# Patient Record
Sex: Female | Born: 1975
Health system: Southern US, Community
[De-identification: ages and names within clinical notes are randomized; demographics above are authoritative.]

## PROBLEM LIST (undated history)

## (undated) ENCOUNTER — Inpatient Hospital Stay (HOSPITAL_COMMUNITY): Payer: Self-pay

## (undated) DIAGNOSIS — I615 Nontraumatic intracerebral hemorrhage, intraventricular: Secondary | ICD-10-CM

## (undated) DIAGNOSIS — G40901 Epilepsy, unspecified, not intractable, with status epilepticus: Secondary | ICD-10-CM

## (undated) DIAGNOSIS — I639 Cerebral infarction, unspecified: Secondary | ICD-10-CM

## (undated) DIAGNOSIS — G08 Intracranial and intraspinal phlebitis and thrombophlebitis: Secondary | ICD-10-CM

## (undated) DIAGNOSIS — I619 Nontraumatic intracerebral hemorrhage, unspecified: Secondary | ICD-10-CM

## (undated) DIAGNOSIS — R569 Unspecified convulsions: Secondary | ICD-10-CM

## (undated) DIAGNOSIS — F419 Anxiety disorder, unspecified: Secondary | ICD-10-CM

## (undated) DIAGNOSIS — F329 Major depressive disorder, single episode, unspecified: Secondary | ICD-10-CM

## (undated) DIAGNOSIS — G936 Cerebral edema: Secondary | ICD-10-CM

## (undated) HISTORY — DX: Nontraumatic intracerebral hemorrhage, intraventricular: I61.5

## (undated) HISTORY — DX: Anxiety disorder, unspecified: F41.9

## (undated) HISTORY — DX: Cerebral infarction, unspecified: I63.9

## (undated) HISTORY — DX: Unspecified convulsions: R56.9

## (undated) HISTORY — DX: Major depressive disorder, single episode, unspecified: F32.9

## (undated) HISTORY — DX: Cerebral edema: G93.6

## (undated) HISTORY — DX: Epilepsy, unspecified, not intractable, with status epilepticus: G40.901

## (undated) HISTORY — DX: Intracranial and intraspinal phlebitis and thrombophlebitis: G08

## (undated) HISTORY — PX: DILATION AND CURETTAGE OF UTERUS: SHX78

## (undated) HISTORY — PX: COLONOSCOPY: SHX174

---

## 2013-01-06 ENCOUNTER — Emergency Department (HOSPITAL_COMMUNITY)
Admission: EM | Admit: 2013-01-06 | Discharge: 2013-01-07 | Disposition: A | Discharge status: Home / Self Care | Payer: Managed Care, Other (non HMO) | Attending: Emergency Medicine | Admitting: Emergency Medicine

## 2013-01-06 ENCOUNTER — Encounter (HOSPITAL_COMMUNITY): Payer: Self-pay | Admitting: Emergency Medicine

## 2013-01-06 DIAGNOSIS — Y92410 Unspecified street and highway as the place of occurrence of the external cause: Secondary | ICD-10-CM | POA: Insufficient documentation

## 2013-01-06 DIAGNOSIS — IMO0002 Reserved for concepts with insufficient information to code with codable children: Secondary | ICD-10-CM | POA: Insufficient documentation

## 2013-01-06 DIAGNOSIS — T192XXA Foreign body in vulva and vagina, initial encounter: Secondary | ICD-10-CM | POA: Insufficient documentation

## 2013-01-06 DIAGNOSIS — Y9389 Activity, other specified: Secondary | ICD-10-CM | POA: Insufficient documentation

## 2013-01-06 DIAGNOSIS — Z79899 Other long term (current) drug therapy: Secondary | ICD-10-CM | POA: Insufficient documentation

## 2013-01-06 NOTE — ED Notes (Signed)
Pt states she thinks she has a tampon in and is unable to find it to take it out

## 2013-01-07 NOTE — ED Notes (Signed)
MD at bedside. 

## 2013-01-07 NOTE — ED Provider Notes (Signed)
History     CSN: [id number]  Arrival date & time 01/06/13  2221   First MD Initiated Contact with Patient 01/07/13 0004      Chief Complaint  Patient presents with  . Pelvic Pain    (Consider location/radiation/quality/duration/timing/severity/associated sxs/prior treatment) HPI Comments: Patient presents with possible vaginal foreign body. She states that she was having anal sex and a tampon was in place in the vagina. She states that during anal sex her partner's penis slipped into the vagina and shaking she pushed the tampon and she has not been located tampon. This happened earlier today. She denies any abdominal pain or pelvic pain. She denies a vaginal discharge. Her period has started today and was normal for her. She denies he nausea vomiting. She denies he fevers or chills.  Patient is a 37 y.o. female presenting with pelvic pain.  Pelvic Pain Pertinent negatives include no abdominal pain.    History reviewed. No pertinent past medical history.  Past Surgical History  Procedure Laterality Date  . Dilation and curettage of uterus      Family History  Problem Relation Age of Onset  . Cancer Other     History  Substance Use Topics  . Smoking status: Never Smoker   . Smokeless tobacco: Not on file  . Alcohol Use: Yes    OB History   Grav Para Term Preterm Abortions TAB SAB Ect Mult Living                  Review of Systems  Constitutional: Negative for fever.  Gastrointestinal: Negative for nausea, vomiting and abdominal pain.  Genitourinary: Positive for vaginal bleeding. Negative for vaginal discharge, difficulty urinating, vaginal pain and pelvic pain.    Allergies  Review of patient's allergies indicates no known allergies.  Home Medications   Current Outpatient Rx  Name  Route  Sig  Dispense  Refill  . Multiple Vitamin (MULTIVITAMIN) tablet   Oral   Take 1 tablet by mouth daily.         Marland Kitchen sulfamethoxazole-trimethoprim (BACTRIM,SEPTRA) 400-80  MG per tablet   Oral   Take 1 tablet by mouth 2 (two) times daily.           BP 97/59  Pulse 75  Temp(Src) 97.9 F (36.6 C) (Oral)  Resp 20  SpO2 100%  LMP 01/06/2013  Physical Exam  Constitutional: She is oriented to person, place, and time. She appears well-developed and well-nourished.  Cardiovascular: Normal rate.   Pulmonary/Chest: Effort normal and breath sounds normal.  Abdominal: Soft. She exhibits no distension. There is no tenderness. There is no guarding.  Genitourinary:  On pelvic exam a tampon was discovered and was removed with ring forceps. There is no noticeable vaginal discharge or abnormal bleeding. There is no tenderness on bimanual exam.  Neurological: She is alert and oriented to person, place, and time.  Skin: Skin is warm and dry.    ED Course  Procedures (including critical care time)  Labs Reviewed - No data to display No results found.   1. Vaginal foreign body, initial encounter       MDM  Tampon was removed and sent. Patient is advised to return if she has any increased discharge or abdominal pain.        Rolan Bucco, MD 01/07/13 [phone]

## 2014-10-13 NOTE — L&D Delivery Note (Signed)
Delivery Note At 4:22 PM a viable and healthy female was delivered via  (Presentation: Right Occiput; Anterior ).  APGAR: 7, 9; weight pending  .   Placenta status: Intact, Spontaneous.  Cord:  with the following complications: loose nuchal cord x 1  Anesthesia:  Epidural Episiotomy:  None Lacerations:  None Suture Repair: na  Est. Blood Loss (mL):  minimal  Mom to postpartum.  Baby to Couplet care / Skin to Skin.  Nera Haworth H. 05/07/2015, 4:44 PM

## 2014-11-21 ENCOUNTER — Inpatient Hospital Stay (HOSPITAL_COMMUNITY)
Admission: AD | Admit: 2014-11-21 | Discharge: 2014-11-21 | Disposition: A | Discharge status: Home / Self Care | Payer: BLUE CROSS/BLUE SHIELD | Source: Ambulatory Visit | Attending: Obstetrics and Gynecology | Admitting: Obstetrics and Gynecology

## 2014-11-21 ENCOUNTER — Encounter (HOSPITAL_COMMUNITY): Payer: Self-pay | Admitting: *Deleted

## 2014-11-21 DIAGNOSIS — E876 Hypokalemia: Secondary | ICD-10-CM | POA: Diagnosis not present

## 2014-11-21 DIAGNOSIS — O219 Vomiting of pregnancy, unspecified: Secondary | ICD-10-CM | POA: Diagnosis not present

## 2014-11-21 DIAGNOSIS — R42 Dizziness and giddiness: Secondary | ICD-10-CM | POA: Diagnosis not present

## 2014-11-21 DIAGNOSIS — O210 Mild hyperemesis gravidarum: Secondary | ICD-10-CM | POA: Diagnosis not present

## 2014-11-21 DIAGNOSIS — O09522 Supervision of elderly multigravida, second trimester: Secondary | ICD-10-CM | POA: Diagnosis not present

## 2014-11-21 DIAGNOSIS — Z3A15 15 weeks gestation of pregnancy: Secondary | ICD-10-CM | POA: Diagnosis not present

## 2014-11-21 DIAGNOSIS — O99282 Endocrine, nutritional and metabolic diseases complicating pregnancy, second trimester: Secondary | ICD-10-CM | POA: Diagnosis not present

## 2014-11-21 LAB — COMPREHENSIVE METABOLIC PANEL
ALK PHOS: 41 U/L (ref 39–117)
ALT: 15 U/L (ref 0–35)
ANION GAP: 5 (ref 5–15)
AST: 23 U/L (ref 0–37)
Albumin: 3.2 g/dL — ABNORMAL LOW (ref 3.5–5.2)
BILIRUBIN TOTAL: 0.6 mg/dL (ref 0.3–1.2)
BUN: 13 mg/dL (ref 6–23)
CHLORIDE: 103 mmol/L (ref 96–112)
CO2: 24 mmol/L (ref 19–32)
Calcium: 8.4 mg/dL (ref 8.4–10.5)
Creatinine, Ser: 0.36 mg/dL — ABNORMAL LOW (ref 0.50–1.10)
GFR calc non Af Amer: >90 mL/min (ref >90)
GLUCOSE: 89 mg/dL (ref 70–99)
POTASSIUM: 3 mmol/L — AB (ref 3.5–5.1)
SODIUM: 132 mmol/L — AB (ref 135–145)
TOTAL PROTEIN: 5.9 g/dL — AB (ref 6.0–8.3)

## 2014-11-21 LAB — URINALYSIS, ROUTINE W REFLEX MICROSCOPIC
Glucose, UA: NEGATIVE mg/dL
HGB URINE DIPSTICK: NEGATIVE
Ketones, ur: >80 mg/dL — AB
Leukocytes, UA: NEGATIVE
Nitrite: NEGATIVE
PROTEIN: NEGATIVE mg/dL
Specific Gravity, Urine: 1.025 (ref 1.005–1.030)
UROBILINOGEN UA: 0.2 mg/dL (ref 0.0–1.0)
pH: 6.5 (ref 5.0–8.0)

## 2014-11-21 LAB — OB RESULTS CONSOLE HEPATITIS B SURFACE ANTIGEN: Hepatitis B Surface Ag: NEGATIVE

## 2014-11-21 LAB — CBC
HEMATOCRIT: 33.5 % — AB (ref 36.0–46.0)
Hemoglobin: 11.8 g/dL — ABNORMAL LOW (ref 12.0–15.0)
MCH: 31.6 pg (ref 26.0–34.0)
MCHC: 35.2 g/dL (ref 30.0–36.0)
MCV: 89.8 fL (ref 78.0–100.0)
PLATELETS: 217 10*3/uL (ref 150–400)
RBC: 3.73 MIL/uL — AB (ref 3.87–5.11)
RDW: 12.6 % (ref 11.5–15.5)
WBC: 6.6 10*3/uL (ref 4.0–10.5)

## 2014-11-21 LAB — OB RESULTS CONSOLE GC/CHLAMYDIA
CHLAMYDIA, DNA PROBE: NEGATIVE
GC PROBE AMP, GENITAL: NEGATIVE

## 2014-11-21 LAB — OB RESULTS CONSOLE ANTIBODY SCREEN: Antibody Screen: NEGATIVE

## 2014-11-21 LAB — OB RESULTS CONSOLE RUBELLA ANTIBODY, IGM: RUBELLA: NON-IMMUNE/NOT IMMUNE

## 2014-11-21 LAB — OB RESULTS CONSOLE RPR: RPR: NONREACTIVE

## 2014-11-21 LAB — OB RESULTS CONSOLE ABO/RH: RH TYPE: NEGATIVE

## 2014-11-21 LAB — OB RESULTS CONSOLE HIV ANTIBODY (ROUTINE TESTING): HIV: NONREACTIVE

## 2014-11-21 MED ORDER — DEXTROSE 5 % IN LACTATED RINGERS IV BOLUS
1000.0000 mL | Freq: Once | INTRAVENOUS | Status: AC
  Administered 2014-11-21: 1000 mL via INTRAVENOUS

## 2014-11-21 MED ORDER — POTASSIUM CHLORIDE CRYS ER 20 MEQ PO TBCR
30.0000 meq | EXTENDED_RELEASE_TABLET | Freq: Once | ORAL | Status: AC
  Administered 2014-11-21: 30 meq via ORAL
  Filled 2014-11-21: qty 1

## 2014-11-21 MED ORDER — METOCLOPRAMIDE HCL 5 MG/ML IJ SOLN
10.0000 mg | Freq: Once | INTRAMUSCULAR | Status: AC
  Administered 2014-11-21: 10 mg via INTRAVENOUS
  Filled 2014-11-21: qty 2

## 2014-11-21 MED ORDER — SODIUM CHLORIDE 0.9 % IV SOLN
Freq: Once | INTRAVENOUS | Status: AC
  Administered 2014-11-21: 19:00:00 via INTRAVENOUS
  Filled 2014-11-21: qty 1000

## 2014-11-21 NOTE — MAU Note (Signed)
PT SAYS  SHE HAS NOT BEEN VOMITING  ALL OF PREG-  STARTED AT 8 PM LAST NIGHT .  SHE CHANGED DR'S  FROM [facility] TO [place]-  APPOINTMENT  TODAY  WAS WITH DR ROSS-  GAVE HER PHENERGAN  - ORAL AND SUPP.      TOOK  PO AT 1115-   HELPED SOME.  - NO VOMITING - BUT NAUSEA.  .  NO Diarrhea.      LOWER RIGHT CRAMPS  - TODAY.    TODAY IN OFFICE - HEARD [facility].

## 2014-11-21 NOTE — MAU Note (Addendum)
PO SPRITE  AND GRAHAM CRACKERS  TO B-ROOM-- NO DIZZINESS

## 2014-11-21 NOTE — MAU Provider Note (Signed)
Chief Complaint: Emesis   First Provider Initiated Contact with Patient 11/21/14 1726     SUBJECTIVE HPI: Ashley [name] is a 39 y.o. [id number] at [redacted]w[redacted]d by LMP who presents to maternity admissions reporting onset of nausea/vomiting last night making it difficult to keep down any food or fluids. She saw Dr Harrington Challenger for initial OB visit today and was prescribed Phenergan. She took a dose of Phenergan at 11:30 am and has not vomited since that time but continues to feel nauseous and is unable to drink fluids or eat.  She also started to have right lower abdominal pain that is mild today after her prenatal visit.  She denies vaginal bleeding, vaginal itching/burning, urinary symptoms, h/a, dizziness, n/v, or fever/chills.    History reviewed. No pertinent past medical history. Past Surgical History  Procedure Laterality Date  . Dilation and curettage of uterus     History   Social History  . Marital Status: Married    Spouse Name: N/A    Number of Children: N/A  . Years of Education: N/A   Occupational History  . Not on file.   Social History Main Topics  . Smoking status: Never Smoker   . Smokeless tobacco: Not on file  . Alcohol Use: Yes     Comment: NONE  WHILE  PREG  . Drug Use: No  . Sexual Activity: Not on file   Other Topics Concern  . Not on file   Social History Narrative   No current facility-administered medications on file prior to encounter.   No current outpatient prescriptions on file prior to encounter.   No Known Allergies  ROS: Pertinent items in HPI  OBJECTIVE Blood pressure 75/44, pulse 80, temperature 98.1 F (36.7 C), temperature source Oral, resp. rate 18, height 3' 0.5" (0.927 m), weight 63.617 kg (140 lb 4 oz), SpO2 100 %.   Patient Vitals for the past 24 hrs:  BP Temp Temp src Pulse Resp SpO2 Height Weight  11/21/14 2004 (!) 84/37 mmHg - - 83 - - - -  11/21/14 2002 (!) 82/34 mmHg - - 76 - - - -  11/21/14 2001 (!) 92/46 mmHg - - 70 - - - -  11/21/14  1652 (!) 75/44 mmHg 98.1 F (36.7 C) Oral 80 18 100 % 3' 0.5" (0.927 m) 63.617 kg (140 lb 4 oz)     GENERAL: Well-developed, well-nourished female in no acute distress.  HEENT: Normocephalic HEART: normal rate RESP: normal effort ABDOMEN: Soft, mild RLQ tenderness, no rebound tenderness or guarding EXTREMITIES: Nontender, no edema NEURO: Alert and oriented   LAB RESULTS Results for orders placed or performed during the hospital encounter of 11/21/14 (from the past 24 hour(s))  Urinalysis, Routine w reflex microscopic     Status: Abnormal   Collection Time: 11/21/14  5:00 PM  Result Value Ref Range   Color, Urine AMBER (A) YELLOW   APPearance HAZY (A) CLEAR   Specific Gravity, Urine 1.025 1.005 - 1.030   pH 6.5 5.0 - 8.0   Glucose, UA NEGATIVE NEGATIVE mg/dL   Hgb urine dipstick NEGATIVE NEGATIVE   Bilirubin Urine SMALL (A) NEGATIVE   Ketones, ur >80 (A) NEGATIVE mg/dL   Protein, ur NEGATIVE NEGATIVE mg/dL   Urobilinogen, UA 0.2 0.0 - 1.0 mg/dL   Nitrite NEGATIVE NEGATIVE   Leukocytes, UA NEGATIVE NEGATIVE  CBC     Status: Abnormal   Collection Time: 11/21/14  5:40 PM  Result Value Ref Range   WBC 6.6 4.0 -  10.5 K/uL   RBC 3.73 (L) 3.87 - 5.11 MIL/uL   Hemoglobin 11.8 (L) 12.0 - 15.0 g/dL   HCT 33.5 (L) 36.0 - 46.0 %   MCV 89.8 78.0 - 100.0 fL   MCH 31.6 26.0 - 34.0 pg   MCHC 35.2 30.0 - 36.0 g/dL   RDW 12.6 11.5 - 15.5 %   Platelets 217 150 - 400 K/uL   MAU Management: D5LR x 1000 ml, Reglan 10 mg IV, NS with 10 meq of K, 30 meq of K tablet in MAU Pt reports complete relief of nausea and is able to tolerate PO fluids and crackers Pt walked to bathroom x 2 and denies dizziness.  She reports her BP usually runs about 80/40.  Assessment:  1. Nausea and vomiting during pregnancy prior to [redacted] weeks gestation   2. Dizziness   3. Hypokalemia due to inadequate potassium intake     Plan:  Consult Dr Rogue Bussing D/C home F/U in office as scheduled Return to MAU as needed  for emergencies     Medication List    ASK your doctor about these medications        acetaminophen 500 MG tablet  Commonly known as:  TYLENOL  Take 1,000 mg by mouth every 6 (six) hours as needed for headache.     ampicillin 500 MG capsule  Commonly known as:  PRINCIPEN  Take 500 mg by mouth daily.     FLUoxetine 20 MG capsule  Commonly known as:  PROZAC  Take 20 mg by mouth daily.     prenatal multivitamin Tabs tablet  Take 1 tablet by mouth daily at 12 noon.     promethazine 25 MG tablet  Commonly known as:  PHENERGAN  Take 25 mg by mouth every 6 (six) hours as needed for nausea or vomiting.         Fatima Blank Certified Nurse-Midwife 11/21/2014  5:54 PM

## 2014-11-21 NOTE — MAU Note (Signed)
SAYS FEELS  BETTER-   OFFERED SPRITE   OR GINGERALE-  DECLINED-  [place].

## 2014-11-21 NOTE — Discharge Instructions (Signed)
Morning Sickness Morning sickness is when you feel sick to your stomach (nauseous) during pregnancy. This nauseous feeling may or may not come with vomiting. It often occurs in the morning but can be a problem any time of day. Morning sickness is most common during the first trimester, but it may continue throughout pregnancy. While morning sickness is unpleasant, it is usually harmless unless you develop severe and continual vomiting (hyperemesis gravidarum). This condition requires more intense treatment.  CAUSES  The cause of morning sickness is not completely known but seems to be related to normal hormonal changes that occur in pregnancy. RISK FACTORS You are at greater risk if you:  Experienced nausea or vomiting before your pregnancy.  Had morning sickness during a previous pregnancy.  Are pregnant with more than one baby, such as twins. TREATMENT  Do not use any medicines (prescription, over-the-counter, or herbal) for morning sickness without first talking to your health care provider. Your health care provider may prescribe or recommend:  Vitamin B6 supplements.  Anti-nausea medicines.  The herbal medicine ginger. HOME CARE INSTRUCTIONS   Only take over-the-counter or prescription medicines as directed by your health care provider.  Taking multivitamins before getting pregnant can prevent or decrease the severity of morning sickness in most women.  Eat a piece of dry toast or unsalted crackers before getting out of bed in the morning.  Eat five or six small meals a day.  Eat dry and bland foods (rice, baked potato). Foods high in carbohydrates are often helpful.  Do not drink liquids with your meals. Drink liquids between meals.  Avoid greasy, fatty, and spicy foods.  Get someone to cook for you if the smell of any food causes nausea and vomiting.  If you feel nauseous after taking prenatal vitamins, take the vitamins at night or with a snack.  Snack on protein  foods (nuts, yogurt, cheese) between meals if you are hungry.  Eat unsweetened gelatins for desserts.  Wearing an acupressure wristband (worn for sea sickness) may be helpful.  Acupuncture may be helpful.  Do not smoke.  Get a humidifier to keep the air in your house free of odors.  Get plenty of fresh air. SEEK MEDICAL CARE IF:   Your home remedies are not working, and you need medicine.  You feel dizzy or lightheaded.  You are losing weight. SEEK IMMEDIATE MEDICAL CARE IF:   You have persistent and uncontrolled nausea and vomiting.  You pass out (faint). MAKE SURE YOU:  Understand these instructions.  Will watch your condition.  Will get help right away if you are not doing well or get worse. Document Released: 11/20/2006 Document Revised: 10/04/2013 Document Reviewed: 03/16/2013 [facility] Patient Information 2015 [place], [place]. This information is not intended to replace advice given to you by your health care provider. Make sure you discuss any questions you have with your health care provider. Viral Gastroenteritis Viral gastroenteritis is also known as stomach flu. This condition affects the stomach and intestinal tract. It can cause sudden diarrhea and vomiting. The illness typically lasts 3 to 8 days. Most people develop an immune response that eventually gets rid of the virus. While this natural response develops, the virus can make you quite ill. CAUSES  Many different viruses can cause gastroenteritis, such as rotavirus or noroviruses. You can catch one of these viruses by consuming contaminated food or water. You may also catch a virus by sharing utensils or other personal items with an infected person or by touching a  contaminated surface. SYMPTOMS  The most common symptoms are diarrhea and vomiting. These problems can cause a severe loss of body fluids (dehydration) and a body salt (electrolyte) imbalance. Other symptoms may  include:  Fever.  Headache.  Fatigue.  Abdominal pain. DIAGNOSIS  Your caregiver can usually diagnose viral gastroenteritis based on your symptoms and a physical exam. A stool sample may also be taken to test for the presence of viruses or other infections. TREATMENT  This illness typically goes away on its own. Treatments are aimed at rehydration. The most serious cases of viral gastroenteritis involve vomiting so severely that you are not able to keep fluids down. In these cases, fluids must be given through an intravenous line (IV). HOME CARE INSTRUCTIONS   Drink enough fluids to keep your urine clear or pale yellow. Drink small amounts of fluids frequently and increase the amounts as tolerated.  Ask your caregiver for specific rehydration instructions.  Avoid:  Foods high in sugar.  Alcohol.  Carbonated drinks.  Tobacco.  Juice.  Caffeine drinks.  Extremely hot or cold fluids.  Fatty, greasy foods.  Too much intake of anything at one time.  Dairy products until 24 to 48 hours after diarrhea stops.  You may consume probiotics. Probiotics are active cultures of beneficial bacteria. They may lessen the amount and number of diarrheal stools in adults. Probiotics can be found in yogurt with active cultures and in supplements.  Wash your hands well to avoid spreading the virus.  Only take over-the-counter or prescription medicines for pain, discomfort, or fever as directed by your caregiver. Do not give aspirin to children. Antidiarrheal medicines are not recommended.  Ask your caregiver if you should continue to take your regular prescribed and over-the-counter medicines.  Keep all follow-up appointments as directed by your caregiver. SEEK IMMEDIATE MEDICAL CARE IF:   You are unable to keep fluids down.  You do not urinate at least once every 6 to 8 hours.  You develop shortness of breath.  You notice blood in your stool or vomit. This may look like coffee  grounds.  You have abdominal pain that increases or is concentrated in one small area (localized).  You have persistent vomiting or diarrhea.  You have a fever.  The patient is a child younger than 3 months, and he or she has a fever.  The patient is a child older than 3 months, and he or she has a fever and persistent symptoms.  The patient is a child older than 3 months, and he or she has a fever and symptoms suddenly get worse.  The patient is a baby, and he or she has no tears when crying. MAKE SURE YOU:   Understand these instructions.  Will watch your condition.  Will get help right away if you are not doing well or get worse. Document Released: 09/29/2005 Document Revised: 12/22/2011 Document Reviewed: 07/16/2011 [facility] Patient Information 2015 [place], [place]. This information is not intended to replace advice given to you by your health care provider. Make sure you discuss any questions you have with your health care provider.

## 2014-11-21 NOTE — MAU Note (Addendum)
Got sick last night. Through up all night. Can't eat, can't drink.  Feeling light headed.(unsteady when walking and taking off coat) Not keeping anything down.  Saw MD this morning, was given option of coming over for fluids, chose to go home, has not thrown up since 0600, is very nauseated. No fever, no diarrhea.

## 2015-04-20 LAB — OB RESULTS CONSOLE GBS: GBS: NEGATIVE

## 2015-05-01 ENCOUNTER — Encounter (HOSPITAL_COMMUNITY): Payer: Self-pay | Admitting: *Deleted

## 2015-05-01 ENCOUNTER — Telehealth (HOSPITAL_COMMUNITY): Payer: Self-pay | Admitting: *Deleted

## 2015-05-01 NOTE — Telephone Encounter (Signed)
Preadmission screen  

## 2015-05-07 ENCOUNTER — Inpatient Hospital Stay (HOSPITAL_COMMUNITY): Payer: BLUE CROSS/BLUE SHIELD | Admitting: Anesthesiology

## 2015-05-07 ENCOUNTER — Encounter (HOSPITAL_COMMUNITY): Payer: Self-pay

## 2015-05-07 ENCOUNTER — Inpatient Hospital Stay (HOSPITAL_COMMUNITY)
Admission: RE | Admit: 2015-05-07 | Discharge: 2015-05-09 | DRG: 775 | Disposition: A | Discharge status: Home / Self Care | Payer: BLUE CROSS/BLUE SHIELD | Source: Ambulatory Visit | Attending: Obstetrics and Gynecology | Admitting: Obstetrics and Gynecology

## 2015-05-07 DIAGNOSIS — O09523 Supervision of elderly multigravida, third trimester: Secondary | ICD-10-CM | POA: Diagnosis present

## 2015-05-07 DIAGNOSIS — Z3490 Encounter for supervision of normal pregnancy, unspecified, unspecified trimester: Secondary | ICD-10-CM

## 2015-05-07 DIAGNOSIS — Z3A39 39 weeks gestation of pregnancy: Secondary | ICD-10-CM | POA: Diagnosis present

## 2015-05-07 DIAGNOSIS — O9989 Other specified diseases and conditions complicating pregnancy, childbirth and the puerperium: Secondary | ICD-10-CM | POA: Diagnosis present

## 2015-05-07 LAB — CBC
HCT: 32.5 % — ABNORMAL LOW (ref 36.0–46.0)
Hemoglobin: 10.7 g/dL — ABNORMAL LOW (ref 12.0–15.0)
MCH: 29.6 pg (ref 26.0–34.0)
MCHC: 32.9 g/dL (ref 30.0–36.0)
MCV: 90 fL (ref 78.0–100.0)
Platelets: 225 10*3/uL (ref 150–400)
RBC: 3.61 MIL/uL — ABNORMAL LOW (ref 3.87–5.11)
RDW: 14.8 % (ref 11.5–15.5)
WBC: 7.7 10*3/uL (ref 4.0–10.5)

## 2015-05-07 LAB — RPR: RPR Ser Ql: NONREACTIVE

## 2015-05-07 MED ORDER — OXYTOCIN BOLUS FROM INFUSION
500.0000 mL | INTRAVENOUS | Status: DC
  Administered 2015-05-07: 500 mL via INTRAVENOUS

## 2015-05-07 MED ORDER — EPHEDRINE 5 MG/ML INJ
10.0000 mg | INTRAVENOUS | Status: DC | PRN
  Filled 2015-05-07: qty 2

## 2015-05-07 MED ORDER — BENZOCAINE-MENTHOL 20-0.5 % EX AERO: 1.0000 "application " | INHALATION_SPRAY | CUTANEOUS | Status: DC | PRN

## 2015-05-07 MED ORDER — FENTANYL 2.5 MCG/ML BUPIVACAINE 1/10 % EPIDURAL INFUSION (WH - ANES): 14.0000 mL/h | INTRAMUSCULAR | Status: DC | PRN

## 2015-05-07 MED ORDER — LANOLIN HYDROUS EX OINT: TOPICAL_OINTMENT | CUTANEOUS | Status: DC | PRN

## 2015-05-07 MED ORDER — ZOLPIDEM TARTRATE 5 MG PO TABS
5.0000 mg | ORAL_TABLET | Freq: Every evening | ORAL | Status: DC | PRN
Start: 2015-05-07 — End: 2015-05-09

## 2015-05-07 MED ORDER — LACTATED RINGERS IV SOLN
INTRAVENOUS | Status: DC
  Administered 2015-05-07 (×2): via INTRAVENOUS

## 2015-05-07 MED ORDER — OXYTOCIN 40 UNITS IN LACTATED RINGERS INFUSION - SIMPLE MED
1.0000 m[IU]/min | INTRAVENOUS | Status: DC
  Administered 2015-05-07: 2 m[IU]/min via INTRAVENOUS
  Filled 2015-05-07: qty 1000

## 2015-05-07 MED ORDER — SIMETHICONE 80 MG PO CHEW: 80.0000 mg | CHEWABLE_TABLET | ORAL | Status: DC | PRN

## 2015-05-07 MED ORDER — LIDOCAINE HCL (PF) 1 % IJ SOLN
INTRAMUSCULAR | Status: DC | PRN
  Administered 2015-05-07: 4 mL
  Administered 2015-05-07: 6 mL via EPIDURAL

## 2015-05-07 MED ORDER — DIBUCAINE 1 % RE OINT: 1.0000 "application " | TOPICAL_OINTMENT | RECTAL | Status: DC | PRN

## 2015-05-07 MED ORDER — DIPHENHYDRAMINE HCL 50 MG/ML IJ SOLN: 12.5000 mg | INTRAMUSCULAR | Status: DC | PRN

## 2015-05-07 MED ORDER — SENNOSIDES-DOCUSATE SODIUM 8.6-50 MG PO TABS
2.0000 | ORAL_TABLET | ORAL | Status: DC
  Administered 2015-05-07 – 2015-05-08 (×2): 2 via ORAL
  Filled 2015-05-07 (×2): qty 2

## 2015-05-07 MED ORDER — PRENATAL MULTIVITAMIN CH
1.0000 | ORAL_TABLET | Freq: Every day | ORAL | Status: DC
  Administered 2015-05-08 – 2015-05-09 (×2): 1 via ORAL
  Filled 2015-05-07 (×2): qty 1

## 2015-05-07 MED ORDER — WITCH HAZEL-GLYCERIN EX PADS: 1.0000 "application " | MEDICATED_PAD | CUTANEOUS | Status: DC | PRN

## 2015-05-07 MED ORDER — TETANUS-DIPHTH-ACELL PERTUSSIS 5-2.5-18.5 LF-MCG/0.5 IM SUSP: 0.5000 mL | Freq: Once | INTRAMUSCULAR | Status: DC

## 2015-05-07 MED ORDER — IBUPROFEN 600 MG PO TABS
600.0000 mg | ORAL_TABLET | Freq: Four times a day (QID) | ORAL | Status: DC
Start: 2015-05-07 — End: 2015-05-09
  Administered 2015-05-07 – 2015-05-09 (×8): 600 mg via ORAL
  Filled 2015-05-07 (×8): qty 1

## 2015-05-07 MED ORDER — OXYCODONE-ACETAMINOPHEN 5-325 MG PO TABS: 1.0000 | ORAL_TABLET | ORAL | Status: DC | PRN

## 2015-05-07 MED ORDER — PHENYLEPHRINE 40 MCG/ML (10ML) SYRINGE FOR IV PUSH (FOR BLOOD PRESSURE SUPPORT)
80.0000 ug | PREFILLED_SYRINGE | INTRAVENOUS | Status: DC | PRN
  Filled 2015-05-07: qty 20
  Filled 2015-05-07: qty 2

## 2015-05-07 MED ORDER — LACTATED RINGERS IV SOLN
500.0000 mL | INTRAVENOUS | Status: DC | PRN
  Administered 2015-05-07: 500 mL via INTRAVENOUS

## 2015-05-07 MED ORDER — OXYTOCIN 40 UNITS IN LACTATED RINGERS INFUSION - SIMPLE MED
62.5000 mL/h | INTRAVENOUS | Status: DC
  Administered 2015-05-07: 62.5 mL/h via INTRAVENOUS

## 2015-05-07 MED ORDER — ACETAMINOPHEN 325 MG PO TABS
650.0000 mg | ORAL_TABLET | ORAL | Status: DC | PRN
Start: 2015-05-07 — End: 2015-05-09
  Administered 2015-05-08 (×3): 650 mg via ORAL
  Filled 2015-05-07 (×3): qty 2

## 2015-05-07 MED ORDER — ONDANSETRON HCL 4 MG/2ML IJ SOLN: 4.0000 mg | INTRAMUSCULAR | Status: DC | PRN

## 2015-05-07 MED ORDER — OXYCODONE-ACETAMINOPHEN 5-325 MG PO TABS: 2.0000 | ORAL_TABLET | ORAL | Status: DC | PRN

## 2015-05-07 MED ORDER — ONDANSETRON HCL 4 MG/2ML IJ SOLN: 4.0000 mg | Freq: Four times a day (QID) | INTRAMUSCULAR | Status: DC | PRN

## 2015-05-07 MED ORDER — ZOLPIDEM TARTRATE 5 MG PO TABS: 5.0000 mg | ORAL_TABLET | Freq: Every evening | ORAL | Status: DC | PRN

## 2015-05-07 MED ORDER — FENTANYL 2.5 MCG/ML BUPIVACAINE 1/10 % EPIDURAL INFUSION (WH - ANES)
14.0000 mL/h | INTRAMUSCULAR | Status: DC | PRN
  Administered 2015-05-07: 14 mL/h via EPIDURAL
  Filled 2015-05-07: qty 125

## 2015-05-07 MED ORDER — BUTORPHANOL TARTRATE 1 MG/ML IJ SOLN
1.0000 mg | INTRAMUSCULAR | Status: DC | PRN
Start: 2015-05-07 — End: 2015-05-07

## 2015-05-07 MED ORDER — CITRIC ACID-SODIUM CITRATE 334-500 MG/5ML PO SOLN: 30.0000 mL | ORAL | Status: DC | PRN

## 2015-05-07 MED ORDER — LIDOCAINE HCL (PF) 1 % IJ SOLN
30.0000 mL | INTRAMUSCULAR | Status: DC | PRN
  Filled 2015-05-07: qty 30

## 2015-05-07 MED ORDER — TERBUTALINE SULFATE 1 MG/ML IJ SOLN
0.2500 mg | Freq: Once | INTRAMUSCULAR | Status: DC | PRN
  Filled 2015-05-07: qty 1

## 2015-05-07 MED ORDER — ACETAMINOPHEN 325 MG PO TABS: 650.0000 mg | ORAL_TABLET | ORAL | Status: DC | PRN

## 2015-05-07 MED ORDER — ONDANSETRON HCL 4 MG PO TABS
4.0000 mg | ORAL_TABLET | ORAL | Status: DC | PRN
Start: 2015-05-07 — End: 2015-05-09

## 2015-05-07 MED ORDER — DIPHENHYDRAMINE HCL 25 MG PO CAPS: 25.0000 mg | ORAL_CAPSULE | Freq: Four times a day (QID) | ORAL | Status: DC | PRN

## 2015-05-07 NOTE — H&P (Signed)
Ashley [name] is a 39 y.o. female presenting for IOL  39 yo [id number] @ 39+2 presents to IOL for term pregnancy. Her pregnancy has been complicated by AMA, NIPT low risk History OB History    Gravida Para Term Preterm AB TAB SAB Ectopic Multiple Living   9 2 2  6 2 3   0 2     Past Medical History  Diagnosis Date  . Anxiety    Past Surgical History  Procedure Laterality Date  . Dilation and curettage of uterus     Family History: family history includes Cancer in her father and mother. Social History:  reports that she has never smoked. She has never used smokeless tobacco. She reports that she drinks alcohol. She reports that she does not use illicit drugs.   Prenatal Transfer Tool  Maternal Diabetes: No Genetic Screening: Normal Maternal Ultrasounds/Referrals: Normal Fetal Ultrasounds or other Referrals:  None Maternal Substance Abuse:  No Significant Maternal Medications:  None Significant Maternal Lab Results:  None Other Comments:  None  ROS    Blood pressure 99/55, pulse 82, temperature 98.4 F (36.9 C), temperature source Oral, resp. rate 20, height 5\' 3"  (1.6 m), weight 73.483 kg (162 lb). Exam Physical Exam  Prenatal labs: ABO, Rh: O/Negative/-- (02/09 0000) Antibody: Negative (02/09 0000) Rubella: Nonimmune (02/09 0000) RPR: Nonreactive (02/09 0000)  HBsAg: Negative (02/09 0000)  HIV: Non-reactive (02/09 0000)  GBS: Negative (07/08 0000)   Assessment/Plan: 1) Admit 2) Epidural on request 3) Pit for augmentation    Ashley [name] H. 05/07/2015, 8:55 AM

## 2015-05-07 NOTE — Anesthesia Procedure Notes (Signed)
Epidural Patient location during procedure: OB  Preanesthetic Checklist Completed: patient identified, site marked, surgical consent, pre-op evaluation, timeout performed, IV checked, risks and benefits discussed and monitors and equipment checked  Epidural Patient position: sitting Prep: site prepped and draped and DuraPrep Patient monitoring: continuous pulse ox and blood pressure Approach: midline Location: L3-L4 Injection technique: LOR air  Needle:  Needle type: Tuohy  Needle gauge: 17 G Needle length: 9 cm and 9 Needle insertion depth: 5 cm cm Catheter type: closed end flexible Catheter size: 19 Gauge Catheter at skin depth: 10 cm Test dose: negative  Assessment Events: blood not aspirated, injection not painful, no injection resistance, negative IV test and no paresthesia  Additional Notes Dosing of Epidural:  1st dose, through catheter ............................................Marland Kitchen  Xylocaine 40 mg  2nd dose, through catheter, after waiting 3 minutes........Marland KitchenXylocaine 60 mg    ( 1% Xylo charted as a single dose in Epic Meds for ease of charting; actual dosing was fractionated as above, for saftey's sake)  As each dose occurred, patient was free of IV sx; and patient exhibited no evidence of SA injection.  Patient is more comfortable after epidural dosed. Please see RN's note for documentation of vital signs,and FHR which are stable.  Patient reminded not to try to ambulate with numb legs, and that an RN must be present when she attempts to get up.

## 2015-05-07 NOTE — Anesthesia Preprocedure Evaluation (Signed)
Anesthesia Evaluation  Patient identified by MRN, date of birth, ID band Patient awake    Reviewed: Allergy & Precautions, H&P , Patient's Chart, lab work & pertinent test results  Airway Mallampati: II  TM Distance: >3 FB Neck ROM: full    Dental  (+) Teeth Intact   Pulmonary    breath sounds clear to auscultation       Cardiovascular  Rhythm:regular Rate:Normal     Neuro/Psych    GI/Hepatic   Endo/Other    Renal/GU      Musculoskeletal   Abdominal   Peds  Hematology   Anesthesia Other Findings       Reproductive/Obstetrics (+) Pregnancy                             Anesthesia Physical Anesthesia Plan  ASA: II  Anesthesia Plan: Epidural   Post-op Pain Management:    Induction:   Airway Management Planned:   Additional Equipment:   Intra-op Plan:   Post-operative Plan:   Informed Consent: I have reviewed the patients History and Physical, chart, labs and discussed the procedure including the risks, benefits and alternatives for the proposed anesthesia with the patient or authorized representative who has indicated his/her understanding and acceptance.   Dental Advisory Given  Plan Discussed with:   Anesthesia Plan Comments: (Labs checked- platelets confirmed with RN in room. Fetal heart tracing, per RN, reported to be stable enough for sitting procedure. Discussed epidural, and patient consents to the procedure:  included risk of possible headache,backache, failed block, allergic reaction, and nerve injury. This patient was asked if she had any questions or concerns before the procedure started.)        Anesthesia Quick Evaluation  

## 2015-05-08 LAB — CBC
HEMATOCRIT: 32.6 % — AB (ref 36.0–46.0)
Hemoglobin: 10.6 g/dL — ABNORMAL LOW (ref 12.0–15.0)
MCH: 29.2 pg (ref 26.0–34.0)
MCHC: 32.5 g/dL (ref 30.0–36.0)
MCV: 89.8 fL (ref 78.0–100.0)
PLATELETS: 208 10*3/uL (ref 150–400)
RBC: 3.63 MIL/uL — ABNORMAL LOW (ref 3.87–5.11)
RDW: 14.8 % (ref 11.5–15.5)
WBC: 10.4 10*3/uL (ref 4.0–10.5)

## 2015-05-08 MED ORDER — MEASLES, MUMPS & RUBELLA VAC ~~LOC~~ INJ
0.5000 mL | INJECTION | Freq: Once | SUBCUTANEOUS | Status: AC
  Administered 2015-05-08: 0.5 mL via SUBCUTANEOUS
  Filled 2015-05-08: qty 0.5

## 2015-05-08 NOTE — Lactation Note (Signed)
This note was copied from the chart of Ashley [name]. Lactation Consultation Note  Patient Name: Ashley Judy Pollman KGURK'Y Date: 05/08/2015 Reason for consult: Initial assessment with this mom of a term infant, now 22 hours old, weighing 6 lbs 9 oz at birth.Mom repots baby has been breast feeding today every 2-3 hours, for about 10 minutes, with on and off suckling. She said he did not breast feed much at all during the night.  which we spoon fed to baby.  assisted mom with latching him in cross cradle hold, but he would not latch, very sleepy. I suggested we try hand expression and spoon feeing, to get som colostrum into him, and maybe get his appetite increased. I was able to hand express about 3 mls of colostrum, but he let about 1 ml spill out of his mouth. The baby has been spitty , of mucous and burping. I encouraged mom to keep the baby skin to skin, and to attempt feeding at least every 3 hours. Mom will hand express and spoon feed baby, if he will not latch. Mom may aske for a DEP to be set up later, if breast feeding does not improve.   I di note a posterior frenulum that seem tight with elevation, and an upper lip frenulum that extends to the gum line. With finger sucking, hsi suck seemed coordinated, and did pull my finger into his mouth, and his tongue was able to go beyond his gum line. I told mom to make her pediatrician aware of my findings, but at this time I do not feel thewse will affect breast feeding, although it may be what is making it difficult for him to consistently suck. A  nipple shield may help, to keep baby latched and sucking, if needed.    Maternal Data Formula Feeding for Exclusion: No Has patient been taught Hand Expression?: Yes Does the patient have breastfeeding experience prior to this delivery?: Yes  Feeding Feeding Type: Breast Milk  LATCH Score/Interventions Latch: Too sleepy or reluctant, no latch achieved, no sucking elicited. Intervention(s): Skin to  skin;Teach feeding cues;Waking techniques Intervention(s): Assist with latch  Audible Swallowing: None Intervention(s): Skin to skin;Hand expression  Type of Nipple: Everted at rest and after stimulation  Comfort (Breast/Nipple): Soft / non-tender     Hold (Positioning): Assistance needed to correctly position infant at breast and maintain latch. Intervention(s): Breastfeeding basics reviewed;Support Pillows;Position options;Skin to skin  LATCH Score: 5  Lactation Tools Discussed/Used     Consult Status Consult Status: Follow-up Date: 05/09/15 Follow-up type: In-patient    Tonna Corner 05/08/2015, 3:46 PM

## 2015-05-08 NOTE — Progress Notes (Signed)
Patient is eating, ambulating, voiding.  Pain control is good.  Filed Vitals:   05/07/15 1845 05/07/15 1945 05/07/15 2300 05/08/15 0606  BP: 95/54 107/49 86/42 100/64  Pulse: 70 72 71 60  Temp: 98.5 F (36.9 C) 98.4 F (36.9 C) 97.6 F (36.4 C) 98 F (36.7 C)  TempSrc: Oral Oral Oral Oral  Resp: $Remo'20 20 18 18  'MsIQS$ Height:      Weight:      SpO2:    100%    Fundus firm Perineum without swelling.  Lab Results  Component Value Date   WBC 10.4 05/08/2015   HGB 10.6* 05/08/2015   HCT 32.6* 05/08/2015   MCV 89.8 05/08/2015   PLT 208 05/08/2015    --/--/O NEG (07/25 1240)/RNI  A/P Post partum day 1.  Routine care.  Expect d/c routine.   Baby O neg- NO Rhogam.  Pt needs MMR.  Dempsey Knotek A

## 2015-05-08 NOTE — Progress Notes (Signed)
Ashley [name] was referred for history of depression/anxiety.  Referral is screened out by Clinical Social Worker because none of the following criteria appear to apply: -History of anxiety/depression during this pregnancy, or of post-partum depression. - Diagnosis of anxiety and/or depression within last 3 years or -Ashley [name]'s symptoms are currently being treated with medication and/or therapy. Ashley [name] is currently prescribed Prozac.  Please contact the Clinical Social Worker if needs arise or upon Ashley [name] request.

## 2015-05-08 NOTE — Progress Notes (Signed)
Parents desire circ but baby has not had optimal feeding.  They wish to delay circ until tomorrow.

## 2015-05-08 NOTE — Anesthesia Postprocedure Evaluation (Signed)
  Anesthesia Post-op Note  Patient: Ashley [name]  Procedure(s) Performed: * No procedures listed *  Patient Location: Mother/Baby  Anesthesia Type:Epidural  Level of Consciousness: awake, alert , oriented and patient cooperative  Airway and Oxygen Therapy: Patient Spontanous Breathing  Post-op Pain: none  Post-op Assessment: Post-op Vital signs reviewed, Patient's Cardiovascular Status Stable, Respiratory Function Stable, Patent Airway, No headache, No backache and Patient able to bend at knees              Post-op Vital Signs: Reviewed and stable  Last Vitals:  Filed Vitals:   05/08/15 0606  BP: 100/64  Pulse: 60  Temp: 36.7 C  Resp: 18    Complications: No apparent anesthesia complications

## 2015-05-09 MED ORDER — IBUPROFEN 600 MG PO TABS: 600.0000 mg | ORAL_TABLET | Freq: Four times a day (QID) | ORAL | Status: DC | PRN

## 2015-05-09 NOTE — Discharge Summary (Signed)
  Obstetric Discharge Summary Reason for Admission: induction of labor Prenatal Procedures: none Intrapartum Procedures: spontaneous vaginal delivery Postpartum Procedures: none Complications-Operative and Postpartum: none HEMOGLOBIN  Date Value Ref Range Status  05/08/2015 10.6* 12.0 - 15.0 g/dL Final   HCT  Date Value Ref Range Status  05/08/2015 32.6* 36.0 - 46.0 % Final    Physical Exam:  General: alert, cooperative and appears stated age 39: appropriate Uterine Fundus: firm DVT Evaluation: No evidence of DVT seen on physical exam.  Discharge Diagnoses: Term Pregnancy-delivered  Discharge Information: Date: 05/09/2015 Activity: pelvic rest Diet: routine Medications: PNV and Ibuprofen Condition: stable Instructions: refer to practice specific booklet Discharge to: home   Newborn Data: Live born female  Birth Weight: 6 lb 11.6 oz (3050 g) APGAR: 7, 9  Home with mother.  [place] 05/09/2015, 8:26 AM

## 2015-05-09 NOTE — Progress Notes (Signed)
Patient is eating, ambulating, voiding.  Pain control is good.  Filed Vitals:   05/07/15 2300 05/08/15 0606 05/08/15 1841 05/09/15 0644  BP: 86/42 100/64 96/51 83/47  Pulse: 71 60 56 55  Temp: 97.6 F (36.4 C) 98 F (36.7 C) 98.2 F (36.8 C) 98.5 F (36.9 C)  TempSrc: Oral Oral Oral Oral  Resp: _0 Height:      Weight:      SpO2:  100%      Fundus firm Perineum without swelling.  Lab Results  Component Value Date   WBC 10.4 05/08/2015   HGB 10.6* 05/08/2015   HCT 32.6* 05/08/2015   MCV 89.8 05/08/2015   PLT 208 05/08/2015    --/--/O NEG (07/25 1240)/RNI  A/P Post partum day 1.  Routine care.  Expect d/c routine.   Baby O neg- NO Rhogam.  Pt needs MMR.  Desires circumcisison. Discussed r/b/a of the procedure. Reviewed that circumcision is an elective surgical procedure and not considered medically necessary. Reviewed the risks of the procedure including the risk of infection, bleeding, damage to surrounding structures, including scrotum, shaft, urethra and head of penis, and an undesired cosmetic effect requiring additional procedures for revision. Consent signed.    [place]

## 2015-05-09 NOTE — Discharge Instructions (Signed)

## 2015-05-09 NOTE — Lactation Note (Signed)
This note was copied from the chart of Ashley [name]. Lactation Consultation Note  Mom states baby has been feeding well since she started using nipple shield last night.  She used a nipple shield with her previous babies.  She states feeding is comfortable and colostrum in shield when baby came off.  Baby just returned from circumcision.  Placed at breast to attempt feeding but baby sleeping and showing no interest.  Mom asked several good questions.  Questions answered and teaching done.  Outpatient lactation services and support reviewed and encouraged.  Patient Name: Ashley Ashleyanne Hemmingway [id number] Date: 05/09/2015 Reason for consult: Follow-up assessment   Maternal Data    Feeding Feeding Type: Breast Fed Length of feed: 35 min  LATCH Score/Interventions Latch: Too sleepy or reluctant, no latch achieved, no sucking elicited. Intervention(s): Skin to skin  Audible Swallowing: None Intervention(s): Hand expression  Type of Nipple: Everted at rest and after stimulation  Comfort (Breast/Nipple): Soft / non-tender     Hold (Positioning): No assistance needed to correctly position infant at breast. Intervention(s): Breastfeeding basics reviewed;Support Pillows;Position options;Skin to skin  LATCH Score: 6  Lactation Tools Discussed/Used     Consult Status Consult Status: Complete    Ave Filter 05/09/2015, 11:17 AM

## 2015-05-11 LAB — TYPE AND SCREEN
ABO/RH(D): O NEG
Antibody Screen: POSITIVE
DAT, IgG: NEGATIVE
Unit division: 0
Unit division: 0

## 2015-05-20 ENCOUNTER — Emergency Department (HOSPITAL_COMMUNITY): Payer: BLUE CROSS/BLUE SHIELD

## 2015-05-20 ENCOUNTER — Inpatient Hospital Stay (HOSPITAL_COMMUNITY)
Admission: AD | Admit: 2015-05-20 | Discharge: 2015-05-26 | DRG: 776 | Disposition: A | Discharge status: Rehabilitation Facility | Payer: BLUE CROSS/BLUE SHIELD | Source: Ambulatory Visit | Attending: Neurology | Admitting: Neurology

## 2015-05-20 ENCOUNTER — Encounter (HOSPITAL_COMMUNITY): Payer: Self-pay | Admitting: Family

## 2015-05-20 DIAGNOSIS — I615 Nontraumatic intracerebral hemorrhage, intraventricular: Secondary | ICD-10-CM | POA: Diagnosis present

## 2015-05-20 DIAGNOSIS — G40901 Epilepsy, unspecified, not intractable, with status epilepticus: Secondary | ICD-10-CM | POA: Diagnosis present

## 2015-05-20 DIAGNOSIS — I619 Nontraumatic intracerebral hemorrhage, unspecified: Secondary | ICD-10-CM | POA: Diagnosis not present

## 2015-05-20 DIAGNOSIS — G08 Intracranial and intraspinal phlebitis and thrombophlebitis: Secondary | ICD-10-CM

## 2015-05-20 DIAGNOSIS — G936 Cerebral edema: Secondary | ICD-10-CM | POA: Diagnosis present

## 2015-05-20 DIAGNOSIS — O9943 Diseases of the circulatory system complicating the puerperium: Secondary | ICD-10-CM | POA: Diagnosis not present

## 2015-05-20 DIAGNOSIS — G8101 Flaccid hemiplegia affecting right dominant side: Secondary | ICD-10-CM | POA: Diagnosis present

## 2015-05-20 DIAGNOSIS — Z791 Long term (current) use of non-steroidal anti-inflammatories (NSAID): Secondary | ICD-10-CM

## 2015-05-20 DIAGNOSIS — Z79899 Other long term (current) drug therapy: Secondary | ICD-10-CM

## 2015-05-20 DIAGNOSIS — I611 Nontraumatic intracerebral hemorrhage in hemisphere, cortical: Secondary | ICD-10-CM | POA: Diagnosis present

## 2015-05-20 DIAGNOSIS — G911 Obstructive hydrocephalus: Secondary | ICD-10-CM | POA: Diagnosis present

## 2015-05-20 DIAGNOSIS — F419 Anxiety disorder, unspecified: Secondary | ICD-10-CM | POA: Diagnosis present

## 2015-05-20 DIAGNOSIS — R4701 Aphasia: Secondary | ICD-10-CM | POA: Diagnosis present

## 2015-05-20 DIAGNOSIS — O873 Cerebral venous thrombosis in the puerperium: Secondary | ICD-10-CM | POA: Diagnosis present

## 2015-05-20 LAB — COMPREHENSIVE METABOLIC PANEL
ALK PHOS: 113 U/L (ref 38–126)
ALT: 32 U/L (ref 14–54)
ANION GAP: 10 (ref 5–15)
AST: 28 U/L (ref 15–41)
Albumin: 3.7 g/dL (ref 3.5–5.0)
BUN: 15 mg/dL (ref 6–20)
CO2: 31 mmol/L (ref 22–32)
Calcium: 9.3 mg/dL (ref 8.9–10.3)
Chloride: 99 mmol/L — ABNORMAL LOW (ref 101–111)
Creatinine, Ser: 0.57 mg/dL (ref 0.44–1.00)
GFR calc non Af Amer: >60 mL/min (ref >60)
Glucose, Bld: 102 mg/dL — ABNORMAL HIGH (ref 65–99)
Potassium: 4.5 mmol/L (ref 3.5–5.1)
SODIUM: 140 mmol/L (ref 135–145)
TOTAL PROTEIN: 6.4 g/dL — AB (ref 6.5–8.1)
Total Bilirubin: 0.4 mg/dL (ref 0.3–1.2)

## 2015-05-20 LAB — CBC
HEMATOCRIT: 39.9 % (ref 36.0–46.0)
HEMOGLOBIN: 13.3 g/dL (ref 12.0–15.0)
MCH: 29.5 pg (ref 26.0–34.0)
MCHC: 33.3 g/dL (ref 30.0–36.0)
MCV: 88.5 fL (ref 78.0–100.0)
Platelets: 238 10*3/uL (ref 150–400)
RBC: 4.51 MIL/uL (ref 3.87–5.11)
RDW: 13.8 % (ref 11.5–15.5)
WBC: 6 10*3/uL (ref 4.0–10.5)

## 2015-05-20 LAB — URINALYSIS, ROUTINE W REFLEX MICROSCOPIC
BILIRUBIN URINE: NEGATIVE
GLUCOSE, UA: NEGATIVE mg/dL
Ketones, ur: NEGATIVE mg/dL
Leukocytes, UA: NEGATIVE
Nitrite: NEGATIVE
Protein, ur: NEGATIVE mg/dL
Specific Gravity, Urine: <1.005 — ABNORMAL LOW (ref 1.005–1.030)
Urobilinogen, UA: 0.2 mg/dL (ref 0.0–1.0)
pH: 7 (ref 5.0–8.0)

## 2015-05-20 LAB — URINE MICROSCOPIC-ADD ON: WBC, UA: NONE SEEN WBC/hpf (ref <3)

## 2015-05-20 LAB — I-STAT CHEM 8, ED
BUN: 13 mg/dL (ref 6–20)
CREATININE: 0.6 mg/dL (ref 0.44–1.00)
Calcium, Ion: 1.1 mmol/L — ABNORMAL LOW (ref 1.12–1.23)
Chloride: 104 mmol/L (ref 101–111)
Glucose, Bld: 111 mg/dL — ABNORMAL HIGH (ref 65–99)
HEMATOCRIT: 44 % (ref 36.0–46.0)
Hemoglobin: 15 g/dL (ref 12.0–15.0)
Potassium: 3.5 mmol/L (ref 3.5–5.1)
Sodium: 140 mmol/L (ref 135–145)
TCO2: 27 mmol/L (ref 0–100)

## 2015-05-20 LAB — DIFFERENTIAL
BASOS ABS: 0 10*3/uL (ref 0.0–0.1)
BASOS PCT: 0 % (ref 0–1)
Eosinophils Absolute: 0.1 10*3/uL (ref 0.0–0.7)
Eosinophils Relative: 1 % (ref 0–5)
LYMPHS ABS: 2.9 10*3/uL (ref 0.7–4.0)
LYMPHS PCT: 29 % (ref 12–46)
Monocytes Absolute: 0.5 10*3/uL (ref 0.1–1.0)
Monocytes Relative: 5 % (ref 3–12)
Neutro Abs: 6.4 10*3/uL (ref 1.7–7.7)
Neutrophils Relative %: 65 % (ref 43–77)

## 2015-05-20 MED ORDER — KETOROLAC TROMETHAMINE 30 MG/ML IJ SOLN: 30.0000 mg | Freq: Once | INTRAMUSCULAR | Status: DC

## 2015-05-20 MED ORDER — LORAZEPAM 2 MG/ML IJ SOLN
INTRAMUSCULAR | Status: AC
  Filled 2015-05-20: qty 1

## 2015-05-20 MED ORDER — DIPHENHYDRAMINE HCL 50 MG/ML IJ SOLN
25.0000 mg | Freq: Once | INTRAMUSCULAR | Status: AC
  Administered 2015-05-20: 25 mg via INTRAVENOUS
  Filled 2015-05-20: qty 1

## 2015-05-20 MED ORDER — PROCHLORPERAZINE EDISYLATE 5 MG/ML IJ SOLN
10.0000 mg | Freq: Once | INTRAMUSCULAR | Status: AC
  Administered 2015-05-20: 10 mg via INTRAVENOUS
  Filled 2015-05-20: qty 2

## 2015-05-20 MED ORDER — MAGNESIUM SULFATE 2 GM/50ML IV SOLN
2.0000 g | Freq: Once | INTRAVENOUS | Status: AC
  Administered 2015-05-21: 2 g via INTRAVENOUS
  Filled 2015-05-20: qty 50

## 2015-05-20 MED ORDER — LORAZEPAM 2 MG/ML IJ SOLN
2.0000 mg | Freq: Once | INTRAMUSCULAR | Status: AC
  Administered 2015-05-20: 2 mg via INTRAVENOUS

## 2015-05-20 MED ORDER — SODIUM CHLORIDE 0.9 % IV SOLN
INTRAVENOUS | Status: DC
  Administered 2015-05-20 – 2015-05-24 (×2): via INTRAVENOUS

## 2015-05-20 MED ORDER — HYDRALAZINE HCL 20 MG/ML IJ SOLN: 5.0000 mg | Freq: Once | INTRAMUSCULAR | Status: DC

## 2015-05-20 NOTE — MAU Note (Addendum)
Rapid Response at bedside. 5L O2 by nasal canula applied. 88% on room air when applied

## 2015-05-20 NOTE — ED Provider Notes (Signed)
MSE was initiated and I personally evaluated the patient and placed orders (if any) at  11:49 PM on May 20, 2015.  The patient appears stable so that the remainder of the MSE may be completed by another provider.  39 year old female was transferred here from [facility] as a code stroke. She is 2 weeks postpartum and presented there with some right-sided weakness and left facial droop. She had a seizure as she arrived in the ED. She is currently not responding to verbal stimuli. There is definite right-sided weakness with clonus on the right. She is noted to be mildly hypertensive. Overall picture is consistent with eclampsia of pregnancy. Dr. Doy Mince of neurology is here. She is given lorazepam to help break the seizure and is given initial dose of magnesium sulfate.  Delora Fuel, MD 71/06/26 [id number]

## 2015-05-20 NOTE — ED Notes (Signed)
Ativan given

## 2015-05-20 NOTE — MAU Note (Signed)
Pt reports she has what she thinks is a migraine. States she has had headaches since her delivery 2 weeks ago.

## 2015-05-20 NOTE — MAU Note (Signed)
Dr. Carlis Abbott called RN to bedside for pt symptoms. Requested RN call rapid response team and initiate code stroke. EMS called for emergency transport to [facility] for stroke evaluation. EMS will call when truck on the way. Report given to [facility] operator to initiate code stroke. MD at bedside. Rapid response at bedside starting second IV in left AC.

## 2015-05-20 NOTE — ED Notes (Signed)
Arrived and taken to trauam b due to seizing, dr Roxanne Mins and dr Doy Mince at bedside

## 2015-05-20 NOTE — ED Notes (Signed)
Onset of stroke symptoms at [facility] was 2154.

## 2015-05-20 NOTE — MAU Note (Signed)
EMS at bedside to transfer pt. Report given to EMS

## 2015-05-20 NOTE — MAU Note (Signed)
EMS reported they are in route from [facility]

## 2015-05-20 NOTE — Progress Notes (Addendum)
39 yo [name] who is 2 weeks s/p uncomplicated SVD presents w severe HA.   Reports HA on and off since delivery, worse since 1600 today.  No improvement w tylenol or excedrin migraine.  On arrival to MAU, BPs were severe 150-160s/90s.  Baseline BP 90/60.  She reported overall not feeling well.  She reported to nursing that she felt weak shortly after arrival (~2155).  Neuro exam by CNM was normal at that time.  She ambulated to the BR without assistance at 2200. She received IV compazine and benadryl for her HA while a preeclampsia work up was started.  Upon my arrival at 2245, she was agitated in the stretcher.  She reported right extremity numbness and weakness.  At the time of my exam, she exhibited decreased cold sensation on right as well as decreased strength.  Pt unable to squeeze right hand, flex right foot or fully lift right leg off of bed.   At this time, code stroke was activated.  While waiting for EMS to transport to [facility] for stat head [facility], the patient developed right facial droop and mild aphasia.  BPs fell to the 120s/70s, she had decreased respiratory drive and required 5L [place] to maintain O2 sats.   Of note, IV hydralazine was ordered for initial severe range BPs but medication was NOT administered.   She was transported to [facility] for head CT and stroke evaluation.     Past Medical History  Diagnosis Date  . Anxiety     Past Surgical History  Procedure Laterality Date  . Dilation and curettage of uterus      OB History  Gravida Para Term Preterm AB SAB TAB Ectopic Multiple Living  9 3 3  6 3 2   0 3    # Outcome Date GA Lbr Len/2nd Weight Sex Delivery Anes PTL Lv  9 Term 05/07/15 [redacted]w[redacted]d 04:08 / 01:00 3.05 kg (6 lb 11.6 oz) M Vag-Spont EPI  Y  8 AB 2015          7 SAB 2014          6 TAB 2014          5 Term 2010 [redacted]w[redacted]d  2.835 kg (6 lb 4 oz) F Vag-Spont EPI  Y  4 SAB 2009          3 Term 2008 [redacted]w[redacted]d  3.204 kg (7 lb 1 oz) M Vag-Spont EPI  Y  2 SAB 2007          1 TAB [place]

## 2015-05-20 NOTE — MAU Provider Note (Signed)
History     CSN: [id number]  Arrival date and time: 05/20/15 2114   None     Chief Complaint  Patient presents with  . Headache   HPI  [name] is a 39 y.o. [id number] here s/p NSVD on 05/07/15 here with report of a severe headache.  Headache is rated a 10/10.  Pt also reports blurred vision and hearing "muffled" in left ear.  Upon review of the records no issues with hypertension either chronic or during the pregnancy.  Pt discharged home with no complications.  Pt also reports right leg numbness that began shortly after arrival to MAU.    Past Medical History  Diagnosis Date  . Anxiety     Past Surgical History  Procedure Laterality Date  . Dilation and curettage of uterus      Family History  Problem Relation Age of Onset  . Cancer Mother   . Cancer Father     History  Substance Use Topics  . Smoking status: Never Smoker   . Smokeless tobacco: Never Used  . Alcohol Use: Yes     Comment: NONE  WHILE  PREG    Allergies: No Known Allergies  Prescriptions prior to admission  Medication Sig Dispense Refill Last Dose  . acetaminophen (TYLENOL) 500 MG tablet Take 1,000 mg by mouth every 6 (six) hours as needed for headache.   prn  . acyclovir (ZOVIRAX) 400 MG tablet Take 400 mg by mouth 5 (five) times daily as needed (for breakout).    prn  . docusate sodium (COLACE) 100 MG capsule Take 100 mg by mouth daily.   05/06/2015 at Unknown time  . FLUoxetine (PROZAC) 40 MG capsule Take 40 mg by mouth daily.   05/07/2015 at Unknown time  . ibuprofen (ADVIL,MOTRIN) 600 MG tablet Take 1 tablet (600 mg total) by mouth every 6 (six) hours as needed for moderate pain or cramping. 40 tablet 0   . Prenatal Vit-Fe Fumarate-FA (PRENATAL MULTIVITAMIN) TABS tablet Take 1 tablet by mouth daily at 12 noon.   05/07/2015 at Unknown time    Review of Systems  Constitutional: Negative for fever and chills.  HENT: Positive for hearing loss. Negative for ear pain and tinnitus.   Eyes:  Positive for blurred vision. Negative for photophobia and pain.  Respiratory: Negative for cough.   Cardiovascular: Negative for chest pain.  Gastrointestinal: Negative for nausea, vomiting and abdominal pain.  Genitourinary: Negative for dysuria and urgency.  Neurological: Positive for dizziness, sensory change (right leg) and headaches. Negative for tingling, speech change, seizures and loss of consciousness.   Physical Exam   Blood pressure 160/80, pulse 56, temperature 98.3 F (36.8 C), temperature source Oral, resp. rate 15, height 5\' 3"  (1.6 m), weight 67.586 kg (149 lb), unknown if currently breastfeeding. Filed Vitals:   05/20/15 2202 05/20/15 2224 05/20/15 2231 05/20/15 2301  BP: 159/76 167/85 125/105 140/78  Pulse: 56 77 80 63  Temp:      TempSrc:      Resp:    15  Height:      Weight:       Physical Exam  Constitutional: She is oriented to person, place, and time. She appears well-developed and well-nourished. No distress.  Tearful, appears uncomfortable  HENT:  Head: Normocephalic.  Eyes: EOM are normal. Pupils are equal, round, and reactive to light.  Neck: Normal range of motion. Neck supple.  Cardiovascular: Normal rate, regular rhythm and normal heart sounds.   Respiratory: Effort normal  and breath sounds normal. No respiratory distress.  GI: Soft. There is no tenderness.  Musculoskeletal: Normal range of motion. She exhibits no edema.  Neurological: She is alert and oriented to person, place, and time. She has normal strength. She displays abnormal reflex. No sensory deficit.  Negative facial droop; equal smile; grips equal bilat  Skin: Skin is warm and dry.    MAU Course  Procedures  MDM 2140 Report given by RN > CBC and CMP ordered 2145 exam completed 2150 Dr.Clark called > Reviewed HPI/Exam > complete neuro assessment due to RN new info on neuro symptoms (hearing decrease)  2154 Reassessment (neuro) > normal 2158 Dr. Carlis Abbott called with update on neuro  exam> compazine and benadryl ordered > Dr. Carlis Abbott plans to come to unit and admit/assess 2245 Dr. Carlis Abbott calls to order hydralazine 5 mg IV 2250 Dr. Carlis Abbott on unit to assess patient > care turned over to Dr. Pincus Badder, [facility] 05/20/2015, 1050 PM  Assessment and Plan

## 2015-05-20 NOTE — MAU Note (Signed)
Dr. Carlis Abbott at bedside to evaluate pt.

## 2015-05-21 ENCOUNTER — Inpatient Hospital Stay (HOSPITAL_COMMUNITY): Payer: BLUE CROSS/BLUE SHIELD

## 2015-05-21 ENCOUNTER — Emergency Department (HOSPITAL_COMMUNITY): Payer: BLUE CROSS/BLUE SHIELD

## 2015-05-21 ENCOUNTER — Encounter (HOSPITAL_COMMUNITY): Payer: Self-pay

## 2015-05-21 DIAGNOSIS — I611 Nontraumatic intracerebral hemorrhage in hemisphere, cortical: Secondary | ICD-10-CM | POA: Diagnosis present

## 2015-05-21 DIAGNOSIS — S06350S Traumatic hemorrhage of left cerebrum without loss of consciousness, sequela: Secondary | ICD-10-CM | POA: Diagnosis not present

## 2015-05-21 DIAGNOSIS — I959 Hypotension, unspecified: Secondary | ICD-10-CM | POA: Diagnosis not present

## 2015-05-21 DIAGNOSIS — O873 Cerebral venous thrombosis in the puerperium: Secondary | ICD-10-CM | POA: Diagnosis present

## 2015-05-21 DIAGNOSIS — M62471 Contracture of muscle, right ankle and foot: Secondary | ICD-10-CM | POA: Diagnosis not present

## 2015-05-21 DIAGNOSIS — I69151 Hemiplegia and hemiparesis following nontraumatic intracerebral hemorrhage affecting right dominant side: Secondary | ICD-10-CM | POA: Diagnosis not present

## 2015-05-21 DIAGNOSIS — I69398 Other sequelae of cerebral infarction: Secondary | ICD-10-CM | POA: Diagnosis not present

## 2015-05-21 DIAGNOSIS — G40901 Epilepsy, unspecified, not intractable, with status epilepticus: Secondary | ICD-10-CM | POA: Diagnosis present

## 2015-05-21 DIAGNOSIS — G8110 Spastic hemiplegia affecting unspecified side: Secondary | ICD-10-CM | POA: Diagnosis not present

## 2015-05-21 DIAGNOSIS — G08 Intracranial and intraspinal phlebitis and thrombophlebitis: Secondary | ICD-10-CM | POA: Insufficient documentation

## 2015-05-21 DIAGNOSIS — G4700 Insomnia, unspecified: Secondary | ICD-10-CM | POA: Diagnosis not present

## 2015-05-21 DIAGNOSIS — G936 Cerebral edema: Secondary | ICD-10-CM | POA: Insufficient documentation

## 2015-05-21 DIAGNOSIS — I615 Nontraumatic intracerebral hemorrhage, intraventricular: Secondary | ICD-10-CM | POA: Diagnosis present

## 2015-05-21 DIAGNOSIS — I610 Nontraumatic intracerebral hemorrhage in hemisphere, subcortical: Secondary | ICD-10-CM | POA: Diagnosis not present

## 2015-05-21 DIAGNOSIS — Z791 Long term (current) use of non-steroidal anti-inflammatories (NSAID): Secondary | ICD-10-CM | POA: Diagnosis not present

## 2015-05-21 DIAGNOSIS — G911 Obstructive hydrocephalus: Secondary | ICD-10-CM | POA: Diagnosis present

## 2015-05-21 DIAGNOSIS — G40909 Epilepsy, unspecified, not intractable, without status epilepticus: Secondary | ICD-10-CM | POA: Diagnosis not present

## 2015-05-21 DIAGNOSIS — I619 Nontraumatic intracerebral hemorrhage, unspecified: Secondary | ICD-10-CM | POA: Diagnosis present

## 2015-05-21 DIAGNOSIS — F419 Anxiety disorder, unspecified: Secondary | ICD-10-CM | POA: Diagnosis present

## 2015-05-21 DIAGNOSIS — G8190 Hemiplegia, unspecified affecting unspecified side: Secondary | ICD-10-CM | POA: Diagnosis not present

## 2015-05-21 DIAGNOSIS — O9943 Diseases of the circulatory system complicating the puerperium: Secondary | ICD-10-CM | POA: Diagnosis present

## 2015-05-21 DIAGNOSIS — R569 Unspecified convulsions: Secondary | ICD-10-CM | POA: Diagnosis not present

## 2015-05-21 DIAGNOSIS — G8100 Flaccid hemiplegia affecting unspecified side: Secondary | ICD-10-CM | POA: Diagnosis not present

## 2015-05-21 DIAGNOSIS — Z79899 Other long term (current) drug therapy: Secondary | ICD-10-CM | POA: Diagnosis not present

## 2015-05-21 DIAGNOSIS — I612 Nontraumatic intracerebral hemorrhage in hemisphere, unspecified: Secondary | ICD-10-CM

## 2015-05-21 DIAGNOSIS — I69120 Aphasia following nontraumatic intracerebral hemorrhage: Secondary | ICD-10-CM | POA: Diagnosis not present

## 2015-05-21 DIAGNOSIS — R4701 Aphasia: Secondary | ICD-10-CM | POA: Diagnosis not present

## 2015-05-21 DIAGNOSIS — T8351XA Infection and inflammatory reaction due to indwelling urinary catheter, initial encounter: Secondary | ICD-10-CM | POA: Diagnosis not present

## 2015-05-21 DIAGNOSIS — F4321 Adjustment disorder with depressed mood: Secondary | ICD-10-CM | POA: Diagnosis not present

## 2015-05-21 DIAGNOSIS — G8101 Flaccid hemiplegia affecting right dominant side: Secondary | ICD-10-CM | POA: Diagnosis present

## 2015-05-21 DIAGNOSIS — G40309 Generalized idiopathic epilepsy and epileptic syndromes, not intractable, without status epilepticus: Secondary | ICD-10-CM | POA: Diagnosis not present

## 2015-05-21 LAB — PROTIME-INR
INR: 1.07 (ref 0.00–1.49)
Prothrombin Time: 14.1 seconds (ref 11.6–15.2)

## 2015-05-21 LAB — APTT: APTT: 31 s (ref 24–37)

## 2015-05-21 LAB — MRSA PCR SCREENING: MRSA BY PCR: NEGATIVE

## 2015-05-21 LAB — I-STAT TROPONIN, ED: TROPONIN I, POC: 0.01 ng/mL (ref 0.00–0.08)

## 2015-05-21 LAB — SODIUM
SODIUM: 138 mmol/L (ref 135–145)
SODIUM: 146 mmol/L — AB (ref 135–145)
SODIUM: 149 mmol/L — AB (ref 135–145)

## 2015-05-21 MED ORDER — SODIUM CHLORIDE 0.9 % IV SOLN
750.0000 mg | Freq: Two times a day (BID) | INTRAVENOUS | Status: DC
  Administered 2015-05-21 – 2015-05-26 (×11): 750 mg via INTRAVENOUS
  Filled 2015-05-21 (×14): qty 7.5

## 2015-05-21 MED ORDER — SODIUM CHLORIDE 0.9 % IV SOLN: 1000.0000 mg | Freq: Once | INTRAVENOUS | Status: DC

## 2015-05-21 MED ORDER — SODIUM CHLORIDE 0.9 % IV SOLN
300.0000 mg | Freq: Two times a day (BID) | INTRAVENOUS | Status: AC
  Administered 2015-05-21: 300 mg via INTRAVENOUS
  Filled 2015-05-21: qty 30

## 2015-05-21 MED ORDER — PANTOPRAZOLE SODIUM 40 MG IV SOLR
40.0000 mg | Freq: Every day | INTRAVENOUS | Status: DC
  Administered 2015-05-21 – 2015-05-25 (×5): 40 mg via INTRAVENOUS
  Filled 2015-05-21 (×6): qty 40

## 2015-05-21 MED ORDER — SENNOSIDES-DOCUSATE SODIUM 8.6-50 MG PO TABS
1.0000 | ORAL_TABLET | Freq: Two times a day (BID) | ORAL | Status: DC
  Administered 2015-05-22 – 2015-05-26 (×7): 1 via ORAL
  Filled 2015-05-21 (×12): qty 1

## 2015-05-21 MED ORDER — ACETAMINOPHEN 650 MG RE SUPP
650.0000 mg | RECTAL | Status: DC | PRN
  Administered 2015-05-21 – 2015-05-23 (×4): 650 mg via RECTAL
  Filled 2015-05-21 (×4): qty 1

## 2015-05-21 MED ORDER — SODIUM CHLORIDE 0.9 % IV SOLN
300.0000 mg | Freq: Two times a day (BID) | INTRAVENOUS | Status: DC
  Filled 2015-05-21 (×2): qty 30

## 2015-05-21 MED ORDER — SODIUM CHLORIDE 0.9 % IV SOLN: 100.0000 mg | Freq: Two times a day (BID) | INTRAVENOUS | Status: DC

## 2015-05-21 MED ORDER — ACETAMINOPHEN 325 MG PO TABS
650.0000 mg | ORAL_TABLET | ORAL | Status: DC | PRN
  Administered 2015-05-22 – 2015-05-25 (×4): 650 mg via ORAL
  Filled 2015-05-21 (×4): qty 2

## 2015-05-21 MED ORDER — SODIUM CHLORIDE 0.9 % IV SOLN
500.0000 mg | Freq: Two times a day (BID) | INTRAVENOUS | Status: DC
  Filled 2015-05-21: qty 5

## 2015-05-21 MED ORDER — SODIUM CHLORIDE 3 % IV SOLN
INTRAVENOUS | Status: DC
  Administered 2015-05-21: 75 mL/h via INTRAVENOUS
  Administered 2015-05-21: 50 mL/h via INTRAVENOUS
  Administered 2015-05-21: 75 mL/h via INTRAVENOUS
  Administered 2015-05-22 – 2015-05-23 (×4): 50 mL/h via INTRAVENOUS
  Filled 2015-05-21 (×11): qty 500

## 2015-05-21 MED ORDER — LABETALOL HCL 5 MG/ML IV SOLN: 10.0000 mg | INTRAVENOUS | Status: DC | PRN

## 2015-05-21 MED ORDER — LORAZEPAM 2 MG/ML IJ SOLN
1.0000 mg | INTRAMUSCULAR | Status: DC | PRN
  Administered 2015-05-21: 1 mg via INTRAVENOUS
  Administered 2015-05-21: 2 mg via INTRAVENOUS

## 2015-05-21 MED ORDER — LORAZEPAM 2 MG/ML IJ SOLN
INTRAMUSCULAR | Status: AC
  Filled 2015-05-21: qty 1

## 2015-05-21 MED ORDER — LORAZEPAM 2 MG/ML IJ SOLN
INTRAMUSCULAR | Status: AC
  Administered 2015-05-21: 2 mg via INTRAVENOUS
  Filled 2015-05-21: qty 1

## 2015-05-21 MED ORDER — SODIUM CHLORIDE 0.9 % IV SOLN
1000.0000 mg | Freq: Once | INTRAVENOUS | Status: AC
  Administered 2015-05-21: 1000 mg via INTRAVENOUS
  Filled 2015-05-21: qty 10

## 2015-05-21 MED ORDER — STROKE: EARLY STAGES OF RECOVERY BOOK
Freq: Once | Status: AC
  Administered 2015-05-21: 05:00:00
  Filled 2015-05-21: qty 1

## 2015-05-21 NOTE — Progress Notes (Signed)
Subjective: Patient reports Remains obtunded  Objective: Vital signs in last 24 hours: Temp:  [98.3 F (36.8 C)-98.4 F (36.9 C)] 98.4 F (36.9 C) (08/08 0300) Pulse Rate:  [56-111] 64 (08/08 0700) Resp:  [12-24] 20 (08/08 0700) BP: (100-167)/(44-105) 117/65 mmHg (08/08 0700) SpO2:  [81 %-98 %] 95 % (08/08 0700) Weight:  [66.6 kg (146 lb 13.2 oz)-67.586 kg (149 lb)] 66.6 kg (146 lb 13.2 oz) (08/08 0300)  Intake/Output from previous day: 08/07 0701 - 08/08 0700 In: 350 [I.V.:350] Out: 835 [Urine:835] Intake/Output this shift:    Pupils equal and withdraws briskly on the left right hemiparesis but with some withdrawal  Lab Results:  Recent Labs  05/20/15 2145 05/20/15 2354  WBC 6.0  --   HGB 13.3 15.0  HCT 39.9 44.0  PLT 238  --    BMET  Recent Labs  05/20/15 2145 05/20/15 2354  NA 140 140  K 4.5 3.5  CL 99* 104  CO2 31  --   GLUCOSE 102* 111*  BUN 15 13  CREATININE 0.57 0.60  CALCIUM 9.3  --     Studies/Results: Ct Head Wo Contrast  05/21/2015   CLINICAL DATA:  Severe headaches 2 weeks postpartum  EXAM: CT HEAD WITHOUT CONTRAST  TECHNIQUE: Contiguous axial images were obtained from the base of the skull through the vertex without intravenous contrast.  COMPARISON:  None.  FINDINGS: The ventricles are normal in size and configuration. There is a hemorrhage which appears to arise from the left parietal lobe measuring 6.2 x 3.1 cm. This hemorrhage tracks anteriorly and inferiorly throughout the splenium of the corpus callosum involving most of the splenium of the corpus callosum. Hemorrhage also tracks just superior to the left lateral ventricle. There is subarachnoid hemorrhage throughout much of the brain parenchyma in the supratentorial region on the left with a small amount of subarachnoid hemorrhage crossing to the right slightly anterior to the rostrum of the corpus callosum.  There is no subdural or epidural fluid collection. There is no midline shift. Elsewhere  gray-white compartments appear normal. The bony calvarium appears intact. The mastoid air cells are clear.  IMPRESSION: Extensive intraparenchymal hemorrhage which arises from the left parietal lobe and extends into the splenium of the corpus callosum and slightly superior to the left lateral ventricle. There is extensive subarachnoid hemorrhage on the left with a small amount of subarachnoid hemorrhage crossing to the right anteriorly. The ventricles are not efface, and there is no midline shift. There is no extra-axial fluid. Elsewhere gray-white compartments appear normal.  Critical Value/emergent results were called by telephone at the time of interpretation on 05/21/2015 at 12:12 am to Dr. Doy Mince, neurology, who verbally acknowledged these results.   Electronically Signed   By: Lowella Grip III M.D.   On: 05/21/2015 00:12   Mr Jodene Nam Head Wo Contrast  05/21/2015   CLINICAL DATA:  Headache today, worse than typical migraine. Acute onset RIGHT-sided weakness. Acute RIGHT focal seizure in emergency department. Two weeks postpartum. Evaluate intracranial hemorrhage.  EXAM: MRI HEAD WITHOUT CONTRAST  MRA HEAD WITHOUT CONTRAST  MRV HEAD WITHOUT CONTRAST  TECHNIQUE: Multiplanar, multiecho pulse sequences of the brain and surrounding structures were obtained without and with intravenous contrast. Angiographic images of the head were obtained using MRA and MRV technique without contrast. MIP images provided.  COMPARISON:  CT head May 20, 2015 at 2354 hours  FINDINGS: MRI HEAD FINDINGS  Acute mesial LEFT parietal intraparenchymal hematoma better seen on prior CT, dissecting along the  body of the corpus callosum to the RIGHT. Intraventricular extension with layering blood products in the LEFT lateral ventricle. No hydrocephalus. Minimal surrounding T2 bright vasogenic edema, local sulcal effacement without midline shift. Mass effect on LEFT lateral ventricle. No satellite areas of abnormal parenchymal signal.  Susceptibility artifact along the interhemispheric fissure, LEFT sylvian fissure in LEFT cerebellar tentorium consistent with acute blood products seen on today's CT. Bilateral hippocampi demonstrate normal size, morphology and signal characteristics.  Ocular globes and orbital contents are unremarkable. Paranasal sinuses and mastoid air cells are well aerated. No abnormal sellar expansion. No cerebellar tonsillar ectopia. No suspicious calvarial bone marrow signal.  MRA HEAD FINDINGS  Anterior circulation: Normal flow related enhancement of the included cervical, petrous, cavernous and supra clinoid internal carotid arteries. Patent anterior communicating artery. Normal flow related enhancement of the anterior and middle cerebral arteries, including more distal segments. RIGHT AC 1 segment is dominant.  No large vessel occlusion, high-grade stenosis, abnormal luminal irregularity, aneurysm.  Posterior circulation: Codominant vertebral arteries. Basilar artery is patent, with normal flow related enhancement of the main branch vessels. Patent posterior cerebral arteries. Slightly decreased and flow related enhancement of the distal LEFT posterior cerebral artery though, with normal in caliber, likely representing slow flow secondary to local mass effect.  No large vessel occlusion, high-grade stenosis, abnormal luminal irregularity, aneurysm.  MRV HEAD FINDINGS  Normal flow related enhancement within the superior sagittal sinus, torcula of the Herophili, bilateral transverse, sigmoid sinuses and included internal jugular veins. Normal flow related enhancement of the internal cerebral veins.  IMPRESSION: MRI HEAD: Acute large LEFT parietal intraparenchymal hematoma dissecting along the corpus callosum, intraventricular extension without obstructive hydrocephalus. Scattered subarachnoid hemorrhage better seen on today's CT head.  Findings suggest postpartum angiopathy ; postpartum angiopathy typically affects the  medium and small vessels which are not well characterized on MRA of the head; follow-up CTA of the head or digital subtraction angiography could be performed as clinically indicated . Location is also typical for venous infarction though, there is no major dural venous sinus thrombosis. Additional consideration are vascular malformation or hemorrhagic mass. Recommend followup MRI of the head with contrast in 2-4 weeks.  MRA HEAD: No acute vascular process ; normal MRA head.  MRV HEAD: No dural venous sinus thrombosis ; normal noncontrast MRV head.   Electronically Signed   By: Elon Alas M.D.   On: 05/21/2015 03:35   Mr Brain Wo Contrast  05/21/2015   CLINICAL DATA:  Headache today, worse than typical migraine. Acute onset RIGHT-sided weakness. Acute RIGHT focal seizure in emergency department. Two weeks postpartum. Evaluate intracranial hemorrhage.  EXAM: MRI HEAD WITHOUT CONTRAST  MRA HEAD WITHOUT CONTRAST  MRV HEAD WITHOUT CONTRAST  TECHNIQUE: Multiplanar, multiecho pulse sequences of the brain and surrounding structures were obtained without and with intravenous contrast. Angiographic images of the head were obtained using MRA and MRV technique without contrast. MIP images provided.  COMPARISON:  CT head May 20, 2015 at 2354 hours  FINDINGS: MRI HEAD FINDINGS  Acute mesial LEFT parietal intraparenchymal hematoma better seen on prior CT, dissecting along the body of the corpus callosum to the RIGHT. Intraventricular extension with layering blood products in the LEFT lateral ventricle. No hydrocephalus. Minimal surrounding T2 bright vasogenic edema, local sulcal effacement without midline shift. Mass effect on LEFT lateral ventricle. No satellite areas of abnormal parenchymal signal. Susceptibility artifact along the interhemispheric fissure, LEFT sylvian fissure in LEFT cerebellar tentorium consistent with acute blood products seen on today's CT. Bilateral  hippocampi demonstrate normal size, morphology  and signal characteristics.  Ocular globes and orbital contents are unremarkable. Paranasal sinuses and mastoid air cells are well aerated. No abnormal sellar expansion. No cerebellar tonsillar ectopia. No suspicious calvarial bone marrow signal.  MRA HEAD FINDINGS  Anterior circulation: Normal flow related enhancement of the included cervical, petrous, cavernous and supra clinoid internal carotid arteries. Patent anterior communicating artery. Normal flow related enhancement of the anterior and middle cerebral arteries, including more distal segments. RIGHT AC 1 segment is dominant.  No large vessel occlusion, high-grade stenosis, abnormal luminal irregularity, aneurysm.  Posterior circulation: Codominant vertebral arteries. Basilar artery is patent, with normal flow related enhancement of the main branch vessels. Patent posterior cerebral arteries. Slightly decreased and flow related enhancement of the distal LEFT posterior cerebral artery though, with normal in caliber, likely representing slow flow secondary to local mass effect.  No large vessel occlusion, high-grade stenosis, abnormal luminal irregularity, aneurysm.  MRV HEAD FINDINGS  Normal flow related enhancement within the superior sagittal sinus, torcula of the Herophili, bilateral transverse, sigmoid sinuses and included internal jugular veins. Normal flow related enhancement of the internal cerebral veins.  IMPRESSION: MRI HEAD: Acute large LEFT parietal intraparenchymal hematoma dissecting along the corpus callosum, intraventricular extension without obstructive hydrocephalus. Scattered subarachnoid hemorrhage better seen on today's CT head.  Findings suggest postpartum angiopathy ; postpartum angiopathy typically affects the medium and small vessels which are not well characterized on MRA of the head; follow-up CTA of the head or digital subtraction angiography could be performed as clinically indicated . Location is also typical for venous  infarction though, there is no major dural venous sinus thrombosis. Additional consideration are vascular malformation or hemorrhagic mass. Recommend followup MRI of the head with contrast in 2-4 weeks.  MRA HEAD: No acute vascular process ; normal MRA head.  MRV HEAD: No dural venous sinus thrombosis ; normal noncontrast MRV head.   Electronically Signed   By: Elon Alas M.D.   On: 05/21/2015 03:35   Dg Chest Portable 1 View  05/21/2015   CLINICAL DATA:  Central line repositioning.  Initial encounter.  EXAM: PORTABLE CHEST - 1 VIEW  COMPARISON:  None.  FINDINGS: The patient's left IJ line has been retracted, and is now seen ending overlying the proximal SVC.  The lungs are well-aerated and clear. There is no evidence of focal opacification, pleural effusion or pneumothorax.  The cardiomediastinal silhouette is borderline normal in size. No acute osseous abnormalities are seen.  IMPRESSION: 1. Left IJ line has been retracted, and is now seen ending overlying the proximal SVC. 2. No acute cardiopulmonary process seen.   Electronically Signed   By: Garald Balding M.D.   On: 05/21/2015 02:21   Dg Chest Portable 1 View  05/21/2015   CLINICAL DATA:  Left central line placement.  Initial encounter.  EXAM: PORTABLE CHEST - 1 VIEW  COMPARISON:  None.  FINDINGS: The patient's left IJ line is noted extending overlying the right brachiocephalic vein. This should be retracted at least 5 cm and repositioned.  The lungs are hypoexpanded. No pleural effusion or pneumothorax is seen.  The cardiomediastinal silhouette is borderline normal in size. No acute osseous abnormalities are identified.  IMPRESSION: 1. Left IJ line noted ending overlying the right brachiocephalic vein. This should be retracted at least 5 cm and repositioned, as deemed clinically appropriate. 2. Lungs hypoexpanded but grossly clear.  These results were called by telephone at the time of interpretation on 05/21/2015 at 2:15 am  to [employer] on [facility],  who verbally acknowledged these results.   Electronically Signed   By: Garald Balding M.D.   On: 05/21/2015 02:15   Mr Mrv Head Wo Cm  05/21/2015   CLINICAL DATA:  Headache today, worse than typical migraine. Acute onset RIGHT-sided weakness. Acute RIGHT focal seizure in emergency department. Two weeks postpartum. Evaluate intracranial hemorrhage.  EXAM: MRI HEAD WITHOUT CONTRAST  MRA HEAD WITHOUT CONTRAST  MRV HEAD WITHOUT CONTRAST  TECHNIQUE: Multiplanar, multiecho pulse sequences of the brain and surrounding structures were obtained without and with intravenous contrast. Angiographic images of the head were obtained using MRA and MRV technique without contrast. MIP images provided.  COMPARISON:  CT head May 20, 2015 at 2354 hours  FINDINGS: MRI HEAD FINDINGS  Acute mesial LEFT parietal intraparenchymal hematoma better seen on prior CT, dissecting along the body of the corpus callosum to the RIGHT. Intraventricular extension with layering blood products in the LEFT lateral ventricle. No hydrocephalus. Minimal surrounding T2 bright vasogenic edema, local sulcal effacement without midline shift. Mass effect on LEFT lateral ventricle. No satellite areas of abnormal parenchymal signal. Susceptibility artifact along the interhemispheric fissure, LEFT sylvian fissure in LEFT cerebellar tentorium consistent with acute blood products seen on today's CT. Bilateral hippocampi demonstrate normal size, morphology and signal characteristics.  Ocular globes and orbital contents are unremarkable. Paranasal sinuses and mastoid air cells are well aerated. No abnormal sellar expansion. No cerebellar tonsillar ectopia. No suspicious calvarial bone marrow signal.  MRA HEAD FINDINGS  Anterior circulation: Normal flow related enhancement of the included cervical, petrous, cavernous and supra clinoid internal carotid arteries. Patent anterior communicating artery. Normal flow related enhancement of the anterior and middle cerebral  arteries, including more distal segments. RIGHT AC 1 segment is dominant.  No large vessel occlusion, high-grade stenosis, abnormal luminal irregularity, aneurysm.  Posterior circulation: Codominant vertebral arteries. Basilar artery is patent, with normal flow related enhancement of the main branch vessels. Patent posterior cerebral arteries. Slightly decreased and flow related enhancement of the distal LEFT posterior cerebral artery though, with normal in caliber, likely representing slow flow secondary to local mass effect.  No large vessel occlusion, high-grade stenosis, abnormal luminal irregularity, aneurysm.  MRV HEAD FINDINGS  Normal flow related enhancement within the superior sagittal sinus, torcula of the Herophili, bilateral transverse, sigmoid sinuses and included internal jugular veins. Normal flow related enhancement of the internal cerebral veins.  IMPRESSION: MRI HEAD: Acute large LEFT parietal intraparenchymal hematoma dissecting along the corpus callosum, intraventricular extension without obstructive hydrocephalus. Scattered subarachnoid hemorrhage better seen on today's CT head.  Findings suggest postpartum angiopathy ; postpartum angiopathy typically affects the medium and small vessels which are not well characterized on MRA of the head; follow-up CTA of the head or digital subtraction angiography could be performed as clinically indicated . Location is also typical for venous infarction though, there is no major dural venous sinus thrombosis. Additional consideration are vascular malformation or hemorrhagic mass. Recommend followup MRI of the head with contrast in 2-4 weeks.  MRA HEAD: No acute vascular process ; normal MRA head.  MRV HEAD: No dural venous sinus thrombosis ; normal noncontrast MRV head.   Electronically Signed   By: Elon Alas M.D.   On: 05/21/2015 03:35    Assessment/Plan: Continue nonoperative management patient's currently hooked up to an EEG MRI scan negative  for sinus thrombosis however I do think this is consistent with venous infarction.  LOS: 0 days     Kammi Hechler P 05/21/2015,  7:41 AM

## 2015-05-21 NOTE — Lactation Note (Addendum)
Lactation Consultation Note  Patient Name: Ashley [name] ALPFX'T Date: 05/21/2015   [facility] returned RN's call at [phone] from [facility] Neuro ICU about patient and to inquire about pumping needs since patient was breastfeeding 14 week old infant prior to admission to ICU.  Instructions regarding pumping frequency and technique given to RN.   Instructed RN to use DEBP on patient every 3 hours at least 8 times per day.  Instructed to turn dial on pump to a comfortable effective setting for patient but not too strong which can cause pain.  Instructed to use hands-on pumping method (massaging and compressions during pumping) to help maximize milk output.   RN stated they have been pumping and dumping the milk d/t medications mom is currently being given in hospital.  Medications looked up by [facility] in [facility] book Medications and Mothers Milk for lactation risk/safety ratings of each medication and its compatibility with breastfeeding (ratings range from L1 Compatible, L2 Probably Compatible, L3 Probably Compatible, L4 Hazardous, and L5 Hazardous).  The following drugs were looked up:  Keppra (L2), Vimpat (L2), Ativan (L3), Toradol (L2), Hydralazine (L2), Protonix (L1), and Magnesium (L1).   The only drug not available in [facility] Medications and Mother's Milk book was the IV fluid Sodium Chloride 3% solution.  Instructed RN to call pharmacy for reference regarding IV fluid.   Encouraged RN to call lactation department if they have further lactation needs or questions during patients' hospitalization.    Merlene Laughter 05/21/2015, 8:37 PM

## 2015-05-21 NOTE — Procedures (Signed)
EEG report.  Brief clinical history: 39 y.o. female 2 weeks post partum presenting with headache, found to have a left parietal lobe ICH. Clinical seizure activity noted   Technique: this is a prolonged 17 channel routine scalp EEG performed at the bedside with bipolar montages arranged in accordance to the international 10/20 system of electrode placement. One channel was dedicated to EKG recording. The study began at 7:31 am and ended at 10:36 am No activating procedures performed.  Description: as the study begins and throughout the entire recording, there is diffuse background attenuation that seems to be more significant over the left hemisphere. At the same, there is evidence of periodic lateralizing epileptiform discharges (PLEDS) over the left hemisphere, mainly involving the temporo-parietal region. EKG showed sinus rhythm.  Impression: this is an abnormal prolonged EEG because of the presence of left PLED's. The findings are indicative of structural brain dysfunction over the left hemisphere, but in the appropriate clinical scenario PLED's can also have an ictal significance .Clinical correlation is advised.   Dorian Pod, MD [facility]

## 2015-05-21 NOTE — ED Notes (Signed)
Patient began seizing while being transported to [place] given

## 2015-05-21 NOTE — ED Notes (Addendum)
Patient arrived via EMS from [facility].  Upon arrival to the ED patient started seizing and placed in Trauma B.  Dr Doy Mince at the bedside.

## 2015-05-21 NOTE — Progress Notes (Signed)
2217 notified Dr. Leonel Ramsay of Na+ level 149. Orders given to decrease 3% saline to 70ml/hr.

## 2015-05-21 NOTE — Care Management Note (Signed)
Case Management Note  Patient Details  Name: Ashley [name] MRN: [id number] Date of Birth: [birth year]  Subjective/Objective:    Pt admitted on 05/20/15 with intracerebral hemorrhage post partum.  PTA, pt independent, lives with spouse.                  Action/Plan: Will follow for discharge planning as pt progresses.    Expected Discharge Date:                  Expected Discharge Plan:  [name]  In-House Referral:     Discharge planning Services  CM Consult  Post Acute Care Choice:    Choice offered to:     DME Arranged:    DME Agency:     HH Arranged:    [place] Agency:     Status of Service:  In process, will continue to follow  Medicare Important Message Given:    Date Medicare IM Given:    Medicare IM give by:    Date Additional Medicare IM Given:    Additional Medicare Important Message give by:     If discussed at [place] of Stay Meetings, dates discussed:    Additional Comments:  Reinaldo Raddle, RN, BSN  Trauma/Neuro ICU Case Manager [phone]

## 2015-05-21 NOTE — H&P (Signed)
Admission H&P    Chief Complaint: Headache and right sided weakness HPI: Ashley [name] is an 39 y.o. female who is two weeks post-partum who developed a headache today that she felt was one of her migraines.  The pain became so severe that she called the OB on call.  OTC medications were tired with no relief and the patient presented to [facility] for evaluation.  While being evaluated the patient developed right sided weakness.  She eventually progressed to the point that she was also unable to communicate effectively.  Code stroke was called and the patient was transferred to [facility].  On arrival patient with right focal seizure.   Patient has been having headaches for the past couple of days.    Date last known well: Date: 05/20/2015 Time last known well: Time: 16:00 tPA Given: No: ICH  Past Medical History  Diagnosis Date  . Anxiety     Past Surgical History  Procedure Laterality Date  . Dilation and curettage of uterus      Family History  Problem Relation Age of Onset  . Cancer Mother   . Cancer Father    Social History:  reports that she has never smoked. She has never used smokeless tobacco. She reports that she drinks alcohol. She reports that she does not use illicit drugs.  Allergies: No Known Allergies  Medications: Prior to Admission medications   Medication Sig Start Date End Date Taking? Authorizing Provider  acetaminophen (TYLENOL) 500 MG tablet Take 1,000 mg by mouth every 6 (six) hours as needed for headache.    Historical Provider, MD  acyclovir (ZOVIRAX) 400 MG tablet Take 400 mg by mouth 5 (five) times daily as needed (for breakout).     Historical Provider, MD  docusate sodium (COLACE) 100 MG capsule Take 100 mg by mouth daily.    Historical Provider, MD  FLUoxetine (PROZAC) 40 MG capsule Take 40 mg by mouth daily.    Historical Provider, MD  ibuprofen (ADVIL,MOTRIN) 600 MG tablet Take 1 tablet (600 mg total) by mouth every 6 (six) hours as needed for moderate pain or  cramping. 05/09/15   Jerelyn Charles, MD  Prenatal Vit-Fe Fumarate-FA (PRENATAL MULTIVITAMIN) TABS tablet Take 1 tablet by mouth daily at 12 noon.    Historical Provider, MD    ROS: History obtained from the patient  General ROS: negative for - chills, fatigue, fever, night sweats, weight gain or weight loss Psychological ROS: negative for - behavioral disorder, hallucinations, memory difficulties, mood swings or suicidal ideation Ophthalmic ROS: negative for - blurry vision, double vision, eye pain or loss of vision ENT ROS: negative for - epistaxis, nasal discharge, oral lesions, sore throat, tinnitus or vertigo Allergy and Immunology ROS: negative for - hives or itchy/watery eyes Hematological and Lymphatic ROS: negative for - bleeding problems, bruising or swollen lymph nodes Endocrine ROS: negative for - galactorrhea, hair pattern changes, polydipsia/polyuria or temperature intolerance Respiratory ROS: negative for - cough, hemoptysis, shortness of breath or wheezing Cardiovascular ROS: negative for - chest pain, dyspnea on exertion, edema or irregular heartbeat Gastrointestinal ROS: negative for - abdominal pain, diarrhea, hematemesis, nausea/vomiting or stool incontinence Genito-Urinary ROS: negative for - dysuria, hematuria, incontinence or urinary frequency/urgency Musculoskeletal ROS: negative for - joint swelling or muscular weakness Neurological ROS: as noted in HPI Dermatological ROS: negative for rash and skin lesion changes  Physical Examination: Blood pressure 105/61, pulse 100, temperature 98.3 F (36.8 C), temperature source Oral, resp. rate 15, height 5\' 3"  (1.6 m), weight  67.586 kg (149 lb), SpO2 97 %, unknown if currently breastfeeding.  General Examination:  HEENT-  Normocephalic, no lesions, without obvious abnormality.  Normal external eye and conjunctiva.  Normal TM's bilaterally.  Normal auditory canals and external ears. Normal external nose, mucus membranes and  septum.  Normal pharynx. Cardiovascular- S1, S2 normal, pulses palpable throughout   Lungs- chest clear, no wheezing, rales, normal symmetric air entry Abdomen- soft, non-tender; bowel sounds normal; no masses,  no organomegaly Extremities- no edema Lymph-no adenopathy palpable Musculoskeletal-no joint tenderness, deformity or swelling Skin-warm and dry, no hyperpigmentation, vitiligo, or suspicious lesions  Neurological Examination Mental Status: Opens eyes when name called.  Squeezes left hand to command.  No verbalizations noted.  Cranial Nerves: II: patient does not respond confrontation bilaterally, pupils right 3 mm, left 3 mm,and reactive bilaterally III,IV,VI: doll's response present bilaterally.  V,VII: corneal reflex absent on the right  VIII: grossly intact IX,X: gag reflex reduced, XI: trapezius strength unable to test bilaterally XII: tongue strength unable to test Motor: Flaccid on the right upper and lower extremity.  Spontaneous right upper extremity movement.  No purposeful movements noted. Sensory: Does not respond to noxious stimuli on the right. Deep Tendon Reflexes:  Brisk throughout with sustained clonus bilaterally. Plantars: upgoing on the left.  Mute on the right Cerebellar: Unable to perform Gait: Unable to perform    Laboratory Studies:   Basic Metabolic Panel:  Recent Labs Lab 05/20/15 2145 05/20/15 2354  NA 140 140  K 4.5 3.5  CL 99* 104  CO2 31  --   GLUCOSE 102* 111*  BUN 15 13  CREATININE 0.57 0.60  CALCIUM 9.3  --     Liver Function Tests:  Recent Labs Lab 05/20/15 2145  AST 28  ALT 32  ALKPHOS 113  BILITOT 0.4  PROT 6.4*  ALBUMIN 3.7   No results for input(s): LIPASE, AMYLASE in the last 168 hours. No results for input(s): AMMONIA in the last 168 hours.  CBC:  Recent Labs Lab 05/20/15 2145 05/20/15 2337 05/20/15 2354  WBC 6.0  --   --   NEUTROABS  --  6.4  --   HGB 13.3  --  15.0  HCT 39.9  --  44.0  MCV 88.5   --   --   PLT 238  --   --     Cardiac Enzymes: No results for input(s): CKTOTAL, CKMB, CKMBINDEX, TROPONINI in the last 168 hours.  BNP: Invalid input(s): POCBNP  CBG: No results for input(s): GLUCAP in the last 168 hours.  Microbiology: Results for orders placed or performed in visit on 05/01/15  OB RESULT CONSOLE Group B Strep     Status: None   Collection Time: 04/20/15 12:00 AM  Result Value Ref Range Status   GBS Negative  Final    Coagulation Studies:  Recent Labs  05/20/15 2337  LABPROT 14.1  INR 1.07    Urinalysis:   Recent Labs Lab 05/20/15 2148  COLORURINE YELLOW  LABSPEC <1.005*  PHURINE 7.0  GLUCOSEU NEGATIVE  HGBUR TRACE*  BILIRUBINUR NEGATIVE  KETONESUR NEGATIVE  PROTEINUR NEGATIVE  UROBILINOGEN 0.2  NITRITE NEGATIVE  LEUKOCYTESUR NEGATIVE    Lipid Panel:  No results found for: CHOL, TRIG, HDL, CHOLHDL, VLDL, LDLCALC  HgbA1C: No results found for: HGBA1C  Urine Drug Screen:  No results found for: LABOPIA, COCAINSCRNUR, LABBENZ, AMPHETMU, THCU, LABBARB  Alcohol Level: No results for input(s): ETH in the last 168 hours.  Other results: EKG: sinus rhythm  at 97 bpm.  Imaging: Ct Head Wo Contrast  05/21/2015   CLINICAL DATA:  Severe headaches 2 weeks postpartum  EXAM: CT HEAD WITHOUT CONTRAST  TECHNIQUE: Contiguous axial images were obtained from the base of the skull through the vertex without intravenous contrast.  COMPARISON:  None.  FINDINGS: The ventricles are normal in size and configuration. There is a hemorrhage which appears to arise from the left parietal lobe measuring 6.2 x 3.1 cm. This hemorrhage tracks anteriorly and inferiorly throughout the splenium of the corpus callosum involving most of the splenium of the corpus callosum. Hemorrhage also tracks just superior to the left lateral ventricle. There is subarachnoid hemorrhage throughout much of the brain parenchyma in the supratentorial region on the left with a small amount of  subarachnoid hemorrhage crossing to the right slightly anterior to the rostrum of the corpus callosum.  There is no subdural or epidural fluid collection. There is no midline shift. Elsewhere gray-white compartments appear normal. The bony calvarium appears intact. The mastoid air cells are clear.  IMPRESSION: Extensive intraparenchymal hemorrhage which arises from the left parietal lobe and extends into the splenium of the corpus callosum and slightly superior to the left lateral ventricle. There is extensive subarachnoid hemorrhage on the left with a small amount of subarachnoid hemorrhage crossing to the right anteriorly. The ventricles are not efface, and there is no midline shift. There is no extra-axial fluid. Elsewhere gray-white compartments appear normal.  Critical Value/emergent results were called by telephone at the time of interpretation on 05/21/2015 at 12:12 am to Dr. Doy Mince, neurology, who verbally acknowledged these results.   Electronically Signed   By: Lowella Grip III M.D.   On: 05/21/2015 00:12    Assessment: 39 y.o. female presenting with headache and right hemiplegia.  Initially hypertensive.  Has developed focal seizure activity as well.  Head CT independently reviewed and shows a left high parietal ICH with subarachnoid extension and midline shift.  Concern is for a venous thrombosis.     Stroke Risk Factors - none  Plan: 1. MRV 2. PT consult, OT consult, Speech consult 3. Echocardiogram 4. Seizure precautions 5. Ativan prn 6. NPO until RN stroke swallow screen 7. Telemetry monitoring 8. Frequent neuro checks 9. Neurosurgery consult 10. Admission to NICU 11. Central line placement  This patient is critically ill and at significant risk of neurological worsening, death and care requires constant monitoring of vital signs, hemodynamics,respiratory and cardiac monitoring, neurological assessment, discussion with family, other specialists and medical decision making of  high complexity. I spent 120 minutes of neurocritical care time  in the care of  this patient.  Alexis Goodell, MD [facility] [phone] 05/21/2015  1:07 AM

## 2015-05-21 NOTE — Progress Notes (Signed)
Serum Na 146 at 14:40 reported to Dr. Leonie Man.  Plan to continue 3% NaCl at current rate = 75 ml/hr at this time, and to report the 20:40 Na result to the evening neurologist for further review and decision.

## 2015-05-21 NOTE — Consult Note (Signed)
Reason for Consult: Intracerebral hemorrhage Referring Physician: Dr. Alexis Goodell  Ashley [name] is an 39 y.o. female.  HPI: Patient is 39 year old female who is 2 weeks postpartum was found unresponsive and brought to the emergency department CT scan showed a high left parietal intracerebral hemorrhage with some subarachnoid hemorrhage neurology was called as well as neurosurgery.  Past Medical History  Diagnosis Date  . Anxiety     Past Surgical History  Procedure Laterality Date  . Dilation and curettage of uterus      Family History  Problem Relation Age of Onset  . Cancer Mother   . Cancer Father     Social History:  reports that she has never smoked. She has never used smokeless tobacco. She reports that she drinks alcohol. She reports that she does not use illicit drugs.  Allergies: No Known Allergies  Medications: I have reviewed the patient's current medications.  Results for orders placed or performed during the hospital encounter of 05/20/15 (from the past 48 hour(s))  Comprehensive metabolic panel     Status: Abnormal   Collection Time: 05/20/15  9:45 PM  Result Value Ref Range   Sodium 140 135 - 145 mmol/L   Potassium 4.5 3.5 - 5.1 mmol/L   Chloride 99 (L) 101 - 111 mmol/L   CO2 31 22 - 32 mmol/L   Glucose, Bld 102 (H) 65 - 99 mg/dL   BUN 15 6 - 20 mg/dL   Creatinine, Ser 0.57 0.44 - 1.00 mg/dL   Calcium 9.3 8.9 - 10.3 mg/dL   Total Protein 6.4 (L) 6.5 - 8.1 g/dL   Albumin 3.7 3.5 - 5.0 g/dL   AST 28 15 - 41 U/L   ALT 32 14 - 54 U/L   Alkaline Phosphatase 113 38 - 126 U/L   Total Bilirubin 0.4 0.3 - 1.2 mg/dL   GFR calc non Af Amer >60 >60 mL/min   GFR calc Af Amer >60 >60 mL/min    Comment: (NOTE) The eGFR has been calculated using the CKD EPI equation. This calculation has not been validated in all clinical situations. eGFR's persistently <60 mL/min signify possible Chronic Kidney Disease.    Anion gap 10 5 - 15  CBC     Status: None    Collection Time: 05/20/15  9:45 PM  Result Value Ref Range   WBC 6.0 4.0 - 10.5 K/uL   RBC 4.51 3.87 - 5.11 MIL/uL   Hemoglobin 13.3 12.0 - 15.0 g/dL   HCT 39.9 36.0 - 46.0 %   MCV 88.5 78.0 - 100.0 fL   MCH 29.5 26.0 - 34.0 pg   MCHC 33.3 30.0 - 36.0 g/dL   RDW 13.8 11.5 - 15.5 %   Platelets 238 150 - 400 K/uL  Urinalysis, Routine w reflex microscopic (not at [facility])     Status: Abnormal   Collection Time: 05/20/15  9:48 PM  Result Value Ref Range   Color, Urine YELLOW YELLOW   APPearance CLEAR CLEAR   Specific Gravity, Urine <1.005 (L) 1.005 - 1.030   pH 7.0 5.0 - 8.0   Glucose, UA NEGATIVE NEGATIVE mg/dL   Hgb urine dipstick TRACE (A) NEGATIVE   Bilirubin Urine NEGATIVE NEGATIVE   Ketones, ur NEGATIVE NEGATIVE mg/dL   Protein, ur NEGATIVE NEGATIVE mg/dL   Urobilinogen, UA 0.2 0.0 - 1.0 mg/dL   Nitrite NEGATIVE NEGATIVE   Leukocytes, UA NEGATIVE NEGATIVE  Urine microscopic-add on     Status: None   Collection Time: 05/20/15  9:48 PM  Result Value Ref Range   Squamous Epithelial / LPF RARE RARE   WBC, UA  <3 WBC/hpf    NO FORMED ELEMENTS SEEN ON URINE MICROSCOPIC EXAMINATION   RBC / HPF 0-2 <3 RBC/hpf   Bacteria, UA RARE RARE  Protime-INR     Status: None   Collection Time: 05/20/15 11:37 PM  Result Value Ref Range   Prothrombin Time 14.1 11.6 - 15.2 seconds   INR 1.07 0.00 - 1.49  APTT     Status: None   Collection Time: 05/20/15 11:37 PM  Result Value Ref Range   aPTT 31 24 - 37 seconds  Differential     Status: None   Collection Time: 05/20/15 11:37 PM  Result Value Ref Range   Neutrophils Relative % 65 43 - 77 %   Neutro Abs 6.4 1.7 - 7.7 K/uL   Lymphocytes Relative 29 12 - 46 %   Lymphs Abs 2.9 0.7 - 4.0 K/uL   Monocytes Relative 5 3 - 12 %   Monocytes Absolute 0.5 0.1 - 1.0 K/uL   Eosinophils Relative 1 0 - 5 %   Eosinophils Absolute 0.1 0.0 - 0.7 K/uL   Basophils Relative 0 0 - 1 %   Basophils Absolute 0.0 0.0 - 0.1 K/uL  I-stat troponin, ED (not at [facility],  [facility])     Status: None   Collection Time: 05/20/15 11:52 PM  Result Value Ref Range   Troponin i, poc 0.01 0.00 - 0.08 ng/mL   Comment 3            Comment: Due to the release kinetics of cTnI, a negative result within the first hours of the onset of symptoms does not rule out myocardial infarction with certainty. If myocardial infarction is still suspected, repeat the test at appropriate intervals.   I-Stat Chem 8, ED  (not at [facility])     Status: Abnormal   Collection Time: 05/20/15 11:54 PM  Result Value Ref Range   Sodium 140 135 - 145 mmol/L   Potassium 3.5 3.5 - 5.1 mmol/L   Chloride 104 101 - 111 mmol/L   BUN 13 6 - 20 mg/dL   Creatinine, Ser 0.60 0.44 - 1.00 mg/dL   Glucose, Bld 111 (H) 65 - 99 mg/dL   Calcium, Ion 1.10 (L) 1.12 - 1.23 mmol/L   TCO2 27 0 - 100 mmol/L   Hemoglobin 15.0 12.0 - 15.0 g/dL   HCT 44.0 36.0 - 46.0 %    Ct Head Wo Contrast  05/21/2015   CLINICAL DATA:  Severe headaches 2 weeks postpartum  EXAM: CT HEAD WITHOUT CONTRAST  TECHNIQUE: Contiguous axial images were obtained from the base of the skull through the vertex without intravenous contrast.  COMPARISON:  None.  FINDINGS: The ventricles are normal in size and configuration. There is a hemorrhage which appears to arise from the left parietal lobe measuring 6.2 x 3.1 cm. This hemorrhage tracks anteriorly and inferiorly throughout the splenium of the corpus callosum involving most of the splenium of the corpus callosum. Hemorrhage also tracks just superior to the left lateral ventricle. There is subarachnoid hemorrhage throughout much of the brain parenchyma in the supratentorial region on the left with a small amount of subarachnoid hemorrhage crossing to the right slightly anterior to the rostrum of the corpus callosum.  There is no subdural or epidural fluid collection. There is no midline shift. Elsewhere gray-white compartments appear normal. The bony calvarium appears intact. The mastoid air  cells  are clear.  IMPRESSION: Extensive intraparenchymal hemorrhage which arises from the left parietal lobe and extends into the splenium of the corpus callosum and slightly superior to the left lateral ventricle. There is extensive subarachnoid hemorrhage on the left with a small amount of subarachnoid hemorrhage crossing to the right anteriorly. The ventricles are not efface, and there is no midline shift. There is no extra-axial fluid. Elsewhere gray-white compartments appear normal.  Critical Value/emergent results were called by telephone at the time of interpretation on 05/21/2015 at 12:12 am to Dr. Doy Mince, neurology, who verbally acknowledged these results.   Electronically Signed   By: Lowella Grip III M.D.   On: 05/21/2015 00:12    Review of Systems  Unable to perform ROS  Blood pressure 105/60, pulse 103, temperature 98.3 F (36.8 C), temperature source Oral, resp. rate 14, height $RemoveBe'5\' 3"'[id number]$  (1.6 m), weight 67.586 kg (149 lb), SpO2 97 %, unknown if currently breastfeeding. Physical Exam  Neurological: She is unresponsive.  Patient is obtunded and unresponsive status post 2 mg of Ativan for seizure control pupils are equal some withdrawal to stimulation on the right reportedly more purposeful movement on the left prior to Ativan    Assessment/Plan: 39 year old female with intracerebral hemorrhage 2 weeks postpartum. This is a high parietal with some local mass effect but I do not think that this is surgical at this time. Recommend continued seizure management and observation follow-up CT in 8 hours MRI scan to include MRA and MRV feel this could likely be related to superior sagittal sinus thrombosis. However document dictation of this is more academic as we would not be able to anticoagulate.  Brenon Antosh P 05/21/2015, 12:45 AM

## 2015-05-21 NOTE — ED Notes (Signed)
Back from CT, Dr Doy Mince at the bedside

## 2015-05-21 NOTE — Progress Notes (Signed)
STROKE TEAM PROGRESS NOTE   HISTORY Ashley [name] is an 39 y.o. female who is two weeks post-partum who developed a headache today (05/20/2015 at 1600) that she felt was one of her migraines. The pain became so severe that she called the OB on call. OTC medications were tired with no relief and the patient presented to [facility] for evaluation. While being evaluated the patient developed right sided weakness. She eventually progressed to the point that she was also unable to communicate effectively. Code stroke was called and the patient was transferred to [facility]. On arrival patient with right focal seizure. Patient has been having headaches for the past couple of days. CT of the brain shows L parietal ICH w/ SAH and midline shift. She was admitted to the neuro ICU for further evaluation and treatment.   SUBJECTIVE (INTERVAL HISTORY) Her RN i and- husband at bedside. He informs me that patient was having back pain ever since her delivery 2 weeks ago and was taking multiple doses of ibuprofen daily. She had intermittent headaches for the last 1 week with a severe headache yesterday for which she took several tablets of Excedrin and then developed speech difficulties and right-sided weakness.   OBJECTIVE Temp:  [98.3 F (36.8 C)-98.4 F (36.9 C)] 98.4 F (36.9 C) (08/08 0300) Pulse Rate:  [56-111] 62 (08/08 0800) Cardiac Rhythm:  [-] Normal sinus rhythm (08/08 0800) Resp:  [12-24] 21 (08/08 0800) BP: (100-167)/(44-105) 117/65 mmHg (08/08 0800) SpO2:  [81 %-98 %] 95 % (08/08 0800) Weight:  [66.6 kg (146 lb 13.2 oz)-67.586 kg (149 lb)] 66.6 kg (146 lb 13.2 oz) (08/08 0300)  No results for input(s): GLUCAP in the last 168 hours.  Recent Labs Lab 05/20/15 2145 05/20/15 2354  NA 140 140  K 4.5 3.5  CL 99* 104  CO2 31  --   GLUCOSE 102* 111*  BUN 15 13  CREATININE 0.57 0.60  CALCIUM 9.3  --     Recent Labs Lab 05/20/15 2145  AST 28  ALT 32  ALKPHOS 113  BILITOT 0.4  PROT 6.4*   ALBUMIN 3.7    Recent Labs Lab 05/20/15 2145 05/20/15 2337 05/20/15 2354  WBC 6.0  --   --   NEUTROABS  --  6.4  --   HGB 13.3  --  15.0  HCT 39.9  --  44.0  MCV 88.5  --   --   PLT 238  --   --    No results for input(s): CKTOTAL, CKMB, CKMBINDEX, TROPONINI in the last 168 hours.  Recent Labs  05/20/15 2337  LABPROT 14.1  INR 1.07    Recent Labs  05/20/15 2148  COLORURINE YELLOW  LABSPEC <1.005*  PHURINE 7.0  GLUCOSEU NEGATIVE  HGBUR TRACE*  BILIRUBINUR NEGATIVE  KETONESUR NEGATIVE  PROTEINUR NEGATIVE  UROBILINOGEN 0.2  NITRITE NEGATIVE  LEUKOCYTESUR NEGATIVE    No results found for: CHOL, TRIG, HDL, CHOLHDL, VLDL, LDLCALC No results found for: HGBA1C No results found for: LABOPIA, COCAINSCRNUR, LABBENZ, AMPHETMU, THCU, LABBARB  No results for input(s): ETH in the last 168 hours.   Dg Chest Portable 1 View 05/21/2015   1. Left IJ line has been retracted, and is now seen ending overlying the proximal SVC. 2. No acute cardiopulmonary process seen.    05/21/2015   1. Left IJ line noted ending overlying the right brachiocephalic vein. This should be retracted at least 5 cm and repositioned, as deemed clinically appropriate. 2. Lungs hypoexpanded but grossly clear.  Ct Head Wo Contrast 05/21/2015    Extensive intraparenchymal hemorrhage which arises from the left parietal lobe and extends into the splenium of the corpus callosum and slightly superior to the left lateral ventricle. There is extensive subarachnoid hemorrhage on the left with a small amount of subarachnoid hemorrhage crossing to the right anteriorly. The ventricles are not efface, and there is no midline shift. There is no extra-axial fluid. Elsewhere gray-white compartments appear normal.    MRI HEAD 05/21/2015    Acute large LEFT parietal intraparenchymal hematoma dissecting along the corpus callosum, intraventricular extension without obstructive hydrocephalus. Scattered subarachnoid hemorrhage better  seen on today's CT head.  Findings suggest postpartum angiopathy ; postpartum angiopathy typically affects the medium and small vessels which are not well characterized on MRA of the head; follow-up CTA of the head or digital subtraction angiography could be performed as clinically indicated . Location is also typical for venous infarction though, there is no major dural venous sinus thrombosis. Additional consideration are vascular malformation or hemorrhagic mass.   MRA HEAD 05/21/2015    No acute vascular process ; normal MRA head.    MRV HEAD 05/21/2015    No dural venous sinus thrombosis ; normal noncontrast MRV head.     EEG Abnormal EEG due to the following: 1)Suppressed background rhythm which is likely related to medication 2)Left sided sharp wave activity consistent with Periodic Lateralized Epileptiform Discharges (PLEDs)   PHYSICAL EXAM Frail young Caucasian lady currently not in distress. She has EEG leads on her head. . Afebrile. Head is nontraumatic. Neck is supple without bruit.    Cardiac exam no murmur or gallop. Lungs are clear to auscultation. Distal pulses are well felt. Neurological Exam :  Patient is drowsy and stuporose and can barely open eyes when stimulated. Left gaze preference but able to look spontaneously to the right past midline. Does not blink to threat on either side. Fundi were not visualized. Pupils 3 mm equal reactive. Vision acuity cannot be reliably tested. Right lower facial weakness. Tongue midline. Dense right hemiplegia with withdrawal in the right lower extremity to pain and none in the right upper extremity with hypotonia. Purposeful antigravity movements on the left side. Plantars are both downgoing. Gait cannot be tested. ASSESSMENT/PLAN [name] is a 39 y.o. female 2 weeks post partum presenting with severe headaches for several days who developed right hemiparesis and aphasia with resultant right focal seizure in the ED.    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   Resultant  Headache, lethargy, Rt hemiplegia, partial seizures and abnormal EEG  MRI  Dominant left parietal ICH with cytotoxic cerebral edema, IVH and obstructive hydrocephalus, SAH, cannot rule out underlying mass or vascular malformation  MRA  Unremarkable  MRV Unremarkable   LDL pending   HgbA1c pending  SCDs for VTE prophylaxis Diet NPO time specified  no antithrombotic prior to admission  Start 3% saline for cytotoxic cerebral edema   Will treat edema and seizure, will not anticoagulate at this time  Therapy recommendations:  pending   Disposition:  pending   Seizure  EEG L PLEDS  On Keppra, loaded with 1000 mg, now on 750 q 12h  Other Stroke Risk Factors  ETOH use  Migraines  2 weeks post partum, lactating  Other Active Problems  Anxiety on prozac  Hospital day # 0 I have personally examined this patient, reviewed notes, independently viewed  imaging studies, participated in medical decision making and plan of care. I have made any additions or clarifications directly to the above note.  She has presented with 1 week of intermittent headache followed by speech difficulties and right-sided weakness due to his left frontal parasagittal parenchymal hemorrhage with subarachnoid extension and significant cytotoxic edema and early midline shift. Etiology of the hemorrhage isn't indeterminate but cortical vein thrombosis is a strong possibility even though MRV was unremarkable. The patient had been taking multiple doses of ibuprofen for the past few weeks which perhaps may have also contributing to the hemorrhage The patient's management presents a challenge as anticoagulation would be risky given her large intracerebral hematoma hence we will treat conservatively with and EDD now measures with hypertonic saline and induced hypernatremia.  Continue Keppra for seizures and repeat EEG in the morning. Repeat CT scan of the head in the morning or sooner if there is neurological worsening. I had a long discussion at the bedside with the patient's husband and mother and explained her prognosis, plan of care and answered questions. Appreciate neurosurgery consult. This patient is critically ill and at significant risk of neurological worsening, death and care requires constant monitoring of vital signs, hemodynamics,respiratory and cardiac monitoring, extensive review of multiple databases, frequent neurological assessment, discussion with family, other specialists and medical decision making of high complexity.I have made any additions or clarifications directly to the above note.This critical care time does not reflect procedure time, or teaching time or supervisory time of PA/NP/Med Resident etc but could involve care discussion time.  I spent 50 minutes of neurocritical care time  in the care of  this patient.    Antony Contras, MD Medical Director [facility] Pager: [phone] 05/21/2015 3:00 PM     To contact Stroke Continuity provider, please refer to [email or web address] After hours, contact General Neurology

## 2015-05-21 NOTE — Procedures (Addendum)
ELECTROENCEPHALOGRAM REPORT   Patient: Ashley [name]      Room #: 28M-09 Age: 39 y.o.        Sex: female Referring Physician: Dr Doy Mince Report Date:  05/21/2015        Interpreting Physician: Hulen Luster  History: Ashley [name] is an 39 y.o. female 2 weeks post partum presenting with headache, found to have a left parietal lobe ICH. Seizure activity noted.   Medications:  Scheduled: . hydrALAZINE  5 mg Intravenous Once  . ketorolac  30 mg Intravenous Once  . levETIRAcetam  500 mg Intravenous Q12H  . LORazepam      . pantoprazole (PROTONIX) IV  40 mg Intravenous QHS  . senna-docusate  1 tablet Oral BID    Conditions of Recording:  This is a 19 channel EEG carried out with the patient in the asleep state.  Description:  There is a suppressed low voltage background rhythm. Throughout the recording there is frequent muscle artifact located predominantly in the left frontal and temporal region.  There are frequent left sided sharp waves with an apparent F7-T3 focus. These occur at a 0.5 to 1 Hz frequency throughout the recording. No improvement with 2mg  of ativan.    Hyperventilation and intermittent photic stimulation were not performed.  IMPRESSION: Abnormal EEG due to the following: 1)Suppressed background rhythm which is likely related to medication 2)Left sided sharp wave activity consistent with Periodic Lateralized Epileptiform Discharges (PLEDs)   Jim Like, DO [facility] [phone]  If 7pm- 7am, please page neurology on call as listed in AMION. 05/21/2015, 6:07 AM

## 2015-05-21 NOTE — Progress Notes (Signed)
Reviewed lactation consultant's recommendations with pt's husband and mother.  They wish to continue formula feeding at this time and do not want to use the mother's pumped milk.  They understand that any decision for medication to stop lactation can only be made by the mother, and that we will continue to use the breast pump every 3 hours until that patient is able to make and communicate her own decision.

## 2015-05-21 NOTE — Progress Notes (Signed)
SLP Cancellation Note  Patient Details Name: Ashley [name] MRN: [id number] DOB: Jan 22, 1976   Cancelled treatment:       Reason Eval/Treat Not Completed: Medical issues which prohibited therapy (Per RN, patient not alert enough for evaluation. Will f/u. )  Gabriel Rainwater [place], CCC-SLP [phone]  [name] [name] 05/21/2015, 9:13 AM

## 2015-05-21 NOTE — Progress Notes (Signed)
Prolonged EEG complete. Results pending °

## 2015-05-21 NOTE — ED Notes (Signed)
Husband reports since vaginal delivery she has had 3 migraines.  Started today around 4 and husband called the OB.  Instructed to take Excedrin Migraine and benadryl.  Stated she took the meds but they did not seem to help.  Took her to [facility] to be seen.  The decision was made to transfer her here to be seen by Neuro.

## 2015-05-21 NOTE — Progress Notes (Signed)
Spoke with Science writer at [employer].  Advised to continue pumping every 3 hours until patient able to decide on own if choosing to continue to pump.  Medications patient is on discussed with consultant. Currently all medications are L1 and L2 compatible on the lactation risk categories except for Ativan which is classified as a L3.  Also, she was unable to find information regarding hypertonic saline category and therefore recommended we speak with one of our pharmacists.  Pharmacist recommended that the newborn's pediatrician notified and follow their recommendations.  Information was passed along to patient's nurse who will convey to patient's husband.

## 2015-05-21 NOTE — Code Documentation (Signed)
Mrs. Ashley [name] presented to [facility] after developing a HA around 1600.  Per her husband she has had 2 prior HAs since having her son 2wks ago.  While at [facility] she began to develop Rt side weakness & expressive aphasia.  She was tx to [facility] for further evaluation.  [facility] transported.  On the way to the hospital she developed a Rt gaze and began to have seizure activity.  On arrival to [facility] she was still seizing and she was treated with Ativan.. Plan MRI & tx to 64M

## 2015-05-22 ENCOUNTER — Encounter (HOSPITAL_COMMUNITY): Payer: Self-pay

## 2015-05-22 ENCOUNTER — Inpatient Hospital Stay (HOSPITAL_COMMUNITY): Payer: BLUE CROSS/BLUE SHIELD

## 2015-05-22 DIAGNOSIS — G936 Cerebral edema: Secondary | ICD-10-CM

## 2015-05-22 DIAGNOSIS — G40309 Generalized idiopathic epilepsy and epileptic syndromes, not intractable, without status epilepticus: Secondary | ICD-10-CM

## 2015-05-22 DIAGNOSIS — I615 Nontraumatic intracerebral hemorrhage, intraventricular: Secondary | ICD-10-CM

## 2015-05-22 DIAGNOSIS — G08 Intracranial and intraspinal phlebitis and thrombophlebitis: Secondary | ICD-10-CM

## 2015-05-22 DIAGNOSIS — I611 Nontraumatic intracerebral hemorrhage in hemisphere, cortical: Secondary | ICD-10-CM

## 2015-05-22 LAB — SODIUM
Sodium: 150 mmol/L — ABNORMAL HIGH (ref 135–145)
Sodium: 148 mmol/L — ABNORMAL HIGH (ref 135–145)
Sodium: 147 mmol/L — ABNORMAL HIGH (ref 135–145)
Sodium: 147 mmol/L — ABNORMAL HIGH (ref 135–145)

## 2015-05-22 LAB — ANTITHROMBIN III: AntiThromb III Func: 119 % (ref 75–120)

## 2015-05-22 MED ORDER — ONDANSETRON HCL 4 MG/2ML IJ SOLN
4.0000 mg | Freq: Four times a day (QID) | INTRAMUSCULAR | Status: DC | PRN
  Administered 2015-05-24: 4 mg via INTRAVENOUS
  Filled 2015-05-22: qty 2

## 2015-05-22 NOTE — Evaluation (Signed)
Occupational Therapy Evaluation Patient Details Name: Ashley [name] MRN: [id number] DOB: December 10, 1975 Today's Date: 05/22/2015    History of Present Illness Ashley [name] is an 39 y.o. female who is two weeks post-partum who developed a headache; CT of the brain shows L parietal ICH w/ SAH and midline shift.   Clinical Impression   Pt admitted with above. She demonstrates the below listed deficits and will benefit from continued OT to maximize safety and independence with BADLs,  Pt presents with Rt hemipareses, ? Rt inattention vs field deficit, ideomotor apraxia, increased extensor spasticity Rt side as well as communication deficits.  Currently, she requires max A for ADLs.  Feel she will benefit from CIR to allow her to maximize independence with ADLs      Follow Up Recommendations  CIR;Supervision/Assistance - 24 hour    Equipment Recommendations  3 in 1 bedside comode;Tub/shower bench    Recommendations for Other Services Rehab consult     Precautions / Restrictions Precautions Precautions: Fall Precaution Comments: right extensor tone      Mobility Bed Mobility Overal bed mobility: Needs Assistance Bed Mobility: Supine to Sit     Supine to sit: Max assist     General bed mobility comments: assist for moving right side to EOB and lifting trunk upright  Transfers Overall transfer level: Needs assistance Equipment used: 2 person hand held assist Transfers: Squat Pivot Transfers     Squat pivot transfers: Max assist;+2 physical assistance     General transfer comment: increased time, pt able to bear weight for transfer, but needs assist for pivoting hips    Balance Overall balance assessment: Needs assistance   Sitting balance-Leahy Scale: Poor Sitting balance - Comments: min to mod A  Postural control: Posterior lean   Standing balance-Leahy Scale: Zero                              ADL Overall ADL's : Needs  assistance/impaired Eating/Feeding: Maximal assistance;Sitting   Grooming: Wash/dry hands;Wash/dry face;Brushing hair;Maximal assistance;Moderate assistance Grooming Details (indicate cue type and reason): Pt able to identify "comb".  but unable to demonstrate use initially.  With verbal cues and gestures, she was able to demonstrate use, but demonstrated signfiicant difficulty positioning comb appropriately in relation to hair/head requiring hand over hand assist to complete task  Upper Body Bathing: Maximal assistance;Sitting   Lower Body Bathing: Total assistance;Sit to/from stand   Upper Body Dressing : Total assistance;Sitting   Lower Body Dressing: Total assistance;Sit to/from stand;Sitting/lateral leans   Toilet Transfer: Maximal assistance;+2 for safety/equipment;Squat-pivot;BSC   Toileting- Clothing Manipulation and Hygiene: Total assistance;Sitting/lateral lean       Functional mobility during ADLs: Maximal assistance;+2 for physical assistance       Vision Additional Comments: Pt unable to participate in formal assessment due to language deficits.  She does look to Rt and Lt at people in room.  She was able to locate therapist's hand 2/5 trials on the right    Perception Perception Comments: questionable Rt inattention vs ? Rt visual field deficit    Praxis Praxis Praxis tested?: Deficits Deficits: Ideomotor;Perseveration    Pertinent Vitals/Pain Pain Assessment: Faces Faces Pain Scale: Hurts a little bit Pain Location: headache Pain Descriptors / Indicators: Grimacing Pain Intervention(s): Monitored during session     Hand Dominance     Extremity/Trunk Assessment Upper Extremity Assessment Upper Extremity Assessment: RUE deficits/detail RUE Deficits / Details: Pt demonstrates full PROM, but extensor spasticity  present.  Pt does seem to be able to inhibit spasticity when asked to perform a movement with her arm, was able to passively guide her with no  increased spasticity  RUE Coordination: decreased fine motor;decreased gross motor   Lower Extremity Assessment Lower Extremity Assessment: Defer to PT evaluation RLE Deficits / Details: excessive PF tone with continuous clonus with stretch to achilles, unable to move past about 30 degrees plantarflexion even with knee flexed, increased tone throughout rest of leg, but less than at ankle, no voluntary movement noted       Communication Communication Communication: Receptive difficulties;Expressive difficulties   Cognition Arousal/Alertness: Awake/alert Behavior During Therapy: Flat affect Overall Cognitive Status: Difficult to assess Area of Impairment: Following commands       Following Commands: Follows one step commands inconsistently       General Comments: Pt follows one step commands inconsitently.   She appeared to recognize infant son, and interacted appropriately with him within confines of physical and communication impairments    General Comments       Exercises       Shoulder Instructions      Home Living Family/patient expects to be discharged to:: Inpatient rehab Living Arrangements: Spouse/significant other;Children Available Help at Discharge: Family;Available 24 hours/day Type of Home: House Home Access: Stairs to enter [employer] of Steps: 3-4 in front and one via garage   Home Layout: Two level;Bed/bath upstairs Alternate Level Stairs-Number of Steps: full flight        Bathroom Toilet: Standard     Home Equipment: None   Additional Comments: Pt lives with spouse, and 55 week old son.  She shares custody of children ages 43 and 52.  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  She runs frequently     OT Diagnosis:  Generalized weakness;Cognitive deficits;Disturbance of vision;Hemiplegia dominant side;Apraxia   OT Problem List: Decreased strength;Decreased range of motion;Decreased activity tolerance;Impaired balance (sitting and/or standing);Impaired vision/perception;Decreased coordination;Decreased cognition;Decreased safety awareness;Decreased knowledge of use of DME or AE;Decreased knowledge of precautions;Impaired tone;Impaired UE functional use;Pain   OT Treatment/Interventions: Self-care/ADL training;Neuromuscular education;DME and/or AE instruction;Manual therapy;Splinting;Therapeutic activities;Cognitive remediation/compensation;Visual/perceptual remediation/compensation;Patient/family education;Balance training    OT Goals(Current goals can be found in the care plan section) Acute Rehab OT Goals Patient Stated Goal: unable to state OT Goal Formulation: Patient unable to participate in goal setting Time For Goal Achievement: 06/05/15 Potential to Achieve Goals: Good ADL Goals Pt Will Perform Eating: with set-up;with supervision;sitting Pt Will Perform Grooming: sitting;with supervision Pt Will Perform Upper Body Bathing: with min assist;sitting Pt Will Transfer to Toilet: with mod assist;stand pivot transfer;bedside commode Pt Will Perform Toileting - Clothing Manipulation and hygiene: with mod assist;sit to/from stand Pt/caregiver will Perform Home Exercise Program: Increased ROM;Right Upper extremity;With Supervision;With written HEP provided (Family will be independent with ROM ) Additional ADL Goal #1: Pt will use Rt UE as stabilizer/ support during ADL activities.  Additional ADL Goal #2: Pt will maintain dynamic sitting balance with min A in prep for increased independence with ADLs.  Additional ADL Goal #4: Pt will locate ADL items on Rt side with min verbal cues.   OT Frequency: Min 3X/week   Barriers to D/C:  Co-evaluation              End of Session Nurse  Communication: Mobility status  Activity Tolerance: Patient tolerated treatment well Patient left: in chair;with call bell/phone within reach;with chair alarm set   Time: 1248-1311 OT Time Calculation (min): 23 min Charges:  OT General Charges $OT Visit: 1 Procedure OT Evaluation $Initial OT Evaluation Tier I: 1 Procedure OT Treatments $Self Care/Home Management : 8-22 mins G-Codes:    Ashley [name] M 2015/06/03, 2:43 PM

## 2015-05-22 NOTE — Progress Notes (Signed)
Subjective: Patient reports Much better this morning awake alert confused and dysphasic  Objective: Vital signs in last 24 hours: Temp:  [98 F (36.7 C)-99.8 F (37.7 C)] 98 F (36.7 C) (08/09 0359) Pulse Rate:  [56-72] 61 (08/09 0600) Resp:  [5-27] 19 (08/09 0600) BP: (117-145)/(59-76) 145/74 mmHg (08/09 0600) SpO2:  [93 %-99 %] 95 % (08/09 0600)  Intake/Output from previous day: 08/08 0701 - 08/09 0700 In: 1707.5 [I.V.:1445; IV Piggyback:262.5] Out: 1570 [Urine:1540] Intake/Output this shift:    Awake alert expressive dysphasia left hemiparesis but significantly more awake and improved  Lab Results:  Recent Labs  05/20/15 2145 05/20/15 2354  WBC 6.0  --   HGB 13.3 15.0  HCT 39.9 44.0  PLT 238  --    BMET  Recent Labs  05/20/15 2145 05/20/15 2354  05/21/15 2050 05/22/15 0316  NA 140 140  < > 149* 150*  K 4.5 3.5  --   --   --   CL 99* 104  --   --   --   CO2 31  --   --   --   --   GLUCOSE 102* 111*  --   --   --   BUN 15 13  --   --   --   CREATININE 0.57 0.60  --   --   --   CALCIUM 9.3  --   --   --   --   < > = values in this interval not displayed.  Studies/Results: Ct Head Wo Contrast  05/22/2015   CLINICAL DATA:  39 year old female with intracranial hemorrhage. Follow-up. Subsequent encounter.  EXAM: CT HEAD WITHOUT CONTRAST  TECHNIQUE: Contiguous axial images were obtained from the base of the skull through the vertex without intravenous contrast.  COMPARISON:  05/21/2015 MR and 05/20/2015 CT.  FINDINGS: Large left posterior frontal -parietal lobe hematoma with extension into the corpus callosum has overall size similar to the prior exam. Interval progression of surrounding vasogenic edema. Mass effect upon the lateral ventricles greater on the left with downward compression better appreciated on recent MR which included coronal imaging. Minimal bowing of the septum towards the right.  Extensive subarachnoid hemorrhage greatest left hemisphere most  prominent left sylvian fissure and left convexity minimal changed from prior exam.  Tiny amount of deep dependent interventricular blood similar to the MR.  Cause of intracranial hemorrhage is indeterminate.  No CT evidence of large acute thrombotic infarct.  No calvarial abnormality.  Mastoid air cells, middle ear cavities and visualized paranasal sinuses are clear.  IMPRESSION: Large left posterior frontal -parietal lobe hematoma with extension into the corpus callosum has overall size similar to the prior exam. Interval progression of surrounding vasogenic edema. Mass effect upon the lateral ventricles greater on the left with downward compression better appreciated on recent MR which included coronal imaging. Minimal bowing of the septum towards the right.  Extensive subarachnoid hemorrhage greatest left hemisphere most prominent left sylvian fissure and left convexity minimal changed from prior exam.  Tiny amount of deep dependent interventricular blood similar to the MR.  Cause of intracranial hemorrhage is indeterminate.   Electronically Signed   By: Genia Del M.D.   On: 05/22/2015 07:26   Ct Head Wo Contrast  05/21/2015   CLINICAL DATA:  Severe headaches 2 weeks postpartum  EXAM: CT HEAD WITHOUT CONTRAST  TECHNIQUE: Contiguous axial images were obtained from the base of the skull through the vertex without intravenous contrast.  COMPARISON:  None.  FINDINGS: The ventricles are normal in size and configuration. There is a hemorrhage which appears to arise from the left parietal lobe measuring 6.2 x 3.1 cm. This hemorrhage tracks anteriorly and inferiorly throughout the splenium of the corpus callosum involving most of the splenium of the corpus callosum. Hemorrhage also tracks just superior to the left lateral ventricle. There is subarachnoid hemorrhage throughout much of the brain parenchyma in the supratentorial region on the left with a small amount of subarachnoid hemorrhage crossing to the right  slightly anterior to the rostrum of the corpus callosum.  There is no subdural or epidural fluid collection. There is no midline shift. Elsewhere gray-white compartments appear normal. The bony calvarium appears intact. The mastoid air cells are clear.  IMPRESSION: Extensive intraparenchymal hemorrhage which arises from the left parietal lobe and extends into the splenium of the corpus callosum and slightly superior to the left lateral ventricle. There is extensive subarachnoid hemorrhage on the left with a small amount of subarachnoid hemorrhage crossing to the right anteriorly. The ventricles are not efface, and there is no midline shift. There is no extra-axial fluid. Elsewhere gray-white compartments appear normal.  Critical Value/emergent results were called by telephone at the time of interpretation on 05/21/2015 at 12:12 am to Dr. Doy Mince, neurology, who verbally acknowledged these results.   Electronically Signed   By: Lowella Grip III M.D.   On: 05/21/2015 00:12   Mr Jodene Nam Head Wo Contrast  05/21/2015   CLINICAL DATA:  Headache today, worse than typical migraine. Acute onset RIGHT-sided weakness. Acute RIGHT focal seizure in emergency department. Two weeks postpartum. Evaluate intracranial hemorrhage.  EXAM: MRI HEAD WITHOUT CONTRAST  MRA HEAD WITHOUT CONTRAST  MRV HEAD WITHOUT CONTRAST  TECHNIQUE: Multiplanar, multiecho pulse sequences of the brain and surrounding structures were obtained without and with intravenous contrast. Angiographic images of the head were obtained using MRA and MRV technique without contrast. MIP images provided.  COMPARISON:  CT head May 20, 2015 at 2354 hours  FINDINGS: MRI HEAD FINDINGS  Acute mesial LEFT parietal intraparenchymal hematoma better seen on prior CT, dissecting along the body of the corpus callosum to the RIGHT. Intraventricular extension with layering blood products in the LEFT lateral ventricle. No hydrocephalus. Minimal surrounding T2 bright vasogenic  edema, local sulcal effacement without midline shift. Mass effect on LEFT lateral ventricle. No satellite areas of abnormal parenchymal signal. Susceptibility artifact along the interhemispheric fissure, LEFT sylvian fissure in LEFT cerebellar tentorium consistent with acute blood products seen on today's CT. Bilateral hippocampi demonstrate normal size, morphology and signal characteristics.  Ocular globes and orbital contents are unremarkable. Paranasal sinuses and mastoid air cells are well aerated. No abnormal sellar expansion. No cerebellar tonsillar ectopia. No suspicious calvarial bone marrow signal.  MRA HEAD FINDINGS  Anterior circulation: Normal flow related enhancement of the included cervical, petrous, cavernous and supra clinoid internal carotid arteries. Patent anterior communicating artery. Normal flow related enhancement of the anterior and middle cerebral arteries, including more distal segments. RIGHT AC 1 segment is dominant.  No large vessel occlusion, high-grade stenosis, abnormal luminal irregularity, aneurysm.  Posterior circulation: Codominant vertebral arteries. Basilar artery is patent, with normal flow related enhancement of the main branch vessels. Patent posterior cerebral arteries. Slightly decreased and flow related enhancement of the distal LEFT posterior cerebral artery though, with normal in caliber, likely representing slow flow secondary to local mass effect.  No large vessel occlusion, high-grade stenosis, abnormal luminal irregularity, aneurysm.  MRV HEAD FINDINGS  Normal flow related enhancement  within the superior sagittal sinus, torcula of the Herophili, bilateral transverse, sigmoid sinuses and included internal jugular veins. Normal flow related enhancement of the internal cerebral veins.  IMPRESSION: MRI HEAD: Acute large LEFT parietal intraparenchymal hematoma dissecting along the corpus callosum, intraventricular extension without obstructive hydrocephalus. Scattered  subarachnoid hemorrhage better seen on today's CT head.  Findings suggest postpartum angiopathy ; postpartum angiopathy typically affects the medium and small vessels which are not well characterized on MRA of the head; follow-up CTA of the head or digital subtraction angiography could be performed as clinically indicated . Location is also typical for venous infarction though, there is no major dural venous sinus thrombosis. Additional consideration are vascular malformation or hemorrhagic mass. Recommend followup MRI of the head with contrast in 2-4 weeks.  MRA HEAD: No acute vascular process ; normal MRA head.  MRV HEAD: No dural venous sinus thrombosis ; normal noncontrast MRV head.   Electronically Signed   By: Elon Alas M.D.   On: 05/21/2015 03:35   Mr Brain Wo Contrast  05/21/2015   CLINICAL DATA:  Headache today, worse than typical migraine. Acute onset RIGHT-sided weakness. Acute RIGHT focal seizure in emergency department. Two weeks postpartum. Evaluate intracranial hemorrhage.  EXAM: MRI HEAD WITHOUT CONTRAST  MRA HEAD WITHOUT CONTRAST  MRV HEAD WITHOUT CONTRAST  TECHNIQUE: Multiplanar, multiecho pulse sequences of the brain and surrounding structures were obtained without and with intravenous contrast. Angiographic images of the head were obtained using MRA and MRV technique without contrast. MIP images provided.  COMPARISON:  CT head May 20, 2015 at 2354 hours  FINDINGS: MRI HEAD FINDINGS  Acute mesial LEFT parietal intraparenchymal hematoma better seen on prior CT, dissecting along the body of the corpus callosum to the RIGHT. Intraventricular extension with layering blood products in the LEFT lateral ventricle. No hydrocephalus. Minimal surrounding T2 bright vasogenic edema, local sulcal effacement without midline shift. Mass effect on LEFT lateral ventricle. No satellite areas of abnormal parenchymal signal. Susceptibility artifact along the interhemispheric fissure, LEFT sylvian fissure  in LEFT cerebellar tentorium consistent with acute blood products seen on today's CT. Bilateral hippocampi demonstrate normal size, morphology and signal characteristics.  Ocular globes and orbital contents are unremarkable. Paranasal sinuses and mastoid air cells are well aerated. No abnormal sellar expansion. No cerebellar tonsillar ectopia. No suspicious calvarial bone marrow signal.  MRA HEAD FINDINGS  Anterior circulation: Normal flow related enhancement of the included cervical, petrous, cavernous and supra clinoid internal carotid arteries. Patent anterior communicating artery. Normal flow related enhancement of the anterior and middle cerebral arteries, including more distal segments. RIGHT AC 1 segment is dominant.  No large vessel occlusion, high-grade stenosis, abnormal luminal irregularity, aneurysm.  Posterior circulation: Codominant vertebral arteries. Basilar artery is patent, with normal flow related enhancement of the main branch vessels. Patent posterior cerebral arteries. Slightly decreased and flow related enhancement of the distal LEFT posterior cerebral artery though, with normal in caliber, likely representing slow flow secondary to local mass effect.  No large vessel occlusion, high-grade stenosis, abnormal luminal irregularity, aneurysm.  MRV HEAD FINDINGS  Normal flow related enhancement within the superior sagittal sinus, torcula of the Herophili, bilateral transverse, sigmoid sinuses and included internal jugular veins. Normal flow related enhancement of the internal cerebral veins.  IMPRESSION: MRI HEAD: Acute large LEFT parietal intraparenchymal hematoma dissecting along the corpus callosum, intraventricular extension without obstructive hydrocephalus. Scattered subarachnoid hemorrhage better seen on today's CT head.  Findings suggest postpartum angiopathy ; postpartum angiopathy typically affects the medium and  small vessels which are not well characterized on MRA of the head;  follow-up CTA of the head or digital subtraction angiography could be performed as clinically indicated . Location is also typical for venous infarction though, there is no major dural venous sinus thrombosis. Additional consideration are vascular malformation or hemorrhagic mass. Recommend followup MRI of the head with contrast in 2-4 weeks.  MRA HEAD: No acute vascular process ; normal MRA head.  MRV HEAD: No dural venous sinus thrombosis ; normal noncontrast MRV head.   Electronically Signed   By: Elon Alas M.D.   On: 05/21/2015 03:35   Dg Chest Portable 1 View  05/21/2015   CLINICAL DATA:  Central line repositioning.  Initial encounter.  EXAM: PORTABLE CHEST - 1 VIEW  COMPARISON:  None.  FINDINGS: The patient's left IJ line has been retracted, and is now seen ending overlying the proximal SVC.  The lungs are well-aerated and clear. There is no evidence of focal opacification, pleural effusion or pneumothorax.  The cardiomediastinal silhouette is borderline normal in size. No acute osseous abnormalities are seen.  IMPRESSION: 1. Left IJ line has been retracted, and is now seen ending overlying the proximal SVC. 2. No acute cardiopulmonary process seen.   Electronically Signed   By: Garald Balding M.D.   On: 05/21/2015 02:21   Dg Chest Portable 1 View  05/21/2015   CLINICAL DATA:  Left central line placement.  Initial encounter.  EXAM: PORTABLE CHEST - 1 VIEW  COMPARISON:  None.  FINDINGS: The patient's left IJ line is noted extending overlying the right brachiocephalic vein. This should be retracted at least 5 cm and repositioned.  The lungs are hypoexpanded. No pleural effusion or pneumothorax is seen.  The cardiomediastinal silhouette is borderline normal in size. No acute osseous abnormalities are identified.  IMPRESSION: 1. Left IJ line noted ending overlying the right brachiocephalic vein. This should be retracted at least 5 cm and repositioned, as deemed clinically appropriate. 2. Lungs  hypoexpanded but grossly clear.  These results were called by telephone at the time of interpretation on 05/21/2015 at 2:15 am to [facility], who verbally acknowledged these results.   Electronically Signed   By: Garald Balding M.D.   On: 05/21/2015 02:15   Mr Mrv Head Wo Cm  05/21/2015   CLINICAL DATA:  Headache today, worse than typical migraine. Acute onset RIGHT-sided weakness. Acute RIGHT focal seizure in emergency department. Two weeks postpartum. Evaluate intracranial hemorrhage.  EXAM: MRI HEAD WITHOUT CONTRAST  MRA HEAD WITHOUT CONTRAST  MRV HEAD WITHOUT CONTRAST  TECHNIQUE: Multiplanar, multiecho pulse sequences of the brain and surrounding structures were obtained without and with intravenous contrast. Angiographic images of the head were obtained using MRA and MRV technique without contrast. MIP images provided.  COMPARISON:  CT head May 20, 2015 at 2354 hours  FINDINGS: MRI HEAD FINDINGS  Acute mesial LEFT parietal intraparenchymal hematoma better seen on prior CT, dissecting along the body of the corpus callosum to the RIGHT. Intraventricular extension with layering blood products in the LEFT lateral ventricle. No hydrocephalus. Minimal surrounding T2 bright vasogenic edema, local sulcal effacement without midline shift. Mass effect on LEFT lateral ventricle. No satellite areas of abnormal parenchymal signal. Susceptibility artifact along the interhemispheric fissure, LEFT sylvian fissure in LEFT cerebellar tentorium consistent with acute blood products seen on today's CT. Bilateral hippocampi demonstrate normal size, morphology and signal characteristics.  Ocular globes and orbital contents are unremarkable. Paranasal sinuses and mastoid air cells are  well aerated. No abnormal sellar expansion. No cerebellar tonsillar ectopia. No suspicious calvarial bone marrow signal.  MRA HEAD FINDINGS  Anterior circulation: Normal flow related enhancement of the included cervical, petrous, cavernous  and supra clinoid internal carotid arteries. Patent anterior communicating artery. Normal flow related enhancement of the anterior and middle cerebral arteries, including more distal segments. RIGHT AC 1 segment is dominant.  No large vessel occlusion, high-grade stenosis, abnormal luminal irregularity, aneurysm.  Posterior circulation: Codominant vertebral arteries. Basilar artery is patent, with normal flow related enhancement of the main branch vessels. Patent posterior cerebral arteries. Slightly decreased and flow related enhancement of the distal LEFT posterior cerebral artery though, with normal in caliber, likely representing slow flow secondary to local mass effect.  No large vessel occlusion, high-grade stenosis, abnormal luminal irregularity, aneurysm.  MRV HEAD FINDINGS  Normal flow related enhancement within the superior sagittal sinus, torcula of the Herophili, bilateral transverse, sigmoid sinuses and included internal jugular veins. Normal flow related enhancement of the internal cerebral veins.  IMPRESSION: MRI HEAD: Acute large LEFT parietal intraparenchymal hematoma dissecting along the corpus callosum, intraventricular extension without obstructive hydrocephalus. Scattered subarachnoid hemorrhage better seen on today's CT head.  Findings suggest postpartum angiopathy ; postpartum angiopathy typically affects the medium and small vessels which are not well characterized on MRA of the head; follow-up CTA of the head or digital subtraction angiography could be performed as clinically indicated . Location is also typical for venous infarction though, there is no major dural venous sinus thrombosis. Additional consideration are vascular malformation or hemorrhagic mass. Recommend followup MRI of the head with contrast in 2-4 weeks.  MRA HEAD: No acute vascular process ; normal MRA head.  MRV HEAD: No dural venous sinus thrombosis ; normal noncontrast MRV head.   Electronically Signed   By: Elon Alas M.D.   On: 05/21/2015 03:35    Assessment/Plan: Continue antiseizure management per neurology no neurosurgical recommendations at this time.  LOS: 1 day     Teirra Carapia P 05/22/2015, 7:32 AM

## 2015-05-22 NOTE — Evaluation (Signed)
Clinical/Bedside Swallow Evaluation Patient Details  Name: Ashley [name] MRN: [id number] Date of Birth: August 09, 1976  Today's Date: 05/22/2015 Time: SLP Start Time (ACUTE ONLY): 0901 SLP Stop Time (ACUTE ONLY): 0918 SLP Time Calculation (min) (ACUTE ONLY): 17 min  Past Medical History:  Past Medical History  Diagnosis Date  . Anxiety    Past Surgical History:  Past Surgical History  Procedure Laterality Date  . Dilation and curettage of uterus     HPI:  39 y.o. female who is two weeks post-partum with headache.  ED 05/20/2015 - right weakness, aphasia.  CT of the brain shows L parietal ICH w/ SAH and midline shift.    Assessment / Plan / Recommendation Clinical Impression  Pt participated in portion of swallow assessment  when she began to vomit and evaluation ended.  Limited assessment revealed adequate CN function for oral prep/control, intact sensation, brisk swallow response with water and purees and no s/s of aspiration.  Recommend initiating clear liquids as pt is able to tolerate given N/V.     Will return for aphasia assessment- pt follows one-step commands intermittently; perseverating on "hub" this morning. Some islands of preserved, automatic speech noted.     Aspiration Risk  Mild    Diet Recommendation  (clear liquids)   Medication Administration: Whole meds with liquid Compensations: Slow rate;Small sips/bites    Other  Recommendations Oral Care Recommendations: Oral care BID   Follow Up Recommendations       Frequency and Duration min 2x/week  1 week       SLP Swallow Goals     Swallow Study Prior Functional Status       General Date of Onset: 05/20/15 Other Pertinent Information: 39 y.o. female who is two weeks post-partum with headache.  ED 05/20/2015 - right weakness, aphasia.  CT of the brain shows L parietal ICH w/ SAH and midline shift.  Type of Study: Bedside swallow evaluation Previous Swallow Assessment: no Diet Prior to this Study:  NPO Temperature Spikes Noted: No Respiratory Status: Room air History of Recent Intubation: No Behavior/Cognition: Alert;Confused Oral Cavity - Dentition: Adequate natural dentition/normal for age Self-Feeding Abilities: Needs assist Patient Positioning: Upright in bed Baseline Vocal Quality: Normal Volitional Cough: Cognitively unable to elicit Volitional Swallow: Unable to elicit    Oral/Motor/Sensory Function Overall Oral Motor/Sensory Function: Impaired (mild right lower facial weakness; tongue midline; sensation )   Ice Chips Ice chips: Within functional limits Presentation: Spoon   Thin Liquid Thin Liquid: Within functional limits Presentation: Cup    Nectar Thick Nectar Thick Liquid: Not tested   Honey Thick Honey Thick Liquid: Not tested   Puree Puree: Within functional limits   Solid   Ashley [name] L. [place], [place] CCC/SLP Pager [phone]     Solid: Not tested       Ashley [name] 05/22/2015,9:36 AM

## 2015-05-22 NOTE — Procedures (Signed)
EEG report.  Brief clinical history:  39 y.o. female 2 weeks post partum presenting with headache, found to have a left parietal lobe ICH. Clinical seizure activity noted and prior prolonged EEG revealed left PLED's.  Technique: this is a 17 channel routine scalp EEG performed at the bedside with bipolar and monopolar montages arranged in accordance to the international 10/20 system of electrode placement. One channel was dedicated to EKG recording.  The study was performed predominantly during wakefulness. No activating procedures performed.  Description: the overall record seems to be disorganized with an admixture of lower high amplitude theta activity over the left hemisphere and higher delta and frequent alpha in the right hemisphere. The previously identified left PLED's are not longer visualized but instead rare sharp and high amplitude left anterior temporal spikes were observed. No electrographic seizures noted. EKG showed sinus rhythm.  Impression: this is an abnormal EEG with findings suggestive of global cerebral dysfunction more localized over the left hemisphere, as well rare epileptiform discharges over the left anterior temporal region which can be reflection of a potential epileptogenic focus. Importantly, no electrographic seizures seen and the previously identified left PLED's are not longer present. Clinical correlation is advised.   Dorian Pod, MD

## 2015-05-22 NOTE — Progress Notes (Addendum)
STROKE TEAM PROGRESS NOTE   HISTORY Ashley [name] is an 39 y.o. female who is two weeks post-partum who developed a headache today (05/20/2015 at 1600) that she felt was one of her migraines. The pain became so severe that she called the OB on call. OTC medications were tired with no relief and the patient presented to [facility] for evaluation. While being evaluated the patient developed right sided weakness. She eventually progressed to the point that she was also unable to communicate effectively. Code stroke was called and the patient was transferred to [facility]. On arrival patient with right focal seizure. Patient has been having headaches for the past couple of days. CT of the brain shows L parietal ICH w/ SAH and midline shift. She was admitted to the neuro ICU for further evaluation and treatment.   SUBJECTIVE (INTERVAL HISTORY) Her  Mother and- husband at bedside.  She is more alert today. She has not had any further seizures. She remains aphasic with dense right hemiplegia. Repeat CT scan of the head shows stable appearance of the hemorrhage with increased cytotoxic edema but no intraventricular extension  OBJECTIVE Temp:  [98 F (36.7 C)-99.8 F (37.7 C)] 98.2 F (36.8 C) (08/09 1135) Pulse Rate:  [56-72] 67 (08/09 1100) Cardiac Rhythm:  [-] Normal sinus rhythm (08/09 1100) Resp:  [5-27] 18 (08/09 1100) BP: (118-148)/(59-77) 133/77 mmHg (08/09 1100) SpO2:  [93 %-99 %] 93 % (08/09 1100)  No results for input(s): GLUCAP in the last 168 hours.  Recent Labs Lab 05/20/15 2145 05/20/15 2354 05/21/15 0855 05/21/15 1440 05/21/15 2050 05/22/15 0316 05/22/15 0840  NA 140 140 138 146* 149* 150* 148*  K 4.5 3.5  --   --   --   --   --   CL 99* 104  --   --   --   --   --   CO2 31  --   --   --   --   --   --   GLUCOSE 102* 111*  --   --   --   --   --   BUN 15 13  --   --   --   --   --   CREATININE 0.57 0.60  --   --   --   --   --   CALCIUM 9.3  --   --   --   --   --   --      Recent Labs Lab 05/20/15 2145  AST 28  ALT 32  ALKPHOS 113  BILITOT 0.4  PROT 6.4*  ALBUMIN 3.7    Recent Labs Lab 05/20/15 2145 05/20/15 2337 05/20/15 2354  WBC 6.0  --   --   NEUTROABS  --  6.4  --   HGB 13.3  --  15.0  HCT 39.9  --  44.0  MCV 88.5  --   --   PLT 238  --   --    No results for input(s): CKTOTAL, CKMB, CKMBINDEX, TROPONINI in the last 168 hours.  Recent Labs  05/20/15 2337  LABPROT 14.1  INR 1.07    Recent Labs  05/20/15 2148  COLORURINE YELLOW  LABSPEC <1.005*  PHURINE 7.0  GLUCOSEU NEGATIVE  HGBUR TRACE*  BILIRUBINUR NEGATIVE  KETONESUR NEGATIVE  PROTEINUR NEGATIVE  UROBILINOGEN 0.2  NITRITE NEGATIVE  LEUKOCYTESUR NEGATIVE    No results found for: CHOL, TRIG, HDL, CHOLHDL, VLDL, LDLCALC No results found for: HGBA1C No results found for: LABOPIA,  COCAINSCRNUR, LABBENZ, AMPHETMU, THCU, LABBARB  No results for input(s): ETH in the last 168 hours.   Dg Chest Portable 1 View 05/21/2015   1. Left IJ line has been retracted, and is now seen ending overlying the proximal SVC. 2. No acute cardiopulmonary process seen.    05/21/2015   1. Left IJ line noted ending overlying the right brachiocephalic vein. This should be retracted at least 5 cm and repositioned, as deemed clinically appropriate. 2. Lungs hypoexpanded but grossly clear.    Ct Head Wo Contrast 05/21/2015    Extensive intraparenchymal hemorrhage which arises from the left parietal lobe and extends into the splenium of the corpus callosum and slightly superior to the left lateral ventricle. There is extensive subarachnoid hemorrhage on the left with a small amount of subarachnoid hemorrhage crossing to the right anteriorly. The ventricles are not efface, and there is no midline shift. There is no extra-axial fluid. Elsewhere gray-white compartments appear normal.    MRI HEAD 05/21/2015    Acute large LEFT parietal intraparenchymal hematoma dissecting along the corpus callosum,  intraventricular extension without obstructive hydrocephalus. Scattered subarachnoid hemorrhage better seen on today's CT head.  Findings suggest postpartum angiopathy ; postpartum angiopathy typically affects the medium and small vessels which are not well characterized on MRA of the head; follow-up CTA of the head or digital subtraction angiography could be performed as clinically indicated . Location is also typical for venous infarction though, there is no major dural venous sinus thrombosis. Additional consideration are vascular malformation or hemorrhagic mass.   MRA HEAD 05/21/2015    No acute vascular process ; normal MRA head.    MRV HEAD 05/21/2015    No dural venous sinus thrombosis ; normal noncontrast MRV head.     EEG Abnormal EEG due to the following: 1)Suppressed background rhythm which is likely related to medication 2)Left sided sharp wave activity consistent with Periodic Lateralized Epileptiform Discharges (PLEDs)   PHYSICAL EXAM Frail young Caucasian lady currently not in distress. She has EEG leads on her head. . Afebrile. Head is nontraumatic. Neck is supple without bruit.    Cardiac exam no murmur or gallop. Lungs are clear to auscultation. Distal pulses are well felt. Neurological Exam :  Patient is awake alert aphasic nonfluent. Unable to name or repeat. Follows only simple one-step commands. Left gaze preference but able to look spontaneously to the right past midline. Does not blink to threat on either side. Fundi were not visualized. Pupils 3 mm equal reactive. Vision acuity cannot be reliably tested. Right lower facial weakness. Tongue midline. Dense right hemiplegia with withdrawal in the right lower extremity to pain and none in the right upper extremity with hypotonia. Purposeful antigravity movements on the left side. Plantars are both downgoing. Gait cannot be tested. ASSESSMENT/PLAN [name] is a 39 y.o. female 2 weeks post partum presenting with severe  headaches for several days who developed right hemiparesis and aphasia with resultant right focal seizure in the ED.    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   Resultant  Headache, lethargy, Rt hemiplegia, partial seizures and abnormal EEG  MRI  Dominant left parietal ICH with cytotoxic cerebral edema, IVH and obstructive hydrocephalus, SAH, cannot rule out underlying mass or vascular malformation  MRA  Unremarkable  MRV Unremarkable   LDL pending   HgbA1c pending  SCDs for VTE prophylaxis Diet NPO time specified  no antithrombotic  prior to admission  Start 3% saline for cytotoxic cerebral edema   Will treat edema and seizure, will not anticoagulate at this time  Therapy recommendations:  pending   Disposition:  pending   Seizure  EEG L PLEDS  On Keppra, loaded with 1000 mg, now on 750 q 12h  Other Stroke Risk Factors  ETOH use  Migraines  2 weeks post partum, lactating  Other Active Problems  Anxiety on prozac  Hospital day # 1 I have personally examined this patient, reviewed notes, independently viewed imaging studies, participated in medical decision making and plan of care. I have made any additions or clarifications directly to the above note.  She has presented with 1 week of intermittent headache followed by speech difficulties and right-sided weakness due to his left frontal parasagittal parenchymal hemorrhage with subarachnoid extension and significant cytotoxic edema and early midline shift. Etiology of the hemorrhage isn't indeterminate but cortical vein thrombosis is a strong possibility even though MRV was unremarkable. The patient had been taking multiple doses of ibuprofen for the past few weeks which perhaps may have also contributing to the hemorrhage The patient's management presents a challenge as anticoagulation would be risky given her large  intracerebral hematoma hence we will treat conservatively with ICP lowering measures with hypertonic saline and induced hypernatremia. Continue Keppra for seizures.Speech  therapy for swallow eval and start diet .check CT venogram tomorrow and if cerebral venous sinus thrombosis is noted in a consider anticoagulation despite risk due to recent hemorrhage otherwise been started on aspirin and Lovenox for DVT prevention . ANA panel and hypercoagulable panel labs. I had a long discussion at the bedside with the patient's husband and mother and explained her prognosis, plan of care and answered questions.  This patient is critically ill and at significant risk of neurological worsening, death and care requires constant monitoring of vital signs, hemodynamics,respiratory and cardiac monitoring, extensive review of multiple databases, frequent neurological assessment, discussion with family, other specialists and medical decision making of high complexity.I have made any additions or clarifications directly to the above note.This critical care time does not reflect procedure time, or teaching time or supervisory time of PA/NP/Med Resident etc but could involve care discussion time.  I spent 35 minutes of neurocritical care time  in the care of  this patient.   Antony Contras, MD Medical Director [facility] Pager: [phone] 05/22/2015 12:15 PM     To contact Stroke Continuity provider, please refer to [email or web address] After hours, contact General Neurology

## 2015-05-22 NOTE — Progress Notes (Signed)
Rehab Admissions Coordinator Note:  Patient was screened by Malaisha Silliman L for appropriateness for an Inpatient Acute Rehab Consult.  At this time, we are recommending Inpatient Rehab consult.  Mira Balon L 05/22/2015, 1:59 PM  I can be reached at [phone].

## 2015-05-22 NOTE — Progress Notes (Signed)
EEG completed, results pending. 

## 2015-05-22 NOTE — Progress Notes (Signed)
OT Cancellation Note  Patient Details Name: Ashley [name] MRN: [id number] DOB: 16-Apr-1976   Cancelled Treatment:    Reason Eval/Treat Not Completed: Medical issues which prohibited therapy - pt vomiting.  Will reattempt.   Darlina Rumpf [place], OTR/L [phone]  05/22/2015, 10:52 AM

## 2015-05-22 NOTE — Consult Note (Signed)
Physical Medicine and Rehabilitation Consult  Reason for Consult:  Right facial droop with aphasia, right sided weakness,  Referring Physician: Dr. Leonie Man.    HPI: Ashley [name] is a 39 y.o. female two weeks post-partum with complaints of HA on and off since delivery. She was admitted via [facility] with severe HA and developed right facial droop with aphasia and right sided weakness after admission. She was transferred to [facility] and developed right focal seizure  en route treated with ativan. MRI/MRA brain done revealing acute large LEFT parietal intraparenchymal hematoma dissecting along the corpus callosum with intraventricular extension, scattered SAH with findings suggestive of postpartum angiopathy, no obstructive hydrocephalus and no venous thrombosis.  EEG showed evidence of left sided  (periodic lateral epileptiform discharges)PLEDs and patient was loaded with Keppra and started on Keppra 750 mg bid. ICP treated with hypertonic saline and induced hypernatremia. Follow up CCT today shows large left fronto-parietal hematoma with progression of vasogenic edema and extensive SAH left hemisphere. Exact etiology of CVA has not been defined. There may be some underlying mass or vascular malformation which may be masked by the large hemorrhage. PT/OT evaluations done today and CIR recommended by MD and rehab team.    Review of Systems  Unable to perform ROS: language      Past Medical History  Diagnosis Date  . Anxiety     Past Surgical History  Procedure Laterality Date  . Dilation and curettage of uterus      Family History  Problem Relation Age of Onset  . Cancer Mother   . Cancer Father     Social History:  Married. Has 36 week old and 2 step children at home. Per reports working and active PTA. reports that she has never smoked. She has never used smokeless tobacco. She reports that she drinks alcohol. She reports that she does not use illicit drugs.    Allergies: No Known  Allergies    Medications Prior to Admission  Medication Sig Dispense Refill  . acetaminophen (TYLENOL) 500 MG tablet Take 1,000 mg by mouth every 6 (six) hours as needed for headache.    Marland Kitchen acyclovir (ZOVIRAX) 400 MG tablet Take 400 mg by mouth 5 (five) times daily as needed (for breakout).     Marland Kitchen docusate sodium (COLACE) 100 MG capsule Take 100 mg by mouth daily.    Marland Kitchen FLUoxetine (PROZAC) 40 MG capsule Take 40 mg by mouth daily.    Marland Kitchen ibuprofen (ADVIL,MOTRIN) 600 MG tablet Take 1 tablet (600 mg total) by mouth every 6 (six) hours as needed for moderate pain or cramping. 40 tablet 0  . Prenatal Vit-Fe Fumarate-FA (PRENATAL MULTIVITAMIN) TABS tablet Take 1 tablet by mouth daily at 12 noon.      Home: Home Living Family/patient expects to be discharged to:: Inpatient rehab Living Arrangements: Spouse/significant other, Children Available Help at Discharge: Family, Available 24 hours/day Type of Home: House Home Access: Stairs to enter [employer] of Steps: 3-4 in front and one via garage Home Layout: Two level, Bed/bath upstairs Alternate Level Stairs-Number of Steps: full flight  Bathroom Toilet: Standard Home Equipment: None Additional Comments: Pt lives with spouse, and 63 week old son.  She shares custody of children ages 35 and 97.  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  She runs frequently  Functional Status:  Mobility: Bed Mobility Overal bed mobility: Needs Assistance Bed Mobility: Supine to Sit Supine to sit: Max assist General bed mobility comments: assist for moving right side to EOB and lifting trunk upright Transfers Overall transfer level: Needs assistance Equipment used: 2 person hand held assist Transfers: Squat Pivot Transfers Squat pivot  transfers: Max assist, +2 physical assistance General transfer comment: increased time, pt able to bear weight for transfer, but needs assist for pivoting hips Ambulation/Gait General Gait Details: not tested due to HA in sitting and pt fearful due to right side weakness    ADL: ADL Overall ADL's : Needs assistance/impaired Eating/Feeding: Maximal assistance, Sitting Grooming: Wash/dry hands, Wash/dry face, Brushing hair, Maximal assistance, Moderate assistance Grooming Details (indicate cue type and reason): Pt able to identify "comb".  but unable to demonstrate use initially.  With verbal cues and gestures, she was able to demonstrate use, but demonstrated signfiicant difficulty positioning comb appropriately in relation to hair/head requiring hand over hand assist to complete task  Upper Body Bathing: Maximal assistance, Sitting Lower Body Bathing: Total assistance, Sit to/from stand Upper Body Dressing : Total assistance, Sitting Lower Body Dressing: Total assistance, Sit to/from stand, Sitting/lateral leans Toilet Transfer: Maximal assistance, +2 for safety/equipment, Squat-pivot, BSC Toileting- Clothing Manipulation and Hygiene: Total assistance, Sitting/lateral lean Functional mobility during ADLs: Maximal assistance, +2 for physical assistance  Cognition: Cognition Overall Cognitive Status: Difficult to assess Orientation Level: Oriented to person, Disoriented to time, Disoriented to place, Disoriented to situation Cognition Arousal/Alertness: Awake/alert Behavior During Therapy: Flat affect Overall Cognitive Status: Difficult to assess Area of Impairment: Following commands Following Commands: Follows one step commands inconsistently General Comments: Pt follows one step commands inconsitently.   She appeared to recognize infant son, and interacted appropriately with him within confines of physical and communication impairments  Difficult to assess due to: Impaired  communication  Blood pressure 133/77, pulse 67, temperature 98.2 F (36.8 C), temperature source Oral, resp. rate 18, height 5\' 3"  (1.6 m), weight 66.6 kg (146 lb 13.2 oz), SpO2 93 %, unknown if currently breastfeeding. Physical Exam  Nursing note and vitals reviewed. Constitutional: She appears well-developed and well-nourished.  HENT:  Head: Normocephalic and atraumatic.  Eyes: Conjunctivae are normal. Pupils are equal, round, and reactive to light.  Neck: Normal range of motion. Neck supple.  Cardiovascular: Normal rate and regular rhythm.   Respiratory: Effort normal and breath sounds normal.  GI: Soft. Bowel sounds are normal. She exhibits no distension. There is no tenderness.  Musculoskeletal: She exhibits no edema.  Neurological: She is alert.  Alert, pleasant and smiling.    Verbal output was limited to yes. Global aphasia with apraxic component. She was unable to pick her name or place with choice of two.  Unable to point to items in room. Right spastic hemiparesis. RLE noted to have extensor tone as well as myoclonus. Right gaze preference.   Skin: Skin is warm and dry.  Motor strength is 0/5 in the right deltoid, biceps, triceps, grip, hip flexor, knee extensor, ankle dorsi flexor and plantar flexor Left side has at least 4/5 strength, has some difficulty with following commands although she does fairly well with simple one step commands Difficult to assess sensation secondary to aphasia. She does withdraw to pinch in the right upper and right lower extremity Results for orders placed or performed during the hospital encounter of 05/20/15 (from the past 24 hour(s))  Sodium  Status: Abnormal   Collection Time: 05/21/15  8:50 PM  Result Value Ref Range   Sodium 149 (H) 135 - 145 mmol/L  Sodium     Status: Abnormal   Collection Time: 05/22/15  3:16 AM  Result Value Ref Range   Sodium 150 (H) 135 - 145 mmol/L  Sodium     Status: Abnormal   Collection Time: 05/22/15  8:40 AM    Result Value Ref Range   Sodium 148 (H) 135 - 145 mmol/L   Ct Head Wo Contrast  05/22/2015   CLINICAL DATA:  39 year old female with intracranial hemorrhage. Follow-up. Subsequent encounter.  EXAM: CT HEAD WITHOUT CONTRAST  TECHNIQUE: Contiguous axial images were obtained from the base of the skull through the vertex without intravenous contrast.  COMPARISON:  05/21/2015 MR and 05/20/2015 CT.  FINDINGS: Large left posterior frontal -parietal lobe hematoma with extension into the corpus callosum has overall size similar to the prior exam. Interval progression of surrounding vasogenic edema. Mass effect upon the lateral ventricles greater on the left with downward compression better appreciated on recent MR which included coronal imaging. Minimal bowing of the septum towards the right.  Extensive subarachnoid hemorrhage greatest left hemisphere most prominent left sylvian fissure and left convexity minimal changed from prior exam.  Tiny amount of deep dependent interventricular blood similar to the MR.  Cause of intracranial hemorrhage is indeterminate.  No CT evidence of large acute thrombotic infarct.  No calvarial abnormality.  Mastoid air cells, middle ear cavities and visualized paranasal sinuses are clear.  IMPRESSION: Large left posterior frontal -parietal lobe hematoma with extension into the corpus callosum has overall size similar to the prior exam. Interval progression of surrounding vasogenic edema. Mass effect upon the lateral ventricles greater on the left with downward compression better appreciated on recent MR which included coronal imaging. Minimal bowing of the septum towards the right.  Extensive subarachnoid hemorrhage greatest left hemisphere most prominent left sylvian fissure and left convexity minimal changed from prior exam.  Tiny amount of deep dependent interventricular blood similar to the MR.  Cause of intracranial hemorrhage is indeterminate.   Electronically Signed   By: Genia Del M.D.   On: 05/22/2015 07:26   Ct Head Wo Contrast  05/21/2015   CLINICAL DATA:  Severe headaches 2 weeks postpartum  EXAM: CT HEAD WITHOUT CONTRAST  TECHNIQUE: Contiguous axial images were obtained from the base of the skull through the vertex without intravenous contrast.  COMPARISON:  None.  FINDINGS: The ventricles are normal in size and configuration. There is a hemorrhage which appears to arise from the left parietal lobe measuring 6.2 x 3.1 cm. This hemorrhage tracks anteriorly and inferiorly throughout the splenium of the corpus callosum involving most of the splenium of the corpus callosum. Hemorrhage also tracks just superior to the left lateral ventricle. There is subarachnoid hemorrhage throughout much of the brain parenchyma in the supratentorial region on the left with a small amount of subarachnoid hemorrhage crossing to the right slightly anterior to the rostrum of the corpus callosum.  There is no subdural or epidural fluid collection. There is no midline shift. Elsewhere gray-white compartments appear normal. The bony calvarium appears intact. The mastoid air cells are clear.  IMPRESSION: Extensive intraparenchymal hemorrhage which arises from the left parietal lobe and extends into the splenium of the corpus callosum and slightly superior to the left lateral ventricle. There is extensive subarachnoid hemorrhage on the left with a small amount of subarachnoid hemorrhage crossing to the  right anteriorly. The ventricles are not efface, and there is no midline shift. There is no extra-axial fluid. Elsewhere gray-white compartments appear normal.  Critical Value/emergent results were called by telephone at the time of interpretation on 05/21/2015 at 12:12 am to Dr. Doy Mince, neurology, who verbally acknowledged these results.   Electronically Signed   By: Lowella Grip III M.D.   On: 05/21/2015 00:12   Mr Jodene Nam Head Wo Contrast  05/21/2015   CLINICAL DATA:  Headache today, worse than typical  migraine. Acute onset RIGHT-sided weakness. Acute RIGHT focal seizure in emergency department. Two weeks postpartum. Evaluate intracranial hemorrhage.  EXAM: MRI HEAD WITHOUT CONTRAST  MRA HEAD WITHOUT CONTRAST  MRV HEAD WITHOUT CONTRAST  TECHNIQUE: Multiplanar, multiecho pulse sequences of the brain and surrounding structures were obtained without and with intravenous contrast. Angiographic images of the head were obtained using MRA and MRV technique without contrast. MIP images provided.  COMPARISON:  CT head May 20, 2015 at 2354 hours  FINDINGS: MRI HEAD FINDINGS  Acute mesial LEFT parietal intraparenchymal hematoma better seen on prior CT, dissecting along the body of the corpus callosum to the RIGHT. Intraventricular extension with layering blood products in the LEFT lateral ventricle. No hydrocephalus. Minimal surrounding T2 bright vasogenic edema, local sulcal effacement without midline shift. Mass effect on LEFT lateral ventricle. No satellite areas of abnormal parenchymal signal. Susceptibility artifact along the interhemispheric fissure, LEFT sylvian fissure in LEFT cerebellar tentorium consistent with acute blood products seen on today's CT. Bilateral hippocampi demonstrate normal size, morphology and signal characteristics.  Ocular globes and orbital contents are unremarkable. Paranasal sinuses and mastoid air cells are well aerated. No abnormal sellar expansion. No cerebellar tonsillar ectopia. No suspicious calvarial bone marrow signal.  MRA HEAD FINDINGS  Anterior circulation: Normal flow related enhancement of the included cervical, petrous, cavernous and supra clinoid internal carotid arteries. Patent anterior communicating artery. Normal flow related enhancement of the anterior and middle cerebral arteries, including more distal segments. RIGHT AC 1 segment is dominant.  No large vessel occlusion, high-grade stenosis, abnormal luminal irregularity, aneurysm.  Posterior circulation: Codominant  vertebral arteries. Basilar artery is patent, with normal flow related enhancement of the main branch vessels. Patent posterior cerebral arteries. Slightly decreased and flow related enhancement of the distal LEFT posterior cerebral artery though, with normal in caliber, likely representing slow flow secondary to local mass effect.  No large vessel occlusion, high-grade stenosis, abnormal luminal irregularity, aneurysm.  MRV HEAD FINDINGS  Normal flow related enhancement within the superior sagittal sinus, torcula of the Herophili, bilateral transverse, sigmoid sinuses and included internal jugular veins. Normal flow related enhancement of the internal cerebral veins.  IMPRESSION: MRI HEAD: Acute large LEFT parietal intraparenchymal hematoma dissecting along the corpus callosum, intraventricular extension without obstructive hydrocephalus. Scattered subarachnoid hemorrhage better seen on today's CT head.  Findings suggest postpartum angiopathy ; postpartum angiopathy typically affects the medium and small vessels which are not well characterized on MRA of the head; follow-up CTA of the head or digital subtraction angiography could be performed as clinically indicated . Location is also typical for venous infarction though, there is no major dural venous sinus thrombosis. Additional consideration are vascular malformation or hemorrhagic mass. Recommend followup MRI of the head with contrast in 2-4 weeks.  MRA HEAD: No acute vascular process ; normal MRA head.  MRV HEAD: No dural venous sinus thrombosis ; normal noncontrast MRV head.   Electronically Signed   By: Elon Alas M.D.   On: 05/21/2015 03:35  Mr Brain Wo Contrast  05/21/2015   CLINICAL DATA:  Headache today, worse than typical migraine. Acute onset RIGHT-sided weakness. Acute RIGHT focal seizure in emergency department. Two weeks postpartum. Evaluate intracranial hemorrhage.  EXAM: MRI HEAD WITHOUT CONTRAST  MRA HEAD WITHOUT CONTRAST  MRV HEAD  WITHOUT CONTRAST  TECHNIQUE: Multiplanar, multiecho pulse sequences of the brain and surrounding structures were obtained without and with intravenous contrast. Angiographic images of the head were obtained using MRA and MRV technique without contrast. MIP images provided.  COMPARISON:  CT head May 20, 2015 at 2354 hours  FINDINGS: MRI HEAD FINDINGS  Acute mesial LEFT parietal intraparenchymal hematoma better seen on prior CT, dissecting along the body of the corpus callosum to the RIGHT. Intraventricular extension with layering blood products in the LEFT lateral ventricle. No hydrocephalus. Minimal surrounding T2 bright vasogenic edema, local sulcal effacement without midline shift. Mass effect on LEFT lateral ventricle. No satellite areas of abnormal parenchymal signal. Susceptibility artifact along the interhemispheric fissure, LEFT sylvian fissure in LEFT cerebellar tentorium consistent with acute blood products seen on today's CT. Bilateral hippocampi demonstrate normal size, morphology and signal characteristics.  Ocular globes and orbital contents are unremarkable. Paranasal sinuses and mastoid air cells are well aerated. No abnormal sellar expansion. No cerebellar tonsillar ectopia. No suspicious calvarial bone marrow signal.  MRA HEAD FINDINGS  Anterior circulation: Normal flow related enhancement of the included cervical, petrous, cavernous and supra clinoid internal carotid arteries. Patent anterior communicating artery. Normal flow related enhancement of the anterior and middle cerebral arteries, including more distal segments. RIGHT AC 1 segment is dominant.  No large vessel occlusion, high-grade stenosis, abnormal luminal irregularity, aneurysm.  Posterior circulation: Codominant vertebral arteries. Basilar artery is patent, with normal flow related enhancement of the main branch vessels. Patent posterior cerebral arteries. Slightly decreased and flow related enhancement of the distal LEFT posterior  cerebral artery though, with normal in caliber, likely representing slow flow secondary to local mass effect.  No large vessel occlusion, high-grade stenosis, abnormal luminal irregularity, aneurysm.  MRV HEAD FINDINGS  Normal flow related enhancement within the superior sagittal sinus, torcula of the Herophili, bilateral transverse, sigmoid sinuses and included internal jugular veins. Normal flow related enhancement of the internal cerebral veins.  IMPRESSION: MRI HEAD: Acute large LEFT parietal intraparenchymal hematoma dissecting along the corpus callosum, intraventricular extension without obstructive hydrocephalus. Scattered subarachnoid hemorrhage better seen on today's CT head.  Findings suggest postpartum angiopathy ; postpartum angiopathy typically affects the medium and small vessels which are not well characterized on MRA of the head; follow-up CTA of the head or digital subtraction angiography could be performed as clinically indicated . Location is also typical for venous infarction though, there is no major dural venous sinus thrombosis. Additional consideration are vascular malformation or hemorrhagic mass. Recommend followup MRI of the head with contrast in 2-4 weeks.  MRA HEAD: No acute vascular process ; normal MRA head.  MRV HEAD: No dural venous sinus thrombosis ; normal noncontrast MRV head.   Electronically Signed   By: Elon Alas M.D.   On: 05/21/2015 03:35   Dg Chest Portable 1 View  05/21/2015   CLINICAL DATA:  Central line repositioning.  Initial encounter.  EXAM: PORTABLE CHEST - 1 VIEW  COMPARISON:  None.  FINDINGS: The patient's left IJ line has been retracted, and is now seen ending overlying the proximal SVC.  The lungs are well-aerated and clear. There is no evidence of focal opacification, pleural effusion or pneumothorax.  The cardiomediastinal  silhouette is borderline normal in size. No acute osseous abnormalities are seen.  IMPRESSION: 1. Left IJ line has been retracted,  and is now seen ending overlying the proximal SVC. 2. No acute cardiopulmonary process seen.   Electronically Signed   By: Garald Balding M.D.   On: 05/21/2015 02:21   Dg Chest Portable 1 View  05/21/2015   CLINICAL DATA:  Left central line placement.  Initial encounter.  EXAM: PORTABLE CHEST - 1 VIEW  COMPARISON:  None.  FINDINGS: The patient's left IJ line is noted extending overlying the right brachiocephalic vein. This should be retracted at least 5 cm and repositioned.  The lungs are hypoexpanded. No pleural effusion or pneumothorax is seen.  The cardiomediastinal silhouette is borderline normal in size. No acute osseous abnormalities are identified.  IMPRESSION: 1. Left IJ line noted ending overlying the right brachiocephalic vein. This should be retracted at least 5 cm and repositioned, as deemed clinically appropriate. 2. Lungs hypoexpanded but grossly clear.  These results were called by telephone at the time of interpretation on 05/21/2015 at 2:15 am to [facility] on [facility], who verbally acknowledged these results.   Electronically Signed   By: Garald Balding M.D.   On: 05/21/2015 02:15   Mr Mrv Head Wo Cm  05/21/2015   CLINICAL DATA:  Headache today, worse than typical migraine. Acute onset RIGHT-sided weakness. Acute RIGHT focal seizure in emergency department. Two weeks postpartum. Evaluate intracranial hemorrhage.  EXAM: MRI HEAD WITHOUT CONTRAST  MRA HEAD WITHOUT CONTRAST  MRV HEAD WITHOUT CONTRAST  TECHNIQUE: Multiplanar, multiecho pulse sequences of the brain and surrounding structures were obtained without and with intravenous contrast. Angiographic images of the head were obtained using MRA and MRV technique without contrast. MIP images provided.  COMPARISON:  CT head May 20, 2015 at 2354 hours  FINDINGS: MRI HEAD FINDINGS  Acute mesial LEFT parietal intraparenchymal hematoma better seen on prior CT, dissecting along the body of the corpus callosum to the RIGHT. Intraventricular extension with  layering blood products in the LEFT lateral ventricle. No hydrocephalus. Minimal surrounding T2 bright vasogenic edema, local sulcal effacement without midline shift. Mass effect on LEFT lateral ventricle. No satellite areas of abnormal parenchymal signal. Susceptibility artifact along the interhemispheric fissure, LEFT sylvian fissure in LEFT cerebellar tentorium consistent with acute blood products seen on today's CT. Bilateral hippocampi demonstrate normal size, morphology and signal characteristics.  Ocular globes and orbital contents are unremarkable. Paranasal sinuses and mastoid air cells are well aerated. No abnormal sellar expansion. No cerebellar tonsillar ectopia. No suspicious calvarial bone marrow signal.  MRA HEAD FINDINGS  Anterior circulation: Normal flow related enhancement of the included cervical, petrous, cavernous and supra clinoid internal carotid arteries. Patent anterior communicating artery. Normal flow related enhancement of the anterior and middle cerebral arteries, including more distal segments. RIGHT AC 1 segment is dominant.  No large vessel occlusion, high-grade stenosis, abnormal luminal irregularity, aneurysm.  Posterior circulation: Codominant vertebral arteries. Basilar artery is patent, with normal flow related enhancement of the main branch vessels. Patent posterior cerebral arteries. Slightly decreased and flow related enhancement of the distal LEFT posterior cerebral artery though, with normal in caliber, likely representing slow flow secondary to local mass effect.  No large vessel occlusion, high-grade stenosis, abnormal luminal irregularity, aneurysm.  MRV HEAD FINDINGS  Normal flow related enhancement within the superior sagittal sinus, torcula of the Herophili, bilateral transverse, sigmoid sinuses and included internal jugular veins. Normal flow related enhancement of the internal cerebral veins.  IMPRESSION: MRI HEAD: Acute large LEFT parietal intraparenchymal hematoma  dissecting along the corpus callosum, intraventricular extension without obstructive hydrocephalus. Scattered subarachnoid hemorrhage better seen on today's CT head.  Findings suggest postpartum angiopathy ; postpartum angiopathy typically affects the medium and small vessels which are not well characterized on MRA of the head; follow-up CTA of the head or digital subtraction angiography could be performed as clinically indicated . Location is also typical for venous infarction though, there is no major dural venous sinus thrombosis. Additional consideration are vascular malformation or hemorrhagic mass. Recommend followup MRI of the head with contrast in 2-4 weeks.  MRA HEAD: No acute vascular process ; normal MRA head.  MRV HEAD: No dural venous sinus thrombosis ; normal noncontrast MRV head.   Electronically Signed   By: Elon Alas M.D.   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 Does the need for close, 24 hr/day medical supervision in concert with the patient's rehab needs make it unreasonable for this patient to be served in a less intensive setting? Yes 2. Co-Morbidities requiring supervision/potential complications:  aphasia, apraxia,Right inattention 3. Due to bladder management, bowel management, safety, skin/wound care, disease management, medication administration, pain management and patient education, does the patient require 24 hr/day rehab nursing? Yes 4. Does the patient require coordinated care of a physician, rehab nurse, PT (11-2 hrs/day, 5 days/week), OT (1-2 hrs/day, 5 days/week) and SLP (.5-1 hrs/day, 5 days/week) to address physical and functional deficits in the context of the above medical diagnosis(es)? Yes Addressing deficits in the following areas: balance, endurance, locomotion, strength, transferring, bowel/bladder control, bathing, dressing, feeding, grooming, toileting and  cognition 5. Can the patient actively participate in an intensive therapy program of at least 3 hrs of therapy per day at least 5 days per week? Yes 6. The potential for patient to make measurable gains while on inpatient rehab is excellent 7. Anticipated functional outcomes upon discharge from inpatient rehab are min assist  with PT, min assist with OT, min assist with SLP. 8. Estimated rehab length of stay to reach the above functional goals is: 20-25 days 9. Does the patient have adequate social supports and living environment to accommodate these discharge functional goals? Yes 10. Anticipated D/C setting: Home 11. Anticipated post D/C treatments: Outpatient therapy 12. Overall Rehab/Functional Prognosis: excellent  RECOMMENDATIONS: This patient's condition is appropriate for continued rehabilitative care in the following setting: CIR Patient has agreed to participate in recommended program. N/A Note that insurance prior authorization may be required for reimbursement for recommended care.  Comment:     05/22/2015

## 2015-05-22 NOTE — Evaluation (Signed)
Physical Therapy Evaluation Patient Details Name: Ashley [name] MRN: [id number] DOB: 06-Oct-1976 Today's Date: 05/22/2015   History of Present Illness  Ashley [name] is an 39 y.o. female who is two weeks post-partum who developed a headache; CT of the brain shows L parietal ICH w/ SAH and midline shift.  Clinical Impression  Patient presents with decreased mobility due to deficits listed in PT problem list.  She will benefit from skilled PT in the acute setting to allow return home with family assist following CIR level rehab stay.  Family not available at time of eval, but likely to need 24 hour assist upon d/c.    Follow Up Recommendations CIR    Equipment Recommendations  Other (comment) (TBA)    Recommendations for Other Services Rehab consult     Precautions / Restrictions Precautions Precautions: Fall Precaution Comments: right extensor tone      Mobility  Bed Mobility Overal bed mobility: Needs Assistance Bed Mobility: Supine to Sit     Supine to sit: Max assist     General bed mobility comments: assist for moving right side to EOB and lifting trunk upright  Transfers Overall transfer level: Needs assistance Equipment used: 2 person hand held assist Transfers: Squat Pivot Transfers     Squat pivot transfers: Max assist;+2 physical assistance     General transfer comment: increased time, pt able to bear weight for transfer, but needs assist for pivoting hips  Ambulation/Gait             General Gait Details: not tested due to HA in sitting and pt fearful due to right side weakness  Stairs            Wheelchair Mobility    Modified Rankin (Stroke Patients Only) Modified Rankin (Stroke Patients Only) Pre-Morbid Rankin Score: No symptoms Modified Rankin: Severe disability     Balance Overall balance assessment: Needs assistance   Sitting balance-Leahy Scale: Poor Sitting balance - Comments: min to mod assist and cues for anterior weight  shift Postural control: Posterior lean   Standing balance-Leahy Scale: Zero                               Pertinent Vitals/Pain Pain Assessment: Faces Faces Pain Scale: Hurts little more Pain Location: headache Pain Descriptors / Indicators: Headache Pain Intervention(s): Monitored during session    Home Living Family/patient expects to be discharged to:: Inpatient rehab Living Arrangements: Spouse/significant other;Children Available Help at Discharge: Family Type of Home: House         Home Equipment: None Additional Comments: unable to get accurate home information due to patient with expressive aphasia no family present    Prior Function Level of Independence: Independent               Hand Dominance        Extremity/Trunk Assessment   Upper Extremity Assessment: RUE deficits/detail RUE Deficits / Details: stiffness noted throuhgout PROM in elbow and shoulder with shoulder anteriorly positioned at rest, no purposeful movement noted         Lower Extremity Assessment: RLE deficits/detail RLE Deficits / Details: excessive PF tone with continuous clonus with stretch to achilles, unable to move past about 30 degrees plantarflexion even with knee flexed, increased tone throughout rest of leg, but less than at ankle, no voluntary movement noted       Communication   Communication: Expressive difficulties  Cognition Arousal/Alertness: Awake/alert Behavior During Therapy:  Flat affect Overall Cognitive Status: Difficult to assess                      General Comments      Exercises        Assessment/Plan    PT Assessment Patient needs continued PT services  PT Diagnosis Abnormality of gait;Hemiplegia dominant side   PT Problem List Decreased strength;Decreased activity tolerance;Decreased balance;Decreased mobility;Decreased safety awareness;Impaired tone;Decreased knowledge of use of DME  PT Treatment Interventions Balance  training;Gait training;DME instruction;Neuromuscular re-education;Functional mobility training;Therapeutic activities;Therapeutic exercise;Patient/family education   PT Goals (Current goals can be found in the Care Plan section) Acute Rehab PT Goals Patient Stated Goal: unable to state PT Goal Formulation: Patient unable to participate in goal setting Time For Goal Achievement: 06/05/15 Potential to Achieve Goals: Good    Frequency Min 4X/week   Barriers to discharge        Co-evaluation               End of Session Equipment Utilized During Treatment: Gait belt Activity Tolerance: Patient limited by pain Patient left: in chair;with call bell/phone within reach;with chair alarm set           Time: 1150-1210 PT Time Calculation (min) (ACUTE ONLY): 20 min   Charges:   PT Evaluation $Initial PT Evaluation Tier I: 1 Procedure     PT G Codes:        WYNN,CYNDI 05/26/15, 1:14 PM Magda Kiel, [place] 2015-05-26

## 2015-05-23 ENCOUNTER — Inpatient Hospital Stay (HOSPITAL_COMMUNITY): Payer: BLUE CROSS/BLUE SHIELD

## 2015-05-23 DIAGNOSIS — G8101 Flaccid hemiplegia affecting right dominant side: Secondary | ICD-10-CM | POA: Diagnosis present

## 2015-05-23 LAB — SODIUM
SODIUM: 150 mmol/L — AB (ref 135–145)
SODIUM: 156 mmol/L — AB (ref 135–145)
Sodium: 152 mmol/L — ABNORMAL HIGH (ref 135–145)
Sodium: 154 mmol/L — ABNORMAL HIGH (ref 135–145)

## 2015-05-23 LAB — HOMOCYSTEINE: HOMOCYSTEINE-NORM: 5.2 umol/L (ref 0.0–15.0)

## 2015-05-23 LAB — ANTINUCLEAR ANTIBODIES, IFA: ANTINUCLEAR ANTIBODIES, IFA: NEGATIVE

## 2015-05-23 MED ORDER — ENOXAPARIN SODIUM 40 MG/0.4ML ~~LOC~~ SOLN
40.0000 mg | Freq: Every day | SUBCUTANEOUS | Status: DC
  Administered 2015-05-23 – 2015-05-26 (×4): 40 mg via SUBCUTANEOUS
  Filled 2015-05-23 (×5): qty 0.4

## 2015-05-23 MED ORDER — IOHEXOL 350 MG/ML SOLN
75.0000 mL | Freq: Once | INTRAVENOUS | Status: AC | PRN
  Administered 2015-05-23: 75 mL via INTRAVENOUS

## 2015-05-23 NOTE — Progress Notes (Signed)
Speech Language Pathology Treatment: Dysphagia  Patient Details Name: Ashley [name] MRN: [id number] DOB: 07-16-1976 Today's Date: 05/23/2015 Time: 4401-0272 SLP Time Calculation (min) (ACUTE ONLY): 16 min  Assessment / Plan / Recommendation Clinical Impression  F/u after yesterday's swallow assessment: pt demonstrating increased sensory loss right oral cavity today, with notable oral residue and anterior spilling right side. Limited awareness.  Coughs intermittently after consumption of thin liquids.  Toleration of nectar-thick liquids and mechanical soft solids is sufficient.  Mod cues overall needed for safety. Recommend initiating dysphagia 3 diet, nectars for now; meds whole in puree.  Full supervision for safety and attention to right side. Likely will advance quickly to thin liquids.  D/W RN.    HPI Other Pertinent Information: 39 y.o. female who is two weeks post-partum with headache.  ED 05/20/2015 - right weakness, aphasia.  CT of the brain shows L parietal ICH w/ SAH and midline shift.    Pertinent Vitals Pain Assessment: No/denies pain  SLP Plan  Continue with current plan of care    Recommendations Diet recommendations: Dysphagia 3 (mechanical soft);Nectar-thick liquid Liquids provided via: Cup Medication Administration: Whole meds with puree Supervision: Staff to assist with self feeding Compensations: Slow rate;Small sips/bites;Check for pocketing Postural Changes and/or Swallow Maneuvers: Seated upright 90 degrees              Oral Care Recommendations: Oral care BID Follow up Recommendations: Inpatient Rehab Plan: Continue with current plan of care    Ashley [name], [place] CCC/SLP Pager [phone]      Ashley [name] 05/23/2015, 3:28 PM

## 2015-05-23 NOTE — Progress Notes (Signed)
Inpatient Rehabilitation  I briefly visited pt. and left a rehab booklet in her room. Pt. aphasic with unreliable responses, and unable to fully engage in DC planning conversation.   I phoned Katiya Fike, spouse,  to discuss pt's likely need for post acute rehab and potential venues . Remo Lipps appears very supportive and says he and the family will do anything necessary to help his wife.   I began the conversation of pt's need for post acute rehab and reviewed Dr. Letta Pate' recommendations of IP Rehab with him.  He is strongly in support of pt receiving her rehab on CIR.  I explained that we would initiate insurance authorization when appropriate, as pt. still on 3% saline currently.  He has our contact information and I encouraged him to phone if he has questions.  We will follow up tomorrow and monitor for medical progress and readiness for CIR.  Please call if questions.  Karene Fry will be covering for me tomorrow in my absence and can be reached a [phone].   [facility] Admissions Coordinator Cell [phone] Office [phone]

## 2015-05-23 NOTE — Progress Notes (Signed)
STROKE TEAM PROGRESS NOTE   HISTORY Ashley [name] is an 39 y.o. female who is two weeks post-partum who developed a headache today (05/20/2015 at 1600) that she felt was one of her migraines. The pain became so severe that she called the OB on call. OTC medications were tired with no relief and the patient presented to [facility] for evaluation. While being evaluated the patient developed right sided weakness. She eventually progressed to the point that she was also unable to communicate effectively. Code stroke was called and the patient was transferred to [facility]. On arrival patient with right focal seizure. Patient has been having headaches for the past couple of days. CT of the brain shows L parietal ICH w/ SAH and midline shift. She was admitted to the neuro ICU for further evaluation and treatment.   SUBJECTIVE (INTERVAL HISTORY) Her  Mother and- husband at bedside.  She is more alert today. She has not had any further seizures. She remains aphasic with dense right hemiplegia. Repeat CT Venogram scan of the head shows stable appearance of the hemorrhage  andcytotoxic edema but no definite evidence of cerebral venous sinus thrombosis or thrombosed cortical vein. Repeat EEG yesterday shows mild generalized and focal left-sided slowing but no PLEDs or definite seizure activity  OBJECTIVE Temp:  [97.9 F (36.6 C)-100 F (37.8 C)] 100 F (37.8 C) (08/10 0800) Pulse Rate:  [54-67] 60 (08/10 1000) Cardiac Rhythm:  [-] Normal sinus rhythm (08/10 0800) Resp:  [16-27] 20 (08/10 1000) BP: (125-145)/(65-77) 131/75 mmHg (08/10 1000) SpO2:  [93 %-96 %] 96 % (08/10 1000)  No results for input(s): GLUCAP in the last 168 hours.  Recent Labs Lab 05/20/15 2145 05/20/15 2354  05/22/15 0840 05/22/15 1440 05/22/15 2032 05/23/15 0258 05/23/15 0855  NA 140 140  < > 148* 147* 147* 150* 152*  K 4.5 3.5  --   --   --   --   --   --   CL 99* 104  --   --   --   --   --   --   CO2 31  --   --   --   --   --    --   --   GLUCOSE 102* 111*  --   --   --   --   --   --   BUN 15 13  --   --   --   --   --   --   CREATININE 0.57 0.60  --   --   --   --   --   --   CALCIUM 9.3  --   --   --   --   --   --   --   < > = values in this interval not displayed.  Recent Labs Lab 05/20/15 2145  AST 28  ALT 32  ALKPHOS 113  BILITOT 0.4  PROT 6.4*  ALBUMIN 3.7    Recent Labs Lab 05/20/15 2145 05/20/15 2337 05/20/15 2354  WBC 6.0  --   --   NEUTROABS  --  6.4  --   HGB 13.3  --  15.0  HCT 39.9  --  44.0  MCV 88.5  --   --   PLT 238  --   --    No results for input(s): CKTOTAL, CKMB, CKMBINDEX, TROPONINI in the last 168 hours.  Recent Labs  05/20/15 2337  LABPROT 14.1  INR 1.07    Recent Labs  05/20/15 2148  COLORURINE YELLOW  LABSPEC <1.005*  PHURINE 7.0  GLUCOSEU NEGATIVE  HGBUR TRACE*  BILIRUBINUR NEGATIVE  KETONESUR NEGATIVE  PROTEINUR NEGATIVE  UROBILINOGEN 0.2  NITRITE NEGATIVE  LEUKOCYTESUR NEGATIVE    No results found for: CHOL, TRIG, HDL, CHOLHDL, VLDL, LDLCALC No results found for: HGBA1C No results found for: LABOPIA, COCAINSCRNUR, LABBENZ, AMPHETMU, THCU, LABBARB  No results for input(s): ETH in the last 168 hours.   Dg Chest Portable 1 View 05/21/2015   1. Left IJ line has been retracted, and is now seen ending overlying the proximal SVC. 2. No acute cardiopulmonary process seen.    05/21/2015   1. Left IJ line noted ending overlying the right brachiocephalic vein. This should be retracted at least 5 cm and repositioned, as deemed clinically appropriate. 2. Lungs hypoexpanded but grossly clear.    Ct Head Wo Contrast 05/21/2015    Extensive intraparenchymal hemorrhage which arises from the left parietal lobe and extends into the splenium of the corpus callosum and slightly superior to the left lateral ventricle. There is extensive subarachnoid hemorrhage on the left with a small amount of subarachnoid hemorrhage crossing to the right anteriorly. The ventricles are  not efface, and there is no midline shift. There is no extra-axial fluid. Elsewhere gray-white compartments appear normal.    MRI HEAD 05/21/2015    Acute large LEFT parietal intraparenchymal hematoma dissecting along the corpus callosum, intraventricular extension without obstructive hydrocephalus. Scattered subarachnoid hemorrhage better seen on today's CT head.  Findings suggest postpartum angiopathy ; postpartum angiopathy typically affects the medium and small vessels which are not well characterized on MRA of the head; follow-up CTA of the head or digital subtraction angiography could be performed as clinically indicated . Location is also typical for venous infarction though, there is no major dural venous sinus thrombosis. Additional consideration are vascular malformation or hemorrhagic mass.   MRA HEAD 05/21/2015    No acute vascular process ; normal MRA head.    MRV HEAD 05/21/2015    No dural venous sinus thrombosis ; normal noncontrast MRV head.     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 No evidence of vascular malformation identified. No cortical vein thrombosis identified. Recommend repeat imaging (MRI Head without and with contrast may be the most beneficial) once the parenchymal hematoma resolves.  PHYSICAL EXAM Frail young Caucasian lady currently not in distress. She has EEG leads on her head. . Afebrile. Head is nontraumatic. Neck is supple without bruit.    Cardiac exam no murmur or gallop. Lungs are clear to auscultation. Distal pulses are well felt. Neurological Exam :  Patient is awake alert aphasic nonfluent. Unable to name or repeat. Follows only simple one-step commands. Left gaze preference but able to look spontaneously to the right past  midline. Does not blink to threat on either side. Fundi were not visualized. Pupils 3 mm equal reactive. Vision acuity cannot be reliably tested. Right lower facial weakness. Tongue midline. Dense right hemiplegia with withdrawal in the right lower extremity to pain and none in the right upper extremity with hypotonia. Purposeful antigravity movements on the left side. Plantars are both downgoing. Gait cannot be tested. ASSESSMENT/PLAN Ms. Daleisa Halperin is a 39 y.o. female 2 weeks post partum presenting with severe headaches for several days who developed right hemiparesis and aphasia  with resultant right focal seizure in the ED.    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   Resultant  Headache, lethargy, Rt hemiplegia, partial seizures and abnormal EEG  MRI  Dominant left parietal ICH with cytotoxic cerebral edema, IVH and obstructive hydrocephalus, SAH, cannot rule out underlying mass or vascular malformation  MRA  Unremarkable  MRV Unremarkable   LDL pending   HgbA1c pending  Lovenox for VTE prophylaxis Diet NPO time specified  no antithrombotic prior to admission  Start 3% saline for cytotoxic cerebral edema   Will treat edema and seizure, will not anticoagulate at this time  Therapy recommendations:  pending   Disposition:  pending   Seizure  EEG L PLEDS  On Keppra, loaded with 1000 mg, now on 750 q 12h  Other Stroke Risk Factors  ETOH use  Migraines  2 weeks post partum, lactating  Other Active Problems  Anxiety on prozac  Hospital day # 2 I have personally examined this patient, reviewed notes, independently viewed imaging studies, participated in medical decision making and plan of care. I have made any additions or clarifications directly to the above note.  She has presented with 1 week of intermittent headache followed by speech difficulties and right-sided  weakness due to his left frontal parasagittal parenchymal hemorrhage with subarachnoid extension and significant cytotoxic edema and early midline shift. Etiology of the hemorrhage isn't indeterminate but cortical vein thrombosis is a strong possibility even though MRV was unremarkable. The patient had been taking multiple doses of ibuprofen for the past few weeks which perhaps may have also contributing to the hemorrhage The patient's management presents a challenge as anticoagulation would be risky given her large intracerebral hematoma hence we will treat conservatively with ICP lowering measures with hypertonic saline and induced hypernatremia. Continue Keppra for seizures.Speech  therapy for swallow eval and start diet .check  in the absence of definite cerebral venous sinus thrombosis or cortical vein thrombosis on MRV and CT venogram I do not feel strongly that she needs anticoagulation. We will start Lovenox for DVT prophylaxis today and start aspirin 81 mg 1 week after her brain hemorrhage  I had a long discussion regarding her radiology measures with Dr. Lars Pinks neuroradiologist I had a long discussion at the bedside with the patient's husband and mother and explained her prognosis, plan of care and answered questions.  This patient is critically ill and at significant risk of neurological worsening, death and care requires constant monitoring of vital signs, hemodynamics,respiratory and cardiac monitoring, extensive review of multiple databases, frequent neurological assessment, discussion with family, other specialists and medical decision making of high complexity.I have made any additions or clarifications directly to the above note.This critical care time does not reflect procedure time, or teaching time or supervisory time of PA/NP/Med Resident etc but could involve care discussion time.  I spent 35 minutes of neurocritical care time  in the care of  this patient.   Antony Contras, MD Medical  Director [facility] Pager: [phone] 05/23/2015 10:56 AM     To contact Stroke Continuity provider, please refer to [email or web address] After hours, contact General Neurology

## 2015-05-23 NOTE — Evaluation (Signed)
Speech Language Pathology Evaluation Patient Details Name: Ashley [name] MRN: [id number] DOB: 1976-02-15 Today's Date: 05/23/2015 Time: 2706-2376 SLP Time Calculation (min) (ACUTE ONLY): 45 min  Problem List:  Patient Active Problem List   Diagnosis Date Noted  . Right flaccid hemiparesis 05/23/2015  . ICH (intracerebral hemorrhage)   . Seizure disorder, nonconvulsive, with status epilepticus   . Cerebral venous thrombosis of cortical vein   . Cytotoxic cerebral edema   . IVH (intraventricular hemorrhage)   . Term pregnancy 05/07/2015  . Spontaneous vaginal delivery 05/07/2015   Past Medical History:  Past Medical History  Diagnosis Date  . Anxiety    Past Surgical History:  Past Surgical History  Procedure Laterality Date  . Dilation and curettage of uterus     HPI:  39 y.o. female who is two weeks post-partum with headache.  ED 05/20/2015 - right weakness, aphasia.  CT of the brain shows L parietal ICH w/ SAH and midline shift.    Assessment / Plan / Recommendation Clinical Impression  Ashley [name] presents with a nonfluent, expressive>receptive aphasia marked by significant recurrent perseverations, impaired yes/no reliability, improving ability to follow one-step commands.  Ashley [name] demonstrates emerging ability to repeat single words as well as some ability to inhibit perseveratory responses.  Ashley [name]'s ability to read single words aloud reached 90% accuracy during assessment - this modality will be useful in enhancing communication with family/staff.  Recommend initiating aphasia therapy while in acute care; agree with recs for CIR for rehab.      SLP Assessment  Patient needs continued Speech Lanaguage Pathology Services    Follow Up Recommendations  Inpatient Rehab    Frequency and Duration min 3x week  1 week   Pertinent Vitals/Pain Pain Assessment: No/denies pain   SLP Goals  Potential to Achieve Goals (ACUTE ONLY): Good  SLP Evaluation Prior Functioning  Cognitive/Linguistic  Baseline: Within functional limits Type of Home: House  Lives With: Family Available Help at Discharge: Family;Available 24 hours/day   Cognition  Overall Cognitive Status: Impaired/Different from baseline Arousal/Alertness: Awake/alert Orientation Level: Oriented to person;Oriented to place;Disoriented to time;Disoriented to situation    Comprehension  Auditory Comprehension Overall Auditory Comprehension: Impaired Yes/No Questions: Impaired Basic Biographical Questions: 26-50% accurate Basic Immediate Environment Questions: 25-49% accurate Commands: Impaired One Step Basic Commands: 75-100% accurate Conversation: Simple EffectiveTechniques: Extra processing time Visual Recognition/Discrimination Discrimination: Exceptions to [facility] Black/White Line Drawings:  (subportion BDAE - when cued to correct category, able to id pictures within subgroup) Reading Comprehension Reading Status:  (read 9/10 single words aloud)    Expression Expression Primary Mode of Expression: Verbal Verbal Expression Overall Verbal Expression: Impaired Initiation: No impairment Automatic Speech: Name Level of Generative/Spontaneous Verbalization: Phrase Repetition: Impaired Level of Impairment: Word level Naming: Impairment Responsive: 0-25% accurate Confrontation: Impaired Black/White Line Drawings:  Convergent: Not tested Divergent: Not tested Verbal Errors: Perseveration Interfering Components: Attention Written Expression Dominant Hand: Right Written Expression: Not tested   Oral / Motor Oral Motor/Sensory Function Overall Oral Motor/Sensory Function:  (mild right CN VII) Motor Speech Overall Motor Speech: Appears within functional limits for tasks assessed   GO     [name] 05/23/2015, 2:33 PM  Sueanne Maniaci L. Tivis Ringer, [place] CCC/SLP Pager [phone]

## 2015-05-23 NOTE — Progress Notes (Signed)
Pt noticeably more dysarthric within the past few hours.  Made Dr. Leonie Man aware of this. Pt still otherwise neurologically the same. Will continue to monitor pt.

## 2015-05-23 NOTE — Progress Notes (Signed)
[phone] paged neurology with Na+ lab result of 156. Orders given to stop 3% saline and continue to check Na+ levels q 6.

## 2015-05-23 NOTE — Progress Notes (Signed)
Physical Therapy Treatment Patient Details Name: Ashley [name] MRN: [id number] DOB: [birth year] Today's Date: 05/23/2015    History of Present Illness Ashley [name] is an 39 y.o. female who is two weeks post-partum who developed a headache; CT of the brain shows L parietal ICH w/ SAH and midline shift.    PT Comments    Patient progressing with standing activities and activation of right LE in sitting after weight bearing.  Patient also vocalizing more appropriately.  Continue to recommend CIR level therapies at d/c.  Follow Up Recommendations  CIR     Equipment Recommendations  Other (comment) (TBA)    Recommendations for Other Services       Precautions / Restrictions Precautions Precautions: Fall Precaution Comments: right extensor tone    Mobility  Bed Mobility Overal bed mobility: Needs Assistance Bed Mobility: Supine to Sit     Supine to sit: Mod assist     General bed mobility comments: rolling to left with assist for right LE and assist to llift trunk upright  Transfers Overall transfer level: Needs assistance Equipment used: 2 person hand held assist Transfers: Sit to/from Stand;Stand Pivot Transfers Sit to Stand: Mod assist;+2 physical assistance;Max assist Stand pivot transfers: Mod assist;+2 physical assistance       General transfer comment: standing with assist for right LE blocking knee and hip and assisting with upper trunk extension at axilla; assist for moving right leg with pivotal steps to chair.  Ambulation/Gait                 Stairs            Wheelchair Mobility    Modified Rankin (Stroke Patients Only)       Balance       Sitting balance - Comments: min A to supervision cues for anterior weight shift sitting about 4 minutes while getting hair combed   Standing balance support: Bilateral upper extremity supported Standing balance-Leahy Scale: Poor Standing balance comment: stood about 45 sec with mod assist  bilateral UE support and blocking right knee and hip cues for static standing due to moving left LE forward.                    Cognition Arousal/Alertness: Awake/alert Behavior During Therapy: Flat affect Overall Cognitive Status: Difficult to assess Area of Impairment: Attention;Following commands   Current Attention Level: Sustained   Following Commands: Follows one step commands with increased time            Exercises General Exercises - Lower Extremity Long Arc Quad: AAROM;Right;5 reps;Seated    General Comments        Pertinent Vitals/Pain Pain Assessment: No/denies pain    Home Living                      Prior Function            PT Goals (current goals can now be found in the care plan section) Progress towards PT goals: Progressing toward goals    Frequency  Min 4X/week    PT Plan Current plan remains appropriate    Co-evaluation             End of Session Equipment Utilized During Treatment: Gait belt Activity Tolerance: Patient tolerated treatment well Patient left: in chair;with chair alarm set     Time: 7829-5621 PT Time Calculation (min) (ACUTE ONLY): 25 min  Charges:  $Therapeutic Activity: 23-37 mins  G Codes:      WYNN,CYNDI 05/23/2015, 12:38 PM  Magda Kiel, [place] 05/23/2015

## 2015-05-23 NOTE — Progress Notes (Signed)
Patient ID: Ashley [name], female   DOB: October 07, 1976, 39 y.o.   MRN: [id number] Patient doing well without complaints minimal headache.  Awake alert right hemiparesis versus hemiplegia  Significant clinical improvement. No neurosurgical recommendations, reconsult as needed.

## 2015-05-24 DIAGNOSIS — G8100 Flaccid hemiplegia affecting unspecified side: Secondary | ICD-10-CM

## 2015-05-24 LAB — PROTEIN C, TOTAL: PROTEIN C, TOTAL: 77 % (ref 70–140)

## 2015-05-24 LAB — CARDIOLIPIN ANTIBODIES, IGG, IGM, IGA
Anticardiolipin IgA: <9 APL U/mL (ref 0–11)
Anticardiolipin IgG: <9 GPL U/mL (ref 0–14)
Anticardiolipin IgM: <9 MPL U/mL (ref 0–12)

## 2015-05-24 LAB — PROTEIN S, TOTAL: Protein S Ag, Total: 122 % (ref 58–150)

## 2015-05-24 LAB — SODIUM: Sodium: 155 mmol/L — ABNORMAL HIGH (ref 135–145)

## 2015-05-24 LAB — PROTEIN S ACTIVITY: PROTEIN S ACTIVITY: 99 % (ref 60–145)

## 2015-05-24 LAB — PROTEIN C ACTIVITY: PROTEIN C ACTIVITY: 120 % (ref 74–151)

## 2015-05-24 MED ORDER — ACETAMINOPHEN 500 MG PO TABS: 1000.0000 mg | ORAL_TABLET | Freq: Four times a day (QID) | ORAL | Status: DC | PRN

## 2015-05-24 MED ORDER — PRENATAL MULTIVITAMIN CH
1.0000 | ORAL_TABLET | Freq: Every day | ORAL | Status: DC
  Administered 2015-05-25 – 2015-05-26 (×2): 1 via ORAL
  Filled 2015-05-24 (×2): qty 1

## 2015-05-24 NOTE — Progress Notes (Signed)
Patient is transferred to room 4N26 at this time from unit 50M. Alert and in stable condition. Husband at bedside.

## 2015-05-24 NOTE — Progress Notes (Signed)
Rehab admissions - I spoke with RN and patient is off 3% NS now.  I will open the case with [employer] anticipating potential admission to acute inpatient rehab.  I will update all once I hear back from insurance carrier.  Call me for questions.  #[phone]

## 2015-05-24 NOTE — Progress Notes (Signed)
Speech Language Pathology Treatment: Dysphagia;Cognitive-Linquistic  Patient Details Name: Ashley [name] MRN: [id number] DOB: February 02, 1976 Today's Date: 05/24/2015 Time: 5284-1324 SLP Time Calculation (min) (ACUTE ONLY): 26 min  Assessment / Plan / Recommendation Clinical Impression  Patient seen for dysphagia f/u after initial bedside 8/9. Patient without overt indication of aspiration with dysphagia 3 solids and nectar thick liquids with SLP providing moderate verbal, visual, and tactile cueing for small single sips, awareness of right anterior labial spillage (which appeared decreased from 810), and awareness and clearance of mild right sided buccal pocketing. Trials of thin liquids provided with immediate cough response, suspect sensory impairment resulting in delayed swallow initiation, in 20% of trials. Overall swallowing function improving. Suspect continued rapid improvement with potential upgrade of liquids at bedside over next 24-48 hours. Family educated regarding need for continued thickened liquids.   Aphasia treatment focused on utilization of compensatory strategies. Written cueing provided for facilitation of verbal output at the single word level with minimal improvement in ability to produce appropriate, intelligible verbalizations (25% accuracy). Patient remains severely perseverative, requiring max assist to inhibit today, with redirection to different task. Recommend continued f/u for aphasia as well as differential diagnosis of cognitive deficits.    HPI Other Pertinent Information: 39 y.o. female who is two weeks post-partum with headache.  ED 05/20/2015 - right weakness, aphasia.  CT of the brain shows L parietal ICH w/ SAH and midline shift.    Pertinent Vitals Pain Assessment: Faces Faces Pain Scale: No hurt  SLP Plan  Continue with current plan of care    Recommendations Diet recommendations: Dysphagia 3 (mechanical soft);Nectar-thick liquid Liquids provided via: Cup;No  straw Medication Administration: Whole meds with puree Supervision: Staff to assist with self feeding Compensations: Slow rate;Small sips/bites;Check for pocketing Postural Changes and/or Swallow Maneuvers: Seated upright 90 degrees             Oral Care Recommendations: Oral care BID Follow up Recommendations: Inpatient Rehab Plan: Continue with current plan of care    [place] Car[place], [place] [phone]   [name] 05/24/2015, 9:47 AM

## 2015-05-24 NOTE — Progress Notes (Signed)
STROKE TEAM PROGRESS NOTE   HISTORY Ashley [name] is an 39 y.o. female who is two weeks post-partum who developed a headache today (05/20/2015 at 1600) that she felt was one of her migraines. The pain became so severe that she called the OB on call. OTC medications were tired with no relief and the patient presented to [facility] for evaluation. While being evaluated the patient developed right sided weakness. She eventually progressed to the point that she was also unable to communicate effectively. Code stroke was called and the patient was transferred to [facility]. On arrival patient with right focal seizure. Patient has been having headaches for the past couple of days. CT of the brain shows L parietal ICH w/ SAH and midline shift. She was admitted to the neuro ICU for further evaluation and treatment.   SUBJECTIVE (INTERVAL HISTORY) Her  Mother and- husband at bedside.  She remains alert but aphasic. She has not had any further seizures. She remains aphasic with dense right hemiplegia. Hypertonic saline drip on hold as sodium above goal OBJECTIVE Temp:  [98.7 F (37.1 C)-100.4 F (38 C)] 99 F (37.2 C) (08/11 0745) Pulse Rate:  [51-69] 61 (08/11 0700) Cardiac Rhythm:  [-] Sinus bradycardia (08/11 0600) Resp:  [9-24] 9 (08/11 0700) BP: (113-150)/(56-104) 121/65 mmHg (08/11 0700) SpO2:  [94 %-99 %] 96 % (08/11 0700)  No results for input(s): GLUCAP in the last 168 hours.  Recent Labs Lab 05/20/15 2145 05/20/15 2354  05/23/15 0258 05/23/15 0855 05/23/15 1448 05/23/15 2115 05/24/15 0317  NA 140 140  < > 150* 152* 154* 156* 155*  K 4.5 3.5  --   --   --   --   --   --   CL 99* 104  --   --   --   --   --   --   CO2 31  --   --   --   --   --   --   --   GLUCOSE 102* 111*  --   --   --   --   --   --   BUN 15 13  --   --   --   --   --   --   CREATININE 0.57 0.60  --   --   --   --   --   --   CALCIUM 9.3  --   --   --   --   --   --   --   < > = values in this interval not  displayed.  Recent Labs Lab 05/20/15 2145  AST 28  ALT 32  ALKPHOS 113  BILITOT 0.4  PROT 6.4*  ALBUMIN 3.7    Recent Labs Lab 05/20/15 2145 05/20/15 2337 05/20/15 2354  WBC 6.0  --   --   NEUTROABS  --  6.4  --   HGB 13.3  --  15.0  HCT 39.9  --  44.0  MCV 88.5  --   --   PLT 238  --   --    No results for input(s): CKTOTAL, CKMB, CKMBINDEX, TROPONINI in the last 168 hours. No results for input(s): LABPROT, INR in the last 72 hours. No results for input(s): COLORURINE, LABSPEC, [place], GLUCOSEU, HGBUR, BILIRUBINUR, KETONESUR, PROTEINUR, UROBILINOGEN, NITRITE, LEUKOCYTESUR in the last 72 hours.  Invalid input(s): APPERANCEUR  No results found for: CHOL, TRIG, HDL, CHOLHDL, VLDL, LDLCALC No results found for: HGBA1C No results found for: LABOPIA,  COCAINSCRNUR, LABBENZ, AMPHETMU, THCU, LABBARB  No results for input(s): ETH in the last 168 hours.   Dg Chest Portable 1 View 05/21/2015   1. Left IJ line has been retracted, and is now seen ending overlying the proximal SVC. 2. No acute cardiopulmonary process seen.    05/21/2015   1. Left IJ line noted ending overlying the right brachiocephalic vein. This should be retracted at least 5 cm and repositioned, as deemed clinically appropriate. 2. Lungs hypoexpanded but grossly clear.    Ct Head Wo Contrast 05/21/2015    Extensive intraparenchymal hemorrhage which arises from the left parietal lobe and extends into the splenium of the corpus callosum and slightly superior to the left lateral ventricle. There is extensive subarachnoid hemorrhage on the left with a small amount of subarachnoid hemorrhage crossing to the right anteriorly. The ventricles are not efface, and there is no midline shift. There is no extra-axial fluid. Elsewhere gray-white compartments appear normal.    MRI HEAD 05/21/2015    Acute large LEFT parietal intraparenchymal hematoma dissecting along the corpus callosum, intraventricular extension without obstructive  hydrocephalus. Scattered subarachnoid hemorrhage better seen on today's CT head.  Findings suggest postpartum angiopathy ; postpartum angiopathy typically affects the medium and small vessels which are not well characterized on MRA of the head; follow-up CTA of the head or digital subtraction angiography could be performed as clinically indicated . Location is also typical for venous infarction though, there is no major dural venous sinus thrombosis. Additional consideration are vascular malformation or hemorrhagic mass.   MRA HEAD 05/21/2015    No acute vascular process ; normal MRA head.    MRV HEAD 05/21/2015    No dural venous sinus thrombosis ; normal noncontrast MRV head.     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 No evidence of vascular malformation identified. No cortical vein thrombosis identified. Recommend repeat imaging (MRI Head without and with contrast may be the most beneficial) once the parenchymal hematoma resolves.  PHYSICAL EXAM Frail young Caucasian lady currently not in distress. She has EEG leads on her head. . Afebrile. Head is nontraumatic. Neck is supple without bruit.    Cardiac exam no murmur or gallop. Lungs are clear to auscultation. Distal pulses are well felt. Neurological Exam :  Patient is awake alert aphasic nonfluent. Speaks intermittent sentences. Verbal perseveration.Unable to name or repeat. Follows only simple one-step commands. Left gaze preference but able to look spontaneously to the right past midline. Does not blink to threat on either side. Fundi were not visualized. Pupils 3 mm equal reactive. Vision acuity cannot be reliably tested. Right lower facial weakness. Tongue midline. Dense right hemiplegia with withdrawal in the right  lower extremity to pain and none in the right upper extremity with hypotonia. Purposeful antigravity movements on the left side. Plantars are both downgoing. Gait cannot be tested. ASSESSMENT/PLAN Ashley [name] is a 39 y.o. female 2 weeks post partum presenting with severe headaches for several days who developed right hemiparesis and aphasia with resultant right focal seizure in the ED.    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   Resultant  Headache, lethargy, Rt hemiplegia, partial seizures and abnormal EEG  MRI  Dominant left parietal ICH with cytotoxic  cerebral edema, IVH and obstructive hydrocephalus, SAH, cannot rule out underlying mass or vascular malformation  MRA  Unremarkable  MRV Unremarkable   LDL pending   HgbA1c pending  Lovenox for VTE prophylaxis DIET DYS 3 Room service appropriate?: Yes; Fluid consistency:: Nectar Thick  no antithrombotic prior to admission  DC 3% saline for cytotoxic cerebral edema 05/24/15  Will treat edema and seizure, will not anticoagulate at this time  Therapy recommendations:  CLR   Disposition:  pending   Seizure  EEG L PLEDS  On Keppra, loaded with 1000 mg, now on 750 q 12h Induced Hypernatremia ; on 3% saline. Sodium at goal. 3% saline on hold but will Dc today Other Stroke Risk Factors  ETOH use  Migraines  2 weeks post partum, lactating  Other Active Problems  Anxiety on prozac  Hospital day # 3 I have personally examined this patient, reviewed notes, independently viewed imaging studies, participated in medical decision making and plan of care. I have made any additions or clarifications directly to the above note.  She has presented with 1 week of intermittent headache followed by speech difficulties and right-sided weakness due to his left frontal parasagittal parenchymal hemorrhage with subarachnoid extension and  significant cytotoxic edema and early midline shift. Etiology of the hemorrhage is  indeterminate but cortical vein thrombosis is a strong possibility even though MRV and CT venogram was unremarkable.She will need catheter angiogram in 2-3 months. The patient had been taking multiple doses of ibuprofen for the past few weeks which perhaps may have also contributing to the hemorrhage The patient's management presents a challenge as anticoagulation would be risky given her large intracerebral hematoma hence we will treat conservatively with ICP lowering measures with hypertonic saline and induced hypernatremia. Continue Keppra for seizures.Speech  therapy for swallow eval and start diet .check  in the absence of definite cerebral venous sinus thrombosis or cortical vein thrombosis on MRV and CT venogram I do not feel strongly that she needs anticoagulation.   Plan to mobilize out of bed. Rehab consult. DC hypertonic saline protocol. Transfer to floor bed. Increase iv hydration.Hopefully rehab transfer next few days.  I had a long discussion at the bedside with the patient's husband and mother and explained her prognosis, plan of care and answered questions.  This patient is critically ill and at significant risk of neurological worsening, death and care requires constant monitoring of vital signs, hemodynamics,respiratory and cardiac monitoring, extensive review of multiple databases, frequent neurological assessment, discussion with family, other specialists and medical decision making of high complexity.I have made any additions or clarifications directly to the above note.This critical care time does not reflect procedure time, or teaching time or supervisory time of PA/NP/Med Resident etc but could involve care discussion time.  I spent 32 minutes of neurocritical care time  in the care of  this patient.   Antony Contras, MD Medical Director [facility] Pager: [phone] 05/24/2015 8:42  AM     To contact Stroke Continuity provider, please refer to [email or web address] After hours, contact General Neurology

## 2015-05-25 ENCOUNTER — Inpatient Hospital Stay (HOSPITAL_COMMUNITY): Payer: BLUE CROSS/BLUE SHIELD

## 2015-05-25 ENCOUNTER — Encounter (HOSPITAL_COMMUNITY): Payer: Self-pay

## 2015-05-25 LAB — BASIC METABOLIC PANEL
ANION GAP: 10 (ref 5–15)
BUN: 12 mg/dL (ref 6–20)
CO2: 26 mmol/L (ref 22–32)
CREATININE: 0.54 mg/dL (ref 0.44–1.00)
Calcium: 8.4 mg/dL — ABNORMAL LOW (ref 8.9–10.3)
Chloride: 105 mmol/L (ref 101–111)
GLUCOSE: 128 mg/dL — AB (ref 65–99)
POTASSIUM: 2.2 mmol/L — AB (ref 3.5–5.1)
SODIUM: 141 mmol/L (ref 135–145)

## 2015-05-25 LAB — LUPUS ANTICOAGULANT PANEL
DRVVT: 42.9 s (ref 0.0–55.1)
PTT LA: 37.5 s (ref 0.0–50.0)

## 2015-05-25 MED ORDER — POTASSIUM CHLORIDE CRYS ER 20 MEQ PO TBCR
20.0000 meq | EXTENDED_RELEASE_TABLET | Freq: Once | ORAL | Status: AC
  Administered 2015-05-25: 20 meq via ORAL
  Filled 2015-05-25: qty 1

## 2015-05-25 MED ORDER — POTASSIUM CHLORIDE CRYS ER 20 MEQ PO TBCR: 20.0000 meq | EXTENDED_RELEASE_TABLET | Freq: Once | ORAL | Status: DC

## 2015-05-25 MED ORDER — POTASSIUM CHLORIDE CRYS ER 20 MEQ PO TBCR
20.0000 meq | EXTENDED_RELEASE_TABLET | Freq: Two times a day (BID) | ORAL | Status: DC
  Administered 2015-05-26: 20 meq via ORAL
  Filled 2015-05-25: qty 1

## 2015-05-25 MED ORDER — POTASSIUM CHLORIDE CRYS ER 20 MEQ PO TBCR
30.0000 meq | EXTENDED_RELEASE_TABLET | Freq: Once | ORAL | Status: AC
  Administered 2015-05-25: 30 meq via ORAL
  Filled 2015-05-25 (×2): qty 1

## 2015-05-25 NOTE — PMR Pre-admission (Signed)
PMR Admission Coordinator Pre-Admission Assessment  Patient: Ashley [name] is an 39 y.o., female MRN: [id number] DOB: 11-27-1975 Height: 5\' 3"  (160 cm) Weight: 66.6 kg (146 lb 13.2 oz)              Insurance Information HMO:     PPO:      PCP:      IPA:      80/20:      OTHER: [employer] Silver PRIMARY: BC BS      Policy#:  [id number]      Subscriber:  self CM Name:  Ashley [name]      Phone#:  [phone]     Fax#:  [phone] Pre-Cert#:  [id number], authorized for IP rehab through 06/07/15, update due to chuck on 06/06/15      Employer:  [employer] Benefits:  Phone #:  online     Name:  Ashley [name]:  10/13/14     Deduct:  $3500      Out of Pocket Max:  [phone]      Life Max:  none CIR:  70%/30%      SNF: 70%/30% Outpatient:  70%/30%     Co-Pay: $25 copay/ 30 visits max Home Health:  70%      Co-Pay: 30% DME:  70%     Co-Pay: 30% Providers:  In network SECONDARY:       Policy#:       Subscriber: CM Name:       Phone#:      Fax#:  Pre-Cert#:       Employer:  Benefits:  Phone #:      Name:  Ashley [name]:      Deduct:       Out of Pocket Max:       Life Max:  CIR:       SNF:  Outpatient:      Co-Pay:  Home Health:       Co-Pay:  DME:      Co-Pay:   Medicaid Application [name]:       Case Manager:  Disability Application [name]:       Case Worker:   Emergency Contact Information Contact Information    Name Relation Home Work Mobile   Witkop,Steven Spouse [phone]  [phone]     Current Medical History  Patient Admitting Diagnosis: 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 female two weeks post-partum with complaints of HA on and off since delivery. She was admitted via [facility] with severe HA and developed right facial droop with aphasia and right sided weakness after admission. She was transferred to [facility] and developed right focal seizure en route treated with ativan. MRI/MRA brain done  revealing acute large LEFT parietal intraparenchymal hematoma dissecting along the corpus callosum with intraventricular extension, scattered SAH with findings suggestive of postpartum angiopathy, no obstructive hydrocephalus and no venous thrombosis. MRV without dural venous sinus thrombosis. EEG showed evidence of left sided (periodic lateral epileptiform discharges)PLEDs and patient was loaded with Keppra and started on Keppra 750 mg bid. ICP treated with hypertonic saline and induced hypernatremia. Follow up CCT showed large left fronto-parietal hematoma with progression of vasogenic edema and extensive SAH left hemisphere. She was started on hypertonic saline to help with edema control and CCT 08/12 shows stable large left posterior frontoparietal lobe hematoma with mild surrounding vasogenic edema, 4 mm of left-to-right midline shift and no hydrocephalus. Dr. Leonie Man does not feel anticoagulation needed  in absence of definite cerebral venous sinus/cortical vein thrombosis.   Therapy ongoing and patient showing improvement in activity tolerance. Swallow evaluation done and patient started on dysphagia 3, nectar liquids. Hypokalemia with K+ 2.2 on 08/12 being supplemented. Patient with dense left hemiplegia, aphasia as well as dysphagia. CIR recommended by MD and rehab team and patient cleared to admit 05/26/15 Total: 19 NIH    Past Medical History  Past Medical History  Diagnosis [name]  . Anxiety     Family History  family history includes Cancer in her father and mother.  Prior Rehab/Hospitalizations:  Has the patient had major surgery during 100 days prior to admission? No; pt. Delivered baby ~2 1/2 weeks ago  Current Medications   Current facility-administered medications:  .  acetaminophen (TYLENOL) tablet 1,000 mg, 1,000 mg, Oral, Q6H PRN, Garvin Fila, MD .  acetaminophen (TYLENOL) tablet 650 mg, 650 mg, Oral, Q4H PRN, 650 mg at 05/24/15 0036 **OR** [DISCONTINUED] acetaminophen  (TYLENOL) suppository 650 mg, 650 mg, Rectal, Q4H PRN, Alexis Goodell, MD, 650 mg at 05/23/15 1154 .  enoxaparin (LOVENOX) injection 40 mg, 40 mg, Subcutaneous, Daily, Garvin Fila, MD, 40 mg at 05/25/15 0830 .  hydrALAZINE (APRESOLINE) injection 5 mg, 5 mg, Intravenous, Once, Walidah N Karim, CNM, 5 mg at 05/21/15 0217 .  labetalol (NORMODYNE,TRANDATE) injection 10-40 mg, 10-40 mg, Intravenous, Q10 min PRN, Alexis Goodell, MD .  levETIRAcetam (KEPPRA) 750 mg in sodium chloride 0.9 % 100 mL IVPB, 750 mg, Intravenous, Q12H, Drema Dallas, DO, 750 mg at 05/25/15 0830 .  LORazepam (ATIVAN) injection 1 mg, 1 mg, Intravenous, Q1H PRN, Alexis Goodell, MD, 1 mg at 05/21/15 0554 .  ondansetron (ZOFRAN) injection 4 mg, 4 mg, Intravenous, Q6H PRN, Garvin Fila, MD, 4 mg at 05/24/15 0048 .  pantoprazole (PROTONIX) injection 40 mg, 40 mg, Intravenous, QHS, Alexis Goodell, MD, 40 mg at 05/24/15 2157 .  potassium chloride (K-DUR,KLOR-CON) CR tablet 30 mEq, 30 mEq, Oral, Once, Garvin Fila, MD .  potassium chloride SA (K-DUR,KLOR-CON) CR tablet 20 mEq, 20 mEq, Oral, Once, Garvin Fila, MD .  potassium chloride SA (K-DUR,KLOR-CON) CR tablet 20 mEq, 20 mEq, Oral, Once, Garvin Fila, MD .  Derrill Memo ON 05/26/2015] potassium chloride SA (K-DUR,KLOR-CON) CR tablet 20 mEq, 20 mEq, Oral, BID, Garvin Fila, MD .  prenatal multivitamin tablet 1 tablet, 1 tablet, Oral, Q1200, Garvin Fila, MD, 1 tablet at 05/25/15 1342 .  senna-docusate (Senokot-S) tablet 1 tablet, 1 tablet, Oral, BID, Alexis Goodell, MD, 1 tablet at 05/25/15 1342  Patients Current Diet: DIET DYS 3 Room service appropriate?: Yes; Fluid consistency:: Nectar Thick  Precautions / Restrictions Precautions Precautions: Fall Precaution Comments: right extensor tone Restrictions Weight Bearing Restrictions: No   Has the patient had 2 or more falls or a fall with injury in the past year?No  Prior Activity Level Community (5-7x/wk): In  addition to a two week old son, pt. is the mother to a 80 and 26 year old from her first marriage.  Her husband states pt. is very acitve and involved in her children.  She has taken the summer off from her job and was enjoying getting her children to the pool.  She also was slow running 4 miles on a treadmill as recently as 3 days prior the delivery of the baby  Development worker, international aid / [place] Devices/Equipment: None Home Equipment: None  Prior Device Use: Indicate devices/aids used by the patient prior to  current illness, exacerbation or injury? None of the above  Pt. 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  She runs frequently   Self Care: Did the patient need help bathing, dressing, using the toilet or eating?  Independent  Indoor Mobility: Did the patient need assistance with walking from room to room (with or without device)? Independent  Stairs: Did the patient need assistance with internal or external stairs (with or without device)? Independent  Functional Cognition: Did the patient need help planning regular tasks such as shopping or remembering to take medications? Independent  Current Functional Level Cognition  Arousal/Alertness: Awake/alert Overall Cognitive Status: Impaired/Different from baseline Difficult to assess due to: Impaired communication Current Attention Level: Sustained Orientation Level: Oriented to person Following Commands: Follows one step commands with increased time General Comments: Pt follows one step commands inconsitently.   She appeared to recognize infant son, and interacted appropriately with him within confines of physical and communication impairments     Extremity Assessment (includes Sensation/Coordination)  Upper Extremity Assessment: RUE deficits/detail RUE Deficits /  Details: Pt demonstrates full PROM, but extensor spasticity present.  Pt does seem to be able to inhibit spasticity when asked to perform a movement with her arm, was able to passively guide her with no increased spasticity  RUE Coordination: decreased fine motor, decreased gross motor  Lower Extremity Assessment: Defer to PT evaluation RLE Deficits / Details: excessive PF tone with continuous clonus with stretch to achilles, unable to move past about 30 degrees plantarflexion even with knee flexed, increased tone throughout rest of leg, but less than at ankle, no voluntary movement noted    ADLs  Overall ADL's : Needs assistance/impaired Eating/Feeding: Maximal assistance, Sitting Grooming: Wash/dry hands, Wash/dry face, Brushing hair, Maximal assistance, Moderate assistance Grooming Details (indicate cue type and reason): Pt able to identify "comb".  but unable to demonstrate use initially.  With verbal cues and gestures, she was able to demonstrate use, but demonstrated signfiicant difficulty positioning comb appropriately in relation to hair/head requiring hand over hand assist to complete task  Upper Body Bathing: Maximal assistance, Sitting Lower Body Bathing: Total assistance, Sit to/from stand Upper Body Dressing : Total assistance, Sitting Lower Body Dressing: Total assistance, Sit to/from stand, Sitting/lateral leans Toilet Transfer: Maximal assistance, +2 for safety/equipment, Squat-pivot, BSC Toileting- Clothing Manipulation and Hygiene: Total assistance, Sitting/lateral lean Functional mobility during ADLs: Maximal assistance, +2 for physical assistance    Mobility  Overal bed mobility: Needs Assistance Bed Mobility: Supine to Sit Supine to sit: Mod assist General bed mobility comments: rolling to left with assist for right LE and assist to llift trunk upright    Transfers  Overall transfer level: Needs assistance Equipment used: 2 person hand held assist Transfers: Sit  to/from Stand, Stand Pivot Transfers Sit to Stand: Mod assist, +2 physical assistance, Max assist Stand pivot transfers: Mod assist, +2 physical assistance Squat pivot transfers: Max assist, +2 physical assistance General transfer comment: standing with assist for right LE blocking knee and hip and assisting with upper trunk extension at axilla; assist for moving right leg with pivotal steps to chair.    Ambulation / Gait / Stairs / Wheelchair Mobility  Ambulation/Gait General Gait Details: not tested due to HA in sitting and pt fearful due to right side weakness    Posture / Balance Dynamic Sitting Balance Sitting balance -  Comments: min A to supervision cues for anterior weight shift sitting about 4 minutes while getting hair combed Balance Overall balance assessment: Needs assistance Sitting balance-Leahy Scale: Poor Sitting balance - Comments: min A to supervision cues for anterior weight shift sitting about 4 minutes while getting hair combed Postural control: Posterior lean Standing balance support: Bilateral upper extremity supported Standing balance-Leahy Scale: Poor Standing balance comment: stood about 45 sec with mod assist bilateral UE support and blocking right knee and hip cues for static standing due to moving left LE forward.    Special needs/care consideration BiPAP/CPAP  no CPM  no Continuous Drip IV  no Dialysis no         Life Vest  no Oxygen  no Special Bed  no Skin ecchymosis                             Location L arm Bowel mgmt: 05/21/15 per chart Bladder mgmt: urinary catheter Diabetic mgmt no     Previous Home Environment Living Arrangements: Spouse/significant other, Children  Lives With: Family Available Help at Discharge: Family, Available 24 hours/day Type of Home: House Home Layout: Two level, Bed/bath upstairs Alternate Level Stairs-Number of Steps: full flight  Home Access: Stairs to enter [employer] of Steps: 3-4 in front and one  via garage Bathroom Toilet: [place]: No Additional Comments: Pt lives with spouse, and 106 week old son.  She shares custody of children ages 24 and 55.  Pt's parents and in-laws are retired and able to provide assist as are her sisters in laws   Discharge Living Setting Plans for Discharge Living Setting: Patient's home Type of Home at Discharge: House Discharge Home Layout: Two level, Bed/bath upstairs Discharge Home Access: Stairs to enter Entrance Stairs-Rails: None Entrance Stairs-Number of Steps: 1-2 at garage entrance Discharge Bathroom Shower/Tub: Tub/shower unit Discharge Bathroom Toilet: Standard Discharge Bathroom Accessibility: Yes How Accessible: Accessible via walker Does the patient have any problems obtaining your medications?: No  Social/Family/Support Systems Patient Roles: Spouse, Parent, Other (Comment) (has 14 week old baby and 75 and 40 year old) Anticipated Caregiver: husband, [name] Selma and pt's mom Jhonnie Garner Anticipated Caregiver's Contact Information: [name] Buboltz, spouse [phone] and pt's mom Jhonnie Garner [phone] Ability/Limitations of Caregiver: husband is self employed with 15 employees.  He states he could take a month off if he needed to.  Also, pt's mom moved in to their home several months ago and plans to stay indefinitely.  She is assisting in caring for the baby .  Pt' has joint custody of her other 2 children, ages 55 and 52 Caregiver Availability: 24/7 Discharge Plan Discussed with Primary Caregiver: Yes Is Caregiver In Agreement with Plan?: Yes Does Caregiver/Family have Issues with Lodging/Transportation while Pt is in Rehab?: No   Goals/Additional Needs Patient/Family Goal for Rehab: min assist PT/OT/SLP Expected length of stay: 20-25 days Cultural Considerations: none Dietary Needs: D3 nectar thick liquids Equipment Needs: TBA Special Service Needs: pt.is lactating Pt/Family Agrees to Admission and willing to  participate: Yes Program Orientation Provided & Reviewed with Pt/Caregiver Including Roles  & Responsibilities: Yes   Decrease burden of Care through IP rehab admission:   no   Possible need for SNF placement upon discharge:  Not anticipated   Patient Condition: This patient's medical and functional status has changed since the consult dated: 05/23/15  in which the Rehabilitation Physician determined and documented that the patient's condition  is appropriate for intensive rehabilitative care in an inpatient rehabilitation facility. See "History of Present Illness" (above) for medical update. Functional changes are: pt. Remains severly perseverative with ongoing aphasia, is on dysphagia 3 diet with nectar thick liquids. Patient's medical and functional status update has been discussed with the Rehabilitation physician and patient remains appropriate for inpatient rehabilitation. Will admit to inpatient rehab tomorrow, Saturday. 05/26/15  Preadmission Screen Completed By:  Gerlean Ren, 05/25/2015 4:01 PM ______________________________________________________________________   Discussed status with Dr.  Letta Pate on 05/25/15 at 21   and received telephone approval for admission tomorrow, Saturday 05/26/15  Admission Coordinator:  Gerlean Ren, time 1600 /[name] 05/25/15

## 2015-05-25 NOTE — Progress Notes (Signed)
OT Cancellation Note  Patient Details Name: Ashley [name] MRN: [id number] DOB: [birth year]   Cancelled Treatment:    Reason Eval/Treat Not Completed: Patient at procedure or test/ unavailable (with Rehab PA. Will return if able.)  [facility], OTR/L  [phone] 05/25/2015 05/25/2015, 4:37 PM

## 2015-05-25 NOTE — Progress Notes (Signed)
Inpatient Rehabilitation  I have received insurance authorization for admitting pt. to IP Rehab today and   I have medical clearance from Dr. Leonie Man.  Husband and pt. agreeable to admission and verbalize they are ready for pt. to begin her IP Rehab.  Per Dr. Leonie Man, pt's follow up CT scan today is stable, though pt. has been sleepy.  He believes pt. is not sleeping well at night.    I have updated Courtney Robarge, CM and Dysheka Bibbs, SW as well as pt's RN Havy  of planned admit. Please call if questions.  [place] Admissions Coordinator Cell [phone] Office [phone]

## 2015-05-25 NOTE — Progress Notes (Addendum)
PT Cancellation Note  Patient Details Name: Ashley [name] MRN: [id number] DOB: April 11, 1976   Cancelled Treatment:    Reason Eval/Treat Not Completed: Other (comment) Holding PT tx as pt hypokalemic with potassium of 2.2 Pt accepted to CIR and will plan to d/c there tomorrow once potassium is replaced.   Marguarite Arbour A Sylvia Kondracki 05/25/2015, 1:01 PM Wray Kearns, [place], DPT [phone]

## 2015-05-25 NOTE — Progress Notes (Signed)
Speech Language Pathology Treatment: Dysphagia  Patient Details Name: Ashley [name] MRN: [id number] DOB: September 23, 1976 Today's Date: 05/25/2015 Time: 5498-2641 SLP Time Calculation (min) (ACUTE ONLY): 12 min  Assessment / Plan / Recommendation Clinical Impression  Patient lethargic up in chair as a result session focused on addressing thin liquids.  SLP provided water via cup and straw which patient consumed with single, large sips with multiple swallows.  At times, patient demonstrated oral holding of bolus in what appeared to be preparation of swallow, this along with no anterior loss of bolus indicates improved oral control.  As a result, recommend advancement to thin liquids.  Family educated on need for full supervision/assist and for PO intake when awake and alert.  Plan for SLP on CIR to follow up with patient tomorrow.         HPI Other Pertinent Information: 39 y.o. female who is two weeks post-partum with headache.  ED 05/20/2015 - right weakness, aphasia.  CT of the brain shows L parietal ICH w/ SAH and midline shift.    Pertinent Vitals Pain Assessment: Faces Faces Pain Scale: Hurts a little bit  SLP Plan  Continue with current plan of care    Recommendations Diet recommendations: Dysphagia 3 (mechanical soft);Thin liquid Liquids provided via: Cup;Straw Medication Administration: Whole meds with puree Supervision: Staff to assist with self feeding;Full supervision/cueing for compensatory strategies Compensations: Slow rate;Small sips/bites;Check for pocketing Postural Changes and/or Swallow Maneuvers: Seated upright 90 degrees (when awake and alert)              Oral Care Recommendations: Oral care BID Follow up Recommendations: Inpatient Rehab Plan: Continue with current plan of care    GO    Carmelia Roller., CCC-SLP [phone]  [place] 05/25/2015, 4:15 PM

## 2015-05-25 NOTE — Progress Notes (Signed)
Occupational Therapy Treatment Patient Details Name: Ashley [name] MRN: [id number] DOB: May 06, 1976 Today's Date: 05/25/2015    History of present illness English Ashley [name] is an 39 y.o. female who is two weeks post-partum who developed a headache; CT of the brain shows L parietal ICH w/ SAH and midline shift.   OT comments  Pt seen with family to address established goals. Pt making steady progress. Anticipate D/C to CIR. Continue to follow acutely.   Follow Up Recommendations  CIR;Supervision/Assistance - 24 hour    Equipment Recommendations  3 in 1 bedside comode;Tub/shower bench    Recommendations for Other Services Rehab consult    Precautions / Restrictions Precautions Precautions: Fall Precaution Comments: RUE flexortone       Mobility Bed Mobility                  Transfers                      Balance     Sitting balance-Leahy Scale: Poor                             ADL Overall ADL's : Needs assistance/impaired Eating/Feeding: Minimal assistance;Supervision/ safety;Set up Eating/Feeding Details (indicate cue type and reason): Pt feeding self with finger food with mod encouragement. Spillage from R corner of mouth noted                                   General ADL Comments: Seen during evening meal with family. Family feeding pt. educated family o nimportance of pt feeding self to increase attention to task and safetywith eating. Pt able to feed self finger foods and drink juice from a straw. Encouraged increased po intake. Educated family on R inattention and use of hand over hand to have pt increase her awareness of her R side through self ROM, washing affected arm and rubbing on her scented lotion. also encouraged family to sit on affected side when visiting.       Vision                 Additional Comments: will further assess   Perception     Praxis      Cognition   Behavior During Therapy: Flat  affect Overall Cognitive Status: Impaired/Different from baseline Area of Impairment: Attention;Safety/judgement;Awareness;Problem solving   Current Attention Level: Sustained    Following Commands: Follows one step commands with increased time Safety/Judgement: Decreased awareness of safety;Decreased awareness of deficits Awareness: Intellectual Problem Solving: Slow processing;Decreased initiation;Difficulty sequencing;Requires verbal cues;Requires tactile cues General Comments: Improvement from evaluation    Extremity/Trunk Assessment               Exercises Other Exercises Other Exercises: anterior weightshifts with bringing trunk forward in sitting and maintaining midline postural control with min A. Weight bearing through RUE during work on trunk. Pt able to maintain upright sitting edge of chair with S for @ 15 seconds before fatiguing Other Exercises: RUE PROM - increased flexor tone. no active movement noted.   Shoulder Instructions       General Comments      Pertinent Vitals/ Pain       Pain Assessment: Faces Pain Score: 0-No pain Faces Pain Scale: Hurts a little bit  Home Living  Prior Functioning/Environment              Frequency Min 3X/week     Progress Toward Goals  OT Goals(current goals can now be found in the care plan section)  Progress towards OT goals: Progressing toward goals  Acute Rehab OT Goals Patient Stated Goal: to get better OT Goal Formulation: With family Time For Goal Achievement: 06/05/15 Potential to Achieve Goals: Good ADL Goals Pt Will Perform Eating: with set-up;with supervision;sitting Pt Will Perform Grooming: sitting;with supervision Pt Will Perform Upper Body Bathing: with min assist;sitting Pt Will Transfer to Toilet: with mod assist;stand pivot transfer;bedside commode Pt Will Perform Toileting - Clothing Manipulation and hygiene: with mod assist;sit  to/from stand Pt/caregiver will Perform Home Exercise Program: Increased ROM;Right Upper extremity;With Supervision;With written HEP provided Additional ADL Goal #1: Pt will use Rt UE as stabilizer/ support during ADL activities.  Additional ADL Goal #2: Pt will maintain dynamic sitting balance with min A in prep for increased independence with ADLs.  Additional ADL Goal #4: Pt will locate ADL items on Rt side with min verbal cues.   Plan Discharge plan remains appropriate    Co-evaluation                 End of Session     Activity Tolerance Patient tolerated treatment well   Patient Left in chair;with call bell/phone within reach;with chair alarm set;with family/visitor present   Nurse Communication Mobility status        Time: 2641-5830 OT Time Calculation (min): 18 min  Charges: OT General Charges $OT Visit: 1 Procedure OT Treatments $Self Care/Home Management : 8-22 mins  Ashley [name],Ashley [name] 05/25/2015, 5:55 PM   [facility], OTR/L  [phone] 05/25/2015

## 2015-05-25 NOTE — Progress Notes (Addendum)
Inpatient Rehabilitation  Note that pt's K+ is low .  Have discussed with Leim Fabry, neuro PA.  We will plan to admit pt. tomorrow as long as pt. is medically stable.  Please call if questions.  I have notified pt's RN Havy as well as Lorne Skeens, CM and Dysheka Bibbs, SW. I have discussed with pt's husband by phone.  He understand that the plan will be to admit tomorrow pending medical stability.  [place] Admissions Coordinator Cell [phone] Office [phone]

## 2015-05-25 NOTE — Progress Notes (Signed)
STROKE TEAM PROGRESS NOTE   HISTORY Ashley [name] is an 39 y.o. female who is two weeks post-partum who developed a headache today (05/20/2015 at 1600) that she felt was one of her migraines. The pain became so severe that she called the OB on call. OTC medications were tired with no relief and the patient presented to [facility] for evaluation. While being evaluated the patient developed right sided weakness. She eventually progressed to the point that she was also unable to communicate effectively. Code stroke was called and the patient was transferred to [facility]. On arrival patient with right focal seizure. Patient has been having headaches for the past couple of days. CT of the brain shows L parietal ICH w/ SAH and midline shift. She was admitted to the neuro ICU for further evaluation and treatment.   SUBJECTIVE (INTERVAL HISTORY) Her  Mother and- husband at bedside.  She is sleepy this morning but husband states that she was up most of the night. Her sodium is now down to normal range but progression this morning is low at 2.2. Repeat CT scan of the head shows expected evolutionary changes in the hematoma with decreasing but persistent edema and 3-4 mm midline shift.  OBJECTIVE Temp:  [97.9 F (36.6 C)-100.1 F (37.8 C)] 100.1 F (37.8 C) (08/12 1102) Pulse Rate:  [55-63] 62 (08/12 1102) Cardiac Rhythm:  [-] Sinus bradycardia (08/11 1630) Resp:  [16-21] 18 (08/12 1102) BP: (112-140)/(60-80) 139/70 mmHg (08/12 1102) SpO2:  [96 %-98 %] 96 % (08/12 1102)  No results for input(s): GLUCAP in the last 168 hours.  Recent Labs Lab 05/20/15 2145 05/20/15 2354  05/23/15 0855 05/23/15 1448 05/23/15 2115 05/24/15 0317 05/25/15 0644  NA 140 140  < > 152* 154* 156* 155* 141  K 4.5 3.5  --   --   --   --   --  2.2*  CL 99* 104  --   --   --   --   --  105  CO2 31  --   --   --   --   --   --  26  GLUCOSE 102* 111*  --   --   --   --   --  128*  BUN 15 13  --   --   --   --   --  12   CREATININE 0.57 0.60  --   --   --   --   --  0.54  CALCIUM 9.3  --   --   --   --   --   --  8.4*  < > = values in this interval not displayed.  Recent Labs Lab 05/20/15 2145  AST 28  ALT 32  ALKPHOS 113  BILITOT 0.4  PROT 6.4*  ALBUMIN 3.7    Recent Labs Lab 05/20/15 2145 05/20/15 2337 05/20/15 2354  WBC 6.0  --   --   NEUTROABS  --  6.4  --   HGB 13.3  --  15.0  HCT 39.9  --  44.0  MCV 88.5  --   --   PLT 238  --   --    No results for input(s): CKTOTAL, CKMB, CKMBINDEX, TROPONINI in the last 168 hours. No results for input(s): LABPROT, INR in the last 72 hours. No results for input(s): COLORURINE, LABSPEC, [place], GLUCOSEU, HGBUR, BILIRUBINUR, KETONESUR, PROTEINUR, UROBILINOGEN, NITRITE, LEUKOCYTESUR in the last 72 hours.  Invalid input(s): APPERANCEUR  No results found for: CHOL, TRIG,  HDL, CHOLHDL, VLDL, LDLCALC No results found for: HGBA1C No results found for: LABOPIA, COCAINSCRNUR, LABBENZ, AMPHETMU, THCU, LABBARB  No results for input(s): ETH in the last 168 hours.   Dg Chest Portable 1 View 05/21/2015   1. Left IJ line has been retracted, and is now seen ending overlying the proximal SVC. 2. No acute cardiopulmonary process seen.    05/21/2015   1. Left IJ line noted ending overlying the right brachiocephalic vein. This should be retracted at least 5 cm and repositioned, as deemed clinically appropriate. 2. Lungs hypoexpanded but grossly clear.    Ct Head Wo Contrast 05/21/2015    Extensive intraparenchymal hemorrhage which arises from the left parietal lobe and extends into the splenium of the corpus callosum and slightly superior to the left lateral ventricle. There is extensive subarachnoid hemorrhage on the left with a small amount of subarachnoid hemorrhage crossing to the right anteriorly. The ventricles are not efface, and there is no midline shift. There is no extra-axial fluid. Elsewhere gray-white compartments appear normal.    MRI HEAD 05/21/2015     Acute large LEFT parietal intraparenchymal hematoma dissecting along the corpus callosum, intraventricular extension without obstructive hydrocephalus. Scattered subarachnoid hemorrhage better seen on today's CT head.  Findings suggest postpartum angiopathy ; postpartum angiopathy typically affects the medium and small vessels which are not well characterized on MRA of the head; follow-up CTA of the head or digital subtraction angiography could be performed as clinically indicated . Location is also typical for venous infarction though, there is no major dural venous sinus thrombosis. Additional consideration are vascular malformation or hemorrhagic mass.   MRA HEAD 05/21/2015    No acute vascular process ; normal MRA head.    MRV HEAD 05/21/2015    No dural venous sinus thrombosis ; normal noncontrast MRV head.     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 No evidence of vascular malformation identified. No cortical vein thrombosis identified. Recommend repeat imaging (MRI Head without and with contrast may be the most beneficial) once the parenchymal hematoma resolves.  PHYSICAL EXAM Frail young Caucasian lady currently not in distress. She has EEG leads on her head. . Afebrile. Head is nontraumatic. Neck is supple without bruit.    Cardiac exam no murmur or gallop. Lungs are clear to auscultation. Distal pulses are well felt. Neurological Exam :  Patient is drowsy but can be easily aroused. Remains aphasic nonfluent speech.. Speaks intermittent sentences. Verbal perseveration.Unable to name or repeat. Follows only simple one-step commands. Left gaze preference but able to look spontaneously to the right past midline. Does not blink to threat on either side. Fundi were not  visualized. Pupils 3 mm equal reactive. Vision acuity cannot be reliably tested. Right lower facial weakness. Tongue midline. Dense right hemiplegia with withdrawal in the right lower extremity to pain and none in the right upper extremity with hypotonia. Purposeful antigravity movements on the left side. Plantars are both downgoing. Gait cannot be tested. ASSESSMENT/PLAN [name] is a 39 y.o. female 2 weeks post partum presenting with severe headaches for several days who developed right hemiparesis and aphasia with resultant right focal seizure in the ED.    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  Resultant  Headache, lethargy, Rt hemiplegia, partial seizures and abnormal EEG  MRI  Dominant left parietal ICH with cytotoxic cerebral edema, IVH and obstructive hydrocephalus, SAH, cannot rule out underlying mass or vascular malformation  MRA  Unremarkable  MRV Unremarkable   LDL pending   HgbA1c pending  Lovenox for VTE prophylaxis DIET DYS 3 Room service appropriate?: Yes; Fluid consistency:: Nectar Thick  no antithrombotic prior to admission  DC 3% saline for cytotoxic cerebral edema 05/24/15  Will treat edema and seizure, will not anticoagulate at this time  Therapy recommendations:  CLR   Disposition:  pending   Seizure  EEG L PLEDS  On Keppra, loaded with 1000 mg, now on 750 q 12h Induced Hypernatremia ; off 3% saline. Sodium normal now Other Stroke Risk Factors  ETOH use  Migraines  2 weeks post partum, lactating  Other Active Problems  Anxiety on prozac  Hypokalemia   Hospital day # 4 I have personally examined this patient, reviewed notes, independently viewed imaging studies, participated in medical decision making and plan of care. I have made any additions or clarifications directly to the above note.  She has presented with 1 week of  intermittent headache followed by speech difficulties and right-sided weakness due to his left frontal parasagittal parenchymal hemorrhage with subarachnoid extension and significant cytotoxic edema and early midline shift. Etiology of the hemorrhage is  indeterminate but cortical vein thrombosis is a strong possibility even though MRV and CT venogram was unremarkable.She will need catheter angiogram in 2-3 months. The patient had been taking multiple doses of ibuprofen for the past few weeks which perhaps may have also contributing to the hemorrhage The patient's management presents a challenge as anticoagulation would be risky given her large intracerebral hematoma hence we will treat conservatively with ICP lowering measures with hypertonic saline and induced hypernatremia. Continue Keppra for seizures  .In the absence of definite cerebral venous sinus thrombosis or cortical vein thrombosis on MRV and CT venogram I do not feel strongly that she needs anticoagulation.   Plan to mobilize out of bed. Rehab consult. DC hypertonic saline protocol. Transfer to floor bed. Increase iv hydration.Hopefully rehab transfer next few days.  I had a long discussion at the bedside with the patient's husband and mother and explained her prognosis, plan of care and answered questions. Plan to replace potassium today and transfer to inpatient rehabilitation in the morning. Start aspirin 81 mg daily for suspected cortical vein thrombosis . Patient will need repeat MRI scan of the brain and follow-up in 2 months as well as catheter angiogram to rule out any underlying small vascular malformation .Discussed with inpatient rehabilitation coordinator  Antony Contras, MD Medical Director [place] Pager: [phone] 05/25/2015 1:20 PM     To contact Stroke Continuity provider, please refer to [email or web address] After hours, contact General Neurology

## 2015-05-25 NOTE — Discharge Summary (Signed)
Stroke Discharge Summary  Patient ID: Ashley [name]   MRN: [id number]      DOB: 18-Sep-1976  Date of Admission: 05/20/2015 Date of Discharge: 05/26/2015  Attending Physician:  No att. providers found, Stroke MD  Consulting Physician(s):    rehabilitation medicine and neuro surgery Dr Letta Pate and Dr Saintclair Halsted  Patient's PCP:  No PCP Per Patient  Discharge Diagnoses: Left Parietal Intraparenchymal Hemorrhage. Active Problems:   ICH (intracerebral hemorrhage)   Seizure disorder, nonconvulsive, with status epilepticus   Cerebral venous thrombosis of cortical vein   Cytotoxic cerebral edema   IVH (intraventricular hemorrhage)   Right flaccid hemiparesis BMI  Body mass index is 26.02 kg/(m^2).  Past Medical History  Diagnosis Date  . Anxiety    Past Surgical History  Procedure Laterality Date  . Dilation and curettage of uterus      Medications to be continued on Rehab   LABORATORY STUDIES CBC    Component Value Date/Time   WBC 9.8 05/26/2015 0500   RBC 4.38 05/26/2015 0500   HGB 12.6 05/26/2015 0500   HCT 38.6 05/26/2015 0500   PLT 160 05/26/2015 0500   MCV 88.1 05/26/2015 0500   MCH 28.8 05/26/2015 0500   MCHC 32.6 05/26/2015 0500   RDW 13.5 05/26/2015 0500   LYMPHSABS 2.3 05/26/2015 0500   MONOABS 0.8 05/26/2015 0500   EOSABS 0.0 05/26/2015 0500   BASOSABS 0.0 05/26/2015 0500   CMP    Component Value Date/Time   NA 140 05/26/2015 1015   K 3.0* 05/26/2015 1015   CL 102 05/26/2015 1015   CO2 29 05/26/2015 1015   GLUCOSE 107* 05/26/2015 1015   BUN 14 05/26/2015 1015   CREATININE 0.54 05/26/2015 1015   CALCIUM 7.7* 05/26/2015 1015   PROT 6.4* 05/20/2015 2145   ALBUMIN 3.7 05/20/2015 2145   AST 28 05/20/2015 2145   ALT 32 05/20/2015 2145   ALKPHOS 113 05/20/2015 2145   BILITOT 0.4 05/20/2015 2145   GFRNONAA >60 05/26/2015 1015   GFRAA >60 05/26/2015 1015   COAGS Lab Results  Component Value Date   INR 1.07 05/20/2015   Lipid PanelNo results found for:  CHOL, TRIG, HDL, CHOLHDL, VLDL, LDLCALC HgbA1C No results found for: HGBA1C Cardiac Panel (last 3 results) No results for input(s): CKTOTAL, CKMB, TROPONINI, RELINDX in the last 72 hours. Urinalysis    Component Value Date/Time   COLORURINE YELLOW 05/20/2015 2148   APPEARANCEUR CLEAR 05/20/2015 2148   LABSPEC <1.005* 05/20/2015 2148   PHURINE 7.0 05/20/2015 2148   GLUCOSEU NEGATIVE 05/20/2015 2148   HGBUR TRACE* 05/20/2015 2148   BILIRUBINUR NEGATIVE 05/20/2015 2148   KETONESUR NEGATIVE 05/20/2015 2148   PROTEINUR NEGATIVE 05/20/2015 2148   UROBILINOGEN 0.2 05/20/2015 2148   NITRITE NEGATIVE 05/20/2015 2148   LEUKOCYTESUR NEGATIVE 05/20/2015 2148   Urine Drug Screen No results found for: LABOPIA, COCAINSCRNUR, LABBENZ, AMPHETMU, THCU, LABBARB  Alcohol Level No results found for: [facility]   SIGNIFICANT DIAGNOSTIC STUDIES  Imaging  Dg Chest Portable 1 View 05/21/2015 1. Left IJ line has been retracted, and is now seen ending overlying the proximal SVC. 2. No acute cardiopulmonary process seen.  05/21/2015 1. Left IJ line noted ending overlying the right brachiocephalic vein. This should be retracted at least 5 cm and repositioned, as deemed clinically appropriate. 2. Lungs hypoexpanded but grossly clear.   Ct Head Wo Contrast 05/21/2015 Extensive intraparenchymal hemorrhage which arises from the left parietal lobe and extends into the splenium of the corpus callosum  and slightly superior to the left lateral ventricle. There is extensive subarachnoid hemorrhage on the left with a small amount of subarachnoid hemorrhage crossing to the right anteriorly. The ventricles are not efface, and there is no midline shift. There is no extra-axial fluid. Elsewhere gray-white compartments appear normal.   MRI HEAD 05/21/2015  Acute large LEFT parietal intraparenchymal hematoma dissecting along the corpus callosum, intraventricular extension without obstructive hydrocephalus. Scattered  subarachnoid hemorrhage better seen on today's CT head. Findings suggest postpartum angiopathy ; postpartum angiopathy typically affects the medium and small vessels which are not well characterized on MRA of the head; follow-up CTA of the head or digital subtraction angiography could be performed as clinically indicated . Location is also typical for venous infarction though, there is no major dural venous sinus thrombosis. Additional consideration are vascular malformation or hemorrhagic mass.   MRA HEAD 05/21/2015  No acute vascular process ; normal MRA head.   MRV HEAD 05/21/2015  No dural venous sinus thrombosis ; normal noncontrast MRV head.   EEG  Abnormal EEG due to the following: 1)Suppressed background rhythm which is likely related to medication 2)Left sided sharp wave activity consistent with Periodic Lateralized Epileptiform Discharges (PLEDs)   CT venogram 05/23/15 : Normal dural venous sinus patency. No evidence of vascular malformation identified. No cortical vein thrombosis identified. Recommend repeat imaging (MRI Head without and with contrast may be the most beneficial) once the parenchymal hematoma resolves.    HISTORY OF PRESENT ILLNES Ashley [name] is an 39 y.o. female who is two weeks post-partum who developed a headache 05/21/2015  that she felt was one of her migraines. The pain became so severe that she called the OB on call. OTC medications were tired with no relief and the patient presented to [facility] for evaluation. While being evaluated the patient developed right sided weakness. She eventually progressed to the point that she was also unable to communicate effectively. Code stroke was called and the patient was transferred to [facility]. On arrival patient with right focal seizure.Patient has been having headaches for the past couple of days.  Head CT - Extensive intraparenchymal hemorrhage which arises from the left parietal lobe.    HOSPITAL COURSE Ms.  Ashley [name] is a 39 y.o. female 2 weeks post partum presenting with severe headaches for several days who developed right hemiparesis and aphasia with resultant right focal seizure in the ED.   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   Resultant Headache, lethargy, Rt hemiplegia, partial seizures and abnormal EEG  MRI Dominant left parietal ICH with cytotoxic cerebral edema, IVH and obstructive hydrocephalus, SAH, cannot rule out underlying mass or vascular malformation  MRA Unremarkable  MRV Unremarkable   Lovenox for VTE prophylaxis  DIET DYS 3 Room service appropriate?: Yes; Fluid consistency:: Nectar Thick  no antithrombotic prior to admission  DC 3% saline for cytotoxic cerebral edema 05/24/15  Will treat edema and seizure, will not anticoagulate at this time  Therapy recommendations: CLR   Disposition: pending  Seizure  EEG L PLEDS  On Keppra, loaded with 1000 mg, now on 750 q 12h  Induced Hypernatremia ; off 3% saline. D/C'd 05/24/2015   Other Stroke Risk Factors  ETOH use  Migraines  2 weeks post partum, lactating  Other Active Problems  Anxiety on prozac  Hypokalemia 2.2 on 05/25/2015 - supplement - Recheck 05/26/2015 - 3.0  DISCHARGE EXAM Blood pressure 128/82, pulse 82, temperature 97.8 F (36.6 C), temperature source Oral, resp. rate 16, height 5\' 3"  (1.6 m), weight 66.6 kg (146 lb 13.2 oz), SpO2 98 %, unknown if currently breastfeeding.   Frail young Caucasian lady currently not in distress.  Head is nontraumatic. Neck is supple without bruit. Cardiac exam no murmur or gallop. Lungs are clear to auscultation. Distal pulses are well felt. Neurological Exam :  Patient is drowsy but can be easily aroused. Remains aphasic nonfluent speech.. Speaks intermittent sentences. Verbal  perseveration.Unable to name or repeat. Follows only simple one-step commands. Left gaze preference but able to look spontaneously to the right past midline. Does not blink to threat on either side. Fundi were not visualized. Pupils 3 mm equal reactive. Vision acuity cannot be reliably tested. Right lower facial weakness. Tongue midline. Dense right hemiplegia with withdrawal in the right lower extremity to pain and none in the right upper extremity with hypotonia. Purposeful antigravity movements on the left side. Plantars are both downgoing. Gait cannot be tested.   ASSESSMENT/PLAN Ms. Roselie Cirigliano is a 39 y.o. female 2 weeks post partum presenting with severe headaches for several days who developed right hemiparesis and aphasia with resultant right focal seizure in the ED.  Discharge Diet    liquids  DISCHARGE PLAN  Disposition:  Transfer to [place] for ongoing PT, OT and ST  no antithrombotic for secondary stroke prevention due to hemorrhage.  Recommend ongoing risk factor control by Primary Care Physician at time of discharge from inpatient rehabilitation.  Follow-up No PCP Per Patient in 2 weeks following discharge from rehab.  Follow-up with Dr.Beverlie Kurihara Leonie Man, Stroke Clinic in 2 months.   35 minutes were spent preparing discharge.  Mikey Bussing PA-C [facility] Pager [phone] 05/26/2015, 4:23 PM    I have personally examined this patient, reviewed notes, independently viewed imaging studies, participated in medical decision making and plan of care. I have made any additions or clarifications directly to the above note. Agree with note above.    Antony Contras, MD Medical Director [facility] Pager: [phone] 05/28/2015 8:34 AM

## 2015-05-26 ENCOUNTER — Inpatient Hospital Stay (HOSPITAL_COMMUNITY)
Admission: RE | Admit: 2015-05-26 | Discharge: 2015-06-02 | DRG: 776 | Disposition: A | Discharge status: Other Acute Inpatient Hospital | Payer: BLUE CROSS/BLUE SHIELD | Source: Intra-hospital | Attending: Physical Medicine & Rehabilitation | Admitting: Physical Medicine & Rehabilitation

## 2015-05-26 ENCOUNTER — Encounter (HOSPITAL_COMMUNITY): Payer: Self-pay | Admitting: Rehabilitation

## 2015-05-26 DIAGNOSIS — Z79899 Other long term (current) drug therapy: Secondary | ICD-10-CM

## 2015-05-26 DIAGNOSIS — B009 Herpesviral infection, unspecified: Secondary | ICD-10-CM | POA: Diagnosis not present

## 2015-05-26 DIAGNOSIS — O9089 Other complications of the puerperium, not elsewhere classified: Secondary | ICD-10-CM | POA: Diagnosis present

## 2015-05-26 DIAGNOSIS — G4700 Insomnia, unspecified: Secondary | ICD-10-CM | POA: Diagnosis not present

## 2015-05-26 DIAGNOSIS — I610 Nontraumatic intracerebral hemorrhage in hemisphere, subcortical: Secondary | ICD-10-CM

## 2015-05-26 DIAGNOSIS — I69292 Facial weakness following other nontraumatic intracranial hemorrhage: Secondary | ICD-10-CM

## 2015-05-26 DIAGNOSIS — G8111 Spastic hemiplegia affecting right dominant side: Secondary | ICD-10-CM | POA: Diagnosis present

## 2015-05-26 DIAGNOSIS — I69254 Hemiplegia and hemiparesis following other nontraumatic intracranial hemorrhage affecting left non-dominant side: Secondary | ICD-10-CM

## 2015-05-26 DIAGNOSIS — G8110 Spastic hemiplegia affecting unspecified side: Secondary | ICD-10-CM | POA: Diagnosis not present

## 2015-05-26 DIAGNOSIS — T8351XA Infection and inflammatory reaction due to indwelling urinary catheter, initial encounter: Secondary | ICD-10-CM | POA: Diagnosis not present

## 2015-05-26 DIAGNOSIS — I10 Essential (primary) hypertension: Secondary | ICD-10-CM

## 2015-05-26 DIAGNOSIS — R1310 Dysphagia, unspecified: Secondary | ICD-10-CM

## 2015-05-26 DIAGNOSIS — R4701 Aphasia: Secondary | ICD-10-CM | POA: Diagnosis not present

## 2015-05-26 DIAGNOSIS — G8190 Hemiplegia, unspecified affecting unspecified side: Secondary | ICD-10-CM | POA: Diagnosis not present

## 2015-05-26 DIAGNOSIS — I69220 Aphasia following other nontraumatic intracranial hemorrhage: Secondary | ICD-10-CM | POA: Diagnosis not present

## 2015-05-26 DIAGNOSIS — R569 Unspecified convulsions: Secondary | ICD-10-CM

## 2015-05-26 DIAGNOSIS — N390 Urinary tract infection, site not specified: Secondary | ICD-10-CM | POA: Diagnosis not present

## 2015-05-26 DIAGNOSIS — I959 Hypotension, unspecified: Secondary | ICD-10-CM | POA: Diagnosis not present

## 2015-05-26 DIAGNOSIS — B961 Klebsiella pneumoniae [K. pneumoniae] as the cause of diseases classified elsewhere: Secondary | ICD-10-CM | POA: Diagnosis not present

## 2015-05-26 DIAGNOSIS — I69291 Dysphagia following other nontraumatic intracranial hemorrhage: Secondary | ICD-10-CM | POA: Diagnosis not present

## 2015-05-26 DIAGNOSIS — I69120 Aphasia following nontraumatic intracerebral hemorrhage: Secondary | ICD-10-CM | POA: Diagnosis not present

## 2015-05-26 DIAGNOSIS — I619 Nontraumatic intracerebral hemorrhage, unspecified: Secondary | ICD-10-CM | POA: Diagnosis present

## 2015-05-26 DIAGNOSIS — I611 Nontraumatic intracerebral hemorrhage in hemisphere, cortical: Secondary | ICD-10-CM | POA: Diagnosis not present

## 2015-05-26 LAB — BASIC METABOLIC PANEL
ANION GAP: 9 (ref 5–15)
BUN: 14 mg/dL (ref 6–20)
CHLORIDE: 102 mmol/L (ref 101–111)
CO2: 29 mmol/L (ref 22–32)
CREATININE: 0.54 mg/dL (ref 0.44–1.00)
Calcium: 7.7 mg/dL — ABNORMAL LOW (ref 8.9–10.3)
GFR calc Af Amer: >60 mL/min (ref >60)
GFR calc non Af Amer: >60 mL/min (ref >60)
Glucose, Bld: 107 mg/dL — ABNORMAL HIGH (ref 65–99)
Potassium: 3 mmol/L — ABNORMAL LOW (ref 3.5–5.1)
SODIUM: 140 mmol/L (ref 135–145)

## 2015-05-26 LAB — CBC WITH DIFFERENTIAL/PLATELET
Basophils Absolute: 0 10*3/uL (ref 0.0–0.1)
Basophils Relative: 0 % (ref 0–1)
EOS ABS: 0 10*3/uL (ref 0.0–0.7)
Eosinophils Relative: 0 % (ref 0–5)
HEMATOCRIT: 38.6 % (ref 36.0–46.0)
Hemoglobin: 12.6 g/dL (ref 12.0–15.0)
Lymphocytes Relative: 23 % (ref 12–46)
Lymphs Abs: 2.3 10*3/uL (ref 0.7–4.0)
MCH: 28.8 pg (ref 26.0–34.0)
MCHC: 32.6 g/dL (ref 30.0–36.0)
MCV: 88.1 fL (ref 78.0–100.0)
MONOS PCT: 8 % (ref 3–12)
Monocytes Absolute: 0.8 10*3/uL (ref 0.1–1.0)
NEUTROS ABS: 6.7 10*3/uL (ref 1.7–7.7)
Neutrophils Relative %: 69 % (ref 43–77)
Platelets: 160 10*3/uL (ref 150–400)
RBC: 4.38 MIL/uL (ref 3.87–5.11)
RDW: 13.5 % (ref 11.5–15.5)
WBC: 9.8 10*3/uL (ref 4.0–10.5)

## 2015-05-26 MED ORDER — SODIUM CHLORIDE 0.9 % IJ SOLN
10.0000 mL | INTRAMUSCULAR | Status: DC | PRN
  Administered 2015-05-26: 30 mL
  Administered 2015-05-27 – 2015-05-29 (×4): 10 mL
  Filled 2015-05-26 (×4): qty 40

## 2015-05-26 MED ORDER — SORBITOL 70 % SOLN
30.0000 mL | Freq: Every day | Status: DC | PRN
  Administered 2015-05-26: 30 mL via ORAL
  Filled 2015-05-26: qty 30

## 2015-05-26 MED ORDER — ALUM & MAG HYDROXIDE-SIMETH 200-200-20 MG/5ML PO SUSP: 30.0000 mL | ORAL | Status: DC | PRN

## 2015-05-26 MED ORDER — PROCHLORPERAZINE MALEATE 5 MG PO TABS: 5.0000 mg | ORAL_TABLET | Freq: Four times a day (QID) | ORAL | Status: DC | PRN

## 2015-05-26 MED ORDER — POTASSIUM CHLORIDE CRYS ER 20 MEQ PO TBCR
20.0000 meq | EXTENDED_RELEASE_TABLET | Freq: Two times a day (BID) | ORAL | Status: DC
  Administered 2015-05-26: 20 meq via ORAL
  Filled 2015-05-26: qty 1

## 2015-05-26 MED ORDER — PANTOPRAZOLE SODIUM 40 MG PO TBEC: 40.0000 mg | DELAYED_RELEASE_TABLET | Freq: Every day | ORAL | Status: DC

## 2015-05-26 MED ORDER — GUAIFENESIN-DM 100-10 MG/5ML PO SYRP: 5.0000 mL | ORAL_SOLUTION | Freq: Four times a day (QID) | ORAL | Status: DC | PRN

## 2015-05-26 MED ORDER — LORAZEPAM 2 MG/ML IJ SOLN
1.0000 mg | INTRAMUSCULAR | Status: DC | PRN
  Administered 2015-05-27: 1 mg via INTRAVENOUS
  Filled 2015-05-26: qty 1

## 2015-05-26 MED ORDER — TRAZODONE HCL 50 MG PO TABS
25.0000 mg | ORAL_TABLET | Freq: Every evening | ORAL | Status: DC | PRN
  Administered 2015-05-26: 25 mg via ORAL
  Administered 2015-05-27 – 2015-05-28 (×2): 50 mg via ORAL
  Filled 2015-05-26 (×4): qty 1

## 2015-05-26 MED ORDER — PROCHLORPERAZINE EDISYLATE 5 MG/ML IJ SOLN: 5.0000 mg | Freq: Four times a day (QID) | INTRAMUSCULAR | Status: DC | PRN

## 2015-05-26 MED ORDER — ACETAMINOPHEN 325 MG PO TABS
650.0000 mg | ORAL_TABLET | Freq: Four times a day (QID) | ORAL | Status: DC | PRN
  Administered 2015-05-28 – 2015-06-01 (×12): 650 mg via ORAL
  Filled 2015-05-26 (×14): qty 2

## 2015-05-26 MED ORDER — LEVETIRACETAM 500 MG/5ML IV SOLN
750.0000 mg | Freq: Two times a day (BID) | INTRAVENOUS | Status: DC
  Administered 2015-05-26: 750 mg via INTRAVENOUS
  Filled 2015-05-26 (×3): qty 7.5

## 2015-05-26 MED ORDER — PRENATAL MULTIVITAMIN CH
1.0000 | ORAL_TABLET | Freq: Every day | ORAL | Status: DC
  Administered 2015-05-27 – 2015-06-01 (×6): 1 via ORAL
  Filled 2015-05-26 (×8): qty 1

## 2015-05-26 MED ORDER — ENOXAPARIN SODIUM 40 MG/0.4ML ~~LOC~~ SOLN
40.0000 mg | SUBCUTANEOUS | Status: DC
  Administered 2015-05-26 – 2015-06-01 (×7): 40 mg via SUBCUTANEOUS
  Filled 2015-05-26 (×7): qty 0.4

## 2015-05-26 MED ORDER — BISACODYL 10 MG RE SUPP: 10.0000 mg | Freq: Every day | RECTAL | Status: DC | PRN

## 2015-05-26 MED ORDER — SODIUM CHLORIDE 0.9 % IJ SOLN: 10.0000 mL | INTRAMUSCULAR | Status: DC | PRN

## 2015-05-26 MED ORDER — ONDANSETRON HCL 4 MG/2ML IJ SOLN: 4.0000 mg | Freq: Four times a day (QID) | INTRAMUSCULAR | Status: DC | PRN

## 2015-05-26 MED ORDER — PROCHLORPERAZINE 25 MG RE SUPP: 12.5000 mg | Freq: Four times a day (QID) | RECTAL | Status: DC | PRN

## 2015-05-26 MED ORDER — FLEET ENEMA 7-19 GM/118ML RE ENEM: 1.0000 | ENEMA | Freq: Once | RECTAL | Status: DC | PRN

## 2015-05-26 MED ORDER — SENNOSIDES-DOCUSATE SODIUM 8.6-50 MG PO TABS
1.0000 | ORAL_TABLET | Freq: Two times a day (BID) | ORAL | Status: DC
  Administered 2015-05-26 – 2015-06-01 (×13): 1 via ORAL
  Filled 2015-05-26 (×13): qty 1

## 2015-05-26 MED ORDER — DIPHENHYDRAMINE HCL 12.5 MG/5ML PO ELIX
12.5000 mg | ORAL_SOLUTION | Freq: Four times a day (QID) | ORAL | Status: DC | PRN
  Administered 2015-05-31: 25 mg via ORAL
  Filled 2015-05-26: qty 10

## 2015-05-26 NOTE — Progress Notes (Addendum)
Ashley [name] is a 39 y.o. female December 22, 1975 [id number]  Subjective: Pt was seen on 4N earlier this am. No new problems. Slept well. Feeling OK.  Objective: Vital signs in last 24 hours: Temp:  [97.7 F (36.5 C)-100 F (37.8 C)] 99.6 F (37.6 C) (08/13 1540) Pulse Rate:  [59-82] 66 (08/13 1540) Resp:  [16-20] 16 (08/13 1540) BP: (128-140)/(69-82) 133/78 mmHg (08/13 1540) SpO2:  [97 %-99 %] 99 % (08/13 1540) Weight:  [146 lb 13.2 oz (66.6 kg)] 146 lb 13.2 oz (66.6 kg) (08/13 1540) Weight change:     Intake/Output from previous day:   Last cbgs: CBG (last 3)  No results for input(s): GLUCAP in the last 72 hours.   Physical Exam General: No apparent distress   HEENT: not dry Lungs: Normal effort. Lungs clear to auscultation, no crackles or wheezes. Cardiovascular: Regular rate and rhythm, no edema Abdomen: S/NT/ND; BS(+) Musculoskeletal:  unchanged Neurological: No new neurological deficits. Flat affect. Dysarthric. Aphasic. R hemiplegia. Wounds: n/a    Skin: clear   Mental state: Alert, oriented, cooperative IV line and triple lumen are present    Lab Results: BMET    Component Value Date/Time   NA 140 05/26/2015 1015   K 3.0* 05/26/2015 1015   CL 102 05/26/2015 1015   CO2 29 05/26/2015 1015   GLUCOSE 107* 05/26/2015 1015   BUN 14 05/26/2015 1015   CREATININE 0.54 05/26/2015 1015   CALCIUM 7.7* 05/26/2015 1015   GFRNONAA >60 05/26/2015 1015   GFRAA >60 05/26/2015 1015   CBC    Component Value Date/Time   WBC 9.8 05/26/2015 0500   RBC 4.38 05/26/2015 0500   HGB 12.6 05/26/2015 0500   HCT 38.6 05/26/2015 0500   PLT 160 05/26/2015 0500   MCV 88.1 05/26/2015 0500   MCH 28.8 05/26/2015 0500   MCHC 32.6 05/26/2015 0500   RDW 13.5 05/26/2015 0500   LYMPHSABS 2.3 05/26/2015 0500   MONOABS 0.8 05/26/2015 0500   EOSABS 0.0 05/26/2015 0500   BASOSABS 0.0 05/26/2015 0500    Studies/Results: Ct Head Wo Contrast  05/25/2015   CLINICAL DATA:  Intracranial  hemorrhage follow-up  EXAM: CT HEAD WITHOUT CONTRAST  TECHNIQUE: Contiguous axial images were obtained from the base of the skull through the vertex without intravenous contrast.  COMPARISON:  05/23/2015  FINDINGS: Stable, large left posterior frontoparietal lobe hematoma with extension into the corpus callosum similar in size compared with the prior examination with mild surrounding on the lateral ventricle vasogenic edema. There is mild downward mass effect on the lateral ventricle left greater than right. There is 4 mm of left-to-right midline shift.  There is small volume left hemispheric subarachnoid hemorrhage which has improved compared with 05/23/2015.  There is stable small volume intraventricular hemorrhage dependently within the occipital horns.  There is no other foci of hemorrhage.  The paranasal sinuses are clear. The mastoid sinuses are clear the calvarium is intact. The visualized orbits are unremarkable.  IMPRESSION: 1. Stable, large left posterior frontoparietal lobe hematoma extending into the corpus callosum with mild surrounding vasogenic edema. 4 mm of left-to-right midline shift. No hydrocephalus. 2. Small volume left hemispheric subarachnoid hemorrhage which has improved compared with 05/23/2015. 3. Stable small volume intraventricular hemorrhage dependently within the occipital horns.   Electronically Signed   By: Kathreen Devoid   On: 05/25/2015 08:11    Medications: I have reviewed the patient's current medications.  Assessment/Plan:  1. Functional deficits secondary to Right flaccid hemiplegia secondary to large left  parietal intracranial hemorrhage 2. DVT Prophylaxis/Anticoagulation: Pharmaceutical: Lovenox 3. Pain Management: Tylenol prn 4. Mood: LCSW to follow for evaluation and support as cognition/aphasia improves.  5. Neuropsych: This patient is not capable of making decisions on her own behalf. 6. Skin/Wound Care: Routine pressure relief measures.  7.  Fluids/Electrolytes/Nutrition: Monitor I/O. Check lytes in am D/c triple lumen, change IV 8. Insomnia?: Will order sleep/wake chart. 9. New onset seizures: On keppra bid. 10. HTN: Monitor BP every 8 hours. Being treated prn.     Length of stay, days: 0  Walker Kehr , MD 05/26/2015, 4:51 PM

## 2015-05-26 NOTE — Progress Notes (Signed)
Patient arrived in the unit at 3:30 pm from [place] with RN, NT, spouse, and family members. Patient's stable. No signs and symptoms of distress noted, Denies any pain. Orientation to the unit given to the patient and family members. Patient and family members verbalizes understanding.

## 2015-05-26 NOTE — Progress Notes (Signed)
Pt. Transferred to In patient Rehab via bed at 1535. Arrived to room [phone] and report given to primary RN, Margreta Journey.

## 2015-05-27 ENCOUNTER — Inpatient Hospital Stay (HOSPITAL_COMMUNITY): Payer: BLUE CROSS/BLUE SHIELD | Admitting: Occupational Therapy

## 2015-05-27 ENCOUNTER — Inpatient Hospital Stay (HOSPITAL_COMMUNITY): Payer: BLUE CROSS/BLUE SHIELD | Admitting: Rehabilitation

## 2015-05-27 DIAGNOSIS — R569 Unspecified convulsions: Secondary | ICD-10-CM

## 2015-05-27 LAB — CBC WITH DIFFERENTIAL/PLATELET
BASOS PCT: 0 % (ref 0–1)
Basophils Absolute: 0 10*3/uL (ref 0.0–0.1)
EOS PCT: 2 % (ref 0–5)
Eosinophils Absolute: 0.2 10*3/uL (ref 0.0–0.7)
HCT: 37.4 % (ref 36.0–46.0)
HEMOGLOBIN: 12.6 g/dL (ref 12.0–15.0)
LYMPHS PCT: 15 % (ref 12–46)
Lymphs Abs: 1.6 10*3/uL (ref 0.7–4.0)
MCH: 29.5 pg (ref 26.0–34.0)
MCHC: 33.7 g/dL (ref 30.0–36.0)
MCV: 87.6 fL (ref 78.0–100.0)
MONO ABS: 0.7 10*3/uL (ref 0.1–1.0)
MONOS PCT: 7 % (ref 3–12)
NEUTROS ABS: 8.3 10*3/uL — AB (ref 1.7–7.7)
Neutrophils Relative %: 76 % (ref 43–77)
PLATELETS: 157 10*3/uL (ref 150–400)
RBC: 4.27 MIL/uL (ref 3.87–5.11)
RDW: 13.4 % (ref 11.5–15.5)
WBC: 10.9 10*3/uL — ABNORMAL HIGH (ref 4.0–10.5)

## 2015-05-27 LAB — COMPREHENSIVE METABOLIC PANEL
ALT: 30 U/L (ref 14–54)
AST: 36 U/L (ref 15–41)
Albumin: 2.7 g/dL — ABNORMAL LOW (ref 3.5–5.0)
Alkaline Phosphatase: 64 U/L (ref 38–126)
Anion gap: 7 (ref 5–15)
BILIRUBIN TOTAL: 0.9 mg/dL (ref 0.3–1.2)
BUN: 8 mg/dL (ref 6–20)
CALCIUM: 8 mg/dL — AB (ref 8.9–10.3)
CO2: 27 mmol/L (ref 22–32)
Chloride: 101 mmol/L (ref 101–111)
Creatinine, Ser: 0.37 mg/dL — ABNORMAL LOW (ref 0.44–1.00)
GFR calc non Af Amer: >60 mL/min (ref >60)
GLUCOSE: 98 mg/dL (ref 65–99)
Potassium: 3.1 mmol/L — ABNORMAL LOW (ref 3.5–5.1)
Sodium: 135 mmol/L (ref 135–145)
TOTAL PROTEIN: 5 g/dL — AB (ref 6.5–8.1)

## 2015-05-27 MED ORDER — LEVETIRACETAM 100 MG/ML PO SOLN
1000.0000 mg | Freq: Two times a day (BID) | ORAL | Status: DC
  Administered 2015-05-27 – 2015-05-31 (×8): 1000 mg via ORAL
  Filled 2015-05-27 (×9): qty 10

## 2015-05-27 MED ORDER — LEVETIRACETAM 100 MG/ML PO SOLN
500.0000 mg | Freq: Once | ORAL | Status: AC
  Administered 2015-05-27: 500 mg via ORAL
  Filled 2015-05-27: qty 5

## 2015-05-27 MED ORDER — POTASSIUM CHLORIDE CRYS ER 20 MEQ PO TBCR
20.0000 meq | EXTENDED_RELEASE_TABLET | Freq: Three times a day (TID) | ORAL | Status: DC
  Administered 2015-05-27 (×3): 20 meq via ORAL
  Filled 2015-05-27 (×2): qty 2
  Filled 2015-05-27: qty 1

## 2015-05-27 MED ORDER — LEVETIRACETAM 100 MG/ML PO SOLN
750.0000 mg | Freq: Two times a day (BID) | ORAL | Status: DC
  Administered 2015-05-27: 750 mg via ORAL
  Filled 2015-05-27: qty 10

## 2015-05-27 NOTE — Progress Notes (Signed)
Key Points: Use following P&T approved IV to PO antibiotic change policy.  Description contains the criteria that are approved Note: Policy Excludes:  Esophagectomy patientsPHARMACIST - PHYSICIAN COMMUNICATION DR:   Rehab CONCERNING: IV to Oral Route Change Policy  RECOMMENDATION: This patient is receiving Keppra by the intravenous route.  Based on criteria approved by the Pharmacy and Therapeutics Committee, the intravenous medication(s) is/are being converted to the equivalent oral dose form(s).   DESCRIPTION: These criteria include:  The patient is eating (either orally or via tube) and/or has been taking other orally administered medications for a least 24 hours  The patient has no evidence of active gastrointestinal bleeding or impaired GI absorption (gastrectomy, short bowel, patient on TNA or NPO).  If you have questions about this conversion, please contact the Pharmacy Department  []   [phone] )  [facility] []   [phone] )  [facility] [x]   [phone] )  [facility] []   [phone] )  [facility] []   [phone] )  [place], [place], [facility] 05/27/2015 8:48 AM

## 2015-05-27 NOTE — Evaluation (Signed)
Occupational Therapy Assessment and Plan  Patient Details  Name: Ashley [name] MRN: [id number] Date of Birth: 1976/01/21  OT Diagnosis: abnormal posture, cognitive deficits, disturbance of vision, flaccid hemiplegia and hemiparesis, hemiplegia affecting dominant side and muscle weakness (generalized) Rehab Potential: Rehab Potential (ACUTE ONLY): Good ELOS: 4 weeks   Today's Date: 05/27/2015 OT Individual Time: 1300-1415 OT Individual Time Calculation (min): 75 min     Problem List:  Patient Active Problem List   Diagnosis Date Noted  . Right flaccid hemiparesis 05/23/2015  . ICH (intracerebral hemorrhage)   . Seizure disorder, nonconvulsive, with status epilepticus   . Cerebral venous thrombosis of cortical vein   . Cytotoxic cerebral edema   . IVH (intraventricular hemorrhage)   . Term pregnancy 05/07/2015  . Spontaneous vaginal delivery 05/07/2015    Past Medical History:  Past Medical History  Diagnosis Date  . Anxiety    Past Surgical History:  Past Surgical History  Procedure Laterality Date  . Dilation and curettage of uterus      Assessment & Plan Clinical Impression: Patient is a 39 y.o. year old female two weeks post-partum with complaints of HA on and off since delivery. She was admitted via [facility] with severe HA and developed right facial droop with aphasia and right sided weakness after admission. She was transferred to [facility] and developed right focal seizure en route treated with ativan. MRI/MRA brain done revealing acute large LEFT parietal intraparenchymal hematoma dissecting along the corpus callosum with intraventricular extension, scattered SAH with findings suggestive of postpartum angiopathy, no obstructive hydrocephalus and no venous thrombosis. MRV without dural venous sinus thrombosis. EEG showed evidence of left sided (periodic lateral epileptiform discharges)PLEDs and patient was loaded with Keppra and started on Keppra 750 mg bid. ICP treated with  hypertonic saline and induced hypernatremia. Follow up CCT showed large left fronto-parietal hematoma with progression of vasogenic edema and extensive SAH left hemisphere. She was started on hypertonic saline to help with edema control and CCT 08/12 shows stable large left posterior frontoparietal lobe hematoma with mild surrounding vasogenic edema, 4 mm of left-to-right midline shift and no hydrocephalus. Dr. Leonie Man does not feel anticoagulation needed in absence of definite cerebral venous sinus/cortical vein thrombosis. . Patient transferred to [facility] on 05/26/2015 .   Patient currently requires mod with basic self-care skills secondary to muscle weakness and muscle paralysis, impaired timing and sequencing, abnormal tone, unbalanced muscle activation, motor apraxia, ataxia, decreased coordination and decreased motor planning, decreased visual perceptual skills and decreased visual motor skills, decreased midline orientation, decreased attention to right and right side neglect and decreased initiation, decreased attention, decreased awareness, decreased problem solving, decreased safety awareness and decreased memory.  Prior to hospitalization, patient could complete BADL with independent .  Patient will benefit from skilled intervention to increase independence with basic self-care skills prior to discharge home with care partner.  Anticipate patient will require 24 hour supervision and follow up home health.  OT - End of Session Activity Tolerance: Tolerates 10 - 20 min activity with multiple rests Endurance Deficit Description: fatigued during session OT Assessment Rehab Potential (ACUTE ONLY): Good Barriers to Discharge:  (home modifications needed) OT Plan OT Intensity: Minimum of 1-2 x/day, 45 to 90 minutes OT Frequency: 5 out of 7 days OT Duration/Estimated Length of Stay: 4 weeks OT Treatment/Interventions: Balance/vestibular training;Cognitive remediation/compensation;Discharge  planning;Disease mangement/prevention;DME/adaptive equipment instruction;Functional mobility training;Functional electrical stimulation;Neuromuscular re-education;Pain management;Patient/family education;Psychosocial support;Self Care/advanced ADL retraining;Splinting/orthotics;Therapeutic Activities;Therapeutic Exercise;Visual/perceptual remediation/compensation;Wheelchair propulsion/positioning;UE/LE Coordination activities;UE/LE Strength taining/ROM OT Recommendation Recommendations for Other  Services: Neuropsych consult Patient destination: Home Follow Up Recommendations: Home health OT;Outpatient OT;24 hour supervision/assistance Equipment Recommended: 3 in 1 bedside comode;Standard walker;Tub/shower bench   Skilled Therapeutic Intervention Pt lying in bed.  Husband and Mother present.  Discussed OT and MD orders to hold therapy for rest of day.  Family talked about pt's prior function.  Pt expressed accurate information about 50 % of time.  OT addressed UE NMRE and vision.  Pt. Expressed motivation to get through this experience so she could go home.  She has a 61 wk old boy and 2 children from a previous marriage.  Family supportive.    OT Evaluation Precautions/Restrictions  Precautions Precautions: Fall Precaution Comments: RUE flexortone Restrictions Weight Bearing Restrictions: No       Pain head pain 6/10   Home Living/Prior Functioning Home Living Family/patient expects to be discharged to:: Private residence Living Arrangements: Spouse/significant other, Children, Parent (Mother) Available Help at Discharge: Family, Available 24 hours/day Type of Home: House Home Access: Stairs to enter [employer] of Steps: 3-4 in front and 2 via garage Entrance Stairs-Rails: None Home Layout: Two level, Bed/bath upstairs Alternate Level Stairs-Number of Steps: full flight  Alternate Level Stairs-Rails: Right, Left Bathroom Shower/Tub: Tub/shower unit, Curtain, Other  (comment) Chiropractor bathroom with btub/shower (regular);glass doors; ) Bathroom Toilet: Standard Bathroom Accessibility: Yes Additional Comments: lives with spouse; 2wk yo son; shares custody of 2 children 74yo, 52yo; Parents and inlaws retired & can assist; Also sister n Sports coach.    Lives With: Spouse IADL History Homemaking Responsibilities: Yes Meal Prep Responsibility: Secondary Laundry Responsibility:  (, theresa ) Cleaning Responsibility:  (housekeeper 1 q 2 wks) Bill Paying/Finance Responsibility: Primary Shopping Responsibility: Secondary Child Care Responsibility: Primary Current License: No Mode of Transportation: Car Occupation: Part time employment Media planner for [employer]) Prior Function Level of Independence: Independent with basic ADLs, Independent with homemaking with ambulation, Independent with homemaking with wheelchair, Independent with gait, Independent with transfers  Able to Take Stairs?: Yes Driving: Yes Vocation: Part time employment Leisure: Hobbies-yes (Comment) Comments: Medical sales; running Media planner; likes to run )    Vision/Perception  Vision- History Baseline Vision/History:  (keep) Vision- Assessment Vision Assessment?: Vision impaired- to be further tested in functional context Perception Perception: Impaired Inattention/Neglect: Does not attend to right visual field;Does not attend to right side of body;Impaired-to be further tested in functional context Praxis Praxis: Impaired Praxis Impairment Details: Perseveration (speech perseveration)  Cognition Overall Cognitive Status: Impaired/Different from baseline Orientation Level:  (expressive aphasia) Day of Week: Incorrect Memory: Impaired Memory Impairment: Retrieval deficit;Decreased recall of new information;Storage deficit Memory Recall:  (exprssive aphasia) Attention: Sustained;Focused Focused Attention: Appears intact Sustained Attention: Impaired Sustained Attention  Impairment: Verbal basic;Functional basic Awareness: Impaired Awareness Impairment: Intellectual impairment Problem Solving: Impaired Problem Solving Impairment: Verbal basic;Functional basic Executive Function: Sequencing;Reasoning;Organizing;Decision Making;Initiating;Self Monitoring;Self Correcting Reasoning: Impaired Reasoning Impairment: Verbal basic;Functional basic Sequencing: Impaired Sequencing Impairment: Verbal basic;Functional basic Organizing: Impaired Organizing Impairment: Verbal basic;Functional basic Decision Making: Impaired Decision Making Impairment: Verbal basic;Functional basic Initiating: Impaired Initiating Impairment: Verbal basic;Functional basic Self Monitoring: Impaired Self Monitoring Impairment: Verbal basic;Functional basic Self Correcting: Impaired Self Correcting Impairment: Verbal basic;Functional basic Behaviors: Restless;Verbal agitation;Perseveration;Poor frustration tolerance Safety/Judgment: Impaired Sensation Sensation Light Touch: Impaired Detail Light Touch Impaired Details: Impaired RLE;Impaired RUE Stereognosis: Not tested Hot/Cold: Not tested Proprioception: Impaired by gross assessment Additional Comments: pt indicated she could feel but not accurately Coordination Gross Motor Movements are Fluid and Coordinated: Yes Fine Motor Movements are Fluid and Coordinated:  Yes Coordination and Movement Description:  (RUE= PROM low tone) Motor  Motor Motor: Hemiplegia;Abnormal postural alignment and control;Motor perseverations Motor - Skilled Clinical Observations:  (Brunnstrom stage 1)      Cervical Assessment Cervical Assessment: Exceptions to [facility] Cervical AROM Overall Cervical AROM: Deficits;Due to pain;Due to impaired cognition Cervical Strength Overall Cervical Strength Comments: poor Thoracic Assessment Thoracic Assessment: Exceptions to [facility] Thoracic Strength Overall Thoracic Strength Comments: SEE PT   Balance Balance Balance Assessed:  (see PT) Extremity/Trunk Assessment RUE Assessment RUE Assessment: Exceptions to [facility] RUE Tone RUE Tone: Mild LUE Assessment LUE Assessment: Within Functional Limits  FIM:  FIM - Eating Eating Activity: 3: Helper scoops food on utensil every scoop FIM - Grooming Grooming: 0: Activity did not occur FIM - Bathing Bathing: 0: Activity did not occur FIM - Upper Body Dressing/Undressing Upper body dressing/undressing: 0: Activity did not occur FIM - Lower Body Dressing/Undressing Lower body dressing/undressing: 0: Activity did not occur FIM - Toileting Toileting: 0: Activity did not occur   Refer to Care Plan for Long Term Goals  Recommendations for other services: None  Discharge Criteria: Patient will be discharged from OT if patient refuses treatment 3 consecutive times without medical reason, if treatment goals not met, if there is a change in medical status, if patient makes no progress towards goals or if patient is discharged from hospital.  The above assessment, treatment plan, treatment alternatives and goals were discussed and mutually agreed upon: by patient and by family  Lisa Roca 05/27/2015, 7:23 PM

## 2015-05-27 NOTE — Progress Notes (Signed)
Spoke to Dr. Wallie Char on call Neurologist and new orders noted.

## 2015-05-27 NOTE — Progress Notes (Signed)
PT called this writer that the patient had an episode of syncope spell and suddenly become lethargic for few seconds. As per report patient was twitching her head while her eyes looking n the right direction  for 3 times. Charge notified. Immediately assisted the patient back to bed. Vital signs taken and recorded.  Patient spouse at bedside. Dr. Alain Marion made aware. Called the Neurologist. awaiting for response.

## 2015-05-27 NOTE — Progress Notes (Addendum)
Ashley [name] is a 39 y.o. female May 06, 1976 [id number]  Subjective: Pt came from [facility] on Sat. No new problems. Slept well. Feeling OK.  Objective: Vital signs in last 24 hours: Temp:  [97.4 F (36.3 C)-99.6 F (37.6 C)] 97.4 F (36.3 C) (08/14 0621) Pulse Rate:  [61-82] 61 (08/14 0621) Resp:  [16-20] 17 (08/14 0621) BP: (128-135)/(78-82) 135/82 mmHg (08/14 0621) SpO2:  [97 %-99 %] 97 % (08/14 0621) Weight:  [146 lb 13.2 oz (66.6 kg)] 146 lb 13.2 oz (66.6 kg) (08/13 1540) Weight change:  Last BM Date: 05/21/15 (Received Sorbitol)  Intake/Output from previous day: 08/13 0701 - 08/14 0700 In: 240 [P.O.:240] Out: 1300 [Urine:1300] Last cbgs: CBG (last 3)  No results for input(s): GLUCAP in the last 72 hours.   Physical Exam General: No apparent distress   HEENT: not dry Lungs: Normal effort. Lungs clear to auscultation, no crackles or wheezes. Cardiovascular: Regular rate and rhythm, no edema Abdomen: S/NT/ND; BS(+) Musculoskeletal:  unchanged Neurological: No new neurological deficits. Less flat affect. Dysarthric. Aphasic. R hemiplegia. Wounds: n/a    Skin: clear   Mental state: Alert, oriented, cooperative Foley is in     Lab Results: BMET    Component Value Date/Time   NA 135 05/27/2015 0650   K 3.1* 05/27/2015 0650   CL 101 05/27/2015 0650   CO2 27 05/27/2015 0650   GLUCOSE 98 05/27/2015 0650   BUN 8 05/27/2015 0650   CREATININE 0.37* 05/27/2015 0650   CALCIUM 8.0* 05/27/2015 0650   GFRNONAA >60 05/27/2015 0650   GFRAA >60 05/27/2015 0650   CBC    Component Value Date/Time   WBC 10.9* 05/27/2015 0650   RBC 4.27 05/27/2015 0650   HGB 12.6 05/27/2015 0650   HCT 37.4 05/27/2015 0650   PLT 157 05/27/2015 0650   MCV 87.6 05/27/2015 0650   MCH 29.5 05/27/2015 0650   MCHC 33.7 05/27/2015 0650   RDW 13.4 05/27/2015 0650   LYMPHSABS 1.6 05/27/2015 0650   MONOABS 0.7 05/27/2015 0650   EOSABS 0.2 05/27/2015 0650   BASOSABS 0.0 05/27/2015 0650     Studies/Results: No results found.  Medications: I have reviewed the patient's current medications.  Assessment/Plan:  1. Functional deficits secondary to Right flaccid hemiplegia secondary to large left parietal intracranial hemorrhage 2. DVT Prophylaxis/Anticoagulation: Pharmaceutical: Lovenox 3. Pain Management: Tylenol prn 4. Mood: LCSW to follow for evaluation and support as cognition/aphasia improves.  5. Neuropsych: This patient is not capable of making decisions on her own behalf. 6. Skin/Wound Care: Routine pressure relief measures.  7. Fluids/Electrolytes/Nutrition: Monitor I/O. Check lytes in am. 8. Insomnia?: Will order sleep/wake chart. 9. New onset seizures: On keppra bid. 10. HTN: Monitor BP every 8 hours. Being treated prn. 11. Foley is in  65. Low K - replacing    Length of stay, days: 1  Walker Kehr , MD 05/27/2015, 8:16 AM

## 2015-05-27 NOTE — Evaluation (Signed)
Physical Therapy Assessment and Plan  Patient Details  Name: Ashley [name] MRN: [id number] Date of Birth: [birth year]  PT Diagnosis: Abnormal posture, Abnormality of gait, Cognitive deficits, Difficulty walking, Hemiplegia dominant, Hypertonia, Impaired cognition and Impaired sensation Rehab Potential: Good ELOS: 28 days    Today's Date: 05/27/2015 PT Individual Time: 6659-9357 PT Individual Time Calculation (min): 66 min    Problem List:  Patient Active Problem List   Diagnosis Date Noted  . Right flaccid hemiparesis 05/23/2015  . ICH (intracerebral hemorrhage)   . Seizure disorder, nonconvulsive, with status epilepticus   . Cerebral venous thrombosis of cortical vein   . Cytotoxic cerebral edema   . IVH (intraventricular hemorrhage)   . Term pregnancy 05/07/2015  . Spontaneous vaginal delivery 05/07/2015    Past Medical History:  Past Medical History  Diagnosis Date  . Anxiety    Past Surgical History:  Past Surgical History  Procedure Laterality Date  . Dilation and curettage of uterus      Assessment & Plan Clinical Impression: Patient is a 39 y.o. year old female two weeks post-partum with complaints of HA on and off since delivery. She was admitted via [facility] with severe HA and developed right facial droop with aphasia and right sided weakness after admission. She was transferred to [facility] and developed right focal seizure en route treated with ativan. MRI/MRA brain done revealing acute large LEFT parietal intraparenchymal hematoma dissecting along the corpus callosum with intraventricular extension, scattered SAH with findings suggestive of postpartum angiopathy, no obstructive hydrocephalus and no venous thrombosis. MRV without dural venous sinus thrombosis. EEG showed evidence of left sided (periodic lateral epileptiform discharges)PLEDs and patient was loaded with Keppra and started on Keppra 750 mg bid. ICP treated with hypertonic saline and induced hypernatremia. Follow up  CCT showed large left fronto-parietal hematoma with progression of vasogenic edema and extensive SAH left hemisphere. She was started on hypertonic saline to help with edema control and CCT 08/12 shows stable large left posterior frontoparietal lobe hematoma with mild surrounding vasogenic edema, 4 mm of left-to-right midline shift and no hydrocephalus. Dr. Leonie Man does not feel anticoagulation needed in absence of definite cerebral venous sinus/cortical vein thrombosis. .  Patient transferred to [facility] on 05/26/2015 .   Patient currently requires total to +2A with mobility secondary to muscle weakness, decreased cardiorespiratoy endurance, impaired timing and sequencing, abnormal tone, decreased coordination and decreased motor planning, decreased visual perceptual skills, decreased attention to right and decreased initiation, decreased attention, decreased awareness, decreased problem solving, decreased safety awareness, decreased memory and delayed processing.  Prior to hospitalization, patient was independent  with mobility and lived with Spouse in a House home.  Home access is 3-4 in front and 2 via garageStairs to enter.  Patient will benefit from skilled PT intervention to maximize safe functional mobility, minimize fall risk and decrease caregiver burden for planned discharge home with 24 hour assist.  Anticipate patient will benefit from follow up [facility] at discharge.  PT - End of Session Activity Tolerance: Tolerates 30+ min activity with multiple rests Endurance Deficit: Yes Endurance Deficit Description: Pt doing well throughout session until end when transferring back to w/c from toilet, seemed to have seizure like activity vs vasovagal episode PT Assessment Rehab Potential (ACUTE/IP ONLY): Good PT Patient demonstrates impairments in the following area(s): Balance;Endurance;Motor;Perception;Safety PT Transfers Functional Problem(s): Bed Mobility;Bed to Chair;Car;Furniture PT Locomotion Functional  Problem(s): Ambulation;Wheelchair Mobility;Stairs PT Plan PT Intensity: Minimum of 1-2 x/day ,45 to 90 minutes PT Frequency: 5 out of 7  days PT Duration Estimated Length of Stay: 28 days  PT Treatment/Interventions: Ambulation/gait training;Balance/vestibular training;Cognitive remediation/compensation;Community reintegration;Discharge planning;DME/adaptive equipment instruction;Disease management/prevention;Functional electrical stimulation;Functional mobility training;Neuromuscular re-education;Patient/family education;Psychosocial support;Splinting/orthotics;Stair training;Therapeutic Activities;Therapeutic Exercise;UE/LE Strength taining/ROM;UE/LE Coordination activities;Visual/perceptual remediation/compensation;Wheelchair propulsion/positioning PT Transfers Anticipated Outcome(s): min A  PT Locomotion Anticipated Outcome(s): min A from an ambulatory level  PT Recommendation Recommendations for Other Services: Speech consult;Neuropsych consult Follow Up Recommendations: Home health PT;24 hour supervision/assistance Patient destination: Home Equipment Recommended: To be determined  Skilled Therapeutic Intervention PT assessment and evaluation completed.  Initiated education and training on bed mobility, functional transfers and gait, see full details below.  Note pt unable to take more than a few steps due to increased pushing and increased tone noted in RLE.  Feel that her level of fatigue also playing large factor in these deficits during session.  Note that during gait, pt with some flatulence and signaling that she may need to use restroom.   Assisted back to restroom and transferred via squat pivot at total A level (due to pushing).  Pt able to void large amount of bowel.  Nurse tech in room to assist with peri care and clothing management while PT stood with pt at total A level.  Once back in w/c note that pt with decreased responsiveness and twitching while having R gaze preference.  RN  immediately called to room.  Assisted back to bed with +3A for safety.  RN and charge RN in room to assess vitals.  All WFL.  Note pt with even more fatigue.  Per RN, MD to hold therapies for remainder of day.  Had discussed with husband prior to this regarding ELOS, expected outcomes, pusher syndrome, etc.  Pts husband verbalized understanding.   PT Evaluation Precautions/Restrictions Precautions Precautions: Fall Precaution Comments: RLE flex tone, pushes to the R, R inattention, aphasia Restrictions Weight Bearing Restrictions: No General Chart Reviewed: Yes Family/Caregiver Present: Yes (husband) Vital SignsTherapy Vitals Temp: 98.5 F (36.9 C) Temp Source: Oral Pulse Rate: 65 Resp: 20 BP: 121/69 mmHg Patient Position (if appropriate): Lying Oxygen Therapy SpO2: 98 % O2 Device: Not Delivered Pain: Pt with no reports of pain during session.    Home Living/Prior Functioning Home Living Available Help at Discharge: Family;Available 24 hours/day Type of Home: House Home Access: Stairs to enter [employer] of Steps: 3-4 in front and 2 via garage Entrance Stairs-Rails: None Home Layout: Two level;Bed/bath upstairs Alternate Level Stairs-Number of Steps: full flight  Alternate Level Stairs-Rails: Right;Left Bathroom Shower/Tub: Chiropodist: Standard Additional Comments: lives with spouse; 2wk yo son; shares custody of 2 children 34yo, 2yo; Parents and inlaws retired & can assist; Also sister n Sports coach.    Lives With: Spouse Prior Function Level of Independence: Independent with basic ADLs;Independent with homemaking with ambulation;Independent with homemaking with wheelchair;Independent with gait;Independent with transfers  Able to Take Stairs?: Yes Driving: Yes Vocation: Part time employment Leisure: Hobbies-yes (Comment) Comments: Medical sales; running Media planner; likes to run ) Vision/Perception   See Navigator.  Cognition Overall  Cognitive Status: Impaired/Different from baseline (Difficult to formally assess due to aphasia) Arousal/Alertness: Lethargic Orientation Level: Oriented to person;Disoriented to place;Disoriented to time;Disoriented to situation Attention: Sustained;Focused Focused Attention: Appears intact Sustained Attention: Impaired Sustained Attention Impairment: Functional basic;Verbal basic Memory: Impaired Memory Impairment: Decreased recall of new information Awareness: Impaired Awareness Impairment: Intellectual impairment Behaviors: Impulsive Safety/Judgment: Impaired Comments: Pt somewhat impulsive during session with little awareness of balance deficits, esp in restroom.  QRB donned for safety when in w/c.  Sensation Sensation Light Touch: Impaired Detail Light Touch Impaired Details: Impaired RLE;Impaired RUE Stereognosis: Not tested Hot/Cold: Not tested Proprioception: Impaired by gross assessment Additional Comments: difficult to formally test due to aphasia, but light touch and proprioception seem to be impaired in functional context.  Coordination Gross Motor Movements are Fluid and Coordinated: No Fine Motor Movements are Fluid and Coordinated: No Coordination and Movement Description: Pt with little to no active movement in RUE/LE Heel Shin Test: unable to perform Motor  Motor Motor: Hemiplegia;Abnormal postural alignment and control;Abnormal tone Motor - Skilled Clinical Observations: R hemiplegia, decreased balance, pushes R  Mobility Bed Mobility Bed Mobility: Rolling Right;Right Sidelying to Sit;Sit to Supine Rolling Right: 4: Min assist Rolling Right Details: Tactile cues for initiation;Verbal cues for sequencing;Verbal cues for technique;Verbal cues for precautions/safety;Manual facilitation for weight shifting;Manual facilitation for placement Right Sidelying to Sit: 2: Max assist Right Sidelying to Sit Details: Verbal cues for sequencing;Verbal cues for  technique;Verbal cues for precautions/safety;Manual facilitation for weight shifting;Manual facilitation for weight bearing;Manual facilitation for placement;Tactile cues for initiation;Tactile cues for sequencing Sit to Supine: 1: +2 Total assist Sit to Supine - Details: Verbal cues for sequencing;Verbal cues for technique;Verbal cues for precautions/safety;Manual facilitation for weight shifting;Manual facilitation for weight bearing;Manual facilitation for placement (due to medical change) Locomotion  Ambulation Ambulation: Yes Ambulation/Gait Assistance: 1: +2 Total assist Ambulation Distance (Feet): 3 Feet Assistive device: Other (Comment) ("three muskateer style") Ambulation/Gait Assistance Details: Tactile cues for initiation;Tactile cues for sequencing;Verbal cues for sequencing;Verbal cues for technique;Verbal cues for precautions/safety;Manual facilitation for weight shifting;Manual facilitation for weight bearing;Verbal cues for gait pattern;Manual facilitation for placement Ambulation/Gait Assistance Details: Pt with increased pushing when trying to utilize L handrail, therefore switched to "three muskateer style" however due to pushing and poor midline orientation requires physcial assist to advance LLE as well as RLE.  Also note increased tone in RLE in flexor pattern during gait.  Gait Gait: Yes Gait Pattern: Impaired Gait Pattern: Lateral trunk lean to right;Trunk flexed;Poor foot clearance - left;Poor foot clearance - right;Decreased dorsiflexion - right;Step-to pattern Stairs / Additional Locomotion Stairs: No (unsafe) Architect: Yes Wheelchair Assistance: 3: Building surveyor Details: Tactile cues for initiation;Tactile cues for sequencing;Verbal cues for sequencing;Verbal cues for technique;Verbal cues for precautions/safety Wheelchair Propulsion: Left lower extremity Wheelchair Parts Management: Needs assistance Distance: 60   Trunk/Postural Assessment  Cervical Assessment Cervical Assessment: Exceptions to [facility] Cervical Strength Overall Cervical Strength Comments: Tends to keep head turned to the L Thoracic Assessment Thoracic Assessment: Exceptions to [facility] Thoracic Strength Overall Thoracic Strength Comments: trunk shortening on the R Lumbar Assessment Lumbar Assessment: Exceptions to [facility] Lumbar Strength Overall Lumbar Strength Comments: posterior pelvic tilt in sitting Postural Control Postural Control: Deficits on evaluation Righting Reactions: zero righting reaction in sitting, but could correct with cues for forward weight shift.  Protective Responses: Pt with zero protective response in sitting/standing.   Balance Balance Balance Assessed: Yes Static Sitting Balance Static Sitting - Balance Support: Feet supported;Left upper extremity supported Static Sitting - Level of Assistance: 4: Min assist;2: Max assist Dynamic Sitting Balance Dynamic Sitting - Balance Support: Feet supported;No upper extremity supported Dynamic Sitting - Level of Assistance: 4: Min assist;2: Max Insurance risk surveyor Standing - Balance Support: During functional activity Static Standing - Level of Assistance: 1: +2 Total assist Dynamic Standing Balance Dynamic Standing - Balance Support: During functional activity Dynamic Standing - Level of Assistance: 1: +2 Total assist Extremity Assessment  RLE Assessment RLE Assessment: Exceptions to [facility] RLE Strength RLE Overall Strength: Deficits RLE Overall Strength Comments: No active movement noted in RLE RLE Tone RLE Tone: Moderate Modified Ashworth Scale for Grading Hypertonia RLE:  (to be assessed further) LLE Assessment LLE Assessment: Within Functional Limits  FIM:  FIM - Control and instrumentation engineer Devices: Arm rests Bed/Chair Transfer: 1: Two helpers;2: Supine > Sit: Max A (lifting assist/Pt. 25-49%);2: Bed > Chair or W/C:  Max A (lift and lower assist) (+2 w/c>bed due to medical change) FIM - Locomotion: Wheelchair Distance: 60 Locomotion: Wheelchair: 2: Travels 50 - 149 ft with moderate assistance (Pt: 50 - 74%) FIM - Locomotion: Ambulation Locomotion: Ambulation Assistive Devices: Other (comment) (three muskateer style, only 3') Ambulation/Gait Assistance: 1: +2 Total assist Locomotion: Ambulation: 1: Two helpers FIM - Locomotion: Stairs Locomotion: Stairs: 0: Activity did not occur (not safe at this time)   Refer to Care Plan for Long Term Goals  Recommendations for other services: Neuropsych  Discharge Criteria: Patient will be discharged from PT if patient refuses treatment 3 consecutive times without medical reason, if treatment goals not met, if there is a change in medical status, if patient makes no progress towards goals or if patient is discharged from hospital.  The above assessment, treatment plan, treatment alternatives and goals were discussed and mutually agreed upon: by patient  Denice Bors 05/27/2015, 12:53 PM

## 2015-05-27 NOTE — Progress Notes (Signed)
PT /OT on hold for the rest of the afternoon.Marland Kitchen

## 2015-05-28 ENCOUNTER — Inpatient Hospital Stay (HOSPITAL_COMMUNITY): Payer: BLUE CROSS/BLUE SHIELD | Admitting: Speech Pathology

## 2015-05-28 ENCOUNTER — Inpatient Hospital Stay (HOSPITAL_COMMUNITY): Payer: BLUE CROSS/BLUE SHIELD

## 2015-05-28 ENCOUNTER — Inpatient Hospital Stay (HOSPITAL_COMMUNITY): Payer: BLUE CROSS/BLUE SHIELD | Admitting: Physical Therapy

## 2015-05-28 ENCOUNTER — Inpatient Hospital Stay (HOSPITAL_COMMUNITY): Payer: BLUE CROSS/BLUE SHIELD | Admitting: Occupational Therapy

## 2015-05-28 DIAGNOSIS — I69120 Aphasia following nontraumatic intracerebral hemorrhage: Secondary | ICD-10-CM

## 2015-05-28 DIAGNOSIS — G8111 Spastic hemiplegia affecting right dominant side: Secondary | ICD-10-CM

## 2015-05-28 DIAGNOSIS — F411 Generalized anxiety disorder: Secondary | ICD-10-CM | POA: Insufficient documentation

## 2015-05-28 DIAGNOSIS — G8190 Hemiplegia, unspecified affecting unspecified side: Secondary | ICD-10-CM

## 2015-05-28 DIAGNOSIS — Z8659 Personal history of other mental and behavioral disorders: Secondary | ICD-10-CM | POA: Insufficient documentation

## 2015-05-28 HISTORY — DX: Aphasia following nontraumatic intracerebral hemorrhage: I69.120

## 2015-05-28 HISTORY — DX: Generalized anxiety disorder: F41.1

## 2015-05-28 HISTORY — DX: Spastic hemiplegia affecting right dominant side: G81.11

## 2015-05-28 LAB — PROTHROMBIN GENE MUTATION

## 2015-05-28 LAB — FACTOR 5 LEIDEN

## 2015-05-28 MED ORDER — POTASSIUM CHLORIDE CRYS ER 20 MEQ PO TBCR
30.0000 meq | EXTENDED_RELEASE_TABLET | Freq: Three times a day (TID) | ORAL | Status: DC
  Administered 2015-05-28 – 2015-05-31 (×11): 30 meq via ORAL
  Filled 2015-05-28 (×20): qty 1

## 2015-05-28 MED ORDER — FLUOXETINE HCL 10 MG PO CAPS
10.0000 mg | ORAL_CAPSULE | Freq: Every day | ORAL | Status: DC
  Administered 2015-05-28 – 2015-06-01 (×5): 10 mg via ORAL
  Filled 2015-05-28 (×7): qty 1

## 2015-05-28 NOTE — Progress Notes (Signed)
Physical Therapy Session Note  Patient Details  Name: Ashley [name] MRN: [id number] Date of Birth: [birth year]  Today's Date: 05/28/2015 PT Individual Time: 1440-1510 PT Individual Time Calculation (min): 30 min   Short Term Goals: Week 1:  PT Short Term Goal 1 (Week 1): Pt will perform bed mobility at mod A level with 50% cues to attend to RUE/LE PT Short Term Goal 2 (Week 1): Pt will perform functional transfers to the R and L at mod A level with 50% cues to attend to RUE/LE PT Short Term Goal 3 (Week 1): Pt will perform sit<>stand at max A of single therapist  PT Short Term Goal 4 (Week 1): Pt will propel w/c using L hemi technique at S level x 150'   Skilled Therapeutic Interventions/Progress Updates:    Pt received supine in bed, no c/o pain and agreeable to treatment. Husband states pt c/o headache earlier in the day but did not c/o headache currently. Due to pt lethargy and decreased willingness to participate, session performed in pt room and focused on sitting balance and R attention. Supine>sit on edge of bed with modA. Sitting on edge of bed requires variable min>maxA throughout session. Upon initial sitting, pt demonstrates strong R lateral lean, however upon discussing with pt she states she can tell that posture is incorrect, and upright feels centered. Suggests that pt is oriented to midline however lacks postural control to maintain midline alignment; noted decreased pushing in L extremities over report in previous sessions. LUE reaching tasks performed with husband in front of pt using hand as target. After each reaching attempt pt returns to R lateral trunk lean and requires mod/max cues and assistance to return to midline. Pt requires several cues to keep eyes open due to fatigue and inattention. Pt returned to supine position with minA and assist needed for RLE management. Pt repositioned in bed with husband assisting and totalA +2. Replaced w/c due to broken leg rests, educated pt  and husband on use of new chair and arm tray table for RUE while seated in w/c. Pt left supine in bed with alarm intact and all needs within reach at completion of session.   Therapy Documentation Precautions:  Precautions Precautions: Fall Precaution Comments: RUE flexortone Restrictions Weight Bearing Restrictions: No Pain: Pain Assessment Pain Assessment: No/denies pain Pain Score: 0-No pain Locomotion : Wheelchair Mobility Distance: 25   See FIM for current functional status  Therapy/Group: Individual Therapy  Luberta Mutter 05/28/2015, 3:30 PM

## 2015-05-28 NOTE — Progress Notes (Signed)
Physical Therapy Session Note  Patient Details  Name: Ashley [name] MRN: [id number] Date of Birth: [birth year]  Today's Date: 05/28/2015 PT Individual Time: 0800-0900 PT Individual Time Calculation (min): 60 min   Short Term Goals: Week 1:  PT Short Term Goal 1 (Week 1): Pt will perform bed mobility at mod A level with 50% cues to attend to RUE/LE PT Short Term Goal 2 (Week 1): Pt will perform functional transfers to the R and L at mod A level with 50% cues to attend to RUE/LE PT Short Term Goal 3 (Week 1): Pt will perform sit<>stand at max A of single therapist  PT Short Term Goal 4 (Week 1): Pt will propel w/c using L hemi technique at S level x 150'   Skilled Therapeutic Interventions/Progress Updates:   Pt extremely difficult to arouse.  Eventually she participated in RLE AROM  In supine: hip, knee ankle, 10 x 1 each straight leg raises, short arc quad knee ext, ankle pumps, hip abd/add ; PROM R hip, knee ankle.  Hypertonus noted RLE hamstrings and quad, heel cords.  MD arrived and pt woke up and was verbal with him.  Rolling>R and sitting with mod assist.  Mod assist for static sitting EOb x 1 minute, with frequent repositining of RUE due to pushing.  Squat pivot to R, pulling on armrest of w/c with L hand, max assist.  W/c propulsion using hemi method x 20' with intermittent min assist for steering and R inattention.  Pt left with husband, sitting up in w/c.     Therapy Documentation Precautions:  Precautions Precautions: Fall Precaution Comments: RUE flexortone Restrictions Weight Bearing Restrictions: No   Pain: Pain Assessment Pain Assessment: No/denies pain   Locomotion : Wheelchair Mobility Distance: 25     See FIM for current functional status  Therapy/Group: Individual Therapy  Jaslin Novitski 05/28/2015, 12:34 PM

## 2015-05-28 NOTE — H&P (Signed)
Physical Medicine and Rehabilitation Admission H&P   Chief Complaint  Patient presents with  . Right facial droop with aphasia, right sided weakness    HPI: Ashley [name] is a 39 y.o. female two weeks post-partum with complaints of HA on and off since delivery. She was admitted via [facility] with severe HA and developed right facial droop with aphasia and right sided weakness after admission. She was transferred to [facility] and developed right focal seizure en route treated with ativan. MRI/MRA brain done revealing acute large LEFT parietal intraparenchymal hematoma dissecting along the corpus callosum with intraventricular extension, scattered SAH with findings suggestive of postpartum angiopathy, no obstructive hydrocephalus and no venous thrombosis. MRV without dural venous sinus thrombosis. EEG showed evidence of left sided (periodic lateral epileptiform discharges)PLEDs and patient was loaded with Keppra and started on Keppra 750 mg bid. ICP treated with hypertonic saline and induced hypernatremia. Follow up CCT showed large left fronto-parietal hematoma with progression of vasogenic edema and extensive SAH left hemisphere. She was started on hypertonic saline to help with edema control and CCT 08/12 shows stable large left posterior frontoparietal lobe hematoma with mild surrounding vasogenic edema, 4 mm of left-to-right midline shift and no hydrocephalus.   Dr. Leonie Man did not feel anticoagulation needed in absence of definite cerebral venous sinus/cortical vein thrombosis. Therapy ongoing and patient showing improvement in activity tolerance. ST following for swallow evaluation and patient advanced to Dysphagia 3, thin liquids. Hypokalemia with K+ 2.2 on 08/12 being supplemented. Patient with dense left hemiplegia, aphasia as well as dysphagia. CIR recommended by MD and rehab team     Review of Systems  Unable to perform ROS: language      Past Medical History  Diagnosis Date  .  Anxiety     Past Surgical History  Procedure Laterality Date  . Dilation and curettage of uterus      Family History  Problem Relation Age of Onset  . Cancer Mother   . Cancer Father     Social History: Married. Has 58 week old and 2 step children at home. Per reports working and active PTA. reports that she has never smoked. She has never used smokeless tobacco. She reports that she drinks alcohol. She reports that she does not use illicit drugs.    Allergies: No Known Allergies    Medications Prior to Admission  Medication Sig Dispense Refill  . acetaminophen (TYLENOL) 500 MG tablet Take 1,000 mg by mouth every 6 (six) hours as needed for headache.    Marland Kitchen acyclovir (ZOVIRAX) 400 MG tablet Take 400 mg by mouth 5 (five) times daily as needed (for breakout).     Marland Kitchen ampicillin (PRINCIPEN) 500 MG capsule Take 500 mg by mouth 2 (two) times daily as needed.  5  . aspirin-acetaminophen-caffeine (EXCEDRIN MIGRAINE) 916-384-66 MG per tablet Take 1-2 tablets by mouth every 6 (six) hours as needed for headache or migraine.    . docusate sodium (COLACE) 100 MG capsule Take 100 mg by mouth daily.    Marland Kitchen FLUoxetine (PROZAC) 40 MG capsule Take 40 mg by mouth daily.    Marland Kitchen ibuprofen (ADVIL,MOTRIN) 600 MG tablet Take 1 tablet (600 mg total) by mouth every 6 (six) hours as needed for moderate pain or cramping. 40 tablet 0  . Prenatal Vit-Fe Fumarate-FA (PRENATAL MULTIVITAMIN) TABS tablet Take 1 tablet by mouth daily at 12 noon.      Home: Home Living Family/patient expects to be discharged to:: Inpatient rehab Living Arrangements: Spouse/significant other, Children Available  Help at Discharge: Family, Available 24 hours/day Type of Home: House Home Access: Stairs to enter [employer] of Steps: 3-4 in front and one via garage Home Layout: Two level, Bed/bath upstairs Alternate Level Stairs-Number of Steps: full flight   Bathroom Toilet: Standard Home Equipment: None Additional Comments: Pt lives with spouse, and 25 week old son. She shares custody of children ages 18 and 62. 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 She runs frequently   Functional Status:  Mobility: Bed Mobility Overal bed mobility: Needs Assistance Bed Mobility: Supine to Sit Supine to sit: Mod assist General bed mobility comments: rolling to left with assist for right LE and assist to llift trunk upright Transfers Overall transfer level: Needs assistance Equipment used: 2 person hand held assist Transfers: Sit to/from Stand, Stand Pivot Transfers Sit to Stand: Mod assist, +2 physical assistance, Max assist Stand pivot transfers: Mod assist, +2 physical assistance Squat pivot transfers: Max assist, +2 physical assistance General transfer comment: standing with assist for right LE blocking knee and hip and assisting with upper trunk extension at axilla; assist for moving right leg with pivotal steps to chair. Ambulation/Gait General Gait Details: not tested due to HA in sitting and pt fearful due to right side weakness    ADL: ADL Overall ADL's : Needs assistance/impaired Eating/Feeding: Maximal assistance, Sitting Grooming: Wash/dry hands, Wash/dry face, Brushing hair, Maximal assistance, Moderate assistance Grooming Details (indicate cue type and reason): Pt able to identify "comb". but unable to demonstrate use initially. With verbal cues and gestures, she was able to demonstrate use, but demonstrated signfiicant difficulty positioning comb appropriately in relation to hair/head requiring hand over hand assist to complete task  Upper Body Bathing: Maximal assistance, Sitting Lower Body  Bathing: Total assistance, Sit to/from stand Upper Body Dressing : Total assistance, Sitting Lower Body Dressing: Total assistance, Sit to/from stand, Sitting/lateral leans Toilet Transfer: Maximal assistance, +2 for safety/equipment, Squat-pivot, BSC Toileting- Clothing Manipulation and Hygiene: Total assistance, Sitting/lateral lean Functional mobility during ADLs: Maximal assistance, +2 for physical assistance   Cognition: Cognition Overall Cognitive Status: Impaired/Different from baseline Arousal/Alertness: Awake/alert Orientation Level: Oriented to person Cognition Arousal/Alertness: Awake/alert Behavior During Therapy: Flat affect Overall Cognitive Status: Impaired/Different from baseline Area of Impairment: Attention, Following commands Current Attention Level: Sustained Following Commands: Follows one step commands with increased time General Comments: Pt follows one step commands inconsitently. She appeared to recognize infant son, and interacted appropriately with him within confines of physical and communication impairments  Difficult to assess due to: Impaired communication    Blood pressure 155/70, pulse 64, temperature 99.3 F (37.4 C), temperature source Oral, resp. rate 18, height $RemoveBe'5\' 3"'[id number]$  (1.6 m), weight 66.6 kg (146 lb 13.2 oz), SpO2 96 %, unknown if currently breastfeeding. Physical Exam  Nursing note and vitals reviewed. Constitutional: She appears well-developed and well-nourished. She appears lethargic.  Arouses to stimulation.  HENT:  Head: Normocephalic and atraumatic.  Eyes: Conjunctivae are normal. Pupils are equal, round, and reactive to light.  Neck: Normal range of motion. Neck supple.  Cardiovascular: Normal rate and regular rhythm.  Respiratory: Effort normal and breath sounds normal. No respiratory distress. She has no wheezes.  GI: Soft. Bowel sounds are normal. She exhibits no distension. There is no tenderness.  Musculoskeletal: She exhibits  no edema or tenderness.  Neurological: She appears lethargic.   Aphasia- receptive and expressive Right inattention appears to be improvning  Withdraws to pinch RLE but not RUE. Extensor tone RLE.  Motor strength is 0/5 in the right deltoid, biceps, triceps, grip, hip flexor, knee extensor, ankle dorsi flexor and plantar flexor. Left side has at least 4/5 strength.  Skin: Skin is warm and dry.     Lab Results Last 48 Hours    Results for orders placed or performed during the hospital encounter of 05/20/15 (from the past 48 hour(s))  Sodium Status: Abnormal   Collection Time: 05/23/15 2:48 PM  Result Value Ref Range   Sodium 154 (H) 135 - 145 mmol/L  Sodium Status: Abnormal   Collection Time: 05/23/15 9:15 PM  Result Value Ref Range   Sodium 156 (H) 135 - 145 mmol/L  Sodium Status: Abnormal   Collection Time: 05/24/15 3:17 AM  Result Value Ref Range   Sodium 155 (H) 135 - 145 mmol/L  Basic metabolic panel Status: Abnormal   Collection Time: 05/25/15 6:44 AM  Result Value Ref Range   Sodium 141 135 - 145 mmol/L    Comment: DELTA CHECK NOTED   Potassium 2.2 (LL) 3.5 - 5.1 mmol/L    Comment: CRITICAL RESULT CALLED TO, READ BACK BY AND VERIFIED WITH: THOMPSON A RN 05/25/15 0721 COSTELLO B    Chloride 105 101 - 111 mmol/L   CO2 26 22 - 32 mmol/L   Glucose, Bld 128 (H) 65 - 99 mg/dL   BUN 12 6 - 20 mg/dL   Creatinine, Ser 0.54 0.44 - 1.00 mg/dL   Calcium 8.4 (L) 8.9 - 10.3 mg/dL   GFR calc non Af Amer >60 >60 mL/min   GFR calc Af Amer >60 >60 mL/min    Comment: (NOTE) The eGFR has been calculated using the CKD EPI equation. This calculation has not been validated in all clinical situations. eGFR's persistently <60 mL/min signify possible Chronic Kidney Disease.    Anion gap 10 5 - 15       Imaging Results (Last 48 hours)    Ct Head Wo  Contrast  05/25/2015 CLINICAL DATA: Intracranial hemorrhage follow-up EXAM: CT HEAD WITHOUT CONTRAST TECHNIQUE: Contiguous axial images were obtained from the base of the skull through the vertex without intravenous contrast. COMPARISON: 05/23/2015 FINDINGS: Stable, large left posterior frontoparietal lobe hematoma with extension into the corpus callosum similar in size compared with the prior examination with mild surrounding on the lateral ventricle vasogenic edema. There is mild downward mass effect on the lateral ventricle left greater than right. There is 4 mm of left-to-right midline shift. There is small volume left hemispheric subarachnoid hemorrhage which has improved compared with 05/23/2015. There is stable small volume intraventricular hemorrhage dependently within the occipital horns. There is no other foci of hemorrhage. The paranasal sinuses are clear. The mastoid sinuses are clear the calvarium is intact. The visualized orbits are unremarkable. IMPRESSION: 1. Stable, large left posterior frontoparietal lobe hematoma extending into the corpus callosum with mild surrounding vasogenic edema. 4 mm of left-to-right midline shift. No hydrocephalus. 2. Small volume left hemispheric subarachnoid hemorrhage which has improved compared with 05/23/2015. 3. Stable small volume intraventricular hemorrhage dependently within the occipital horns. Electronically Signed By: Kathreen Devoid On: 05/25/2015 08:11       Medical Problem List and Plan: 1. Functional deficits secondary to Right flaccid hemiplegia,dysphagia, and  aphasia secondary to large left parietal intracranial hemorrhage 2. DVT Prophylaxis/Anticoagulation: Pharmaceutical: Lovenox 3.  Pain Management: Tylenol prn 4. Mood: LCSW to follow for evaluation and support as cognition/aphasia improves.  5. Neuropsych: This patient is not capable of making decisions on her own behalf. 6. Skin/Wound Care: Routine pressure relief  measures.  7. Fluids/Electrolytes/Nutrition: Monitor I/O. Check lytes in am 8. Insomnia?: Will order sleep/wake chart. 9. New onset seizures: On keppra bid. 10. HTN: Monitor BP every 8 hours. Being treated prn.      Post Admission Physician Evaluation: 1. Functional deficits secondary to Right flaccid hemiplegia secondary to large left parietal intracranial hemorrhage 2. Patient is admitted to receive collaborative, interdisciplinary care between the physiatrist, rehab nursing staff, and therapy team. 3. Patient's level of medical complexity and substantial therapy needs in context of that medical necessity cannot be provided at a lesser intensity of care such as a SNF. 4. Patient has experienced substantial functional loss from his/her baseline which was documented above under the "Functional History" and "Functional Status" headings. Judging by the patient's diagnosis, physical exam, and functional history, the patient has potential for functional progress which will result in measurable gains while on inpatient rehab. These gains will be of substantial and practical use upon discharge in facilitating mobility and self-care at the household level. 5. Physiatrist will provide 24 hour management of medical needs as well as oversight of the therapy plan/treatment and provide guidance as appropriate regarding the interaction of the two. 6. 24 hour rehab nursing will assist with bladder management, bowel management, safety, skin/wound care, disease management, medication administration, pain management and patient education and help integrate therapy concepts, techniques,education, etc. 7. PT will assess and treat for/with: pre gait, gait training, endurance , safety, equipment, neuromuscular re education. Goals are: min A. 8. OT will assess and treat for/with: ADLs, Cognitive perceptual skills, Neuromuscular re education, safety, endurance, equipment. Goals are: min A. Therapy may proceed  with showering this patient. 9. SLP will assess and treat for/with: aphasia,dysphagia. Goals are: express basic needs, safe and adequate po fluid and caloric intake. 10. Case Management and Social Worker will assess and treat for psychological issues and discharge planning. 11. Team conference will be held weekly to assess progress toward goals and to determine barriers to discharge. 12. Patient will receive at least 3 hours of therapy per day at least 5 days per week. 13. ELOS: 22-25d  14. Prognosis: good     [name] M.D. [facility] FAAPM&R (Sports Med, Neuromuscular Med) Diplomate Am Board of Electrodiagnostic Med  05/28/2015

## 2015-05-28 NOTE — Progress Notes (Signed)
Social Work Patient ID: Temple Pacini, female   DOB: 1976/02/20, 39 y.o.   MRN: [id number]   CSW met with pt and her sister-in-law, Benjie Karvonen, to introduce self and CSW role, as well as to complete assessment.  Pt had just taken medication and was having some head pain, so CSW told her we could visit again later.  Pt wanted CSW to stay so that she could take her mind off of the pain.  CSW tried to chat with pt informally, but pt had difficulty getting her points across to [place].  She often times knew when she was not conveying her thoughts correctly, but would not get frustrated, she would "chuckle" about it.  She welcomed CSW to visit anytime and was appreciative of visit.  Benjie Karvonen said that pt would have lots of support and people to take care of her and the children.  [name], who has a home in [place], has moved in with the family and is taking care of the 2 week old baby at home.  Pt's ex-husband is helping with older two children as are other family members and friends.  CSW to complete assessment further when pt's husband is present.

## 2015-05-28 NOTE — Evaluation (Signed)
Speech Language Pathology Assessment and Plan  Patient Details  Name: Ashley [name] MRN: [id number] Date of Birth: 09-17-1976  SLP Diagnosis: Aphasia;Cognitive Impairments;Dysphagia  Rehab Potential: Good ELOS: 14-21 days     Today's Date: 05/28/2015 SLP Individual Time: 0905-1005 SLP Individual Time Calculation (min): 60 min   Problem List:  Patient Active Problem List   Diagnosis Date Noted  . Right spastic hemiparesis 05/28/2015  . 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 History of anxiety disorder 05/28/2015  . ICH (intracerebral hemorrhage)   . Seizure disorder, nonconvulsive, with status epilepticus   . Cerebral venous thrombosis of cortical vein   . Cytotoxic cerebral edema   . IVH (intraventricular hemorrhage)   . Term pregnancy 05/07/2015  . Spontaneous vaginal delivery 05/07/2015   Past Medical History:  Past Medical History  Diagnosis Date  . Anxiety    Past Surgical History:  Past Surgical History  Procedure Laterality Date  . Dilation and curettage of uterus      Assessment / Plan / Recommendation Clinical Impression  Ashley [name] is a 39 y.o. female two weeks post-partum with complaints of HA on and off since delivery. She was admitted via [facility] with severe HA and developed right facial droop with aphasia and right sided weakness after admission. She was transferred to [facility] and developed right focal seizure en route treated with ativan. MRI/MRA brain done revealing acute large LEFT parietal intraparenchymal hematoma dissecting along the corpus callosum with intraventricular extension, scattered SAH with findings suggestive of postpartum angiopathy, no obstructive hydrocephalus and no venous thrombosis. Patient advanced to Dysphagia 3, thin liquids. Pt admitted to CIR on 05/26/2015.  SLP evaluation completed on 05/28/2015 with the following results: Pt presents with s/s of as mild oral dysphagia with motor, sensory, and cognitive  components.  Pt demonstrated left sided labial and lingual weakness as well as decreased attention to boluses and impulsivity with rate and portion control.  Pt was able to clear mixed consistencies of dys 3 solids and thin liquids from the oral cavity with cues for slow rate and small bites/sips.  Mild right anterior loss of materials was noted due to decreased labial seal, for which pt required cues to monitor and correct.  No overt s/s of aspiration were evident with solids or liquids.  Recommend that pt remain on dys 3 textures and thin liquids with full supervision for use of swallowing precautions.   Pt also presents with a moderately severe expressive>receptive aphasia.  Her verbal output is characterized by frequent perseverations, neologistic jargon, and poor awareness of verbal errors.  She also demonstrates inconsistent yes/no accuracy with basic to semi-complex information and is intermittently able to follow 2-step commands with visual and gestural cues.  Informally, pt presents with right inattention, decreased sustained attention to tasks, decreased safety awareness with impulsivity, decreased intellectual awareness of deficits, and decreased functional problem solving.  Pt was independent prior to admission and the abovementioned deficits impact her ability to complete familiar self care and/or home management tasks independently.  As a result, pt would benefit from skilled ST while inpatient in order to maximize functional independence and reduce burden of care prior to discharge.  Anticipate that pt will need extensive 24/7 supervision and assistance with medication and financial management in addition to ST follow up at next level of care.    Skilled Therapeutic Interventions          Cognitive-linguistic and bedside swallow evaluation completed with results and recommendations reviewed with patient and family.  [name] was present during today's evaluation.  SLP explained rationale behind  pt's currently recommended diet and demonstrated techniques to maximize pt's functional independence and swallowing safety during meals.  SLP also reiterated that pt is not to be left with foods or liquids within her reach when she is alone due to full supervision recommendations.  [name] verbalized understanding of precautions and is now signed off to supervise pt during meals.     SLP Assessment  Patient will need skilled Speech Lanaguage Pathology Services during CIR admission    Recommendations  SLP Diet Recommendations: Dysphagia 3 (Mech soft);Thin Liquid Administration via: Cup;Straw Medication Administration: Whole meds with puree Supervision: Staff to assist with self feeding;Full supervision/cueing for compensatory strategies Compensations: Slow rate;Small sips/bites;Check for pocketing Postural Changes and/or Swallow Maneuvers: Seated upright 90 degrees;Out of bed for meals Oral Care Recommendations: Oral care BID Patient destination: Home Follow up Recommendations: 24 hour supervision/assistance;Home Health SLP;Outpatient SLP Equipment Recommended: None recommended by SLP    SLP Frequency 3 to 5 out of 7 days   SLP Treatment/Interventions Cognitive remediation/compensation;Cueing hierarchy;Dysphagia/aspiration precaution training;Internal/external aids;Environmental controls;Functional tasks;Speech/Language facilitation;Patient/family education;Multimodal communication approach    Pain Pain Assessment Pain Assessment: No/denies pain Prior Functioning Cognitive/Linguistic Baseline: Within functional limits Type of Home: House  Lives With: Spouse Available Help at Discharge: Family;Available 24 hours/day Vocation: Part time employment  Short Term Goals: Week 1: SLP Short Term Goal 1 (Week 1): Pt will consume her currently prescribed diet with min verbal cues for use of swallowing precautions.   SLP Short Term Goal 2 (Week 1): Pt will consume trials of advanced  consistencies with min verbal cues for use of swallowing precautions over 2 targeted sessions prior to diet advancement.   SLP Short Term Goal 3 (Week 1): Pt will monitor and correct verbosity and/or verbal errors with max assist multimodal cues during functional communication with SLP.   SLP Short Term Goal 4 (Week 1): Pt will name basic, familiar objects during structured tasks for >50% accuracy with max assist multimodal cues.   SLP Short Term Goal 5 (Week 1): Pt will follow 2-step commands during a functional task wtih mod assist multimodal cues.  SLP Short Term Goal 6 (Week 1): Pt will sustain attention to a functional task for ~5 minute intervals with mod verbal and visual cues for redirection.    See FIM for current functional status Refer to Care Plan for Long Term Goals  Recommendations for other services: None  Discharge Criteria: Patient will be discharged from SLP if patient refuses treatment 3 consecutive times without medical reason, if treatment goals not met, if there is a change in medical status, if patient makes no progress towards goals or if patient is discharged from hospital.  The above assessment, treatment plan, treatment alternatives and goals were discussed and mutually agreed upon: by patient and by family  Emilio Math 05/28/2015, 12:59 PM

## 2015-05-28 NOTE — Progress Notes (Signed)
Patient information reviewed and entered into eRehab system by Albert Hersch, RN, CRRN, PPS Coordinator.  Information including medical coding and functional independence measure will be reviewed and updated through discharge.    

## 2015-05-28 NOTE — Progress Notes (Signed)
Gerlean Ren Rehab Admission Coordinator Signed Physical Medicine and Rehabilitation PMR Pre-admission 05/25/2015 3:34 PM  Related encounter: ED to Hosp-Admission (Discharged) from 05/20/2015 in [place] Collapse All   PMR Admission Coordinator Pre-Admission Assessment  Patient: Ashley [name] is an 39 y.o., female MRN: [id number] DOB: 09-14-1976 Height: 5\' 3"  (160 cm) Weight: 66.6 kg (146 lb 13.2 oz)  Insurance Information HMO: PPO: PCP: IPA: 80/20: OTHER: Blue Advantage Silver PRIMARY: BC BS Policy#: [id number] Subscriber: self CM Name: Harrie Jeans Phone#: [phone] Fax#: [phone] Pre-Cert#: [id number], authorized for IP rehab through 06/07/15, update due to chuck on 06/06/15 Employer: [employer] Benefits: Phone #: online Name:  Eff. Date: 10/13/14 Deduct: $3500 Out of Pocket Max: [phone] Life Max: none CIR: 70%/30% SNF: 70%/30% Outpatient: 70%/30% Co-Pay: $25 copay/ 30 visits max Home Health: 70% Co-Pay: 30% DME: 70% Co-Pay: 30% Providers: In network SECONDARY: Policy#: Subscriber: CM Name: Phone#: Fax#:  Pre-Cert#: Employer:  Benefits: Phone #: Name:  Eff. 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 female two weeks post-partum with complaints of HA on and off since delivery. She was admitted via [facility] with severe HA and developed right facial droop with aphasia and right sided weakness after admission. She was transferred to [facility] and developed right focal seizure en route treated with ativan. MRI/MRA brain done revealing acute large LEFT parietal intraparenchymal hematoma dissecting along the corpus callosum with intraventricular extension, scattered SAH with findings suggestive of postpartum angiopathy, no obstructive hydrocephalus and no venous thrombosis. MRV without dural venous sinus thrombosis. EEG showed evidence of left sided (periodic lateral epileptiform discharges)PLEDs and patient was loaded with Keppra and started on Keppra 750 mg bid. ICP treated with hypertonic saline and induced hypernatremia. Follow up CCT showed large left fronto-parietal hematoma with progression of vasogenic edema and extensive SAH left hemisphere. She was started on hypertonic saline to help with edema control and CCT 08/12 shows stable large left posterior frontoparietal lobe hematoma with mild surrounding vasogenic edema, 4 mm of left-to-right midline shift and no hydrocephalus. Dr. Leonie Man does not feel anticoagulation needed in absence of definite cerebral venous sinus/cortical vein thrombosis.   Therapy ongoing and patient showing improvement in activity tolerance. Swallow evaluation done and patient started on dysphagia 3, nectar liquids. Hypokalemia with K+ 2.2 on 08/12 being supplemented. Patient with dense left hemiplegia, aphasia as well as dysphagia. CIR recommended by MD and rehab team and patient cleared to admit 05/26/15 Total: 19 [facility]    Past Medical History  Past Medical History  Diagnosis Date  . Anxiety     Family History  family history includes Cancer in her father and  mother.  Prior Rehab/Hospitalizations:  Has the patient had major surgery during 100 days prior to admission? No; pt. Delivered baby ~2 1/2 weeks ago  Current Medications   Current facility-administered medications:  . acetaminophen (TYLENOL) tablet 1,000 mg, 1,000 mg, Oral, Q6H PRN, Garvin Fila, MD . acetaminophen (TYLENOL) tablet 650 mg, 650 mg, Oral, Q4H PRN, 650 mg  at 05/24/15 0036 **OR** [DISCONTINUED] acetaminophen (TYLENOL) suppository 650 mg, 650 mg, Rectal, Q4H PRN, Alexis Goodell, MD, 650 mg at 05/23/15 1154 . enoxaparin (LOVENOX) injection 40 mg, 40 mg, Subcutaneous, Daily, Garvin Fila, MD, 40 mg at 05/25/15 0830 . hydrALAZINE (APRESOLINE) injection 5 mg, 5 mg, Intravenous, Once, Walidah N Karim, CNM, 5 mg at 05/21/15 0217 . labetalol (NORMODYNE,TRANDATE) injection 10-40 mg, 10-40 mg, Intravenous, Q10 min PRN, Alexis Goodell, MD . levETIRAcetam (KEPPRA) 750 mg in sodium chloride 0.9 % 100 mL IVPB, 750 mg, Intravenous, Q12H, Drema Dallas, DO, 750 mg at 05/25/15 0830 . LORazepam (ATIVAN) injection 1 mg, 1 mg, Intravenous, Q1H PRN, Alexis Goodell, MD, 1 mg at 05/21/15 0554 . ondansetron (ZOFRAN) injection 4 mg, 4 mg, Intravenous, Q6H PRN, Garvin Fila, MD, 4 mg at 05/24/15 0048 . pantoprazole (PROTONIX) injection 40 mg, 40 mg, Intravenous, QHS, Alexis Goodell, MD, 40 mg at 05/24/15 2157 . potassium chloride (K-DUR,KLOR-CON) CR tablet 30 mEq, 30 mEq, Oral, Once, Garvin Fila, MD . potassium chloride SA (K-DUR,KLOR-CON) CR tablet 20 mEq, 20 mEq, Oral, Once, Garvin Fila, MD . potassium chloride SA (K-DUR,KLOR-CON) CR tablet 20 mEq, 20 mEq, Oral, Once, Garvin Fila, MD . Derrill Memo ON 05/26/2015] potassium chloride SA (K-DUR,KLOR-CON) CR tablet 20 mEq, 20 mEq, Oral, BID, Garvin Fila, MD . prenatal multivitamin tablet 1 tablet, 1 tablet, Oral, Q1200, Garvin Fila, MD, 1 tablet at 05/25/15 1342 . senna-docusate (Senokot-S) tablet 1 tablet, 1 tablet, Oral,  BID, Alexis Goodell, MD, 1 tablet at 05/25/15 1342  Patients Current Diet: DIET DYS 3 Room service appropriate?: Yes; Fluid consistency:: Nectar Thick  Precautions / Restrictions Precautions Precautions: Fall Precaution Comments: right extensor tone Restrictions Weight Bearing Restrictions: No   Has the patient had 2 or more falls or a fall with injury in the past year?No  Prior Activity Level Community (5-7x/wk): In addition to a two week old son, pt. is the mother to a 89 and 74 year old from her first marriage. Her husband states pt. is very acitve and involved in her children. She has taken the summer off from her job and was enjoying getting her children to the pool. She also was slow running 4 miles on a treadmill as recently as 3 days prior the delivery of the baby  Development worker, international aid / [place] Devices/Equipment: None Home Equipment: None  Prior Device Use: Indicate devices/aids used by the patient prior to current illness, exacerbation or injury? None of the above Pt. 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 She runs frequently   Self Care: Did the patient need help bathing, dressing, using the toilet or eating? Independent  Indoor Mobility: Did the patient need assistance with walking from room to room (with or without device)? Independent  Stairs: Did the patient need assistance with internal or external stairs (with or without device)? Independent  Functional Cognition: Did the patient need help planning regular tasks such as shopping or remembering to take medications? Independent  Current Functional Level Cognition  Arousal/Alertness: Awake/alert Overall Cognitive Status: Impaired/Different from baseline Difficult to assess due to: Impaired communication Current Attention Level:  Sustained Orientation Level: Oriented to person Following Commands: Follows one step commands with increased time General Comments: Pt follows one step commands inconsitently. She appeared to recognize infant son, and interacted appropriately with him  within confines of physical and communication impairments    Extremity Assessment (includes Sensation/Coordination)  Upper Extremity Assessment: RUE deficits/detail RUE Deficits / Details: Pt demonstrates full PROM, but extensor spasticity present. Pt does seem to be able to inhibit spasticity when asked to perform a movement with her arm, was able to passively guide her with no increased spasticity  RUE Coordination: decreased fine motor, decreased gross motor  Lower Extremity Assessment: Defer to PT evaluation RLE Deficits / Details: excessive PF tone with continuous clonus with stretch to achilles, unable to move past about 30 degrees plantarflexion even with knee flexed, increased tone throughout rest of leg, but less than at ankle, no voluntary movement noted    ADLs  Overall ADL's : Needs assistance/impaired Eating/Feeding: Maximal assistance, Sitting Grooming: Wash/dry hands, Wash/dry face, Brushing hair, Maximal assistance, Moderate assistance Grooming Details (indicate cue type and reason): Pt able to identify "comb". but unable to demonstrate use initially. With verbal cues and gestures, she was able to demonstrate use, but demonstrated signfiicant difficulty positioning comb appropriately in relation to hair/head requiring hand over hand assist to complete task  Upper Body Bathing: Maximal assistance, Sitting Lower Body Bathing: Total assistance, Sit to/from stand Upper Body Dressing : Total assistance, Sitting Lower Body Dressing: Total assistance, Sit to/from stand, Sitting/lateral leans Toilet Transfer: Maximal assistance, +2 for safety/equipment, Squat-pivot, BSC Toileting- Clothing Manipulation and Hygiene: Total  assistance, Sitting/lateral lean Functional mobility during ADLs: Maximal assistance, +2 for physical assistance    Mobility  Overal bed mobility: Needs Assistance Bed Mobility: Supine to Sit Supine to sit: Mod assist General bed mobility comments: rolling to left with assist for right LE and assist to llift trunk upright    Transfers  Overall transfer level: Needs assistance Equipment used: 2 person hand held assist Transfers: Sit to/from Stand, Stand Pivot Transfers Sit to Stand: Mod assist, +2 physical assistance, Max assist Stand pivot transfers: Mod assist, +2 physical assistance Squat pivot transfers: Max assist, +2 physical assistance General transfer comment: standing with assist for right LE blocking knee and hip and assisting with upper trunk extension at axilla; assist for moving right leg with pivotal steps to chair.    Ambulation / Gait / Stairs / Wheelchair Mobility  Ambulation/Gait General Gait Details: not tested due to HA in sitting and pt fearful due to right side weakness    Posture / Balance Dynamic Sitting Balance Sitting balance - Comments: min A to supervision cues for anterior weight shift sitting about 4 minutes while getting hair combed Balance Overall balance assessment: Needs assistance Sitting balance-Leahy Scale: Poor Sitting balance - Comments: min A to supervision cues for anterior weight shift sitting about 4 minutes while getting hair combed Postural control: Posterior lean Standing balance support: Bilateral upper extremity supported Standing balance-Leahy Scale: Poor Standing balance comment: stood about 45 sec with mod assist bilateral UE support and blocking right knee and hip cues for static standing due to moving left LE forward.    Special needs/care consideration BiPAP/CPAP no CPM no Continuous Drip IV no Dialysis no  Life Vest no Oxygen no Special Bed no Skin ecchymosis Location  L arm Bowel mgmt: 05/21/15 per chart Bladder mgmt: urinary catheter Diabetic mgmt no     Previous Home Environment Living Arrangements: Spouse/significant other, Children Lives With: Family Available Help at Discharge: Family, Available 24 hours/day Type of Home: House Home Layout: Two level, Bed/bath upstairs Alternate Level Stairs-Number of Steps: full flight  Home Access: Stairs to enter [employer] of Steps:  3-4 in front and one via garage Bathroom Toilet: [place]: No Additional Comments: Pt lives with spouse, and 81 week old son. She shares custody of children ages 4 and 71. Pt's parents and in-laws are retired and able to provide assist as are her sisters in laws   Discharge Living Setting Plans for Discharge Living Setting: Patient's home Type of Home at Discharge: House Discharge Home Layout: Two level, Bed/bath upstairs Discharge Home Access: Stairs to enter Entrance Stairs-Rails: None Entrance Stairs-Number of Steps: 1-2 at garage entrance Discharge Bathroom Shower/Tub: Tub/shower unit Discharge Bathroom Toilet: Standard Discharge Bathroom Accessibility: Yes How Accessible: Accessible via walker Does the patient have any problems obtaining your medications?: No  Social/Family/Support Systems Patient Roles: Spouse, Parent, Other (Comment) (has 35 week old baby and 23 and 49 year old) Anticipated Caregiver: husband, [name] Willits and pt's mom Jhonnie Garner Anticipated Caregiver's Contact Information: [name] Melichar, spouse [phone] and pt's mom Jhonnie Garner [phone] Ability/Limitations of Caregiver: husband is self employed with 15 employees. He states he could take a month off if he needed to. Also, pt's mom moved in to their home several months ago and plans to stay indefinitely. She is assisting in caring for the baby . Pt' has joint custody of her other 2 children, ages 76 and 20 Caregiver Availability: 24/7 Discharge Plan  Discussed with Primary Caregiver: Yes Is Caregiver In Agreement with Plan?: Yes Does Caregiver/Family have Issues with Lodging/Transportation while Pt is in Rehab?: No   Goals/Additional Needs Patient/Family Goal for Rehab: min assist PT/OT/SLP Expected length of stay: 20-25 days Cultural Considerations: none Dietary Needs: D3 nectar thick liquids Equipment Needs: TBA Special Service Needs: pt.is lactating Pt/Family Agrees to Admission and willing to participate: Yes Program Orientation Provided & Reviewed with Pt/Caregiver Including Roles & Responsibilities: Yes   Decrease burden of Care through IP rehab admission: no   Possible need for SNF placement upon discharge: Not anticipated   Patient Condition: This patient's medical and functional status has changed since the consult dated: 05/23/15 in which the Rehabilitation Physician determined and documented that the patient's condition is appropriate for intensive rehabilitative care in an inpatient rehabilitation facility. See "History of Present Illness" (above) for medical update. Functional changes are: pt. Remains severly perseverative with ongoing aphasia, is on dysphagia 3 diet with nectar thick liquids. Patient's medical and functional status update has been discussed with the Rehabilitation physician and patient remains appropriate for inpatient rehabilitation. Will admit to inpatient rehab tomorrow, Saturday. 05/26/15  Preadmission Screen Completed By: Gerlean Ren, 05/25/2015 4:01 PM ______________________________________________________________________  Discussed status with Dr. Letta Pate on 05/25/15 at 40 and received telephone approval for admission tomorrow, Saturday 05/26/15  Admission Coordinator: Gerlean Ren, time 1600 /Date 05/25/15          Cosigned by: [name], MD at 05/25/2015 4:13 PM  Revision History

## 2015-05-28 NOTE — Progress Notes (Signed)
Ashley Blake, MD Physician Signed Physical Medicine and Rehabilitation Consult Note 05/22/2015 2:47 PM  Related encounter: ED to Hosp-Admission (Discharged) from 05/20/2015 in [place] Collapse All        Physical Medicine and Rehabilitation Consult  Reason for Consult: Right facial droop with aphasia, right sided weakness,  Referring Physician: Dr. Leonie Man.    HPI: Ashley [name] is a 39 y.o. female two weeks post-partum with complaints of HA on and off since delivery. She was admitted via [facility] with severe HA and developed right facial droop with aphasia and right sided weakness after admission. She was transferred to [facility] and developed right focal seizure en route treated with ativan. MRI/MRA brain done revealing acute large LEFT parietal intraparenchymal hematoma dissecting along the corpus callosum with intraventricular extension, scattered SAH with findings suggestive of postpartum angiopathy, no obstructive hydrocephalus and no venous thrombosis. EEG showed evidence of left sided (periodic lateral epileptiform discharges)PLEDs and patient was loaded with Keppra and started on Keppra 750 mg bid. ICP treated with hypertonic saline and induced hypernatremia. Follow up CCT today shows large left fronto-parietal hematoma with progression of vasogenic edema and extensive SAH left hemisphere. Exact etiology of CVA has not been defined. There may be some underlying mass or vascular malformation which may be masked by the large hemorrhage. PT/OT evaluations done today and CIR recommended by MD and rehab team.    Review of Systems  Unable to perform ROS: language      Past Medical History  Diagnosis Date  . Anxiety     Past Surgical History  Procedure Laterality Date  . Dilation and curettage of uterus      Family History  Problem Relation Age of Onset  . Cancer Mother   . Cancer Father      Social History: Married. Has 37 week old and 2 step children at home. Per reports working and active PTA. reports that she has never smoked. She has never used smokeless tobacco. She reports that she drinks alcohol. She reports that she does not use illicit drugs.    Allergies: No Known Allergies    Medications Prior to Admission  Medication Sig Dispense Refill  . acetaminophen (TYLENOL) 500 MG tablet Take 1,000 mg by mouth every 6 (six) hours as needed for headache.    Marland Kitchen acyclovir (ZOVIRAX) 400 MG tablet Take 400 mg by mouth 5 (five) times daily as needed (for breakout).     Marland Kitchen docusate sodium (COLACE) 100 MG capsule Take 100 mg by mouth daily.    Marland Kitchen FLUoxetine (PROZAC) 40 MG capsule Take 40 mg by mouth daily.    Marland Kitchen ibuprofen (ADVIL,MOTRIN) 600 MG tablet Take 1 tablet (600 mg total) by mouth every 6 (six) hours as needed for moderate pain or cramping. 40 tablet 0  . Prenatal Vit-Fe Fumarate-FA (PRENATAL MULTIVITAMIN) TABS tablet Take 1 tablet by mouth daily at 12 noon.      Home: Home Living Family/patient expects to be discharged to:: Inpatient rehab Living Arrangements: Spouse/significant other, Children Available Help at Discharge: Family, Available 24 hours/day Type of Home: House Home Access: Stairs to enter [employer] of Steps: 3-4 in front and one via garage Home Layout: Two level, Bed/bath upstairs Alternate Level Stairs-Number of Steps: full flight  Bathroom Toilet: Standard Home Equipment: None Additional Comments: Pt lives with spouse, and 46 week old son. She shares custody of children ages 78 and 61. 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 She runs frequently  Functional Status:  Mobility: Bed  Mobility Overal bed mobility: Needs Assistance Bed Mobility: Supine to Sit Supine to sit: Max assist General bed mobility comments: assist for moving right side to EOB and lifting trunk upright Transfers Overall transfer level: Needs assistance Equipment used: 2 person hand held assist Transfers: Squat Pivot Transfers Squat pivot transfers: Max assist, +2 physical assistance General transfer comment: increased time, pt able to bear weight for transfer, but needs assist for pivoting hips Ambulation/Gait General Gait Details: not tested due to HA in sitting and pt fearful due to right side weakness    ADL: ADL Overall ADL's : Needs assistance/impaired Eating/Feeding: Maximal assistance, Sitting Grooming: Wash/dry hands, Wash/dry face, Brushing hair, Maximal assistance, Moderate assistance Grooming Details (indicate cue type and reason): Pt able to identify "comb". but unable to demonstrate use initially. With verbal cues and gestures, she was able to demonstrate use, but demonstrated signfiicant difficulty positioning comb appropriately in relation to hair/head requiring hand over hand assist to complete task  Upper Body Bathing: Maximal assistance, Sitting Lower Body Bathing: Total assistance, Sit to/from stand Upper Body Dressing : Total assistance, Sitting Lower Body Dressing: Total assistance, Sit to/from stand, Sitting/lateral leans Toilet Transfer: Maximal assistance, +2 for safety/equipment, Squat-pivot, BSC Toileting- Clothing Manipulation and Hygiene: Total assistance, Sitting/lateral lean Functional mobility during ADLs: Maximal assistance, +2 for physical assistance  Cognition: Cognition Overall Cognitive Status: Difficult to assess Orientation Level: Oriented to person, Disoriented to time, Disoriented to place, Disoriented to situation Cognition Arousal/Alertness: Awake/alert Behavior During Therapy: Flat affect Overall Cognitive Status: Difficult to assess Area of  Impairment: Following commands Following Commands: Follows one step commands inconsistently General Comments: Pt follows one step commands inconsitently. She appeared to recognize infant son, and interacted appropriately with him within confines of physical and communication impairments  Difficult to assess due to: Impaired communication  Blood pressure 133/77, pulse 67, temperature 98.2 F (36.8 C), temperature source Oral, resp. rate 18, height 5\' 3"  (1.6 m), weight 66.6 kg (146 lb 13.2 oz), SpO2 93 %, unknown if currently breastfeeding. Physical Exam  Nursing note and vitals reviewed. Constitutional: She appears well-developed and well-nourished.  HENT:  Head: Normocephalic and atraumatic.  Eyes: Conjunctivae are normal. Pupils are equal, round, and reactive to light.  Neck: Normal range of motion. Neck supple.  Cardiovascular: Normal rate and regular rhythm.  Respiratory: Effort normal and breath sounds normal.  GI: Soft. Bowel sounds are normal. She exhibits no distension. There is no tenderness.  Musculoskeletal: She exhibits no edema.  Neurological: She is alert.  Alert, pleasant and smiling. Verbal output was limited to yes. Global aphasia with apraxic component. She was unable to pick her name or place with choice of two. Unable to point to items in room. Right spastic hemiparesis. RLE noted to have extensor tone as well as myoclonus. Right gaze preference.  Skin: Skin is warm and dry.  Motor strength is 0/5 in the right deltoid, biceps, triceps, grip, hip flexor, knee extensor, ankle dorsi flexor and plantar flexor Left side has at least 4/5 strength, has some difficulty with following commands although she does fairly well with simple one step commands Difficult to assess sensation secondary to aphasia. She does withdraw to pinch in the right upper and right lower  extremity  Lab Results Last 24 Hours    Results for orders placed or performed during the hospital encounter  of 05/20/15 (from the past 24 hour(s))  Sodium Status: Abnormal   Collection Time: 05/21/15 8:50 PM  Result Value Ref Range   Sodium 149 (H) 135 - 145 mmol/L  Sodium Status: Abnormal   Collection Time: 05/22/15 3:16 AM  Result Value Ref Range   Sodium 150 (H) 135 - 145 mmol/L  Sodium Status: Abnormal   Collection Time: 05/22/15 8:40 AM  Result Value Ref Range   Sodium 148 (H) 135 - 145 mmol/L      Imaging Results (Last 48 hours)    Ct Head Wo Contrast  05/22/2015 CLINICAL DATA: 39 year old female with intracranial hemorrhage. Follow-up. Subsequent encounter. EXAM: CT HEAD WITHOUT CONTRAST TECHNIQUE: Contiguous axial images were obtained from the base of the skull through the vertex without intravenous contrast. COMPARISON: 05/21/2015 MR and 05/20/2015 CT. FINDINGS: Large left posterior frontal -parietal lobe hematoma with extension into the corpus callosum has overall size similar to the prior exam. Interval progression of surrounding vasogenic edema. Mass effect upon the lateral ventricles greater on the left with downward compression better appreciated on recent MR which included coronal imaging. Minimal bowing of the septum towards the right. Extensive subarachnoid hemorrhage greatest left hemisphere most prominent left sylvian fissure and left convexity minimal changed from prior exam. Tiny amount of deep dependent interventricular blood similar to the MR. Cause of intracranial hemorrhage is indeterminate. No CT evidence of large acute thrombotic infarct. No calvarial abnormality. Mastoid air cells, middle ear cavities and visualized paranasal sinuses are clear. IMPRESSION: Large left posterior frontal -parietal lobe hematoma with extension into the corpus callosum has overall size similar to the prior exam. Interval progression of surrounding vasogenic edema. Mass effect upon the lateral ventricles greater on the left with downward  compression better appreciated on recent MR which included coronal imaging. Minimal bowing of the septum towards the right. Extensive subarachnoid hemorrhage greatest left hemisphere most prominent left sylvian fissure and left convexity minimal changed from prior exam. Tiny amount of deep dependent interventricular blood similar to the MR. Cause of intracranial hemorrhage is indeterminate. Electronically Signed By: Genia Del M.D. On: 05/22/2015 07:26   Ct Head Wo Contrast  05/21/2015 CLINICAL DATA: Severe headaches 2 weeks postpartum EXAM: CT HEAD WITHOUT CONTRAST TECHNIQUE: Contiguous axial images were obtained from the base of the skull through the vertex without intravenous contrast. COMPARISON: None. FINDINGS: The ventricles are normal in size and configuration. There is a hemorrhage which appears to arise from the left parietal lobe measuring 6.2 x 3.1 cm. This hemorrhage tracks anteriorly and inferiorly throughout the splenium of the corpus callosum involving most of the splenium of the corpus callosum. Hemorrhage also tracks just superior to the left lateral ventricle. There is subarachnoid hemorrhage throughout much of the brain parenchyma in the supratentorial region on the left with a small amount of subarachnoid hemorrhage crossing to the right slightly anterior to the rostrum of the corpus callosum. There is no subdural or epidural fluid collection. There is no midline shift. Elsewhere gray-white compartments appear normal. The bony calvarium appears intact. The mastoid air cells are clear. IMPRESSION: Extensive intraparenchymal hemorrhage which arises from the left parietal lobe and extends into the splenium of the corpus callosum and slightly superior to the left lateral ventricle. There is extensive subarachnoid hemorrhage on the left with a small amount of subarachnoid hemorrhage crossing to the right anteriorly. The ventricles are not efface,  and there is no midline shift.  There is no extra-axial fluid. Elsewhere gray-white compartments appear normal. Critical Value/emergent results were called by telephone at the time of interpretation on 05/21/2015 at 12:12 am to Dr. Doy Mince, neurology, who verbally acknowledged these results. Electronically Signed By: Lowella Grip III M.D. On: 05/21/2015 00:12   Mr Jodene Nam Head Wo Contrast  05/21/2015 CLINICAL DATA: Headache today, worse than typical migraine. Acute onset RIGHT-sided weakness. Acute RIGHT focal seizure in emergency department. Two weeks postpartum. Evaluate intracranial hemorrhage. EXAM: MRI HEAD WITHOUT CONTRAST MRA HEAD WITHOUT CONTRAST MRV HEAD WITHOUT CONTRAST TECHNIQUE: Multiplanar, multiecho pulse sequences of the brain and surrounding structures were obtained without and with intravenous contrast. Angiographic images of the head were obtained using MRA and MRV technique without contrast. MIP images provided. COMPARISON: CT head May 20, 2015 at 2354 hours FINDINGS: MRI HEAD FINDINGS Acute mesial LEFT parietal intraparenchymal hematoma better seen on prior CT, dissecting along the body of the corpus callosum to the RIGHT. Intraventricular extension with layering blood products in the LEFT lateral ventricle. No hydrocephalus. Minimal surrounding T2 bright vasogenic edema, local sulcal effacement without midline shift. Mass effect on LEFT lateral ventricle. No satellite areas of abnormal parenchymal signal. Susceptibility artifact along the interhemispheric fissure, LEFT sylvian fissure in LEFT cerebellar tentorium consistent with acute blood products seen on today's CT. Bilateral hippocampi demonstrate normal size, morphology and signal characteristics. Ocular globes and orbital contents are unremarkable. Paranasal sinuses and mastoid air cells are well aerated. No abnormal sellar expansion. No cerebellar tonsillar ectopia. No suspicious calvarial bone marrow signal. MRA HEAD FINDINGS Anterior  circulation: Normal flow related enhancement of the included cervical, petrous, cavernous and supra clinoid internal carotid arteries. Patent anterior communicating artery. Normal flow related enhancement of the anterior and middle cerebral arteries, including more distal segments. RIGHT AC 1 segment is dominant. No large vessel occlusion, high-grade stenosis, abnormal luminal irregularity, aneurysm. Posterior circulation: Codominant vertebral arteries. Basilar artery is patent, with normal flow related enhancement of the main branch vessels. Patent posterior cerebral arteries. Slightly decreased and flow related enhancement of the distal LEFT posterior cerebral artery though, with normal in caliber, likely representing slow flow secondary to local mass effect. No large vessel occlusion, high-grade stenosis, abnormal luminal irregularity, aneurysm. MRV HEAD FINDINGS Normal flow related enhancement within the superior sagittal sinus, torcula of the Herophili, bilateral transverse, sigmoid sinuses and included internal jugular veins. Normal flow related enhancement of the internal cerebral veins. IMPRESSION: MRI HEAD: Acute large LEFT parietal intraparenchymal hematoma dissecting along the corpus callosum, intraventricular extension without obstructive hydrocephalus. Scattered subarachnoid hemorrhage better seen on today's CT head. Findings suggest postpartum angiopathy ; postpartum angiopathy typically affects the medium and small vessels which are not well characterized on MRA of the head; follow-up CTA of the head or digital subtraction angiography could be performed as clinically indicated . Location is also typical for venous infarction though, there is no major dural venous sinus thrombosis. Additional consideration are vascular malformation or hemorrhagic mass. Recommend followup MRI of the head with contrast in 2-4 weeks. MRA HEAD: No acute vascular process ; normal MRA head. MRV HEAD: No dural venous  sinus thrombosis ; normal noncontrast MRV head. Electronically Signed By: Elon Alas M.D. On: 05/21/2015 03:35   Mr Brain Wo Contrast  05/21/2015 CLINICAL DATA: Headache today, worse than typical migraine. Acute onset RIGHT-sided weakness. Acute RIGHT focal seizure in emergency department. Two weeks postpartum. Evaluate intracranial hemorrhage. EXAM: MRI HEAD WITHOUT CONTRAST MRA HEAD WITHOUT CONTRAST MRV HEAD WITHOUT  CONTRAST TECHNIQUE: Multiplanar, multiecho pulse sequences of the brain and surrounding structures were obtained without and with intravenous contrast. Angiographic images of the head were obtained using MRA and MRV technique without contrast. MIP images provided. COMPARISON: CT head May 20, 2015 at 2354 hours FINDINGS: MRI HEAD FINDINGS Acute mesial LEFT parietal intraparenchymal hematoma better seen on prior CT, dissecting along the body of the corpus callosum to the RIGHT. Intraventricular extension with layering blood products in the LEFT lateral ventricle. No hydrocephalus. Minimal surrounding T2 bright vasogenic edema, local sulcal effacement without midline shift. Mass effect on LEFT lateral ventricle. No satellite areas of abnormal parenchymal signal. Susceptibility artifact along the interhemispheric fissure, LEFT sylvian fissure in LEFT cerebellar tentorium consistent with acute blood products seen on today's CT. Bilateral hippocampi demonstrate normal size, morphology and signal characteristics. Ocular globes and orbital contents are unremarkable. Paranasal sinuses and mastoid air cells are well aerated. No abnormal sellar expansion. No cerebellar tonsillar ectopia. No suspicious calvarial bone marrow signal. MRA HEAD FINDINGS Anterior circulation: Normal flow related enhancement of the included cervical, petrous, cavernous and supra clinoid internal carotid arteries. Patent anterior communicating artery. Normal flow related enhancement of the anterior and  middle cerebral arteries, including more distal segments. RIGHT AC 1 segment is dominant. No large vessel occlusion, high-grade stenosis, abnormal luminal irregularity, aneurysm. Posterior circulation: Codominant vertebral arteries. Basilar artery is patent, with normal flow related enhancement of the main branch vessels. Patent posterior cerebral arteries. Slightly decreased and flow related enhancement of the distal LEFT posterior cerebral artery though, with normal in caliber, likely representing slow flow secondary to local mass effect. No large vessel occlusion, high-grade stenosis, abnormal luminal irregularity, aneurysm. MRV HEAD FINDINGS Normal flow related enhancement within the superior sagittal sinus, torcula of the Herophili, bilateral transverse, sigmoid sinuses and included internal jugular veins. Normal flow related enhancement of the internal cerebral veins. IMPRESSION: MRI HEAD: Acute large LEFT parietal intraparenchymal hematoma dissecting along the corpus callosum, intraventricular extension without obstructive hydrocephalus. Scattered subarachnoid hemorrhage better seen on today's CT head. Findings suggest postpartum angiopathy ; postpartum angiopathy typically affects the medium and small vessels which are not well characterized on MRA of the head; follow-up CTA of the head or digital subtraction angiography could be performed as clinically indicated . Location is also typical for venous infarction though, there is no major dural venous sinus thrombosis. Additional consideration are vascular malformation or hemorrhagic mass. Recommend followup MRI of the head with contrast in 2-4 weeks. MRA HEAD: No acute vascular process ; normal MRA head. MRV HEAD: No dural venous sinus thrombosis ; normal noncontrast MRV head. Electronically Signed By: Elon Alas M.D. On: 05/21/2015 03:35   Dg Chest Portable 1 View  05/21/2015 CLINICAL DATA: Central line repositioning. Initial  encounter. EXAM: PORTABLE CHEST - 1 VIEW COMPARISON: None. FINDINGS: The patient's left IJ line has been retracted, and is now seen ending overlying the proximal SVC. The lungs are well-aerated and clear. There is no evidence of focal opacification, pleural effusion or pneumothorax. The cardiomediastinal silhouette is borderline normal in size. No acute osseous abnormalities are seen. IMPRESSION: 1. Left IJ line has been retracted, and is now seen ending overlying the proximal SVC. 2. No acute cardiopulmonary process seen. Electronically Signed By: Garald Balding M.D. On: 05/21/2015 02:21   Dg Chest Portable 1 View  05/21/2015 CLINICAL DATA: Left central line placement. Initial encounter. EXAM: PORTABLE CHEST - 1 VIEW COMPARISON: None. FINDINGS: The patient's left IJ line is noted extending overlying the right brachiocephalic vein.  This should be retracted at least 5 cm and repositioned. The lungs are hypoexpanded. No pleural effusion or pneumothorax is seen. The cardiomediastinal silhouette is borderline normal in size. No acute osseous abnormalities are identified. IMPRESSION: 1. Left IJ line noted ending overlying the right brachiocephalic vein. This should be retracted at least 5 cm and repositioned, as deemed clinically appropriate. 2. Lungs hypoexpanded but grossly clear. These results were called by telephone at the time of interpretation on 05/21/2015 at 2:15 am to [facility], who verbally acknowledged these results. Electronically Signed By: Garald Balding M.D. On: 05/21/2015 02:15   Mr Mrv Head Wo Cm  05/21/2015 CLINICAL DATA: Headache today, worse than typical migraine. Acute onset RIGHT-sided weakness. Acute RIGHT focal seizure in emergency department. Two weeks postpartum. Evaluate intracranial hemorrhage. EXAM: MRI HEAD WITHOUT CONTRAST MRA HEAD WITHOUT CONTRAST MRV HEAD WITHOUT CONTRAST TECHNIQUE: Multiplanar, multiecho pulse sequences of the brain and  surrounding structures were obtained without and with intravenous contrast. Angiographic images of the head were obtained using MRA and MRV technique without contrast. MIP images provided. COMPARISON: CT head May 20, 2015 at 2354 hours FINDINGS: MRI HEAD FINDINGS Acute mesial LEFT parietal intraparenchymal hematoma better seen on prior CT, dissecting along the body of the corpus callosum to the RIGHT. Intraventricular extension with layering blood products in the LEFT lateral ventricle. No hydrocephalus. Minimal surrounding T2 bright vasogenic edema, local sulcal effacement without midline shift. Mass effect on LEFT lateral ventricle. No satellite areas of abnormal parenchymal signal. Susceptibility artifact along the interhemispheric fissure, LEFT sylvian fissure in LEFT cerebellar tentorium consistent with acute blood products seen on today's CT. Bilateral hippocampi demonstrate normal size, morphology and signal characteristics. Ocular globes and orbital contents are unremarkable. Paranasal sinuses and mastoid air cells are well aerated. No abnormal sellar expansion. No cerebellar tonsillar ectopia. No suspicious calvarial bone marrow signal. MRA HEAD FINDINGS Anterior circulation: Normal flow related enhancement of the included cervical, petrous, cavernous and supra clinoid internal carotid arteries. Patent anterior communicating artery. Normal flow related enhancement of the anterior and middle cerebral arteries, including more distal segments. RIGHT AC 1 segment is dominant. No large vessel occlusion, high-grade stenosis, abnormal luminal irregularity, aneurysm. Posterior circulation: Codominant vertebral arteries. Basilar artery is patent, with normal flow related enhancement of the main branch vessels. Patent posterior cerebral arteries. Slightly decreased and flow related enhancement of the distal LEFT posterior cerebral artery though, with normal in caliber, likely representing slow flow  secondary to local mass effect. No large vessel occlusion, high-grade stenosis, abnormal luminal irregularity, aneurysm. MRV HEAD FINDINGS Normal flow related enhancement within the superior sagittal sinus, torcula of the Herophili, bilateral transverse, sigmoid sinuses and included internal jugular veins. Normal flow related enhancement of the internal cerebral veins. IMPRESSION: MRI HEAD: Acute large LEFT parietal intraparenchymal hematoma dissecting along the corpus callosum, intraventricular extension without obstructive hydrocephalus. Scattered subarachnoid hemorrhage better seen on today's CT head. Findings suggest postpartum angiopathy ; postpartum angiopathy typically affects the medium and small vessels which are not well characterized on MRA of the head; follow-up CTA of the head or digital subtraction angiography could be performed as clinically indicated . Location is also typical for venous infarction though, there is no major dural venous sinus thrombosis. Additional consideration are vascular malformation or hemorrhagic mass. Recommend followup MRI of the head with contrast in 2-4 weeks. MRA HEAD: No acute vascular process ; normal MRA head. MRV HEAD: No dural venous sinus thrombosis ; normal noncontrast MRV head. Electronically Signed By: Elon Alas  M.D. 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 Does the need for close, 24 hr/day medical supervision in concert with the patient's rehab needs make it unreasonable for this patient to be served in a less intensive setting? Yes 2. Co-Morbidities requiring supervision/potential complications: aphasia, apraxia,Right inattention 3. Due to bladder management, bowel management, safety, skin/wound care, disease management, medication administration, pain management and patient education, does the patient require 24 hr/day rehab nursing?  Yes 4. Does the patient require coordinated care of a physician, rehab nurse, PT (11-2 hrs/day, 5 days/week), OT (1-2 hrs/day, 5 days/week) and SLP (.5-1 hrs/day, 5 days/week) to address physical and functional deficits in the context of the above medical diagnosis(es)? Yes Addressing deficits in the following areas: balance, endurance, locomotion, strength, transferring, bowel/bladder control, bathing, dressing, feeding, grooming, toileting and cognition 5. Can the patient actively participate in an intensive therapy program of at least 3 hrs of therapy per day at least 5 days per week? Yes 6. The potential for patient to make measurable gains while on inpatient rehab is excellent 7. Anticipated functional outcomes upon discharge from inpatient rehab are min assist with PT, min assist with OT, min assist with SLP. 8. Estimated rehab length of stay to reach the above functional goals is: 20-25 days 9. Does the patient have adequate social supports and living environment to accommodate these discharge functional goals? Yes 10. Anticipated D/C setting: Home 11. Anticipated post D/C treatments: Outpatient therapy 12. Overall Rehab/Functional Prognosis: excellent  RECOMMENDATIONS: This patient's condition is appropriate for continued rehabilitative care in the following setting: CIR Patient has agreed to participate in recommended program. N/A Note that insurance prior authorization may be required for reimbursement for recommended care.  Comment:     05/22/2015

## 2015-05-28 NOTE — Progress Notes (Signed)
Recreational Therapy Session Note  Patient Details  Name: Ashley [name] MRN: [id number] Date of Birth: 09-27-1976 Today's Date: 05/28/2015   Order received and chart reviewed.  Pt placed on HOLD as she is not yet appropriate for TR services.  Will continue to monitor through team for future participation.  [place] 05/28/2015, 3:30 PM

## 2015-05-28 NOTE — Progress Notes (Signed)
Occupational Therapy Session Note  Patient Details  Name: Ashley [name] MRN: [id number] Date of Birth: 1976-07-05  Today's Date: 05/28/2015 OT Individual Time: 1100-1200 OT Individual Time Calculation (min): 60 min    Short Term Goals: Week 1:  OT Short Term Goal 1 (Week 1): Pt. will feed self with LUE with supervision OT Short Term Goal 2 (Week 1): Pt. sill maintain sustained atteniton for 2 minutes during grooming activity OT Short Term Goal 3 (Week 1): Pt. will bath self with max assist OT Short Term Goal 4 (Week 1): Pt. will dress UB with max assist OT Short Term Goal 5 (Week 1): Pt. will transfer to toilet with max assist  Skilled Therapeutic Interventions/Progress Updates:    Pt seen for OT ADL bathing and dressing session. Pt in w/c upon arrival, agreeable to tx session. She completed bathing/ dressing seated in w/c at sink. Max VCs required for pt to attain and maintain midline sitting, using mirror for visual feedback in regards to positioning. She demonstrated decreased attention to R side of body during dressing tasks, requiring max VCs and assist to dress R side. Pt stood at sink with max A and blocking of R knee to stand for assist to pull pants up.  Throughout session, pt demonstrated varying amounts of aphasia during functional communication. Able to say some basic needs, however, unable to respond to open ended questions or consistently to yes/no questions.  Pt voiced desire to return to bed at end of session. Max A transfer to R with second person available for safety. Max A +2 required for return to supine as pt strong pusher to the R. Pt left in supine at end of session with NT present delivering care.  Pt and pt's husand educated throughout session regarding hemi-dressing techniques, and proper positioning. Left R lap tray in pt's room for use during next w/c sitting trial, educated pt's husband regarding purpose.   Therapy Documentation Precautions:   Precautions Precautions: Fall Precaution Comments: RUE flexortone Restrictions Weight Bearing Restrictions: No Pain: Pain Assessment Pain Assessment: No/denies pain  See FIM for current functional status  Therapy/Group: Individual Therapy  Lewis, Fleetwood Pierron C 05/28/2015, 12:08 PM

## 2015-05-29 ENCOUNTER — Inpatient Hospital Stay (HOSPITAL_COMMUNITY): Payer: BLUE CROSS/BLUE SHIELD | Admitting: Speech Pathology

## 2015-05-29 ENCOUNTER — Inpatient Hospital Stay (HOSPITAL_COMMUNITY): Payer: BLUE CROSS/BLUE SHIELD | Admitting: Rehabilitation

## 2015-05-29 ENCOUNTER — Inpatient Hospital Stay (HOSPITAL_COMMUNITY): Payer: BLUE CROSS/BLUE SHIELD

## 2015-05-29 DIAGNOSIS — G8110 Spastic hemiplegia affecting unspecified side: Secondary | ICD-10-CM

## 2015-05-29 LAB — BASIC METABOLIC PANEL
ANION GAP: 7 (ref 5–15)
BUN: <5 mg/dL — ABNORMAL LOW (ref 6–20)
CALCIUM: 8.6 mg/dL — AB (ref 8.9–10.3)
CO2: 27 mmol/L (ref 22–32)
Chloride: 103 mmol/L (ref 101–111)
Creatinine, Ser: 0.39 mg/dL — ABNORMAL LOW (ref 0.44–1.00)
GLUCOSE: 95 mg/dL (ref 65–99)
Potassium: 3.9 mmol/L (ref 3.5–5.1)
SODIUM: 137 mmol/L (ref 135–145)

## 2015-05-29 MED ORDER — QUETIAPINE FUMARATE 25 MG PO TABS
25.0000 mg | ORAL_TABLET | Freq: Every day | ORAL | Status: DC
  Administered 2015-05-29 – 2015-06-01 (×4): 25 mg via ORAL
  Filled 2015-05-29 (×4): qty 1

## 2015-05-29 MED ORDER — QUETIAPINE FUMARATE 25 MG PO TABS: 25.0000 mg | ORAL_TABLET | Freq: Every day | ORAL | Status: DC

## 2015-05-29 MED ORDER — ALPRAZOLAM 0.25 MG PO TABS
0.2500 mg | ORAL_TABLET | Freq: Once | ORAL | Status: AC
  Administered 2015-05-29: 0.25 mg via ORAL
  Filled 2015-05-29: qty 1

## 2015-05-29 NOTE — IPOC Note (Addendum)
Overall Plan of Care [facility]) Patient Details Name: Ashley [name] MRN: [id number] DOB: May 10, 1976  Admitting Diagnosis: Left  ICH  Hospital Problems: Active Problems:   ICH (intracerebral hemorrhage)   Right spastic hemiparesis   Aphasia following nontraumatic intracerebral hemorrhage     Functional Problem List: Nursing Behavior, Bladder, Bowel, Endurance, Medication Management, Motor, Nutrition, Pain, Perception, Safety, Sensory  PT Balance, Endurance, Motor, Perception, Safety  OT Balance, Behavior, Cognition, Endurance, Motor, Pain, Perception, Safety, Sensory, Skin Integrity, Vision  SLP Cognition, Nutrition, Linguistic  TR         Basic ADL's: OT Eating, Grooming, Bathing, Dressing, Toileting     Advanced  ADL's: OT       Transfers: PT Bed Mobility, Bed to Chair, Car, Manufacturing systems engineer, Metallurgist: PT Ambulation, Emergency planning/management officer, Stairs     Additional Impairments: OT Fuctional Use of Upper Extremity  SLP Swallowing, Communication, Social Cognition comprehension, expression Problem Solving, Memory, Attention, Awareness  TR      Anticipated Outcomes Item Anticipated Outcome  Self Feeding supervision  Swallowing  Supervision    Basic self-care  min assist  Toileting  min assist   Bathroom Transfers min assist  Bowel/Bladder  To be continent of bowel and bladder with min assist.  Transfers  min A   Locomotion  min A from an ambulatory level   Communication  Min assist   Cognition  Min assist   Pain  Pain level 3 or less on a scale of 0-10.  Safety/Judgment  Safety/judgement with min assist.   Therapy Plan: PT Intensity: Minimum of 1-2 x/day ,45 to 90 minutes PT Frequency: 5 out of 7 days PT Duration Estimated Length of Stay: 28 days  OT Intensity: Minimum of 1-2 x/day, 45 to 90 minutes OT Frequency: 5 out of 7 days OT Duration/Estimated Length of Stay: 4 weeks SLP Intensity: Minumum of 1-2 x/day, 30 to 90 minutes SLP  Frequency: 3 to 5 out of 7 days SLP Duration/Estimated Length of Stay: 14-21 days        Team Interventions: Nursing Interventions Patient/Family Education, Bladder Management, Bowel Management, Disease Management/Prevention, Pain Management, Medication Management, Cognitive Remediation/Compensation, Dysphagia/Aspiration Precaution Training, Discharge Planning, Psychosocial Support  PT interventions Ambulation/gait training, Training and development officer, Cognitive remediation/compensation, Community reintegration, Discharge planning, DME/adaptive equipment instruction, Disease management/prevention, Functional electrical stimulation, Functional mobility training, Neuromuscular re-education, Patient/family education, Psychosocial support, Splinting/orthotics, Stair training, Therapeutic Activities, Therapeutic Exercise, UE/LE Strength taining/ROM, UE/LE Coordination activities, Visual/perceptual remediation/compensation, Wheelchair propulsion/positioning  OT Interventions Training and development officer, Cognitive remediation/compensation, Discharge planning, Disease mangement/prevention, DME/adaptive equipment instruction, Functional mobility training, Functional electrical stimulation, Neuromuscular re-education, Pain management, Patient/family education, Psychosocial support, Self Care/advanced ADL retraining, Splinting/orthotics, Therapeutic Activities, Therapeutic Exercise, Visual/perceptual remediation/compensation, Wheelchair propulsion/positioning, UE/LE Coordination activities, UE/LE Strength taining/ROM  SLP Interventions Cognitive remediation/compensation, Cueing hierarchy, Dysphagia/aspiration precaution training, Internal/external aids, Environmental controls, Functional tasks, Speech/Language facilitation, Patient/family education, Multimodal communication approach  TR Interventions    SW/CM Interventions Discharge Planning, Psychosocial Support, Patient/Family Education    Team Discharge  Planning: Destination: PT-Home ,OT- Home , SLP-Home Projected Follow-up: PT-Home health PT, 24 hour supervision/assistance, OT-  Home health OT, Outpatient OT, 24 hour supervision/assistance, SLP-24 hour supervision/assistance, Home Health SLP, Outpatient SLP Projected Equipment Needs: PT-To be determined, OT- 3 in 1 bedside comode, Standard walker, Tub/shower bench, SLP-None recommended by SLP Equipment Details: PT- , OT-  Patient/family involved in discharge planning: PT- Patient, Family member/caregiver,  OT-Patient, SLP-Patient, Family member/caregiver  MD ELOS: 21-25 Medical Rehab Prognosis:  Good  Assessment: 39 y.o. female two weeks post-partum with complaints of HA on and off since delivery. She was admitted via [facility] with severe HA and developed right facial droop with aphasia and right sided weakness after admission. She was transferred to [facility] and developed right focal seizure en route treated with ativan. MRI/MRA brain done revealing acute large LEFT parietal intraparenchymal hematoma dissecting along the corpus callosum with intraventricular extension, scattered SAH with findings suggestive of postpartum angiopathy, no obstructive hydrocephalus and no venous thrombosis. MRV without dural venous sinus thrombosis. EEG showed evidence of left sided (periodic lateral epileptiform discharges)PLEDs and patient was loaded with Keppra and started on Keppra 750 mg bid. ICP treated with hypertonic saline and induced hypernatremia. Follow up CCT showed large left fronto-parietal hematoma with progression of vasogenic edema and extensive SAH left hemisphere.   Now requiring 24/7 Rehab RN,MD, as well as CIR level PT, OT and SLP.  Treatment team will focus on ADLs and mobility with goals set at [facility]  See Team Conference Notes for weekly updates to the plan of care

## 2015-05-29 NOTE — Progress Notes (Signed)
Physical Therapy Session Note  Patient Details  Name: Ashley [name] MRN: [id number] Date of Birth: 1976-04-09  Today's Date: 05/29/2015 PT Individual Time: 1330-1405 PT Individual Time Calculation (min): 35 min   Short Term Goals: Week 1:  PT Short Term Goal 1 (Week 1): Pt will perform bed mobility at mod A level with 50% cues to attend to RUE/LE PT Short Term Goal 2 (Week 1): Pt will perform functional transfers to the R and L at mod A level with 50% cues to attend to RUE/LE PT Short Term Goal 3 (Week 1): Pt will perform sit<>stand at max A of single therapist  PT Short Term Goal 4 (Week 1): Pt will propel w/c using L hemi technique at S level x 150'   Skilled Therapeutic Interventions/Progress Updates:   Pt received sitting in w/c, agreeable to therapy session.  Mother, Clarene Critchley present to observe session.  Skilled session focused on functional transfers, see details below, dynamic sitting balance, midline orientation, trunk lengthening and pelvic mobility.  Performed transfers again with +2A with heavy cues for forward weight shift and decreased use of LUE due to increased pushing.  While on mat, performed several reps of reaching upwards and to the R for trunk lengthening, forward weight shift, and increased WB on LLE.  Tolerated, however with increased fatigue and max encouragement to participate.  Attempted sit<>stand x 2 reps, however unable to get fully upright despite max cues.   Assisted back to w/c as above. Assisted back to room and left in w/c (per PA request) with QRB and mother present.  Education to mother to notify nursing if she leaves as pt will need to be at nursing station.   Therapy Documentation Precautions:  Precautions Precautions: Fall Precaution Comments: RUE flexortone Restrictions Weight Bearing Restrictions: No General: PT Amount of Missed Time (min): 10 Minutes PT Missed Treatment Reason: Patient fatigue   Pain: Pt with no c/o pain during session, only  fatigue.   Function:  Toileting Toileting       Bed Mobility Roll left and right activity   Assist level: Mod assist (Pt 50 - 74%)  Sit to lying activity   Assist level: Max assist (Pt 25 - 49%)  Lying to sitting activity   Assist level: Maximal assist (Pt 25 - 49%)  Mobility details Bed mobility details: Verbal cues for techniques;Verbal cues for safe use of DME/AE;Verbal cues for sequencing;Verbal cues for precautions/safety;Manual facilitation for weight shifting;Manual facilitation for placement;Manual facilitation for weight bearing   Transfers Sit to stand transfer   Sit to stand assist level: Maximal assist (Pt 25 - 49%/lift and lower)    Chair/bed transfer     Chair/bed transfer assist level: 2 helpers Chair/bed transfer assistive device: Armrests   Chair/bed transfer details: Visual cues/gestures for precautions/safety;Verbal cues for technique;Verbal cues for precautions/safety;Verbal cues for safe use of DME/AE;Manual facilitation for weight shifting;Manual facilitation for placement;Manual facilitation for weight bearing   Toilet transfer        Car transfer          Locomotion Ambulation          Walk 10 feet activity      Walk 50 feet with 2 turns activity      Walk 150 feet activity      Walk 10 feet on uneven surfaces activity      Stairs          Walk up/down 1 step activity        Walk up/down  4 steps activity      Walk up/down 12 steps activity      Pick up small objects from floor      Wheelchair          Wheel 50 feet with 2 turns activity      Wheel 150 feet activity       Cognition Comprehension Comprehension assist level: Understands basic 25 - 49% of the time/ requires cueing 50 - 75% of the time  Expression Expression assist level: Expresses basis less than 25% of the time/requires cueing >75% of the time.  Social Interaction Social Interaction assist level: Interacts appropriately 25 - 49% of time - Needs frequent  redirection.  Problem Solving Problem solving assist level: Solves basic less than 25% of the time - needs direction nearly all the time or does not effectively solve problems and may need a restraint for safety  Memory      Therapy/Group: Individual Therapy  Denice Bors 05/29/2015, 2:09 PM

## 2015-05-29 NOTE — Progress Notes (Signed)
Occupational Therapy Session Note  Patient Details  Name: Ashley [name] MRN: [id number] Date of Birth: 01-Oct-1976  Today's Date: 05/29/2015 OT Individual Time: 2500-3704 OT Individual Time Calculation (min): 60 min   Short Term Goals: Week 1:  OT Short Term Goal 1 (Week 1): Pt. will feed self with LUE with supervision OT Short Term Goal 2 (Week 1): Pt. sill maintain sustained atteniton for 2 minutes during grooming activity OT Short Term Goal 3 (Week 1): Pt. will bath self with max assist OT Short Term Goal 4 (Week 1): Pt. will dress UB with max assist OT Short Term Goal 5 (Week 1): Pt. will transfer to toilet with max assist  Skilled Therapeutic Interventions/Progress Updates: ADL-retraining with focus on improved alertness, right attention, transfers, and static standing balance.   Pt received supine in bed with husband attending.  Pt was lethargic, speaking with language of confusion, but attentive to therapist's and her husband's prompts.   Pt able to roll to her right with min assist to place right arm and leg.   Pt maintained sitting balance with steadying assist and completed stand-pivot transfer to w/c with max assist (pt = 70% to rise and 80% to lower to w/c) due to right lean.   Pt then washed at sink seated in w/c with max vc and manual assist to manage RUE.   Pt deferred removing sports bar and grooming due to lethargy and was repositioned to self-feed at bedside prior to return to bed level for bathing/dressing of lower body.   Pt self-fed approx 50% of her frosted flakes with max vc and encouragement while continuously requesting to return to bed d/t extreme fatigue.  Pt completed transfer to bed with overall max assist and required max assist to wash lower body (peri-area and buttocks) after attempting to wash her peri-area herself.   OT and husband as second helper provided total assist replace briefs and pants.  Pt left in bed at end of session as husband left room to return to home  to shower himself.  Therapy Documentation Precautions:  Precautions Precautions: Fall Precaution Comments: RUE flexortone Restrictions Weight Bearing Restrictions: No   Pain: No/denies pain    See FIM for current functional status  Therapy/Group: Individual Therapy  [place] 05/29/2015, 2:53 PM

## 2015-05-29 NOTE — Progress Notes (Signed)
Physical Therapy Session Note  Patient Details  Name: Ashley [name] MRN: [id number] Date of Birth: 1976-08-09  Today's Date: 05/29/2015 PT Individual Time: 1130-1158 PT Individual Time Calculation (min): 28 min   Short Term Goals: Week 1:  PT Short Term Goal 1 (Week 1): Pt will perform bed mobility at mod A level with 50% cues to attend to RUE/LE PT Short Term Goal 2 (Week 1): Pt will perform functional transfers to the R and L at mod A level with 50% cues to attend to RUE/LE PT Short Term Goal 3 (Week 1): Pt will perform sit<>stand at max A of single therapist  PT Short Term Goal 4 (Week 1): Pt will propel w/c using L hemi technique at S level x 150'   Skilled Therapeutic Interventions/Progress Updates:   Pt received lying in bed, very lethargic and difficult to arouse, but pt agreeable to attempting therapy.  Assisted with rolling and bed mobility as stated below.  Needs max to total A cues for initiation, attention to the R.  Once at EOB, assisted with scooting hips for BLE to be placed on floor.  PT sat in front of pt in order to work on midline orientation, L lateral weight shifts to/from elbow and forward L lateral reaching to carryover to standing and transfers.  Pt able to sit at close S to min A level during session with constant cues for re-aligning with therapist.  Pt with increasing lethargy and husband Ashley [name] in room stating that she slept maybe 30 mins last night and is to receive Seroquil tonight to improve sleep cycle.  Assisted back to bed with max A and +2A to scoot in bed.  Left with all needs in reach and bed alarm set.  Husband in room.   Therapy Documentation Precautions:  Precautions Precautions: Fall Precaution Comments: RUE flexortone Restrictions Weight Bearing Restrictions: No   Pain: Pt with grimacing with forward weight shift, states back pain, allowed rest breaks to decrease pain.   Function:  Toileting Toileting       Bed Mobility Roll left and right  activity   Assist level: Mod assist (Pt 50 - 74%)  Sit to lying activity   Assist level: Max assist (Pt 25 - 49%)  Lying to sitting activity   Assist level: Maximal assist (Pt 25 - 49%)  Mobility details Bed mobility details: Verbal cues for techniques;Verbal cues for safe use of DME/AE;Verbal cues for sequencing;Verbal cues for precautions/safety;Manual facilitation for weight shifting;Manual facilitation for placement;Manual facilitation for weight bearing   Transfers Sit to stand transfer        Chair/bed transfer               Toilet transfer        Car transfer          Locomotion Ambulation          Walk 10 feet activity      Walk 50 feet with 2 turns activity      Walk 150 feet activity      Walk 10 feet on uneven surfaces activity      Stairs          Walk up/down 1 step activity        Walk up/down 4 steps activity      Walk up/down 12 steps activity      Pick up small objects from floor      Wheelchair          Wheel 50  feet with 2 turns activity      Wheel 150 feet activity       Cognition Comprehension Comprehension assist level: Understands basic 25 - 49% of the time/ requires cueing 50 - 75% of the time  Expression Expression assist level: Expresses basis less than 25% of the time/requires cueing >75% of the time.  Social Interaction Social Interaction assist level: Interacts appropriately 25 - 49% of time - Needs frequent redirection.  Problem Solving Problem solving assist level: Solves basic less than 25% of the time - needs direction nearly all the time or does not effectively solve problems and may need a restraint for safety  Memory      Therapy/Group: Individual Therapy  Denice Bors 05/29/2015, 12:37 PM

## 2015-05-29 NOTE — Progress Notes (Signed)
Subjective/Complaints: Pt up all night, not pulling at foley Fell asleep 4am now somnolent , not participating in rehab this am  Objective: Vital Signs: Blood pressure 112/70, pulse 67, temperature 97.6 F (36.4 C), temperature source Oral, resp. rate 18, height 5' 3" (1.6 m), weight 66.6 kg (146 lb 13.2 oz), SpO2 97 %, unknown if currently breastfeeding. No results found. Results for orders placed or performed during the hospital encounter of 05/26/15 (from the past 72 hour(s))  CBC WITH DIFFERENTIAL     Status: Abnormal   Collection Time: 05/27/15  6:50 AM  Result Value Ref Range   WBC 10.9 (H) 4.0 - 10.5 K/uL   RBC 4.27 3.87 - 5.11 MIL/uL   Hemoglobin 12.6 12.0 - 15.0 g/dL   HCT 37.4 36.0 - 46.0 %   MCV 87.6 78.0 - 100.0 fL   MCH 29.5 26.0 - 34.0 pg   MCHC 33.7 30.0 - 36.0 g/dL   RDW 13.4 11.5 - 15.5 %   Platelets 157 150 - 400 K/uL   Neutrophils Relative % 76 43 - 77 %   Neutro Abs 8.3 (H) 1.7 - 7.7 K/uL   Lymphocytes Relative 15 12 - 46 %   Lymphs Abs 1.6 0.7 - 4.0 K/uL   Monocytes Relative 7 3 - 12 %   Monocytes Absolute 0.7 0.1 - 1.0 K/uL   Eosinophils Relative 2 0 - 5 %   Eosinophils Absolute 0.2 0.0 - 0.7 K/uL   Basophils Relative 0 0 - 1 %   Basophils Absolute 0.0 0.0 - 0.1 K/uL  Comprehensive metabolic panel     Status: Abnormal   Collection Time: 05/27/15  6:50 AM  Result Value Ref Range   Sodium 135 135 - 145 mmol/L   Potassium 3.1 (L) 3.5 - 5.1 mmol/L   Chloride 101 101 - 111 mmol/L   CO2 27 22 - 32 mmol/L   Glucose, Bld 98 65 - 99 mg/dL   BUN 8 6 - 20 mg/dL   Creatinine, Ser 0.37 (L) 0.44 - 1.00 mg/dL   Calcium 8.0 (L) 8.9 - 10.3 mg/dL   Total Protein 5.0 (L) 6.5 - 8.1 g/dL   Albumin 2.7 (L) 3.5 - 5.0 g/dL   AST 36 15 - 41 U/L   ALT 30 14 - 54 U/L   Alkaline Phosphatase 64 38 - 126 U/L   Total Bilirubin 0.9 0.3 - 1.2 mg/dL   GFR calc non Af Amer >60 >60 mL/min   GFR calc Af Amer >60 >60 mL/min    Comment: (NOTE) The eGFR has been calculated using  the CKD EPI equation. This calculation has not been validated in all clinical situations. eGFR's persistently <60 mL/min signify possible Chronic Kidney Disease.    Anion gap 7 5 - 15  Basic metabolic panel     Status: Abnormal   Collection Time: 05/29/15  5:40 AM  Result Value Ref Range   Sodium 137 135 - 145 mmol/L   Potassium 3.9 3.5 - 5.1 mmol/L    Comment: DELTA CHECK NOTED   Chloride 103 101 - 111 mmol/L   CO2 27 22 - 32 mmol/L   Glucose, Bld 95 65 - 99 mg/dL   BUN <5 (L) 6 - 20 mg/dL   Creatinine, Ser 0.39 (L) 0.44 - 1.00 mg/dL   Calcium 8.6 (L) 8.9 - 10.3 mg/dL   GFR calc non Af Amer >60 >60 mL/min   GFR calc Af Amer >60 >60 mL/min    Comment: (NOTE)  The eGFR has been calculated using the CKD EPI equation. This calculation has not been validated in all clinical situations. eGFR's persistently <60 mL/min signify possible Chronic Kidney Disease.    Anion gap 7 5 - 15     HEENT: normal Cardio: RRR and no murmur Resp: CTA B/L and unlabored GI: BS positive and NT, ND Extremity:  BS positive and NT, ND Skin:   Rash vulvar erythema Neuro: Alert/Oriented, Abnormal Sensory reduced sensation to pinch RUE, Abnormal Motor 0/5 RUE, Trace knee ext RLE and Aphasic Musc/Skel:  Other no pain with UE or LE ROM Gen NAD   Assessment/Plan: 1. Functional deficits secondary to  Right flaccid hemiplegia,dysphagia, and aphasia secondary to large left parietal intracranial hemorrhage which require 3+ hours per day of interdisciplinary therapy in a comprehensive inpatient rehab setting. Physiatrist is providing close team supervision and 24 hour management of active medical problems listed below. Physiatrist and rehab team continue to assess barriers to discharge/monitor patient progress toward functional and medical goals. FIM:    Function- Upper Body Dressing/Undressing What is the patient wearing?: Put head through opening of pull over shirt/dress Function - Lower Body  Dressing/Undressing Lower body dressing/undressing activity did not occur: 1: Total-Patient completed less than 25% of tasks What is the patient wearing?: Thread/unthread left pants leg  Function - Toileting Toileting steps completed by patient: Adjust clothing prior to toileting, Performs perineal hygiene, Adjust clothing after toileting Toileting Assistive Devices: Grab bar or rail for support  Function - Air cabin crew transfer assistive device: Elevated toilet seat  Function - Chair/bed transfer Chair/bed transfer assistive device: Arm rests     Function - Comprehension Comprehension: Auditory  Function - Expression Expression: Verbal           Medical Problem List and Plan: 1. Functional deficits secondary to Right flaccid hemiplegia,dysphagia, and aphasia secondary to large left parietal intracranial hemorrhage, also severe cog def 2. DVT Prophylaxis/Anticoagulation: Pharmaceutical: Lovenox 3. Pain Management: Tylenol prn 4. Mood: LCSW to follow for evaluation and support as cognition/aphasia improves.  5. Neuropsych: This patient is not capable of making decisions on her own behalf. 6. Skin/Wound Care: Routine pressure relief measures.  7. Fluids/Electrolytes/Nutrition: Monitor I/O. 40-50% meals on 8/15 8. Insomnia?:and agitation d/c trazodone, start seroquel 9. New onset seizures: On keppra bid. 10. HTN: Monitor BP every 8 hours. Being treated prn.   LOS (Days) 3 A FACE TO FACE EVALUATION WAS PERFORMED  KIRSTEINS,ANDREW E 05/29/2015, 8:28 AM

## 2015-05-29 NOTE — Progress Notes (Signed)
SLP Cancellation Note  Patient Details Name: Marleigh Kaylor MRN: [id number] DOB: 1975/11/08   Cancelled treatment:        Attempted to see pt x2 this AM.  Pt laying in bed,  very lethargic, only briefly awakened with cold compress, sternal rub and loud voice.  No family present at bedside.  Attempted to stimulate pt further by turning on lights and elevating head of bed but pt remained somnolent both at initial attempt and second attempt 30 minutes later.    Also attempted to see pt for speech therapy this afternoon as pt is reportedly more alert in comparison to this morning; however, pt has been signed out by family member and is currently off the unit.  As a result, pt missed 60 minutes of skilled ST.  Will re-attempt to see pt at next available appointment.  Continue per current plan of care.                                                                                                  Chirsty Armistead, Selinda Orion 05/29/2015, 3:08 PM

## 2015-05-30 ENCOUNTER — Inpatient Hospital Stay (HOSPITAL_COMMUNITY): Payer: BLUE CROSS/BLUE SHIELD | Admitting: Physical Therapy

## 2015-05-30 ENCOUNTER — Inpatient Hospital Stay (HOSPITAL_COMMUNITY): Payer: BLUE CROSS/BLUE SHIELD | Admitting: Speech Pathology

## 2015-05-30 ENCOUNTER — Inpatient Hospital Stay (HOSPITAL_COMMUNITY): Payer: BLUE CROSS/BLUE SHIELD | Admitting: Rehabilitation

## 2015-05-30 LAB — URINALYSIS, ROUTINE W REFLEX MICROSCOPIC
BILIRUBIN URINE: NEGATIVE
Glucose, UA: NEGATIVE mg/dL
KETONES UR: NEGATIVE mg/dL
NITRITE: POSITIVE — AB
PH: 8 (ref 5.0–8.0)
Protein, ur: NEGATIVE mg/dL
Specific Gravity, Urine: 1.007 (ref 1.005–1.030)
Urobilinogen, UA: 0.2 mg/dL (ref 0.0–1.0)

## 2015-05-30 LAB — URINE MICROSCOPIC-ADD ON

## 2015-05-30 MED ORDER — TIZANIDINE HCL 2 MG PO TABS
2.0000 mg | ORAL_TABLET | Freq: Every day | ORAL | Status: DC
  Administered 2015-05-30: 2 mg via ORAL
  Filled 2015-05-30: qty 1

## 2015-05-30 MED ORDER — TRAZODONE HCL 50 MG PO TABS
50.0000 mg | ORAL_TABLET | Freq: Every evening | ORAL | Status: DC | PRN
  Administered 2015-06-01 – 2015-06-02 (×2): 50 mg via ORAL
  Filled 2015-05-30 (×2): qty 1

## 2015-05-30 NOTE — Progress Notes (Addendum)
Subjective/Complaints: Woke up multiple times last noc with foley removed, had inc of bladder Cont of bowel last noc ROS cannot obtain due to MS Objective: Vital Signs: Blood pressure 131/77, pulse 84, temperature 97.7 F (36.5 C), temperature source Oral, resp. rate 18, height 5\' 3"  (1.6 m), weight 66.6 kg (146 lb 13.2 oz), SpO2 98 %, unknown if currently breastfeeding. No results found. Results for orders placed or performed during the hospital encounter of 05/26/15 (from the past 72 hour(s))  Basic metabolic panel     Status: Abnormal   Collection Time: 05/29/15  5:40 AM  Result Value Ref Range   Sodium 137 135 - 145 mmol/L   Potassium 3.9 3.5 - 5.1 mmol/L    Comment: DELTA CHECK NOTED   Chloride 103 101 - 111 mmol/L   CO2 27 22 - 32 mmol/L   Glucose, Bld 95 65 - 99 mg/dL   BUN <5 (L) 6 - 20 mg/dL   Creatinine, Ser 05/31/15 (L) 0.44 - 1.00 mg/dL   Calcium 8.6 (L) 8.9 - 10.3 mg/dL   GFR calc non Af Amer >60 >60 mL/min   GFR calc Af Amer >60 >60 mL/min    Comment: (NOTE) The eGFR has been calculated using the CKD EPI equation. This calculation has not been validated in all clinical situations. eGFR's persistently <60 mL/min signify possible Chronic Kidney Disease.    Anion gap 7 5 - 15     HEENT: normal Cardio: RRR and no murmur Resp: CTA B/L and unlabored GI: BS positive and NT, ND Extremity:  BS positive and NT, ND Skin:   Central line site CDI Neuro: Alert/Oriented, Abnormal Sensory reduced sensation to pinch RUE, Abnormal Motor 0/5 RUE, Trace knee ext RLE and Aphasic Musc/Skel:  Other no pain with UE or LE ROM Gen NAD   Assessment/Plan: 1. Functional deficits secondary to  Right flaccid hemiplegia,dysphagia, and aphasia secondary to large left parietal intracranial hemorrhage which require 3+ hours per day of interdisciplinary therapy in a comprehensive inpatient rehab setting. Physiatrist is providing close team supervision and 24 hour management of active medical  problems listed below. Physiatrist and rehab team continue to assess barriers to discharge/monitor patient progress toward functional and medical goals.  Team conference today please see physician documentation under team conference tab, met with team face-to-face to discuss problems,progress, and goals. Formulized individual treatment plan based on medical history, underlying problem and comorbidities. FIM:    Function- Upper Body Dressing/Undressing What is the patient wearing?: Put head through opening of pull over shirt/dress Function - Lower Body Dressing/Undressing Lower body dressing/undressing activity did not occur: 1: Total-Patient completed less than 25% of tasks What is the patient wearing?: Thread/unthread left pants leg  Function - Toileting Toileting activity did not occur: No continent bowel/bladder event Toileting steps completed by patient: Adjust clothing prior to toileting, Performs perineal hygiene, Adjust clothing after toileting Toileting Assistive Devices: Grab bar or rail for support  Function - 3.88 transfer assistive device: Elevated toilet seat  Function - Chair/bed transfer Chair/bed transfer assist level: 2 helpers Chair/bed transfer assistive device: Armrests Chair/bed transfer details: Visual cues/gestures for precautions/safety, Verbal cues for technique, Verbal cues for precautions/safety, Verbal cues for safe use of DME/AE, Manual facilitation for weight shifting, Manual facilitation for placement, Manual facilitation for weight bearing     Function - Comprehension Comprehension: Auditory Comprehension assist level: Understands basic 25 - 49% of the time/ requires cueing 50 - 75% of the time  Function - Expression  Expression: Verbal Expression assist level: Expresses basic 50 - 74% of the time/requires cueing 25 - 49% of the time. Needs to repeat parts of sentences.  Function - Social Interaction Social Interaction assist  level: Interacts appropriately 25 - 49% of time - Needs frequent redirection.  Function - Problem Solving Problem solving assist level: Solves basic less than 25% of the time - needs direction nearly all the time or does not effectively solve problems and may need a restraint for safety  Function - Memory Memory assist level: Recognizes or recalls 25 - 49% of the time/requires cueing 50 - 75% of the time Patient normally able to recall (first 3 days only): That he or she is in a hospital  Medical Problem List and Plan: 1. Functional deficits secondary to Right flaccid hemiplegia,dysphagia, and aphasia secondary to large left parietal intracranial hemorrhage, also severe cog def 2. DVT Prophylaxis/Anticoagulation: Pharmaceutical: Lovenox 3. Pain Management: Tylenol prn 4. Mood: LCSW to follow for evaluation and support as cognition/aphasia improves.  5. Neuropsych: This patient is not capable of making decisions on her own behalf. 6. Skin/Wound Care: Routine pressure relief measures.  7. Fluids/Electrolytes/Nutrition: Monitor I/O. 75% breakfast on 8/17 8. Insomnia?:and agitation , didn't receive trazodone on Monday, make  Trazodone prn, cont qhs  seroquel 9. New onset seizures: On keppra bid.check valproate level, seizure free, may d/c central line 10. HTN: Monitor BP every 8 hours. Being treated prn.   11. Urinary frequency Foley removed just today, she does not appear to be retaining however may have UTI , check urine culture LOS (Days) 4 A FACE TO FACE EVALUATION WAS PERFORMED  Mallory Schaad E 05/30/2015, 10:10 AM

## 2015-05-30 NOTE — Progress Notes (Signed)
Occupational Therapy Session Note  Patient Details  Name: Ashley [name] MRN: [id number] Date of Birth: Feb 13, 1976  Today's Date: 05/30/2015 OT Individual Time: 0800-0900 OT Individual Time Calculation (min): 60 min    Short Term Goals: Week 1:  OT Short Term Goal 1 (Week 1): Pt. will feed self with LUE with supervision OT Short Term Goal 2 (Week 1): Pt. sill maintain sustained atteniton for 2 minutes during grooming activity OT Short Term Goal 3 (Week 1): Pt. will bath self with max assist OT Short Term Goal 4 (Week 1): Pt. will dress UB with max assist OT Short Term Goal 5 (Week 1): Pt. will transfer to toilet with max assist  Skilled Therapeutic Interventions/Progress Updates:    1:1 self care retraining at shower level. See below for functional progress in ADL tasks in session. Focus on static and dynamic sitting balance on different surfaces including EOB, w/c, toilet and tub bench in the shower with varying levels of assist from close supervision to mod A. Continued focus on visual attention to the left with min multimodal cuing in context of functional tasks. Transfer training with transfers to the left and right with extra time and mod A +2 to bed, w/c, toilet and tub bench. Pt able to follow basic one step commands related to familiar and basic self care tasks. Hemi dressing with max cuing for sequencing and task organization. PT able perform sit to stand with max A +2 with support at right hip and knee for control and for right foot to maintain contact with the foot. Noted clonus in the right LE as long as tone in UE and LE. [name] present for session and supportive. Pt with increased receptive language compared to expressive. Pt demonstrate intellectual awareness of boyd deficits with extra time in the midst of an activity and with its difficulty.   Therapy Documentation Precautions:  Precautions Precautions: Fall Precaution Comments: RUE flexortone Restrictions Weight  Bearing Restrictions: No Pain: Pain Assessment Pain Assessment: No/denies pain Faces Pain Scale: Hurts even more Pain Type: Acute pain Pain Location: Head Pain Descriptors / Indicators: Aching Pain Intervention(s): Medication (See eMAR) ADL:   Exercises:   Other Treatments:    Function:   Eating Eating               Grooming Oral Care,Brush Teeth, Clean Dentures Activity:      Assist Level: More than reasonable amount of time;Supervision or verbal cues;Touching or steadying assistance(Pt > 75%);Set up   Set up : To obtain items;To open containers;To adjust water temperature  Wash, Rinse, Dry Face Activity   Assist Level: Set up;Supervision or verbal cues   Set up : To obtain items;To open containers;To adjust water temperature  Wash, Rinse, Dry Hands Activity   Assist Level: Helper performed activity      Brush, Comb Hair Activity   Assist Level: Helper performed activity    Shave Activity Shave activity did not occur: N/A        Apply Makeup Activity Apply makeup activity did not occur: N/A                                                           Bathing Bathing position   Position: Shower  Bathing parts Body parts bathed by patient: Chest;Abdomen;Front perineal area;Right upper  leg Body parts bathed by helper: Right arm;Left arm;Buttocks;Left upper leg;Right lower leg;Left lower leg;Back  Bathing assist Assist Level: Set up;Supervision or verbal cues;Touching or steadying assistance(Pt > 75%);2 helpers   Set up : To open containers;To obtain items;To adjust water temperature   Upper Body Dressing/Undressing Upper body dressing   What is the patient wearing?: Pull over shirt/dress     Pull over shirt/dress - Perfomed by patient: Put head through opening;Thread/unthread left sleeve Pull over shirt/dress - Perfomed by helper: Thread/unthread right sleeve;Pull shirt over trunk        Upper body assist Assist Level: Touching or steadying  assistance(Pt > 75%)       Lower Body Dressing/Undressing Lower body dressing   What is the patient wearing?: Underwear;Pants;Socks;Shoes   Underwear - Performed by helper: Thread/unthread right underwear leg;Thread/unthread left underwear leg;Pull underwear up/down Pants- Performed by patient: Thread/unthread left pants leg Pants- Performed by helper: Thread/unthread right pants leg;Fasten/unfasten pants     Socks - Performed by patient: Don/doff left sock Socks - Performed by helper: Don/doff right sock Shoes - Performed by patient: Don/doff left shoe Shoes - Performed by helper: Don/doff right shoe;Fasten right;Fasten left          Lower body assist Assist Level: 2 Helpers;Touching or steadying assistance (Pt > 75%);Supervision or verbal cues;Set up   Set up : To obtain clothing/put away   Toileting Toileting     Toileting steps completed by helper: Adjust clothing prior to toileting;Performs perineal hygiene;Adjust clothing after toileting Toileting Assistive Devices: Grab bar or rail  Toileting assist Assist level: Two helpers    Bed Mobility Roll left and right activity      Sit to lying activity   Assist level: Max assist (Pt 25 - 49%)  Lying to sitting activity   Assist level: Maximal assist (Pt 25 - 49%)  Mobility details Bed mobility details: Verbal cues for techniques;Verbal cues for safe use of DME/AE;Verbal cues for sequencing;Verbal cues for precautions/safety;Manual facilitation for weight shifting;Manual facilitation for placement;Manual facilitation for weight bearing   Transfers Sit to stand transfer        Chair/bed transfer   Chair/bed transfer method: Squat pivot Chair/bed transfer assist level: 2 helpers Chair/bed transfer assistive device: Armrests   Chair/bed transfer details: Visual cues/gestures for precautions/safety;Verbal cues for technique;Verbal cues for precautions/safety;Verbal cues for safe use of DME/AE;Manual facilitation for  weight shifting;Manual facilitation for placement;Manual facilitation for weight bearing  Toilet transfer   Toilet transfer assistive device: Grab bar       Assist level to toilet: 2 helpers Assist level from toilet: 2 helpers  [place] transfer   [place] device: Tub transfer bench;Grab bars;Walk in shower   Assist level into tub: 2 helpers Assist level out of tub: 2 helpers   Cognition Comprehension Comprehension assist level: Understands basic 25 - 49% of the time/ requires cueing 50 - 75% of the time  Expression Expression assist level: Expresses basic 25 - 49% of the time/requires cueing 50 - 75% of the time. Uses single words/gestures.  Social Interaction Social Interaction assist level: Interacts appropriately 25 - 49% of time - Needs frequent redirection.  Problem Solving Problem solving assist level: Solves basic 25 - 49% of the time - needs direction more than half the time to initiate, plan or complete simple activities  Memory Memory assist level: Recognizes or recalls 25 - 49% of the time/requires cueing 50 - 75% of the time    Therapy/Group: Individual Therapy  Willeen Cass  [place] 05/30/2015, 1:21 PM

## 2015-05-30 NOTE — Progress Notes (Signed)
Speech Language Pathology Daily Session Note  Patient Details  Name: Ashley [name] MRN: [id number] Date of Birth: [birth year]  Today's Date: 05/30/2015 SLP Individual Time: 0900-1000 SLP Individual Time Calculation (min): 60 min  Short Term Goals: Week 1: SLP Short Term Goal 1 (Week 1): Pt will consume her currently prescribed diet with min verbal cues for use of swallowing precautions.   SLP Short Term Goal 2 (Week 1): Pt will consume trials of advanced consistencies with min verbal cues for use of swallowing precautions over 2 targeted sessions prior to diet advancement.   SLP Short Term Goal 3 (Week 1): Pt will monitor and correct verbosity and/or verbal errors with max assist multimodal cues during functional communication with SLP.   SLP Short Term Goal 4 (Week 1): Pt will name basic, familiar objects during structured tasks for >50% accuracy with max assist multimodal cues.   SLP Short Term Goal 5 (Week 1): Pt will follow 2-step commands during a functional task wtih mod assist multimodal cues.  SLP Short Term Goal 6 (Week 1): Pt will sustain attention to a functional task for ~5 minute intervals with mod verbal and visual cues for redirection.    Skilled Therapeutic Interventions:  Pt was seen for skilled ST targeting cognitive-linguistic goals.  Upon arrival, pt was seated upright in wheelchair, awake, lethargic, and required min encouragement to participate in Ashley [name].  Pt's newborn son and husband were in the room with pt.  Pt was unable to verbalize her son's name but she did appear to recognize his name and nodded yes when SLP said it.  Pt was transported to [place] treatment room to address goals in a minimally distracting environment.  Pt was able to match object to word from a field of 2 in 3 out of 5 opportunities with overall max assist multimodal cues to maintain alertness throughout task.  Pt was also stimulable for production of real words in isolation during various phrase completion tasks  within familiar songs.  Pt's accuracy of production varied with repetition of trials which SLP suspects to be related to motor planning deficits and fatigue.  SLP also facilitated the session with a basic calendar task targeting functional problem solving and sustained attention.  Pt placed number cards into a wall calendar in sequential order with max assist faded to min assist verbal and visual cues for visual scanning to the right of midline.  Pt sustained attention to task for ~2 minute intervals with min-mod assist multimodal cues for redirection and to maintain alertness during task.  Pt was returned to room and transferred to bed with assistance from RN and nurse tech.  Husband present at bedside.  Continue per current plan of care.      Function:  Eating Eating                 Cognition Comprehension Comprehension assist level: Understands basic 25 - 49% of the time/ requires cueing 50 - 75% of the time  Expression   Expression assist level: Expresses basic 50 - 74% of the time/requires cueing 25 - 49% of the time. Needs to repeat parts of sentences.  Social Interaction Social Interaction assist level: Interacts appropriately 25 - 49% of time - Needs frequent redirection.  Problem Solving Problem solving assist level: Solves basic 50 - 74% of the time/requires cueing 25 - 49% of the time  Memory Memory assist level: Recognizes or recalls 25 - 49% of the time/requires cueing 50 - 75% of the time  Pain Pain Assessment Pain Assessment: No/denies pain  Therapy/Group: Individual Therapy  Ashley [name], Ashley [name] 05/30/2015, 12:27 PM

## 2015-05-30 NOTE — Progress Notes (Signed)
Physical Therapy Session Note  Patient Details  Name: Ashley [name] MRN: [id number] Date of Birth: July 13, 1976  Today's Date: 05/30/2015 PT Individual Time: 1601-0932 PT Individual Time Calculation (min): 90 min   Short Term Goals: Week 1:  PT Short Term Goal 1 (Week 1): Pt will perform bed mobility at mod A level with 50% cues to attend to RUE/LE PT Short Term Goal 2 (Week 1): Pt will perform functional transfers to the R and L at mod A level with 50% cues to attend to RUE/LE PT Short Term Goal 3 (Week 1): Pt will perform sit<>stand at max A of single therapist  PT Short Term Goal 4 (Week 1): Pt will propel w/c using L hemi technique at S level x 150'   Skilled Therapeutic Interventions/Progress Updates:    Pt received in recliner with quick release belt in place - mother present, and husband arrived shortly thereafter for the duration of the session. Therapeutic Activity: see details below. PT instructs pt in blocked practice rolling L (mod A with pt facilitating at trunk; PT facilitating at R scapular and R knee in hook lie) and rolling R (SBA) x 5 reps each direction. Pt is a strong pusher with L leg > L arm to the R, therefore transfers towards pt's R side are easier than transfers to her L side, but both req +2 assist. After toileting, RN requests PT and Tech transfer pt back to recliner - pillows placed for support and pt left with RN and family present. W/C Propulsion - initially req visual and hand over leg assist at pt's L foot using L hemi technique x 75', progressing to intermittent min A-SBA with mod verbal cues x 200'. Gait Training - see below for details. PT facilitates R LE in swing (flexor tone inhibits foot contact on ground and req manual assist to overcome this) and blocks R knee in stance from buckling - as pt continues ambulating, she demonstrates increasingly stronger pushing tendency with L UE/LE - PT attempts to facilitate L elbow flexion on wall rail to reduce pushing, but no  carryover noted. Neuromuscular Reeducation - PT instructs pt in dynamic standing balance activity eob - weight shifts towards L side - visual cues to hit pt's L shoulder to Tech's R shoulder, then pt's L hip to Tech's R hip, progressing to L shoulder & L hip to Tech's R shoulder & R hip - significant difficulty for pt completing this activity. PT repeats this activity in front of wall mirror for a visual cue and pt participates much more in weight shifting over L LE.   Pt is very involved with significant communication difficulties due to expressive > receptive aphasia. 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 Pt demonstrated good carryover of w/c propulsion teaching, but also demonstrating R side inattention. PT educates pt's husband to talk to pt on her R side in order to call attention to this side. Husband verbalizes understanding. Continue per PT POC.   Therapy Documentation Precautions:  Precautions Precautions: Fall Precaution Comments: RUE flexortone Restrictions Weight Bearing Restrictions: No Vital Signs: Therapy Vitals Pulse Rate: 68 BP: 123/70 mmHg Patient Position (if appropriate): Sitting Oxygen Therapy SpO2: 100 % O2 Device: Not Delivered Pain: Pain Assessment Pain Assessment: Faces Pain Score: 8  Faces Pain Scale: Hurts even more Pain Type: Acute pain Pain Location: Head Pain Descriptors / Indicators: Aching Pain Onset: Gradual Pain Intervention(s): Repositioned Multiple Pain Sites: No   Function:  Child psychotherapist  Assistive Devices: Grab bar or rail   Bed Mobility Roll left and right activity   Assist level: Mod assist (Pt 50 - 74%, lift 2 legs) (to the L)  Sit to lying activity   Assist level: Mod assist (Pt 50 - 74%, lift 2 legs)  Lying to sitting activity   Assist level: Moderate assist (Pt 50 - 74%, lift 2 legs)  Mobility details Bed mobility details: Manual facilitation for placement;Verbal cues for techniques;Visual cues/gestures  for sequencing   Transfers Sit to stand transfer   Sit to stand assist level: Maximal assist (Pt 25 - 49%/lift and lower) Sit to stand assistive device: Other (no AD)  Chair/bed transfer   Chair/bed transfer method: Stand pivot Chair/bed transfer assist level: 2 helpers Chair/bed transfer assistive device: Armrests   Chair/bed transfer details: Manual facilitation for placement;Manual facilitation for weight shifting;Visual cues/gestures for sequencing;Verbal cues for sequencing;Verbal cues for technique   Toilet transfer   Toilet transfer assistive device: Grab bar;Elevated toilet seat/BSC over toilet    Car transfer          Locomotion Ambulation     Max distance: 30 Assist level: 2 helpers  Walk 10 feet activity   Assist level: 2 helpers  Walk 50 feet with 2 turns activity Walk 50 feet with 2 turns activity did not occur: Safety/medical concerns    Walk 150 feet activity      Walk 10 feet on uneven surfaces activity      Stairs          Walk up/down 1 step activity        Walk up/down 4 steps activity      Walk up/down 12 steps activity      Pick up small objects from floor      Wheelchair   Type: Manual Max wheelchair distance: 200 Assist Level: Touching or steadying assistance (Pt > 75%)  Wheel 50 feet with 2 turns activity   Assist Level: Touching or steadying assistance (Pt > 75%)  Wheel 150 feet activity   Assist Level: Touching or steadying assistance (Pt > 75%)   Cognition Comprehension Comprehension assist level: Understands basic 25 - 49% of the time/ requires cueing 50 - 75% of the time  Expression Expression assist level: Expresses basis less than 25% of the time/requires cueing >75% of the time.  Social Interaction Social Interaction assist level: Interacts appropriately 25 - 49% of time - Needs frequent redirection.  Problem Solving Problem solving assist level: Solves basic less than 25% of the time - needs direction nearly all the time or  does not effectively solve problems and may need a restraint for safety  Memory Memory assist level: Recognizes or recalls less than 25% of the time/requires cueing greater than 75% of the time    Therapy/Group: Individual Therapy with Dannielle Huh, PT Tech as +2  Theodoro Clock M 05/30/2015, 4:54 PM

## 2015-05-31 ENCOUNTER — Inpatient Hospital Stay (HOSPITAL_COMMUNITY): Payer: BLUE CROSS/BLUE SHIELD | Admitting: Occupational Therapy

## 2015-05-31 ENCOUNTER — Inpatient Hospital Stay (HOSPITAL_COMMUNITY): Payer: BLUE CROSS/BLUE SHIELD | Admitting: Physical Therapy

## 2015-05-31 ENCOUNTER — Inpatient Hospital Stay (HOSPITAL_COMMUNITY): Payer: BLUE CROSS/BLUE SHIELD | Admitting: Speech Pathology

## 2015-05-31 DIAGNOSIS — T8351XA Infection and inflammatory reaction due to indwelling urinary catheter, initial encounter: Secondary | ICD-10-CM

## 2015-05-31 DIAGNOSIS — N390 Urinary tract infection, site not specified: Secondary | ICD-10-CM

## 2015-05-31 MED ORDER — LEVETIRACETAM 500 MG PO TABS
1000.0000 mg | ORAL_TABLET | Freq: Two times a day (BID) | ORAL | Status: DC
  Administered 2015-05-31 – 2015-06-01 (×3): 1000 mg via ORAL
  Filled 2015-05-31 (×3): qty 2

## 2015-05-31 MED ORDER — TIZANIDINE HCL 4 MG PO TABS
4.0000 mg | ORAL_TABLET | Freq: Every day | ORAL | Status: DC
  Administered 2015-05-31: 4 mg via ORAL
  Filled 2015-05-31: qty 1

## 2015-05-31 MED ORDER — CEPHALEXIN 250 MG PO CAPS
250.0000 mg | ORAL_CAPSULE | Freq: Three times a day (TID) | ORAL | Status: DC
  Administered 2015-05-31 – 2015-06-01 (×5): 250 mg via ORAL
  Filled 2015-05-31 (×5): qty 1

## 2015-05-31 NOTE — Progress Notes (Addendum)
Physical Therapy Session Note  Patient Details  Name: Ashley [name] MRN: [id number] Date of Birth: 1976-04-13  Today's Date: 05/31/2015 PT Individual Time: 1100-1205 PT Individual Time Calculation (min): 65 min   Short Term Goals: Week 1:  PT Short Term Goal 1 (Week 1): Pt will perform bed mobility at mod A level with 50% cues to attend to RUE/LE PT Short Term Goal 2 (Week 1): Pt will perform functional transfers to the R and L at mod A level with 50% cues to attend to RUE/LE PT Short Term Goal 3 (Week 1): Pt will perform sit<>stand at max A of single therapist  PT Short Term Goal 4 (Week 1): Pt will propel w/c using L hemi technique at S level x 150'   Skilled Therapeutic Interventions/Progress Updates:    Pt received up in w/c with quick release belt in place and RN present. Pt agreeable to PT session. W/C Management - see below for details. Pt initially forgot how to coordinate L leg and L arm in hemi-technique self propelling manual w/c, but after practice x 75' with PT assisting L LE, pt's coordination improves to where it was the previous day and demonstrates x 150' with verbal cues to "look up" as pt gets close to R side of wall or obstacles on R (due to R inattention). Gait Training - see below for details. PT facilitates R swing phase and R foot placement due to strong flexor tone, include visual cues to lean L in L stance - pt demonstrates much less pushing to R with wall rail. PT progresses this to include ambulation with L HW x 8' + 20' req max A-tot A from PT due to strong pushing tendency with L HW, poor sequencing (despite visual demonstration of 3 point gait pattern prior to ambulation, and verbal & tactile cues for sequencing during gait), and assist needed to progress R LE in swing and for R foot placement on ground in R stance x 2 reps. W/C follow for safety. Neuromuscular Reeducation - PT instructs pt in dynamic standing balance with mirror feedback - placing horseshoes on and  removing them from tall basketball hoop on L - focus on leaning L and reorienting standing posture/balance to midline. Therapeutic Activity - See below for details re: toilet transfer and transfers. Pt ended in bed with RN in room planning on bladder scanning pt.   Pt very fatigued throughout PT session, but given rest breaks and willing to work. Pt initially very frustrated with initiation of ambulation with HW, but on second bout tolerates a longer distance. Midline sitting position/balance is improving in w/c, as well as standing balance, but with reduction in AD, pusher tendency is re-emerging strongly. Continue per PT POC.   Therapy Documentation Precautions:  Precautions Precautions: Fall Precaution Comments: RUE flexortone Restrictions Weight Bearing Restrictions: No Pain: Pain Assessment Pain Assessment: Faces Pain Score: 6  Faces Pain Scale: No hurt Pain Type: Acute pain Pain Location: Leg Pain Orientation: Right Pain Onset: With Activity (with ambulation) Pain Intervention(s): Rest Multiple Pain Sites: No  Function:  Herbalist Devices: Grab bar or rail   Bed Mobility Roll left and right activity      Sit to lying activity      Lying to sitting activity      Mobility details     Transfers Sit to stand transfer   Sit to stand assist level: Maximal assist (Pt 25 - 49%/lift and lower) Sit to stand assistive device:  Armrests  Chair/bed transfer   Chair/bed transfer method: Ambulatory Chair/bed transfer assist level: 2 helpers Chair/bed transfer assistive device: Other (wall rail)   Chair/bed transfer details: Manual facilitation for weight shifting;Manual facilitation for placement;Visual cues/gestures for sequencing;Visual cues/gestures for precautions/safety;Verbal cues for sequencing;Verbal cues for technique   Toilet transfer   Toilet transfer assistive device: Elevated toilet seat/BSC over toilet;Grab bar    Car transfer           Locomotion Ambulation     Max distance: 30 Assist level: 2 helpers  Walk 10 feet activity   Assist level: 2 helpers  Walk 50 feet with 2 turns activity      Walk 150 feet activity      Walk 10 feet on uneven surfaces activity      Stairs          Walk up/[name] 1 step activity        Walk up/[name] 4 steps activity      Walk up/[name] 12 steps activity      Pick up small objects from floor      Wheelchair   Type: Manual Max wheelchair distance: 150 Assist Level: Supervision or verbal cues  Wheel 50 feet with 2 turns activity   Assist Level: Supervision or verbal cues  Wheel 150 feet activity   Assist Level: Supervision or verbal cues   Cognition Comprehension Comprehension assist level: Understands basic 25 - 49% of the time/ requires cueing 50 - 75% of the time  Expression Expression assist level: Expresses basic 50 - 74% of the time/requires cueing 25 - 49% of the time. Needs to repeat parts of sentences.  Social Interaction Social Interaction assist level: Interacts appropriately 50 - 74% of the time - May be physically or verbally inappropriate.  Problem Solving Problem solving assist level: Solves basic 25 - 49% of the time - needs direction more than half the time to initiate, plan or complete simple activities  Memory Memory assist level: Recognizes or recalls 25 - 49% of the time/requires cueing 50 - 75% of the time    Therapy/Group: Individual Therapy with [facility], PT Tech as +2  Jen Eppinger M 05/31/2015, 12:13 PM

## 2015-05-31 NOTE — Progress Notes (Signed)
Social Work Assessment and Plan Social Work Assessment and Plan  Patient Details  Name: Ashley [name] MRN: [id number] Date of Birth: May 05, 1976  Today's Date: 05/31/2015  Problem List:  Patient Active Problem List   Diagnosis Date Noted  . Right spastic hemiparesis 05/28/2015  . 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 History of anxiety disorder 05/28/2015  . ICH (intracerebral hemorrhage)   . Seizure disorder, nonconvulsive, with status epilepticus   . Cerebral venous thrombosis of cortical vein   . Cytotoxic cerebral edema   . IVH (intraventricular hemorrhage)   . Term pregnancy 05/07/2015  . Spontaneous vaginal delivery 05/07/2015   Past Medical History:  Past Medical History  Diagnosis Date  . Anxiety    Past Surgical History:  Past Surgical History  Procedure Laterality Date  . Dilation and curettage of uterus     Social History:  reports that she has never smoked. She has never used smokeless tobacco. She reports that she drinks alcohol. She reports that she does not use illicit drugs.  Family / Support Systems Marital Status: Married How Long?: 3 years - 5 years together Patient Roles: Spouse, Parent, Other (Comment) (sister, dtr, employee) Spouse/Significant Other: Remo Lipps "[name]" Sheehy - husband - [phone] Children: Earnestine Mealing - 20 y/o son; Denton Ar - 107 y/o dtr; and Eulas Post - 55 week old son Other Supports: Jhonnie Garner - mother Anticipated Caregiver: [name] and Helene Kelp Ability/Limitations of Caregiver: husband is self employed with 15 employees.  He states he could take a month off if he needed to.  Also, pt's mom moved in to their home several months ago and plans to stay indefinitely.  She is assisting in caring for the baby .  Pt has joint custody of her other 2 children, ages 28 and 17 Caregiver Availability: 24/7 Family Dynamics: Pt has a lot of support.  There is some tension between pt's husband and her mother based on different goals  and perspectives in the situation.  Social History Preferred language: English Religion:  Read: Yes Write: Yes Employment Status: Employed Name of Employer: Location manager - [employer] Return to Work Plans: Pt was already on leave for the last three months of her pregnancy so that she could spend time with her children.  Family has no immediate plans for pt to return to work and husband is not concerned with this. Legal History/Current Legal Issues: none reported Guardian/Conservator: none at this time   Abuse/Neglect Physical Abuse: Denies Verbal Abuse: Denies Sexual Abuse: Denies Exploitation of patient/patient's resources: Denies Self-Neglect: Denies  Emotional Status Pt's affect, behavior and adjustment status: CSW is not able to assess this with pt at this time.  [name] advocated for her to be on her Prozac as she was on this prior to pregnancy, as she has a history of anxiety. Recent Psychosocial Issues: Pt delivered healthy female infant on 05-07-15.  Has tow other children from her previous marriage. Psychiatric History: hx of anxiety Substance Abuse History: none reported  Patient / Family Perceptions, Expectations & Goals Pt/Family understanding of illness & functional limitations: Family reports a good understanding of pt's condition and have had the opportunity to talk with medical and therapy team on a few occasions. Premorbid pt/family roles/activities: Pt was working per diem as a Architectural technologist.  She enjoys daily running and spending time with her children/family/friends. Anticipated changes in roles/activities/participation: Pt will have a long road to recovery and family recognizes that pt will need help with most tasks she was doing  at home and for herself.   Pt/family expectations/goals: Family would like for pt to get to where she can function at home with her family.  Community Resources Community Agencies: None Premorbid Home Care/DME  Agencies: None Transportation available at discharge: family Resource referrals recommended: Neuropsychology, Support group (specify)  Discharge Planning Living Arrangements: Spouse/significant other, Children, Parent (Mother) Support Systems: Spouse/significant other, Children, Parent, Other relatives, Friends/neighbors Type of Residence: Private residence Insurance Resources: Private Insurance (specify) (Blue Cross Blue Shield) Financial Resources: Employment, Family Support Financial Screen Referred: No Money Management: Patient, Spouse Does the patient have any problems obtaining your medications?: No Home Management: [name] had already assumed many of these tasks. Patient/Family Preliminary Plans: Pt's family plans to take turns being with pt at home after d/c. Barriers to Discharge: Steps Social Work Anticipated Follow Up Needs: HH/OP, Support Group Expected length of stay: 4 weeks  Clinical Impression CSW met with pt while her sister-in-law, Blair, was with her.  She helped to answer some of CSW's questions.  CSW later met with pt's husband to introduce self and explain role of CSW, as well as to complete assessment.  Husband is very supportive and ready and willing to do whatever pt needs.  He is also trying to advocate for pt's children and keeping in close communication with pt's ex-husband, father of pt's older children.  Pt has a 3 week old son with her current husband and pt's mother and husband's sisters are helping with the infant.  CSW offered support to husband and explained how CIR team can help him and pt's family through this process.  He is willing to do whatever pt needs.  One of his main concerns right now is the older children and when it is appropriate for them to come in to see their mother.  [name] and her ex-husband are in agreement on how to do this, but pt's mother thinks they are "keeping them from their mother."  Husband is trying to do what is best for  everyone.  CSW will continue to support family with this.  ,  Capps 05/31/2015, 10:07 PM    

## 2015-05-31 NOTE — Progress Notes (Signed)
Subjective/Complaints: Patient reportedly was less agitated last night. Seen during speech therapy session. Patient remains severely aphasic. Difficult to get yeses and no questions answered. Patient did say yes that she had spasms last night. No documentation in the nursing chart. ROS cannot obtain due to MS Objective: Vital Signs: Blood pressure 124/76, pulse 71, temperature 97.7 F (36.5 C), temperature source Oral, resp. rate 16, height $RemoveBe'5\' 3"'OnKAHkFhN$  (1.6 m), weight 66.6 kg (146 lb 13.2 oz), SpO2 99 %, unknown if currently breastfeeding. No results found. Results for orders placed or performed during the hospital encounter of 05/26/15 (from the past 72 hour(s))  Basic metabolic panel     Status: Abnormal   Collection Time: 05/29/15  5:40 AM  Result Value Ref Range   Sodium 137 135 - 145 mmol/L   Potassium 3.9 3.5 - 5.1 mmol/L    Comment: DELTA CHECK NOTED   Chloride 103 101 - 111 mmol/L   CO2 27 22 - 32 mmol/L   Glucose, Bld 95 65 - 99 mg/dL   BUN <5 (L) 6 - 20 mg/dL   Creatinine, Ser 0.39 (L) 0.44 - 1.00 mg/dL   Calcium 8.6 (L) 8.9 - 10.3 mg/dL   GFR calc non Af Amer >60 >60 mL/min   GFR calc Af Amer >60 >60 mL/min    Comment: (NOTE) The eGFR has been calculated using the CKD EPI equation. This calculation has not been validated in all clinical situations. eGFR's persistently <60 mL/min signify possible Chronic Kidney Disease.    Anion gap 7 5 - 15  Urinalysis, Routine w reflex microscopic (not at [facility])     Status: Abnormal   Collection Time: 05/30/15  4:28 PM  Result Value Ref Range   Color, Urine YELLOW YELLOW   APPearance TURBID (A) CLEAR   Specific Gravity, Urine 1.007 1.005 - 1.030   pH 8.0 5.0 - 8.0   Glucose, UA NEGATIVE NEGATIVE mg/dL   Hgb urine dipstick LARGE (A) NEGATIVE   Bilirubin Urine NEGATIVE NEGATIVE   Ketones, ur NEGATIVE NEGATIVE mg/dL   Protein, ur NEGATIVE NEGATIVE mg/dL   Urobilinogen, UA 0.2 0.0 - 1.0 mg/dL   Nitrite POSITIVE (A) NEGATIVE   Leukocytes, UA MODERATE (A) NEGATIVE  Urine microscopic-add on     Status: Abnormal   Collection Time: 05/30/15  4:28 PM  Result Value Ref Range   Squamous Epithelial / LPF RARE RARE   WBC, UA 11-20 <3 WBC/hpf   RBC / HPF 21-50 <3 RBC/hpf   Bacteria, UA MANY (A) RARE   Casts GRANULAR CAST (A) NEGATIVE   Urine-Other AMORPHOUS URATES/PHOSPHATES      HEENT: normal Cardio: RRR and no murmur Resp: CTA B/L and unlabored GI: BS positive and NT, ND Extremity:  BS positive and NT, ND Skin:   Central line site CDI Neuro: Alert/Oriented, Abnormal Sensory reduced sensation to pinch RUE, Abnormal Motor 0/5 RUE, Trace knee ext RLE and Aphasic, modified Ashworth score of 3 at the right biceps and right hamstrings Musc/Skel:  Other no pain with UE or LE ROM Gen NAD   Assessment/Plan: 1. Functional deficits secondary to  Right flaccid hemiplegia,dysphagia, and aphasia secondary to large left parietal intracranial hemorrhage which require 3+ hours per day of interdisciplinary therapy in a comprehensive inpatient rehab setting. Physiatrist is providing close team supervision and 24 hour management of active medical problems listed below. Physiatrist and rehab team continue to assess barriers to discharge/monitor patient progress toward functional and medical goals.  FIM: Function - Bathing Position:  Shower Body parts bathed by patient: Chest, Abdomen, Front perineal area, Right upper leg Body parts bathed by helper: Right arm, Left arm, Buttocks, Left upper leg, Right lower leg, Left lower leg, Back Assist Level: Set up, Supervision or verbal cues, Touching or steadying assistance(Pt > 75%), 2 helpers Set up : To open containers, To obtain items, To adjust water temperature  Function- Upper Body Dressing/Undressing What is the patient wearing?: Pull over shirt/dress Pull over shirt/dress - Perfomed by patient: Put head through opening, Thread/unthread left sleeve Pull over shirt/dress - Perfomed  by helper: Thread/unthread right sleeve, Pull shirt over trunk Assist Level: Touching or steadying assistance(Pt > 75%) Function - Lower Body Dressing/Undressing Lower body dressing/undressing activity did not occur: 1: Total-Patient completed less than 25% of tasks What is the patient wearing?: Underwear, Pants, Socks, Shoes Position: Wheelchair/chair at [employer] - Performed by helper: Thread/unthread right underwear leg, Thread/unthread left underwear leg, Pull underwear up/down Pants- Performed by patient: Thread/unthread left pants leg Pants- Performed by helper: Thread/unthread right pants leg, Fasten/unfasten pants Socks - Performed by patient: Don/doff left sock Socks - Performed by helper: Don/doff right sock Shoes - Performed by patient: Don/doff left shoe Shoes - Performed by helper: Don/doff right shoe, Fasten right, Fasten left Assist Level: 2 Helpers, Touching or steadying assistance (Pt > 75%), Supervision or verbal cues, Set up Set up : To obtain clothing/put away  Function - Toileting Toileting activity did not occur: No continent bowel/bladder event Toileting steps completed by patient: Adjust clothing prior to toileting, Performs perineal hygiene, Adjust clothing after toileting Toileting steps completed by helper: Adjust clothing prior to toileting, Performs perineal hygiene, Adjust clothing after toileting Toileting Assistive Devices: Grab bar or rail Assist level: Two helpers  Function - Air cabin crew transfer assistive device: Grab bar, Elevated toilet seat/BSC over toilet Assist level to toilet: 2 helpers Assist level from toilet: 2 helpers Assist level to bedside commode (at bedside): 2 helpers Assist level from bedside commode (at bedside): 2 helpers  Function - Chair/bed transfer Chair/bed transfer Maynard: Stand pivot Chair/bed transfer assist level: 2 helpers Chair/bed transfer assistive device: Armrests Chair/bed transfer details: Manual  facilitation for placement, Manual facilitation for weight shifting, Visual cues/gestures for sequencing, Verbal cues for sequencing, Verbal cues for technique  Function - Locomotion: Wheelchair Will patient use wheelchair at discharge?: Yes Type: Manual Max wheelchair distance: 200 Assist Level: Touching or steadying assistance (Pt > 75%) Assist Level: Touching or steadying assistance (Pt > 75%) Assist Level: Touching or steadying assistance (Pt > 75%) Function - Locomotion: Ambulation Assistive device: Rail in hallway, Other (comment) (L wall rail) Max distance: 30 Assist level: 2 helpers Assist level: 2 helpers Walk 50 feet with 2 turns activity did not occur: Safety/medical concerns  Function - Comprehension Comprehension: Auditory Comprehension assist level: Understands basic 25 - 49% of the time/ requires cueing 50 - 75% of the time  Function - Expression Expression: Verbal Expression assist level: Expresses basis less than 25% of the time/requires cueing >75% of the time.  Function - Social Interaction Social Interaction assist level: Interacts appropriately 25 - 49% of time - Needs frequent redirection.  Function - Problem Solving Problem solving assist level: Solves basic less than 25% of the time - needs direction nearly all the time or does not effectively solve problems and may need a restraint for safety  Function - Memory Memory assist level: Recognizes or recalls less than 25% of the time/requires cueing greater than 75% of the time Patient  normally able to recall (first 3 days only): That he or she is in a hospital  Medical Problem List and Plan: 1. Functional deficits secondary to Right flaccid hemiplegia,dysphagia, and aphasia secondary to large left parietal intracranial hemorrhage, also severe cog def, spasticity increasing now on Zanaflex 2. DVT Prophylaxis/Anticoagulation: Pharmaceutical: Lovenox 3. Pain Management: Tylenol prn 4. Mood: LCSW to follow for  evaluation and support as cognition/aphasia improves.  5. Neuropsych: This patient is not capable of making decisions on her own behalf. 6. Skin/Wound Care: Routine pressure relief measures.  7. Fluids/Electrolytes/Nutrition: Monitor I/O. 10-20% meals yesterday, about 240 cc fluid recorded. Check b met, may need nighttime fluids 8. Insomnia?:and agitation , didn't receive trazodone on Monday, make  Trazodone prn, cont qhs  seroquel 9. New onset seizures: On keppra bid.check valproate level, seizure free, may d/c central line 10. HTN: Monitor BP every 8 hours. Being treated prn.   11. Urinary frequency Foley, UA positive for UTI , check urine culture LOS (Days) 5 A FACE TO FACE EVALUATION WAS PERFORMED  Sophiagrace Benbrook E 05/31/2015, 9:26 AM

## 2015-05-31 NOTE — Progress Notes (Signed)
Speech Language Pathology Daily Session Note  Patient Details  Name: Ashley [name] MRN: [id number] Date of Birth: 05/11/1976  Today's Date: 05/31/2015 SLP Individual Time: 0800-0900 SLP Individual Time Calculation (min): 60 min  Short Term Goals: Week 1: SLP Short Term Goal 1 (Week 1): Pt will consume her currently prescribed diet with min verbal cues for use of swallowing precautions.   SLP Short Term Goal 2 (Week 1): Pt will consume trials of advanced consistencies with min verbal cues for use of swallowing precautions over 2 targeted sessions prior to diet advancement.   SLP Short Term Goal 3 (Week 1): Pt will monitor and correct verbosity and/or verbal errors with max assist multimodal cues during functional communication with SLP.   SLP Short Term Goal 4 (Week 1): Pt will name basic, familiar objects during structured tasks for >50% accuracy with max assist multimodal cues.   SLP Short Term Goal 5 (Week 1): Pt will follow 2-step commands during a functional task wtih mod assist multimodal cues.  SLP Short Term Goal 6 (Week 1): Pt will sustain attention to a functional task for ~5 minute intervals with mod verbal and visual cues for redirection.    Skilled Therapeutic Interventions:  Pt was seen for skilled ST targeting cognitive-linguistic goals.  Upon arrival, pt was transferring to wheelchair with assistance from nurse and nurse tech.  When seated upright in the chair, pt was awake, alert, and agreeable to participate in ST, which is a significant improvement in comparison to yesterday's therapy session.  [name] reported that pt slept better last night.  Pt was able to match word to object from a field of three with 100% accuracy and supervision cues.  Pt was able to name 4 out of 6 basic objects with overall max assist verbal and visual cues for awareness of verbal errors, which were characterized by perseverations of the syllable "choo."  Pt was noted with improved attention to the  right today in comparison to previous therapy sessions and pt was noted to spontaneously make eye contact with SLP who was seated on pt's right during structured and unstructured tasks.  Pt also initiated a verbal request to use the bathroom at the phrase level and was transferred to the toilet with assistance from SLP and nurse tech.  Pt required max to total safety cues during transfer due to pusher tendencies to the right.   At the end of today's session pt was transferred back to bed with SLP and nurse tech assistance.  Pt was left with bed alarm activated and call bell left within reach.  Continue per current plan of care.    Function:  Eating Eating                 Cognition Comprehension Comprehension assist level: Understands basic 25 - 49% of the time/ requires cueing 50 - 75% of the time  Expression   Expression assist level: Expresses basic 50 - 74% of the time/requires cueing 25 - 49% of the time. Needs to repeat parts of sentences.  Social Interaction Social Interaction assist level: Interacts appropriately 50 - 74% of the time - May be physically or verbally inappropriate.  Problem Solving Problem solving assist level: Solves basic 25 - 49% of the time - needs direction more than half the time to initiate, plan or complete simple activities  Memory Memory assist level: Recognizes or recalls 25 - 49% of the time/requires cueing 50 - 75% of the time    Pain Pain  Assessment Pain Assessment: Faces Faces Pain Scale: No hurt   Therapy/Group: Individual Therapy  Equan Cogbill, Selinda Orion 05/31/2015, 11:46 AM

## 2015-05-31 NOTE — Patient Care Conference (Signed)
Inpatient RehabilitationTeam Conference and Plan of Care Update Date: 05/30/2015   Time: 11:20 AM    Patient Name: Ashley [name]      Medical Record Number: [id number]  Date of Birth: 1975/11/22 Sex: Female         Room/Bed: 4W16C/4W16C-01 Payor Info: Payor: [place] / Plan: [employer]BS OTHER / Product Type: *No Product type* /    Admitting Diagnosis: Left  ICH  Admit Date/Time:  05/26/2015  3:38 PM Admission Comments: No comment available   Primary Diagnosis:  <principal problem not specified> Principal Problem: <principal problem not specified>  Patient Active Problem List   Diagnosis Date Noted  . Right spastic hemiparesis 05/28/2015  . 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 History of anxiety disorder 05/28/2015  . ICH (intracerebral hemorrhage)   . Seizure disorder, nonconvulsive, with status epilepticus   . Cerebral venous thrombosis of cortical vein   . Cytotoxic cerebral edema   . IVH (intraventricular hemorrhage)   . Term pregnancy 05/07/2015  . Spontaneous vaginal delivery 05/07/2015    Expected Discharge Date: Expected Discharge Date: 06/22/15  Team Members Present: Physician leading conference: Dr. Alysia Penna Social Worker Present: Alfonse Alpers, LCSW Nurse Present: Heather Roberts, RN PT Present: Guilford Shi, PT OT Present: Willeen Cass, OT SLP Present: Windell Moulding, SLP PPS Coordinator present : Daiva Nakayama, RN, CRRN     Current Status/Progress Goal Weekly Team Focus  Medical   poor sleep at night, aphasia, agitation, urinary retention improved  Home with family assist  Improve sleep, toileting program   Bowel/Bladder   Continent  and incontinent of bowel.  LBM 05/28/15. Foley removed. void trial  Managed bowel and bladder  Offer bladder assistance q 2-3 hrs   Swallow/Nutrition/ Hydration   dys 3 textures, thin liquids; full supervision for use of swallowing precautions due to cognition; husband, [name], signed off to  supervise during meals  supervision   trials of advanced consistencies    ADL's   Mod - Max A for BADL, Max A for transfers  Overall Min A for BADL, supervision for grooming/memory/sitting balance  Attention to right, NMR of RUE, sitting balance, memory/awareness/sequencing, transfers, family ed   Mobility   +2 assist for all functional mobility  min A  midline orientation, sit balance, sit to stands, transfers, gait as able (3 musketeering)   Communication   significant expressive>receptive aphasia, neologisms, perseveration, limited awareness of verbal errors.   min assist   identifying objects from a field of two, verbal expression during automatic sequences, awareness of verbal errors    Safety/Cognition/ Behavioral Observations  severe deficits, decreased alertness, right inattention, poor sustained attention, poor safety awareness   min assist   address basic cognition and safety awareness.     Pain   Tylenol 650mg  q 4hrs for headache  <3  Offer pain medication 1hr prior to therapy session, Assess for effectiveness   Skin   Rash on back and buttock improving, periarea red, irritated. Utilizing microguard powder  CDI  Assess q shift    Rehab Goals Patient on target to meet rehab goals: Yes Rehab Goals Revised: pt's first conference *[place] and progress notes for long and short-term goals.  Barriers to Discharge: severe weakness, cognitive def    Possible Resolutions to Barriers:  cont rehab, NM ed, speech for cognition    Discharge Planning/Teaching Needs:  Pt will return to her home with family members to assist with her care at home.  Husband, pt's mother,  and pt's sisters-in-law will come for family education when appropriate.   Team Discussion:  Pt is having agitation at night, especially.  MD to keep her on seroquel and has restarted her prozac.  He also has trazodone ordered and xanaflex for muscle spasms.  Pt's level of alertness is improving and getting her days  and nights straight will help.  She will need significant time on CIR to rehabilitate.  Pt showered and was awake during it.  She followed basic commands and talked a lot.  Pt is extremely fatigued/exhausted.  Her receptive language is better then her expressive language.  ST will work on increasing attention to the right and has great potential.  RN to take her to the bathroom every 2 hours.  Revisions to Treatment Plan:  none   Continued Need for Acute Rehabilitation Level of Care: The patient requires daily medical management by a physician with specialized training in physical medicine and rehabilitation for the following conditions: Daily direction of a multidisciplinary physical rehabilitation program to ensure safe treatment while eliciting the highest outcome that is of practical value to the patient.: Yes Daily medical management of patient stability for increased activity during participation in an intensive rehabilitation regime.: Yes Daily analysis of laboratory values and/or radiology reports with any subsequent need for medication adjustment of medical intervention for : Neurological problems;Other  Taija Mathias, [name] 05/31/2015, 10:22 PM

## 2015-05-31 NOTE — Progress Notes (Signed)
Occupational Therapy Session Note  Patient Details  Name: Ashley [name] MRN: [id number] Date of Birth: [birth year]  Today's Date: 05/31/2015 OT Individual Time:0930-1000 (Co-tx 9:30-1030) 30 min (Total time 60 min)  Short Term Goals: Week 1:  OT Short Term Goal 1 (Week 1): Pt. will feed self with LUE with supervision OT Short Term Goal 2 (Week 1): Pt. sill maintain sustained atteniton for 2 minutes during grooming activity OT Short Term Goal 3 (Week 1): Pt. will bath self with max assist OT Short Term Goal 4 (Week 1): Pt. will dress UB with max assist OT Short Term Goal 5 (Week 1): Pt. will transfer to toilet with max assist  Skilled Therapeutic Interventions/Progress Updates:  ADL retraining Co-Tx with focus on improved attention, communication, sitting balance, transfers, and NMR of RUE.   Pt received supine in bed and requiring max multimodal cues to arouse from sleep to participate in session.   Pt able to complete bed mobility and maintain sitting balance at EOB with overall mod assist and improved comprehension of instruction, selecting from several providers present (RN and 2 OTs).   Pt self-fed pudding with setup to facilitate use of RUE as gross assist to hold pudding cup and sustained conversation with therapist for 4 minuties, attempting to recall familiar names, situations and places to increase duration of conversation.   Pt transfers to w/c and from w/c to tub bench with +2 required for safety to inhibit right lateral lean during sit>stand and stand-pivot transfer. See functional assessment below for details on peformance of bathing/dressing with alternate provider.     Therapy Documentation Precautions:  Precautions Precautions: Fall Precaution Comments: RUE flexortone Restrictions Weight Bearing Restrictions: No   Pain: Pain Assessment Pain Assessment: Faces Pain Score: 6  Faces Pain Scale: No hurt Pain Type: Acute pain Pain Location: Leg Pain Orientation: Right Pain  Onset: With Activity (with ambulation) Pain Intervention(s): Rest Multiple Pain Sites: No  Function:   Eating Eating               Grooming Oral Care,Brush Teeth, Clean Dentures Activity:             Wash, Rinse, Dry Face Activity          Wash, Rinse, Dry Hands Activity          Brush, Comb Hair Activity   Assist Level: Helper performed activity    Shave Activity          Apply Makeup Activity                                                             Bathing Bathing position   Position: Shower  Bathing parts Body parts bathed by patient: Chest;Abdomen;Front perineal area;Right upper leg;Right arm;Left upper leg;Left lower leg Body parts bathed by helper: Buttocks;Back;Left arm  Bathing assist Assist Level: Set up;Supervision or verbal cues;Touching or steadying assistance(Pt > 75%);2 helpers   Set up : To open containers;To obtain items;To adjust water temperature   Upper Body Dressing/Undressing Upper body dressing   What is the patient wearing?: Pull over shirt/dress     Pull over shirt/dress - Perfomed by patient: Put head through opening;Thread/unthread left sleeve Pull over shirt/dress - Perfomed by helper: Thread/unthread right sleeve;Pull shirt over trunk  Upper body assist Assist Level: Touching or steadying assistance(Pt > 75%)       Lower Body Dressing/Undressing Lower body dressing   What is the patient wearing?: Pants;Non-skid slipper socks     Pants- Performed by patient: Thread/unthread left pants leg Pants- Performed by helper: Pull pants up/down;Thread/unthread right pants leg   Non-skid slipper socks- Performed by helper: Don/doff right sock;Don/doff left sock                  Lower body assist Assist Level: 2 Helpers;Touching or steadying assistance (Pt > 75%);Supervision or verbal cues       Toileting Toileting     Toileting steps completed by helper: Adjust clothing prior to toileting;Performs perineal  hygiene;Adjust clothing after toileting Toileting Assistive Devices: Grab bar or rail  Toileting assist Assist level: Two helpers    Bed Mobility Roll left and right activity   Assist level: Mod assist (Pt 50 - 74%, lift 2 legs)  Sit to lying activity   Assist level: Mod assist (Pt 50 - 74%, lift 2 legs)  Lying to sitting activity      Mobility details Bed mobility details: Manual facilitation for placement;Verbal cues for techniques;Visual cues/gestures for sequencing   Transfers Sit to stand transfer   Sit to stand assist level: Maximal assist (Pt 25 - 49%/lift and lower) Sit to stand assistive device: Armrests  Chair/bed transfer   Chair/bed transfer method: Ambulatory Chair/bed transfer assist level: 2 helpers Chair/bed transfer assistive device: Other (wall rail)   Chair/bed transfer details: Manual facilitation for weight shifting;Manual facilitation for placement;Visual cues/gestures for sequencing;Visual cues/gestures for precautions/safety;Verbal cues for sequencing;Verbal cues for technique  Toilet transfer   Toilet transfer assistive device: Elevated toilet seat/BSC over toilet;Grab bar Assist level to bedside commode (at bedside): 2 helpers Assist level from bedside commode (at bedside): 2 helpers        Tub/shower transfer   Holt device: Tub transfer bench;Grab bars;Walk in shower   Assist level into tub: 2 helpers;Maximal assist (Pt 25 - 49%/lift and lower) Assist level out of tub: 2 helpers;Maximal assist (Pt 25 - 49%/lift and lower)   Cognition Comprehension Comprehension assist level: Understands basic 25 - 49% of the time/ requires cueing 50 - 75% of the time  Expression Expression assist level: Expresses basic 50 - 74% of the time/requires cueing 25 - 49% of the time. Needs to repeat parts of sentences.  Social Interaction Social Interaction assist level: Interacts appropriately 50 - 74% of the time - May be physically or verbally  inappropriate.  Problem Solving Problem solving assist level: Solves basic 25 - 49% of the time - needs direction more than half the time to initiate, plan or complete simple activities  Memory Memory assist level: Recognizes or recalls 25 - 49% of the time/requires cueing 50 - 75% of the time    Therapy/Group: Individual Therapy    [place] 05/31/2015, 12:56 PM

## 2015-05-31 NOTE — Progress Notes (Signed)
[place] Individual Statement of Services  Patient Name:  Ashley [name]  Date:  05/31/2015  Welcome to the [facility].  Our goal is to provide you with an individualized program based on your diagnosis and situation, designed to meet your specific needs.  With this comprehensive rehabilitation program, you will be expected to participate in at least 3 hours of rehabilitation therapies Monday-Friday, with modified therapy programming on the weekends.  Your rehabilitation program will include the following services:  Physical Therapy (PT), Occupational Therapy (OT), Speech Therapy (ST), 24 hour per day rehabilitation nursing, Neuropsychology, Case Management (Social Worker), Rehabilitation Medicine, Nutrition Services and Pharmacy Services  Weekly team conferences will be held on Wednesdays to discuss your progress.  Your Social Worker will talk with you frequently to get your input and to update you on team discussions.  Team conferences with you and your family in attendance may also be held.  Expected length of stay:  4 weeks        Overall anticipated outcome:  Minimal assistance  Depending on your progress and recovery, your program may change. Your Social Worker will coordinate services and will keep you informed of any changes. Your Social Worker's name and contact numbers are listed  below.  The following services may also be recommended but are not provided by the [facility] will be made to provide these services after discharge if needed.  Arrangements include referral to agencies that provide these services.  Your insurance has been verified to be:  [employer] Your primary doctor is:  Dr. Marton Redwood  Pertinent information will be shared with your doctor and your  insurance company.  Social Worker:  Alfonse Alpers, LCSW  [phone] or [phone]  Information discussed with and copy given to patient by: Trey Sailors, 05/31/2015, 2:43 PM

## 2015-05-31 NOTE — Progress Notes (Signed)
Social Work Patient ID: Ashley [name], female   DOB: 1976-04-17, 39 y.o.   MRN: [id number]   CSW met with husband 05-30-15 to update him on team conference discussion, including targeted d/c date.  CSW also offered to husband to gather several members of team together to help pt's husband, pt's mother, and pt's ex-husband (father of pt's older children) as they try to navigate when/how to have pt's children visit her.  Husband appreciated this offer and we scheduled meeting for today at 3pm.  All of the above family members attended, as well as Junius Creamer, RN; Jerene Dilling, RN-Nursing Director; Windell Moulding, [place]; Clovia Cuff, OT; Lennart Pall, [place]; and this CSW.  The main focus of the meeting was how to best introduce visits between pt's older children and pt, but team members also provided an overview of brain injury and recovery.  It was decided that we would watch for pt to have several good days in a row, today being one of those.  When we see that, we will have children come in and talk with team to help prepare them for pt's condition.  Clovia Cuff suggested sharing a meal together or visiting in the family room so that it will be less intimidating than being in the hospital room.  [name] and ex-husband were very appreciative of the meeting and time spent with them.  [name] was quiet and did not ask any questions/make any comments during the meeting.  CSW shared information on [facility] and ways to talk with children about pt's condition, in addition to children's books that may help to have these difficult discussions.  It seems both husband and ex-husband have already done a great job talking with and supporting pt's 8y/o son and 6y/o dtr.  Other team members provided brain injury literature. Plan is to reassess how pt is doing at the beginning of next week with the hope of talking with children on Tuesday or Wednesday and having a visit.  Lennart Pall, LCSW will follow pt in this CSW's absence next  week, and will remain available as needed.

## 2015-05-31 NOTE — Progress Notes (Signed)
Physical Therapy Session Note  Patient Details  Name: Ashley [name] MRN: [id number] Date of Birth: 09/04/1976  Today's Date: 05/31/2015 PT Individual Time: 1335-1400 PT Individual Time Calculation (min): 25 min   Short Term Goals: Week 1:  PT Short Term Goal 1 (Week 1): Pt will perform bed mobility at mod A level with 50% cues to attend to RUE/LE PT Short Term Goal 2 (Week 1): Pt will perform functional transfers to the R and L at mod A level with 50% cues to attend to RUE/LE PT Short Term Goal 3 (Week 1): Pt will perform sit<>stand at max A of single therapist  PT Short Term Goal 4 (Week 1): Pt will propel w/c using L hemi technique at S level x 150'   Skilled Therapeutic Interventions/Progress Updates:   Session focused on functional transfers, R NMR and weightbearing, attention to R, midline orientation, and sitting/standing balance. Patient reporting need to urinate upon arrival. See below for details regarding functional mobility. Performed bed mobility and transferred wheelchair <> raised commode over toilet seat. Sit <> stand x 3 with use of grab bar and max A while patient completed hygiene and assisted with donning/doffing brief and pants. Hand hygiene from wheelchair level with mod cues to attend to and incorporate RUE. Patient's family arrived and patient requested to sit up in recliner. Performed squat pivot transfer with max A via [place] technique to facilitate anterior weight shift and therapist blocking RLE posteriorly due to RLE flexing behind patient. Patient left in recliner with BLE elevated and family present, notified family to call nursing before leaving for safety.   Therapy Documentation Precautions:  Precautions Precautions: Fall Precaution Comments: RUE flexortone Restrictions Weight Bearing Restrictions: No Pain: Denies pain  Function:  Herbalist Devices: Grab bar or rail   Bed Mobility Roll left and right activity      Sit  to lying activity      Lying to sitting activity   Assist level: Moderate assist (Pt 50 - 74%, lift 2 legs)  Mobility details Bed mobility details: Verbal cues for techniques;Visual cues/gestures for sequencing;Manual facilitation for weight shifting   Transfers Sit to stand transfer   Sit to stand assist level: Maximal assist (Pt 25 - 49%/lift and lower) Sit to stand assistive device: Armrests  Chair/bed transfer   Chair/bed transfer method: Squat pivot Chair/bed transfer assist level: Maximal assist (Pt 25 - 49%/lift and lower)     Chair/bed transfer details: Visual cues/gestures for sequencing;Visual cues/gestures for precautions/safety;Verbal cues for precautions/safety;Verbal cues for sequencing;Manual facilitation for weight shifting;Manual facilitation for placement   Toilet transfer   Toilet transfer assistive device: Elevated toilet seat/BSC over toilet;Grab bar    Car transfer            Cognition Comprehension Comprehension assist level: Understands basic 25 - 49% of the time/ requires cueing 50 - 75% of the time  Expression Expression assist level: Expresses basic 50 - 74% of the time/requires cueing 25 - 49% of the time. Needs to repeat parts of sentences.  Social Interaction Social Interaction assist level: Interacts appropriately 50 - 74% of the time - May be physically or verbally inappropriate.  Problem Solving Problem solving assist level: Solves basic 25 - 49% of the time - needs direction more than half the time to initiate, plan or complete simple activities  Memory Memory assist level: Recognizes or recalls 25 - 49% of the time/requires cueing 50 - 75% of the time    Therapy/Group:  Individual Therapy  Laretta Alstrom 05/31/2015, 5:47 PM

## 2015-05-31 NOTE — Progress Notes (Signed)
Occupational Therapy Session Note  Patient Details  Name: Ashley [name] MRN: [id number] Date of Birth: May 22, 1976  Today's Date: 05/31/2015 OT Co-Treatment Time: 1000-1030 (session time [phone]) OT Co-Treatment Time Calculation (min): 30 min   Short Term Goals: Week 1:  OT Short Term Goal 1 (Week 1): Pt. will feed self with LUE with supervision OT Short Term Goal 2 (Week 1): Pt. sill maintain sustained atteniton for 2 minutes during grooming activity OT Short Term Goal 3 (Week 1): Pt. will bath self with max assist OT Short Term Goal 4 (Week 1): Pt. will dress UB with max assist OT Short Term Goal 5 (Week 1): Pt. will transfer to toilet with max assist      Skilled Therapeutic Interventions/Progress Updates:    Pt seen during a co tx session with another OT for self care training at shower level with a focus on dynamicsitting balance, attention to R side, L weight shifting, and sit to stand skills using grab bar.  Pt did well following 1 step directions with bed mobility and transfers. She had good use of core muscles to assist her with sitting up and with reciprocal scooting. On tub bench, was able to sit with close S and mod a to wt shift to R to wash under L buttock. Cues to attend to R arm to wash.  Pt much more verbal and some of her sentences were intelligible. Good activity tolerance during the session.  Pt in w/c with quick release belt at end of session with nurse in room with pt.  Therapy Documentation Precautions:  Precautions Precautions: Fall Precaution Comments: RUE flexortone Restrictions Weight Bearing Restrictions: No    Vital Signs:   Pain: no c/o pain during OT session Pain Assessment Pain Assessment: Faces Pain Score: 6  Faces Pain Scale: No hurt Pain Type: Acute pain Pain Location: Leg Pain Orientation: Right Pain Onset: With Activity (with ambulation) Pain Intervention(s): Rest Multiple Pain Sites: No  Function:   Eating Eating                Grooming Oral Care,Brush Teeth, Clean Dentures Activity:             Wash, Rinse, Dry Face Activity          Wash, Rinse, Dry Hands Activity          Brush, Comb Hair Activity   Assist Level: Helper performed activity    Shave Activity          Apply Makeup Activity                                                             Bathing Bathing position   Position: Shower  Bathing parts Body parts bathed by patient: Chest;Abdomen;Front perineal area;Right upper leg;Right arm;Left upper leg;Left lower leg Body parts bathed by helper: Buttocks;Back;Left arm  Bathing assist Assist Level: Set up;Supervision or verbal cues;Touching or steadying assistance(Pt > 75%);2 helpers   Set up : To open containers;To obtain items;To adjust water temperature   Upper Body Dressing/Undressing Upper body dressing   What is the patient wearing?: Pull over shirt/dress     Pull over shirt/dress - Perfomed by patient: Put head through opening;Thread/unthread left sleeve Pull over shirt/dress - Perfomed by helper: Thread/unthread right sleeve;Pull shirt over trunk  Upper body assist Assist Level: Touching or steadying assistance(Pt > 75%)       Lower Body Dressing/Undressing Lower body dressing   What is the patient wearing?: Pants;Non-skid slipper socks     Pants- Performed by patient: Thread/unthread left pants leg Pants- Performed by helper: Pull pants up/down;Thread/unthread right pants leg   Non-skid slipper socks- Performed by helper: Don/doff right sock;Don/doff left sock                  Lower body assist Assist Level: 2 Helpers;Touching or steadying assistance (Pt > 75%);Supervision or verbal cues       Toileting Toileting     Toileting steps completed by helper: Adjust clothing prior to toileting;Performs perineal hygiene;Adjust clothing after toileting Toileting Assistive Devices: Grab bar or rail  Toileting assist Assist level: Two helpers    Bed  Mobility Roll left and right activity   Assist level: Mod assist (Pt 50 - 74%, lift 2 legs)  Sit to lying activity   Assist level: Mod assist (Pt 50 - 74%, lift 2 legs)  Lying to sitting activity      Mobility details Bed mobility details: Manual facilitation for placement;Verbal cues for techniques;Visual cues/gestures for sequencing   Transfers Sit to stand transfer   Sit to stand assist level: Maximal assist (Pt 25 - 49%/lift and lower) Sit to stand assistive device: Armrests  Chair/bed transfer   Chair/bed transfer method: Ambulatory Chair/bed transfer assist level: 2 helpers Chair/bed transfer assistive device: Other (wall rail)   Chair/bed transfer details: Manual facilitation for weight shifting;Manual facilitation for placement;Visual cues/gestures for sequencing;Visual cues/gestures for precautions/safety;Verbal cues for sequencing;Verbal cues for technique  Toilet transfer   Toilet transfer assistive device: Elevated toilet seat/BSC over toilet;Grab bar Assist level to bedside commode (at bedside): 2 helpers Assist level from bedside commode (at bedside): 2 helpers        Tub/shower transfer   [place] device: Tub transfer bench;Grab bars;Walk in shower   Assist level into tub: 2 helpers;Maximal assist (Pt 25 - 49%/lift and lower) Assist level out of tub: 2 helpers;Maximal assist (Pt 25 - 49%/lift and lower)   Cognition Comprehension Comprehension assist level: Understands basic 25 - 49% of the time/ requires cueing 50 - 75% of the time  Expression Expression assist level: Expresses basic 50 - 74% of the time/requires cueing 25 - 49% of the time. Needs to repeat parts of sentences.  Social Interaction Social Interaction assist level: Interacts appropriately 50 - 74% of the time - May be physically or verbally inappropriate.  Problem Solving Problem solving assist level: Solves basic 25 - 49% of the time - needs direction more than half the time to initiate,  plan or complete simple activities  Memory Memory assist level: Recognizes or recalls 25 - 49% of the time/requires cueing 50 - 75% of the time    Therapy/Group: Co-Treatment  [place] 05/31/2015, 12:26 PM

## 2015-06-01 ENCOUNTER — Inpatient Hospital Stay (HOSPITAL_COMMUNITY): Payer: BLUE CROSS/BLUE SHIELD

## 2015-06-01 ENCOUNTER — Inpatient Hospital Stay (HOSPITAL_COMMUNITY): Payer: BLUE CROSS/BLUE SHIELD | Admitting: Speech Pathology

## 2015-06-01 ENCOUNTER — Inpatient Hospital Stay (HOSPITAL_COMMUNITY): Payer: BLUE CROSS/BLUE SHIELD | Admitting: Physical Therapy

## 2015-06-01 DIAGNOSIS — I611 Nontraumatic intracerebral hemorrhage in hemisphere, cortical: Secondary | ICD-10-CM

## 2015-06-01 LAB — BASIC METABOLIC PANEL
Anion gap: 8 (ref 5–15)
BUN: 6 mg/dL (ref 6–20)
CALCIUM: 9.8 mg/dL (ref 8.9–10.3)
CO2: 29 mmol/L (ref 22–32)
CREATININE: 0.5 mg/dL (ref 0.44–1.00)
Chloride: 98 mmol/L — ABNORMAL LOW (ref 101–111)
GFR calc Af Amer: >60 mL/min (ref >60)
GFR calc non Af Amer: >60 mL/min (ref >60)
GLUCOSE: 99 mg/dL (ref 65–99)
Potassium: 4.1 mmol/L (ref 3.5–5.1)
Sodium: 135 mmol/L (ref 135–145)

## 2015-06-01 LAB — VALPROIC ACID LEVEL: Valproic Acid Lvl: <10 ug/mL — ABNORMAL LOW (ref 50.0–100.0)

## 2015-06-01 LAB — URINE CULTURE: Culture: >=100000

## 2015-06-01 MED ORDER — TIZANIDINE HCL 4 MG PO TABS
4.0000 mg | ORAL_TABLET | Freq: Every evening | ORAL | Status: DC | PRN
  Administered 2015-06-01 – 2015-06-02 (×2): 4 mg via ORAL
  Filled 2015-06-01 (×2): qty 1

## 2015-06-01 MED ORDER — POTASSIUM CHLORIDE CRYS ER 10 MEQ PO TBCR: 30.0000 meq | EXTENDED_RELEASE_TABLET | Freq: Every day | ORAL | Status: DC

## 2015-06-01 MED ORDER — VALACYCLOVIR HCL 500 MG PO TABS: 500.0000 mg | ORAL_TABLET | Freq: Three times a day (TID) | ORAL | Status: DC

## 2015-06-01 NOTE — Progress Notes (Signed)
Speech Language Pathology Daily Session Note  Patient Details  Name: Ashley [name] MRN: [id number] Date of Birth: [birth year]  Today's Date: 06/01/2015 SLP Individual Time: 0900-0930; 1401-1500 SLP Individual Time Calculation (min): 30 min; 59 min   Short Term Goals: Week 1: SLP Short Term Goal 1 (Week 1): Pt will consume her currently prescribed diet with min verbal cues for use of swallowing precautions.   SLP Short Term Goal 2 (Week 1): Pt will consume trials of advanced consistencies with min verbal cues for use of swallowing precautions over 2 targeted sessions prior to diet advancement.   SLP Short Term Goal 3 (Week 1): Pt will monitor and correct verbosity and/or verbal errors with max assist multimodal cues during functional communication with SLP.   SLP Short Term Goal 4 (Week 1): Pt will name basic, familiar objects during structured tasks for >50% accuracy with max assist multimodal cues.   SLP Short Term Goal 5 (Week 1): Pt will follow 2-step commands during a functional task wtih mod assist multimodal cues.  SLP Short Term Goal 6 (Week 1): Pt will sustain attention to a functional task for ~5 minute intervals with mod verbal and visual cues for redirection.    Skilled Therapeutic Interventions:  Session 1: Pt was seen for skilled ST targeting communication goals.  Upon arrival, pt was seated upright in reclined, awake, alert, and agreeable to participate in [place].  Pt utilized gestures and pointing in combination with verbalizations and mod assist verbal cues to indicate location of a pair of shorts that she wanted to wear.  Pt was able to name basic, familiar objects in first 3 out of 6 attempts.  Her last 3 attempts to name objects were characterized by perseveration of neologistic jargon.  Pt was also able to produce approximations of ~60% of words during short phrases of basic, familiar songs.  She remained perseverative when producing musical phrases, but less so than in  confrontational naming tasks.  Pt was returned to room and transferred to bed with assistance from RN, bed alarm activated, and call bell left within reach.  Continue per current plan of care.    Session 2: Pt was seen for skilled ST targeting cognitive goals.  Upon arrival, pt was reclined in bed, awake, flat affect, but agreeable to participate in [place].  SLP facilitated the session with a basic money management task targeting sustained attention, scanning to the right, and functional problem solving.  Pt sorted coins into 4 groups by value with min assist verbal and visual cues for thought organization and working memory.  She sustained attention to task for its duration (~5 minutes) in a minimally distracting environment with supervision.  Pt was able to count coins up to the number 6 before becoming perseverative, which is an improvement from the morning's session.  SLP increased task complexity with a picture sorting task.  Pt required max assist multimodal cues for sequencing and organization during the abovementioned task but demonstrated insight into task difficulty.  Pt was returned to room and left in recliner with quick release belt donned and mother present. Continue per current plan of care.    Function:  Eating Eating                 Cognition Comprehension Comprehension assist level: Understands basic 50 - 74% of the time/ requires cueing 25 - 49% of the time  Expression   Expression assist level: Expresses basic 25 - 49% of the time/requires cueing 50 - 75% of  the time. Uses single words/gestures.  Social Interaction Social Interaction assist level: Interacts appropriately 50 - 74% of the time - May be physically or verbally inappropriate.  Problem Solving Problem solving assist level: Solves basic 25 - 49% of the time - needs direction more than half the time to initiate, plan or complete simple activities  Memory Memory assist level: Recognizes or recalls less than 25% of the  time/requires cueing greater than 75% of the time    Pain Pain Assessment (1) Pain Assessment: No/denies pain Faces Pain Scale: No hurt Pain Assessment (2) Pain Assessment: No/denies pain Faces Pain Scale: No hurt  Therapy/Group: Individual Therapy  Korin Setzler, Selinda Orion 06/01/2015, 12:23 PM

## 2015-06-01 NOTE — Progress Notes (Signed)
Occupational Therapy Session Note  Patient Details  Name: Ashley [name] MRN: [id number] Date of Birth: 1976-03-08  Today's Date: 06/01/2015 OT Individual Time: 1000-1100 OT Individual Time Calculation (min): 60 min   Short Term Goals: Week 1:  OT Short Term Goal 1 (Week 1): Pt. will feed self with LUE with supervision OT Short Term Goal 2 (Week 1): Pt. sill maintain sustained atteniton for 2 minutes during grooming activity OT Short Term Goal 3 (Week 1): Pt. will bath self with max assist OT Short Term Goal 4 (Week 1): Pt. will dress UB with max assist OT Short Term Goal 5 (Week 1): Pt. will transfer to toilet with max assist  Skilled Therapeutic Interventions/Progress Updates: ADL-retraining at bed level with focus on improved bed mobility, adapted bathing/dressing of lower body (supine, HOB elevated), and w/c transfer.   Pt completes lower body bathing with setup and instructional cues while supine in bed.   OT notes small ulceration at right lower pubis and alerts RN for further assessment.   Bathing continues with good participation from patient to clean both legs after PROM to RLE and change of HOB position to improve reach.   Pt performs pelvic lift with manual facilitation to flex and block RLE as pt raises to pull up her pants on left and right.   Pt performs roll to right with overall min assist and raises to EOB with mod assist (pt=60%) and manual placement of RLE from side lying.   Pt completes sit>stand and stand>pivot with max assist (Pt=70% to raise and 80% to lower).    Pt left in w/c at end of session as husband arrives and is educated on pt's performance during session.      Therapy Documentation Precautions:  Precautions Precautions: Fall Precaution Comments: RUE flexortone Restrictions Weight Bearing Restrictions: No  Vital Signs: Therapy Vitals Temp: 97.5 F (36.4 C) Temp Source: Oral Pulse Rate: 76 Resp: 16 BP: 95/61 mmHg Patient Position (if appropriate):  Sitting Oxygen Therapy SpO2: 100 % O2 Device: Not Delivered   Pain: Pain Assessment Pain Assessment: No/denies pain   Function:   Eating Eating               Grooming Oral Care,Brush Teeth, Clean Dentures Activity:             Wash, Rinse, Dry Face Activity          Wash, Rinse, Dry Hands Activity          Brush, Comb Hair Activity        Shave Activity          Apply Makeup Activity                                                             Bathing Bathing position   Position: Bed  Bathing parts Body parts bathed by patient: Left upper leg;Left lower leg;Right upper leg;Right lower leg;Front perineal area Body parts bathed by helper: Buttocks  Bathing assist Assist Level: Touching or steadying assistance(Pt > 75%)       Upper Body Dressing/Undressing Upper body dressing                    Upper body assist         Lower Body Dressing/Undressing Lower body  dressing   What is the patient wearing?: Underwear;Pants;Socks   Underwear - Performed by helper: Thread/unthread right underwear leg;Thread/unthread left underwear leg;Pull underwear up/down Pants- Performed by patient: Thread/unthread left pants leg;Pull pants up/down Pants- Performed by helper: Thread/unthread right pants leg   Non-skid slipper socks- Performed by helper: Don/doff left sock                  Lower body assist         Toileting Toileting          Toileting assist      Bed Mobility Roll left and right activity      Sit to lying activity      Lying to sitting activity      Mobility details     Transfers Sit to stand transfer        Chair/bed transfer              Toilet transfer                Tub/shower transfer             Cognition Comprehension Comprehension assist level: Understands basic 50 - 74% of the time/ requires cueing 25 - 49% of the time  Expression Expression assist level: Expresses basic 25 - 49% of the  time/requires cueing 50 - 75% of the time. Uses single words/gestures.  Social Interaction Social Interaction assist level: Interacts appropriately 50 - 74% of the time - May be physically or verbally inappropriate.  Problem Solving Problem solving assist level: Solves basic 25 - 49% of the time - needs direction more than half the time to initiate, plan or complete simple activities  Memory Memory assist level: Recognizes or recalls less than 25% of the time/requires cueing greater than 75% of the time    Therapy/Group: Individual Therapy  [place] 06/01/2015, 3:19 PM

## 2015-06-01 NOTE — Progress Notes (Signed)
Physical Therapy Session Note  Patient Details  Name: Ashley [name] MRN: [id number] Date of Birth: [birth year]  Today's Date: 06/01/2015 PT Individual Time: 1105-1200 PT Individual Time Calculation (min): 55 min   Short Term Goals: Week 1:  PT Short Term Goal 1 (Week 1): Pt will perform bed mobility at mod A level with 50% cues to attend to RUE/LE PT Short Term Goal 2 (Week 1): Pt will perform functional transfers to the R and L at mod A level with 50% cues to attend to RUE/LE PT Short Term Goal 3 (Week 1): Pt will perform sit<>stand at max A of single therapist  PT Short Term Goal 4 (Week 1): Pt will propel w/c using L hemi technique at S level x 150'   Skilled Therapeutic Interventions/Progress Updates:    Patient seen with spouse presents.  Up in chair in hallway.  Pushed in chair to therapy gym.  Stood in parallel bars with +2 assist pt 50% to ambulate in parallel bars with +2 assist (pt=30-40%), cues for weight shift, cues for L/R LE movement and balance. Needed assist for anterior weight shift and cues in the bars for left weight shift.  Second walk in bars used ACE wrap to right ankle for DF assist and still assist for propelling right LE and for stance stability.  Patient transferred to bed via squat pivot max assist and to sidelying on left for right NMR pelvic diagonals with assist and facilitation, then in sitting for carryover with scooting.  Spouse requested to be checked off to be second helper for transfers to bathroom with stedy.  He assisted pt to sitting from supine and performed sit to stand to stedy as well as to toilet appropriately as second helper.  Signed off on safety sheet in room.  Patient left in supine with family present for lunch.   Therapy Documentation Precautions:  Precautions Precautions: Fall Precaution Comments: RUE flexortone Restrictions Weight Bearing Restrictions: No Pain: Pain Assessment Pain Assessment: No/denies pain Pain Score: Asleep Faces Pain  Scale: Hurts even more Pain Type: Acute pain Pain Location: Head Pain Descriptors / Indicators: Aching Pain Intervention(s): Medication (See eMAR)   Function:  Herbalist Devices: Grab bar or rail;Other (comment) (stedy)   Bed Mobility Roll left and right activity   Assist level: Touching or steadying assistance (Pt > 75%, lift 1 leg)  Sit to lying activity   Assist level: Mod assist (Pt 50 - 74%, lift 2 legs)  Lying to sitting activity   Assist level: Moderate assist (Pt 50 - 74%, lift 2 legs)  Mobility details Bed mobility details: Verbal cues for techniques;Visual cues/gestures for sequencing;Manual facilitation for weight shifting   Transfers Sit to stand transfer   Sit to stand assist level: 2 helpers Sit to stand assistive device: Armrests;Other (parallel bars, stedy)  Chair/bed transfer   Chair/bed transfer method: Squat pivot Chair/bed transfer assist level: Moderate assist (Pt 50 - 74%/lift or lower) Chair/bed transfer assistive device: Bedrails;Armrests   Chair/bed transfer details: Verbal cues for technique;Visual cues/gestures for sequencing;Manual facilitation for weight shifting;Manual facilitation for placement   Toilet transfer        Car transfer          Locomotion Ambulation     Max distance: 15 Assist level: 2 helpers  Walk 10 feet activity   Assist level: 2 helpers  Walk 50 feet with 2 turns activity      Walk 150 feet activity  Walk 10 feet on uneven surfaces activity      Stairs          Walk up/down 1 step activity        Walk up/down 4 steps activity      Walk up/down 12 steps activity      Pick up small objects from floor      Wheelchair          Wheel 50 feet with 2 turns activity      Wheel 150 feet activity   Assist Level: Dependent (Pt equals 0%)   Cognition Comprehension Comprehension assist level: Understands basic 50 - 74% of the time/ requires cueing 25 - 49% of the time   Expression Expression assist level: Expresses basic 25 - 49% of the time/requires cueing 50 - 75% of the time. Uses single words/gestures.  Social Interaction Social Interaction assist level: Interacts appropriately 50 - 74% of the time - May be physically or verbally inappropriate.  Problem Solving Problem solving assist level: Solves basic 25 - 49% of the time - needs direction more than half the time to initiate, plan or complete simple activities  Memory Memory assist level: Recognizes or recalls less than 25% of the time/requires cueing greater than 75% of the time    Therapy/Group: Individual Therapy  [place], [place] 06/01/2015  06/01/2015, 4:25 PM

## 2015-06-01 NOTE — Progress Notes (Signed)
Patient has painful small clustered fluid filled lesions on right mons--appears to be herpetic in nature. Will start patient on Valtrex. Will get WOC for confirmation as will need precautions.

## 2015-06-01 NOTE — Progress Notes (Signed)
Subjective/Complaints: Slept better, woke up at 4am with spasms ROS cannot obtain due to MS, aphasia Objective: Vital Signs: Blood pressure 103/70, pulse 78, temperature 97.7 F (36.5 C), temperature source Oral, resp. rate 16, height $RemoveBe'5\' 3"'NotjEJPFY$  (1.6 m), weight 66.6 kg (146 lb 13.2 oz), SpO2 99 %, unknown if currently breastfeeding. No results found. Results for orders placed or performed during the hospital encounter of 05/26/15 (from the past 72 hour(s))  Urinalysis, Routine w reflex microscopic (not at [facility])     Status: Abnormal   Collection Time: 05/30/15  4:28 PM  Result Value Ref Range   Color, Urine YELLOW YELLOW   APPearance TURBID (A) CLEAR   Specific Gravity, Urine 1.007 1.005 - 1.030   pH 8.0 5.0 - 8.0   Glucose, UA NEGATIVE NEGATIVE mg/dL   Hgb urine dipstick LARGE (A) NEGATIVE   Bilirubin Urine NEGATIVE NEGATIVE   Ketones, ur NEGATIVE NEGATIVE mg/dL   Protein, ur NEGATIVE NEGATIVE mg/dL   Urobilinogen, UA 0.2 0.0 - 1.0 mg/dL   Nitrite POSITIVE (A) NEGATIVE   Leukocytes, UA MODERATE (A) NEGATIVE  Urine culture     Status: None   Collection Time: 05/30/15  4:28 PM  Result Value Ref Range   Specimen Description URINE, RANDOM    Special Requests NONE    Culture >=100,000 COLONIES/mL KLEBSIELLA PNEUMONIAE    Report Status 06/01/2015 FINAL    Organism ID, Bacteria KLEBSIELLA PNEUMONIAE       Susceptibility   Klebsiella pneumoniae - MIC*    AMPICILLIN >=32 RESISTANT Resistant     CEFAZOLIN <=4 SENSITIVE Sensitive     CEFTRIAXONE <=1 SENSITIVE Sensitive     CIPROFLOXACIN <=0.25 SENSITIVE Sensitive     GENTAMICIN <=1 SENSITIVE Sensitive     IMIPENEM <=0.25 SENSITIVE Sensitive     NITROFURANTOIN 64 INTERMEDIATE Intermediate     TRIMETH/SULFA <=20 SENSITIVE Sensitive     AMPICILLIN/SULBACTAM 4 SENSITIVE Sensitive     PIP/TAZO <=4 SENSITIVE Sensitive     * >=100,000 COLONIES/mL KLEBSIELLA PNEUMONIAE  Urine microscopic-add on     Status: Abnormal   Collection Time:  05/30/15  4:28 PM  Result Value Ref Range   Squamous Epithelial / LPF RARE RARE   WBC, UA 11-20 <3 WBC/hpf   RBC / HPF 21-50 <3 RBC/hpf   Bacteria, UA MANY (A) RARE   Casts GRANULAR CAST (A) NEGATIVE   Urine-Other AMORPHOUS URATES/PHOSPHATES   Basic metabolic panel     Status: Abnormal   Collection Time: 06/01/15  6:05 AM  Result Value Ref Range   Sodium 135 135 - 145 mmol/L   Potassium 4.1 3.5 - 5.1 mmol/L   Chloride 98 (L) 101 - 111 mmol/L   CO2 29 22 - 32 mmol/L   Glucose, Bld 99 65 - 99 mg/dL   BUN 6 6 - 20 mg/dL   Creatinine, Ser 0.50 0.44 - 1.00 mg/dL   Calcium 9.8 8.9 - 10.3 mg/dL   GFR calc non Af Amer >60 >60 mL/min   GFR calc Af Amer >60 >60 mL/min    Comment: (NOTE) The eGFR has been calculated using the CKD EPI equation. This calculation has not been validated in all clinical situations. eGFR's persistently <60 mL/min signify possible Chronic Kidney Disease.    Anion gap 8 5 - 15     HEENT: normal Cardio: RRR and no murmur Resp: CTA B/L and unlabored GI: BS positive and NT, ND Extremity:  BS positive and NT, ND Skin:   Central line site  CDI Neuro: Alert/Oriented, Abnormal Sensory reduced sensation to pinch RUE, Abnormal Motor 0/5 RUE, Trace knee ext RLE and Aphasic, modified Ashworth score of 3 at the right biceps and right hamstrings Musc/Skel:  Other no pain with UE or LE ROM Gen NAD   Assessment/Plan: 1. Functional deficits secondary to  Right flaccid hemiplegia,dysphagia, and aphasia secondary to large left parietal intracranial hemorrhage which require 3+ hours per day of interdisciplinary therapy in a comprehensive inpatient rehab setting. Physiatrist is providing close team supervision and 24 hour management of active medical problems listed below. Physiatrist and rehab team continue to assess barriers to discharge/monitor patient progress toward functional and medical goals.  FIM: Function - Bathing Position: Shower Body parts bathed by patient:  Chest, Abdomen, Front perineal area, Right upper leg, Right arm, Left upper leg, Left lower leg Body parts bathed by helper: Buttocks, Back, Left arm Assist Level: Set up, Supervision or verbal cues, Touching or steadying assistance(Pt > 75%), 2 helpers Set up : To open containers, To obtain items, To adjust water temperature  Function- Upper Body Dressing/Undressing What is the patient wearing?: Pull over shirt/dress Pull over shirt/dress - Perfomed by patient: Put head through opening, Thread/unthread left sleeve Pull over shirt/dress - Perfomed by helper: Thread/unthread right sleeve, Pull shirt over trunk Assist Level: Touching or steadying assistance(Pt > 75%) Function - Lower Body Dressing/Undressing Lower body dressing/undressing activity did not occur: 1: Total-Patient completed less than 25% of tasks What is the patient wearing?: Pants, Non-skid slipper socks Position: Wheelchair/chair at sink Underwear - Performed by helper: Thread/unthread right underwear leg, Thread/unthread left underwear leg, Pull underwear up/down Pants- Performed by patient: Thread/unthread left pants leg Pants- Performed by helper: Pull pants up/down, Thread/unthread right pants leg Non-skid slipper socks- Performed by helper: Don/doff right sock, Don/doff left sock Socks - Performed by patient: Don/doff left sock Socks - Performed by helper: Don/doff right sock Shoes - Performed by patient: Don/doff left shoe Shoes - Performed by helper: Don/doff right shoe, Fasten right, Fasten left Assist Level: 2 Helpers, Touching or steadying assistance (Pt > 75%), Supervision or verbal cues Set up : To obtain clothing/put away  Function - Toileting Toileting activity did not occur: No continent bowel/bladder event Toileting steps completed by patient: Adjust clothing prior to toileting, Performs perineal hygiene, Adjust clothing after toileting Toileting steps completed by helper: Adjust clothing prior to toileting,  Performs perineal hygiene, Adjust clothing after toileting Toileting Assistive Devices: Grab bar or rail, Toilet aid Assist level: Two helpers  Function - Air cabin crew transfer activity did not occur: Risk analyst transfer assistive device: Elevated toilet seat/BSC over toilet, Mechanical lift Mechanical lift: Stedy Assist level to toilet: 2 helpers Assist level from toilet: 2 helpers Assist level to bedside commode (at bedside): 2 helpers Assist level from bedside commode (at bedside): 2 helpers  Function - Chair/bed transfer Chair/bed transfer method: Squat pivot Chair/bed transfer assist level: Maximal assist (Pt 25 - 49%/lift and lower) Chair/bed transfer assistive device: Other (wall rail) Chair/bed transfer details: Visual cues/gestures for sequencing, Visual cues/gestures for precautions/safety, Verbal cues for precautions/safety, Verbal cues for sequencing, Manual facilitation for weight shifting, Manual facilitation for placement  Function - Locomotion: Wheelchair Will patient use wheelchair at discharge?: Yes Type: Manual Max wheelchair distance: 150 Assist Level: Supervision or verbal cues Assist Level: Supervision or verbal cues Assist Level: Supervision or verbal cues Function - Locomotion: Ambulation Assistive device: Rail in hallway Max distance: 30 Assist level: 2 helpers Assist level: 2 helpers Walk 50 feet  with 2 turns activity did not occur: Safety/medical concerns  Function - Comprehension Comprehension: Auditory Comprehension assist level: Understands basic 25 - 49% of the time/ requires cueing 50 - 75% of the time  Function - Expression Expression: Verbal Expression assist level: Expresses basic 25 - 49% of the time/requires cueing 50 - 75% of the time. Uses single words/gestures.  Function - Social Interaction Social Interaction assist level: Interacts appropriately 50 - 74% of the time - May be physically or verbally  inappropriate.  Function - Problem Solving Problem solving assist level: Solves basic less than 25% of the time - needs direction nearly all the time or does not effectively solve problems and may need a restraint for safety  Function - Memory Memory assist level: Recognizes or recalls 25 - 49% of the time/requires cueing 50 - 75% of the time Patient normally able to recall (first 3 days only): That he or she is in a hospital  Medical Problem List and Plan: 1. Functional deficits secondary to Right flaccid hemiplegia,dysphagia, and aphasia secondary to large left parietal intracranial hemorrhage, also severe cog def, spasticity increasing now on Zanaflex will allow Repeat x1 2. DVT Prophylaxis/Anticoagulation: Pharmaceutical: Lovenox 3. Pain Management: Tylenol prn 4. Mood: LCSW to follow for evaluation and support as cognition/aphasia improves.  5. Neuropsych: This patient is not capable of making decisions on her own behalf. 6. Skin/Wound Care: Routine pressure relief measures.  7. Fluids/Electrolytes/Nutrition: Monitor I/O. 50% meals yesterday, about 500 cc fluid recorded. Check b met, may need nighttime fluids 8. Insomnia?:and agitation , didn't receive trazodone on Monday, make  Trazodone prn, cont qhs  seroquel 9. New onset seizures: On keppra bid.check valproate level, seizure free, appears sedated may reduce valproate if serum level is high 10. HTN: Monitor BP every 8 hours. Being treated prn.   11. Urinary frequency Foley, UA positive for UTI , Kleb P seen on urine culture, sens to Keflex LOS (Days) 6 A FACE TO FACE EVALUATION WAS PERFORMED  Laporsche Hoeger E 06/01/2015, 8:39 AM

## 2015-06-01 NOTE — Consult Note (Signed)
WOC wound consult note Reason for Consult: lesion Wound type: partial thickness lesion on the right mons, not indurated, does not appear tender. No drainage at the time of my assessment, not able to express drainage.  PA has started her on Valtrax for possible hepatic lesion. May be folliculitis, it appears mons had been shaved.  Not able to determine from patient and or family if folliculitis has been an issue before.  Will monitor, no topical care at this time.  Bedside RN to contact [facility] nurse on Monday if this area has not improved.   Nelton Amsden [place] RN,CWOCN [phone]

## 2015-06-02 ENCOUNTER — Inpatient Hospital Stay (HOSPITAL_COMMUNITY)
Admission: AD | Admit: 2015-06-02 | Discharge: 2015-06-04 | DRG: 776 | Disposition: A | Discharge status: Rehabilitation Facility | Payer: BLUE CROSS/BLUE SHIELD | Source: Other Acute Inpatient Hospital | Attending: Internal Medicine | Admitting: Internal Medicine

## 2015-06-02 ENCOUNTER — Inpatient Hospital Stay (HOSPITAL_COMMUNITY): Payer: BLUE CROSS/BLUE SHIELD

## 2015-06-02 ENCOUNTER — Other Ambulatory Visit (HOSPITAL_COMMUNITY): Payer: BLUE CROSS/BLUE SHIELD

## 2015-06-02 ENCOUNTER — Inpatient Hospital Stay (HOSPITAL_COMMUNITY): Payer: BLUE CROSS/BLUE SHIELD | Admitting: Speech Pathology

## 2015-06-02 ENCOUNTER — Inpatient Hospital Stay (HOSPITAL_COMMUNITY): Payer: BLUE CROSS/BLUE SHIELD | Admitting: Physical Therapy

## 2015-06-02 ENCOUNTER — Encounter (HOSPITAL_COMMUNITY): Payer: Self-pay | Admitting: Internal Medicine

## 2015-06-02 DIAGNOSIS — I619 Nontraumatic intracerebral hemorrhage, unspecified: Secondary | ICD-10-CM | POA: Diagnosis present

## 2015-06-02 DIAGNOSIS — I615 Nontraumatic intracerebral hemorrhage, intraventricular: Secondary | ICD-10-CM | POA: Diagnosis present

## 2015-06-02 DIAGNOSIS — N390 Urinary tract infection, site not specified: Secondary | ICD-10-CM | POA: Diagnosis present

## 2015-06-02 DIAGNOSIS — O8620 Urinary tract infection following delivery, unspecified: Secondary | ICD-10-CM | POA: Diagnosis present

## 2015-06-02 DIAGNOSIS — O99355 Diseases of the nervous system complicating the puerperium: Secondary | ICD-10-CM | POA: Diagnosis present

## 2015-06-02 DIAGNOSIS — F419 Anxiety disorder, unspecified: Secondary | ICD-10-CM | POA: Diagnosis present

## 2015-06-02 DIAGNOSIS — G8113 Spastic hemiplegia affecting right nondominant side: Secondary | ICD-10-CM | POA: Diagnosis present

## 2015-06-02 DIAGNOSIS — I611 Nontraumatic intracerebral hemorrhage in hemisphere, cortical: Secondary | ICD-10-CM

## 2015-06-02 DIAGNOSIS — F4321 Adjustment disorder with depressed mood: Secondary | ICD-10-CM | POA: Diagnosis not present

## 2015-06-02 DIAGNOSIS — O99345 Other mental disorders complicating the puerperium: Secondary | ICD-10-CM | POA: Diagnosis present

## 2015-06-02 DIAGNOSIS — G8110 Spastic hemiplegia affecting unspecified side: Secondary | ICD-10-CM | POA: Diagnosis not present

## 2015-06-02 DIAGNOSIS — I959 Hypotension, unspecified: Secondary | ICD-10-CM | POA: Diagnosis present

## 2015-06-02 DIAGNOSIS — R569 Unspecified convulsions: Secondary | ICD-10-CM | POA: Diagnosis not present

## 2015-06-02 DIAGNOSIS — I69398 Other sequelae of cerebral infarction: Secondary | ICD-10-CM | POA: Diagnosis not present

## 2015-06-02 DIAGNOSIS — I9589 Other hypotension: Secondary | ICD-10-CM | POA: Diagnosis not present

## 2015-06-02 DIAGNOSIS — Z79899 Other long term (current) drug therapy: Secondary | ICD-10-CM

## 2015-06-02 DIAGNOSIS — A419 Sepsis, unspecified organism: Secondary | ICD-10-CM | POA: Diagnosis present

## 2015-06-02 DIAGNOSIS — O9943 Diseases of the circulatory system complicating the puerperium: Secondary | ICD-10-CM | POA: Diagnosis present

## 2015-06-02 DIAGNOSIS — B961 Klebsiella pneumoniae [K. pneumoniae] as the cause of diseases classified elsewhere: Secondary | ICD-10-CM | POA: Diagnosis present

## 2015-06-02 DIAGNOSIS — N3000 Acute cystitis without hematuria: Secondary | ICD-10-CM | POA: Diagnosis not present

## 2015-06-02 DIAGNOSIS — I69120 Aphasia following nontraumatic intracerebral hemorrhage: Secondary | ICD-10-CM | POA: Diagnosis not present

## 2015-06-02 DIAGNOSIS — S06350S Traumatic hemorrhage of left cerebrum without loss of consciousness, sequela: Secondary | ICD-10-CM | POA: Diagnosis not present

## 2015-06-02 DIAGNOSIS — R4701 Aphasia: Secondary | ICD-10-CM | POA: Diagnosis present

## 2015-06-02 DIAGNOSIS — G40909 Epilepsy, unspecified, not intractable, without status epilepticus: Secondary | ICD-10-CM

## 2015-06-02 DIAGNOSIS — I69151 Hemiplegia and hemiparesis following nontraumatic intracerebral hemorrhage affecting right dominant side: Secondary | ICD-10-CM | POA: Diagnosis not present

## 2015-06-02 HISTORY — DX: Hypotension, unspecified: I95.9

## 2015-06-02 HISTORY — DX: Nontraumatic intracerebral hemorrhage, unspecified: I61.9

## 2015-06-02 LAB — CBC WITH DIFFERENTIAL/PLATELET
BASOS ABS: 0 10*3/uL (ref 0.0–0.1)
BASOS ABS: 0 10*3/uL (ref 0.0–0.1)
BASOS PCT: 0 % (ref 0–1)
Basophils Relative: 1 % (ref 0–1)
EOS ABS: 0.1 10*3/uL (ref 0.0–0.7)
EOS PCT: 2 % (ref 0–5)
Eosinophils Absolute: 0.1 10*3/uL (ref 0.0–0.7)
Eosinophils Relative: 1 % (ref 0–5)
HCT: 39.9 % (ref 36.0–46.0)
HEMATOCRIT: 39.8 % (ref 36.0–46.0)
HEMOGLOBIN: 12.8 g/dL (ref 12.0–15.0)
Hemoglobin: 13 g/dL (ref 12.0–15.0)
LYMPHS PCT: 26 % (ref 12–46)
LYMPHS PCT: 19 % (ref 12–46)
Lymphs Abs: 1.9 10*3/uL (ref 0.7–4.0)
Lymphs Abs: 1.4 10*3/uL (ref 0.7–4.0)
MCH: 29.1 pg (ref 26.0–34.0)
MCH: 29 pg (ref 26.0–34.0)
MCHC: 32.6 g/dL (ref 30.0–36.0)
MCHC: 32.2 g/dL (ref 30.0–36.0)
MCV: 89.3 fL (ref 78.0–100.0)
MCV: 90 fL (ref 78.0–100.0)
MONO ABS: 0.7 10*3/uL (ref 0.1–1.0)
MONOS PCT: 9 % (ref 3–12)
Monocytes Absolute: 0.6 10*3/uL (ref 0.1–1.0)
Monocytes Relative: 10 % (ref 3–12)
NEUTROS ABS: 5.3 10*3/uL (ref 1.7–7.7)
NEUTROS PCT: 70 % (ref 43–77)
Neutro Abs: 4.6 10*3/uL (ref 1.7–7.7)
Neutrophils Relative %: 63 % (ref 43–77)
PLATELETS: 416 10*3/uL — AB (ref 150–400)
Platelets: 422 10*3/uL — ABNORMAL HIGH (ref 150–400)
RBC: 4.47 MIL/uL (ref 3.87–5.11)
RBC: 4.42 MIL/uL (ref 3.87–5.11)
RDW: 13.8 % (ref 11.5–15.5)
RDW: 13.7 % (ref 11.5–15.5)
WBC: 7.3 10*3/uL (ref 4.0–10.5)
WBC: 7.4 10*3/uL (ref 4.0–10.5)

## 2015-06-02 LAB — COMPREHENSIVE METABOLIC PANEL
ALT: 46 U/L (ref 14–54)
AST: 30 U/L (ref 15–41)
Albumin: 3.2 g/dL — ABNORMAL LOW (ref 3.5–5.0)
Alkaline Phosphatase: 75 U/L (ref 38–126)
Anion gap: 9 (ref 5–15)
BUN: 9 mg/dL (ref 6–20)
CHLORIDE: 100 mmol/L — AB (ref 101–111)
CO2: 26 mmol/L (ref 22–32)
Calcium: 9 mg/dL (ref 8.9–10.3)
Creatinine, Ser: 0.56 mg/dL (ref 0.44–1.00)
Glucose, Bld: 93 mg/dL (ref 65–99)
POTASSIUM: 4.4 mmol/L (ref 3.5–5.1)
SODIUM: 135 mmol/L (ref 135–145)
Total Bilirubin: 0.5 mg/dL (ref 0.3–1.2)
Total Protein: 5.7 g/dL — ABNORMAL LOW (ref 6.5–8.1)

## 2015-06-02 LAB — PROTIME-INR
INR: 1.05 (ref 0.00–1.49)
Prothrombin Time: 13.9 seconds (ref 11.6–15.2)

## 2015-06-02 LAB — PROCALCITONIN

## 2015-06-02 LAB — CREATININE, SERUM
Creatinine, Ser: 0.47 mg/dL (ref 0.44–1.00)
GFR calc Af Amer: >60 mL/min (ref >60)
GFR calc non Af Amer: >60 mL/min (ref >60)

## 2015-06-02 LAB — APTT: APTT: 31 s (ref 24–37)

## 2015-06-02 LAB — LACTIC ACID, PLASMA
LACTIC ACID, VENOUS: 1.4 mmol/L (ref 0.5–2.0)
Lactic Acid, Venous: 1.6 mmol/L (ref 0.5–2.0)

## 2015-06-02 LAB — MAGNESIUM: MAGNESIUM: 1.8 mg/dL (ref 1.7–2.4)

## 2015-06-02 LAB — PHOSPHORUS: PHOSPHORUS: 4.3 mg/dL (ref 2.5–4.6)

## 2015-06-02 LAB — GLUCOSE, CAPILLARY: Glucose-Capillary: 92 mg/dL (ref 65–99)

## 2015-06-02 MED ORDER — QUETIAPINE FUMARATE 25 MG PO TABS
25.0000 mg | ORAL_TABLET | Freq: Every day | ORAL | Status: DC
  Administered 2015-06-02 – 2015-06-03 (×2): 25 mg via ORAL
  Filled 2015-06-02 (×2): qty 1

## 2015-06-02 MED ORDER — LEVETIRACETAM 500 MG PO TABS
500.0000 mg | ORAL_TABLET | Freq: Two times a day (BID) | ORAL | Status: DC
  Filled 2015-06-02: qty 1

## 2015-06-02 MED ORDER — SODIUM CHLORIDE 0.9 % IV BOLUS (SEPSIS)
1000.0000 mL | INTRAVENOUS | Status: AC
  Administered 2015-06-02: 1000 mL via INTRAVENOUS

## 2015-06-02 MED ORDER — SODIUM CHLORIDE 0.9 % IV SOLN: INTRAVENOUS | Status: DC

## 2015-06-02 MED ORDER — LEVETIRACETAM 750 MG PO TABS
1500.0000 mg | ORAL_TABLET | Freq: Two times a day (BID) | ORAL | Status: DC
  Administered 2015-06-02 – 2015-06-04 (×4): 1500 mg via ORAL
  Filled 2015-06-02 (×4): qty 2

## 2015-06-02 MED ORDER — SODIUM CHLORIDE 0.9 % IV SOLN
500.0000 mg | Freq: Once | INTRAVENOUS | Status: AC
  Administered 2015-06-02: 500 mg via INTRAVENOUS
  Filled 2015-06-02: qty 5

## 2015-06-02 MED ORDER — VANCOMYCIN HCL IN DEXTROSE 1-5 GM/200ML-% IV SOLN
1000.0000 mg | Freq: Two times a day (BID) | INTRAVENOUS | Status: DC
  Administered 2015-06-02 – 2015-06-03 (×3): 1000 mg via INTRAVENOUS
  Filled 2015-06-02 (×4): qty 200

## 2015-06-02 MED ORDER — DOCUSATE SODIUM 100 MG PO CAPS
100.0000 mg | ORAL_CAPSULE | Freq: Every day | ORAL | Status: DC
  Administered 2015-06-04: 100 mg via ORAL
  Filled 2015-06-02 (×2): qty 1

## 2015-06-02 MED ORDER — OXYCODONE HCL 5 MG PO TABS
5.0000 mg | ORAL_TABLET | ORAL | Status: DC | PRN
  Administered 2015-06-03: 5 mg via ORAL
  Filled 2015-06-02: qty 1

## 2015-06-02 MED ORDER — LEVETIRACETAM 500 MG/5ML IV SOLN
500.0000 mg | Freq: Once | INTRAVENOUS | Status: AC
  Administered 2015-06-02: 500 mg via INTRAVENOUS
  Filled 2015-06-02: qty 5

## 2015-06-02 MED ORDER — SODIUM CHLORIDE 0.9 % IV BOLUS (SEPSIS)
1000.0000 mL | Freq: Once | INTRAVENOUS | Status: DC
  Administered 2015-06-02: 1000 mL via INTRAVENOUS

## 2015-06-02 MED ORDER — DIAZEPAM 2.5 MG RE GEL: 2.5000 mg | Freq: Once | RECTAL | Status: DC

## 2015-06-02 MED ORDER — ACETAMINOPHEN 325 MG PO TABS: 650.0000 mg | ORAL_TABLET | Freq: Four times a day (QID) | ORAL | Status: DC | PRN

## 2015-06-02 MED ORDER — PIPERACILLIN-TAZOBACTAM 3.375 G IVPB
3.3750 g | Freq: Three times a day (TID) | INTRAVENOUS | Status: DC
  Administered 2015-06-02 – 2015-06-03 (×4): 3.375 g via INTRAVENOUS
  Filled 2015-06-02 (×6): qty 50

## 2015-06-02 MED ORDER — ACETAMINOPHEN 325 MG PO TABS
650.0000 mg | ORAL_TABLET | Freq: Four times a day (QID) | ORAL | Status: DC | PRN
  Administered 2015-06-02: 650 mg via ORAL

## 2015-06-02 MED ORDER — SODIUM CHLORIDE 0.9 % IV SOLN
1500.0000 mg | Freq: Two times a day (BID) | INTRAVENOUS | Status: DC
  Filled 2015-06-02: qty 15

## 2015-06-02 MED ORDER — SODIUM CHLORIDE 0.9 % IV SOLN
INTRAVENOUS | Status: DC
  Administered 2015-06-02 – 2015-06-03 (×2): via INTRAVENOUS

## 2015-06-02 MED ORDER — ONDANSETRON HCL 4 MG PO TABS: 4.0000 mg | ORAL_TABLET | Freq: Four times a day (QID) | ORAL | Status: DC | PRN

## 2015-06-02 MED ORDER — ONDANSETRON HCL 4 MG/2ML IJ SOLN: 4.0000 mg | Freq: Four times a day (QID) | INTRAMUSCULAR | Status: DC | PRN

## 2015-06-02 MED ORDER — ACETAMINOPHEN 650 MG RE SUPP: 650.0000 mg | Freq: Four times a day (QID) | RECTAL | Status: DC | PRN

## 2015-06-02 MED ORDER — ACETAMINOPHEN 325 MG PO TABS
650.0000 mg | ORAL_TABLET | Freq: Four times a day (QID) | ORAL | Status: DC | PRN
  Administered 2015-06-02 – 2015-06-04 (×4): 650 mg via ORAL
  Filled 2015-06-02 (×4): qty 2

## 2015-06-02 MED ORDER — TRAZODONE HCL 50 MG PO TABS
50.0000 mg | ORAL_TABLET | Freq: Every evening | ORAL | Status: DC | PRN
  Administered 2015-06-03: 50 mg via ORAL
  Filled 2015-06-02 (×2): qty 1

## 2015-06-02 MED ORDER — HYDROMORPHONE HCL 1 MG/ML IJ SOLN: 0.5000 mg | INTRAMUSCULAR | Status: DC | PRN

## 2015-06-02 MED ORDER — SODIUM CHLORIDE 0.9 % IV SOLN
Freq: Once | INTRAVENOUS | Status: AC
  Administered 2015-06-02: 1000 mL via INTRAVENOUS

## 2015-06-02 NOTE — Consult Note (Addendum)
Reason for Consult:Seizure Referring Physician: Eliseo Squires  CC: Seizure  HPI: Ashley [name] is an 39 y.o. female recently admitted with a left parietal ICH and subarachnoid extension, who was on rehab to receive therapy.  Noted to have a witnessed episode when she was noted to have "her eyes rolled into the back of her head and shaking of her upper extremities".  Patient became lethargic afterward.  Was hypotensive.  Patient transferred to stepdown.  No further seizure activity noted.  500mg  of Keppra given.    Past Medical History  Diagnosis Date  . Anxiety   . ICH (intracerebral hemorrhage)     Past Surgical History  Procedure Laterality Date  . Dilation and curettage of uterus      Family History  Problem Relation Age of Onset  . Cancer Mother   . Cancer Father     Social History:  reports that she has never smoked. She has never used smokeless tobacco. She reports that she drinks alcohol. She reports that she does not use illicit drugs.  No Known Allergies  Medications:  I have reviewed the patient's current medications. Scheduled: . levETIRAcetam  500 mg Intravenous Once  . levETIRAcetam  500 mg Intravenous Once  . levETIRAcetam  500 mg Oral BID  . piperacillin-tazobactam (ZOSYN)  IV  3.375 g Intravenous 3 times per day  . vancomycin  1,000 mg Intravenous Q12H    ROS: History obtained from husband  General ROS: difficulty sleeping Psychological ROS: negative for - behavioral disorder, hallucinations, memory difficulties, mood swings or suicidal ideation Ophthalmic ROS: negative for - blurry vision, double vision, eye pain or loss of vision ENT ROS: negative for - epistaxis, nasal discharge, oral lesions, sore throat, tinnitus or vertigo Allergy and Immunology ROS: negative for - hives or itchy/watery eyes Hematological and Lymphatic ROS: negative for - bleeding problems, bruising or swollen lymph nodes Endocrine ROS: negative for - galactorrhea, hair pattern changes,  polydipsia/polyuria or temperature intolerance Respiratory ROS: negative for - cough, hemoptysis, shortness of breath or wheezing Cardiovascular ROS: negative for - chest pain, dyspnea on exertion, edema or irregular heartbeat Gastrointestinal ROS: negative for - abdominal pain, diarrhea, hematemesis, nausea/vomiting or stool incontinence Genito-Urinary ROS: negative for - dysuria, hematuria, incontinence or urinary frequency/urgency Musculoskeletal ROS: negative for - joint swelling or muscular weakness Neurological ROS: as noted in HPI Dermatological ROS: negative for rash and skin lesion changes  Physical Examination: Blood pressure 89/51, pulse 70, temperature 98.5 F (36.9 C), temperature source Oral, resp. rate 19, height 5\' 3"  (1.6 m), weight 64.5 kg (142 lb 3.2 oz), SpO2 98 %, unknown if currently breastfeeding.  HEENT-  Normocephalic, no lesions, without obvious abnormality.  Normal external eye and conjunctiva.  Normal TM's bilaterally.  Normal auditory canals and external ears. Normal external nose, mucus membranes and septum.  Normal pharynx. Cardiovascular- S1, S2 normal, pulses palpable throughout   Lungs- chest clear, no wheezing, rales, normal symmetric air entry Abdomen- soft, non-tender; bowel sounds normal; no masses,  no organomegaly Extremities- no edema Lymph-no adenopathy palpable Musculoskeletal-no joint tenderness, deformity or swelling Skin-warm and dry, no hyperpigmentation, vitiligo, or suspicious lesions  Neurological Examination Mental Status: Sleeping but easily awakened.  Able to follow some simple commands but requires nonverbal cues often.  Can give 1-2 word answers to questions.  Nods head mostly.  Cranial Nerves: II: Discs flat bilaterally; Pupils equal, round, reactive to light and accommodation III,IV, VI: ptosis not present, extra-ocular motions intact bilaterally V,VII: right facial droop VIII: hearing normal  bilaterally IX,X: gag reflex  present XI: decreased right shoulder shrug XII: unable to perform Motor: Dense right hemiplegia.  Moves left upper and lower extremity against gravity.   Deep Tendon Reflexes: 2+ and symmetric throughout Plantars: Right: downgoing   Left: downgoing Cerebellar: Unable to test secondary to ability to follow commands Gait: not tested due top safety concerns   Laboratory Studies:   Basic Metabolic Panel:  Recent Labs Lab 05/26/15 1015 05/27/15 0650 05/29/15 0540 06/01/15 0605 06/02/15 0014 06/02/15 0600  NA 140 135 137 135  --  135  K 3.0* 3.1* 3.9 4.1  --  4.4  CL 102 101 103 98*  --  100*  CO2 29 27 27 29   --  26  GLUCOSE 107* 98 95 99  --  93  BUN 14 8 <5* 6  --  9  CREATININE 0.54 0.37* 0.39* 0.50 0.47 0.56  CALCIUM 7.7* 8.0* 8.6* 9.8  --  9.0    Liver Function Tests:  Recent Labs Lab 05/27/15 0650 06/02/15 0600  AST 36 30  ALT 30 46  ALKPHOS 64 75  BILITOT 0.9 0.5  PROT 5.0* 5.7*  ALBUMIN 2.7* 3.2*   No results for input(s): LIPASE, AMYLASE in the last 168 hours. No results for input(s): AMMONIA in the last 168 hours.  CBC:  Recent Labs Lab 05/27/15 0650 06/02/15 0600  WBC 10.9* 7.3  NEUTROABS 8.3* 4.6  HGB 12.6 13.0  HCT 37.4 39.9  MCV 87.6 89.3  PLT 157 416*    Cardiac Enzymes: No results for input(s): CKTOTAL, CKMB, CKMBINDEX, TROPONINI in the last 168 hours.  BNP: Invalid input(s): POCBNP  CBG:  Recent Labs Lab 06/02/15 0522  GLUCAP 42    Microbiology: Results for orders placed or performed during the hospital encounter of 05/26/15  Urine culture     Status: None   Collection Time: 05/30/15  4:28 PM  Result Value Ref Range Status   Specimen Description URINE, RANDOM  Final   Special Requests NONE  Final   Culture >=100,000 COLONIES/mL KLEBSIELLA PNEUMONIAE  Final   Report Status 06/01/2015 FINAL  Final   Organism ID, Bacteria KLEBSIELLA PNEUMONIAE  Final      Susceptibility   Klebsiella pneumoniae - MIC*    AMPICILLIN >=32  RESISTANT Resistant     CEFAZOLIN <=4 SENSITIVE Sensitive     CEFTRIAXONE <=1 SENSITIVE Sensitive     CIPROFLOXACIN <=0.25 SENSITIVE Sensitive     GENTAMICIN <=1 SENSITIVE Sensitive     IMIPENEM <=0.25 SENSITIVE Sensitive     NITROFURANTOIN 64 INTERMEDIATE Intermediate     TRIMETH/SULFA <=20 SENSITIVE Sensitive     AMPICILLIN/SULBACTAM 4 SENSITIVE Sensitive     PIP/TAZO <=4 SENSITIVE Sensitive     * >=100,000 COLONIES/mL KLEBSIELLA PNEUMONIAE    Coagulation Studies: No results for input(s): LABPROT, INR in the last 72 hours.  Urinalysis:  Recent Labs Lab 05/30/15 1628  COLORURINE YELLOW  LABSPEC 1.007  PHURINE 8.0  GLUCOSEU NEGATIVE  HGBUR LARGE*  BILIRUBINUR NEGATIVE  KETONESUR NEGATIVE  PROTEINUR NEGATIVE  UROBILINOGEN 0.2  NITRITE POSITIVE*  LEUKOCYTESUR MODERATE*    Lipid Panel:  No results found for: CHOL, TRIG, HDL, CHOLHDL, VLDL, LDLCALC  HgbA1C: No results found for: HGBA1C  Urine Drug Screen:  No results found for: LABOPIA, COCAINSCRNUR, LABBENZ, AMPHETMU, THCU, LABBARB  Alcohol Level: No results for input(s): ETH in the last 168 hours.   Imaging: Dg Chest Port 1 View  06/02/2015   CLINICAL DATA:  Sepsis  EXAM:  PORTABLE CHEST - 1 VIEW  COMPARISON:  05/21/2015  FINDINGS: The heart size and mediastinal contours are within normal limits. Both lungs are clear. The visualized skeletal structures are unremarkable.  IMPRESSION: No active disease.   Electronically Signed   By: Inez Catalina M.D.   On: 06/02/2015 07:02     Assessment/Plan: 39 year old female with recent history of left parietal ICH.  Now with witnessed seizure.  Has returned to baseline neurological examination.  Cooperative with examination.  Do not feel that patient is having subclinical seizures.  Will require anticonvulsant therapy-likely lifelong.      Recommendations: 1.  Head CT to rule out recurrent hemorrhage 2.  Load Keppra with 1000mg  IV 3.  Continue maintenance at Keppra at 1500mg  po  BID 4.  Seizure precautions 5.  Ativan prn for seizure activity 6.  Agree with treatment of infection.     Alexis Goodell, MD [facility] [phone] 06/02/2015, 9:05 AM

## 2015-06-02 NOTE — Progress Notes (Signed)
0430 RN was notified that pt was seizing. Myself, and Candace Cruise, RN went to pts room and found her in the bathroom with the NT, and Husband, on the steady. Rapid Response  And charge nurse called at [phone]. Pt placed in the bed and on her left side. NT and husband witnessed the seizure, both stating it lasted for about 45 seconds. They said her upper extremities were shaking, and her eyes rolled to the back of her head. When the RN's arrived to the room, pt was no longer seizing, but was very lethargic. Vital Signs 71/26, HR 87, O2 98% and resp between 12-15, 99.2 rectal temp. Brooke, Rapid Response RN arrived. O2 2L [place] B[facility] applied. Dr. Letta Pate notified and obtained an order for NS fluids. Dr. Letta Pate said he will place more orders, and get a hospitalist to come assess her. IV started, fluids given, and vitals were 70/32, HR 76, O2 97% and resp 12-15 CBG 92. 0515 Dr. Arnoldo Morale assessed pt and decided to transfer her to the acute side stepdown. BP now 88/66 manually at 0523. Dr. Letta Pate personally assessed patient. Labs drawn.  Pt became more alert, was able to tell RN she was in the hospital but could not remember what happened in the bathroom. Speech and all extremities strength at baseline.  7619 report called to 3S. Patient transported to 3S room 8 by RN and Jerene Pitch, rapid response. Husband and baby at bedside. All belongings sent to 3S with patient and husband. Husband was very cooperative and supportive in the situation doing the best he could for mom and baby; emotional support given at time of event and explanation as to wife's current situation. Kennieth Francois, RN

## 2015-06-02 NOTE — Significant Event (Addendum)
Rapid Response Event Note Called per floor RN for witnessed seizure while in bathroom. Pt voided and was placed on lift device when she started seizing. PEr NT Pt seized for about 2 minutes. Transferred back to bed and RRT called.  Overview: Time Called: [id number] Arrival Time: 0440 Event Type: Cardiac, Neurologic  Initial Focused Assessment: Upon my arrival Pt found resting in bed on left side. Appears post ictal, lethargic, eyes open to voice, makes minmal eye contact. Unable to follow commands. With draws to pain in all four extremeties, weakness noted on right side. Lungs clear, Po2 100% on 2 LNC,  RR 12-15. BP low, initial reading 80/41 In left arm, checked in right arm yielding  62/47 . CBG 92.  Interventions: New PIV started in left hand. Dr. Letta Pate paged and updated per floor RN regarding this morning events and current VS. Fluid bolus ordered and started. /MD to consult [facility] to admit. BP obtained after initial 250 NS bolus completed at 0510 yielding 80/43, manual BP 89/62. Dr. Arnoldo Morale at bedside 0515, Pt for SDU transfer/admit.    0545-Pt more awake, follows commands, expressive aphasia, per floor RN this is her baseline. BP improved 90/54. Report called to [place]. Pt prepared for transfer. Keppra loading bolus given prior to transfer. Pt transfered to [facility] room 8 at 0610  Event Summary: Name of Physician Notified: Dr. Letta Pate at [phone]    at          [place], [place]

## 2015-06-02 NOTE — Plan of Care (Signed)
Problem: RH BOWEL ELIMINATION Goal: RH STG MANAGE BOWEL WITH ASSISTANCE STG Manage Bowel with Assistance. Min assist Outcome: Not Progressing incont episode of bowel   Problem: RH BLADDER ELIMINATION Goal: RH STG MANAGE BLADDER WITH ASSISTANCE STG Manage Bladder With minimal Assistance  Outcome: Not Progressing incont episode x1 last night

## 2015-06-02 NOTE — Progress Notes (Signed)
Subjective/Complaints: Called regarding seizure and low BP, taking to bathroom byCNA, voided and when stood up first Left arm started to shake then right arm, "eyes rolled back" per NA, assisted to bed .  RN checked BP at 70s over 30s. Rapid response called   Fluid bolus .9 NS at 225m/hr given Stat BME and CBC ordered  Per husband pt was eating well (best day) yesterday but fluid intake still poor  Has been treated for UTI, Kleb sens to keflex Central line out several days ago, site looked ok  Zanaflex given x 2 last noc for spasticity  ROS cannot obtain due to MS, aphasia Objective: Vital Signs: Blood pressure 88/66, pulse 77, temperature 99.2 F (37.3 C), temperature source Rectal, resp. rate 16, height _0  (1.6 m), weight 66.6 kg (146 lb 13.2 oz), SpO2 98 %, unknown if currently breastfeeding. No results found. Results for orders placed or performed during the hospital encounter of 05/26/15 (from the past 72 hour(s))  Urinalysis, [facility] w reflex [facility]     Status: Abnormal   Collection Time: 05/30/15  4:28 PM  Result Value Ref Range   Color, Urine YELLOW YELLOW   APPearance TURBID (A) CLEAR   Specific Gravity, Urine 1.007 1.005 - 1.030   pH 8.0 5.0 - 8.0   Glucose, UA NEGATIVE NEGATIVE mg/dL   Hgb urine dipstick LARGE (A) NEGATIVE   Bilirubin Urine NEGATIVE NEGATIVE   Ketones, ur NEGATIVE NEGATIVE mg/dL   Protein, ur NEGATIVE NEGATIVE mg/dL   Urobilinogen, UA 0.2 0.0 - 1.0 mg/dL   Nitrite POSITIVE (A) NEGATIVE   Leukocytes, UA MODERATE (A) NEGATIVE  Urine culture     Status: None   Collection Time: 05/30/15  4:28 PM  Result Value Ref Range   Specimen Description URINE, RANDOM    Special Requests NONE    Culture >=100,000 COLONIES/mL KLEBSIELLA PNEUMONIAE    Report Status 06/01/2015 FINAL    Organism ID, Bacteria KLEBSIELLA PNEUMONIAE       Susceptibility   Klebsiella pneumoniae - MIC*    AMPICILLIN >=32 RESISTANT Resistant     CEFAZOLIN <=4  SENSITIVE Sensitive     CEFTRIAXONE <=1 SENSITIVE Sensitive     CIPROFLOXACIN <=0.25 SENSITIVE Sensitive     GENTAMICIN <=1 SENSITIVE Sensitive     IMIPENEM <=0.25 SENSITIVE Sensitive     NITROFURANTOIN 64 INTERMEDIATE Intermediate     TRIMETH/SULFA <=20 SENSITIVE Sensitive     AMPICILLIN/SULBACTAM 4 SENSITIVE Sensitive     PIP/TAZO <=4 SENSITIVE Sensitive     * >=100,000 COLONIES/mL KLEBSIELLA PNEUMONIAE  Urine microscopic-add on     Status: Abnormal   Collection Time: 05/30/15  4:28 PM  Result Value Ref Range   Squamous Epithelial / LPF RARE RARE   WBC, UA 11-20 <3 WBC/hpf   RBC / HPF 21-50 <3 RBC/hpf   Bacteria, UA MANY (A) RARE   Casts GRANULAR CAST (A) NEGATIVE   Urine-Other AMORPHOUS URATES/PHOSPHATES   Basic metabolic panel     Status: Abnormal   Collection Time: 06/01/15  6:05 AM  Result Value Ref Range   Sodium 135 135 - 145 mmol/L   Potassium 4.1 3.5 - 5.1 mmol/L   Chloride 98 (L) 101 - 111 mmol/L   CO2 29 22 - 32 mmol/L   Glucose, Bld 99 65 - 99 mg/dL   BUN 6 6 - 20 mg/dL   Creatinine, Ser 0.50 0.44 - 1.00 mg/dL   Calcium 9.8 8.9 - 10.3 mg/dL   GFR  calc non Af Amer >60 >60 mL/min   GFR calc Af Amer >60 >60 mL/min    Comment: (NOTE) The eGFR has been calculated using the CKD EPI equation. This calculation has not been validated in all clinical situations. eGFR's persistently <60 mL/min signify possible Chronic Kidney Disease.    Anion gap 8 5 - 15  Valproic acid level     Status: Abnormal   Collection Time: 06/01/15  9:50 AM  Result Value Ref Range   Valproic Acid Lvl <10 (L) 50.0 - 100.0 ug/mL    Comment: RESULTS CONFIRMED BY MANUAL DILUTION  Creatinine, serum     Status: None   Collection Time: 06/02/15 12:14 AM  Result Value Ref Range   Creatinine, Ser 0.47 0.44 - 1.00 mg/dL   GFR calc non Af Amer >60 >60 mL/min   GFR calc Af Amer >60 >60 mL/min    Comment: (NOTE) The eGFR has been calculated using the CKD EPI equation. This calculation has not been  validated in all clinical situations. eGFR's persistently <60 mL/min signify possible Chronic Kidney Disease.      HEENT: normal Cardio: RRR and no murmur Resp: CTA B/L and unlabored GI: BS positive and NT, ND Extremity:  BS positive and NT, ND Skin:   Central line site healed over Neuro: Alert/Oriented, Abnormal Sensory reduced sensation to pinch , Left side 4-5/5, Abnormal Motor 0/5 RUE, Trace knee ext RLE and Aphasic,  Musc/Skel:  Other no pain with UE or LE ROM Gen NAD   Assessment/Plan: 1. Functional deficits secondary to  Right flaccid hemiplegia,dysphagia, and aphasia secondary to large left parietal intracranial hemorrhage  seizxure with hypotension  Difficult to say if pt became hypotensive and seized or vice versa   Discussed with Int med hospitalist, will transfer to step down  Discussed with neuro hospitalist- rec additional Keppra, total 1561m BID  Will re eval pt for rehab readmission once stabilized  Discussed with husband  FIM: Function - Bathing Position: Bed Body parts bathed by patient: Left upper leg, Left lower leg, Right upper leg, Right lower leg, Front perineal area Body parts bathed by helper: Buttocks Bathing not applicable: Abdomen, Chest, Left arm, Right arm Assist Level: Touching or steadying assistance(Pt > 75%) Set up : To open containers, To obtain items, To adjust water temperature  Function- Upper Body Dressing/Undressing What is the patient wearing?: Pull over shirt/dress Pull over shirt/dress - Perfomed by patient: Put head through opening, Thread/unthread left sleeve Pull over shirt/dress - Perfomed by helper: Thread/unthread right sleeve, Pull shirt over trunk Assist Level: Touching or steadying assistance(Pt > 75%) Function - Lower Body Dressing/Undressing Lower body dressing/undressing activity did not occur: 1: Total-Patient completed less than 25% of tasks What is the patient wearing?: Underwear, Pants, Socks Position:  Bed Underwear - Performed by helper: Thread/unthread right underwear leg, Thread/unthread left underwear leg, Pull underwear up/down Pants- Performed by patient: Thread/unthread left pants leg, Pull pants up/down Pants- Performed by helper: Thread/unthread right pants leg Non-skid slipper socks- Performed by helper: Don/doff left sock Socks - Performed by patient: Don/doff left sock Socks - Performed by helper: Don/doff right sock Shoes - Performed by patient: Don/doff left shoe Shoes - Performed by helper: Don/doff right shoe, Fasten right, Fasten left Assist Level: 2 Helpers, Touching or steadying assistance (Pt > 75%), Supervision or verbal cues Set up : To obtain clothing/put away  Function - Toileting Toileting activity did not occur: No continent bowel/bladder event Toileting steps completed by patient: Adjust clothing prior  to toileting, Performs perineal hygiene, Adjust clothing after toileting Toileting steps completed by helper: Adjust clothing prior to toileting, Performs perineal hygiene, Adjust clothing after toileting Toileting Assistive Devices: Grab bar or rail, Other (comment) (stedy) Assist level: Two helpers  Function - Air cabin crew transfer activity did not occur: Risk analyst transfer assistive device: Facilities manager lift: [place] level to toilet: 2 helpers Assist level from toilet: 2 helpers Assist level to bedside commode (at bedside): 2 helpers Assist level from bedside commode (at bedside): 2 helpers  Function - Chair/bed transfer Chair/bed transfer Grant: Squat pivot Chair/bed transfer assist level: Moderate assist (Pt 50 - 74%/lift or lower) Chair/bed transfer assistive device: Bedrails, Armrests Chair/bed transfer details: Verbal cues for technique, Visual cues/gestures for sequencing, Manual facilitation for weight shifting, Manual facilitation for placement  Function - Locomotion: Wheelchair Will patient use wheelchair at  discharge?: Yes Type: Manual Max wheelchair distance: 150 Assist Level: Supervision or verbal cues Assist Level: Supervision or verbal cues Assist Level: Dependent (Pt equals 0%) Function - Locomotion: Ambulation Assistive device: Parallel bars Max distance: 15 Assist level: 2 helpers Assist level: 2 helpers Walk 50 feet with 2 turns activity did not occur: Safety/medical concerns  Function - Comprehension Comprehension: Auditory Comprehension assist level: Understands basic 50 - 74% of the time/ requires cueing 25 - 49% of the time  Function - Expression Expression: Verbal Expression assist level: Expresses basic 25 - 49% of the time/requires cueing 50 - 75% of the time. Uses single words/gestures.  Function - Social Interaction Social Interaction assist level: Interacts appropriately 50 - 74% of the time - May be physically or verbally inappropriate.  Function - Problem Solving Problem solving assist level: Solves basic 25 - 49% of the time - needs direction more than half the time to initiate, plan or complete simple activities  Function - Memory Memory assist level: Recognizes or recalls less than 25% of the time/requires cueing greater than 75% of the time Patient normally able to recall (first 3 days only): That he or she is in a hospital  Medical Problem List and Plan: 1. Functional deficits secondary to Right flaccid hemiplegia,dysphagia, and aphasia secondary to large left parietal intracranial hemorrhage, also severe cog def, spasticity increasing now on Zanaflex will allow Repeat x1 2. DVT Prophylaxis/Anticoagulation: Pharmaceutical: Lovenox 3. Pain Management: Tylenol prn 4. Mood: LCSW to follow for evaluation and support as cognition/aphasia improves.  5. Neuropsych: This patient is not capable of making decisions on her own behalf. 6. Skin/Wound Care: Routine pressure relief measures.  7. Fluids/Electrolytes/Nutrition: Monitor I/O. 50% meals yesterday, about  500 cc fluid recorded. STAT  b met, may need nighttime fluids ongpoing Currently needs fluid resuscitation and SDU monitoring 8. Insomnia?:and agitation , didn't receive trazodone on Monday, make  Trazodone prn, cont qhs  seroquel 9. New onset seizures: with breakthrough On keppra bid.but very low valproate level, ,10. HTN: Monitor BP every 8 hours. Being treated prn.   11. Urinary frequency Foley, UA positive for UTI , Kleb P seen on urine culture, sens to Keflex, discussed with IM, they will do sepsis w/u LOS (Days) 7 A FACE TO FACE EVALUATION WAS PERFORMED  KIRSTEINS,ANDREW E 06/02/2015, 5:55 AM

## 2015-06-02 NOTE — Progress Notes (Addendum)
Patient admitted after midnight, please see H&P.  Not sure of cause of hypotension--- recent UTI-- continue abx-- await blood cultures Seizures- IV keppra- appears patient was on 1000mg  BID before incident -head CT  Not hypoxic (99% on RA) so doubt PE and would be difficult to anticoagulate as recent bleed  Patient, husband and newborn updated Ashley [name]  3:22 PM Late update- patient much more awake, will start diet and PO meds, asked pharmacy to verify meds from Rehab -husband relayed seroquel 25 mg QHS around 9 or 930 and another at 130 AM

## 2015-06-02 NOTE — H&P (Addendum)
[facility] Admission History and Physical       Ashley [name] KPV:[id number] DOB: 1975/10/16 DOA: (Not on file)  Referring physician: Dr. Letta Pate PCP: No PCP Per Patient  Specialists:   Chief Complaint: Low Blood Pressure and Seizure  HPI: Ashley [name] is a 39 y.o. female admitted on 08/07 with a Postpartum SAH/ICH of Left Fronto-Parietal area with Residual Symptoms of Right Hemiparesis, Aphasia, Dysphagia and Seizures who had been discharged to the Rehab Unit on 08/15 for Rehab Therapy and this AM around 0445 she was being assisted to the bathroom to void, and was witnessed as having her eyes roll back and tonic clonic activity that lasted 2 minutes.   Rapid Response was called and she was found to have hypotension with a blood pressure of 70/43.   She was administered a 250cc IV Bolus and had improvement in her pressure per orders of Dr. Letta Pate, and the [facility] were called to assess.  On assessment the patient was found to be Obtunded, and her O2 saturations were found to be 98%, and a spot check Glucose was 92 and an improving blood pressure ith a systolic of  89.  A 500 mg loading dose of IV Keppra was ordered and administered.    Arrangements were made to admit to the [facility] Unit for further evaluation and treatment and Neurology was consulted.   A Ct scan of the head has been ordered to be performed once her blood pressure stabilizes.      Review of Systems: Unable to Obtain from the Patient   Past Medical History  Diagnosis Date  . Anxiety        Post partum x 3 weeks   Past Surgical History  Procedure Laterality Date  . Dilation and curettage of uterus        Prior to Admission medications   Medication Sig Start Date End Date Taking? Authorizing Provider  acetaminophen (TYLENOL) 500 MG tablet Take 1,000 mg by mouth every 6 (six) hours as needed for headache.    Historical Provider, MD  acyclovir (ZOVIRAX) 400 MG tablet Take 400 mg by mouth 5  (five) times daily as needed (for breakout).     Historical Provider, MD  ampicillin (PRINCIPEN) 500 MG capsule Take 500 mg by mouth 2 (two) times daily as needed. 04/29/15   Historical Provider, MD  aspirin-acetaminophen-caffeine (EXCEDRIN MIGRAINE) [id number] MG per tablet Take 1-2 tablets by mouth every 6 (six) hours as needed for headache or migraine.    Historical Provider, MD  docusate sodium (COLACE) 100 MG capsule Take 100 mg by mouth daily.    Historical Provider, MD  FLUoxetine (PROZAC) 40 MG capsule Take 40 mg by mouth daily.    Historical Provider, MD  ibuprofen (ADVIL,MOTRIN) 600 MG tablet Take 1 tablet (600 mg total) by mouth every 6 (six) hours as needed for moderate pain or cramping. 05/09/15   Jerelyn Charles, MD  Prenatal Vit-Fe Fumarate-FA (PRENATAL MULTIVITAMIN) TABS tablet Take 1 tablet by mouth daily at 12 noon.    Historical Provider, MD     No Known Allergies  Social History:  reports that she has never smoked. She has never used smokeless tobacco. She reports that she drinks alcohol. She reports that she does not use illicit drugs.    Family History  Problem Relation Age of Onset  . Cancer Mother   . Cancer Father        Physical Exam:  GEN:  Obtunded Well Nourished and Well Developed  39 y.o. Caucasian female examined and in no acute distress;  (She has been breast feeding)  PSYCH: She is Obtunded;  HEENT: Normocephalic and Atraumatic, Mucous membranes pink; PERRLA; EOM intact; Fundi:  Benign;  No scleral icterus, Nares: Patent, Oropharynx: Clear, Fair Dentition,    Neck:  FROM, No Cervical Lymphadenopathy nor Thyromegaly or Carotid Bruit; No JVD; Breasts:: Not examined CHEST WALL: No tenderness CHEST: Normal respiration, clear to auscultation bilaterally HEART: Regular rate and rhythm; no murmurs rubs or gallops BACK: No kyphosis or scoliosis; No CVA tenderness ABDOMEN: Positive Bowel Sounds, Soft Non-Tender, No Rebound or Guarding; No Masses, No  Organomegaly Rectal Exam: Not done EXTREMITIES: No Cyanosis, Clubbing, or Edema; No Ulcerations. Genitalia: not examined PULSES: 2+ and symmetric SKIN: Normal hydration no rash or ulceration CNS:  Alert and Oriented x 0   Obtunded, Rt Hemiparesis Vascular: pulses palpable throughout    Labs on Admission:  Basic Metabolic Panel:  Recent Labs Lab 05/26/15 1015 05/27/15 0650 05/29/15 0540 06/01/15 0605 06/02/15 0014  NA 140 135 137 135  --   K 3.0* 3.1* 3.9 4.1  --   CL 102 101 103 98*  --   CO2 29 27 27 29   --   GLUCOSE 107* 98 95 99  --   BUN 14 8 <5* 6  --   CREATININE 0.54 0.37* 0.39* 0.50 0.47  CALCIUM 7.7* 8.0* 8.6* 9.8  --    Liver Function Tests:  Recent Labs Lab 05/27/15 0650  AST 36  ALT 30  ALKPHOS 64  BILITOT 0.9  PROT 5.0*  ALBUMIN 2.7*   No results for input(s): LIPASE, AMYLASE in the last 168 hours. No results for input(s): AMMONIA in the last 168 hours. CBC:  Recent Labs Lab 05/27/15 0650  WBC 10.9*  NEUTROABS 8.3*  HGB 12.6  HCT 37.4  MCV 87.6  PLT 157   Cardiac Enzymes: No results for input(s): CKTOTAL, CKMB, CKMBINDEX, TROPONINI in the last 168 hours.  BNP (last 3 results) No results for input(s): BNP in the last 8760 hours.  ProBNP (last 3 results) No results for input(s): PROBNP in the last 8760 hours.  CBG: No results for input(s): GLUCAP in the last 168 hours.  Radiological Exams on Admission: No results found.   EKG: Independently reviewed.    Assessment/Plan:   39 y.o. female with  Active Problems:   1.   Hypotension- Probable Sepsis, and is responding to fluids   Sepsis workup initiated   IVFs for fluid Resuscitation   Monitor BPs     2.   Seizure- due to #1 or Sepsis or CNS event   Seizure Precautions   NPO   IV Loaded with additional 500 mg IV Keppra    And then increased Keppra to 1500mg  IV q 12 hrs   Neuro checks   Neurology Consulted   Ct scan of Head ordered    3.   Sepsis-  Has a UTI,and had a  recent [employer] removed.      Sepsis Workup initiated   IV Vanc and Zosyn   Stepdown Monitoring     4.   UTI- Recent UTI : Urine Culture 08/17 Results+Klebsiella susceptible to Cephazolin and Zosyn and placed on Keflex    Urine C+S sent   Now on IV Vancomycin and Zosyn     5.   Recent SAH/ICH   Undergoing Rehab Rx            6.    DVT prophylaxis  SCDs   7.  Other LAbs ordered and Pending.     Code Status:     FULL CODE    Family Communication:   Husband and Infant at Bedside      Disposition Plan:    Inpatient  Status        Time spent:  [place] [facility] Pager [phone]   If [place] Please Contact the Day Rounding Team MD for [facility]  If 7PM-7AM, Please Contact Night-Floor Coverage  [email or web address] Password [email or web address] 06/02/2015, 6:05 AM     ADDENDUM:   Patient was seen and examined on 06/02/2015

## 2015-06-02 NOTE — Progress Notes (Signed)
ANTIBIOTIC CONSULT NOTE - INITIAL  Pharmacy Consult for Vancomycin/Zosyn  Indication: rule out sepsis  No Known Allergies  Vital Signs: Temp: 99.2 F (37.3 C) (08/20 0445) Temp Source: Rectal (08/20 0445) BP: 88/66 mmHg (08/20 0523) Pulse Rate: 77 (08/20 0511)  Labs:  Recent Labs  06/01/15 0605 06/02/15 0014 06/02/15 0600  WBC  --   --  7.3  HGB  --   --  13.0  PLT  --   --  416*  CREATININE 0.50 0.47  --    Estimated Creatinine Clearance: 86.6 mL/min (by C-G formula based on Cr of 0.47).   Microbiology: Recent Results (from the past 720 hour(s))  MRSA PCR Screening     Status: None   Collection Time: 05/21/15  3:00 AM  Result Value Ref Range Status   MRSA by PCR NEGATIVE NEGATIVE Final    Comment:        The GeneXpert MRSA Assay (FDA approved for NASAL specimens only), is one component of a comprehensive MRSA colonization surveillance program. It is not intended to diagnose MRSA infection nor to guide or monitor treatment for MRSA infections.   Urine culture     Status: None   Collection Time: 05/30/15  4:28 PM  Result Value Ref Range Status   Specimen Description URINE, RANDOM  Final   Special Requests NONE  Final   Culture >=100,000 COLONIES/mL KLEBSIELLA PNEUMONIAE  Final   Report Status 06/01/2015 FINAL  Final   Organism ID, Bacteria KLEBSIELLA PNEUMONIAE  Final      Susceptibility   Klebsiella pneumoniae - MIC*    AMPICILLIN >=32 RESISTANT Resistant     CEFAZOLIN <=4 SENSITIVE Sensitive     CEFTRIAXONE <=1 SENSITIVE Sensitive     CIPROFLOXACIN <=0.25 SENSITIVE Sensitive     GENTAMICIN <=1 SENSITIVE Sensitive     IMIPENEM <=0.25 SENSITIVE Sensitive     NITROFURANTOIN 64 INTERMEDIATE Intermediate     TRIMETH/SULFA <=20 SENSITIVE Sensitive     AMPICILLIN/SULBACTAM 4 SENSITIVE Sensitive     PIP/TAZO <=4 SENSITIVE Sensitive     * >=100,000 COLONIES/mL KLEBSIELLA PNEUMONIAE    Medical History: Past Medical History  Diagnosis Date  . Anxiety   .  ICH (intracerebral hemorrhage)    Assessment: 39 y/o tx from inpatient rehab after witnessed seizure. Pt hypotensive. Tmax 99.2. Starting anti-biotics for r/o sepsis. WBC WNL. Renal function good. Other labs reviewed.   Goal of Therapy:  Vancomycin trough level 15-20 mcg/ml  Plan:  -Vancomycin 1000 mg IV q12h -Zosyn 3.375G IV q8h to be infused over 4 hours -Trend WBC, temp, renal function -Drug levels as indicated -F/U infectious work-up, hopefully DC if no signs of infection  Narda Bonds 06/02/2015,6:47 AM

## 2015-06-03 ENCOUNTER — Encounter (HOSPITAL_COMMUNITY): Payer: Self-pay | Admitting: *Deleted

## 2015-06-03 ENCOUNTER — Inpatient Hospital Stay (HOSPITAL_COMMUNITY): Payer: BLUE CROSS/BLUE SHIELD | Admitting: Occupational Therapy

## 2015-06-03 DIAGNOSIS — I9589 Other hypotension: Secondary | ICD-10-CM

## 2015-06-03 DIAGNOSIS — N390 Urinary tract infection, site not specified: Secondary | ICD-10-CM

## 2015-06-03 DIAGNOSIS — G40909 Epilepsy, unspecified, not intractable, without status epilepticus: Secondary | ICD-10-CM

## 2015-06-03 DIAGNOSIS — N3000 Acute cystitis without hematuria: Secondary | ICD-10-CM

## 2015-06-03 DIAGNOSIS — A419 Sepsis, unspecified organism: Secondary | ICD-10-CM | POA: Diagnosis present

## 2015-06-03 HISTORY — DX: Urinary tract infection, site not specified: N39.0

## 2015-06-03 HISTORY — DX: Sepsis, unspecified organism: A41.9

## 2015-06-03 LAB — BASIC METABOLIC PANEL
ANION GAP: 9 (ref 5–15)
BUN: 7 mg/dL (ref 6–20)
CALCIUM: 8.6 mg/dL — AB (ref 8.9–10.3)
CO2: 28 mmol/L (ref 22–32)
CREATININE: 0.65 mg/dL (ref 0.44–1.00)
Chloride: 98 mmol/L — ABNORMAL LOW (ref 101–111)
Glucose, Bld: 94 mg/dL (ref 65–99)
Potassium: 3.9 mmol/L (ref 3.5–5.1)
Sodium: 135 mmol/L (ref 135–145)

## 2015-06-03 LAB — CBC
HCT: 38.8 % (ref 36.0–46.0)
Hemoglobin: 12.6 g/dL (ref 12.0–15.0)
MCH: 29.6 pg (ref 26.0–34.0)
MCHC: 32.5 g/dL (ref 30.0–36.0)
MCV: 91.1 fL (ref 78.0–100.0)
PLATELETS: 403 10*3/uL — AB (ref 150–400)
RBC: 4.26 MIL/uL (ref 3.87–5.11)
RDW: 13.7 % (ref 11.5–15.5)
WBC: 6.8 10*3/uL (ref 4.0–10.5)

## 2015-06-03 MED ORDER — FLUCONAZOLE 100 MG PO TABS
200.0000 mg | ORAL_TABLET | Freq: Every day | ORAL | Status: AC
  Administered 2015-06-03 – 2015-06-04 (×2): 200 mg via ORAL
  Filled 2015-06-03 (×2): qty 2

## 2015-06-03 MED ORDER — SULFAMETHOXAZOLE-TRIMETHOPRIM 800-160 MG PO TABS
1.0000 | ORAL_TABLET | Freq: Two times a day (BID) | ORAL | Status: DC
  Administered 2015-06-03 – 2015-06-04 (×2): 1 via ORAL
  Filled 2015-06-03 (×2): qty 1

## 2015-06-03 NOTE — Progress Notes (Signed)
[facility] PROGRESS NOTE  Ashley [name] UKG:[id number] DOB: Jun 02, 1976 DOA: 06/02/2015 PCP: No PCP Per Patient  Assessment/Plan:  Principal Problem:   Seizure disorder: none further. Now on keppra 1500 bid. Transfer out of SDU Active Problems:   ICH (intracerebral hemorrhage): back to rehab 1-2 days if stable. Resume PT, OT   Hypotension resolved. Saline lock and monitor vitals   Sepsis: BC neg to date   UTI (urinary tract infection), Klebsiella. D/c vanc, zosyn and change to PO bactrim. monitor  HPI/Subjective: Sleepy. Per mother, ate breakfast, slept well overnight  Objective: Filed Vitals:   06/03/15 0700  BP: 104/64  Pulse:   Temp: 97.3 F (36.3 C)  Resp:     Intake/Output Summary (Last 24 hours) at 06/03/15 1032 Last data filed at 06/03/15 0707  Gross per 24 hour  Intake   3600 ml  Output    350 ml  Net   3250 ml   Filed Weights   06/02/15 0700  Weight: 64.5 kg (142 lb 3.2 oz)    Exam:   General:  Asleep. Arousable. Falls back asleep. Follows commands  Cardiovascular: RRR without MGR  Respiratory: CTA without WRR  Abdomen: S, NT, ND  Ext: no CCE  Neuro: right sided weakness  Basic Metabolic Panel:  Recent Labs Lab 05/29/15 0540 06/01/15 0605 06/02/15 0014 06/02/15 0600 06/02/15 1245 06/03/15 0300  NA 137 135  --  135  --  135  K 3.9 4.1  --  4.4  --  3.9  CL 103 98*  --  100*  --  98*  CO2 27 29  --  26  --  28  GLUCOSE 95 99  --  93  --  94  BUN <5* 6  --  9  --  7  CREATININE 0.39* 0.50 0.47 0.56  --  0.65  CALCIUM 8.6* 9.8  --  9.0  --  8.6*  MG  --   --   --   --  1.8  --   PHOS  --   --   --   --  4.3  --    Liver Function Tests:  Recent Labs Lab 06/02/15 0600  AST 30  ALT 46  ALKPHOS 75  BILITOT 0.5  PROT 5.7*  ALBUMIN 3.2*   No results for input(s): LIPASE, AMYLASE in the last 168 hours. No results for input(s): AMMONIA in the last 168 hours. CBC:  Recent Labs Lab 06/02/15 0600 06/02/15 0900  06/03/15 0300  WBC 7.3 7.4 6.8  NEUTROABS 4.6 5.3  --   HGB 13.0 12.8 12.6  HCT 39.9 39.8 38.8  MCV 89.3 90.0 91.1  PLT 416* 422* 403*   Cardiac Enzymes: No results for input(s): CKTOTAL, CKMB, CKMBINDEX, TROPONINI in the last 168 hours. BNP (last 3 results) No results for input(s): BNP in the last 8760 hours.  ProBNP (last 3 results) No results for input(s): PROBNP in the last 8760 hours.  CBG:  Recent Labs Lab 06/02/15 0522  GLUCAP 92    Recent Results (from the past 240 hour(s))  Urine culture     Status: None   Collection Time: 05/30/15  4:28 PM  Result Value Ref Range Status   Specimen Description URINE, RANDOM  Final   Special Requests NONE  Final   Culture >=100,000 COLONIES/mL KLEBSIELLA PNEUMONIAE  Final   Report Status 06/01/2015 FINAL  Final   Organism ID, Bacteria KLEBSIELLA PNEUMONIAE  Final      Susceptibility  Klebsiella pneumoniae - MIC*    AMPICILLIN >=32 RESISTANT Resistant     CEFAZOLIN <=4 SENSITIVE Sensitive     CEFTRIAXONE <=1 SENSITIVE Sensitive     CIPROFLOXACIN <=0.25 SENSITIVE Sensitive     GENTAMICIN <=1 SENSITIVE Sensitive     IMIPENEM <=0.25 SENSITIVE Sensitive     NITROFURANTOIN 64 INTERMEDIATE Intermediate     TRIMETH/SULFA <=20 SENSITIVE Sensitive     AMPICILLIN/SULBACTAM 4 SENSITIVE Sensitive     PIP/TAZO <=4 SENSITIVE Sensitive     * >=100,000 COLONIES/mL KLEBSIELLA PNEUMONIAE     Studies: Ct Head Wo Contrast  06/02/2015   CLINICAL DATA:  Seizure activity  EXAM: CT HEAD WITHOUT CONTRAST  TECHNIQUE: Contiguous axial images were obtained from the base of the skull through the vertex without intravenous contrast.  COMPARISON:  05/25/2015  FINDINGS: Bony calvarium is intact. Persistent hemorrhage is noted within the parenchyma of the frontal parietal lobe on the left. Some extension into the corpus callosum is noted. Mild midline shift of approximately 4 mm from left to right is noted. This is relatively stable from the previous exam.  Surrounding edema is noted. No new focal areas of acute hemorrhage, acute infarction or space-occupying mass lesion are noted. The degree of residual blood within the occipital horns of the lateral ventricles has decreased.  IMPRESSION: Persistent but somewhat resolving hemorrhage in the left frontoparietal region. No new focal abnormality is seen.   Electronically Signed   By: Inez Catalina M.D.   On: 06/02/2015 10:22   Dg Chest Port 1 View  06/02/2015   CLINICAL DATA:  Sepsis  EXAM: PORTABLE CHEST - 1 VIEW  COMPARISON:  05/21/2015  FINDINGS: The heart size and mediastinal contours are within normal limits. Both lungs are clear. The visualized skeletal structures are unremarkable.  IMPRESSION: No active disease.   Electronically Signed   By: Inez Catalina M.D.   On: 06/02/2015 07:02    Scheduled Meds: . docusate sodium  100 mg Oral Daily  . levETIRAcetam  1,500 mg Oral BID  . piperacillin-tazobactam (ZOSYN)  IV  3.375 g Intravenous 3 times per day  . QUEtiapine  25 mg Oral QHS  . vancomycin  1,000 mg Intravenous Q12H   Continuous Infusions: . sodium chloride 125 mL/hr at 06/03/15 0258    Time spent: 35 minutes  [facility] [email or web address], password [facility] 06/03/2015, 10:32 AM  LOS: 1 day

## 2015-06-03 NOTE — Progress Notes (Signed)
Subjective: Patient awake and alert.  No further seizure activity noted.  Tolerating increase in Keppra.    Objective: Current vital signs: BP 104/64 mmHg  Pulse 81  Temp(Src) 97.3 F (36.3 C) (Axillary)  Resp 20  Ht 5\' 3"  (1.6 m)  Wt 64.5 kg (142 lb 3.2 oz)  BMI 25.20 kg/m2  SpO2 98% Vital signs in last 24 hours: Temp:  [97.3 F (36.3 C)-98.2 F (36.8 C)] 97.3 F (36.3 C) (08/21 0700) Pulse Rate:  [79-91] 81 (08/21 1100) Resp:  [13-20] 20 (08/21 1100) BP: (102-119)/(56-64) 104/64 mmHg (08/21 0700) SpO2:  [97 %-100 %] 98 % (08/21 1100)  Intake/Output from previous day: 08/20 0701 - 08/21 0700 In: 3650 [I.V.:2850; IV Piggyback:800] Out: -  Intake/Output this shift: Total I/O In: 1013.3 [P.O.:480; I.V.:533.3] Out: 350 [Urine:350] Nutritional status: Diet regular Room service appropriate?: Yes; Fluid consistency:: Thin  Neurologic Exam: Mental Status: Alert and awake.  Much more fluent.   Able to follow commands Cranial Nerves: II: Discs flat bilaterally; Pupils equal, round, reactive to light and accommodation III,IV, VI: ptosis not present, extra-ocular motions intact bilaterally V,VII: right facial droop VIII: hearing normal bilaterally IX,X: gag reflex present XI: decreased right shoulder shrug XII: unable to perform Motor: Right hemiplegia but able to make a fist on the right and release. Moves left upper and lower extremity against gravity.  Deep Tendon Reflexes: 2+ and symmetric throughout  Lab Results: Basic Metabolic Panel:  Recent Labs Lab 05/29/15 0540 06/01/15 0605 06/02/15 0014 06/02/15 0600 06/02/15 1245 06/03/15 0300  NA 137 135  --  135  --  135  K 3.9 4.1  --  4.4  --  3.9  CL 103 98*  --  100*  --  98*  CO2 27 29  --  26  --  28  GLUCOSE 95 99  --  93  --  94  BUN <5* 6  --  9  --  7  CREATININE 0.39* 0.50 0.47 0.56  --  0.65  CALCIUM 8.6* 9.8  --  9.0  --  8.6*  MG  --   --   --   --  1.8  --   PHOS  --   --   --   --  4.3  --      Liver Function Tests:  Recent Labs Lab 06/02/15 0600  AST 30  ALT 46  ALKPHOS 75  BILITOT 0.5  PROT 5.7*  ALBUMIN 3.2*   No results for input(s): LIPASE, AMYLASE in the last 168 hours. No results for input(s): AMMONIA in the last 168 hours.  CBC:  Recent Labs Lab 06/02/15 0600 06/02/15 0900 06/03/15 0300  WBC 7.3 7.4 6.8  NEUTROABS 4.6 5.3  --   HGB 13.0 12.8 12.6  HCT 39.9 39.8 38.8  MCV 89.3 90.0 91.1  PLT 416* 422* 403*    Cardiac Enzymes: No results for input(s): CKTOTAL, CKMB, CKMBINDEX, TROPONINI in the last 168 hours.  Lipid Panel: No results for input(s): CHOL, TRIG, HDL, CHOLHDL, VLDL, LDLCALC in the last 168 hours.  CBG:  Recent Labs Lab 06/02/15 0522  GLUCAP 57    Microbiology: Results for orders placed or performed during the hospital encounter of 05/26/15  Urine culture     Status: None   Collection Time: 05/30/15  4:28 PM  Result Value Ref Range Status   Specimen Description URINE, RANDOM  Final   Special Requests NONE  Final   Culture >=100,000 COLONIES/mL KLEBSIELLA PNEUMONIAE  Final   Report Status 06/01/2015 FINAL  Final   Organism ID, Bacteria KLEBSIELLA PNEUMONIAE  Final      Susceptibility   Klebsiella pneumoniae - MIC*    AMPICILLIN >=32 RESISTANT Resistant     CEFAZOLIN <=4 SENSITIVE Sensitive     CEFTRIAXONE <=1 SENSITIVE Sensitive     CIPROFLOXACIN <=0.25 SENSITIVE Sensitive     GENTAMICIN <=1 SENSITIVE Sensitive     IMIPENEM <=0.25 SENSITIVE Sensitive     NITROFURANTOIN 64 INTERMEDIATE Intermediate     TRIMETH/SULFA <=20 SENSITIVE Sensitive     AMPICILLIN/SULBACTAM 4 SENSITIVE Sensitive     PIP/TAZO <=4 SENSITIVE Sensitive     * >=100,000 COLONIES/mL KLEBSIELLA PNEUMONIAE    Coagulation Studies:  Recent Labs  06/02/15 0900  LABPROT 13.9  INR 1.05    Imaging: Ct Head Wo Contrast  06/02/2015   CLINICAL DATA:  Seizure activity  EXAM: CT HEAD WITHOUT CONTRAST  TECHNIQUE: Contiguous axial images were obtained  from the base of the skull through the vertex without intravenous contrast.  COMPARISON:  05/25/2015  FINDINGS: Bony calvarium is intact. Persistent hemorrhage is noted within the parenchyma of the frontal parietal lobe on the left. Some extension into the corpus callosum is noted. Mild midline shift of approximately 4 mm from left to right is noted. This is relatively stable from the previous exam. Surrounding edema is noted. No new focal areas of acute hemorrhage, acute infarction or space-occupying mass lesion are noted. The degree of residual blood within the occipital horns of the lateral ventricles has decreased.  IMPRESSION: Persistent but somewhat resolving hemorrhage in the left frontoparietal region. No new focal abnormality is seen.   Electronically Signed   By: Inez Catalina M.D.   On: 06/02/2015 10:22   Dg Chest Port 1 View  06/02/2015   CLINICAL DATA:  Sepsis  EXAM: PORTABLE CHEST - 1 VIEW  COMPARISON:  05/21/2015  FINDINGS: The heart size and mediastinal contours are within normal limits. Both lungs are clear. The visualized skeletal structures are unremarkable.  IMPRESSION: No active disease.   Electronically Signed   By: Inez Catalina M.D.   On: 06/02/2015 07:02    Medications:  I have reviewed the patient's current medications. Scheduled: . docusate sodium  100 mg Oral Daily  . levETIRAcetam  1,500 mg Oral BID  . QUEtiapine  25 mg Oral QHS  . sulfamethoxazole-trimethoprim  1 tablet Oral Q12H    Assessment/Plan: Patient without further seizure activity. Appears to be back to rehab baseline.  Head CT independently reviewed and shows resolving hemorrhage.    Recommendations: 1.  Continue Keppra at current dose. 2.  Have been using Ativan and Seroquel for sleep.  May want to consider just using Ativan.   3.  Patient may return to therapy    LOS: 1 day   Alexis Goodell, MD [facility] [phone] 06/03/2015  11:24 AM

## 2015-06-03 NOTE — Progress Notes (Signed)
Utilization Review Completed.Tracker Mance T8/21/2016  

## 2015-06-03 NOTE — Progress Notes (Addendum)
Pt to [place] to 5C-13, VSS, family present & aware of [place]. Called report.

## 2015-06-04 ENCOUNTER — Inpatient Hospital Stay (HOSPITAL_COMMUNITY): Payer: BLUE CROSS/BLUE SHIELD

## 2015-06-04 ENCOUNTER — Inpatient Hospital Stay (HOSPITAL_COMMUNITY)
Admission: RE | Admit: 2015-06-04 | Discharge: 2015-06-27 | DRG: 776 | Disposition: A | Discharge status: Home / Self Care | Payer: BLUE CROSS/BLUE SHIELD | Source: Intra-hospital | Attending: Physical Medicine & Rehabilitation | Admitting: Physical Medicine & Rehabilitation

## 2015-06-04 ENCOUNTER — Inpatient Hospital Stay (HOSPITAL_COMMUNITY): Payer: BLUE CROSS/BLUE SHIELD | Admitting: Physical Therapy

## 2015-06-04 ENCOUNTER — Inpatient Hospital Stay (HOSPITAL_COMMUNITY): Payer: BLUE CROSS/BLUE SHIELD | Admitting: Speech Pathology

## 2015-06-04 DIAGNOSIS — F419 Anxiety disorder, unspecified: Secondary | ICD-10-CM

## 2015-06-04 DIAGNOSIS — I959 Hypotension, unspecified: Secondary | ICD-10-CM

## 2015-06-04 DIAGNOSIS — I612 Nontraumatic intracerebral hemorrhage in hemisphere, unspecified: Secondary | ICD-10-CM | POA: Insufficient documentation

## 2015-06-04 DIAGNOSIS — O9089 Other complications of the puerperium, not elsewhere classified: Secondary | ICD-10-CM | POA: Diagnosis present

## 2015-06-04 DIAGNOSIS — I69120 Aphasia following nontraumatic intracerebral hemorrhage: Secondary | ICD-10-CM | POA: Diagnosis not present

## 2015-06-04 DIAGNOSIS — I10 Essential (primary) hypertension: Secondary | ICD-10-CM

## 2015-06-04 DIAGNOSIS — M24573 Contracture, unspecified ankle: Secondary | ICD-10-CM | POA: Diagnosis not present

## 2015-06-04 DIAGNOSIS — I69398 Other sequelae of cerebral infarction: Secondary | ICD-10-CM

## 2015-06-04 DIAGNOSIS — B961 Klebsiella pneumoniae [K. pneumoniae] as the cause of diseases classified elsewhere: Secondary | ICD-10-CM | POA: Diagnosis not present

## 2015-06-04 DIAGNOSIS — R252 Cramp and spasm: Secondary | ICD-10-CM | POA: Diagnosis not present

## 2015-06-04 DIAGNOSIS — Z79899 Other long term (current) drug therapy: Secondary | ICD-10-CM

## 2015-06-04 DIAGNOSIS — N390 Urinary tract infection, site not specified: Secondary | ICD-10-CM | POA: Diagnosis not present

## 2015-06-04 DIAGNOSIS — R569 Unspecified convulsions: Secondary | ICD-10-CM | POA: Diagnosis not present

## 2015-06-04 DIAGNOSIS — G40909 Epilepsy, unspecified, not intractable, without status epilepticus: Secondary | ICD-10-CM | POA: Diagnosis present

## 2015-06-04 DIAGNOSIS — G8190 Hemiplegia, unspecified affecting unspecified side: Secondary | ICD-10-CM | POA: Diagnosis not present

## 2015-06-04 DIAGNOSIS — F4321 Adjustment disorder with depressed mood: Secondary | ICD-10-CM | POA: Diagnosis not present

## 2015-06-04 DIAGNOSIS — G4700 Insomnia, unspecified: Secondary | ICD-10-CM

## 2015-06-04 DIAGNOSIS — I611 Nontraumatic intracerebral hemorrhage in hemisphere, cortical: Secondary | ICD-10-CM

## 2015-06-04 DIAGNOSIS — R1310 Dysphagia, unspecified: Secondary | ICD-10-CM

## 2015-06-04 DIAGNOSIS — S06350S Traumatic hemorrhage of left cerebrum without loss of consciousness, sequela: Secondary | ICD-10-CM | POA: Diagnosis not present

## 2015-06-04 DIAGNOSIS — Z09 Encounter for follow-up examination after completed treatment for conditions other than malignant neoplasm: Secondary | ICD-10-CM

## 2015-06-04 DIAGNOSIS — I69220 Aphasia following other nontraumatic intracranial hemorrhage: Secondary | ICD-10-CM

## 2015-06-04 DIAGNOSIS — M62471 Contracture of muscle, right ankle and foot: Secondary | ICD-10-CM | POA: Diagnosis not present

## 2015-06-04 DIAGNOSIS — I69254 Hemiplegia and hemiparesis following other nontraumatic intracranial hemorrhage affecting left non-dominant side: Secondary | ICD-10-CM

## 2015-06-04 DIAGNOSIS — I69151 Hemiplegia and hemiparesis following nontraumatic intracerebral hemorrhage affecting right dominant side: Secondary | ICD-10-CM | POA: Diagnosis not present

## 2015-06-04 DIAGNOSIS — M62479 Contracture of muscle, unspecified ankle and foot: Secondary | ICD-10-CM | POA: Diagnosis not present

## 2015-06-04 DIAGNOSIS — I69291 Dysphagia following other nontraumatic intracranial hemorrhage: Secondary | ICD-10-CM | POA: Diagnosis not present

## 2015-06-04 HISTORY — DX: Nontraumatic intracerebral hemorrhage in hemisphere, unspecified: I61.2

## 2015-06-04 LAB — URINE CULTURE: Culture: NO GROWTH

## 2015-06-04 MED ORDER — KETOROLAC TROMETHAMINE 30 MG/ML IJ SOLN
30.0000 mg | Freq: Once | INTRAMUSCULAR | Status: AC
  Administered 2015-06-04: 30 mg via INTRAVENOUS
  Filled 2015-06-04: qty 1

## 2015-06-04 MED ORDER — DOCUSATE SODIUM 100 MG PO CAPS
100.0000 mg | ORAL_CAPSULE | Freq: Every day | ORAL | Status: DC
  Administered 2015-06-05 – 2015-06-14 (×10): 100 mg via ORAL
  Filled 2015-06-04 (×12): qty 1

## 2015-06-04 MED ORDER — SULFAMETHOXAZOLE-TRIMETHOPRIM 800-160 MG PO TABS
1.0000 | ORAL_TABLET | Freq: Two times a day (BID) | ORAL | Status: DC
  Administered 2015-06-04 – 2015-06-11 (×14): 1 via ORAL
  Filled 2015-06-04 (×17): qty 1

## 2015-06-04 MED ORDER — LEVETIRACETAM 750 MG PO TABS
1500.0000 mg | ORAL_TABLET | Freq: Two times a day (BID) | ORAL | Status: DC
  Administered 2015-06-04 – 2015-06-27 (×46): 1500 mg via ORAL
  Filled 2015-06-04 (×48): qty 2

## 2015-06-04 MED ORDER — BISACODYL 10 MG RE SUPP
10.0000 mg | Freq: Every day | RECTAL | Status: DC | PRN
  Filled 2015-06-04: qty 1

## 2015-06-04 MED ORDER — ONDANSETRON HCL 4 MG/2ML IJ SOLN: 4.0000 mg | Freq: Four times a day (QID) | INTRAMUSCULAR | Status: DC | PRN

## 2015-06-04 MED ORDER — SENNOSIDES-DOCUSATE SODIUM 8.6-50 MG PO TABS
1.0000 | ORAL_TABLET | Freq: Every evening | ORAL | Status: DC | PRN
  Administered 2015-06-05 – 2015-06-14 (×2): 1 via ORAL
  Filled 2015-06-04 (×4): qty 1

## 2015-06-04 MED ORDER — ENOXAPARIN SODIUM 40 MG/0.4ML ~~LOC~~ SOLN
40.0000 mg | SUBCUTANEOUS | Status: DC
  Administered 2015-06-04 – 2015-06-26 (×23): 40 mg via SUBCUTANEOUS
  Filled 2015-06-04 (×24): qty 0.4

## 2015-06-04 MED ORDER — ONDANSETRON HCL 4 MG PO TABS: 4.0000 mg | ORAL_TABLET | Freq: Four times a day (QID) | ORAL | Status: DC | PRN

## 2015-06-04 MED ORDER — ACETAMINOPHEN 325 MG PO TABS
325.0000 mg | ORAL_TABLET | ORAL | Status: DC | PRN
  Administered 2015-06-04 – 2015-06-08 (×15): 650 mg via ORAL
  Administered 2015-06-08: 325 mg via ORAL
  Administered 2015-06-08 – 2015-06-26 (×27): 650 mg via ORAL
  Filled 2015-06-04 (×46): qty 2

## 2015-06-04 MED ORDER — BOOST / RESOURCE BREEZE PO LIQD
1.0000 | Freq: Three times a day (TID) | ORAL | Status: DC
  Administered 2015-06-04 – 2015-06-26 (×25): 1 via ORAL

## 2015-06-04 MED ORDER — TRAZODONE HCL 50 MG PO TABS
50.0000 mg | ORAL_TABLET | Freq: Every evening | ORAL | Status: DC | PRN
  Administered 2015-06-04: 50 mg via ORAL
  Filled 2015-06-04: qty 1

## 2015-06-04 MED ORDER — LEVETIRACETAM 750 MG PO TABS: 1500.0000 mg | ORAL_TABLET | Freq: Two times a day (BID) | ORAL | Status: DC

## 2015-06-04 MED ORDER — DIPHENHYDRAMINE HCL 12.5 MG/5ML PO ELIX: 12.5000 mg | ORAL_SOLUTION | Freq: Four times a day (QID) | ORAL | Status: DC | PRN

## 2015-06-04 MED ORDER — METOCLOPRAMIDE HCL 5 MG/ML IJ SOLN
10.0000 mg | Freq: Once | INTRAMUSCULAR | Status: AC
  Administered 2015-06-04: 10 mg via INTRAVENOUS
  Filled 2015-06-04: qty 2

## 2015-06-04 MED ORDER — DIPHENHYDRAMINE HCL 50 MG/ML IJ SOLN
25.0000 mg | Freq: Once | INTRAMUSCULAR | Status: AC
Start: 2015-06-04 — End: 2015-06-04
  Administered 2015-06-04: 25 mg via INTRAVENOUS
  Filled 2015-06-04: qty 1

## 2015-06-04 MED ORDER — TRAZODONE HCL 50 MG PO TABS: 50.0000 mg | ORAL_TABLET | Freq: Every evening | ORAL | Status: DC | PRN

## 2015-06-04 MED ORDER — FLEET ENEMA 7-19 GM/118ML RE ENEM: 1.0000 | ENEMA | Freq: Once | RECTAL | Status: DC | PRN

## 2015-06-04 MED ORDER — ACETAMINOPHEN 325 MG PO TABS: 650.0000 mg | ORAL_TABLET | Freq: Four times a day (QID) | ORAL | Status: DC | PRN

## 2015-06-04 MED ORDER — ALUM & MAG HYDROXIDE-SIMETH 200-200-20 MG/5ML PO SUSP: 30.0000 mL | ORAL | Status: DC | PRN

## 2015-06-04 MED ORDER — GUAIFENESIN-DM 100-10 MG/5ML PO SYRP: 5.0000 mL | ORAL_SOLUTION | Freq: Four times a day (QID) | ORAL | Status: DC | PRN

## 2015-06-04 MED ORDER — SULFAMETHOXAZOLE-TRIMETHOPRIM 800-160 MG PO TABS: 1.0000 | ORAL_TABLET | Freq: Two times a day (BID) | ORAL | Status: DC

## 2015-06-04 MED ORDER — OXYCODONE HCL 5 MG PO TABS
5.0000 mg | ORAL_TABLET | Freq: Four times a day (QID) | ORAL | Status: DC | PRN
  Administered 2015-06-05 (×2): 5 mg via ORAL
  Filled 2015-06-04 (×2): qty 1

## 2015-06-04 NOTE — Progress Notes (Signed)
Rehab admissions - I have approval for acute inpatient rehab admission from [facility].  Bed available and will admit today.  Call me for questions.  #[phone]

## 2015-06-04 NOTE — H&P (Signed)
Physical Medicine and Rehabilitation Admission H&P   CC: Right sided weakness, difficulty speaking and seizures.    HPI: Ashley [name] is a 39 y.o. Postpartum female admitted on 05/20/15 with complaints of HA since delivery She was admitted on 05/21/15 with right facial droop with aphasia, right sided weakness and seizures. She was found to have acute large left fronto-parietal IPH/SAH and was started on Keppra for treatment. She developed vasogenic edema and was treated with hypertonic saline and fallow up CCT 08/12 showed stability. Work up without definite cerebral venous sinus/cortical vein thrombosis and no anticoagulation needed. Patient with resultant dense left hemiplegia, aphasia as well as dysphagia and was admitted to [facility] on 05/28/15 for rehab. She has had issues with sleep wake disruption as well as anxiety which has improved with addition of Seroquel. Foley was discontinued on 08/16 and she was started on Keflex for Klebsiella UTI. With increase in activity level, she has had worsening of RLE spasms and Zanaflex was added and titrated to bid to help with symptoms. Blood pressures were controlled and po intake had improved. On 06/02/15 am, patient developed seizures with obtundation. She was noted to be severely hypotensive and was treated with fluid bolus as well as IV Keppra and IV antibiotics due to concerns of sepsis. She was transferred to stepdown unit for closer monitoring.   Follow up CT head showed resolving left frontoparietal hemorrhage. Keppra was increased to 1500 mg bid and patient was back to baseline later than am. Dr. Doy Mince recommended treating infection and recommended use of ativan alone for treatment of anxiety/insomnia. Patient has been seizure free and has had improvement in use of RUE as well as verbal output. Therapy was re-initiated this am and patient re-admitted back to complete her rehab course.    Review of Systems  HENT: Negative for hearing  loss and tinnitus.  Eyes: Negative for blurred vision and double vision.  Respiratory: Negative for cough, shortness of breath and wheezing.  Cardiovascular: Negative for chest pain and palpitations.  Gastrointestinal: Negative for heartburn, nausea, abdominal pain and constipation.  Genitourinary: Negative for dysuria, urgency and frequency.  Musculoskeletal: Negative for myalgias, back pain and neck pain.  Neurological: Positive for dizziness, sensory change, speech change, focal weakness and headaches. Negative for tingling.  Psychiatric/Behavioral: The patient is nervous/anxious and has insomnia.      Past Medical History  Diagnosis Date  . Anxiety   . ICH (intracerebral hemorrhage)     Past Surgical History  Procedure Laterality Date  . Dilation and curettage of uterus     Family History  Problem Relation Age of Onset  . Cancer Mother   . Cancer Father     Social History: Married. Has 5 week old and 2 step children at home. Per reports working and active PTA. reports that she has never smoked. She has never used smokeless tobacco. She reports that she drinks alcohol. She reports that she does not use illicit drugs.    Allergies: No Known Allergies    Medications Prior to Admission  Medication Sig Dispense Refill  . acetaminophen (TYLENOL) 500 MG tablet Take 1,000 mg by mouth every 6 (six) hours as needed for headache.    Marland Kitchen acyclovir (ZOVIRAX) 400 MG tablet Take 400 mg by mouth 5 (five) times daily as needed (for breakout).     Marland Kitchen ampicillin (PRINCIPEN) 500 MG capsule Take 500 mg by mouth 2 (two) times daily as needed.  5  . aspirin-acetaminophen-caffeine ([place]) 250-250-65 MG  per tablet Take 1-2 tablets by mouth every 6 (six) hours as needed for headache or migraine.    . docusate sodium (COLACE) 100 MG capsule Take 100 mg by mouth daily.    Marland Kitchen FLUoxetine (PROZAC) 40 MG capsule Take 40 mg by  mouth daily.    Marland Kitchen ibuprofen (ADVIL,MOTRIN) 600 MG tablet Take 1 tablet (600 mg total) by mouth every 6 (six) hours as needed for moderate pain or cramping. 40 tablet 0  . Prenatal Vit-Fe Fumarate-FA (PRENATAL MULTIVITAMIN) TABS tablet Take 1 tablet by mouth daily at 12 noon.      Home: Home Living Family/patient expects to be discharged to:: Private residence Living Arrangements: Spouse/significant other, Children, Parent Available Help at Discharge: Family, Available 24 hours/day Type of Home: House Home Access: Stairs to enter [employer] of Steps: 3-4 in front and 2 via garage Entrance Stairs-Rails: None Home Layout: Two level, Bed/bath upstairs Alternate Level Stairs-Number of Steps: full flight  Alternate Level Stairs-Rails: Right, Left Bathroom Shower/Tub: Tub/shower unit, Curtain, Other (comment) Bathroom Toilet: Standard Bathroom Accessibility: Yes Home Equipment: None Additional Comments: lives with spouse; 2wk yo son; shares custody of 2 children 28yo, 63yo; Parents and inlaws retired & can assist; Also sister n Sports coach.  Lives With: Spouse  Functional History: Prior Function Level of Independence: Independent Comments: Medical sales; running  Functional Status:  Mobility: Bed Mobility Overal bed mobility: Needs Assistance Bed Mobility: Rolling, Sidelying to Sit Rolling: Supervision (to the right) Sidelying to sit: Mod assist Transfers Overall transfer level: Needs assistance Equipment used: Standard walker Transfers: Sit to/from Stand, Stand Pivot Transfers Sit to Stand: Min assist Stand pivot transfers: Mod assist      ADL: ADL Overall ADL's : Needs assistance/impaired Eating/Feeding: Set up, Supervision/ safety, Sitting Grooming: Brushing hair, Wash/dry face, Moderate assistance, Sitting Grooming Details (indicate cue type and reason): Pt knew what to do with comb, but having difficulty due to having to use left hand and pt with  multiple tangles Upper Body Bathing: Moderate assistance Upper Body Bathing Details (indicate cue type and reason): Due to cues needed to sequence for throughness Lower Body Bathing: Moderate assistance Lower Body Bathing Details (indicate cue type and reason): with min A sit<>stand, Mod A to maintain standing while washing peri area. A to hold RLE crossed over LLE Upper Body Dressing : Maximal assistance, Sitting Lower Body Dressing: Maximal assistance (with min A sit<>stand and Mod A to maintain balance with dynamic task) Toilet Transfer: Moderate assistance, Stand-pivot, BSC Toileting- Clothing Manipulation and Hygiene: Moderate assistance (with min A sit<>stand and Mod A to maintain standing for peri-hygiene)  Cognition: Cognition Overall Cognitive Status: Impaired/Different from baseline Orientation Level: Oriented to person, Other (comment) (expressive aphasia ) Cognition Arousal/Alertness: Lethargic Behavior During Therapy: Flat affect Overall Cognitive Status: Impaired/Different from baseline Area of Impairment: Attention, Safety/judgement, Awareness, Problem solving Current Attention Level: Sustained Following Commands: Follows one step commands consistently Safety/Judgement: Decreased awareness of safety Awareness: Intellectual Problem Solving: Requires verbal cues   Blood pressure 94/59, pulse 84, temperature 98.9 F (37.2 C), temperature source Oral, resp. rate 20, height _0  (1.6 m), weight 64.5 kg (142 lb 3.2 oz), SpO2 98 %, unknown if currently breastfeeding. Physical Exam  Nursing note and vitals reviewed. Constitutional: She appears well-developed and well-nourished.  HENT:  Head: Normocephalic and atraumatic.  Mouth/Throat: Oropharynx is clear and moist.  Eyes: Conjunctivae are normal. Pupils are equal, round, and reactive to light.  Neck: Normal range of motion. Neck supple.  Healed abrasions left  neck.  Cardiovascular: Normal rate and regular rhythm.    Respiratory: Effort normal and breath sounds normal. No respiratory distress. She has no wheezes.  GI: Soft. Bowel sounds are normal. She exhibits no distension. There is no tenderness.  Musculoskeletal: She exhibits no edema or tenderness.  Neurological: She is alert.  Expressive > receptive aphasia. Was able to state place as hospital but needed cues for "[facility]". She was able to follow simple one step questions. Rights hemisensory deficits with hemiparesis. RLE with extensor tone. RUE 1-2/5. RLE 1-2/5 with apraxia. Sensory 1/2--senses general pain and gross touch.  Skin: Skin is warm and dry.  Psychiatric: She has a normal mood and affect. Her behavior is normal.     Lab Results Last 48 Hours    Results for orders placed or performed during the hospital encounter of 06/02/15 (from the past 48 hour(s))  Lactic acid, plasma Status: None   Collection Time: 06/02/15 12:45 PM  Result Value Ref Range   Lactic Acid, Venous 1.4 0.5 - 2.0 mmol/L  Magnesium Status: None   Collection Time: 06/02/15 12:45 PM  Result Value Ref Range   Magnesium 1.8 1.7 - 2.4 mg/dL  Phosphorus Status: None   Collection Time: 06/02/15 12:45 PM  Result Value Ref Range   Phosphorus 4.3 2.5 - 4.6 mg/dL  Basic metabolic panel Status: Abnormal   Collection Time: 06/03/15 3:00 AM  Result Value Ref Range   Sodium 135 135 - 145 mmol/L   Potassium 3.9 3.5 - 5.1 mmol/L   Chloride 98 (L) 101 - 111 mmol/L   CO2 28 22 - 32 mmol/L   Glucose, Bld 94 65 - 99 mg/dL   BUN 7 6 - 20 mg/dL   Creatinine, Ser 0.65 0.44 - 1.00 mg/dL   Calcium 8.6 (L) 8.9 - 10.3 mg/dL   GFR calc non Af Amer >60 >60 mL/min   GFR calc Af Amer >60 >60 mL/min    Comment: (NOTE) The eGFR has been calculated using the CKD EPI equation. This calculation has not been validated in all clinical situations. eGFR's persistently <60 mL/min signify possible  Chronic Kidney Disease.    Anion gap 9 5 - 15  CBC Status: Abnormal   Collection Time: 06/03/15 3:00 AM  Result Value Ref Range   WBC 6.8 4.0 - 10.5 K/uL   RBC 4.26 3.87 - 5.11 MIL/uL   Hemoglobin 12.6 12.0 - 15.0 g/dL   HCT 38.8 36.0 - 46.0 %   MCV 91.1 78.0 - 100.0 fL   MCH 29.6 26.0 - 34.0 pg   MCHC 32.5 30.0 - 36.0 g/dL   RDW 13.7 11.5 - 15.5 %   Platelets 403 (H) 150 - 400 K/uL      Imaging Results (Last 48 hours)    No results found.       Medical Problem List and Plan: 1. Functional deficits secondary to Left fronto-parietal ICH with right spastic hemiparesis, expressive > receptive aphasia,  2. DVT Prophylaxis/Anticoagulation: Pharmaceutical: Lovenox 3. Pain Management: Oxycodone prn severe headaches.  4. Mood: Team to provide ego support. Continue ativan prn for anxiety. Off prozac at this time and will monitor mood for now. LCSW to follow for evaluation and support.  5. Neuropsych: This patient is not capable of making decisions on her own behalf. 6. Skin/Wound Care: Routine pressure relief measures 7. Fluids/Electrolytes/Nutrition: Monitor I/O. Offer supplements between meals and family encouraged to bring meals from homes.  8. Klebsiella UTI: Continue bactrim for treatment.  9. Seizures:  Continue Keppra 1500 mg bid (increased over weekend) 10. HTN: Monitor BP bid and treat as indicated.     Post Admission Physician Evaluation:   Interrupted stay. Please see initial [place], MD, [facility] 06/04/2015

## 2015-06-04 NOTE — Progress Notes (Signed)
0020hrs pt C/O headache and light sensitivity that feels different than prior incidents related to current condition. States pain started earlier and she went to sleep then woke up with progressively worsening pain in anterior head and face area rated 7/10. She could not provide further description or explanation of how this was different than before other than she just felt it was different and was having some anxiety, vitals were within normal ranges, no changes to pt neurologically. Notified on call, orders received and implemented. At time of note Pt resting with eyes closed, no obvious distress, will monitor and chart changes.

## 2015-06-04 NOTE — Evaluation (Signed)
Occupational Therapy Evaluation Patient Details Name: Ashley [name] MRN: [id number] DOB: 1976/02/17 Today's Date: 06/04/2015    History of Present Illness Ashley [name] is an 39 y.o. female who is two weeks post-partum who developed a headache; CT of the brain shows L parietal ICH w/ SAH and midline shift. Admitted to CIR on 05/28/15, had seizure with low BP on 8/20 and transferred back to acute care.   Clinical Impression   This 39 yo female admitted with above presents to acute care with inattention to right side, decreased balance, decreased mobility, flaccid RUE all affecting her ability to care for herself and her 3 kids at an independent level as she was pta. She will benefit from acute OT with follow up OT on CIR to get back to a S level or better to return home with family.    Follow Up Recommendations  CIR;Supervision/Assistance - 24 hour    Equipment Recommendations  3 in 1 bedside comode;Tub/shower bench    Recommendations for Other Services Rehab consult     Precautions / Restrictions Precautions Precautions: Fall Precaution Comments: Flaccid with noted mild intermittent flexor tone at elbow Restrictions Weight Bearing Restrictions: No      Mobility Bed Mobility Overal bed mobility: Needs Assistance Bed Mobility: Rolling;Sidelying to Sit Rolling: Supervision (to the right) Sidelying to sit: Mod assist          Transfers Overall transfer level: Needs assistance Equipment used: Standard walker Transfers: Sit to/from Stand;Stand Pivot Transfers Sit to Stand: Min assist Stand pivot transfers: Mod assist            Balance Overall balance assessment: Needs assistance Sitting-balance support: Feet supported;Single extremity supported Sitting balance-Leahy Scale: Fair     Standing balance support: Single extremity supported;During functional activity Standing balance-Leahy Scale: Poor                              ADL Overall ADL's :  Needs assistance/impaired Eating/Feeding: Set up;Supervision/ safety;Sitting   Grooming: Brushing hair;Wash/dry face;Moderate assistance;Sitting Grooming Details (indicate cue type and reason): Pt knew what to do with comb, but having difficulty due to having to use left hand and pt with multiple tangles Upper Body Bathing: Moderate assistance Upper Body Bathing Details (indicate cue type and reason): Due to cues needed to sequence for throughness Lower Body Bathing: Moderate assistance Lower Body Bathing Details (indicate cue type and reason): with min A sit<>stand, Mod A to maintain standing while washing peri area. A to hold RLE crossed over LLE Upper Body Dressing : Maximal assistance;Sitting   Lower Body Dressing: Maximal assistance (with min A sit<>stand and Mod A to maintain balance with dynamic task)   Toilet Transfer: Moderate assistance;Stand-pivot;BSC   Toileting- Clothing Manipulation and Hygiene: Moderate assistance (with min A sit<>stand and Mod A to maintain standing for peri-hygiene)                  Perception Perception Perception Tested?: Yes Perception Deficits: Inattention/neglect (right side) Inattention/Neglect: Does not attend to right side of body       Pertinent Vitals/Pain Pain Assessment: No/denies pain     Hand Dominance Right   Extremity/Trunk Assessment Upper Extremity Assessment Upper Extremity Assessment: RUE deficits/detail RUE Deficits / Details: Pt demonstrates full PROM, flaccid other than intermittent mild flexion tone at elbow RUE Coordination: decreased gross motor;decreased fine motor   Lower Extremity Assessment Lower Extremity Assessment: Defer to PT evaluation  Communication Communication Communication: Receptive difficulties;Expressive difficulties   Cognition Arousal/Alertness: Lethargic Behavior During Therapy: Flat affect Overall Cognitive Status: Impaired/Different from baseline Area of Impairment:  Attention;Safety/judgement;Awareness;Problem solving   Current Attention Level: Sustained   Following Commands: Follows one step commands consistently Safety/Judgement: Decreased awareness of safety Awareness: Intellectual Problem Solving: Requires verbal cues                Home Living Family/patient expects to be discharged to:: Private residence Living Arrangements: Spouse/significant other;Children;Parent Available Help at Discharge: Family;Available 24 hours/day Type of Home: House Home Access: Stairs to enter [employer] of Steps: 3-4 in front and 2 via garage Entrance Stairs-Rails: None Home Layout: Two level;Bed/bath upstairs Alternate Level Stairs-Number of Steps: full flight  Alternate Level Stairs-Rails: Right;Left Bathroom Shower/Tub: Tub/shower unit;Curtain;Other (comment)   Bathroom Toilet: Standard Bathroom Accessibility: Yes How Accessible: Accessible via walker Home Equipment: None   Additional Comments: lives with spouse; 2wk yo son; shares custody of 2 children 71yo, 61yo; Parents and inlaws retired & can assist; Also sister n Sports coach.    Lives With: Spouse    Prior Functioning/Environment Level of Independence: Independent        Comments: Medical sales; running    OT Diagnosis: Generalized weakness;Cognitive deficits;Disturbance of vision;Hemiplegia dominant side   OT Problem List: Decreased strength;Impaired balance (sitting and/or standing);Decreased safety awareness;Decreased cognition;Impaired UE functional use;Decreased coordination;Impaired vision/perception;Decreased knowledge of use of DME or AE;Impaired tone   OT Treatment/Interventions: Self-care/ADL training;Neuromuscular education;DME and/or AE instruction;Therapeutic activities;Cognitive remediation/compensation;Visual/perceptual remediation/compensation;Patient/family education;Balance training    OT Goals(Current goals can be found in the care plan section) Acute Rehab OT  Goals Patient Stated Goal: to get back to rehab then home OT Goal Formulation: With patient/family Time For Goal Achievement: 06/11/15 Potential to Achieve Goals: Good  OT Frequency: Min 3X/week              End of Session Nurse Communication: Mobility status (Mod A going to pt's left)  Activity Tolerance: Patient tolerated treatment well Patient left: in chair;with call bell/phone within reach;with chair alarm set;with family/visitor present   Time: 4580-9983 OT Time Calculation (min): 40 min Charges:  OT General Charges $OT Visit: 1 Procedure OT Evaluation $Initial OT Evaluation Tier I: 1 Procedure OT Treatments $Self Care/Home Management : 23-37 mins  Almon Register [phone] 06/04/2015, 10:03 AM

## 2015-06-04 NOTE — Progress Notes (Signed)
Rehab admissions - Patient known to me from recent inpatient rehab stay.  I would like to re admit to acute inpatient rehab today if possible.  I will need updated PT and OT notes to submit to [employer] to seek approval for acute inpatient rehab admission.  Patient left rehab on Saturday with a seizure.  Husband would like patient to return to rehab but we will need re authorization.  Please call me for questions.  #[phone]

## 2015-06-04 NOTE — PMR Pre-admission (Signed)
PMR Admission Coordinator Pre-Admission Assessment  Patient: Ashley [name] is an 39 y.o., female MRN: [id number] DOB: 09-15-1976 Height: 5\' 3"  (160 cm) Weight: 64.5 kg (142 lb 3.2 oz)              Insurance Information HMO:      PPO:       PCP:       IPA:       80/20:       OTHER:  [employer] PRIMARY: [employer]BS      Policy#: [id number]      Subscriber: self CM Name: Harrie Jeans      Phone#:[phone]     Fax#: [phone] Pre-Cert#: [id number]      Employer: [employer] Benefits:  Phone #: [phone]     Name: On line Eff. Date: 10/13/14     Deduct: $3500      Out of Pocket Max: [phone]      Life Max: None CIR: 70% with auth      SNF: 70% w/auth Outpatient: 30 visits max     Co-Pay:$25 copay Home Health: 70%      Co-Pay: 30% DME: 70%     Co-Pay: 30% Providers: in network  Medicaid Application Date:        Case Manager:   Disability Application Date:        Case Worker:    Emergency Facilities manager Information    Name Relation Home Work [place] Spouse [phone]  [phone]   Collins,Teresq Mother   [phone]     Current Medical History  Patient Admitting Diagnosis:  L parietal ICH, seizure  History of Present Illness: A 39 y.o. Postpartum female admitted on 05/20/15 with complaints of HA since delivery She was admitted on 05/21/15 with right facial droop with aphasia, right sided weakness and seizures. She was found to have acute large left fronto-parietal IPH/SAH and was started on Keppra for treatment. She developed vasogenic edema and was treated with hypertonic saline and follow up CCT 08/12 showed stability. Work up without definite cerebral venous sinus/cortical vein thrombosis and no anticoagulation needed. Patient with resultant dense left hemiplegia, aphasia as well as dysphagia and was admitted to CIR on 05/28/15 for rehab. She has had issues with sleep wake disruption as well as anxiety which has improved with addition of  Seroquel. Foley was discontinued on 08/16 and she was started on Keflex for Klebsiella UTI. With increase in activity level, she has had worsening of RLE spasms and Zanaflex was added and titrated to bid to help with symptoms. Blood pressures were controlled and po intake had improved. On 06/02/15 am, patient developed seizures with obtundation. She was noted to be severely hypotensive and was treated with fluid bolus as well as IV Keppra and IV antibiotics due to concerns of sepsis. She was transferred to stepdown unit for closer monitoring.   Total: 9=NIH  Past Medical History  Past Medical History  Diagnosis Date  . Anxiety   . ICH (intracerebral hemorrhage)     Family History  family history includes Cancer in her father and mother.  Prior Rehab/Hospitalizations: Recent admission to CIR 05/26/15 to 06/02/15.  Transferred back to acute hospital 06/02/15 after seizure.  Has the patient had major surgery during 100 days prior to admission? No.  Delivery of baby4 weeks ago on 05/07/15.  Current Medications   Current facility-administered medications:  .  acetaminophen (TYLENOL) tablet 650 mg, 650 mg, Oral, Q6H PRN, Jessica U Vann, DO, 650 mg at  06/04/15 1302 .  docusate sodium (COLACE) capsule 100 mg, 100 mg, Oral, Daily, Jessica U Vann, DO, 100 mg at 06/04/15 1004 .  levETIRAcetam (KEPPRA) tablet 1,500 mg, 1,500 mg, Oral, BID, Alexis Goodell, MD, 1,500 mg at 06/04/15 1003 .  ondansetron (ZOFRAN) tablet 4 mg, 4 mg, Oral, Q6H PRN **OR** ondansetron (ZOFRAN) injection 4 mg, 4 mg, Intravenous, Q6H PRN, Theressa Millard, MD .  oxyCODONE (Oxy IR/ROXICODONE) immediate release tablet 5 mg, 5 mg, Oral, Q4H PRN, Theressa Millard, MD, 5 mg at 06/03/15 1627 .  sulfamethoxazole-trimethoprim (BACTRIM DS,SEPTRA DS) 800-160 MG per tablet 1 tablet, 1 tablet, Oral, Q12H, Delfina Redwood, MD, 1 tablet at 06/04/15 1003 .  traZODone (DESYREL) tablet 50 mg, 50 mg, Oral, QHS PRN, Geradine Girt, DO,  50 mg at 06/03/15 0129  Patients Current Diet: Diet regular Room service appropriate?: Yes; Fluid consistency:: Thin  Precautions / Restrictions Precautions Precautions: Fall Precaution Comments: Flaccid with noted mild intermittent flexor tone at elbow Restrictions Weight Bearing Restrictions: No   Has the patient had 2 or more falls or a fall with injury in the past year?No  Prior Activity Level Community (5-7x/wk): Very active PTA.  Ran on the treadmill 4 miles a day.  Went to the pool with two older children.  Has a newborn son 41 weeks old.  Home Assistive Devices / Equipment Home Assistive Devices/Equipment: None Home Equipment: None  Prior Device Use: Indicate devices/aids used by the patient prior to current illness, exacerbation or injury? None  Prior Functional Level Prior Function Level of Independence: Independent Comments: Medical sales; running  Self Care: Did the patient need help bathing, dressing, using the toilet or eating?  Independent  Indoor Mobility: Did the patient need assistance with walking from room to room (with or without device)? Independent  Stairs: Did the patient need assistance with internal or external stairs (with or without device)? Independent  Functional Cognition: Did the patient need help planning regular tasks such as shopping or remembering to take medications? Independent  Current Functional Level Cognition  Overall Cognitive Status: Impaired/Different from baseline Current Attention Level: Sustained Orientation Level: Oriented to person, Other (comment) (expressive aphasia ) Following Commands: Follows one step commands consistently, Follows one step commands with increased time Safety/Judgement: Decreased awareness of safety    Extremity Assessment (includes Sensation/Coordination)  Upper Extremity Assessment: Defer to OT evaluation RUE Deficits / Details: Pt demonstrates full PROM, flaccid other than intermittent mild  flexion tone at elbow RUE Coordination: decreased gross motor, decreased fine motor  Lower Extremity Assessment: RLE deficits/detail RLE Deficits / Details: MIld tone noted throughout RLE. No voluntary movement throughout LLE ~0/5.     ADLs  Overall ADL's : Needs assistance/impaired Eating/Feeding: Set up, Supervision/ safety, Sitting Grooming: Brushing hair, Wash/dry face, Moderate assistance, Sitting Grooming Details (indicate cue type and reason): Pt knew what to do with comb, but having difficulty due to having to use left hand and pt with multiple tangles Upper Body Bathing: Moderate assistance Upper Body Bathing Details (indicate cue type and reason): Due to cues needed to sequence for throughness Lower Body Bathing: Moderate assistance Lower Body Bathing Details (indicate cue type and reason): with min A sit<>stand, Mod A to maintain standing while washing peri area. A to hold RLE crossed over LLE Upper Body Dressing : Maximal assistance, Sitting Lower Body Dressing: Maximal assistance (with min A sit<>stand and Mod A to maintain balance with dynamic task) Toilet Transfer: Moderate assistance, Stand-pivot, BSC Toileting- Clothing Manipulation and  Hygiene: Moderate assistance (with min A sit<>stand and Mod A to maintain standing for peri-hygiene)    Mobility  Overal bed mobility: Needs Assistance Bed Mobility: Supine to Sit Rolling: Supervision (to the right) Sidelying to sit: Mod assist Supine to sit: Mod assist, HOB elevated General bed mobility comments: Assist with RLE and cues to reciprocally scoot bottom to EOB.     Transfers  Overall transfer level: Needs assistance Equipment used: Rolling walker (2 wheeled) Transfers: Sit to/from Stand Sit to Stand: Mod assist Stand pivot transfers: Mod assist, +2 physical assistance, Min assist General transfer comment: Mod A progressing to Min A to stand from EOB x1, from chair x1, from [facility] x1. Cues for weight bearing through RUE and  anterior weight shift/translation to power up. Manual cues for upright posture. SPT bed to [facility]; BSC to chair with pivotal steps and cues.     Ambulation / Gait / Stairs / Wheelchair Mobility  Ambulation/Gait Ambulation/Gait assistance: Max assist, +2 physical assistance Ambulation Distance (Feet): 4 Feet Assistive device: Rolling walker (2 wheeled) Gait Pattern/deviations: Step-to pattern, Decreased stride length, Narrow base of support, Decreased dorsiflexion - right, Decreased weight shift to right General Gait Details: Difficulty advancing LLE (pivoting foot some) even with assist for weight shifting. Therapist facilitating advancement of RLE and blocking right knee during stance phase. Pt with inversion right ankle during stance. Cues for upright posture.    Posture / Balance Dynamic Sitting Balance Sitting balance - Comments: Able to perform reciprocal scooting for getting to EOB and better positioning in chair with Min A for right pelvis. Balance Overall balance assessment: Needs assistance Sitting-balance support: Feet supported, Single extremity supported Sitting balance-Leahy Scale: Fair Sitting balance - Comments: Able to perform reciprocal scooting for getting to EOB and better positioning in chair with Min A for right pelvis. Standing balance support: During functional activity Standing balance-Leahy Scale: Poor Standing balance comment: ABle to stand with Mod A for balance, upright posture and right knee stability.     Ashley needs/care consideration BiPAP/CPAP No CPM No Continuous Drip IV No Dialysis No     Life Vest No Oxygen No Ashley Bed No Trach Size No Wound Vac (area) No    Skin No                            Bowel mgmt: Last BM 06/02/15 with incontinence at times Bladder mgmt: Incontinent at times per documentation Diabetic mgmt No    Previous Home Environment Living Arrangements: Spouse/significant other, Children, Parent  Lives With: Spouse Available  Help at Discharge: Family, Available 24 hours/day Type of Home: House Home Layout: Two level, Bed/bath upstairs Alternate Level Stairs-Rails: Right, Left Alternate Level Stairs-Number of Steps: full flight  Home Access: Stairs to enter Entrance Stairs-Rails: None Entrance Stairs-Number of Steps: 3-4 in front and 2 via garage Bathroom Shower/Tub: Tub/shower unit, Curtain, Other (comment) Bathroom Toilet: Standard Bathroom Accessibility: Yes How Accessible: Accessible via walker Home Care Services: No Additional Comments: lives with spouse; 34 wk old son; shares custody of 2 children 1yo, 86yo; Parents and inlaws retired & can assist; Also sister n Sports coach.    Discharge Living Setting Plans for Discharge Living Setting: Patient's home, House, Lives with (comment) (Lives with husband, mom, and 3 children.) Type of Home at Discharge: House Discharge Home Layout: Two level, Bed/bath upstairs Discharge Home Access: Stairs to enter Entrance Stairs-Rails: None Entrance Stairs-Number of Steps: 1-2 steps garage entry Discharge Bathroom Shower/Tub:  Tub/shower unit Discharge Bathroom Toilet: Standard Discharge Bathroom Accessibility: Yes How Accessible: Accessible via walker Does the patient have any problems obtaining your medications?: No  Social/Family/Support Systems Patient Roles: Spouse, Parent, Other (Comment) (Has a husband, now has 3 children, mom, sister.) Contact Information: [name] Lefevre - husband Anticipated Caregiver: Husband and mother Anticipated Caregiver's Contact Information: [name] - spouse [phone] and Helene Kelp mom [phone] Ability/Limitations of Caregiver: Husband is self employed with 15 employees.  He can take a month of if needed.  Patient's mom has moved into their home and plans to stay indefinitely.  Mom assisting with care of baby.  Patient has joint custody of her other 2 children, ages 84 and 71. Caregiver Availability: 24/7 Discharge Plan Discussed with Primary  Caregiver: Yes Is Caregiver In Agreement with Plan?: Yes Does Caregiver/Family have Issues with Lodging/Transportation while Pt is in Rehab?: No  Goals/Additional Needs Patient/Family Goal for Rehab: PT/OT/ST min assist Expected length of stay: 20-25 days Cultural Considerations: None Dietary Needs: Regular diet, thin liquids Equipment Needs: TBD Ashley Service Needs: Patient is lactating Pt/Family Agrees to Admission and willing to participate: Yes Program Orientation Provided & Reviewed with Pt/Caregiver Including Roles  & Responsibilities: Yes  Decrease burden of Care through IP rehab admission: No  Possible need for SNF placement upon discharge: Not aticipated  Patient Condition: This patient's medical and functional status has changed since the consult dated: 05/23/15 in which the Rehabilitation Physician determined and documented that the patient's condition is appropriate for intensive rehabilitative care in an inpatient rehabilitation facility. See "History of Present Illness" (above) for medical update. Functional changes are: Currently requiring mod/max assist with ADLs and mod assist for transfers. Patient's medical and functional status update has been discussed with the Rehabilitation physician and patient remains appropriate for inpatient rehabilitation. Will admit to inpatient rehab today.  Preadmission Screen Completed By:  Retta Diones, 06/04/2015 1:48 PM ______________________________________________________________________   Discussed status with Dr. Naaman Plummer on 06/04/15 at 1348 and received telephone approval for admission today.  Admission Coordinator:  Retta Diones, time1348/Date08/22/16

## 2015-06-04 NOTE — Progress Notes (Signed)
Rehab admissions - I have reopened the case requesting acute inpatient rehab admission.  I have faxed updated PT and OT notes to insurance case manager.  Awaiting decision from [place].  I will update all once I hear back from [facility].  Call me for questions.  #[phone]

## 2015-06-04 NOTE — Progress Notes (Addendum)
[facility] PROGRESS NOTE  Ashley [name] YIR:[id number] DOB: 1976/09/06 DOA: 06/02/2015 PCP: No PCP Per Patient  Assessment/Plan:  Principal Problem:   Seizure disorder: none further. Now on keppra 1500 bid. Stable for transfer back to CIR Active Problems:   ICH (intracerebral hemorrhage): transfer back to rehab   Hypotension resolved. None further   Sepsis: BC neg to date   UTI (urinary tract infection), Klebsiella. Continue bactrim  D/c seroquel due to somnolence  HPI/Subjective: Sleepy. Still with right sided weakness  Objective: Filed Vitals:   06/04/15 0542  BP: 100/54  Pulse: 75  Temp: 98 F (36.7 C)  Resp: 18    Intake/Output Summary (Last 24 hours) at 06/04/15 0927 Last data filed at 06/03/15 1116  Gross per 24 hour  Intake 773.33 ml  Output      0 ml  Net 773.33 ml   Filed Weights   06/02/15 0700  Weight: 64.5 kg (142 lb 3.2 oz)    Exam:   General:  In chair. Alert. Appropriate. Answers q and follows commands  Cardiovascular: RRR without MGR  Respiratory: CTA without WRR  Abdomen: S, NT, ND  Ext: no CCE  Neuro: right sided weakness  Basic Metabolic Panel:  Recent Labs Lab 05/29/15 0540 06/01/15 0605 06/02/15 0014 06/02/15 0600 06/02/15 1245 06/03/15 0300  NA 137 135  --  135  --  135  K 3.9 4.1  --  4.4  --  3.9  CL 103 98*  --  100*  --  98*  CO2 27 29  --  26  --  28  GLUCOSE 95 99  --  93  --  94  BUN <5* 6  --  9  --  7  CREATININE 0.39* 0.50 0.47 0.56  --  0.65  CALCIUM 8.6* 9.8  --  9.0  --  8.6*  MG  --   --   --   --  1.8  --   PHOS  --   --   --   --  4.3  --    Liver Function Tests:  Recent Labs Lab 06/02/15 0600  AST 30  ALT 46  ALKPHOS 75  BILITOT 0.5  PROT 5.7*  ALBUMIN 3.2*   No results for input(s): LIPASE, AMYLASE in the last 168 hours. No results for input(s): AMMONIA in the last 168 hours. CBC:  Recent Labs Lab 06/02/15 0600 06/02/15 0900 06/03/15 0300  WBC 7.3 7.4 6.8  NEUTROABS 4.6  5.3  --   HGB 13.0 12.8 12.6  HCT 39.9 39.8 38.8  MCV 89.3 90.0 91.1  PLT 416* 422* 403*   Cardiac Enzymes: No results for input(s): CKTOTAL, CKMB, CKMBINDEX, TROPONINI in the last 168 hours. BNP (last 3 results) No results for input(s): BNP in the last 8760 hours.  ProBNP (last 3 results) No results for input(s): PROBNP in the last 8760 hours.  CBG:  Recent Labs Lab 06/02/15 0522  GLUCAP 92    Recent Results (from the past 240 hour(s))  Urine culture     Status: None   Collection Time: 05/30/15  4:28 PM  Result Value Ref Range Status   Specimen Description URINE, RANDOM  Final   Special Requests NONE  Final   Culture >=100,000 COLONIES/mL KLEBSIELLA PNEUMONIAE  Final   Report Status 06/01/2015 FINAL  Final   Organism ID, Bacteria KLEBSIELLA PNEUMONIAE  Final      Susceptibility   Klebsiella pneumoniae - MIC*    AMPICILLIN >=32 RESISTANT  Resistant     CEFAZOLIN <=4 SENSITIVE Sensitive     CEFTRIAXONE <=1 SENSITIVE Sensitive     CIPROFLOXACIN <=0.25 SENSITIVE Sensitive     GENTAMICIN <=1 SENSITIVE Sensitive     IMIPENEM <=0.25 SENSITIVE Sensitive     NITROFURANTOIN 64 INTERMEDIATE Intermediate     TRIMETH/SULFA <=20 SENSITIVE Sensitive     AMPICILLIN/SULBACTAM 4 SENSITIVE Sensitive     PIP/TAZO <=4 SENSITIVE Sensitive     * >=100,000 COLONIES/mL KLEBSIELLA PNEUMONIAE  Culture, blood (x 2)     Status: None (Preliminary result)   Collection Time: 06/02/15  9:00 AM  Result Value Ref Range Status   Specimen Description BLOOD RIGHT ANTECUBITAL  Final   Special Requests BOTTLES DRAWN AEROBIC AND ANAEROBIC 10CC  Final   Culture NO GROWTH 1 DAY  Final   Report Status PENDING  Incomplete  Culture, blood (x 2)     Status: None (Preliminary result)   Collection Time: 06/02/15  9:15 AM  Result Value Ref Range Status   Specimen Description BLOOD LEFT ANTECUBITAL  Final   Special Requests BOTTLES DRAWN AEROBIC AND ANAEROBIC 10CC  Final   Culture NO GROWTH 1 DAY  Final    Report Status PENDING  Incomplete     Studies: Ct Head Wo Contrast  06/02/2015   CLINICAL DATA:  Seizure activity  EXAM: CT HEAD WITHOUT CONTRAST  TECHNIQUE: Contiguous axial images were obtained from the base of the skull through the vertex without intravenous contrast.  COMPARISON:  05/25/2015  FINDINGS: Bony calvarium is intact. Persistent hemorrhage is noted within the parenchyma of the frontal parietal lobe on the left. Some extension into the corpus callosum is noted. Mild midline shift of approximately 4 mm from left to right is noted. This is relatively stable from the previous exam. Surrounding edema is noted. No new focal areas of acute hemorrhage, acute infarction or space-occupying mass lesion are noted. The degree of residual blood within the occipital horns of the lateral ventricles has decreased.  IMPRESSION: Persistent but somewhat resolving hemorrhage in the left frontoparietal region. No new focal abnormality is seen.   Electronically Signed   By: Inez Catalina M.D.   On: 06/02/2015 10:22    Scheduled Meds: . docusate sodium  100 mg Oral Daily  . fluconazole  200 mg Oral Daily  . levETIRAcetam  1,500 mg Oral BID  . QUEtiapine  25 mg Oral QHS  . sulfamethoxazole-trimethoprim  1 tablet Oral Q12H   Continuous Infusions:    Time spent: 15 minutes  [facility] [email or web address], password [facility] 06/04/2015, 9:27 AM  LOS: 2 days

## 2015-06-04 NOTE — H&P (Signed)
Physical Medicine and Rehabilitation Admission H&P    CC: Right sided weakness, difficulty speaking and seizures.    HPI:  Ashley [name] is a 39 y.o. Postpartum female admitted on 05/20/15 with complaints of HA since delivery  She was admitted on 05/21/15 with right facial droop with aphasia, right sided weakness and seizures. She was found to have acute large left fronto-parietal IPH/SAH and was started on Keppra for treatment. She developed vasogenic edema and was treated with hypertonic saline and fallow up CCT 08/12 showed stability.  Work up without definite cerebral venous sinus/cortical vein thrombosis and no anticoagulation needed. Patient with resultant dense left hemiplegia, aphasia as well as dysphagia and was admitted to CIR on 05/28/15 for rehab. She has had issues with sleep wake disruption as well as anxiety which has improved with addition of Seroquel. Foley was discontinued on 08/16 and she was started on Keflex for Klebsiella UTI.  With increase in activity level, she has had worsening of RLE spasms and Zanaflex was added and titrated to bid to help with symptoms. Blood pressures were controlled and po intake had improved. On 06/02/15 am, patient developed seizures with obtundation. She was noted to be severely hypotensive and was treated with fluid bolus as well as IV Keppra and IV antibiotics due to concerns of sepsis.   She was transferred to stepdown unit for closer monitoring.    Follow up CT head showed resolving left frontoparietal hemorrhage. Keppra was increased to 1500 mg bid and patient was back to baseline later than am.  Dr. Doy Mince recommended treating infection and recommended use of ativan alone for treatment of anxiety/insomnia.  Patient has been seizure free and has had improvement in use of RUE as well as verbal output.  Therapy was re-initiated this am and patient re-admitted back to complete her rehab course.    Review of Systems  HENT: Negative for hearing  loss and tinnitus.   Eyes: Negative for blurred vision and double vision.  Respiratory: Negative for cough, shortness of breath and wheezing.   Cardiovascular: Negative for chest pain and palpitations.  Gastrointestinal: Negative for heartburn, nausea, abdominal pain and constipation.  Genitourinary: Negative for dysuria, urgency and frequency.  Musculoskeletal: Negative for myalgias, back pain and neck pain.  Neurological: Positive for dizziness, sensory change, speech change, focal weakness and headaches. Negative for tingling.  Psychiatric/Behavioral: The patient is nervous/anxious and has insomnia.       Past Medical History  Diagnosis Date  . Anxiety   . ICH (intracerebral hemorrhage)     Past Surgical History  Procedure Laterality Date  . Dilation and curettage of uterus     Family History  Problem Relation Age of Onset  . Cancer Mother   . Cancer Father     Social History:  Married. Has 29 week old and 2 step children at home. Per reports working and active PTA. reports that she has never smoked. She has never used smokeless tobacco. She reports that she drinks alcohol. She reports that she does not use illicit drugs.    Allergies: No Known Allergies    Medications Prior to Admission  Medication Sig Dispense Refill  . acetaminophen (TYLENOL) 500 MG tablet Take 1,000 mg by mouth every 6 (six) hours as needed for headache.    Marland Kitchen acyclovir (ZOVIRAX) 400 MG tablet Take 400 mg by mouth 5 (five) times daily as needed (for breakout).     Marland Kitchen ampicillin (PRINCIPEN) 500 MG capsule Take 500 mg by mouth  2 (two) times daily as needed.  5  . aspirin-acetaminophen-caffeine (EXCEDRIN MIGRAINE) 537-482-70 MG per tablet Take 1-2 tablets by mouth every 6 (six) hours as needed for headache or migraine.    . docusate sodium (COLACE) 100 MG capsule Take 100 mg by mouth daily.    Marland Kitchen FLUoxetine (PROZAC) 40 MG capsule Take 40 mg by mouth daily.    Marland Kitchen ibuprofen (ADVIL,MOTRIN) 600 MG tablet Take 1  tablet (600 mg total) by mouth every 6 (six) hours as needed for moderate pain or cramping. 40 tablet 0  . Prenatal Vit-Fe Fumarate-FA (PRENATAL MULTIVITAMIN) TABS tablet Take 1 tablet by mouth daily at 12 noon.      Home: Home Living Family/patient expects to be discharged to:: Private residence Living Arrangements: Spouse/significant other, Children, Parent Available Help at Discharge: Family, Available 24 hours/day Type of Home: House Home Access: Stairs to enter [employer] of Steps: 3-4 in front and 2 via garage Entrance Stairs-Rails: None Home Layout: Two level, Bed/bath upstairs Alternate Level Stairs-Number of Steps: full flight  Alternate Level Stairs-Rails: Right, Left Bathroom Shower/Tub: Tub/shower unit, Curtain, Other (comment) Bathroom Toilet: Standard Bathroom Accessibility: Yes Home Equipment: None Additional Comments: lives with spouse; 2wk yo son; shares custody of 2 children 40yo, 29yo; Parents and inlaws retired & can assist; Also sister n Sports coach.    Lives With: Spouse   Functional History: Prior Function Level of Independence: Independent Comments: Medical sales; running  Functional Status:  Mobility: Bed Mobility Overal bed mobility: Needs Assistance Bed Mobility: Rolling, Sidelying to Sit Rolling: Supervision (to the right) Sidelying to sit: Mod assist Transfers Overall transfer level: Needs assistance Equipment used: Standard walker Transfers: Sit to/from Stand, Stand Pivot Transfers Sit to Stand: Min assist Stand pivot transfers: Mod assist      ADL: ADL Overall ADL's : Needs assistance/impaired Eating/Feeding: Set up, Supervision/ safety, Sitting Grooming: Brushing hair, Wash/dry face, Moderate assistance, Sitting Grooming Details (indicate cue type and reason): Pt knew what to do with comb, but having difficulty due to having to use left hand and pt with multiple tangles Upper Body Bathing: Moderate assistance Upper Body Bathing  Details (indicate cue type and reason): Due to cues needed to sequence for throughness Lower Body Bathing: Moderate assistance Lower Body Bathing Details (indicate cue type and reason): with min A sit<>stand, Mod A to maintain standing while washing peri area. A to hold RLE crossed over LLE Upper Body Dressing : Maximal assistance, Sitting Lower Body Dressing: Maximal assistance (with min A sit<>stand and Mod A to maintain balance with dynamic task) Toilet Transfer: Moderate assistance, Stand-pivot, BSC Toileting- Clothing Manipulation and Hygiene: Moderate assistance (with min A sit<>stand and Mod A to maintain standing for peri-hygiene)  Cognition: Cognition Overall Cognitive Status: Impaired/Different from baseline Orientation Level: Oriented to person, Other (comment) (expressive aphasia ) Cognition Arousal/Alertness: Lethargic Behavior During Therapy: Flat affect Overall Cognitive Status: Impaired/Different from baseline Area of Impairment: Attention, Safety/judgement, Awareness, Problem solving Current Attention Level: Sustained Following Commands: Follows one step commands consistently Safety/Judgement: Decreased awareness of safety Awareness: Intellectual Problem Solving: Requires verbal cues   Blood pressure 94/59, pulse 84, temperature 98.9 F (37.2 C), temperature source Oral, resp. rate 20, height _0  (1.6 m), weight 64.5 kg (142 lb 3.2 oz), SpO2 98 %, unknown if currently breastfeeding. Physical Exam  Nursing note and vitals reviewed. Constitutional: She appears well-developed and well-nourished.  HENT:  Head: Normocephalic and atraumatic.  Mouth/Throat: Oropharynx is clear and moist.  Eyes: Conjunctivae are normal. Pupils are  equal, round, and reactive to light.  Neck: Normal range of motion. Neck supple.  Healed abrasions left neck.   Cardiovascular: Normal rate and regular rhythm.   Respiratory: Effort normal and breath sounds normal. No respiratory distress. She  has no wheezes.  GI: Soft. Bowel sounds are normal. She exhibits no distension. There is no tenderness.  Musculoskeletal: She exhibits no edema or tenderness.  Neurological: She is alert.  Expressive > receptive aphasia.  Was able to state place as hospital but needed cues for "[facility]". She was able to follow simple one step questions. Rights hemisensory deficits with hemiparesis. RLE with extensor tone. RUE 1-2/5. RLE 1-2/5 with apraxia. Sensory 1/2--senses general pain and gross touch.   Skin: Skin is warm and dry.  Psychiatric: She has a normal mood and affect. Her behavior is normal.    Results for orders placed or performed during the hospital encounter of 06/02/15 (from the past 48 hour(s))  Lactic acid, plasma     Status: None   Collection Time: 06/02/15 12:45 PM  Result Value Ref Range   Lactic Acid, Venous 1.4 0.5 - 2.0 mmol/L  Magnesium     Status: None   Collection Time: 06/02/15 12:45 PM  Result Value Ref Range   Magnesium 1.8 1.7 - 2.4 mg/dL  Phosphorus     Status: None   Collection Time: 06/02/15 12:45 PM  Result Value Ref Range   Phosphorus 4.3 2.5 - 4.6 mg/dL  Basic metabolic panel     Status: Abnormal   Collection Time: 06/03/15  3:00 AM  Result Value Ref Range   Sodium 135 135 - 145 mmol/L   Potassium 3.9 3.5 - 5.1 mmol/L   Chloride 98 (L) 101 - 111 mmol/L   CO2 28 22 - 32 mmol/L   Glucose, Bld 94 65 - 99 mg/dL   BUN 7 6 - 20 mg/dL   Creatinine, Ser 0.65 0.44 - 1.00 mg/dL   Calcium 8.6 (L) 8.9 - 10.3 mg/dL   GFR calc non Af Amer >60 >60 mL/min   GFR calc Af Amer >60 >60 mL/min    Comment: (NOTE) The eGFR has been calculated using the CKD EPI equation. This calculation has not been validated in all clinical situations. eGFR's persistently <60 mL/min signify possible Chronic Kidney Disease.    Anion gap 9 5 - 15  CBC     Status: Abnormal   Collection Time: 06/03/15  3:00 AM  Result Value Ref Range   WBC 6.8 4.0 - 10.5 K/uL   RBC 4.26 3.87 - 5.11 MIL/uL    Hemoglobin 12.6 12.0 - 15.0 g/dL   HCT 38.8 36.0 - 46.0 %   MCV 91.1 78.0 - 100.0 fL   MCH 29.6 26.0 - 34.0 pg   MCHC 32.5 30.0 - 36.0 g/dL   RDW 13.7 11.5 - 15.5 %   Platelets 403 (H) 150 - 400 K/uL   No results found.     Medical Problem List and Plan: 1. Functional deficits secondary to Left fronto-parietal ICH with right spastic hemiparesis, expressive > receptive aphasia,  2.  DVT Prophylaxis/Anticoagulation: Pharmaceutical: Lovenox 3. Pain Management:  Oxycodone prn severe headaches.  4. Mood: Team to provide ego support. Continue ativan prn for anxiety. Off prozac at this time and will monitor mood for now. LCSW to follow for evaluation and support.  5. Neuropsych: This patient is not capable of making decisions on her own behalf. 6. Skin/Wound Care: Routine pressure relief measures 7. Fluids/Electrolytes/Nutrition: Monitor I/O.  Offer supplements between meals and family encouraged to bring meals from homes.  8. Klebsiella UTI: Continue bactrim for treatment.  9.  Seizures: Continue Keppra 1500 mg bid (increased over weekend) 10. HTN: Monitor BP bid and treat as indicated.     Post Admission Physician Evaluation:   Interrupted stay. Please see initial Amaya, MD, [facility] 06/04/2015   06/04/2015

## 2015-06-04 NOTE — Discharge Summary (Signed)
Physician Discharge Summary  Ashley [name] VOZ:[id number] DOB: December 29, 1975 DOA: 06/02/2015  PCP: No PCP Per Patient  Admit date: 06/02/2015 Discharge date: 06/04/2015  Time spent: greater than 30 minutes  Recommendations for Outpatient Follow-up:   to inpatient rehab  Discharge Diagnoses:  Principal Problem:   Seizure disorder Active Problems:   ICH (intracerebral hemorrhage)   Hypotension   Sepsis   UTI (urinary tract infection)   Discharge Condition: stable  Diet recommendation: general  Filed Weights   06/02/15 0700  Weight: 64.5 kg (142 lb 3.2 oz)    History of present illness:  39 y.o. female admitted on 08/07 with a Postpartum SAH/ICH of Left Fronto-Parietal area with Residual Symptoms of Right Hemiparesis, Aphasia, Dysphagia and Seizures who had been discharged to the Rehab Unit on 08/15 for Rehab Therapy and this AM around 0445 she was being assisted to the bathroom to void, and was witnessed as having her eyes roll back and tonic clonic activity that lasted 2 minutes. Rapid Response was called and she was found to have hypotension with a blood pressure of 70/43. She was administered a 250cc IV Bolus and had improvement in her pressure per orders of Dr. Letta Pate, and the [facility] were called to assess. On assessment the patient was found to be Obtunded, and her O2 saturations were found to be 98%, and a spot check Glucose was 92 and an improving blood pressure ith a systolic of 89. A 500 mg loading dose of IV Keppra was ordered and administered. Arrangements were made to admit to the [facility] for further evaluation and treatment and Neurology was consulted.   Hospital Course:  Admitted to step down. Keppra loaded and patient change to 1500 mg by mouth twice a day. Has had no further seizures.  With respect to hypotension, initially, started on broad-spectrum antibiotics, vancomycin and Zosyn due to concern of sepsis. She had had a urinary tract  infection diagnosed in rehabilitation and was on adequate therapy, Keflex. Blood cultures have remained negative. The culture grew out Klebsiella which was sensitive to most. Switched to Bactrim for a few more days. Would complete a seven-day course, to stop on 8/27.  Has worked with physical therapy and will go back to inpatient rehabilitation.  Procedures:  none  Consultations:  neurology  Discharge Exam: Filed Vitals:   06/04/15 1356  BP: 106/65  Pulse: 79  Temp: 98.5 F (36.9 C)  Resp: 20   See progress note.  Discharge Instructions   Discharge Instructions    Diet general    Complete by:  As directed      Walk with assistance    Complete by:  As directed           Current Discharge Medication List    START taking these medications   Details  levETIRAcetam (KEPPRA) 750 MG tablet Take 2 tablets (1,500 mg total) by mouth 2 (two) times daily.    sulfamethoxazole-trimethoprim (BACTRIM DS,SEPTRA DS) 800-160 MG per tablet Take 1 tablet by mouth every 12 (twelve) hours. Through 06/09/15, then stop    traZODone (DESYREL) 50 MG tablet Take 1 tablet (50 mg total) by mouth at bedtime as needed for sleep.      CONTINUE these medications which have NOT CHANGED   Details  acetaminophen (TYLENOL) 500 MG tablet Take 1,000 mg by mouth every 6 (six) hours as needed for headache.    acyclovir (ZOVIRAX) 400 MG tablet Take 400 mg by mouth 5 (five) times daily as needed (for  breakout).     docusate sodium (COLACE) 100 MG capsule Take 100 mg by mouth daily.    FLUoxetine (PROZAC) 40 MG capsule Take 40 mg by mouth daily.    ibuprofen (ADVIL,MOTRIN) 600 MG tablet Take 1 tablet (600 mg total) by mouth every 6 (six) hours as needed for moderate pain or cramping. Qty: 40 tablet, Refills: 0      STOP taking these medications     ampicillin (PRINCIPEN) 500 MG capsule      aspirin-acetaminophen-caffeine (EXCEDRIN MIGRAINE) 502-774-12 MG per tablet      Prenatal Vit-Fe Fumarate-FA  (PRENATAL MULTIVITAMIN) TABS tablet        No Known Allergies    The results of significant diagnostics from this hospitalization (including imaging, microbiology, ancillary and laboratory) are listed below for reference.    Significant Diagnostic Studies: Ct Angio Head W/cm &/or Wo Cm  05/23/2015   CLINICAL DATA:  39 year old female with intracranial hemorrhage 2 weeks postpartum. Possible venous infarct, sinus thrombosis, postpartum angiopathy. Initial encounter.  EXAM: CT ANGIOGRAPHY HEAD  TECHNIQUE: Multidetector CT imaging of the head was performed using the standard protocol during bolus administration of intravenous contrast. Multiplanar CT image reconstructions and MIPs were obtained to evaluate the vascular anatomy.  CONTRAST:  42mL OMNIPAQUE IOHEXOL 350 MG/ML SOLN  COMPARISON:  Head CT without contrast 05/22/2015. Brain MRI, intracranial MRA and intracranial MRV 05/21/2015 and earlier  FINDINGS: Intra-axial hemorrhage involving the posterior body of the corpus callosum and the left parietal and posterior temporal lobe with intraventricular extension re - identified. The volume of intraventricular blood in the occipital horns appears mildly increased (series 2, image 36 on the right). Subarachnoid extension of hemorrhage on the left again noted and widespread along the left convexity, although not significantly changed since 05/20/2015. Stable ventricle size and configuration. Basilar cisterns remain patent. No significant midline shift. Edema in the posterior superior left hemisphere about the intraparenchymal blood has mildly increased as expected since 05/20/2015.  Posterior circulation:  Codominant distal vertebral arteries. Patent PICA origins. Patent vertebrobasilar junction. No basilar artery stenosis. Normal right PCA origin. The left PCA origin and P1 segment appear more diminutive and irregular than on the recent MRA. Likewise, the left P2 and distal vessel appears small, irregular,  and is less enhancing than that on the right.  Anterior circulation:  Distal cervical ICAs appear patent. There is cavernous sinus venous contamination limiting detail of the ICA siphons. Both carotid termini are patent. The MCA and ACA origins appear stable. The left A1 segment is dominant as before. The anterior communicating artery is patent. The bilateral MCA M1 segments remain patent. However, the left MCA bifurcation appears more diminutive and irregular than the right, and also when compared to the recent MRA. Left MCA M2 branches then are partially obscured by subarachnoid hemorrhage in the sylvian fissure. Likewise, the right MCA branches have a more normal appearance. The bilateral ACA branches have a more normal appearance.  Venous sinuses:  Major dural venous sinuses appear patent, including the straight sinus, vein of Galen, and cavernous sinuses. No abnormal draining vessels identified about the parenchymal hemorrhage site.  Anatomic variants: Dominant left ACA A1 segment.  IMPRESSION: 1. Suboptimal CTA technique, but I suspect interval vasospasm in the left PCA and MCA branches when compared to the recent MRA. 2. Normal dural venous sinus patency. No evidence of vascular malformation identified. No cortical vein thrombosis identified. Recommend repeat imaging (MRI Head without and with contrast may be the most beneficial) once the  parenchymal hematoma resolves. 3. Mild progression of intraventricular blood volume since yesterday, but stable ventricle size. 4. No significant change in the volume of intra-axial and left hemisphere subarachnoid hemorrhage since 05/20/2015. Stable regional mass effect. Salient findings reviewed in person with Dr. Antony Contras on 05/23/2015 at 0854 hours.   Electronically Signed   By: Genevie Ann M.D.   On: 05/23/2015 09:20   Ct Head Wo Contrast  06/02/2015   CLINICAL DATA:  Seizure activity  EXAM: CT HEAD WITHOUT CONTRAST  TECHNIQUE: Contiguous axial images were obtained  from the base of the skull through the vertex without intravenous contrast.  COMPARISON:  05/25/2015  FINDINGS: Bony calvarium is intact. Persistent hemorrhage is noted within the parenchyma of the frontal parietal lobe on the left. Some extension into the corpus callosum is noted. Mild midline shift of approximately 4 mm from left to right is noted. This is relatively stable from the previous exam. Surrounding edema is noted. No new focal areas of acute hemorrhage, acute infarction or space-occupying mass lesion are noted. The degree of residual blood within the occipital horns of the lateral ventricles has decreased.  IMPRESSION: Persistent but somewhat resolving hemorrhage in the left frontoparietal region. No new focal abnormality is seen.   Electronically Signed   By: Inez Catalina M.D.   On: 06/02/2015 10:22   Ct Head Wo Contrast  05/25/2015   CLINICAL DATA:  Intracranial hemorrhage follow-up  EXAM: CT HEAD WITHOUT CONTRAST  TECHNIQUE: Contiguous axial images were obtained from the base of the skull through the vertex without intravenous contrast.  COMPARISON:  05/23/2015  FINDINGS: Stable, large left posterior frontoparietal lobe hematoma with extension into the corpus callosum similar in size compared with the prior examination with mild surrounding on the lateral ventricle vasogenic edema. There is mild downward mass effect on the lateral ventricle left greater than right. There is 4 mm of left-to-right midline shift.  There is small volume left hemispheric subarachnoid hemorrhage which has improved compared with 05/23/2015.  There is stable small volume intraventricular hemorrhage dependently within the occipital horns.  There is no other foci of hemorrhage.  The paranasal sinuses are clear. The mastoid sinuses are clear the calvarium is intact. The visualized orbits are unremarkable.  IMPRESSION: 1. Stable, large left posterior frontoparietal lobe hematoma extending into the corpus callosum with mild  surrounding vasogenic edema. 4 mm of left-to-right midline shift. No hydrocephalus. 2. Small volume left hemispheric subarachnoid hemorrhage which has improved compared with 05/23/2015. 3. Stable small volume intraventricular hemorrhage dependently within the occipital horns.   Electronically Signed   By: Kathreen Devoid   On: 05/25/2015 08:11   Ct Head Wo Contrast  05/22/2015   CLINICAL DATA:  39 year old female with intracranial hemorrhage. Follow-up. Subsequent encounter.  EXAM: CT HEAD WITHOUT CONTRAST  TECHNIQUE: Contiguous axial images were obtained from the base of the skull through the vertex without intravenous contrast.  COMPARISON:  05/21/2015 MR and 05/20/2015 CT.  FINDINGS: Large left posterior frontal -parietal lobe hematoma with extension into the corpus callosum has overall size similar to the prior exam. Interval progression of surrounding vasogenic edema. Mass effect upon the lateral ventricles greater on the left with downward compression better appreciated on recent MR which included coronal imaging. Minimal bowing of the septum towards the right.  Extensive subarachnoid hemorrhage greatest left hemisphere most prominent left sylvian fissure and left convexity minimal changed from prior exam.  Tiny amount of deep dependent interventricular blood similar to the MR.  Cause of  intracranial hemorrhage is indeterminate.  No CT evidence of large acute thrombotic infarct.  No calvarial abnormality.  Mastoid air cells, middle ear cavities and visualized paranasal sinuses are clear.  IMPRESSION: Large left posterior frontal -parietal lobe hematoma with extension into the corpus callosum has overall size similar to the prior exam. Interval progression of surrounding vasogenic edema. Mass effect upon the lateral ventricles greater on the left with downward compression better appreciated on recent MR which included coronal imaging. Minimal bowing of the septum towards the right.  Extensive subarachnoid  hemorrhage greatest left hemisphere most prominent left sylvian fissure and left convexity minimal changed from prior exam.  Tiny amount of deep dependent interventricular blood similar to the MR.  Cause of intracranial hemorrhage is indeterminate.   Electronically Signed   By: Genia Del M.D.   On: 05/22/2015 07:26   Ct Head Wo Contrast  05/21/2015   CLINICAL DATA:  Severe headaches 2 weeks postpartum  EXAM: CT HEAD WITHOUT CONTRAST  TECHNIQUE: Contiguous axial images were obtained from the base of the skull through the vertex without intravenous contrast.  COMPARISON:  None.  FINDINGS: The ventricles are normal in size and configuration. There is a hemorrhage which appears to arise from the left parietal lobe measuring 6.2 x 3.1 cm. This hemorrhage tracks anteriorly and inferiorly throughout the splenium of the corpus callosum involving most of the splenium of the corpus callosum. Hemorrhage also tracks just superior to the left lateral ventricle. There is subarachnoid hemorrhage throughout much of the brain parenchyma in the supratentorial region on the left with a small amount of subarachnoid hemorrhage crossing to the right slightly anterior to the rostrum of the corpus callosum.  There is no subdural or epidural fluid collection. There is no midline shift. Elsewhere gray-white compartments appear normal. The bony calvarium appears intact. The mastoid air cells are clear.  IMPRESSION: Extensive intraparenchymal hemorrhage which arises from the left parietal lobe and extends into the splenium of the corpus callosum and slightly superior to the left lateral ventricle. There is extensive subarachnoid hemorrhage on the left with a small amount of subarachnoid hemorrhage crossing to the right anteriorly. The ventricles are not efface, and there is no midline shift. There is no extra-axial fluid. Elsewhere gray-white compartments appear normal.  Critical Value/emergent results were called by telephone at the  time of interpretation on 05/21/2015 at 12:12 am to Dr. Doy Mince, neurology, who verbally acknowledged these results.   Electronically Signed   By: Lowella Grip III M.D.   On: 05/21/2015 00:12   Mr Jodene Nam Head Wo Contrast  05/21/2015   CLINICAL DATA:  Headache today, worse than typical migraine. Acute onset RIGHT-sided weakness. Acute RIGHT focal seizure in emergency department. Two weeks postpartum. Evaluate intracranial hemorrhage.  EXAM: MRI HEAD WITHOUT CONTRAST  MRA HEAD WITHOUT CONTRAST  MRV HEAD WITHOUT CONTRAST  TECHNIQUE: Multiplanar, multiecho pulse sequences of the brain and surrounding structures were obtained without and with intravenous contrast. Angiographic images of the head were obtained using MRA and MRV technique without contrast. MIP images provided.  COMPARISON:  CT head May 20, 2015 at 2354 hours  FINDINGS: MRI HEAD FINDINGS  Acute mesial LEFT parietal intraparenchymal hematoma better seen on prior CT, dissecting along the body of the corpus callosum to the RIGHT. Intraventricular extension with layering blood products in the LEFT lateral ventricle. No hydrocephalus. Minimal surrounding T2 bright vasogenic edema, local sulcal effacement without midline shift. Mass effect on LEFT lateral ventricle. No satellite areas of abnormal parenchymal signal.  Susceptibility artifact along the interhemispheric fissure, LEFT sylvian fissure in LEFT cerebellar tentorium consistent with acute blood products seen on today's CT. Bilateral hippocampi demonstrate normal size, morphology and signal characteristics.  Ocular globes and orbital contents are unremarkable. Paranasal sinuses and mastoid air cells are well aerated. No abnormal sellar expansion. No cerebellar tonsillar ectopia. No suspicious calvarial bone marrow signal.  MRA HEAD FINDINGS  Anterior circulation: Normal flow related enhancement of the included cervical, petrous, cavernous and supra clinoid internal carotid arteries. Patent anterior  communicating artery. Normal flow related enhancement of the anterior and middle cerebral arteries, including more distal segments. RIGHT AC 1 segment is dominant.  No large vessel occlusion, high-grade stenosis, abnormal luminal irregularity, aneurysm.  Posterior circulation: Codominant vertebral arteries. Basilar artery is patent, with normal flow related enhancement of the main branch vessels. Patent posterior cerebral arteries. Slightly decreased and flow related enhancement of the distal LEFT posterior cerebral artery though, with normal in caliber, likely representing slow flow secondary to local mass effect.  No large vessel occlusion, high-grade stenosis, abnormal luminal irregularity, aneurysm.  MRV HEAD FINDINGS  Normal flow related enhancement within the superior sagittal sinus, torcula of the Herophili, bilateral transverse, sigmoid sinuses and included internal jugular veins. Normal flow related enhancement of the internal cerebral veins.  IMPRESSION: MRI HEAD: Acute large LEFT parietal intraparenchymal hematoma dissecting along the corpus callosum, intraventricular extension without obstructive hydrocephalus. Scattered subarachnoid hemorrhage better seen on today's CT head.  Findings suggest postpartum angiopathy ; postpartum angiopathy typically affects the medium and small vessels which are not well characterized on MRA of the head; follow-up CTA of the head or digital subtraction angiography could be performed as clinically indicated . Location is also typical for venous infarction though, there is no major dural venous sinus thrombosis. Additional consideration are vascular malformation or hemorrhagic mass. Recommend followup MRI of the head with contrast in 2-4 weeks.  MRA HEAD: No acute vascular process ; normal MRA head.  MRV HEAD: No dural venous sinus thrombosis ; normal noncontrast MRV head.   Electronically Signed   By: Elon Alas M.D.   On: 05/21/2015 03:35   Mr Brain Wo  Contrast  05/21/2015   CLINICAL DATA:  Headache today, worse than typical migraine. Acute onset RIGHT-sided weakness. Acute RIGHT focal seizure in emergency department. Two weeks postpartum. Evaluate intracranial hemorrhage.  EXAM: MRI HEAD WITHOUT CONTRAST  MRA HEAD WITHOUT CONTRAST  MRV HEAD WITHOUT CONTRAST  TECHNIQUE: Multiplanar, multiecho pulse sequences of the brain and surrounding structures were obtained without and with intravenous contrast. Angiographic images of the head were obtained using MRA and MRV technique without contrast. MIP images provided.  COMPARISON:  CT head May 20, 2015 at 2354 hours  FINDINGS: MRI HEAD FINDINGS  Acute mesial LEFT parietal intraparenchymal hematoma better seen on prior CT, dissecting along the body of the corpus callosum to the RIGHT. Intraventricular extension with layering blood products in the LEFT lateral ventricle. No hydrocephalus. Minimal surrounding T2 bright vasogenic edema, local sulcal effacement without midline shift. Mass effect on LEFT lateral ventricle. No satellite areas of abnormal parenchymal signal. Susceptibility artifact along the interhemispheric fissure, LEFT sylvian fissure in LEFT cerebellar tentorium consistent with acute blood products seen on today's CT. Bilateral hippocampi demonstrate normal size, morphology and signal characteristics.  Ocular globes and orbital contents are unremarkable. Paranasal sinuses and mastoid air cells are well aerated. No abnormal sellar expansion. No cerebellar tonsillar ectopia. No suspicious calvarial bone marrow signal.  MRA HEAD FINDINGS  Anterior circulation:  Normal flow related enhancement of the included cervical, petrous, cavernous and supra clinoid internal carotid arteries. Patent anterior communicating artery. Normal flow related enhancement of the anterior and middle cerebral arteries, including more distal segments. RIGHT AC 1 segment is dominant.  No large vessel occlusion, high-grade stenosis,  abnormal luminal irregularity, aneurysm.  Posterior circulation: Codominant vertebral arteries. Basilar artery is patent, with normal flow related enhancement of the main branch vessels. Patent posterior cerebral arteries. Slightly decreased and flow related enhancement of the distal LEFT posterior cerebral artery though, with normal in caliber, likely representing slow flow secondary to local mass effect.  No large vessel occlusion, high-grade stenosis, abnormal luminal irregularity, aneurysm.  MRV HEAD FINDINGS  Normal flow related enhancement within the superior sagittal sinus, torcula of the Herophili, bilateral transverse, sigmoid sinuses and included internal jugular veins. Normal flow related enhancement of the internal cerebral veins.  IMPRESSION: MRI HEAD: Acute large LEFT parietal intraparenchymal hematoma dissecting along the corpus callosum, intraventricular extension without obstructive hydrocephalus. Scattered subarachnoid hemorrhage better seen on today's CT head.  Findings suggest postpartum angiopathy ; postpartum angiopathy typically affects the medium and small vessels which are not well characterized on MRA of the head; follow-up CTA of the head or digital subtraction angiography could be performed as clinically indicated . Location is also typical for venous infarction though, there is no major dural venous sinus thrombosis. Additional consideration are vascular malformation or hemorrhagic mass. Recommend followup MRI of the head with contrast in 2-4 weeks.  MRA HEAD: No acute vascular process ; normal MRA head.  MRV HEAD: No dural venous sinus thrombosis ; normal noncontrast MRV head.   Electronically Signed   By: Elon Alas M.D.   On: 05/21/2015 03:35   Dg Chest Port 1 View  06/02/2015   CLINICAL DATA:  Sepsis  EXAM: PORTABLE CHEST - 1 VIEW  COMPARISON:  05/21/2015  FINDINGS: The heart size and mediastinal contours are within normal limits. Both lungs are clear. The visualized  skeletal structures are unremarkable.  IMPRESSION: No active disease.   Electronically Signed   By: Inez Catalina M.D.   On: 06/02/2015 07:02   Dg Chest Portable 1 View  05/21/2015   CLINICAL DATA:  Central line repositioning.  Initial encounter.  EXAM: PORTABLE CHEST - 1 VIEW  COMPARISON:  None.  FINDINGS: The patient's left IJ line has been retracted, and is now seen ending overlying the proximal SVC.  The lungs are well-aerated and clear. There is no evidence of focal opacification, pleural effusion or pneumothorax.  The cardiomediastinal silhouette is borderline normal in size. No acute osseous abnormalities are seen.  IMPRESSION: 1. Left IJ line has been retracted, and is now seen ending overlying the proximal SVC. 2. No acute cardiopulmonary process seen.   Electronically Signed   By: Garald Balding M.D.   On: 05/21/2015 02:21   Dg Chest Portable 1 View  05/21/2015   CLINICAL DATA:  Left central line placement.  Initial encounter.  EXAM: PORTABLE CHEST - 1 VIEW  COMPARISON:  None.  FINDINGS: The patient's left IJ line is noted extending overlying the right brachiocephalic vein. This should be retracted at least 5 cm and repositioned.  The lungs are hypoexpanded. No pleural effusion or pneumothorax is seen.  The cardiomediastinal silhouette is borderline normal in size. No acute osseous abnormalities are identified.  IMPRESSION: 1. Left IJ line noted ending overlying the right brachiocephalic vein. This should be retracted at least 5 cm and repositioned, as deemed clinically appropriate.  2. Lungs hypoexpanded but grossly clear.  These results were called by telephone at the time of interpretation on 05/21/2015 at 2:15 am to [facility], who verbally acknowledged these results.   Electronically Signed   By: Garald Balding M.D.   On: 05/21/2015 02:15   Mr Mrv Head Wo Cm  05/21/2015   CLINICAL DATA:  Headache today, worse than typical migraine. Acute onset RIGHT-sided weakness. Acute RIGHT focal  seizure in emergency department. Two weeks postpartum. Evaluate intracranial hemorrhage.  EXAM: MRI HEAD WITHOUT CONTRAST  MRA HEAD WITHOUT CONTRAST  MRV HEAD WITHOUT CONTRAST  TECHNIQUE: Multiplanar, multiecho pulse sequences of the brain and surrounding structures were obtained without and with intravenous contrast. Angiographic images of the head were obtained using MRA and MRV technique without contrast. MIP images provided.  COMPARISON:  CT head May 20, 2015 at 2354 hours  FINDINGS: MRI HEAD FINDINGS  Acute mesial LEFT parietal intraparenchymal hematoma better seen on prior CT, dissecting along the body of the corpus callosum to the RIGHT. Intraventricular extension with layering blood products in the LEFT lateral ventricle. No hydrocephalus. Minimal surrounding T2 bright vasogenic edema, local sulcal effacement without midline shift. Mass effect on LEFT lateral ventricle. No satellite areas of abnormal parenchymal signal. Susceptibility artifact along the interhemispheric fissure, LEFT sylvian fissure in LEFT cerebellar tentorium consistent with acute blood products seen on today's CT. Bilateral hippocampi demonstrate normal size, morphology and signal characteristics.  Ocular globes and orbital contents are unremarkable. Paranasal sinuses and mastoid air cells are well aerated. No abnormal sellar expansion. No cerebellar tonsillar ectopia. No suspicious calvarial bone marrow signal.  MRA HEAD FINDINGS  Anterior circulation: Normal flow related enhancement of the included cervical, petrous, cavernous and supra clinoid internal carotid arteries. Patent anterior communicating artery. Normal flow related enhancement of the anterior and middle cerebral arteries, including more distal segments. RIGHT AC 1 segment is dominant.  No large vessel occlusion, high-grade stenosis, abnormal luminal irregularity, aneurysm.  Posterior circulation: Codominant vertebral arteries. Basilar artery is patent, with normal flow  related enhancement of the main branch vessels. Patent posterior cerebral arteries. Slightly decreased and flow related enhancement of the distal LEFT posterior cerebral artery though, with normal in caliber, likely representing slow flow secondary to local mass effect.  No large vessel occlusion, high-grade stenosis, abnormal luminal irregularity, aneurysm.  MRV HEAD FINDINGS  Normal flow related enhancement within the superior sagittal sinus, torcula of the Herophili, bilateral transverse, sigmoid sinuses and included internal jugular veins. Normal flow related enhancement of the internal cerebral veins.  IMPRESSION: MRI HEAD: Acute large LEFT parietal intraparenchymal hematoma dissecting along the corpus callosum, intraventricular extension without obstructive hydrocephalus. Scattered subarachnoid hemorrhage better seen on today's CT head.  Findings suggest postpartum angiopathy ; postpartum angiopathy typically affects the medium and small vessels which are not well characterized on MRA of the head; follow-up CTA of the head or digital subtraction angiography could be performed as clinically indicated . Location is also typical for venous infarction though, there is no major dural venous sinus thrombosis. Additional consideration are vascular malformation or hemorrhagic mass. Recommend followup MRI of the head with contrast in 2-4 weeks.  MRA HEAD: No acute vascular process ; normal MRA head.  MRV HEAD: No dural venous sinus thrombosis ; normal noncontrast MRV head.   Electronically Signed   By: Elon Alas M.D.   On: 05/21/2015 03:35    Microbiology: Recent Results (from the past 240 hour(s))  Urine culture     Status:  None   Collection Time: 05/30/15  4:28 PM  Result Value Ref Range Status   Specimen Description URINE, RANDOM  Final   Special Requests NONE  Final   Culture >=100,000 COLONIES/mL KLEBSIELLA PNEUMONIAE  Final   Report Status 06/01/2015 FINAL  Final   Organism ID, Bacteria  KLEBSIELLA PNEUMONIAE  Final      Susceptibility   Klebsiella pneumoniae - MIC*    AMPICILLIN >=32 RESISTANT Resistant     CEFAZOLIN <=4 SENSITIVE Sensitive     CEFTRIAXONE <=1 SENSITIVE Sensitive     CIPROFLOXACIN <=0.25 SENSITIVE Sensitive     GENTAMICIN <=1 SENSITIVE Sensitive     IMIPENEM <=0.25 SENSITIVE Sensitive     NITROFURANTOIN 64 INTERMEDIATE Intermediate     TRIMETH/SULFA <=20 SENSITIVE Sensitive     AMPICILLIN/SULBACTAM 4 SENSITIVE Sensitive     PIP/TAZO <=4 SENSITIVE Sensitive     * >=100,000 COLONIES/mL KLEBSIELLA PNEUMONIAE  Culture, blood (x 2)     Status: None (Preliminary result)   Collection Time: 06/02/15  9:00 AM  Result Value Ref Range Status   Specimen Description BLOOD RIGHT ANTECUBITAL  Final   Special Requests BOTTLES DRAWN AEROBIC AND ANAEROBIC 10CC  Final   Culture NO GROWTH 2 DAYS  Final   Report Status PENDING  Incomplete  Culture, blood (x 2)     Status: None (Preliminary result)   Collection Time: 06/02/15  9:15 AM  Result Value Ref Range Status   Specimen Description BLOOD LEFT ANTECUBITAL  Final   Special Requests BOTTLES DRAWN AEROBIC AND ANAEROBIC 10CC  Final   Culture NO GROWTH 2 DAYS  Final   Report Status PENDING  Incomplete  Culture, Urine     Status: None   Collection Time: 06/03/15 12:09 AM  Result Value Ref Range Status   Specimen Description URINE, RANDOM  Final   Special Requests NONE  Final   Culture NO GROWTH 1 DAY  Final   Report Status 06/04/2015 FINAL  Final     Labs: Basic Metabolic Panel:  Recent Labs Lab 05/29/15 0540 06/01/15 0605 06/02/15 0014 06/02/15 0600 06/02/15 1245 06/03/15 0300  NA 137 135  --  135  --  135  K 3.9 4.1  --  4.4  --  3.9  CL 103 98*  --  100*  --  98*  CO2 27 29  --  26  --  28  GLUCOSE 95 99  --  93  --  94  BUN <5* 6  --  9  --  7  CREATININE 0.39* 0.50 0.47 0.56  --  0.65  CALCIUM 8.6* 9.8  --  9.0  --  8.6*  MG  --   --   --   --  1.8  --   PHOS  --   --   --   --  4.3  --     Liver Function Tests:  Recent Labs Lab 06/02/15 0600  AST 30  ALT 46  ALKPHOS 75  BILITOT 0.5  PROT 5.7*  ALBUMIN 3.2*   No results for input(s): LIPASE, AMYLASE in the last 168 hours. No results for input(s): AMMONIA in the last 168 hours. CBC:  Recent Labs Lab 06/02/15 0600 06/02/15 0900 06/03/15 0300  WBC 7.3 7.4 6.8  NEUTROABS 4.6 5.3  --   HGB 13.0 12.8 12.6  HCT 39.9 39.8 38.8  MCV 89.3 90.0 91.1  PLT 416* 422* 403*   Cardiac Enzymes: No results for input(s): CKTOTAL, CKMB, CKMBINDEX,  TROPONINI in the last 168 hours. BNP: BNP (last 3 results) No results for input(s): BNP in the last 8760 hours.  ProBNP (last 3 results) No results for input(s): PROBNP in the last 8760 hours.  CBG:  Recent Labs Lab 06/02/15 0522  GLUCAP 92    Signed:  Quantez Schnyder L  [facility] 06/04/2015, 2:51 PM

## 2015-06-04 NOTE — Progress Notes (Signed)
Transported pt per bed to 4w in no acute distress. Family at bedside. Stand pivot to chair, tolerated well.

## 2015-06-04 NOTE — Evaluation (Signed)
Physical Therapy Evaluation Patient Details Name: Ashley [name] MRN: [id number] DOB: Sep 25, 1976 Today's Date: 06/04/2015   History of Present Illness  Ashley [name] is an 39 y.o. female who is two weeks post-partum who developed a headache; CT of the brain shows L parietal ICH w/ SAH and midline shift. Admitted to CIR on 05/28/15, had seizure with low BP on 8/20 and transferred back to acute care.  Clinical Impression  Patient presents with right hemiplegia, cognitive deficits, and impaired sensation/balance impacting mobility and function. Pt is independent PTA and has 3 children. Very supportive husband. Pt highly motivated to return to PLOF. Great candidate for CIR. Will follow acutely to maximize independence and mobility.     Follow Up Recommendations CIR    Equipment Recommendations  Other (comment) (TBD.)    Recommendations for Other Services Rehab consult     Precautions / Restrictions Precautions Precautions: Fall Precaution Comments: Flaccid with noted mild intermittent flexor tone at elbow Restrictions Weight Bearing Restrictions: No      Mobility  Bed Mobility Overal bed mobility: Needs Assistance Bed Mobility: Supine to Sit Rolling: Supervision (to the right) Sidelying to sit: Mod assist Supine to sit: Mod assist;HOB elevated     General bed mobility comments: Assist with RLE and cues to reciprocally scoot bottom to EOB.   Transfers Overall transfer level: Needs assistance Equipment used: Rolling walker (2 wheeled) Transfers: Sit to/from Stand Sit to Stand: Mod assist Stand pivot transfers: Mod assist;+2 physical assistance;Min assist       General transfer comment: Mod A progressing to Min A to stand from EOB x1, from chair x1, from [facility] x1. Cues for weight bearing through RUE and anterior weight shift/translation to power up. Manual cues for upright posture. SPT bed to [facility]; BSC to chair with pivotal steps and cues.   Ambulation/Gait Ambulation/Gait  assistance: Max assist;+2 physical assistance Ambulation Distance (Feet): 4 Feet Assistive device: Rolling walker (2 wheeled) Gait Pattern/deviations: Step-to pattern;Decreased stride length;Narrow base of support;Decreased dorsiflexion - right;Decreased weight shift to right     General Gait Details: Difficulty advancing LLE (pivoting foot some) even with assist for weight shifting. Therapist facilitating advancement of RLE and blocking right knee during stance phase. Pt with inversion right ankle during stance. Cues for upright posture.  Stairs            Wheelchair Mobility    Modified Rankin (Stroke Patients Only) Modified Rankin (Stroke Patients Only) Pre-Morbid Rankin Score: No symptoms Modified Rankin: Moderately severe disability     Balance Overall balance assessment: Needs assistance Sitting-balance support: Feet supported;Single extremity supported Sitting balance-Leahy Scale: Fair Sitting balance - Comments: Able to perform reciprocal scooting for getting to EOB and better positioning in chair with Min A for right pelvis.   Standing balance support: During functional activity Standing balance-Leahy Scale: Poor Standing balance comment: ABle to stand with Mod A for balance, upright posture and right knee stability.                              Pertinent Vitals/Pain Pain Assessment: No/denies pain    Home Living Family/patient expects to be discharged to:: Private residence Living Arrangements: Spouse/significant other;Children;Parent Available Help at Discharge: Family;Available 24 hours/day Type of Home: House Home Access: Stairs to enter Entrance Stairs-Rails: None Entrance Stairs-Number of Steps: 3-4 in front and 2 via garage Home Layout: Two level;Bed/bath upstairs Home Equipment: None Additional Comments: lives with spouse; 2wk yo son; shares custody  of 2 children 66yo, 8yo; Parents and inlaws retired & can assist; Also sister n Sports coach.       Prior Function Level of Independence: Independent         Comments: Medical sales; running     Hand Dominance   Dominant Hand: Right    Extremity/Trunk Assessment   Upper Extremity Assessment: Defer to OT evaluation RUE Deficits / Details: Pt demonstrates full PROM, flaccid other than intermittent mild flexion tone at elbow         Lower Extremity Assessment: RLE deficits/detail RLE Deficits / Details: MIld tone noted throughout RLE. No voluntary movement throughout LLE ~0/5.        Communication   Communication: Receptive difficulties;Expressive difficulties  Cognition Arousal/Alertness: Awake/alert Behavior During Therapy: Flat affect Overall Cognitive Status: Impaired/Different from baseline Area of Impairment: Attention;Safety/judgement;Awareness   Current Attention Level: Sustained   Following Commands: Follows one step commands consistently;Follows one step commands with increased time Safety/Judgement: Decreased awareness of safety Awareness: Intellectual Problem Solving: Requires verbal cues;Requires tactile cues;Difficulty sequencing      General Comments      Exercises        Assessment/Plan    PT Assessment Patient needs continued PT services  PT Diagnosis Abnormality of gait;Hemiplegia dominant side   PT Problem List Decreased strength;Decreased range of motion;Impaired sensation;Decreased activity tolerance;Decreased balance;Decreased mobility;Decreased safety awareness;Impaired tone;Decreased knowledge of use of DME  PT Treatment Interventions DME instruction;Balance training;Gait training;Neuromuscular re-education;Stair training;Functional mobility training;Therapeutic activities;Therapeutic exercise;Patient/family education   PT Goals (Current goals can be found in the Care Plan section) Acute Rehab PT Goals Patient Stated Goal: to get back to rehab then home PT Goal Formulation: With patient/family Time For Goal Achievement:  06/18/15 Potential to Achieve Goals: Good    Frequency Min 4X/week   Barriers to discharge        Co-evaluation               End of Session Equipment Utilized During Treatment: Gait belt Activity Tolerance: Patient tolerated treatment well Patient left: in chair;with call bell/phone within reach;with chair alarm set;with family/visitor present Nurse Communication: Mobility status         Time: 1140-1203 PT Time Calculation (min) (ACUTE ONLY): 23 min   Charges:   PT Evaluation $Initial PT Evaluation Tier I: 1 Procedure PT Treatments $Therapeutic Activity: 8-22 mins   PT G Codes:        Axton Cihlar A Amandeep Nesmith 06/04/2015, 12:19 PM Wray Kearns, [place], DPT [phone]

## 2015-06-04 NOTE — Progress Notes (Signed)
Subjective: No further seizures and tolerating Keppra well.   Exam: Filed Vitals:   06/04/15 0542  BP: 100/54  Pulse: 75  Temp: 98 F (36.7 C)  Resp: 18    HEENT-  Normocephalic, no lesions, without obvious abnormality.  Normal external eye and conjunctiva.  Normal TM's bilaterally.  Normal auditory canals and external ears. Normal external nose, mucus membranes and septum.  Normal pharynx. Cardiovascular- S1, S2 normal, pulses palpable throughout   Lungs- no tachypnea, retractions or cyanosis, Heart exam - S1, S2 normal, no murmur, no gallop, rate regular Abdomen- normal findings: bowel sounds normal Extremities- no edema     Gen: In bed, NAD MS: alert and oriented, speech clear,  CN: right facial droop, PERRLA, EOMI, TML Motor: Right hemiplegia. Moving left UE and LE antigravity Sensory: decreased sensation on the lright DTR: 2+ throughout  Pertinent Labs: No recent labs  Etta Quill PA-C [facility] [phone]  Impression: Patient without further seizure activity.    Recommendations: Continue Keppra at current dose. Neurology S/O    06/04/2015, 9:18 AM

## 2015-06-05 ENCOUNTER — Inpatient Hospital Stay (HOSPITAL_COMMUNITY): Payer: BLUE CROSS/BLUE SHIELD | Admitting: Occupational Therapy

## 2015-06-05 ENCOUNTER — Inpatient Hospital Stay (HOSPITAL_COMMUNITY): Payer: BLUE CROSS/BLUE SHIELD | Admitting: Physical Therapy

## 2015-06-05 DIAGNOSIS — G8190 Hemiplegia, unspecified affecting unspecified side: Secondary | ICD-10-CM

## 2015-06-05 MED ORDER — HYDROCODONE-ACETAMINOPHEN 5-325 MG PO TABS
1.0000 | ORAL_TABLET | ORAL | Status: DC | PRN
  Administered 2015-06-07: 1 via ORAL
  Filled 2015-06-05 (×2): qty 1

## 2015-06-05 NOTE — Progress Notes (Signed)
Patient information reviewed and updated in eRehab system by Daiva Nakayama, RN, [place], [place] Coordinator.  Information including medical coding and functional independence measure will be reviewed and updated through discharge.  [employer] considering this a new admission but in eRehab patient is considered an interrrupted stay (off IRF less than 3 days).  Please reference csn [id number] as a part of this record.

## 2015-06-05 NOTE — Evaluation (Signed)
Speech Language Pathology Assessment and Plan  Patient Details  Name: Ashley [name] MRN: [id number] Date of Birth: [birth year]  SLP Diagnosis: Aphasia;Cognitive Impairments;Dysphagia  Rehab Potential: Good ELOS: 14-21 days     Today's Date: 06/05/2015 SLP Individual Time: 0800-0900 SLP Individual Time Calculation (min): 60 min   Problem List:  Patient Active Problem List   Diagnosis Date Noted  . Left-sided intracerebral hemorrhage 06/04/2015  . Seizure disorder as sequela of cerebrovascular accident   . Seizure disorder 06/03/2015  . Sepsis 06/03/2015  . UTI (urinary tract infection) 06/03/2015  . Hypotension 06/02/2015  . Right spastic hemiparesis 05/28/2015  . 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 History of anxiety disorder 05/28/2015  . ICH (intracerebral hemorrhage)   . Seizure disorder, nonconvulsive, with status epilepticus   . Cerebral venous thrombosis of cortical vein   . Cytotoxic cerebral edema   . IVH (intraventricular hemorrhage)   . Term pregnancy 05/07/2015  . Spontaneous vaginal delivery 05/07/2015   Past Medical History:  Past Medical History  Diagnosis Date  . Anxiety   . ICH (intracerebral hemorrhage)    Past Surgical History:  Past Surgical History  Procedure Laterality Date  . Dilation and curettage of uterus      Assessment / Plan / Recommendation Clinical Impression  Ashley [name] is a 39 y.o. Postpartum female admitted on 05/20/15 with complaints of HA since delivery She was admitted on 05/21/15 with right facial droop with aphasia, right sided weakness and seizures. She was found to have acute large left fronto-parietal IPH/SAH and was started on Keppra for treatment.  Patient with resultant dense left hemiplegia, aphasia as well as dysphagia and was admitted to CIR on 05/28/15 for rehab.  On 06/02/15 am, patient developed seizures with obtundation. She was noted to be severely hypotensive and was treated with  fluid bolus as well as IV Keppra and IV antibiotics due to concerns of sepsis. She was transferred to stepdown unit for closer monitoring. Follow up CT head showed resolving left frontoparietal hemorrhage. Patient has been seizure free and has had improvement in use of RUE as well as verbal output. Therapy was re-initiated and patient re-admitted back to CIR on 06/04/2015 complete her rehab course. SLP re-evaluation completed on 06/05/2015 with the following results:  Pt presents with significant improvements in cognitive-linguistic function in comparison to last known functional status prior to transfer to stepdown unit.  Pt presents with a mild-moderate expressive>receptive aphasia which is further compounded by verbal and oral apraxia.  Pt had difficulty following abstract, multi-step directives but was able to follow 2 step commands and was 100% accurate for answering basic and semi-complex yes/no questions.  Pt was also noted with improved spontaneity and fluency of verbal expression at the phrase level with verbal errors most consistently characterized by phonemic paraphasic errors.  Pt was often aware of errors and was able to correct them in ~75% of instances.  Additionally, pt presents with grossly intact swallowing function for toleration of regular solids and thin liquids.  No overt s/s of aspiration were evident with solids or liquids and pt exhibited complete clearance of residual solids from the oral cavity post swallow with self initiated use of liquid wash.  Recommend that pt continue on regular solids and thin liquids and pt would benefit from full supervision for tray set up and use of swallowing precautions.  Pt may consume thin liquids with intermittent supervision/set up.    Skilled Therapeutic Interventions  Cognitive-linguistic and bedside swallowing evaluation completed with results and recommendations reviewed with patient.     SLP Assessment  Patient will need skilled  Speech Lanaguage Pathology Services during CIR admission    Recommendations  SLP Diet Recommendations: Age appropriate regular solids;Thin Liquid Administration via: Cup;Straw Medication Administration: Whole meds with liquid Supervision: Patient able to self feed;Full supervision/cueing for compensatory strategies Compensations: Slow rate;Small sips/bites;Check for pocketing Postural Changes and/or Swallow Maneuvers: Seated upright 90 degrees;Out of bed for meals Oral Care Recommendations: Oral care BID Patient destination: Home Follow up Recommendations: 24 hour supervision/assistance;Home Health SLP;Outpatient SLP Equipment Recommended: None recommended by SLP    SLP Frequency 3 to 5 out of 7 days   SLP Treatment/Interventions Cognitive remediation/compensation;Cueing hierarchy;Dysphagia/aspiration precaution training;Internal/external aids;Environmental controls;Functional tasks;Speech/Language facilitation;Patient/family education;Multimodal communication approach   Pain Pain Assessment Pain Assessment: No/denies pain Pain Score: 5  Pain Type: Acute pain Pain Location: Head Pain Descriptors / Indicators: Headache Pain Frequency: Constant Pain Onset: On-going Patients Stated Pain Goal: 3 Pain Intervention(s): RN made aware Multiple Pain Sites: No Prior Functioning Cognitive/Linguistic Baseline: Within functional limits Type of Home: House  Lives With: Spouse Available Help at Discharge: Family;Available 24 hours/day Vocation: Part time employment  Function:  Eating Eating   Modified Consistency Diet: No Eating Assist Level: Set up assist for;Supervision or verbal cues   Eating Set Up Assist For: Opening containers;Cutting food       Cognition Comprehension Comprehension assist level: Follows basic conversation/direction with extra time/assistive device  Expression   Expression assist level: Expresses basic 90% of the time/requires cueing < 10% of the time.   Social Interaction Social Interaction assist level: Interacts appropriately 90% of the time - Needs monitoring or encouragement for participation or interaction.  Problem Solving Problem solving assist level: Solves basic 50 - 74% of the time/requires cueing 25 - 49% of the time  Memory Memory assist level: Recognizes or recalls 50 - 74% of the time/requires cueing 25 - 49% of the time   Short Term Goals: Week 1: SLP Short Term Goal 1 (Week 1): Pt will consume her currently prescribed diet with mod I use of swallowing precautions.   SLP Short Term Goal 2 (Week 1): Pt will monitor and correct verbal errors during structured and unstructured tasks with min assist verbal cues.   SLP Short Term Goal 3 (Week 1): Pt will follow abstract, multi-step commands during functional tasks with min assist verbal cues in 75% of observable opportunities.  SLP Short Term Goal 4 (Week 1): Pt will return demonstration of at least 2 safety precautions during functional tasks with min assist verbal cues.  Refer to Care Plan for Long Term Goals  Recommendations for other services: None  Discharge Criteria: Patient will be discharged from SLP if patient refuses treatment 3 consecutive times without medical reason, if treatment goals not met, if there is a change in medical status, if patient makes no progress towards goals or if patient is discharged from hospital.  The above assessment, treatment plan, treatment alternatives and goals were discussed and mutually agreed upon: by patient  Emilio Math 06/05/2015, 12:33 PM

## 2015-06-05 NOTE — Evaluation (Signed)
Physical Therapy Re-Assessment and Plan  Patient Details  Name: Ashley [name] MRN: [id number] Date of Birth: 09/11/1976  Rehab Potential: Good ELOS: 3 weeks   Today's Date: 06/05/2015 PT Individual Time: 1300-1430 PT Individual Time Calculation (min): 90 min    Assessment & Plan Clinical Impression:  Patient is a 39 y.o. postpartum female originally admitted on 05/20/15 with complaints of HA since delivery On 06/02/15 am, patient developed seizures with obtundation. She was noted to be severely hypotensive and was treated with fluid bolus as well as IV Keppra and IV antibiotics due to concerns of sepsis. She was transferred to stepdown unit for closer monitoring.   Follow up CT head showed resolving left frontoparietal hemorrhage. Keppra was increased to 1500 mg bid and patient was back to baseline later than am. Dr. Reynolds recommended treating infection and recommended use of ativan alone for treatment of anxiety/insomnia. Patient has been seizure free and has had improvement in use of RUE as well as verbal output. Therapy was re-initiated this am and patient re-admitted back to complete her rehab course.   Patient returns to CIR on 06/04/2015 . Patient transferred to CIR on 06/04/2015 .   Patient currently requires total with mobility secondary to muscle weakness and muscle joint tightness, decreased cardiorespiratoy endurance, impaired timing and sequencing, abnormal tone, unbalanced muscle activation, motor apraxia, decreased coordination and decreased motor planning, decreased midline orientation, decreased attention to right and decreased motor planning, decreased awareness, decreased problem solving, decreased safety awareness and decreased memory and decreased sitting balance, decreased standing balance, decreased postural control, hemiplegia and decreased balance strategies.  Prior to hospitalization, patient was independent  with mobility and lived with Spouse in a House home.   Home access is 3-4 in front and 2 via garageStairs to enter.  Patient will benefit from skilled PT intervention to maximize safe functional mobility, minimize fall risk and decrease caregiver burden for planned discharge home with 24 hour assist.  Anticipate patient will benefit from follow up HH at discharge.  PT - End of Session Activity Tolerance: Tolerates 10 - 20 min activity with multiple rests Endurance Deficit: Yes Endurance Deficit Description: cardiorespiratory PT Assessment Rehab Potential (ACUTE/IP ONLY): Good Barriers to Discharge: Inaccessible home environment PT Patient demonstrates impairments in the following area(s): Balance;Behavior;Edema;Endurance;Motor;Pain;Perception;Safety;Sensory PT Transfers Functional Problem(s): Bed Mobility;Bed to Chair;Car;Furniture;Floor PT Locomotion Functional Problem(s): Ambulation;Wheelchair Mobility;Stairs PT Plan PT Intensity: Minimum of 1-2 x/day ,45 to 90 minutes PT Frequency: 5 out of 7 days PT Duration Estimated Length of Stay: 3 weeks PT Treatment/Interventions: Ambulation/gait training;Discharge planning;Functional mobility training;Psychosocial support;Therapeutic Activities;Balance/vestibular training;Disease management/prevention;Neuromuscular re-education;Therapeutic Exercise;Wheelchair propulsion/positioning;Cognitive remediation/compensation;DME/adaptive equipment instruction;Pain management;Splinting/orthotics;UE/LE Strength taining/ROM;Community reintegration;Functional electrical stimulation;Patient/family education;Stair training;UE/LE Coordination activities PT Transfers Anticipated Outcome(s): supervision PT Locomotion Anticipated Outcome(s): supervision limited household distances; mod I w/c propulsion PT Recommendation Follow Up Recommendations: 24 hour supervision/assistance;Home health PT Patient destination: Home Equipment Recommended: To be determined  Skilled Therapeutic Intervention PT Reevaluation - See below  for details. Pt known to this therapist from recent admission on IPR, but demonstrates markedly improved communication, decreased pushing syndrome, more motor return in R UE/LE, and improved cognitive deficits, although still limited in each of these domains. Therapeutic Activity - see below for details. Mod A to roll L, min A to roll R, otherwise min A side lie to sit either side and in scooting at EOB. Pt req mod A to transfer w/c to/from car. W/C Management - see below for details - intermittent min A with close SBA - poor coordination of L   UE/LE. Pt req assist for w/c parts management (hand over hand for R arm to lock/unlock brakes). Neuromuscular Reeducation - PT instructs pt in self administered motor recovery exercises to be done in w/c (with or without legrests) - LAQ/seated knee flexion with washcloth under foot to reduce friction from floor, seated march, isometric hip adduction 5 seconds, isometric hip abduction 5 seconds: x 5 reps each. Gait Training - see below for details. Pt first ambulates with L wall rail, then progresses to using EVA walker - req max A from PT - poor sequencing - +2 w/c follow.   Pt is likely to benefit from IPR PT. Continue per PT POC.    PT Evaluation Precautions/Restrictions Precautions Precautions: Fall Precaution Comments: R flexor synergy in LE during gait; motor apraxia; R hemiplegia; R inattention Restrictions Weight Bearing Restrictions: No General Chart Reviewed: Yes Family/Caregiver Present: Yes (mom)  Pain Pain Assessment Pain Assessment: 0-10 Pain Score: 6  Pain Type: Acute pain Pain Location: Head Pain Descriptors / Indicators: Headache Pain Frequency: Intermittent Pain Onset: On-going Patients Stated Pain Goal: 2 Pain Intervention(s): Medication (See eMAR) Multiple Pain Sites: No Home Living/Prior Functioning Home Living Available Help at Discharge: Family;Available 24 hours/day Type of Home: House Home Access: Stairs to enter [employer] of Steps: 3-4 in front and 2 via garage Entrance Stairs-Rails: None Home Layout: Two level;Bed/bath upstairs Alternate Level Stairs-Number of Steps: full flight  Alternate Level Stairs-Rails: Right;Left Bathroom Shower/Tub: Tub/shower unit;Curtain;Other (comment) Bathroom Toilet: Standard Bathroom Accessibility: Yes Additional Comments: lives with spouse, infant (newborn) and 2 children ([employer] school-aged); Parents and inlaws retired & can assist; Also sister-in-law.    Lives With: Spouse Prior Function Level of Independence: Independent with gait;Independent with transfers  Able to Take Stairs?: Yes Driving: Yes Vocation: Part time employment Leisure: Hobbies-yes (Comment) Comments: Medical sales; running Cognition Overall Cognitive Status: Impaired/Different from baseline Arousal/Alertness: Lethargic Orientation Level: Oriented to person;Oriented to place;Oriented to time;Oriented to situation Attention: Focused;Sustained Focused Attention: Appears intact Sustained Attention: Appears intact Memory: Impaired Memory Impairment: Decreased recall of new information Awareness: Impaired Awareness Impairment: Emergent impairment Problem Solving: Impaired Problem Solving Impairment: Functional basic Executive Function: Sequencing;Organizing Reasoning: Appears intact Sequencing: Impaired Sequencing Impairment: Functional basic Organizing: Impaired Organizing Impairment: Functional basic Decision Making Impairment: Functional basic Initiating: Impaired Initiating Impairment: Functional basic Self Monitoring Impairment: Verbal basic;Functional basic Self Correcting: Impaired Self Correcting Impairment: Verbal basic;Functional basic Behaviors: Impulsive Safety/Judgment: Impaired Comments: recommend quick release belt for safety  Sensation Sensation Light Touch: Impaired Detail Light Touch Impaired Details: Impaired RUE;Impaired RLE Stereognosis: Not  tested Stereognosis Impaired Details: Impaired RUE;Impaired RLE Hot/Cold: Not tested Proprioception: Impaired Detail Proprioception Impaired Details: Impaired RUE;Impaired RLE Coordination Gross Motor Movements are Fluid and Coordinated: No Fine Motor Movements are Fluid and Coordinated: Not tested Coordination and Movement Description: R hemiplegia; flexor synergy emerges with attempts to use R arm/leg Motor  Motor Motor: Hemiplegia;Motor apraxia;Abnormal tone;Abnormal postural alignment and control  Mobility Transfers Sit to Stand: 3: Mod assist Sit to Stand Details: Verbal cues for technique;Manual facilitation for weight bearing;Manual facilitation for placement;Verbal cues for precautions/safety Stand to Sit: 4: Min assist Stand to Sit Details (indicate cue type and reason): Verbal cues for precautions/safety;Verbal cues for technique;Manual facilitation for placement;Manual facilitation for weight shifting Trunk/Postural Assessment  Cervical Assessment Cervical Assessment: Within Functional Limits Cervical AROM Overall Cervical AROM: Within functional limits for tasks performed Thoracic Assessment Thoracic Assessment: Within Functional Limits Lumbar Assessment Lumbar Assessment: Within Functional Limits Postural Control Postural Control: Deficits on evaluation  Trunk Control: Pt pushes slightly to R in stand Righting Reactions: delayed Postural Limitations: slouched positioning with low tone/hemiplegic R side of trunk  Balance   Extremity Assessment  RUE Assessment RUE Assessment: Exceptions to [facility] RUE AROM (degrees) Overall AROM Right Upper Extremity: Deficits RUE Overall AROM Comments: no shoulder flexion; minimal elbow flexion gravity minimized; trace elbow extension; wrist flexion/extension limited 50%; fingers wfl RUE PROM (degrees) Overall PROM Right Upper Extremity: Deficits;Due to precautions RUE Overall PROM Comments: shoulder flexion limited 25%;  elbow/wrist/fingers wfl RUE Strength RUE Overall Strength: Deficits RUE Overall Strength Comments: arm flexion 0/5, elbow flexion 2-/5, elbow extension 1/5; wrist flexion/extension 2+/5; grip 3+/5 RUE Tone RUE Tone: Modified Ashworth Modified Ashworth Scale for Grading Hypertonia RUE: More marked increase in muscle tone through most of the ROM, but affected part(s) easily moved RUE Tone Comments: elbow flexors 2/4 modified ashworth LUE Assessment LUE Assessment: Within Functional Limits RLE Assessment RLE Assessment: Exceptions to [facility] RLE AROM (degrees) Overall AROM Right Lower Extremity: Deficits;Due to decreased strength RLE Overall AROM Comments: hip flexion minimal; knee extension/flexion inconsistent due to motor apraxia; ankle DF only able to be facilitated with max resistance hip flexion RLE PROM (degrees) Overall PROM Right Lower Extremity: Within functional limits for tasks assessed RLE Strength RLE Overall Strength: Deficits RLE Overall Strength Comments: hip flexion trace to 2+/5; knee extension inconsistently 2+/5; knee flexion inconsistently 2-/5; ankle DF only elicited with max resistance hip flexion; hip adduction 2-/5; hip ER trace RLE Tone RLE Tone: Modified Ashworth Modified Ashworth Scale for Grading Hypertonia RLE: Considerable increase in muschle tone, passive movement difficult (3/4 in hamstrings during gait) LLE Assessment LLE Assessment: Within Functional Limits  Function: Toileting Toileting Toileting activity did not occur: Safety/medical concerns Toileting steps completed by patient: Performs perineal hygiene;Adjust clothing prior to toileting Toileting steps completed by helper: Adjust clothing prior to toileting;Performs perineal hygiene Toileting Assistive Devices: Grab bar or rail (stedy) Assist level: Touching or steadying assistance (Pt.75%)   Bed Mobility Roll left and right activity   Assist level: Mod assist (Pt 50 - 74%, lift 2 legs) (to the  L)    Sit to lying activity   Assist level: Touching or steadying assistance (Pt > 75%, lift 1 leg)    Lying to sitting activity   Assist level: Touching or steadying assistance (Pt > 75%, lift 1 leg)    Mobility details Bed mobility details: Manual facilitation for placement;Verbal cues for techniques;Verbal cues for sequencing;Tactile cues for sequencing   Transfers Sit to stand transfer   Sit to stand assist level: Moderate assist (Pt 50 - 74%/lift 2 legs/lift or lower) Sit to stand assistive device: Armrests;Walker (eva walker)  Chair/bed transfer   Chair/bed transfer method: Stand pivot Chair/bed transfer assist level: Moderate assist (Pt 50 - 74%/lift or lower) Chair/bed transfer assistive device: Armrests   Chair/bed transfer details: Manual facilitation for weight shifting;Manual facilitation for placement;Verbal cues for technique;Visual cues/gestures for sequencing   Engineer, site transfer assist level: Moderate assist (Pt 50 - 74%/lift or lower, lift 2 legs)    Locomotion Ambulation   Assistive device: Rail in hallway;Walker-Eva Max distance: 76' with each AD Assist level: 2 helpers (max x 1, +2 for w/c follow)  Walk 10 feet activity   Assist level: 2 helpers  Walk 50 feet with 2 turns activity Walk 50 feet with 2 turns activity did  not occur: Safety/medical concerns    Walk 150 feet activity Walk 150 feet activity did not occur: Safety/medical concerns    Walk 10 feet on uneven surfaces activity Walk 10 feet on uneven surfaces activity did not occur: Safety/medical concerns    Stairs Stairs activity did not occur: Safety/medical concerns        Walk up/down 1 step activity Walk up/down 1 step or curb (drop down) activity did not occur: Safety/medical concerns Stairs activity did not occur: Safety/medical concerns    Walk up/down 4 steps activity Walk up/down 4 steps activity did not occur: Safety/medical concerns     Walk up/down 12 steps activity Walk up/down 12 steps activity did not occur: Safety/medical concerns    Pick up small objects from floor Pick up small object from the floor (from standing position) activity did not occur: Safety/medical concerns    Wheelchair   Type: Manual Max wheelchair distance: 150 Assist Level: Touching or steadying assistance (Pt > 75%)  Wheel 50 feet with 2 turns activity   Assist Level: Touching or steadying assistance (Pt > 75%)  Wheel 150 feet activity   Assist Level: Touching or steadying assistance (Pt > 75%)   Cognition Comprehension Comprehension assist level: Follows basic conversation/direction with extra time/assistive device  Expression Expression assist level: Expresses basic 90% of the time/requires cueing < 10% of the time.  Social Interaction Social Interaction assist level: Interacts appropriately 90% of the time - Needs monitoring or encouragement for participation or interaction.  Problem Solving Problem solving assist level: Solves basic 50 - 74% of the time/requires cueing 25 - 49% of the time  Memory Memory assist level: Recognizes or recalls 50 - 74% of the time/requires cueing 25 - 49% of the time    Refer to Care Plan for Long Term Goals  Recommendations for other services: None  Discharge Criteria: Patient will be discharged from PT if patient refuses treatment 3 consecutive times without medical reason, if treatment goals not met, if there is a change in medical status, if patient makes no progress towards goals or if patient is discharged from hospital.  The above assessment, treatment plan, treatment alternatives and goals were discussed and mutually agreed upon: by patient  , M 06/05/2015, 4:21 PM  

## 2015-06-05 NOTE — Evaluation (Signed)
Occupational Therapy Assessment and Plan  Patient Details  Name: Ashley [name] MRN: [id number] Date of Birth: [birth year]  OT Diagnosis: apraxia, cognitive deficits, hemiplegia affecting dominant side and muscle weakness (generalized) Rehab Potential: Rehab Potential (ACUTE ONLY): Good ELOS: 3 weeks   Today's Date: 06/05/2015 OT Individual Time: 0900-1000 OT Individual Time Calculation (min): 60 min      Re-Assessment & Plan Clinical Impression: Patient is a 39 y.o. postpartum female originally admitted on 05/20/15 with complaints of HA since delivery On 06/02/15 am, patient developed seizures with obtundation. She was noted to be severely hypotensive and was treated with fluid bolus as well as IV Keppra and IV antibiotics due to concerns of sepsis. She was transferred to stepdown unit for closer monitoring.   Follow up CT head showed resolving left frontoparietal hemorrhage. Keppra was increased to 1500 mg bid and patient was back to baseline later than am. Dr. Doy Mince recommended treating infection and recommended use of ativan alone for treatment of anxiety/insomnia. Patient has been seizure free and has had improvement in use of RUE as well as verbal output. Therapy was re-initiated this am and patient re-admitted back to complete her rehab course.    Patient returns to CIR on 06/04/2015 .    Patient currently requires moderate assist with basic self-care skills secondary to abnormal tone, unbalanced muscle activation, motor apraxia and decreased motor planning and decreased problem solving, decreased memory and delayed processing, right inattention, decreased dynamic sitting balance, decreased static/dynamic standing balance .   Patient will benefit from skilled intervention to increase independence with basic self-care skills prior to discharge home with care partner.  Anticipate patient will require minimal physical assistance and follow up home health.  OT - End of  Session Activity Tolerance: Tolerates 30+ min activity with multiple rests Endurance Deficit: Yes Endurance Deficit Description: fatigued during session OT Assessment Rehab Potential (ACUTE ONLY): Good OT Patient demonstrates impairments in the following area(s): Balance;Cognition;Endurance;Motor;Safety;Perception;Sensory;Pain OT Basic ADL's Functional Problem(s): Eating;Grooming;Bathing;Dressing;Toileting OT Transfers Functional Problem(s): Toilet;Tub/Shower OT Additional Impairment(s): Fuctional Use of Upper Extremity OT Plan OT Intensity: Minimum of 1-2 x/day, 45 to 90 minutes OT Frequency: 5 out of 7 days OT Duration/Estimated Length of Stay: 3 weeks OT Treatment/Interventions: Balance/vestibular training;Cognitive remediation/compensation;Discharge planning;Disease mangement/prevention;DME/adaptive equipment instruction;Functional mobility training;Functional electrical stimulation;Neuromuscular re-education;Pain management;Patient/family education;Psychosocial support;Self Care/advanced ADL retraining;Splinting/orthotics;Therapeutic Activities;Therapeutic Exercise;Visual/perceptual remediation/compensation;Wheelchair propulsion/positioning;UE/LE Coordination activities;UE/LE Strength taining/ROM OT Self Feeding Anticipated Outcome(s): Supervision OT Basic Self-Care Anticipated Outcome(s): Min Assist OT Toileting Anticipated Outcome(s): Min Assist OT Bathroom Transfers Anticipated Outcome(s): Min Assist OT Recommendation Recommendations for Other Services: Neuropsych consult Patient destination: Home Follow Up Recommendations: Home health OT;24 hour supervision/assistance Equipment Recommended: To be determined   Skilled Therapeutic Intervention OT 1:1 re-evaluation with +2 helper for safety to focus on re-ed on safe and effective transfers, adapted bathing/dressing skills, safety awareness, and right attention.   Pt able to complete bathing at shower level with +2 helper to stabilize  during dynamic standing task.   Pt requires mod vc to attend to right and to incorporate use of R-UE during functional tasks.  OT Evaluation Precautions/Restrictions  Precautions Precautions: Fall Precaution Comments: Flaccid R-U/LE with noted mild intermittent hypertonicity at right shoulder/elbow Restrictions Weight Bearing Restrictions: No  General Chart Reviewed: Yes Family/Caregiver Present: No  Vital Signs Therapy Vitals Temp: 98.4 F (36.9 C) Temp Source: Oral Pulse Rate: 82 Resp: 20 BP: 105/67 mmHg Patient Position (if appropriate): Lying Oxygen Therapy SpO2: 99 % O2 Device: Not Delivered  Pain Pain Assessment Pain Assessment: 0-10  Pain Score: 4  Pain Type: Acute pain Pain Location: Generalized Pain Descriptors / Indicators: Headache Pain Frequency: Constant Pain Onset: Progressive Patients Stated Pain Goal: 3 Pain Intervention(s): Medication (See eMAR) Multiple Pain Sites: No  Home Living/Prior Functioning Home Living Available Help at Discharge: Family, Available 24 hours/day Type of Home: House Home Access: Stairs to enter [employer] of Steps: 3-4 in front and 2 via garage Entrance Stairs-Rails: None Home Layout: Two level, Bed/bath upstairs Alternate Level Stairs-Number of Steps: full flight  Alternate Level Stairs-Rails: Right, Left Bathroom Shower/Tub: Tub/shower unit, Curtain, Other (comment) Bathroom Toilet: Standard Bathroom Accessibility: Yes Additional Comments: lives with spouse, infant (newborn) and 2 children ([employer] school-aged); Parents and inlaws retired & can assist; Also sister-in-law.    Lives With: Spouse IADL History Homemaking Responsibilities: Yes Occupation: Part time employment Prior Function Level of Independence: Independent with basic ADLs, Independent with gait, Independent with transfers, Independent with homemaking with ambulation  Able to Take Stairs?: Yes Driving: Yes Vocation: Part time  employment Leisure: Hobbies-yes (Comment) Comments: Medical sales; running  ADL ADL ADL Comments: see Functional Assessment  Vision/Perception  Vision- History Baseline Vision/History: No visual deficits Patient Visual Report: No change from baseline Vision- Assessment Vision Assessment?: No apparent visual deficits   Cognition Overall Cognitive Status: Impaired/Different from baseline Arousal/Alertness: Lethargic Memory: Impaired (suspect impaired ) Memory Impairment: Storage deficit;Retrieval deficit;Decreased recall of new information Attention: Sustained Sustained Attention: Appears intact Awareness: Impaired Awareness Impairment: Emergent impairment Problem Solving: Impaired Problem Solving Impairment: Verbal basic;Functional basic Executive Function: Self Monitoring;Self Correcting Reasoning: Appears intact Sequencing: Impaired Sequencing Impairment: Functional basic Organizing: Impaired Organizing Impairment: Functional basic Decision Making Impairment: Functional basic Initiating: Impaired Initiating Impairment: Functional basic Self Monitoring: Impaired Self Monitoring Impairment: Verbal basic;Functional basic Self Correcting: Impaired Self Correcting Impairment: Verbal basic;Functional basic Safety/Judgment: Impaired Comments: recommend quick release belt for safety   Sensation Sensation Light Touch: Impaired Detail Light Touch Impaired Details: Impaired RLE;Absent RUE Stereognosis: Impaired Detail Stereognosis Impaired Details: Impaired RUE;Impaired RLE Hot/Cold: Appears Intact Proprioception: Impaired Detail Proprioception Impaired Details: Impaired RUE;Impaired RLE Coordination Gross Motor Movements are Fluid and Coordinated: No (Right hemiparesis) Fine Motor Movements are Fluid and Coordinated: No (Right hemiparesis)  Motor  Motor Motor: Hemiplegia;Motor apraxia;Abnormal tone;Abnormal postural alignment and control  Mobility   Transfers Transfers: Sit to Stand;Stand to Sit Sit to Stand: 3: Mod assist Sit to Stand Details: Verbal cues for technique;Manual facilitation for weight bearing;Manual facilitation for placement;Verbal cues for precautions/safety Stand to Sit: 4: Min assist Stand to Sit Details (indicate cue type and reason): Verbal cues for precautions/safety;Verbal cues for technique;Manual facilitation for placement;Manual facilitation for weight shifting   Trunk/Postural Assessment  Cervical Assessment Cervical Assessment: Within Functional Limits Cervical AROM Overall Cervical AROM: Within functional limits for tasks performed Thoracic Assessment Thoracic Assessment: Within Functional Limits Lumbar Assessment Lumbar Assessment: Within Functional Limits Postural Control Postural Control: Deficits on evaluation Trunk Control: Pt pushes slightly to R in stand Righting Reactions: delayed Postural Limitations: slouched positioning with low tone/hemiplegic R side of trunk   Balance     Extremity/Trunk Assessment RUE Assessment RUE Assessment: Exceptions to [facility] RUE AROM (degrees) Overall AROM Right Upper Extremity: Deficits RUE Overall AROM Comments: no shoulder flexion; minimal elbow flexion gravity minimized; trace elbow extension; wrist flexion/extension limited 50%; fingers wfl RUE PROM (degrees) Overall PROM Right Upper Extremity: Deficits;Due to precautions RUE Overall PROM Comments: shoulder flexion limited 25%; elbow/wrist/fingers wfl RUE Strength RUE Overall Strength: Deficits RUE Overall Strength Comments: arm flexion 0/5, elbow flexion  2-/5, elbow extension 1/5; wrist flexion/extension 2+/5; grip 3+/5 RUE Tone RUE Tone: Modified Ashworth Modified Ashworth Scale for Grading Hypertonia RUE: More marked increase in muscle tone through most of the ROM, but affected part(s) easily moved RUE Tone Comments: elbow flexors 2/4 modified ashworth LUE Assessment LUE Assessment: Within  Functional Limits  Function:   Eating Eating   Eating Assist Level: Set up assist for;Supervision or verbal cues   Eating Set Up Assist For: Opening containers;Cutting food       Grooming Oral Care,Brush Teeth, Clean Dentures Activity:    Oral care, brush teeth, clean dentures activity did not occur: N/A        Wash, Rinse, Dry Face Activity   Assist Level: Set up;Supervision or verbal cues   Set up : To obtain items  Wash, Rinse, Dry Hands Activity   Assist Level: Set up;Supervision or verbal cues   Set up : To obtain items  Brush, Comb Hair Activity   Assist Level: Touching or steadying assistance(Pt > 75%)    Shave Activity          Apply Makeup Activity                                                             Bathing Bathing position   Position: Shower  Bathing parts Body parts bathed by patient: Right arm;Chest;Abdomen;Front perineal area;Left upper leg;Left lower leg;Right upper leg Body parts bathed by helper: Buttocks;Left arm;Back  Bathing assist Assist Level: Supervision or verbal cues   Set up : To obtain items;To open containers   Upper Body Dressing/Undressing Upper body dressing   What is the patient wearing?: Pull over shirt/dress     Pull over shirt/dress - Perfomed by patient: Thread/unthread left sleeve;Put head through opening Pull over shirt/dress - Perfomed by helper: Pull shirt over trunk;Thread/unthread right sleeve        Upper body assist Assist Level: Touching or steadying assistance(Pt > 75%)       Lower Body Dressing/Undressing Lower body dressing   What is the patient wearing?: Pants;Non-skid slipper socks     Pants- Performed by patient: Thread/unthread left pants leg;Thread/unthread right pants leg Pants- Performed by helper: Pull pants up/down Non-skid slipper socks- Performed by patient: Don/doff right sock;Don/doff left sock                    Lower body assist Assist Level: Touching or steadying  assistance (Pt > 75%)       Toileting Toileting Toileting steps completed by patient: Performs perineal hygiene Toileting steps completed by helper: Adjust clothing prior to toileting;Adjust clothing after toileting   Toileting assist Assist level: Touching or steadying assistance (Pt.75%)   Bed Mobility Roll left and right activity   Assist level: Mod assist (Pt 50 - 74%, lift 2 legs) (to the L)  Sit to lying activity   Assist level: Touching or steadying assistance (Pt > 75%, lift 1 leg)  Lying to sitting activity   Assist level: Touching or steadying assistance (Pt > 75%, lift 1 leg)  Mobility details Bed mobility details: Manual facilitation for placement;Verbal cues for techniques;Verbal cues for sequencing;Tactile cues for sequencing   Transfers Sit to stand transfer   Sit to stand assist level: Moderate assist (Pt 50 - 74%/lift 2  legs/lift or lower) Sit to stand assistive device: Armrests;Walker (eva walker)  Chair/bed transfer   Chair/bed transfer method: Stand pivot Chair/bed transfer assist level: Moderate assist (Pt 50 - 74%/lift or lower) Chair/bed transfer assistive device: Armrests   Chair/bed transfer details: Manual facilitation for weight shifting;Manual facilitation for placement;Verbal cues for technique;Visual cues/gestures for sequencing  Toilet transfer   Toilet transfer assistive device: Grab bar       Assist level to toilet: Moderate assist (Pt 50 - 74%/lift or lower) Assist level from toilet: Moderate assist (Pt 50 - 74%/lift or lower)  Tub/shower transfer   Tub/shower assistive device: Tub transfer bench;Walk in shower;Grab bars   Assist level into tub: Moderate assist (Pt 50 - 74%/lift or lower/lift 2 legs) Assist level out of tub: Moderate assist (Pt 50 - 74%/lift or lower/lift 2 legs)   Cognition Comprehension Comprehension assist level: Follows basic conversation/direction with extra time/assistive device  Expression Expression assist level:  Expresses basic 90% of the time/requires cueing < 10% of the time.  Social Interaction Social Interaction assist level: Interacts appropriately 90% of the time - Needs monitoring or encouragement for participation or interaction.  Problem Solving Problem solving assist level: Solves basic 50 - 74% of the time/requires cueing 25 - 49% of the time  Memory Memory assist level: Recognizes or recalls 50 - 74% of the time/requires cueing 25 - 49% of the time    Refer to Care Plan for Long Term Goals  Recommendations for other services: None  Discharge Criteria: Patient will be discharged from OT if patient refuses treatment 3 consecutive times without medical reason, if treatment goals not met, if there is a change in medical status, if patient makes no progress towards goals or if patient is discharged from hospital.  The above assessment, treatment plan, treatment alternatives and goals were discussed and mutually agreed upon: by patient  [facility] 06/05/2015, 4:10 PM

## 2015-06-05 NOTE — Progress Notes (Signed)
Ashley Blake, MD Physician Signed Physical Medicine and Rehabilitation Consult Note 05/22/2015 2:47 PM  Related encounter: ED to Hosp-Admission (Discharged) from 05/20/2015 in [place] Collapse All        Physical Medicine and Rehabilitation Consult  Reason for Consult: Right facial droop with aphasia, right sided weakness,  Referring Physician: Dr. Leonie Man.    HPI: Ashley [name] is a 39 y.o. female two weeks post-partum with complaints of HA on and off since delivery. She was admitted via [facility] with severe HA and developed right facial droop with aphasia and right sided weakness after admission. She was transferred to [facility] and developed right focal seizure en route treated with ativan. MRI/MRA brain done revealing acute large LEFT parietal intraparenchymal hematoma dissecting along the corpus callosum with intraventricular extension, scattered SAH with findings suggestive of postpartum angiopathy, no obstructive hydrocephalus and no venous thrombosis. EEG showed evidence of left sided (periodic lateral epileptiform discharges)PLEDs and patient was loaded with Keppra and started on Keppra 750 mg bid. ICP treated with hypertonic saline and induced hypernatremia. Follow up CCT today shows large left fronto-parietal hematoma with progression of vasogenic edema and extensive SAH left hemisphere. Exact etiology of CVA has not been defined. There may be some underlying mass or vascular malformation which may be masked by the large hemorrhage. PT/OT evaluations done today and CIR recommended by MD and rehab team.    Review of Systems  Unable to perform ROS: language      Past Medical History  Diagnosis Date  . Anxiety     Past Surgical History  Procedure Laterality Date  . Dilation and curettage of uterus      Family History  Problem Relation Age of Onset  . Cancer Mother   . Cancer Father      Social History: Married. Has 54 week old and 2 step children at home. Per reports working and active PTA. reports that she has never smoked. She has never used smokeless tobacco. She reports that she drinks alcohol. She reports that she does not use illicit drugs.    Allergies: No Known Allergies    Medications Prior to Admission  Medication Sig Dispense Refill  . acetaminophen (TYLENOL) 500 MG tablet Take 1,000 mg by mouth every 6 (six) hours as needed for headache.    Marland Kitchen acyclovir (ZOVIRAX) 400 MG tablet Take 400 mg by mouth 5 (five) times daily as needed (for breakout).     Marland Kitchen docusate sodium (COLACE) 100 MG capsule Take 100 mg by mouth daily.    Marland Kitchen FLUoxetine (PROZAC) 40 MG capsule Take 40 mg by mouth daily.    Marland Kitchen ibuprofen (ADVIL,MOTRIN) 600 MG tablet Take 1 tablet (600 mg total) by mouth every 6 (six) hours as needed for moderate pain or cramping. 40 tablet 0  . Prenatal Vit-Fe Fumarate-FA (PRENATAL MULTIVITAMIN) TABS tablet Take 1 tablet by mouth daily at 12 noon.      Home: Home Living Family/patient expects to be discharged to:: Inpatient rehab Living Arrangements: Spouse/significant other, Children Available Help at Discharge: Family, Available 24 hours/day Type of Home: House Home Access: Stairs to enter [employer] of Steps: 3-4 in front and one via garage Home Layout: Two level, Bed/bath upstairs Alternate Level Stairs-Number of Steps: full flight  Bathroom Toilet: Standard Home Equipment: None Additional Comments: Pt lives with spouse, and 28 week old son. She shares custody of children ages 19 and 75. 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 She runs frequently  Functional Status:  Mobility: Bed  Mobility Overal bed mobility: Needs Assistance Bed Mobility: Supine to Sit Supine to sit: Max assist General bed mobility comments: assist for moving right side to EOB and lifting trunk upright Transfers Overall transfer level: Needs assistance Equipment used: 2 person hand held assist Transfers: Squat Pivot Transfers Squat pivot transfers: Max assist, +2 physical assistance General transfer comment: increased time, pt able to bear weight for transfer, but needs assist for pivoting hips Ambulation/Gait General Gait Details: not tested due to HA in sitting and pt fearful due to right side weakness    ADL: ADL Overall ADL's : Needs assistance/impaired Eating/Feeding: Maximal assistance, Sitting Grooming: Wash/dry hands, Wash/dry face, Brushing hair, Maximal assistance, Moderate assistance Grooming Details (indicate cue type and reason): Pt able to identify "comb". but unable to demonstrate use initially. With verbal cues and gestures, she was able to demonstrate use, but demonstrated signfiicant difficulty positioning comb appropriately in relation to hair/head requiring hand over hand assist to complete task  Upper Body Bathing: Maximal assistance, Sitting Lower Body Bathing: Total assistance, Sit to/from stand Upper Body Dressing : Total assistance, Sitting Lower Body Dressing: Total assistance, Sit to/from stand, Sitting/lateral leans Toilet Transfer: Maximal assistance, +2 for safety/equipment, Squat-pivot, BSC Toileting- Clothing Manipulation and Hygiene: Total assistance, Sitting/lateral lean Functional mobility during ADLs: Maximal assistance, +2 for physical assistance  Cognition: Cognition Overall Cognitive Status: Difficult to assess Orientation Level: Oriented to person, Disoriented to time, Disoriented to place, Disoriented to situation Cognition Arousal/Alertness: Awake/alert Behavior During Therapy: Flat affect Overall Cognitive Status: Difficult to assess Area of  Impairment: Following commands Following Commands: Follows one step commands inconsistently General Comments: Pt follows one step commands inconsitently. She appeared to recognize infant son, and interacted appropriately with him within confines of physical and communication impairments  Difficult to assess due to: Impaired communication  Blood pressure 133/77, pulse 67, temperature 98.2 F (36.8 C), temperature source Oral, resp. rate 18, height 5\' 3"  (1.6 m), weight 66.6 kg (146 lb 13.2 oz), SpO2 93 %, unknown if currently breastfeeding. Physical Exam  Nursing note and vitals reviewed. Constitutional: She appears well-developed and well-nourished.  HENT:  Head: Normocephalic and atraumatic.  Eyes: Conjunctivae are normal. Pupils are equal, round, and reactive to light.  Neck: Normal range of motion. Neck supple.  Cardiovascular: Normal rate and regular rhythm.  Respiratory: Effort normal and breath sounds normal.  GI: Soft. Bowel sounds are normal. She exhibits no distension. There is no tenderness.  Musculoskeletal: She exhibits no edema.  Neurological: She is alert.  Alert, pleasant and smiling. Verbal output was limited to yes. Global aphasia with apraxic component. She was unable to pick her name or place with choice of two. Unable to point to items in room. Right spastic hemiparesis. RLE noted to have extensor tone as well as myoclonus. Right gaze preference.  Skin: Skin is warm and dry.  Motor strength is 0/5 in the right deltoid, biceps, triceps, grip, hip flexor, knee extensor, ankle dorsi flexor and plantar flexor Left side has at least 4/5 strength, has some difficulty with following commands although she does fairly well with simple one step commands Difficult to assess sensation secondary to aphasia. She does withdraw to pinch in the right upper and right lower  extremity  Lab Results Last 24 Hours    Results for orders placed or performed during the hospital encounter  of 05/20/15 (from the past 24 hour(s))  Sodium Status: Abnormal   Collection Time: 05/21/15 8:50 PM  Result Value Ref Range   Sodium 149 (H) 135 - 145 mmol/L  Sodium Status: Abnormal   Collection Time: 05/22/15 3:16 AM  Result Value Ref Range   Sodium 150 (H) 135 - 145 mmol/L  Sodium Status: Abnormal   Collection Time: 05/22/15 8:40 AM  Result Value Ref Range   Sodium 148 (H) 135 - 145 mmol/L      Imaging Results (Last 48 hours)    Ct Head Wo Contrast  05/22/2015 CLINICAL DATA: 39 year old female with intracranial hemorrhage. Follow-up. Subsequent encounter. EXAM: CT HEAD WITHOUT CONTRAST TECHNIQUE: Contiguous axial images were obtained from the base of the skull through the vertex without intravenous contrast. COMPARISON: 05/21/2015 MR and 05/20/2015 CT. FINDINGS: Large left posterior frontal -parietal lobe hematoma with extension into the corpus callosum has overall size similar to the prior exam. Interval progression of surrounding vasogenic edema. Mass effect upon the lateral ventricles greater on the left with downward compression better appreciated on recent MR which included coronal imaging. Minimal bowing of the septum towards the right. Extensive subarachnoid hemorrhage greatest left hemisphere most prominent left sylvian fissure and left convexity minimal changed from prior exam. Tiny amount of deep dependent interventricular blood similar to the MR. Cause of intracranial hemorrhage is indeterminate. No CT evidence of large acute thrombotic infarct. No calvarial abnormality. Mastoid air cells, middle ear cavities and visualized paranasal sinuses are clear. IMPRESSION: Large left posterior frontal -parietal lobe hematoma with extension into the corpus callosum has overall size similar to the prior exam. Interval progression of surrounding vasogenic edema. Mass effect upon the lateral ventricles greater on the left with downward  compression better appreciated on recent MR which included coronal imaging. Minimal bowing of the septum towards the right. Extensive subarachnoid hemorrhage greatest left hemisphere most prominent left sylvian fissure and left convexity minimal changed from prior exam. Tiny amount of deep dependent interventricular blood similar to the MR. Cause of intracranial hemorrhage is indeterminate. Electronically Signed By: Genia Del M.D. On: 05/22/2015 07:26   Ct Head Wo Contrast  05/21/2015 CLINICAL DATA: Severe headaches 2 weeks postpartum EXAM: CT HEAD WITHOUT CONTRAST TECHNIQUE: Contiguous axial images were obtained from the base of the skull through the vertex without intravenous contrast. COMPARISON: None. FINDINGS: The ventricles are normal in size and configuration. There is a hemorrhage which appears to arise from the left parietal lobe measuring 6.2 x 3.1 cm. This hemorrhage tracks anteriorly and inferiorly throughout the splenium of the corpus callosum involving most of the splenium of the corpus callosum. Hemorrhage also tracks just superior to the left lateral ventricle. There is subarachnoid hemorrhage throughout much of the brain parenchyma in the supratentorial region on the left with a small amount of subarachnoid hemorrhage crossing to the right slightly anterior to the rostrum of the corpus callosum. There is no subdural or epidural fluid collection. There is no midline shift. Elsewhere gray-white compartments appear normal. The bony calvarium appears intact. The mastoid air cells are clear. IMPRESSION: Extensive intraparenchymal hemorrhage which arises from the left parietal lobe and extends into the splenium of the corpus callosum and slightly superior to the left lateral ventricle. There is extensive subarachnoid hemorrhage on the left with a small amount of subarachnoid hemorrhage crossing to the right anteriorly. The ventricles are not efface,  and there is no midline shift.  There is no extra-axial fluid. Elsewhere gray-white compartments appear normal. Critical Value/emergent results were called by telephone at the time of interpretation on 05/21/2015 at 12:12 am to Dr. Doy Mince, neurology, who verbally acknowledged these results. Electronically Signed By: Lowella Grip III M.D. On: 05/21/2015 00:12   Mr Jodene Nam Head Wo Contrast  05/21/2015 CLINICAL DATA: Headache today, worse than typical migraine. Acute onset RIGHT-sided weakness. Acute RIGHT focal seizure in emergency department. Two weeks postpartum. Evaluate intracranial hemorrhage. EXAM: MRI HEAD WITHOUT CONTRAST MRA HEAD WITHOUT CONTRAST MRV HEAD WITHOUT CONTRAST TECHNIQUE: Multiplanar, multiecho pulse sequences of the brain and surrounding structures were obtained without and with intravenous contrast. Angiographic images of the head were obtained using MRA and MRV technique without contrast. MIP images provided. COMPARISON: CT head May 20, 2015 at 2354 hours FINDINGS: MRI HEAD FINDINGS Acute mesial LEFT parietal intraparenchymal hematoma better seen on prior CT, dissecting along the body of the corpus callosum to the RIGHT. Intraventricular extension with layering blood products in the LEFT lateral ventricle. No hydrocephalus. Minimal surrounding T2 bright vasogenic edema, local sulcal effacement without midline shift. Mass effect on LEFT lateral ventricle. No satellite areas of abnormal parenchymal signal. Susceptibility artifact along the interhemispheric fissure, LEFT sylvian fissure in LEFT cerebellar tentorium consistent with acute blood products seen on today's CT. Bilateral hippocampi demonstrate normal size, morphology and signal characteristics. Ocular globes and orbital contents are unremarkable. Paranasal sinuses and mastoid air cells are well aerated. No abnormal sellar expansion. No cerebellar tonsillar ectopia. No suspicious calvarial bone marrow signal. MRA HEAD FINDINGS Anterior  circulation: Normal flow related enhancement of the included cervical, petrous, cavernous and supra clinoid internal carotid arteries. Patent anterior communicating artery. Normal flow related enhancement of the anterior and middle cerebral arteries, including more distal segments. RIGHT AC 1 segment is dominant. No large vessel occlusion, high-grade stenosis, abnormal luminal irregularity, aneurysm. Posterior circulation: Codominant vertebral arteries. Basilar artery is patent, with normal flow related enhancement of the main branch vessels. Patent posterior cerebral arteries. Slightly decreased and flow related enhancement of the distal LEFT posterior cerebral artery though, with normal in caliber, likely representing slow flow secondary to local mass effect. No large vessel occlusion, high-grade stenosis, abnormal luminal irregularity, aneurysm. MRV HEAD FINDINGS Normal flow related enhancement within the superior sagittal sinus, torcula of the Herophili, bilateral transverse, sigmoid sinuses and included internal jugular veins. Normal flow related enhancement of the internal cerebral veins. IMPRESSION: MRI HEAD: Acute large LEFT parietal intraparenchymal hematoma dissecting along the corpus callosum, intraventricular extension without obstructive hydrocephalus. Scattered subarachnoid hemorrhage better seen on today's CT head. Findings suggest postpartum angiopathy ; postpartum angiopathy typically affects the medium and small vessels which are not well characterized on MRA of the head; follow-up CTA of the head or digital subtraction angiography could be performed as clinically indicated . Location is also typical for venous infarction though, there is no major dural venous sinus thrombosis. Additional consideration are vascular malformation or hemorrhagic mass. Recommend followup MRI of the head with contrast in 2-4 weeks. MRA HEAD: No acute vascular process ; normal MRA head. MRV HEAD: No dural venous  sinus thrombosis ; normal noncontrast MRV head. Electronically Signed By: Elon Alas M.D. On: 05/21/2015 03:35   Mr Brain Wo Contrast  05/21/2015 CLINICAL DATA: Headache today, worse than typical migraine. Acute onset RIGHT-sided weakness. Acute RIGHT focal seizure in emergency department. Two weeks postpartum. Evaluate intracranial hemorrhage. EXAM: MRI HEAD WITHOUT CONTRAST MRA HEAD WITHOUT CONTRAST MRV HEAD WITHOUT  CONTRAST TECHNIQUE: Multiplanar, multiecho pulse sequences of the brain and surrounding structures were obtained without and with intravenous contrast. Angiographic images of the head were obtained using MRA and MRV technique without contrast. MIP images provided. COMPARISON: CT head May 20, 2015 at 2354 hours FINDINGS: MRI HEAD FINDINGS Acute mesial LEFT parietal intraparenchymal hematoma better seen on prior CT, dissecting along the body of the corpus callosum to the RIGHT. Intraventricular extension with layering blood products in the LEFT lateral ventricle. No hydrocephalus. Minimal surrounding T2 bright vasogenic edema, local sulcal effacement without midline shift. Mass effect on LEFT lateral ventricle. No satellite areas of abnormal parenchymal signal. Susceptibility artifact along the interhemispheric fissure, LEFT sylvian fissure in LEFT cerebellar tentorium consistent with acute blood products seen on today's CT. Bilateral hippocampi demonstrate normal size, morphology and signal characteristics. Ocular globes and orbital contents are unremarkable. Paranasal sinuses and mastoid air cells are well aerated. No abnormal sellar expansion. No cerebellar tonsillar ectopia. No suspicious calvarial bone marrow signal. MRA HEAD FINDINGS Anterior circulation: Normal flow related enhancement of the included cervical, petrous, cavernous and supra clinoid internal carotid arteries. Patent anterior communicating artery. Normal flow related enhancement of the anterior and  middle cerebral arteries, including more distal segments. RIGHT AC 1 segment is dominant. No large vessel occlusion, high-grade stenosis, abnormal luminal irregularity, aneurysm. Posterior circulation: Codominant vertebral arteries. Basilar artery is patent, with normal flow related enhancement of the main branch vessels. Patent posterior cerebral arteries. Slightly decreased and flow related enhancement of the distal LEFT posterior cerebral artery though, with normal in caliber, likely representing slow flow secondary to local mass effect. No large vessel occlusion, high-grade stenosis, abnormal luminal irregularity, aneurysm. MRV HEAD FINDINGS Normal flow related enhancement within the superior sagittal sinus, torcula of the Herophili, bilateral transverse, sigmoid sinuses and included internal jugular veins. Normal flow related enhancement of the internal cerebral veins. IMPRESSION: MRI HEAD: Acute large LEFT parietal intraparenchymal hematoma dissecting along the corpus callosum, intraventricular extension without obstructive hydrocephalus. Scattered subarachnoid hemorrhage better seen on today's CT head. Findings suggest postpartum angiopathy ; postpartum angiopathy typically affects the medium and small vessels which are not well characterized on MRA of the head; follow-up CTA of the head or digital subtraction angiography could be performed as clinically indicated . Location is also typical for venous infarction though, there is no major dural venous sinus thrombosis. Additional consideration are vascular malformation or hemorrhagic mass. Recommend followup MRI of the head with contrast in 2-4 weeks. MRA HEAD: No acute vascular process ; normal MRA head. MRV HEAD: No dural venous sinus thrombosis ; normal noncontrast MRV head. Electronically Signed By: Elon Alas M.D. On: 05/21/2015 03:35   Dg Chest Portable 1 View  05/21/2015 CLINICAL DATA: Central line repositioning. Initial  encounter. EXAM: PORTABLE CHEST - 1 VIEW COMPARISON: None. FINDINGS: The patient's left IJ line has been retracted, and is now seen ending overlying the proximal SVC. The lungs are well-aerated and clear. There is no evidence of focal opacification, pleural effusion or pneumothorax. The cardiomediastinal silhouette is borderline normal in size. No acute osseous abnormalities are seen. IMPRESSION: 1. Left IJ line has been retracted, and is now seen ending overlying the proximal SVC. 2. No acute cardiopulmonary process seen. Electronically Signed By: Garald Balding M.D. On: 05/21/2015 02:21   Dg Chest Portable 1 View  05/21/2015 CLINICAL DATA: Left central line placement. Initial encounter. EXAM: PORTABLE CHEST - 1 VIEW COMPARISON: None. FINDINGS: The patient's left IJ line is noted extending overlying the right brachiocephalic vein.  This should be retracted at least 5 cm and repositioned. The lungs are hypoexpanded. No pleural effusion or pneumothorax is seen. The cardiomediastinal silhouette is borderline normal in size. No acute osseous abnormalities are identified. IMPRESSION: 1. Left IJ line noted ending overlying the right brachiocephalic vein. This should be retracted at least 5 cm and repositioned, as deemed clinically appropriate. 2. Lungs hypoexpanded but grossly clear. These results were called by telephone at the time of interpretation on 05/21/2015 at 2:15 am to [facility], who verbally acknowledged these results. Electronically Signed By: Garald Balding M.D. On: 05/21/2015 02:15   Mr Mrv Head Wo Cm  05/21/2015 CLINICAL DATA: Headache today, worse than typical migraine. Acute onset RIGHT-sided weakness. Acute RIGHT focal seizure in emergency department. Two weeks postpartum. Evaluate intracranial hemorrhage. EXAM: MRI HEAD WITHOUT CONTRAST MRA HEAD WITHOUT CONTRAST MRV HEAD WITHOUT CONTRAST TECHNIQUE: Multiplanar, multiecho pulse sequences of the brain and  surrounding structures were obtained without and with intravenous contrast. Angiographic images of the head were obtained using MRA and MRV technique without contrast. MIP images provided. COMPARISON: CT head May 20, 2015 at 2354 hours FINDINGS: MRI HEAD FINDINGS Acute mesial LEFT parietal intraparenchymal hematoma better seen on prior CT, dissecting along the body of the corpus callosum to the RIGHT. Intraventricular extension with layering blood products in the LEFT lateral ventricle. No hydrocephalus. Minimal surrounding T2 bright vasogenic edema, local sulcal effacement without midline shift. Mass effect on LEFT lateral ventricle. No satellite areas of abnormal parenchymal signal. Susceptibility artifact along the interhemispheric fissure, LEFT sylvian fissure in LEFT cerebellar tentorium consistent with acute blood products seen on today's CT. Bilateral hippocampi demonstrate normal size, morphology and signal characteristics. Ocular globes and orbital contents are unremarkable. Paranasal sinuses and mastoid air cells are well aerated. No abnormal sellar expansion. No cerebellar tonsillar ectopia. No suspicious calvarial bone marrow signal. MRA HEAD FINDINGS Anterior circulation: Normal flow related enhancement of the included cervical, petrous, cavernous and supra clinoid internal carotid arteries. Patent anterior communicating artery. Normal flow related enhancement of the anterior and middle cerebral arteries, including more distal segments. RIGHT AC 1 segment is dominant. No large vessel occlusion, high-grade stenosis, abnormal luminal irregularity, aneurysm. Posterior circulation: Codominant vertebral arteries. Basilar artery is patent, with normal flow related enhancement of the main branch vessels. Patent posterior cerebral arteries. Slightly decreased and flow related enhancement of the distal LEFT posterior cerebral artery though, with normal in caliber, likely representing slow flow  secondary to local mass effect. No large vessel occlusion, high-grade stenosis, abnormal luminal irregularity, aneurysm. MRV HEAD FINDINGS Normal flow related enhancement within the superior sagittal sinus, torcula of the Herophili, bilateral transverse, sigmoid sinuses and included internal jugular veins. Normal flow related enhancement of the internal cerebral veins. IMPRESSION: MRI HEAD: Acute large LEFT parietal intraparenchymal hematoma dissecting along the corpus callosum, intraventricular extension without obstructive hydrocephalus. Scattered subarachnoid hemorrhage better seen on today's CT head. Findings suggest postpartum angiopathy ; postpartum angiopathy typically affects the medium and small vessels which are not well characterized on MRA of the head; follow-up CTA of the head or digital subtraction angiography could be performed as clinically indicated . Location is also typical for venous infarction though, there is no major dural venous sinus thrombosis. Additional consideration are vascular malformation or hemorrhagic mass. Recommend followup MRI of the head with contrast in 2-4 weeks. MRA HEAD: No acute vascular process ; normal MRA head. MRV HEAD: No dural venous sinus thrombosis ; normal noncontrast MRV head. Electronically Signed By: Elon Alas  M.D. 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 Does the need for close, 24 hr/day medical supervision in concert with the patient's rehab needs make it unreasonable for this patient to be served in a less intensive setting? Yes 2. Co-Morbidities requiring supervision/potential complications: aphasia, apraxia,Right inattention 3. Due to bladder management, bowel management, safety, skin/wound care, disease management, medication administration, pain management and patient education, does the patient require 24 hr/day rehab nursing?  Yes 4. Does the patient require coordinated care of a physician, rehab nurse, PT (11-2 hrs/day, 5 days/week), OT (1-2 hrs/day, 5 days/week) and SLP (.5-1 hrs/day, 5 days/week) to address physical and functional deficits in the context of the above medical diagnosis(es)? Yes Addressing deficits in the following areas: balance, endurance, locomotion, strength, transferring, bowel/bladder control, bathing, dressing, feeding, grooming, toileting and cognition 5. Can the patient actively participate in an intensive therapy program of at least 3 hrs of therapy per day at least 5 days per week? Yes 6. The potential for patient to make measurable gains while on inpatient rehab is excellent 7. Anticipated functional outcomes upon discharge from inpatient rehab are min assist with PT, min assist with OT, min assist with SLP. 8. Estimated rehab length of stay to reach the above functional goals is: 20-25 days 9. Does the patient have adequate social supports and living environment to accommodate these discharge functional goals? Yes 10. Anticipated D/C setting: Home 11. Anticipated post D/C treatments: Outpatient therapy 12. Overall Rehab/Functional Prognosis: excellent  RECOMMENDATIONS: This patient's condition is appropriate for continued rehabilitative care in the following setting: CIR Patient has agreed to participate in recommended program. N/A Note that insurance prior authorization may be required for reimbursement for recommended care.  Comment:     05/22/2015       Revision History     Date/Time User Provider Type Action   05/23/2015 1:45 PM Ashley Blake, MD Physician Sign   05/23/2015 1:32 PM Ashley Blake, MD Physician Share   05/22/2015 3:29 PM Bary Leriche, PA-C Physician Assistant Share   View Details Report       Routing History     Date/Time From To Method   05/23/2015 1:45 PM Ashley Blake, MD Ashley Blake, MD In Basket   05/23/2015 1:45 PM  Ashley Blake, MD No Pcp Per Patient In Basket

## 2015-06-05 NOTE — Progress Notes (Signed)
Retta Diones, RN Rehab Admission Coordinator Signed Physical Medicine and Rehabilitation PMR Pre-admission 06/04/2015 10:53 AM  Related encounter: Admission (Discharged) from 06/02/2015 in [facility]   PMR Admission Coordinator Pre-Admission Assessment  Patient: Ashley [name] is an 39 y.o., female MRN: [id number] DOB: 08-07-1976 Height: 5\' 3"  (160 cm) Weight: 64.5 kg (142 lb 3.2 oz)  Insurance Information HMO: PPO: PCP: IPA: 80/20: OTHER: Blue Advantage Silver PRIMARY: BCBS Policy#: [id number] Subscriber: self CM Name: Harrie Jeans Phone#:[phone] Fax#: [phone] Pre-Cert#: [id number] Employer: [employer] Benefits: Phone #: [phone] Name: On line Eff. Date: 10/13/14 Deduct: $3500 Out of Pocket Max: [phone] Life Max: None CIR: 70% with auth SNF: 70% w/auth Outpatient: 30 visits max Co-Pay:$25 copay Home Health: 70% Co-Pay: 30% DME: 70% Co-Pay: 30% Providers: in network  Medicaid Application Date: Case Manager:  Disability Application Date: Case Worker:   Emergency Facilities manager Information    Name Relation Home Work [place] Spouse [phone]  [phone]   Collins,Teresq Mother   [phone]     Current Medical History  Patient Admitting Diagnosis: L parietal ICH, seizure  History of Present Illness: A 39 y.o. Postpartum female admitted on 05/20/15 with complaints of HA since delivery She was admitted on 05/21/15 with right facial droop with aphasia, right sided weakness and seizures. She was found to have acute large left fronto-parietal IPH/SAH and was started on Keppra for treatment. She developed  vasogenic edema and was treated with hypertonic saline and follow up CCT 08/12 showed stability. Work up without definite cerebral venous sinus/cortical vein thrombosis and no anticoagulation needed. Patient with resultant dense left hemiplegia, aphasia as well as dysphagia and was admitted to CIR on 05/28/15 for rehab. She has had issues with sleep wake disruption as well as anxiety which has improved with addition of Seroquel. Foley was discontinued on 08/16 and she was started on Keflex for Klebsiella UTI. With increase in activity level, she has had worsening of RLE spasms and Zanaflex was added and titrated to bid to help with symptoms. Blood pressures were controlled and po intake had improved. On 06/02/15 am, patient developed seizures with obtundation. She was noted to be severely hypotensive and was treated with fluid bolus as well as IV Keppra and IV antibiotics due to concerns of sepsis. She was transferred to stepdown unit for closer monitoring.   Total: 9=NIH  Past Medical History  Past Medical History  Diagnosis Date  . Anxiety   . ICH (intracerebral hemorrhage)     Family History  family history includes Cancer in her father and mother.  Prior Rehab/Hospitalizations: Recent admission to CIR 05/26/15 to 06/02/15. Transferred back to acute hospital 06/02/15 after seizure.  Has the patient had major surgery during 100 days prior to admission? No. Delivery of baby4 weeks ago on 05/07/15.  Current Medications   Current facility-administered medications:  . acetaminophen (TYLENOL) tablet 650 mg, 650 mg, Oral, Q6H PRN, Geradine Girt, DO, 650 mg at 06/04/15 1302 . docusate sodium (COLACE) capsule 100 mg, 100 mg, Oral, Daily, Jessica U Vann, DO, 100 mg at 06/04/15 1004 . levETIRAcetam (KEPPRA) tablet 1,500 mg, 1,500 mg, Oral, BID, Alexis Goodell, MD, 1,500 mg at 06/04/15 1003 . ondansetron (ZOFRAN) tablet 4 mg, 4 mg, Oral, Q6H PRN **OR** ondansetron (ZOFRAN)  injection 4 mg, 4 mg, Intravenous, Q6H PRN, Theressa Millard, MD . oxyCODONE (Oxy IR/ROXICODONE) immediate release tablet 5 mg, 5 mg, Oral, Q4H PRN,  Theressa Millard, MD, 5 mg at 06/03/15 1627 . sulfamethoxazole-trimethoprim (BACTRIM DS,SEPTRA DS) 800-160 MG per tablet 1 tablet, 1 tablet, Oral, Q12H, Delfina Redwood, MD, 1 tablet at 06/04/15 1003 . traZODone (DESYREL) tablet 50 mg, 50 mg, Oral, QHS PRN, Geradine Girt, DO, 50 mg at 06/03/15 0129  Patients Current Diet: Diet regular Room service appropriate?: Yes; Fluid consistency:: Thin  Precautions / Restrictions Precautions Precautions: Fall Precaution Comments: Flaccid with noted mild intermittent flexor tone at elbow Restrictions Weight Bearing Restrictions: No   Has the patient had 2 or more falls or a fall with injury in the past year?No  Prior Activity Level Community (5-7x/wk): Very active PTA. Ran on the treadmill 4 miles a day. Went to the pool with two older children. Has a newborn son 72 weeks old.  Home Assistive Devices / Equipment Home Assistive Devices/Equipment: None Home Equipment: None  Prior Device Use: Indicate devices/aids used by the patient prior to current illness, exacerbation or injury? None  Prior Functional Level Prior Function Level of Independence: Independent Comments: Medical sales; running  Self Care: Did the patient need help bathing, dressing, using the toilet or eating? Independent  Indoor Mobility: Did the patient need assistance with walking from room to room (with or without device)? Independent  Stairs: Did the patient need assistance with internal or external stairs (with or without device)? Independent  Functional Cognition: Did the patient need help planning regular tasks such as shopping or remembering to take medications? Independent  Current Functional Level Cognition  Overall Cognitive Status: Impaired/Different from baseline Current Attention Level:  Sustained Orientation Level: Oriented to person, Other (comment) (expressive aphasia ) Following Commands: Follows one step commands consistently, Follows one step commands with increased time Safety/Judgement: Decreased awareness of safety   Extremity Assessment (includes Sensation/Coordination)  Upper Extremity Assessment: Defer to OT evaluation RUE Deficits / Details: Pt demonstrates full PROM, flaccid other than intermittent mild flexion tone at elbow RUE Coordination: decreased gross motor, decreased fine motor  Lower Extremity Assessment: RLE deficits/detail RLE Deficits / Details: MIld tone noted throughout RLE. No voluntary movement throughout LLE ~0/5.     ADLs  Overall ADL's : Needs assistance/impaired Eating/Feeding: Set up, Supervision/ safety, Sitting Grooming: Brushing hair, Wash/dry face, Moderate assistance, Sitting Grooming Details (indicate cue type and reason): Pt knew what to do with comb, but having difficulty due to having to use left hand and pt with multiple tangles Upper Body Bathing: Moderate assistance Upper Body Bathing Details (indicate cue type and reason): Due to cues needed to sequence for throughness Lower Body Bathing: Moderate assistance Lower Body Bathing Details (indicate cue type and reason): with min A sit<>stand, Mod A to maintain standing while washing peri area. A to hold RLE crossed over LLE Upper Body Dressing : Maximal assistance, Sitting Lower Body Dressing: Maximal assistance (with min A sit<>stand and Mod A to maintain balance with dynamic task) Toilet Transfer: Moderate assistance, Stand-pivot, BSC Toileting- Clothing Manipulation and Hygiene: Moderate assistance (with min A sit<>stand and Mod A to maintain standing for peri-hygiene)    Mobility  Overal bed mobility: Needs Assistance Bed Mobility: Supine to Sit Rolling: Supervision (to the right) Sidelying to sit: Mod assist Supine to sit: Mod assist, HOB elevated General bed  mobility comments: Assist with RLE and cues to reciprocally scoot bottom to EOB.     Transfers  Overall transfer level: Needs assistance Equipment used: Rolling walker (2 wheeled) Transfers: Sit to/from Stand Sit to Stand: Mod assist Stand pivot transfers:  Mod assist, +2 physical assistance, Min assist General transfer comment: Mod A progressing to Min A to stand from EOB x1, from chair x1, from [facility] x1. Cues for weight bearing through RUE and anterior weight shift/translation to power up. Manual cues for upright posture. SPT bed to [facility]; BSC to chair with pivotal steps and cues.     Ambulation / Gait / Stairs / Wheelchair Mobility  Ambulation/Gait Ambulation/Gait assistance: Max assist, +2 physical assistance Ambulation Distance (Feet): 4 Feet Assistive device: Rolling walker (2 wheeled) Gait Pattern/deviations: Step-to pattern, Decreased stride length, Narrow base of support, Decreased dorsiflexion - right, Decreased weight shift to right General Gait Details: Difficulty advancing LLE (pivoting foot some) even with assist for weight shifting. Therapist facilitating advancement of RLE and blocking right knee during stance phase. Pt with inversion right ankle during stance. Cues for upright posture.    Posture / Balance Dynamic Sitting Balance Sitting balance - Comments: Able to perform reciprocal scooting for getting to EOB and better positioning in chair with Min A for right pelvis. Balance Overall balance assessment: Needs assistance Sitting-balance support: Feet supported, Single extremity supported Sitting balance-Leahy Scale: Fair Sitting balance - Comments: Able to perform reciprocal scooting for getting to EOB and better positioning in chair with Min A for right pelvis. Standing balance support: During functional activity Standing balance-Leahy Scale: Poor Standing balance comment: ABle to stand with Mod A for balance, upright posture and right knee stability.      Special needs/care consideration BiPAP/CPAP No CPM No Continuous Drip IV No Dialysis No  Life Vest No Oxygen No Special Bed No Trach Size No Wound Vac (area) No  Skin No  Bowel mgmt: Last BM 06/02/15 with incontinence at times Bladder mgmt: Incontinent at times per documentation Diabetic mgmt No    Previous Home Environment Living Arrangements: Spouse/significant other, Children, Parent Lives With: Spouse Available Help at Discharge: Family, Available 24 hours/day Type of Home: House Home Layout: Two level, Bed/bath upstairs Alternate Level Stairs-Rails: Right, Left Alternate Level Stairs-Number of Steps: full flight  Home Access: Stairs to enter Entrance Stairs-Rails: None Entrance Stairs-Number of Steps: 3-4 in front and 2 via garage Bathroom Shower/Tub: Tub/shower unit, Curtain, Other (comment) Bathroom Toilet: Standard Bathroom Accessibility: Yes How Accessible: Accessible via walker Home Care Services: No Additional Comments: lives with spouse; 9 wk old son; shares custody of 2 children 58yo, 28yo; Parents and inlaws retired & can assist; Also sister n Sports coach.   Discharge Living Setting Plans for Discharge Living Setting: Patient's home, House, Lives with (comment) (Lives with husband, mom, and 3 children.) Type of Home at Discharge: House Discharge Home Layout: Two level, Bed/bath upstairs Discharge Home Access: Stairs to enter Entrance Stairs-Rails: None Entrance Stairs-Number of Steps: 1-2 steps garage entry Discharge Bathroom Shower/Tub: Tub/shower unit Discharge Bathroom Toilet: Standard Discharge Bathroom Accessibility: Yes How Accessible: Accessible via walker Does the patient have any problems obtaining your medications?: No  Social/Family/Support Systems Patient Roles: Spouse, Parent, Other (Comment) (Has a husband, now has 3 children, mom, sister.) Contact Information: [name] Ciano - husband Anticipated Caregiver:  Husband and mother Anticipated Caregiver's Contact Information: [name] - spouse [phone] and Helene Kelp mom [phone] Ability/Limitations of Caregiver: Husband is self employed with 15 employees. He can take a month of if needed. Patient's mom has moved into their home and plans to stay indefinitely. Mom assisting with care of baby. Patient has joint custody of her other 2 children, ages 33 and 65. Caregiver Availability: 24/7 Discharge Plan Discussed with Primary  Caregiver: Yes Is Caregiver In Agreement with Plan?: Yes Does Caregiver/Family have Issues with Lodging/Transportation while Pt is in Rehab?: No  Goals/Additional Needs Patient/Family Goal for Rehab: PT/OT/ST min assist Expected length of stay: 20-25 days Cultural Considerations: None Dietary Needs: Regular diet, thin liquids Equipment Needs: TBD Special Service Needs: Patient is lactating Pt/Family Agrees to Admission and willing to participate: Yes Program Orientation Provided & Reviewed with Pt/Caregiver Including Roles & Responsibilities: Yes  Decrease burden of Care through IP rehab admission: No  Possible need for SNF placement upon discharge: Not aticipated  Patient Condition: This patient's medical and functional status has changed since the consult dated: 05/23/15 in which the Rehabilitation Physician determined and documented that the patient's condition is appropriate for intensive rehabilitative care in an inpatient rehabilitation facility. See "History of Present Illness" (above) for medical update. Functional changes are: Currently requiring mod/max assist with ADLs and mod assist for transfers. Patient's medical and functional status update has been discussed with the Rehabilitation physician and patient remains appropriate for inpatient rehabilitation. Will admit to inpatient rehab today.  Preadmission Screen Completed By: Retta Diones, 06/04/2015 1:48  PM ______________________________________________________________________  Discussed status with Dr. Naaman Plummer on 06/04/15 at 1348 and received telephone approval for admission today.  Admission Coordinator: Retta Diones, time1348/Date08/22/16          Cosigned by: Meredith Staggers, MD at 06/04/2015 2:26 PM  Revision History     Date/Time User Provider Type Action   06/04/2015 2:26 PM Meredith Staggers, MD Physician Cosign   06/04/2015 1:49 PM Retta Diones, RN Rehab Admission Coordinator Sign

## 2015-06-05 NOTE — Plan of Care (Signed)
Problem: RH BOWEL ELIMINATION Goal: RH STG MANAGE BOWEL WITH ASSISTANCE STG Manage Bowel with Assistance.  Outcome: Progressing With Mod. A  Problem: RH SKIN INTEGRITY Goal: RH STG SKIN FREE OF INFECTION/BREAKDOWN With. Mod. A  Problem: RH SAFETY Goal: RH STG ADHERE TO SAFETY PRECAUTIONS W/ASSISTANCE/DEVICE STG Adhere to Safety Precautions With Assistance/Device.  With Mod. A  Problem: RH PAIN MANAGEMENT Goal: RH STG PAIN MANAGED AT OR BELOW PT'S PAIN GOAL Less than 3,on scale 1 to 10

## 2015-06-05 NOTE — Progress Notes (Signed)
Subjective/Complaints: No Further seizures since 06/02/2015.patient appears bright this morning. Feels better. Ready to resume rehabilitation program  ROS cannot obtain due to aphasia Objective: Vital Signs: Blood pressure 102/68, pulse 71, temperature 98.7 F (37.1 C), temperature source Oral, resp. rate 19, SpO2 97 %, unknown if currently breastfeeding. No results found. Results for orders placed or performed during the hospital encounter of 06/02/15 (from the past 72 hour(s))  Lactic acid, plasma     Status: None   Collection Time: 06/02/15 12:45 PM  Result Value Ref Range   Lactic Acid, Venous 1.4 0.5 - 2.0 mmol/L  Magnesium     Status: None   Collection Time: 06/02/15 12:45 PM  Result Value Ref Range   Magnesium 1.8 1.7 - 2.4 mg/dL  Phosphorus     Status: None   Collection Time: 06/02/15 12:45 PM  Result Value Ref Range   Phosphorus 4.3 2.5 - 4.6 mg/dL  Culture, Urine     Status: None   Collection Time: 06/03/15 12:09 AM  Result Value Ref Range   Specimen Description URINE, RANDOM    Special Requests NONE    Culture NO GROWTH 1 DAY    Report Status 06/04/2015 FINAL   Basic metabolic panel     Status: Abnormal   Collection Time: 06/03/15  3:00 AM  Result Value Ref Range   Sodium 135 135 - 145 mmol/L   Potassium 3.9 3.5 - 5.1 mmol/L   Chloride 98 (L) 101 - 111 mmol/L   CO2 28 22 - 32 mmol/L   Glucose, Bld 94 65 - 99 mg/dL   BUN 7 6 - 20 mg/dL   Creatinine, Ser 0.65 0.44 - 1.00 mg/dL   Calcium 8.6 (L) 8.9 - 10.3 mg/dL   GFR calc non Af Amer >60 >60 mL/min   GFR calc Af Amer >60 >60 mL/min    Comment: (NOTE) The eGFR has been calculated using the CKD EPI equation. This calculation has not been validated in all clinical situations. eGFR's persistently <60 mL/min signify possible Chronic Kidney Disease.    Anion gap 9 5 - 15  CBC     Status: Abnormal   Collection Time: 06/03/15  3:00 AM  Result Value Ref Range   WBC 6.8 4.0 - 10.5 K/uL   RBC 4.26 3.87 - 5.11  MIL/uL   Hemoglobin 12.6 12.0 - 15.0 g/dL   HCT 38.8 36.0 - 46.0 %   MCV 91.1 78.0 - 100.0 fL   MCH 29.6 26.0 - 34.0 pg   MCHC 32.5 30.0 - 36.0 g/dL   RDW 13.7 11.5 - 15.5 %   Platelets 403 (H) 150 - 400 K/uL     HEENT: normal Cardio: RRR and no murmur Resp: CTA B/L and unlabored GI: BS positive and NT, ND Extremity:  BS positive and NT, ND Skin:   Central line site healed over Neuro: Alert/Oriented, Abnormal Sensory reduced sensation to pinch , Left side 4-5/5, Abnormal Motor 0/5 RUE, Trace knee ext RLE and Aphasic,  Musc/Skel:  Other no pain with UE or LE ROM Gen NAD   Assessment/Plan: 1. Functional deficits secondary to  Right flaccid hemiplegia,dysphagia, and aphasia secondary to large left parietal intracranial hemorrhage  seizure with hypotension Patient requires conference of intensive inpatient dictation with PT, OT, speech therapy. 24 hour rehabilitation nursing as well as M.D. Coverage. Program is being monitored by rehabilitation M.D. On a daily basis. Team conference in the morning.  FIM:       Function -  Toileting Toileting steps completed by helper: Adjust clothing prior to toileting, Performs perineal hygiene, Adjust clothing after toileting Assist level: Two helpers  Function - Air cabin crew transfer assistive device: Facilities manager lift: [place] level to toilet: 2 helpers Assist level from toilet: 2 helpers        Function - Comprehension Comprehension: Auditory Comprehension assist level: Follows basic conversation/direction with extra time/assistive device  Function - Expression Expression: Verbal Expression assist level: Expresses basic 90% of the time/requires cueing < 10% of the time.  Function - Social Interaction Social Interaction assist level: Interacts appropriately 90% of the time - Needs monitoring or encouragement for participation or interaction.  Function - Problem Solving Problem solving assist  level: Solves basic 50 - 74% of the time/requires cueing 25 - 49% of the time  Function - Memory Memory assist level: Recognizes or recalls 50 - 74% of the time/requires cueing 25 - 49% of the time Patient normally able to recall (first 3 days only): Location of own room, Staff names and faces, That he or she is in a hospital, Current season  Medical Problem List and Plan: 1. Functional deficits secondary to Right flaccid hemiplegia,dysphagia, and aphasia secondary to large left parietal intracranial hemorrhage, also severe cog def, spasticity , would avoid Zanaflex due to hypotension 2. DVT Prophylaxis/Anticoagulation: Pharmaceutical: Lovenox 3. Pain Management: Tylenol prn 4. Mood: LCSW to follow for evaluation and support as cognition/aphasia improves.  5. Neuropsych: This patient is not capable of making decisions on her own behalf. 6. Skin/Wound Care: Routine pressure relief measures.  7. Fluids/Electrolytes/Nutrition: Monitor I/O. 75% meals yesterday, monitor fluids 8. Insomnia?:and agitation , didn't receive trazodone on Monday, make  Trazodone prn, cont qhs  seroquel 9. New onset seizures: with breakthrough On keppra bid., monitor valproate level , 10.UTI , Kleb P seen on urine culture, on Bactrim, follow-up culture negative,, finish out 5 day course LOS (Days) 1 A FACE TO FACE EVALUATION WAS PERFORMED  Cynithia Hakimi E 06/05/2015, 9:38 AM

## 2015-06-06 ENCOUNTER — Inpatient Hospital Stay (HOSPITAL_COMMUNITY): Payer: BLUE CROSS/BLUE SHIELD | Admitting: Occupational Therapy

## 2015-06-06 ENCOUNTER — Inpatient Hospital Stay (HOSPITAL_COMMUNITY): Payer: BLUE CROSS/BLUE SHIELD | Admitting: Speech Pathology

## 2015-06-06 ENCOUNTER — Inpatient Hospital Stay (HOSPITAL_COMMUNITY): Payer: BLUE CROSS/BLUE SHIELD | Admitting: Physical Therapy

## 2015-06-06 DIAGNOSIS — I69120 Aphasia following nontraumatic intracerebral hemorrhage: Secondary | ICD-10-CM

## 2015-06-06 DIAGNOSIS — I69151 Hemiplegia and hemiparesis following nontraumatic intracerebral hemorrhage affecting right dominant side: Secondary | ICD-10-CM

## 2015-06-06 MED ORDER — BACLOFEN 10 MG PO TABS
5.0000 mg | ORAL_TABLET | Freq: Two times a day (BID) | ORAL | Status: DC
  Administered 2015-06-06 – 2015-06-08 (×6): 5 mg via ORAL
  Filled 2015-06-06 (×6): qty 1

## 2015-06-06 NOTE — Plan of Care (Signed)
Problem: RH BOWEL ELIMINATION Goal: RH STG MANAGE BOWEL WITH ASSISTANCE STG Manage Bowel with Min. Assistance.  Outcome: Progressing PRN senna given

## 2015-06-06 NOTE — Progress Notes (Signed)
Speech Language Pathology Daily Session Note  Patient Details  Name: Ashley [name] MRN: [id number] Date of Birth: Feb 01, 1976  Today's Date: 06/06/2015 SLP Individual Time: 0900-1000 SLP Individual Time Calculation (min): 60 min  Short Term Goals: Week 1: SLP Short Term Goal 1 (Week 1): Pt will consume her currently prescribed diet with mod I use of swallowing precautions.   SLP Short Term Goal 2 (Week 1): Pt will monitor and correct verbal errors during structured and unstructured tasks with min assist verbal cues.   SLP Short Term Goal 3 (Week 1): Pt will follow abstract, multi-step commands during functional tasks with min assist verbal cues in 75% of observable opportunities.  SLP Short Term Goal 4 (Week 1): Pt will return demonstration of at least 2 safety precautions during functional tasks with min assist verbal cues.  Skilled Therapeutic Interventions:  Pt was seen for skilled ST targeting cognitive-linguistic goals.  Upon arrival, pt was seated upright in recliner, awake, alert, and agreeable to participate in [place].  RN and nurse tech were in the room with pt and reported that pt was attempting to get out of bed without assistance prior to SLP's arrival; however, pt was not otherwise impulsive during today's therapy session.  SLP facilitated the session with a generative naming task targeting use of word finding strategies in a structured context.  Pt required mod faded to min assist verbal/semantic cues to name specific members of a given category during the abovementioned task.  Pt was noted with good insight into task difficulty and exhibited carryover of targeted skills within task as evidenced by SLP's ability to fade cues as task progressed.  SLP also facilitated the session with a basic descriptive naming task to continue to address use of word finding strategies.  Pt was able to describe both basic and semi-complex people and objects with supervision cues.  Verbal errors were most  consistently characterized by phonemic paraphasias, of which pt was aware and was able to self correct in >80% of opportunities.  Pt was left in recliner with call bell in reach and mother present at bedside.  Continue per current plan of care.    Function:  Eating Eating       Cognition Comprehension Comprehension assist level: Follows basic conversation/direction with extra time/assistive device  Expression   Expression assist level: Expresses basic 75 - 89% of the time/requires cueing 10 - 24% of the time. Needs helper to occlude trach/needs to repeat words.  Social Interaction Social Interaction assist level: Interacts appropriately 90% of the time - Needs monitoring or encouragement for participation or interaction.  Problem Solving Problem solving assist level: Solves basic 50 - 74% of the time/requires cueing 25 - 49% of the time  Memory Memory assist level: Recognizes or recalls 50 - 74% of the time/requires cueing 25 - 49% of the time    Pain Pain Assessment Pain Assessment: No/denies pain   Therapy/Group: Individual Therapy  [name], Selinda Orion 06/06/2015, 12:46 PM

## 2015-06-06 NOTE — Progress Notes (Signed)
Subjective/Complaints: No Further seizures since 06/02/2015.patient appears alert in bed. Reports sleeping well No spasms last noc  ROS limited due to aphasia Objective: Vital Signs: Blood pressure 102/66, pulse 78, temperature 98.9 F (37.2 C), temperature source Oral, resp. rate 19, SpO2 97 %, unknown if currently breastfeeding. No results found. No results found for this or any previous visit (from the past 72 hour(s)).   HEENT: normal Cardio: RRR and no murmur Resp: CTA B/L and unlabored GI: BS positive and NT, ND Extremity:  BS positive and NT, ND Skin:   Central line site healed over Neuro: Alert/Oriented, Abnormal Sensory reduced sensation to LT and proprio RUE and RLE , Left side 4-5/5, Abnormal Motor 3-/5 RUE grip, 2- bi and tri , 0 at delt, 2- hip/knee ext RLE and Aphasic,  Musc/Skel:  Other no pain with UE or LE ROM Gen NAD   Assessment/Plan: 1. Functional deficits secondary to  Right flaccid hemiplegia,dysphagia, and aphasia secondary to large left parietal intracranial hemorrhage  seizure with hypotension Patient requires conference of intensive inpatient dictation with PT, OT, speech therapy. 24 hour rehabilitation nursing as well as M.D. Coverage. Program is being monitored by rehabilitation M.D. On a daily basis. Team conference today please see physician documentation under team conference tab, met with team face-to-face to discuss problems,progress, and goals. Formulized individual treatment plan based on medical history, underlying problem and comorbidities.  FIM: Function - Bathing Position: Shower Body parts bathed by patient: Right arm, Chest, Abdomen, Front perineal area, Left upper leg, Left lower leg, Right upper leg Body parts bathed by helper: Buttocks, Left arm, Back Assist Level: Supervision or verbal cues Set up : To obtain items, To open containers  Function- Upper Body Dressing/Undressing What is the patient wearing?: Pull over  shirt/dress Pull over shirt/dress - Perfomed by patient: Thread/unthread left sleeve, Put head through opening Pull over shirt/dress - Perfomed by helper: Pull shirt over trunk, Thread/unthread right sleeve Assist Level: Touching or steadying assistance(Pt > 75%) Function - Lower Body Dressing/Undressing What is the patient wearing?: Pants, Non-skid slipper socks Position: Standing at sink Pants- Performed by patient: Thread/unthread left pants leg, Thread/unthread right pants leg Pants- Performed by helper: Pull pants up/down Non-skid slipper socks- Performed by patient: Don/doff right sock, Don/doff left sock Assist Level: Touching or steadying assistance (Pt > 75%)  Function - Toileting Toileting activity did not occur: Safety/medical concerns Toileting steps completed by patient: Performs perineal hygiene, Adjust clothing prior to toileting Toileting steps completed by helper: Adjust clothing after toileting Toileting Assistive Devices: Grab bar or rail Assist level: More than reasonable time, Touching or steadying assistance (Pt.75%)  Function - Air cabin crew transfer assistive device: Grab bar Mechanical lift: Stedy Assist level to toilet: Touching or steadying assistance (Pt > 75%) Assist level from toilet: Touching or steadying assistance (Pt > 75%)  Function - Chair/bed transfer Chair/bed transfer method: Stand pivot Chair/bed transfer assist level: Moderate assist (Pt 50 - 74%/lift or lower) Chair/bed transfer assistive device: Armrests Chair/bed transfer details: Manual facilitation for weight shifting, Manual facilitation for placement, Verbal cues for technique, Visual cues/gestures for sequencing  Function - Locomotion: Wheelchair Will patient use wheelchair at discharge?: Yes Type: Manual Max wheelchair distance: 150 Assist Level: Touching or steadying assistance (Pt > 75%) Assist Level: Touching or steadying assistance (Pt > 75%) Assist Level: Touching  or steadying assistance (Pt > 75%) Function - Locomotion: Ambulation Assistive device: Rail in hallway, [place] Max distance: 22' with each AD Assist level: 2 helpers (  max x 1, +2 for w/c follow) Assist level: 2 helpers Walk 50 feet with 2 turns activity did not occur: Safety/medical concerns Walk 150 feet activity did not occur: Safety/medical concerns Walk 10 feet on uneven surfaces activity did not occur: Safety/medical concerns  Function - Comprehension Comprehension: Auditory Comprehension assist level: Follows basic conversation/direction with extra time/assistive device  Function - Expression Expression: Verbal Expression assist level: Expresses basic 90% of the time/requires cueing < 10% of the time.  Function - Social Interaction Social Interaction assist level: Interacts appropriately 90% of the time - Needs monitoring or encouragement for participation or interaction.  Function - Problem Solving Problem solving assist level: Solves basic 50 - 74% of the time/requires cueing 25 - 49% of the time  Function - Memory Memory assist level: Recognizes or recalls 50 - 74% of the time/requires cueing 25 - 49% of the time Patient normally able to recall (first 3 days only): Location of own room, Staff names and faces, That he or she is in a hospital, Current season  Medical Problem List and Plan: 1. Functional deficits secondary to Right flaccid hemiplegia,dysphagia, and aphasia secondary to large left parietal intracranial hemorrhage, also severe cog def, spasticity ,            no Zanaflex due to hypotension 2. DVT Prophylaxis/Anticoagulation: Pharmaceutical: Lovenox 3. Pain Management: Tylenol prn 4. Mood: LCSW to follow for evaluation and support as cognition/aphasia improves.  5. Neuropsych: This patient is not capable of making decisions on her own behalf. 6. Skin/Wound Care: Routine pressure relief measures.  7. Fluids/Electrolytes/Nutrition: Monitor I/O. 75% meals  yesterday, 481m po fluids 8. Insomnia improved on trazodone 9. New onset seizures: with breakthrough now controlled on keppra 15055mbid., monitor valproate level , 10.UTI , Kleb P seen on urine culture, on Bactrim, follow-up culture negative,, finish out 5 day course LOS (Days) 2 A FACE TO FACE EVALUATION WAS PERFORMED  KIRSTEINS,ANDREW E 06/06/2015, 8:13 AM

## 2015-06-06 NOTE — Progress Notes (Signed)
Occupational Therapy Session Note  Patient Details  Name: Ashley [name] MRN: [id number] Date of Birth: [birth year]  Today's Date: 06/06/2015 OT Individual Time: 1330-1430 OT Individual Time Calculation (min): 60 min    Short Term Goals: Week 1:  OT Short Term Goal 1 (Week 1): Pt will sequence bathing 10 out of 10 parts with min questioning cues OT Short Term Goal 2 (Week 1): Pt will complete toilet transfer with min assist to place right L/UE OT Short Term Goal 3 (Week 1): Pt will complete sit>stand in prep for lower body B & D with max  OT Short Term Goal 4 (Week 1): Pt will use right UE as gross assist during functional task with min instructional cues.  Skilled Therapeutic Interventions/Progress Updates:    1:1 self care retraining. When entered the room pt needing to use the restroom urgently. NT transfering pt on STEDY- therapy began and finished assisting pt. Focus on task sequence and organization with mod cuing and extra time to process information. Focus on sit to stands from toilet, in shower and at sink from w/c with attention to right LE placement, ability for foot to maintain contact with floor and glut and thigh mm contraction for hip and knee active extension. Pt did require max A to maintain standing without UE support while wash periarea in shower and mod A for clothing management standing at sink without UE support. Pt difficulty with attending to multiple motor commands at one time. Pt with increased right UE function today with elbow extension and flexion with extra time and visual attention to UE. Mod cuing for utilization of hemi dressing techniques. Pt does continued to demonstrate apraxia with dressing but with increased ability to self correct errors with multimodal cuing.  [name] present to observe session.   Therapy Documentation Precautions:  Precautions Precautions: Fall Precaution Comments: R flexor synergy in LE during gait; motor apraxia; R hemiplegia; R  inattention Restrictions Weight Bearing Restrictions: No General:   Vital Signs: Therapy Vitals Temp: 98.7 F (37.1 C) Temp Source: Oral Pulse Rate: 94 Resp: 20 BP: 91/72 mmHg Patient Position (if appropriate): Sitting Oxygen Therapy SpO2: 98 % O2 Device: Not Delivered Pain: No reports in session  Function:     Grooming Oral Care,Brush Teeth, Clean Dentures Activity:             Wash, Rinse, Dry Face Activity   Assist Level: Set up;Supervision or verbal cues   Set up : To obtain items  Wash, Rinse, Dry Hands Activity   Assist Level: Set up;Supervision or verbal cues   Set up : To obtain items  Brush, Comb Hair Activity        Shave Activity          Apply Makeup Activity                                                             Bathing Bathing position   Position: Shower  Bathing parts Body parts bathed by patient: Right arm;Chest;Abdomen;Front perineal area;Right upper leg;Left upper leg (mod A) Body parts bathed by helper: Right lower leg;Left lower leg;Back;Buttocks  Bathing assist Assist Level: Supervision or verbal cues;Touching or steadying assistance(Pt > 75%)   Set up : To obtain items;To open containers   Upper Body Dressing/Undressing Upper body  dressing   What is the patient wearing?: Bra;Pull over shirt/dress Bra - Perfomed by patient: Thread/unthread left bra strap Bra - Perfomed by helper: Thread/unthread right bra strap;Hook/unhook bra (pull down sports bra) Pull over shirt/dress - Perfomed by patient: Thread/unthread right sleeve;Thread/unthread left sleeve;Put head through opening;Pull shirt over trunk          Upper body assist Assist Level: More than reasonable time;Set up   Set up : To obtain clothing/put away   Lower Body Dressing/Undressing Lower body dressing   What is the patient wearing?: Underwear;Pants;Socks;Shoes Underwear - Performed by patient: Thread/unthread right underwear leg;Thread/unthread left underwear  leg Underwear - Performed by helper: Pull underwear up/down Pants- Performed by patient: Thread/unthread right pants leg;Thread/unthread left pants leg;Pull pants up/down       Socks - Performed by patient: Don/doff right sock;Don/doff left sock   Shoes - Performed by patient: Don/doff left shoe Shoes - Performed by helper: Don/doff right shoe;Fasten right;Fasten left          Lower body assist Assist Level: Touching or steadying assistance (Pt > 75%);Supervision or verbal cues   Set up : To obtain clothing/put away   Toileting Toileting Toileting activity did not occur: Safety/medical concerns Toileting steps completed by patient:  (only 2 steps- doffed clothing) Toileting steps completed by helper: Adjust clothing prior to toileting;Performs perineal hygiene (only 2 steps doffed clothing) Toileting Assistive Devices: Grab bar or rail  Toileting assist Assist level: Touching or steadying assistance (Pt.75%);Supervision or verbal cues;Set up/obtain supplies     Transfers       Toilet transfer   Toilet transfer assistive device: Bedside commode;Grab bar     Mechanical lift: Stedy Assist level to toilet: Moderate assist (Pt 50 - 74%/lift or lower) Assist level from toilet: Moderate assist (Pt 50 - 74%/lift or lower)  Tub/shower transfer   Tub/shower assistive device: Tub transfer bench;Walk in shower;Grab bars   Assist level into tub: Moderate assist (Pt 50 - 74%/lift or lower/lift 2 legs) Assist level out of tub: Moderate assist (Pt 50 - 74%/lift or lower/lift 2 legs)   Cognition Comprehension Comprehension assist level: Follows basic conversation/direction with extra time/assistive device  Expression Expression assist level: Expresses basic 75 - 89% of the time/requires cueing 10 - 24% of the time. Needs helper to occlude trach/needs to repeat words.  Social Interaction Social Interaction assist level: Interacts appropriately 90% of the time - Needs monitoring or  encouragement for participation or interaction.  Problem Solving Problem solving assist level: Solves basic 50 - 74% of the time/requires cueing 25 - 49% of the time  Memory Memory assist level: Recognizes or recalls 50 - 74% of the time/requires cueing 25 - 49% of the time    Therapy/Group: Individual Therapy  Ashley [name] [facility] 06/06/2015, 2:58 PM

## 2015-06-06 NOTE — Progress Notes (Signed)
Physical Therapy Session Note  Patient Details  Name: Ashley [name] MRN: [id number] Date of Birth: 28-Mar-1976  Today's Date: 06/06/2015 PT Individual Time: 1000-1130 Treatment Session 2: 1530-1600 PT Individual Time Calculation (min): 90 min Treatment Session 2: 30 min  Short Term Goals: Week 1:  PT Short Term Goal 1 (Week 1): Pt will transfer w/c to/from bed req min A via stand-step transfer to pt's L. PT Short Term Goal 2 (Week 1): Pt will demonstrate rolling L in flat bed req min A without rails PT Short Term Goal 3 (Week 1): Pt will ambulate >= 50' with Eva walker req 1-2 person assist PT Short Term Goal 4 (Week 1): Pt will demonstrate appropriate sequencing with min cues for sit to stand req min A PT Short Term Goal 5 (Week 1): Pt will initiate stair training during PT session.   Skilled Therapeutic Interventions/Progress Updates:    Treatment Session 1: Pt received up in recliner chair with mom present for PT session - agreeable to BWSTT. Gait Training - Pt completes a total of 397' for a total of 7 minutes with speeds ranging from 0.5 to 0.9 - PT assists pt's R leg (increasing flexor synergy with increasing speed) while COTA assists with lateral weight shift and pelvic rotation - 1 seated and 1 standing rest break required during this time. Therapeutic Activity - see below for details. PT instructs pt in repeated sit to stand without assistive device, using mirror for visual feedback, focus on sequencing and normal movement patterns with PT facilitating at R hip and distal R thigh x 10 reps - pt able to stand with min-mod A and able to demonstrate static stand balance req min A-close SBA prn. W/C Management - see below for details, use of L hemi technique.   Treatment Session 2: Pt received up in recliner with husband and mom present. Therapeutic Activity - Mod A sit to stand and stand step transfer recliner to bed. Min A for R LE sit to/from supine via long sit. Neuromuscular Reeducation  - While in supine, PT instructs pt in PNF diagonals D1 and D2 to R UE and D2 to R LE x 10 reps each - AAROM to min A as needed. Pt continues to req repeated cues for sequencing. Pt ended eob and passed off to nurse tech to transfer pt to toilet with Stedy.   Pt is progressing with mobility and R side motor return. BWSTT was very challenging for pt, but she enjoyed it. Pt's R LE flexor tone is interfering with ambulation ability. Continue per PT POC.   Therapy Documentation Precautions:  Precautions Precautions: Fall Precaution Comments: R flexor synergy in LE during gait; motor apraxia; R hemiplegia; R inattention Restrictions Weight Bearing Restrictions: No Pain: Pain Assessment Pain Assessment: Faces Pain Score: 6  Pain Type: Acute pain Pain Location: Knee Pain Orientation: Right Pain Descriptors / Indicators: Aching;Burning Pain Frequency: Constant Pain Onset: With Activity Patients Stated Pain Goal: 2 Pain Intervention(s): Rest;Repositioned Multiple Pain Sites: No Treatment Session 2: Pt denies pain  Function:  Toileting Toileting Toileting activity did not occur: Safety/medical concerns Toileting steps completed by patient: Performs perineal hygiene;Adjust clothing after toileting Toileting steps completed by helper: Adjust clothing prior to toileting Toileting Assistive Devices: Grab bar or rail Assist level: More than reasonable time;Touching or steadying assistance (Pt.75%)   Bed Mobility Roll left and right activity        Sit to lying activity        Lying to sitting  activity        Mobility details     Transfers Sit to stand transfer   Sit to stand assist level: Moderate assist (Pt 50 - 74%/lift 2 legs/lift or lower) Sit to stand assistive device: Armrests  Chair/bed transfer   Chair/bed transfer method: Stand pivot Chair/bed transfer assist level: Moderate assist (Pt 50 - 74%/lift or lower) Chair/bed transfer assistive device: Armrests   Chair/bed  transfer details: Manual facilitation for weight shifting;Manual facilitation for placement;Verbal cues for sequencing;Visual cues/gestures for sequencing   Toilet transfer   Toilet transfer assistive device: Grab bar Mechanical lift: Stedy Assist level to toilet: Touching or steadying assistance (Pt > 75%) Assist level from toilet: Touching or steadying assistance (Pt > 75%)      Car transfer          Locomotion Ambulation   Assistive device: Lite gait Max distance: 397    Walk 10 feet activity   Assist level: 2 helpers  Walk 50 feet with 2 turns activity      Walk 150 feet activity   Assist level: 2 helpers  Walk 10 feet on uneven surfaces activity      Stairs          Walk up/down 1 step activity        Walk up/down 4 steps activity      Walk up/down 12 steps activity      Pick up small objects from floor      Wheelchair   Type: Manual Max wheelchair distance: 300 Assist Level: Supervision or verbal cues  Wheel 50 feet with 2 turns activity   Assist Level: Supervision or verbal cues  Wheel 150 feet activity   Assist Level: Supervision or verbal cues   Cognition Comprehension Comprehension assist level: Follows basic conversation/direction with extra time/assistive device  Expression Expression assist level: Expresses basic 90% of the time/requires cueing < 10% of the time.  Social Interaction Social Interaction assist level: Interacts appropriately 90% of the time - Needs monitoring or encouragement for participation or interaction.  Problem Solving Problem solving assist level: Solves basic 50 - 74% of the time/requires cueing 25 - 49% of the time  Memory Memory assist level: Recognizes or recalls 50 - 74% of the time/requires cueing 25 - 49% of the time    Therapy/Group: Individual Therapy with Herminio Heads as +2   [facility] M 06/06/2015, 11:38 AM

## 2015-06-06 NOTE — IPOC Note (Signed)
Overall Plan of Care [facility]) Patient Details Name: Ashley [name] MRN: [id number] DOB: November 20, 1975  Admitting Diagnosis: ICH Seizure   Hospital Problems: Principal Problem:   Hemiplga fol ntrm intcrbl hemor aff right dominant side Active Problems:   Aphasia following nontraumatic intracerebral hemorrhage   Left-sided intracerebral hemorrhage   Seizure disorder as sequela of cerebrovascular accident     Functional Problem List: Nursing    PT Balance, Behavior, Edema, Endurance, Motor, Pain, Perception, Safety, Sensory  OT Balance, Cognition, Endurance, Motor, Safety, Perception, Sensory, Pain  SLP Cognition, Nutrition, Linguistic  TR         Basic ADL's: OT Eating, Grooming, Bathing, Dressing, Toileting     Advanced  ADL's: OT       Transfers: PT Bed Mobility, Bed to Chair, Car, [employer], Futures trader, Metallurgist: PT Ambulation, Emergency planning/management officer, Stairs     Additional Impairments: OT Fuctional Use of Upper Extremity  SLP Swallowing, Communication, Social Cognition comprehension, expression Problem Solving, Memory, Awareness, Attention  TR      Anticipated Outcomes Item Anticipated Outcome  Self Feeding Supervision  Swallowing  Mod I    Basic self-care  Management consultant Transfers Min Assist  Bowel/Bladder     Transfers  supervision  Locomotion  supervision limited household distances; mod I w/c propulsion  Communication  Supervision   Cognition  Supervision   Pain     Safety/Judgment      Therapy Plan: PT Intensity: Minimum of 1-2 x/day ,45 to 90 minutes PT Frequency: 5 out of 7 days PT Duration Estimated Length of Stay: 3 weeks OT Intensity: Minimum of 1-2 x/day, 45 to 90 minutes OT Frequency: 5 out of 7 days OT Duration/Estimated Length of Stay: 3 weeks SLP Intensity: Minumum of 1-2 x/day, 30 to 90 minutes SLP Frequency: 3 to 5 out of 7 days SLP Duration/Estimated Length of Stay: 14-21 days         Team Interventions: Nursing Interventions    PT interventions Ambulation/gait training, Discharge planning, Functional mobility training, Psychosocial support, Therapeutic Activities, Balance/vestibular training, Disease management/prevention, Neuromuscular re-education, Therapeutic Exercise, Wheelchair propulsion/positioning, Cognitive remediation/compensation, DME/adaptive equipment instruction, Pain management, Splinting/orthotics, UE/LE Strength taining/ROM, Community reintegration, Technical sales engineer stimulation, Barrister's clerk education, IT trainer, UE/LE Coordination activities  OT Interventions Training and development officer, Cognitive remediation/compensation, Discharge planning, Disease mangement/prevention, DME/adaptive equipment instruction, Functional mobility training, Functional electrical stimulation, Neuromuscular re-education, Pain management, Patient/family education, Psychosocial support, Self Care/advanced ADL retraining, Splinting/orthotics, Therapeutic Activities, Therapeutic Exercise, Visual/perceptual remediation/compensation, Wheelchair propulsion/positioning, UE/LE Coordination activities, UE/LE Strength taining/ROM  SLP Interventions Cognitive remediation/compensation, Cueing hierarchy, Dysphagia/aspiration precaution training, Internal/external aids, Environmental controls, Functional tasks, Speech/Language facilitation, Patient/family education, Multimodal communication approach  TR Interventions    SW/CM Interventions      Team Discharge Planning: Destination: PT-Home ,OT- Home , SLP-Home Projected Follow-up: PT-24 hour supervision/assistance, Home health PT, OT-  Home health OT, 24 hour supervision/assistance, SLP-24 hour supervision/assistance, Home Health SLP, Outpatient SLP Projected Equipment Needs: PT-To be determined, OT- To be determined, SLP-None recommended by SLP Equipment Details: PT- , OT-  Patient/family involved in discharge planning: PT-  Patient, Family member/caregiver,  OT-Patient, SLP-Patient  MD ELOS: 14-20d Medical Rehab Prognosis:  Excellent Assessment: 39 y.o. Postpartum female admitted on 05/20/15 with complaints of HA since delivery She was admitted on 05/21/15 with right facial droop with aphasia, right sided weakness and seizures. She was found to have acute large left fronto-parietal IPH/SAH and was started on Keppra for  treatment. She developed vasogenic edema and was treated with hypertonic saline and fallow up CCT 08/12 showed stability. Work up without definite cerebral venous sinus/cortical vein thrombosis and no anticoagulation needed. Patient with resultant dense left hemiplegia, aphasia as well as dysphagia and was admitted to CIR on 05/28/15 for rehab. She has had issues with sleep wake disruption as well as anxiety which has improved with addition of Seroquel. Foley was discontinued on 08/16 and she was started on Keflex for Klebsiella UTI. With increase in activity level, she has had worsening of RLE spasms and Zanaflex was added and titrated to bid to help with symptoms. Blood pressures were controlled and po intake had improved. On 06/02/15 am, patient developed seizures with obtundation. She was noted to be severely hypotensive and was treated with fluid bolus as well as IV Keppra and IV antibiotics due to concerns of sepsis   Now requiring 24/7 Rehab RN,MD, as well as CIR level PT, OT and SLP.  Treatment team will focus on ADLs and mobility with goals set at supervision  See Team Conference Notes for weekly updates to the plan of care

## 2015-06-07 ENCOUNTER — Inpatient Hospital Stay (HOSPITAL_COMMUNITY): Payer: BLUE CROSS/BLUE SHIELD | Admitting: Speech Pathology

## 2015-06-07 ENCOUNTER — Inpatient Hospital Stay (HOSPITAL_COMMUNITY): Payer: BLUE CROSS/BLUE SHIELD

## 2015-06-07 ENCOUNTER — Encounter (HOSPITAL_COMMUNITY): Payer: BLUE CROSS/BLUE SHIELD

## 2015-06-07 ENCOUNTER — Inpatient Hospital Stay (HOSPITAL_COMMUNITY): Payer: BLUE CROSS/BLUE SHIELD | Admitting: Physical Therapy

## 2015-06-07 ENCOUNTER — Inpatient Hospital Stay (HOSPITAL_COMMUNITY): Payer: BLUE CROSS/BLUE SHIELD | Admitting: Occupational Therapy

## 2015-06-07 DIAGNOSIS — F4321 Adjustment disorder with depressed mood: Secondary | ICD-10-CM

## 2015-06-07 LAB — CULTURE, BLOOD (ROUTINE X 2)
CULTURE: NO GROWTH
Culture: NO GROWTH

## 2015-06-07 MED ORDER — FLUOXETINE HCL 10 MG PO CAPS
10.0000 mg | ORAL_CAPSULE | Freq: Every day | ORAL | Status: DC
  Administered 2015-06-07 – 2015-06-26 (×20): 10 mg via ORAL
  Filled 2015-06-07 (×24): qty 1

## 2015-06-07 NOTE — Progress Notes (Signed)
Physical Therapy Session Note  Patient Details  Name: Ashley [name] MRN: [id number] Date of Birth: 02-06-1976  Today's Date: 06/07/2015 PT Individual Time: 1000-1100 PT Individual Time Calculation (min): 60 min   Short Term Goals: Week 1:  PT Short Term Goal 1 (Week 1): Pt will transfer w/c to/from bed req min A via stand-step transfer to pt's L. PT Short Term Goal 2 (Week 1): Pt will demonstrate rolling L in flat bed req min A without rails PT Short Term Goal 3 (Week 1): Pt will ambulate >= 50' with Eva walker req 1-2 person assist PT Short Term Goal 4 (Week 1): Pt will demonstrate appropriate sequencing with min cues for sit to stand req min A PT Short Term Goal 5 (Week 1): Pt will initiate stair training during PT session.   Skilled Therapeutic Interventions/Progress Updates:    Pt received up in w/c with mother present throughout PT session. W/C Management - see below for details. Pt completes 2 bouts of 150', mostly SBA with less lateral deviations and faster speed, but one episode req min A due to R side inattention with hallway slightly narrower due to door being propped open. Gait Training - see below for details. PT notes markedly less R LE flexor synergy tone, pt demonstrates intermittent ability to initiate R leg swing, but PT notes scissoring pattern during swing, however, pt is able to correct and abduct leg for improved foot placement. PT guards R knee in stance - pt occasionally demonstrates crouched R knee positioning, but intermittent uncontrolled R knee hyperextension in stance phase. Pt req assist to manage Harmon Pier walker, and simple one step cues for sequencing ("walker, L leg, walker, R leg"). Pt initiates stair training during PT - see below for details: 1 + 2 + 3 + 4 steps with B rails: forward ascent, backwards descent. PT facilitates progression/regression of R arm on rail, assists in placement of R foot on/off step, blocks scissoring of R leg during backwards descent, and blocks  R knee in stance on descent to prevent buckling. Pt is progressing with all functional mobility and is close to attempting transitioning to a basic RW. Pt ended up in w/c with mom present and passed over to neuropsych professional. Continue per PT POC.   Therapy Documentation Precautions:  Precautions Precautions: Fall Precaution Comments: R flexor synergy in LE during gait; motor apraxia; R hemiplegia; R inattention Restrictions Weight Bearing Restrictions: No  Pain Assessment Pain Assessment: No/denies pain Pain Score: Asleep   Function:  Toileting Toileting   Toileting steps completed by patient: Performs perineal hygiene Toileting steps completed by helper: Adjust clothing prior to toileting;Adjust clothing after toileting Toileting Assistive Devices: Grab bar or rail Assist level: Touching or steadying assistance (Pt.75%)   Bed Mobility Roll left and right activity   Assist level: Touching or steadying assistance (Pt > 75%, lift 1 leg)    Sit to lying activity        Lying to sitting activity   Assist level: Touching or steadying assistance (Pt > 75%, lift 1 leg)    Mobility details     Transfers Sit to stand transfer   Sit to stand assist level: Moderate assist (Pt 50 - 74%/lift 2 legs/lift or lower) Sit to stand assistive device: Walker Harmon Pier walker)  Chair/bed transfer   Chair/bed transfer method: Ambulatory Chair/bed transfer assist level: 2 helpers Chair/bed transfer assistive device: Walker   Chair/bed transfer details: Manual facilitation for weight shifting;Manual facilitation for placement;Verbal cues for sequencing;Verbal cues for  technique;Manual facilitation for weight bearing;Verbal cues for safe use of DME/AE   Toilet transfer                Car transfer          Locomotion Ambulation   Assistive device: Ethelene Hal Max distance: 55 Assist level: 2 helpers (mod-max A from PT; +2 for w/c follow)  Walk 10 feet activity   Assist level: 2  helpers  Walk 50 feet with 2 turns activity      Walk 150 feet activity      Walk 10 feet on uneven surfaces activity      Stairs   Stairs assistive device: 2 hand rails Max number of stairs: 4 Stairs assist level: Moderate assist (Pt 50 - 74%)  Walk up/down 1 step activity     Walk up/down 1 step (curb) assist level: Moderate assist (Pt 50 - 74%)  Walk up/down 4 steps activity   Walk up/down 4 steps assist level: Moderate assist (Pt 50 - 74%)  Walk up/down 12 steps activity      Pick up small objects from floor      Wheelchair   Type: Manual Max wheelchair distance: 150 Assist Level: Touching or steadying assistance (Pt > 75%)  Wheel 50 feet with 2 turns activity   Assist Level: Touching or steadying assistance (Pt > 75%)  Wheel 150 feet activity   Assist Level: Touching or steadying assistance (Pt > 75%)   Cognition Comprehension Comprehension assist level: Follows basic conversation/direction with extra time/assistive device  Expression Expression assist level: Expresses basic 75 - 89% of the time/requires cueing 10 - 24% of the time. Needs helper to occlude trach/needs to repeat words.  Social Interaction Social Interaction assist level: Interacts appropriately 90% of the time - Needs monitoring or encouragement for participation or interaction.  Problem Solving Problem solving assist level: Solves basic 50 - 74% of the time/requires cueing 25 - 49% of the time  Memory Memory assist level: Recognizes or recalls 50 - 74% of the time/requires cueing 25 - 49% of the time    Therapy/Group: Individual Therapy   Shaena Parkerson M 06/07/2015, 10:56 AM

## 2015-06-07 NOTE — Progress Notes (Signed)
Social Work Patient ID: Ashley [name], female   DOB: 04/20/1976, 39 y.o.   MRN: [id number]   Have reviewed team conference with pt's husband who is aware and agreeable with targeted d/c date of 9/14 and supervision/ min assist goals.  He is very pleased with overall improvements she has shown since the weekend.  Reports good visit between older two children and pt on Sunday evening.  SW to continue following for support and d/c planning needs.  Tamu Golz, LCSW

## 2015-06-07 NOTE — Progress Notes (Signed)
Occupational Therapy Session Note  Patient Details  Name: Ashley [name] MRN: [id number] Date of Birth: [birth year]  Today's Date: 06/07/2015 OT Individual Time: 0730-0900 OT Individual Time Calculation (min): 90 min   Short Term Goals: Week 1:  OT Short Term Goal 1 (Week 1): Pt will sequence bathing 10 out of 10 parts with min questioning cues OT Short Term Goal 2 (Week 1): Pt will complete toilet transfer with min assist to place right L/UE OT Short Term Goal 3 (Week 1): Pt will complete sit>stand in prep for lower body B & D with max  OT Short Term Goal 4 (Week 1): Pt will use right UE as gross assist during functional task with min instructional cues.  Skilled Therapeutic Interventions/Progress Updates: ADL-retraining at shower level with focus on improved transfer skills, sequencing, dynamic standing balance and adapted bathing/dressing skills.   Pt able to complete bed mobility unassisted with extra time, bed flat and rails down using abdominals to flex trunk.   Pt progressed to stand-pivot transfers with mod assist to lift and maintain balance @ bed<>w/c and w/c<>tub bench using grab bars at walk-in shower.   Pt requires mod vc to sequence but sustains attention through task with intermittent cues to attend to right side.   Pt dresses seated at sink in w/c and required assist to dress right side although able to lace right arm partially during this session.  Pt stands at sink to pull up underwear and pants with steadying assist and min assist to pull pants over right hip and manual facilitation w/cues to place, weight-shift and maintain right foot flat on floor while standing.   Pt donned socks and shoes but deferred tying shoes due to right UE hemiparesis; OT educated pt on potential use of elastic laces and/or learning one-handed tying method.     Therapy Documentation Precautions:  Precautions Precautions: Fall Precaution Comments: R flexor synergy in LE during gait; motor apraxia; R  hemiplegia; R inattention Restrictions Weight Bearing Restrictions: No   Vital Signs: Therapy Vitals Temp: 98.5 F (36.9 C) Temp Source: Oral Pulse Rate: 80 Resp: 18 BP: 105/68 mmHg Patient Position (if appropriate): Lying Oxygen Therapy SpO2: 98 % O2 Device: Not Delivered   Pain: Pain Assessment Pain Assessment: 0-10  ADL: ADL ADL Comments: see Functional Assessment  Function:   Eating Eating   Eating Assist Level: Set up assist for           Grooming Oral Care,Brush Teeth, Clean Dentures Activity:          Set up : To apply toothpaste  Wash, Rinse, Dry Face Activity   Assist Level: Set up      Wash, Rinse, Dry Hands Activity   Assist Level: Touching or steadying assistance(Pt > 75%)      Brush, Comb Hair Activity   Assist Level: Set up to obtain items    Shave Activity          Apply Makeup Activity                                                             Bathing Bathing position   Position: Shower  Bathing parts Body parts bathed by patient: Right arm;Chest;Abdomen;Front perineal area;Buttocks;Right upper leg;Left upper leg;Right lower leg;Left lower leg Body parts bathed by helper:  Back;Left arm  Bathing assist Assist Level: Touching or steadying assistance(Pt > 75%)       Upper Body Dressing/Undressing Upper body dressing  What is the patient wearing?: Bra;Pull over shirt/dress Bra - Perfomed by patient: Thread/unthread left bra strap;Hook/unhook bra (pull down sports bra) Bra - Perfomed by helper: Thread/unthread right bra strap Pull over shirt/dress - Perfomed by patient: Thread/unthread left sleeve;Put head through opening;Pull shirt over trunk Pull over shirt/dress - Perfomed by helper: Thread/unthread right sleeve   Upper body assist Assist Level: Touching or steadying assistance(Pt > 75%)    Lower Body Dressing/Undressing Lower body dressing   What is the patient wearing?: Pants;Underwear;Socks;Shoes Underwear -  Performed by patient: Thread/unthread left underwear leg;Thread/unthread right underwear leg;Pull underwear up/down   Pants- Performed by patient: Thread/unthread right pants leg;Thread/unthread left pants leg;Pull pants up/down       Socks - Performed by patient: Don/doff right sock;Don/doff left sock   Shoes - Performed by patient: Don/doff right shoe;Don/doff left shoe Shoes - Performed by helper: Fasten right;Fasten left          Lower body assist Assist Level: Touching or steadying assistance (Pt > 75%)       Toileting Toileting          Toileting assist      Bed Mobility Roll left and right activity   Assist level: Touching or steadying assistance (Pt > 75%, lift 1 leg)  Sit to lying activity      Lying to sitting activity   Assist level: Touching or steadying assistance (Pt > 75%, lift 1 leg)  Mobility details     Transfers Sit to stand transfer   Sit to stand assist level: Moderate assist (Pt 50 - 74%/lift 2 legs/lift or lower)    Chair/bed transfer              Toilet transfer                Tub/shower transfer   Tub/shower assistive device: Tub transfer bench;Grab bars;Walk in shower   Assist level into tub: Moderate assist (Pt 50 - 74%/lift or lower/lift 2 legs) Assist level out of tub: Moderate assist (Pt 50 - 74%/lift or lower/lift 2 legs)   Cognition Comprehension    Expression    Social Interaction    Problem Solving    Memory      Therapy/Group: Individual Therapy  Zhana Jeangilles 06/07/2015, 8:57 AM

## 2015-06-07 NOTE — Progress Notes (Signed)
Occupational Therapy Session Note  Patient Details  Name: Ashley [name] MRN: [id number] Date of Birth: 09/27/1976  Today's Date: 06/07/2015 OT Individual Time: 1330-1400 OT Individual Time Calculation (min): 30 min    Short Term Goals: Week 1:  OT Short Term Goal 1 (Week 1): Pt will sequence bathing 10 out of 10 parts with min questioning cues OT Short Term Goal 2 (Week 1): Pt will complete toilet transfer with min assist to place right L/UE OT Short Term Goal 3 (Week 1): Pt will complete sit>stand in prep for lower body B & D with max  OT Short Term Goal 4 (Week 1): Pt will use right UE as gross assist during functional task with min instructional cues.  Skilled Therapeutic Interventions/Progress Updates:    1:1 NMR with focus on transfer training to right and left with attention feet placement, maintaining forward weight shift, proper hand placement during transfer and ability to pivot hips during transfer with mod multimodal cuing. Also continued focus on NMR with right UE in sidelying and in supine. Pt with increased muscle activity in UE in all muscle groups in all planes with tactile cues for initiation and cues to visually attend to UE during movement. Pt able to demonstrate motor learning quickly during session with multimodal cuing (verbal, tactile, and gestural). Performed PNF in supine with assist against gravity but pt able to initiate all movements with extra time and follow through ROM with extra time. Pt able to relax tone in more distal ranges!!  Therapy Documentation Precautions:  Precautions Precautions: Fall Precaution Comments: R flexor synergy in LE during gait; motor apraxia; R hemiplegia; R inattention Restrictions Weight Bearing Restrictions: No Pain: Reports head ache but able to continue ADL: ADL ADL Comments: see Functional Assessment Exercises:   Other Treatments:    Function:    Cognition Comprehension Comprehension assist level: Follows basic  conversation/direction with extra time/assistive device  Expression Expression assist level: Expresses basic 75 - 89% of the time/requires cueing 10 - 24% of the time. Needs helper to occlude trach/needs to repeat words.  Social Interaction Social Interaction assist level: Interacts appropriately 90% of the time - Needs monitoring or encouragement for participation or interaction.  Problem Solving Problem solving assist level: Solves basic 50 - 74% of the time/requires cueing 25 - 49% of the time  Memory Memory assist level: Recognizes or recalls 50 - 74% of the time/requires cueing 25 - 49% of the time    Therapy/Group: Individual Therapy  Willeen Cass [facility] 06/07/2015, 3:45 PM

## 2015-06-07 NOTE — Progress Notes (Addendum)
Speech Language Pathology Daily Session Note  Patient Details  Name: Ashley [name] MRN: [id number] Date of Birth: [birth year]  Today's Date: 06/07/2015 SLP Individual Time: 0930-1000 SLP Individual Time Calculation (min): 30 min  Short Term Goals: Week 1: SLP Short Term Goal 1 (Week 1): Pt will consume her currently prescribed diet with mod I use of swallowing precautions.   SLP Short Term Goal 2 (Week 1): Pt will monitor and correct verbal errors during structured and unstructured tasks with min assist verbal cues.   SLP Short Term Goal 3 (Week 1): Pt will follow abstract, multi-step commands during functional tasks with min assist verbal cues in 75% of observable opportunities.  SLP Short Term Goal 4 (Week 1): Pt will return demonstration of at least 2 safety precautions during functional tasks with min assist verbal cues.  Skilled Therapeutic Interventions:  Session 1: Pt was seen for skilled ST targeting cognitive goals.  Upon arrival, pt was seated up in recliner, awake, tearful about missing her son's first day of school, but agreeable to participate in therapy, stating "I just want to forget about it."  SLP facilitated the session with a medication management task targeting functional reading comprehension of medication labels and problem solving.  Pt read labels for 100% accuracy with supervision faded to mod I.  Pt was also able to use a medication chart to organize pills of varying dosages and frequencies for >80% accuracy with min assist faded to supervision cues.  SLP also facilitated the session with a new learning task/card game targeting functional problem solving and comprehension of abstract commands.  Following initial instruction of task, pt planned and executed a problem solving strategy with max faded to min-mod assist verbal cues.  Will address activity as appropriate in afternoon therapy session to target recall of task. Continue per current plan of care.   Session 2: Attempted  to see pt for second ST therapy session.  Pt laying in bed, somnolent but briefly arousable to voice and light touch.  Per pt's mom, pt was pre-medicated prior to SLP's arrival which was likely contributing to pt's decreased arousal.  Pt initially verbalized willingness to try to participate in therapies but was unable to stay awake long enough to participate in any therapeutic activities.  As a result, pt missed 30 minutes of skilled ST.  Will resume treatment at next available appointment.    Function:  Eating Eating               Cognition Comprehension Comprehension assist level: Follows basic conversation/direction with extra time/assistive device  Expression   Expression assist level: Expresses basic 75 - 89% of the time/requires cueing 10 - 24% of the time. Needs helper to occlude trach/needs to repeat words.  Social Interaction Social Interaction assist level: Interacts appropriately 90% of the time - Needs monitoring or encouragement for participation or interaction.  Problem Solving Problem solving assist level: Solves basic 50 - 74% of the time/requires cueing 25 - 49% of the time  Memory Memory assist level: Recognizes or recalls 50 - 74% of the time/requires cueing 25 - 49% of the time    Pain Pain Assessment Pain Assessment: No/denies pain  Therapy/Group: Individual Therapy  Jacqui Headen, Selinda Orion 06/07/2015, 12:10 PM

## 2015-06-07 NOTE — Progress Notes (Signed)
Subjective/Complaints:  Pt without new issues overnite, just finished shower, no leg spasms noted this am. Had good BM after laxative yesterday per pt   ROS limited due to aphasia Objective: Vital Signs: Blood pressure 105/68, pulse 80, temperature 98.5 F (36.9 C), temperature source Oral, resp. rate 18, SpO2 98 %, unknown if currently breastfeeding. No results found. No results found for this or any previous visit (from the past 72 hour(s)).   HEENT: normal Cardio: RRR and no murmur Resp: CTA B/L and unlabored GI: BS positive and NT, ND Extremity:  BS positive and NT, ND Skin:   Central line site healed over Neuro: Alert/Oriented, Abnormal Sensory reduced sensation to LT and proprio RUE and RLE , Left side 4-5/5, Abnormal Motor 3-/5 RUE grip, 2- bi and tri , 0 at delt, 2- hip/knee ext RLE and Expressive Aphasic, Ashworth 2 at biceps femoris Musc/Skel:  Other no pain with UE or LE ROM Gen NAD   Assessment/Plan: 1.1. Functional deficits secondary to Right spastic hemiplegia,dysphagia, and aphasia secondary to large left parietal intracranial hemorrhage   which require 3+ hours per day of interdisciplinary therapy in a comprehensive inpatient rehab setting. Physiatrist is providing close team supervision and 24 hour management of active medical problems listed below. Physiatrist and rehab team continue to assess barriers to discharge/monitor patient progress toward functional and medical goals.   FIM: Function - Bathing Position: Shower Body parts bathed by patient: Right arm, Chest, Abdomen, Front perineal area, Right upper leg, Left upper leg (mod A) Body parts bathed by helper: Right lower leg, Left lower leg, Back, Buttocks Assist Level: Supervision or verbal cues, Touching or steadying assistance(Pt > 75%) Set up : To obtain items, To open containers  Function- Upper Body Dressing/Undressing What is the patient wearing?: Bra, Pull over shirt/dress Bra - Perfomed by  patient: Thread/unthread left bra strap Bra - Perfomed by helper: Thread/unthread right bra strap, Hook/unhook bra (pull down sports bra) Pull over shirt/dress - Perfomed by patient: Thread/unthread right sleeve, Thread/unthread left sleeve, Put head through opening, Pull shirt over trunk Pull over shirt/dress - Perfomed by helper: Pull shirt over trunk, Thread/unthread right sleeve Assist Level: More than reasonable time, Set up Set up : To obtain clothing/put away Function - Lower Body Dressing/Undressing What is the patient wearing?: Underwear, Pants, Socks, Shoes Position: Wheelchair/chair at sink (sit to stand) Underwear - Performed by patient: Thread/unthread right underwear leg, Thread/unthread left underwear leg Underwear - Performed by helper: Pull underwear up/down Pants- Performed by patient: Thread/unthread right pants leg, Thread/unthread left pants leg, Pull pants up/down Pants- Performed by helper: Pull pants up/down Non-skid slipper socks- Performed by patient: Don/doff right sock, Don/doff left sock Socks - Performed by patient: Don/doff right sock, Don/doff left sock Shoes - Performed by patient: Don/doff left shoe Shoes - Performed by helper: Don/doff right shoe, Fasten right, Fasten left Assist Level: Touching or steadying assistance (Pt > 75%), Supervision or verbal cues Set up : To obtain clothing/put away  Function - Toileting Toileting activity did not occur: Safety/medical concerns Toileting steps completed by patient: Adjust clothing prior to toileting, Performs perineal hygiene Toileting steps completed by helper: Adjust clothing after toileting Toileting Assistive Devices: Grab bar or rail Assist level: Touching or steadying assistance (Pt.75%)  Function - Toilet Transfers Toilet transfer assistive device: Bedside commode, Grab bar Mechanical lift: Stedy Assist level to toilet: Touching or steadying assistance (Pt > 75%) Assist level from toilet: Touching or  steadying assistance (Pt > 75%)  Function -  Chair/bed transfer Chair/bed transfer method: Stand pivot Chair/bed transfer assist level: Moderate assist (Pt 50 - 74%/lift or lower) Chair/bed transfer assistive device: Armrests Chair/bed transfer details: Manual facilitation for weight shifting, Manual facilitation for placement, Verbal cues for sequencing, Visual cues/gestures for sequencing  Function - Locomotion: Wheelchair Will patient use wheelchair at discharge?: Yes Type: Manual Max wheelchair distance: 300 Assist Level: Supervision or verbal cues Assist Level: Supervision or verbal cues Assist Level: Supervision or verbal cues Function - Locomotion: Ambulation Assistive device: Lite gait Max distance: 397 Assist level: 2 helpers (max x 1, +2 for w/c follow) Assist level: 2 helpers Walk 50 feet with 2 turns activity did not occur: Safety/medical concerns Walk 150 feet activity did not occur: Safety/medical concerns Assist level: 2 helpers Walk 10 feet on uneven surfaces activity did not occur: Safety/medical concerns  Function - Comprehension Comprehension: Auditory Comprehension assist level: Follows basic conversation/direction with extra time/assistive device  Function - Expression Expression: Verbal Expression assist level: Expresses basic 75 - 89% of the time/requires cueing 10 - 24% of the time. Needs helper to occlude trach/needs to repeat words.  Function - Social Interaction Social Interaction assist level: Interacts appropriately 90% of the time - Needs monitoring or encouragement for participation or interaction.  Function - Problem Solving Problem solving assist level: Solves basic 50 - 74% of the time/requires cueing 25 - 49% of the time  Function - Memory Memory assist level: Recognizes or recalls 50 - 74% of the time/requires cueing 25 - 49% of the time Patient normally able to recall (first 3 days only): Location of own room, Staff names and faces, That he  or she is in a hospital, Current season  Medical Problem List and Plan: 1. Functional deficits secondary to Right flaccid hemiplegia,dysphagia, and aphasia secondary to large left parietal intracranial hemorrhage, also severe cog def, spasticity , no Zanaflex due to hypotension, trial baclofen  2. DVT Prophylaxis/Anticoagulation: Pharmaceutical: Lovenox 3. Pain Management: Tylenol prn 4. Mood: LCSW to follow for evaluation and support as cognition/aphasia improves.  5. Neuropsych: This patient is not capable of making decisions on her own behalf. 6. Skin/Wound Care: Routine pressure relief measures.  7. Fluids/Electrolytes/Nutrition: Monitor I/O. 50-80% meals yesterday, 365ml po fluids 8. Insomnia improved on trazodone 9. New onset seizures: with breakthrough now controlled on keppra 1500mg  bid., monitor valproate level , 10.UTI , Kleb P seen on urine culture, on Bactrim, follow-up culture negative,, finish out 5 day course total abx, was on Keflex which was also sensitive LOS (Days) 3 A FACE TO FACE EVALUATION WAS PERFORMED  Ashley [name] E 06/07/2015, 8:15 AM   .akp

## 2015-06-07 NOTE — Patient Care Conference (Signed)
Inpatient RehabilitationTeam Conference and Plan of Care Update Date: 06/06/2015   Time: 11:45 AM    Patient Name: Ashley [name]      Medical Record Number: [id number]  Date of Birth: 12-07-1975 Sex: Female         Room/Bed: 4W16C/4W16C-01 Payor Info: Payor: [place] / Plan: [employer] OTHER / Product Type: *No Product type* /    Admitting Diagnosis: ICH Seizure   Admit Date/Time:  06/04/2015  5:04 PM Admission Comments: No comment available   Primary Diagnosis:  Hemiplga fol ntrm intcrbl hemor aff right dominant side Principal Problem: Hemiplga fol ntrm intcrbl hemor aff right dominant side  Patient Active Problem List   Diagnosis Date Noted  . Hemiplga fol ntrm intcrbl hemor aff right dominant side 06/06/2015  . Left-sided intracerebral hemorrhage 06/04/2015  . Seizure disorder as sequela of cerebrovascular accident   . Seizure disorder 06/03/2015  . Sepsis 06/03/2015  . UTI (urinary tract infection) 06/03/2015  . Hypotension 06/02/2015  . Right spastic hemiparesis 05/28/2015  . 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 History of anxiety disorder 05/28/2015  . ICH (intracerebral hemorrhage)   . Seizure disorder, nonconvulsive, with status epilepticus   . Cerebral venous thrombosis of cortical vein   . Cytotoxic cerebral edema   . IVH (intraventricular hemorrhage)   . Term pregnancy 05/07/2015  . Spontaneous vaginal delivery 05/07/2015    Expected Discharge Date: Expected Discharge Date: 06/27/15  Team Members Present: Physician leading conference: Dr. Alysia Penna Social Worker Present: Lennart Pall, LCSW Nurse Present: Heather Roberts, RN PT Present: Guilford Shi, PT OT Present: Willeen Cass, OT SLP Present: Windell Moulding, SLP PPS Coordinator present : Daiva Nakayama, RN, CRRN     Current Status/Progress Goal Weekly Team Focus  Medical   Had a seizure transferred to acute, now back to resume program  Home with family  toileting  program   Bowel/Bladder   Continent to bowel and bladder.LBM 8/24.  To continue continent to B & B with min. Assisst.  To continue toilleting Q 2-3 hrs.   Swallow/Nutrition/ Hydration   Regular textures, thin liquids; full supervision for use of swallowing precautions due to cognition   mod I   upgrade to intermittent supervision per improved cognition    ADL's   mod to max A overall for transfers and ADLs  Overall supervision- Min A for BADL,   attention to right, NMR of right UE, sitting balance, memory/ awareness/ sequencing, transfers, family education    Mobility   mod A stand-pivot transfer; +2 assist ambulation with L wall rail/EVA walker/Lite Gait  min A - SBA  sit to stands, transfers, progressive gait, R side NMR   Communication   mild expressive >receptive aphasia, perseveration, phonemic paraphasic errors, improved awareness of errors, word finding deficits    min assist   address awareness of verbal errors and use of compensatory word finding strategies.    Safety/Cognition/ Behavioral Observations  moderate deficits, right inattention, poor safety awareness with slight impulsivity   min assist   safety awareness and semi-complex functional problem solving    Pain   Complain about headache,using Tylenol 650 mg. Q 4 hrs.  To keep pain levels less than 3,on scale 1 to 10.  To assess for pain Q 2-3 hrs,and PRN.   Skin   Skin dry and intact.Some bruises around abdomen.  To keep skin free of breakdown.  To assess skin Q shift.    Rehab Goals Patient on target to meet  rehab goals: Yes *See Care Plan and progress notes for long and short-term goals.  Barriers to Discharge: Severe weakness and aphasia    Possible Resolutions to Barriers:  NM ed, speech for cognition    Discharge Planning/Teaching Needs:  Home with husband and her mom to assist, here daily      Team Discussion:  Returned to CIR following brief acute stay due to seizure and hypotensive episode.  Good  physical recoveery past few days.  Improvements in physical, cognitive and language overal.  More aware of verbal errors.  Working extremely hard and showing good carryover.  Goals set for supervision overall.  Family remains very involve  Revisions to Treatment Plan:  Some initial goals upgraded   Continued Need for Acute Rehabilitation Level of Care: The patient requires daily medical management by a physician with specialized training in physical medicine and rehabilitation for the following conditions: Daily direction of a multidisciplinary physical rehabilitation program to ensure safe treatment while eliciting the highest outcome that is of practical value to the patient.: Yes Daily medical management of patient stability for increased activity during participation in an intensive rehabilitation regime.: Yes Daily analysis of laboratory values and/or radiology reports with any subsequent need for medication adjustment of medical intervention for : Neurological problems;Other  Ashley [name] 06/07/2015, 10:52 AM

## 2015-06-08 ENCOUNTER — Inpatient Hospital Stay (HOSPITAL_COMMUNITY): Payer: BLUE CROSS/BLUE SHIELD | Admitting: Speech Pathology

## 2015-06-08 ENCOUNTER — Inpatient Hospital Stay (HOSPITAL_COMMUNITY): Payer: BLUE CROSS/BLUE SHIELD | Admitting: Physical Therapy

## 2015-06-08 ENCOUNTER — Inpatient Hospital Stay (HOSPITAL_COMMUNITY): Payer: BLUE CROSS/BLUE SHIELD | Admitting: Occupational Therapy

## 2015-06-08 NOTE — Progress Notes (Signed)
Speech Language Pathology Daily Session Note  Patient Details  Name: Ashley [name] MRN: [id number] Date of Birth: 08-Dec-1975  Today's Date: 06/08/2015 SLP Individual Time: 0900-1000 SLP Individual Time Calculation (min): 60 min  Short Term Goals: Week 1: SLP Short Term Goal 1 (Week 1): Pt will consume her currently prescribed diet with mod I use of swallowing precautions.   SLP Short Term Goal 2 (Week 1): Pt will monitor and correct verbal errors during structured and unstructured tasks with min assist verbal cues.   SLP Short Term Goal 3 (Week 1): Pt will follow abstract, multi-step commands during functional tasks with min assist verbal cues in 75% of observable opportunities.  SLP Short Term Goal 4 (Week 1): Pt will return demonstration of at least 2 safety precautions during functional tasks with min assist verbal cues.  Skilled Therapeutic Interventions:  Pt was seen for skilled ST targeting cognitive goals.  Upon arrival, pt was seated upright in recliner, awake, alert, and agreeable to participate in [place].  Pt recalled missing yesterday's therapy session and apologized for being unable to participate.  Pt was able to recall 2 out of 3 rules from yesterday's previously taught card game independently, improving to 3 out of 3 recall with self initiated request for use of written aid provided during previous treatment.  Pt planned and executed a problem solving strategy during the abovementioned task with min assist verbal cues and extra time for working memory and thought organization.  Minimal word finding deficits noted in conversations with SLP and pt self corrected phonemic paraphasic errors with mod I in >80% of occurrences.   SLP also initiated the session with a semi-complex medication management task targeting recall of new information. Pt recalled function of her currently scheduled medications when named in 3 out of 4 opportunities with supervision.  SLP created a written list of pt's  medications to facilitate daily recall of information.  Recommend addressing higher level problem solving for medication management with the use of a pill box at next available appointment. At the end of today's session, pt was left in recliner with call bell within reach and quick release belt donned for safety . Continue per current plan of care.    Function:  Eating Eating                 Cognition Comprehension Comprehension assist level: Follows basic conversation/direction with extra time/assistive device  Expression   Expression assist level: Expresses basic 75 - 89% of the time/requires cueing 10 - 24% of the time. Needs helper to occlude trach/needs to repeat words.  Social Interaction Social Interaction assist level: Interacts appropriately 90% of the time - Needs monitoring or encouragement for participation or interaction.  Problem Solving Problem solving assist level: Solves basic 50 - 74% of the time/requires cueing 25 - 49% of the time  Memory Memory assist level: Recognizes or recalls 50 - 74% of the time/requires cueing 25 - 49% of the time    Pain Pain Assessment Pain Assessment: No/denies pain   Therapy/Group: Individual Therapy  Benjamin Casanas, Selinda Orion 06/08/2015, 11:55 AM

## 2015-06-08 NOTE — Progress Notes (Signed)
Subjective/Complaints:  amb to BR with pt, has equinovarus spasticity at R ankle   ROS denies CP, SOB, Abd pain, but no detail related to aphasia Objective: Vital Signs: Blood pressure 94/63, pulse 89, temperature 98.4 F (36.9 C), temperature source Oral, resp. rate 18, SpO2 97 %, unknown if currently breastfeeding. No results found. No results found for this or any previous visit (from the past 72 hour(s)).   HEENT: normal Cardio: RRR and no murmur Resp: CTA B/L and unlabored GI: BS positive and NT, ND Extremity:  BS positive and NT, ND Skin:   Central line site healed over Neuro: Alert/Oriented, Abnormal Sensory reduced sensation to LT and proprio RUE and RLE , Left side 4-5/5, Abnormal Motor 3-/5 RUE grip, 2- bi and tri , trace at delt, 2- hip/knee ext RLE and Expressive Aphasic, Ashworth 2 at biceps femoris, 3 at ankle PF, visual fields intact to confrontation Musc/Skel:  Other no pain with UE or LE ROM, mildly inverted at ankle, no ankle swelling Gen NAD   Assessment/Plan: 1.1. Functional deficits secondary to Right spastic hemiplegia,dysphagia, and aphasia secondary to large left parietal intracranial hemorrhage   which require 3+ hours per day of interdisciplinary therapy in a comprehensive inpatient rehab setting. Physiatrist is providing close team supervision and 24 hour management of active medical problems listed below. Physiatrist and rehab team continue to assess barriers to discharge/monitor patient progress toward functional and medical goals.   FIM: Function - Bathing Position: Shower Body parts bathed by patient: Right arm, Chest, Abdomen, Front perineal area, Buttocks, Right upper leg, Left upper leg, Right lower leg, Left lower leg Body parts bathed by helper: Back, Left arm Assist Level: Touching or steadying assistance(Pt > 75%) Set up : To obtain items, To open containers  Function- Upper Body Dressing/Undressing What is the patient wearing?: Bra,  Pull over shirt/dress Bra - Perfomed by patient: Thread/unthread left bra strap, Hook/unhook bra (pull down sports bra) Bra - Perfomed by helper: Thread/unthread right bra strap Pull over shirt/dress - Perfomed by patient: Thread/unthread left sleeve, Put head through opening, Pull shirt over trunk Pull over shirt/dress - Perfomed by helper: Thread/unthread right sleeve Assist Level: Touching or steadying assistance(Pt > 75%) Set up : To obtain clothing/put away Function - Lower Body Dressing/Undressing What is the patient wearing?: Pants, Underwear, Socks, Shoes Position: Standing at sink Underwear - Performed by patient: Thread/unthread left underwear leg, Thread/unthread right underwear leg, Pull underwear up/down Underwear - Performed by helper: Pull underwear up/down Pants- Performed by patient: Thread/unthread right pants leg, Thread/unthread left pants leg, Pull pants up/down Pants- Performed by helper: Pull pants up/down Non-skid slipper socks- Performed by patient: Don/doff right sock, Don/doff left sock Socks - Performed by patient: Don/doff right sock, Don/doff left sock Shoes - Performed by patient: Don/doff right shoe, Don/doff left shoe Shoes - Performed by helper: Fasten right, Fasten left Assist Level: Touching or steadying assistance (Pt > 75%) Set up : To obtain clothing/put away  Function - Toileting Toileting activity did not occur: Safety/medical concerns Toileting steps completed by patient: Performs perineal hygiene Toileting steps completed by helper: Adjust clothing prior to toileting, Adjust clothing after toileting Toileting Assistive Devices: Grab bar or rail Assist level: Touching or steadying assistance (Pt.75%)  Function - Toilet Transfers Toilet transfer assistive device: Grab bar Mechanical lift: Stedy Assist level to toilet: Touching or steadying assistance (Pt > 75%) Assist level from toilet: Touching or steadying assistance (Pt > 75%)  Function -  Chair/bed transfer Chair/bed transfer  method: Ambulatory Chair/bed transfer assist level: 2 helpers Chair/bed transfer assistive device: Walker Chair/bed transfer details: Manual facilitation for weight shifting, Manual facilitation for placement, Verbal cues for sequencing, Verbal cues for technique, Manual facilitation for weight bearing, Verbal cues for safe use of DME/AE  Function - Locomotion: Wheelchair Will patient use wheelchair at discharge?: Yes Type: Manual Max wheelchair distance: 150 Assist Level: Touching or steadying assistance (Pt > 75%) Assist Level: Touching or steadying assistance (Pt > 75%) Assist Level: Touching or steadying assistance (Pt > 75%) Function - Locomotion: Ambulation Assistive device: Walker-Eva Max distance: 55 Assist level: 2 helpers (mod-max A from PT; +2 for w/c follow) Assist level: 2 helpers Walk 50 feet with 2 turns activity did not occur: Safety/medical concerns Walk 150 feet activity did not occur: Safety/medical concerns Assist level: 2 helpers Walk 10 feet on uneven surfaces activity did not occur: Safety/medical concerns  Function - Comprehension Comprehension: Auditory Comprehension assist level: Follows basic conversation/direction with extra time/assistive device  Function - Expression Expression: Verbal Expression assist level: Expresses basic 75 - 89% of the time/requires cueing 10 - 24% of the time. Needs helper to occlude trach/needs to repeat words.  Function - Social Interaction Social Interaction assist level: Interacts appropriately 90% of the time - Needs monitoring or encouragement for participation or interaction.  Function - Problem Solving Problem solving assist level: Solves basic 50 - 74% of the time/requires cueing 25 - 49% of the time  Function - Memory Memory assist level: Recognizes or recalls 50 - 74% of the time/requires cueing 25 - 49% of the time Patient normally able to recall (first 3 days only): Location  of own room, Staff names and faces, That he or she is in a hospital, Current season  Medical Problem List and Plan: 1. Functional deficits secondary to Right flaccid hemiplegia,dysphagia, and aphasia secondary to large left parietal intracranial hemorrhage, also severe cog def, spasticity , no Zanaflex due to hypotension, trial baclofen  2. DVT Prophylaxis/Anticoagulation: Pharmaceutical: Lovenox 3. Pain Management: Tylenol prn 4. Mood: LCSW to follow for evaluation and support as cognition/aphasia improves.  5. Neuropsych: This patient is not capable of making decisions on her own behalf. 6. Skin/Wound Care: Routine pressure relief measures.  7. Fluids/Electrolytes/Nutrition: Monitor I/O. 50-80% meals yesterday, 369ml po fluids 8. Insomnia improved on trazodone 9. New onset seizures: with breakthrough now controlled on keppra 1500mg  bid., monitor valproate level , 10.UTI , Kleb P seen on urine culture, on Bactrim, follow-up culture negative,,D/C today,had Keflex prior to Bactrim 11. Hx anxiety , back on prozac LOS (Days) 4 A FACE TO FACE EVALUATION WAS PERFORMED  KIRSTEINS,ANDREW E 06/08/2015, 7:38 AM   .akp

## 2015-06-08 NOTE — Progress Notes (Signed)
Physical Therapy Session Note  Patient Details  Name: Ashley [name] MRN: [id number] Date of Birth: January 05, 1976  Today's Date: 06/08/2015 PT Individual Time: 1500-1600 PT Individual Time Calculation (min): 60 min   Short Term Goals: Week 1:  PT Short Term Goal 1 (Week 1): Pt will transfer w/c to/from bed req min A via stand-step transfer to pt's L. PT Short Term Goal 2 (Week 1): Pt will demonstrate rolling L in flat bed req min A without rails PT Short Term Goal 3 (Week 1): Pt will ambulate >= 50' with Eva walker req 1-2 person assist PT Short Term Goal 4 (Week 1): Pt will demonstrate appropriate sequencing with min cues for sit to stand req min A PT Short Term Goal 5 (Week 1): Pt will initiate stair training during PT session.   Skilled Therapeutic Interventions/Progress Updates:   Pt received in bed after napping; pt feels new medication for tone causes her to be very sleepy.  Pt also reporting HA, RN alerted but unable to receive medication for pain currently.  Pt agreeable to therapy.  Performed bed mobility, bed > w/c transfer and w/c mobility as described below.  Performed NMR in gym; see below.  Returned to room and pt left up in w/c with quick release belt in place and mother present; all items within reach.   Therapy Documentation Precautions:  Precautions Precautions: Fall Precaution Comments: R flexor synergy in LE during gait; motor apraxia; R hemiplegia; R inattention Restrictions Weight Bearing Restrictions: No Vital Signs: Therapy Vitals Temp: 98.1 F (36.7 C) Temp Source: Oral Pulse Rate: 85 Resp: 18 BP: 102/65 mmHg Patient Position (if appropriate): Sitting Oxygen Therapy SpO2: 98 % O2 Device: Not Delivered Pain: Pain Assessment Pain Assessment: 0-10 Pain Score: 4  Pain Location: Head Pain Orientation: Anterior;Mid Pain Descriptors / Indicators: Aching Pain Onset: On-going Patients Stated Pain Goal: 2 Pain Intervention(s): Medication (See eMAR) Other  Treatments: Treatments Neuromuscular Facilitation: Right;Upper Extremity;Lower Extremity;Activity to increase coordination;Activity to increase motor control;Activity to increase timing and sequencing;Activity to increase grading;Activity to increase sustained activation;Activity to increase lateral weight shifting;Activity to increase anterior-posterior weight shifting during gait x 25' forwards and 25' backwards x 2 reps with LUE support on wall rail and x 25' forwards with UE support on RW; therapist provided manual facilitation and cues for gait sequence, trunk and pelvic control/positioning, RLE placement and approximation for WB and activation during stance.     Function:  Toileting Toileting   Toileting steps completed by patient: Performs perineal hygiene;Adjust clothing after toileting;Adjust clothing prior to toileting Toileting steps completed by helper: Adjust clothing after toileting Toileting Assistive Devices: Grab bar or rail Assist level: Touching or steadying assistance (Pt.75%)   Bed Mobility Roll left and right activity   Assist level: Touching or steadying assistance (Pt > 75%, lift 1 leg) Roll left and right assistive device: Bedrails  Sit to lying activity        Lying to sitting activity   Assist level: Touching or steadying assistance (Pt > 75%, lift 1 leg) Lying to sitting assistive device: Bedrails  Mobility details Bed mobility details: Tactile cues for initiation;Tactile cues for sequencing;Visual cues/gestures for sequencing;Verbal cues for techniques;Verbal cues for sequencing;Manual facilitation for weight shifting   Transfers Sit to stand transfer   Sit to stand assist level: Touching or steadying assistance (Pt > 75%/lift 1 leg) Sit to stand assistive device: Armrests;Walker  Chair/bed transfer   Chair/bed transfer method: Squat pivot Chair/bed transfer assist level: Moderate assist (Pt 50 -  74%/lift or lower) Chair/bed transfer assistive device:  Armrests   Chair/bed transfer details: Verbal cues for sequencing;Verbal cues for technique;Verbal cues for precautions/safety;Manual facilitation for weight shifting;Tactile cues for posture;Tactile cues for initiation   Toilet transfer   Toilet transfer assistive device: Grab bar Mechanical lift: Stedy Assist level to toilet: Touching or steadying assistance (Pt > 75%) Assist level from toilet: Touching or steadying assistance (Pt > 75%)           Locomotion Ambulation   Assistive device: Rail in hallway;Walker-rolling Max distance: 50 x 2 with rail; 25' with RW Assist level: Maximal assist (Pt 25 - 49%)  Walk 10 feet activity   Assist level: Maximal assist (Pt 25 - 49%)           Stairs   Stairs assistive device: 1 hand rail Max number of stairs: 4 Stairs assist level: Maximal assist (Pt 25 - 49%)  Walk up/down 1 step activity     Walk up/down 1 step (curb) assist level: Maximal assist (Pt 25 - 49%)  Walk up/down 4 steps activity   Walk up/down 4 steps assist level: Maximal assist (Pt 25 - 49%)        Wheelchair   Type: Manual Max wheelchair distance: 150 Assist Level: Supervision or verbal cues  Wheel 50 feet with 2 turns activity   Assist Level: Supervision or verbal cues  Wheel 150 feet activity   Assist Level: Supervision or verbal cues   Cognition Comprehension Comprehension assist level: Follows basic conversation/direction with extra time/assistive device  Expression Expression assist level: Expresses basic needs/ideas: With extra time/assistive device  Social Interaction Social Interaction assist level: Interacts appropriately 90% of the time - Needs monitoring or encouragement for participation or interaction.  Problem Solving Problem solving assist level: Solves basic 50 - 74% of the time/requires cueing 25 - 49% of the time  Memory Memory assist level: Recognizes or recalls 75 - 89% of the time/requires cueing 10 - 24% of the time    Therapy/Group:  Individual Therapy  Raylene Everts [facility] 06/08/2015, 4:44 PM

## 2015-06-08 NOTE — Progress Notes (Signed)
Occupational Therapy Session Note  Patient Details  Name: Ashley [name] MRN: [id number] Date of Birth: April 18, 1976  Today's Date: 06/08/2015 OT Individual Time: 1030-1200 OT Individual Time Calculation (min): 90 min    Skilled Therapeutic Interventions/Progress Updates:    1:1 self care retraining at shower level. Pt in recliner when entered the room. Heavy focus on use of right UE to assist with doffing clothing, sitting in the recliner, sit to stand with attention to left foot placement and hip alignment in standing for clothing manipulation. Pt transitioned to toilet and shower using STEDY with focus on maintaining grasp of right hand on grab bar (of STEDY) and standing balance. In shower continued focus on functional use of right hand with min A with extra time for initiation and relaxation of tone. Pt with improvements in muscle memory and recall of sequence in shower. Transferred out of shower with stand pivot to w/c. Pt with improved recall of hemi dressing techniques with decr amt of cuing. Sit to stand with improved forward weight shift but required tactile facilitation to decr right hip internal rotation and for knee extension.   NMR: Once dressed, transitioned to sitting EOB and focus on AROM against gravity with min A with right UE taking pt through PNF. Pt able to complete more smoothly today, demonstrating decr tone in UE and increased initiation in left UE in all muscle groups. Transitioned to supine in bed and focus on left hip adduction, glut/hip/thigh/ core/trunk bilateral hip symmetry with bridges and squeezing pillow between thighs for strengthening and stability to assist with bed mobility and coordination of LE muscles for transfers.   Therapy Documentation Precautions:  Precautions Precautions: Fall Precaution Comments: R flexor synergy in LE during gait; motor apraxia; R hemiplegia; R inattention Restrictions Weight Bearing Restrictions: No Pain: No c/o during session   ADL: ADL ADL Comments: see Functional Assessment  Function:     Grooming Oral Care,Brush Teeth, Clean Dentures Activity:      Assist Level: More than reasonable amount of time;Supervision or verbal cues;Touching or steadying assistance(Pt > 75%);Set up      Wash, Rinse, Dry Face Activity   Assist Level: Set up   Set up : To obtain items  Wash, Rinse, Dry Hands Activity   Assist Level: Set up   Set up : To obtain items  Brush, Comb Hair Activity   Assist Level: Helper performed activity (helped complete the task)    Shave Activity          Apply Makeup Activity                                                             Bathing Bathing position   Position: Shower  Bathing parts Body parts bathed by patient: Right arm;Chest;Abdomen;Front perineal area;Buttocks;Right upper leg;Left upper leg;Right lower leg;Left lower leg Body parts bathed by helper: Left arm;Back  Bathing assist Assist Level: Touching or steadying assistance(Pt > 75%)   Set up : To obtain items;To open containers;To adjust water temperature   Upper Body Dressing/Undressing Upper body dressing   What is the patient wearing?: Bra;Pull over shirt/dress Bra - Perfomed by patient: Thread/unthread right bra strap;Thread/unthread left bra strap Bra - Perfomed by helper: Hook/unhook bra (pull down sports bra) Pull over shirt/dress - Perfomed by patient: Thread/unthread right  sleeve;Thread/unthread left sleeve;Put head through opening;Pull shirt over trunk          Upper body assist Assist Level: Set up;Touching or steadying assistance(Pt > 75%)   Set up : To obtain clothing/put away   Lower Body Dressing/Undressing Lower body dressing   What is the patient wearing?: Underwear;Pants;Socks;Shoes Underwear - Performed by patient: Thread/unthread right underwear leg;Thread/unthread left underwear leg;Pull underwear up/down   Pants- Performed by patient: Thread/unthread right pants leg;Thread/unthread  left pants leg;Pull pants up/down       Socks - Performed by patient: Don/doff right sock;Don/doff left sock   Shoes - Performed by patient: Don/doff left shoe Shoes - Performed by helper: Don/doff right shoe;Fasten right;Fasten left          Lower body assist Assist Level: Touching or steadying assistance (Pt > 75%)   Set up : To obtain clothing/put away   Toileting Toileting   Toileting steps completed by patient: Performs perineal hygiene;Adjust clothing prior to toileting (2/2 steps) Toileting steps completed by helper:  (was naked after shower)    Toileting assist      Bed Mobility Roll left and right activity      Sit to lying activity      Lying to sitting activity      Mobility details     Transfers Sit to stand transfer   Sit to stand assist level: Touching or steadying assistance (Pt > 75%/lift 1 leg)    Chair/bed transfer              Toilet transfer   Toilet transfer assistive device: Grab bar     Mechanical lift: Stedy      Tub/shower transfer   Tub/shower assistive device: Tub transfer bench;Grab bars;Walk in shower;Roll in shower chair   Assist level into tub:  (used the STEDY) Assist level out of tub:  (used the STEDY)   Cognition Comprehension Comprehension assist level: Follows basic conversation/direction with extra time/assistive device  Expression Expression assist level: Expresses basic 75 - 89% of the time/requires cueing 10 - 24% of the time. Needs helper to occlude trach/needs to repeat words.  Social Interaction Social Interaction assist level: Interacts appropriately 90% of the time - Needs monitoring or encouragement for participation or interaction.  Problem Solving Problem solving assist level: Solves basic 50 - 74% of the time/requires cueing 25 - 49% of the time  Memory Memory assist level: Recognizes or recalls 50 - 74% of the time/requires cueing 25 - 49% of the time    Therapy/Group: Individual Therapy  Willeen Cass [facility] 06/08/2015, 3:17 PM

## 2015-06-09 ENCOUNTER — Inpatient Hospital Stay (HOSPITAL_COMMUNITY): Payer: BLUE CROSS/BLUE SHIELD | Admitting: Physical Therapy

## 2015-06-09 ENCOUNTER — Inpatient Hospital Stay (HOSPITAL_COMMUNITY): Payer: BLUE CROSS/BLUE SHIELD | Admitting: Speech Pathology

## 2015-06-09 MED ORDER — BACLOFEN 10 MG PO TABS
5.0000 mg | ORAL_TABLET | Freq: Three times a day (TID) | ORAL | Status: DC
  Administered 2015-06-09 – 2015-06-13 (×12): 5 mg via ORAL
  Filled 2015-06-09 (×12): qty 1

## 2015-06-09 NOTE — Progress Notes (Signed)
Physical Therapy Session Note  Patient Details  Name: Ashley [name] MRN: [id number] Date of Birth: 1976-01-03  Today's Date: 06/09/2015 PT Individual Time: 1430-1530 PT Individual Time Calculation (min): 60 min   Short Term Goals: Week 1:  PT Short Term Goal 1 (Week 1): Pt will transfer w/c to/from bed req min A via stand-step transfer to pt's L. PT Short Term Goal 2 (Week 1): Pt will demonstrate rolling L in flat bed req min A without rails PT Short Term Goal 3 (Week 1): Pt will ambulate >= 50' with Eva walker req 1-2 person assist PT Short Term Goal 4 (Week 1): Pt will demonstrate appropriate sequencing with min cues for sit to stand req min A PT Short Term Goal 5 (Week 1): Pt will initiate stair training during PT session.   Skilled Therapeutic Interventions/Progress Updates:    Pt received in w/c - mother in room with infant baby - pt agreeable to PT session. Therapeutic Activity - Blocked practice of stand-step transfer w/c to/from bed - see below for details. Blocked practice of rolling L in bed x 5 reps - see below for details. Focus on transfers is educating pt to weight shift onto strong leg in order to pick up weak leg and move it, as opposed to pivoting only on strong leg. W/C Management - see below for details. Pt req repeated cues for B LE w/c propulsion only - min A from PT for propulsion/steering. Pt demonstrates good ability to flex R hip and has adequate foot clearance, but tone limits ability to extend knee in sitting and assist in pulling w/c forward with R heel. Gait Training - see below for details - pt initially req max A to progress R leg, but progresses to intermittent SBA-mod A to progress R leg when ambulating with RW. Pt demonstrates improved L step length with stabilization of R knee in stance. Neuromuscular Reeducation - PT instructs pt in quadruped activity - req 2 person assist to achieve from prone due to tone in leg and hemiplegia in arm. PT instructs pt in reaching L  arm forward - multiple compensatory techniques noted to reduce WB through R arm. PT downgrades activity to WB with B arms on low bench while in squat at EOM, then picking up cup with L arm and reaching forward to place on chair - req faciliation to WB through R arm and min A for guarding.   Pt demonstrates ability to lift R arm overhead when lying in supine. PT instructs pt to stop using L arm to move R arm and R leg for fast movements, but instead to force self to use R arm and R leg. Pt verbalizes understanding. Continue per PT POC.   Therapy Documentation Precautions:  Precautions Precautions: Fall Precaution Comments: R flexor synergy in LE during gait; motor apraxia; R hemiplegia; R inattention Restrictions Weight Bearing Restrictions: No Pain: Pain Assessment Pain Assessment: Faces Pain Score: 0-No pain Faces Pain Scale: Hurts little more Pain Type: Acute pain Pain Location: Leg Pain Orientation: Right Pain Descriptors / Indicators: Aching Pain Onset: With Activity (weightbearing in quadruped) Pain Intervention(s): Repositioned;Rest Multiple Pain Sites: No   Function:  Toileting Toileting             Bed Mobility Roll left and right activity   Assist level: Supervision or verbal cues    Sit to lying activity   Assist level: Supervision or verbal cues    Lying to sitting activity   Assist level: Supervision or  verbal cues    Mobility details Bed mobility details: Verbal cues for techniques;Verbal cues for sequencing   Transfers Sit to stand transfer   Sit to stand assist level: Touching or steadying assistance (Pt > 75%/lift 1 leg) Sit to stand assistive device: Walker;Armrests  Chair/bed transfer   Chair/bed transfer method: Squat pivot;Stand pivot Chair/bed transfer assist level: Moderate assist (Pt 50 - 74%/lift or lower) Chair/bed transfer assistive device: Armrests   Chair/bed transfer details: Manual facilitation for weight shifting;Manual facilitation  for placement;Verbal cues for technique;Verbal cues for sequencing   Toilet transfer                Car transfer          Locomotion Ambulation   Assistive device: Walker-rolling Max distance: 68 Assist level: Moderate assist (Pt 50 - 74%)  Walk 10 feet activity   Assist level: Moderate assist (Pt 50 - 74%)  Walk 50 feet with 2 turns activity      Walk 150 feet activity      Walk 10 feet on uneven surfaces activity      Stairs          Walk up/down 1 step activity        Walk up/down 4 steps activity      Walk up/down 12 steps activity      Pick up small objects from floor      Wheelchair   Type: Manual Max wheelchair distance: 150 Assist Level: Touching or steadying assistance (Pt > 75%) (B LEs only)  Wheel 50 feet with 2 turns activity   Assist Level: Touching or steadying assistance (Pt > 75%)  Wheel 150 feet activity   Assist Level: Touching or steadying assistance (Pt > 75%)   Cognition Comprehension Comprehension assist level: Understands complex 90% of the time/cues 10% of the time;Follows basic conversation/direction with no assist  Expression Expression assist level: Expresses basic needs/ideas: With no assist;Expresses complex 90% of the time/cues < 10% of the time  Social Interaction Social Interaction assist level: Interacts appropriately with others with medication or extra time (anti-anxiety, antidepressant).  Problem Solving Problem solving assist level: Solves basic 50 - 74% of the time/requires cueing 25 - 49% of the time  Memory Memory assist level: Recognizes or recalls 50 - 74% of the time/requires cueing 25 - 49% of the time    Therapy/Group: Individual Therapy  Jennilee Demarco M 06/09/2015, 3:41 PM

## 2015-06-09 NOTE — Progress Notes (Signed)
Speech Language Pathology Daily Session Note  Patient Details  Name: Courtland Coppa MRN: [id number] Date of Birth: January 03, 1976  Today's Date: 06/09/2015 SLP Individual Time: 2229-7989 SLP Individual Time Calculation (min): 45 min  Short Term Goals: Week 1: SLP Short Term Goal 1 (Week 1): Pt will consume her currently prescribed diet with mod I use of swallowing precautions.   SLP Short Term Goal 2 (Week 1): Pt will monitor and correct verbal errors during structured and unstructured tasks with min assist verbal cues.   SLP Short Term Goal 3 (Week 1): Pt will follow abstract, multi-step commands during functional tasks with min assist verbal cues in 75% of observable opportunities.  SLP Short Term Goal 4 (Week 1): Pt will return demonstration of at least 2 safety precautions during functional tasks with min assist verbal cues.  Skilled Therapeutic Interventions: Skilled treatment session focused on cognitive goals.  SLP facilitated session by providing Supervision during mock medical management trial using pill box and beads to represent medication to assess patient's appropriateness managing medication.  Pt requested assistance by asking 2-3x 'Did I do it right?'  Pt noticed errors made and self-corrected 1-2x and double checked herself.  She required assistance opening bottles and managing maneuvering pill box.  She required min A to demonstrate insight of requiring assistance with medical management d/t physical deficits.  She alternated attention with Supervision-Mod Independence between medication, pill box, and list of instructions.  She required max A attending to obstacles in the hallway and bumped into objects at least 2-3x.  She required min-mod A recalling medication names and reasons for taking medication.  She independently recalled medication taken prior to hospitalization.  Continue with current plan of care.    Function:   Cognition Comprehension Comprehension assist level:  Understands complex 90% of the time/cues 10% of the time;Follows basic conversation/direction with no assist  Expression   Expression assist level: Expresses basic needs/ideas: With no assist;Expresses complex 90% of the time/cues < 10% of the time  Social Interaction Social Interaction assist level: Interacts appropriately with others with medication or extra time (anti-anxiety, antidepressant).  Problem Solving Problem solving assist level: Solves basic 50 - 74% of the time/requires cueing 25 - 49% of the time  Memory Memory assist level: Recognizes or recalls 50 - 74% of the time/requires cueing 25 - 49% of the time    Pain Pain Assessment Pain Assessment: 0-10 Pain Score: 0-No pain  Therapy/Group: Individual Therapy   Annabell Howells, M.A. CCC-SLP Annabell Howells A 06/09/2015, 12:46 PM

## 2015-06-09 NOTE — Progress Notes (Signed)
Subjective/Complaints: No issues overnight. Slept fairly well. Denies spasms.    ROS denies CP, SOB, Abd pain, but no detail related to aphasia Objective: Vital Signs: Blood pressure 98/62, pulse 76, temperature 97.6 F (36.4 C), temperature source Oral, resp. rate 18, SpO2 96 %, unknown if currently breastfeeding. No results found. No results found for this or any previous visit (from the past 72 hour(s)).   HEENT: normal Cardio: RRR and no murmur Resp: CTA B/L and unlabored GI: BS positive and NT, ND Extremity:  BS positive and NT, ND Skin:   Central line site healed over Neuro: Alert/Oriented, Abnormal Sensory reduced sensation to LT and proprio RUE and RLE , Left side 4-5/5, Abnormal Motor 3-/5 RUE grip, 2- bi and tri , trace at delt, 2- hip/knee ext RLE and Expressive Aphasic, Ashworth 1-2 at biceps femoris, 3 at ankle PF, visual fields intact to confrontation. Has difficulty initiating movements. Some apraxia noted Musc/Skel:  Other no pain with UE or LE ROM, mildly inverted at ankle, no ankle swelling Gen NAD   Assessment/Plan: 1.1. Functional deficits secondary to Right spastic hemiplegia,dysphagia, and aphasia secondary to large left parietal intracranial hemorrhage   which require 3+ hours per day of interdisciplinary therapy in a comprehensive inpatient rehab setting. Physiatrist is providing close team supervision and 24 hour management of active medical problems listed below. Physiatrist and rehab team continue to assess barriers to discharge/monitor patient progress toward functional and medical goals.   FIM: Function - Bathing Position: Shower Body parts bathed by patient: Right arm, Chest, Abdomen, Front perineal area, Buttocks, Right upper leg, Left upper leg, Right lower leg, Left lower leg Body parts bathed by helper: Left arm, Back Assist Level: Touching or steadying assistance(Pt > 75%) Set up : To obtain items, To open containers, To adjust water  temperature  Function- Upper Body Dressing/Undressing What is the patient wearing?: Bra, Pull over shirt/dress Bra - Perfomed by patient: Thread/unthread right bra strap, Thread/unthread left bra strap Bra - Perfomed by helper: Hook/unhook bra (pull down sports bra) Pull over shirt/dress - Perfomed by patient: Thread/unthread right sleeve, Thread/unthread left sleeve, Put head through opening, Pull shirt over trunk Pull over shirt/dress - Perfomed by helper: Thread/unthread right sleeve Assist Level: Set up, Touching or steadying assistance(Pt > 75%) Set up : To obtain clothing/put away Function - Lower Body Dressing/Undressing What is the patient wearing?: Underwear, Pants, Socks, Shoes Position: Wheelchair/chair at sink Underwear - Performed by patient: Thread/unthread right underwear leg, Thread/unthread left underwear leg, Pull underwear up/down Underwear - Performed by helper: Pull underwear up/down Pants- Performed by patient: Thread/unthread right pants leg, Thread/unthread left pants leg, Pull pants up/down Pants- Performed by helper: Pull pants up/down Non-skid slipper socks- Performed by patient: Don/doff right sock, Don/doff left sock Socks - Performed by patient: Don/doff right sock, Don/doff left sock Shoes - Performed by patient: Don/doff left shoe Shoes - Performed by helper: Don/doff right shoe, Fasten right, Fasten left Assist Level: Touching or steadying assistance (Pt > 75%) Set up : To obtain clothing/put away  Function - Toileting Toileting activity did not occur: Safety/medical concerns Toileting steps completed by patient: Performs perineal hygiene, Adjust clothing after toileting, Adjust clothing prior to toileting Toileting steps completed by helper: Adjust clothing after toileting Toileting Assistive Devices: Grab bar or rail Assist level: Touching or steadying assistance (Pt.75%)  Function - Toilet Transfers Toilet transfer assistive device: Grab  bar Mechanical lift: Stedy Assist level to toilet: Touching or steadying assistance (Pt > 75%) Assist  level from toilet: Touching or steadying assistance (Pt > 75%)  Function - Chair/bed transfer Chair/bed transfer method: Squat pivot Chair/bed transfer assist level: Moderate assist (Pt 50 - 74%/lift or lower) Chair/bed transfer assistive device: Armrests Chair/bed transfer details: Verbal cues for sequencing, Verbal cues for technique, Verbal cues for precautions/safety, Manual facilitation for weight shifting, Tactile cues for posture, Tactile cues for initiation  Function - Locomotion: Wheelchair Will patient use wheelchair at discharge?: Yes Type: Manual Max wheelchair distance: 150 Assist Level: Supervision or verbal cues Assist Level: Supervision or verbal cues Assist Level: Supervision or verbal cues Function - Locomotion: Ambulation Assistive device: Rail in hallway, Walker-rolling Max distance: 50 x 2 with rail; 25' with RW Assist level: Maximal assist (Pt 25 - 49%) Assist level: Maximal assist (Pt 25 - 49%) Walk 50 feet with 2 turns activity did not occur: Safety/medical concerns Walk 150 feet activity did not occur: Safety/medical concerns Assist level: 2 helpers Walk 10 feet on uneven surfaces activity did not occur: Safety/medical concerns  Function - Comprehension Comprehension: Auditory Comprehension assist level: Follows basic conversation/direction with extra time/assistive device  Function - Expression Expression: Verbal Expression assist level: Expresses basic needs/ideas: With extra time/assistive device  Function - Social Interaction Social Interaction assist level: Interacts appropriately 90% of the time - Needs monitoring or encouragement for participation or interaction.  Function - Problem Solving Problem solving assist level: Solves basic 50 - 74% of the time/requires cueing 25 - 49% of the time  Function - Memory Memory assist level: Recognizes or  recalls 75 - 89% of the time/requires cueing 10 - 24% of the time Patient normally able to recall (first 3 days only): Location of own room, Staff names and faces, That he or she is in a hospital, Current season  Medical Problem List and Plan: 1. Functional deficits secondary to Right flaccid hemiplegia,dysphagia, and aphasia secondary to large left parietal intracranial hemorrhage, also severe cog def, spasticity , no Zanaflex due to hypotension, trial baclofen which appears to be somewhat effective.  -might benefit from [facility] R[facility]---asked pt/husband to work on heel cord stretching. Will hold off for now. 2. DVT Prophylaxis/Anticoagulation: Pharmaceutical: Lovenox 3. Pain Management: Tylenol prn 4. Mood: LCSW to follow for evaluation and support as cognition/aphasia improves.  5. Neuropsych: This patient is not capable of making decisions on her own behalf. 6. Skin/Wound Care: Routine pressure relief measures.  7. Fluids/Electrolytes/Nutrition: Monitor I/O. 100% meals yesterday, 775ml po fluids 8. Insomnia: improved on trazodone 9. New onset seizures: with breakthrough now controlled on keppra 1500mg  bid., monitor valproate level , 10.UTI , Kleb P seen on urine culture, off abx 11. Hx anxiety , back on prozac LOS (Days) 5 A FACE TO FACE EVALUATION WAS PERFORMED  Jacyln Carmer T 06/09/2015, 8:47 AM   .akp

## 2015-06-10 ENCOUNTER — Inpatient Hospital Stay (HOSPITAL_COMMUNITY): Payer: BLUE CROSS/BLUE SHIELD | Admitting: Occupational Therapy

## 2015-06-10 NOTE — Progress Notes (Signed)
Occupational Therapy Session Note  Patient Details  Name: Ashley [name] MRN: [id number] Date of Birth: 12/08/1975  Today's Date: 06/10/2015 OT Individual Time: 7829-5621 OT Individual Time Calculation (min): 60 min    Short Term Goals: Week 1:  OT Short Term Goal 1 (Week 1): Pt will sequence bathing 10 out of 10 parts with min questioning cues OT Short Term Goal 2 (Week 1): Pt will complete toilet transfer with min assist to place right L/UE OT Short Term Goal 3 (Week 1): Pt will complete sit>stand in prep for lower body B & D with max  OT Short Term Goal 4 (Week 1): Pt will use right UE as gross assist during functional task with min instructional cues.  Skilled Therapeutic Interventions/Progress Updates:    Addressed transfers, NMRE, sit to stand, [place] stratetgies.  Pt transferred from bed to wc with mod assst and to mat table in gym.  Engaged in [place], soft tissue mobs, and AAROM.  Pt exhibits increased tone in R elbow, shoulder, forearm.  Did placing of RUE in WB with pt going from sit to stand. Repeated x5,  3 sets.  Used stool for WB.  Did ask pt to practice abuction, external rot like serving, placing hand on bed with elbow extension, and positioning on the rolling table forr activities in room .  Husband observed session and pt and husband verbalized understanding of exercises.   Therapy Documentation Precautions:  Precautions Precautions: Fall Precaution Comments: R flexor synergy in LE during gait; motor apraxia; R hemiplegia; R inattention Restrictions Weight Bearing Restrictions: No    Vital Signs:  Pain: Pain Assessment Pain Score: 2   Pt complained of pain in left thumb extensors.   ADL: ADL ADL Comments: see Functional Assessment        Function:   Eating Eating   Eating Assist Level: Set up assist for   Eating Set Up Assist For: Opening containers Helper Scoops Food on Utensil: Occasionally Helper Brings Food to Mouth: Occasionally   Grooming Oral  Care,Brush Teeth, Clean Dentures Activity:             Wash, Rinse, Dry Face Activity          Wash, Rinse, Dry Hands Activity          Brush, Comb Hair Activity        Shave Activity          Apply Makeup Activity                                                             Bathing Bathing position      Bathing parts      Bathing assist         Upper Body Dressing/Undressing Upper body dressing                    Upper body assist         Lower Body Dressing/Undressing Lower body dressing                                  Lower body assist         Toileting Toileting   Toileting steps completed by patient: Performs perineal  hygiene Toileting steps completed by helper: Adjust clothing prior to toileting;Adjust clothing after toileting;Performs perineal hygiene Toileting Assistive Devices: Grab bar or rail  Toileting assist Assist level: Touching or steadying assistance (Pt.75%)    Bed Mobility Roll left and right activity   Assist level: Supervision or verbal cues  Sit to lying activity   Assist level: Supervision or verbal cues  Lying to sitting activity   Assist level: Supervision or verbal cues  Mobility details Bed mobility details: Verbal cues for techniques;Verbal cues for sequencing   Transfers Sit to stand transfer   Sit to stand assist level: Touching or steadying assistance (Pt > 75%/lift 1 leg) Sit to stand assistive device: Walker;Bedrails  Chair/bed transfer   Chair/bed transfer method: Squat pivot Chair/bed transfer assist level: Moderate assist (Pt 50 - 74%/lift or lower) Chair/bed transfer assistive device: Armrests   Chair/bed transfer details: Verbal cues for technique;Verbal cues for sequencing;Manual facilitation for placement  Toilet transfer   Toilet transfer assistive device: Grab bar   Assist level from bedside commode (at bedside): Moderate assist (Pt 50 - 74%/lift or lower)   Assist level to  toilet: Touching or steadying assistance (Pt > 75%) Assist level from toilet: Touching or steadying assistance (Pt > 75%)  Tub/shower transfer             Cognition Comprehension Comprehension assist level: Understands complex 90% of the time/cues 10% of the time  Expression Expression assist level: Expresses basic needs/ideas: With extra time/assistive device  Social Interaction Social Interaction assist level: Interacts appropriately with others with medication or extra time (anti-anxiety, antidepressant).  Problem Solving Problem solving assist level: Solves basic 50 - 74% of the time/requires cueing 25 - 49% of the time  Memory Memory assist level: Recognizes or recalls 50 - 74% of the time/requires cueing 25 - 49% of the time    Therapy/Group: Individual Therapy  Lisa Roca 06/10/2015, 6:37 PM

## 2015-06-10 NOTE — Progress Notes (Signed)
Subjective/Complaints: Had a good day/night. Doesn't feel drowsy from baclofen. .    ROS denies CP, SOB, Abd pain, but no detail related to aphasia Objective: Vital Signs: Blood pressure 93/73, pulse 73, temperature 97.6 F (36.4 C), temperature source Oral, resp. rate 18, SpO2 98 %, unknown if currently breastfeeding. No results found. No results found for this or any previous visit (from the past 72 hour(s)).   HEENT: normal Cardio: RRR and no murmur Resp: CTA B/L and unlabored GI: BS positive and NT, ND Extremity:  BS positive and NT, ND Skin:   Central line site healed over Neuro: Alert/Oriented, Abnormal Sensory reduced sensation to LT and proprio RUE and RLE , Left side 4-5/5, Abnormal Motor 3-/5 RUE grip, 2- bi and tri , trace at delt, 2- hip/knee ext RLE and Expressive Aphasic, Ashworth 1 + at biceps femoris, 3 at ankle PF with tight heel cord, visual fields intact to confrontation. Has difficulty initiating movements. Some apraxia noted Musc/Skel:  Other no pain with UE or LE ROM, mildly inverted at ankle, no ankle swelling Gen NAD   Assessment/Plan: 1.1. Functional deficits secondary to Right spastic hemiplegia,dysphagia, and aphasia secondary to large left parietal intracranial hemorrhage   which require 3+ hours per day of interdisciplinary therapy in a comprehensive inpatient rehab setting. Physiatrist is providing close team supervision and 24 hour management of active medical problems listed below. Physiatrist and rehab team continue to assess barriers to discharge/monitor patient progress toward functional and medical goals.   FIM: Function - Bathing Position: Shower Body parts bathed by patient: Right arm, Chest, Abdomen, Front perineal area, Buttocks, Right upper leg, Left upper leg, Right lower leg, Left lower leg Body parts bathed by helper: Left arm, Back Assist Level: Touching or steadying assistance(Pt > 75%) Set up : To obtain items, To open  containers, To adjust water temperature  Function- Upper Body Dressing/Undressing What is the patient wearing?: Bra, Pull over shirt/dress Bra - Perfomed by patient: Thread/unthread right bra strap, Thread/unthread left bra strap Bra - Perfomed by helper: Hook/unhook bra (pull down sports bra) Pull over shirt/dress - Perfomed by patient: Thread/unthread right sleeve, Thread/unthread left sleeve, Put head through opening, Pull shirt over trunk Pull over shirt/dress - Perfomed by helper: Thread/unthread right sleeve Assist Level: Set up, Touching or steadying assistance(Pt > 75%) Set up : To obtain clothing/put away Function - Lower Body Dressing/Undressing What is the patient wearing?: Underwear, Pants, Socks, Shoes Position: Wheelchair/chair at sink Underwear - Performed by patient: Thread/unthread right underwear leg, Thread/unthread left underwear leg, Pull underwear up/down Underwear - Performed by helper: Pull underwear up/down Pants- Performed by patient: Thread/unthread right pants leg, Thread/unthread left pants leg, Pull pants up/down Pants- Performed by helper: Pull pants up/down Non-skid slipper socks- Performed by patient: Don/doff right sock, Don/doff left sock Socks - Performed by patient: Don/doff right sock, Don/doff left sock Shoes - Performed by patient: Don/doff left shoe Shoes - Performed by helper: Don/doff right shoe, Fasten right, Fasten left Assist Level: Touching or steadying assistance (Pt > 75%) Set up : To obtain clothing/put away  Function - Toileting Toileting activity did not occur: Safety/medical concerns Toileting steps completed by patient: Performs perineal hygiene, Adjust clothing prior to toileting, Adjust clothing after toileting Toileting steps completed by helper: Adjust clothing prior to toileting, Performs perineal hygiene, Adjust clothing after toileting Toileting Assistive Devices: Grab bar or rail Assist level: Supervision or verbal  cues  Function - Toilet Transfers Toilet transfer activity did not occur: Refused  Toilet transfer assistive device: Grab bar Mechanical lift: Stedy Assist level to toilet: Touching or steadying assistance (Pt > 75%) Assist level from toilet: Touching or steadying assistance (Pt > 75%) Assist level to bedside commode (at bedside): 2 helpers Assist level from bedside commode (at bedside): 2 helpers  Function - Chair/bed transfer Chair/bed transfer method: Squat pivot, Stand pivot Chair/bed transfer assist level: Moderate assist (Pt 50 - 74%/lift or lower) Chair/bed transfer assistive device: Armrests Chair/bed transfer details: Manual facilitation for weight shifting, Manual facilitation for placement, Verbal cues for technique, Verbal cues for sequencing  Function - Locomotion: Wheelchair Will patient use wheelchair at discharge?: Yes Type: Manual Max wheelchair distance: 150 Assist Level: Touching or steadying assistance (Pt > 75%) (B LEs only) Assist Level: Touching or steadying assistance (Pt > 75%) Assist Level: Touching or steadying assistance (Pt > 75%) Function - Locomotion: Ambulation Assistive device: Walker-rolling Max distance: 68 Assist level: Moderate assist (Pt 50 - 74%) Assist level: Moderate assist (Pt 50 - 74%) Walk 50 feet with 2 turns activity did not occur: Safety/medical concerns Walk 150 feet activity did not occur: Safety/medical concerns Assist level: 2 helpers Walk 10 feet on uneven surfaces activity did not occur: Safety/medical concerns  Function - Comprehension Comprehension: Auditory Comprehension assist level: Understands complex 90% of the time/cues 10% of the time  Function - Expression Expression: Verbal Expression assist level: Expresses basic needs/ideas: With no assist, Expresses complex 90% of the time/cues < 10% of the time  Function - Social Interaction Social Interaction assist level: Interacts appropriately with others with medication  or extra time (anti-anxiety, antidepressant).  Function - Problem Solving Problem solving assist level: Solves basic 50 - 74% of the time/requires cueing 25 - 49% of the time  Function - Memory Memory assist level: Recognizes or recalls 50 - 74% of the time/requires cueing 25 - 49% of the time Patient normally able to recall (first 3 days only): That he or she is in a hospital, Location of own room, Current season  Medical Problem List and Plan: 1. Functional deficits secondary to Right flaccid hemiplegia,dysphagia, and aphasia secondary to large left parietal intracranial hemorrhage, also severe cog def, spasticity   -increased baclofen to 5mg  tid with some improvement  -would benefit from [facility] RLE---will order today. 2. DVT Prophylaxis/Anticoagulation: Pharmaceutical: Lovenox 3. Pain Management: Tylenol prn 4. Mood: LCSW to follow for evaluation and support as cognition/aphasia improves.  5. Neuropsych: This patient is not capable of making decisions on her own behalf. 6. Skin/Wound Care: Routine pressure relief measures.  7. Fluids/Electrolytes/Nutrition: Monitor I/O. Eating much better. 8. Insomnia: improved on trazodone 9. New onset seizures: with breakthrough now controlled on keppra 1500mg  bid., monitor valproate level , 10.UTI , Kleb P seen on urine culture, off abx 11. Hx anxiety , back on prozac--appears under control LOS (Days) 6 A FACE TO FACE EVALUATION WAS PERFORMED  Keigen Caddell T 06/10/2015, 8:56 AM   .akp

## 2015-06-11 ENCOUNTER — Inpatient Hospital Stay (HOSPITAL_COMMUNITY): Payer: BLUE CROSS/BLUE SHIELD

## 2015-06-11 ENCOUNTER — Inpatient Hospital Stay (HOSPITAL_COMMUNITY): Payer: BLUE CROSS/BLUE SHIELD | Admitting: Physical Therapy

## 2015-06-11 ENCOUNTER — Inpatient Hospital Stay (HOSPITAL_COMMUNITY): Payer: BLUE CROSS/BLUE SHIELD | Admitting: Speech Pathology

## 2015-06-11 DIAGNOSIS — M62479 Contracture of muscle, unspecified ankle and foot: Secondary | ICD-10-CM | POA: Diagnosis not present

## 2015-06-11 HISTORY — DX: Contracture of muscle, unspecified ankle and foot: M62.479

## 2015-06-11 NOTE — Progress Notes (Signed)
Recreational Therapy Session Note  Patient Details  Name: Ashley [name] MRN: [id number] Date of Birth: [birth year] Today's Date: 06/11/2015   Pt participated in animal assisted activity/therapy bed level with supervision.  [place] 06/11/2015, 3:05 PM

## 2015-06-11 NOTE — Progress Notes (Signed)
Physical Therapy Session Note  Patient Details  Name: Ashley [name] MRN: [id number] Date of Birth: 07-02-1976  Today's Date: 06/11/2015 PT Individual Time: 1106-1200 PT Individual Time Calculation (min): 54 min   Short Term Goals: Week 1:  PT Short Term Goal 1 (Week 1): Pt will transfer w/c to/from bed req min A via stand-step transfer to pt's L. PT Short Term Goal 2 (Week 1): Pt will demonstrate rolling L in flat bed req min A without rails PT Short Term Goal 3 (Week 1): Pt will ambulate >= 50' with Eva walker req 1-2 person assist PT Short Term Goal 3 - Progress (Week 1): Met PT Short Term Goal 4 (Week 1): Pt will demonstrate appropriate sequencing with min cues for sit to stand req min A PT Short Term Goal 5 (Week 1): Pt will initiate stair training during PT session.  PT Short Term Goal 5 - Progress (Week 1): Met  Skilled Therapeutic Interventions/Progress Updates:   Pt received in w/c in room.  Pt stating that MD has ordered a brace for her R foot but no one has delivered it yet; she also states that the nursing staff did not don her PRAFO boot until 7am today.  Discussed with pt process for trialing and ordering AFO. Also educated pt on and discussed advocating for herself at night and requesting the [facility] to be donned once in bed if husband not present; will educate husband on proper donning of PRAFO at night.  Pt performed w/c mobility to gym as below.  Performed gait with trial of posterior leaf spring AFO R foot as below; also performed NMR with gait and stair negotiation as below with AFO.  At end of session Pt left in wheelchair with quick release belt in place and all items within reach.    Therapy Documentation Precautions:  Precautions Precautions: Fall Precaution Comments: R flexor synergy in LE during gait; motor apraxia; R hemiplegia; R inattention Restrictions Weight Bearing Restrictions: No Pain: Pain Assessment Pain Assessment: No/denies pain Other Treatments:  Treatments Neuromuscular Facilitation: Right;Upper Extremity;Lower Extremity;Forced use;Activity to increase coordination;Activity to increase motor control;Activity to increase timing and sequencing;Activity to increase grading;Activity to increase sustained activation;Activity to increase lateral weight shifting;Activity to increase anterior-posterior weight shifting during gait and stair negotiation with focus on trunk/postural control, muscle grading, weight shifting and initiation of RLE advancement and extension in stance phase; required mod A overall.   Function:   Transfers Sit to stand transfer   Sit to stand assist level: Touching or steadying assistance (Pt > 75%/lift 1 leg) Sit to stand assistive device: Armrests  Chair/bed transfer   Chair/bed transfer method: Stand pivot Chair/bed transfer assist level: Moderate assist (Pt 50 - 74%/lift or lower) Chair/bed transfer assistive device: Walker   Chair/bed transfer details: Tactile cues for initiation;Tactile cues for sequencing;Tactile cues for weight shifting;Tactile cues for posture;Tactile cues for placement;Verbal cues for sequencing;Verbal cues for safe use of DME/AE;Manual facilitation for weight shifting;Manual facilitation for placement;Manual facilitation for weight bearing   Toilet transfer                Car transfer          Locomotion Ambulation   Assistive device: Walker-rolling Max distance: 75 Assist level: Moderate assist (Pt 50 - 74%)  Walk 10 feet activity   Assist level: Moderate assist (Pt 50 - 74%)  Walk 50 feet with 2 turns activity   Assist level: Moderate assist (Pt 50 - 74%)  Walk 150 feet activity  Walk 10 feet on uneven surfaces activity      Stairs   Stairs assistive device: 2 hand rails;1 hand rail   Stairs assist level: Maximal assist (Pt 25 - 49%)  Walk up/down 1 step activity     Walk up/down 1 step (curb) assist level: Maximal assist (Pt 25 - 49%)  Walk up/down 4 steps  activity   Walk up/down 4 steps assist level: Maximal assist (Pt 25 - 49%)  Walk up/down 12 steps activity      Pick up small objects from floor      Wheelchair   Type: Manual Max wheelchair distance: 150 Assist Level: Supervision or verbal cues  Wheel 50 feet with 2 turns activity   Assist Level: Supervision or verbal cues  Wheel 150 feet activity   Assist Level: Supervision or verbal cues    Therapy/Group: Individual Therapy  Raylene Everts [facility] 06/11/2015, 12:32 PM

## 2015-06-11 NOTE — Plan of Care (Signed)
Problem: RH PAIN MANAGEMENT Goal: RH STG PAIN MANAGED AT OR BELOW PT'S PAIN GOAL Less than 3,on scale 1 to 10  Outcome: Progressing No c/o pain

## 2015-06-11 NOTE — Progress Notes (Signed)
Subjective/Complaints: Has PRAFO but is uncomfortable , sleeps with RLE externally rotated    ROS denies CP, SOB, Abd pain, no ankle or foot pain   but no detail related to aphasia Objective: Vital Signs: Blood pressure 108/60, pulse 75, temperature 97.8 F (36.6 C), temperature source Oral, resp. rate 18, SpO2 100 %, unknown if currently breastfeeding. No results found. No results found for this or any previous visit (from the past 72 hour(s)).   HEENT: normal Cardio: RRR and no murmur Resp: CTA B/L and unlabored GI: BS positive and NT, ND Extremity:  BS positive and NT, ND Skin:   Central line site healed over Neuro: Alert/Oriented, Abnormal Sensory reduced sensation to LT and proprio RUE and RLE , Left side 4-5/5, Abnormal Motor 3-/5 RUE grip, 3- bi and tri , 2- at delt, 2- hip/knee ext RLE and Expressive Aphasic, [place] 1 + at biceps femoris, 3 at ankle PF with tight heel cord, visual fields intact to confrontation. Has difficulty initiating movements. Some apraxia noted Musc/Skel:  Other no pain with UE or LE ROM, mildly inverted at ankle, no ankle swelling Gen NAD   Assessment/Plan: 1. Functional deficits secondary to Right spastic hemiplegia,dysphagia, and aphasia secondary to large left parietal intracranial hemorrhage   which require 3+ hours per day of interdisciplinary therapy in a comprehensive inpatient rehab setting. Physiatrist is providing close team supervision and 24 hour management of active medical problems listed below. Physiatrist and rehab team continue to assess barriers to discharge/monitor patient progress toward functional and medical goals.   FIM: Function - Bathing Position: Shower Body parts bathed by patient: Right arm, Chest, Abdomen, Front perineal area, Buttocks, Right upper leg, Left upper leg, Right lower leg, Left lower leg Body parts bathed by helper: Left arm, Back Assist Level: Touching or steadying assistance(Pt > 75%) Set up : To  obtain items, To open containers, To adjust water temperature  Function- Upper Body Dressing/Undressing What is the patient wearing?: Bra, Pull over shirt/dress Bra - Perfomed by patient: Thread/unthread right bra strap, Thread/unthread left bra strap Bra - Perfomed by helper: Hook/unhook bra (pull down sports bra) Pull over shirt/dress - Perfomed by patient: Thread/unthread right sleeve, Thread/unthread left sleeve, Put head through opening, Pull shirt over trunk Pull over shirt/dress - Perfomed by helper: Thread/unthread right sleeve Assist Level: Set up, Touching or steadying assistance(Pt > 75%) Set up : To obtain clothing/put away Function - Lower Body Dressing/Undressing What is the patient wearing?: Underwear, Pants, Socks, Shoes Position: Wheelchair/chair at sink Underwear - Performed by patient: Thread/unthread right underwear leg, Thread/unthread left underwear leg, Pull underwear up/down Underwear - Performed by helper: Pull underwear up/down Pants- Performed by patient: Thread/unthread right pants leg, Thread/unthread left pants leg, Pull pants up/down Pants- Performed by helper: Pull pants up/down Non-skid slipper socks- Performed by patient: Don/doff right sock, Don/doff left sock Socks - Performed by patient: Don/doff right sock, Don/doff left sock Shoes - Performed by patient: Don/doff left shoe Shoes - Performed by helper: Don/doff right shoe, Fasten right, Fasten left Assist Level: Touching or steadying assistance (Pt > 75%) Set up : To obtain clothing/put away  Function - Toileting Toileting activity did not occur: Safety/medical concerns Toileting steps completed by patient: Performs perineal hygiene Toileting steps completed by helper: Adjust clothing prior to toileting, Performs perineal hygiene, Adjust clothing after toileting Toileting Assistive Devices: Grab bar or rail Assist level: Touching or steadying assistance (Pt.75%)  Function - Toilet Transfers Toilet  transfer activity did not occur: Refused  Toilet transfer assistive device: Grab bar Mechanical lift: Stedy Assist level to toilet: Touching or steadying assistance (Pt > 75%) Assist level from toilet: Touching or steadying assistance (Pt > 75%) Assist level to bedside commode (at bedside): 2 helpers Assist level from bedside commode (at bedside): Moderate assist (Pt 50 - 74%/lift or lower)  Function - Chair/bed transfer Chair/bed transfer method: Squat pivot Chair/bed transfer assist level: Moderate assist (Pt 50 - 74%/lift or lower) Chair/bed transfer assistive device: Armrests Chair/bed transfer details: Verbal cues for technique, Verbal cues for sequencing, Manual facilitation for placement  Function - Locomotion: Wheelchair Will patient use wheelchair at discharge?: Yes Type: Manual Max wheelchair distance: 150 Assist Level: Touching or steadying assistance (Pt > 75%) (B LEs only) Assist Level: Touching or steadying assistance (Pt > 75%) Assist Level: Touching or steadying assistance (Pt > 75%) Function - Locomotion: Ambulation Assistive device: Walker-rolling Max distance: 68 Assist level: Moderate assist (Pt 50 - 74%) Assist level: Moderate assist (Pt 50 - 74%) Walk 50 feet with 2 turns activity did not occur: Safety/medical concerns Walk 150 feet activity did not occur: Safety/medical concerns Assist level: 2 helpers Walk 10 feet on uneven surfaces activity did not occur: Safety/medical concerns  Function - Comprehension Comprehension: Auditory Comprehension assist level: Understands complex 90% of the time/cues 10% of the time  Function - Expression Expression: Verbal Expression assist level: Expresses basic needs/ideas: With extra time/assistive device  Function - Social Interaction Social Interaction assist level: Interacts appropriately with others with medication or extra time (anti-anxiety, antidepressant).  Function - Problem Solving Problem solving assist  level: Solves basic 50 - 74% of the time/requires cueing 25 - 49% of the time  Function - Memory Memory assist level: Recognizes or recalls 50 - 74% of the time/requires cueing 25 - 49% of the time Patient normally able to recall (first 3 days only): That he or she is in a hospital, Location of own room, Current season  Medical Problem List and Plan: 1. Functional deficits secondary to Right flaccid hemiplegia,dysphagia, and aphasia secondary to large left parietal intracranial hemorrhage, also severe cog def, spasticity   -increased baclofen to 5mg  tid with some improvement  -would benefit from AFO RLE---will order today. 2. DVT Prophylaxis/Anticoagulation: Pharmaceutical: Lovenox 3. Pain Management: Tylenol prn 4. Mood: LCSW to follow for evaluation and support as cognition/aphasia improves.  5. Neuropsych: This patient is not capable of making decisions on her own behalf. 6. Skin/Wound Care: Routine pressure relief measures.  7. Fluids/Electrolytes/Nutrition: Monitor I/O. Eating much better. 8. Insomnia: improved on trazodone 9. New onset seizures: with breakthrough now controlled on keppra 1500mg  bid., monitor valproate level ,no further seizures since readmit 10. Hx anxiety , back on prozac 10mg --appears under control 11.  Ankle contracture- heel cord, Cont night time PRAFO LOS (Days) 7 A FACE TO FACE EVALUATION WAS PERFORMED  Feliz Lincoln E 06/11/2015, 8:02 AM   .akp

## 2015-06-11 NOTE — Progress Notes (Signed)
Orthopedic Tech Progress Note Patient Details:  Ashley [name] 1976-08-20 [id number]  Ortho Devices Type of Ortho Device: Postop shoe/boot, Other (comment) Ortho Device/Splint Location: RLE Prafo Ortho Device/Splint Interventions: Application   Asia R Thompson 06/11/2015, 6:22 AM

## 2015-06-11 NOTE — Progress Notes (Signed)
Speech Language Pathology Daily Session Note  Patient Details  Name: Ashley [name] MRN: [id number] Date of Birth: Mar 04, 1976  Today's Date: 06/11/2015 SLP Individual Time: 1000-1100 SLP Individual Time Calculation (min): 60 min  Short Term Goals: Week 1: SLP Short Term Goal 1 (Week 1): Pt will consume her currently prescribed diet with mod I use of swallowing precautions.   SLP Short Term Goal 2 (Week 1): Pt will monitor and correct verbal errors during structured and unstructured tasks with min assist verbal cues.   SLP Short Term Goal 3 (Week 1): Pt will follow abstract, multi-step commands during functional tasks with min assist verbal cues in 75% of observable opportunities.  SLP Short Term Goal 4 (Week 1): Pt will return demonstration of at least 2 safety precautions during functional tasks with min assist verbal cues.  Skilled Therapeutic Interventions:   Pt was seen for skilled ST targeting cognitive goals.  Upon arrival, pt was seated upright in wheelchair, awake, lethargic, but agreeable to participate in [place].  Per pt report, pt premedicated with Baclofen prior to SLP's arrival and was lethargic as a result.  Pt able to participate in a new learning activity targeting use of compensatory strategies for memory despite lethargy and was able to create category-picture associations with min assist verbal cues.  Pt required mod faded to min assist verbal cues for delayed recall of categories via use of associations.  Pt was also able to name specific category members with min assist verbal cues for recall of previously named items.  SLP also facilitated the session with a semi-complex verbal reasoning task targeting use of word finding strategies and abstract reasoning.  Pt utilized compensatory strategies during the abovementioned task with supervision cues.  Upon return to room pt initiated request to use the bathroom and benefited from min assist verbal cues to apply brakes to wheelchair prior  to transferring to toilet.  Pt was left in wheelchair with call bell within reach and quick release bell donned.  Continue per current plan of care.    Function:  Eating Eating                 Cognition Comprehension Comprehension assist level: Understands complex 90% of the time/cues 10% of the time  Expression   Expression assist level: Expresses basic needs/ideas: With extra time/assistive device  Social Interaction Social Interaction assist level: Interacts appropriately 90% of the time - Needs monitoring or encouragement for participation or interaction.  Problem Solving Problem solving assist level: Solves basic 50 - 74% of the time/requires cueing 25 - 49% of the time  Memory Memory assist level: Recognizes or recalls 50 - 74% of the time/requires cueing 25 - 49% of the time    Pain Pain Assessment Pain Assessment: No/denies pain  Therapy/Group: Individual Therapy  Ericia Moxley, Selinda Orion 06/11/2015, 3:18 PM

## 2015-06-11 NOTE — Progress Notes (Signed)
Occupational Therapy Session Note  Patient Details  Name: Ashley [name] MRN: [id number] Date of Birth: 1976-07-21  Today's Date: 06/11/2015 OT Individual Time: 0730-0900 OT Individual Time Calculation (min): 90 min    Short Term Goals: Week 1:  OT Short Term Goal 1 (Week 1): Pt will sequence bathing 10 out of 10 parts with min questioning cues OT Short Term Goal 2 (Week 1): Pt will complete toilet transfer with min assist to place right L/UE OT Short Term Goal 3 (Week 1): Pt will complete sit>stand in prep for lower body B & D with max  OT Short Term Goal 4 (Week 1): Pt will use right UE as gross assist during functional task with min instructional cues.  Skilled Therapeutic Interventions/Progress Updates: ADL-retraining with focus on NMR of RUE, orthotics use re-ed (PRAFO and AFO) to inhibit plantar flexion or right ankle while sleeping and during mobility, bathtub transfer in ADL apartment, discharge planning.   Pt educated on bathtub transfers using tub bench as new activity in prep for discharge.   Pt reports that their home is rented but her husband will process home measurements worksheet as requested to improve simulation of home environment for therapies.   Pt able to ambulate with hand held guidance from doorway to tub bench with extra time, manual facilitation to weight-shift from right to left LE, mod vc for technique of advancing right leg with therapist blocking as needed to improve stability.   Pt initially reported pain from transfer but pain resolved while she bathed seated on tub bench.   Pt progressed through shower and dressing task, seated, standing with steadying assist only to wash her buttocks and to pull up her underwear and pants.    Pt reports that she typically never unties her shoes and she demonstrated ability to don left shoe independently after lace was tied this date.     Therapy Documentation Precautions:  Precautions Precautions: Fall Precaution Comments: R  flexor synergy in LE during gait; motor apraxia; R hemiplegia; R inattention Restrictions Weight Bearing Restrictions: No   Pain: Pain Assessment Pain Assessment: No/denies pain  ADL: ADL ADL Comments: see Functional Assessment Exercises:   Other Treatments:    Function:   Eating Eating Eating Assist Level: Supervision or verbal cues    Grooming Oral Care,Brush Teeth, Clean Dentures Activity:      Assist Level: Supervision or verbal cues      Wash, Rinse, Dry Face Activity   Assist Level: Supervision or verbal cues      Wash, Rinse, Dry Hands Activity   Assist Level: Supervision or verbal cues      Brush, Comb Hair Activity   Assist Level: Set up to obtain items    Shave Activity          Apply Makeup Activity                                                             Bathing Bathing position   Position: Other (comment) (Tub bench in ADL apartment)  Bathing parts Body parts bathed by patient: Right arm;Chest;Abdomen;Front perineal area;Buttocks;Right upper leg;Left upper leg;Right lower leg;Left lower leg Body parts bathed by helper: Left arm;Back  Bathing assist Assist Level: Touching or steadying assistance(Pt > 75%)       Upper Body  Dressing/Undressing Upper body dressing   What is the patient wearing?: Bra;Pull over shirt/dress Bra - Perfomed by patient: Thread/unthread left bra strap;Hook/unhook bra (pull down sports bra) Bra - Perfomed by helper: Thread/unthread right bra strap Pull over shirt/dress - Perfomed by patient: Thread/unthread right sleeve;Thread/unthread left sleeve;Put head through opening;Pull shirt over trunk          Upper body assist Assist Level: Touching or steadying assistance(Pt > 75%)       Lower Body Dressing/Undressing Lower body dressing   What is the patient wearing?: Underwear;Pants;Socks;Shoes Underwear - Performed by patient: Thread/unthread right underwear leg;Thread/unthread left underwear leg Underwear -  Performed by helper: Pull underwear up/down Pants- Performed by patient: Thread/unthread right pants leg;Thread/unthread left pants leg;Pull pants up/down       Socks - Performed by patient: Don/doff right sock;Don/doff left sock   Shoes - Performed by patient: Don/doff left shoe Shoes - Performed by helper: Don/doff right shoe          Lower body assist Assist Level: Touching or steadying assistance (Pt > 75%)       Toileting Toileting          Toileting assist      Bed Mobility Roll left and right activity   Assist level: Supervision or verbal cues  Sit to lying activity      Lying to sitting activity   Assist level: Supervision or verbal cues  Mobility details     Transfers Sit to stand transfer   Sit to stand assist level: Moderate assist (Pt 50 - 74%/lift 2 legs/lift or lower) Sit to stand assistive device: Armrests  Chair/bed transfer   Chair/bed transfer method: Stand pivot Chair/bed transfer assist level: Moderate assist (Pt 50 - 74%/lift or lower)        Toilet transfer                Tub/shower transfer   Tub/shower assistive device: Tub transfer bench;Grab bars   Assist level into tub: Moderate assist (Pt 50 - 74%/lift or lower/lift 2 legs)     Cognition Comprehension Comprehension assist level: Understands complex 90% of the time/cues 10% of the time  Expression Expression assist level: Expresses complex 90% of the time/cues < 10% of the time  Social Interaction Social Interaction assist level: Interacts appropriately with others - No medications needed.  Problem Solving    Memory Memory assist level: Recognizes or recalls 90% of the time/requires cueing < 10% of the time    Therapy/Group: Individual Therapy  [place] 06/11/2015, 10:53 AM

## 2015-06-11 NOTE — Progress Notes (Signed)
Orthopedic Tech Progress Note Patient Details:  Ashley [name] [birth year] [id number]  Patient ID: Ashley [name], female   DOB: 1976/06/11, 39 y.o.   MRN: [id number] Called in advanced brace order; spoke with Ashley [name], Ashley [name] 06/11/2015, 9:35 AM

## 2015-06-12 ENCOUNTER — Inpatient Hospital Stay (HOSPITAL_COMMUNITY): Payer: BLUE CROSS/BLUE SHIELD | Admitting: Speech Pathology

## 2015-06-12 ENCOUNTER — Inpatient Hospital Stay (HOSPITAL_COMMUNITY): Payer: BLUE CROSS/BLUE SHIELD | Admitting: Physical Therapy

## 2015-06-12 ENCOUNTER — Inpatient Hospital Stay (HOSPITAL_COMMUNITY): Payer: BLUE CROSS/BLUE SHIELD

## 2015-06-12 DIAGNOSIS — M62471 Contracture of muscle, right ankle and foot: Secondary | ICD-10-CM

## 2015-06-12 NOTE — Progress Notes (Signed)
Speech Language Pathology Weekly Progress and Session Note  Patient Details  Name: Ashley [name] MRN: [id number] Date of Birth: 1976-10-09  Beginning of progress report period: June 05, 2015 End of progress report period: June 12, 2015  Today's Date: 06/12/2015 SLP Individual Time: 1100-1200 SLP Individual Time Calculation (min): 60 min  Short Term Goals: Week 1: SLP Short Term Goal 1 (Week 1): Pt will consume her currently prescribed diet with mod I use of swallowing precautions.   SLP Short Term Goal 1 - Progress (Week 1): Met SLP Short Term Goal 2 (Week 1): Pt will monitor and correct verbal errors during structured and unstructured tasks with min assist verbal cues.   SLP Short Term Goal 2 - Progress (Week 1): Met SLP Short Term Goal 3 (Week 1): Pt will follow abstract, multi-step commands during functional tasks with min assist verbal cues in 75% of observable opportunities.  SLP Short Term Goal 3 - Progress (Week 1): Met SLP Short Term Goal 4 (Week 1): Pt will return demonstration of at least 2 safety precautions during functional tasks with min assist verbal cues. SLP Short Term Goal 4 - Progress (Week 1): Met    New Short Term Goals: Week 2: SLP Short Term Goal 1 (Week 2): Pt will monitor and correct verbal errors during structured and unstructured tasks with mod I.    SLP Short Term Goal 2 (Week 2): Pt will follow abstract, multi-step commands during functional tasks with supervision cues in 75% of observable opportunities.  SLP Short Term Goal 3 (Week 2): Pt will return demonstration of at least 2 safety precautions during functional tasks with supervision cues. SLP Short Term Goal 4 (Week 2): Pt will recognize and correct errors in the moment during semi-complex self care and/or home management tasks with min assist verbal cues.  SLP Short Term Goal 5 (Week 2): Pt will utilize compensatory memory aids to facilitate recall of new information in >75% of observable  opportunities with min assist verbal cues.   Weekly Progress Updates:  Pt made functional gains this reporting period and has met 4 out of 4 short term goals.  Pt is currently able to convey her needs and wants with overall supervision cues for awareness of verbal errors.  Pt is also min assist for basic to semi-complex cognitive tasks due to decreased recall of new information, decreased emergent/anticipatory awareness of deficits, and decreased functional problem solving.  Pt has been advanced to intermittent supervision during meals due to improved mentation. Pt would continue to benefit from skilled ST while inpatient in order to maximize her functional independence and reduce burden of care prior to discharge.  Continue to anticipate that pt will need 24/7 supervision at discharge and assistance for medication and financial management in addition to ST follow up at next level of care.  Pt and family education is ongoing.     Intensity: Minumum of 1-2 x/day, 30 to 90 minutes Frequency: 3 to 5 out of 7 days Duration/Length of Stay: 14-21 days  Treatment/Interventions: Cognitive remediation/compensation;Cueing hierarchy;Dysphagia/aspiration precaution training;Internal/external aids;Environmental controls;Functional tasks;Speech/Language facilitation;Patient/family education;Multimodal communication approach   Daily Session  Skilled Therapeutic Interventions: Pt was seen for skilled ST targeting communication goals.  Upon arrival, pt was seated upright in wheelchair, awake, alert, and agreeable to participate in [place].  Pt was re-scheduling her newborn son's pediatrician appointment and coordinating changes to schedule with husband via phone.  Pt was able to clearly convey her needs and wants over the phone with supervision/mod I use  of compensatory strategies.  SLP also facilitated the session with a structured story generation activity targeting use of a compensatory strategies for word finding in a  less familiar context.  Pt utilized strategies during the abovementioned task with supervision cues and increased time.  Pt generated simple but grammatically correct sentences with subtle perseveration of sentence format.  Pt appeared to be aware of perseveration as evidenced by her self initiated attempts to correct with more diverse sentence structures.  At the end of today's session, pt was returned to room and left in recliner.  During transfer from wheelchair to recliner, pt returned demonstration of safety precautions with min assist cues.  Pt is making excellent progress.  Goals updated on this date to reflect current progress and plan of care.       Function:   Eating Eating                 Cognition Comprehension Comprehension assist level: Understands complex 90% of the time/cues 10% of the time  Expression   Expression assist level: Expresses basic needs/ideas: With extra time/assistive device  Social Interaction Social Interaction assist level: Interacts appropriately 90% of the time - Needs monitoring or encouragement for participation or interaction.  Problem Solving Problem solving assist level: Solves basic 75 - 89% of the time/requires cueing 10 - 24% of the time  Memory Memory assist level: Recognizes or recalls 75 - 89% of the time/requires cueing 10 - 24% of the time   General    Pain Pain Assessment Pain Assessment: No/denies pain  Therapy/Group: Individual Therapy  , Selinda Orion 06/12/2015, 3:17 PM

## 2015-06-12 NOTE — Progress Notes (Signed)
Occupational Therapy Weekly Progress Note  Patient Details  Name: Ashley [name] MRN: [id number] Date of Birth: January 12, 1976  Beginning of progress report period: June 06, 2015 End of progress report period: June 12, 2015  Today's Date: 06/12/2015 OT Individual Time: 2534-0627 OT Individual Time Calculation (min): 90 min   Patient has met 4 of 5 short term goals.  Pt is making good progress toward all long-term goals however requires mod vc to incorporate use of R-UE during BADL d/t low frustration tolerance.   Pt was educated on need to attempt functional tasks that are not time-sensitive using right hand and was provided wash mit and large grip silverware to facilitate improved [facility] of dominant (right) extremity.  Patient continues to demonstrate the following deficits: Impaired dynamic standing balance, impaired transfers skills, impaired gross/FMC of right UE and impaired attention to right and therefore will continue to benefit from skilled OT intervention to enhance overall performance with BADL.  Patient progressing toward long term goals..  Continue plan of care.  OT Short Term Goals Week 1:  OT Short Term Goal 1 (Week 1): Pt will sequence bathing 10 out of 10 parts with min questioning cues OT Short Term Goal 1 - Progress (Week 1): Met OT Short Term Goal 2 (Week 1): Pt will complete toilet transfer with min assist to place right L/UE OT Short Term Goal 2 - Progress (Week 1): Met OT Short Term Goal 3 (Week 1): Pt will complete sit>stand in prep for lower body B & D with max  OT Short Term Goal 3 - Progress (Week 1): Met OT Short Term Goal 4 (Week 1): Pt will use right UE as gross assist during functional task with min instructional cues. OT Short Term Goal 4 - Progress (Week 1): Progressing toward goal OT Short Term Goal 5 - Progress (Week 1): Met Week 2:  OT Short Term Goal 1 (Week 2): Pt will completed 2 of 3 toileting tasks with min assist OT Short Term Goal 2 (Week 2): Pt will  demo ability to maintain supported standing balance with min assist while performing self-care task OT Short Term Goal 3 (Week 2): Pt will demo ability to complete HEP for continued NMR of RUE with supervision OT Short Term Goal 4 (Week 2): Pt will demo ability to don bra with mod vc using adapted dressing technique OT Short Term Goal 5 (Week 2): Pt will demo ability to self-feed using right hand 25% of selected meal  Skilled Therapeutic Interventions/Progress Updates:   ADL-retraining (75 min), tub/shower level, with focus on improved efficiency with stand-pivot and side step transfers, adapted bathing/dressing skills, and NMR of RUE.   Pt completes bathing/dressing in bathroom with overall min-mod assist, manual facilitation to weight-shift and place RLE, and extra time, sitting and standing to wash buttocks/periare and pull up underwear/pants.    NMR of RUE (15 min) provided to focus on inhibition of shoulder flexor hypertonicity, principles of HEP using tabletop, towels, and bilateral method to enhance sensory/motor recovery.   Pt requests continued focus on RUE NMR due to her frustration with trying to use extremity during functional tasks.  Therapy Documentation Precautions:  Precautions Precautions: Fall Precaution Comments: R flexor synergy in LE during gait; motor apraxia; R hemiplegia; R inattention Restrictions Weight Bearing Restrictions: No Therapy Vitals Temp: 97.5 F (36.4 C) Temp Source: Oral Pulse Rate: 82 Resp: 18 BP: 100/62 mmHg Patient Position (if appropriate): Lying Oxygen Therapy SpO2: 100 % O2 Device: Not Delivered   Pain:  Pain Assessment Pain Assessment: No/denies pain   ADL: ADL ADL Comments: see Functional Assessment  Function:   Eating Eating               Grooming Oral Care,Brush Teeth, Clean Dentures Activity:      Assist Level: Set up   Set up : To apply toothpaste  Wash, Rinse, Dry Face Activity   Assist Level: More than reasonable  time      Wash, Rinse, Dry Hands Activity   Assist Level: More than reasonable time      Brush, Comb Hair Activity   Assist Level: More than reasonable time    Shave Activity          Apply Makeup Activity                                                             Bathing Bathing position   Position: Shower  Bathing parts Body parts bathed by patient: Right arm;Left arm;Chest;Abdomen;Front perineal area;Buttocks;Right upper leg;Left upper leg;Right lower leg;Left lower leg Body parts bathed by helper: Back  Bathing assist Assist Level: Touching or steadying assistance(Pt > 75%)       Upper Body Dressing/Undressing Upper body dressing   What is the patient wearing?: Bra;Pull over shirt/dress Bra - Perfomed by patient: Thread/unthread left bra strap;Hook/unhook bra (pull down sports bra) Bra - Perfomed by helper: Thread/unthread right bra strap Pull over shirt/dress - Perfomed by patient: Thread/unthread right sleeve;Thread/unthread left sleeve;Put head through opening;Pull shirt over trunk          Upper body assist Assist Level: Touching or steadying assistance(Pt > 75%)       Lower Body Dressing/Undressing Lower body dressing   What is the patient wearing?: Underwear;Pants;Socks;Shoes Underwear - Performed by patient: Thread/unthread right underwear leg;Thread/unthread left underwear leg Underwear - Performed by helper: Pull underwear up/down Pants- Performed by patient: Thread/unthread right pants leg;Thread/unthread left pants leg;Pull pants up/down;Fasten/unfasten pants       Socks - Performed by patient: Don/doff right sock;Don/doff left sock   Shoes - Performed by patient: Don/doff left shoe Shoes - Performed by helper: Don/doff right shoe          Lower body assist Assist Level: Touching or steadying assistance (Pt > 75%)       Toileting Toileting          Toileting assist      Bed Mobility Roll left and right activity      Sit to  lying activity      Lying to sitting activity      Mobility details     Transfers Sit to stand transfer        Chair/bed transfer              Toilet transfer                Tub/shower transfer   Tub/shower assistive device: Tub transfer bench;Grab bars   Assist level into tub: Touching or steadying assistance (Pt > 75%/lift 1 leg) Assist level out of tub: Touching or steadying assistance (Pt > 75%)   Cognition Comprehension Comprehension assist level: Understands complex 90% of the time/cues 10% of the time  Expression Expression assist level: Expresses basic needs/ideas: With extra time/assistive device  Social Interaction Social Interaction  assist level: Interacts appropriately 90% of the time - Needs monitoring or encouragement for participation or interaction.  Problem Solving Problem solving assist level: Solves basic 50 - 74% of the time/requires cueing 25 - 49% of the time  Memory Memory assist level: Recognizes or recalls 50 - 74% of the time/requires cueing 25 - 49% of the time    Therapy/Group: Individual Therapy  [place] 06/12/2015, 2:42 PM

## 2015-06-12 NOTE — Progress Notes (Signed)
Recreational Therapy Assessment and Plan  Patient Details  Name: Ashley [name] MRN: [id number] Date of Birth: [birth year] Today's Date: 06/12/2015  Rehab Potential: Excellent ELOS: 2 weeks   Assessment Problem List:  Patient Active Problem List   Diagnosis Date Noted  . Right flaccid hemiparesis 05/23/2015  . ICH (intracerebral hemorrhage)   . Seizure disorder, nonconvulsive, with status epilepticus   . Cerebral venous thrombosis of cortical vein   . Cytotoxic cerebral edema   . IVH (intraventricular hemorrhage)   . Term pregnancy 05/07/2015  . Spontaneous vaginal delivery 05/07/2015    Past Medical History:  Past Medical History  Diagnosis Date  . Anxiety    Past Surgical History:  Past Surgical History  Procedure Laterality Date  . Dilation and curettage of uterus      Assessment & Plan Clinical Impression: Patient is a 39 y.o. postpartum female originally admitted on 05/20/15 with complaints of HA since delivery On 06/02/15 am, patient developed seizures with obtundation. She was noted to be severely hypotensive and was treated with fluid bolus as well as IV Keppra and IV antibiotics due to concerns of sepsis. She was transferred to stepdown unit for closer monitoring. Follow up CT head showed resolving left frontoparietal hemorrhage. Keppra was increased to 1500 mg bid and patient was back to baseline later than am. Dr. Doy Mince recommended treating infection and recommended use of ativan alone for treatment of anxiety/insomnia. Patient has been seizure free and has had improvement in use of RUE as well as verbal output. Therapy was re-initiated this am and patient re-admitted back to complete her rehab course. Patient returns to CIR on 06/04/2015 .      Pt presents with decreased activity tolerance, decreased functional mobility, decreased balance, decreased coordination, right inattention, decreased awareness, decreased  problem solving, decreased memory, decreased safety, aphasia Limiting pt's independence with leisure/community pursuits.   Leisure History/Participation Premorbid leisure interest/current participation: Sports - Exercise (Comment);Community - Doctor, hospital - Grocery store;Community - Travel (Comment) (running, anything with my kids) Expression Interests: Music (Comment);Dance (dancing) Other Leisure Interests: Television;Computer Leisure Participation Style: With Family/Friends Awareness of Community Resources: Excellent Psychosocial / Spiritual Patient agreeable to Pet Therapy: Yes Does patient have pets?: Yes Social interaction - Mood/Behavior: Cooperative Academic librarian Appropriate for Education?: Yes Patient Agreeable to Outing?: Yes Recreational Therapy Orientation Orientation -Reviewed with patient: Available activity resources Strengths/Weaknesses Patient Strengths/Abilities: Willingness to participate;Active premorbidly Patient weaknesses: Physical limitations TR Patient demonstrates impairments in the following area(s): Endurance;Motor;Pain  Plan Rec Therapy Plan Is patient appropriate for Therapeutic Recreation?: Yes Rehab Potential: Excellent Treatment times per week: Min 1 time per week >20 minutes Estimated Length of Stay: 2 weeks TR Treatment/Interventions: Adaptive equipment instruction;1:1 session;Balance/vestibular training;Functional mobility training;Community reintegration;Cognitive remediation/compensation;Patient/family education;Therapeutic activities;Recreation/leisure participation;Therapeutic exercise;UE/LE Coordination activities Recommendations for other services: Neuropsych  Recommendations for other services: Neuropsych  Discharge Criteria: Patient will be discharged from TR if patient refuses treatment 3 consecutive times without medical reason.  If treatment goals not met, if there is a change in medical status, if patient makes no  progress towards goals or if patient is discharged from hospital.  The above assessment, treatment plan, treatment alternatives and goals were discussed and mutually agreed upon: by patient  [place] 06/12/2015, 2:38 PM

## 2015-06-12 NOTE — Progress Notes (Signed)
Physical Therapy Weekly Progress Note  Patient Details  Name: Ashley [name] MRN: [id number] Date of Birth: 10-Jun-1976  Beginning of progress report period: June 05, 2015 End of progress report period: June 12, 2015  Today's Date: 06/12/2015 PT Individual Time: 2025-4270 PT Individual Time Calculation (min): 54 min   Patient has made excellent progress and has met 5 of 5 short term goals.  Pt is currently supervision-min A for bed mobility, min A bed <> chair transfers, supervision for w/c mobility with intermittent cues to attend to R environment, and mod A for gait with RW and stair negotiation.  Pt has been evaluated for R AFO to assist with foot clearance.    Patient continues to demonstrate the following deficits: R hemiparesis with hypertonicity, R inattention, impaired memory, attention, motor apraxia with impaired coordination, initiation and muscle grading, impaired postural control, balance, gait and therefore will continue to benefit from skilled PT intervention to enhance overall performance with balance, postural control, ability to compensate for deficits, functional use of  right upper extremity and right lower extremity, attention, awareness and coordination.  Patient progressing toward long term goals..  Continue plan of care.  PT Short Term Goals Week 1:  PT Short Term Goal 1 (Week 1): Pt will transfer w/c to/from bed req min A via stand-step transfer to pt's L. PT Short Term Goal 1 - Progress (Week 1): Met PT Short Term Goal 2 (Week 1): Pt will demonstrate rolling L in flat bed req min A without rails PT Short Term Goal 2 - Progress (Week 1): Met PT Short Term Goal 3 (Week 1): Pt will ambulate >= 50' with Eva walker req 1-2 person assist PT Short Term Goal 3 - Progress (Week 1): Met PT Short Term Goal 4 (Week 1): Pt will demonstrate appropriate sequencing with min cues for sit to stand req min A PT Short Term Goal 4 - Progress (Week 1): Met PT Short Term Goal 5 (Week  1): Pt will initiate stair training during PT session.  PT Short Term Goal 5 - Progress (Week 1): Met Week 2:  PT Short Term Goal 1 (Week 2): Pt will perform bed mobility on flat bed, no rails with supervision and min cues for sequencing PT Short Term Goal 2 (Week 2): Pt will perform transfers bed <> chair with consistent min A and min cues for sequencing PT Short Term Goal 3 (Week 2): Pt will ambulate 150' with RW in controlled and community environment with min A and min cues for sequencing/initiation PT Short Term Goal 4 (Week 2): Pt will negotiate 8 steps with 2 rails and min A PT Short Term Goal 5 (Week 2): Pt will perform w/c mobility outside and in community environment x 200' with supervision  Skilled Therapeutic Interventions/Progress Updates:   Pt received in recliner asking to use toilet to urinate.  Performed ambulation with RW to/from toilet as described below but without AFO RLE with increased R genu recurvatum in stance.  Performed toileting as below with min A for balance and sequencing.  Performed hand hygiene standing at sink with min A and verbal cues for use of RUE during functional task.  Orthotist present to assess gait for R AFO.  To return tomorrow with both a PLS and Toe Off AFO to trial.  Pt performed w/c mobility to gym as below.  Performed NMR in tall kneeling as below with Recreation therapist.  Returned to room and transferred back to bed and to supine as below; required  min A and cues for use of bilat LE/bridging to reposition in supine.  Pt left in bed with all items within reach.  Therapy Documentation Precautions:  Precautions Precautions: Fall Precaution Comments: R flexor synergy in LE during gait; motor apraxia; R hemiplegia; R inattention Restrictions Weight Bearing Restrictions: No Vital Signs: Therapy Vitals Temp: 97.5 F (36.4 C) Temp Source: Oral Pulse Rate: 82 Resp: 18 BP: 100/62 mmHg Patient Position (if appropriate): Lying Oxygen Therapy SpO2:  100 % O2 Device: Not Delivered Pain: Pain Assessment Pain Assessment: No/denies pain Other Treatments: Treatments Neuromuscular Facilitation: Right;Upper Extremity;Activity to increase coordination;Activity to increase motor control;Activity to increase timing and sequencing;Activity to increase grading;Activity to increase sustained activation;Activity to increase lateral weight shifting;Activity to increase anterior-posterior weight shifting in tall kneeling on mat with UE support during lateral weight shifting and RLE advancement forwards/back; also performed dynamic reaching with L and then RUE (with facilitation of R scapula upward rotation during RUE flexion) to continue to facilitate lateral weight shifting and trunk/postural control.   Function:  Engineer, materials steps completed by patient: Adjust clothing prior to toileting;Performs perineal hygiene;Adjust clothing after toileting   Toileting Assistive Devices: Grab bar or rail Assist level: Touching or steadying assistance (Pt.75%)   Bed Mobility Roll left and right activity   Assist level: Supervision or verbal cues    Sit to lying activity   Assist level: Touching or steadying assistance (Pt > 75%, lift 1 leg)    Lying to sitting activity        Mobility details Bed mobility details: Tactile cues for initiation;Tactile cues for sequencing;Verbal cues for techniques;Verbal cues for sequencing;Manual facilitation for placement   Transfers Sit to stand transfer   Sit to stand assist level: Touching or steadying assistance (Pt > 75%/lift 1 leg) Sit to stand assistive device: Armrests  Chair/bed transfer   Chair/bed transfer method: Stand pivot Chair/bed transfer assist level: Touching or steadying assistance (Pt > 75%) Chair/bed transfer assistive device: Armrests   Chair/bed transfer details: Tactile cues for weight shifting;Tactile cues for posture;Tactile cues for placement;Verbal cues for  sequencing;Verbal cues for technique;Verbal cues for precautions/safety;Manual facilitation for weight shifting;Manual facilitation for placement;Manual facilitation for weight bearing   Toilet transfer   Toilet transfer assistive device: Walker   Assist level to toilet: Moderate assist (Pt 50 - 74%/lift or lower) Assist level from toilet: Moderate assist (Pt 50 - 74%/lift or lower)      Car transfer         Locomotion Ambulation   Assistive device: Walker-rolling Max distance: 25 Assist level: Moderate assist (Pt 50 - 74%)  Walk 10 feet activity   Assist level: Moderate assist (Pt 50 - 74%)  Walk 50 feet with 2 turns activity      Walk 150 feet activity      Walk 10 feet on uneven surfaces activity      Stairs          Walk up/down 1 step activity        Walk up/down 4 steps activity      Walk up/down 12 steps activity      Pick up small objects from floor      Wheelchair   Type: Manual Max wheelchair distance: 150 Assist Level: Supervision or verbal cues  Wheel 50 feet with 2 turns activity   Assist Level: Supervision or verbal cues  Wheel 150 feet activity   Assist Level: Supervision or verbal cues    Cognition Comprehension  Comprehension assist level: Understands complex 90% of the time/cues 10% of the time  Expression Expression assist level: Expresses basic needs/ideas: With extra time/assistive device  Social Interaction Social Interaction assist level: Interacts appropriately 90% of the time - Needs monitoring or encouragement for participation or interaction.  Problem Solving Problem solving assist level: Solves basic 50 - 74% of the time/requires cueing 25 - 49% of the time  Memory Memory assist level: Recognizes or recalls 50 - 74% of the time/requires cueing 25 - 49% of the time    Therapy/Group: Individual Therapy  Raylene Everts [place] 06/12/2015, 3:10 PM

## 2015-06-12 NOTE — Progress Notes (Signed)
Subjective/Complaints:Orhtotist supposed to be evaluating RLE today No new issues overnite  ROS denies CP, SOB, Abd pain, no ankle or foot pain, bowel and bladder ok   but no detail related to aphasia Objective: Vital Signs: Blood pressure 105/65, pulse 80, temperature 98.4 F (36.9 C), temperature source Oral, resp. rate 17, SpO2 97 %, unknown if currently breastfeeding. No results found. No results found for this or any previous visit (from the past 72 hour(s)).   HEENT: normal Cardio: RRR and no murmur Resp: CTA B/L and unlabored GI: BS positive and NT, ND Extremity:  BS positive and NT, ND Skin:   Central line site healed over Neuro: Alert/Oriented, Abnormal Sensory reduced sensation to LT and proprio RUE (mainly thumb)  and RLE , Left side 4-5/5, Abnormal Motor 3-/5 RUE grip, 3- bi and tri ,3- at delt, 2- hip/knee ext RLE and Expressive Aphasic, Ash[place] 1 + at biceps femoris, 3 at ankle PF with tight heel cord,  Has difficulty initiating movements. Some apraxia noted Musc/Skel:  Other no pain with UE or LE ROM, mildly inverted at ankle, no ankle swelling Gen NAD   Assessment/Plan: 1. Functional deficits secondary to Right spastic hemiplegia,dysphagia, and aphasia secondary to large left parietal intracranial hemorrhage   which require 3+ hours per day of interdisciplinary therapy in a comprehensive inpatient rehab setting. Physiatrist is providing close team supervision and 24 hour management of active medical problems listed below. Physiatrist and rehab team continue to assess barriers to discharge/monitor patient progress toward functional and medical goals.   FIM: Function - Bathing Position: Other (comment) (Tub bench in ADL apartment) Body parts bathed by patient: Right arm, Chest, Abdomen, Front perineal area, Buttocks, Right upper leg, Left upper leg, Right lower leg, Left lower leg Body parts bathed by helper: Left arm, Back Assist Level: Touching or steadying  assistance(Pt > 75%) Set up : To obtain items, To open containers, To adjust water temperature  Function- Upper Body Dressing/Undressing What is the patient wearing?: Bra, Pull over shirt/dress Bra - Perfomed by patient: Thread/unthread left bra strap, Hook/unhook bra (pull down sports bra) Bra - Perfomed by helper: Thread/unthread right bra strap Pull over shirt/dress - Perfomed by patient: Thread/unthread right sleeve, Thread/unthread left sleeve, Put head through opening, Pull shirt over trunk Pull over shirt/dress - Perfomed by helper: Thread/unthread right sleeve Assist Level: Touching or steadying assistance(Pt > 75%) Set up : To obtain clothing/put away Function - Lower Body Dressing/Undressing What is the patient wearing?: Underwear, Pants, Socks, Shoes Position: Wheelchair/chair at sink Underwear - Performed by patient: Thread/unthread right underwear leg, Thread/unthread left underwear leg Underwear - Performed by helper: Pull underwear up/down Pants- Performed by patient: Thread/unthread right pants leg, Thread/unthread left pants leg, Pull pants up/down Pants- Performed by helper: Pull pants up/down Non-skid slipper socks- Performed by patient: Don/doff right sock, Don/doff left sock Socks - Performed by patient: Don/doff right sock, Don/doff left sock Shoes - Performed by patient: Don/doff left shoe Shoes - Performed by helper: Don/doff right shoe Assist Level: Touching or steadying assistance (Pt > 75%) Set up : To obtain clothing/put away  Function - Toileting Toileting activity did not occur: Safety/medical concerns Toileting steps completed by patient: Performs perineal hygiene Toileting steps completed by helper: Adjust clothing prior to toileting, Adjust clothing after toileting Toileting Assistive Devices: Grab bar or rail Assist level: Touching or steadying assistance (Pt.75%)  Function - Toilet Transfers Toilet transfer activity did not occur: Refused Toilet  transfer assistive device: Facilities manager  lift: Stedy Assist level to toilet: Touching or steadying assistance (Pt > 75%) Assist level from toilet: Touching or steadying assistance (Pt > 75%) Assist level to bedside commode (at bedside): 2 helpers Assist level from bedside commode (at bedside): Moderate assist (Pt 50 - 74%/lift or lower)  Function - Chair/bed transfer Chair/bed transfer method: Stand pivot Chair/bed transfer assist level: Moderate assist (Pt 50 - 74%/lift or lower) Chair/bed transfer assistive device: Walker Chair/bed transfer details: Tactile cues for initiation, Tactile cues for sequencing, Tactile cues for weight shifting, Tactile cues for posture, Tactile cues for placement, Verbal cues for sequencing, Verbal cues for safe use of DME/AE, Manual facilitation for weight shifting, Manual facilitation for placement, Manual facilitation for weight bearing  Function - Locomotion: Wheelchair Will patient use wheelchair at discharge?: Yes Type: Manual Max wheelchair distance: 150 Assist Level: Supervision or verbal cues Assist Level: Supervision or verbal cues Assist Level: Supervision or verbal cues Function - Locomotion: Ambulation Assistive device: Walker-rolling Max distance: 75 Assist level: Moderate assist (Pt 50 - 74%) Assist level: Moderate assist (Pt 50 - 74%) Walk 50 feet with 2 turns activity did not occur: Safety/medical concerns Assist level: Moderate assist (Pt 50 - 74%) Walk 150 feet activity did not occur: Safety/medical concerns Assist level: 2 helpers Walk 10 feet on uneven surfaces activity did not occur: Safety/medical concerns  Function - Comprehension Comprehension: Auditory Comprehension assist level: Understands complex 90% of the time/cues 10% of the time  Function - Expression Expression: Verbal Expression assist level: Expresses basic needs/ideas: With extra time/assistive device  Function - Social Interaction Social  Interaction assist level: Interacts appropriately 90% of the time - Needs monitoring or encouragement for participation or interaction.  Function - Problem Solving Problem solving assist level: Solves basic 50 - 74% of the time/requires cueing 25 - 49% of the time  Function - Memory Memory assist level: Recognizes or recalls 50 - 74% of the time/requires cueing 25 - 49% of the time Patient normally able to recall (first 3 days only): That he or she is in a hospital, Location of own room, Current season  Medical Problem List and Plan: 1. Functional deficits secondary to Right flaccid hemiplegia,dysphagia, and aphasia secondary to large left parietal intracranial hemorrhage, also severe cog def, spasticity , has decreased sensation in R thumb and R toes  -increased baclofen to 5mg  tid with some improvement, no sedation  -would benefit from AFO RLE---await eval 2. DVT Prophylaxis/Anticoagulation: Pharmaceutical: Lovenox 3. Pain Management: Tylenol prn 4. Mood: LCSW to follow for evaluation and support as cognition/aphasia improves.  5. Neuropsych: This patient is not capable of making decisions on her own behalf. 6. Skin/Wound Care: Routine pressure relief measures.  7. Fluids/Electrolytes/Nutrition: Monitor I/O.80-100% meals , 744ml fluids   8. Insomnia: improved on trazodone 9. New onset seizures: with breakthrough now controlled on keppra 1500mg  bid., monitor valproate level ,no further seizures since readmit 10. Hx anxiety , back on prozac 10mg --appears under control 11.  Ankle contracture- heel cord, Cont night time PRAFO LOS (Days) 8 A FACE TO FACE EVALUATION WAS PERFORMED  KIRSTEINS,ANDREW E 06/12/2015, 8:23 AM   .akp

## 2015-06-13 ENCOUNTER — Inpatient Hospital Stay (HOSPITAL_COMMUNITY): Payer: BLUE CROSS/BLUE SHIELD | Admitting: *Deleted

## 2015-06-13 ENCOUNTER — Inpatient Hospital Stay (HOSPITAL_COMMUNITY): Payer: BLUE CROSS/BLUE SHIELD | Admitting: Physical Therapy

## 2015-06-13 ENCOUNTER — Inpatient Hospital Stay (HOSPITAL_COMMUNITY): Payer: BLUE CROSS/BLUE SHIELD | Admitting: Occupational Therapy

## 2015-06-13 ENCOUNTER — Inpatient Hospital Stay (HOSPITAL_COMMUNITY): Payer: BLUE CROSS/BLUE SHIELD | Admitting: Speech Pathology

## 2015-06-13 MED ORDER — BACLOFEN 10 MG PO TABS
5.0000 mg | ORAL_TABLET | Freq: Two times a day (BID) | ORAL | Status: DC
  Administered 2015-06-13 – 2015-06-15 (×4): 5 mg via ORAL
  Filled 2015-06-13 (×4): qty 1

## 2015-06-13 NOTE — Progress Notes (Signed)
Physical Therapy Session Note  Patient Details  Name: Ashley [name] MRN: [id number] Date of Birth: 1975-12-27  Today's Date: 06/13/2015 PT Individual Time: 0930-1030 Treatment Session 2: 1330-1400 PT Individual Time Calculation (min): 60 min Treatment Session 2: 30 min  Short Term Goals: Week 2:  PT Short Term Goal 1 (Week 2): Pt will perform bed mobility on flat bed, no rails with supervision and min cues for sequencing PT Short Term Goal 2 (Week 2): Pt will perform transfers bed <> chair with consistent min A and min cues for sequencing PT Short Term Goal 3 (Week 2): Pt will ambulate 150' with RW in controlled and community environment with min A and min cues for sequencing/initiation PT Short Term Goal 4 (Week 2): Pt will negotiate 8 steps with 2 rails and min A PT Short Term Goal 5 (Week 2): Pt will perform w/c mobility outside and in community environment x 200' with supervision  Skilled Therapeutic Interventions/Progress Updates:    Treatment Session 1: Pt received up in w/c. Gait Training - see below for details. PT used TIS w/c with Rec therapist in it, simulating a stroller, with PT preventing R knee hyperextension in stance in order to facilitate a larger L step length - see below for details. Pt completes a corner flight of practice stairs req repeated cues for R arm attention, using a step-to alternating pattern on ascent and descent. W/C Management - PT instructs pt in w/c propulsion with B UEs, focus on coordination of B arms, forced use of R arm, while steering/navigating in w/c - see below for details. Pt demonstrates good use of R arm, when focused on this arm, and taking a slower amount of time to self propel. PT educates pt re: "Use it or lose it" phenomenon in relation to motor recovery after a neurologic injury.  Treatment Session 2: Neuromuscular Reeducation - PT administers Berg Balance Test and pt scores 21/56, indicating high falls risk. Pt ended in room with mother  present and call bell in reach. Continue per PT POC.    Therapy Documentation Precautions:  Precautions Precautions: Fall Precaution Comments: R flexor synergy in LE during gait; motor apraxia; R hemiplegia; R inattention Restrictions Weight Bearing Restrictions: No Pain: Pain Assessment Pain Assessment: No/denies pain Treatment Session 2: Pt denies pain.   Balance: Balance Balance Assessed: Yes Standardized Balance Assessment Standardized Balance Assessment: Berg Balance Test Berg Balance Test Sit to Stand: Needs minimal aid to stand or to stabilize Standing Unsupported: Able to stand 2 minutes with supervision Sitting with Back Unsupported but Feet Supported on Floor or Stool: Able to sit safely and securely 2 minutes Stand to Sit: Controls descent by using hands Transfers: Needs one person to assist Standing Unsupported with Eyes Closed: Able to stand 10 seconds with supervision Standing Ubsupported with Feet Together: Needs help to attain position but able to stand for 30 seconds with feet together From Standing, Reach Forward with Outstretched Arm: Reaches forward but needs supervision From Standing Position, Pick up Object from Floor: Unable to pick up shoe, but reaches 2-5 cm (1-2") from shoe and balances independently From Standing Position, Turn to Look Behind Over each Shoulder: Needs supervision when turning Turn 360 Degrees: Needs assistance while turning Standing Unsupported, Alternately Place Feet on Step/Stool: Needs assistance to keep from falling or unable to try Standing Unsupported, One Foot in Front: Needs help to step but can hold 15 seconds Standing on One Leg: Unable to try or needs assist to prevent fall Total  Score: 21   Function:  Toileting Toileting             Bed Mobility Roll left and right activity        Sit to lying activity        Lying to sitting activity        Mobility details     Transfers Sit to stand transfer   Sit to  stand assist level: Touching or steadying assistance (Pt > 75%/lift 1 leg) Sit to stand assistive device: Armrests  Chair/bed transfer   Chair/bed transfer method: Stand pivot Chair/bed transfer assist level: Touching or steadying assistance (Pt > 75%) Chair/bed transfer assistive device: Armrests   Chair/bed transfer details: Manual facilitation for weight shifting;Manual facilitation for placement;Verbal cues for sequencing;Verbal cues for technique;Manual facilitation for weight bearing   Toilet transfer                Car transfer          Locomotion Ambulation   Assistive device: Other (comment) (pushing therapist sitting in TIS w/c to simulate stroller) Max distance: 140 Assist level: Moderate assist (Pt 50 - 74%)  Walk 10 feet activity   Assist level: Moderate assist (Pt 50 - 74%)  Walk 50 feet with 2 turns activity   Assist level: Moderate assist (Pt 50 - 74%)  Walk 150 feet activity      Walk 10 feet on uneven surfaces activity      Stairs   Stairs assistive device: 2 hand rails Max number of stairs: 12 Stairs assist level: Moderate assist (Pt 50 - 74%)  Walk up/down 1 step activity     Walk up/down 1 step (curb) assist level: Moderate assist (Pt 50 - 74%)  Walk up/down 4 steps activity   Walk up/down 4 steps assist level: Moderate assist (Pt 50 - 74%)  Walk up/down 12 steps activity   Walk up/down 12 steps assist level: Moderate assist (Pt 50 - 74%)  Pick up small objects from floor      Wheelchair   Type: Manual Max wheelchair distance: 55 (B UEs) Assist Level: Touching or steadying assistance (Pt > 75%)  Wheel 50 feet with 2 turns activity   Assist Level: Touching or steadying assistance (Pt > 75%)  Wheel 150 feet activity       Cognition Comprehension Comprehension assist level: Understands complex 90% of the time/cues 10% of the time  Expression Expression assist level: Expresses basic needs/ideas: With extra time/assistive device  Social  Interaction Social Interaction assist level: Interacts appropriately with others with medication or extra time (anti-anxiety, antidepressant).  Problem Solving Problem solving assist level: Solves basic 75 - 89% of the time/requires cueing 10 - 24% of the time  Memory Memory assist level: Recognizes or recalls 75 - 89% of the time/requires cueing 10 - 24% of the time    Therapy/Group: Individual Therapy and Co-Treatment with Lattie Haw, Rec Therapist first 30 minutes of first treatment  [facility] 06/13/2015, 12:13 PM

## 2015-06-13 NOTE — Progress Notes (Signed)
Occupational Therapy Session Note  Patient Details  Name: Ashley [name] MRN: [id number] Date of Birth: 13-Jun-1976  Today's Date: 06/13/2015 OT Individual Time: 0800-0900 OT Individual Time Calculation (min): 60 min     Skilled Therapeutic Interventions/Progress Updates:    1:1 self care retraining at shower level with focus on sit to stands, functional use of right UE with min A and tactile and verbal cues, standing balance with attention to balance reactions with mod multimodal cuing, stand pivot transfers with min A with step by step cues for sequencing, functional problem solving, standing balance and tolerance during clothing management and performing toothbrushing at sink. Pt with improvement in ankle control in standing; notable decr in rolling right ankle and increased ability to maintain hips at neutral (not rotated) and foot to remain planted on floor. Pt with increased awareness of trying to incorporate right hand in all functional tasks. See functional progress below for performance in ADL tasks.    Therapy Documentation Precautions:  Precautions Precautions: Fall Precaution Comments: R flexor synergy in LE during gait; motor apraxia; R hemiplegia; R inattention Restrictions Weight Bearing Restrictions: No   Pain: Pain Assessment Pain Assessment: No/denies pain ADL: ADL ADL Comments: see Functional Assessment  Function:     Grooming Oral Care,Brush Teeth, Clean Dentures Activity:      Assist Level: Touching or steadying assistance(Pt > 75%) (performed in standing with steady A)      Wash, Rinse, Dry Face Activity   Assist Level: More than reasonable time   Set up : To obtain items  Wash, Rinse, Dry Hands Activity   Assist Level: More than reasonable time      Brush, Comb Hair Activity   Assist Level: More than reasonable time                                                                           Bathing Bathing position   Position: Shower   Bathing parts Body parts bathed by patient: Right arm;Chest;Abdomen;Front perineal area;Left arm;Buttocks;Right upper leg;Left upper leg;Left lower leg Body parts bathed by helper: Back;Right lower leg  Bathing assist Assist Level: Touching or steadying assistance(Pt > 75%)   Set up : To adjust water temperature   Upper Body Dressing/Undressing Upper body dressing   What is the patient wearing?: Bra;Pull over shirt/dress Bra - Perfomed by patient: Thread/unthread left bra strap;Hook/unhook bra (pull down sports bra);Thread/unthread right bra strap (put bra over head)   Pull over shirt/dress - Perfomed by patient: Thread/unthread right sleeve;Thread/unthread left sleeve;Put head through opening;Pull shirt over trunk          Upper body assist Assist Level: Set up   Set up : To obtain clothing/put away   Lower Body Dressing/Undressing Lower body dressing   What is the patient wearing?: Pants;Underwear;Socks;Shoes Underwear - Performed by patient: Thread/unthread right underwear leg;Thread/unthread left underwear leg;Pull underwear up/down   Pants- Performed by patient: Thread/unthread right pants leg;Thread/unthread left pants leg;Pull pants up/down;Fasten/unfasten pants       Socks - Performed by patient: Don/doff right sock;Don/doff left sock   Shoes - Performed by patient: Don/doff left shoe (fasten left n/a) Shoes - Performed by helper: Don/doff right shoe;Fasten right  Lower body assist Assist Level: Touching or steadying assistance (Pt > 75%)   Set up : To obtain clothing/put away   Toileting Toileting   Toileting steps completed by patient: Adjust clothing prior to toileting;Performs perineal hygiene Toileting steps completed by helper: Adjust clothing after toileting Toileting Assistive Devices: Grab bar or rail  Toileting assist Assist level: Touching or steadying assistance (Pt.75%)      Transfers Sit to stand transfer   Sit to stand assist  level: Touching or steadying assistance (Pt > 75%/lift 1 leg) Sit to stand assistive device: Armrests  Chair/bed transfer   Chair/bed transfer method: Stand pivot Chair/bed transfer assist level: Touching or steadying assistance (Pt > 75%) Chair/bed transfer assistive device: Armrests   Chair/bed transfer details: Manual facilitation for weight shifting;Manual facilitation for placement;Verbal cues for sequencing;Verbal cues for technique;Manual facilitation for weight bearing  Toilet transfer   Toilet transfer assistive device: Grab bar       Assist level to toilet: Touching or steadying assistance (Pt > 75%) Assist level from toilet: Touching or steadying assistance (Pt > 75%)  Tub/shower transfer   Tub/shower assistive device: Shower chair   Assist level into tub: Touching or steadying assistance (Pt > 75%/lift 1 leg) Assist level out of tub: Touching or steadying assistance (Pt > 75%)   Cognition Comprehension Comprehension assist level: Understands complex 90% of the time/cues 10% of the time  Expression Expression assist level: Expresses basic needs/ideas: With extra time/assistive device  Social Interaction Social Interaction assist level: Interacts appropriately with others with medication or extra time (anti-anxiety, antidepressant).  Problem Solving Problem solving assist level: Solves basic 75 - 89% of the time/requires cueing 10 - 24% of the time  Memory Memory assist level: Recognizes or recalls 75 - 89% of the time/requires cueing 10 - 24% of the time    Therapy/Group: Individual Therapy  Willeen Cass [facility] 06/13/2015, 1:23 PM

## 2015-06-13 NOTE — Progress Notes (Signed)
Subjective/Complaints: No new issues overnite Feels like coming out of a "fog" ROS denies CP, SOB, Abd pain, , bowel and bladder ok   but no detail related to aphasia Objective: Vital Signs: Blood pressure 91/57, pulse 84, temperature 98.2 F (36.8 C), temperature source Oral, resp. rate 18, weight 63.9 kg (140 lb 14 oz), SpO2 96 %, unknown if currently breastfeeding. No results found. No results found for this or any previous visit (from the past 72 hour(s)).   HEENT: normal Cardio: RRR and no murmur Resp: CTA B/L and unlabored GI: BS positive and NT, ND Extremity:  BS positive and NT, ND Skin:   Central line site healed over, bruising Left side neck Neuro: Alert/Oriented, Abnormal Sensory reduced sensation Right side , Left side 4-5/5, Abnormal Motor 3-/5 RUE grip, 3- bi and tri ,3- at delt, 2- hip/knee ext RLE and Expressive Aphasic, [place] 1 + at biceps femoris, 3 at ankle PF with tight heel cord,   Musc/Skel:  Other no pain with UE or LE ROM, mildly inverted at ankle, no ankle swelling Gen NAD   Assessment/Plan: 1. Functional deficits secondary to Right spastic hemiplegia,dysphagia, and aphasia secondary to large left parietal intracranial hemorrhage   which require 3+ hours per day of interdisciplinary therapy in a comprehensive inpatient rehab setting. Physiatrist is providing close team supervision and 24 hour management of active medical problems listed below. Physiatrist and rehab team continue to assess barriers to discharge/monitor patient progress toward functional and medical goals.  Team conference today please see physician documentation under team conference tab, met with team face-to-face to discuss problems,progress, and goals. Formulized individual treatment plan based on medical history, underlying problem and comorbidities. FIM: Function - Bathing Position: Shower Body parts bathed by patient: Right arm, Left arm, Chest, Abdomen, Front perineal area,  Buttocks, Right upper leg, Left upper leg, Right lower leg, Left lower leg Body parts bathed by helper: Back Assist Level: Touching or steadying assistance(Pt > 75%) Set up : To obtain items, To open containers, To adjust water temperature  Function- Upper Body Dressing/Undressing What is the patient wearing?: Bra, Pull over shirt/dress Bra - Perfomed by patient: Thread/unthread left bra strap, Hook/unhook bra (pull down sports bra) Bra - Perfomed by helper: Thread/unthread right bra strap Pull over shirt/dress - Perfomed by patient: Thread/unthread right sleeve, Thread/unthread left sleeve, Put head through opening, Pull shirt over trunk Pull over shirt/dress - Perfomed by helper: Thread/unthread right sleeve Assist Level: Touching or steadying assistance(Pt > 75%) Set up : To obtain clothing/put away Function - Lower Body Dressing/Undressing What is the patient wearing?: Underwear, Pants, Socks, Shoes Position: Wheelchair/chair at sink Underwear - Performed by patient: Thread/unthread right underwear leg, Thread/unthread left underwear leg Underwear - Performed by helper: Pull underwear up/down Pants- Performed by patient: Thread/unthread right pants leg, Thread/unthread left pants leg, Pull pants up/down, Fasten/unfasten pants Pants- Performed by helper: Pull pants up/down Non-skid slipper socks- Performed by patient: Don/doff right sock, Don/doff left sock Socks - Performed by patient: Don/doff right sock, Don/doff left sock Shoes - Performed by patient: Don/doff left shoe Shoes - Performed by helper: Don/doff right shoe Assist Level: Touching or steadying assistance (Pt > 75%) Set up : To obtain clothing/put away  Function - Toileting Toileting activity did not occur: Safety/medical concerns Toileting steps completed by patient: Adjust clothing prior to toileting, Performs perineal hygiene, Adjust clothing after toileting Toileting steps completed by helper: Adjust clothing prior  to toileting, Adjust clothing after toileting [place]: Grab  bar or rail Assist level: Touching or steadying assistance (Pt.75%)  Function - Air cabin crew transfer activity did not occur: Refused Toilet transfer assistive device: Scientist, product/process development: Stedy Assist level to toilet: Moderate assist (Pt 50 - 74%/lift or lower) Assist level from toilet: Moderate assist (Pt 50 - 74%/lift or lower) Assist level to bedside commode (at bedside): 2 helpers Assist level from bedside commode (at bedside): Moderate assist (Pt 50 - 74%/lift or lower)  Function - Chair/bed transfer Chair/bed transfer method: Stand pivot Chair/bed transfer assist level: Touching or steadying assistance (Pt > 75%) Chair/bed transfer assistive device: Armrests Chair/bed transfer details: Tactile cues for weight shifting, Tactile cues for posture, Tactile cues for placement, Verbal cues for sequencing, Verbal cues for technique, Verbal cues for precautions/safety, Manual facilitation for weight shifting, Manual facilitation for placement, Manual facilitation for weight bearing  Function - Locomotion: Wheelchair Will patient use wheelchair at discharge?: Yes Type: Manual Max wheelchair distance: 150 Assist Level: Supervision or verbal cues Assist Level: Supervision or verbal cues Assist Level: Supervision or verbal cues Function - Locomotion: Ambulation Assistive device: Walker-rolling Max distance: 25 Assist level: Moderate assist (Pt 50 - 74%) Assist level: Moderate assist (Pt 50 - 74%) Walk 50 feet with 2 turns activity did not occur: Safety/medical concerns Assist level: Moderate assist (Pt 50 - 74%) Walk 150 feet activity did not occur: Safety/medical concerns Assist level: 2 helpers Walk 10 feet on uneven surfaces activity did not occur: Safety/medical concerns  Function - Comprehension Comprehension: Auditory Comprehension assist level: Understands complex 90% of the  time/cues 10% of the time  Function - Expression Expression: Verbal Expression assist level: Expresses basic needs/ideas: With extra time/assistive device  Function - Social Interaction Social Interaction assist level: Interacts appropriately with others with medication or extra time (anti-anxiety, antidepressant).  Function - Problem Solving Problem solving assist level: Solves basic 75 - 89% of the time/requires cueing 10 - 24% of the time  Function - Memory Memory assist level: Recognizes or recalls 75 - 89% of the time/requires cueing 10 - 24% of the time Patient normally able to recall (first 3 days only): That he or she is in a hospital, Location of own room, Current season  Medical Problem List and Plan: 1. Functional deficits secondary to Right flaccid hemiplegia,dysphagia, and aphasia secondary to large left parietal intracranial hemorrhage, also severe cog def, spasticity , has decreased sensation in R thumb and R toes  -increased baclofen to 65m tid with some improvement, no sedation  -would benefit from AFO RLE---orthotist completed  Eval, await brace delivery 2. DVT Prophylaxis/Anticoagulation: Pharmaceutical: Lovenox 3. Pain Management: Tylenol prn 4. Mood: LCSW to follow for evaluation and support as cognition/aphasia improves.  5. Neuropsych: This patient is not capable of making decisions on her own behalf. 6. Skin/Wound Care: Routine pressure relief measures.  7. Fluids/Electrolytes/Nutrition: Monitor I/O.  8. Insomnia: improved on trazodone 9. New onset seizures: with breakthrough now controlled on keppra 15032mbid., monitor valproate level ,no further seizures since readmit 10. Hx anxiety , back on prozac 1074mappears under control 11.  Ankle contracture- heel cord, Cont night time PRAFO 12.  Spasticity improved per pt and OT, some question regarding increased sedation per PT, will review dosing, consider wean LOS (Days) 9 A FACE TO FACE EVALUATION WAS  PERFORMED  KIRSTEINS,ANDREW E 06/13/2015, 8:45 AM   .akp

## 2015-06-13 NOTE — Patient Care Conference (Signed)
Inpatient RehabilitationTeam Conference and Plan of Care Update Date: 06/13/2015   Time: 11:40 AM    Patient Name: Ashley [name]      Medical Record Number: [id number]  Date of Birth: 15-Aug-1976 Sex: Female         Room/Bed: 4W16C/4W16C-01 Payor Info: Payor: BLUE CROSS BLUE SHIELD / Plan: BCBS OTHER / Product Type: *No Product type* /    Admitting Diagnosis: ICH Seizure   Admit Date/Time:  06/04/2015  5:04 PM Admission Comments: No comment available   Primary Diagnosis:  Hemiplga fol ntrm intcrbl hemor aff right dominant side Principal Problem: Hemiplga fol ntrm intcrbl hemor aff right dominant side  Patient Active Problem List   Diagnosis Date Noted  . Contracture of muscle ankle and foot 06/11/2015  . Hemiplga fol ntrm intcrbl hemor aff right dominant side 06/06/2015  . Left-sided intracerebral hemorrhage 06/04/2015  . Seizure disorder as sequela of cerebrovascular accident   . Seizure disorder 06/03/2015  . Sepsis 06/03/2015  . UTI (urinary tract infection) 06/03/2015  . Hypotension 06/02/2015  . Right spastic hemiparesis 05/28/2015  . 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 History of anxiety disorder 05/28/2015  . ICH (intracerebral hemorrhage)   . Seizure disorder, nonconvulsive, with status epilepticus   . Cerebral venous thrombosis of cortical vein   . Cytotoxic cerebral edema   . IVH (intraventricular hemorrhage)   . Term pregnancy 05/07/2015  . Spontaneous vaginal delivery 05/07/2015    Expected Discharge Date: Expected Discharge Date: 06/27/15  Team Members Present: Physician leading conference: Dr. Alysia Penna Social Worker Present: Alfonse Alpers, LCSW Nurse Present: Dorien Chihuahua, RN PT Present: Guilford Shi, PT OT Present: Willeen Cass, OT SLP Present: Windell Moulding, SLP PPS Coordinator present : Daiva Nakayama, RN, CRRN     Current Status/Progress Goal Weekly Team Focus  Medical   No further seizures since readmission to  relocation.  No significant  spasticity  Modified independent to supervision level, home discharge  Wean baclofen   Bowel/Bladder   Continent of bowel and bladder. LBM 06/10/15  Pt to remain continent of bowel and bladder  [place]   Swallow/Nutrition/ Hydration   Regular textures, thin liquids; intermittent/supervision for set up  mod I  goal met, d/c goal   ADL's   Mod A for transfers, Min-Mod A for dressing, steadying assist for bathing (sitting/stanidng)  Overall supervision- Min A for BADL,   Attention to right, NMR of R-UE, standing balance, sequencing/memory/awareness, family ed   Mobility   min A stand-pivot transfer, mod A ambulation  min A - SBA  transfers, ambulation, stairs, bed mobility, AFO assessment   Communication   utilizing compensatory word finding strategies with supervision   Mod I, upgraded   continue to address use of word findiing strategies in conversations with increased independence    Safety/Cognition/ Behavioral Observations  Mild deficits for recall of new information and functional problem solving   supervision   continue to address safety awareness and semi-complex functional problem solving, initiate education and implementation of use of compensatory memory strategies    Pain   Tylenol $Remove'650mg'[id number]$  q 4hrs for headache  <4  Assess for effectiveness   Skin   CDI  No skin breakdown  Encourage to turn q 2hrs    Rehab Goals Patient on target to meet rehab goals: Yes *See Care Plan and progress notes for long and short-term goals.  Barriers to Discharge: Severe weakness and aphasia  continue    Possible Resolutions to Barriers:  Continue neuromuscular  reeducation, speech for language and cognition    Discharge Planning/Teaching Needs:  Home with husband and her mom to assist, here daily  Husband, pt's mother, and pt's sisters-in-law will come for family education when appropriate.   Team Discussion:  Pt is doing well and has had no further seizures.   Spasticity is better controlled and is not keeping her up at night.  MD plans to decrease her baclofen to 2 times a day.  OT is working with husband to problem solve how best to have pt shower at home.  Pt has more functional improvement in her hand and OT plans to incorporate more "mom things."  PT has flexor tone in her hip which limits her ambulation and they are trying two different AFOs.  Pt complains of being sleepy and MD wonders if it is baclofen vs. more activity with therapy.  Pt is doing well in speech with correcting her own mispronounced sounds.  She is more safe, less impulsive, but not eating much.  Revisions to Treatment Plan:  None   Continued Need for Acute Rehabilitation Level of Care: The patient requires daily medical management by a physician with specialized training in physical medicine and rehabilitation for the following conditions: Daily direction of a multidisciplinary physical rehabilitation program to ensure safe treatment while eliciting the highest outcome that is of practical value to the patient.: Yes Daily medical management of patient stability for increased activity during participation in an intensive rehabilitation regime.: Yes Daily analysis of laboratory values and/or radiology reports with any subsequent need for medication adjustment of medical intervention for : Neurological problems;Other  [name], [name] 06/13/2015, 12:55 PM

## 2015-06-13 NOTE — Progress Notes (Signed)
Speech Language Pathology Daily Session Note  Patient Details  Name: Ashley [name] MRN: [id number] Date of Birth: 10/12/1976  Today's Date: 06/13/2015 SLP Individual Time: 4098-1191 SLP Individual Time Calculation (min): 59 min  Short Term Goals: Week 2: SLP Short Term Goal 1 (Week 2): Pt will monitor and correct verbal errors during structured and unstructured tasks with mod I.    SLP Short Term Goal 2 (Week 2): Pt will follow abstract, multi-step commands during functional tasks with supervision cues in 75% of observable opportunities.  SLP Short Term Goal 3 (Week 2): Pt will return demonstration of at least 2 safety precautions during functional tasks with supervision cues. SLP Short Term Goal 4 (Week 2): Pt will recognize and correct errors in the moment during semi-complex self care and/or home management tasks with min assist verbal cues.  SLP Short Term Goal 5 (Week 2): Pt will utilize compensatory memory aids to facilitate recall of new information in >75% of observable opportunities with min assist verbal cues.   Skilled Therapeutic Interventions:  Pt was seen for skilled ST targeting cognitive goals.  Upon arrival, pt was seated upright in wheelchair, awake, lethargic, but agreeable to participate in [place].  SLP administered the Cognistat for ongoing diagnostic treatment of cognition.  Pt demonstrated grossly intact for all tasks assessed other than the memory subtest, in which she demonstrated severe impairment.  Suspect lethargy impacting performance on evaluation as pt is noted to compensate for memory and recalls daily information relatively well in a more familiar, functional context.  However, pt does endorse changes in her mentation which are concerning to her.  SLP provided pt with handout of compensatory strategies for memory and emphasized use of written memory aids, organization, routines, and prominent visual reminders to facilitate improved recall of complex information. SLP also  facilitated the session with structured mental manipulation exercises targeting working memory and immediate recall.  Pt was able to complete 3 step mental math calculations when presented verbally for >90% accuracy and with mod I.  Pt was also able to unscramble 3-4 word sentences and repeat lists of 3 to 4 words in reverse order when stimuli was presented verbally for >90% accuracy and with mod I.  Pt was returned to the room at the end of today's therapy session, left in bed with mom at bedside and call bell within reach.  Continue per current plan of care.    Function:  Eating Eating                 Cognition Comprehension Comprehension assist level: Understands complex 90% of the time/cues 10% of the time  Expression   Expression assist level: Expresses basic needs/ideas: With extra time/assistive device  Social Interaction Social Interaction assist level: Interacts appropriately with others with medication or extra time (anti-anxiety, antidepressant).  Problem Solving Problem solving assist level: Solves basic 75 - 89% of the time/requires cueing 10 - 24% of the time  Memory Memory assist level: Recognizes or recalls 75 - 89% of the time/requires cueing 10 - 24% of the time    Pain Pain Assessment Pain Assessment: No/denies pain  Therapy/Group: Individual Therapy  [name] Plaugher, Selinda Orion 06/13/2015, 3:46 PM

## 2015-06-13 NOTE — Progress Notes (Signed)
Recreational Therapy Session Note  Patient Details  Name: Ashley [name] MRN: [id number] Date of Birth: Mar 03, 1976 Today's Date: 06/13/2015  Pain: no c/o Skilled Therapeutic Interventions/Progress Updates: Session focused on activity tolerance & ambulation during co-treat with PT.  Pt ambulated pushing weighted tilt in space w/c simulating bably stroller with mod assist 140'.  Therapy/Group: Co-Treatment   Teagen Mcleary 06/13/2015, 3:39 PM

## 2015-06-13 NOTE — Plan of Care (Signed)
Problem: RH Expression Communication Goal: LTG Patient will express needs/wants via multi-modal(SLP) LTG: Patient will express needs/wants via multi-modal communication (gestures/written, etc) with cues (SLP)  Upgraded  Goal: LTG Patient will verbally express basic/complex needs (SLP) LTG: Patient will verbally express basic/complex needs, wants or ideas with cues (SLP)  Upgraded   Problem: RH Problem Solving Goal: LTG Patient will demonstrate problem solving for (SLP) LTG: Patient will demonstrate problem solving for basic/complex daily situations with cues (SLP)  Upgraded   Problem: RH Awareness Goal: LTG: Patient will demonstrate intellectual/emergent (SLP) LTG: Patient will demonstrate intellectual/emergent/anticipatory awareness with assist during a cognitive/linguistic activity (SLP)  Upgraded

## 2015-06-14 ENCOUNTER — Inpatient Hospital Stay (HOSPITAL_COMMUNITY): Payer: BLUE CROSS/BLUE SHIELD | Admitting: Occupational Therapy

## 2015-06-14 ENCOUNTER — Encounter (HOSPITAL_COMMUNITY): Payer: BLUE CROSS/BLUE SHIELD

## 2015-06-14 ENCOUNTER — Inpatient Hospital Stay (HOSPITAL_COMMUNITY): Payer: BLUE CROSS/BLUE SHIELD

## 2015-06-14 ENCOUNTER — Inpatient Hospital Stay (HOSPITAL_COMMUNITY): Payer: BLUE CROSS/BLUE SHIELD | Admitting: Speech Pathology

## 2015-06-14 ENCOUNTER — Inpatient Hospital Stay (HOSPITAL_COMMUNITY): Payer: BLUE CROSS/BLUE SHIELD | Admitting: Physical Therapy

## 2015-06-14 NOTE — Progress Notes (Signed)
Occupational Therapy Session Note  Patient Details  Name: Ashley [name] MRN: [id number] Date of Birth: 1976-07-12  Today's Date: 06/14/2015 OT Individual Time: 0730-0900 OT Individual Time Calculation (min): 90 min    Short Term Goals: Week 2:  OT Short Term Goal 1 (Week 2): Pt will completed 2 of 3 toileting tasks with min assist OT Short Term Goal 2 (Week 2): Pt will demo ability to maintain supported standing balance with min assist while performing self-care task OT Short Term Goal 3 (Week 2): Pt will demo ability to complete HEP for continued NMR of RUE with supervision OT Short Term Goal 4 (Week 2): Pt will demo ability to don bra with mod vc using adapted dressing technique OT Short Term Goal 5 (Week 2): Pt will demo ability to self-feed using right hand 25% of selected meal  Skilled Therapeutic Interventions/Progress Updates:  ADL-retraining with focus on improved efficency with tub transfers, discharge planning, re-ed on new goals, and improved attention to right.    Pt continues progress with side stepping but requires cues to slow down and attend to right foot position during mobility and seated self-care.    Pt reports need for assist with remembering to advise her husband of need for home mods and permission from landlord.   OT recovered home measurement checklist and provided handwritten reminders to complete checklist and request permission to remove sliding glass door in shower stall.    Pt able to don pants standing supported with only steadying assist during this session but she continues to require assist to pull up underwear and don her bra.   OT advised replacement bra (sports bra) and offered to request change of ADL session time to afternoon d/t pt's report that she typically showers in afternoon, PTA, and she is more alert and attentive then.     Therapy Documentation Precautions:  Precautions Precautions: Fall Precaution Comments: R flexor synergy in LE during gait;  motor apraxia; R hemiplegia; R inattention Restrictions Weight Bearing Restrictions: No  Vital Signs: Therapy Vitals Temp: 97.7 F (36.5 C) Temp Source: Oral Pulse Rate: 71 Resp: 18 BP: (!) 100/59 mmHg Patient Position (if appropriate): Lying Oxygen Therapy SpO2: 98 % O2 Device: Not Delivered   Pain: Pain Assessment Pain Assessment: No/denies pain ADL: ADL ADL Comments: see Functional Assessment Exercises:   Other Treatments:    Function:   Eating Eating   Eating Assist Level: No help, No cues           Grooming Oral Care,Brush Teeth, Clean Dentures Activity:      Assist Level: More than reasonable amount of time      Wash, Rinse, Dry Face Activity   Assist Level: More than reasonable time      Wash, Rinse, Dry Hands Activity   Assist Level: More than reasonable time      Brush, Comb Hair Activity   Assist Level: More than reasonable time    Shave Activity          Apply Makeup Activity                                                             Bathing Bathing position   Position: Shower  Bathing parts Body parts bathed by patient: Right arm;Left arm;Abdomen;Chest;Front perineal area;Buttocks;Right upper leg;Left  upper leg;Right lower leg;Left lower leg Body parts bathed by helper: Back  Bathing assist Assist Level: Touching or steadying assistance(Pt > 75%)       Upper Body Dressing/Undressing Upper body dressing   What is the patient wearing?: Bra;Pull over shirt/dress Bra - Perfomed by patient: Thread/unthread left bra strap;Hook/unhook bra (pull down sports bra) Bra - Perfomed by helper: Thread/unthread right bra strap Pull over shirt/dress - Perfomed by patient: Thread/unthread right sleeve;Thread/unthread left sleeve;Put head through opening Pull over shirt/dress - Perfomed by helper: Pull shirt over trunk        Upper body assist         Lower Body Dressing/Undressing Lower body dressing   What is the patient wearing?:  Pants;Underwear;Non-skid slipper socks Underwear - Performed by patient: Thread/unthread right underwear leg;Thread/unthread left underwear leg;Pull underwear up/down   Pants- Performed by patient: Thread/unthread left pants leg;Thread/unthread right pants leg;Pull pants up/down   Non-skid slipper socks- Performed by patient: Don/doff right sock;Don/doff left sock                    Lower body assist Assist Level: Touching or steadying assistance (Pt > 75%)       Toileting Toileting          Toileting assist      Bed Mobility Roll left and right activity   Assist level: Set up only Roll left and right assistive device: HOB elevated  Sit to lying activity        Lying to sitting activity   Assist level: Set up only Lying to sitting assistive device: HOB elevated  Mobility details      Transfers Sit to stand transfer   Sit to stand assist level: Supervision or verbal cues Sit to stand assistive device: Armrests  Chair/bed transfer   Chair/bed transfer method: Stand pivot Chair/bed transfer assist level: Supervision or verbal cues Chair/bed transfer assistive device: Armrests   Chair/bed transfer details: Verbal cues for safe use of DME/AE  Toilet transfer                Tub/shower transfer   Tub/shower assistive device: Tub transfer bench   Assist level into tub: Touching or steadying assistance (Pt > 75%/lift 1 leg) Assist level out of tub: Touching or steadying assistance (Pt > 75%)   Cognition Comprehension    Expression Expression assist level: Expresses complex 90% of the time/cues < 10% of the time  Social Interaction Social Interaction assist level: Interacts appropriately with others with medication or extra time (anti-anxiety, antidepressant).  Problem Solving Problem solving assist level: Solves basic problems with no assist  Memory Memory assist level: Requires cues to use assistive device    Therapy/Group: Individual  Therapy  Aldean Suddeth 06/14/2015, 8:59 AM

## 2015-06-14 NOTE — Progress Notes (Signed)
Speech Language Pathology Daily Session Note  Patient Details  Name: Ashley [name] MRN: [id number] Date of Birth: [birth year]  Today's Date: 06/14/2015 SLP Individual Time: 62-1535 SLP Individual Time Calculation (min): 60 min  Short Term Goals: Week 2: SLP Short Term Goal 1 (Week 2): Pt will monitor and correct verbal errors during structured and unstructured tasks with mod I.    SLP Short Term Goal 2 (Week 2): Pt will follow abstract, multi-step commands during functional tasks with supervision cues in 75% of observable opportunities.  SLP Short Term Goal 3 (Week 2): Pt will return demonstration of at least 2 safety precautions during functional tasks with supervision cues. SLP Short Term Goal 4 (Week 2): Pt will recognize and correct errors in the moment during semi-complex self care and/or home management tasks with min assist verbal cues.  SLP Short Term Goal 5 (Week 2): Pt will utilize compensatory memory aids to facilitate recall of new information in >75% of observable opportunities with min assist verbal cues.   Skilled Therapeutic Interventions:  Pt was seen for skilled st targeting cognitive goals.  Upon arrival, pt was seated upright in wheelchair, awake, alert, and agreeable to participate in [place].  SLP facilitated the session with a semi-complex scheduling task targeting functional problem solving.  Pt completed the task with supervision for 100% accuracy.  SLP also facilitated the session with a semi-complex deductive reasoning task targeting working memory, which pt was able to complete with min assist verbal cues and increased time.  SLP explained the role of working memory in higher level daily activities and how it differs from long term and short term memory.  Pt verbalized insight into task difficulty.  Pt was returned to room and left in recliner with call bell within reach.  Continue per current plan of care.    Function:  Eating Eating                  Cognition Comprehension Comprehension assist level: Understands complex 90% of the time/cues 10% of the time  Expression   Expression assist level: Expresses complex 90% of the time/cues < 10% of the time  Social Interaction Social Interaction assist level: Interacts appropriately with others with medication or extra time (anti-anxiety, antidepressant).  Problem Solving Problem solving assist level: Solves basic 90% of the time/requires cueing < 10% of the time  Memory Memory assist level: Recognizes or recalls 90% of the time/requires cueing < 10% of the time    Pain Pain Assessment Pain Assessment: No/denies pain  Therapy/Group: Individual Therapy  June Rode, Selinda Orion 06/14/2015, 4:32 PM

## 2015-06-14 NOTE — Progress Notes (Signed)
Social Work Patient ID: Ashley [name], female   DOB: 1976-03-31, 39 y.o.   MRN: [id number]   CSW met with pt and her mother to update her on team conference discussion and then later spoke with pt's husband via telephone.  Pt is pleased with her progress and is accepting of continued rehab through 06-27-15.  Husband wants for her to have access to as much therapy as possible and is pleased with her care.  Pt is excited of the thought of incorporating more "mom" activities into her OT sessions so she can be ready to return home to her children.  Pt felt very supported by neuropsychologist's visit last week and would like to see her again, if possible.  CSW was able to schedule this for pt for today.  CSW also offered support to pt any time and pt really appreciated that.  CSW will continue to check in on pt to offer her an opportunity to talk, if needed.  CSW received phone call from pt's [employer] case manager requesting clinical updates and [name] will send him these.

## 2015-06-14 NOTE — Progress Notes (Signed)
Occupational Therapy Session Note  Patient Details  Name: Ashley [name] MRN: [id number] Date of Birth: 06-17-1976  Today's Date: 06/14/2015 OT Individual Time: 1315-1400 OT Individual Time Calculation (min): 45 min    Short Term Goals: Week 2:  OT Short Term Goal 1 (Week 2): Pt will completed 2 of 3 toileting tasks with min assist OT Short Term Goal 2 (Week 2): Pt will demo ability to maintain supported standing balance with min assist while performing self-care task OT Short Term Goal 3 (Week 2): Pt will demo ability to complete HEP for continued NMR of RUE with supervision OT Short Term Goal 4 (Week 2): Pt will demo ability to don bra with mod vc using adapted dressing technique OT Short Term Goal 5 (Week 2): Pt will demo ability to self-feed using right hand 25% of selected meal  Skilled Therapeutic Interventions/Progress Updates:    1:1 NMR with focus on utilization of right UE with normal patterns of movement in all planes. Addressed active ROM with control with UE Ranger; working proximal and distally from core. Pt able to perform active movement in transverse plane with extra time for control and verbal cues to maintain a static trunk. Transitioned to holding Dynadisc with bilateral hands and worked on symmetrical movements including shoulder flexion and extension, elbow flexion and extension and internal and external rotation with tactile cues for proper position and mm activation of right UE. Pt transitioned to performing 9 hole peg test - non-standardized with attention to trunk stability and normal movement of right UE.  Pt able to successful complete 2x. Then transitioned to sit to stand task. Focused on sit to stands with UE support to work on maintaining a forward weight shift and even weight bearing through bilateral LEs. Once standing pt stacked one cup then descended with control back to the mat and then perform tasks again.  Performed sit to stand task 16 times. Pt able to complete  stand step transfer with min A with extra time and max cues.     Mother present to observe. Therapy Documentation Precautions:  Precautions Precautions: Fall Precaution Comments: R flexor synergy in LE during gait; motor apraxia; R hemiplegia; R inattention Restrictions Weight Bearing Restrictions: No Pain:  no c/o pain in session     Therapy/Group: Individual Therapy  Willeen Cass [facility] 06/14/2015, 3:31 PM

## 2015-06-14 NOTE — Progress Notes (Signed)
Physical Therapy Session Note  Patient Details  Name: Ashley [name] MRN: [id number] Date of Birth: Oct 13, 1976  Today's Date: 06/14/2015 PT Individual Time: 1000-1100 PT Individual Time Calculation (min): 60 min   Short Term Goals: Week 1:  PT Short Term Goal 1 (Week 1): Pt will transfer w/c to/from bed req min A via stand-step transfer to pt's L. PT Short Term Goal 1 - Progress (Week 1): Met PT Short Term Goal 2 (Week 1): Pt will demonstrate rolling L in flat bed req min A without rails PT Short Term Goal 2 - Progress (Week 1): Met PT Short Term Goal 3 (Week 1): Pt will ambulate >= 50' with Eva walker req 1-2 person assist PT Short Term Goal 3 - Progress (Week 1): Met PT Short Term Goal 4 (Week 1): Pt will demonstrate appropriate sequencing with min cues for sit to stand req min A PT Short Term Goal 4 - Progress (Week 1): Met PT Short Term Goal 5 (Week 1): Pt will initiate stair training during PT session.  PT Short Term Goal 5 - Progress (Week 1): Met  Skilled Therapeutic Interventions/Progress Updates:    W/C Management: see below for details. On first rep, pt practices forced use of R UE (propelling w/c with B UEs), including turns, demonstrating self correction when she initiates L UE to move R UE - stopping herself and moving R UE with R muscles. At end of treatment, PT instructs pt in forced use of R LE (propelling w/c with B LEs) - focus on R knee extension with "stepping" of R leg and pt demonstrates ability to overcome knee flexion tone for the first time during this activity with increased concentration. Gait Training: PT places R posterior leaf spring AFO on pt's R foot and instructs pt in ambulation with RW - see below - focus of this activity is appropriate step length bilaterally with PT facilitating R hip rotation to the L during R swing phase due to R hip held in generally retracted position, as well as PT preventing R knee hyperextension in stance. PT instructs pt in stair  training - see below for details - alternating step-to pattern while wearing AFO. Pt is continuing to progress in all aspects of functional mobility, R side motor control, and safety awareness. Alternating and divided attention continues to be impaired. Continue per PT POC.   Therapy Documentation Precautions:  Precautions Precautions: Fall Precaution Comments: R flexor synergy in LE during gait; motor apraxia; R hemiplegia; R inattention Restrictions Weight Bearing Restrictions: No Pain: Pain Assessment Pain Assessment: No/denies pain   Function:  Toileting Toileting             Bed Mobility Roll left and right activity        Sit to lying activity        Lying to sitting activity        Mobility details     Transfers Sit to stand transfer   Sit to stand assist level: Touching or steadying assistance (Pt > 75%/lift 1 leg) Sit to stand assistive device: Armrests;Walker  Chair/bed transfer   Chair/bed transfer method: Ambulatory Chair/bed transfer assist level: Touching or steadying assistance (Pt > 75%) Chair/bed transfer assistive device: Walker;Armrests   Chair/bed transfer details: Manual facilitation for weight shifting;Verbal cues for technique;Manual facilitation for placement;Verbal cues for safe use of DME/AE;Verbal cues for sequencing   Clinical research associate transfer  Locomotion Ambulation   Assistive device: Walker-rolling Max distance: 170 Assist level: Moderate assist (Pt 50 - 74%) (mostly min A with 1 LOB req mod A to correct)  Walk 10 feet activity   Assist level: Touching or steadying assistance (Pt > 75%)  Walk 50 feet with 2 turns activity   Assist level: Touching or steadying assistance (Pt > 75%)  Walk 150 feet activity   Assist level: Moderate assist (Pt 50 - 74%)  Walk 10 feet on uneven surfaces activity      Stairs   Stairs assistive device: 2 hand rails Max number of stairs: 12 Stairs assist level:  Touching or steadying assistance (Pt > 75%)  Walk up/down 1 step activity     Walk up/down 1 step (curb) assist level: Touching or steadying assistance (Pt > 75%)  Walk up/down 4 steps activity   Walk up/down 4 steps assist level: Touching or steadying assistance (Pt > 75%)  Walk up/down 12 steps activity   Walk up/down 12 steps assist level: Touching or steadying assistance (Pt > 75%)  Pick up small objects from floor      Wheelchair   Type: Manual (with B UEs) Max wheelchair distance: 200 Assist Level: Supervision or verbal cues  Wheel 50 feet with 2 turns activity   Assist Level: Supervision or verbal cues  Wheel 150 feet activity   Assist Level: Supervision or verbal cues   Cognition Comprehension Comprehension assist level: Understands complex 90% of the time/cues 10% of the time  Expression Expression assist level: Expresses complex 90% of the time/cues < 10% of the time  Social Interaction Social Interaction assist level: Interacts appropriately with others with medication or extra time (anti-anxiety, antidepressant).  Problem Solving Problem solving assist level: Solves basic problems with no assist  Memory Memory assist level: Requires cues to use assistive device    Therapy/Group: Individual Therapy  Isbella Arline M 06/14/2015, 12:19 PM

## 2015-06-14 NOTE — Progress Notes (Signed)
Subjective/Complaints: "you got your hair cut" No pain, no increased spasms ROS denies CP, SOB, Abd pain, , bowel and bladder ok   , no tingling, not aware of numbness  Objective: Vital Signs: Blood pressure 100/59, pulse 71, temperature 97.7 F (36.5 C), temperature source Oral, resp. rate 18, weight 63.9 kg (140 lb 14 oz), SpO2 98 %, unknown if currently breastfeeding. No results found. No results found for this or any previous visit (from the past 72 hour(s)).   HEENT: normal Cardio: RRR and no murmur Resp: CTA B/L and unlabored GI: BS positive and NT, ND Extremity:  BS positive and NT, ND Skin:   Central line site healed over, bruising Left side neck Neuro: Alert/Oriented, Abnormal Sensory reduced sensation Right side in LE only , Left side 4-5/5, Abnormal Motor 3-/5 RUE grip, 3- bi and tri ,3- at delt, 2- hip/knee ext RLE and Expressive Aphasic, [place] 1 + at biceps femoris, 3 at ankle PF with tight heel cord,   Musc/Skel:  Other no pain with UE or LE ROM, mildly inverted at ankle, no ankle swelling Gen NAD   Assessment/Plan: 1. Functional deficits secondary to Right spastic hemiplegia,dysphagia, and aphasia secondary to large left parietal intracranial hemorrhage   which require 3+ hours per day of interdisciplinary therapy in a comprehensive inpatient rehab setting. Physiatrist is providing close team supervision and 24 hour management of active medical problems listed below. Physiatrist and rehab team continue to assess barriers to discharge/monitor patient progress toward functional and medical goals.  FIM: Function - Bathing Position: Shower Body parts bathed by patient: Right arm, Chest, Abdomen, Front perineal area, Left arm, Buttocks, Right upper leg, Left upper leg, Left lower leg Body parts bathed by helper: Back, Right lower leg Assist Level: Touching or steadying assistance(Pt > 75%) Set up : To adjust water temperature  Function- Upper Body  Dressing/Undressing What is the patient wearing?: Bra, Pull over shirt/dress Bra - Perfomed by patient: Thread/unthread left bra strap, Hook/unhook bra (pull down sports bra), Thread/unthread right bra strap (put bra over head) Bra - Perfomed by helper: Thread/unthread right bra strap Pull over shirt/dress - Perfomed by patient: Thread/unthread right sleeve, Thread/unthread left sleeve, Put head through opening, Pull shirt over trunk Pull over shirt/dress - Perfomed by helper: Thread/unthread right sleeve Assist Level: Set up Set up : To obtain clothing/put away Function - Lower Body Dressing/Undressing What is the patient wearing?: Pants, Underwear, Socks, Shoes Position: Wheelchair/chair at sink (sit to stand) Underwear - Performed by patient: Thread/unthread right underwear leg, Thread/unthread left underwear leg, Pull underwear up/down Underwear - Performed by helper: Pull underwear up/down Pants- Performed by patient: Thread/unthread right pants leg, Thread/unthread left pants leg, Pull pants up/down, Fasten/unfasten pants Pants- Performed by helper: Pull pants up/down Non-skid slipper socks- Performed by patient: Don/doff right sock, Don/doff left sock Socks - Performed by patient: Don/doff right sock, Don/doff left sock Shoes - Performed by patient: Don/doff left shoe (fasten left n/a) Shoes - Performed by helper: Don/doff right shoe, Fasten right Assist Level: Touching or steadying assistance (Pt > 75%) Set up : To obtain clothing/put away  Function - Toileting Toileting activity did not occur: Safety/medical concerns Toileting steps completed by patient: Adjust clothing prior to toileting, Performs perineal hygiene Toileting steps completed by helper: Adjust clothing after toileting Toileting Assistive Devices: Grab bar or rail Assist level: Touching or steadying assistance (Pt.75%)  Function - Air cabin crew transfer activity did not occur: Refused Toilet transfer  assistive device: Grab bar  Mechanical lift: Stedy Assist level to toilet: Touching or steadying assistance (Pt > 75%) Assist level from toilet: Touching or steadying assistance (Pt > 75%) Assist level to bedside commode (at bedside): 2 helpers Assist level from bedside commode (at bedside): Moderate assist (Pt 50 - 74%/lift or lower)  Function - Chair/bed transfer Chair/bed transfer method: Stand pivot Chair/bed transfer assist level: Supervision or verbal cues Chair/bed transfer assistive device: Armrests Chair/bed transfer details: Verbal cues for safe use of DME/AE  Function - Locomotion: Wheelchair Will patient use wheelchair at discharge?: Yes Type: Manual Max wheelchair distance: 55 (B UEs) Assist Level: Touching or steadying assistance (Pt > 75%) Assist Level: Touching or steadying assistance (Pt > 75%) Assist Level: Supervision or verbal cues Function - Locomotion: Ambulation Assistive device: Other (comment) (pushing therapist sitting in TIS w/c to simulate stroller) Max distance: 140 Assist level: Moderate assist (Pt 50 - 74%) Assist level: Moderate assist (Pt 50 - 74%) Walk 50 feet with 2 turns activity did not occur: Safety/medical concerns Assist level: Moderate assist (Pt 50 - 74%) Walk 150 feet activity did not occur: Safety/medical concerns Assist level: 2 helpers Walk 10 feet on uneven surfaces activity did not occur: Safety/medical concerns  Function - Comprehension Comprehension: Auditory Comprehension assist level: Understands complex 90% of the time/cues 10% of the time  Function - Expression Expression: Verbal Expression assist level: Expresses complex 90% of the time/cues < 10% of the time  Function - Social Interaction Social Interaction assist level: Interacts appropriately with others with medication or extra time (anti-anxiety, antidepressant).  Function - Problem Solving Problem solving assist level: Solves basic problems with no  assist  Function - Memory Memory assist level: Requires cues to use assistive device Patient normally able to recall (first 3 days only): That he or she is in a hospital, Location of own room, Current season  Medical Problem List and Plan: 1. Functional deficits secondary to Right flaccid hemiplegia,dysphagia, and aphasia secondary to large left parietal intracranial hemorrhage, also severe cog def, spasticity , has decreased sensation in R thumb and R toes  -decreased baclofen to 5mg  bid with no increased spasticity, no sedation  -would benefit from AFO RLE---orthotist completed  Eval, await brace delivery 2. DVT Prophylaxis/Anticoagulation: Pharmaceutical: Lovenox 3. Pain Management: Tylenol prn 4. Mood: LCSW to follow for evaluation and support as cognition/aphasia improves.  5. Neuropsych: This patient is not capable of making decisions on her own behalf. 6. Skin/Wound Care: Routine pressure relief measures.  7. Fluids/Electrolytes/Nutrition: Monitor I/O.  8. Insomnia: improved on trazodone 9. New onset seizures: with breakthrough now controlled on keppra 1500mg  bid., monitor valproate level ,no further seizures since readmit 10. Hx anxiety , back on prozac 10mg --appears under control 11.  Ankle contracture- heel cord, Cont night time PRAFO, stretch per PT 12.  Spasticity improved per pt and OT, some question regarding increased sedation per PT, monitor on reduced baclofen LOS (Days) 10 A FACE TO FACE EVALUATION WAS PERFORMED  Ashley [name] 06/14/2015, 8:46 AM   .akp

## 2015-06-15 ENCOUNTER — Inpatient Hospital Stay (HOSPITAL_COMMUNITY): Payer: BLUE CROSS/BLUE SHIELD | Admitting: Occupational Therapy

## 2015-06-15 ENCOUNTER — Inpatient Hospital Stay (HOSPITAL_COMMUNITY): Payer: BLUE CROSS/BLUE SHIELD | Admitting: Physical Therapy

## 2015-06-15 ENCOUNTER — Inpatient Hospital Stay (HOSPITAL_COMMUNITY): Payer: BLUE CROSS/BLUE SHIELD | Admitting: Speech Pathology

## 2015-06-15 DIAGNOSIS — F4321 Adjustment disorder with depressed mood: Secondary | ICD-10-CM | POA: Diagnosis present

## 2015-06-15 MED ORDER — SENNOSIDES-DOCUSATE SODIUM 8.6-50 MG PO TABS
1.0000 | ORAL_TABLET | Freq: Two times a day (BID) | ORAL | Status: DC
  Administered 2015-06-15 – 2015-06-26 (×10): 1 via ORAL
  Filled 2015-06-15 (×20): qty 1

## 2015-06-15 MED ORDER — BACLOFEN 10 MG PO TABS
5.0000 mg | ORAL_TABLET | Freq: Every day | ORAL | Status: DC
  Administered 2015-06-16 – 2015-06-17 (×2): 5 mg via ORAL
  Filled 2015-06-15 (×2): qty 1

## 2015-06-15 NOTE — Progress Notes (Signed)
Physical Therapy Session Note  Patient Details  Name: Ashley [name] MRN: [id number] Date of Birth: Jul 10, 1976  Today's Date: 06/15/2015 PT Individual Time: 1005-1105 PT Individual Time Calculation (min): 60 min   Short Term Goals: Week 2:  PT Short Term Goal 1 (Week 2): Pt will perform bed mobility on flat bed, no rails with supervision and min cues for sequencing PT Short Term Goal 2 (Week 2): Pt will perform transfers bed <> chair with consistent min A and min cues for sequencing PT Short Term Goal 3 (Week 2): Pt will ambulate 150' with RW in controlled and community environment with min A and min cues for sequencing/initiation PT Short Term Goal 4 (Week 2): Pt will negotiate 8 steps with 2 rails and min A PT Short Term Goal 5 (Week 2): Pt will perform w/c mobility outside and in community environment x 200' with supervision  Skilled Therapeutic Interventions/Progress Updates:    Pt received up in w/c, attempting to put posterior leaf spring AFO on R foot, but unable to do so without assistance. Gait Training - see below for details. PT dons R posterior leaf spring AFO for pt prior to first bout of 150' ambulation, but no AFO or neuroprosthetic during second bout of ambulation. PT facilitation at hips to treat pt's R hip sustained retraction (PT facilitates L hip retraction and R hip protraction during R swing phase), verbal cues for R foot placement (pt must correct scissor/hip adduction tendency by abducting leg due to PT facilitation for R hip protraction at swing), and cues for larger step length bilaterally. Neuromuscular Reeducation - PT trials Bioness to stimulate R ankle DF and R thigh component on hamstrings to prevent knee hyperextension in stance for 2 trials (75' each). Pt req skilled intervention from 2 PTs - one facilitating at R leg for improved knee extension in terminal swing, heel strike at initial contact, and hip extension in mid and late stance; other PT facilitates hip rotation  as stated above, assists with balance, and assists with walker management. Pt ended up in w/c with all needs in reach - posterior leaf spring AFO re-donned and pt instructed to remove after 2 hours in order to habituate skin to wearing schedule. Pt demonstrates significant difficulty using Bioness due to R LE flexor tone. Pt's ambulation ability is much improved with posterior leaf spring AFO compared to no AFO. Continue per PT POC.   Therapy Documentation Precautions:  Precautions Precautions: Fall Precaution Comments: R flexor synergy in LE during gait; motor apraxia; R hemiplegia; R inattention Restrictions Weight Bearing Restrictions: No Pain: Pain Assessment Pain Assessment: No/denies pain   Function:  Toileting Toileting             Bed Mobility Roll left and right activity        Sit to lying activity        Lying to sitting activity        Mobility details     Transfers Sit to stand transfer   Sit to stand assist level: Touching or steadying assistance (Pt > 75%/lift 1 leg) Sit to stand assistive device: Walker;Armrests  Chair/bed transfer   Chair/bed transfer method: Ambulatory Chair/bed transfer assist level: Touching or steadying assistance (Pt > 75%) Chair/bed transfer assistive device: Walker;Orthosis (PLS AFO and Bioness neuroprosthetic)   Chair/bed transfer details: Verbal cues for technique;Verbal cues for sequencing;Verbal cues for safe use of DME/AE;Manual facilitation for weight shifting;Manual facilitation for placement;Manual facilitation for weight bearing   Toilet transfer  Haematologist device: Walker-rolling Max distance: 150 x 2 reps Assist level: Moderate assist (Pt 50 - 74%) (min-mod A)  Walk 10 feet activity   Assist level: Touching or steadying assistance (Pt > 75%)  Walk 50 feet with 2 turns activity   Assist level: Moderate assist (Pt 50 - 74%)  Walk 150 feet  activity   Assist level: Moderate assist (Pt 50 - 74%)  Walk 10 feet on uneven surfaces activity      Stairs          Walk up/down 1 step activity        Walk up/down 4 steps activity      Walk up/down 12 steps activity      Pick up small objects from floor      Wheelchair          Wheel 50 feet with 2 turns activity      Wheel 150 feet activity       Cognition Comprehension Comprehension assist level: Follows complex conversation/direction with no assist  Expression Expression assist level: Expresses complex ideas: With extra time/assistive device  Social Interaction Social Interaction assist level: Interacts appropriately with others with medication or extra time (anti-anxiety, antidepressant).  Problem Solving Problem solving assist level: Solves basic problems with no assist  Memory Memory assist level: More than reasonable amount of time    Therapy/Group: Individual Therapy with Letta Moynahan, PT clinical specialist as +2  Lorilee Cafarella M 06/15/2015, 12:39 PM

## 2015-06-15 NOTE — Progress Notes (Signed)
Subjective/Complaints: No problems sleeping overnight. No spasms during the day. Currently on baclofen twice a day. ROS denies CP, SOB, Abd pain, , bowel and bladder ok   , no tingling, not aware of numbness  Objective: Vital Signs: Blood pressure 98/61, pulse 75, temperature 97.8 F (36.6 C), temperature source Oral, resp. rate 18, weight 63.9 kg (140 lb 14 oz), SpO2 98 %, unknown if currently breastfeeding. No results found. No results found for this or any previous visit (from the past 72 hour(s)).   HEENT: normal Cardio: RRR and no murmur Resp: CTA B/L and unlabored GI: BS positive and NT, ND Extremity:  BS positive and NT, ND Skin:   Central line site healed over, bruising Left side neck Neuro: Alert/Oriented, Abnormal Sensory reduced sensation Right side in LE only , Left side 4-5/5, Abnormal Motor 3-/5 RUE grip, 3- bi and tri ,3- at delt, 2- hip/knee ext RLE and Expressive Aphasic, Ashworth 1 + at biceps femoris, 3 at ankle PF with tight heel cord,   Musc/Skel:  Other no pain with UE or LE ROM, mildly inverted at ankle, no ankle swelling Gen NAD   Assessment/Plan: 1. Functional deficits secondary to Right spastic hemiplegia,dysphagia, and aphasia secondary to large left parietal intracranial hemorrhage   which require 3+ hours per day of interdisciplinary therapy in a comprehensive inpatient rehab setting. Physiatrist is providing close team supervision and 24 hour management of active medical problems listed below. Physiatrist and rehab team continue to assess barriers to discharge/monitor patient progress toward functional and medical goals.  FIM: Function - Bathing Position: Shower Body parts bathed by patient: Right arm, Left arm, Abdomen, Chest, Front perineal area, Buttocks, Right upper leg, Left upper leg, Right lower leg, Left lower leg Body parts bathed by helper: Back Assist Level: Touching or steadying assistance(Pt > 75%) Set up : To adjust water  temperature  Function- Upper Body Dressing/Undressing What is the patient wearing?: Bra, Pull over shirt/dress Bra - Perfomed by patient: Thread/unthread left bra strap, Hook/unhook bra (pull down sports bra) Bra - Perfomed by helper: Thread/unthread right bra strap Pull over shirt/dress - Perfomed by patient: Thread/unthread right sleeve, Thread/unthread left sleeve, Put head through opening Pull over shirt/dress - Perfomed by helper: Pull shirt over trunk Assist Level: Set up Set up : To obtain clothing/put away Function - Lower Body Dressing/Undressing What is the patient wearing?: Pants, Underwear, Non-skid slipper socks Position: Wheelchair/chair at sink Underwear - Performed by patient: Thread/unthread right underwear leg, Thread/unthread left underwear leg, Pull underwear up/down Underwear - Performed by helper: Pull underwear up/down Pants- Performed by patient: Thread/unthread left pants leg, Thread/unthread right pants leg, Pull pants up/down Pants- Performed by helper: Pull pants up/down Non-skid slipper socks- Performed by patient: Don/doff right sock, Don/doff left sock Socks - Performed by patient: Don/doff right sock, Don/doff left sock Shoes - Performed by patient: Don/doff left shoe (fasten left n/a) Shoes - Performed by helper: Don/doff right shoe, Fasten right Assist Level: Touching or steadying assistance (Pt > 75%) Set up : To obtain clothing/put away  Function - Toileting Toileting activity did not occur: Safety/medical concerns Toileting steps completed by patient: Adjust clothing prior to toileting, Performs perineal hygiene Toileting steps completed by helper: Adjust clothing after toileting Toileting Assistive Devices: Grab bar or rail Assist level: Touching or steadying assistance (Pt.75%)  Function - Air cabin crew transfer activity did not occur: Refused Toilet transfer assistive device: Grab bar Mechanical lift: Stedy Assist level to toilet:  Touching or steadying  assistance (Pt > 75%) Assist level from toilet: Touching or steadying assistance (Pt > 75%) Assist level to bedside commode (at bedside): 2 helpers Assist level from bedside commode (at bedside): Moderate assist (Pt 50 - 74%/lift or lower)  Function - Chair/bed transfer Chair/bed transfer method: Ambulatory Chair/bed transfer assist level: Touching or steadying assistance (Pt > 75%) Chair/bed transfer assistive device: Walker, Armrests Chair/bed transfer details: Manual facilitation for weight shifting, Verbal cues for technique, Manual facilitation for placement, Verbal cues for safe use of DME/AE, Verbal cues for sequencing  Function - Locomotion: Wheelchair Will patient use wheelchair at discharge?: Yes Type: Manual (with B UEs) Max wheelchair distance: 200 Assist Level: Supervision or verbal cues Assist Level: Supervision or verbal cues Assist Level: Supervision or verbal cues Function - Locomotion: Ambulation Assistive device: Walker-rolling Max distance: 170 Assist level: Moderate assist (Pt 50 - 74%) (mostly min A with 1 LOB req mod A to correct) Assist level: Touching or steadying assistance (Pt > 75%) Walk 50 feet with 2 turns activity did not occur: Safety/medical concerns Assist level: Touching or steadying assistance (Pt > 75%) Walk 150 feet activity did not occur: Safety/medical concerns Assist level: Moderate assist (Pt 50 - 74%) Walk 10 feet on uneven surfaces activity did not occur: Safety/medical concerns  Function - Comprehension Comprehension: Auditory Comprehension assist level: Follows complex conversation/direction with no assist  Function - Expression Expression: Verbal Expression assist level: Expresses complex ideas: With extra time/assistive device  Function - Social Interaction Social Interaction assist level: Interacts appropriately with others with medication or extra time (anti-anxiety, antidepressant).  Function - Problem  Solving Problem solving assist level: Solves basic problems with no assist  Function - Memory Memory assist level: More than reasonable amount of time Patient normally able to recall (first 3 days only): That he or she is in a hospital, Location of own room, Current season  Medical Problem List and Plan: 1. Functional deficits secondary to Right flaccid hemiplegia,dysphagia, and aphasia secondary to large left parietal intracranial hemorrhage, also severe cog def, spasticity , has decreased sensation in R thumb and R toes  -decreased baclofen to 5mg  bid with no increased spasticity, no sedation  -would benefit from AFO RLE---orthotist completed  Eval, await brace delivery 2. DVT Prophylaxis/Anticoagulation: Pharmaceutical: Lovenox 3. Pain Management: Tylenol prn 4. Mood: LCSW to follow for evaluation and support as cognition/aphasia improves.  5. Neuropsych: This patient is not capable of making decisions on her own behalf. 6. Skin/Wound Care: Routine pressure relief measures.  7. Fluids/Electrolytes/Nutrition: Monitor I/O.   633ml fluid, 40--100% meals        8. Insomnia: improved on trazodone, will see if worsens on reduced baclofen 9. New onset seizures: with breakthrough now controlled on keppra 1500mg  bid., monitor valproate level ,no further seizures since readmit 10. Hx anxiety , back on prozac 10mg --appears under control 11.  Ankle contracture- heel cord, Cont night time PRAFO, stretch per PT 12.  Spasticity improved per pt and OT, some question regarding increased sedation per PT, change  Baclofen to Qhs LOS (Days) 11 A FACE TO FACE EVALUATION WAS PERFORMED  Ashley [name] E 06/15/2015, 8:21 AM   .akp

## 2015-06-15 NOTE — Consult Note (Signed)
Neuropsychology Psychosocial - Confidential [place] _____________________________________________________________________________   Ms. Mikhala Kenan was previously seen for a psychodiagnostic evaluation, which revealed the presence of adjustment disorder with depressed mood.  It was recommended that neuropsychology follow-up with her in 1 week for continued support, which was the purpose of today's visit.    Today, Ms. Hise acknowledged continued depression, despite re-initiation of Prozac.  Psychoeducation regarding timeline for symptom relief with use of SSRIs was provided.  Time was spent processing her current depression and the triggers of her low mood, which continue to surround worry about an uncertain future and feeling disconnected from her family.  She mentioned that she feels as though she has been taking her low mood out on her husband and requested a couples' session with the neuropsychologist.  In addition, Ms. Olson said that she has been able to see her physical progress, but continues to feel frustrated by the fact that she is not nearing her ultimate goal of being "back to normal."  We processed this and discussed how working toward intermediate goals might be beneficial because she will be able to feel success, rather than continued failure.  She agreed that would be helpful.  Staff members are encouraged to help her develop shorter term goals (daily or ones that could be reached within a couple of days) and share those goals with her so that she can feel as though she is accomplishing things.    From a cognitive perspective, Ms. Kallen mentioned that her memory continues to suffer.  She said that she is unsure whether her memory difficulties are related to side effects of her muscle relaxer or whether they are related directly to her hemorrhage.  She was observed to have improved ability to stay on-track during conversation compared to her last visit  with the neuropsychologist.  In addition, she did not make any paraphasic errors during the current session.  She was provided with education regarding recovery expectations for cognitive difficulties following a hemorrhage.  Owing to her reported cognitive complaints, it is recommended that she be referred for neuropsychological testing prior to her discharge.  She will be scheduled with Dr. Vikki Ports for that next week.  In addition, I will plan to follow-up with her and her husband next week for a couples' session.    After the session, Ms. Gioffre was approached about being involved in a peer support session with another young woman on the unit who had suffered a stroke and she was agreeable to this.  As such, her social worker will facilitate setting this up.    DIAGNOSES: Hemorrhage Adjustment disorder with depressed mood  Greater than 50% of this visit was spent educating the patient about the possible diagnosis, prognosis, management plan, and in coordination of care.   Marlane Hatcher, PsyD Clinical Neuropsychologist

## 2015-06-15 NOTE — Progress Notes (Signed)
Speech Language Pathology Daily Session Note  Patient Details  Name: Lekeisha Arenas MRN: [id number] Date of Birth: [birth year]  Today's Date: 06/15/2015 SLP Individual Time: 0900-1000; 1500-1530  SLP Individual Time Calculation (min): 60 min; 30 min   Short Term Goals: Week 2: SLP Short Term Goal 1 (Week 2): Pt will monitor and correct verbal errors during structured and unstructured tasks with mod I.    SLP Short Term Goal 2 (Week 2): Pt will follow abstract, multi-step commands during functional tasks with supervision cues in 75% of observable opportunities.  SLP Short Term Goal 3 (Week 2): Pt will return demonstration of at least 2 safety precautions during functional tasks with supervision cues. SLP Short Term Goal 4 (Week 2): Pt will recognize and correct errors in the moment during semi-complex self care and/or home management tasks with min assist verbal cues.  SLP Short Term Goal 5 (Week 2): Pt will utilize compensatory memory aids to facilitate recall of new information in >75% of observable opportunities with min assist verbal cues.   Skilled Therapeutic Interventions:  Session 1: Pt was seen for skilled ST targeting cognitive goals.  Upon arrival, pt was seated upright in wheelchair, awake, alert, and agreeable to participate in ST.  SLP facilitated the session with a semi-complex money management task targeting functional problem solving. Pt balanced a check book with supervision and increased processing time to double check for errors for 100% accuracy.  SLP also facilitated the session with a semi-complex insurance comparison task targeting abstract reasoning and planning.  Pt selected an appropriate insurance plan which met a list of 5 criteria and was able to verbally explain why she selected said plan with mod I.  Pt was also able to select an insurance plan that best met her personally relevant needs with supervision question cues for planning and anticipatory awareness of impairments.   Pt was returned to room at the end of today's session and was left in wheelchair with quick release belt donned and call bell within reach.  Continue per current plan of care.    Session 2:  Pt was seen for skilled ST targeting cognitive goals.  Upon arrival, pt was seated upright in wheelchair, awake, alert, and agreeable to participate in [place].  SLP facilitated the session with a trip planning activity targeting semi-complex functional problem solving.  Pt completed functional, mental math calculations to determine price of trip for 100% accuracy with mod I.  Pt required supervision cues and increased time to locate pertinent information regarding handicap accessibility and infant accommodations from trip location's website.  Pt was able to verbally generate a list of at least 10 items which she would need during trip with supervision.  Pt was left in wheelchair with husband and mother at bedside with all needs within reach.  Continue per current plan of care.    Function:  Eating Eating                 Cognition Comprehension Comprehension assist level: Follows complex conversation/direction with no assist  Expression   Expression assist level: Expresses complex ideas: With extra time/assistive device  Social Interaction Social Interaction assist level: Interacts appropriately with others with medication or extra time (anti-anxiety, antidepressant).  Problem Solving Problem solving assist level: Solves basic 90% of the time/requires cueing < 10% of the time  Memory Memory assist level: Recognizes or recalls 75 - 89% of the time/requires cueing 10 - 24% of the time    Pain Pain Assessment (1) Pain  Assessment: No/denies pain Pain Assessment (2) Pain Assessment: No/denies pain  Therapy/Group: Individual Therapy  Stana Bayon, Selinda Orion 06/15/2015, 3:54 PM

## 2015-06-15 NOTE — Consult Note (Signed)
[facility]   [name] is a 39 year old woman, who was seen for a neurocognitive status examination in order to evaluate her emotional state and mental status in the setting of cerebral hemorrhage.  According to her medical record, she was admitted on 05/21/15 with right facial droop with aphasia, right-sided weakness, and seizures.  She was found to have acute large left fronto-parietal intraparenchymal hemorrhage/ subarachnoid hemorrhage and was started on Keppra for treatment.  She developed vasogenic edema and was treated with hypertonic saline and follow up CCT on 05/25/15 showed stability.  She was initially transferred to inpatient rehabilitation on 05/28/15, but was moved to a step down unit on 06/02/15 due to development of seizures and she was severely hypotensive.  Follow-up CT of her head showed resolving left frontoparietal hemorrhage and Keppra was increased.  She returned to baseline and after being seizure free, was re-admitted to the inpatient rehabilitation unit.    During the clinical interview, Ashley [name] stated that she had only felt "with it" for the past 4 days and during that time, she had been trying to adjust to illness.  She expressed some depressed mood, characterized by recent tearfulness, which she said is triggered by feeling "detached" from her family and the lives of her children.  She lamented that today was their first school day for the year and she was missing it.  Additionally, she described the difficulties she is experiencing with being away from her newborn child.  We processed her emotions surrounding her feelings of detachment and explored the role that feeling that life is going on without her is playing.  She said that she has a history of depressed mood and was treated with Prozac in the past.  She requested to be prescribed Prozac again for depression management.  Ashley [name] denied  suicidal ideation.  In addition to depressed mood, she disclosed having worry over her future, particularly regarding the uncertainty of her physical condition going forward.  She purportedly participated in individual psychotherapy as a child and in the past as an adult, but has not been involved in psychotherapy recently.    Ashley [name] was observed to be forgetful, often having trouble recalling what she was getting ready to say or what question had been asked.  She also made multiple paraphasic errors when speaking.    IMPRESSIONS AND RECOMMENDATIONS: Ashley [name] described symptoms that are consistent with an adjustment disorder with depressed mood.  She was provided with validation of her emotions and her experience was normalized.  Given her history of depression and current depressed mood, it is recommended that her physician consider re-initiating Prozac for mood management.  In addition, the neuropsychologist will plan to follow-up with her next week for continued support.    DIAGNOSIS:   Hemorrhage Adjustment disorder with depressed mood  Ashley [name], Psy.D.  Clinical Neuropsychologist

## 2015-06-15 NOTE — Progress Notes (Signed)
Occupational Therapy Session Note  Patient Details  Name: Ashley [name] MRN: [id number] Date of Birth: Jul 17, 1976  Today's Date: 06/15/2015 OT Individual Time: 1300-1400 OT Individual Time Calculation (min): 60 min    Short Term Goals: Week 2:  OT Short Term Goal 1 (Week 2): Pt will completed 2 of 3 toileting tasks with min assist OT Short Term Goal 2 (Week 2): Pt will demo ability to maintain supported standing balance with min assist while performing self-care task OT Short Term Goal 3 (Week 2): Pt will demo ability to complete HEP for continued NMR of RUE with supervision OT Short Term Goal 4 (Week 2): Pt will demo ability to don bra with mod vc using adapted dressing technique OT Short Term Goal 5 (Week 2): Pt will demo ability to self-feed using right hand 25% of selected meal  Skilled Therapeutic Interventions/Progress Updates:    1:1 self care retraining at shower level with focus on stand step transfers, sit to stand with attention to right Le placement and activation with multimodal cues, functional use of of right UE with mod VC for inattention/ neglect, functional problem solving with dressing, standing balance and tolerance, sit to stands without UE support to address maintaining forward weight shift and right LE activation. Pt demonstrating improved sequencing and functional problem solving requiring less cuing.   Began incorporating her son, Eulas Post (24 weeks old) into treatment to encourage and force use of right hand in functional IADL tasks including changing his diaper and clothes. Pt required extra time and A to support baby's head during some tasks. Pt sat in straddled long sitting position on the bed with baby in between LEs. Pt required max cuing throughout tasks caring for child to utilize right hand and to problem solve how right hand could assist. Pt appreciative of these therapeutic activities.   Pt continues to report decr short term memory deficits - often time pt trying  to hold so much information she has difficulty remembering schedules (related to therapy and non)  Therapy Documentation Precautions:  Precautions Precautions: Fall Precaution Comments: R flexor synergy in LE during gait; motor apraxia; R hemiplegia; R inattention Restrictions Weight Bearing Restrictions: No Pain: Pain Assessment Pain Assessment: No/denies pain ADL: ADL ADL Comments: see Functional Assessment  Function:    Grooming Oral Care,Brush Teeth, Clean Dentures Activity:             Wash, Rinse, Dry Face Activity   Assist Level: More than reasonable time      Wash, Rinse, Dry Hands Activity          Brush, Comb Hair Activity   Assist Level: Touching or steadying assistance(Pt > 75%)    Shave Activity          Apply Makeup Activity                                                             Bathing Bathing position   Position: Shower  Bathing parts Body parts bathed by patient: Right arm;Left arm;Chest;Abdomen;Front perineal area;Buttocks;Right upper leg;Left upper leg;Left lower leg Body parts bathed by helper: Right lower leg;Back  Bathing assist Assist Level: Touching or steadying assistance(Pt > 75%)   Set up : To adjust water temperature   Upper Body Dressing/Undressing Upper body dressing   What is  the patient wearing?: Bra;Pull over shirt/dress Bra - Perfomed by patient: Thread/unthread left bra strap;Hook/unhook bra (pull down sports bra);Thread/unthread right bra strap   Pull over shirt/dress - Perfomed by patient: Thread/unthread right sleeve;Thread/unthread left sleeve;Put head through opening;Pull shirt over trunk          Upper body assist Assist Level: Supervision or verbal cues   Set up : To obtain clothing/put away   Lower Body Dressing/Undressing Lower body dressing   What is the patient wearing?: Pants;Underwear;Non-skid slipper socks Underwear - Performed by patient: Thread/unthread right underwear leg;Thread/unthread  left underwear leg;Pull underwear up/down   Pants- Performed by patient: Thread/unthread right pants leg;Thread/unthread left pants leg;Pull pants up/down;Fasten/unfasten pants       Socks - Performed by patient: Don/doff right sock;Don/doff left sock                Lower body assist Assist Level: Touching or steadying assistance (Pt > 75%)   Set up : To obtain clothing/put away     Bed Mobility Roll left and right activity   Assist level: Set up only    Sit to lying activity   Assist level: Touching or steadying assistance (Pt > 75%, lift 1 leg)    Lying to sitting activity        Mobility details      Transfers Sit to stand transfer   Sit to stand assist level: Touching or steadying assistance (Pt > 75%/lift 1 leg) Sit to stand assistive device: Walker;Armrests  Chair/bed transfer   Chair/bed transfer method: Ambulatory Chair/bed transfer assist level: Touching or steadying assistance (Pt > 75%) Chair/bed transfer assistive device: Walker;Orthosis (PLS AFO and Bioness neuroprosthetic)   Chair/bed transfer details: Verbal cues for technique;Verbal cues for sequencing;Verbal cues for safe use of DME/AE;Manual facilitation for weight shifting;Manual facilitation for placement;Manual facilitation for weight bearing                  Tub/shower transfer   Tub/shower assistive device: Tub transfer bench   Assist level into tub: Touching or steadying assistance (Pt > 75%/lift 1 leg) Assist level out of tub: Touching or steadying assistance (Pt > 75%)   Cognition Comprehension Comprehension assist level: Follows complex conversation/direction with no assist  Expression Expression assist level: Expresses complex ideas: With extra time/assistive device  Social Interaction Social Interaction assist level: Interacts appropriately with others with medication or extra time (anti-anxiety, antidepressant).  Problem Solving Problem solving assist level: Solves basic  problems with no assist  Memory Memory assist level: Recognizes or recalls 75 - 89% of the time/requires cueing 10 - 24% of the time    Therapy/Group: Individual Therapy  Willeen Cass [facility] 06/15/2015, 2:21 PM

## 2015-06-16 ENCOUNTER — Inpatient Hospital Stay (HOSPITAL_COMMUNITY): Payer: BLUE CROSS/BLUE SHIELD | Admitting: *Deleted

## 2015-06-16 NOTE — Progress Notes (Signed)
Physical Therapy Session Note  Patient Details  Name: Ashley [name] MRN: [id number] Date of Birth: 1976-02-03  Today's Date: 06/16/2015 PT Individual Time: 1030-1115 PT Individual Time Calculation (min): 45 min     Skilled Therapeutic Interventions/Progress Updates:  Session focused on stair negotiation, with initial alternate stepping to prepare for actual activity, b Rails min to mod A for weight shift and weight acceptance on L LE. Steps 2 x 4 steps with B Rails with min A and increased cues for L UE progression on rail, fading feedback on feet positioning and sequencing to increase independence.  Gait Training as described below,2 x 100 feet with RW-patient continuously kicks L foot into a walker, decreased accuracy of movement. Initiated gait with AFO, during session switched to ace wrap around ankle and foot to increase proprioception and assist with toe clearance-patient reported feeling more comfortable and more in control of the foot with that arrangement. Initiated training with [facility] 2 x 30 feet with cues for sequencing, education on need to lock L Knee prior to lifting off R foot during gait for improved safety and to reduce hyperextension. With increased practice patient presented with increased fluidity of movement and reduced to 2/10 hyperextension episodes. Need for redirection and maintaining focus on gait training.   Therapy Documentation Precautions:  Precautions Precautions: Fall Precaution Comments: R flexor synergy in LE during gait; motor apraxia; R hemiplegia; R inattention Restrictions Weight Bearing Restrictions: No Vital Signs: Therapy Vitals Temp: 97.5 F (36.4 C) Temp Source: Oral Pulse Rate: 74 Resp: 17 BP: 97/63 mmHg Patient Position (if appropriate): Sitting Oxygen Therapy SpO2: 100 % O2 Device: Not Delivered Pain: Pain Assessment Pain Assessment: 0-10 Pain Score: 0-No pain Pain Type: Acute pain Pain Location: Head Pain Descriptors / Indicators:  Aching Pain Onset: Gradual Pain Intervention(s): Medication (See eMAR)    Function:                                              Transfers Sit to stand transfer   Sit to stand assist level: Touching or steadying assistance (Pt > 75%/lift 1 leg) Sit to stand assistive device: Walker;Armrests  Chair/bed transfer   Chair/bed transfer method: Ambulatory Chair/bed transfer assist level: Touching or steadying assistance (Pt > 75%) Chair/bed transfer assistive device: Armrests   Chair/bed transfer details: Tactile cues for placement   Personnel officer device: Walker-rolling;Cane-quad Max distance: 120x2 Assist level: Moderate assist (Pt 50 - 74%)  Walk 10 feet activity   Assist level: Touching or steadying assistance (Pt > 75%)  Walk 50 feet with 2 turns activity   Assist level: Moderate assist (Pt 50 - 74%)  Walk 150 feet activity Walk 150 feet activity did not occur: Safety/medical concerns    Walk 10 feet on uneven surfaces activity      Stairs   Stairs assistive device: 2 hand rails Max number of stairs: 7 Stairs assist level: Touching or steadying assistance (Pt > 75%)  Walk up/down 1 step activity     Walk up/down 1 step (curb) assist level: Touching or steadying assistance (Pt > 75%)  Walk up/down 4 steps activity   Walk up/down 4 steps assist level: Touching or steadying assistance (Pt >  75%)  Walk up/down 12 steps activity      Pick up small objects from floor      Wheelchair          Wheel 50 feet with 2 turns activity      Wheel 150 feet activity                         Therapy/Group: Individual Therapy  Ashley [name] Spanish 06/16/2015, 2:12 PM

## 2015-06-16 NOTE — Progress Notes (Addendum)
Ashley [name] is a 39 y.o. female [birth year] [id number]  Subjective: Slept well. Feeling OK.  Objective: Vital signs in last 24 hours: Temp:  [98.7 F (37.1 C)] 98.7 F (37.1 C) (09/02 1439) Pulse Rate:  [88] 88 (09/02 1439) Resp:  [16] 16 (09/02 1439) BP: (102)/(69) 102/69 mmHg (09/02 1439) SpO2:  [97 %] 97 % (09/02 1439) Weight change:  Last BM Date: 06/15/15  Intake/Output from previous day: 09/02 0701 - 09/03 0700 In: 1080 [P.O.:1080] Out: -  Last cbgs: CBG (last 3)  No results for input(s): GLUCAP in the last 72 hours.   Physical Exam General: No apparent distress.  Husband is in the room HEENT: not dry Lungs: Normal effort. Lungs clear to auscultation, no crackles or wheezes. Cardiovascular: Regular rate and rhythm, no edema Abdomen: S/NT/ND; BS(+) Musculoskeletal:  unchanged Neurological: No new neurological deficits Wounds: n/a   Skin: clear   Mental state: Alert, oriented, cooperative    Lab Results: BMET    Component Value Date/Time   NA 135 06/03/2015 0300   K 3.9 06/03/2015 0300   CL 98* 06/03/2015 0300   CO2 28 06/03/2015 0300   GLUCOSE 94 06/03/2015 0300   BUN 7 06/03/2015 0300   CREATININE 0.65 06/03/2015 0300   CALCIUM 8.6* 06/03/2015 0300   GFRNONAA >60 06/03/2015 0300   GFRAA >60 06/03/2015 0300   CBC    Component Value Date/Time   WBC 6.8 06/03/2015 0300   RBC 4.26 06/03/2015 0300   HGB 12.6 06/03/2015 0300   HCT 38.8 06/03/2015 0300   PLT 403* 06/03/2015 0300   MCV 91.1 06/03/2015 0300   MCH 29.6 06/03/2015 0300   MCHC 32.5 06/03/2015 0300   RDW 13.7 06/03/2015 0300   LYMPHSABS 1.4 06/02/2015 0900   MONOABS 0.6 06/02/2015 0900   EOSABS 0.1 06/02/2015 0900   BASOSABS 0.0 06/02/2015 0900    Studies/Results: No results found.  Medications: I have reviewed the patient's current medications.  Assessment/Plan:  1. Functional deficits secondary to Right flaccid hemiplegia,dysphagia, and aphasia secondary to large left  parietal intracranial hemorrhage, also severe cog def, spasticity , has decreased sensation in R thumb and R toes -decreased baclofen to 5mg  bid with no increased spasticity, no sedation -would benefit from AFO RLE---orthotist completed Eval, await brace delivery 2. DVT Prophylaxis/Anticoagulation: Pharmaceutical: Lovenox 3. Pain Management: Tylenol prn 4. Mood: LCSW to follow for evaluation and support as cognition/aphasia improves.  5. Neuropsych: This patient is not capable of making decisions on her own behalf. 6. Skin/Wound Care: Routine pressure relief measures.  7. Fluids/Electrolytes/Nutrition: Monitor I/O. 618ml fluid, 40--100% meals 8. Insomnia: improved on trazodone, will see if worsens on reduced baclofen 9. New onset seizures: with breakthrough now controlled on keppra 1500mg  bid., monitor valproate level ,no further seizures since readmit 10. Hx anxiety , back on prozac 10mg --appears under control 11. Ankle contracture- heel cord, Cont night time PRAFO, stretch per PT 12. Spasticity improved per pt and OT, some question regarding increased sedation per PT, Baclofen qhs LOS (Days) 11  Cont Rx  Length of stay, days: 12  Walker Kehr , MD 06/16/2015, 11:04 AM

## 2015-06-17 ENCOUNTER — Inpatient Hospital Stay (HOSPITAL_COMMUNITY): Payer: BLUE CROSS/BLUE SHIELD | Admitting: *Deleted

## 2015-06-17 ENCOUNTER — Inpatient Hospital Stay (HOSPITAL_COMMUNITY): Payer: BLUE CROSS/BLUE SHIELD

## 2015-06-17 NOTE — Progress Notes (Signed)
Physical Therapy Session Note  Patient Details  Name: Ashley [name] MRN: [id number] Date of Birth: 1975-11-15  Today's Date: 06/17/2015 PT Individual Time: 1445-1520 PT Individual Time Calculation (min): 35 min   Short Term Goals: Week 2:  PT Short Term Goal 1 (Week 2): Pt will perform bed mobility on flat bed, no rails with supervision and min cues for sequencing PT Short Term Goal 2 (Week 2): Pt will perform transfers bed <> chair with consistent min A and min cues for sequencing PT Short Term Goal 3 (Week 2): Pt will ambulate 150' with RW in controlled and community environment with min A and min cues for sequencing/initiation PT Short Term Goal 4 (Week 2): Pt will negotiate 8 steps with 2 rails and min A PT Short Term Goal 5 (Week 2): Pt will perform w/c mobility outside and in community environment x 200' with supervision  Skilled Therapeutic Interventions/Progress Updates:   Session focused on neuro re-ed to address gait, balance, and forced use and increasing WB on RLE as well as functional use of RUE during functional reaching activity, w/c propulsion with BUE (cues for attention and use of RUE) and LLE, and transfers. Gait training with WBQC at min A level (x120'x 2) except up to mod A needed during turns with cues for attention to R foot placement, upright posture, relaxing RUE (tend to bring into flexed position), attention to obstacles on R, and manual facilitation for weightshifting demonstrating overall improved gait quality when pt looking up and cued to relax. Performed block practice training for sit to stands with 2 and then 4" step under LLE to increase WB to RLE and progressed to reaching task to R to increase weightshift and functional use of RUE to pick up and place horseshoes on and off basketball hoop. Pt able to pick up 2 items from floor with R hand and min A. Pt making excellent progress and very motivated.   Therapy Documentation Precautions:  Precautions Precautions:  Fall Precaution Comments: R flexor synergy in LE during gait; motor apraxia; R hemiplegia; R inattention Restrictions Weight Bearing Restrictions: No Pain:  No complaints.   Therapy/Group: Individual Therapy  Canary Brim Ivory Broad, PT, DPT  06/17/2015, 3:51 PM

## 2015-06-17 NOTE — Progress Notes (Signed)
Physical Therapy Session Note  Patient Details  Name: Ashley [name] MRN: [id number] Date of Birth: [birth year]  Today's Date: 06/17/2015 PT Individual Time: 4270-6237 PT Individual Time Calculation (min): 35 min   Short Term Goals:  Week 2:  PT Short Term Goal 1 (Week 2): Pt will perform bed mobility on flat bed, no rails with supervision and min cues for sequencing PT Short Term Goal 2 (Week 2): Pt will perform transfers bed <> chair with consistent min A and min cues for sequencing PT Short Term Goal 3 (Week 2): Pt will ambulate 150' with RW in controlled and community environment with min A and min cues for sequencing/initiation PT Short Term Goal 4 (Week 2): Pt will negotiate 8 steps with 2 rails and min A PT Short Term Goal 5 (Week 2): Pt will perform w/c mobility outside and in community environment x 200' with supervision  Skilled Therapeutic Interventions/Progress Updates:  Tx focused on functional mobility training, gait with WBCQ, stairs, and NMR via forced use, manual facilitation, and multi-modal cues. PT answered questions about balance between forced use and AFO as well as appropriate assistive device. She was encouraged that we will continue using various devices as she improves for optimal safety and challenge.   Transfer training sit<>stand and stand-pivot with up to mod A for controlling descent, cues for R weight shifting needed.  Pt instructed in NMR for serial sit<>stands with Bobath method to increase R-sided weight bearing with mirror for visual feedback and manual facilitation for weight shifting. Pt demonstrated great improvement in even weight bearing in this one tx. Also performed static standing and lateral weight shifts to target.   Gait training with [facility] and no device for increased challenge short distance with PLS orthosis and mirror for visual feedback. Gait with WBQC marked by decreased L step length, decreased R placement accuracy, and decreased R weight shift.  Pt responds well with multi-modal cues for above impairments and downward gaze, but has difficulty maintaining changes.   Stair training x4 with L rail ascending and side-stepping descending to simulate home setting.   May benefit from more treadmill training.        Therapy Documentation Precautions:  Precautions Precautions: Fall Precaution Comments: R flexor synergy in LE during gait; motor apraxia; R hemiplegia; R inattention Restrictions Weight Bearing Restrictions: No General:   Vital Signs: Therapy Vitals Temp: 97.7 F (36.5 C) Temp Source: Oral Pulse Rate: 76 Resp: 16 BP: 102/64 mmHg Patient Position (if appropriate): Sitting Oxygen Therapy SpO2: 100 % O2 Device: Not Delivered Pain: RN brought pain meds for headache 4/10       Function:  Toileting Toileting             Bed Mobility Roll left and right activity        Sit to lying activity        Lying to sitting activity        Mobility details     Transfers Sit to stand transfer   Sit to stand assist level: Touching or steadying assistance (Pt > 75%/lift 1 leg) Sit to stand assistive device: Cane;Armrests  Chair/bed transfer   Chair/bed transfer method: Ambulatory Chair/bed transfer assist level: Moderate assist (Pt 50 - 74%/lift or lower) Chair/bed transfer assistive device: Orthosis;Cane;Armrests   Chair/bed transfer details: Verbal cues for safe use of DME/AE;Verbal cues for precautions/safety;Verbal cues for sequencing;Verbal cues for technique;Tactile cues for weight shifting;Tactile cues for posture;Tactile cues for initiation   Toilet transfer  Haematologist device: Cane-quad Max distance: 120 Assist level: Moderate assist (Pt 50 - 74%)  Walk 10 feet activity   Assist level: Touching or steadying assistance (Pt > 75%)  Walk 50 feet with 2 turns activity   Assist level: Moderate assist (Pt 50 - 74%)  Walk 150 feet  activity      Walk 10 feet on uneven surfaces activity      Stairs   Stairs assistive device: 1 hand rail;Orthosis Max number of stairs: 4 Stairs assist level: Touching or steadying assistance (Pt > 75%)  Walk up/down 1 step activity        Walk up/down 4 steps activity   Walk up/down 4 steps assist level: Touching or steadying assistance (Pt > 75%)  Walk up/down 12 steps activity      Pick up small objects from floor   Assist level: Touching or steadying assistance (Pt > 75%)  Wheelchair     Max wheelchair distance: 120 Assist Level: Supervision or verbal cues  Wheel 50 feet with 2 turns activity   Assist Level: Supervision or verbal cues  Wheel 150 feet activity       Cognition Comprehension Comprehension assist level: Follows complex conversation/direction with no assist  Expression Expression assist level: Expresses complex ideas: With extra time/assistive device  Social Interaction Social Interaction assist level: Interacts appropriately with others with medication or extra time (anti-anxiety, antidepressant).  Problem Solving Problem solving assist level: Solves basic 90% of the time/requires cueing < 10% of the time  Memory Memory assist level: Recognizes or recalls 75 - 89% of the time/requires cueing 10 - 24% of the time    Therapy/Group: Individual Therapy  Kennieth Rad, PT, DPT   06/17/2015, 5:02 PM

## 2015-06-17 NOTE — Progress Notes (Signed)
Ashley [name] is a 39 y.o. female [birth year] [id number]  Subjective: No new complaints. No new problems. Slept well.  Objective: Vital signs in last 24 hours: Temp:  [97.5 F (36.4 C)-98.5 F (36.9 C)] 98.5 F (36.9 C) (09/04 0647) Pulse Rate:  [74] 74 (09/04 0647) Resp:  [17-18] 18 (09/04 0647) BP: (96-97)/(55-63) 96/55 mmHg (09/04 0647) SpO2:  [98 %-100 %] 98 % (09/04 0647) Weight change:  Last BM Date: 06/17/15  Intake/Output from previous day: 09/03 0701 - 09/04 0700 In: 960 [P.O.:960] Out: -  Last cbgs: CBG (last 3)  No results for input(s): GLUCAP in the last 72 hours.   Physical Exam General: No apparent distress   HEENT: not dry Lungs: Normal effort. Lungs clear to auscultation, no crackles or wheezes. Cardiovascular: Regular rate and rhythm, no edema Abdomen: S/NT/ND; BS(+) Musculoskeletal:  unchanged Neurological: No new neurological deficits Wounds: n/a   Skin: clear  Mental state: Alert, oriented, cooperative    Lab Results: BMET    Component Value Date/Time   NA 135 06/03/2015 0300   K 3.9 06/03/2015 0300   CL 98* 06/03/2015 0300   CO2 28 06/03/2015 0300   GLUCOSE 94 06/03/2015 0300   BUN 7 06/03/2015 0300   CREATININE 0.65 06/03/2015 0300   CALCIUM 8.6* 06/03/2015 0300   GFRNONAA >60 06/03/2015 0300   GFRAA >60 06/03/2015 0300   CBC    Component Value Date/Time   WBC 6.8 06/03/2015 0300   RBC 4.26 06/03/2015 0300   HGB 12.6 06/03/2015 0300   HCT 38.8 06/03/2015 0300   PLT 403* 06/03/2015 0300   MCV 91.1 06/03/2015 0300   MCH 29.6 06/03/2015 0300   MCHC 32.5 06/03/2015 0300   RDW 13.7 06/03/2015 0300   LYMPHSABS 1.4 06/02/2015 0900   MONOABS 0.6 06/02/2015 0900   EOSABS 0.1 06/02/2015 0900   BASOSABS 0.0 06/02/2015 0900    Studies/Results: No results found.  Medications: I have reviewed the patient's current medications.  Assessment/Plan:  1. Functional deficits secondary to Right flaccid hemiplegia,dysphagia, and aphasia  secondary to large left parietal intracranial hemorrhage, also severe cog def, spasticity , has decreased sensation in R thumb and R toes -decreased baclofen to 5mg  bid with no increased spasticity, no sedation -would benefit from AFO RLE---orthotist completed Eval, await brace delivery 2. DVT Prophylaxis/Anticoagulation: Pharmaceutical: Lovenox 3. Pain Management: Tylenol prn 4. Mood: LCSW to follow for evaluation and support as cognition/aphasia improves.  5. Neuropsych: This patient is not capable of making decisions on her own behalf. 6. Skin/Wound Care: Routine pressure relief measures.  7. Fluids/Electrolytes/Nutrition: Monitor I/O. 636ml fluid, 40--100% meals 8. Insomnia: improved on trazodone, will see if worsens on reduced baclofen 9. New onset seizures: with breakthrough now controlled on keppra 1500mg  bid., monitor valproate level ,no further seizures since readmit 10. Hx anxiety , back on prozac 10mg --appears under control 11. Ankle contracture- heel cord, Cont night time PRAFO, stretch per PT 12. Spasticity improved per pt and OT, some question regarding increased sedation per PT, Baclofen qhs LOS (Days) 11  Cont current Rx  Length of stay, days: [place] , MD 06/17/2015, 12:54 PM

## 2015-06-18 ENCOUNTER — Inpatient Hospital Stay (HOSPITAL_COMMUNITY): Payer: BLUE CROSS/BLUE SHIELD | Admitting: Physical Therapy

## 2015-06-18 ENCOUNTER — Inpatient Hospital Stay (HOSPITAL_COMMUNITY): Payer: BLUE CROSS/BLUE SHIELD

## 2015-06-18 ENCOUNTER — Inpatient Hospital Stay (HOSPITAL_COMMUNITY): Payer: BLUE CROSS/BLUE SHIELD | Admitting: Speech Pathology

## 2015-06-18 MED ORDER — BACLOFEN 10 MG PO TABS: 5.0000 mg | ORAL_TABLET | Freq: Every evening | ORAL | Status: DC | PRN

## 2015-06-18 NOTE — Progress Notes (Signed)
Occupational Therapy Session Note  Patient Details  Name: Emanuelle Hammerstrom MRN: [id number] Date of Birth: 1976/09/18  Today's Date: 06/18/2015 OT Individual Time: 1300-1400 OT Individual Time Calculation (min): 60 min    Short Term Goals: Week 2:  OT Short Term Goal 1 (Week 2): Pt will completed 2 of 3 toileting tasks with min assist OT Short Term Goal 2 (Week 2): Pt will demo ability to maintain supported standing balance with min assist while performing self-care task OT Short Term Goal 3 (Week 2): Pt will demo ability to complete HEP for continued NMR of RUE with supervision OT Short Term Goal 4 (Week 2): Pt will demo ability to don bra with mod vc using adapted dressing technique OT Short Term Goal 5 (Week 2): Pt will demo ability to self-feed using right hand 25% of selected meal  Skilled Therapeutic Interventions/Progress Updates: ADL-retraining with focus on family ed/training on tub bench transfers, adapted bathing/dressing skills, sequencing,  Memory, safety awareness, and dynamic standing balance.   OT educates pt/spouse on tub bench transfer and observes as spouse assists transfer from doorway to tub bench in tub room. Pt requires min vc from husband for foot placement but completes transfer with only steadying assist for safety.   Husband reports plan to use alternate tub/shower w/o sliding doors and expresses plan to Accommodate/assist pt as needed for her regularly BADL s/p d/c.   Spouse then leaves and pt progresses through bathing with steadying assist while pt stands to wash peri-area and buttocks, min vc for technique of transfer from tub and min vc to use RUE during  BADL session.   Pt continues to require assist to don right shoe and to pull down bra over right scapula.     Therapy Documentation Precautions:  Precautions Precautions: Fall Precaution Comments: R flexor synergy in LE during gait; motor apraxia; R hemiplegia; R inattention Restrictions Weight Bearing  Restrictions: No  Pain: Pain Assessment Pain Assessment: No/denies pain   ADL: ADL ADL Comments: see Functional Assessment  Function:   Grooming Oral Care,Brush Teeth, Clean Dentures Activity:             Wash, Rinse, Dry Face Activity   Assist Level: Supervision or verbal cues      Wash, Rinse, Dry Hands Activity   Assist Level: Supervision or verbal cues      Brush, Comb Hair Activity        Shave Activity          Apply Makeup Activity                                                             Bathing Bathing position   Position: Other (comment) (standard tub, bench, sit<>stand)  Bathing parts Body parts bathed by patient: Right arm;Left arm;Abdomen;Chest;Front perineal area;Buttocks;Right upper leg;Left upper leg;Right lower leg;Left lower leg Body parts bathed by helper: Back  Bathing assist Assist Level: Touching or steadying assistance(Pt > 75%)   Set up : To adjust water temperature   Upper Body Dressing/Undressing Upper body dressing   What is the patient wearing?: Bra;Pull over shirt/dress Bra - Perfomed by patient: Thread/unthread right bra strap;Thread/unthread left bra strap Bra - Perfomed by helper: Hook/unhook bra (pull down sports bra) Pull over shirt/dress - Perfomed by patient: Thread/unthread right sleeve;Put head  through opening;Thread/unthread left sleeve;Pull shirt over trunk          Upper body assist Assist Level: Touching or steadying assistance(Pt > 75%)       Lower Body Dressing/Undressing Lower body dressing   What is the patient wearing?: Underwear;Pants;Socks;Shoes Underwear - Performed by patient: Thread/unthread right underwear leg;Thread/unthread left underwear leg;Pull underwear up/down   Pants- Performed by patient: Thread/unthread right pants leg;Thread/unthread left pants leg;Pull pants up/down;Fasten/unfasten pants       Socks - Performed by patient: Don/doff right sock;Don/doff left sock   Shoes - Performed  by patient: Don/doff left shoe Shoes - Performed by helper: Don/doff right shoe;Fasten right;Fasten left          Lower body assist Assist Level: Touching or steadying assistance (Pt > 75%)       Transfers Sit to stand transfer   Sit to stand assist level: Touching or steadying assistance (Pt > 75%/lift 1 leg) Sit to stand assistive device: Armrests;Cane  Chair/bed transfer   Chair/bed transfer method: Stand pivot;Ambulatory Chair/bed transfer assist level: Touching or steadying assistance (Pt > 75%) Chair/bed transfer assistive device: Cane;Orthosis;Armrests   Chair/bed transfer details: Verbal cues for safe use of DME/AE;Verbal cues for precautions/safety;Verbal cues for sequencing;Verbal cues for technique;Tactile cues for weight shifting;Tactile cues for posture;Tactile cues for initiation;Tactile cues for placement;Tactile cues for weight bearing;Visual cues for safe use of DME/AE;Manual facilitation for weight shifting  Toilet transfer                Tub/shower transfer   Tub/shower assistive device: Tub transfer bench;Grab bars   Assist level into tub: Touching or steadying assistance (Pt > 75%/lift 1 leg) Assist level out of tub: Touching or steadying assistance (Pt > 75%)   Therapy/Group: Individual Therapy  [place] 06/18/2015, 3:04 PM

## 2015-06-18 NOTE — Progress Notes (Signed)
Subjective/Complaints: No spasms overnite    Intake is good   ROS denies CP, SOB, Abd pain, , bowel and bladder ok   , occ arm tingling, Objective: Vital Signs: Blood pressure 97/59, pulse 75, temperature 98.1 F (36.7 C), temperature source Oral, resp. rate 18, weight 63.9 kg (140 lb 14 oz), SpO2 98 %, unknown if currently breastfeeding. No results found. No results found for this or any previous visit (from the past 72 hour(s)).   HEENT: normal Cardio: RRR and no murmur Resp: CTA B/L and unlabored GI: BS positive and NT, ND Extremity:  BS positive and NT, ND Skin:   Central line site healed over, bruising Left side neck Neuro: Alert/Oriented, Abnormal Sensory reduced sensation Right side in LE only , Left side 4-5/5, Abnormal Motor 3-/5 RUE grip, 3- bi and tri ,3- at delt, 2- hip/knee ext RLE and Expressive Aphasic, [place] 2  at biceps femoris, 3 at ankle PF with tight heel cord,  + flexor withdrawal spasticity  , sharp/dull sensation intact RUE  , mildly diminished R toes  Musc/Skel:  Other no pain with UE or LE ROM, mildly inverted at ankle, no ankle swelling Gen NAD   Assessment/Plan: 1. Functional deficits secondary to Right spastic hemiplegia,dysphagia, and aphasia secondary to large left parietal intracranial hemorrhage   which require 3+ hours per day of interdisciplinary therapy in a comprehensive inpatient rehab setting. Physiatrist is providing close team supervision and 24 hour management of active medical problems listed below. Physiatrist and rehab team continue to assess barriers to discharge/monitor patient progress toward functional and medical goals.  FIM: Function - Bathing Position: Shower Body parts bathed by patient: Right arm, Left arm, Chest, Abdomen, Front perineal area, Buttocks, Right upper leg, Left upper leg, Left lower leg Body parts bathed by helper: Right lower leg, Back Assist Level: Touching or steadying assistance(Pt > 75%) Set up : To  adjust water temperature  Function- Upper Body Dressing/Undressing What is the patient wearing?: Bra, Pull over shirt/dress Bra - Perfomed by patient: Thread/unthread left bra strap, Hook/unhook bra (pull down sports bra), Thread/unthread right bra strap Bra - Perfomed by helper: Thread/unthread right bra strap Pull over shirt/dress - Perfomed by patient: Thread/unthread right sleeve, Thread/unthread left sleeve, Put head through opening, Pull shirt over trunk Pull over shirt/dress - Perfomed by helper: Pull shirt over trunk Assist Level: Supervision or verbal cues Set up : To obtain clothing/put away Function - Lower Body Dressing/Undressing What is the patient wearing?: Pants, Underwear, Non-skid slipper socks Position: Wheelchair/chair at sink Underwear - Performed by patient: Thread/unthread right underwear leg, Thread/unthread left underwear leg, Pull underwear up/down Underwear - Performed by helper: Pull underwear up/down Pants- Performed by patient: Thread/unthread right pants leg, Thread/unthread left pants leg, Pull pants up/down, Fasten/unfasten pants Pants- Performed by helper: Pull pants up/down Non-skid slipper socks- Performed by patient: Don/doff right sock, Don/doff left sock Socks - Performed by patient: Don/doff right sock, Don/doff left sock Shoes - Performed by patient: Don/doff left shoe (fasten left n/a) Shoes - Performed by helper: Don/doff right shoe, Fasten right Assist Level: Touching or steadying assistance (Pt > 75%) Set up : To obtain clothing/put away  Function - Toileting Toileting activity did not occur: Safety/medical concerns Toileting steps completed by patient: Adjust clothing prior to toileting, Performs perineal hygiene Toileting steps completed by helper: Adjust clothing prior to toileting, Performs perineal hygiene, Adjust clothing after toileting Toileting Assistive Devices: Grab bar or rail Assist level: Touching or steadying assistance (Pt.75%)  (  stedy lift)  Function - Air cabin crew transfer activity did not occur: Refused Toilet transfer assistive device: Grab bar Mechanical lift: Stedy Assist level to toilet: Touching or steadying assistance (Pt > 75%) Assist level from toilet: Touching or steadying assistance (Pt > 75%) Assist level to bedside commode (at bedside): 2 helpers Assist level from bedside commode (at bedside): Moderate assist (Pt 50 - 74%/lift or lower)  Function - Chair/bed transfer Chair/bed transfer method: Ambulatory Chair/bed transfer assist level: Moderate assist (Pt 50 - 74%/lift or lower) Chair/bed transfer assistive device: Orthosis, Cane, Armrests Chair/bed transfer details: Verbal cues for safe use of DME/AE, Verbal cues for precautions/safety, Verbal cues for sequencing, Verbal cues for technique, Tactile cues for weight shifting, Tactile cues for posture, Tactile cues for initiation  Function - Locomotion: Wheelchair Will patient use wheelchair at discharge?: Yes Type: Manual (with B UEs) Max wheelchair distance: 120 Assist Level: Supervision or verbal cues Assist Level: Supervision or verbal cues Assist Level: Supervision or verbal cues Function - Locomotion: Ambulation Assistive device: Cane-quad Max distance: 120 Assist level: Moderate assist (Pt 50 - 74%) Assist level: Touching or steadying assistance (Pt > 75%) Walk 50 feet with 2 turns activity did not occur: Safety/medical concerns Assist level: Moderate assist (Pt 50 - 74%) Walk 150 feet activity did not occur: Safety/medical concerns Assist level: Moderate assist (Pt 50 - 74%) Walk 10 feet on uneven surfaces activity did not occur: Safety/medical concerns  Function - Comprehension Comprehension: Auditory Comprehension assist level: Follows complex conversation/direction with no assist  Function - Expression Expression: Verbal Expression assist level: Expresses complex ideas: With extra time/assistive device  Function  - Social Interaction Social Interaction assist level: Interacts appropriately with others with medication or extra time (anti-anxiety, antidepressant).  Function - Problem Solving Problem solving assist level: Solves basic 90% of the time/requires cueing < 10% of the time  Function - Memory Memory assist level: Recognizes or recalls 75 - 89% of the time/requires cueing 10 - 24% of the time Patient normally able to recall (first 3 days only): That he or she is in a hospital, Location of own room, Current season  Medical Problem List and Plan: 1. Functional deficits secondary to Right flaccid hemiplegia,dysphagia, and aphasia secondary to large left parietal intracranial hemorrhage, also severe cog def, spasticity , has decreased sensation in R toes  -decrease baclofen to 5mg  qhs prn  with no increased spasticity, no sedation  -would benefit from AFO RLE---orthotist completed  Eval, await brace delivery 2. DVT Prophylaxis/Anticoagulation: Pharmaceutical: Lovenox 3. Pain Management: Tylenol prn 4. Mood: LCSW to follow for evaluation and support as cognition/aphasia improves.  5. Neuropsych: This patient is not capable of making decisions on her own behalf. 6. Skin/Wound Care: Routine pressure relief measures.  7. Fluids/Electrolytes/Nutrition: Monitor I/O.   769ml fluid, 100% meals        8. Insomnia: improved on trazodone, will see if worsens on reduced baclofen 9. New onset seizures: with breakthrough now controlled on keppra 1500mg  bid., monitor valproate level ,no further seizures since readmit 10. Hx anxiety , back on prozac 10mg --appears under control 11.  Ankle contracture- heel cord, Cont night time PRAFO, stretch per PT 12.  Spasticity improved per pt , change  Baclofen to Qhs PRN LOS (Days) 14 A FACE TO FACE EVALUATION WAS PERFORMED  KIRSTEINS,ANDREW E 06/18/2015, 8:11 AM   .akp

## 2015-06-18 NOTE — Progress Notes (Signed)
Physical Therapy Session Note  Patient Details  Name: Ashley [name] MRN: [id number] Date of Birth: July 22, 1976  Today's Date: 06/18/2015 PT Individual Time: 0900-1026 PT Individual Time Calculation (min): 86 min   Short Term Goals: Week 2:  PT Short Term Goal 1 (Week 2): Pt will perform bed mobility on flat bed, no rails with supervision and min cues for sequencing PT Short Term Goal 2 (Week 2): Pt will perform transfers bed <> chair with consistent min A and min cues for sequencing PT Short Term Goal 3 (Week 2): Pt will ambulate 150' with RW in controlled and community environment with min A and min cues for sequencing/initiation PT Short Term Goal 4 (Week 2): Pt will negotiate 8 steps with 2 rails and min A PT Short Term Goal 5 (Week 2): Pt will perform w/c mobility outside and in community environment x 200' with supervision  Skilled Therapeutic Interventions/Progress Updates:   Pt received in w/c; no c/o pain.  Reports beginning to walk with quad cane over weekend.  Pt performed w/c mobility to gym as below and transferred w/c > mat as below.  Reviewed with pt how to transfer furniture <> floor to allow pt "tummy time" with son.  Pt transferred to floor with mod A and mod verbal cues for sequence and transitioned to prone on elbows for NMR with min A.  Following NMR, pt performed transfer back to mat from floor with mod A and mod verbal cues for sequence/safety.  Continued gait and stair training with quad cane; negotiated up/down 12 steps x 2 reps with LUE support on rail and mod A to ascend forwards and descend laterally during first flight and then backwards on last flight for increased control; required assistance for full weight shifts, positioning of RLE and activation of RLE in stance.  Transitioned to gait/treadmill training with Lite Gait; performed gait training/NMR on treadmill x 10 minutes x 49ft at 0.4-0.6 mph with focus on trunk and pelvic control, initiation of RLE advancement at  pelvis with anterior rotation, full step and stride length, and RLE activation in stance to prevent genu recurvatum.  At end of session pt left in room in w/c with all items within reach.  Therapy Documentation Precautions:  Precautions Precautions: Fall Precaution Comments: R flexor synergy in LE during gait; motor apraxia; R hemiplegia; R inattention Restrictions Weight Bearing Restrictions: No Pain: Pain Assessment Pain Assessment: No/denies pain Other Treatments: Treatments Neuromuscular Facilitation: Right;Upper Extremity;Lower Extremity;Forced use;Activity to increase motor control;Activity to increase coordination;Activity to increase timing and sequencing;Activity to increase grading;Activity to increase sustained activation;Activity to increase lateral weight shifting;Activity to increase anterior-posterior weight shifting starting in prone on elbows >> quadruped with focus on weight shift and WB through RUE and RLE while reaching with LUE to perform cup stacking/cognitive activity for attention and memory; required min-mod A for stabilization and positioning of R shoulder.     Function:   Transfers Sit to stand transfer   Sit to stand assist level: Touching or steadying assistance (Pt > 75%/lift 1 leg) Sit to stand assistive device: Armrests;Cane  Chair/bed transfer   Chair/bed transfer method: Stand pivot;Ambulatory Chair/bed transfer assist level: Touching or steadying assistance (Pt > 75%) Chair/bed transfer assistive device: Cane;Orthosis;Armrests   Chair/bed transfer details: Verbal cues for safe use of DME/AE;Verbal cues for precautions/safety;Verbal cues for sequencing;Verbal cues for technique;Tactile cues for weight shifting;Tactile cues for posture;Tactile cues for initiation;Tactile cues for placement;Tactile cues for weight bearing;Visual cues for safe use of DME/AE;Manual facilitation for weight  shifting   Risk analyst device: Cane-quad;Lite gait;Other (comment) (treadmill) Max distance: 430 Assist level: Moderate assist (Pt 50 - 74%)  Walk 10 feet activity   Assist level: Moderate assist (Pt 50 - 74%)  Walk 50 feet with 2 turns activity   Assist level: Moderate assist (Pt 50 - 74%)  Walk 150 feet activity   Assist level: Moderate assist (Pt 50 - 74%)  Walk 10 feet on uneven surfaces activity      Stairs   Stairs assistive device: 1 hand rail;Orthosis Max number of stairs: 24 Stairs assist level: Moderate assist (Pt 50 - 74%)  Walk up/down 1 step activity     Walk up/down 1 step (curb) assist level: Moderate assist (Pt 50 - 74%)  Walk up/down 4 steps activity   Walk up/down 4 steps assist level: Moderate assist (Pt 50 - 74%)  Walk up/down 12 steps activity   Walk up/down 12 steps assist level: Moderate assist (Pt 50 - 74%)  Pick up small objects from floor      Wheelchair   Type: Manual Max wheelchair distance: 150 Assist Level: Supervision or verbal cues  Wheel 50 feet with 2 turns activity   Assist Level: Supervision or verbal cues  Wheel 150 feet activity   Assist Level: Supervision or verbal cues   Cognition Comprehension Comprehension assist level: Follows complex conversation/direction with extra time/assistive device  Expression Expression assist level: Expresses complex 90% of the time/cues < 10% of the time  Social Interaction Social Interaction assist level: Interacts appropriately with others with medication or extra time (anti-anxiety, antidepressant).  Problem Solving Problem solving assist level: Solves basic 90% of the time/requires cueing < 10% of the time  Memory Memory assist level: Recognizes or recalls 75 - 89% of the time/requires cueing 10 - 24% of the time;Recognizes or recalls 90% of the time/requires cueing < 10% of the time    Therapy/Group: Individual Therapy  Raylene Everts [facility] 06/18/2015, 12:43 PM

## 2015-06-18 NOTE — Progress Notes (Signed)
Speech Language Pathology Daily Session Note  Patient Details  Name: Ashley [name] MRN: [id number] Date of Birth: 1976/08/15  Today's Date: 06/18/2015  Session 1 SLP Individual Time: 0600-4599 SLP Individual Time Calculation (min): 31 min Session 2 SLP Individual Time: 7741-4239  SLP Individual Time Calculation (min): 35 min  Short Term Goals: Week 2: SLP Short Term Goal 1 (Week 2): Pt will monitor and correct verbal errors during structured and unstructured tasks with mod I.    SLP Short Term Goal 2 (Week 2): Pt will follow abstract, multi-step commands during functional tasks with supervision cues in 75% of observable opportunities.  SLP Short Term Goal 3 (Week 2): Pt will return demonstration of at least 2 safety precautions during functional tasks with supervision cues. SLP Short Term Goal 4 (Week 2): Pt will recognize and correct errors in the moment during semi-complex self care and/or home management tasks with min assist verbal cues.  SLP Short Term Goal 5 (Week 2): Pt will utilize compensatory memory aids to facilitate recall of new information in >75% of observable opportunities with min assist verbal cues.   Skilled Therapeutic Interventions: Session 1 Skilled treatment session focused on addressing cognitive goals. SLP facilitated session by providing and overview of an home management/executive functioning task.  Patient was Mod I for recall of a familiar recipe with use of her cell phone, required Min verbal cues for attention to her right upper extremity while route finding to the kitchen where she compared items on her list to items present in the kitchen and dictated a grocery list to SLP.  Patient also problem solved use of a new online grocery order system with Supervision level verbal cues to locate needed items and prices.   Continue completion of task during next session.     Session 2 Skilled treatment session focused on addressing cognitive goals during completion of  task from earlier session. Patient able to recall where she left off and calculated total cost of grocery items needed with estimation of tax included Mod I with use of calculator.  Patient then verbally problem solved potential difficulties/obstales of a grocery store outing with Min verbal cues to anticipate needs.  Patient reported feeling excited and anxious about the prospect of practicing these skills prior to discharge.  SLP shared goals for an outing with primary PT who agreed of appropriateness.  Patient was left in her room organizing a to do list of other home management tasks that she would like to complete prior to discharge in hopes of reducing anxiety and stress.  Continue with current plan of care.     Function:  Cognition Comprehension Comprehension assist level: Follows complex conversation/direction with extra time/assistive device  Expression   Expression assist level: Expresses complex 90% of the time/cues < 10% of the time  Social Interaction Social Interaction assist level: Interacts appropriately with others with medication or extra time (anti-anxiety, antidepressant).  Problem Solving Problem solving assist level: Solves basic 90% of the time/requires cueing < 10% of the time  Memory Memory assist level: Recognizes or recalls 75 - 89% of the time/requires cueing 10 - 24% of the time;Recognizes or recalls 90% of the time/requires cueing < 10% of the time    Pain Pain Assessment Pain Assessment: No/denies pain x2  Therapy/Group: Individual Therapy x2  Gunnar Fusi, M.A., CCC-SLP [phone]  [place] 06/18/2015, 12:30 PM

## 2015-06-19 ENCOUNTER — Inpatient Hospital Stay (HOSPITAL_COMMUNITY): Payer: BLUE CROSS/BLUE SHIELD | Admitting: Occupational Therapy

## 2015-06-19 ENCOUNTER — Inpatient Hospital Stay (HOSPITAL_COMMUNITY): Payer: BLUE CROSS/BLUE SHIELD | Admitting: Physical Therapy

## 2015-06-19 ENCOUNTER — Inpatient Hospital Stay (HOSPITAL_COMMUNITY): Payer: BLUE CROSS/BLUE SHIELD | Admitting: Speech Pathology

## 2015-06-19 ENCOUNTER — Inpatient Hospital Stay (HOSPITAL_COMMUNITY): Payer: BLUE CROSS/BLUE SHIELD

## 2015-06-19 ENCOUNTER — Encounter (HOSPITAL_COMMUNITY): Payer: BLUE CROSS/BLUE SHIELD

## 2015-06-19 DIAGNOSIS — R569 Unspecified convulsions: Secondary | ICD-10-CM | POA: Insufficient documentation

## 2015-06-19 NOTE — Progress Notes (Signed)
Subjective/Complaints:   Intake is good   ROS denies CP, SOB, Abd pain, , bowel and bladder ok   , occ arm tingling, Objective: Vital Signs: Blood pressure 102/68, pulse 78, temperature 98.1 F (36.7 C), temperature source Oral, resp. rate 18, weight 63.9 kg (140 lb 14 oz), SpO2 99 %, unknown if currently breastfeeding. No results found. No results found for this or any previous visit (from the past 72 hour(s)).   HEENT: normal Cardio: RRR and no murmur Resp: CTA B/L and unlabored GI: BS positive and NT, ND Extremity:  BS positive and NT, ND Skin:   Central line site healed over, bruising Left side neck Neuro: Alert/Oriented, Abnormal Sensory reduced sensation Right side in LE only , Left side 4-5/5, Abnormal Motor 3-/5 RUE grip, 3- bi and tri ,3- at delt, 2- hip/knee ext RLE and Expressive Aphasic, [place] 2  at biceps femoris, 3 at ankle PF with tight heel cord,  + flexor withdrawal spasticity  , sharp/dull sensation intact RUE  , mildly diminished R toes  Musc/Skel:  Other no pain with UE or LE ROM, mildly inverted at ankle, no ankle swelling Gen NAD   Assessment/Plan: 1. Functional deficits secondary to Right spastic hemiplegia,dysphagia, and aphasia secondary to large left parietal intracranial hemorrhage   which require 3+ hours per day of interdisciplinary therapy in a comprehensive inpatient rehab setting. Physiatrist is providing close team supervision and 24 hour management of active medical problems listed below. Physiatrist and rehab team continue to assess barriers to discharge/monitor patient progress toward functional and medical goals.  Team conf in am FIM: Function - Bathing Position: Other (comment) (standard tub, bench, sit<>stand) Body parts bathed by patient: Right arm, Left arm, Abdomen, Chest, Front perineal area, Buttocks, Right upper leg, Left upper leg, Right lower leg, Left lower leg Body parts bathed by helper: Back Assist Level: Touching or  steadying assistance(Pt > 75%) Set up : To adjust water temperature  Function- Upper Body Dressing/Undressing What is the patient wearing?: Bra, Pull over shirt/dress Bra - Perfomed by patient: Thread/unthread right bra strap, Thread/unthread left bra strap Bra - Perfomed by helper: Hook/unhook bra (pull down sports bra) Pull over shirt/dress - Perfomed by patient: Thread/unthread right sleeve, Put head through opening, Thread/unthread left sleeve, Pull shirt over trunk Pull over shirt/dress - Perfomed by helper: Pull shirt over trunk Assist Level: Touching or steadying assistance(Pt > 75%) Set up : To obtain clothing/put away Function - Lower Body Dressing/Undressing What is the patient wearing?: Underwear, Pants, Socks, Shoes Position: Wheelchair/chair at sink Underwear - Performed by patient: Thread/unthread right underwear leg, Thread/unthread left underwear leg, Pull underwear up/down Underwear - Performed by helper: Pull underwear up/down Pants- Performed by patient: Thread/unthread right pants leg, Thread/unthread left pants leg, Pull pants up/down, Fasten/unfasten pants Pants- Performed by helper: Pull pants up/down Non-skid slipper socks- Performed by patient: Don/doff right sock, Don/doff left sock Socks - Performed by patient: Don/doff right sock, Don/doff left sock Shoes - Performed by patient: Don/doff left shoe Shoes - Performed by helper: Don/doff right shoe, Fasten right, Fasten left Assist Level: Touching or steadying assistance (Pt > 75%) Set up : To obtain clothing/put away  Function - Toileting Toileting activity did not occur: Safety/medical concerns Toileting steps completed by patient: Adjust clothing prior to toileting, Performs perineal hygiene Toileting steps completed by helper: Adjust clothing prior to toileting, Performs perineal hygiene, Adjust clothing after toileting Toileting Assistive Devices: Grab bar or rail Assist level: Touching or steadying  assistance (  Pt.75%) (stedy lift)  Function - Air cabin crew transfer activity did not occur: Refused Toilet transfer assistive device: Grab bar Mechanical lift: Stedy Assist level to toilet: Touching or steadying assistance (Pt > 75%) Assist level from toilet: Touching or steadying assistance (Pt > 75%) Assist level to bedside commode (at bedside): 2 helpers Assist level from bedside commode (at bedside): Moderate assist (Pt 50 - 74%/lift or lower)  Function - Chair/bed transfer Chair/bed transfer method: Stand pivot, Ambulatory Chair/bed transfer assist level: Touching or steadying assistance (Pt > 75%) Chair/bed transfer assistive device: Cane, Orthosis, Armrests Chair/bed transfer details: Verbal cues for safe use of DME/AE, Verbal cues for precautions/safety, Verbal cues for sequencing, Verbal cues for technique, Tactile cues for weight shifting, Tactile cues for posture, Tactile cues for initiation, Tactile cues for placement, Tactile cues for weight bearing, Visual cues for safe use of DME/AE, Manual facilitation for weight shifting  Function - Locomotion: Wheelchair Will patient use wheelchair at discharge?: Yes Type: Manual Max wheelchair distance: 150 Assist Level: Supervision or verbal cues Assist Level: Supervision or verbal cues Assist Level: Supervision or verbal cues Function - Locomotion: Ambulation Assistive device: Cane-quad, Lite gait, Other (comment) (treadmill) Max distance: 430 Assist level: Moderate assist (Pt 50 - 74%) Assist level: Moderate assist (Pt 50 - 74%) Walk 50 feet with 2 turns activity did not occur: Safety/medical concerns Assist level: Moderate assist (Pt 50 - 74%) Walk 150 feet activity did not occur: Safety/medical concerns Assist level: Moderate assist (Pt 50 - 74%) Walk 10 feet on uneven surfaces activity did not occur: Safety/medical concerns  Function - Comprehension Comprehension: Auditory Comprehension assist level: Follows  complex conversation/direction with extra time/assistive device  Function - Expression Expression: Verbal Expression assist level: Expresses complex 90% of the time/cues < 10% of the time  Function - Social Interaction Social Interaction assist level: Interacts appropriately with others with medication or extra time (anti-anxiety, antidepressant).  Function - Problem Solving Problem solving assist level: Solves basic 90% of the time/requires cueing < 10% of the time  Function - Memory Memory assist level: Recognizes or recalls 75 - 89% of the time/requires cueing 10 - 24% of the time, Recognizes or recalls 90% of the time/requires cueing < 10% of the time Patient normally able to recall (first 3 days only): That he or she is in a hospital, Location of own room, Current season  Medical Problem List and Plan: 1. Functional deficits secondary to Right flaccid hemiplegia,dysphagia, and aphasia secondary to large left parietal intracranial hemorrhage, also severe cog def, spasticity , has decreased sensation in R toes  -decrease baclofen to 5mg  qhs prn  with no increased spasticity,  -would benefit from AFO RLE---using AFO to amb on steps 2. DVT Prophylaxis/Anticoagulation: Pharmaceutical: Lovenox 3. Pain Management: Tylenol prn 4. Mood: LCSW to follow for evaluation and support as cognition/aphasia improves.  5. Neuropsych: This patient is not capable of making decisions on her own behalf. 6. Skin/Wound Care: Routine pressure relief measures.  7. Fluids/Electrolytes/Nutrition: Monitor I/O.   Intake has been improving        8. Insomnia: improved on trazodone,  9. New onset seizures: with breakthrough now controlled on keppra 1500mg  bid., monitor valproate level ,no further seizures since readmit 10. Hx anxiety , back on prozac 10mg --appears under control 11.  Ankle contracture- heel cord, Cont night time PRAFO, stretch per PT 12.  Spasticity improved per pt , change  Baclofen to Qhs  PRN LOS (Days) 15 A FACE TO FACE EVALUATION WAS  PERFORMED  Lulie Hurd E 06/19/2015, 8:03 AM   .akp

## 2015-06-19 NOTE — Plan of Care (Signed)
Problem: RH Ambulation Goal: LTG Patient will ambulate in controlled environment (PT) LTG: Patient will ambulate in a controlled environment, # of feet with assistance (PT).  Downgraded 06/19/15 due to continued balance and safety issues Goal: LTG Patient will ambulate in home environment (PT) LTG: Patient will ambulate in home environment, # of feet with assistance (PT).  Downgraded 06/19/15 due to continued balance and safety issues

## 2015-06-19 NOTE — Progress Notes (Signed)
Physical Therapy Weekly Progress Note  Patient Details  Name: Ashley [name] MRN: [id number] Date of Birth: Sep 24, 1976  Beginning of progress report period: June 12, 2015 End of progress report period: June 19, 2015  Today's Date: 06/19/2015 PT Individual Time: 1500-1600 PT Individual Time Calculation (min): 60 min   Patient continues to make good progress and has met 4 of 5 short term goals.  Pt is currently supervision for w/c mobility and for bed mobility on flat bed, min A for bed<> chair transfers, min-mod A for gait and stair negotiation. Pt has been evaluated, fit for and has begun training with RLE AFO.    Patient continues to demonstrate the following deficits: impaired cognition, R hemiparesis with R inattention, impaired coordination, impaired motor control and muscle grading, increased tone, impaired postural control, balance, gait and therefore will continue to benefit from skilled PT intervention to enhance overall performance with balance, postural control, ability to compensate for deficits, functional use of  right upper extremity and right lower extremity, attention, awareness and coordination.  Patient not progressing toward long term goals.  See goal revision.  Plan of care revisions: Upgraded stair goal for full flight to access second level bed/bath and downgraded gait due to continued safety, inattention and balance impairments.  PT Short Term Goals Week 2:  PT Short Term Goal 1 (Week 2): Pt will perform bed mobility on flat bed, no rails with supervision and min cues for sequencing PT Short Term Goal 1 - Progress (Week 2): Met PT Short Term Goal 2 (Week 2): Pt will perform transfers bed <> chair with consistent min A and min cues for sequencing PT Short Term Goal 2 - Progress (Week 2): Met PT Short Term Goal 3 (Week 2): Pt will ambulate 150' with RW in controlled and community environment with min A and min cues for sequencing/initiation PT Short Term Goal 3 -  Progress (Week 2): Met PT Short Term Goal 4 (Week 2): Pt will negotiate 8 steps with 2 rails and min A PT Short Term Goal 4 - Progress (Week 2): Progressing toward goal PT Short Term Goal 5 (Week 2): Pt will perform w/c mobility outside and in community environment x 200' with supervision PT Short Term Goal 5 - Progress (Week 2): Met Week 3:  PT Short Term Goal 1 (Week 3): = LTG of supervision-min A overall for D/C 9/14  Skilled Therapeutic Interventions/Progress Updates:   Pt received in w/c.  No c/o pain.  Performed w/c mobility with focus on increased use/activation and sequencing of RUE for propulsion x 150' with supervision and extra time.  Reviewed transfers w/c <> bed, sit <> supine on elevated flat bed to simulate bed at home, simulated car transfer gait in home environment with quad cane; see below.  Discussed with pt having husband come in rest of week for education/training due to husband being out of town early next week prior to D/C.  Performed NMR with gait, stair training; see below.  Returned to room and pt left in w/c with all items within reach.   Therapy Documentation Precautions:  Precautions Precautions: Fall Precaution Comments: R flexor synergy in LE during gait; motor apraxia; R hemiplegia; R inattention Restrictions Weight Bearing Restrictions: No Vital Signs: Therapy Vitals Temp: 98.1 F (36.7 C) Temp Source: Oral Pulse Rate: 89 Resp: 18 BP: (!) 97/56 mmHg Patient Position (if appropriate): Sitting Oxygen Therapy SpO2: 99 % O2 Device: Not Delivered Pain: Pain Assessment Pain Assessment: No/denies pain Other Treatments: Treatments Neuromuscular  Facilitation: Right;Upper Extremity;Lower Extremity;Forced use;Activity to increase motor control;Activity to increase coordination;Activity to increase timing and sequencing;Activity to increase grading;Activity to increase sustained activation;Activity to increase lateral weight shifting;Activity to increase  anterior-posterior weight shifting during stair negotiation up/down 12 stairs with 2 rails with use of alternating sequence to facilitate full forwards weight shifting and activation of RLE during advancement and stance phase; continued NMR with gait without AD over level ground and then on ramp to facilitate increased DF and hamstring activation on RLE + facilitation of forward weight shift to minimize genu recurvatum.  Required mod A overall.  Function:  Bed Mobility Roll left and right activity   Assist level: Supervision or verbal cues    Sit to lying activity   Assist level: Supervision or verbal cues    Lying to sitting activity   Assist level: Supervision or verbal cues    Mobility details Bed mobility details: Visual cues/gestures for sequencing;Verbal cues for techniques;Verbal cues for sequencing;Verbal cues for precautions/safety   Transfers Sit to stand transfer   Sit to stand assist level: Touching or steadying assistance (Pt > 75%/lift 1 leg) Sit to stand assistive device: Armrests;Cane;Orthosis  Chair/bed transfer   Chair/bed transfer method: Stand pivot;Ambulatory Chair/bed transfer assist level: Touching or steadying assistance (Pt > 75%) Chair/bed transfer assistive device: Cane;Orthosis;Armrests   Chair/bed transfer details: Verbal cues for sequencing;Verbal cues for precautions/safety;Visual cues/gestures for precautions/safety;Manual facilitation for weight shifting   Garment/textile technologist transfer assistive device: Cane;Orthosis Car transfer assist level: Touching or steadying assistance (Pt > 75%, lift 1 leg)   Locomotion Ambulation   Assistive device: Cane-quad Max distance: 150 Assist level: Touching or steadying assistance (Pt > 75%)  Walk 10 feet activity   Assist level: Touching or steadying assistance (Pt > 75%)  Walk 50 feet with 2 turns activity   Assist level: Touching or steadying assistance (Pt > 75%)  Walk 150  feet activity   Assist level: Touching or steadying assistance (Pt > 75%)  Walk 10 feet on uneven surfaces activity      Stairs   Stairs assistive device: 2 hand rails;Orthosis Max number of stairs: 12 Stairs assist level: Moderate assist (Pt 50 - 74%)  Walk up/down 1 step activity     Walk up/down 1 step (curb) assist level: Moderate assist (Pt 50 - 74%)  Walk up/down 4 steps activity   Walk up/down 4 steps assist level: Moderate assist (Pt 50 - 74%)  Walk up/down 12 steps activity   Walk up/down 12 steps assist level: Moderate assist (Pt 50 - 74%)  Pick up small objects from floor      Wheelchair   Type: Manual Max wheelchair distance: 150 Assist Level: Supervision or verbal cues  Wheel 50 feet with 2 turns activity   Assist Level: Supervision or verbal cues  Wheel 150 feet activity   Assist Level: Supervision or verbal cues    Therapy/Group: Individual Therapy  Raylene Everts [facility] 06/19/2015, 4:48 PM

## 2015-06-19 NOTE — Progress Notes (Signed)
Occupational Therapy Weekly Progress Note  Patient Details  Name: Ashley [name] MRN: [id number] Date of Birth: July 31, 1976  Beginning of progress report period: June 12, 2015 End of progress report period: June 19, 2015  Today's Date: 06/19/2015 OT Individual Time: 1100-1200 OT Individual Time Calculation (min): 60 min   Patient has met 4 of 5 short term goals.  Pt making progress on donning her bra however spouse has not recovered a more appropriate bra from home as needed to improve pt's independence.  Patient continues to demonstrate the following deficits: Impaired memory, impaired RUE function, impaired standing balance, impaired mobility and therefore will continue to benefit from skilled OT intervention to enhance overall performance with BADL.  Patient progressing toward long term goals..  Continue plan of care.  OT Short Term Goals Week 1:  OT Short Term Goal 1 (Week 1): Pt will sequence bathing 10 out of 10 parts with min questioning cues OT Short Term Goal 1 - Progress (Week 1): Met OT Short Term Goal 2 (Week 1): Pt will complete toilet transfer with min assist to place right L/UE OT Short Term Goal 2 - Progress (Week 1): Met OT Short Term Goal 3 (Week 1): Pt will complete sit>stand in prep for lower body B & D with max  OT Short Term Goal 3 - Progress (Week 1): Met OT Short Term Goal 4 (Week 1): Pt will use right UE as gross assist during functional task with min instructional cues. OT Short Term Goal 4 - Progress (Week 1): Progressing toward goal OT Short Term Goal 5 - Progress (Week 1): Met Week 2:  OT Short Term Goal 1 (Week 2): Pt will completed 2 of 3 toileting tasks with min assist OT Short Term Goal 1 - Progress (Week 2): Met OT Short Term Goal 2 (Week 2): Pt will demo ability to maintain supported standing balance with min assist while performing self-care task OT Short Term Goal 2 - Progress (Week 2): Met OT Short Term Goal 3 (Week 2): Pt will demo ability to  complete HEP for continued NMR of RUE with supervision OT Short Term Goal 3 - Progress (Week 2): Met OT Short Term Goal 4 (Week 2): Pt will demo ability to don bra with mod vc using adapted dressing technique OT Short Term Goal 4 - Progress (Week 2): Progressing toward goal OT Short Term Goal 5 (Week 2): Pt will demo ability to self-feed using right hand 25% of selected meal OT Short Term Goal 5 - Progress (Week 2): Met Week 3:  OT Short Term Goal 1 (Week 3): STG=LTG d/t short remaining LOS.  Skilled Therapeutic Interventions/Progress Updates:  Therapeutic activity with focus on improved standing balance, functional mobility using quad-tipped cane, and NMR of RUE.   Pt received in w/c, dressed and receptive for continued rehab to address standing balance.   Pt able to ambulate from w/c to stroller (simulated using additional w/c) with min instructional cues for right foot placement and posture and contact guard.  Pt demo'd LOB posteriorly 2 times during back-up and turn to left initially.   Pt pushed stroller approx 100' with OT providing contact guard and min vc to slow down pace and maintain correct distance to stroller.   Pt was then escorted to gift shop to continue ambulation with quad-tipped cane with reinforcement of right UE functional use.   Pt able to reach, bend to mid-thigh level and manipulate objects using both hands with steadying assist while standing.   Session  concluded outdoors with pt education on grip/pinch strengthening using theraputty.   Pt completed 1 of 11 exercises presented using written HEP and medium soft putty (orange).   Pt returned to her room, self-propelling w/c approx 100'.     Therapy Documentation Precautions:  Precautions Precautions: Fall Precaution Comments: R flexor synergy in LE during gait; motor apraxia; R hemiplegia; R inattention Restrictions Weight Bearing Restrictions: No  Pain: Pain Assessment Pain Assessment: No/denies pain   ADL: ADL ADL  Comments: see Functional Assessment  Therapy/Group: Individual Therapy  [place] 06/19/2015, 8:38 PM

## 2015-06-19 NOTE — Progress Notes (Signed)
Speech Language Pathology Weekly Progress and Session Note  Patient Details  Name: Ashley [name] MRN: [id number] Date of Birth: 07/07/1976  Beginning of progress report period: June 12, 2015 End of progress report period: June 19, 2015  Today's Date: 06/19/2015 SLP Individual Time: 0900-1003 SLP Individual Time Calculation (min): 63 min  Short Term Goals: Week 2: SLP Short Term Goal 1 (Week 2): Pt will monitor and correct verbal errors during structured and unstructured tasks with mod I.    SLP Short Term Goal 1 - Progress (Week 2): Met SLP Short Term Goal 2 (Week 2): Pt will follow abstract, multi-step commands during functional tasks with supervision cues in 75% of observable opportunities.  SLP Short Term Goal 2 - Progress (Week 2): Met SLP Short Term Goal 3 (Week 2): Pt will return demonstration of at least 2 safety precautions during functional tasks with supervision cues. SLP Short Term Goal 3 - Progress (Week 2): Met SLP Short Term Goal 4 (Week 2): Pt will recognize and correct errors in the moment during semi-complex self care and/or home management tasks with min assist verbal cues.  SLP Short Term Goal 4 - Progress (Week 2): Met SLP Short Term Goal 5 (Week 2): Pt will utilize compensatory memory aids to facilitate recall of new information in >75% of observable opportunities with min assist verbal cues.  SLP Short Term Goal 5 - Progress (Week 2): Met    New Short Term Goals: Week 3: SLP Short Term Goal 1 (Week 3): Pt will follow abstract, multi-step commands during functional tasks with mod I in 75% of observable opportunities.  SLP Short Term Goal 2 (Week 3): Pt will return demonstration of at least 2 safety precautions during functional tasks with mod I.  SLP Short Term Goal 3 (Week 3): Pt will recognize and correct errors in the moment during semi-complex self care and/or home management tasks with supervision.  SLP Short Term Goal 4 (Week 3): Pt will utilize  compensatory memory aids to facilitate recall of new information in >75% of observable opportunities with supervision.  SLP Short Term Goal 5 (Week 3): Pt will utliize compensatory strategies for word finding to convey abstract, complex needs and/or wants with mod I.   SLP Short Term Goal 6 (Week 3): Pt will plan and execute a problem solving strategy during semi-complex self care and/or home management tasks with mod I.    Weekly Progress Updates:  Pt continues to make functional gains and has met 5 out of 5 short term goals this reporting period.  Pt currently requires min assist-supervision during complex self care and/or home management tasks.  Ashley [name] most limiting deficit is related to decreased working memory and pt is becoming increasingly more independent in Ashley [name] use of compensatory strategies during tasks.  Pt is mod I for monitoring and correcting verbal errors in conversation.  Ashley [name] communication deficits are higher level in nature and noticeable only when pt is attempting to convey abstract or complex information, needs, or wants.   Pt would continue to benefit from skilled ST while inpatient in order to maximize functional independence and reduce burden of care prior to discharge.  Pt and family education is ongoing.  Continue to anticipate that pt will need 24/7 supervision, assistance for medication and financial management, and ST follow up at next level of care.     Intensity: Minumum of 1-2 x/day, 30 to 90 minutes Frequency: 3 to 5 out of 7 days Duration/Length of Stay: 14-21 days  Treatment/Interventions: Cognitive  remediation/compensation;Cueing hierarchy;Dysphagia/aspiration precaution training;Internal/external aids;Environmental controls;Functional tasks;Speech/Language facilitation;Patient/family education;Multimodal communication approach   Daily Session  Skilled Therapeutic Interventions: Pt was seen for skilled ST targeting cognitive goals.  Upon arrival, pt was seated upright  in wheelchair, awake, alert, and agreeable to participate in [place].  SLP facilitated the session with a semi-complex catalog shopping task targeting abstract reasoning, executive function, and numerical reasoning.  Pt selected appropriate gift items from a catalog when given a list of vague criteria and a budget with min assist verbal cues for working memory.  Pt recorded information into an order form with mod I and increased time.  Pt was intermittently frustrated by changes in Ashley [name] handwriting but was able to redirect herself to task with mod I.  Pt was also able to alternate Ashley [name] attention between conversations with the SLP and the abovementioned task with mod I.  At the end of today's session, pt was returned to room and left with all needs within reach.  Pt continues to make excellent progress towards goals.  Goals updated to reflect current progress and plan of care.      Function:   Eating Eating                 Cognition Comprehension Comprehension assist level: Follows complex conversation/direction with extra time/assistive device  Expression   Expression assist level: Expresses complex 90% of the time/cues < 10% of the time  Social Interaction Social Interaction assist level: Interacts appropriately with others with medication or extra time (anti-anxiety, antidepressant).  Problem Solving Problem solving assist level: Solves basic 90% of the time/requires cueing < 10% of the time  Memory Memory assist level: Recognizes or recalls 75 - 89% of the time/requires cueing 10 - 24% of the time   General    Pain Pain Assessment Pain Assessment: No/denies pain  Therapy/Group: Individual Therapy  Allien Melberg, Selinda Orion 06/19/2015, 12:55 PM

## 2015-06-20 ENCOUNTER — Inpatient Hospital Stay (HOSPITAL_COMMUNITY): Payer: BLUE CROSS/BLUE SHIELD | Admitting: *Deleted

## 2015-06-20 ENCOUNTER — Inpatient Hospital Stay (HOSPITAL_COMMUNITY): Payer: BLUE CROSS/BLUE SHIELD | Admitting: Physical Therapy

## 2015-06-20 ENCOUNTER — Inpatient Hospital Stay (HOSPITAL_COMMUNITY): Payer: BLUE CROSS/BLUE SHIELD | Admitting: Speech Pathology

## 2015-06-20 ENCOUNTER — Inpatient Hospital Stay (HOSPITAL_COMMUNITY): Payer: BLUE CROSS/BLUE SHIELD | Admitting: Occupational Therapy

## 2015-06-20 NOTE — Progress Notes (Signed)
Occupational Therapy Session Note  Patient Details  Name: Ashley [name] MRN: [id number] Date of Birth: 1976/07/09  Today's Date: 06/20/2015 OT Individual Time: 0800-0900 OT Individual Time Calculation (min): 60 min    Short Term Goals: Week 3:  OT Short Term Goal 1 (Week 3): STG=LTG d/t short remaining LOS.  Skilled Therapeutic Interventions/Progress Updates:    1:1 self care retraining at shower level. Progressed today to walking in and out of the bathroom without the brace and with QBC with min A and facilitation to keep hip forward with advancing her right LE. In shower pt demonstrated increased automatic right UE use; even able to engage in shaving her legs with her right hand!! Pt able to perform standing balance during peri care with close supervision during pericare. Pt ambulated back to the bed to dress.  Pt required decr amt of time to perform dressing today; again demonstrating increase automatic use of right hand. Pt does need A with higher ROM and with orientation of arm in higher ROM of right UE.   Therapy Documentation Precautions:  Precautions Precautions: Fall Precaution Comments: R flexor synergy in LE during gait; motor apraxia; R hemiplegia; R inattention Restrictions Weight Bearing Restrictions: No Pain: Pain Assessment Pain Assessment: No/denies pain ADL: ADL ADL Comments: see Functional Assessment  See Function Navigator for Current Functional Status.   Therapy/Group: Individual Therapy  Willeen Cass [facility] 06/20/2015, 11:11 AM

## 2015-06-20 NOTE — Progress Notes (Signed)
Speech Language Pathology Daily Session Note  Patient Details  Name: Ashley [name] MRN: [id number] Date of Birth: 09-11-1976  Today's Date: 06/20/2015 SLP Individual Time: Session 1: 0900-0930; [id number] SLP Individual Time Calculation (min): 30 min; 33 min   Short Term Goals: Week 3: SLP Short Term Goal 1 (Week 3): Pt will follow abstract, multi-step commands during functional tasks with mod I in 75% of observable opportunities.  SLP Short Term Goal 2 (Week 3): Pt will return demonstration of at least 2 safety precautions during functional tasks with mod I.  SLP Short Term Goal 3 (Week 3): Pt will recognize and correct errors in the moment during semi-complex self care and/or home management tasks with supervision.  SLP Short Term Goal 4 (Week 3): Pt will utilize compensatory memory aids to facilitate recall of new information in >75% of observable opportunities with supervision.  SLP Short Term Goal 5 (Week 3): Pt will utliize compensatory strategies for word finding to convey abstract, complex needs and/or wants with mod I.   SLP Short Term Goal 6 (Week 3): Pt will plan and execute a problem solving strategy during semi-complex self care and/or home management tasks with mod I.    Skilled Therapeutic Interventions:  Session 1: Pt was seen for skilled ST targeting cognitive goals. Upon arrival, pt was seated upright in wheelchair, awake, alert, and agreeable to participate in [place].  SLP facilitated the session with continued practice of functional problem solving skills during a catalog shopping task.  Pt selected appropriate items for the remaining 2 individuals during task with mod I.  SLP increased task complexity by providing task distractions and interruptions.  Pt was able to alternate/divide her attention between therapist and task for its duration (~30 minutes) with mod I.  She required intermittent supervision cues and extra time for working memory when completing functional math  calculations.  Continue per current plan of care.    Session 2: Pt was seen for skilled ST targeting cognitive goals.  Upon arrival, pt was seated upright in wheelchair, awake, alert, and agreeable to participate in ST.  SLP facilitated the session with a semi-complex categorical naming task targeting use of compensatory word finding strategies and functional problem solving.  Pt was mod I for word finding during the abovementioned task but required extra time for mental flexibility to generate specific category members.  SLP will plan to address higher level cognition and multi-tasking during a structured cooking task at next appointment.  Pt provided SLP with a written list of ingredients needed to make a familiar recipe which was discussed in a previous treatment session.  Pt was able to identify potential challenges that she may face when cooking and generate solutions to them with supervision question cues.  Pt was left upright in wheelchair with call bell within reach.  Continue per current plan of care.    Function:  Eating Eating                 Cognition Comprehension Comprehension assist level: Follows complex conversation/direction with extra time/assistive device  Expression   Expression assist level: Expresses complex 90% of the time/cues < 10% of the time  Social Interaction Social Interaction assist level: Interacts appropriately with others with medication or extra time (anti-anxiety, antidepressant).  Problem Solving Problem solving assist level: Solves basic 90% of the time/requires cueing < 10% of the time  Memory Memory assist level: Recognizes or recalls 75 - 89% of the time/requires cueing 10 - 24% of the time  Pain Pain Assessment Pain Assessment: No/denies pain  Therapy/Group: Individual Therapy  Ashley [name], Ashley [name] 06/20/2015, 10:57 AM

## 2015-06-20 NOTE — Plan of Care (Signed)
Problem: RH Ambulation Goal: LTG Patient will ambulate in controlled environment (PT) LTG: Patient will ambulate in a controlled environment, # of feet with assistance (PT).  Goal upgraded due to pt progress.  Goal: LTG Patient will ambulate in home environment (PT) LTG: Patient will ambulate in home environment, # of feet with assistance (PT).  Goal upgraded due to pt progress.

## 2015-06-20 NOTE — Progress Notes (Signed)
Orthopedic Tech Progress Note Patient Details:  Ashley [name] Apr 21, 1976 [id number]  Patient ID: Ashley [name], female   DOB: [birth year], 39 y.o.   MRN: [id number] Called in advanced brace order; spoke with Raul Del, Elija Mccamish 06/20/2015, 9:41 AM

## 2015-06-20 NOTE — Progress Notes (Signed)
Occupational Therapy Session Note  Patient Details  Name: Ashley [name] MRN: [id number] Date of Birth: Feb 18, 1976  Today's Date: 06/20/2015  Late entry for 06/19/2015 OT Individual Time:  - 13:05-13:50 (18min)      Skilled Therapeutic Interventions/Progress Updates:    1:1 NMR: Pt transported via w/c to the gym. Pt requested to initially work on handwriting with right UE.  Discussed optimal sitting positioning for success and legibility.  Pt sat on elevated surface with table and was able to successful print and sign her name as well as write a sentence. Then transitioned to short distance functional ambulation with QBC from mat to mirror. Engaged in writing and playing a game on the mirror promoting weight shifts on to the right LE, functional reach with tactile cues for facilitation of normal patterns of movement of right UE. Pt able to demonstrate functional ambulation back to her room with QBC with min A and mod A for balance recovery when right LE was delayed in following through in stepping.   Therapy Documentation Precautions:  Precautions Precautions: Fall Precaution Comments: R flexor synergy in LE during gait; motor apraxia; R hemiplegia; R inattention Restrictions Weight Bearing Restrictions: No Pain:  no c/o pain  ADL: ADL ADL Comments: see Functional Assessment    Therapy/Group: Individual Therapy  Willeen Cass [facility] 06/20/2015, 7:22 AM

## 2015-06-20 NOTE — Progress Notes (Signed)
Social Work Patient ID: Ashley [name], female   DOB: 08/30/1976, 38 y.o.   MRN: [id number]   CSW met with pt and her husband to update them on team conference discussion.  We also discussed pt's outpt therapy and CSW scheduled first visits.  Pt will need some DME at d/c and CSW will assist with this, as well.  They were relieved to know they would have help with this.  CSW also discussed temporary handicap placard and pt was interested in this.  CSW update insurance case manager with team conference note and request for continued stay with d/c on 06-27-15.  Pt had an opportunity to meet with another young stroke pt and found this helpful and would like to meet with her again prior to that pt's d/c.  CSW will coordinate this with both patients.  CSW discussed the importance of pt having someone with her at home for supervision and she expressed understanding, but she made it clear that her mother would not be able to provide any physical care.  [name] plans to assist pt if she needs any physical care.  CSW later met with pt in her room and we had a chance for her to just talk with CSW with no other interruptions.  CSW will continue to be available for these type visits and will also assist pt as needed with the d/c plan.  

## 2015-06-20 NOTE — Plan of Care (Signed)
Problem: RH Balance Goal: LTG Patient will maintain dynamic standing with ADLs (OT) LTG: Patient will maintain dynamic standing balance with assist during activities of daily living (OT)  Upgraded JLS  Problem: RH Bathing Goal: LTG Patient will bathe with assist, cues/equipment (OT) LTG: Patient will bathe specified number of body parts with assist with/without cues using equipment (position) (OT)  Upgraded JLS     Problem: RH Dressing Goal: LTG Patient will perform lower body dressing w/assist (OT) LTG: Patient will perform lower body dressing with assist, with/without cues in positioning using equipment (OT)  Upgraded JLS  Problem: RH Functional Use of Upper Extremity Goal: LTG Patient will use RT/LT upper extremity as a (OT) LTG: Patient will use right/left upper extremity as a stabilizer/gross assist/diminished/nondominant/dominant level with assist, with/without cues during functional activity (OT)  Upgraded JLS

## 2015-06-20 NOTE — Consult Note (Signed)
NEUROCOGNITIVE TESTING - [facility]IDENTIAL [facility]   MEDICAL NECESSITY:  Ashley [name] was seen on the [facility] for neurocognitive testing owing to the patient's diagnosis of intracerebral hemorrhage.   According to her medical record, Ashley [name] was admitted on 05/21/15 with right facial droop with aphasia, right-sided weakness, and seizures.  She was found to have acute large left fronto-parietal intraparenchymal hemorrhage/ subarachnoid hemorrhage and was started on Keppra for treatment.  She developed vasogenic edema and was treated with hypertonic saline and follow up CCT on 05/25/15 showed stability.  She was initially transferred to inpatient rehabilitation on 05/28/15, but was moved to a step down unit on 06/02/15 due to development of seizures and she was severely hypotensive.  Follow-up CT of her head showed resolving left frontoparietal hemorrhage and Keppra was increased.  She returned to baseline and after being seizure free, was re-admitted to the inpatient rehabilitation unit.   Ashley [name] has been seen twice by my colleague, Dr. Beverly Gust. This provider's most recent note indicated that the patient acknowledged continued depression, despite Prozac use.  Time was reportedly spent processing her current depression and the triggers of her low mood.  From a cognitive perspective, Ashley [name] mentioned that her memory continues to suffer. She was reportedly provided with education regarding recovery expectations for cognitive difficulties following a hemorrhage.   The patient was referred for neuropsychological consultation given the possibility of cognitive sequelae subsequent to the current medical status and in order to assist in treatment planning.   PROCEDURES: [3 units of 96118]  Medical record review Behavioral observations  Neuropsychological testing  Of note, one task was not administered due to certain physical limitations.    TEST RESULTS:   RBANS Indices Scaled Score Percentile Description  Immediate Memory  44 <1 Markedly impaired  Visuospatial/Constructional 69 2 Markedly impaired  Language 78 7 Mildly impaired   Attention N/A N/A N/A  Delayed Memory 44 <1 Markedly impaired  Total Score N/A N/A N/A    RBANS Subtests Raw Score Percentile Description  List Learning 16 <1 Markedly impaired  Story Memory 6 <1 Markedly impaired  Figure Copy 11 <1 Markedly impaired  Line Orientation 16 39 Average  Picture Naming 10 70 Average  Semantic Fluency 13 1 Markedly impaired  Digit Span 10 25 Average  Coding N/A N/A N/A  List Recall 1 <1 Markedly impaired  List Recognition 18 <1 Markedly impaired  Story Recall 2 <1 Markedly impaired  Figure recall 9 1 Markedly impaired   Behavioral Evaluation: Ashley [name] was appropriately dressed for season and situation, and she appeared tidy and well-groomed. Normal posture was noted, though she ambulated with difficulty owing to right-sided motoric deficits. She was friendly and rapport was easily established. Her speech was as expected and she was able to express ideas effectively. She seemed to understand test directions readily. Her affect was appropriately modulated. Attention and motivation were good. Optimal test taking conditions were maintained.   IMPRESSION: Ashley [name] exhibited marked impairments post-stroke in areas of learning and memory, performance/processing speed (as noted via a fluency task), and constructional praxis. Spatial judgment, naming, and simple auditory attention were intact. Mood-wise, she is being seen by Dr. Beverly Gust for coping and supportive counseling throughout this admission. She is also prescribed fluoxetine.   Ashley [name]'s performance is consistent with a diagnosis of Major Neurocognitive Disorder secondary to intracranial hemorrhage. As such, the following recommendations are offered.    RECOMMENDATIONS  Recommendations for treatment  team:  .  When interacting with Ashley [name], directions and information should be provided in a simple, straight forward manner, and the treatment team should avoid giving multiple instructions simultaneously.  Marland Kitchen She will minimally benefit from being provided with multiple trials to learn new skills given the noted memory inefficiencies. In addition, she will more significantly benefit from recognition cueing. HOWEVER, be aware that too much information offered at one time will likely be overwhelming.  . To the extent possible, multitasking should be avoided. . She requires more time than typical to process information. The treatment team may benefit from waiting for a verbal response to information before presenting additional information. This will help assure that she understood directions in therapy, etc.  . Performance will generally be best in a structured, routine, and familiar environment, as opposed to situations involving complex problems.  . Frequent reorientation will be helpful . Establish consistent daily routines . Use of short treatment sessions . Attend carefully to basic physiologic needs (e.g., nutrition, toileting, sleep, etc.) . Maintain as much as possible a quiet treatment environment . Avoid overstimulation . Since emotional factors are adversely impacting the patient's daily life, continued follow-up with Dr. Beverly Gust is recommended.    Recommendations for discharge planning:  . Complete a comprehensive neuropsychological evaluation as an outpatient in 2-3 months. This can be done through Norton Pastel, PsyD by calling the following number: [phone].  . Establish long-term follow-up care with a provider knowledgeable in stroke.  Marland Kitchen Neuroimaging can show time-dependent vulnerability to damage in specific regions, and lesions often evolve ever weeks or months. As such, repeat head CT or brain MRI in 2-3 months might be beneficial.   . Maintain engagement in mentally,  physically and cognitively stimulating activities.  . Strive to maintain a healthy lifestyle (e.g., proper diet and exercise) in order to promote physical, cognitive and emotional health.  . Due to the nature and severity of the symptoms noted during this evaluation, it is recommended that she initially obtain constant care and supervision following this hospitalization.  . The patient should refrain from driving at this time.       Rutha Bouchard, Psy.D.  Clinical Neuropsychologist  Rehabilitation Psychologist

## 2015-06-20 NOTE — Progress Notes (Signed)
Recreational Therapy Session Note  Patient Details  Name: Ashley [name] MRN: [id number] Date of Birth: 05-27-1976 Today's Date: 06/20/2015  Pain: no c/o Skilled Therapeutic Interventions/Progress Updates: Pt doubled with another pt to discuss current diagnosis, progress, & concerns as discharge is approaching. Pt appreciative of being with another young woman who is going through a similar situation. Pt expressed concerns about how recent sroke might effect her relationships with her husband (including intimacy) and kids. Education provided on the above including ways to involve her family in her recovery as partners, not care-givers. Both pts stated they would like to meet again to talk further, both were encouraged by one another. SW made aware of effectiveness of the double and provided with detail on concerns.  Therapy/Group: Individual Therapy   Daneesha Quinteros 06/20/2015, 12:25 PM

## 2015-06-20 NOTE — Patient Care Conference (Signed)
Inpatient RehabilitationTeam Conference and Plan of Care Update Date: 06/20/2015   Time: 11:35 AM    Patient Name: Ashley [name]      Medical Record Number: [id number]  Date of Birth: Jun 15, 1976 Sex: Female         Room/Bed: 4W16C/4W16C-01 Payor Info: Payor: [place] / Plan: [employer] OTHER / Product Type: *No Product type* /    Admitting Diagnosis: ICH Seizure   Admit Date/Time:  06/04/2015  5:04 PM Admission Comments: No comment available   Primary Diagnosis:  Hemiplga fol ntrm intcrbl hemor aff right dominant side Principal Problem: Hemiplga fol ntrm intcrbl hemor aff right dominant side  Patient Active Problem List   Diagnosis Date Noted  . Seizure   . Seizures   . Adjustment disorder with depressed mood   . Contracture of muscle ankle and foot 06/11/2015  . Hemiplga fol ntrm intcrbl hemor aff right dominant side 06/06/2015  . Left-sided intracerebral hemorrhage 06/04/2015  . Seizure disorder as sequela of cerebrovascular accident   . Seizure disorder 06/03/2015  . Sepsis 06/03/2015  . UTI (urinary tract infection) 06/03/2015  . Hypotension 06/02/2015  . Right spastic hemiparesis 05/28/2015  . 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 History of anxiety disorder 05/28/2015  . ICH (intracerebral hemorrhage)   . Seizure disorder, nonconvulsive, with status epilepticus   . Cerebral venous thrombosis of cortical vein   . Cytotoxic cerebral edema   . IVH (intraventricular hemorrhage)   . Term pregnancy 05/07/2015  . Spontaneous vaginal delivery 05/07/2015    Expected Discharge Date: Expected Discharge Date: 06/27/15  Team Members Present: Physician leading conference: Dr. Alysia [name] Social Worker Present: Ashley Alpers, LCSW Nurse Present: Other (comment) Ashley Frederickson, RN) PT Present: Ashley [name], PT OT Present: Ashley [name], OT SLP Present: Ashley [name], SLP PPS Coordinator present : Ashley Nakayama, RN, CRRN     Current  Status/Progress Goal Weekly Team Focus  Medical   no seizure, no change in spasticity off of Baclofen  Mod I/Sup home d/c  d/c planning   Bowel/Bladder   Continent bowel and bladder  Remain continent. No s/s infection.  Monitor   Swallow/Nutrition/ Hydration             ADL's   Supervision for transfers, Min A for bathing/dressing, Min A dynamic standing balance  upgraded to supervision for BADLs  activity tolerance, planning for outing, NMR for right UE working and short term memory   Mobility   min A with transfers and gait using WBQC and posterior leaf spring AFO  SBA ambulation  safety with mobility, progressing from [facility] to NBQC/SPC, family training, safety with all mobility, stairs   Communication   improving independence for use of word finding strategies in conversations  mod I   continue to address higher level word finding strategies   Safety/Cognition/ Behavioral Observations  Mild working memory deficits and complex problem solving, pt is becoming more independent with use of compensatory techniques   supervision   continue to address working memory, complex functional problem solving, continue to provide family education as family is available    Pain   Occasional tylenol for mild headache, effective.  <3  Monitor   Skin   CDI  No injury, no breakdown this admission.  Monitor    Rehab Goals Patient on target to meet rehab goals: Yes Rehab Goals Revised: none *See Care Plan and progress notes for long and short-term goals.  Barriers to Discharge: sensorimotor deficits R UE and  RLE    Possible Resolutions to Barriers:  Cont NMR    Discharge Planning/Teaching Needs:  Pt to return to her home with a family member available 24/7 for supervision.  Husband has been here for therapies to receive education.   Team Discussion:  Pt's voice is getting stronger and she is progressing well.  MD discontinued baclofen and pt is more alert and has not had an increase in  spasticity.  Pt reports feeling "off" cognitively to the the ST, so they will continue to work on this, however pt's speech itself has very few errors now.  Pt needs to use the AFO at home.  PT is also working on the best assistive device for pt to use at home for ambulation and stairs.  OT feels pt is regaining functional use of her right arm and continue to work on reaching out tasks to support this.  Revisions to Treatment Plan:  None - Therapists to plan outing for early next week, prior to d/c, for pt to practice functional tasks in the community setting.   Continued Need for Acute Rehabilitation Level of Care: The patient requires daily medical management by a physician with specialized training in physical medicine and rehabilitation for the following conditions: Daily direction of a multidisciplinary physical rehabilitation program to ensure safe treatment while eliciting the highest outcome that is of practical value to the patient.: Yes Daily medical management of patient stability for increased activity during participation in an intensive rehabilitation regime.: Yes Daily analysis of laboratory values and/or radiology reports with any subsequent need for medication adjustment of medical intervention for : Neurological problems;Other  Ashley [name], Ashley [name] 06/20/2015, 12:36 PM

## 2015-06-20 NOTE — Progress Notes (Signed)
Subjective/Complaints:   No issues overnite, "washed her own hair today"  ROS denies CP, SOB, Abd pain, , bowel and bladder ok   , still notes memory issues Objective: Vital Signs: Blood pressure 91/52, pulse 80, temperature 98.7 F (37.1 C), temperature source Oral, resp. rate 18, weight 63.9 kg (140 lb 14 oz), SpO2 98 %, unknown if currently breastfeeding. No results found. No results found for this or any previous visit (from the past 72 hour(s)).   HEENT: normal Cardio: RRR and no murmur Resp: CTA B/L and unlabored GI: BS positive and NT, ND Extremity:  BS positive and NT, ND Skin:   Central line site healed over, bruising Left side neck Neuro: Alert/Oriented, Abnormal Sensory reduced sensation Right side in LE only , Left side 4-5/5, Abnormal Motor 3-/5 RUE grip, 3- bi and tri ,3- at delt, 2- hip/knee ext RLE and Expressive Aphasic, [place] 1- 2  at biceps femoris, 3 at ankle PF with tight heel cord,  Musc/Skel:  Other no pain with UE or LE ROM, mildly inverted at ankle, no ankle swelling Gen NAD   Assessment/Plan: 1. Functional deficits secondary to Right spastic hemiplegia,dysphagia, and aphasia secondary to large left parietal intracranial hemorrhage   which require 3+ hours per day of interdisciplinary therapy in a comprehensive inpatient rehab setting. Physiatrist is providing close team supervision and 24 hour management of active medical problems listed below. Physiatrist and rehab team continue to assess barriers to discharge/monitor patient progress toward functional and medical goals. Team conference today please see physician documentation under team conference tab, met with team face-to-face to discuss problems,progress, and goals. Formulized individual treatment plan based on medical history, underlying problem and comorbidities. FIM: Function - Bathing Position: Other (comment) (standard tub, bench, sit<>stand) Body parts bathed by patient: Right arm, Left arm,  Abdomen, Chest, Front perineal area, Buttocks, Right upper leg, Left upper leg, Right lower leg, Left lower leg Body parts bathed by helper: Back Assist Level: Touching or steadying assistance(Pt > 75%) Set up : To adjust water temperature  Function- Upper Body Dressing/Undressing What is the patient wearing?: Bra, Pull over shirt/dress Bra - Perfomed by patient: Thread/unthread right bra strap, Thread/unthread left bra strap Bra - Perfomed by helper: Hook/unhook bra (pull down sports bra) Pull over shirt/dress - Perfomed by patient: Thread/unthread right sleeve, Put head through opening, Thread/unthread left sleeve, Pull shirt over trunk Pull over shirt/dress - Perfomed by helper: Pull shirt over trunk Assist Level: Touching or steadying assistance(Pt > 75%) Set up : To obtain clothing/put away Function - Lower Body Dressing/Undressing What is the patient wearing?: Underwear, Pants, Socks, Shoes Position: Wheelchair/chair at sink Underwear - Performed by patient: Thread/unthread right underwear leg, Thread/unthread left underwear leg, Pull underwear up/down Underwear - Performed by helper: Pull underwear up/down Pants- Performed by patient: Thread/unthread right pants leg, Thread/unthread left pants leg, Pull pants up/down, Fasten/unfasten pants Pants- Performed by helper: Pull pants up/down Non-skid slipper socks- Performed by patient: Don/doff right sock, Don/doff left sock Socks - Performed by patient: Don/doff right sock, Don/doff left sock Shoes - Performed by patient: Don/doff left shoe Shoes - Performed by helper: Don/doff right shoe, Fasten right, Fasten left Assist Level: Touching or steadying assistance (Pt > 75%) Set up : To obtain clothing/put away  Function - Toileting Toileting activity did not occur: Safety/medical concerns Toileting steps completed by patient: Adjust clothing prior to toileting, Performs perineal hygiene Toileting steps completed by helper: Adjust  clothing prior to toileting, Performs perineal hygiene, Adjust  clothing after toileting Toileting Assistive Devices: Grab bar or rail Assist level: Touching or steadying assistance (Pt.75%) (stedy lift)  Function - Air cabin crew transfer activity did not occur: Refused Toilet transfer assistive device: Grab bar Mechanical lift: Stedy Assist level to toilet: Touching or steadying assistance (Pt > 75%) Assist level from toilet: Touching or steadying assistance (Pt > 75%) Assist level to bedside commode (at bedside): 2 helpers Assist level from bedside commode (at bedside): Moderate assist (Pt 50 - 74%/lift or lower)  Function - Chair/bed transfer Chair/bed transfer method: Stand pivot, Ambulatory Chair/bed transfer assist level: Touching or steadying assistance (Pt > 75%) Chair/bed transfer assistive device: Cane, Orthosis, Armrests Chair/bed transfer details: Verbal cues for sequencing, Verbal cues for precautions/safety, Visual cues/gestures for precautions/safety, Manual facilitation for weight shifting  Function - Locomotion: Wheelchair Will patient use wheelchair at discharge?: Yes Type: Manual Max wheelchair distance: 150 Assist Level: Supervision or verbal cues Assist Level: Supervision or verbal cues Assist Level: Supervision or verbal cues Function - Locomotion: Ambulation Assistive device: Cane-quad Max distance: 150 Assist level: Touching or steadying assistance (Pt > 75%) Assist level: Touching or steadying assistance (Pt > 75%) Walk 50 feet with 2 turns activity did not occur: Safety/medical concerns Assist level: Touching or steadying assistance (Pt > 75%) Walk 150 feet activity did not occur: Safety/medical concerns Assist level: Touching or steadying assistance (Pt > 75%) Walk 10 feet on uneven surfaces activity did not occur: Safety/medical concerns  Function - Comprehension Comprehension: Auditory Comprehension assist level: Follows complex  conversation/direction with extra time/assistive device  Function - Expression Expression: Verbal Expression assist level: Expresses complex 90% of the time/cues < 10% of the time  Function - Social Interaction Social Interaction assist level: Interacts appropriately with others with medication or extra time (anti-anxiety, antidepressant).  Function - Problem Solving Problem solving assist level: Solves basic 90% of the time/requires cueing < 10% of the time  Function - Memory Memory assist level: Recognizes or recalls 75 - 89% of the time/requires cueing 10 - 24% of the time Patient normally able to recall (first 3 days only): That he or she is in a hospital, Location of own room, Current season  Medical Problem List and Plan: 1. Functional deficits secondary to Right spastic hemiplegia,dysphagia, and aphasia secondary to large left parietal intracranial hemorrhage, also  cog def, Team conference to discuss care  -decrease baclofen to $RemoveBef'5mg'ZaMwKILHpx$  qhs prn  with no increased spasticity,  -AFO RLE---use when up 2. DVT Prophylaxis/Anticoagulation: Pharmaceutical: Lovenox 3. Pain Management: Tylenol prn 4. Mood: LCSW to follow for evaluation and support as cognition/aphasia improves.  5. Neuropsych: This patient is not capable of making decisions on her own behalf. 6. Skin/Wound Care: Routine pressure relief measures.  7. Fluids/Electrolytes/Nutrition: Monitor I/O. Normal fluid and caloric intake       8. Insomnia: improved on trazodone,  9. New onset seizures: with breakthrough now controlled on keppra $RemoveB'1500mg'FSFinxLP$  bid., monitor valproate level ,no further seizures since readmit 10. Hx anxiety , back on prozac $RemoveB'10mg'WVaAerPX$ --appears under control 11.  Ankle contracture- heel cord, Cont night time PRAFO,pt feels it is too large  stretch per PT 12.  Spasticity improved per pt ,   Baclofen to Qhs PRN LOS (Days) 16 A FACE TO FACE EVALUATION WAS PERFORMED  KIRSTEINS,ANDREW E 06/20/2015, 8:22 AM   .akp

## 2015-06-20 NOTE — Progress Notes (Signed)
Physical Therapy Session Note  Patient Details  Name: Ashley [name] MRN: [id number] Date of Birth: 07-31-1976  Today's Date: 06/20/2015 PT Individual Time: 1030-1130 Treatment Session 2: 1300-1330 PT Individual Time Calculation (min): 60 min Treatment Session 2: 30 min  Short Term Goals: Week 3:  PT Short Term Goal 1 (Week 3): = LTG of supervision-min A overall for D/C 9/14  Skilled Therapeutic Interventions/Progress Updates:    Treatment Session 1: Pt received with Rec therapist and another pt with a similar diagnosis, providing peer support with one another. Gait Training - see below for details - focus is on balance, upright posture, and activating R hip IR during gait to achieve neutral foot position. Trial of ambulation with NBQC req same level of support, with much improved safety and sequencing on stairs using forward ascent/descent pattern. W/C Management - PT instructs pt in forced use of B UEs in w/c propulsion - see below for details - pt demonstrates increased ease of locking R w/c brake with R hand and releasing R legrest with R hand. Neuromuscular Reeducation - PT instructs pt in high level dynamic balance activity - side stepping R and L with WBQC req CGA for safety, verbal cues for sequencing - focus on R foot/hip neutral rotation (pt tends to toe out) and upright stance on R leg when stepping with L leg (reducing glute max lurch).   Pt is progressing with all aspects of functional mobility, but continues to need work on normalizing gait pattern, high level balance, and safety with sequencing.   Treatment Session 2: Pt received up in w/c, agreeable to PT session. Pt ambulation with above stated assistance and AD. Neuromuscular Reeducation - PT introduces [place] exercises level A in // bars and pt completes x 10-20 reps each. Pt ended up in w/c with all needs in reach. Continue per PT POC.    Therapy Documentation Precautions:  Precautions Precautions: Fall Precaution Comments:  R flexor synergy in LE during gait; motor apraxia; R hemiplegia; R inattention Restrictions Weight Bearing Restrictions: No Pain: Pain Assessment Pain Assessment: No/denies pain Treatment Session 2: Pt denies pain.    See Function Navigator for Current Functional Status.   Therapy/Group: Individual Therapy  [name] M 06/20/2015, 11:35 AM

## 2015-06-21 ENCOUNTER — Inpatient Hospital Stay (HOSPITAL_COMMUNITY): Payer: BLUE CROSS/BLUE SHIELD

## 2015-06-21 ENCOUNTER — Encounter (HOSPITAL_COMMUNITY): Payer: BLUE CROSS/BLUE SHIELD

## 2015-06-21 ENCOUNTER — Inpatient Hospital Stay (HOSPITAL_COMMUNITY): Payer: BLUE CROSS/BLUE SHIELD | Admitting: Physical Therapy

## 2015-06-21 ENCOUNTER — Inpatient Hospital Stay (HOSPITAL_COMMUNITY): Payer: BLUE CROSS/BLUE SHIELD | Admitting: Speech Pathology

## 2015-06-21 NOTE — Progress Notes (Signed)
Speech Language Pathology Daily Session Note  Patient Details  Name: Ashley [name] MRN: [id number] Date of Birth: October 17, 1975  Today's Date: 06/21/2015 SLP Individual Time: 6644-0347 SLP Individual Time Calculation (min): 84 min  Short Term Goals: Week 3: SLP Short Term Goal 1 (Week 3): Pt will follow abstract, multi-step commands during functional tasks with mod I in 75% of observable opportunities.  SLP Short Term Goal 2 (Week 3): Pt will return demonstration of at least 2 safety precautions during functional tasks with mod I.  SLP Short Term Goal 3 (Week 3): Pt will recognize and correct errors in the moment during semi-complex self care and/or home management tasks with supervision.  SLP Short Term Goal 4 (Week 3): Pt will utilize compensatory memory aids to facilitate recall of new information in >75% of observable opportunities with supervision.  SLP Short Term Goal 5 (Week 3): Pt will utliize compensatory strategies for word finding to convey abstract, complex needs and/or wants with mod I.   SLP Short Term Goal 6 (Week 3): Pt will plan and execute a problem solving strategy during semi-complex self care and/or home management tasks with mod I.    Skilled Therapeutic Interventions:  Pt was seen for skilled ST targeting cognitive goals.  Upon arrival, pt was seated upright in wheelchair awake, alert, and agreeable to participate in [place].  SLP facilitated the session with a complex cooking task targeting functional problem solving for home management.  Pt required supervision during task for safety and had 1 loss of balance when making a quick turn while ambulating from the kitchen table to the sink.  Pt opted to complete most of the food prep work while seated for energy conservation and safety.  She directed aspects of food preparation to SLP which were unsafe (carrying heavy pans while walking with a cane, reaching for things, etc.) for her to do with min question cues.  She sequenced and  problem solved through tasks appropriately while multitasking with supervision cues.  Pt was also able to identify which aspects of today's activity went well and which areas needed improvement.  Pt was returned to room and left with visitors present.  Continue per current plan of care.    Function:  Eating Eating                 Cognition Comprehension Comprehension assist level: Follows complex conversation/direction with extra time/assistive device  Expression   Expression assist level: Expresses complex 90% of the time/cues < 10% of the time  Social Interaction Social Interaction assist level: Interacts appropriately with others with medication or extra time (anti-anxiety, antidepressant).  Problem Solving Problem solving assist level: Solves basic 90% of the time/requires cueing < 10% of the time  Memory Memory assist level: Recognizes or recalls 75 - 89% of the time/requires cueing 10 - 24% of the time    Pain Pain Assessment Pain Assessment: No/denies pain  Therapy/Group: Individual Therapy  Samie Barclift, Selinda Orion 06/21/2015, 3:55 PM

## 2015-06-21 NOTE — Progress Notes (Signed)
Physical Therapy Session Note  Patient Details  Name: Ashley [name] MRN: [id number] Date of Birth: April 24, 1976  Today's Date: 06/21/2015 PT Individual Time: 0930-1030 Treatment Session 2: 1530-1600 PT Individual Time Calculation (min): 60 min Treatment Session 2: 30 min  Short Term Goals: Week 3:  PT Short Term Goal 1 (Week 3): = LTG of supervision-min A overall for D/C 9/14  Skilled Therapeutic Interventions/Progress Updates:    Treatment Session 1: Pt received up in w/c with R AFO in place, ready to work out. Husband joined [place] partway through treatment session and participated in family training - safe to ambulate with pt, and observed floor transfer and stairs, verbalizing understanding, but still needing hands on assist to help. Gait training - see function tab - PT providing close SBA for 50% of ambulation, at other times req CGA-min A with small LOB during gait. On stairs, PT gives pt SPC and one rail - in step to pattern, req min A for balance. Therapeutic Activity - pt completes floor transfer req min A x 2 reps - husband observing second rep. Therapeutic Exercise - PT instructs pt in floor activities she can do at home: tummy time with baby on stomach if husband present to place, remove, and guard baby during tummy time; hamstring stretch; hip adductor stretch; piriformis stretch; prone on elbows back arching stretch.   Pt does well with floor activities and family training with husband initiated. Husband safe to ambulate with pt on level surface - pink safety sheet updated.   Treatment Session 2: Therapeutic Exercise - PT instructs pt in nu-step with AAROM for R hip neutral IR - forced use of R UE/LE at L3, degressing to L2 then L1 - focus on pushing with arm, alternatingly pushing with leg - more difficult for arm to work x 8 minutes total. Gait Training - PT exchanges pt's LBQC to a SBQC and instructs pt in ambulation to/from room to/from gym without AFO - focus on R knee stability and  maintaining balance. Pt is continuing to progress - AFO continues to be appropriate for home use. Continue per PT POC.    Therapy Documentation Precautions:  Precautions Precautions: Fall Precaution Comments: R flexor synergy in LE during gait; motor apraxia; R hemiplegia; R inattention Restrictions Weight Bearing Restrictions: No Pain: Pain Assessment Pain Assessment: No/denies pain Treatment Session 2: Pt denies pain.    See Function Navigator for Current Functional Status.   Therapy/Group: Individual Therapy  Caryl Manas M 06/21/2015, 12:36 PM

## 2015-06-21 NOTE — Progress Notes (Signed)
Subjective/Complaints: Discussed upgraded goals.  No pain today  ROS denies CP, SOB, Abd pain, , bowel and bladder ok   , still notes memory issues Objective: Vital Signs: Blood pressure 89/52, pulse 73, temperature 97.9 F (36.6 C), temperature source Oral, resp. rate 18, weight 63.005 kg (138 lb 14.4 oz), SpO2 92 %, unknown if currently breastfeeding. No results found. No results found for this or any previous visit (from the past 72 hour(s)).   HEENT: normal Cardio: RRR and no murmur Resp: CTA B/L and unlabored GI: BS positive and NT, ND Extremity:  BS positive and NT, ND Skin:   Central line site healed over, bruising Left side neck Neuro: Alert/Oriented, Abnormal Sensory reduced sensation Right side in LE only , Left side 4-5/5, Abnormal Motor 3-/5 RUE grip, 3- bi and tri ,3- at delt, 2- hip/knee ext RLE and Expressive Aphasic, [place] 1- 2  at biceps femoris, 3 at ankle PF with tight heel cord,  Musc/Skel:  Other no pain with UE or LE ROM, mildly inverted at ankle, no ankle swelling Gen NAD   Assessment/Plan: 1. Functional deficits secondary to Right spastic hemiplegia,dysphagia, and aphasia secondary to large left parietal intracranial hemorrhage   which require 3+ hours per day of interdisciplinary therapy in a comprehensive inpatient rehab setting. Physiatrist is providing close team supervision and 24 hour management of active medical problems listed below. Physiatrist and rehab team continue to assess barriers to discharge/monitor patient progress toward functional and medical goals.  FIM: Function - Bathing Position: Shower Body parts bathed by patient: Right arm, Left arm, Abdomen, Chest, Front perineal area, Buttocks, Right upper leg, Left upper leg, Right lower leg, Left lower leg Body parts bathed by helper: Back Assist Level: Touching or steadying assistance(Pt > 75%) Set up : To adjust water temperature  Function- Upper Body Dressing/Undressing What is the  patient wearing?: Bra, Pull over shirt/dress Bra - Perfomed by patient: Thread/unthread right bra strap, Thread/unthread left bra strap, Hook/unhook bra (pull down sports bra) Bra - Perfomed by helper: Hook/unhook bra (pull down sports bra) Pull over shirt/dress - Perfomed by patient: Thread/unthread right sleeve, Put head through opening, Thread/unthread left sleeve, Pull shirt over trunk Pull over shirt/dress - Perfomed by helper: Pull shirt over trunk Assist Level: Supervision or verbal cues Set up : To obtain clothing/put away Function - Lower Body Dressing/Undressing What is the patient wearing?: Underwear, Pants, Socks, Shoes Position: Sitting EOB Underwear - Performed by patient: Thread/unthread right underwear leg, Thread/unthread left underwear leg, Pull underwear up/down Underwear - Performed by helper: Pull underwear up/down Pants- Performed by patient: Thread/unthread right pants leg, Thread/unthread left pants leg, Pull pants up/down, Fasten/unfasten pants Pants- Performed by helper: Pull pants up/down Non-skid slipper socks- Performed by patient: Don/doff right sock, Don/doff left sock Socks - Performed by patient: Don/doff right sock, Don/doff left sock Shoes - Performed by patient: Don/doff left shoe (n/a fasten left) Shoes - Performed by helper: Don/doff right shoe, Fasten right Assist Level: Touching or steadying assistance (Pt > 75%) Set up : To obtain clothing/put away  Function - Toileting Toileting activity did not occur: Safety/medical concerns Toileting steps completed by patient: Adjust clothing prior to toileting, Performs perineal hygiene, Adjust clothing after toileting Toileting steps completed by helper: Adjust clothing prior to toileting, Performs perineal hygiene, Adjust clothing after toileting Toileting Assistive Devices: Grab bar or rail Assist level: Touching or steadying assistance (Pt.75%)  Function - Toilet Transfers Toilet transfer activity did not  occur: Risk analyst transfer  assistive device: Grab bar Mechanical lift: Stedy Assist level to toilet: Supervision or verbal cues Assist level from toilet: Touching or steadying assistance (Pt > 75%) Assist level to bedside commode (at bedside): 2 helpers Assist level from bedside commode (at bedside): Moderate assist (Pt 50 - 74%/lift or lower)  Function - Chair/bed transfer Chair/bed transfer method: Ambulatory Chair/bed transfer assist level: Touching or steadying assistance (Pt > 75%) Chair/bed transfer assistive device: Cane, Orthosis, Armrests Chair/bed transfer details: Manual facilitation for weight shifting, Verbal cues for technique  Function - Locomotion: Wheelchair Will patient use wheelchair at discharge?: Yes Type: Manual Max wheelchair distance: 150 Assist Level: Supervision or verbal cues Assist Level: Supervision or verbal cues Assist Level: Supervision or verbal cues Function - Locomotion: Ambulation Assistive device: Cane-quad, Orthosis Max distance: 200 Assist level: Touching or steadying assistance (Pt > 75%) Assist level: Touching or steadying assistance (Pt > 75%) Walk 50 feet with 2 turns activity did not occur: Safety/medical concerns Assist level: Touching or steadying assistance (Pt > 75%) Walk 150 feet activity did not occur: Safety/medical concerns Assist level: Touching or steadying assistance (Pt > 75%) Walk 10 feet on uneven surfaces activity did not occur: Safety/medical concerns  Function - Comprehension Comprehension: Auditory Comprehension assist level: Follows complex conversation/direction with extra time/assistive device  Function - Expression Expression: Verbal Expression assist level: Expresses complex 90% of the time/cues < 10% of the time  Function - Social Interaction Social Interaction assist level: Interacts appropriately with others with medication or extra time (anti-anxiety, antidepressant).  Function - Problem  Solving Problem solving assist level: Solves basic 90% of the time/requires cueing < 10% of the time  Function - Memory Memory assist level: Recognizes or recalls 75 - 89% of the time/requires cueing 10 - 24% of the time Patient normally able to recall (first 3 days only): That he or she is in a hospital, Location of own room, Current season  Medical Problem List and Plan: 1. Functional deficits secondary to Right spastic hemiplegia,dysphagia, and aphasia secondary to large left parietal intracranial hemorrhage, also  cog def,  -decrease baclofen to 5mg  qhs prn  with no increased spasticity,  -AFO RLE---use when up 2. DVT Prophylaxis/Anticoagulation: Pharmaceutical: Lovenox 3. Pain Management: Tylenol prn 4. Mood: LCSW to follow for evaluation and support as cognition/aphasia improves.  5. Neuropsych: This patient is not capable of making decisions on her own behalf. 6. Skin/Wound Care: Routine pressure relief measures.  7. Fluids/Electrolytes/Nutrition: Monitor I/O. Normal fluid and caloric intake       8. Insomnia: improved on trazodone,  9. New onset seizures: with breakthrough now controlled on keppra 1500mg  bid., monitor valproate level ,no further seizures since readmit 10. Hx anxiety , back on prozac 10mg --appears under control 11.  Ankle contracture- heel cord, Cont night time PRAFO,pt feels it is too large  stretch per PT 12.  Spasticity improved per pt ,   Baclofen to Qhs PRN LOS (Days) 17 A FACE TO FACE EVALUATION WAS PERFORMED  Ashley [name] 06/21/2015, 8:00 AM   .akp

## 2015-06-21 NOTE — Progress Notes (Signed)
Occupational Therapy Session Note  Patient Details  Name: Ashley [name] MRN: [id number] Date of Birth: 04-19-1976  Today's Date: 06/21/2015 OT Individual Time: 0459-9774 OT Individual Time Calculation (min): 60 min   Short Term Goals: Week 3:  OT Short Term Goal 1 (Week 3): STG=LTG d/t short remaining LOS.  Skilled Therapeutic Interventions/Progress Updates: ADL-retraining with focus on dynamic standing balance, safety awareness, discharge planning.   Pt rises from supine to EOB to complete self-feeding (cereal), using right hand to self-feed.   Pt ambulates to shower and completes transfer to tub bench with min guard for safety and vc for placement of RLE.   Pt bathes unassisted but requires steadying assist while she stands to wash buttock.   Pt recovers to w/c for safety and grooms/dressing seated at sink with steadying assist while standing to pull up pants.  Pt requires only intermittent vc to relax RUE and attend to right brake of w/c.   OT educates pt on home safety relating to presence of large dog as increased fall risk.   Pt acknowledges risk and states friends may continue to keep dog during transition back to home.        Therapy Documentation Precautions:  Precautions Precautions: Fall Precaution Comments: R flexor synergy in LE during gait; motor apraxia; R hemiplegia; R inattention Restrictions Weight Bearing Restrictions: No  Pain: Pain Assessment Pain Assessment: No/denies pain   ADL: ADL ADL Comments: see Functional Assessment  See Function Navigator for Current Functional Status.   Therapy/Group: Individual Therapy  [place] 06/21/2015, 11:21 AM

## 2015-06-22 ENCOUNTER — Encounter (HOSPITAL_COMMUNITY): Payer: BLUE CROSS/BLUE SHIELD | Admitting: Physical Therapy

## 2015-06-22 ENCOUNTER — Inpatient Hospital Stay (HOSPITAL_COMMUNITY): Payer: BLUE CROSS/BLUE SHIELD

## 2015-06-22 ENCOUNTER — Encounter (HOSPITAL_COMMUNITY): Payer: Self-pay | Admitting: Physical Medicine and Rehabilitation

## 2015-06-22 ENCOUNTER — Inpatient Hospital Stay (HOSPITAL_COMMUNITY): Payer: BLUE CROSS/BLUE SHIELD | Admitting: Speech Pathology

## 2015-06-22 ENCOUNTER — Inpatient Hospital Stay (HOSPITAL_COMMUNITY): Payer: BLUE CROSS/BLUE SHIELD | Admitting: Physical Therapy

## 2015-06-22 LAB — BASIC METABOLIC PANEL
Anion gap: 7 (ref 5–15)
BUN: 5 mg/dL — AB (ref 6–20)
CALCIUM: 9.6 mg/dL (ref 8.9–10.3)
CO2: 31 mmol/L (ref 22–32)
CREATININE: 0.55 mg/dL (ref 0.44–1.00)
Chloride: 101 mmol/L (ref 101–111)
GFR calc Af Amer: >60 mL/min (ref >60)
GLUCOSE: 68 mg/dL (ref 65–99)
Potassium: 3.6 mmol/L (ref 3.5–5.1)
Sodium: 139 mmol/L (ref 135–145)

## 2015-06-22 LAB — URINALYSIS, ROUTINE W REFLEX MICROSCOPIC
BILIRUBIN URINE: NEGATIVE
GLUCOSE, UA: NEGATIVE mg/dL
HGB URINE DIPSTICK: NEGATIVE
KETONES UR: NEGATIVE mg/dL
LEUKOCYTES UA: NEGATIVE
Nitrite: NEGATIVE
PH: 7 (ref 5.0–8.0)
PROTEIN: NEGATIVE mg/dL
Specific Gravity, Urine: 1.003 — ABNORMAL LOW (ref 1.005–1.030)
Urobilinogen, UA: 0.2 mg/dL (ref 0.0–1.0)

## 2015-06-22 LAB — CBC
HCT: 41.3 % (ref 36.0–46.0)
HEMOGLOBIN: 13.5 g/dL (ref 12.0–15.0)
MCH: 29.6 pg (ref 26.0–34.0)
MCHC: 32.7 g/dL (ref 30.0–36.0)
MCV: 90.6 fL (ref 78.0–100.0)
PLATELETS: 259 10*3/uL (ref 150–400)
RBC: 4.56 MIL/uL (ref 3.87–5.11)
RDW: 13 % (ref 11.5–15.5)
WBC: 7 10*3/uL (ref 4.0–10.5)

## 2015-06-22 MED ORDER — GADOBENATE DIMEGLUMINE 529 MG/ML IV SOLN
15.0000 mL | Freq: Once | INTRAVENOUS | Status: AC | PRN
  Administered 2015-06-22: 15 mL via INTRAVENOUS

## 2015-06-22 NOTE — Discharge Summary (Signed)
Physician Discharge Summary  Patient ID: Ashley [name] MRN: [id number] DOB/AGE: Oct 17, 1975 39 y.o.  Admit date: 05/26/2015 Discharge date: 06/02/2015  Discharge Diagnoses:  Active Problems:   ICH (intracerebral hemorrhage)   Right spastic hemiparesis   Aphasia following nontraumatic intracerebral hemorrhage   Hypotension   Seizure   Discharged Condition: Guarded.   Significant Diagnostic Studies: Ct Head Wo Contrast  06/02/2015   CLINICAL DATA:  Seizure activity  EXAM: CT HEAD WITHOUT CONTRAST  TECHNIQUE: Contiguous axial images were obtained from the base of the skull through the vertex without intravenous contrast.  COMPARISON:  05/25/2015  FINDINGS: Bony calvarium is intact. Persistent hemorrhage is noted within the parenchyma of the frontal parietal lobe on the left. Some extension into the corpus callosum is noted. Mild midline shift of approximately 4 mm from left to right is noted. This is relatively stable from the previous exam. Surrounding edema is noted. No new focal areas of acute hemorrhage, acute infarction or space-occupying mass lesion are noted. The degree of residual blood within the occipital horns of the lateral ventricles has decreased.  IMPRESSION: Persistent but somewhat resolving hemorrhage in the left frontoparietal region. No new focal abnormality is seen.   Electronically Signed   By: Inez Catalina M.D.   On: 06/02/2015 10:22   Ct Head Wo Contrast  05/25/2015   CLINICAL DATA:  Intracranial hemorrhage follow-up  EXAM: CT HEAD WITHOUT CONTRAST  TECHNIQUE: Contiguous axial images were obtained from the base of the skull through the vertex without intravenous contrast.  COMPARISON:  05/23/2015  FINDINGS: Stable, large left posterior frontoparietal lobe hematoma with extension into the corpus callosum similar in size compared with the prior examination with mild surrounding on the lateral ventricle vasogenic edema. There is mild downward mass effect on the lateral ventricle  left greater than right. There is 4 mm of left-to-right midline shift.  There is small volume left hemispheric subarachnoid hemorrhage which has improved compared with 05/23/2015.  There is stable small volume intraventricular hemorrhage dependently within the occipital horns.  There is no other foci of hemorrhage.  The paranasal sinuses are clear. The mastoid sinuses are clear the calvarium is intact. The visualized orbits are unremarkable.  IMPRESSION: 1. Stable, large left posterior frontoparietal lobe hematoma extending into the corpus callosum with mild surrounding vasogenic edema. 4 mm of left-to-right midline shift. No hydrocephalus. 2. Small volume left hemispheric subarachnoid hemorrhage which has improved compared with 05/23/2015. 3. Stable small volume intraventricular hemorrhage dependently within the occipital horns.   Electronically Signed   By: Kathreen Devoid   On: 05/25/2015 08:11   Dg Chest Port 1 View  06/02/2015   CLINICAL DATA:  Sepsis  EXAM: PORTABLE CHEST - 1 VIEW  COMPARISON:  05/21/2015  FINDINGS: The heart size and mediastinal contours are within normal limits. Both lungs are clear. The visualized skeletal structures are unremarkable.  IMPRESSION: No active disease.   Electronically Signed   By: Inez Catalina M.D.   On: 06/02/2015 07:02     Labs:  Basic Metabolic Panel: BMP Latest Ref Rng 06/02/2015 06/02/2015  Glucose 65 - 99 mg/dL 93 -  BUN 6 - 20 mg/dL 9 -  Creatinine 0.44 - 1.00 mg/dL 0.56 0.47  Sodium 135 - 145 mmol/L 135 -  Potassium 3.5 - 5.1 mmol/L 4.4 -  Chloride 101 - 111 mmol/L 100(L) -  CO2 22 - 32 mmol/L 26 -  Calcium 8.9 - 10.3 mg/dL 9.0 -     CBC: CBC Latest Ref Rng  06/02/2015 06/02/2015  WBC 4.0 - 10.5 K/uL 7.4 7.3  Hemoglobin 12.0 - 15.0 g/dL 12.8 13.0  Hematocrit 36.0 - 46.0 % 39.8 39.9  Platelets 150 - 400 K/uL 422(H) 416(H)     CBG: No results for input(s): GLUCAP in the last 168 hours.   Brief HPI:   Ashley [name] is a 39 y.o.postpartum female  admitted on 05/20/15 with complaints of HA since delivery She was admitted on 05/21/15 with right facial droop with aphasia, right sided weakness and seizures. She was found to have acute large left fronto-parietal IPH/SAH and was started on Keppra for treatment. She developed vasogenic edema and was treated with hypertonic saline and fallow up CCT 08/12 showed stability. Work up without definite cerebral venous sinus/cortical vein thrombosis and no anticoagulation needed. Patient with resultant dense left hemiplegia, aphasia as well as dysphagia and was admitted to [facility] on 05/28/15 for rehab.    Hospital Course: Ashley [name] was admitted to rehab 05/26/2015 for inpatient therapies to consist of PT, ST and OT at least three hours five days a week. Past admission physiatrist, therapy team and rehab RN have worked together to provide customized collaborative inpatient rehab. Blood pressures were reasonably controlled and po intake was improving with supplemental food from home.  She has had issues with sleep wake disruption as well as anxiety which has improved with addition of Seroquel. Foley was discontinued on 08/16 and she was started on Keflex for Klebsiella UTI. With increase in activity level, she has had worsening of RLE spasms and Zanaflex was added and titrated to bid to help with symptoms. Blood pressures were controlled and po intake had improved. On 06/02/15 am, patient developed seizures with obtundation. She was noted to be severely hypotensive and was treated with fluid bolus as well as IV Keppra and IV antibiotics due to concerns of sepsis. She was transferred to stepdown unit for closer monitoring.    Disposition:  Acute Hospital  Diet: NPO     Medication List    ASK your doctor about these medications        acetaminophen 500 MG tablet  Commonly known as:  TYLENOL  Take 1,000 mg by mouth every 6 (six) hours as needed for headache.     acyclovir 400 MG tablet  Commonly known  as:  ZOVIRAX  Take 400 mg by mouth 5 (five) times daily as needed (for breakout).     docusate sodium 100 MG capsule  Commonly known as:  COLACE  Take 100 mg by mouth daily.     FLUoxetine 40 MG capsule  Commonly known as:  PROZAC  Take 40 mg by mouth daily.     ibuprofen 600 MG tablet  Commonly known as:  ADVIL,MOTRIN  Take 1 tablet (600 mg total) by mouth every 6 (six) hours as needed for moderate pain or cramping.         SignedBary Leriche 06/22/2015, 8:58 AM

## 2015-06-22 NOTE — Progress Notes (Signed)
Physical Therapy Session Note  Patient Details  Name: Ashley [name] MRN: [id number] Date of Birth: [birth year]  Today's Date: 06/22/2015 PT Individual Time: 1430-1440 PT Individual Time Calculation (min): 10 min   Short Term Goals: Week 3:  PT Short Term Goal 1 (Week 3): = LTG of supervision-min A overall for D/C 9/14  Skilled Therapeutic Interventions/Progress Updates:    Pt received ambulating from bathroom with husband - AFO in place and using quad cane - passed off to PT. Gait Training - see function tab for details - steadying assist for balance help - gait training from room to gym interrupted by neurologist. Pt assisted back to w/c and neurologist performed examination and pt/family education re: poc during remainder of PT session. Continue per PT POC.   Therapy Documentation Precautions:  Precautions Precautions: Fall Precaution Comments: R flexor synergy in LE during gait; motor apraxia; R hemiplegia; R inattention Restrictions Weight Bearing Restrictions: No General: PT Amount of Missed Time (min): 20 Minutes PT Missed Treatment Reason: Other (Comment) (neurologist in room completing pt examination and pt/family education re: POC) Pain: Pain Assessment Pain Assessment: No/denies pain   See Function Navigator for Current Functional Status.   Therapy/Group: Individual Therapy  Andersyn Fragoso M 06/22/2015, 4:20 PM

## 2015-06-22 NOTE — Progress Notes (Addendum)
Subjective/Complaints: Per husband, speech was worse when she got up this am. No dizziness, no increased weakness. No new swallowing issues    Difficulty naming one of her meds.  No pain today  ROS denies CP, SOB, Abd pain, , bowel and bladder ok   , still notes memory issues Objective: Vital Signs: Blood pressure 91/67, pulse 80, temperature 98.1 F (36.7 C), temperature source Oral, resp. rate 18, weight 63.005 kg (138 lb 14.4 oz), SpO2 96 %, unknown if currently breastfeeding. No results found. No results found for this or any previous visit (from the past 72 hour(s)).   HEENT: normal Cardio: RRR and no murmur Resp: CTA B/L and unlabored GI: BS positive and NT, ND Extremity:  BS positive and NT, ND Skin:   Central line site healed over, bruising Left side neck Neuro: Alert/Oriented, Abnormal Sensory reduced sensation Right side in LE and right thumb , Left side 5/5, Abnormal Motor 3-/5 RUE grip, 3- bi and tri ,3- at delt, 3- hip/knee ext RLE and Expressive Aphasic, [place] 1- 2  at biceps femoris, 3 at ankle PF with tight heel cord,  Musc/Skel:  Other no pain with UE or LE ROM, mildly inverted at ankle, no ankle swelling Gen NAD   Assessment/Plan: 1. Functional deficits secondary to Right spastic hemiplegia,dysphagia, and aphasia secondary to large left parietal intracranial hemorrhage   which require 3+ hours per day of interdisciplinary therapy in a comprehensive inpatient rehab setting. Physiatrist is providing close team supervision and 24 hour management of active medical problems listed below. Physiatrist and rehab team continue to assess barriers to discharge/monitor patient progress toward functional and medical goals.  Some decreased recall/naming of a medication today, no clear cut signs of a new CVA, check for dehydration given low BP, check UA (no dysuria)  FIM: Function - Bathing Position: Shower Body parts bathed by patient: Right arm, Left arm, Chest, Abdomen,  Front perineal area, Buttocks, Right upper leg, Left upper leg, Left lower leg, Right lower leg Body parts bathed by helper: Back Bathing not applicable: Right arm, Left arm, Chest, Abdomen, Front perineal area, Buttocks, Right upper leg, Left upper leg, Right lower leg, Left lower leg Assist Level: Touching or steadying assistance(Pt > 75%) Set up : To adjust water temperature  Function- Upper Body Dressing/Undressing What is the patient wearing?: Bra, Pull over shirt/dress Bra - Perfomed by patient: Thread/unthread right bra strap, Thread/unthread left bra strap, Hook/unhook bra (pull down sports bra) Bra - Perfomed by helper: Hook/unhook bra (pull down sports bra) Pull over shirt/dress - Perfomed by patient: Thread/unthread right sleeve, Thread/unthread left sleeve, Put head through opening, Pull shirt over trunk Pull over shirt/dress - Perfomed by helper: Pull shirt over trunk Assist Level: More than reasonable time Set up : To obtain clothing/put away Function - Lower Body Dressing/Undressing What is the patient wearing?: Underwear, Pants, Socks, Shoes, AFO Position: Standing at sink Underwear - Performed by patient: Thread/unthread right underwear leg, Thread/unthread left underwear leg, Pull underwear up/down Underwear - Performed by helper: Pull underwear up/down Pants- Performed by patient: Thread/unthread right pants leg, Thread/unthread left pants leg, Pull pants up/down, Fasten/unfasten pants Pants- Performed by helper: Pull pants up/down Non-skid slipper socks- Performed by patient: Don/doff right sock, Don/doff left sock Socks - Performed by patient: Don/doff right sock, Don/doff left sock Shoes - Performed by patient: Don/doff left shoe Shoes - Performed by helper: Fasten right AFO - Performed by helper: Don/doff right AFO Assist Level: Touching or steadying assistance (Pt >  75%) Set up : To obtain clothing/put away  Function - Toileting Toileting activity did not occur:  Safety/medical concerns Toileting steps completed by patient: Adjust clothing prior to toileting, Performs perineal hygiene, Adjust clothing after toileting Toileting steps completed by helper: Adjust clothing prior to toileting, Performs perineal hygiene, Adjust clothing after toileting Toileting Assistive Devices: Grab bar or rail Assist level: Touching or steadying assistance (Pt.75%)  Function - Air cabin crew transfer activity did not occur: Refused Toilet transfer assistive device: Grab bar Mechanical lift: Stedy Assist level to toilet: Supervision or verbal cues Assist level from toilet: Touching or steadying assistance (Pt > 75%) Assist level to bedside commode (at bedside): 2 helpers Assist level from bedside commode (at bedside): Moderate assist (Pt 50 - 74%/lift or lower)  Function - Chair/bed transfer Chair/bed transfer method: Stand pivot, Ambulatory Chair/bed transfer assist level: Supervision or verbal cues Chair/bed transfer assistive device: Armrests, Cane, Orthosis Chair/bed transfer details: Verbal cues for precautions/safety  Function - Locomotion: Wheelchair Will patient use wheelchair at discharge?: Yes Type: Manual Max wheelchair distance: 150 Assist Level: Supervision or verbal cues Assist Level: Supervision or verbal cues Assist Level: Supervision or verbal cues Function - Locomotion: Ambulation Assistive device: Cane-quad, Orthosis Max distance: 200 Assist level: Touching or steadying assistance (Pt > 75%) Assist level: Supervision or verbal cues Walk 50 feet with 2 turns activity did not occur: Safety/medical concerns Assist level: Touching or steadying assistance (Pt > 75%) Walk 150 feet activity did not occur: Safety/medical concerns Assist level: Touching or steadying assistance (Pt > 75%) Walk 10 feet on uneven surfaces activity did not occur: Safety/medical concerns  Function - Comprehension Comprehension: Auditory Comprehension assist  level: Follows complex conversation/direction with extra time/assistive device  Function - Expression Expression: Verbal Expression assist level: Expresses complex 90% of the time/cues < 10% of the time  Function - Social Interaction Social Interaction assist level: Interacts appropriately with others with medication or extra time (anti-anxiety, antidepressant).  Function - Problem Solving Problem solving assist level: Solves basic 90% of the time/requires cueing < 10% of the time  Function - Memory Memory assist level: Recognizes or recalls 75 - 89% of the time/requires cueing 10 - 24% of the time Patient normally able to recall (first 3 days only): That he or she is in a hospital, Location of own room, Current season  Medical Problem List and Plan: 1. Functional deficits secondary to Right spastic hemiplegia,dysphagia, and aphasia secondary to large left parietal intracranial hemorrhage, also  cog def,  -decrease baclofen to 5mg  qhs prn  with no increased spasticity,  -AFO RLE---use when up 2. DVT Prophylaxis/Anticoagulation: Pharmaceutical: Lovenox 3. Pain Management: Tylenol prn 4. Mood: LCSW to follow for evaluation and support as cognition/aphasia improves.  5. Neuropsych: This patient is not capable of making decisions on her own behalf. 6. Skin/Wound Care: Routine pressure relief measures.  7. Fluids/Electrolytes/Nutrition: Monitor I/O. Normal fluid and caloric intake       8. Insomnia: improved on trazodone,  9. Post CVA seizures: with breakthrough now controlled on keppra 1500mg  bid,no further seizures since readmit, speech complaints may be related to Keppra dose 10. Hx anxiety , back on prozac 10mg --appears under control, this may be contributing to her speech issues,  11.  Ankle contracture- heel cord, Cont night time PRAFO,pt feels it is too large  stretch per PT 12.  Spasticity improved per pt ,   Baclofen to Qhs PRN LOS (Days) 18 A FACE TO FACE EVALUATION WAS  PERFORMED  Alysia Penna  E 06/22/2015, 7:56 AM   .akp

## 2015-06-22 NOTE — Progress Notes (Addendum)
Physical Therapy Session Note  Patient Details  Name: Ashley [name] MRN: [id number] Date of Birth: 08/31/1976  Today's Date: 06/22/2015 PT Individual Time: 1115-1130 PT Individual Time Calculation (min): 15 min  Missed PT Individual Time: 30 min; pt taken for MRI  Short Term Goals: Week 3:  PT Short Term Goal 1 (Week 3): = LTG of supervision-min A overall for D/C 9/14  Skilled Therapeutic Interventions/Progress Updates:    Pt received seated in recliner; no c/o pain and frustrated with new onset of difficulty speaking however agreeable to treatment. Standing NMR for RLE/RUE performed with pt reaching RUE to table on R side, retrieving objects of various size, and placing on bed to pt's L side. Performed to improve RLE weight bearing and weight shifting, reaching outside BOS with dynamic balance task, RUE coordination and [place], standing tolerance and endurance. Performed x2 trials with second trial pt returning objects from bed to table. Seated rest break in between attempts. Transport staff arrived to take pt to MRI; stand pivot transfer with orthosis and use of arm rests with supervision overall, set-up of w/c prior to transfer. Handoff to transport staff at completion of session.  Therapy Documentation Precautions:  Precautions Precautions: Fall Precaution Comments: R flexor synergy in LE during gait; motor apraxia; R hemiplegia; R inattention Restrictions Weight Bearing Restrictions: No Pain: Pain Assessment Pain Assessment: No/denies pain Pain Score: 0-No pain   See Function Navigator for Current Functional Status.   Therapy/Group: Individual Therapy  Luberta Mutter 06/22/2015, 11:51 AM

## 2015-06-22 NOTE — Consult Note (Signed)
Neuropsychology Psychosocial - Confidential [facility] _____________________________________________________________________________   Mrs. Jerlene Rockers was previously seen for a psychodiagnostic evaluation and a follow-up psychosocial for management of adjustment disorder with depressed mood in the setting of hemorrhage.  She then underwent a brief neurocognitive assessment with Norton Pastel, PsyD., who documented multiple impaired cognitive abilities, at the level of a Major Neurocognitive Disorder, secondary to stroke.  She was seen again today by the neuropsychologist, in the presence of her husband, to discuss neuropsychological screening findings and to provide continued support.      Results of testing were explained to Mrs. Hershkowitz and her husband and they seemed to understand.  All questions were answered.  In addition, time was spent processing adjustment to illness with Mrs. Impson and her husband.  We discussed her fears about how her illness may adversely impact her family and specifically her marriage, as well as the challenges she has faced individually surrounding this.  Both parties were provided with validation regarding their experiences and in normalizing emotions expressed.  Time was spent exploring their communication patterns as a couple and in practicing more effective communication techniques that can be utilized now and going forward. Given Mrs. Tawney's continued individual struggles surrounding her medical condition, she was encouraged to seek individual psychotherapy as an outpatient post-discharge and she was agreeable to that.  Contact information on providers in her area should be included in her discharge paperwork for that purpose.    DIAGNOSES: Hemorrhage Adjustment disorder with depressed mood  Greater than 50% of this visit was spent educating the patient about the possible diagnosis, prognosis, management plan, and in coordination of  care.   Marlane Hatcher, PsyD Clinical Neuropsychologist

## 2015-06-22 NOTE — Progress Notes (Signed)
Occupational Therapy Note  Patient Details  Name: Ashley [name] MRN: [id number] Date of Birth: October 29, 1975  Pt missed 60 mins OT community outing session secondary to medical issues which warranted consult from neurology.     Mozella Rexrode OTR/L 06/22/2015, 4:39 PM

## 2015-06-22 NOTE — Progress Notes (Addendum)
Physical Therapy Note  Patient Details  Name: Sasha Rogel MRN: [id number] Date of Birth: [birth year] Today's Date: 06/22/2015  Pt initially scheduled for community outing today to grocery store with PT and OT.  Secondary to changes in patient's speech and cognition (as observed by OT and SLP) and consult to Neurology for further evaluation, pt will not participate in community outing today.  Individual PT session scheduled for pm to allow pt to receive full therapy time for today.   Raylene Everts Faucette 06/22/2015, 7:20 PM (late note)

## 2015-06-22 NOTE — Progress Notes (Signed)
Speech Language Pathology Daily Session Note  Patient Details  Name: Ashley [name] MRN: [id number] Date of Birth: 06/29/1976  Today's Date: 06/22/2015 SLP Individual Time: 1005-1100 SLP Individual Time Calculation (min): 55 min  Short Term Goals: Week 3: SLP Short Term Goal 1 (Week 3): Pt will follow abstract, multi-step commands during functional tasks with mod I in 75% of observable opportunities.  SLP Short Term Goal 2 (Week 3): Pt will return demonstration of at least 2 safety precautions during functional tasks with mod I.  SLP Short Term Goal 3 (Week 3): Pt will recognize and correct errors in the moment during semi-complex self care and/or home management tasks with supervision.  SLP Short Term Goal 4 (Week 3): Pt will utilize compensatory memory aids to facilitate recall of new information in >75% of observable opportunities with supervision.  SLP Short Term Goal 5 (Week 3): Pt will utliize compensatory strategies for word finding to convey abstract, complex needs and/or wants with mod I.   SLP Short Term Goal 6 (Week 3): Pt will plan and execute a problem solving strategy during semi-complex self care and/or home management tasks with mod I.    Skilled Therapeutic Interventions:  Pt was seen for skilled ST targeting cognitive-linguistic goals.  Per report, pt noted with increase in word finding difficulties this AM and is pending medical work up to determine etiology.  Upon arrival, pt was seated upright in recliner, very anxious, and tearful regarding her current function.  Pt with noticeably worsened verbal, apraxic errors with prevalent phonemic paraphasias, dysfluent speech pattern, and increased word for word finding.  Pt appeared aware of errors in ~75% of opportunities and could correct errors with increased time and min assist verbal cues.  Pt also demonstrated increased effort for written communication and comprehension of multi-unit commands.  Suspect that pt's anxiety is at some  level exacerbating her communication difficulties; however, can not rule out underlying neurological or medical etiology given the significant decline in function from yesterday's therapy session and today.  Spoke with PA about concerns and will await results of medical work up, including repeat MRI.  Continue per current plan of care unless otherwise indicated.     Function:  Eating Eating        Cognition Comprehension Comprehension assist level: Follows complex conversation/direction with extra time/assistive device  Expression   Expression assist level: Expresses basic 75 - 89% of the time/requires cueing 10 - 24% of the time. Needs helper to occlude trach/needs to repeat words.  Social Interaction Social Interaction assist level: Interacts appropriately with others with medication or extra time (anti-anxiety, antidepressant).  Problem Solving Problem solving assist level: Solves basic 90% of the time/requires cueing < 10% of the time  Memory Memory assist level: Recognizes or recalls 75 - 89% of the time/requires cueing 10 - 24% of the time    Pain Pain Assessment Pain Assessment: No/denies pain  Therapy/Group: Individual Therapy  Ashley [name], Selinda Orion 06/22/2015, 4:19 PM

## 2015-06-22 NOTE — Progress Notes (Signed)
Occupational Therapy Session Note  Patient Details  Name: Ashley [name] MRN: [id number] Date of Birth: [birth year]  Today's Date: 06/22/2015 OT Individual Time: 830-930 60 Min  Short Term Goals: Week 3:  OT Short Term Goal 1 (Week 3): STG=LTG d/t short remaining LOS.  Skilled Therapeutic Interventions/Progress Updates: ADL-retraining with focus on attention, sequencing, dynamic standing balance, functional communication, and functional mobility using quad-tipped cane.   Pt received in w/c with RN attending, tearful and anxious d/t observed change in her speech (impaired fluency and articulation).   MD aware, consulting for continued assessment to include continued engagement in therapies.   Pt alerted her husband who arrived and assisted temporarily with mobility using quad-cane.   Pt eventually agreed to continue BADL session at shower level after much encouragement from her husband and staff.   No observed change with functional mobility and transfers however pt required additional cues to sequence, remain focused on task, and to problem-solve.   Difficult to assess if performance decline d/t physiologic or psychologic changes.   Pt completed session with overall min assist to steady while standing at shower to bathe buttocks and at sink to don pants.       Therapy Documentation Precautions:  Precautions Precautions: Fall Precaution Comments: R flexor synergy in LE during gait; motor apraxia; R hemiplegia; R inattention Restrictions Weight Bearing Restrictions: No  Vital Signs: Therapy Vitals Pulse Rate: 88 BP: 108/72 mmHg Patient Position (if appropriate): Sitting   Pain: No/denies pain    ADL: ADL ADL Comments: see Functional Assessment   See Function Navigator for Current Functional Status.   Therapy/Group: Individual Therapy  [name] 06/22/2015, 10:15 AM

## 2015-06-22 NOTE — Consult Note (Addendum)
Referring Physician: Dr. Read Drivers    Chief Complaint: worsening speech difficulty  HPI: Ashley [name] is an 39 y.o. female admitted on 05/21/15 for severe HA for several days and developed right hemiparesis and aphasia as well as right focal seizure. She was 2 weeks post partum at that time. She was found to have left parietal ICH with IVH, cerebral edema, obstructive hydrocephalus and SAH. MRA and MRV were unremarkable. Concerning for cerebral cortical vein thrombosis not seen on MRV, however, RVCS was also on differential. She was treated with 3% saline and was stabilized over time. She also had a seizure, EEG showed left PLEDs, was put on Keppra for seizure control. She was then discharged to CIR after stabilization.  After admitted to CIR, about one week ago, patient had seizure episode again, Keppra was increased to 1500 twice a day. Since then patient had no more seizure activity. She is working with PT OT and speech, had made great progress. She is able to walk with cane and making sentences and was scheduled to discharge next Wednesday. However, as per therapist, her language seems getting worse over the last 2 days, with more stuttering, and words finding difficulties, especially when patient more anxious and eager to speak. No decline of any arm leg weakness, no seizure activity. Neurology was consulted for further evaluation.   Past Medical History  Diagnosis Date  . Anxiety   . ICH (intracerebral hemorrhage)     Past Surgical History  Procedure Laterality Date  . Dilation and curettage of uterus      Family History  Problem Relation Age of Onset  . Cancer Mother   . Cancer Father     pancreatic   Social History:  reports that she has never smoked. She has never used smokeless tobacco. She reports that she drinks alcohol. She reports that she does not use illicit drugs.  Allergies: No Known Allergies  Medications:  Prior to Admission:  Prescriptions prior to admission   Medication Sig Dispense Refill Last Dose  . acetaminophen (TYLENOL) 500 MG tablet Take 1,000 mg by mouth every 6 (six) hours as needed for headache.   Unk  . acyclovir (ZOVIRAX) 400 MG tablet Take 400 mg by mouth 5 (five) times daily as needed (for breakout).    Unk  . docusate sodium (COLACE) 100 MG capsule Take 100 mg by mouth daily.   Unk  . FLUoxetine (PROZAC) 40 MG capsule Take 40 mg by mouth daily.   Unk  . ibuprofen (ADVIL,MOTRIN) 600 MG tablet Take 1 tablet (600 mg total) by mouth every 6 (six) hours as needed for moderate pain or cramping. 40 tablet 0 Unk  . levETIRAcetam (KEPPRA) 750 MG tablet Take 2 tablets (1,500 mg total) by mouth 2 (two) times daily.     Marland Kitchen sulfamethoxazole-trimethoprim (BACTRIM DS,SEPTRA DS) 800-160 MG per tablet Take 1 tablet by mouth every 12 (twelve) hours. Through 06/09/15, then stop     . traZODone (DESYREL) 50 MG tablet Take 1 tablet (50 mg total) by mouth at bedtime as needed for sleep.       ROS: ROS reviewed in 14 systems all negative except mentioned in HPI.  Physical Examination:  Temp:  [98.1 F (36.7 C)] 98.1 F (36.7 C) (09/09 0609) Pulse Rate:  [80-88] 88 (09/09 0832) Resp:  [18] 18 (09/09 0609) BP: (91-110)/(64-72) 108/72 mmHg (09/09 0833) SpO2:  [96 %] 96 % (09/09 0609)  General - Well nourished, well developed, in no apparent distress.  Ophthalmologic -  Sharp disc margins OU.  Cardiovascular - Regular rate and rhythm with no murmur.  Mental Status -  Level of arousal and orientation to time, place, and person were intact. Language exam showed fluent speech with occasional paraphasic errors, follow simple and complex commands, naming intact, repetition with occasional paraphasic errors. However, her hesitancy or paraphasic errors occurs most time in the setting of anxious and eager to speak.  Fund of Knowledge was assessed and was intact.  Cranial Nerves II - XII - II - Visual field intact OU. III, IV, VI - Extraocular movements  intact. V - Facial sensation intact bilaterally. VII - mild right nasolabial fold flattening. VIII - Hearing & vestibular intact bilaterally. X - Palate elevates symmetrically. XI - Chin turning & shoulder shrug intact bilaterally. XII - Tongue protrusion intact.  Motor Strength - The patient's strength was RUE 5/5 detoid, 5-/5 bicep and 4/5 tricep and 4/5 finger grip, RLE limited exam due to brace, but at least 4/5, LUE and LLE 5/5, and pronator drift was present on the right.  Bulk was normal and fasciculations were absent.   Motor Tone - Muscle tone was assessed at the neck and appendages and was normal.  Reflexes - The patient's reflexes were 1+ in all extremities and she had no pathological reflexes.  Sensory - Light touch, temperature/pinprick were assessed and were symmetrical.    Coordination - The patient had normal movements in the hands with no ataxia or dysmetria, but right UE significantly slow but no ataxia.  Tremor was absent.  Gait and Station - deferred to PT.   Results for orders placed or performed during the hospital encounter of 06/04/15 (from the past 48 hour(s))  Urinalysis, Routine w reflex microscopic (not at [facility])     Status: Abnormal   Collection Time: 06/22/15 11:04 AM  Result Value Ref Range   Color, Urine YELLOW YELLOW   APPearance CLEAR CLEAR   Specific Gravity, Urine 1.003 (L) 1.005 - 1.030   pH 7.0 5.0 - 8.0   Glucose, UA NEGATIVE NEGATIVE mg/dL   Hgb urine dipstick NEGATIVE NEGATIVE   Bilirubin Urine NEGATIVE NEGATIVE   Ketones, ur NEGATIVE NEGATIVE mg/dL   Protein, ur NEGATIVE NEGATIVE mg/dL   Urobilinogen, UA 0.2 0.0 - 1.0 mg/dL   Nitrite NEGATIVE NEGATIVE   Leukocytes, UA NEGATIVE NEGATIVE    Comment: MICROSCOPIC NOT DONE ON URINES WITH NEGATIVE PROTEIN, BLOOD, LEUKOCYTES, NITRITE, OR GLUCOSE <1000 mg/dL.  Basic metabolic panel     Status: Abnormal   Collection Time: 06/22/15 11:10 AM  Result Value Ref Range   Sodium 139 135 - 145 mmol/L    Potassium 3.6 3.5 - 5.1 mmol/L   Chloride 101 101 - 111 mmol/L   CO2 31 22 - 32 mmol/L   Glucose, Bld 68 65 - 99 mg/dL   BUN 5 (L) 6 - 20 mg/dL   Creatinine, Ser 8.29 0.44 - 1.00 mg/dL   Calcium 9.6 8.9 - 56.2 mg/dL   GFR calc non Af Amer >60 >60 mL/min   GFR calc Af Amer >60 >60 mL/min    Comment: (NOTE) The eGFR has been calculated using the CKD EPI equation. This calculation has not been validated in all clinical situations. eGFR's persistently <60 mL/min signify possible Chronic Kidney Disease.    Anion gap 7 5 - 15  CBC     Status: None   Collection Time: 06/22/15 11:10 AM  Result Value Ref Range   WBC 7.0 4.0 - 10.5 K/uL  RBC 4.56 3.87 - 5.11 MIL/uL   Hemoglobin 13.5 12.0 - 15.0 g/dL   HCT 41.3 36.0 - 46.0 %   MCV 90.6 78.0 - 100.0 fL   MCH 29.6 26.0 - 34.0 pg   MCHC 32.7 30.0 - 36.0 g/dL   RDW 13.0 11.5 - 15.5 %   Platelets 259 150 - 400 K/uL   Mr Jeri Cos Wo Contrast  06/22/2015   IMPRESSION: 1. Acute gyral swelling and T2 hyperintensity involving the left hippocampal formation, left insula, and operculum. No associated restricted diffusion, enhancement, or mass effect. Favor these are postictal changes. 2. Fairly large intra-axial hemorrhage of the posterior superior left hemisphere which cross midline through the body of the corpus callosum re- demonstrated. Mild regression of blood products since August. Mild peripheral enhancement without suspicious features at this time. Regressed cerebral edema and regional mass effect. Recommend repeat brain MRI without and with contrast once the hematoma has resolved. 3. Residual left superior and lateral convexity subarachnoid hemorrhage and/or superficial siderosis. Trace left vertex subdural hemorrhage suspected. Resolved intraventricular hemorrhage and no ventriculomegaly.     Assessment: 39 y.o. female was admitted on 05/21/2015 for left parietal ICH with IVH, cerebral edema, partial seizure, obstructive hydrocephalus and SAH.  Etiology for ICH is not clear, possibly due to postpartum RVCS vs. CVT. No anticoagulation was given. She was discharged to CIR on 05/25/2015. She was making progress in CIR for the last 4 weeks. However started to have worsening language deficit with more staggering, word finding difficulties for the last 2 days. MRI repeated today showed resolving hematoma, but left hippocampal T2 hyperintensity suggesting post ictal changes. Her last seizure about one week ago, currently on Keppra 1500 twice a day, no seizure since then. Neuro exam showed occasional aphasic errors in the setting of anxiety. For the last 2 days, she had increased work load for therapy, increased exercises by herself, anxious to go home, had stress regarding the custody of her children. Her speech deficit most likely due to stress, anxiety, and fatigue.   Plan: - MRI showed resolving hematoma - MRI also showed left hippocampal T2 hyperintensity suggesting post ictal changes - Will repeat EEG, and adjusting seizure medication if needed. - Continued Keppra 1500 twice a day at this time - Continue Prozac for post stroke depression. Patient needed clinic follow-up to evaluate for depression too. - Recommend continued PT OT and speech - Decreased stress levels, cope with anxiety - Gradually increase workload and exercise level - will follow  Rosalin Hawking, MD PhD Stroke Neurology 06/22/2015 6:35 PM

## 2015-06-23 ENCOUNTER — Inpatient Hospital Stay (HOSPITAL_COMMUNITY): Payer: BLUE CROSS/BLUE SHIELD

## 2015-06-23 ENCOUNTER — Inpatient Hospital Stay (HOSPITAL_COMMUNITY): Payer: BLUE CROSS/BLUE SHIELD | Admitting: Physical Therapy

## 2015-06-23 LAB — URINE CULTURE

## 2015-06-23 NOTE — Progress Notes (Signed)
STROKE TEAM PROGRESS NOTE   SUBJECTIVE (INTERVAL HISTORY) Please refer to Dr. Beryl Meager note.   OBJECTIVE Temp:  [98.1 F (36.7 C)] 98.1 F (36.7 C) (09/10 0500) Pulse Rate:  [79-85] 79 (09/10 0500) Cardiac Rhythm:  [-]  Resp:  [16-18] 16 (09/10 0500) BP: (89-101)/(52-65) 89/52 mmHg (09/10 0500) SpO2:  [98 %-100 %] 100 % (09/10 0500)  CBC:  Recent Labs Lab 06/22/15 1110  WBC 7.0  HGB 13.5  HCT 41.3  MCV 90.6  PLT 585    Basic Metabolic Panel:  Recent Labs Lab 06/22/15 1110  NA 139  K 3.6  CL 101  CO2 31  GLUCOSE 68  BUN 5*  CREATININE 0.55  CALCIUM 9.6    Lipid Panel: No results found for: CHOL, TRIG, HDL, CHOLHDL, VLDL, LDLCALC HgbA1c: No results found for: HGBA1C Urine Drug Screen: No results found for: LABOPIA, COCAINSCRNUR, LABBENZ, AMPHETMU, THCU, LABBARB    IMAGING  Mr Kizzie Fantasia Contrast 06/22/2015    1. Acute gyral swelling and T2 hyperintensity involving the left hippocampal formation, left insula, and operculum. No associated restricted diffusion, enhancement, or mass effect. Favor these are postictal changes.  2. Fairly large intra-axial hemorrhage of the posterior superior left hemisphere which cross midline through the body of the corpus callosum re- demonstrated. Mild regression of blood products since August. Mild peripheral enhancement without suspicious features at this time. Regressed cerebral edema and regional mass effect. Recommend repeat brain MRI without and with contrast once the hematoma has resolved.  3. Residual left superior and lateral convexity subarachnoid hemorrhage and/or superficial siderosis. Trace left vertex subdural hemorrhage suspected. Resolved intraventricular hemorrhage and no ventriculomegaly.      PHYSICAL EXAM Please refer to Dr. Beryl Meager note.      ASSESSMENT/PLAN Ashley [name] is a 39 y.o. female with history of migraine headaches, anxiety, and status post childbirth 05/07/2015 who was admitted to  [facility]/21/2015 presenting with a headache, right-sided weakness, and seizure activity. She did not receive IV t-PA due to a left parietal lobe intraparenchymal hemorrhage. Admitted to CIR 05/25/2015. Neurology reconsulted 06/22/2015 secondary to speech difficulties and staggering. Deficits felt to be secondary to stress, anxiety and fatigue.  Stroke:  Dominant intraparenchymal hemorrhage of unknown etiology.  Resultant  right hemiparesis and seizure activity  MRI  as above  MRA  05/21/2015 - no significant abnormalities noted  VTE prophylaxis - Lovenox  Diet regular Room service appropriate?: Yes; Fluid consistency:: Thin  no antithrombotic prior to admission, now on no antithrombotic secondary to hemorrhagic stroke  Ongoing aggressive stroke risk factor management  Therapy recommendations: Pending  Disposition:  Pending    Seizure activity  On Keppra 1500 mg twice daily - continue current dose.  EEG - pending    Anxiety  Prozac 10 mg daily and trazodone 50 mg at bedtime as needed for sleep.    Other Stroke Risk Factors  ETOH use  Migraines  Postpartum    Other Active Problems  Anxiety  PLAN  Await EEG  Follow-up MRI to be performed in the future as recommended by radiology.  Hospital day # [address] PA-C [facility] Pager [phone] 06/23/2015, 9:21 AM     To contact Stroke Continuity provider, please refer to [email or web address] After hours, contact General Neurology

## 2015-06-23 NOTE — Progress Notes (Signed)
Physical Therapy Session Note  Patient Details  Name: Ashley [name] MRN: [id number] Date of Birth: [birth year]  Today's Date: 06/23/2015 PT Individual Time: 1130-1200 PT Individual Time Calculation (min): 30 min   Short Term Goals: Week 1:  PT Short Term Goal 1 (Week 1): Pt will transfer w/c to/from bed req min A via stand-step transfer to pt's L. PT Short Term Goal 1 - Progress (Week 1): Met PT Short Term Goal 2 (Week 1): Pt will demonstrate rolling L in flat bed req min A without rails PT Short Term Goal 2 - Progress (Week 1): Met PT Short Term Goal 3 (Week 1): Pt will ambulate >= 50' with Eva walker req 1-2 person assist PT Short Term Goal 3 - Progress (Week 1): Met PT Short Term Goal 4 (Week 1): Pt will demonstrate appropriate sequencing with min cues for sit to stand req min A PT Short Term Goal 4 - Progress (Week 1): Met PT Short Term Goal 5 (Week 1): Pt will initiate stair training during PT session.  PT Short Term Goal 5 - Progress (Week 1): Met  Skilled Therapeutic Interventions/Progress Updates:  Pt was seen bedside in the pm. Pt propelled w/c to gym with S. In gym treatment focused on ambulation and stair training. Pt ambulated about 90 feet with R AFO, SBQC and min guard to S with verbal cues. Pt ascended/descended 12 stairs with L rail, utilizing backwards technique when descending stairs. Pt required min guard to S the first attempt and S only the second attempt. Pt feels more confident descending steps backwards with L rail. Pt propelled self back to room with S.    Therapy Documentation Precautions:  Precautions Precautions: Fall Precaution Comments: R flexor synergy in LE during gait; motor apraxia; R hemiplegia; R inattention Restrictions Weight Bearing Restrictions: No General:   Pain: No c/o pain.    See Function Navigator for Current Functional Status.   Therapy/Group: Individual Therapy  Dub Amis 06/23/2015, 3:48 PM

## 2015-06-23 NOTE — Procedures (Signed)
ELECTROENCEPHALOGRAM REPORT   Patient: Ashley [name]      Room #: 8P-77 Age: 39 y.o.        Sex: female Referring Physician: Dr Erlinda Hong Report Date:  06/23/2015        Interpreting Physician: Hulen Luster  History: Ashley [name] is an 39 y.o. female hx of ICH  Medications:    Conditions of Recording:  This is a 19 channel EEG carried out with the patient in the awake state.  Description:  The waking background activity consists of a low voltage, symmetrical, fairly well organized, 9-11 Hz alpha activity, seen from the parieto-occipital and posterior temporal regions. Intermittent focal slowing is noted in the left posterior region. No epileptiform discharges noted.   Hyperventilation was not performed. Intermittent photic stimulation was not performed   IMPRESSION: Abnormal EEG due to the presence of intermittent focal slowing localized in the left temporal/parietal region. No epileptiform activity noted.   Jim Like, DO [facility] [phone]  If 7pm- 7am, please page neurology on call as listed in [place]. 06/23/2015, 3:24 PM

## 2015-06-23 NOTE — Progress Notes (Signed)
Physical Therapy Session Note  Patient Details  Name: Ashley [name] MRN: [id number] Date of Birth: 04/27/1976  Today's Date: 06/23/2015 PT Individual Time: 1445-1530 PT Individual Time Calculation (min): 45 min   Short Term Goals: Week 1:  PT Short Term Goal 1 (Week 1): Pt will transfer w/c to/from bed req min A via stand-step transfer to pt's L. PT Short Term Goal 1 - Progress (Week 1): Met PT Short Term Goal 2 (Week 1): Pt will demonstrate rolling L in flat bed req min A without rails PT Short Term Goal 2 - Progress (Week 1): Met PT Short Term Goal 3 (Week 1): Pt will ambulate >= 50' with Eva walker req 1-2 person assist PT Short Term Goal 3 - Progress (Week 1): Met PT Short Term Goal 4 (Week 1): Pt will demonstrate appropriate sequencing with min cues for sit to stand req min A PT Short Term Goal 4 - Progress (Week 1): Met PT Short Term Goal 5 (Week 1): Pt will initiate stair training during PT session.  PT Short Term Goal 5 - Progress (Week 1): Met  Skilled Therapeutic Interventions/Progress Updates:  Pt was seen bedside in the pm. Pt propelled w/c to gym with S. Pt ambulated 200 feet x 2 with quad cane, R AFO and S to min guar, pt required occasional min guard with longer distances secondary to fatigue. Treatment in gym focused on NMR, pt reaching R UE to place horse shoes on basketball goal. Following treatment, pt returned to room, propelling w/c with S.    Therapy Documentation Precautions:  Precautions Precautions: Fall Precaution Comments: R flexor synergy in LE during gait; motor apraxia; R hemiplegia; R inattention Restrictions Weight Bearing Restrictions: No General:   Pain: No c/o pain.   See Function Navigator for Current Functional Status.   Therapy/Group: Individual Therapy  Dub Amis 06/23/2015, 4:14 PM

## 2015-06-23 NOTE — Consult Note (Signed)
Referring Physician: Dr. Read Drivers    Chief Complaint: worsening speech difficulty  HPI: Ashley [name] is an 39 y.o. female admitted on 05/21/15 for severe HA for several days and developed right hemiparesis and aphasia as well as right focal seizure. She was 2 weeks post partum at that time. She was found to have left parietal ICH with IVH, cerebral edema, obstructive hydrocephalus and SAH. MRA and MRV were unremarkable. Concerning for cerebral cortical vein thrombosis not seen on MRV, however, RVCS was also on differential. She was treated with 3% saline and was stabilized over time. She also had a seizure, EEG showed left PLEDs, was put on Keppra for seizure control. She was then discharged to CIR after stabilization.  After admitted to CIR, about one week ago, patient had seizure episode again, Keppra was increased to 1500 twice a day. Since then patient had no more seizure activity. She is working with PT OT and speech, had made great progress. She is able to walk with cane and making sentences and was scheduled to discharge next Wednesday. However, as per therapist, her language seems getting worse over the last 2 days, with more stuttering, and words finding difficulties, especially when patient more anxious and eager to speak. No decline of any arm leg weakness, no seizure activity. Neurology was consulted for further evaluation.   Today speech is much improved close to baseline.    Past Medical History  Diagnosis Date  . Anxiety   . ICH (intracerebral hemorrhage)     Past Surgical History  Procedure Laterality Date  . Dilation and curettage of uterus      Family History  Problem Relation Age of Onset  . Cancer Mother   . Cancer Father     pancreatic   Social History:  reports that she has never smoked. She has never used smokeless tobacco. She reports that she drinks alcohol. She reports that she does not use illicit drugs.  Allergies: No Known Allergies  Medications:  Prior to  Admission:  Prescriptions prior to admission  Medication Sig Dispense Refill Last Dose  . acetaminophen (TYLENOL) 500 MG tablet Take 1,000 mg by mouth every 6 (six) hours as needed for headache.   Unk  . acyclovir (ZOVIRAX) 400 MG tablet Take 400 mg by mouth 5 (five) times daily as needed (for breakout).    Unk  . docusate sodium (COLACE) 100 MG capsule Take 100 mg by mouth daily.   Unk  . FLUoxetine (PROZAC) 40 MG capsule Take 40 mg by mouth daily.   Unk  . ibuprofen (ADVIL,MOTRIN) 600 MG tablet Take 1 tablet (600 mg total) by mouth every 6 (six) hours as needed for moderate pain or cramping. 40 tablet 0 Unk  . levETIRAcetam (KEPPRA) 750 MG tablet Take 2 tablets (1,500 mg total) by mouth 2 (two) times daily.     Marland Kitchen sulfamethoxazole-trimethoprim (BACTRIM DS,SEPTRA DS) 800-160 MG per tablet Take 1 tablet by mouth every 12 (twelve) hours. Through 06/09/15, then stop     . traZODone (DESYREL) 50 MG tablet Take 1 tablet (50 mg total) by mouth at bedtime as needed for sleep.       ROS: ROS reviewed in 14 systems all negative except mentioned in HPI.  Physical Examination:  Temp:  [98.1 F (36.7 C)] 98.1 F (36.7 C) (09/10 0500) Pulse Rate:  [79-85] 79 (09/10 0500) Resp:  [16-18] 16 (09/10 0500) BP: (89-101)/(52-65) 89/52 mmHg (09/10 0500) SpO2:  [98 %-100 %] 100 % (09/10 0500)  General -  Well nourished, well developed, in no apparent distress.  Ophthalmologic - Sharp disc margins OU.  Cardiovascular - Regular rate and rhythm with no murmur.  Mental Status -  Level of arousal and orientation to time, place, and person were intact. Language exam showed fluent speech with occasional paraphasic errors, follow simple and complex commands, naming intact, repetition with occasional paraphasic errors. However, her hesitancy or paraphasic errors occurs most time in the setting of anxious and eager to speak.  Fund of Knowledge was assessed and was intact.  Cranial Nerves II - XII - II - Visual  field intact OU. III, IV, VI - Extraocular movements intact. V - Facial sensation intact bilaterally. VII - mild right nasolabial fold flattening. VIII - Hearing & vestibular intact bilaterally. X - Palate elevates symmetrically. XI - Chin turning & shoulder shrug intact bilaterally. XII - Tongue protrusion intact.  Motor Strength - The patient's strength was RUE 5/5 detoid, 5-/5 bicep and 4/5 tricep and 4/5 finger grip, RLE limited exam due to brace, but at least 4/5, LUE and LLE 5/5, and pronator drift was present on the right.  Bulk was normal and fasciculations were absent.   Motor Tone - Muscle tone was assessed at the neck and appendages and was normal.  Reflexes - The patient's reflexes were 1+ in all extremities and she had no pathological reflexes.  Sensory - Light touch, temperature/pinprick were assessed and were symmetrical.    Coordination - The patient had normal movements in the hands with no ataxia or dysmetria, but right UE significantly slow but no ataxia.  Tremor was absent.  Gait and Station - deferred to PT.   Results for orders placed or performed during the hospital encounter of 06/04/15 (from the past 48 hour(s))  Urinalysis, Routine w reflex microscopic (not at [facility])     Status: Abnormal   Collection Time: 06/22/15 11:04 AM  Result Value Ref Range   Color, Urine YELLOW YELLOW   APPearance CLEAR CLEAR   Specific Gravity, Urine 1.003 (L) 1.005 - 1.030   pH 7.0 5.0 - 8.0   Glucose, UA NEGATIVE NEGATIVE mg/dL   Hgb urine dipstick NEGATIVE NEGATIVE   Bilirubin Urine NEGATIVE NEGATIVE   Ketones, ur NEGATIVE NEGATIVE mg/dL   Protein, ur NEGATIVE NEGATIVE mg/dL   Urobilinogen, UA 0.2 0.0 - 1.0 mg/dL   Nitrite NEGATIVE NEGATIVE   Leukocytes, UA NEGATIVE NEGATIVE    Comment: MICROSCOPIC NOT DONE ON URINES WITH NEGATIVE PROTEIN, BLOOD, LEUKOCYTES, NITRITE, OR GLUCOSE <1000 mg/dL.  Urine culture     Status: None   Collection Time: 06/22/15 11:04 AM  Result Value  Ref Range   Specimen Description URINE, CLEAN CATCH    Special Requests NONE    Culture MULTIPLE SPECIES PRESENT, SUGGEST RECOLLECTION    Report Status 06/23/2015 FINAL   Basic metabolic panel     Status: Abnormal   Collection Time: 06/22/15 11:10 AM  Result Value Ref Range   Sodium 139 135 - 145 mmol/L   Potassium 3.6 3.5 - 5.1 mmol/L   Chloride 101 101 - 111 mmol/L   CO2 31 22 - 32 mmol/L   Glucose, Bld 68 65 - 99 mg/dL   BUN 5 (L) 6 - 20 mg/dL   Creatinine, Ser 0.55 0.44 - 1.00 mg/dL   Calcium 9.6 8.9 - 10.3 mg/dL   GFR calc non Af Amer >60 >60 mL/min   GFR calc Af Amer >60 >60 mL/min    Comment: (NOTE) The eGFR has been calculated  using the CKD EPI equation. This calculation has not been validated in all clinical situations. eGFR's persistently <60 mL/min signify possible Chronic Kidney Disease.    Anion gap 7 5 - 15  CBC     Status: None   Collection Time: 06/22/15 11:10 AM  Result Value Ref Range   WBC 7.0 4.0 - 10.5 K/uL   RBC 4.56 3.87 - 5.11 MIL/uL   Hemoglobin 13.5 12.0 - 15.0 g/dL   HCT 41.3 36.0 - 46.0 %   MCV 90.6 78.0 - 100.0 fL   MCH 29.6 26.0 - 34.0 pg   MCHC 32.7 30.0 - 36.0 g/dL   RDW 13.0 11.5 - 15.5 %   Platelets 259 150 - 400 K/uL   Mr Jeri Cos Wo Contrast  06/22/2015   IMPRESSION: 1. Acute gyral swelling and T2 hyperintensity involving the left hippocampal formation, left insula, and operculum. No associated restricted diffusion, enhancement, or mass effect. Favor these are postictal changes. 2. Fairly large intra-axial hemorrhage of the posterior superior left hemisphere which cross midline through the body of the corpus callosum re- demonstrated. Mild regression of blood products since August. Mild peripheral enhancement without suspicious features at this time. Regressed cerebral edema and regional mass effect. Recommend repeat brain MRI without and with contrast once the hematoma has resolved. 3. Residual left superior and lateral convexity subarachnoid  hemorrhage and/or superficial siderosis. Trace left vertex subdural hemorrhage suspected. Resolved intraventricular hemorrhage and no ventriculomegaly.     Assessment: 39 y.o. female was admitted on 05/21/2015 for left parietal ICH with IVH, cerebral edema, partial seizure, obstructive hydrocephalus and SAH. Etiology for ICH is not clear, possibly due to postpartum RVCS vs. CVT. No anticoagulation was given. She was discharged to CIR on 05/25/2015. She was making progress in CIR for the last 4 weeks. However started to have worsening language deficit with more staggering, word finding difficulties for the last 2 days. MRI repeated today showed resolving hematoma, but left hippocampal T2 hyperintensity suggesting post ictal changes. Her last seizure about one week ago, currently on Keppra 1500 twice a day, no seizure since then. Neuro exam showed occasional aphasic errors in the setting of anxiety. For the last 2 days, she had increased work load for therapy, increased exercises by herself, anxious to go home, had stress regarding the custody of her children. Her speech deficit most likely due to stress, anxiety, and fatigue.   Plan: - MRI showed resolving hematoma - MRI also showed left hippocampal T2 hyperintensity suggesting ?  post ictal changes - Pt will have EEG today and if no epileptiform activity would continue daily Keppra of 1500 BID - pt.ot  - d/c planning   Rob Mciver  06/23/2015 10:45 AM

## 2015-06-23 NOTE — Progress Notes (Signed)
EEG completed, results pending. 

## 2015-06-23 NOTE — Progress Notes (Signed)
Patient ID: Ashley [name], female   DOB: 25-Jan-1976, 39 y.o.   MRN: [id number]  06/23/15  Subjective/Complaints:  39 y/o WF admit for CIR with  functional deficits secondary to Right spastic hemiplegia,dysphagia, and aphasia secondary to large left parietal intracranial hemorrhage, also  cog def,  No new swallowing issues      No pain today  ROS denies CP, SOB, Abd pain, , bowel and bladder ok  Still notes memory issues Objective: Vital Signs: Blood pressure 89/52, pulse 79, temperature 98.1 F (36.7 C), temperature source Oral, resp. rate 16, weight 138 lb 14.4 oz (63.005 kg), SpO2 100 %, unknown if currently breastfeeding.  Results for orders placed or performed during the hospital encounter of 06/04/15 (from the past 72 hour(s))  Urinalysis, Routine w reflex microscopic (not at [facility])     Status: Abnormal   Collection Time: 06/22/15 11:04 AM  Result Value Ref Range   Color, Urine YELLOW YELLOW   APPearance CLEAR CLEAR   Specific Gravity, Urine 1.003 (L) 1.005 - 1.030   pH 7.0 5.0 - 8.0   Glucose, UA NEGATIVE NEGATIVE mg/dL   Hgb urine dipstick NEGATIVE NEGATIVE   Bilirubin Urine NEGATIVE NEGATIVE   Ketones, ur NEGATIVE NEGATIVE mg/dL   Protein, ur NEGATIVE NEGATIVE mg/dL   Urobilinogen, UA 0.2 0.0 - 1.0 mg/dL   Nitrite NEGATIVE NEGATIVE   Leukocytes, UA NEGATIVE NEGATIVE    Comment: MICROSCOPIC NOT DONE ON URINES WITH NEGATIVE PROTEIN, BLOOD, LEUKOCYTES, NITRITE, OR GLUCOSE <1000 mg/dL.  Urine culture     Status: None   Collection Time: 06/22/15 11:04 AM  Result Value Ref Range   Specimen Description URINE, CLEAN CATCH    Special Requests NONE    Culture MULTIPLE SPECIES PRESENT, SUGGEST RECOLLECTION    Report Status 06/23/2015 FINAL   Basic metabolic panel     Status: Abnormal   Collection Time: 06/22/15 11:10 AM  Result Value Ref Range   Sodium 139 135 - 145 mmol/L   Potassium 3.6 3.5 - 5.1 mmol/L   Chloride 101 101 - 111 mmol/L   CO2 31 22 - 32 mmol/L   Glucose, Bld 68  65 - 99 mg/dL   BUN 5 (L) 6 - 20 mg/dL   Creatinine, Ser 0.55 0.44 - 1.00 mg/dL   Calcium 9.6 8.9 - 10.3 mg/dL   GFR calc non Af Amer >60 >60 mL/min   GFR calc Af Amer >60 >60 mL/min    Comment: (NOTE) The eGFR has been calculated using the CKD EPI equation. This calculation has not been validated in all clinical situations. eGFR's persistently <60 mL/min signify possible Chronic Kidney Disease.    Anion gap 7 5 - 15  CBC     Status: None   Collection Time: 06/22/15 11:10 AM  Result Value Ref Range   WBC 7.0 4.0 - 10.5 K/uL   RBC 4.56 3.87 - 5.11 MIL/uL   Hemoglobin 13.5 12.0 - 15.0 g/dL   HCT 41.3 36.0 - 46.0 %   MCV 90.6 78.0 - 100.0 fL   MCH 29.6 26.0 - 34.0 pg   MCHC 32.7 30.0 - 36.0 g/dL   RDW 13.0 11.5 - 15.5 %   Platelets 259 150 - 400 K/uL     HEENT: normal Cardio: RRR and no murmur Resp: CTA B/L and unlabored GI: BS positive and NT, ND Extremity:  BS positive and NT, ND  Neuro: Alert/Oriented, R sided weakness  Musc/Skel:  no ankle swelling Gen NAD   Assessment/Plan: 1. Functional  deficits secondary to Right spastic hemiplegia,dysphagia, and aphasia secondary to large left parietal intracranial hemorrhage   which require 3+ hours per day of interdisciplinary therapy in a comprehensive inpatient rehab setting. Physiatrist is providing close team supervision and 24 hour management of active medical problems listed below. Physiatrist and rehab team continue to assess barriers to discharge/monitor patient progress toward functional and medical goals.  Some decreased recall/naming of a medication today, no clear cut signs of a new CVA, check for dehydration given low BP, check UA (no dysuria)  FIM:                       Medical Problem List and Plan: 1. Functional deficits secondary to Right spastic hemiplegia,dysphagia, and aphasia secondary to large left parietal intracranial hemorrhage, also  cog def,   2. DVT  Prophylaxis/Anticoagulation: Pharmaceutical: Lovenox 3.  Insomnia: improved on trazodone,  4. Post CVA seizures: with breakthrough now controlled on keppra 1530m bid,no further seizures since readmit, speech complaints may be related to Keppra dose 5.  Spasticity improved per pt ,   Baclofen to Qhs PRN LOS (Days) 19 A FACE TO FACE EVALUATION WAS PERFORMED  KNyoka Cowden9/07/2015, 8:19 AM   .akp

## 2015-06-24 ENCOUNTER — Inpatient Hospital Stay (HOSPITAL_COMMUNITY): Payer: BLUE CROSS/BLUE SHIELD

## 2015-06-24 NOTE — Progress Notes (Signed)
Occupational Therapy Session Note  Patient Details  Name: Deasha Clendenin MRN: [id number] Date of Birth: Jan 13, 1976  Today's Date: 06/24/2015 OT Individual Time: 1345-1415 OT Individual Time Calculation (min): 30 min    Short Term Goals: Week 3:  OT Short Term Goal 1 (Week 3): STG=LTG d/t short remaining LOS.  Skilled Therapeutic Interventions/Progress Updates: Therapeutic activity with focus on simple meal prep at ADL kitchen.   OT re-educates pt on home safety, fall risk, use of countertops and wheeled cart to transport items.   Pt prepares simple meal using quad-tipped cane to ambulate in kitchen with supervision as pt accesses freezer, microwave, and cabinets to prepare frozen pot pie meal.   Pt requires min vc for safety and problem-solving while pushing wheeled cart to table.   Pt reports frustration with her speech and is aware of risk of cooking full meals; per pt, she will not attempt cooking without assistance from her husband.     Therapy Documentation Precautions:  Precautions Precautions: Fall Precaution Comments: R flexor synergy in LE during gait; motor apraxia; R hemiplegia; R inattention Restrictions Weight Bearing Restrictions: No  Vital Signs: Therapy Vitals Temp: 98.2 F (36.8 C) Temp Source: Oral Pulse Rate: 84 Resp: 19 BP: (!) 101/55 mmHg Patient Position (if appropriate): Sitting Oxygen Therapy SpO2: 100 % O2 Device: Not Delivered   Pain: No/denies pain   ADL: ADL ADL Comments: see Functional Assessment  See Function Navigator for Current Functional Status.   Therapy/Group: Individual Therapy  [place] 06/24/2015, 3:50 PM

## 2015-06-24 NOTE — Progress Notes (Signed)
Patient ID: Ashley [name], female   DOB: 12-Apr-1976, 39 y.o.   MRN: [id number]   Patient ID: Ashley [name], female   DOB: 1976/08/02, 39 y.o.   MRN: [id number]  06/24/15  Subjective/Complaints:  39 y/o WF admit for CIR with  functional deficits secondary to Right spastic hemiplegia,dysphagia, and aphasia secondary to large left parietal intracranial hemorrhage, also  cog def, Stable; asking about EEG performed yesterday.     No pain today  ROS denies CP, SOB, Abd pain, , bowel and bladder ok  Still notes memory issues  Past Medical History  Diagnosis Date  . Anxiety   . ICH (intracerebral hemorrhage)     Objective: Vital Signs: Blood pressure 92/55, pulse 78, temperature 98.2 F (36.8 C), temperature source Oral, resp. rate 16, weight 138 lb 14.4 oz (63.005 kg), SpO2 98 %, unknown if currently breastfeeding.  Results for orders placed or performed during the hospital encounter of 06/04/15 (from the past 72 hour(s))  Urinalysis, Routine w reflex microscopic (not at [facility])     Status: Abnormal   Collection Time: 06/22/15 11:04 AM  Result Value Ref Range   Color, Urine YELLOW YELLOW   APPearance CLEAR CLEAR   Specific Gravity, Urine 1.003 (L) 1.005 - 1.030   pH 7.0 5.0 - 8.0   Glucose, UA NEGATIVE NEGATIVE mg/dL   Hgb urine dipstick NEGATIVE NEGATIVE   Bilirubin Urine NEGATIVE NEGATIVE   Ketones, ur NEGATIVE NEGATIVE mg/dL   Protein, ur NEGATIVE NEGATIVE mg/dL   Urobilinogen, UA 0.2 0.0 - 1.0 mg/dL   Nitrite NEGATIVE NEGATIVE   Leukocytes, UA NEGATIVE NEGATIVE    Comment: MICROSCOPIC NOT DONE ON URINES WITH NEGATIVE PROTEIN, BLOOD, LEUKOCYTES, NITRITE, OR GLUCOSE <1000 mg/dL.  Urine culture     Status: None   Collection Time: 06/22/15 11:04 AM  Result Value Ref Range   Specimen Description URINE, CLEAN CATCH    Special Requests NONE    Culture MULTIPLE SPECIES PRESENT, SUGGEST RECOLLECTION    Report Status 06/23/2015 FINAL   Basic metabolic panel     Status: Abnormal   Collection Time: 06/22/15 11:10 AM  Result Value Ref Range   Sodium 139 135 - 145 mmol/L   Potassium 3.6 3.5 - 5.1 mmol/L   Chloride 101 101 - 111 mmol/L   CO2 31 22 - 32 mmol/L   Glucose, Bld 68 65 - 99 mg/dL   BUN 5 (L) 6 - 20 mg/dL   Creatinine, Ser 0.55 0.44 - 1.00 mg/dL   Calcium 9.6 8.9 - 10.3 mg/dL   GFR calc non Af Amer >60 >60 mL/min   GFR calc Af Amer >60 >60 mL/min    Comment: (NOTE) The eGFR has been calculated using the CKD EPI equation. This calculation has not been validated in all clinical situations. eGFR's persistently <60 mL/min signify possible Chronic Kidney Disease.    Anion gap 7 5 - 15  CBC     Status: None   Collection Time: 06/22/15 11:10 AM  Result Value Ref Range   WBC 7.0 4.0 - 10.5 K/uL   RBC 4.56 3.87 - 5.11 MIL/uL   Hemoglobin 13.5 12.0 - 15.0 g/dL   HCT 41.3 36.0 - 46.0 %   MCV 90.6 78.0 - 100.0 fL   MCH 29.6 26.0 - 34.0 pg   MCHC 32.7 30.0 - 36.0 g/dL   RDW 13.0 11.5 - 15.5 %   Platelets 259 150 - 400 K/uL     HEENT: normal Cardio: RRR and no murmur Resp:  CTA B/L and unlabored GI: BS positive and NT, ND Extremity:  BS positive and NT, ND  Neuro: Alert/Oriented, R sided weakness normal speech Musc/Skel:  no ankle swelling Gen NAD    Medical Problem List and Plan: 1. Functional deficits secondary to Right spastic hemiplegia,dysphagia, and aphasia secondary to large left parietal intracranial hemorrhage   2. DVT Prophylaxis/Anticoagulation: Pharmaceutical: Lovenox 3.  Insomnia: improved on trazodone,  4. Post CVA seizures: with breakthrough now controlled on keppra 1534m bid,no further seizures since readmit, speech complaints may be related to Keppra dose;  EEG 9/10 reviewed and discussed with patient 5.  Spasticity improved per pt ,   Baclofen to Qhs PRN LOS (Days) 20 A FACE TO FACE EVALUATION WAS PERFORMED  KNyoka Cowden9/08/2015, 7:51 AM

## 2015-06-24 NOTE — Consult Note (Signed)
Referring Physician: Dr. Read Drivers    Chief Complaint: worsening speech difficulty  HPI: Ashley [name] is an 39 y.o. female admitted on 05/21/15 for severe HA for several days and developed right hemiparesis and aphasia as well as right focal seizure. She was 2 weeks post partum at that time. She was found to have left parietal ICH with IVH, cerebral edema, obstructive hydrocephalus and SAH. MRA and MRV were unremarkable. Concerning for cerebral cortical vein thrombosis not seen on MRV, however, RVCS was also on differential. She was treated with 3% saline and was stabilized over time. She also had a seizure, EEG showed left PLEDs, was put on Keppra for seizure control. She was then discharged to CIR after stabilization.  After admitted to CIR, about one week ago, patient had seizure episode again, Keppra was increased to 1500 twice a day. Since then patient had no more seizure activity. She is working with PT OT and speech, had made great progress. She is able to walk with cane and making sentences and was scheduled to discharge next Wednesday. However, as per therapist, her language seems getting worse over the last 2 days, with more stuttering, and words finding difficulties, especially when patient more anxious and eager to speak. No decline of any arm leg weakness, no seizure activity. Neurology was consulted for further evaluation.   Today speech is much improved close to baseline.    Past Medical History  Diagnosis Date  . Anxiety   . ICH (intracerebral hemorrhage)     Past Surgical History  Procedure Laterality Date  . Dilation and curettage of uterus      Family History  Problem Relation Age of Onset  . Cancer Mother   . Cancer Father     pancreatic   Social History:  reports that she has never smoked. She has never used smokeless tobacco. She reports that she drinks alcohol. She reports that she does not use illicit drugs.  Allergies: No Known Allergies  Medications:  Prior to  Admission:  Prescriptions prior to admission  Medication Sig Dispense Refill Last Dose  . acetaminophen (TYLENOL) 500 MG tablet Take 1,000 mg by mouth every 6 (six) hours as needed for headache.   Unk  . acyclovir (ZOVIRAX) 400 MG tablet Take 400 mg by mouth 5 (five) times daily as needed (for breakout).    Unk  . docusate sodium (COLACE) 100 MG capsule Take 100 mg by mouth daily.   Unk  . FLUoxetine (PROZAC) 40 MG capsule Take 40 mg by mouth daily.   Unk  . ibuprofen (ADVIL,MOTRIN) 600 MG tablet Take 1 tablet (600 mg total) by mouth every 6 (six) hours as needed for moderate pain or cramping. 40 tablet 0 Unk  . levETIRAcetam (KEPPRA) 750 MG tablet Take 2 tablets (1,500 mg total) by mouth 2 (two) times daily.     Marland Kitchen sulfamethoxazole-trimethoprim (BACTRIM DS,SEPTRA DS) 800-160 MG per tablet Take 1 tablet by mouth every 12 (twelve) hours. Through 06/09/15, then stop     . traZODone (DESYREL) 50 MG tablet Take 1 tablet (50 mg total) by mouth at bedtime as needed for sleep.       ROS: ROS reviewed in 14 systems all negative except mentioned in HPI.  Physical Examination:  Temp:  [98.2 F (36.8 C)] 98.2 F (36.8 C) (09/11 0645) Pulse Rate:  [77-78] 78 (09/11 0645) Resp:  [16] 16 (09/11 0645) BP: (92-96)/(55-63) 92/55 mmHg (09/11 0645) SpO2:  [98 %-100 %] 98 % (09/11 0645)  General -  Well nourished, well developed, in no apparent distress.  Ophthalmologic - Sharp disc margins OU.  Cardiovascular - Regular rate and rhythm with no murmur.  Mental Status -  Level of arousal and orientation to time, place, and person were intact. Language exam showed fluent speech with occasional paraphasic errors, follow simple and complex commands, naming intact, repetition with occasional paraphasic errors. However, her hesitancy or paraphasic errors occurs most time in the setting of anxious and eager to speak.  Fund of Knowledge was assessed and was intact.  Cranial Nerves II - XII - II - Visual field  intact OU. III, IV, VI - Extraocular movements intact. V - Facial sensation intact bilaterally. VII - mild right nasolabial fold flattening. VIII - Hearing & vestibular intact bilaterally. X - Palate elevates symmetrically. XI - Chin turning & shoulder shrug intact bilaterally. XII - Tongue protrusion intact.  Motor Strength - The patient's strength was RUE 5/5 detoid, 5-/5 bicep and 4/5 tricep and 4/5 finger grip, RLE limited exam due to brace, but at least 4/5, LUE and LLE 5/5, and pronator drift was present on the right.  Bulk was normal and fasciculations were absent.   Motor Tone - Muscle tone was assessed at the neck and appendages and was normal.  Reflexes - The patient's reflexes were 1+ in all extremities and she had no pathological reflexes.  Sensory - Light touch, temperature/pinprick were assessed and were symmetrical.    Coordination - The patient had normal movements in the hands with no ataxia or dysmetria, but right UE significantly slow but no ataxia.  Tremor was absent.  Gait and Station - deferred to PT.   No results found for this or any previous visit (from the past 48 hour(s)). Mr Jeri Cos Wo Contrast  06/22/2015   IMPRESSION: 1. Acute gyral swelling and T2 hyperintensity involving the left hippocampal formation, left insula, and operculum. No associated restricted diffusion, enhancement, or mass effect. Favor these are postictal changes. 2. Fairly large intra-axial hemorrhage of the posterior superior left hemisphere which cross midline through the body of the corpus callosum re- demonstrated. Mild regression of blood products since August. Mild peripheral enhancement without suspicious features at this time. Regressed cerebral edema and regional mass effect. Recommend repeat brain MRI without and with contrast once the hematoma has resolved. 3. Residual left superior and lateral convexity subarachnoid hemorrhage and/or superficial siderosis. Trace left vertex subdural  hemorrhage suspected. Resolved intraventricular hemorrhage and no ventriculomegaly.     Assessment: 39 y.o. female was admitted on 05/21/2015 for left parietal ICH with IVH, cerebral edema, partial seizure, obstructive hydrocephalus and SAH. Etiology for ICH is not clear, possibly due to postpartum RVCS vs. CVT. No anticoagulation was given. She was discharged to CIR on 05/25/2015. She was making progress in CIR for the last 4 weeks. However started to have worsening language deficit with more staggering, word finding difficulties for the last 2 days. MRI repeated today showed resolving hematoma, but left hippocampal T2 hyperintensity suggesting post ictal changes. Her last seizure about one week ago, currently on Keppra 1500 twice a day, no seizure since then. Neuro exam showed occasional aphasic errors in the setting of anxiety. For the last 2 days, she had increased work load for therapy, increased exercises by herself, anxious to go home, had stress regarding the custody of her children. Her speech deficit most likely due to stress, anxiety, and fatigue.   Plan: Speech back to baseline EEG no epileptiform activity D/c rehab tomorrow No change  in anti epileptic medications.  Leotis Pain   06/24/2015 12:02 PM

## 2015-06-25 ENCOUNTER — Inpatient Hospital Stay (HOSPITAL_COMMUNITY): Payer: BLUE CROSS/BLUE SHIELD

## 2015-06-25 ENCOUNTER — Inpatient Hospital Stay (HOSPITAL_COMMUNITY): Payer: BLUE CROSS/BLUE SHIELD | Admitting: Physical Therapy

## 2015-06-25 ENCOUNTER — Inpatient Hospital Stay (HOSPITAL_COMMUNITY): Payer: BLUE CROSS/BLUE SHIELD | Admitting: Speech Pathology

## 2015-06-25 DIAGNOSIS — I611 Nontraumatic intracerebral hemorrhage in hemisphere, cortical: Secondary | ICD-10-CM

## 2015-06-25 NOTE — Progress Notes (Signed)
STROKE TEAM PROGRESS NOTE   HPI:  Ashley [name] is an 39 y.o. female admitted on 05/21/15 for severe HA for several days and developed right hemiparesis and aphasia as well as right focal seizure. She was 2 weeks post partum at that time. She was found to have left parietal ICH with IVH, cerebral edema, obstructive hydrocephalus and SAH. MRA and MRV were unremarkable. Concerning for cerebral cortical vein thrombosis not seen on MRV, however, RVCS was also on differential. She was treated with 3% saline and was stabilized over time. She also had a seizure, EEG showed left PLEDs, was put on Keppra for seizure control. She was then discharged to CIR after stabilization.  After admitted to CIR, about one week ago, patient had seizure episode again, Keppra was increased to 1500 twice a day. Since then patient had no more seizure activity. She is working with PT OT and speech, had made great progress. She is able to walk with cane and making sentences and was scheduled to discharge next Wednesday. However, as per therapist, her language seems getting worse over the Thursday and Friday, with more stuttering, and words finding difficulties, especially when patient more anxious and eager to speak. No decline of any arm leg weakness, no seizure activity. Neurology was consulted for further evaluation.  SUBJECTIVE (INTERVAL HISTORY) Her family is at the bedside.  Overall she feels her condition is rapidly improving. She stated that her speech is much better, only felt some problem if she speaks too fast. If slow down, she feels good.   OBJECTIVE Temp:  [97.9 F (36.6 C)-98.2 F (36.8 C)] 97.9 F (36.6 C) (09/12 0645) Pulse Rate:  [79-84] 79 (09/12 0645) Cardiac Rhythm:  [-]  Resp:  [17-19] 17 (09/12 0645) BP: (94-101)/(51-55) 94/51 mmHg (09/12 0645) SpO2:  [98 %-100 %] 98 % (09/12 0645)  No results for input(s): GLUCAP in the last 168 hours.  Recent Labs Lab 06/22/15 1110  NA 139  K 3.6  CL 101  CO2  31  GLUCOSE 68  BUN 5*  CREATININE 0.55  CALCIUM 9.6   No results for input(s): AST, ALT, ALKPHOS, BILITOT, PROT, ALBUMIN in the last 168 hours.  Recent Labs Lab 06/22/15 1110  WBC 7.0  HGB 13.5  HCT 41.3  MCV 90.6  PLT 259   No results for input(s): CKTOTAL, CKMB, CKMBINDEX, TROPONINI in the last 168 hours. No results for input(s): LABPROT, INR in the last 72 hours. No results for input(s): COLORURINE, LABSPEC, [place], GLUCOSEU, HGBUR, BILIRUBINUR, KETONESUR, PROTEINUR, UROBILINOGEN, NITRITE, LEUKOCYTESUR in the last 72 hours.  Invalid input(s): APPERANCEUR  No results found for: CHOL, TRIG, HDL, CHOLHDL, VLDL, LDLCALC No results found for: HGBA1C No results found for: LABOPIA, COCAINSCRNUR, LABBENZ, AMPHETMU, THCU, LABBARB  No results for input(s): ETH in the last 168 hours.  I have personally reviewed the radiological images below and agree with the radiology interpretations.  Ct Head Wo Contrast  06/02/2015    IMPRESSION: Persistent but somewhat resolving hemorrhage in the left frontoparietal region. No new focal abnormality is seen.    Mr Jeri Cos Wo Contrast  06/22/2015    IMPRESSION: 1. Acute gyral swelling and T2 hyperintensity involving the left hippocampal formation, left insula, and operculum. No associated restricted diffusion, enhancement, or mass effect. Favor these are postictal changes. 2. Fairly large intra-axial hemorrhage of the posterior superior left hemisphere which cross midline through the body of the corpus callosum re- demonstrated. Mild regression of blood products since August. Mild peripheral enhancement  without suspicious features at this time. Regressed cerebral edema and regional mass effect. Recommend repeat brain MRI without and with contrast once the hematoma has resolved. 3. Residual left superior and lateral convexity subarachnoid hemorrhage and/or superficial siderosis. Trace left vertex subdural hemorrhage suspected. Resolved intraventricular  hemorrhage and no ventriculomegaly.     Dg Chest Port 1 View  06/02/2015   IMPRESSION: No active disease.     EEG  Abnormal EEG due to the presence of intermittent focal slowing localized in the left temporal/parietal region. No epileptiform activity noted.   PHYSICAL EXAM  Temp:  [97.9 F (36.6 C)-98.2 F (36.8 C)] 97.9 F (36.6 C) (09/12 0645) Pulse Rate:  [79-84] 79 (09/12 0645) Resp:  [17-19] 17 (09/12 0645) BP: (94-101)/(51-55) 94/51 mmHg (09/12 0645) SpO2:  [98 %-100 %] 98 % (09/12 0645)  General - Well nourished, well developed, in no apparent distress.  Ophthalmologic - Sharp disc margins OU.  Cardiovascular - Regular rate and rhythm with no murmur.  Mental Status -  Level of arousal and orientation to time, place, and person were intact. Language exam showed fluent speech with occasional paraphasic errors, follow simple and complex commands, naming intact, repetition with occasional paraphasic errors. However, her hesitancy or paraphasic errors occurs most time in the setting of anxious and eager to speak.  Fund of Knowledge was assessed and was intact.  Cranial Nerves II - XII - II - Visual field intact OU. III, IV, VI - Extraocular movements intact. V - Facial sensation intact bilaterally. VII - mild right nasolabial fold flattening. VIII - Hearing & vestibular intact bilaterally. X - Palate elevates symmetrically. XI - Chin turning & shoulder shrug intact bilaterally. XII - Tongue protrusion intact.  Motor Strength - The patient's strength was RUE 5/5 detoid, 5-/5 bicep and 4/5 tricep and 4/5 finger grip, RLE limited exam due to brace, but at least 4/5, LUE and LLE 5/5, and pronator drift was present on the right. Bulk was normal and fasciculations were absent.  Motor Tone - Muscle tone was assessed at the neck and appendages and was normal.  Reflexes - The patient's reflexes were 1+ in all extremities and she had no pathological reflexes.  Sensory -  Light touch, temperature/pinprick were assessed and were symmetrical.   Coordination - The patient had normal movements in the hands with no ataxia or dysmetria, but right UE significantly slow but no ataxia. Tremor was absent.  Gait and Station - deferred to PT.   ASSESSMENT/PLAN 39 y.o. female was admitted on 05/21/2015 for left parietal ICH with IVH, cerebral edema, partial seizure, obstructive hydrocephalus and SAH. Etiology for ICH is not clear, possibly due to postpartum RVCS vs. CVT. No anticoagulation was given. She was discharged to CIR on 05/25/2015. She was making progress in CIR for the last 4 weeks. However started to have worsening language deficit with more staggering, word finding difficulties during rehab. MRI repeated showed resolving hematoma, but left hippocampal T2 hyperintensity suggesting post ictal changes. Her last seizure about one week ago, currently on Keppra 1500 twice a day, no seizure since then. Neuro exam showed occasional aphasic errors in the setting of anxiety. She had increased work load for therapy, increased exercises by herself, anxious to go home, had stress regarding the custody of her children. Her speech deficit most likely due to stress, anxiety, and fatigue. Now her speech much better after adjusting her general condition and repeat EEG no seizure, will continue current keppra. Follow up with Dr. Leonie Man as  scheduled.  Worsening speech difficulty  Likely due to stress, anxiety and fatigue  Much improved over weekend  No new acute changes on MRI  Continue PT/OT/speech  Seizure  Repeat EEG no seizure  Pt doing well on keppra without side effect  Continue keppra 1500mg  bid  Follow up with Dr. Leonie Man in clinic  Left parietal [place] with IVH, cerebral edema and SAH, possibly due to postpartum RVCS vs. CVT.   Resultant right hemiparesis, partial seizures  MRI stable and resolving left parietal ICH, IVH and SAH. Right hippocampal T2  hyperintensity suspect for postictal changes  MRA Unremarkable  MRV Unremarkable   Lovenox for VTE prophylaxis  DIET DYS 3 Room service appropriate?: Yes; Fluid consistency:: Nectar Thick  no antithrombotic at this time due to [place]  Disposition: expecting to be d/c this week  Follow up with Dr. Leonie Man in one month (two months from initial event as requested in chart)  Other Active Problems  Anxiety on prozac  Neurology will sign off. Please call with questions. Pt will follow up with Dr. Leonie Man at [facility] as scheduled. Thanks for the consult.   Rosalin Hawking, MD PhD Stroke Neurology 06/25/2015 2:03 PM    To contact Stroke Continuity provider, please refer to [email or web address] After hours, contact General Neurology

## 2015-06-25 NOTE — Progress Notes (Signed)
Speech Language Pathology Daily Session Note  Patient Details  Name: Ashley [name] MRN: [id number] Date of Birth: [birth year]  Today's Date: 06/25/2015 SLP Individual Time: 7425-9563 SLP Individual Time Calculation (min): 56 min  Short Term Goals: Week 3: SLP Short Term Goal 1 (Week 3): Pt will follow abstract, multi-step commands during functional tasks with mod I in 75% of observable opportunities.  SLP Short Term Goal 2 (Week 3): Pt will return demonstration of at least 2 safety precautions during functional tasks with mod I.  SLP Short Term Goal 3 (Week 3): Pt will recognize and correct errors in the moment during semi-complex self care and/or home management tasks with supervision.  SLP Short Term Goal 4 (Week 3): Pt will utilize compensatory memory aids to facilitate recall of new information in >75% of observable opportunities with supervision.  SLP Short Term Goal 5 (Week 3): Pt will utliize compensatory strategies for word finding to convey abstract, complex needs and/or wants with mod I.   SLP Short Term Goal 6 (Week 3): Pt will plan and execute a problem solving strategy during semi-complex self care and/or home management tasks with mod I.    Skilled Therapeutic Interventions:  Pt was seen for skilled ST targeting cognitive-linguistic goals.  Upon arrival, pt was seated upright in wheelchair, awake, alert, and agreeable to participate in [place].  Pt with noticeably improved functional communication in comparison to Friday's (9/9) treatment session; however, pt continues to present with phonemic paraphasic errors and dysfluent speech patterns which had not been present since early in her readmission to rehab.  SLP facilitated the session with a barrier activity to revisit use of compensatory communication strategies.  Pt did not recall strategies from previously targeted sessions; therefore, SLP provided skilled re-education emphasizing use of description to compensate for word finding  difficulties and coordinating onset of phonation with respiration to facilitate improved fluency of speech.  Pt utilized compensatory strategies during the abovementioned task with supervision.  She also required supervision for awareness of verbal errors.  Pt was returned to room and left upright in wheelchair with all needs left within reach.  Continue per current plan of care.    Function:  Eating Eating              Cognition Comprehension Comprehension assist level: Follows complex conversation/direction with extra time/assistive device  Expression   Expression assist level: Expresses basic 75 - 89% of the time/requires cueing 10 - 24% of the time. Needs helper to occlude trach/needs to repeat words.  Social Interaction Social Interaction assist level: Interacts appropriately with others with medication or extra time (anti-anxiety, antidepressant).  Problem Solving Problem solving assist level: Solves basic 90% of the time/requires cueing < 10% of the time  Memory Memory assist level: Recognizes or recalls 75 - 89% of the time/requires cueing 10 - 24% of the time    Pain Pain Assessment Pain Assessment: No/denies pain  Therapy/Group: Individual Therapy  Danai Gotto, Selinda Orion 06/25/2015, 3:56 PM

## 2015-06-25 NOTE — Progress Notes (Signed)
Physical Therapy Session Note  Patient Details  Name: Ashley [name] MRN: [id number] Date of Birth: Oct 10, 1976  Today's Date: 06/25/2015 PT Individual Time: 1102-1202 PT Individual Time Calculation (min): 60 min   Short Term Goals: Week 3:  PT Short Term Goal 1 (Week 3): = LTG of supervision-min A overall for D/C 9/14  Skilled Therapeutic Interventions/Progress Updates:   Pt received in her w/c; husband rescheduled business trip but not present this session.  Discussed with pt having husband come in tomorrow to finalize education with therapy.  Pt to call husband and end of session and confirm times.  Pt reporting speech returning to baseline but still notices difficulty with anxious.  Performed gait to gym with quad cane and initially supervision but pt continues to present with intermittent LOB, decreased foot clearance on R and decreased attention to RLE placement requiring min A overall for safety; will downgraded goal back to min A and if pt's mother not able to provide min A at home, will recommend that pt be w/c level with mother present.    Performed re-assessment of BERG with results below; pt improved score but still at increased risk for falls.  Discussed falls risk and safety with pt.  Performed NMR; see below for details.  Returned to room with quad cane and min A with verbal and tactile cues for trunk, weight shifting and upright gaze.  Pt left in w/c with friend and baby present.   Therapy Documentation Precautions:  Precautions Precautions: Fall Precaution Comments: R flexor synergy in LE during gait; motor apraxia; R hemiplegia; R inattention Restrictions Weight Bearing Restrictions: No Pain:  No c/o pain  Balance: Standardized Balance Assessment Standardized Balance Assessment: Berg Balance Test Berg Balance Test Sit to Stand: Able to stand  independently using hands Standing Unsupported: Able to stand 2 minutes with supervision Sitting with Back Unsupported but Feet  Supported on Floor or Stool: Able to sit safely and securely 2 minutes Stand to Sit: Controls descent by using hands Transfers: Able to transfer safely, definite need of hands Standing Unsupported with Eyes Closed: Able to stand 10 seconds with supervision Standing Ubsupported with Feet Together: Able to place feet together independently and stand for 1 minute with supervision From Standing, Reach Forward with Outstretched Arm: Can reach forward >12 cm safely (5") From Standing Position, Pick up Object from Floor: Able to pick up shoe, needs supervision From Standing Position, Turn to Look Behind Over each Shoulder: Looks behind from both sides and weight shifts well Turn 360 Degrees: Needs assistance while turning Standing Unsupported, Alternately Place Feet on Step/Stool: Able to complete >2 steps/needs minimal assist Standing Unsupported, One Foot in Front: Able to take small step independently and hold 30 seconds Standing on One Leg: Able to lift leg independently and hold equal to or more than 3 seconds Total Score: 37 Patient demonstrates increased fall risk as noted by score of 37/56 on Berg Balance Scale.  (<36= high risk for falls, close to 100%; 37-45 significant >80%; 46-51 moderate >50%; 52-55 lower >25%) Other Treatments: Treatments Neuromuscular Facilitation: Right;Lower Extremity;Forced use;Activity to increase motor control;Activity to increase timing and sequencing;Activity to increase grading;Activity to increase sustained activation;Activity to increase lateral weight shifting;Activity to increase anterior-posterior weight shifting during sit <> stand from elevated mat with LLE extended on top of bench to facilitate increased weight shift to R and activation of R during sit > stand and in static standing for stabilization and balance; required mod A overall during sit <> stand  for sequencing, trunk position and for full forward weight shift over BOS and cues for activation of R hip  to maintain COG in midline.     See Function Navigator for Current Functional Status.   Therapy/Group: Individual Therapy  Ashley [name] [facility] 06/25/2015, 12:50 PM

## 2015-06-25 NOTE — Progress Notes (Signed)
Subjective/Complaints:  Appreciate neurology note. Reviewed MRI, reviewed EEG report. Discussed results with patient and her husband. Still with occasional word finding issues.  ROS denies CP, SOB, Abd pain, , bowel and bladder ok   , still notes memory issues Objective: Vital Signs: Blood pressure 94/51, pulse 79, temperature 97.9 F (36.6 C), temperature source Oral, resp. rate 17, weight 63.005 kg (138 lb 14.4 oz), SpO2 98 %, unknown if currently breastfeeding. No results found. Results for orders placed or performed during the hospital encounter of 06/04/15 (from the past 72 hour(s))  Urinalysis, Routine w reflex microscopic (not at [facility])     Status: Abnormal   Collection Time: 06/22/15 11:04 AM  Result Value Ref Range   Color, Urine YELLOW YELLOW   APPearance CLEAR CLEAR   Specific Gravity, Urine 1.003 (L) 1.005 - 1.030   pH 7.0 5.0 - 8.0   Glucose, UA NEGATIVE NEGATIVE mg/dL   Hgb urine dipstick NEGATIVE NEGATIVE   Bilirubin Urine NEGATIVE NEGATIVE   Ketones, ur NEGATIVE NEGATIVE mg/dL   Protein, ur NEGATIVE NEGATIVE mg/dL   Urobilinogen, UA 0.2 0.0 - 1.0 mg/dL   Nitrite NEGATIVE NEGATIVE   Leukocytes, UA NEGATIVE NEGATIVE    Comment: MICROSCOPIC NOT DONE ON URINES WITH NEGATIVE PROTEIN, BLOOD, LEUKOCYTES, NITRITE, OR GLUCOSE <1000 mg/dL.  Urine culture     Status: None   Collection Time: 06/22/15 11:04 AM  Result Value Ref Range   Specimen Description URINE, CLEAN CATCH    Special Requests NONE    Culture MULTIPLE SPECIES PRESENT, SUGGEST RECOLLECTION    Report Status 06/23/2015 FINAL   Basic metabolic panel     Status: Abnormal   Collection Time: 06/22/15 11:10 AM  Result Value Ref Range   Sodium 139 135 - 145 mmol/L   Potassium 3.6 3.5 - 5.1 mmol/L   Chloride 101 101 - 111 mmol/L   CO2 31 22 - 32 mmol/L   Glucose, Bld 68 65 - 99 mg/dL   BUN 5 (L) 6 - 20 mg/dL   Creatinine, Ser 0.55 0.44 - 1.00 mg/dL   Calcium 9.6 8.9 - 10.3 mg/dL   GFR calc non Af Amer >60  >60 mL/min   GFR calc Af Amer >60 >60 mL/min    Comment: (NOTE) The eGFR has been calculated using the CKD EPI equation. This calculation has not been validated in all clinical situations. eGFR's persistently <60 mL/min signify possible Chronic Kidney Disease.    Anion gap 7 5 - 15  CBC     Status: None   Collection Time: 06/22/15 11:10 AM  Result Value Ref Range   WBC 7.0 4.0 - 10.5 K/uL   RBC 4.56 3.87 - 5.11 MIL/uL   Hemoglobin 13.5 12.0 - 15.0 g/dL   HCT 41.3 36.0 - 46.0 %   MCV 90.6 78.0 - 100.0 fL   MCH 29.6 26.0 - 34.0 pg   MCHC 32.7 30.0 - 36.0 g/dL   RDW 13.0 11.5 - 15.5 %   Platelets 259 150 - 400 K/uL     HEENT: normal Cardio: RRR and no murmur Resp: CTA B/L and unlabored GI: BS positive and NT, ND Extremity:  BS positive and NT, ND Skin:   Central line site healed over, bruising Left side neck Neuro: Alert/Oriented, Abnormal Sensory reduced sensation Right side in LE and right thumb , Left side 5/5, Abnormal Motor 3-/5 RUE grip, 3- bi and tri ,3- at delt, 3- hip/knee ext RLE and Expressive Aphasic, [place] 1- 2  at biceps femoris, 3 at ankle PF with tight heel cord,  Musc/Skel:  Other no pain with UE or LE ROM, mildly inverted at ankle, no ankle swelling Gen NAD   Assessment/Plan: 1. Functional deficits secondary to Right spastic hemiplegia,dysphagia, and aphasia secondary to large left parietal intracranial hemorrhage   which require 3+ hours per day of interdisciplinary therapy in a comprehensive inpatient rehab setting. Physiatrist is providing close team supervision and 24 hour management of active medical problems listed below. Physiatrist and rehab team continue to assess barriers to discharge/monitor patient progress toward functional and medical goals. We discussed that the patient still has a aphasia however some of the worsening that she has occurs with anxiety. We discussed possibly increasing on the Prozac however at this time we will continue the  current dose and monitor. FIM: Function - Bathing Position: Shower Body parts bathed by patient: Right arm, Left arm, Chest, Abdomen, Front perineal area, Buttocks, Right upper leg, Left upper leg, Right lower leg, Left lower leg Body parts bathed by helper: Back Bathing not applicable: Right arm, Left arm, Chest, Abdomen, Front perineal area, Buttocks, Right upper leg, Left upper leg, Right lower leg, Left lower leg Assist Level: Touching or steadying assistance(Pt > 75%) Set up : To adjust water temperature  Function- Upper Body Dressing/Undressing What is the patient wearing?: Bra, Pull over shirt/dress Bra - Perfomed by patient: Thread/unthread right bra strap, Thread/unthread left bra strap, Hook/unhook bra (pull down sports bra) Bra - Perfomed by helper: Hook/unhook bra (pull down sports bra) Pull over shirt/dress - Perfomed by patient: Thread/unthread right sleeve, Thread/unthread left sleeve, Put head through opening, Pull shirt over trunk Pull over shirt/dress - Perfomed by helper: Pull shirt over trunk Assist Level: Set up Set up : To obtain clothing/put away Function - Lower Body Dressing/Undressing What is the patient wearing?: Underwear, Pants, Non-skid slipper socks Position: Standing at sink Underwear - Performed by patient: Thread/unthread right underwear leg, Thread/unthread left underwear leg, Pull underwear up/down Underwear - Performed by helper: Pull underwear up/down Pants- Performed by patient: Thread/unthread right pants leg, Thread/unthread left pants leg, Pull pants up/down, Fasten/unfasten pants Pants- Performed by helper: Pull pants up/down Non-skid slipper socks- Performed by patient: Don/doff right sock, Don/doff left sock Socks - Performed by patient: Don/doff right sock, Don/doff left sock Shoes - Performed by patient: Don/doff left shoe Shoes - Performed by helper: Fasten right AFO - Performed by helper: Don/doff right AFO Assist Level: Touching or  steadying assistance (Pt > 75%) Set up : To obtain clothing/put away  Function - Toileting Toileting activity did not occur: Safety/medical concerns Toileting steps completed by patient: Adjust clothing prior to toileting, Performs perineal hygiene, Adjust clothing after toileting Toileting steps completed by helper: Adjust clothing prior to toileting, Performs perineal hygiene, Adjust clothing after toileting Toileting Assistive Devices: Grab bar or rail Assist level: Supervision or verbal cues  Function - Air cabin crew transfer activity did not occur: Refused Toilet transfer assistive device: Elevated toilet seat/BSC over toilet Mechanical lift: Stedy Assist level to toilet: Touching or steadying assistance (Pt > 75%) Assist level from toilet: Touching or steadying assistance (Pt > 75%) Assist level to bedside commode (at bedside): 2 helpers Assist level from bedside commode (at bedside): Moderate assist (Pt 50 - 74%/lift or lower)  Function - Chair/bed transfer Chair/bed transfer method: Ambulatory Chair/bed transfer assist level: Supervision or verbal cues Chair/bed transfer assistive device: Armrests, Walker, Orthosis Chair/bed transfer details: Verbal cues for precautions/safety  Function - Locomotion:  Wheelchair Will patient use wheelchair at discharge?: Yes Type: Manual Max wheelchair distance: 150 Assist Level: Supervision or verbal cues Assist Level: Supervision or verbal cues Assist Level: Supervision or verbal cues Function - Locomotion: Ambulation Assistive device: Cane-quad Max distance: 200 Assist level: Touching or steadying assistance (Pt > 75%) Assist level: Supervision or verbal cues Walk 50 feet with 2 turns activity did not occur: Safety/medical concerns Assist level: Supervision or verbal cues Walk 150 feet activity did not occur: Safety/medical concerns Assist level: Touching or steadying assistance (Pt > 75%) Walk 10 feet on uneven surfaces  activity did not occur: Safety/medical concerns  Function - Comprehension Comprehension: Auditory Comprehension assist level: Follows complex conversation/direction with extra time/assistive device  Function - Expression Expression: Verbal Expression assist level: Expresses basic 75 - 89% of the time/requires cueing 10 - 24% of the time. Needs helper to occlude trach/needs to repeat words.  Function - Social Interaction Social Interaction assist level: Interacts appropriately with others with medication or extra time (anti-anxiety, antidepressant).  Function - Problem Solving Problem solving assist level: Solves basic 90% of the time/requires cueing < 10% of the time  Function - Memory Memory assist level: Recognizes or recalls 75 - 89% of the time/requires cueing 10 - 24% of the time Patient normally able to recall (first 3 days only): That he or she is in a hospital, Location of own room, Current season  Medical Problem List and Plan: 1. Functional deficits secondary to Right spastic hemiplegia,dysphagia, and aphasia secondary to large left parietal intracranial hemorrhage, also  cog def,  -repeat MRI shows post ictal changes, likely from prior seizure  -EEG showed no new seizure activity, focal slowing as expected left parietal occipital  temporal 2. DVT Prophylaxis/Anticoagulation: Pharmaceutical: Lovenox 3. Pain Management: Tylenol prn 4. Mood: LCSW to follow for evaluation and support as cognition/aphasia improves.  5. Neuropsych: This patient is not capable of making decisions on her own behalf. 6. Skin/Wound Care: Routine pressure relief measures.  7. Fluids/Electrolytes/Nutrition: Monitor I/O. Normal fluid and caloric intake       8. Insomnia: improved on trazodone,  9. Post CVA seizures: with breakthrough now controlled on keppra 1571m bid,no further seizures since readmit,  10. Hx anxiety , back on prozac 151m-appears under control, this may increase dose will hold  off for now 11.  Ankle contracture- heel cord, Cont night time PRAFO 12.  Spasticity improved per pt ,   Baclofen to D/C LOS (Days) 21 A FACE TO FACE EVALUATION WAS PERFORMED  Tod Abrahamsen E 06/25/2015, 9:09 AM   .akp

## 2015-06-25 NOTE — Progress Notes (Signed)
Occupational Therapy Session Note  Patient Details  Name: Ashley [name] MRN: [id number] Date of Birth: March 17, 1976  Today's Date: 06/25/2015 OT Individual Time: 9798-9211 OT Individual Time Calculation (min): 75 min    Short Term Goals: Week 3:  OT Short Term Goal 1 (Week 3): STG=LTG d/t short remaining LOS.  Skilled Therapeutic Interventions/Progress Updates: ADL-retraining with focus on functional mobility using cane, transfers, toileting, and functional use of RUE as non-dominant extremity.   Pt able to ambulate to bathroom with close supervision.   Pt demo'd mild LOB 1 x due to obstacle of uneven floor in bathroom however pt recovered independently.   Pt completed toilet transfer and toileting with only questioning cues to improve efficiency in prep for planned bathing at shower.  Pt no longer using hand shower d/t awareness that her performance with bathing improves when she is not attempting to handle shower.   Pt returns to sink in w/c to dress and is able to maintain standing balance with supervision while donning underwear and pants.    Pt is able to don left shoe but is challenged by AFO and continues to require assist d/t narrow volar support.   Pt then noted that her infant son required attention and she attempted feeding him while in w/c with therapist providing questioning cues.   Pt able to feed infant using her right hand to hold bottle while supporting infant with left UE.   Pt is unable to safely handle infant when additional care is required but she is aware of and recalled benefit of lying in bed, supported by pillows, to simplify process and secure infant appropriately.   Session terminated early to support need for infant care.        Therapy Documentation Precautions:  Precautions Precautions: Fall Precaution Comments: R flexor synergy in LE during gait; motor apraxia; R hemiplegia; R inattention Restrictions Weight Bearing Restrictions: No   General: General OT Amount  of Missed Time: 15 Minutes  Pain: No/denies pain    ADL: ADL ADL Comments: see Functional Assessment  See Function Navigator for Current Functional Status.   Therapy/Group: Individual Therapy  [place] 06/25/2015, 2:41 PM

## 2015-06-26 ENCOUNTER — Inpatient Hospital Stay (HOSPITAL_COMMUNITY): Payer: BLUE CROSS/BLUE SHIELD

## 2015-06-26 ENCOUNTER — Inpatient Hospital Stay (HOSPITAL_COMMUNITY): Payer: BLUE CROSS/BLUE SHIELD | Admitting: *Deleted

## 2015-06-26 ENCOUNTER — Inpatient Hospital Stay (HOSPITAL_COMMUNITY): Payer: BLUE CROSS/BLUE SHIELD | Admitting: Speech Pathology

## 2015-06-26 ENCOUNTER — Inpatient Hospital Stay (HOSPITAL_COMMUNITY): Payer: BLUE CROSS/BLUE SHIELD | Admitting: Physical Therapy

## 2015-06-26 NOTE — Progress Notes (Signed)
Occupational Therapy Session Note  Patient Details  Name: Ashley [name] MRN: [id number] Date of Birth: Oct 04, 1976  Today's Date: 06/26/2015 OT Individual Time: 0930-1030 OT Individual Time Calculation (min): 60 min    Short Term Goals: Week 3:  OT Short Term Goal 1 (Week 3): STG=LTG d/t short remaining LOS.  Skilled Therapeutic Interventions/Progress Updates: ADL-retraining with focus on family education (husband [name]) on providing assistance and supervision during performance of BADL, homemaking tasks, and childcare.  Husband re-educated on use of DME to include furniture risers, transfer handle, wheeled cart in kitchen, and pillows/supports to facilitate pt's ability to provide infant care at bed level.  Husband re-educated on cognitive deficits requiring assistance with problem-solving, fall prevention, and compensation for memory deficits.   Pt declined bathing/dressing session as husband has been competent with assisting pt during CIR admission due to his presence during BADL, toileting, and transfer training.   Husband verbalizes awareness of recommendations and reports his confidence with providing assistance as pt returns home, with additional support from friends and family who have offered help with meals and supervision of patient when he is away.   Pt reports her mother will also assist as instructed however their relationship creates mild conflicts at times.   Pt is aware that her husband appears to minimize her cognitive impairments but she reports confidence that she will persuade him to re-adjust and support her as needed.   Husband performed assistance with transfers and floor recovery during this session.   Therapy Documentation Precautions:  Precautions Precautions: Fall Precaution Comments: R flexor synergy in LE during gait; motor apraxia; R hemiplegia; R inattention Restrictions Weight Bearing Restrictions: No  Vital Signs: Therapy Vitals Temp: 98.8 F (37.1 C) Temp  Source: Oral Pulse Rate: 85 Resp: 17 BP: 92/60 mmHg Patient Position (if appropriate): Sitting Oxygen Therapy SpO2: 100 % O2 Device: Not Delivered   Pain: No/denies pain   ADL: ADL ADL Comments: see Functional Assessment  See Function Navigator for Current Functional Status.   Therapy/Group: Individual Therapy   Second session: Time: 1410-1440 Time Calculation (min):  30 min  Pain Assessment: No/denies pain  Skilled Therapeutic Interventions: Therapeutic exercises with focus on improved right shoulder stability, methods for self-correcting incorrect postural alignment during HEP using mirror, and NMR of RUE.   Pt introduced to seated Yoga pose, place-and-hold movements with palms touching, and closed chain functional exercises to strengthen shoulder girdle.   Pt able to repeat movements as instructed during 1:1 session.   Printed HEP provided after session to approximate movements although pt will require re-ed and reinforcement training in outpatient treatment setting to retain instruction.  See FIM for current functional status  Therapy/Group: Individual Therapy  [name] 06/26/2015, 3:12 PM

## 2015-06-26 NOTE — Patient Instructions (Addendum)
Wall Shoulder Press-Out   With a helper standing by for safety, palms flat on wall, shoulder-width apart, and elbows straight, press shoulders back. Return. Repeat 10 times. Do 2 sessions during the day.  http://cc.exer.us/56   Copyright  VHI. All rights reserved.  Standing Shoulder Motion   With hands shoulder-width apart on table top, raise, then lower shoulders. Repeat 10 times or for 2 minutes. Do 2 sessions per day.  http://cc.exer.us/59   Copyright  VHI. All rights reserved.  Upper Back Strength:   Arms in waitress pose, palms up. While looking in a MIRROR to maintain symmetry, press hands back and slide shoulder blades down.  Hold for 5 breaths. Repeat 3 times.   Remember to also practice placing and holding your hands pressed together through various planes of motion as Pilar Plate showed you in front of the mirror.   Sorry York Cerise, there are no images available to duplicate what we did.   Yoga poses would be helpful.   Try them seated using just your hands first.   Copyright  VHI. All rights reserved.   Below are other exercises you could do with a helper standing close by for safety.     Chest Flexibility: Shoulder Opener (Wall)   Press actively into fingers on wall. While rolling shoulder back and shoulder blade down, turn body away from wall. Repeat 10 times with right arm.  Copyright  VHI. All rights reserved.  Chest Flexibility: Downward Dog Pose (Wall)   Standing with hands at shoulder height anchored on wall, step back into pose. Repeat 10 times.  Copyright  VHI. All rights reserved.

## 2015-06-26 NOTE — Progress Notes (Signed)
Speech Language Pathology Discharge Summary  Patient Details  Name: Ashley [name] MRN: [id number] Date of Birth: November 24, 1975  Today's Date: 06/26/2015 SLP Individual Time: 0174-9449; 1500-1530  SLP Individual Time Calculation (min): 26 min; 30 min    Skilled Therapeutic Interventions:   Session 1: Pt was seen for skilled ST targeting family education.  Upon arrival, pt was seated upright in wheelchair, awake, alert, and agreeable to participate in [place].  [name] was present during today's therapy session and was provided with skilled education regarding pt's progress made in therapies while inpatient.  [name] and pt aware and in agreement that pt's memory is not yet returned to baseline; therefore, SLP discussed compensatory strategies for memory, emphasizing environmental organization, use of written aids, and use of routine to maximize pt's functional independence for recall of daily information.  Pt and pt's husband both demonstrated good anticipatory awareness of how her current physical and cognitive limitations will impact her ability to complete complex home management and/or self care tasks independently.  SLP reviewed and reinforced recommendation that pt have assistance for medication and financial management at discharge due to memory deficits in addition to ST follow up.  All questions were answered to their satisfaction at this time.   Session 2: Pt was seen for skilled ST targeting cognitive goals.  Upon arrival, pt was sitting upright in recliner, awake, alert, and agreeable to participate in [place].  Pt with complaint of 4 out of 10 headache.  RN made aware, meds administered.  SLP administered the MoCA to measure progress from initial evaluation.  Pt scored 25 out of 30 (norm is > or = 26) with deficits most notable for delayed recall.  Pt with good insight into deficits and is in agreement with follow up outpatient ST follow up to continue to address higher level cognition.  At the  end of session, pt was returned to room and left in wheelchair, all needs left within reach.      Patient has met 8 of 8 long term goals.  Patient to discharge at overall Supervision level.  Reasons goals not met: n/a   Clinical Impression/Discharge Summary:  Pt made significant functional gains while inpatient and is discharging having met 8 out of 8 long term goals.  Pt is currently mod I for self monitoring and correction of verbal errors, as well as for use of compensatory word finding strategies in conversations.  Pt requires supervision during semi-complex self care and/or home management tasks due to decreased recall of new information.  She has good awareness of deficits but would benefit from follow up ST for continued practice in use of compensatory strategies in order to maximize functional independence and facilitate return to caring for her newborn and 2 young children.  Pt and family education is complete at this time.  Pt is discharging home with 24/7 supervision and assistance for medication and financial management in addition to ST follow up.   Care Partner:  Caregiver Able to Provide Assistance: Yes  Type of Caregiver Assistance: Physical;Cognitive  Recommendation:  24 hour supervision/assistance;Outpatient SLP  Rationale for SLP Follow Up: Maximize functional communication;Maximize cognitive function and independence;Reduce caregiver burden   Equipment: none recommended by SLP    Reasons for discharge: Discharged from hospital   Patient/Family Agrees with Progress Made and Goals Achieved: Yes   Function:  Eating Eating                 Cognition Comprehension Comprehension assist level: Follows complex conversation/direction  with extra time/assistive device  Expression   Expression assist level: Expresses complex ideas: With extra time/assistive device  Social Interaction Social Interaction assist level: Interacts appropriately with others with medication or  extra time (anti-anxiety, antidepressant).  Problem Solving Problem solving assist level: Solves basic 90% of the time/requires cueing < 10% of the time  Memory Memory assist level: Recognizes or recalls 90% of the time/requires cueing < 10% of the time   Ashley [name] 06/26/2015, 9:54 AM

## 2015-06-26 NOTE — Progress Notes (Signed)
Physical Therapy Discharge Summary  Patient Details  Name: Ashley [name] MRN: [id number] Date of Birth: December 06, 1975  Today's Date: D/C  06/27/2015 PT Individual Time: 06/26/15: 9528-4132 PT Individual Time Calculation (min): 53 min   Patient has met 11 of 12 long term goals due to improved balance, improved postural control, increased strength, increased range of motion, ability to compensate for deficits, functional use of  right upper extremity and right lower extremity, improved attention, improved awareness and improved coordination.  Patient to discharge at a wheelchair level Supervision and ambulation/stair negotiation with husband's min A.   Patient's care partner is independent to provide the necessary physical, cognitive and supervision assistance at discharge.  Reasons goals not met: Pt did reach supervision level for low car transfer but due to husband transporting pt in tall SUV, pt did require min A to enter/exit safely.  Recommendation:  Patient will benefit from ongoing skilled PT services in outpatient setting to continue to advance safe functional mobility, address ongoing impairments in R hemiparesis/tone, impaired proprioception, coordination and motor control, postural control, balance, gait, and minimize fall risk.  Equipment: quad cane and manual w/c  Reasons for discharge: treatment goals met and discharge from hospital  Patient/family agrees with progress made and goals achieved: Yes  PT Discharge [name] present for education.  Reviewed donning/wearing schedule for AFO; husband reports donning AFO with pt and feels comfortable assisting.  Reviewed safety recommendations for pt when husband not present and pt's mother providing supervision; due to pt's continued impaired balance, gait and falls risk it is recommended that pt ambulate with husband only and perform mobility at w/c level when mother present to provide supervision.  Pt and husband verbalized agreement.   Pt performed w/c mobility on unit x 150' mod I.  Reviewed car transfer with car simulator; husband reports he will transport pt in tall SUV with running board.  Pt and husband practiced entry/exit from simulated SUV with and without running board with husband providing min-mod A; pt continued to report not feeling comfortable with transfer.  PT to perform actual SUV transfer with pt and husband tomorrow at D/C.  Performed stair negotiation training for home entry/exit with RUE Husband HHA and LUE support on quad cane up/down 4 steps x 2-3 reps forwards to ascend and descend with min A and verbal cues for sequence + full flight 12 steps with L rail forwards to ascend, backwards to descend with husband's min A and verbal cues for safe sequence to access second level. Educated husband on w/c parts management: break down and set up.  Returned to room with pt ambulating with quad cane and husband providing min A for balance and safety.  Pt left in room with husband and all items within reach.    06/27/15: Accompanied pt and husband at D/C.  Pt demonstrated safe transfer into [place] with min A for balance while holding grab bar in car and stepping up onto running board with LLE and then turning to sit in seat; min A to bring RLE into SUV.  No further questions or concerns from pt or husband.    Pain  No c/o pain Vision/Perception   Continued impaired proprioception and attention to R side Sensation Sensation Light Touch: Impaired Detail Light Touch Impaired Details: Impaired RUE;Impaired RLE Proprioception: Impaired Detail Proprioception Impaired Details: Impaired RUE;Impaired RLE Coordination Gross Motor Movements are Fluid and Coordinated: No Motor  Motor Motor: Hemiplegia;Motor apraxia;Abnormal tone;Abnormal postural alignment and control  Mobility  Supervision overall bed mobility/transfers  Locomotion  Ambulation Ambulation/Gait Assistance: 4: Min guard Ambulation Distance (Feet): 150  Feet Assistive device: Small based quad cane Gait Gait Pattern: Impaired Gait Pattern: Step-through pattern;Decreased step length - right;Decreased stance time - right;Decreased stride length;Decreased hip/knee flexion - right;Decreased dorsiflexion - right;Decreased weight shift to right;Right genu recurvatum;Lateral trunk lean to left;Poor foot clearance - right  Trunk/Postural Assessment  Cervical Assessment Cervical Assessment: Within Functional Limits Thoracic Assessment Thoracic Assessment: Exceptions to [facility] (flexible kyphosis; bias to L) Lumbar Assessment Lumbar Assessment: Exceptions to [facility] (decreased lordosis) Postural Control Trunk Control: Rests in flexed posture but responds to tactile cues; requires tactile cues in standing for trunk elongation and weight shifting Righting Reactions: delayed  Balance Standardized Balance Assessment Standardized Balance Assessment: Berg Balance Test Berg Balance Test Sit to Stand: Able to stand  independently using hands Standing Unsupported: Able to stand 2 minutes with supervision Sitting with Back Unsupported but Feet Supported on Floor or Stool: Able to sit safely and securely 2 minutes Stand to Sit: Controls descent by using hands Transfers: Able to transfer safely, definite need of hands Standing Unsupported with Eyes Closed: Able to stand 10 seconds with supervision Standing Ubsupported with Feet Together: Able to place feet together independently and stand for 1 minute with supervision From Standing, Reach Forward with Outstretched Arm: Can reach forward >12 cm safely (5") From Standing Position, Pick up Object from Floor: Able to pick up shoe, needs supervision From Standing Position, Turn to Look Behind Over each Shoulder: Looks behind from both sides and weight shifts well Turn 360 Degrees: Needs assistance while turning Standing Unsupported, Alternately Place Feet on Step/Stool: Able to complete >2 steps/needs minimal  assist Standing Unsupported, One Foot in Front: Able to take small step independently and hold 30 seconds Standing on One Leg: Able to lift leg independently and hold equal to or more than 3 seconds Total Score: 37 Extremity Assessment  RLE Strength RLE Overall Strength Comments: 2-3/5 hip flexion/extension, knee flexion/extension, 1/5 ankle DF LLE Assessment LLE Assessment: Within Functional Limits   See Function Navigator for Current Functional Status.  Raylene Everts Faucette 06/26/2015, 8:48 PM

## 2015-06-26 NOTE — Progress Notes (Signed)
Subjective/Complaints:  Appreciate neurology note.  Looking forward to D/C      ROS denies CP, SOB, Abd pain, , bowel and bladder ok   , still notes memory issues Objective: Vital Signs: Blood pressure 91/44, pulse 83, temperature 98.2 F (36.8 C), temperature source Oral, resp. rate 16, weight 63.005 kg (138 lb 14.4 oz), SpO2 98 %, unknown if currently breastfeeding. No results found. No results found for this or any previous visit (from the past 72 hour(s)).   HEENT: normal Cardio: RRR and no murmur Resp: CTA B/L and unlabored GI: BS positive and NT, ND Extremity:  BS positive and NT, ND Skin:   Central line site healed over, bruising Left side neck Neuro: Alert/Oriented, Abnormal Sensory reduced sensation Right side in LE and right thumb , Left side 5/5, Abnormal Motor 3-/5 RUE grip, 3- bi and tri ,3- at delt, 3- hip/knee ext RLE and Expressive Aphasic, [place] 1- 2  at biceps femoris, 3 at ankle PF with tight heel cord,  Musc/Skel:  Other no pain with UE or LE ROM, mildly inverted at ankle, no ankle swelling Gen NAD   Assessment/Plan: 1. Functional deficits secondary to Right spastic hemiplegia,dysphagia, and aphasia secondary to large left parietal intracranial hemorrhage   which require 3+ hours per day of interdisciplinary therapy in a comprehensive inpatient rehab setting. Physiatrist is providing close team supervision and 24 hour management of active medical problems listed below. Physiatrist and rehab team continue to assess barriers to discharge/monitor patient progress toward functional and medical goals. D/C planned for am FIM: Function - Bathing Position: Shower Body parts bathed by patient: Right arm, Left arm, Chest, Abdomen, Front perineal area, Buttocks, Right upper leg, Left upper leg, Right lower leg, Left lower leg Body parts bathed by helper: Back Bathing not applicable: Back Assist Level: Supervision or verbal cues Set up : To adjust water  temperature  Function- Upper Body Dressing/Undressing What is the patient wearing?: Bra, Pull over shirt/dress Bra - Perfomed by patient: Thread/unthread right bra strap, Thread/unthread left bra strap, Hook/unhook bra (pull down sports bra) Bra - Perfomed by helper: Hook/unhook bra (pull down sports bra) Pull over shirt/dress - Perfomed by patient: Thread/unthread right sleeve, Thread/unthread left sleeve, Put head through opening, Pull shirt over trunk Pull over shirt/dress - Perfomed by helper: Pull shirt over trunk Assist Level: Set up Set up : To obtain clothing/put away Function - Lower Body Dressing/Undressing What is the patient wearing?: Pants, Underwear, Socks, AFO Position: Standing at sink Underwear - Performed by patient: Thread/unthread right underwear leg, Thread/unthread left underwear leg, Pull underwear up/down Underwear - Performed by helper: Pull underwear up/down Pants- Performed by patient: Thread/unthread right pants leg, Thread/unthread left pants leg, Pull pants up/down, Fasten/unfasten pants Pants- Performed by helper: Pull pants up/down Non-skid slipper socks- Performed by patient: Don/doff right sock, Don/doff left sock Socks - Performed by patient: Don/doff right sock, Don/doff left sock Shoes - Performed by patient: Don/doff left shoe Shoes - Performed by helper: Fasten right AFO - Performed by helper: Don/doff right AFO Assist Level: Set up Set up : To obtain clothing/put away  Function - Toileting Toileting activity did not occur: Safety/medical concerns Toileting steps completed by patient: Adjust clothing prior to toileting, Performs perineal hygiene, Adjust clothing after toileting Toileting steps completed by helper: Adjust clothing prior to toileting, Performs perineal hygiene, Adjust clothing after toileting Toileting Assistive Devices: Grab bar or rail Assist level: Supervision or verbal cues  Function - Air cabin crew transfer  activity  did not occur: Refused Toilet transfer assistive device: Film/video editor lift: Stedy Assist level to toilet: Supervision or verbal cues Assist level from toilet: Supervision or verbal cues Assist level to bedside commode (at bedside): 2 helpers Assist level from bedside commode (at bedside): Moderate assist (Pt 50 - 74%/lift or lower)  Function - Chair/bed transfer Chair/bed transfer method: Stand pivot Chair/bed transfer assist level: Supervision or verbal cues Chair/bed transfer assistive device: Orthosis, Bedrails, Armrests Chair/bed transfer details: Verbal cues for precautions/safety, Visual cues/gestures for precautions/safety, Verbal cues for sequencing, Verbal cues for technique  Function - Locomotion: Wheelchair Will patient use wheelchair at discharge?: Yes Type: Manual Max wheelchair distance: 150 Assist Level: Supervision or verbal cues Assist Level: Supervision or verbal cues Assist Level: Supervision or verbal cues Function - Locomotion: Ambulation Assistive device: Cane-quad, Orthosis Max distance: 150 Assist level: Touching or steadying assistance (Pt > 75%) Assist level: Touching or steadying assistance (Pt > 75%) Walk 50 feet with 2 turns activity did not occur: Safety/medical concerns Assist level: Touching or steadying assistance (Pt > 75%) Walk 150 feet activity did not occur: Safety/medical concerns Assist level: Touching or steadying assistance (Pt > 75%) Walk 10 feet on uneven surfaces activity did not occur: Safety/medical concerns  Function - Comprehension Comprehension: Auditory Comprehension assist level: Follows complex conversation/direction with extra time/assistive device  Function - Expression Expression: Verbal Expression assist level: Expresses basic 75 - 89% of the time/requires cueing 10 - 24% of the time. Needs helper to occlude trach/needs to repeat words.  Function - Social Interaction Social Interaction assist level: Interacts  appropriately with others with medication or extra time (anti-anxiety, antidepressant).  Function - Problem Solving Problem solving assist level: Solves basic 90% of the time/requires cueing < 10% of the time  Function - Memory Memory assist level: Recognizes or recalls 75 - 89% of the time/requires cueing 10 - 24% of the time Patient normally able to recall (first 3 days only): That he or she is in a hospital, Location of own room, Current season  Medical Problem List and Plan: 1. Functional deficits secondary to Right spastic hemiplegia,dysphagia, and aphasia secondary to large left parietal intracranial hemorrhage, also  cog def,  -repeat MRI shows post ictal changes, likely from prior seizure  -EEG showed no new seizure activity, focal slowing as expected left parietal occipital  temporal 2. DVT Prophylaxis/Anticoagulation: Pharmaceutical: Lovenox 3. Pain Management: Tylenol prn 4. Mood: LCSW to follow for evaluation and support as cognition/aphasia improves.  5. Neuropsych: This patient is not capable of making decisions on her own behalf. 6. Skin/Wound Care: Routine pressure relief measures.  7. Fluids/Electrolytes/Nutrition: Monitor I/O. Normal fluid and caloric intake       8. Insomnia: improved on trazodone,  9. Post CVA seizures: with breakthrough now controlled on keppra 1500mg  bid,no further seizures since readmit,  10. Hx anxiety , back on prozac 10mg --appears under control, this may increase dose will hold off for now 11.  Ankle contracture- heel cord, Cont night time PRAFO 12.  Spasticity improved per pt ,   Baclofen to D/C LOS (Days) 22 A FACE TO FACE EVALUATION WAS PERFORMED  KIRSTEINS,ANDREW E 06/26/2015, 8:35 AM   .akp

## 2015-06-27 MED ORDER — SENNOSIDES-DOCUSATE SODIUM 8.6-50 MG PO TABS: 1.0000 | ORAL_TABLET | Freq: Every evening | ORAL | Status: DC | PRN

## 2015-06-27 MED ORDER — LEVETIRACETAM 750 MG PO TABS: 1500.0000 mg | ORAL_TABLET | Freq: Two times a day (BID) | ORAL | Status: DC

## 2015-06-27 MED ORDER — FLUOXETINE HCL 10 MG PO CAPS
10.0000 mg | ORAL_CAPSULE | Freq: Every day | ORAL | Status: DC
Start: 2015-06-27 — End: 2015-08-21

## 2015-06-27 NOTE — Progress Notes (Signed)
Pt. discharged to home accompanied by husband.  D/C instructions per Algis Liming, PAC; pt. and husband verbalize understanding. VSS, c/o mild headache earlier, tylenol effective.

## 2015-06-27 NOTE — Discharge Instructions (Signed)
Inpatient Rehab Discharge Instructions  [name] Discharge date and time: 06/27/15   Activities/Precautions/ Functional Status: Activity: activity as tolerated Diet: cardiac diet Wound Care: N/A  Functional status:  ___ No restrictions     ___ Walk up steps independently _X__ 24/7 supervision/assistance   ___ Walk up steps with assistance _____ Intermittent supervision/assistance  ___ Bathe/dress independently _X__ Walk with cane     ___ Bathe/dress with assistance ___ Walk Independently    ___ Shower independently ___ Walk with assistance    _X__ Shower with assistance _X__ No alcohol     ___ Return to work/school ________   COMMUNITY REFERRALS UPON DISCHARGE:   Outpatient: PT     OT     ST  Agency:  [facility]                            [address] [place], [place]  [address]  Phone:  [phone]   Appointment Date/Time:  06-29-15 @ 9:30 (ST); 10:15 (OT); 11:00 (PT) - arrive 20 minutes early Medical Equipment/Items Ordered:  16x18 wheelchair (17 STF) with 16x18 basic cushion; wide base quad cane; tub transfer bench  Agency/Supplier:  [employer]                  Phone:  [phone]   GENERAL COMMUNITY RESOURCES FOR PATIENT/FAMILY: Support Groups:  Brain Injury Support Group                               Meets the second Tuesday of the month at 7 PM                              In the dayroom of Inpatient Rehab at [facility]                               [facility] Stroke Support Group                              Meet the 3rd Sunday of every month (except June, July, and August) at 3 PM                              In the dayroom of Inpatient Rehab at [facility] [phone])   Mental Health:      Based on your discussion with Dr. Beverly Gust, she recommends that you follow up with a counselor.  Here are a couple of names to get you started.  Make sure that  wherever you go accepts [employer].                              Lawana Chambers, LCSW (this is who I told you about when we talked in your room)  [facility]                              [address], [place], [place]  [address]                              [phone]                               Lennart Pall, [facility]                              [facility]                              [address]                              [place], [place]  [address]                              [phone]    Neuropsychologists:  Dr. Marlane Hatcher and Dr. Norton Pastel (if you want to do follow up neurological testing later on)                                    [facility]                                    [address]                                    [place], [place]  [address]                                    [phone]   [facility] Shield:  Dahlia Bailiff, Case Manager                                          [phone]   Special Instructions: 1. Needs supervision with medication and financial management.  2. Avoid aspirin products.  3.  No swimming, tub baths or  driving. Avoid  high altitudes and flashing lights.    STROKE/TIA DISCHARGE INSTRUCTIONS SMOKING Cigarette smoking nearly doubles your risk of having a stroke & is the single most alterable risk factor  If you smoke or have smoked in the last 12 months, you are advised to quit smoking for your health.  Most of the excess cardiovascular risk related to smoking disappears within a year of stopping.  Ask you doctor about anti-smoking medications  [place] Quit Line: 1-800-QUIT NOW  Free Smoking Cessation Classes [phone]  CHOLESTEROL Know your levels; limit fat & cholesterol in your diet  Lipid Panel  No results found for: CHOL, TRIG, HDL, CHOLHDL, VLDL,  LDLCALC    Many patients benefit from treatment even if their cholesterol is at goal.  Goal: Total Cholesterol (CHOL) less than 160  Goal:  Triglycerides (TRIG) less than 150  Goal:  HDL greater than 40  Goal:  LDL (LDLCALC) less than 100   BLOOD PRESSURE American Stroke Association blood pressure target is less that 120/80 mm/Hg  Your discharge blood pressure is:  BP: (!) 91/49 mmHg  Monitor your blood pressure  Limit your salt and alcohol intake  Many individuals will require more than one medication for high blood pressure  DIABETES (A1c is a blood sugar average for last 3 months) Goal HGBA1c is under 7% (HBGA1c is blood sugar average for last 3 months)  Diabetes: No known diagnosis of diabetes    No results found for: HGBA1C   Your HGBA1c can be lowered with medications, healthy diet, and exercise.  Check your blood sugar as directed by your physician  Call your physician if you experience unexplained or low blood sugars.  PHYSICAL ACTIVITY/REHABILITATION Goal is 30 minutes at least 4 days per week  Activity: No driving, Therapies: See above Return to work:  N/A  Activity decreases your risk of heart attack and stroke and makes your heart stronger.  It helps control your weight and blood pressure; helps you relax and can improve your mood.  Participate in a regular exercise program.  Talk with your doctor about the best form of exercise for you (dancing, walking, swimming, cycling).  DIET/WEIGHT Goal is to maintain a healthy weight  Your discharge diet is: Diet regular Room service appropriate?: Yes; Fluid consistency:: Thin  liquids Your height is:   Your current weight is: Weight: 63.005 kg (138 lb 14.4 oz) Your Body Mass Index (BMI) is:    Following the type of diet specifically designed for you will help prevent another stroke.  Your goal weight  is:    Your goal Body Mass Index (BMI) is 19-24.  Healthy food habits can help reduce 3 risk factors for stroke:   High cholesterol, hypertension, and excess weight.  RESOURCES Stroke/Support Group:  Call [phone]   STROKE EDUCATION PROVIDED/REVIEWED AND GIVEN TO PATIENT Stroke warning signs and symptoms How to activate emergency medical system (call 911). Medications prescribed at discharge. Need for follow-up after discharge. Personal risk factors for stroke. Pneumonia vaccine given:  Flu vaccine given:  My questions have been answered, the writing is legible, and I understand these instructions.  I will adhere to these goals & educational materials that have been provided to me after my discharge from the hospital.      My questions have been answered and I understand these instructions. I will adhere to these goals and the provided educational materials after my discharge from the hospital.  Patient/Caregiver Signature _______________________________ Date __________  Clinician Signature _______________________________________ Date __________  Please bring this form and your medication list with you to all your follow-up doctor's appointments.

## 2015-06-27 NOTE — Discharge Summary (Signed)
Physician Discharge Summary  Patient ID: Ashley [name] MRN: [id number] DOB/AGE: Sep 12, 1976 39 y.o.  Admit date: 06/04/2015 Discharge date: 06/27/2015  Discharge Diagnoses:  Principal Problem:   Hemiplga fol ntrm intcrbl hemor aff right dominant side Active Problems:   Aphasia following nontraumatic intracerebral hemorrhage   UTI (urinary tract infection)   Seizure disorder as sequela of cerebrovascular accident   Contracture of muscle ankle and foot   Adjustment disorder with depressed mood   Nontraumatic cortical hemorrhage of cerebral hemisphere   Discharged Condition:  Stable.   Significant Diagnostic Studies:  Mr Kizzie Fantasia Contrast  06/22/2015   CLINICAL DATA:  39 year old female with word finding difficulty. Status post intracranial hemorrhage last months 2 weeks postpartum. Initial encounter.  EXAM: MRI HEAD WITHOUT AND WITH CONTRAST  TECHNIQUE: Multiplanar, multiecho pulse sequences of the brain and surrounding structures were obtained without and with intravenous contrast.  CONTRAST:  67mL MULTIHANCE GADOBENATE DIMEGLUMINE 529 MG/ML IV SOLN  COMPARISON:  Head CT without contrast 06/02/2015. CTA head 05/23/2015. Brain MRI and intracranial MRA and MRV 05/21/2015.  FINDINGS: Intra-axial hemorrhage which has crossed through the body of the corpus callosum to the right of midline re- demonstrated. This is now mostly T1 and T2 hyperintense. The, shaped hematoma now encompasses 74 x 39 x 55 mm (AP by transverse by CC). This compares with 81 x 44 x 54 mm at a comparable level on 05/21/2015. Following contrast administration there is mild peripheral but no convincing central enhancement. Surrounding cerebral edema has regressed. Regional mass effect has regressed. Small volume intraventricular hemorrhage has regressed. There is residual left superior convexity subarachnoid hemorrhage or superficial siderosis (series 6, image 19 and series 9, image 76) which does extend to nearly down to the  level of the operculum. Evidence also of small volume left vertex subdural blood products, most apparent on diffusion-weighted imaging (series 5, image 20).  No ventriculomegaly. Patent basilar cisterns. Hemorrhage related susceptibility artifact on diffusion weighted imaging. No superimposed restricted diffusion or evidence of acute infarction. Major intracranial vascular flow voids are stable, including the superior sagittal sinus.  There new T2 hyperintensity in the left hippocampal formation (series 10, image 9) not associated with enhancement or mass effect. There is also subtle asymmetric gyral thickening and T2 hyperintensity at the left insula and operculum (series 10, image 5). Increased trace diffusion signal without restricted diffusion at that site. No associated hyperenhancement identified.  Elsewhere gray and white matter signal appears within normal limits. Patent basilar cisterns. Negative pituitary, cervicomedullary junction and visualized cervical spine. Visible internal auditory structures appear normal. Mastoids are clear. Paranasal sinuses are clear. Orbit and scalp soft tissues are within normal limits.  IMPRESSION: 1. Acute gyral swelling and T2 hyperintensity involving the left hippocampal formation, left insula, and operculum. No associated restricted diffusion, enhancement, or mass effect. Favor these are postictal changes. 2. Fairly large intra-axial hemorrhage of the posterior superior left hemisphere which cross midline through the body of the corpus callosum re- demonstrated. Mild regression of blood products since August. Mild peripheral enhancement without suspicious features at this time. Regressed cerebral edema and regional mass effect. Recommend repeat brain MRI without and with contrast once the hematoma has resolved. 3. Residual left superior and lateral convexity subarachnoid hemorrhage and/or superficial siderosis. Trace left vertex subdural hemorrhage suspected. Resolved  intraventricular hemorrhage and no ventriculomegaly.   Electronically Signed   By: Genevie Ann M.D.   On: 06/22/2015 13:36     Labs:  Basic Metabolic Panel:  Recent Labs  Lab 06/22/15 1110  NA 139  K 3.6  CL 101  CO2 31  GLUCOSE 68  BUN 5*  CREATININE 0.55  CALCIUM 9.6    CBC:  Recent Labs Lab 06/22/15 1110  WBC 7.0  HGB 13.5  HCT 41.3  MCV 90.6  PLT 259    CBG: No results for input(s): GLUCAP in the last 168 hours.  Brief HPI:   Ashley [name] is a 39 y.o. Postpartum female admitted on 05/20/15 with complaints of HA since delivery and developed rightht facial droop with aphasia, right sided weakness and seizures. She was found to have acute large left fronto-parietal IPH/SAH and was started on Keppra for treatment.  Work up without definite cerebral venous sinus/cortical vein thrombosis and no anticoagulation needed per neurology. She had resultant dense left hemiplegia, aphasia as well as dysphagia and was admitted to CIR on 05/28/15 for rehab. She was started on Seroquel to help with sleep wake disruption as well as Keflex for Klebsiella UTI. With increase in activity level, she has had worsening of RLE spasms and Zanaflex was added and titrated to bid to help with symptoms.  On 06/02/15 am, she developed seizures with obtundation and was transferred to acute services due to concerns of sepsis and was placed on broad spectrum antibiotics. Follow up CT head showed resolving left frontoparietal hemorrhage. Neurology question seizures as cause of MS changes and Keppra was increased to 1500 mg bid.   Patient has had improvement in use of RUE as well as verbal output and was cleared to return to CIR to complete her rehab course.    Hospital Course: Ashley [name] was admitted to rehab 06/04/2015 for inpatient therapies to consist of PT, ST and OT at least three hours five days a week. Past admission physiatrist, therapy team and rehab RN have worked together to provide customized  collaborative inpatient rehab. She completed additional seven day course of septra for UTI.  Po intake has improved and renal status has been stable. She is continent of bowel and bladder. Blood pressures have been stable and spasticity has resolved with ROM and increase in activity.  Neuropsychology and LCSW have been following for support of adjustment disorder with depressed mood.  She continues on prozax 10 mg daily and was advised to seek psychotherapy after discharge.  She did report increase in word finding deficits over 24 hours on 09/09 with high levels of anxiety but no decline in motor strength.  Neurology was consulted for input due to concerns of recurrent seizures. MRI brain done revealing resolving hemorrhage with left hippocampal hyperintensity suggesting post ictal changes.   Repeat EEG showed no evidence of seizures and post ictal changes felt to be old.  Anxiety about discharge, stress regarding psycho-social issues, increase in activity level as well as fatigue were felt to be the cause of her symptoms.  Patient was back to baseline later that day and no changes in anti-epileptic medications recommended.  Right heel cord contracture was treated with PRAFO at nights and she was fitted with R-AFO to help with gait quality. Headaches have been controlled with use of tylenol on prn basis.  She has continued to make good gains during her rehab stay and has progressed to requiring supervision at wheelchair level. She will continue to receive follow up outpatient PT, OT and ST at [facility] after discharge.    Rehab course: During patient's stay in rehab weekly team conferences were held to monitor patient's progress, set goals and discuss barriers  to discharge. At admission, patient required moderate assistance with ADL tasks and total assist with mobility. Cognitive evaluation revealed mild-moderate expressive>receptive aphasia which is further compounded by verbal and oral apraxia. She has  had improvement in activity tolerance, balance, postural control, as well as ability to compensate for deficits. She is has had improvement in functional use RUE  and RLE as well as improved awareness. She is able to complete ADL tasks with use of DME and supervision. She is requires supervision for transfers and is able to ambulate 150' with quad cane and min-guard assist. She is able to ascend/descent a flight of stairs with min assist and verbal cues for sequencing. Family education was done with husband and mother regarding all aspects of care. Patient educated on ambulating with husband alone due to impaired balance and fall risk.    Disposition:  Home  Diet: Low fat. Low cholesterol.   Special Instructions: 1. Needs supervision with medication and financial management.  2. Avoid aspirin products.  3.  No swimming, tub baths or  driving. Avoid  high altitudes and flashing lights.        Discharge Instructions    Ambulatory referral to Neurology    Complete by:  As directed   Pt will follow up with Dr. Leonie Man at [facility] in about 1 month. Thanks.            Medication List    STOP taking these medications        docusate sodium 100 MG capsule  Commonly known as:  COLACE     ibuprofen 600 MG tablet  Commonly known as:  ADVIL,MOTRIN     sulfamethoxazole-trimethoprim 800-160 MG per tablet  Commonly known as:  BACTRIM DS,SEPTRA DS     traZODone 50 MG tablet  Commonly known as:  DESYREL      TAKE these medications        acetaminophen 500 MG tablet  Commonly known as:  TYLENOL  Take 1,000 mg by mouth every 6 (six) hours as needed for headache.     acyclovir 400 MG tablet  Commonly known as:  ZOVIRAX  Take 400 mg by mouth 5 (five) times daily as needed (for breakout).     FLUoxetine 10 MG capsule  Commonly known as:  PROZAC  Take 1 capsule (10 mg total) by mouth at bedtime.     levETIRAcetam 750 MG tablet  Commonly known as:  KEPPRA  Take 2 tablets (1,500 mg total) by  mouth 2 (two) times daily.     senna-docusate 8.6-50 MG per tablet  Commonly known as:  Senokot-S  Take 1 tablet by mouth at bedtime as needed for mild constipation.        Follow-up Information    Follow up with [name], MD On 07/16/2015.   Specialty:  Physical Medicine and Rehabilitation   Why:  Be there at 9:45   for  10 am  appointment   Contact information:   [address] [place] [address] [phone]       Follow up with Antony Contras, MD. Call today.   Specialties:  Neurology, Radiology   Why:  for appointment in stroke clinic   Contact information:   [address] [place] [address] [phone]       Follow up with Marton Redwood, MD On 07/02/2015.   Specialty:  Internal Medicine   Why:  @ 11:30 AM   Contact information:   [place]  [address] [phone]       Signed: Bary Leriche 06/27/2015, 10:03 AM

## 2015-06-27 NOTE — Progress Notes (Signed)
Subjective/Complaints: Pt without new issues Discussed pacing herself at D/C Objective: Vital Signs: Blood pressure 91/49, pulse 74, temperature 98.2 F (36.8 C), temperature source Oral, resp. rate 17, weight 63.005 kg (138 lb 14.4 oz), SpO2 95 %, unknown if currently breastfeeding. No results found. No results found for this or any previous visit (from the past 72 hour(s)).   HEENT: normal Cardio: RRR and no murmur Resp: CTA B/L and unlabored GI: BS positive and NT, ND Extremity:  BS positive and NT, ND Skin:   Central line site healed over, bruising Left side neck Neuro: Alert/Oriented, Abnormal Sensory reduced sensation Right side in LE and right thumb , Left side 5/5, Abnormal Motor 3+/5 RUE grip, 3+ bi and tri ,3+ at delt, 3- hip/knee ext RLE and Expressive Aphasic, [place] 1- 2  at biceps femoris, 3 at ankle PF with tight heel cord,  Musc/Skel:  Other no pain with UE or LE ROM, mildly inverted at ankle, no ankle swelling Gen NAD   Assessment/Plan: 1. Functional deficits secondary to Right spastic hemiplegia,dysphagia, and aphasia secondary to large left parietal intracranial hemorrhage  Stable for D/C today F/u PCP in 1-2 weeks F/u PM&R 3 weeks See D/C summary See D/C instructions FIM: Function - Bathing Position: Shower Body parts bathed by patient: Right arm, Left arm, Chest, Abdomen, Front perineal area, Buttocks, Right upper leg, Left upper leg, Right lower leg, Left lower leg Body parts bathed by helper: Back Bathing not applicable: Back Assist Level: Supervision or verbal cues Set up : To adjust water temperature  Function- Upper Body Dressing/Undressing What is the patient wearing?: Bra, Pull over shirt/dress Bra - Perfomed by patient: Thread/unthread right bra strap, Thread/unthread left bra strap, Hook/unhook bra (pull down sports bra) Bra - Perfomed by helper: Hook/unhook bra (pull down sports bra) Pull over shirt/dress - Perfomed by patient:  Thread/unthread right sleeve, Thread/unthread left sleeve, Put head through opening, Pull shirt over trunk Pull over shirt/dress - Perfomed by helper: Pull shirt over trunk Assist Level: Set up Set up : To obtain clothing/put away Function - Lower Body Dressing/Undressing What is the patient wearing?: Pants, Underwear, Socks, AFO Position: Standing at sink Underwear - Performed by patient: Thread/unthread right underwear leg, Thread/unthread left underwear leg, Pull underwear up/down Underwear - Performed by helper: Pull underwear up/down Pants- Performed by patient: Thread/unthread right pants leg, Thread/unthread left pants leg, Pull pants up/down, Fasten/unfasten pants Pants- Performed by helper: Pull pants up/down Non-skid slipper socks- Performed by patient: Don/doff right sock, Don/doff left sock Socks - Performed by patient: Don/doff right sock, Don/doff left sock Shoes - Performed by patient: Don/doff left shoe Shoes - Performed by helper: Fasten right AFO - Performed by helper: Don/doff right AFO Assist Level: Set up Set up : To obtain clothing/put away  Function - Toileting Toileting activity did not occur: Safety/medical concerns Toileting steps completed by patient: Adjust clothing prior to toileting, Performs perineal hygiene, Adjust clothing after toileting Toileting steps completed by helper: Adjust clothing prior to toileting, Performs perineal hygiene, Adjust clothing after toileting Toileting Assistive Devices: Grab bar or rail Assist level: Supervision or verbal cues  Function - Air cabin crew transfer activity did not occur: Refused Toilet transfer assistive device: Film/video editor lift: Stedy Assist level to toilet: Supervision or verbal cues Assist level from toilet: Supervision or verbal cues Assist level to bedside commode (at bedside): 2 helpers Assist level from bedside commode (at bedside): Moderate assist (Pt 50 - 74%/lift or lower)  Function -  Chair/bed transfer Chair/bed transfer method: Stand pivot, Ambulatory Chair/bed transfer assist level: Supervision or verbal cues Chair/bed transfer assistive device: Orthosis, Armrests, Cane Chair/bed transfer details: Verbal cues for precautions/safety, Visual cues/gestures for precautions/safety, Verbal cues for sequencing, Verbal cues for technique, Verbal cues for safe use of DME/AE  Function - Locomotion: Wheelchair Will patient use wheelchair at discharge?: Yes Type: Manual Max wheelchair distance: 150 Assist Level: No help, No cues, assistive device, takes more than reasonable amount of time Assist Level: No help, No cues, assistive device, takes more than reasonable amount of time Assist Level: No help, No cues, assistive device, takes more than reasonable amount of time Turns around,maneuvers to table,bed, and toilet,negotiates 3% grade,maneuvers on rugs and over doorsills: Yes Function - Locomotion: Ambulation Assistive device: Cane-quad, Orthosis Max distance: 150 Assist level: Touching or steadying assistance (Pt > 75%) Assist level: Touching or steadying assistance (Pt > 75%) Walk 50 feet with 2 turns activity did not occur: Safety/medical concerns Assist level: Touching or steadying assistance (Pt > 75%) Walk 150 feet activity did not occur: Safety/medical concerns Assist level: Touching or steadying assistance (Pt > 75%) Walk 10 feet on uneven surfaces activity did not occur: Safety/medical concerns  Function - Comprehension Comprehension: Auditory Comprehension assist level: Follows complex conversation/direction with extra time/assistive device  Function - Expression Expression: Verbal Expression assist level: Expresses complex ideas: With extra time/assistive device  Function - Social Interaction Social Interaction assist level: Interacts appropriately with others with medication or extra time (anti-anxiety, antidepressant).  Function - Problem Solving Problem  solving assist level: Solves basic 90% of the time/requires cueing < 10% of the time  Function - Memory Memory assist level: Recognizes or recalls 90% of the time/requires cueing < 10% of the time Patient normally able to recall (first 3 days only): That he or she is in a hospital, Location of own room, Current season  Medical Problem List and Plan: 1. Functional deficits secondary to Right spastic hemiplegia,dysphagia, and aphasia secondary to large left parietal intracranial hemorrhage, also  cog def,  -repeat MRI shows post ictal changes, likely from prior seizure  -EEG showed no new seizure activity, focal slowing as expected left parietal occipital  temporal 2. DVT Prophylaxis/Anticoagulation: Pharmaceutical: Lovenox, D/C 3. Pain Management: Tylenol prn 4. Mood: LCSW to follow for evaluation and support as cognition/aphasia improves.  5. Neuropsych: This patient is not capable of making decisions on her own behalf. 6. Skin/Wound Care: Routine pressure relief measures.  7. Fluids/Electrolytes/Nutrition: Monitor I/O. Normal fluid and caloric intake       8. Insomnia: improved on trazodone,  9. Post CVA seizures: with breakthrough now controlled on keppra 1500mg  bid,no further seizures since readmit,  10. Hx anxiety , back on prozac 10mg --appears under control, this may increase dose will hold off for now 11.  Ankle contracture- heel cord, Cont night time PRAFO  LOS (Days) 23 A FACE TO FACE EVALUATION WAS PERFORMED  Ashley [name] E 06/27/2015, 9:06 AM   .akp

## 2015-06-28 NOTE — Progress Notes (Signed)
Occupational Therapy Discharge Summary  Patient Details  Name: Ashley [name] MRN: [id number] Date of Birth: [birth year]  Patient has met 37 of 14 long term goals due to improved balance, ability to compensate for deficits and functional use of  RIGHT upper extremity.  Patient to discharge at overall Supervision level.  Patient's care partner is independent to provide the necessary physical and cognitive assistance at discharge.    Reasons goals not met: Anticipatory awareness goal not met due to unresolved family dynamics involving pt's mother and husband's perspective on the level of assistance they can provide and their understanding of pt's potential for full recovery.  Recommendation:  Patient will benefit from ongoing skilled OT services in outpatient setting to continue to advance functional skills in the area of BADL and iADL.  Equipment: Tub bench  Reasons for discharge: discharge from hospital  Patient/family agrees with progress made and goals achieved: Yes  OT Discharge Precautions/Restrictions  Precautions Precautions: Fall Required Braces or Orthoses: Other Brace/Splint Other Brace/Splint: Right AFO while ambulating with quad-tipped cane Restrictions Weight Bearing Restrictions: No  Pain Pain Assessment Pain Assessment: No/denies pain  ADL ADL ADL Comments: see FUNCTIONAL ASSESSMENT  Vision/Perception  Vision- History Baseline Vision/History: No visual deficits Vision- Assessment Vision Assessment?: No apparent visual deficits   Cognition Overall Cognitive Status: Impaired/Different from baseline Arousal/Alertness: Awake/alert Orientation Level: Oriented X4 Attention: Alternating Alternating Attention: Appears intact Memory: Impaired Memory Impairment: Decreased recall of new information;Decreased short term memory Decreased Short Term Memory: Verbal complex;Functional complex Awareness: Impaired Awareness Impairment: Anticipatory impairment Problem  Solving: Impaired Problem Solving Impairment: Functional complex Executive Function: Self Monitoring;Self Correcting Reasoning: Appears intact Sequencing: Appears intact Organizing: Impaired Organizing Impairment: Functional complex;Verbal complex Decision Making: Appears intact Initiating: Appears intact Self Monitoring: Impaired Self Monitoring Impairment: Functional complex Self Correcting: Impaired Self Correcting Impairment: Functional complex Behaviors: Poor frustration tolerance Safety/Judgment: Appears intact  Sensation Sensation Light Touch: Impaired Detail Light Touch Impaired Details: Impaired RUE;Impaired RLE Stereognosis: Impaired Detail Stereognosis Impaired Details: Impaired RUE;Impaired RLE Hot/Cold: Appears Intact Proprioception: Impaired Detail Proprioception Impaired Details: Impaired RUE;Impaired RLE Coordination Gross Motor Movements are Fluid and Coordinated: No Fine Motor Movements are Fluid and Coordinated: No Coordination and Movement Description: R hemiplegia; flexor synergy return/persists when fatigued  Motor  Motor Motor: Hemiplegia;Motor apraxia;Abnormal tone;Abnormal postural alignment and control  Mobility  Bed Mobility Bed Mobility: Rolling Right;Rolling Left;Supine to Sit;Sitting - Scoot to [employer] of Bed;Sit to Supine;Scooting to [facility] Rolling Right: 6: Modified independent (Device/Increase time) Rolling Left: 6: Modified independent (Device/Increase time) Right Sidelying to Sit: 6: Modified independent (Device/Increase time) Supine to Sit: 6: Modified independent (Device/Increase time) Sitting - Scoot to Edge of Bed: 6: Modified independent (Device/Increase time) Sit to Supine: 6: Modified independent (Device/Increase time) Scooting to [facility]: 6: Modified independent (Device/Increase time)   Extremity/Trunk Assessment RUE Assessment RUE Assessment: Exceptions to [facility] RUE AROM (degrees) Overall AROM Right Upper Extremity: Deficits RUE  Overall AROM Comments: Gross active shoulder flexion, internal/external rotation and forearm supination reduced approx 25% during functional tasks RUE PROM (degrees) Overall PROM Right Upper Extremity: Within functional limits for tasks performed RUE Strength RUE Overall Strength: Deficits RUE Overall Strength Comments: grossly 3-/5 at RUE RUE Tone RUE Tone: Mild;Hypertonic LUE Assessment LUE Assessment: Within Functional Limits   See Function Navigator for Current Functional Status.  Salome Spotted 06/28/2015, 3:21 PM

## 2015-06-28 NOTE — Progress Notes (Signed)
Social Work Discharge Note  The overall goal for the admission was met for:   Discharge location: Yes - home  Length of Stay: Yes - 32 days (with 2 days interrupted stay on acute for seizure)  Discharge activity level: Yes - supervision  Home/community participation: Yes  Services provided included: MD, RD, PT, OT, SLP, RN, TR, Pharmacy, Neuropsych and SW  Financial Services: Private Insurance: [employer]  Follow-up services arranged: Outpatient: PT/OT/ST, DME: small base quad cane; 16x18 lightweight w/c; 16x18 basic cushion; tub transfer bench and Patient/Family has no preference for HH/DME agencies  Comments (or additional information):  Pt is returning to her home with her husband, mother, and other family/friends to provide 24/7 supervision.  Pt is aware that she should not "jump right back in" to daily life as a mother of 2 and that she should still rest and allow her brain time to heal.  She expressed understanding.  Pt will go to outpt therapy at [facility] for PT/OT/ST.    Patient/Family verbalized understanding of follow-up arrangements: Yes  Individual responsible for coordination of the follow-up plan: pt with husband for support  Confirmed correct DME delivered: ,  [employer] 06/28/2015    ,  [facility] 

## 2015-06-29 ENCOUNTER — Ambulatory Visit: Payer: BLUE CROSS/BLUE SHIELD | Admitting: Occupational Therapy

## 2015-06-29 ENCOUNTER — Ambulatory Visit: Payer: BLUE CROSS/BLUE SHIELD

## 2015-06-29 ENCOUNTER — Ambulatory Visit: Payer: BLUE CROSS/BLUE SHIELD | Attending: Physical Medicine & Rehabilitation

## 2015-06-29 DIAGNOSIS — R531 Weakness: Secondary | ICD-10-CM | POA: Insufficient documentation

## 2015-06-29 DIAGNOSIS — I69898 Other sequelae of other cerebrovascular disease: Secondary | ICD-10-CM | POA: Insufficient documentation

## 2015-06-29 DIAGNOSIS — R278 Other lack of coordination: Secondary | ICD-10-CM | POA: Insufficient documentation

## 2015-06-29 DIAGNOSIS — I69219 Unspecified symptoms and signs involving cognitive functions following other nontraumatic intracranial hemorrhage: Secondary | ICD-10-CM

## 2015-06-29 DIAGNOSIS — I6921 Cognitive deficits following other nontraumatic intracranial hemorrhage: Secondary | ICD-10-CM | POA: Diagnosis present

## 2015-06-29 DIAGNOSIS — R4701 Aphasia: Secondary | ICD-10-CM | POA: Diagnosis present

## 2015-06-29 DIAGNOSIS — R41841 Cognitive communication deficit: Secondary | ICD-10-CM

## 2015-06-29 DIAGNOSIS — IMO0002 Reserved for concepts with insufficient information to code with codable children: Secondary | ICD-10-CM

## 2015-06-29 DIAGNOSIS — R269 Unspecified abnormalities of gait and mobility: Secondary | ICD-10-CM | POA: Insufficient documentation

## 2015-06-29 DIAGNOSIS — R279 Unspecified lack of coordination: Secondary | ICD-10-CM | POA: Insufficient documentation

## 2015-06-29 DIAGNOSIS — R29898 Other symptoms and signs involving the musculoskeletal system: Secondary | ICD-10-CM | POA: Diagnosis present

## 2015-06-29 NOTE — Therapy (Signed)
[facility] [address] [place], [place], [address] Phone: [phone]   Fax:  [phone]  Speech Language Pathology Evaluation  Patient Details  Name: Ashley [name] MRN: [id number] Date of Birth: [birth year] Referring Provider:  [name], MD  Encounter Date: 06/29/2015      End of Session - 06/29/15 1031    Visit Number 1   Number of Visits 24   Date for SLP Re-Evaluation 09/12/15   Authorization - Number of Visits 39   SLP Start Time 0933   SLP Stop Time  1016   SLP Time Calculation (min) 43 min   Activity Tolerance Patient tolerated treatment well      Past Medical History  Diagnosis Date  . Anxiety   . ICH (intracerebral hemorrhage)     Past Surgical History  Procedure Laterality Date  . Dilation and curettage of uterus      There were no vitals filed for this visit.  Visit Diagnosis: Cognitive communication deficit  Expressive aphasia      Subjective Assessment - 06/29/15 0938    Subjective Pt reports taking extra time needed for fluent speech.   Patient is accompained by: Family member  husband            SLP Evaluation [facility] - 06/29/15 0940    SLP Visit Information   SLP Received On 06/29/15   Onset Date 05-20-15   Medical Diagnosis CVA   Pain Assessment   Pain Score 0-No pain   Cognition   Attention Selective   Selective Attention Impaired   Selective Attention Impairment Verbal basic;Functional basic;Verbal complex;Functional complex   Memory Impaired   Memory Impairment --  related to attention   Executive Function Decision Making   Behaviors Restless;Impulsive   Auditory Comprehension   Overall Auditory Comprehension Appears within functional limits for tasks assessed   Standardized Assessments   Standardized Assessments  Other Assessment  [facility]kins Verbal Learning WNL recall, 9/12 recognition                         SLP Education - 06/29/15  1029    Education provided Yes   Education Details cognitive-linguistic deficits, attention and impulsivity interplay, make to-do lists and have pt repeat instructions back due to decr'd attention   Person(s) Educated Patient   Methods Explanation   Comprehension Verbalized understanding;Need further instruction          SLP Short Term Goals - 06/29/15 1035    SLP SHORT TERM GOAL #1   Title pt will alternate attention between simple cognitive-linguistic tasks with 85% success and WFL switch time   Time 4   Period Weeks  or 12 visits, for all STGs   Status New   SLP SHORT TERM GOAL #2   Title pt will demo verbal safety awareness related to deficits   Time 4   Period Weeks   Status New   SLP SHORT TERM GOAL #3   Title pt will complete mod complex time, money, sequencing, calendar problems 95% with modified independence (self-correction)   Time 4   Period Weeks   Status New          SLP Long Term Goals - 06/29/15 1040    SLP LONG TERM GOAL #1   Title pt will divide attention between two simple cognitive linguistic tasks 90% each task   Time 8   Period Weeks  or 24 visits, for all LTGs  Status New   SLP LONG TERM GOAL #2   Title pt will demo anticipatory awareness by using compensatory measures for memory and/or decr'd attention x1 in a session over 4 sessions   Time 8   Period Weeks   Status New   SLP LONG TERM GOAL #3   Title pt will demo executive function in a simple task with rare min A   Time 8   Period Weeks   Status New          Plan - 06/29/15 1032    Clinical Impression Statement Pt presents with mod cognitive-linguistic deficits including attention, awareness, executive function, and reported mild expressive aphasia/apraxia with fatigue or excitement. Pt would benefit from skilled ST to maximze pt's independnece, reduce caregiver burden, and maintain her home with three children.   Speech Therapy Frequency 3x / week   Duration --  8 weeks (or 24  therapy visits)   Treatment/Interventions Cognitive reorganization;SLP instruction and feedback;Compensatory strategies;Internal/external aids;Environmental controls;Patient/family education;Functional tasks;Cueing hierarchy;Language facilitation   Potential to Achieve Goals Good   Potential Considerations Ability to learn/carryover information        Problem List Patient Active Problem List   Diagnosis Date Noted  . Nontraumatic cortical hemorrhage of cerebral hemisphere   . Seizure   . Seizures   . Adjustment disorder with depressed mood   . Contracture of muscle ankle and foot 06/11/2015  . Hemiplga fol ntrm intcrbl hemor aff right dominant side 06/06/2015  . Left-sided intracerebral hemorrhage 06/04/2015  . Seizure disorder as sequela of cerebrovascular accident   . Seizure disorder 06/03/2015  . Sepsis 06/03/2015  . UTI (urinary tract infection) 06/03/2015  . Hypotension 06/02/2015  . Right spastic hemiparesis 05/28/2015  . 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 History of anxiety disorder 05/28/2015  . ICH (intracerebral hemorrhage)   . Seizure disorder, nonconvulsive, with status epilepticus   . Cerebral venous thrombosis of cortical vein   . Cytotoxic cerebral edema   . IVH (intraventricular hemorrhage)   . Term pregnancy 05/07/2015  . Spontaneous vaginal delivery 05/07/2015    [facility] , [place], [place]  06/29/2015, 10:55 AM  [facility] [address] [place], [place], [address] Phone: [phone]   Fax:  [phone]

## 2015-06-29 NOTE — Patient Instructions (Signed)
   Try making a to-do list for the following day, no more than 5 things. When they are done, cross them off. You won't have the distraction of continuing to recall if they were done.

## 2015-06-29 NOTE — Therapy (Addendum)
[facility] [address] [place], [place], [address] Phone: [phone]   Fax:  [phone]  Occupational Therapy Evaluation  Patient Details  Name: Ashley [name] MRN: [id number] Date of Birth: Mar 06, 1976 Referring Provider:  [name], MD  Encounter Date: 06/29/2015      OT End of Session - 06/29/15 1716    Visit Number 1   Number of Visits 17   Date for OT Re-Evaluation 08/29/15   Authorization Type [employer]   Authorization Time Period 30 visit PT/ OT combined, pt/ husband plan to self pay if needed after benefits are used   Authorization - Visit Number 1   Authorization - Number of Visits 15   OT Start Time 1105   OT Stop Time 1145   OT Time Calculation (min) 40 min   Activity Tolerance Patient tolerated treatment well   Behavior During Therapy [facility] for tasks assessed/performed      Past Medical History  Diagnosis Date  . Anxiety   . ICH (intracerebral hemorrhage)     Past Surgical History  Procedure Laterality Date  . Dilation and curettage of uterus      There were no vitals filed for this visit.  Visit Diagnosis:  Weakness due to cerebrovascular accident - Plan: Ot plan of care cert/re-cert  Decreased coordination - Plan: Ot plan of care cert/re-cert  Cognitive deficits following oth ntrm intcrn hemorrhage - Plan: Ot plan of care cert/re-cert  Abnormality of gait - Plan: Ot plan of care cert/re-cert         [facility] OT Assessment - 06/30/15 0001    Assessment   Diagnosis CVA   Onset Date 05/21/15   Assessment Pt adm. with left parietal CVA with IVH, cerebral edema , partial seizure, obstructive hydrocephalus, and SAH presents with right sided weakness, decreased coordination, decreased balance, and cognitive deficits. Pt can benefit from skilled OT.  Pt had given birth to her 3rd child 2 weeks prior to hosp.   Prior Therapy CIR  d/c home 06/27/15   Precautions   Precautions Fall    Restrictions   Weight Bearing Restrictions No   Balance Screen   Has the patient fallen in the past 6 months Yes   How many times? 1   Has the patient had a decrease in activity level because of a fear of falling?  Yes   Is the patient reluctant to leave their home because of a fear of falling?  No   Home  Environment   Family/patient expects to be discharged to: Private residence   Divide Two level   Lives With Spouse   Prior Function   Level of [place] Part time employment   Vocation Requirements interventional radiology, pt does training  requires fine motor coordination   Leisure running   ADL   Eating/Feeding Needs assist with cutting food   Grooming Supervision/safety   Upper Body Bathing Minimal assistance   Lower Body Bathing Minimal assistance   Upper Body Dressing Minimal assistance  unable to fasten bran    Lower Body Dressing Minimal assistance  with brace, and shoe   Toilet Tranfer Minimal assistance   Tub/Shower Transfer Minimal assistance   IADL   Shopping Needs to be accompanied on any shopping trip   [place] help with all home maintenance tasks   Meal Prep Needs to have meals prepared and served   Mobility   Mobility Status --  min a with quad cane   Written Expression   Dominant Hand Right   Handwriting --  not tested   Vision Assessment   Vision Assessment Vision impaired  _ to be further tested in functional context   Cognition   Overall Cognitive Status Cognition to be further assessed in functional context PRN   Area of Impairment Memory   Memory Decreased short-term memory   Attention Selective   Selective Attention Impaired   Memory Impaired   Sensation   Light Touch Impaired by gross assessment  for RUE   Coordination   Gross Motor Movements are Fluid and Coordinated No   Fine Motor Movements are Fluid and Coordinated No   9 Hole Peg Test Right;Left   Right 9 Hole  Peg Test 43.18, 36.57   Left 9 Hole Peg Test 16.62   AROM   Overall AROM  Deficits   Overall AROM Comments RUEsh. flex 115, abduct 95, full elbow flexion/ extnesion, wrist extension 60, full composite finger flexion   Hand Function   Right Hand Gross Grasp Functional   Right Hand Grip (lbs) 55 lbs   Left Hand Grip (lbs) 60 lbs   RUE Tone   RUE Tone Comments mild hypertonicity at elbow                           OT Short Term Goals - 06/30/15 1427    OT SHORT TERM GOAL #1   Title I with HEP.   Baseline due 07/28/15   Time 4   Period Weeks   Status New   OT SHORT TERM GOAL #2   Title Pt will demonstrate impoved fine motor coordinationas evidenced by decreasing RUE 9 hole peg test score to 35 secs or less.   Time 4   Period Weeks   Status New   OT SHORT TERM GOAL #3   Title Pt will perform all basic ADLs at a supervision level.   Time 4   Period Weeks   Status New   OT SHORT TERM GOAL #4   Title Pt will use RUE as an active assist for ADLS/IADLS at least 50 % of the time.   Time 4   Period Weeks   Status New   OT SHORT TERM GOAL #5   Title Pt will perform basic home management/ cooking at min A level.   Time 4   Period Weeks   Status New           OT Long Term Goals - 06/30/15 1431    OT LONG TERM GOAL #1   Title Pt will perfom all basic ADLS modidied independently.   Baseline due 08/27/15   Time 8   Period Weeks   Status New   OT LONG TERM GOAL #2   Title Pt will perform basic home management/ cooking at a supervision level demonstrating good safety awareness.   Time 8   Period Weeks   Status New   OT LONG TERM GOAL #3   Title Pt will demonstrate ability to retrieve a 3 lbs weight at 120 x 5 reps without dropping with RUE   Time 8   Period Weeks   Status New   OT LONG TERM GOAL #4   Title Pt will resume use of RUE as dominant hand at least 75% of the time for ADLs/ IADLs.   Time 8   Period Weeks   Status New   OT LONG  TERM GOAL #5    Title Pt will demonstrate ability to perform a physical and cognitive task simultaneously with 80% or better accuracy.   Time 8   Period Weeks   Status New   Long Term Additional Goals   Additional Long Term Goals Yes   OT LONG TERM GOAL #6   Title Pt will demonstrate improved fine motor coordination as evidenced by decreasing RUE 9 hole peg test score to 30 secs or less.   Time 8   Period Weeks   Status New               Plan - 06/29/15 1712    Clinical Impression Statement Pt admitted 05/21/15 for left parietal ICH with IVH, cerebral edema, partial seizure, obstructive hydrocephalus, and SAH presents with weakness and decreased coordination in right dominant side as well as cognitive deficits. Pt can benefit from skilled occupational therapy to maximize indpendence with ADLs/ IADLs. Pt ihad just given birth to her 3rd child 2 weeks prior to hospitalization.   Pt will benefit from skilled therapeutic intervention in order to improve on the following deficits (Retired) Abnormal gait;Decreased coordination;Decreased range of motion;Difficulty walking;Impaired flexibility;Decreased endurance;Decreased safety awareness;Increased edema;Impaired tone;Decreased knowledge of precautions;Decreased activity tolerance;Decreased balance;Decreased knowledge of use of DME;Pain;Impaired UE functional use;Impaired vision/preception;Decreased cognition;Decreased mobility;Decreased strength   Rehab Potential Good   OT Frequency 2x / week  plus eval   OT Duration 8 weeks   OT Treatment/Interventions Self-care/ADL training;Therapeutic exercise;Patient/family education;Balance training;Ultrasound;Neuromuscular education;Manual Therapy;Splinting;Therapeutic exercises;Energy conservation;Parrafin;DME and/or AE instruction;Therapeutic activities;Cognitive remediation/compensation;Gait Training;Fluidtherapy;Electrical Stimulation;Moist Heat;Contrast Bath;Passive range of motion;Visual/perceptual  remediation/compensation   Plan issue HEP for RUE coordiantion and functional use.   Consulted and Agree with Plan of Care Patient;Family member/caregiver   Family Member Consulted husband        Problem List Patient Active Problem List   Diagnosis Date Noted  . Nontraumatic cortical hemorrhage of cerebral hemisphere   . Seizure   . Seizures   . Adjustment disorder with depressed mood   . Contracture of muscle ankle and foot 06/11/2015  . Hemiplga fol ntrm intcrbl hemor aff right dominant side 06/06/2015  . Left-sided intracerebral hemorrhage 06/04/2015  . Seizure disorder as sequela of cerebrovascular accident   . Seizure disorder 06/03/2015  . Sepsis 06/03/2015  . UTI (urinary tract infection) 06/03/2015  . Hypotension 06/02/2015  . Right spastic hemiparesis 05/28/2015  . 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 History of anxiety disorder 05/28/2015  . ICH (intracerebral hemorrhage)   . Seizure disorder, nonconvulsive, with status epilepticus   . Cerebral venous thrombosis of cortical vein   . Cytotoxic cerebral edema   . IVH (intraventricular hemorrhage)   . Term pregnancy 05/07/2015  . Spontaneous vaginal delivery 05/07/2015    RINE,KATHRYN 06/30/2015, 2:52 PM Theone Murdoch, OTR/L [phone] Phone: [phone] 2:52 PM 06/30/2015 [facility] [address] [place], [place], [address] Phone: [phone]   Fax:  [phone]

## 2015-06-30 NOTE — Addendum Note (Signed)
Addended by: Theone Murdoch B on: 06/30/2015 02:52 PM   Modules accepted: Orders

## 2015-06-30 NOTE — Therapy (Signed)
[facility] [address] [place], [place], [address] Phone: [phone]   Fax:  [phone]  Physical Therapy Evaluation  Patient Details  Name: Ashley [name] MRN: [id number] Date of Birth: [birth year] Referring Provider:  [name], MD  Encounter Date: 06/29/2015      PT End of Session - 06/30/15 1148    Visit Number 1   Number of Visits 17   Date for PT Re-Evaluation 08/28/15   Authorization Type BC[employer] (30 visits total for OT/PT). Pt and pt's husband stated they will pay out of pocket after visits are complete, if necessary.   Authorization - Visit Number 1   Authorization - Number of Visits 15   PT Start Time 1101   PT Stop Time 1145   PT Time Calculation (min) 44 min   Equipment Utilized During Treatment Gait belt   Activity Tolerance Patient tolerated treatment well   Behavior During Therapy WFL for tasks assessed/performed      Past Medical History  Diagnosis Date  . Anxiety   . ICH (intracerebral hemorrhage)     Past Surgical History  Procedure Laterality Date  . Dilation and curettage of uterus      There were no vitals filed for this visit.  Visit Diagnosis:  Abnormality of gait - Plan: PT plan of care cert/re-cert  Right leg weakness - Plan: PT plan of care cert/re-cert  Coordination impairment - Plan: PT plan of care cert/re-cert      Subjective Assessment - 06/29/15 1107    Subjective Pt reported she had a stroke 7 weeks ago, several days after delivering her baby. Pt reported she had a blood clot in uterus after delivery and that it was removed without complication. Pt very physically fit prior to pregnancy, enjoyed running. Therefore, when d/c home after delivery, she began walking. She went for several walks around the neighbor and began having headaches after walking. HA did not cease after second walk. She went to the hospital with R-sided weakness and it was found pt had ICH,  followed by several seizures. Pt reports being "out of it" in the hospital for about 2 weeks. Pt is slowly regaining R-sided strength but still feels off balance. Pt received inpt rehab for 2 weeks and was discharged last week. Pt had a hard time after PT last Friday, reporting slurred speech and difficulty "getting words out". MD told pt to decr. intensity of activity. Last seizure was about 3 weeks ago, she experienced blurry vision in R eye prior to seizure.  Pt reports she is frustrated with R-sided weakness and worries she won't return to normal, she reports emotional lability.   Patient is accompained by: Family member  Ashley [name]-husband   Pertinent History Seizures, low BP   Patient Stated Goals "Be as close to normal as much as possible and to go back to running" Get back to taking care of my kids, 16 weeks old and 88 and 59 years old   Currently in Pain? No/denies            [facility] PT Assessment - 06/29/15 1121    Assessment   Medical Diagnosis ICH post-partum   Onset Date/Surgical Date 05/20/15   Prior Therapy Inpt rehab   Precautions   Precautions Fall   Precaution Comments Based on TUG and gait speed   Restrictions   Weight Bearing Restrictions No   Other Position/Activity Restrictions Intensity of exercise limited due to seizures per MD   Balance Screen  Has the patient fallen in the past 6 months Yes   How many times? 1  in the shower   Has the patient had a decrease in activity level because of a fear of falling?  No   Is the patient reluctant to leave their home because of a fear of falling?  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[place])   Leisure run, listen to music, parties, play puzzless, travel, go to the lake   Cognition   Overall Cognitive Status Impaired/Different from baseline   Area of Impairment Memory  increased time for processing   Memory Decreased short-term memory   Attention Selective   Selective Attention Impaired   Sensation   Light Touch Impaired by gross assessment  R LE   Additional Comments N/T in R LE   Coordination   Gross Motor Movements are Fluid and Coordinated No   Fine Motor Movements are Fluid and Coordinated No   Coordination and Movement Description R hemiplegia; decreased speed   Posture/Postural Control   Posture/Postural Control Postural limitations   Postural Limitations Forward head;Posterior pelvic tilt   Tone   Assessment Location Right Lower Extremity   ROM / Strength   AROM / PROM / Strength AROM;Strength   AROM   Overall AROM  Deficits   Overall AROM Comments R DF limited by weakness   Strength   Overall Strength Deficits   Overall Strength Comments L LE WFL. R LE: hip flex: 3+/5, knee flex: 3/5, knee ext: 4/5, ankle DF: 2/5   Transfers   Transfers Sit to Stand;Stand to Sit   Sit to Stand 5: Supervision;With upper extremity assist;From chair/3-in-1   Stand to Sit 5: Supervision;With upper extremity assist;To chair/3-in-1   Ambulation/Gait   Ambulation/Gait Yes   Ambulation/Gait Assistance 4: Min guard;5: Supervision;4: Min assist   Ambulation/Gait Assistance Details Supervision when amb. in straight trajectory, min guard during turns and min A during 2 LOB episodes at end of session 2/2 fatigue.   Ambulation Distance (Feet) 100 Feet   Assistive device Large base quad cane  OPPT LBQC, as pt forgot  her SBQC at home   Gait Pattern Step-to pattern;Decreased step length - left;Decreased dorsiflexion - right;Decreased hip/knee flexion - right;Decreased stride length;Ataxic   Ambulation Surface Level;Indoor   Gait velocity 1.4ft/sec.  with [facility]   Balance   Balance Assessed Yes   Static Standing Balance   Static Standing - Balance Support No upper extremity supported   Static Standing - Level of Assistance 4: Min assist;5: Stand by assistance   Static Standing - Comment/# of Minutes Pt able to stand with no UE support and feet apart for 30 seconds with S and required min A to maintain balance during R LE SLS.   Standardized Balance Assessment   Standardized Balance Assessment Timed Up and Go Test   Timed Up and Go Test  TUG Normal TUG   Normal TUG (seconds) 20.55  with LBQC   RLE Tone   RLE Tone Modified Ashworth   RLE Tone   Modified Ashworth Scale for Grading Hypertonia RLE Slight increase in muscle tone, manifested by a catch and release or by minimal resistance at the end of the range of motion when the affected part(s) is moved in flexion or extension                           PT Education - 06/30/15 1146    Education provided Yes   Education Details PT frequency/duration, outcome measure results. PT also educated pt on neuro psychologist on staff, if pt feels the need to speak to someone about her feelings and emotions following ICH. Pt reported she received a list of names for pyschologists at d/c from inpt rehab. She stated that she would let PT know if she would like to see a psychologist.    Person(s) Educated Patient;Spouse   Methods Explanation   Comprehension Verbalized understanding          PT Short Term Goals - 06/30/15 1154    PT SHORT TERM GOAL #1   Title Pt will be IND in HEP to improve strength, balance, and endurance. Target date: 07/27/15.   Status New   PT SHORT TERM GOAL #2   Title Pt will improve gait speed to >/=1.67ft/sec to  decrease falls risk. Target date: 07/27/15.   Status New   PT SHORT TERM GOAL #3   Title Pt will ambulate 300' with LRAD, over even terrain, at MOD I level to improve functional mobility. Target date: 07/27/15.   Status New   PT SHORT TERM GOAL #4   Title Pt will perform TUG in </=13.5 seconds with LRAD to decr. falls risk. Target date: 07/27/15.   Status New   PT SHORT TERM GOAL #5   Title Perform BERG and write STG and LTG as appropriate. Target date: 07/27/15.   Status New           PT Long Term Goals - 06/30/15 1157    PT LONG TERM GOAL #1   Title Pt will ambulate 1000' over even/uneven terrain, with LRAD, at MOD I level to improve functional mobiliity. Target date: 08/24/15.   Status New   PT LONG TERM GOAL #2   Title Pt will amb. 300' over even terrain without an AD, IND, to safely amb. in home. Target date: 08/24/15.   Status New   PT LONG TERM GOAL #3   Title Pt will improve gait speed to >/=2.34ft/sec, with LRAD, to amb. safely in the community. Target date: 08/24/15.   Status New   PT LONG TERM GOAL #4   Title Pt will verbalize plans to join a fitness center upon d/c from PT to continue to maintain strength and endurance gains made during PT. Target date: 08/24/15.   Status New               Plan - 06/30/15 1149    Clinical Impression Statement Pt is a pleasant 39y/o female presenting to OPPT neuro s/p post-partum ICH. Pt presented with R-sided weakness, impaired balance, decr. endurance, impaired coordination, spasticity, and gait deviations. Pt is at risk for falls based on gait speed and TUG time. Pt also presented with feelings of frustration and sadness based on deficits after ICH, PT will continue to monitor pt, will keep MD updated, and request referral  to a pyschologist prn.    Pt will benefit from skilled therapeutic intervention in order to improve on the following deficits Abnormal gait;Decreased endurance;Impaired sensation;Decreased knowledge of  precautions;Decreased activity tolerance;Decreased knowledge of use of DME;Decreased strength;Impaired UE functional use;Impaired tone;Decreased balance;Decreased mobility;Decreased cognition;Decreased range of motion;Decreased safety awareness;Decreased coordination;Impaired flexibility   Rehab Potential Good   Clinical Impairments Affecting Rehab Potential Seizures which may limit intensity of PT   PT Frequency 2x / week   PT Duration 8 weeks   PT Treatment/Interventions ADLs/Self Care Home Management;Neuromuscular re-education;Cognitive remediation;Biofeedback;DME Instruction;Gait training;Stair training;Canalith Repostioning;Patient/family education;Orthotic Fit/Training;Balance training;Therapeutic exercise;Manual techniques;Therapeutic activities;Vestibular   PT Next Visit Plan Perform BERG and write goal if appropriate, initiate strengthening/balance HEP.   Consulted and Agree with Plan of Care Patient;Family member/caregiver   Family Member Consulted pt's husband-Ashley [name]         Problem List Patient Active Problem List   Diagnosis Date Noted  . Nontraumatic cortical hemorrhage of cerebral hemisphere   . Seizure   . Seizures   . Adjustment disorder with depressed mood   . Contracture of muscle ankle and foot 06/11/2015  . Hemiplga fol ntrm intcrbl hemor aff right dominant side 06/06/2015  . Left-sided intracerebral hemorrhage 06/04/2015  . Seizure disorder as sequela of cerebrovascular accident   . Seizure disorder 06/03/2015  . Sepsis 06/03/2015  . UTI (urinary tract infection) 06/03/2015  . Hypotension 06/02/2015  . Right spastic hemiparesis 05/28/2015  . 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 History of anxiety disorder 05/28/2015  . ICH (intracerebral hemorrhage)   . Seizure disorder, nonconvulsive, with status epilepticus   . Cerebral venous thrombosis of cortical vein   . Cytotoxic cerebral edema   . IVH (intraventricular hemorrhage)   .  Term pregnancy 05/07/2015  . Spontaneous vaginal delivery 05/07/2015    Miller,Jennifer L 06/30/2015, 12:05 PM  [facility] [address] [place], [place], [address] Phone: [phone]   Fax:  [phone]    Geoffry Paradise, PT,DPT 06/30/2015 12:05 PM Phone: [phone] Fax: [phone]

## 2015-07-03 ENCOUNTER — Encounter: Payer: Self-pay | Admitting: Occupational Therapy

## 2015-07-03 ENCOUNTER — Ambulatory Visit: Payer: BLUE CROSS/BLUE SHIELD | Admitting: Occupational Therapy

## 2015-07-03 ENCOUNTER — Ambulatory Visit: Payer: BLUE CROSS/BLUE SHIELD | Admitting: Physical Therapy

## 2015-07-03 DIAGNOSIS — R279 Unspecified lack of coordination: Principal | ICD-10-CM

## 2015-07-03 DIAGNOSIS — R269 Unspecified abnormalities of gait and mobility: Secondary | ICD-10-CM

## 2015-07-03 DIAGNOSIS — R29898 Other symptoms and signs involving the musculoskeletal system: Secondary | ICD-10-CM

## 2015-07-03 DIAGNOSIS — R278 Other lack of coordination: Secondary | ICD-10-CM

## 2015-07-03 DIAGNOSIS — IMO0002 Reserved for concepts with insufficient information to code with codable children: Secondary | ICD-10-CM

## 2015-07-03 DIAGNOSIS — I69219 Unspecified symptoms and signs involving cognitive functions following other nontraumatic intracranial hemorrhage: Secondary | ICD-10-CM

## 2015-07-03 NOTE — Patient Instructions (Signed)
ANKLE: Dorsiflexion - Sitting   Sitting, place strap around foot. Pull foot toward body, keeping heel on floor. Keep foot straight. Hold 30 seconds. 2 reps per set, 2 sets per day.  Copyright  VHI. All rights reserved.   ANKLE: Dorsiflexion - Standing   Place hand on wall for support. Place left leg in front of other. Keep right knee straight and press back heel into floor. Hold 30 seconds. 2 reps per set, 2 sets per day.  Copyright  VHI. All rights reserved.   Chair Sitting   Sit at edge of seat, spine straight, right leg extended. Put a hand on each thigh and bend forward from the hip, keeping spine straight. Allow hand on extended leg to reach toward toes. Support upper body with other arm. Hold 30 seconds. Repeat 2 times per session. Do 2 sessions per day.  Copyright  VHI. All rights reserved.

## 2015-07-03 NOTE — Therapy (Signed)
[facility] [address] [place], [place], [address] Phone: [phone]   Fax:  [phone]  Occupational Therapy Treatment  Patient Details  Name: Ashley [name] MRN: [id number] Date of Birth: November 21, 1975 Referring Provider:  Marton Redwood, MD  Encounter Date: 07/03/2015      OT End of Session - 07/03/15 1304    Visit Number 2   Number of Visits 17   Date for OT Re-Evaluation 08/29/15   Authorization Type BCBS   Authorization Time Period 30 visit PT/ OT combined, pt/ husband plan to self pay if needed after benefits are used   OT Start Time 0931   OT Stop Time 1015   OT Time Calculation (min) 44 min   Activity Tolerance Patient tolerated treatment well      Past Medical History  Diagnosis Date  . Anxiety   . ICH (intracerebral hemorrhage)     Past Surgical History  Procedure Laterality Date  . Dilation and curettage of uterus      There were no vitals filed for this visit.  Visit Diagnosis:  Weakness due to cerebrovascular accident  Decreased coordination  Cognitive deficits following oth ntrm intcrn hemorrhage      Subjective Assessment - 07/03/15 0938    Subjective  My elbow feels really tight but I have no pain in my shoulder   Pertinent History see epic snapshot; pt with SAH 2 weeks after deliverying her 3rd child   Patient Stated Goals to be able to use my R arm normally   Currently in Pain? No/denies                      OT Treatments/Exercises (OP) - 07/03/15 0001    Neurological Re-education Exercises   Other Exercises 1 Neuro re ed to design appropriate HEP for RUE to encourage more normal movement patterns. Pt educated regarding flexor synergy influence and the importance of avoiding development of shoulder pain as well as importance of quality of movement for better functional return of the dominant RUE.  Pt able to return demonstrate all exercises with same repetitions she  will do at home - please see pt instruction section for details. Repetition used due to cognitive deficits. Pt also given HEP in writing.                OT Education - 07/03/15 1300    Education provided Yes   Education Details HEP for [employer]) Educated Patient   Methods Explanation;Demonstration;Tactile cues;Verbal cues;Handout   Comprehension Verbalized understanding;Returned demonstration  pt able to return demonstate however would briefly check at next appointment given cognitive deficits          OT Short Term Goals - 07/03/15 1301    OT SHORT TERM GOAL #1   Title I with HEP.   Baseline due 07/28/15   Time 4   Period Weeks   Status On-going   OT SHORT TERM GOAL #2   Title Pt will demonstrate impoved fine motor coordinationas evidenced by decreasing RUE 9 hole peg test score to 35 secs or less.   Time 4   Period Weeks   Status On-going   OT SHORT TERM GOAL #3   Title Pt will perform all basic ADLs at a supervision level.   Time 4   Period Weeks   Status On-going   OT SHORT TERM GOAL #4   Title Pt will use RUE as an active assist for ADLS/IADLS at  least 50 % of the time.   Time 4   Period Weeks   Status On-going   OT SHORT TERM GOAL #5   Title Pt will perform basic home management/ cooking at min A level.   Time 4   Period Weeks   Status On-going           OT Long Term Goals - 07/03/15 1301    OT LONG TERM GOAL #1   Title Pt will perfom all basic ADLS modidied independently.   Baseline due 08/27/15   Time 8   Period Weeks   Status On-going   OT LONG TERM GOAL #2   Title Pt will perform basic home management/ cooking at a supervision level demonstrating good safety awareness.   Time 8   Period Weeks   Status On-going   OT LONG TERM GOAL #3   Title Pt will demonstrate ability to retrieve a 3 lbs weight at 120 x 5 reps without dropping with RUE   Time 8   Period Weeks   Status On-going   OT LONG TERM GOAL #4   Title Pt will resume use  of RUE as dominant hand at least 75% of the time for ADLs/ IADLs.   Time 8   Period Weeks   Status On-going   OT LONG TERM GOAL #5   Title Pt will demonstrate ability to perform a physical and cognitive task simultaneously with 80% or better accuracy.   Time 8   Period Weeks   Status On-going   OT LONG TERM GOAL #6   Title Pt will demonstrate improved fine motor coordination as evidenced by decreasing RUE 9 hole peg test score to 30 secs or less.   Time 8   Period Weeks   Status On-going               Plan - 07/03/15 1302    Clinical Impression Statement Pt making gains toward goals. Pt reports she is trying to use her hand as much as possible at home. Reviewed all goals and pt in agreement.   Pt will benefit from skilled therapeutic intervention in order to improve on the following deficits (Retired) Abnormal gait;Decreased coordination;Decreased range of motion;Difficulty walking;Impaired flexibility;Decreased endurance;Decreased safety awareness;Increased edema;Impaired tone;Decreased knowledge of precautions;Decreased activity tolerance;Decreased balance;Decreased knowledge of use of DME;Pain;Impaired UE functional use;Impaired vision/preception;Decreased cognition;Decreased mobility;Decreased strength   Rehab Potential Good   OT Frequency 2x / week   OT Duration 8 weeks   OT Treatment/Interventions Self-care/ADL training;Therapeutic exercise;Patient/family education;Balance training;Ultrasound;Neuromuscular education;Manual Therapy;Splinting;Therapeutic exercises;Energy conservation;Parrafin;DME and/or AE instruction;Therapeutic activities;Cognitive remediation/compensation;Gait Training;Fluidtherapy;Electrical Stimulation;Moist Heat;Contrast Bath;Passive range of motion;Visual/perceptual remediation/compensation   Plan check HEP for RUE; possbily add RUE coordination and functional use program   Consulted and Agree with Plan of Care Patient        Problem List Patient  Active Problem List   Diagnosis Date Noted  . Nontraumatic cortical hemorrhage of cerebral hemisphere   . Seizure   . Seizures   . Adjustment disorder with depressed mood   . Contracture of muscle ankle and foot 06/11/2015  . Hemiplga fol ntrm intcrbl hemor aff right dominant side 06/06/2015  . Left-sided intracerebral hemorrhage 06/04/2015  . Seizure disorder as sequela of cerebrovascular accident   . Seizure disorder 06/03/2015  . Sepsis 06/03/2015  . UTI (urinary tract infection) 06/03/2015  . Hypotension 06/02/2015  . Right spastic hemiparesis 05/28/2015  . 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 History of anxiety disorder 05/28/2015  . ICH (  intracerebral hemorrhage)   . Seizure disorder, nonconvulsive, with status epilepticus   . Cerebral venous thrombosis of cortical vein   . Cytotoxic cerebral edema   . IVH (intraventricular hemorrhage)   . Term pregnancy 05/07/2015  . Spontaneous vaginal delivery 05/07/2015    Quay Burow, OTR/L 07/03/2015, 1:05 PM  [facility] [address] [place], [place], [address] Phone: [phone]   Fax:  [phone]

## 2015-07-03 NOTE — Patient Instructions (Signed)
Home Program for your arm:  Do these 2 times per day every day. STOP if you get any pain and let your therapist know immediately.  We do NOT want any pain seeting up in your shoulder or elbow.    Laying on your back:  1.  Hold a ball between your hands with your palms facing each other.  Raise the ball toward the ceiling until your elbows are straight.  KEEPING YOUR ELBOWS STRAIGHT, slowly raise your arms over your head as long as you can keep your elbow straight and within your pain limits.  Then lower arms toward your knees.  Do 12, rest then do 12 more. This should not be painful.  Go slowly as fast movements will make your arm tighter.    2.  Hold a ball between your hands with palms facing each other.  Raise the ball toward the ceiling until elbows are straight. HOLD FOR A SLOW COUNT OF 5.  Lower the bal back toward your chest keeping elbows in and controlling the ball all the way to your chest.  Do 12, rest then do 12 more.   3. In sitting, hold ball between your hands.  Keeping elbows straight and hand open, raise ball until it is level with your shoulder, then lower ball.  Do 12, rest then do 12 more.    Keep arm relaxed - the ball is light!!  Do not overdo the exercises.  Think about making your arms as LONG as possible.

## 2015-07-03 NOTE — Therapy (Signed)
[facility] [address] [place], [place], [address] Phone: [phone]   Fax:  [phone]  Physical Therapy Treatment  Patient Details  Name: Ashley [name] MRN: [id number] Date of Birth: [birth year] Referring Provider:  Marton Redwood, MD  Encounter Date: 07/03/2015      PT End of Session - 07/03/15 1312    Visit Number 2   Number of Visits 17   Date for PT Re-Evaluation 08/28/15   Authorization Type BC[employer] (30 visits total for OT/PT). Pt and pt's husband stated they will pay out of pocket after visits are complete, if necessary.   Authorization - Visit Number 2   Authorization - Number of Visits 15   PT Start Time [phone]   PT Stop Time 0933   PT Time Calculation (min) 44 min   Equipment Utilized During Treatment Gait belt   Activity Tolerance Patient tolerated treatment well   Behavior During Therapy WFL for tasks assessed/performed      Past Medical History  Diagnosis Date  . Anxiety   . ICH (intracerebral hemorrhage)     Past Surgical History  Procedure Laterality Date  . Dilation and curettage of uterus      There were no vitals filed for this visit.  Visit Diagnosis:  Decreased coordination  Abnormality of gait  Right leg weakness      Subjective Assessment - 07/03/15 0853    Subjective Pt denies falls or changes since last visit.   Pertinent History Seizures, low BP   Currently in Pain? No/denies                         [facility] Adult PT Treatment/Exercise - 07/03/15 0856    Transfers   Transfers Sit to Stand;Stand to Sit   Sit to Stand 6: Modified independent (Device/Increase time)   Stand to Sit 6: Modified independent (Device/Increase time)   Ambulation/Gait   Ambulation/Gait Yes   Ambulation/Gait Assistance 5: Supervision   Ambulation/Gait Assistance Details with R AFO;R knee hyperextension   Ambulation Distance (Feet) 110 Feet  80   Assistive device Small based quad  cane   Gait Pattern Step-to pattern;Decreased step length - left;Decreased dorsiflexion - right;Decreased hip/knee flexion - right;Decreased stride length;Ataxic;Right genu recurvatum   Ambulation Surface Level;Indoor   Berg Balance Test   Sit to Stand Able to stand without using hands and stabilize independently   Standing Unsupported Able to stand safely 2 minutes   Sitting with Back Unsupported but Feet Supported on Floor or Stool Able to sit safely and securely 2 minutes   Stand to Sit Sits safely with minimal use of hands   Transfers Able to transfer safely, minor use of hands   Standing Unsupported with Eyes Closed Able to stand 10 seconds safely   Standing Ubsupported with Feet Together Able to place feet together independently and stand 1 minute safely   From Standing, Reach Forward with Outstretched Arm Can reach confidently >25 cm (10")   From Standing Position, Pick up Object from Floor Able to pick up shoe, needs supervision   From Standing Position, Turn to Look Behind Over each Shoulder Looks behind from both sides and weight shifts well   Turn 360 Degrees Able to turn 360 degrees safely but slowly   Standing Unsupported, Alternately Place Feet on Step/Stool Able to complete >2 steps/needs minimal assist   Standing Unsupported, One Foot in Front Able to plae foot ahead of the other independently and  hold 30 seconds   Standing on One Leg Able to lift leg independently and hold equal to or more than 3 seconds   Total Score 47   Knee/Hip Exercises: Stretches   Active Hamstring Stretch Right;2 reps;30 seconds  seated edge of mat   Other Knee/Hip Stretches seated heel cord stretch with towel x 30 seconds x 2;standing runners stretch at wall with assist for foot placement x 30 seconds   Ankle Exercises: Seated   Toe Raise 5 reps   Toe Raise Limitations poor active ROM on R-fatigues quickly                PT Education - 07/03/15 1311    Education provided Yes   Education  Details HEP;having husband assist with runners stretch at wall for R foot placement   Person(s) Educated Patient   Methods Explanation;Demonstration;Verbal cues;Tactile cues   Comprehension Verbalized understanding          PT Short Term Goals - 06/30/15 1154    PT SHORT TERM GOAL #1   Title Pt will be IND in HEP to improve strength, balance, and endurance. Target date: 07/27/15.   Status New   PT SHORT TERM GOAL #2   Title Pt will improve gait speed to >/=1.70ft/sec to decrease falls risk. Target date: 07/27/15.   Status New   PT SHORT TERM GOAL #3   Title Pt will ambulate 300' with LRAD, over even terrain, at MOD I level to improve functional mobility. Target date: 07/27/15.   Status New   PT SHORT TERM GOAL #4   Title Pt will perform TUG in </=13.5 seconds with LRAD to decr. falls risk. Target date: 07/27/15.   Status New   PT SHORT TERM GOAL #5   Title Perform BERG and write STG and LTG as appropriate. Target date: 07/27/15.   Status New           PT Long Term Goals - 06/30/15 1157    PT LONG TERM GOAL #1   Title Pt will ambulate 1000' over even/uneven terrain, with LRAD, at MOD I level to improve functional mobiliity. Target date: 08/24/15.   Status New   PT LONG TERM GOAL #2   Title Pt will amb. 300' over even terrain without an AD, IND, to safely amb. in home. Target date: 08/24/15.   Status New   PT LONG TERM GOAL #3   Title Pt will improve gait speed to >/=2.32ft/sec, with LRAD, to amb. safely in the community. Target date: 08/24/15.   Status New   PT LONG TERM GOAL #4   Title Pt will verbalize plans to join a fitness center upon d/c from PT to continue to maintain strength and endurance gains made during PT. Target date: 08/24/15.   Status New               Plan - 07/03/15 1314    Clinical Impression Statement Pt with increased tone in RLE/decreased coordination.  Difficulty with R foot placement and positioning for ther ex.  R knee genu recurvatum with  gait with AFO.  Continue PT per POC.   Pt will benefit from skilled therapeutic intervention in order to improve on the following deficits Abnormal gait;Decreased endurance;Impaired sensation;Decreased knowledge of precautions;Decreased activity tolerance;Decreased knowledge of use of DME;Decreased strength;Impaired UE functional use;Impaired tone;Decreased balance;Decreased mobility;Decreased cognition;Decreased range of motion;Decreased safety awareness;Decreased coordination;Impaired flexibility   Rehab Potential Good   Clinical Impairments Affecting Rehab Potential Seizures which may limit intensity of PT  PT Frequency 2x / week   PT Duration 8 weeks   PT Treatment/Interventions ADLs/Self Care Home Management;Neuromuscular re-education;Cognitive remediation;Biofeedback;DME Instruction;Gait training;Stair training;Canalith Repostioning;Patient/family education;Orthotic Fit/Training;Balance training;Therapeutic exercise;Manual techniques;Therapeutic activities;Vestibular   PT Next Visit Plan Gait with wedge in R to trial for hyperextension control.  Strengthening HEP.   Consulted and Agree with Plan of Care Patient        Problem List Patient Active Problem List   Diagnosis Date Noted  . Nontraumatic cortical hemorrhage of cerebral hemisphere   . Seizure   . Seizures   . Adjustment disorder with depressed mood   . Contracture of muscle ankle and foot 06/11/2015  . Hemiplga fol ntrm intcrbl hemor aff right dominant side 06/06/2015  . Left-sided intracerebral hemorrhage 06/04/2015  . Seizure disorder as sequela of cerebrovascular accident   . Seizure disorder 06/03/2015  . Sepsis 06/03/2015  . UTI (urinary tract infection) 06/03/2015  . Hypotension 06/02/2015  . Right spastic hemiparesis 05/28/2015  . 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 History of anxiety disorder 05/28/2015  . ICH (intracerebral hemorrhage)   . Seizure disorder, nonconvulsive, with  status epilepticus   . Cerebral venous thrombosis of cortical vein   . Cytotoxic cerebral edema   . IVH (intraventricular hemorrhage)   . Term pregnancy 05/07/2015  . Spontaneous vaginal delivery 05/07/2015    Narda Bonds 07/03/2015, 1:17 PM  [facility] [address] [place], [place], [address] Phone: [phone]   Fax:  [place], [place] 07/03/2015 1:17 PM Phone: [phone] Fax: [phone]

## 2015-07-04 ENCOUNTER — Ambulatory Visit: Payer: BLUE CROSS/BLUE SHIELD

## 2015-07-04 DIAGNOSIS — R41841 Cognitive communication deficit: Secondary | ICD-10-CM

## 2015-07-04 DIAGNOSIS — R269 Unspecified abnormalities of gait and mobility: Secondary | ICD-10-CM | POA: Diagnosis not present

## 2015-07-04 NOTE — Patient Instructions (Signed)
  Please complete the assigned speech therapy homework and return it to your next session.  

## 2015-07-04 NOTE — Therapy (Signed)
[facility] [address] [place], [place], [address] Phone: [phone]   Fax:  [phone]  Speech Language Pathology Treatment  Patient Details  Name: Ashley [name] MRN: [id number] Date of Birth: 07-20-1976 Referring Provider:  Marton Redwood, MD  Encounter Date: 07/04/2015      End of Session - 07/04/15 1534    Visit Number 2   Number of Visits 24   Date for SLP Re-Evaluation 09/12/15   Authorization - Visit Number 1   Authorization - Number of Visits 30   SLP Start Time 1450   SLP Stop Time  1531   SLP Time Calculation (min) 41 min   Activity Tolerance Patient tolerated treatment well      Past Medical History  Diagnosis Date  . Anxiety   . ICH (intracerebral hemorrhage)     Past Surgical History  Procedure Laterality Date  . Dilation and curettage of uterus      There were no vitals filed for this visit.  Visit Diagnosis: Cognitive communication deficit      Subjective Assessment - 07/04/15 1459    Subjective "I liked it." re: homework               ADULT SLP TREATMENT - 07/04/15 1500    General Information   Behavior/Cognition Alert;Cooperative;Pleasant mood   Treatment Provided   Treatment provided Cognitive-Linquistic   Pain Assessment   Pain Assessment No/denies pain   Cognitive-Linquistic Treatment   Treatment focused on Cognition   Skilled Treatment SLP targeted alternating attention during session today to improve pt's cognitive-linguistic skills. Extra time needed for simple written tasks. Emergent awareness in simple tasks was good (not excellent). In more detailed tasks, pt remembered to alternate attention. 100% success and 93% success on these alternated tasks.     Assessment / Recommendations / Plan   Plan Continue with current plan of care   Progression Toward Goals   Progression toward goals Progressing toward goals            SLP Short Term Goals - 07/04/15  1748    SLP SHORT TERM GOAL #1   Title pt will alternate attention between simple cognitive-linguistic tasks with 85% success and WFL switch time over 3 sessions   Time 4   Period Weeks  or 12 visits, for all STGs   Status On-going   SLP SHORT TERM GOAL #2   Title pt will demo verbal safety awareness related to deficits   Time 4   Period Weeks   Status On-going   SLP SHORT TERM GOAL #3   Title pt will complete mod complex time, money, sequencing, calendar problems 95% with modified independence (self-correction)   Time 4   Period Weeks   Status On-going          SLP Long Term Goals - 07/04/15 1749    SLP LONG TERM GOAL #1   Title pt will divide attention between two simple cognitive linguistic tasks 90% each task   Time 8   Period Weeks  or 24 visits, for all LTGs   Status On-going   SLP LONG TERM GOAL #2   Title pt will demo anticipatory awareness by using compensatory measures for memory and/or decr'd attention x1 in a session over 4 sessions   Time 8   Period Weeks   Status On-going   SLP LONG TERM GOAL #3   Title pt will demo executive function in a simple task with rare min A  Time 8   Period Weeks   Status On-going          Plan - 07/04/15 1535    Clinical Impression Statement Pt with good alternating attention with simple written tasks and conversation, and two written tasks. Furhter work needs to be done with auditory (time, money, Arts development officer) and written tasks to alternate between them. Homework was given for simple divided attentino.   Speech Therapy Frequency 2x / week  pt/husband decided on twice a week    Duration --  8 weeks or 16 more visits   Treatment/Interventions Cognitive reorganization;SLP instruction and feedback;Compensatory strategies;Internal/external aids;Environmental controls;Patient/family education;Functional tasks;Cueing hierarchy;Language facilitation   Potential to Achieve Goals Good        Problem List Patient Active Problem List    Diagnosis Date Noted  . Nontraumatic cortical hemorrhage of cerebral hemisphere   . Seizure   . Seizures   . Adjustment disorder with depressed mood   . Contracture of muscle ankle and foot 06/11/2015  . Hemiplga fol ntrm intcrbl hemor aff right dominant side 06/06/2015  . Left-sided intracerebral hemorrhage 06/04/2015  . Seizure disorder as sequela of cerebrovascular accident   . Seizure disorder 06/03/2015  . Sepsis 06/03/2015  . UTI (urinary tract infection) 06/03/2015  . Hypotension 06/02/2015  . Right spastic hemiparesis 05/28/2015  . 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 History of anxiety disorder 05/28/2015  . ICH (intracerebral hemorrhage)   . Seizure disorder, nonconvulsive, with status epilepticus   . Cerebral venous thrombosis of cortical vein   . Cytotoxic cerebral edema   . IVH (intraventricular hemorrhage)   . Term pregnancy 05/07/2015  . Spontaneous vaginal delivery 05/07/2015    [facility] , [place], [place]  07/04/2015, 5:49 PM  [facility] [address] [place], [place], [address] Phone: [phone]   Fax:  [phone]

## 2015-07-05 ENCOUNTER — Ambulatory Visit: Payer: BLUE CROSS/BLUE SHIELD

## 2015-07-05 ENCOUNTER — Ambulatory Visit: Payer: BLUE CROSS/BLUE SHIELD | Admitting: Occupational Therapy

## 2015-07-05 DIAGNOSIS — I69219 Unspecified symptoms and signs involving cognitive functions following other nontraumatic intracranial hemorrhage: Secondary | ICD-10-CM

## 2015-07-05 DIAGNOSIS — R278 Other lack of coordination: Secondary | ICD-10-CM

## 2015-07-05 DIAGNOSIS — R269 Unspecified abnormalities of gait and mobility: Secondary | ICD-10-CM | POA: Diagnosis not present

## 2015-07-05 DIAGNOSIS — R29898 Other symptoms and signs involving the musculoskeletal system: Secondary | ICD-10-CM

## 2015-07-05 DIAGNOSIS — R41841 Cognitive communication deficit: Secondary | ICD-10-CM

## 2015-07-05 DIAGNOSIS — IMO0002 Reserved for concepts with insufficient information to code with codable children: Secondary | ICD-10-CM

## 2015-07-05 NOTE — Therapy (Signed)
[facility] [address] [place], [place], [address] Phone: [phone]   Fax:  [phone]  Occupational Therapy Treatment  Patient Details  Name: Ashley [name] MRN: [id number] Date of Birth: 06/11/1976 Referring Provider:  Marton Redwood, MD  Encounter Date: 07/05/2015      OT End of Session - 07/05/15 1712    Visit Number 3   Number of Visits 17   Date for OT Re-Evaluation 08/29/15   Authorization Type BCBS   Authorization Time Period 30 visit PT/ OT combined, pt/ husband plan to self pay if needed after benefits are used   Authorization - Visit Number 1   Authorization - Number of Visits 15   OT Start Time 1020   OT Stop Time 1100   OT Time Calculation (min) 40 min   Activity Tolerance Patient tolerated treatment well   Behavior During Therapy [facility] for tasks assessed/performed      Past Medical History  Diagnosis Date  . Anxiety   . ICH (intracerebral hemorrhage)     Past Surgical History  Procedure Laterality Date  . Dilation and curettage of uterus      There were no vitals filed for this visit.  Visit Diagnosis:  Weakness due to cerebrovascular accident  Cognitive deficits following oth ntrm intcrn hemorrhage  Coordination impairment      Subjective Assessment - 07/05/15 1028    Pertinent History see epic snapshot; pt with [facility] 2 weeks after deliverying her 3rd child   Patient Stated Goals to be able to use my R arm normally   Currently in Pain? No/denies        Treatment: Reviewed exercises from HEP issued at last visit, 2 sets of 12 reps each, min v.c. initally then pt. Returned demonstration. Pt demonstrated ability to divide attention between exercises and carrying on a conversation with min difficulty. Pt reports she has to concentrate more on what her RUE is doing now. Seated with trunk rotated to one side, A-P tilts while weightbearing through bilateral UEs  To each side, 10-20 reps  for improved trunk flexibility, wieghtbearing                      OT Education - 07/05/15 1707    Education provided Yes   Education Details reviewed previous HEP, added trunk exercise   Person(s) Educated Patient   Methods Explanation;Demonstration;Verbal cues;Handout   Comprehension Verbalized understanding;Returned demonstration          OT Short Term Goals - 07/03/15 1301    OT SHORT TERM GOAL #1   Title I with HEP.   Baseline due 07/28/15   Time 4   Period Weeks   Status On-going   OT SHORT TERM GOAL #2   Title Pt will demonstrate impoved fine motor coordinationas evidenced by decreasing RUE 9 hole peg test score to 35 secs or less.   Time 4   Period Weeks   Status On-going   OT SHORT TERM GOAL #3   Title Pt will perform all basic ADLs at a supervision level.   Time 4   Period Weeks   Status On-going   OT SHORT TERM GOAL #4   Title Pt will use RUE as an active assist for ADLS/IADLS at least 50 % of the time.   Time 4   Period Weeks   Status On-going   OT SHORT TERM GOAL #5   Title Pt will perform basic home management/ cooking at min  A level.   Time 4   Period Weeks   Status On-going           OT Long Term Goals - 07/03/15 1301    OT LONG TERM GOAL #1   Title Pt will perfom all basic ADLS modidied independently.   Baseline due 08/27/15   Time 8   Period Weeks   Status On-going   OT LONG TERM GOAL #2   Title Pt will perform basic home management/ cooking at a supervision level demonstrating good safety awareness.   Time 8   Period Weeks   Status On-going   OT LONG TERM GOAL #3   Title Pt will demonstrate ability to retrieve a 3 lbs weight at 120 x 5 reps without dropping with RUE   Time 8   Period Weeks   Status On-going   OT LONG TERM GOAL #4   Title Pt will resume use of RUE as dominant hand at least 75% of the time for ADLs/ IADLs.   Time 8   Period Weeks   Status On-going   OT LONG TERM GOAL #5   Title Pt will demonstrate  ability to perform a physical and cognitive task simultaneously with 80% or better accuracy.   Time 8   Period Weeks   Status On-going   OT LONG TERM GOAL #6   Title Pt will demonstrate improved fine motor coordination as evidenced by decreasing RUE 9 hole peg test score to 30 secs or less.   Time 8   Period Weeks   Status On-going               Plan - 07/05/15 1709    Clinical Impression Statement Pt is progressing towards goals with imporving RUE functional use.   Rehab Potential Good   OT Frequency 2x / week   OT Duration 8 weeks   OT Treatment/Interventions Self-care/ADL training;Therapeutic exercise;Patient/family education;Balance training;Ultrasound;Neuromuscular education;Manual Therapy;Splinting;Therapeutic exercises;Energy conservation;Parrafin;DME and/or AE instruction;Therapeutic activities;Cognitive remediation/compensation;Gait Training;Fluidtherapy;Electrical Stimulation;Moist Heat;Contrast Bath;Passive range of motion;Visual/perceptual remediation/compensation   Plan progress HE with functional use / coordination ex   OT Home Exercise Plan issued: ball ex in supine, trunk for A-p tilts with trunk rotation(07/05/15)   Consulted and Agree with Plan of Care Patient        Problem List Patient Active Problem List   Diagnosis Date Noted  . Nontraumatic cortical hemorrhage of cerebral hemisphere   . Seizure   . Seizures   . Adjustment disorder with depressed mood   . Contracture of muscle ankle and foot 06/11/2015  . Hemiplga fol ntrm intcrbl hemor aff right dominant side 06/06/2015  . Left-sided intracerebral hemorrhage 06/04/2015  . Seizure disorder as sequela of cerebrovascular accident   . Seizure disorder 06/03/2015  . Sepsis 06/03/2015  . UTI (urinary tract infection) 06/03/2015  . Hypotension 06/02/2015  . Right spastic hemiparesis 05/28/2015  . 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 History of anxiety disorder  05/28/2015  . ICH (intracerebral hemorrhage)   . Seizure disorder, nonconvulsive, with status epilepticus   . Cerebral venous thrombosis of cortical vein   . Cytotoxic cerebral edema   . IVH (intraventricular hemorrhage)   . Term pregnancy 05/07/2015  . Spontaneous vaginal delivery 05/07/2015    RINE,KATHRYN 07/05/2015, 5:20 PM Theone Murdoch, OTR/L [phone] Phone: [phone] 5:20 PM 09/22/2016[facility] [address] [place], [place], [address] Phone: [phone]   Fax:  [phone]

## 2015-07-05 NOTE — Patient Instructions (Signed)
  Please complete the assigned speech therapy homework and return it to your next session.  

## 2015-07-05 NOTE — Patient Instructions (Signed)
Perform stretches every day, try to relax leg prior to stretching.  ANKLE: Dorsiflexion (Band)   Sit at edge of surface. NO BAND for now. Keeping heel on floor, raise toes of right foot. Hold _2__ seconds.  _10__ reps per set, _2__ sets per day, _7__ days per week  Copyright  VHI. All rights reserved.    ANKLE: Eversion, Right foot only.   Sit at edge of surface, feet on floor. Raise toes of right foot up and move them away from body. Do not move hips or knees. _10__ reps per set, _2__ sets per day, _7__ days per week. Have someone assist you as needed. Try not to move left foot.  Copyright  VHI. All rights reserved.   FUNCTIONAL MOBILITY: Squat   Stance: shoulder-width on floor with a chair behind you, place 2 inch block under Left leg to increase weight bearing on Right leg. Bend hips and knees. Keep back straight. Do not allow knees to bend past toes. Squeeze glutes and quads to stand. _10__ reps per set, _2__ sets per day, _3-4__ days per week  Copyright  VHI. All rights reserved.   Hamstrings   Lie on stomach with no weight around right ankle. Bring heel towards your buttom, pointing toes toward knee. Do not bend hips. Hold __2__ seconds. Slowly lower foot down (have someone assist you as needed to control foot going down). Repeat __10__ times. Do __2__ sessions per day. Perform 3-4 days a week. CAUTION: Move slowly.  Copyright  VHI. All rights reserved.    Bridge   Lie back, legs bent. Inhale, pressing hips up, while tucking in your stomach and hold for 2 seconds. Keeping ribs in, lengthen lower back. Exhale, rolling down along spine from top. Repeat __10__ times. Do __1__ sessions per day.  Copyright  VHI. All rights reserved.

## 2015-07-05 NOTE — Patient Instructions (Signed)
Sit on the edge of one of your children's beds with feet on the ground, turn to one side and place hands in front of you, rock hips forwards and backwards, 10 reps to each side, you should feel a gentle stretch in your trunk and arms.

## 2015-07-05 NOTE — Therapy (Signed)
[facility] [address] [place], [place], [address] Phone: [phone]   Fax:  [phone]  Physical Therapy Treatment  Patient Details  Name: Ashley [name] MRN: [id number] Date of Birth: [birth year] Referring Provider:  Marton Redwood, MD  Encounter Date: 07/05/2015      PT End of Session - 07/05/15 1154    Visit Number 3   Number of Visits 17   Date for PT Re-Evaluation 08/28/15   Authorization Type BCBS (30 visits total for OT/PT). Pt and pt's husband stated they will pay out of pocket after visits are complete, if necessary.   Authorization - Visit Number 3   Authorization - Number of Visits 15   PT Start Time [phone]   PT Stop Time 1015   PT Time Calculation (min) 44 min   Equipment Utilized During Treatment Gait belt   Activity Tolerance Patient tolerated treatment well   Behavior During Therapy WFL for tasks assessed/performed      Past Medical History  Diagnosis Date  . Anxiety   . ICH (intracerebral hemorrhage)     Past Surgical History  Procedure Laterality Date  . Dilation and curettage of uterus      There were no vitals filed for this visit.  Visit Diagnosis:  Right leg weakness  Abnormality of gait      Subjective Assessment - 07/05/15 0936    Subjective Pt reported she fell yesterday while going to sit on the couch (trying to avoid sitting on her phone) and misplaced R foot. Pt reports she caught herself and denied hitting her head or other injuries.   Patient Stated Goals "Be as close to normal as much as possible and to go back to running" Get back to taking care of my kids, 65 weeks old and 55 and 31 years old   Currently in Pain? No/denies         Therex: Pt performed strengthening HEP with cues for technique and to improve eccentric control. Performed with min guard to S. Please see pt instructions for details.                        PT Education - 07/05/15 1154     Education provided Yes   Education Details Strengthening HEP, have someone assist as needed. Reviewed frequency of stretches.   Person(s) Educated Patient   Methods Explanation;Demonstration;Tactile cues;Verbal cues;Handout   Comprehension Verbalized understanding;Returned demonstration;Need further instruction          PT Short Term Goals - 07/05/15 1157    PT SHORT TERM GOAL #1   Title Pt will be IND in HEP to improve strength, balance, and endurance. Target date: 07/27/15.   Status On-going   PT SHORT TERM GOAL #2   Title Pt will improve gait speed to >/=1.69ft/sec to decrease falls risk. Target date: 07/27/15.   Status On-going   PT SHORT TERM GOAL #3   Title Pt will ambulate 300' with LRAD, over even terrain, at MOD I level to improve functional mobility. Target date: 07/27/15.   Status On-going   PT SHORT TERM GOAL #4   Title Pt will perform TUG in </=13.5 seconds with LRAD to decr. falls risk. Target date: 07/27/15.   Status On-going   PT SHORT TERM GOAL #5   Title Perform BERG and write STG and LTG as appropriate. Target date: 07/27/15.   Status Achieved   Additional Short Term Goals   Additional Short Term Goals  Yes   PT SHORT TERM GOAL #6   Title Pt will improve BERG to >/=51/56 to decr. falls risk. Target date: 07/27/15.   Status New           PT Long Term Goals - 07/05/15 1157    PT LONG TERM GOAL #1   Title Pt will ambulate 1000' over even/uneven terrain, with LRAD, at MOD I level to improve functional mobiliity. Target date: 08/24/15.   Status On-going   PT LONG TERM GOAL #2   Title Pt will amb. 300' over even terrain without an AD, IND, to safely amb. in home. Target date: 08/24/15.   Status On-going   PT LONG TERM GOAL #3   Title Pt will improve gait speed to >/=2.68ft/sec, with LRAD, to amb. safely in the community. Target date: 08/24/15.   Status On-going   PT LONG TERM GOAL #4   Title Pt will verbalize plans to join a fitness center upon d/c from  PT to continue to maintain strength and endurance gains made during PT. Target date: 08/24/15.   Status On-going   PT LONG TERM GOAL #5   Title Pt improve BERG score to >/=55/56 to decr. falls risk. Target date: 08/24/15.   Status New               Plan - 07/05/15 1155    Clinical Impression Statement Pt required assist during prone R hamstring curl to improve eccentric control. PT placed 2" block under L LE to improve lateral wt. shifting onto R LE during squats.  Pt did not experience R genu recurvatum while performing terminal R knee ext, with tactile cues and VCs. Pt would continue to benefit from skilled PT to improve safety during functional mobility.   Pt will benefit from skilled therapeutic intervention in order to improve on the following deficits Abnormal gait;Decreased endurance;Impaired sensation;Decreased knowledge of precautions;Decreased activity tolerance;Decreased knowledge of use of DME;Decreased strength;Impaired UE functional use;Impaired tone;Decreased balance;Decreased mobility;Decreased cognition;Decreased range of motion;Decreased safety awareness;Decreased coordination;Impaired flexibility   Rehab Potential Good   Clinical Impairments Affecting Rehab Potential Seizures which may limit intensity of PT   PT Frequency 2x / week   PT Duration 8 weeks   PT Treatment/Interventions ADLs/Self Care Home Management;Neuromuscular re-education;Cognitive remediation;Biofeedback;DME Instruction;Gait training;Stair training;Canalith Repostioning;Patient/family education;Orthotic Fit/Training;Balance training;Therapeutic exercise;Manual techniques;Therapeutic activities;Vestibular   PT Next Visit Plan Gait with wedge in R to trial for hyperextension control and add balance HEP and hip abductor HEP   PT Home Exercise Plan Flexibility and strengthening HEP   Consulted and Agree with Plan of Care Patient        Problem List Patient Active Problem List   Diagnosis Date Noted   . Nontraumatic cortical hemorrhage of cerebral hemisphere   . Seizure   . Seizures   . Adjustment disorder with depressed mood   . Contracture of muscle ankle and foot 06/11/2015  . Hemiplga fol ntrm intcrbl hemor aff right dominant side 06/06/2015  . Left-sided intracerebral hemorrhage 06/04/2015  . Seizure disorder as sequela of cerebrovascular accident   . Seizure disorder 06/03/2015  . Sepsis 06/03/2015  . UTI (urinary tract infection) 06/03/2015  . Hypotension 06/02/2015  . Right spastic hemiparesis 05/28/2015  . 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 History of anxiety disorder 05/28/2015  . ICH (intracerebral hemorrhage)   . Seizure disorder, nonconvulsive, with status epilepticus   . Cerebral venous thrombosis of cortical vein   . Cytotoxic cerebral edema   . IVH (intraventricular hemorrhage)   .  Term pregnancy 05/07/2015  . Spontaneous vaginal delivery 05/07/2015    Miller,Jennifer L 07/05/2015, 11:59 AM  [facility] [address] [place], [place], [address] Phone: [phone]   Fax:  [phone]     Geoffry Paradise, PT,DPT 07/05/2015 11:59 AM Phone: [phone] Fax: [phone]

## 2015-07-05 NOTE — Therapy (Signed)
[facility] [address] [place], [place], [address] Phone: [phone]   Fax:  [phone]  Speech Language Pathology Treatment  Patient Details  Name: Ashley [name] MRN: [id number] Date of Birth: 1976-07-02 Referring Provider:  Marton Redwood, MD  Encounter Date: 07/05/2015      End of Session - 07/05/15 1207    Visit Number 3   Number of Visits 24   Date for SLP Re-Evaluation 09/12/15   Authorization - Visit Number 2   Authorization - Number of Visits 30   SLP Start Time 1103   SLP Stop Time  1146   SLP Time Calculation (min) 43 min   Activity Tolerance Patient tolerated treatment well      Past Medical History  Diagnosis Date  . Anxiety   . ICH (intracerebral hemorrhage)     Past Surgical History  Procedure Laterality Date  . Dilation and curettage of uterus      There were no vitals filed for this visit.  Visit Diagnosis: Cognitive communication deficit      Subjective Assessment - 07/05/15 1110    Subjective "I can't lie, I didn't watch TV - it was crazy at my house last night." (re: homework for divided attention)               ADULT SLP TREATMENT - 07/05/15 1115    General Information   Behavior/Cognition Alert;Cooperative;Pleasant mood   Treatment Provided   Treatment provided Cognitive-Linquistic   Pain Assessment   Pain Assessment No/denies pain   Cognitive-Linquistic Treatment   Treatment focused on Cognition   Skilled Treatment Alternating attention targeted during today's session, mod complex written task and simple conversation completed with 88% success with written task. Due to patient's success at alternating attention, divided attention was targeted next in the same two tasks (pt was instructed as such) and pt responses were 25% divided attention and 75% alternating.    Assessment / Recommendations / Plan   Plan Continue with current plan of care   Progression Toward  Goals   Progression toward goals Progressing toward goals          SLP Education - 07/05/15 1206    Education provided Yes   Education Details differences between divided and alternating attention   Person(s) Educated Patient   Methods Explanation   Comprehension Verbalized understanding          SLP Short Term Goals - 07/05/15 1209    SLP SHORT TERM GOAL #1   Title pt will alternate attention between simple cognitive-linguistic tasks with 85% success and WFL switch time over 3 sessions   Time 4   Period Weeks  or 12 visits, for all STGs   Status On-going   SLP SHORT TERM GOAL #2   Title pt will demo verbal safety awareness related to deficits   Time 4   Period Weeks   Status On-going   SLP SHORT TERM GOAL #3   Title pt will complete mod complex time, money, sequencing, calendar problems 95% with modified independence (self-correction)   Time 4   Period Weeks   Status On-going   SLP SHORT TERM GOAL #4   Title pt will demo divided attention in two simple tasks 80% of the time with rare min A   Time 4   Period Weeks   Status New          SLP Long Term Goals - 07/05/15 1210    SLP LONG TERM GOAL #1  Title pt will divide attention between two mod complex cognitive linguistic tasks 90% each task   Time 8   Period Weeks  or 24 visits, for all LTGs   Status Revised   SLP LONG TERM GOAL #2   Title pt will demo anticipatory awareness by using compensatory measures for memory and/or decr'd attention x1 in a session over 4 sessions   Time 8   Period Weeks   Status On-going   SLP LONG TERM GOAL #3   Title pt will demo executive function in a simple task with rare min A   Time 8   Period Weeks   Status On-going          Plan - 07/05/15 1207    Clinical Impression Statement Pt with good alternating attention with mod complex written tasks and conversation, however divided attention tasks were completed with alternating attention. Pt requires skilled ST to cont  to improve cognitive liguistic tasks to return to workforce, and to reduce caregiver burden.   Speech Therapy Frequency 2x / week   Duration --  8 weeks or 14 more visits   Treatment/Interventions Cognitive reorganization;SLP instruction and feedback;Compensatory strategies;Internal/external aids;Environmental controls;Patient/family education;Functional tasks;Cueing hierarchy;Language facilitation   Potential to Achieve Goals Good        Problem List Patient Active Problem List   Diagnosis Date Noted  . Nontraumatic cortical hemorrhage of cerebral hemisphere   . Seizure   . Seizures   . Adjustment disorder with depressed mood   . Contracture of muscle ankle and foot 06/11/2015  . Hemiplga fol ntrm intcrbl hemor aff right dominant side 06/06/2015  . Left-sided intracerebral hemorrhage 06/04/2015  . Seizure disorder as sequela of cerebrovascular accident   . Seizure disorder 06/03/2015  . Sepsis 06/03/2015  . UTI (urinary tract infection) 06/03/2015  . Hypotension 06/02/2015  . Right spastic hemiparesis 05/28/2015  . 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 History of anxiety disorder 05/28/2015  . ICH (intracerebral hemorrhage)   . Seizure disorder, nonconvulsive, with status epilepticus   . Cerebral venous thrombosis of cortical vein   . Cytotoxic cerebral edema   . IVH (intraventricular hemorrhage)   . Term pregnancy 05/07/2015  . Spontaneous vaginal delivery 05/07/2015    [facility] , [place], [place]  07/05/2015, 12:11 PM  [facility] [address] [place], [place], [address] Phone: [phone]   Fax:  [phone]

## 2015-07-11 ENCOUNTER — Ambulatory Visit: Payer: BLUE CROSS/BLUE SHIELD

## 2015-07-11 ENCOUNTER — Ambulatory Visit: Payer: BLUE CROSS/BLUE SHIELD | Admitting: Physical Therapy

## 2015-07-11 ENCOUNTER — Ambulatory Visit: Payer: BLUE CROSS/BLUE SHIELD | Admitting: Occupational Therapy

## 2015-07-11 DIAGNOSIS — IMO0002 Reserved for concepts with insufficient information to code with codable children: Secondary | ICD-10-CM

## 2015-07-11 DIAGNOSIS — I69219 Unspecified symptoms and signs involving cognitive functions following other nontraumatic intracranial hemorrhage: Secondary | ICD-10-CM

## 2015-07-11 DIAGNOSIS — R29898 Other symptoms and signs involving the musculoskeletal system: Secondary | ICD-10-CM

## 2015-07-11 DIAGNOSIS — R269 Unspecified abnormalities of gait and mobility: Secondary | ICD-10-CM | POA: Diagnosis not present

## 2015-07-11 DIAGNOSIS — R278 Other lack of coordination: Secondary | ICD-10-CM

## 2015-07-11 DIAGNOSIS — R41841 Cognitive communication deficit: Secondary | ICD-10-CM

## 2015-07-11 NOTE — Therapy (Signed)
[facility] [address] [place], [place], [address] Phone: [phone]   Fax:  [phone]  Speech Language Pathology Treatment  Patient Details  Name: Ashley [name] MRN: [id number] Date of Birth: November 05, 1975 Referring Provider:  Marton Redwood, MD  Encounter Date: 07/11/2015      End of Session - 07/11/15 1312    Visit Number 4   Number of Visits 24   Date for SLP Re-Evaluation 09/12/15   Authorization - Visit Number 3   Authorization - Number of Visits 75   SLP Start Time 0809   SLP Stop Time  0846   SLP Time Calculation (min) 37 min   Activity Tolerance Patient tolerated treatment well      Past Medical History  Diagnosis Date  . Anxiety   . ICH (intracerebral hemorrhage)     Past Surgical History  Procedure Laterality Date  . Dilation and curettage of uterus      There were no vitals filed for this visit.  Visit Diagnosis: Cognitive communication deficit      Subjective Assessment - 07/11/15 1312    Subjective Pt was approx 10 minutes late for session today. "Sorry, we had to change [employer] - he spitup on himself!"               ADULT SLP TREATMENT - 07/11/15 0820    General Information   Behavior/Cognition Alert;Cooperative;Pleasant mood   Treatment Provided   Treatment provided Cognitive-Linquistic   Pain Assessment   Pain Assessment 0-10   Pain Score 5    Pain Location tail bone   Pain Descriptors / Indicators Sore   Pain Intervention(s) Monitored during session   Cognitive-Linquistic Treatment   Treatment focused on Cognition   Skilled Treatment Divided attention with auditory and written stimuli. Conversation with pt and written task; 70% alternating, 30% divided. Written task and commercials tracking resulted in pt requiring cues usually to write commercials during a written task. Pt acknowledged her difficulty but did not change success after acknowledging.   Assessment /  Recommendations / Plan   Plan Continue with current plan of care   Progression Toward Goals   Progression toward goals Progressing toward goals            SLP Short Term Goals - 07/11/15 1316    SLP SHORT TERM GOAL #1   Title pt will alternate attention between simple cognitive-linguistic tasks with 85% success and WFL switch time over 3 sessions   Time 3   Period Weeks  or 12 visits, for all STGs   Status On-going   SLP SHORT TERM GOAL #2   Title pt will demo verbal safety awareness related to deficits   Time 3   Period Weeks   Status On-going   SLP SHORT TERM GOAL #3   Title pt will complete mod complex time, money, sequencing, calendar problems 95% with modified independence (self-correction)   Time 3   Period Weeks   Status On-going   SLP SHORT TERM GOAL #4   Title pt will demo divided attention in two simple tasks 80% of the time with rare min A   Time 3   Period Weeks   Status New          SLP Long Term Goals - 07/11/15 1316    SLP LONG TERM GOAL #1   Title pt will divide attention between two mod complex cognitive linguistic tasks 90% each task   Time 7   Period Weeks  Status On-going   SLP LONG TERM GOAL #2   Title pt will demo anticipatory awareness by using compensatory measures for memory and/or decr'd attention x1 in a session over 4 sessions   Time 7   Period Weeks   Status On-going   SLP LONG TERM GOAL #3   Title pt will demo executive function in a simple task with rare min A   Time 7   Period Weeks   Status On-going          Plan - 07/11/15 1314    Clinical Impression Statement Divided attention tasks were completed with alternating attention, or with selective attention. Pt requires skilled ST to cont to improve cognitive liguistic tasks to return to workforce, and to reduce caregiver burden.   Speech Therapy Frequency 2x / week   Duration --  7 weeks or 13 more visits   Treatment/Interventions Cognitive reorganization;SLP instruction  and feedback;Compensatory strategies;Internal/external aids;Environmental controls;Patient/family education;Functional tasks;Cueing hierarchy;Language facilitation   Potential to Achieve Goals Good   Potential Considerations Ability to learn/carryover information        Problem List Patient Active Problem List   Diagnosis Date Noted  . Nontraumatic cortical hemorrhage of cerebral hemisphere   . Seizure   . Seizures   . Adjustment disorder with depressed mood   . Contracture of muscle ankle and foot 06/11/2015  . Hemiplga fol ntrm intcrbl hemor aff right dominant side 06/06/2015  . Left-sided intracerebral hemorrhage 06/04/2015  . Seizure disorder as sequela of cerebrovascular accident   . Seizure disorder 06/03/2015  . Sepsis 06/03/2015  . UTI (urinary tract infection) 06/03/2015  . Hypotension 06/02/2015  . Right spastic hemiparesis 05/28/2015  . 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 History of anxiety disorder 05/28/2015  . ICH (intracerebral hemorrhage)   . Seizure disorder, nonconvulsive, with status epilepticus   . Cerebral venous thrombosis of cortical vein   . Cytotoxic cerebral edema   . IVH (intraventricular hemorrhage)   . Term pregnancy 05/07/2015  . Spontaneous vaginal delivery 05/07/2015    [facility] , [place], [place]  07/11/2015, 1:17 PM  [facility] [address] [place], [place], [address] Phone: [phone]   Fax:  [phone]

## 2015-07-11 NOTE — Therapy (Signed)
[facility] [address] [place], [place], [address] Phone: [phone]   Fax:  [phone]  Occupational Therapy Treatment  Patient Details  Name: Ashley [name] MRN: [id number] Date of Birth: 1976-04-30 Referring Provider:  Marton Redwood, MD  Encounter Date: 07/11/2015      OT End of Session - 07/11/15 0923    Visit Number 4   Number of Visits 17   Date for OT Re-Evaluation 08/29/15   Authorization Type BCBS   Authorization Time Period 30 visit PT/ OT combined, pt/ husband plan to self pay if needed after benefits are used   Authorization - Visit Number 4   Authorization - Number of Visits 15   OT Start Time 0850   OT Stop Time 0930   OT Time Calculation (min) 40 min   Activity Tolerance Patient tolerated treatment well   Behavior During Therapy [facility] for tasks assessed/performed      Past Medical History  Diagnosis Date  . Anxiety   . ICH (intracerebral hemorrhage)     Past Surgical History  Procedure Laterality Date  . Dilation and curettage of uterus      There were no vitals filed for this visit.  Visit Diagnosis:  Cognitive deficits following oth ntrm intcrn hemorrhage  Weakness due to cerebrovascular accident  Coordination impairment      Subjective Assessment - 07/11/15 0900    Subjective  Pt reports a fall in the bathroom at home   Pertinent History see epic snapshot; pt with [facility] 2 weeks after deliverying her 3rd child   Patient Stated Goals to be able to use my R arm normally   Currently in Pain? Yes   Pain Score 4    Pain Location Coccyx   Pain Descriptors / Indicators Aching   Pain Type Acute pain   Pain Onset Yesterday   Aggravating Factors  fall   Pain Relieving Factors repositioning   Multiple Pain Sites No       Treatment: Neuro re-ed: Quadraped on mat for cat/ cow, followed by lifting alternate UE, min facilitation by therapist. Prone, scapular stability exercises for  scapular retraction and shoulder extension, x 10 reps with folded towel under right shoulder Seated edge of mat anterior /posterior weightshifts with trunk rotation, weightbearing through bilateral UE's. Closed chain shoulder flexion with ball seated x 10 reps, min v.c. Theraputic activities: Pt was instructed in coordination HEP.                         OT Short Term Goals - 07/03/15 1301    OT SHORT TERM GOAL #1   Title I with HEP.   Baseline due 07/28/15   Time 4   Period Weeks   Status On-going   OT SHORT TERM GOAL #2   Title Pt will demonstrate impoved fine motor coordinationas evidenced by decreasing RUE 9 hole peg test score to 35 secs or less.   Time 4   Period Weeks   Status On-going   OT SHORT TERM GOAL #3   Title Pt will perform all basic ADLs at a supervision level.   Time 4   Period Weeks   Status On-going   OT SHORT TERM GOAL #4   Title Pt will use RUE as an active assist for ADLS/IADLS at least 50 % of the time.   Time 4   Period Weeks   Status On-going   OT SHORT TERM GOAL #5  Title Pt will perform basic home management/ cooking at min A level.   Time 4   Period Weeks   Status On-going           OT Long Term Goals - 07/03/15 1301    OT LONG TERM GOAL #1   Title Pt will perfom all basic ADLS modidied independently.   Baseline due 08/27/15   Time 8   Period Weeks   Status On-going   OT LONG TERM GOAL #2   Title Pt will perform basic home management/ cooking at a supervision level demonstrating good safety awareness.   Time 8   Period Weeks   Status On-going   OT LONG TERM GOAL #3   Title Pt will demonstrate ability to retrieve a 3 lbs weight at 120 x 5 reps without dropping with RUE   Time 8   Period Weeks   Status On-going   OT LONG TERM GOAL #4   Title Pt will resume use of RUE as dominant hand at least 75% of the time for ADLs/ IADLs.   Time 8   Period Weeks   Status On-going   OT LONG TERM GOAL #5   Title Pt will  demonstrate ability to perform a physical and cognitive task simultaneously with 80% or better accuracy.   Time 8   Period Weeks   Status On-going   OT LONG TERM GOAL #6   Title Pt will demonstrate improved fine motor coordination as evidenced by decreasing RUE 9 hole peg test score to 30 secs or less.   Time 8   Period Weeks   Status On-going               Plan - 07/11/15 1245    Clinical Impression Statement Pt demonstrates improving RUE strength and fine motor coordiantion.   Pt will benefit from skilled therapeutic intervention in order to improve on the following deficits (Retired) Abnormal gait;Decreased coordination;Decreased range of motion;Difficulty walking;Impaired flexibility;Decreased endurance;Decreased safety awareness;Increased edema;Impaired tone;Decreased knowledge of precautions;Decreased activity tolerance;Decreased balance;Decreased knowledge of use of DME;Pain;Impaired UE functional use;Impaired vision/preception;Decreased cognition;Decreased mobility;Decreased strength   Rehab Potential Good   OT Frequency 2x / week   OT Duration 8 weeks   Plan continue to progress RUE functional use and coordination   OT Home Exercise Plan issued: ball ex in supine, trunk for A-p tilts with trunk rotation(07/05/15)   Consulted and Agree with Plan of Care Patient        Problem List Patient Active Problem List   Diagnosis Date Noted  . Nontraumatic cortical hemorrhage of cerebral hemisphere   . Seizure   . Seizures   . Adjustment disorder with depressed mood   . Contracture of muscle ankle and foot 06/11/2015  . Hemiplga fol ntrm intcrbl hemor aff right dominant side 06/06/2015  . Left-sided intracerebral hemorrhage 06/04/2015  . Seizure disorder as sequela of cerebrovascular accident   . Seizure disorder 06/03/2015  . Sepsis 06/03/2015  . UTI (urinary tract infection) 06/03/2015  . Hypotension 06/02/2015  . Right spastic hemiparesis 05/28/2015  . Aphasia  following nontraumatic intracerebral hemorrhage 05/28/2015  . History of anxiety disorder 05/28/2015  . ICH (intracerebral hemorrhage)   . Seizure disorder, nonconvulsive, with status epilepticus   . Cerebral venous thrombosis of cortical vein   . Cytotoxic cerebral edema   . IVH (intraventricular hemorrhage)   . Term pregnancy 05/07/2015  . Spontaneous vaginal delivery 05/07/2015    RINE,KATHRYN 07/11/2015, 12:58 PM Theone Murdoch, OTR/L [phone] Phone: [phone] 12:58 PM 07/11/2015 [facility] [address] [place], [place], [address] Phone: [phone]   Fax:  [phone]

## 2015-07-11 NOTE — Patient Instructions (Signed)
  Please complete the assigned speech therapy homework and return it to your next session.  

## 2015-07-11 NOTE — Patient Instructions (Signed)
Abduction: Clam (Eccentric) - Side-Lying   Lie on left side with knees bent. Lift top knee, keeping feet together. Keep trunk steady. Slowly lower for 3-5 seconds. 10 reps per set, 2sets per day.   Copyright  VHI. All rights reserved.  Hip Abduction / Adduction: with Knee Flexion (Supine)   With both knees bent and yellow band around knees , gently lower right knee to side and return. Repeat 10 times per set. Do 2 sets per session. Do 2 sessions per day.  http://orth.exer.us/683   Copyright  VHI. All rights reserved.

## 2015-07-11 NOTE — Patient Instructions (Signed)
  Coordination Activities  Perform the following activities for  20 minutes 1-2 times per day with right hand(s).   Rotate ball in fingertips (clockwise and counter-clockwise).  Toss ball between hands.  Toss ball in air and catch with the same hand.  Flip cards 1 at a time as fast as you can.  Deal cards with your thumb (Hold deck in hand and push card off top with thumb).  Pick up coins, buttons, marbles, dried beans/pasta of different sizes and place in container.  Pick up coins and place in container or coin bank.  Pick up coins and stack.  Pick up coins one at a time until you get 5-10 in your hand, then move coins from palm to fingertips to stack one at a time.  Twirl pen between fingers.  Practice writing and/or typing.

## 2015-07-12 ENCOUNTER — Ambulatory Visit: Payer: BLUE CROSS/BLUE SHIELD | Admitting: Occupational Therapy

## 2015-07-12 ENCOUNTER — Ambulatory Visit: Payer: BLUE CROSS/BLUE SHIELD | Admitting: Physical Therapy

## 2015-07-12 ENCOUNTER — Ambulatory Visit: Payer: BLUE CROSS/BLUE SHIELD

## 2015-07-12 ENCOUNTER — Encounter: Payer: Self-pay | Admitting: Occupational Therapy

## 2015-07-12 DIAGNOSIS — I69219 Unspecified symptoms and signs involving cognitive functions following other nontraumatic intracranial hemorrhage: Secondary | ICD-10-CM

## 2015-07-12 DIAGNOSIS — IMO0002 Reserved for concepts with insufficient information to code with codable children: Secondary | ICD-10-CM

## 2015-07-12 DIAGNOSIS — R269 Unspecified abnormalities of gait and mobility: Secondary | ICD-10-CM

## 2015-07-12 DIAGNOSIS — R278 Other lack of coordination: Secondary | ICD-10-CM

## 2015-07-12 DIAGNOSIS — R41841 Cognitive communication deficit: Secondary | ICD-10-CM

## 2015-07-12 NOTE — Patient Instructions (Signed)
  Please complete the assigned speech therapy homework and return it to your next session.  

## 2015-07-12 NOTE — Therapy (Signed)
[facility] [address] [place], [place], [address] Phone: [phone]   Fax:  [phone]  Physical Therapy Treatment  Patient Details  Name: Ashley [name] MRN: [id number] Date of Birth: 06-07-1976 Referring Provider:  Marton Redwood, MD  Encounter Date: 07/11/2015      PT End of Session - 07/12/15 1611    Visit Number 4   Number of Visits 17   Date for PT Re-Evaluation 08/28/15   Authorization Type BC[employer] (30 visits total for OT/PT). Pt and pt's husband stated they will pay out of pocket after visits are complete, if necessary.   Authorization - Visit Number 4   Authorization - Number of Visits 15   PT Start Time [phone]   PT Stop Time 1016   PT Time Calculation (min) 42 min   Equipment Utilized During Treatment Gait belt   Activity Tolerance Patient tolerated treatment well   Behavior During Therapy WFL for tasks assessed/performed      Past Medical History  Diagnosis Date  . Anxiety   . ICH (intracerebral hemorrhage)     Past Surgical History  Procedure Laterality Date  . Dilation and curettage of uterus      There were no vitals filed for this visit.  Visit Diagnosis:  Abnormality of gait  Right leg weakness      Subjective Assessment - 07/12/15 1152    Subjective Pt reports fall while in bathroom trying to pick up clothes off floor and getting in a huryy.  Stats she's "sore" at R hip.  Denies hitting head.   Pertinent History Seizures, low BP   Patient Stated Goals "Be as close to normal as much as possible and to go back to running" Get back to taking care of my kids, 33 weeks old and 41 and 3 years old   Currently in Pain? No/denies  sore at coccyx             [facility] Adult PT Treatment/Exercise - 07/12/15 0001    Bed Mobility   Bed Mobility Rolling Right;Rolling Left;Supine to Sit;Sitting - Scoot to [employer] of Bed;Sit to Supine;Scooting to [facility]   Rolling Right 7: Independent   Rolling Left 7:  Independent   Right Sidelying to Sit 7: Independent   Supine to Sit 7: Independent   Sitting - Scoot to Edge of Bed 7: Independent   Sit to Supine 7: Independent   Scooting to [facility] 7: Independent   Transfers   Transfers Sit to Stand;Stand to Sit   Sit to Stand 6: Modified independent (Device/Increase time)   Stand to Sit 6: Modified independent (Device/Increase time)   Ambulation/Gait   Ambulation/Gait Yes   Ambulation/Gait Assistance 5: Supervision   Ambulation/Gait Assistance Details see comments   Ambulation Distance (Feet) 110 Feet  multiple times   Assistive device Small based quad cane   Gait Pattern Step-to pattern;Decreased step length - left;Decreased dorsiflexion - right;Decreased hip/knee flexion - right;Decreased stride length;Ataxic;Right genu recurvatum   Ambulation Surface Level;Indoor   Gait Comments trialed wedge in R shoe with R AFO which improved hyperextension but did have rare hyperextension with AFO with fatigue.  Trialed posterior ottobach with little change from pt's current AFO.  Instructed pt in wear of wedge and purpose of wedge.   Knee/Hip Exercises: Supine   Hip Adduction Isometric Both;10 reps   Bridges Both;10 reps   Other Supine Knee/Hip Exercises hip abduction with yellow theraband x 10 with cues to maintain proper positioning  Knee/Hip Exercises: Sidelying   Clams L sidelying for RLE abduction x 10 with cues to maintain proper position   Knee/Hip Exercises: Prone   Hamstring Curl Limitations poor control   Ankle Exercises: Seated   Toe Raise 5 reps   Toe Raise Limitations poor active ROM on R-fatigues quickly   Other Seated Ankle Exercises unable to perform eversion on R                PT Education - 07/12/15 1602    Education provided Yes   Education Details HEP, purpose of heel wedge   Person(s) Educated Patient   Methods Explanation;Demonstration;Handout   Comprehension Verbalized understanding;Returned demonstration           PT Short Term Goals - 07/05/15 1157    PT SHORT TERM GOAL #1   Title Pt will be IND in HEP to improve strength, balance, and endurance. Target date: 07/27/15.   Status On-going   PT SHORT TERM GOAL #2   Title Pt will improve gait speed to >/=1.61ft/sec to decrease falls risk. Target date: 07/27/15.   Status On-going   PT SHORT TERM GOAL #3   Title Pt will ambulate 300' with LRAD, over even terrain, at MOD I level to improve functional mobility. Target date: 07/27/15.   Status On-going   PT SHORT TERM GOAL #4   Title Pt will perform TUG in </=13.5 seconds with LRAD to decr. falls risk. Target date: 07/27/15.   Status On-going   PT SHORT TERM GOAL #5   Title Perform BERG and write STG and LTG as appropriate. Target date: 07/27/15.   Status Achieved   Additional Short Term Goals   Additional Short Term Goals Yes   PT SHORT TERM GOAL #6   Title Pt will improve BERG to >/=51/56 to decr. falls risk. Target date: 07/27/15.   Status New           PT Long Term Goals - 07/05/15 1157    PT LONG TERM GOAL #1   Title Pt will ambulate 1000' over even/uneven terrain, with LRAD, at MOD I level to improve functional mobiliity. Target date: 08/24/15.   Status On-going   PT LONG TERM GOAL #2   Title Pt will amb. 300' over even terrain without an AD, IND, to safely amb. in home. Target date: 08/24/15.   Status On-going   PT LONG TERM GOAL #3   Title Pt will improve gait speed to >/=2.66ft/sec, with LRAD, to amb. safely in the community. Target date: 08/24/15.   Status On-going   PT LONG TERM GOAL #4   Title Pt will verbalize plans to join a fitness center upon d/c from PT to continue to maintain strength and endurance gains made during PT. Target date: 08/24/15.   Status On-going   PT LONG TERM GOAL #5   Title Pt improve BERG score to >/=55/56 to decr. falls risk. Target date: 08/24/15.   Status New               Plan - 07/12/15 1611    Clinical Impression Statement Pt with  decreased R knee genurecurvatum with heel wedge.  Continues with ataxia, decreased strength and decrease sensation in RLE.  Continue PT per POC.   Pt will benefit from skilled therapeutic intervention in order to improve on the following deficits Abnormal gait;Decreased endurance;Impaired sensation;Decreased knowledge of precautions;Decreased activity tolerance;Decreased knowledge of use of DME;Decreased strength;Impaired UE functional use;Impaired tone;Decreased balance;Decreased mobility;Decreased cognition;Decreased range of motion;Decreased safety awareness;Decreased coordination;Impaired  flexibility   Rehab Potential Good   Clinical Impairments Affecting Rehab Potential Seizures which may limit intensity of PT   PT Frequency 2x / week   PT Duration 8 weeks   PT Treatment/Interventions ADLs/Self Care Home Management;Neuromuscular re-education;Cognitive remediation;Biofeedback;DME Instruction;Gait training;Stair training;Canalith Repostioning;Patient/family education;Orthotic Fit/Training;Balance training;Therapeutic exercise;Manual techniques;Therapeutic activities;Vestibular   PT Next Visit Plan Follow up on wedge in R shoe.  Continue gait and strengthening.   PT Home Exercise Plan Flexibility and strengthening HEP   Consulted and Agree with Plan of Care Patient        Problem List Patient Active Problem List   Diagnosis Date Noted  . Nontraumatic cortical hemorrhage of cerebral hemisphere   . Seizure   . Seizures   . Adjustment disorder with depressed mood   . Contracture of muscle ankle and foot 06/11/2015  . Hemiplga fol ntrm intcrbl hemor aff right dominant side 06/06/2015  . Left-sided intracerebral hemorrhage 06/04/2015  . Seizure disorder as sequela of cerebrovascular accident   . Seizure disorder 06/03/2015  . Sepsis 06/03/2015  . UTI (urinary tract infection) 06/03/2015  . Hypotension 06/02/2015  . Right spastic hemiparesis 05/28/2015  . Aphasia following nontraumatic  intracerebral hemorrhage 05/28/2015  . History of anxiety disorder 05/28/2015  . ICH (intracerebral hemorrhage)   . Seizure disorder, nonconvulsive, with status epilepticus   . Cerebral venous thrombosis of cortical vein   . Cytotoxic cerebral edema   . IVH (intraventricular hemorrhage)   . Term pregnancy 05/07/2015  . Spontaneous vaginal delivery 05/07/2015    Narda Bonds 07/12/2015, 4:15 PM  [facility] [address] [place], [place], [address] Phone: [phone]   Fax:  [place], [place] 07/12/2015 4:15 PM Phone: [phone] Fax: [phone]

## 2015-07-12 NOTE — Therapy (Signed)
[facility] [address] [place], [place], [address] Phone: [phone]   Fax:  [phone]  Speech Language Pathology Treatment  Patient Details  Name: Ashley [name] MRN: [id number] Date of Birth: 05-06-1976 Referring Provider:  Marton Redwood, MD  Encounter Date: 07/12/2015      End of Session - 07/12/15 0853    Visit Number 5   Number of Visits 24   Date for SLP Re-Evaluation 09/12/15   Authorization - Visit Number 4   Authorization - Number of Visits 82   SLP Start Time 0804   SLP Stop Time  0846   SLP Time Calculation (min) 42 min   Activity Tolerance Patient tolerated treatment well      Past Medical History  Diagnosis Date  . Anxiety   . ICH (intracerebral hemorrhage)     Past Surgical History  Procedure Laterality Date  . Dilation and curettage of uterus      There were no vitals filed for this visit.  Visit Diagnosis: Cognitive communication deficit      Subjective Assessment - 07/12/15 0808    Subjective Pt did homework (SLP thinks with likely alternating attention).               ADULT SLP TREATMENT - 07/12/15 0810    General Information   Behavior/Cognition Alert;Cooperative;Pleasant mood   Treatment Provided   Treatment provided Cognitive-Linquistic   Pain Assessment   Pain Assessment 0-10   Pain Score 2    Pain Location tail bone   Pain Descriptors / Indicators Sore   Pain Intervention(s) Monitored during session   Cognitive-Linquistic Treatment   Treatment focused on Cognition   Skilled Treatment Divided attention between word ID in word string with 100% auditory and 80% written (directions). SLP had to encourage pt to check over her answers. In a reasoning task, pt remarked she worked slower than  she would have premorbidly, however was 100% successful with word ID in a string and deductive reasoning. In alphabetizing 17 names (reading) and auditory (figuring math  problems) resulted in 63% alternating and 37% divided.   Assessment / Recommendations / Plan   Plan Continue with current plan of care   Progression Toward Goals   Progression toward goals Progressing toward goals            SLP Short Term Goals - 07/12/15 0854    SLP SHORT TERM GOAL #1   Title pt will alternate attention between simple cognitive-linguistic tasks with 85% success and WFL switch time over 3 sessions   Period --  or 12 visits, for all STGs   Status Achieved   SLP SHORT TERM GOAL #2   Title pt will demo verbal safety awareness related to deficits   Time 3   Period Weeks   Status On-going   SLP SHORT TERM GOAL #3   Title pt will complete mod complex time, money, sequencing, calendar problems 95% with modified independence (self-correction)   Time 3   Period Weeks   Status On-going   SLP SHORT TERM GOAL #4   Title pt will demo divided attention in two simple tasks 80% of the time with rare min A   Time 3   Period Weeks   Status On-going          SLP Long Term Goals - 07/11/15 1316    SLP LONG TERM GOAL #1   Title pt will divide attention between two mod complex cognitive linguistic tasks 90% each  task   Time 7   Period Weeks   Status On-going   SLP LONG TERM GOAL #2   Title pt will demo anticipatory awareness by using compensatory measures for memory and/or decr'd attention x1 in a session over 4 sessions   Time 7   Period Weeks   Status On-going   SLP LONG TERM GOAL #3   Title pt will demo executive function in a simple task with rare min A   Time 7   Period Weeks   Status On-going          Plan - 07/12/15 0240    Clinical Impression Statement Divided attention tasks were completed with alternating attention, or with selective attention. Pt requires skilled ST to cont to improve cognitive liguistic tasks to return to workforce, and to reduce caregiver burden.   Speech Therapy Frequency 2x / week   Duration --  7 weeks or 12 more visits    Treatment/Interventions Cognitive reorganization;SLP instruction and feedback;Compensatory strategies;Internal/external aids;Environmental controls;Patient/family education;Functional tasks;Cueing hierarchy;Language facilitation   Potential to Achieve Goals Good   Potential Considerations Ability to learn/carryover information        Problem List Patient Active Problem List   Diagnosis Date Noted  . Nontraumatic cortical hemorrhage of cerebral hemisphere   . Seizure   . Seizures   . Adjustment disorder with depressed mood   . Contracture of muscle ankle and foot 06/11/2015  . Hemiplga fol ntrm intcrbl hemor aff right dominant side 06/06/2015  . Left-sided intracerebral hemorrhage 06/04/2015  . Seizure disorder as sequela of cerebrovascular accident   . Seizure disorder 06/03/2015  . Sepsis 06/03/2015  . UTI (urinary tract infection) 06/03/2015  . Hypotension 06/02/2015  . Right spastic hemiparesis 05/28/2015  . 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 History of anxiety disorder 05/28/2015  . ICH (intracerebral hemorrhage)   . Seizure disorder, nonconvulsive, with status epilepticus   . Cerebral venous thrombosis of cortical vein   . Cytotoxic cerebral edema   . IVH (intraventricular hemorrhage)   . Term pregnancy 05/07/2015  . Spontaneous vaginal delivery 05/07/2015    [facility] , [place], [place]  07/12/2015, 8:55 AM  [facility] [address] [place], [place], [address] Phone: [phone]   Fax:  [phone]

## 2015-07-12 NOTE — Therapy (Signed)
[facility] [address] [place], [place], [address] Phone: [phone]   Fax:  [phone]  Physical Therapy Treatment  Patient Details  Name: Ashley [name] MRN: [id number] Date of Birth: 1975-12-09 Referring Provider:  Marton Redwood, MD  Encounter Date: 07/12/2015      PT End of Session - 07/12/15 1659    Visit Number 5   Number of Visits 17   Date for PT Re-Evaluation 08/28/15   Authorization Type [employer] (30 visits total for OT/PT). Pt and pt's husband stated they will pay out of pocket after visits are complete, if necessary.   Authorization - Visit Number 5   Authorization - Number of Visits 15   PT Start Time [phone]   PT Stop Time 0932   PT Time Calculation (min) 45 min   Equipment Utilized During Treatment Gait belt   Activity Tolerance Patient tolerated treatment well   Behavior During Therapy WFL for tasks assessed/performed      Past Medical History  Diagnosis Date  . Anxiety   . ICH (intracerebral hemorrhage)     Past Surgical History  Procedure Laterality Date  . Dilation and curettage of uterus      There were no vitals filed for this visit.  Visit Diagnosis:  Abnormality of gait      Subjective Assessment - 07/12/15 1657    Subjective Denies changes since last visit.     Pertinent History Seizures, low BP   Patient Stated Goals "Be as close to normal as much as possible and to go back to running" Get back to taking care of my kids, 41 weeks old and 28 and 45 years old   Currently in Pain? No/denies      Gait with R AFO and heel wedge with SBQC, in parallel bars and without device working on heel strike on R and weight shifting to R.  Poor R knee flexion and decreased stance time on R.  Reponds well to tactile, verbal and visual cues. Trialed gait without AFO with little change in gait other than evident R foot supination (decreased eversion).  Sit<>Stand with RLE on bubble, 3" step and  bubble on 3" step with mirror for visual feedback to encourage R sided weight bearing and weight shifting.  Trialed using LUE on R knee to encourage weight shift. Wall slides/squats with chair in front for UE support working on equal weight bearing.         PT Education - 07/12/15 1658    Education provided Yes   Education Details Promoting WB on R side with all actvities   Person(s) Educated Patient   Methods Explanation;Demonstration   Comprehension Verbalized understanding          PT Short Term Goals - 07/05/15 1157    PT SHORT TERM GOAL #1   Title Pt will be IND in HEP to improve strength, balance, and endurance. Target date: 07/27/15.   Status On-going   PT SHORT TERM GOAL #2   Title Pt will improve gait speed to >/=1.74ft/sec to decrease falls risk. Target date: 07/27/15.   Status On-going   PT SHORT TERM GOAL #3   Title Pt will ambulate 300' with LRAD, over even terrain, at MOD I level to improve functional mobility. Target date: 07/27/15.   Status On-going   PT SHORT TERM GOAL #4   Title Pt will perform TUG in </=13.5 seconds with LRAD to decr. falls risk. Target date: 07/27/15.   Status On-going  PT SHORT TERM GOAL #5   Title Perform BERG and write STG and LTG as appropriate. Target date: 07/27/15.   Status Achieved   Additional Short Term Goals   Additional Short Term Goals Yes   PT SHORT TERM GOAL #6   Title Pt will improve BERG to >/=51/56 to decr. falls risk. Target date: 07/27/15.   Status New           PT Long Term Goals - 07/05/15 1157    PT LONG TERM GOAL #1   Title Pt will ambulate 1000' over even/uneven terrain, with LRAD, at MOD I level to improve functional mobiliity. Target date: 08/24/15.   Status On-going   PT LONG TERM GOAL #2   Title Pt will amb. 300' over even terrain without an AD, IND, to safely amb. in home. Target date: 08/24/15.   Status On-going   PT LONG TERM GOAL #3   Title Pt will improve gait speed to >/=2.59ft/sec, with  LRAD, to amb. safely in the community. Target date: 08/24/15.   Status On-going   PT LONG TERM GOAL #4   Title Pt will verbalize plans to join a fitness center upon d/c from PT to continue to maintain strength and endurance gains made during PT. Target date: 08/24/15.   Status On-going   PT LONG TERM GOAL #5   Title Pt improve BERG score to >/=55/56 to decr. falls risk. Target date: 08/24/15.   Status New               Plan - 07/12/15 1659    Clinical Impression Statement Pt reponds well to cues to improve R sided weight bearing.  No rest breaks needed during session.  Continue PT per POC.   Pt will benefit from skilled therapeutic intervention in order to improve on the following deficits Abnormal gait;Decreased endurance;Impaired sensation;Decreased knowledge of precautions;Decreased activity tolerance;Decreased knowledge of use of DME;Decreased strength;Impaired UE functional use;Impaired tone;Decreased balance;Decreased mobility;Decreased cognition;Decreased range of motion;Decreased safety awareness;Decreased coordination;Impaired flexibility   Rehab Potential Good   Clinical Impairments Affecting Rehab Potential Seizures which may limit intensity of PT   PT Frequency 2x / week   PT Duration 8 weeks   PT Treatment/Interventions ADLs/Self Care Home Management;Neuromuscular re-education;Cognitive remediation;Biofeedback;DME Instruction;Gait training;Stair training;Canalith Repostioning;Patient/family education;Orthotic Fit/Training;Balance training;Therapeutic exercise;Manual techniques;Therapeutic activities;Vestibular   PT Next Visit Plan Activities to promote R sided weight bearing and weight shift, gait with focus on heel strike on R and weight bearing on R.   PT Home Exercise Plan Flexibility and strengthening HEP   Consulted and Agree with Plan of Care Patient        Problem List Patient Active Problem List   Diagnosis Date Noted  . Nontraumatic cortical hemorrhage of  cerebral hemisphere   . Seizure   . Seizures   . Adjustment disorder with depressed mood   . Contracture of muscle ankle and foot 06/11/2015  . Hemiplga fol ntrm intcrbl hemor aff right dominant side 06/06/2015  . Left-sided intracerebral hemorrhage 06/04/2015  . Seizure disorder as sequela of cerebrovascular accident   . Seizure disorder 06/03/2015  . Sepsis 06/03/2015  . UTI (urinary tract infection) 06/03/2015  . Hypotension 06/02/2015  . Right spastic hemiparesis 05/28/2015  . 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 History of anxiety disorder 05/28/2015  . ICH (intracerebral hemorrhage)   . Seizure disorder, nonconvulsive, with status epilepticus   . Cerebral venous thrombosis of cortical vein   . Cytotoxic cerebral edema   . IVH (intraventricular  hemorrhage)   . Term pregnancy 05/07/2015  . Spontaneous vaginal delivery 05/07/2015    Narda Bonds 07/12/2015, 5:03 PM  [facility] [address] [place], [place], [address] Phone: [phone]   Fax:  [place], [place] 07/12/2015 5:03 PM Phone: [phone] Fax: [phone]

## 2015-07-12 NOTE — Therapy (Signed)
[facility] [address] [place], [place], [address] Phone: [phone]   Fax:  [phone]  Occupational Therapy Treatment  Patient Details  Name: Ashley [name] MRN: [id number] Date of Birth: 09/11/1976 Referring Provider:  Marton Redwood, MD  Encounter Date: 07/12/2015      OT End of Session - 07/12/15 1152    Visit Number 5   Number of Visits 17   Date for OT Re-Evaluation 08/29/15   Authorization Type BCBS   Authorization Time Period 30 visit PT/ OT combined, pt/ husband plan to self pay if needed after benefits are used   OT Start Time 0932   OT Stop Time 1017   OT Time Calculation (min) 45 min   Activity Tolerance Patient tolerated treatment well      Past Medical History  Diagnosis Date  . Anxiety   . ICH (intracerebral hemorrhage)     Past Surgical History  Procedure Laterality Date  . Dilation and curettage of uterus      There were no vitals filed for this visit.  Visit Diagnosis:  Cognitive deficits following oth ntrm intcrn hemorrhage  Weakness due to cerebrovascular accident  Coordination impairment      Subjective Assessment - 07/12/15 0938    Subjective  I fell because I was trying to do too  much and got twisted up   Pertinent History see epic snapshot; pt with SAH 2 weeks after deliverying her 3rd child   Patient Stated Goals to be able to use my R arm normally   Currently in Pain? No/denies                      OT Treatments/Exercises (OP) - 07/12/15 0001    Neurological Re-education Exercises   Other Exercises 1 Neuro re ed to address alignment, decreasing tone, improved incorporation of R side in sit to stand, static and dynamic standing balance, stand to sit and functional ambulation using RUE as stabilizer during activities (vs left). Also focused on increasing weight bearing on RLE to decrease flexor synergy influence on RUE during functional mobility                   OT Short Term Goals - 07/12/15 1149    OT SHORT TERM GOAL #1   Title I with HEP.   Baseline due 07/28/15   Time 4   Period Weeks   Status On-going   OT SHORT TERM GOAL #2   Title Pt will demonstrate impoved fine motor coordinationas evidenced by decreasing RUE 9 hole peg test score to 35 secs or less.   Time 4   Period Weeks   Status On-going   OT SHORT TERM GOAL #3   Title Pt will perform all basic ADLs at a supervision level.   Time 4   Period Weeks   Status On-going   OT SHORT TERM GOAL #4   Title Pt will use RUE as an active assist for ADLS/IADLS at least 50 % of the time.   Time 4   Period Weeks   Status On-going   OT SHORT TERM GOAL #5   Title Pt will perform basic home management/ cooking at min A level.   Time 4   Period Weeks   Status On-going           OT Long Term Goals - 07/12/15 1149    OT LONG TERM GOAL #1   Title Pt will perfom all basic  ADLS modidied independently.   Baseline due 08/27/15   Time 8   Period Weeks   Status On-going   OT LONG TERM GOAL #2   Title Pt will perform basic home management/ cooking at a supervision level demonstrating good safety awareness.   Time 8   Period Weeks   Status On-going   OT LONG TERM GOAL #3   Title Pt will demonstrate ability to retrieve a 3 lbs weight at 120 x 5 reps without dropping with RUE   Time 8   Period Weeks   Status On-going   OT LONG TERM GOAL #4   Title Pt will resume use of RUE as dominant hand at least 75% of the time for ADLs/ IADLs.   Time 8   Period Weeks   Status On-going   OT LONG TERM GOAL #5   Title Pt will demonstrate ability to perform a physical and cognitive task simultaneously with 80% or better accuracy.   Time 8   Period Weeks   Status On-going   OT LONG TERM GOAL #6   Title Pt will demonstrate improved fine motor coordination as evidenced by decreasing RUE 9 hole peg test score to 30 secs or less.   Time 8   Period Weeks   Status On-going                Plan - 07/12/15 1150    Clinical Impression Statement Pt making good progress toward goals;  Pt able to demonstrate decreased tone and ability to over ride flexor synergy influence with better alignment and repetition. As pt fatigues, attetnion and ability to attend to detail are impacted.    Pt will benefit from skilled therapeutic intervention in order to improve on the following deficits (Retired) Abnormal gait;Decreased coordination;Decreased range of motion;Difficulty walking;Impaired flexibility;Decreased endurance;Decreased safety awareness;Increased edema;Impaired tone;Decreased knowledge of precautions;Decreased activity tolerance;Decreased balance;Decreased knowledge of use of DME;Pain;Impaired UE functional use;Impaired vision/preception;Decreased cognition;Decreased mobility;Decreased strength   Rehab Potential Good   OT Frequency 2x / week   OT Duration 8 weeks   OT Treatment/Interventions Self-care/ADL training;Therapeutic exercise;Patient/family education;Balance training;Ultrasound;Neuromuscular education;Manual Therapy;Splinting;Therapeutic exercises;Energy conservation;Parrafin;DME and/or AE instruction;Therapeutic activities;Cognitive remediation/compensation;Gait Training;Fluidtherapy;Electrical Stimulation;Moist Heat;Contrast Bath;Passive range of motion;Visual/perceptual remediation/compensation   Plan continue to progress RUE functional use and coordinaton as well as functional mobility   OT Home Exercise Plan issued: ball ex in supine, trunk for A-p tilts with trunk rotation(07/05/15)   Consulted and Agree with Plan of Care Patient        Problem List Patient Active Problem List   Diagnosis Date Noted  . Nontraumatic cortical hemorrhage of cerebral hemisphere   . Seizure   . Seizures   . Adjustment disorder with depressed mood   . Contracture of muscle ankle and foot 06/11/2015  . Hemiplga fol ntrm intcrbl hemor aff right dominant side 06/06/2015   . Left-sided intracerebral hemorrhage 06/04/2015  . Seizure disorder as sequela of cerebrovascular accident   . Seizure disorder 06/03/2015  . Sepsis 06/03/2015  . UTI (urinary tract infection) 06/03/2015  . Hypotension 06/02/2015  . Right spastic hemiparesis 05/28/2015  . 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 History of anxiety disorder 05/28/2015  . ICH (intracerebral hemorrhage)   . Seizure disorder, nonconvulsive, with status epilepticus   . Cerebral venous thrombosis of cortical vein   . Cytotoxic cerebral edema   . IVH (intraventricular hemorrhage)   . Term pregnancy 05/07/2015  . Spontaneous vaginal delivery 05/07/2015    Quay Burow, OTR/L 07/12/2015, 11:53  AM  [facility] [address] [place], [place], [address] Phone: [phone]   Fax:  [phone]

## 2015-07-16 ENCOUNTER — Encounter: Payer: BLUE CROSS/BLUE SHIELD | Attending: Physical Medicine & Rehabilitation

## 2015-07-16 ENCOUNTER — Encounter: Payer: Self-pay | Admitting: Physical Medicine & Rehabilitation

## 2015-07-16 ENCOUNTER — Ambulatory Visit (HOSPITAL_BASED_OUTPATIENT_CLINIC_OR_DEPARTMENT_OTHER): Payer: BLUE CROSS/BLUE SHIELD | Admitting: Physical Medicine & Rehabilitation

## 2015-07-16 VITALS — BP 86/54 | HR 73 | Resp 14

## 2015-07-16 DIAGNOSIS — I69120 Aphasia following nontraumatic intracerebral hemorrhage: Secondary | ICD-10-CM

## 2015-07-16 DIAGNOSIS — I69151 Hemiplegia and hemiparesis following nontraumatic intracerebral hemorrhage affecting right dominant side: Secondary | ICD-10-CM | POA: Diagnosis not present

## 2015-07-16 DIAGNOSIS — Z79899 Other long term (current) drug therapy: Secondary | ICD-10-CM | POA: Insufficient documentation

## 2015-07-16 DIAGNOSIS — G8111 Spastic hemiplegia affecting right dominant side: Secondary | ICD-10-CM | POA: Insufficient documentation

## 2015-07-16 DIAGNOSIS — F419 Anxiety disorder, unspecified: Secondary | ICD-10-CM | POA: Diagnosis not present

## 2015-07-16 NOTE — Patient Instructions (Addendum)
Agree with quad strengthening as well as tibialis anterior strengthening. May rub the right leg to help stimulate sensation.  Discussed Keppra with neurology  Would hold off on  Water therapy in a community pool for now

## 2015-07-16 NOTE — Progress Notes (Signed)
Subjective:    Patient ID: Ashley [name], female    DOB: [birth year], 39 y.o.   MRN: [id number] 39 y.o. Postpartum female admitted on 05/20/15 with complaints of HA since delivery and developed rightht facial droop with aphasia, right sided weakness and seizures. She was found to have acute large left fronto-parietal IPH/SAH and was started on Keppra for treatment.  Work up without definite cerebral venous sinus/cortical vein thrombosis and no anticoagulation needed per neurology. She had resultant dense left hemiplegia, aphasia as well as dysphagia and was admitted to CIR on 05/28/15 for rehab. She was started on Seroquel to help with sleep wake disruption as well as Keflex for Klebsiella UTI.  With increase in activity level, she has had worsening of RLE spasms and Zanaflex was added and titrated to bid to help with symptoms.  On 06/02/15 am, she developed seizures with obtundation and was transferred to acute services due to concerns of sepsis and was placed on broad spectrum antibiotics.   Follow up CT head showed resolving left frontoparietal hemorrhage. Neurology question seizures as cause of MS changes and Keppra was increased to 1500 mg bid.   CIR Admit date: 06/04/2015 Discharge date: 06/27/2015  HPI Patient is living at home with her husband and her children. She is attending neuro-rehabilitation twice a week for PT and OT and speech In addition patient is paying privately for physical therapy at  [facility] [phone]  According to her husband the therapist there is Working on Forensic scientist as well as ankle dorsiflexor strengthening.  From a functional standpoint patient is requiring supervision with shower transfers. She is independent with dressing including tying shoes She no longer uses a cane or walker for ambulation. She continues to use the right AFO. Her husband is wondering whether or not she needs to continue this or not at the  current time.  Patient has fallen approximately 4 times since discharge. She states she has not hurt herself has fallen on her tail bone. She states that she tries to multitask and this is what gets her off balance. Pain Inventory Average Pain 0 Pain Right Now 0 My pain is no pain  In the last 24 hours, has pain interfered with the following? General activity 0 Relation with others 0 Enjoyment of life 0 What TIME of day is your pain at its worst? no pain Sleep (in general) Fair  Pain is worse with: no pain Pain improves with: no pain Relief from Meds: no pain  Mobility walk without assistance walk with assistance how many minutes can you walk? 5 ability to climb steps?  yes do you drive?  no  Function not employed: date last employed .  Neuro/Psych numbness tingling trouble walking dizziness depression anxiety  Prior Studies CT/MRI hospital f/u  Physicians involved in your care Primary care . Neurologist . Psychologist . hospital f/u   Family History  Problem Relation Age of Onset  . Cancer Mother   . Cancer Father     pancreatic   Social History   Social History  . Marital Status: Married    Spouse Name: N/A  . Number of Children: N/A  . Years of Education: N/A   Social History Main Topics  . Smoking status: Never Smoker   . Smokeless tobacco: Never Used  . Alcohol Use: Yes     Comment: NONE  WHILE  PREG  . Drug Use: No  . Sexual Activity: Not Asked   Other  Topics Concern  . None   Social History Narrative   Past Surgical History  Procedure Laterality Date  . Dilation and curettage of uterus     Past Medical History  Diagnosis Date  . Anxiety   . ICH (intracerebral hemorrhage) (HCC)    BP 86/54 mmHg  Pulse 73  Resp 14  SpO2 98%  Opioid Risk Score:   Fall Risk Score:  `1  Depression screen PHQ 2/9  Depression screen PHQ 2/9 07/16/2015  Decreased Interest 1  Down, Depressed, Hopeless 1  PHQ - 2 Score 2  Altered sleeping 0   Tired, decreased energy 1  Change in appetite 1  Feeling bad or failure about yourself  1  Trouble concentrating 1  Moving slowly or fidgety/restless 1  Suicidal thoughts 1  PHQ-9 Score 8  Difficult doing work/chores Somewhat difficult    Review of Systems  Constitutional: Positive for appetite change.       Poor appetite   Musculoskeletal: Positive for gait problem.  Neurological: Positive for dizziness and numbness.       Tingling   Psychiatric/Behavioral: Positive for dysphoric mood. The patient is nervous/anxious.   All other systems reviewed and are negative.      Objective:   Physical Exam  Constitutional: She is oriented to person, place, and time. She appears well-developed and well-nourished.  HENT:  Head: Normocephalic and atraumatic.  Right Ear: External ear normal.  Left Ear: External ear normal.  Eyes: Conjunctivae and EOM are normal. Pupils are equal, round, and reactive to light.  Neck: Normal range of motion.  Musculoskeletal: Normal range of motion.  Neurological: She is alert and oriented to person, place, and time. She has normal reflexes. No cranial nerve deficit. She exhibits abnormal muscle tone. Coordination abnormal.  Neuro:  Eyes without evidence of nystagmus  Tone is normal without evidence of spasticity Cerebellar exam shows no evidence of ataxia on finger nose finger or heel to shin testing No evidence of trunkal ataxia  Motor strength is 5/5 in Left deltoid, biceps, triceps, finger flexors and extensors, wrist flexors and extensors, hip flexors, knee flexors and extensors, ankle dorsiflexors, plantar flexors, invertors and evertors, toe flexors and extensors  4/5 in the right deltoid, biceps, triceps, grip 3+ in the right hip flexor 3 at the right knee extensors 2 minus at the right ankle dorsiflexor and plantar flexor  Sensory exam is normal to pinprick, proprioception and light touch in the upper limbs  Reduced pinprick and light touch  in the right lower limb  Cranial nerves II- Visual fields are intact to confrontation testing, no blurring of vision III- no evidence of ptosis, upward, downward and medial gaze intact IV- no vertical diplopia or head tilt V- no facial numbness or masseter weakness VI- no pupil abduction weakness VII- no facial droop, good lid closure VII- normal auditory acuity       Psychiatric: She has a normal mood and affect.  Nursing note and vitals reviewed.  Patient ambulate with AFO she has poor knee control. She does have good foot clearance with AFO on She is equina varus spasticity noted without the AFO on even when she takes only one or 2 steps. She does have right ankle instability tendency towards inversion       Assessment & Plan:  1. Left ACA Distribution intraparenchymal hemorrhage, also has some subarachnoid hemorrhage extending more laterally. Her deficits include right lower extremity weakness greater than upper extremity weakness as well as a mild residual  aphasia and cognitive deficits. Overall she is making an excellent recovery. We discussed timeframe of recovery including Rehabilitation strategies to maximize Functional improvement. We discussed sensory stimulation right lower extremity.We discussed quad strengthening right lower extremity as well as ankle dorsiflexor strengthening. She does have increased tone in the plantar flexors and supinators, she may benefit from either tibial nerve block with phenol or from Botox injection to the tibialis posterior. Continue outpatient therapy, PTOT and speech, I am in agreement with the extra PT that they were privately funding Do not recommend any community aquatic program at the current time  Neuro follow-up to determine duration of Keppra Needs to wear AFO whenever she is up ambulating

## 2015-07-17 ENCOUNTER — Ambulatory Visit: Payer: BLUE CROSS/BLUE SHIELD | Attending: Physical Medicine & Rehabilitation

## 2015-07-17 DIAGNOSIS — R279 Unspecified lack of coordination: Secondary | ICD-10-CM | POA: Insufficient documentation

## 2015-07-17 DIAGNOSIS — R531 Weakness: Secondary | ICD-10-CM | POA: Insufficient documentation

## 2015-07-17 DIAGNOSIS — I69151 Hemiplegia and hemiparesis following nontraumatic intracerebral hemorrhage affecting right dominant side: Secondary | ICD-10-CM | POA: Insufficient documentation

## 2015-07-17 DIAGNOSIS — G8111 Spastic hemiplegia affecting right dominant side: Secondary | ICD-10-CM | POA: Insufficient documentation

## 2015-07-17 DIAGNOSIS — R278 Other lack of coordination: Secondary | ICD-10-CM | POA: Diagnosis present

## 2015-07-17 DIAGNOSIS — R29898 Other symptoms and signs involving the musculoskeletal system: Secondary | ICD-10-CM | POA: Insufficient documentation

## 2015-07-17 DIAGNOSIS — R4701 Aphasia: Secondary | ICD-10-CM | POA: Insufficient documentation

## 2015-07-17 DIAGNOSIS — R269 Unspecified abnormalities of gait and mobility: Secondary | ICD-10-CM | POA: Insufficient documentation

## 2015-07-17 DIAGNOSIS — R41841 Cognitive communication deficit: Secondary | ICD-10-CM | POA: Diagnosis present

## 2015-07-17 DIAGNOSIS — I69898 Other sequelae of other cerebrovascular disease: Secondary | ICD-10-CM | POA: Insufficient documentation

## 2015-07-17 NOTE — Therapy (Signed)
[facility] [address] [place], [place], [address] Phone: [phone]   Fax:  [phone]  Physical Therapy Treatment  Patient Details  Name: Ashley [name] MRN: [id number] Date of Birth: 1976/09/19 Referring Provider:  Marton Redwood, MD  Encounter Date: 07/17/2015      PT End of Session - 07/17/15 1009    Visit Number 6   Number of Visits 17   Date for PT Re-Evaluation 08/28/15   Authorization Type BCBS (30 visits total for OT/PT). Pt and pt's husband stated they will pay out of pocket after visits are complete, if necessary.   Authorization - Visit Number 6   Authorization - Number of Visits 15   PT Start Time [phone]   PT Stop Time 0928   PT Time Calculation (min) 42 min   Equipment Utilized During Treatment Gait belt   Activity Tolerance Patient tolerated treatment well   Behavior During Therapy WFL for tasks assessed/performed      Past Medical History  Diagnosis Date  . Anxiety   . ICH (intracerebral hemorrhage) [facility])     Past Surgical History  Procedure Laterality Date  . Dilation and curettage of uterus      There were no vitals filed for this visit.  Visit Diagnosis:  Abnormality of gait      Subjective Assessment - 07/17/15 0849    Subjective Pt reported one fall over the weekend, while turning around in the bathroom without her cane.  "I caught myself with my hands and butt." Pt denied hitting head or other injuries.   Pertinent History Seizures, low BP   Patient Stated Goals "Be as close to normal as much as possible and to go back to running" Get back to taking care of my kids, 40 weeks old and 40 and 40 years old   Currently in Pain? No/denies                         [facility] Adult PT Treatment/Exercise - 07/17/15 8850    Ambulation/Gait   Ambulation/Gait Yes   Ambulation/Gait Assistance 5: Supervision;4: Min guard   Ambulation/Gait Assistance Details Pt ambulated with SPC  and SBQC, improved weight shifting to the R side with cane at incr. height. Cues to improve B heel strike and wt. shifting to R side. Pt amb. while performing head turns indoors and outdoors, and used stepping strategy to correct 3 LOB episodes.  Pt required 2 seated rest breaks 2/2 fatigue. Cues to decr. R UE flexion during gait.   Ambulation Distance (Feet) --  19' indoors with SPC, 14' with SBQC indoors, 600' outdoors and [facility], 230' and 200' indoors and SPC,   Assistive device Small based quad cane;Straight cane   Gait Pattern Step-to pattern;Decreased step length - left;Decreased dorsiflexion - right;Decreased hip/knee flexion - right;Decreased stride length;Ataxic;Right genu recurvatum   Ambulation Surface Level;Indoor   Gait velocity --  2.16f/sec with SPC and [facility]              PT Education - 07/17/15 1008    Education provided Yes   Education Details PT reiterated the importance of always using cane, to decr. falls risk. PT educated pt that she is able to [facility] to [facility] SB[facility].   Person(s) Educated Patient   Methods Explanation;Demonstration   Comprehension Verbalized understanding          PT Short Term Goals - 07/17/15 1012  PT SHORT TERM GOAL #1   Title Pt will be IND in HEP to improve strength, balance, and endurance. Target date: 07/27/15.   Status On-going   PT SHORT TERM GOAL #2   Title Pt will improve gait speed to >/=1.4f/sec to decrease falls risk. Target date: 07/27/15.   Status Achieved   PT SHORT TERM GOAL #3   Title Pt will ambulate 300' with LRAD, over even terrain, at MOD I level to improve functional mobility. Target date: 07/27/15.   Status On-going   PT SHORT TERM GOAL #4   Title Pt will perform TUG in </=13.5 seconds with LRAD to decr. falls risk. Target date: 07/27/15.   Status On-going   PT SHORT TERM GOAL #5   Title Perform BERG and write STG and LTG as appropriate. Target date: 07/27/15.   Status Achieved   PT  SHORT TERM GOAL #6   Title Pt will improve BERG to >/=51/56 to decr. falls risk. Target date: 07/27/15.   Status New           PT Long Term Goals - 07/05/15 1157    PT LONG TERM GOAL #1   Title Pt will ambulate 1000' over even/uneven terrain, with LRAD, at MOD I level to improve functional mobiliity. Target date: 08/24/15.   Status On-going   PT LONG TERM GOAL #2   Title Pt will amb. 300' over even terrain without an AD, IND, to safely amb. in home. Target date: 08/24/15.   Status On-going   PT LONG TERM GOAL #3   Title Pt will improve gait speed to >/=2.634fsec, with LRAD, to amb. safely in the community. Target date: 08/24/15.   Status On-going   PT LONG TERM GOAL #4   Title Pt will verbalize plans to join a fitness center upon d/c from PT to continue to maintain strength and endurance gains made during PT. Target date: 08/24/15.   Status On-going   PT LONG TERM GOAL #5   Title Pt improve BERG score to >/=55/56 to decr. falls risk. Target date: 08/24/15.   Status New               Plan - 07/17/15 1009    Clinical Impression Statement Pt demonstrated progress towards goals, as she met STG 2 (gait speed). Pt demonstrated improved R heel strike but required cues to improve L heel strike due to decr. wt. shifting to R LE during amb., especially when outdoors and fatigued. Pt demonstrated improved L step length, and almost performs step through pattern with SPC. Pt's gait speed with faster with SPC vs. SBQC and pt demonstrated safe technique.   Pt will benefit from skilled therapeutic intervention in order to improve on the following deficits Abnormal gait;Decreased endurance;Impaired sensation;Decreased knowledge of precautions;Decreased activity tolerance;Decreased knowledge of use of DME;Decreased strength;Impaired UE functional use;Impaired tone;Decreased balance;Decreased mobility;Decreased cognition;Decreased range of motion;Decreased safety awareness;Decreased  coordination;Impaired flexibility   Rehab Potential Good   Clinical Impairments Affecting Rehab Potential Seizures which may limit intensity of PT   PT Frequency 2x / week   PT Duration 8 weeks   PT Treatment/Interventions ADLs/Self Care Home Management;Neuromuscular re-education;Cognitive remediation;Biofeedback;DME Instruction;Gait training;Stair training;Canalith Repostioning;Patient/family education;Orthotic Fit/Training;Balance training;Therapeutic exercise;Manual techniques;Therapeutic activities;Vestibular   PT Next Visit Plan Activities to promote R sided weight bearing and weight shift, gait with focus on heel strike B, stride length,  and weight bearing on R.   PT Home Exercise Plan Flexibility and strengthening HEP   Consulted and Agree with Plan of Care  Patient        Problem List Patient Active Problem List   Diagnosis Date Noted  . Nontraumatic cortical hemorrhage of cerebral hemisphere ([place])   . Seizure ([place])   . Seizures ([place])   . Adjustment disorder with depressed mood   . Contracture of muscle ankle and foot 06/11/2015  . Hemiplga fol ntrm intcrbl hemor aff right dominant side 06/06/2015  . Left-sided intracerebral hemorrhage ([place]) 06/04/2015  . Seizure disorder as sequela of cerebrovascular accident ([place])   . Seizure disorder ([place]) 06/03/2015  . Sepsis ([place]) 06/03/2015  . UTI (urinary tract infection) 06/03/2015  . Hypotension 06/02/2015  . Right spastic hemiparesis ([place]) 05/28/2015  . 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 History of anxiety disorder 05/28/2015  . ICH (intracerebral hemorrhage) ([place])   . Seizure disorder, nonconvulsive, with status epilepticus ([place])   . Cerebral venous thrombosis of cortical vein   . Cytotoxic cerebral edema ([place])   . IVH (intraventricular hemorrhage) ([place])   . Term pregnancy 05/07/2015  . Spontaneous vaginal delivery 05/07/2015    Daiveon Markman L 07/17/2015, 10:13 AM  [facility] [address] [place], [place], [address] Phone: [phone]   Fax:  [phone]     Geoffry Paradise, PT,DPT 07/17/2015 10:13 AM Phone: [phone] Fax: [phone]

## 2015-07-18 ENCOUNTER — Ambulatory Visit: Payer: BLUE CROSS/BLUE SHIELD

## 2015-07-18 ENCOUNTER — Ambulatory Visit: Payer: BLUE CROSS/BLUE SHIELD | Admitting: Occupational Therapy

## 2015-07-18 DIAGNOSIS — R41841 Cognitive communication deficit: Secondary | ICD-10-CM

## 2015-07-18 DIAGNOSIS — R278 Other lack of coordination: Secondary | ICD-10-CM

## 2015-07-18 DIAGNOSIS — R269 Unspecified abnormalities of gait and mobility: Secondary | ICD-10-CM | POA: Diagnosis not present

## 2015-07-18 DIAGNOSIS — IMO0002 Reserved for concepts with insufficient information to code with codable children: Secondary | ICD-10-CM

## 2015-07-18 NOTE — Therapy (Signed)
[facility] [address] [place], [place], [address] Phone: [phone]   Fax:  [phone]  Speech Language Pathology Treatment  Patient Details  Name: Ashley [name] MRN: [id number] Date of Birth: [birth year] Referring Provider:  Marton Redwood, MD  Encounter Date: 07/18/2015      End of Session - 07/18/15 1521    Visit Number 6   Number of Visits 24   Date for SLP Re-Evaluation 09/12/15   Authorization - Visit Number 5   Authorization - Number of Visits 30   SLP Start Time 1324   SLP Stop Time  4010   SLP Time Calculation (min) 43 min   Activity Tolerance Patient tolerated treatment well      Past Medical History  Diagnosis Date  . Anxiety   . ICH (intracerebral hemorrhage) [facility])     Past Surgical History  Procedure Laterality Date  . Dilation and curettage of uterus      There were no vitals filed for this visit.  Visit Diagnosis: Cognitive communication deficit      Subjective Assessment - 07/18/15 1406    Subjective "I didn't do my homework. I got to doing something else and forgot."               ADULT SLP TREATMENT - 07/18/15 1410    General Information   Behavior/Cognition Alert;Cooperative;Pleasant mood   Treatment Provided   Treatment provided Cognitive-Linquistic   Pain Assessment   Pain Assessment No/denies pain   Cognitive-Linquistic Treatment   Treatment focused on Cognition   Skilled Treatment Due to pt's fall this morning, worked with pt on [employer]. Pt's awareness, verbally, appears WNL. Pt completed alternating attention with mod complex tasks with minimal extra time, but accuracy was 100%. Pt able to switch with WNL time limits, extra teim came with the task itself (deductive reasoning)..   Assessment / Recommendations / Plan   Plan Continue with current plan of care   Progression Toward Goals   Progression toward goals Progressing toward goals           SLP Education - 07/18/15 1520    Education provided Yes   Education Details safety awareness, need to delegate responsibilities possible to delegate ("but people don't do things as fast I as want them done")   Person(s) Educated Patient   Methods Explanation   Comprehension Verbalized understanding;Need further instruction          SLP Short Term Goals - 07/18/15 [place] #1   Title pt will alternate attention between simple cognitive-linguistic tasks with 85% success and WFL switch time over 3 sessions   Period --  or 12 visits, for all STGs   Status Achieved   SLP SHORT TERM GOAL #2   Title pt will demo verbal safety awareness related to deficits   Status Achieved   SLP SHORT TERM GOAL #3   Title pt will complete mod complex time, money, sequencing, calendar problems 95% with modified independence (self-correction)   Time 3   Period Weeks   Status On-going   SLP SHORT TERM GOAL #4   Title pt will demo divided attention in two simple tasks 80% of the time with rare min A   Time 3   Period Weeks   Status On-going          SLP Long Term Goals - 07/18/15 [place] #1   Title pt  will divide attention between two mod complex cognitive linguistic tasks 90% each task   Time 6   Period Weeks   Status On-going   SLP LONG TERM GOAL #2   Title pt will demo anticipatory awareness by using compensatory measures for memory and/or decr'd attention x1 in a session over 4 sessions   Time 6   Period Weeks   Status On-going   SLP LONG TERM GOAL #3   Title pt will demo executive function in a simple task with rare min A   Time 6   Period Weeks   Status On-going          Plan - 07/18/15 1523    Clinical Impression Statement Alternating attention with mod complex tasks was completed with excellent success, however with minimally extra time for task requiring deductive reasoning. Cont skilled ST to maximize cognitive-linguistic skills to  more of a premorbid level.   Speech Therapy Frequency 2x / week   Duration --  6 weeks or 11 more visits   Treatment/Interventions Cognitive reorganization;SLP instruction and feedback;Compensatory strategies;Internal/external aids;Environmental controls;Patient/family education;Functional tasks;Cueing hierarchy;Language facilitation   Potential to Achieve Goals Good   Potential Considerations Ability to learn/carryover information   Consulted and Agree with Plan of Care Patient        Problem List Patient Active Problem List   Diagnosis Date Noted  . Nontraumatic cortical hemorrhage of cerebral hemisphere ([place])   . Seizure ([place])   . Seizures ([place])   . Adjustment disorder with depressed mood   . Contracture of muscle ankle and foot 06/11/2015  . Hemiplga fol ntrm intcrbl hemor aff right dominant side 06/06/2015  . Left-sided intracerebral hemorrhage ([place]) 06/04/2015  . Seizure disorder as sequela of cerebrovascular accident ([place])   . Seizure disorder ([place]) 06/03/2015  . Sepsis ([place]) 06/03/2015  . UTI (urinary tract infection) 06/03/2015  . Hypotension 06/02/2015  . Right spastic hemiparesis ([place]) 05/28/2015  . 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 History of anxiety disorder 05/28/2015  . ICH (intracerebral hemorrhage) ([place])   . Seizure disorder, nonconvulsive, with status epilepticus ([place])   . Cerebral venous thrombosis of cortical vein   . Cytotoxic cerebral edema ([place])   . IVH (intraventricular hemorrhage) ([place])   . Term pregnancy 05/07/2015  . Spontaneous vaginal delivery 05/07/2015    [facility] , [place], [place]  07/18/2015, 3:27 PM  [facility] [address] [place], [place], [address] Phone: [phone]   Fax:  [phone]

## 2015-07-18 NOTE — Patient Instructions (Signed)
  Please complete the assigned speech therapy homework and return it to your next session.  

## 2015-07-19 NOTE — Therapy (Signed)
[facility] [address] [place], [place], [address] Phone: [phone]   Fax:  [phone]  Occupational Therapy Treatment  Patient Details  Name: Ashley [name] MRN: [id number] Date of Birth: 1975/11/13 Referring Provider:  Marton Redwood, MD  Encounter Date: 07/18/2015      OT End of Session - 07/19/15 1650    Visit Number 6   Number of Visits 17   Date for OT Re-Evaluation 08/29/15   Authorization Type BCBS   Authorization Time Period 30 visit PT/ OT combined, pt/ husband plan to self pay if needed after benefits are used   Authorization - Visit Number 6   Authorization - Number of Visits 15   OT Start Time 0354   OT Stop Time 1400   OT Time Calculation (min) 43 min   Activity Tolerance Patient tolerated treatment well   Behavior During Therapy [employer] for tasks assessed/performed      Past Medical History  Diagnosis Date  . Anxiety   . ICH (intracerebral hemorrhage) [facility])     Past Surgical History  Procedure Laterality Date  . Dilation and curettage of uterus      There were no vitals filed for this visit.  Visit Diagnosis:  Weakness due to cerebrovascular accident  Coordination impairment      Subjective Assessment - 07/18/15 1321    Pertinent History see epic snapshot; pt with SAH 2 weeks after deliverying her 3rd child   Patient Stated Goals to be able to use my R arm normally   Currently in Pain? Yes   Pain Score 4    Pain Location Arm   Pain Orientation Right   Pain Descriptors / Indicators Aching   Pain Type Acute pain   Pain Onset Today   Aggravating Factors  fall   Pain Relieving Factors repositioning         Treatment: Quadraped lifitng alternate UE's, 10 reps min facilitation, followed by side plank x 5 reps, min facilitation Prone scapular stability exercise for shoulder extension, 10 reps, min v.c. Seated A-P weightshifts, with weightbearing through bilateral UE's to left and  right sides, min v.c.  Closed chain shoulder flexion x 10 reps, min v.c. Fine motor coordination task for grooved pegs, min difficulty.                       OT Short Term Goals - 07/12/15 1149    OT SHORT TERM GOAL #1   Title I with HEP.   Baseline due 07/28/15   Time 4   Period Weeks   Status On-going   OT SHORT TERM GOAL #2   Title Pt will demonstrate impoved fine motor coordinationas evidenced by decreasing RUE 9 hole peg test score to 35 secs or less.   Time 4   Period Weeks   Status On-going   OT SHORT TERM GOAL #3   Title Pt will perform all basic ADLs at a supervision level.   Time 4   Period Weeks   Status On-going   OT SHORT TERM GOAL #4   Title Pt will use RUE as an active assist for ADLS/IADLS at least 50 % of the time.   Time 4   Period Weeks   Status On-going   OT SHORT TERM GOAL #5   Title Pt will perform basic home management/ cooking at min A level.   Time 4   Period Weeks   Status On-going  OT Long Term Goals - 07/12/15 1149    OT LONG TERM GOAL #1   Title Pt will perfom all basic ADLS modidied independently.   Baseline due 08/27/15   Time 8   Period Weeks   Status On-going   OT LONG TERM GOAL #2   Title Pt will perform basic home management/ cooking at a supervision level demonstrating good safety awareness.   Time 8   Period Weeks   Status On-going   OT LONG TERM GOAL #3   Title Pt will demonstrate ability to retrieve a 3 lbs weight at 120 x 5 reps without dropping with RUE   Time 8   Period Weeks   Status On-going   OT LONG TERM GOAL #4   Title Pt will resume use of RUE as dominant hand at least 75% of the time for ADLs/ IADLs.   Time 8   Period Weeks   Status On-going   OT LONG TERM GOAL #5   Title Pt will demonstrate ability to perform a physical and cognitive task simultaneously with 80% or better accuracy.   Time 8   Period Weeks   Status On-going   OT LONG TERM GOAL #6   Title Pt will demonstrate  improved fine motor coordination as evidenced by decreasing RUE 9 hole peg test score to 30 secs or less.   Time 8   Period Weeks   Status On-going               Plan - 07/18/15 1355    Clinical Impression Statement Pt is progressing towards goals for RUE functional use.   Pt will benefit from skilled therapeutic intervention in order to improve on the following deficits (Retired) Abnormal gait;Decreased coordination;Decreased range of motion;Difficulty walking;Impaired flexibility;Decreased endurance;Decreased safety awareness;Increased edema;Impaired tone;Decreased knowledge of precautions;Decreased activity tolerance;Decreased balance;Decreased knowledge of use of DME;Pain;Impaired UE functional use;Impaired vision/preception;Decreased cognition;Decreased mobility;Decreased strength   Rehab Potential Good   OT Frequency 2x / week   OT Duration 8 weeks   Plan neuro re-ed, RUE functional use   OT Home Exercise Plan issued: ball ex in supine, trunk for A-p tilts with trunk rotation(07/05/15)   Consulted and Agree with Plan of Care Patient        Problem List Patient Active Problem List   Diagnosis Date Noted  . Nontraumatic cortical hemorrhage of cerebral hemisphere ([place])   . Seizure ([place])   . Seizures ([place])   . Adjustment disorder with depressed mood   . Contracture of muscle ankle and foot 06/11/2015  . Hemiplga fol ntrm intcrbl hemor aff right dominant side 06/06/2015  . Left-sided intracerebral hemorrhage ([place]) 06/04/2015  . Seizure disorder as sequela of cerebrovascular accident ([place])   . Seizure disorder ([place]) 06/03/2015  . Sepsis ([place]) 06/03/2015  . UTI (urinary tract infection) 06/03/2015  . Hypotension 06/02/2015  . Right spastic hemiparesis ([place]) 05/28/2015  . 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 History of anxiety disorder 05/28/2015  . ICH (intracerebral hemorrhage) ([place])   . Seizure disorder, nonconvulsive, with status  epilepticus ([place])   . Cerebral venous thrombosis of cortical vein   . Cytotoxic cerebral edema ([place])   . IVH (intraventricular hemorrhage) ([place])   . Term pregnancy 05/07/2015  . Spontaneous vaginal delivery 05/07/2015    Maheen Cwikla 07/19/2015, 5:18 PM Theone Murdoch, OTR/L [phone] Phone: [phone] 5:18 PM 07/19/2015 [facility] [address] [place], [place], [address] Phone: [phone]   Fax:  [phone]    

## 2015-07-20 ENCOUNTER — Ambulatory Visit: Payer: BLUE CROSS/BLUE SHIELD

## 2015-07-20 ENCOUNTER — Ambulatory Visit: Payer: BLUE CROSS/BLUE SHIELD | Admitting: Occupational Therapy

## 2015-07-20 DIAGNOSIS — IMO0002 Reserved for concepts with insufficient information to code with codable children: Secondary | ICD-10-CM

## 2015-07-20 DIAGNOSIS — R41841 Cognitive communication deficit: Secondary | ICD-10-CM

## 2015-07-20 DIAGNOSIS — R29898 Other symptoms and signs involving the musculoskeletal system: Secondary | ICD-10-CM

## 2015-07-20 DIAGNOSIS — R269 Unspecified abnormalities of gait and mobility: Secondary | ICD-10-CM

## 2015-07-20 DIAGNOSIS — R278 Other lack of coordination: Secondary | ICD-10-CM

## 2015-07-20 DIAGNOSIS — R279 Unspecified lack of coordination: Secondary | ICD-10-CM

## 2015-07-20 DIAGNOSIS — R4701 Aphasia: Secondary | ICD-10-CM

## 2015-07-20 NOTE — Therapy (Signed)
[facility] [address] [place], [place], [address] Phone: [phone]   Fax:  [phone]  Physical Therapy Treatment  Patient Details  Name: Ashley [name] MRN: [id number] Date of Birth: [birth year] Referring Provider:  Marton Redwood, MD  Encounter Date: 07/20/2015      PT End of Session - 07/20/15 1122    Visit Number 7   Number of Visits 17   Date for PT Re-Evaluation 08/28/15   Authorization Type BCBS (30 visits total for OT/PT). Pt and pt's husband stated they will pay out of pocket after visits are complete, if necessary.   Authorization - Visit Number 7   Authorization - Number of Visits 15   PT Start Time [phone]   PT Stop Time 0929   PT Time Calculation (min) 40 min   Equipment Utilized During Treatment Gait belt   Activity Tolerance Patient tolerated treatment well   Behavior During Therapy WFL for tasks assessed/performed      Past Medical History  Diagnosis Date  . Anxiety   . ICH (intracerebral hemorrhage) [facility])     Past Surgical History  Procedure Laterality Date  . Dilation and curettage of uterus      There were no vitals filed for this visit.  Visit Diagnosis:  Abnormality of gait  Right leg weakness      Subjective Assessment - 07/20/15 0852    Subjective Pt reported no falls since last visit. Pt reported she woke up with a headache (in the middle of the night) and took Tylenol and it is helping. Pt has not had a chance to purchase [facility] yet, as she's been watching her children.   Pertinent History Seizures, low BP   Patient Stated Goals "Be as close to normal as much as possible and to go back to running" Get back to taking care of my kids, 50 weeks old and 40 and 40 years old   Currently in Pain? Yes   Pain Score 3    Pain Location --  headache   Pain Orientation --  Per pt: from temple to temple   Pain Descriptors / Indicators Aching   Pain Type Acute pain   Pain Onset Today   Pain  Frequency Constant   Aggravating Factors  nothing   Pain Relieving Factors Tylenol                         [facility] Adult PT Treatment/Exercise - 07/20/15 0855    Ambulation/Gait   Ambulation/Gait Yes   Ambulation/Gait Assistance 5: Supervision   Ambulation/Gait Assistance Details Pt amb. indoors with SPC with R AFO/wedge donned and doffed. Cues to improve heel strike and L step length. Pt amb. in // bars with heel wedge donned but no AFO, PT provided tacile and VC's to improve R heel strike and to decr. R genu recurvatum: 6x7'. Pt required one seated rest break 2/2 fatigue.   Ambulation Distance (Feet) --  76' with AFO/wedge, 117' no AFO/wedge, 63' with SBQC   Assistive device Small based quad cane;Straight cane   Gait Pattern Step-to pattern;Decreased step length - left;Decreased dorsiflexion - right;Decreased hip/knee flexion - right;Decreased stride length;Ataxic;Right genu recurvatum   Ambulation Surface Level;Indoor   Exercises   Exercises Knee/Hip   Knee/Hip Exercises: Standing   Terminal Knee Extension Strengthening;Right;10 reps;3 sets;Theraband  red band   Theraband Level (Terminal Knee Extension) Level 2 (Red)   Terminal Knee Extension Limitations Cues for technique.  PT Education - 07/20/15 1121    Education provided Yes   Education Details Terminal knee ext HEP. PT discussed pt continuing to use R AFO/heel wedge in order to improve R DF and decr. R genu recurvatum during amb.   Person(s) Educated Patient   Methods Explanation;Demonstration;Tactile cues;Verbal cues;Handout   Comprehension Returned demonstration;Verbalized understanding          PT Short Term Goals - 07/20/15 1124    PT SHORT TERM GOAL #1   Title Pt will be IND in HEP to improve strength, balance, and endurance. Target date: 07/27/15.   Status On-going   PT SHORT TERM GOAL #2   Title Pt will improve gait speed to >/=1.61ft/sec to decrease falls risk. Target date:  07/27/15.   Status Achieved   PT SHORT TERM GOAL #3   Title Pt will ambulate 300' with LRAD, over even terrain, at MOD I level to improve functional mobility. Target date: 07/27/15.   Status On-going   PT SHORT TERM GOAL #4   Title Pt will perform TUG in </=13.5 seconds with LRAD to decr. falls risk. Target date: 07/27/15.   Status On-going   PT SHORT TERM GOAL #5   Title Perform BERG and write STG and LTG as appropriate. Target date: 07/27/15.   Status Achieved   PT SHORT TERM GOAL #6   Title Pt will improve BERG to >/=51/56 to decr. falls risk. Target date: 07/27/15.   Status On-going           PT Long Term Goals - 07/20/15 1125    PT LONG TERM GOAL #1   Title Pt will ambulate 1000' over even/uneven terrain, with LRAD, at MOD I level to improve functional mobiliity. Target date: 08/24/15.   Status On-going   PT LONG TERM GOAL #2   Title Pt will amb. 300' over even terrain without an AD, IND, to safely amb. in home. Target date: 08/24/15.   Status On-going   PT LONG TERM GOAL #3   Title Pt will improve gait speed to >/=2.25ft/sec, with LRAD, to amb. safely in the community. Target date: 08/24/15.   Status On-going   PT LONG TERM GOAL #4   Title Pt will verbalize plans to join a fitness center upon d/c from PT to continue to maintain strength and endurance gains made during PT. Target date: 08/24/15.   Status On-going   PT LONG TERM GOAL #5   Title Pt improve BERG score to >/=55/56 to decr. falls risk. Target date: 08/24/15.   Status On-going               Plan - 07/20/15 1123    Clinical Impression Statement Pt continues to experience R genu recurvatum, especially without R post. AFO and heel wedge donned. Pt demonstrated improved control during R terminal knee ext HEP, with cues. Continue with POC.   Pt will benefit from skilled therapeutic intervention in order to improve on the following deficits Abnormal gait;Decreased endurance;Impaired sensation;Decreased  knowledge of precautions;Decreased activity tolerance;Decreased knowledge of use of DME;Decreased strength;Impaired UE functional use;Impaired tone;Decreased balance;Decreased mobility;Decreased cognition;Decreased range of motion;Decreased safety awareness;Decreased coordination;Impaired flexibility   Rehab Potential Good   Clinical Impairments Affecting Rehab Potential Seizures which may limit intensity of PT   PT Frequency 2x / week   PT Duration 8 weeks   PT Treatment/Interventions ADLs/Self Care Home Management;Neuromuscular re-education;Cognitive remediation;Biofeedback;DME Instruction;Gait training;Stair training;Canalith Repostioning;Patient/family education;Orthotic Fit/Training;Balance training;Therapeutic exercise;Manual techniques;Therapeutic activities;Vestibular   PT Next Visit Plan Begin to assess STGs. Activities  to promote R sided weight bearing and weight shift, gait with focus on heel strike B, stride length,  and weight bearing on R.   PT Home Exercise Plan Flexibility and strengthening HEP   Consulted and Agree with Plan of Care Patient        Problem List Patient Active Problem List   Diagnosis Date Noted  . Nontraumatic cortical hemorrhage of cerebral hemisphere ([place])   . Seizure ([place])   . Seizures ([place])   . Adjustment disorder with depressed mood   . Contracture of muscle ankle and foot 06/11/2015  . Hemiplga fol ntrm intcrbl hemor aff right dominant side 06/06/2015  . Left-sided intracerebral hemorrhage ([place]) 06/04/2015  . Seizure disorder as sequela of cerebrovascular accident ([place])   . Seizure disorder ([place]) 06/03/2015  . Sepsis ([place]) 06/03/2015  . UTI (urinary tract infection) 06/03/2015  . Hypotension 06/02/2015  . Right spastic hemiparesis ([place]) 05/28/2015  . 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 History of anxiety disorder 05/28/2015  . ICH (intracerebral hemorrhage) ([place])   . Seizure disorder, nonconvulsive, with status  epilepticus ([place])   . Cerebral venous thrombosis of cortical vein   . Cytotoxic cerebral edema ([place])   . IVH (intraventricular hemorrhage) ([place])   . Term pregnancy 05/07/2015  . Spontaneous vaginal delivery 05/07/2015    Eleyna Brugh L 07/20/2015, 11:26 AM  [facility] [address] [place], [place], [address] Phone: [phone]   Fax:  [phone]     Geoffry Paradise, PT,DPT 07/20/2015 11:26 AM Phone: [phone] Fax: [phone]

## 2015-07-20 NOTE — Patient Instructions (Signed)
Quad Strength: Terminal Knee Extension With Tubing    With red tubing behind right knee or just above, and bring left leg back behind right leg for support, bend knee to 30. Hold onto table for support. Squeeze buttocks and use thigh muscles to straighten knee. Repeat _10___ times.. Do __3__ sessions per day. Perform every day.   [email or web address]   Copyright  VHI. All rights reserved.

## 2015-07-20 NOTE — Therapy (Addendum)
[facility] [address] [place], [place], [address] Phone: [phone]   Fax:  [phone]  Occupational Therapy Treatment  Patient Details  Name: Ashley [name] MRN: [id number] Date of Birth: Jun 08, 1976 Referring Provider:  Marton Redwood, MD  Encounter Date: 07/20/2015      OT End of Session - 07/20/15 0947    Visit Number 7   Number of Visits 17   Date for OT Re-Evaluation 08/29/15   Authorization Type [employer]   Authorization Time Period 30 visit PT/ OT combined, pt/ husband plan to self pay if needed after benefits are used   Authorization - Visit Number 7   Authorization - Number of Visits 15   OT Start Time [phone]   OT Stop Time 1015   OT Time Calculation (min) 41 min   Activity Tolerance Patient tolerated treatment well   Behavior During Therapy [place] for tasks assessed/performed      Past Medical History  Diagnosis Date  . Anxiety   . ICH (intracerebral hemorrhage) [facility])     Past Surgical History  Procedure Laterality Date  . Dilation and curettage of uterus      There were no vitals filed for this visit.  Visit Diagnosis:  Weakness due to cerebrovascular accident  Decreased coordination      Subjective Assessment - 07/20/15 0943    Subjective  Pt reports no falls since last visit   Pertinent History see epic snapshot; pt with [facility] 2 weeks after deliverying her 3rd child   Patient Stated Goals to be able to use my R arm normally   Currently in Pain? No/denies       Treatment: Quadraped lifting alternate UE's then rocking anterior/ posteriorly, min facilitation. Supine Bilateral shoulder flexion/ chest press with 3.3. Lbs ball, 2 sets of 10 reps each for increased strenght/ control, min v.c. Seated mid to high range shoulder flexion AA/ROM with PVC pipe and UE Ranger, min-mod facilitation Seated A-P weight shifts with trunk rotation and UE weightbearing min facilitation. Pt reports no falls since  last visit.                         OT Short Term Goals - 07/12/15 1149    OT SHORT TERM GOAL #1   Title I with HEP.   Baseline due 07/28/15   Time 4   Period Weeks   Status On-going   OT SHORT TERM GOAL #2   Title Pt will demonstrate impoved fine motor coordinationas evidenced by decreasing RUE 9 hole peg test score to 35 secs or less.   Time 4   Period Weeks   Status On-going   OT SHORT TERM GOAL #3   Title Pt will perform all basic ADLs at a supervision level.   Time 4   Period Weeks   Status On-going   OT SHORT TERM GOAL #4   Title Pt will use RUE as an active assist for ADLS/IADLS at least 50 % of the time.   Time 4   Period Weeks   Status On-going   OT SHORT TERM GOAL #5   Title Pt will perform basic home management/ cooking at min A level.   Time 4   Period Weeks   Status On-going           OT Long Term Goals - 07/12/15 1149    OT LONG TERM GOAL #1   Title Pt will perfom all basic ADLS modidied  independently.   Baseline due 08/27/15   Time 8   Period Weeks   Status On-going   OT LONG TERM GOAL #2   Title Pt will perform basic home management/ cooking at a supervision level demonstrating good safety awareness.   Time 8   Period Weeks   Status On-going   OT LONG TERM GOAL #3   Title Pt will demonstrate ability to retrieve a 3 lbs weight at 120 x 5 reps without dropping with RUE   Time 8   Period Weeks   Status On-going   OT LONG TERM GOAL #4   Title Pt will resume use of RUE as dominant hand at least 75% of the time for ADLs/ IADLs.   Time 8   Period Weeks   Status On-going   OT LONG TERM GOAL #5   Title Pt will demonstrate ability to perform a physical and cognitive task simultaneously with 80% or better accuracy.   Time 8   Period Weeks   Status On-going   OT LONG TERM GOAL #6   Title Pt will demonstrate improved fine motor coordination as evidenced by decreasing RUE 9 hole peg test score to 30 secs or less.   Time 8    Period Weeks   Status On-going               Plan - 07/20/15 0946    Clinical Impression Statement Pt is progressing towards goals for RUE strength and coordination.   Pt will benefit from skilled therapeutic intervention in order to improve on the following deficits (Retired) Abnormal gait;Decreased coordination;Decreased range of motion;Difficulty walking;Impaired flexibility;Decreased endurance;Decreased safety awareness;Increased edema;Impaired tone;Decreased knowledge of precautions;Decreased activity tolerance;Decreased balance;Decreased knowledge of use of DME;Pain;Impaired UE functional use;Impaired vision/preception;Decreased cognition;Decreased mobility;Decreased strength   Rehab Potential Good   OT Frequency 2x / week   OT Duration 8 weeks   OT Treatment/Interventions Self-care/ADL training;Therapeutic exercise;Patient/family education;Balance training;Ultrasound;Neuromuscular education;Manual Therapy;Splinting;Therapeutic exercises;Energy conservation;Parrafin;DME and/or AE instruction;Therapeutic activities;Cognitive remediation/compensation;Gait Training;Fluidtherapy;Electrical Stimulation;Moist Heat;Contrast Bath;Passive range of motion;Visual/perceptual remediation/compensation   Plan neuro re-ed RUE functional use   OT Home Exercise Plan issued: ball ex in supine, trunk for A-p tilts with trunk rotation(07/05/15)   Consulted and Agree with Plan of Care Patient        Problem List Patient Active Problem List   Diagnosis Date Noted  . Nontraumatic cortical hemorrhage of cerebral hemisphere ([place])   . Seizure ([place])   . Seizures ([place])   . Adjustment disorder with depressed mood   . Contracture of muscle ankle and foot 06/11/2015  . Hemiplga fol ntrm intcrbl hemor aff right dominant side 06/06/2015  . Left-sided intracerebral hemorrhage ([place]) 06/04/2015  . Seizure disorder as sequela of cerebrovascular accident ([place])   . Seizure disorder ([place]) 06/03/2015  . Sepsis  ([place]) 06/03/2015  . UTI (urinary tract infection) 06/03/2015  . Hypotension 06/02/2015  . Right spastic hemiparesis ([place]) 05/28/2015  . 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 History of anxiety disorder 05/28/2015  . ICH (intracerebral hemorrhage) ([place])   . Seizure disorder, nonconvulsive, with status epilepticus ([place])   . Cerebral venous thrombosis of cortical vein   . Cytotoxic cerebral edema ([place])   . IVH (intraventricular hemorrhage) ([place])   . Term pregnancy 05/07/2015  . Spontaneous vaginal delivery 05/07/2015    [facility] 07/20/2015, 4:42 PM Theone Murdoch, OTR/L [phone] Phone: [phone] 4:42 PM 07/20/2015 [facility] [address] [place], [place], [address] Phone: [phone]  Fax:  [phone]    

## 2015-07-20 NOTE — Therapy (Signed)
[facility] [address] [place], [place], [address] Phone: [phone]   Fax:  [phone]  Speech Language Pathology Treatment  Patient Details  Name: Ashley [name] MRN: [id number] Date of Birth: 06-07-1976 Referring Provider:  Marton Redwood, MD  Encounter Date: 07/20/2015      End of Session - 07/20/15 1116    Visit Number 7   Number of Visits 24   Date for SLP Re-Evaluation 09/12/15   Authorization - Visit Number 6   Authorization - Number of Visits 30   SLP Start Time 1016   SLP Stop Time  1100   SLP Time Calculation (min) 44 min   Activity Tolerance Patient tolerated treatment well      Past Medical History  Diagnosis Date  . Anxiety   . ICH (intracerebral hemorrhage) [facility])     Past Surgical History  Procedure Laterality Date  . Dilation and curettage of uterus      There were no vitals filed for this visit.  Visit Diagnosis: Cognitive communication deficit  Expressive aphasia      Subjective Assessment - 07/20/15 1022    Subjective "I forgot my homework. I'm sorry" "(The homework) was hard - I had to keep going back and reading over." (alternating attention)               ADULT SLP TREATMENT - 07/20/15 1020    General Information   Behavior/Cognition Alert;Cooperative;Pleasant mood   Treatment Provided   Treatment provided Cognitive-Linquistic   Pain Assessment   Pain Assessment No/denies pain   Cognitive-Linquistic Treatment   Treatment focused on Cognition   Skilled Treatment Divided attention - math computations using playing cards with mod cues consistently. Divided attention with written and auditory stimuli with mod cues from SLP usually (58%). During written and auditory task, alternating attention (switch time between writing auditory stimuli and written task) was also delayed.     Assessment / Recommendations / Plan   Plan Continue with current plan of care   Progression  Toward Goals   Progression toward goals Progressing toward goals            SLP Short Term Goals - 07/20/15 1118    SLP SHORT TERM GOAL #1   Title pt will alternate attention between simple cognitive-linguistic tasks with 85% success and WFL switch time over 3 sessions   Period --  or 12 visits, for all STGs   Status Achieved   SLP SHORT TERM GOAL #2   Title pt will demo verbal safety awareness related to deficits   Status Achieved   SLP SHORT TERM GOAL #3   Title pt will complete mod complex time, money, sequencing, calendar problems 95% with modified independence (self-correction)   Time 3   Period Weeks   Status On-going   SLP SHORT TERM GOAL #4   Title pt will demo divided attention in two simple tasks 80% of the time with rare min A   Time 3   Period Weeks   Status On-going          SLP Long Term Goals - 07/18/15 [place] #1   Title pt will divide attention between two mod complex cognitive linguistic tasks 90% each task   Time 6   Period Weeks   Status On-going   SLP LONG TERM GOAL #2   Title pt will demo anticipatory awareness by using compensatory measures for memory and/or decr'd attention x1 in  a session over 4 sessions   Time 6   Period Weeks   Status On-going   SLP LONG TERM GOAL #3   Title pt will demo executive function in a simple task with rare min A   Time 6   Period Weeks   Status On-going          Plan - 07/20/15 1116    Clinical Impression Statement Divided attention with mod complex tasks was completed with moderate difficulty requiring SLP A usually. Alternating attention between writing down auditory stimuli and a written task during the divided attention task req'd extra time. Cont skilled ST to maximize cognitive-linguistic skills to more of a premorbid level.   Speech Therapy Frequency 2x / week   Duration --  6 weeks or 10 more visits   Treatment/Interventions Cognitive reorganization;SLP instruction and  feedback;Compensatory strategies;Internal/external aids;Environmental controls;Patient/family education;Functional tasks;Cueing hierarchy;Language facilitation   Potential to Achieve Goals Good        Problem List Patient Active Problem List   Diagnosis Date Noted  . Nontraumatic cortical hemorrhage of cerebral hemisphere ([place])   . Seizure ([place])   . Seizures ([place])   . Adjustment disorder with depressed mood   . Contracture of muscle ankle and foot 06/11/2015  . Hemiplga fol ntrm intcrbl hemor aff right dominant side 06/06/2015  . Left-sided intracerebral hemorrhage ([place]) 06/04/2015  . Seizure disorder as sequela of cerebrovascular accident ([place])   . Seizure disorder ([place]) 06/03/2015  . Sepsis ([place]) 06/03/2015  . UTI (urinary tract infection) 06/03/2015  . Hypotension 06/02/2015  . Right spastic hemiparesis ([place]) 05/28/2015  . 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 History of anxiety disorder 05/28/2015  . ICH (intracerebral hemorrhage) ([place])   . Seizure disorder, nonconvulsive, with status epilepticus ([place])   . Cerebral venous thrombosis of cortical vein   . Cytotoxic cerebral edema ([place])   . IVH (intraventricular hemorrhage) ([place])   . Term pregnancy 05/07/2015  . Spontaneous vaginal delivery 05/07/2015    [facility] , MS, [place]  07/20/2015, 11:19 AM  [facility] [address] [place], [place], [address] Phone: [phone]   Fax:  [phone]

## 2015-07-20 NOTE — Patient Instructions (Signed)
  Please complete the assigned speech therapy homework and return it to your next session.  

## 2015-07-22 MED ORDER — HYDROCODONE-ACETAMINOPHEN 5-325 MG PO TABS: 1.0000 | ORAL_TABLET | Freq: Four times a day (QID) | ORAL | Status: DC | PRN

## 2015-07-23 ENCOUNTER — Telehealth: Payer: Self-pay | Admitting: Physical Medicine & Rehabilitation

## 2015-07-23 NOTE — Telephone Encounter (Deleted)
Ashley [name] would like a call back at [phone]

## 2015-07-23 NOTE — Telephone Encounter (Signed)
Ashley [name] would like for Dr. Letta Pate to call him back has questions about headache medicine and also had an issue with on call doctor this weekend.  Please call him at [phone].

## 2015-07-24 ENCOUNTER — Ambulatory Visit: Payer: BLUE CROSS/BLUE SHIELD

## 2015-07-24 ENCOUNTER — Ambulatory Visit: Payer: BLUE CROSS/BLUE SHIELD | Admitting: Occupational Therapy

## 2015-07-24 DIAGNOSIS — R29898 Other symptoms and signs involving the musculoskeletal system: Secondary | ICD-10-CM

## 2015-07-24 DIAGNOSIS — R269 Unspecified abnormalities of gait and mobility: Secondary | ICD-10-CM

## 2015-07-24 DIAGNOSIS — R41841 Cognitive communication deficit: Secondary | ICD-10-CM

## 2015-07-24 DIAGNOSIS — R4701 Aphasia: Secondary | ICD-10-CM

## 2015-07-24 DIAGNOSIS — IMO0002 Reserved for concepts with insufficient information to code with codable children: Secondary | ICD-10-CM

## 2015-07-24 DIAGNOSIS — R278 Other lack of coordination: Secondary | ICD-10-CM

## 2015-07-24 DIAGNOSIS — R279 Unspecified lack of coordination: Principal | ICD-10-CM

## 2015-07-24 MED ORDER — TOPIRAMATE 25 MG PO TABS: 25.0000 mg | ORAL_TABLET | Freq: Two times a day (BID) | ORAL | Status: DC

## 2015-07-24 NOTE — Therapy (Signed)
[facility] [address] [place], [place], [address] Phone: [phone]   Fax:  [phone]  Speech Language Pathology Treatment  Patient Details  Name: Ashley [name] MRN: [id number] Date of Birth: 1976/02/18 Referring Provider:  Marton Redwood, MD  Encounter Date: 07/24/2015      End of Session - 07/24/15 1710    Visit Number 8   Number of Visits 24   Date for SLP Re-Evaluation 09/12/15   Authorization - Visit Number 7   Authorization - Number of Visits 30   SLP Start Time 9678   SLP Stop Time  1100   SLP Time Calculation (min) 42 min   Activity Tolerance Patient tolerated treatment well      Past Medical History  Diagnosis Date  . Anxiety   . ICH (intracerebral hemorrhage) [facility])     Past Surgical History  Procedure Laterality Date  . Dilation and curettage of uterus      There were no vitals filed for this visit.  Visit Diagnosis: Cognitive communication deficit  Expressive aphasia      Subjective Assessment - 07/24/15 1058    Subjective Pt brought homework from previous sessions. She reports feeling "fuzzy" today due to Vicodin for headaches at 8 pm last night.               ADULT SLP TREATMENT - 07/24/15 1058    General Information   Behavior/Cognition Alert;Cooperative;Pleasant mood   Treatment Provided   Treatment provided Cognitive-Linquistic   Pain Assessment   Pain Assessment No/denies pain   Cognitive-Linquistic Treatment   Treatment focused on Cognition   Skilled Treatment Divided attention - detailed instructions (channel guide and checkbook task) with news stories. Pt with minimal success, and sustained her attention on the written task. Alternating attention predominated. Homework for divided attention.   Assessment / Recommendations / Plan   Plan Continue with current plan of care   Progression Toward Goals   Progression toward goals Progressing toward goals             SLP Short Term Goals - 07/24/15 1716    SLP SHORT TERM GOAL #1   Title pt will alternate attention between simple cognitive-linguistic tasks with 85% success and WFL switch time over 3 sessions   Period --  or 12 visits, for all STGs   Status Achieved   SLP SHORT TERM GOAL #2   Title pt will demo verbal safety awareness related to deficits   Status Achieved   SLP SHORT TERM GOAL #3   Title pt will complete mod complex time, money, sequencing, calendar problems 95% with modified independence (self-correction)   Time 2   Period Weeks   Status On-going   SLP SHORT TERM GOAL #4   Title pt will demo divided attention in two simple tasks 80% of the time with rare min A   Time 2   Period Weeks   Status On-going          SLP Long Term Goals - 07/24/15 1716    SLP LONG TERM GOAL #1   Title pt will divide attention between two mod complex cognitive linguistic tasks 90% each task   Time 5   Period Weeks   Status On-going   SLP LONG TERM GOAL #2   Title pt will demo anticipatory awareness by using compensatory measures for memory and/or decr'd attention x1 in a session over 4 sessions   Time 5   Period Weeks   Status  On-going   SLP LONG TERM GOAL #3   Title pt will demo executive function in a simple task with rare min A   Time 5   Period Weeks   Status On-going          Plan - 07/24/15 1711    Clinical Impression Statement Limited chanage with divided attention skills today. SLP learned that pt is mostly alternating attention with divided attention homework. SLP simplified homework this session to give pt better chance of making some divided attention. SLP did not note any expressive aphasia in today's session. Slkilled ST needs to cont to incr pt's cognitive linguistic skills neareer to baseline.   Speech Therapy Frequency 2x / week   Duration --  5 weeks or 9 more visits   Treatment/Interventions Cognitive reorganization;SLP instruction and  feedback;Compensatory strategies;Internal/external aids;Environmental controls;Patient/family education;Functional tasks;Cueing hierarchy;Language facilitation   Potential to Achieve Goals Good   Potential Considerations Ability to learn/carryover information        Problem List Patient Active Problem List   Diagnosis Date Noted  . Nontraumatic cortical hemorrhage of cerebral hemisphere ([place])   . Seizure ([place])   . Seizures ([place])   . Adjustment disorder with depressed mood   . Contracture of muscle ankle and foot 06/11/2015  . Hemiplga fol ntrm intcrbl hemor aff right dominant side 06/06/2015  . Left-sided intracerebral hemorrhage ([place]) 06/04/2015  . Seizure disorder as sequela of cerebrovascular accident ([place])   . Seizure disorder ([place]) 06/03/2015  . Sepsis ([place]) 06/03/2015  . UTI (urinary tract infection) 06/03/2015  . Hypotension 06/02/2015  . Right spastic hemiparesis ([place]) 05/28/2015  . 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 History of anxiety disorder 05/28/2015  . ICH (intracerebral hemorrhage) ([place])   . Seizure disorder, nonconvulsive, with status epilepticus ([place])   . Cerebral venous thrombosis of cortical vein   . Cytotoxic cerebral edema ([place])   . IVH (intraventricular hemorrhage) ([place])   . Term pregnancy 05/07/2015  . Spontaneous vaginal delivery 05/07/2015    [facility] , [place], [place]  07/24/2015, 5:18 PM  [facility] [address] [place], [place], [address] Phone: [phone]   Fax:  [phone]

## 2015-07-24 NOTE — Therapy (Signed)
[facility] [address] [place], [place], [address] Phone: [phone]   Fax:  [phone]  Occupational Therapy Treatment  Patient Details  Name: Ashley [name] MRN: [id number] Date of Birth: [birth year] Referring Provider:  Marton Redwood, MD  Encounter Date: 07/24/2015      OT End of Session - 07/24/15 0941    Visit Number 8   Number of Visits 17   Date for OT Re-Evaluation 08/29/15   Authorization Type BCBS   Authorization Time Period 30 visit PT/ OT combined, pt/ husband plan to self pay if needed after benefits are used   Authorization - Visit Number 8   Authorization - Number of Visits 15   OT Start Time [phone]   OT Stop Time 1015   OT Time Calculation (min) 41 min   Activity Tolerance Patient tolerated treatment well   Behavior During Therapy [facility] for tasks assessed/performed      Past Medical History  Diagnosis Date  . Anxiety   . ICH (intracerebral hemorrhage) [facility])     Past Surgical History  Procedure Laterality Date  . Dilation and curettage of uterus      There were no vitals filed for this visit.  Visit Diagnosis:  Decreased coordination  Weakness due to cerebrovascular accident      Subjective Assessment - 07/24/15 0936    Pertinent History see epic snapshot; pt with SAH 2 weeks after deliverying her 3rd child   Patient Stated Goals to be able to use my R arm normally   Currently in Pain? Yes   Pain Score 2    Pain Location Head   Pain Descriptors / Indicators Aching   Pain Type Acute pain   Pain Onset Today   Pain Frequency Constant   Aggravating Factors  unknown   Pain Relieving Factors vicodin   Multiple Pain Sites No        Treatment:Supine Bilateral shoulder flexion/ chest press with 3.3. Lbs ball, 2 sets of 10 reps each for increased strength/ control, min v.c. Seated mid to high range shoulder flexion AA/ROM with PVC pipe , min facilitation Seated A-P weight shifts with  trunk rotation and UE weightbearing min facilitation. Seated functional mid range reaching to copy small peg design with RUE, min difficulty/ v.c., removing with emphasis on in hand manipulation Pt reports bad migrane over the weekend and she has been taking vicodin, pt reports that the vicodin makes her nauseous, and swimmy headed.                        OT Short Term Goals - 07/12/15 1149    OT SHORT TERM GOAL #1   Title I with HEP.   Baseline due 07/28/15   Time 4   Period Weeks   Status On-going   OT SHORT TERM GOAL #2   Title Pt will demonstrate impoved fine motor coordinationas evidenced by decreasing RUE 9 hole peg test score to 35 secs or less.   Time 4   Period Weeks   Status On-going   OT SHORT TERM GOAL #3   Title Pt will perform all basic ADLs at a supervision level.   Time 4   Period Weeks   Status On-going   OT SHORT TERM GOAL #4   Title Pt will use RUE as an active assist for ADLS/IADLS at least 50 % of the time.   Time 4   Period Weeks   Status On-going  OT SHORT TERM GOAL #5   Title Pt will perform basic home management/ cooking at min A level.   Time 4   Period Weeks   Status On-going           OT Long Term Goals - 07/12/15 1149    OT LONG TERM GOAL #1   Title Pt will perfom all basic ADLS modidied independently.   Baseline due 08/27/15   Time 8   Period Weeks   Status On-going   OT LONG TERM GOAL #2   Title Pt will perform basic home management/ cooking at a supervision level demonstrating good safety awareness.   Time 8   Period Weeks   Status On-going   OT LONG TERM GOAL #3   Title Pt will demonstrate ability to retrieve a 3 lbs weight at 120 x 5 reps without dropping with RUE   Time 8   Period Weeks   Status On-going   OT LONG TERM GOAL #4   Title Pt will resume use of RUE as dominant hand at least 75% of the time for ADLs/ IADLs.   Time 8   Period Weeks   Status On-going   OT LONG TERM GOAL #5   Title Pt will  demonstrate ability to perform a physical and cognitive task simultaneously with 80% or better accuracy.   Time 8   Period Weeks   Status On-going   OT LONG TERM GOAL #6   Title Pt will demonstrate improved fine motor coordination as evidenced by decreasing RUE 9 hole peg test score to 30 secs or less.   Time 8   Period Weeks   Status On-going               Plan - 07/24/15 1021    Clinical Impression Statement Pt is progressing towards goals with improving RUE strength and coordination.   Pt will benefit from skilled therapeutic intervention in order to improve on the following deficits (Retired) Abnormal gait;Decreased coordination;Decreased range of motion;Difficulty walking;Impaired flexibility;Decreased endurance;Decreased safety awareness;Increased edema;Impaired tone;Decreased knowledge of precautions;Decreased activity tolerance;Decreased balance;Decreased knowledge of use of DME;Pain;Impaired UE functional use;Impaired vision/preception;Decreased cognition;Decreased mobility;Decreased strength   Rehab Potential Good   OT Frequency 2x / week   OT Duration 8 weeks   OT Treatment/Interventions Self-care/ADL training;Therapeutic exercise;Patient/family education;Balance training;Ultrasound;Neuromuscular education;Manual Therapy;Splinting;Therapeutic exercises;Energy conservation;Parrafin;DME and/or AE instruction;Therapeutic activities;Cognitive remediation/compensation;Gait Training;Fluidtherapy;Electrical Stimulation;Moist Heat;Contrast Bath;Passive range of motion;Visual/perceptual remediation/compensation   OT Home Exercise Plan issued: ball ex in supine, trunk for A-p tilts with trunk rotation(07/05/15)   Consulted and Agree with Plan of Care Patient        Problem List Patient Active Problem List   Diagnosis Date Noted  . Nontraumatic cortical hemorrhage of cerebral hemisphere ([place])   . Seizure ([place])   . Seizures ([place])   . Adjustment disorder with depressed mood   .  Contracture of muscle ankle and foot 06/11/2015  . Hemiplga fol ntrm intcrbl hemor aff right dominant side 06/06/2015  . Left-sided intracerebral hemorrhage ([place]) 06/04/2015  . Seizure disorder as sequela of cerebrovascular accident ([place])   . Seizure disorder ([place]) 06/03/2015  . Sepsis ([place]) 06/03/2015  . UTI (urinary tract infection) 06/03/2015  . Hypotension 06/02/2015  . Right spastic hemiparesis ([place]) 05/28/2015  . 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 History of anxiety disorder 05/28/2015  . ICH (intracerebral hemorrhage) ([place])   . Seizure disorder, nonconvulsive, with status epilepticus ([place])   . Cerebral venous thrombosis of cortical vein   .  Cytotoxic cerebral edema ([place])   . IVH (intraventricular hemorrhage) ([place])   . Term pregnancy 05/07/2015  . Spontaneous vaginal delivery 05/07/2015    Mallie Giambra 07/24/2015, 10:23 AM Theone Murdoch, OTR/L [phone] Phone: [phone] 10:23 AM 07/24/2015 [facility] [address] [place], [place], [address] Phone: [phone]   Fax:  [phone]

## 2015-07-24 NOTE — Telephone Encounter (Signed)
Per Dr Letta Pate d/c hydrocodone, topamax 25 mg bid.  Order sent to pharmacy and Ashley [name] notified

## 2015-07-25 NOTE — Therapy (Signed)
[facility] [address] [place], [place], [address] Phone: [phone]   Fax:  [phone]  Physical Therapy Treatment  Patient Details  Name: Ashley [name] MRN: [id number] Date of Birth: 10-20-1975 Referring Provider:  Marton Redwood, MD  Encounter Date: 07/24/2015      PT End of Session - 07/25/15 0938    Visit Number 8   Number of Visits 17   Date for PT Re-Evaluation 08/28/15   Authorization Type BCBS. Per Laverda Page, pt gets 30 visits for PT and 30 for OT.   Authorization - Visit Number 8   Authorization - Number of Visits 30   PT Start Time [phone]   PT Stop Time 0929   PT Time Calculation (min) 43 min   Equipment Utilized During Treatment Gait belt   Activity Tolerance Patient tolerated treatment well   Behavior During Therapy WFL for tasks assessed/performed      Past Medical History  Diagnosis Date  . Anxiety   . ICH (intracerebral hemorrhage) [facility])     Past Surgical History  Procedure Laterality Date  . Dilation and curettage of uterus      There were no vitals filed for this visit.  Visit Diagnosis:  Abnormality of gait  Right leg weakness      Subjective Assessment - 07/24/15 0848    Subjective Pt reported she started Vicodin yesterday due to incr. head pain. Pt states it makes her feel nauseated and is going to inform MD but it's decr. head pain.   Patient Stated Goals "Be as close to normal as much as possible and to go back to running" Get back to taking care of my kids, 70 weeks old and 32 and 46 years old   Currently in Pain? No/denies                         [facility] Adult PT Treatment/Exercise - 07/24/15 0850    Ambulation/Gait   Ambulation/Gait Yes   Ambulation/Gait Assistance 6: Modified independent (Device/Increase time)   Ambulation/Gait Assistance Details Pt amb. over even surfaces, with incr. stride length. Pt amb. over uneven terrain ourdoors with S and min guard,  cues to improve weight shifting on inclines/declines, improve L heel strike, and improve wt. shifting onto R LE.   Ambulation Distance (Feet) 345 Feet  indoors and 500' outdoors   Assistive device Straight cane   Gait Pattern Decreased step length - left;Decreased dorsiflexion - right;Decreased hip/knee flexion - right;Decreased stride length;Ataxic;Right genu recurvatum;Step-through pattern   Ambulation Surface Level;Indoor;Unlevel;Outdoor;Paved   Gait velocity 2.23f/sec.  with [facility]  Standardized Balance Assessment   Standardized Balance Assessment Berg Balance Test;Timed Up and Go Test   Berg Balance Test   Sit to Stand Able to stand without using hands and stabilize independently   Standing Unsupported Able to stand safely 2 minutes   Sitting with Back Unsupported but Feet Supported on Floor or Stool Able to sit safely and securely 2 minutes   Stand to Sit Sits safely with minimal use of hands   Transfers Able to transfer safely, minor use of hands   Standing Unsupported with Eyes Closed Able to stand 10 seconds safely   Standing Ubsupported with Feet Together Able to place feet together independently and stand 1 minute safely   From Standing, Reach Forward with Outstretched Arm Can reach forward >12 cm safely (5")   From Standing Position, Pick up Object from Floor Able to pick  up shoe safely and easily   From Standing Position, Turn to Look Behind Over each Shoulder Looks behind one side only/other side shows less weight shift   Turn 360 Degrees Able to turn 360 degrees safely but slowly   Standing Unsupported, Alternately Place Feet on Step/Stool Able to complete 4 steps without aid or supervision   Standing Unsupported, One Foot in Front Able to place foot tandem independently and hold 30 seconds   Standing on One Leg Able to lift leg independently and hold 5-10 seconds  6 seconds on L LE and    Total Score 49   Timed Up and Go Test   TUG Normal TUG   Normal TUG (seconds) 18.9   with SPC and 16.7 sec.                PT Education - 07/25/15 0937    Education provided Yes   Education Details PT educated pt on goal progress.   Person(s) Educated Patient   Methods Explanation   Comprehension Verbalized understanding          PT Short Term Goals - 07/25/15 0941    PT SHORT TERM GOAL #1   Title Pt will be IND in HEP to improve strength, balance, and endurance. Target date: 07/27/15.   Status On-going   PT SHORT TERM GOAL #2   Title Pt will improve gait speed to >/=1.8ft/sec to decrease falls risk. Target date: 07/27/15.   Status Achieved   PT SHORT TERM GOAL #3   Title Pt will ambulate 300' with LRAD, over even terrain, at MOD I level to improve functional mobility. Target date: 07/27/15.   Status Achieved   PT SHORT TERM GOAL #4   Title Pt will perform TUG in </=13.5 seconds with LRAD to decr. falls risk. Target date: 07/27/15.   Status Partially Met   PT SHORT TERM GOAL #5   Title Perform BERG and write STG and LTG as appropriate. Target date: 07/27/15.   Status Achieved   PT SHORT TERM GOAL #6   Title Pt will improve BERG to >/=51/56 to decr. falls risk. Target date: 07/27/15.   Status Partially Met           PT Long Term Goals - 07/20/15 1125    PT LONG TERM GOAL #1   Title Pt will ambulate 1000' over even/uneven terrain, with LRAD, at MOD I level to improve functional mobiliity. Target date: 08/24/15.   Status On-going   PT LONG TERM GOAL #2   Title Pt will amb. 300' over even terrain without an AD, IND, to safely amb. in home. Target date: 08/24/15.   Status On-going   PT LONG TERM GOAL #3   Title Pt will improve gait speed to >/=2.62ft/sec, with LRAD, to amb. safely in the community. Target date: 08/24/15.   Status On-going   PT LONG TERM GOAL #4   Title Pt will verbalize plans to join a fitness center upon d/c from PT to continue to maintain strength and endurance gains made during PT. Target date: 08/24/15.   Status On-going    PT LONG TERM GOAL #5   Title Pt improve BERG score to >/=55/56 to decr. falls risk. Target date: 08/24/15.   Status On-going               Plan - 07/25/15 0939    Clinical Impression Statement Pt demonstrated progress as she met STGs 2 and 3, and partially met STGs 4 and 6. Pt   was limited today by nausea, and required seated rest breaks during first half of session in order to drink water and apply cold washcloth to her neck to decr. nausea. Nausea could be due to new pain medication, pt reported her husband is contacting MD to notify of side effects. Continue with POC.   Pt will benefit from skilled therapeutic intervention in order to improve on the following deficits Abnormal gait;Decreased endurance;Impaired sensation;Decreased knowledge of precautions;Decreased activity tolerance;Decreased knowledge of use of DME;Decreased strength;Impaired UE functional use;Impaired tone;Decreased balance;Decreased mobility;Decreased cognition;Decreased range of motion;Decreased safety awareness;Decreased coordination;Impaired flexibility   Rehab Potential Good   Clinical Impairments Affecting Rehab Potential Seizures which may limit intensity of PT   PT Frequency 2x / week   PT Duration 8 weeks   PT Treatment/Interventions ADLs/Self Care Home Management;Neuromuscular re-education;Cognitive remediation;Biofeedback;DME Instruction;Gait training;Stair training;Canalith Repostioning;Patient/family education;Orthotic Fit/Training;Balance training;Therapeutic exercise;Manual techniques;Therapeutic activities;Vestibular   PT Next Visit Plan Finish assessing STG (HEP) and progress as tolerated.   PT Home Exercise Plan Flexibility and strengthening HEP   Consulted and Agree with Plan of Care Patient        Problem List Patient Active Problem List   Diagnosis Date Noted  . Nontraumatic cortical hemorrhage of cerebral hemisphere ([place])   . Seizure ([place])   . Seizures ([place])   . Adjustment disorder with  depressed mood   . Contracture of muscle ankle and foot 06/11/2015  . Hemiplga fol ntrm intcrbl hemor aff right dominant side 06/06/2015  . Left-sided intracerebral hemorrhage ([place]) 06/04/2015  . Seizure disorder as sequela of cerebrovascular accident ([place])   . Seizure disorder ([place]) 06/03/2015  . Sepsis ([place]) 06/03/2015  . UTI (urinary tract infection) 06/03/2015  . Hypotension 06/02/2015  . Right spastic hemiparesis ([place]) 05/28/2015  . 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 History of anxiety disorder 05/28/2015  . ICH (intracerebral hemorrhage) ([place])   . Seizure disorder, nonconvulsive, with status epilepticus ([place])   . Cerebral venous thrombosis of cortical vein   . Cytotoxic cerebral edema ([place])   . IVH (intraventricular hemorrhage) ([place])   . Term pregnancy 05/07/2015  . Spontaneous vaginal delivery 05/07/2015    Levonia Wolfley L 07/25/2015, 9:43 AM  [facility] [address] [place], [place], [address] Phone: [phone]   Fax:  [phone]     Geoffry Paradise, PT,DPT 07/25/2015 9:43 AM Phone: [phone] Fax: [phone]

## 2015-07-26 ENCOUNTER — Ambulatory Visit: Payer: BLUE CROSS/BLUE SHIELD

## 2015-07-26 ENCOUNTER — Ambulatory Visit: Payer: BLUE CROSS/BLUE SHIELD | Admitting: Occupational Therapy

## 2015-07-26 DIAGNOSIS — R269 Unspecified abnormalities of gait and mobility: Secondary | ICD-10-CM | POA: Diagnosis not present

## 2015-07-26 DIAGNOSIS — R29898 Other symptoms and signs involving the musculoskeletal system: Secondary | ICD-10-CM

## 2015-07-26 DIAGNOSIS — R279 Unspecified lack of coordination: Principal | ICD-10-CM

## 2015-07-26 DIAGNOSIS — R41841 Cognitive communication deficit: Secondary | ICD-10-CM

## 2015-07-26 DIAGNOSIS — IMO0002 Reserved for concepts with insufficient information to code with codable children: Secondary | ICD-10-CM

## 2015-07-26 DIAGNOSIS — R278 Other lack of coordination: Secondary | ICD-10-CM

## 2015-07-26 NOTE — Therapy (Signed)
[facility] [address] [place], [place], [address] Phone: [phone]   Fax:  [phone]  Occupational Therapy Treatment  Patient Details  Name: Ashley [name] MRN: [id number] Date of Birth: 1976-04-26 Referring Provider:  Marton Redwood, MD  Encounter Date: 07/26/2015      OT End of Session - 07/26/15 1333    Visit Number 9   Number of Visits 17   Date for OT Re-Evaluation 08/29/15   Authorization Time Period 30 visit PT/ OT combined, pt/ husband plan to self pay if needed after benefits are used   Authorization - Visit Number 9   OT Start Time 1320   OT Stop Time 1400   OT Time Calculation (min) 40 min   Activity Tolerance Patient tolerated treatment well   Behavior During Therapy [facility] for tasks assessed/performed      Past Medical History  Diagnosis Date  . Anxiety   . ICH (intracerebral hemorrhage) [facility])     Past Surgical History  Procedure Laterality Date  . Dilation and curettage of uterus      There were no vitals filed for this visit.  Visit Diagnosis:  Decreased coordination  Weakness due to cerebrovascular accident  Coordination impairment      Subjective Assessment - 07/26/15 1326    Subjective  Pt reports her headache and nausea are under control   Pertinent History see epic snapshot; pt with SAH 2 weeks after deliverying her 3rd child   Patient Stated Goals to be able to use my R arm normally   Currently in Pain? No/denies             Treatment: A/ROM shoulder flexion in supine with PVC pipe frame, min facilitation. supine shoulder flexion and chest press, 2 sets of 10 reps with 3 lbs weight and bilateral UE's.  Triceps extension with RUE using 1 lbs weight,  in supine x 10 reps, min facilitation/ v.c. Seated AAROM with PVC pipe frame, min facilitation at right elbow/ scapula to mid- high level Therapist checked progress towards short term goals. Pt demonstrates excellent  overall progress.                   OT Short Term Goals - 07/26/15 1333    OT SHORT TERM GOAL #1   Title I with HEP.   Status Achieved   OT SHORT TERM GOAL #2   Title Pt will demonstrate impoved fine motor coordinationas evidenced by decreasing RUE 9 hole peg test score to 35 secs or less.   Baseline 28.44 secs   Status Achieved   OT SHORT TERM GOAL #3   Title Pt will perform all basic ADLs at a supervision level.   Status Achieved   OT SHORT TERM GOAL #4   Title Pt will use RUE as an active assist for ADLS/IADLS at least 50 % of the time.   Status Achieved   OT SHORT TERM GOAL #5   Title Pt will perform basic home management/ cooking at min A level.   Status Achieved           OT Long Term Goals - 07/12/15 1149    OT LONG TERM GOAL #1   Title Pt will perfom all basic ADLS modidied independently.   Baseline due 08/27/15   Time 8   Period Weeks   Status On-going   OT LONG TERM GOAL #2   Title Pt will perform basic home management/ cooking at a supervision level demonstrating  good safety awareness.   Time 8   Period Weeks   Status On-going   OT LONG TERM GOAL #3   Title Pt will demonstrate ability to retrieve a 3 lbs weight at 120 x 5 reps without dropping with RUE   Time 8   Period Weeks   Status On-going   OT LONG TERM GOAL #4   Title Pt will resume use of RUE as dominant hand at least 75% of the time for ADLs/ IADLs.   Time 8   Period Weeks   Status On-going   OT LONG TERM GOAL #5   Title Pt will demonstrate ability to perform a physical and cognitive task simultaneously with 80% or better accuracy.   Time 8   Period Weeks   Status On-going   OT LONG TERM GOAL #6   Title Pt will demonstrate improved fine motor coordination as evidenced by decreasing RUE 9 hole peg test score to 30 secs or less.   Time 8   Period Weeks   Status On-going               Plan - 07/26/15 1332    Clinical Impression Statement Pt is progressing towards  goals. Her headache is managed today with meds.    Pt will benefit from skilled therapeutic intervention in order to improve on the following deficits (Retired) Abnormal gait;Decreased coordination;Decreased range of motion;Difficulty walking;Impaired flexibility;Decreased endurance;Decreased safety awareness;Increased edema;Impaired tone;Decreased knowledge of precautions;Decreased activity tolerance;Decreased balance;Decreased knowledge of use of DME;Pain;Impaired UE functional use;Impaired vision/preception;Decreased cognition;Decreased mobility;Decreased strength   Rehab Potential Good   OT Frequency 2x / week   OT Duration 8 weeks   OT Treatment/Interventions Self-care/ADL training;Therapeutic exercise;Patient/family education;Balance training;Ultrasound;Neuromuscular education;Manual Therapy;Splinting;Therapeutic exercises;Energy conservation;Parrafin;DME and/or AE instruction;Therapeutic activities;Cognitive remediation/compensation;Gait Training;Fluidtherapy;Electrical Stimulation;Moist Heat;Contrast Bath;Passive range of motion;Visual/perceptual remediation/compensation   Plan neuro re-ed RUE functional use   OT Home Exercise Plan issued: ball ex in supine, trunk for A-p tilts with trunk rotation(07/05/15)   Consulted and Agree with Plan of Care Patient        Problem List Patient Active Problem List   Diagnosis Date Noted  . Nontraumatic cortical hemorrhage of cerebral hemisphere ([place])   . Seizure ([place])   . Seizures ([place])   . Adjustment disorder with depressed mood   . Contracture of muscle ankle and foot 06/11/2015  . Hemiplga fol ntrm intcrbl hemor aff right dominant side 06/06/2015  . Left-sided intracerebral hemorrhage ([place]) 06/04/2015  . Seizure disorder as sequela of cerebrovascular accident ([place])   . Seizure disorder ([place]) 06/03/2015  . Sepsis ([place]) 06/03/2015  . UTI (urinary tract infection) 06/03/2015  . Hypotension 06/02/2015  . Right spastic hemiparesis ([place])  05/28/2015  . 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 History of anxiety disorder 05/28/2015  . ICH (intracerebral hemorrhage) ([place])   . Seizure disorder, nonconvulsive, with status epilepticus ([place])   . Cerebral venous thrombosis of cortical vein   . Cytotoxic cerebral edema ([place])   . IVH (intraventricular hemorrhage) ([place])   . Term pregnancy 05/07/2015  . Spontaneous vaginal delivery 05/07/2015    Susanna Benge 07/26/2015, 5:46 PM  [facility] [address] [place], [place], [address] Phone: [phone]   Fax:  [phone]

## 2015-07-26 NOTE — Patient Instructions (Addendum)
Perform stretches every day, even on PT days.  ANKLE: Dorsiflexion - Sitting   Sitting, place strap around foot. Pull foot toward body, keeping heel on floor. Keep foot straight. Hold 30 seconds. 2 reps per set, 2 sets per day.  Copyright  VHI. All rights reserved.   ANKLE: Dorsiflexion - Standing   Place hand on wall for support. Place left leg in front of other. Keep right knee straight and press back heel into floor. Hold 30 seconds. 2 reps per set, 2 sets per day.  Copyright  VHI. All rights reserved.    HIP: Hamstrings - Short Sitting     Place Right heel on floor, not on stool. Sit at edge of seat, spine straight, right leg extended. Put a hand on each thigh and bend forward from the hip, keeping spine straight (bending at hips only). Support upper body with other arm. Hold 30 seconds. Repeat 2 times per session. Do 2 sessions per day.  Copyright  VHI. All rights reserved.   ANKLE: Dorsiflexion (Band)   Sit at edge of surface. NO BAND for now. Keeping heel on floor, raise toes of right foot. Hold _2__ seconds.  _10__ reps per set, _3__ sets per day, _7__ days per week. WHEN THIS BECOMES EASY, ADD YELLOW THERABAND AND NOTIFY PT.   Copyright  VHI. All rights reserved.    ANKLE: Eversion, Right foot only.   Sit at edge of surface, feet on floor. Raise toes of right foot up and move them away from body. Do not move hips or knees. _10__ reps per set, _2__ sets per day, _7__ days per week. Have someone assist you as needed.  Perform this with Left foot first.   Copyright  VHI. All rights reserved.   FUNCTIONAL MOBILITY: Squat   Stance: shoulder-width on floor with a chair behind you, place 2 inch block under Left leg to increase weight bearing on Right leg. Bend hips and knees. Keep back straight. Do not allow knees to bend past toes. Squeeze glutes and quads to stand. _10__ reps per set, _3__ sets per day, _3-4__ days per week  Copyright  VHI. All rights  reserved.   Hamstrings   Lie on stomach with no weight around right ankle. Bring heel towards your buttom, pointing toes toward knee. Do not bend hips. Hold __2__ seconds. Slowly lower foot down (have someone assist you as needed to control foot going down). Repeat __10__ times. Do __3__ sessions per day. Perform 3-4 days a week. CAUTION: Move slowly.  Copyright  VHI. All rights reserved.    Bridge   Lie back, legs bent. Inhale, pressing hips up, while tucking in your stomach and hold for 2 seconds. Keeping ribs in, lengthen lower back. Exhale, rolling down along spine from top. Repeat __20__ times. Do __1__ sessions per day.  Copyright  VHI. All rights reserved.

## 2015-07-26 NOTE — Patient Instructions (Signed)
  Please complete the assigned speech therapy homework and return it to your next session.  

## 2015-07-26 NOTE — Therapy (Signed)
[facility] [address] [place], [place], [address] Phone: [phone]   Fax:  [phone]  Speech Language Pathology Treatment  Patient Details  Name: Ashley [name] MRN: [id number] Date of Birth: 03-18-1976 Referring Provider:  Marton Redwood, MD  Encounter Date: 07/26/2015      End of Session - 07/26/15 1636    Visit Number 9   Number of Visits 24   Date for SLP Re-Evaluation 09/12/15   Authorization - Visit Number 8   Authorization - Number of Visits 77   SLP Start Time 6962   SLP Stop Time  9528   SLP Time Calculation (min) 42 min      Past Medical History  Diagnosis Date  . Anxiety   . ICH (intracerebral hemorrhage) [facility])     Past Surgical History  Procedure Laterality Date  . Dilation and curettage of uterus      There were no vitals filed for this visit.  Visit Diagnosis: Cognitive communication deficit      Subjective Assessment - 07/26/15 1501    Subjective Pt tried to do homework for divided attention but too challenging with kids during her homework time too, in room next to pt.               ADULT SLP TREATMENT - 07/26/15 1502    General Information   Behavior/Cognition Alert;Cooperative;Pleasant mood   Treatment Provided   Treatment provided Cognitive-Linquistic   Pain Assessment   Pain Assessment No/denies pain   Cognitive-Linquistic Treatment   Treatment focused on Cognition   Skilled Treatment Simple time and money problems completed with extra time, calculator, with 83% success. Pt was aware of errors 50% of the time. Divided attention task (written and auditory) pt with 100% success with written and tracked 70% of auditory information. SLP req'd to provide min nonverbal cues occasionally for incr'd success with auditory task.    Assessment / Recommendations / Plan   Plan Continue with current plan of care   Progression Toward Goals   Progression toward goals Progressing  toward goals            SLP Short Term Goals - 07/26/15 1523    SLP SHORT TERM GOAL #1   Title pt will alternate attention between simple cognitive-linguistic tasks with 85% success and WFL switch time over 3 sessions   Period --  or 12 visits, for all STGs   Status Achieved   SLP SHORT TERM GOAL #2   Title pt will demo verbal safety awareness related to deficits   Status Achieved   SLP SHORT TERM GOAL #3   Title pt will complete mod complex time, money, sequencing, calendar problems 95% with modified independence (self-correction)   Time 2   Period Weeks   Status On-going   SLP SHORT TERM GOAL #4   Title pt will demo divided attention in two simple tasks 80% of the time with rare min A   Time 2   Period Weeks   Status On-going          SLP Long Term Goals - 07/26/15 1639    SLP LONG TERM GOAL #1   Title pt will divide attention between two mod complex cognitive linguistic tasks 90% each task   Time 5   Period Weeks   Status On-going   SLP LONG TERM GOAL #2   Title pt will demo anticipatory awareness by using compensatory measures for memory and/or decr'd attention x1 in a session  over 4 sessions   Time 5   Period Weeks   Status On-going   SLP LONG TERM GOAL #3   Title pt will demo executive function in a simple task with rare min A   Time 5   Period Weeks   Status On-going          Plan - 07/26/15 1636    Clinical Impression Statement Pt's awareness of severity of divided attention deficit with liguistic tasks appears to have increased, as pt appears more alarmed at her performance than in previous sessions following divided attention tasks. Slkilled ST needs to cont to incr pt's cognitive linguistic skills neareer to baseline.   Speech Therapy Frequency 2x / week   Duration --  5 weeks or 8 more visits   Treatment/Interventions Cognitive reorganization;SLP instruction and feedback;Compensatory strategies;Internal/external aids;Environmental  controls;Patient/family education;Functional tasks;Cueing hierarchy;Language facilitation   Potential to Achieve Goals Good   Potential Considerations Ability to learn/carryover information        Problem List Patient Active Problem List   Diagnosis Date Noted  . Nontraumatic cortical hemorrhage of cerebral hemisphere ([place])   . Seizure ([place])   . Seizures ([place])   . Adjustment disorder with depressed mood   . Contracture of muscle ankle and foot 06/11/2015  . Hemiplga fol ntrm intcrbl hemor aff right dominant side 06/06/2015  . Left-sided intracerebral hemorrhage ([place]) 06/04/2015  . Seizure disorder as sequela of cerebrovascular accident ([place])   . Seizure disorder ([place]) 06/03/2015  . Sepsis ([place]) 06/03/2015  . UTI (urinary tract infection) 06/03/2015  . Hypotension 06/02/2015  . Right spastic hemiparesis ([place]) 05/28/2015  . 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 History of anxiety disorder 05/28/2015  . ICH (intracerebral hemorrhage) ([place])   . Seizure disorder, nonconvulsive, with status epilepticus ([place])   . Cerebral venous thrombosis of cortical vein   . Cytotoxic cerebral edema ([place])   . IVH (intraventricular hemorrhage) ([place])   . Term pregnancy 05/07/2015  . Spontaneous vaginal delivery 05/07/2015    [facility] , Luis M. Cintron, [place]  07/26/2015, 4:39 PM  [facility] [address] [place], [place], [address] Phone: [phone]   Fax:  [phone]

## 2015-07-26 NOTE — Therapy (Signed)
[facility] [address] [place], [place], [address] Phone: [phone]   Fax:  [phone]  Physical Therapy Treatment  Patient Details  Name: Ashley [name] MRN: [id number] Date of Birth: 01-31-1976 Referring Provider:  Marton Redwood, MD  Encounter Date: 07/26/2015      PT End of Session - 07/26/15 1449    Visit Number 9   Number of Visits 17   Date for PT Re-Evaluation 08/28/15   Authorization Type BCBS. Per Laverda Page, pt gets 30 visits for PT and 30 for OT.   Authorization - Visit Number 9   Authorization - Number of Visits 30   PT Start Time 1594   PT Stop Time 1446   PT Time Calculation (min) 42 min   Activity Tolerance Patient tolerated treatment well   Behavior During Therapy WFL for tasks assessed/performed      Past Medical History  Diagnosis Date  . Anxiety   . ICH (intracerebral hemorrhage) [facility])     Past Surgical History  Procedure Laterality Date  . Dilation and curettage of uterus      There were no vitals filed for this visit.  Visit Diagnosis:  Right leg weakness  Abnormality of gait      Subjective Assessment - 07/26/15 1405    Subjective Pt reported she is now taking Topamax for HA vs. vicodin. Pt feels better but her she still does not feel like eating and feels lightheaded. Pt denied falls since last visit.    Pertinent History Seizures, low BP   Patient Stated Goals "Be as close to normal as much as possible and to go back to running" Get back to taking care of my kids, 107 weeks old and 69 and 52 years old   Currently in Pain? No/denies        Therex: Pt reviewed and performed HEP with cues and demonstration for technique. Pt was able to progress from 2 sets to 3 sets while performing most strengthening exercises. Please see pt instructions for details.                         PT Education - 07/26/15 1449    Education provided Yes   Education Details PT  reviewed and progressed HEP as tolerated.   Person(s) Educated Patient   Methods Explanation;Demonstration;Tactile cues;Verbal cues;Handout   Comprehension Returned demonstration;Verbalized understanding;Need further instruction          PT Short Term Goals - 07/26/15 1451    PT SHORT TERM GOAL #1   Title Pt will be IND in HEP to improve strength, balance, and endurance. Target date: 07/27/15.   Status Partially Met   PT SHORT TERM GOAL #2   Title Pt will improve gait speed to >/=1.18ft/sec to decrease falls risk. Target date: 07/27/15.   Status Achieved   PT SHORT TERM GOAL #3   Title Pt will ambulate 300' with LRAD, over even terrain, at MOD I level to improve functional mobility. Target date: 07/27/15.   Status Achieved   PT SHORT TERM GOAL #4   Title Pt will perform TUG in </=13.5 seconds with LRAD to decr. falls risk. Target date: 07/27/15.   Status Partially Met   PT SHORT TERM GOAL #5   Title Perform BERG and write STG and LTG as appropriate. Target date: 07/27/15.   Status Achieved   PT SHORT TERM GOAL #6   Title Pt will improve BERG to >/=51/56 to decr.  falls risk. Target date: 07/27/15.   Status Partially Met           PT Long Term Goals - 07/20/15 1125    PT LONG TERM GOAL #1   Title Pt will ambulate 1000' over even/uneven terrain, with LRAD, at MOD I level to improve functional mobiliity. Target date: 08/24/15.   Status On-going   PT LONG TERM GOAL #2   Title Pt will amb. 300' over even terrain without an AD, IND, to safely amb. in home. Target date: 08/24/15.   Status On-going   PT LONG TERM GOAL #3   Title Pt will improve gait speed to >/=2.96ft/sec, with LRAD, to amb. safely in the community. Target date: 08/24/15.   Status On-going   PT LONG TERM GOAL #4   Title Pt will verbalize plans to join a fitness center upon d/c from PT to continue to maintain strength and endurance gains made during PT. Target date: 08/24/15.   Status On-going   PT LONG TERM GOAL  #5   Title Pt improve BERG score to >/=55/56 to decr. falls risk. Target date: 08/24/15.   Status On-going               Plan - 07/26/15 1449    Clinical Impression Statement Pt demonstrated progress as she was able to progress number of sets for strengthening HEP, indicating improved endurance. However, pt required cues for technique during stretches, thus, she only partially met STG 1. Pt would continue to benefit from skilled PT to improve safety during functional mobiity.   Pt will benefit from skilled therapeutic intervention in order to improve on the following deficits Abnormal gait;Decreased endurance;Impaired sensation;Decreased knowledge of precautions;Decreased activity tolerance;Decreased knowledge of use of DME;Decreased strength;Impaired UE functional use;Impaired tone;Decreased balance;Decreased mobility;Decreased cognition;Decreased range of motion;Decreased safety awareness;Decreased coordination;Impaired flexibility   Rehab Potential Good   Clinical Impairments Affecting Rehab Potential Seizures which may limit intensity of PT   PT Frequency 2x / week   PT Duration 8 weeks   PT Treatment/Interventions ADLs/Self Care Home Management;Neuromuscular re-education;Cognitive remediation;Biofeedback;DME Instruction;Gait training;Stair training;Canalith Repostioning;Patient/family education;Orthotic Fit/Training;Balance training;Therapeutic exercise;Manual techniques;Therapeutic activities;Vestibular   PT Next Visit Plan Finishing assessing glute med HEP exercises and progress as tolerated. Gait training with SPC.   PT Home Exercise Plan Flexibility and strengthening HEP   Consulted and Agree with Plan of Care Patient        Problem List Patient Active Problem List   Diagnosis Date Noted  . Nontraumatic cortical hemorrhage of cerebral hemisphere ([place])   . Seizure ([place])   . Seizures ([place])   . Adjustment disorder with depressed mood   . Contracture of muscle ankle and foot  06/11/2015  . Hemiplga fol ntrm intcrbl hemor aff right dominant side 06/06/2015  . Left-sided intracerebral hemorrhage ([place]) 06/04/2015  . Seizure disorder as sequela of cerebrovascular accident ([place])   . Seizure disorder ([place]) 06/03/2015  . Sepsis ([place]) 06/03/2015  . UTI (urinary tract infection) 06/03/2015  . Hypotension 06/02/2015  . Right spastic hemiparesis ([place]) 05/28/2015  . 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 History of anxiety disorder 05/28/2015  . ICH (intracerebral hemorrhage) ([place])   . Seizure disorder, nonconvulsive, with status epilepticus ([place])   . Cerebral venous thrombosis of cortical vein   . Cytotoxic cerebral edema ([place])   . IVH (intraventricular hemorrhage) ([place])   . Term pregnancy 05/07/2015  . Spontaneous vaginal delivery 05/07/2015    Sherre Wooton L 07/26/2015, 2:52 PM  [facility] [address] [place], [place], [address] Phone: [phone]   Fax:  [phone]     Geoffry Paradise, PT,DPT 07/26/2015 2:52 PM Phone: [phone] Fax: [phone]

## 2015-07-31 ENCOUNTER — Ambulatory Visit: Payer: BLUE CROSS/BLUE SHIELD

## 2015-07-31 ENCOUNTER — Ambulatory Visit: Payer: BLUE CROSS/BLUE SHIELD | Admitting: Occupational Therapy

## 2015-07-31 ENCOUNTER — Ambulatory Visit: Payer: BLUE CROSS/BLUE SHIELD | Admitting: Physical Therapy

## 2015-07-31 DIAGNOSIS — R41841 Cognitive communication deficit: Secondary | ICD-10-CM

## 2015-07-31 DIAGNOSIS — R278 Other lack of coordination: Secondary | ICD-10-CM

## 2015-07-31 DIAGNOSIS — R279 Unspecified lack of coordination: Principal | ICD-10-CM

## 2015-07-31 DIAGNOSIS — R269 Unspecified abnormalities of gait and mobility: Secondary | ICD-10-CM | POA: Diagnosis not present

## 2015-07-31 DIAGNOSIS — IMO0002 Reserved for concepts with insufficient information to code with codable children: Secondary | ICD-10-CM

## 2015-07-31 DIAGNOSIS — R29898 Other symptoms and signs involving the musculoskeletal system: Secondary | ICD-10-CM

## 2015-07-31 NOTE — Patient Instructions (Signed)
HIP: Abduction / External Rotation (Band)    Place yellow band around knees. Lie on left side with hips and knees bent. Raise top knee up, squeezing glutes. Keep feet together. Hold 3 seconds. Do 10 reps per set for 3 sets 1 time a day.  If yellow band seems too hard, remove band and perform same without the band.   Copyright  VHI. All rights reserved.   Hip Abduction / Adduction: with Knee Flexion (Supine)    Bend both knees and place red band around both knees.  Gently lower right knee to side and return. Repeat 10 times per set. Do 3 sets per session. Do 1 sessions per day.  http://orth.exer.us/683   Copyright  VHI. All rights reserved.   Bridge Pose, One Leg    Bent right knee and straighten left knee.  Raise hips by pushing with right leg only.  Repeat 10 times for 3 sets once a day.  Copyright  VHI. All rights reserved.

## 2015-07-31 NOTE — Therapy (Signed)
[facility] [address] [place], [place], [address] Phone: [phone]   Fax:  [phone]  Occupational Therapy Treatment  Patient Details  Name: Ashley [name] MRN: [id number] Date of Birth: 1975/10/19 No Data Recorded  Encounter Date: 07/31/2015      OT End of Session - 07/31/15 0949    Visit Number 10   Date for OT Re-Evaluation 08/29/15   Authorization Type BCBS   Authorization Time Period 65 visit OT   Authorization - Visit Number 10   Authorization - Number of Visits 30   OT Start Time 0940   OT Stop Time 1015   OT Time Calculation (min) 35 min   Activity Tolerance Patient tolerated treatment well   Behavior During Therapy [facility] for tasks assessed/performed      Past Medical History  Diagnosis Date  . Anxiety   . ICH (intracerebral hemorrhage) [facility])     Past Surgical History  Procedure Laterality Date  . Dilation and curettage of uterus      There were no vitals filed for this visit.  Visit Diagnosis:  Decreased coordination  Weakness due to cerebrovascular accident      Subjective Assessment - 07/31/15 0949    Subjective  reports no headache today   Pertinent History see epic snapshot; pt with SAH 2 weeks after deliverying her 3rd child   Patient Stated Goals to be able to use my R arm normally   Currently in Pain? No/denies         Treatment: supine shoulder flexion bilaterally with PVC pipe frame, min facilitation for RUE scapula, scapular stability exercises for shoulder extension bilaterally, min v.c. Closed chain shoulder flexion and chest press in supine with 4.4 lbs ball x 10 reps each, min v.c. Seated mid-high range shoulder flexion with PVC pipe frame, min-mod facilitation for RUE and scapula                       OT Short Term Goals - 07/26/15 1333    OT SHORT TERM GOAL #1   Title I with HEP.   Status Achieved   OT SHORT TERM GOAL #2   Title Pt will  demonstrate impoved fine motor coordinationas evidenced by decreasing RUE 9 hole peg test score to 35 secs or less.   Baseline 28.44 secs   Status Achieved   OT SHORT TERM GOAL #3   Title Pt will perform all basic ADLs at a supervision level.   Status Achieved   OT SHORT TERM GOAL #4   Title Pt will use RUE as an active assist for ADLS/IADLS at least 50 % of the time.   Status Achieved   OT SHORT TERM GOAL #5   Title Pt will perform basic home management/ cooking at min A level.   Status Achieved           OT Long Term Goals - 07/12/15 1149    OT LONG TERM GOAL #1   Title Pt will perfom all basic ADLS modidied independently.   Baseline due 08/27/15   Time 8   Period Weeks   Status On-going   OT LONG TERM GOAL #2   Title Pt will perform basic home management/ cooking at a supervision level demonstrating good safety awareness.   Time 8   Period Weeks   Status On-going   OT LONG TERM GOAL #3   Title Pt will demonstrate ability to retrieve a 3 lbs weight at 120  x 5 reps without dropping with RUE   Time 8   Period Weeks   Status On-going   OT LONG TERM GOAL #4   Title Pt will resume use of RUE as dominant hand at least 75% of the time for ADLs/ IADLs.   Time 8   Period Weeks   Status On-going   OT LONG TERM GOAL #5   Title Pt will demonstrate ability to perform a physical and cognitive task simultaneously with 80% or better accuracy.   Time 8   Period Weeks   Status On-going   OT LONG TERM GOAL #6   Title Pt will demonstrate improved fine motor coordination as evidenced by decreasing RUE 9 hole peg test score to 30 secs or less.   Time 8   Period Weeks   Status On-going               Plan - 07/31/15 1738    Clinical Impression Statement Pt is progressing towards goals for RUE strength and functional use.   Pt will benefit from skilled therapeutic intervention in order to improve on the following deficits (Retired) Abnormal gait;Decreased coordination;Decreased  range of motion;Difficulty walking;Impaired flexibility;Decreased endurance;Decreased safety awareness;Increased edema;Impaired tone;Decreased knowledge of precautions;Decreased activity tolerance;Decreased balance;Decreased knowledge of use of DME;Pain;Impaired UE functional use;Impaired vision/preception;Decreased cognition;Decreased mobility;Decreased strength   Rehab Potential Good   OT Frequency 2x / week   OT Duration 8 weeks   OT Treatment/Interventions Self-care/ADL training;Therapeutic exercise;Patient/family education;Balance training;Ultrasound;Neuromuscular education;Manual Therapy;Splinting;Therapeutic exercises;Energy conservation;Parrafin;DME and/or AE instruction;Therapeutic activities;Cognitive remediation/compensation;Gait Training;Fluidtherapy;Electrical Stimulation;Moist Heat;Contrast Bath;Passive range of motion;Visual/perceptual remediation/compensation   Plan neuro re-ed, RUE functional use   OT Home Exercise Plan issued: ball ex in supine, trunk for A-p tilts with trunk rotation(07/05/15)   Consulted and Agree with Plan of Care Patient        Problem List Patient Active Problem List   Diagnosis Date Noted  . Nontraumatic cortical hemorrhage of cerebral hemisphere ([place])   . Seizure ([place])   . Seizures ([place])   . Adjustment disorder with depressed mood   . Contracture of muscle ankle and foot 06/11/2015  . Hemiplga fol ntrm intcrbl hemor aff right dominant side 06/06/2015  . Left-sided intracerebral hemorrhage ([place]) 06/04/2015  . Seizure disorder as sequela of cerebrovascular accident ([place])   . Seizure disorder ([place]) 06/03/2015  . Sepsis ([place]) 06/03/2015  . UTI (urinary tract infection) 06/03/2015  . Hypotension 06/02/2015  . Right spastic hemiparesis ([place]) 05/28/2015  . 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 History of anxiety disorder 05/28/2015  . ICH (intracerebral hemorrhage) ([place])   . Seizure disorder, nonconvulsive, with status  epilepticus ([place])   . Cerebral venous thrombosis of cortical vein   . Cytotoxic cerebral edema ([place])   . IVH (intraventricular hemorrhage) ([place])   . Term pregnancy 05/07/2015  . Spontaneous vaginal delivery 05/07/2015    Kimaya Whitlatch 07/31/2015, 5:39 PM Theone Murdoch, OTR/L [phone] Phone: [phone] 5:39 PM 07/31/2015 [facility] [address] [place], [place], [address] Phone: [phone]   Fax:  [phone]  Name: Ashley [name] MRN: [id number] Date of Birth: 03/18/1976

## 2015-07-31 NOTE — Patient Instructions (Signed)
  Please complete the assigned speech therapy homework and return it to your next session.  

## 2015-07-31 NOTE — Therapy (Signed)
[facility] [address] [place], [place], [address] Phone: [phone]   Fax:  [phone]  Physical Therapy Treatment  Patient Details  Name: Ashley [name] MRN: [id number] Date of Birth: 1975-11-08 No Data Recorded  Encounter Date: 07/31/2015      PT End of Session - 07/31/15 1307    Visit Number 10   Number of Visits 17   Date for PT Re-Evaluation 08/28/15   Authorization Type BCBS. Per Conan Bowens, pt gets 30 visits for PT and 30 for OT.   Authorization - Visit Number 10   Authorization - Number of Visits 30   PT Start Time 1024  started late secondary to pt needing to use bathroom due to starting period   PT Stop Time 1100   PT Time Calculation (min) 36 min   Activity Tolerance Patient tolerated treatment well   Behavior During Therapy [facility] for tasks assessed/performed      Past Medical History  Diagnosis Date  . Anxiety   . ICH (intracerebral hemorrhage) [facility])     Past Surgical History  Procedure Laterality Date  . Dilation and curettage of uterus      There were no vitals filed for this visit.  Visit Diagnosis:  Right leg weakness      Subjective Assessment - 07/31/15 1304    Subjective Pt denies falls or changes since last visit.   Pertinent History Seizures, low BP   Patient Stated Goals "Be as close to normal as much as possible and to go back to running" Get back to taking care of my kids, 41 weeks old and 58 and 65 years old   Currently in Pain? No/denies         Pt consisted of performing HEP as provided in instructions.  See instructions for details.  Also attempted sidelying with R LE hip abduction with straight leg but pt unable to perform.  Attempted prone hip extension but pt unable to perform as well.           PT Education - 07/31/15 1305    Education provided Yes   Education Details Updated HEP   Person(s) Educated Patient   Methods  Explanation;Demonstration;Handout;Verbal cues;Tactile cues   Comprehension Verbalized understanding;Returned demonstration          PT Short Term Goals - 07/26/15 1451    PT SHORT TERM GOAL #1   Title Pt will be IND in HEP to improve strength, balance, and endurance. Target date: 07/27/15.   Status Partially Met   PT SHORT TERM GOAL #2   Title Pt will improve gait speed to >/=1.77ft/sec to decrease falls risk. Target date: 07/27/15.   Status Achieved   PT SHORT TERM GOAL #3   Title Pt will ambulate 300' with LRAD, over even terrain, at MOD I level to improve functional mobility. Target date: 07/27/15.   Status Achieved   PT SHORT TERM GOAL #4   Title Pt will perform TUG in </=13.5 seconds with LRAD to decr. falls risk. Target date: 07/27/15.   Status Partially Met   PT SHORT TERM GOAL #5   Title Perform BERG and write STG and LTG as appropriate. Target date: 07/27/15.   Status Achieved   PT SHORT TERM GOAL #6   Title Pt will improve BERG to >/=51/56 to decr. falls risk. Target date: 07/27/15.   Status Partially Met           PT Long Term Goals - 07/20/15 1125  PT LONG TERM GOAL #1   Title Pt will ambulate 1000' over even/uneven terrain, with LRAD, at MOD I level to improve functional mobiliity. Target date: 08/24/15.   Status On-going   PT LONG TERM GOAL #2   Title Pt will amb. 300' over even terrain without an AD, IND, to safely amb. in home. Target date: 08/24/15.   Status On-going   PT LONG TERM GOAL #3   Title Pt will improve gait speed to >/=2.94ft/sec, with LRAD, to amb. safely in the community. Target date: 08/24/15.   Status On-going   PT LONG TERM GOAL #4   Title Pt will verbalize plans to join a fitness center upon d/c from PT to continue to maintain strength and endurance gains made during PT. Target date: 08/24/15.   Status On-going   PT LONG TERM GOAL #5   Title Pt improve BERG score to >/=55/56 to decr. falls risk. Target date: 08/24/15.   Status On-going                Plan - 07/31/15 1311    Clinical Impression Statement Pt continues with RLE weakness impacting gait.  Continue PT per POC.   Pt will benefit from skilled therapeutic intervention in order to improve on the following deficits Abnormal gait;Decreased endurance;Impaired sensation;Decreased knowledge of precautions;Decreased activity tolerance;Decreased knowledge of use of DME;Decreased strength;Impaired UE functional use;Impaired tone;Decreased balance;Decreased mobility;Decreased cognition;Decreased range of motion;Decreased safety awareness;Decreased coordination;Impaired flexibility   Rehab Potential Good   Clinical Impairments Affecting Rehab Potential Seizures which may limit intensity of PT   PT Frequency 2x / week   PT Duration 8 weeks   PT Treatment/Interventions ADLs/Self Care Home Management;Neuromuscular re-education;Cognitive remediation;Biofeedback;DME Instruction;Gait training;Stair training;Canalith Repostioning;Patient/family education;Orthotic Fit/Training;Balance training;Therapeutic exercise;Manual techniques;Therapeutic activities;Vestibular   PT Next Visit Plan Gait training with SPC.  R LE strengthening   PT Home Exercise Plan Flexibility and strengthening HEP   Consulted and Agree with Plan of Care Patient        Problem List Patient Active Problem List   Diagnosis Date Noted  . Nontraumatic cortical hemorrhage of cerebral hemisphere ([place])   . Seizure ([place])   . Seizures ([place])   . Adjustment disorder with depressed mood   . Contracture of muscle ankle and foot 06/11/2015  . Hemiplga fol ntrm intcrbl hemor aff right dominant side 06/06/2015  . Left-sided intracerebral hemorrhage ([place]) 06/04/2015  . Seizure disorder as sequela of cerebrovascular accident ([place])   . Seizure disorder ([place]) 06/03/2015  . Sepsis ([place]) 06/03/2015  . UTI (urinary tract infection) 06/03/2015  . Hypotension 06/02/2015  . Right spastic hemiparesis ([place]) 05/28/2015  .  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 History of anxiety disorder 05/28/2015  . ICH (intracerebral hemorrhage) ([place])   . Seizure disorder, nonconvulsive, with status epilepticus ([place])   . Cerebral venous thrombosis of cortical vein   . Cytotoxic cerebral edema ([place])   . IVH (intraventricular hemorrhage) ([place])   . Term pregnancy 05/07/2015  . Spontaneous vaginal delivery 05/07/2015    Narda Bonds 07/31/2015, 1:13 PM  [facility] [address] [place], [place], [address] Phone: [phone]   Fax:  [phone]  Name: Ashley [name] MRN: [id number] Date of Birth: [birth year]    Narda Bonds, [place] 07/31/2015 1:13 PM Phone: [phone] Fax: [phone]

## 2015-07-31 NOTE — Therapy (Signed)
[facility] [address] [place], [place], [address] Phone: [phone]   Fax:  [phone]  Speech Language Pathology Treatment  Patient Details  Name: Ashley [name] MRN: [id number] Date of Birth: [birth year] No Data Recorded  Encounter Date: 07/31/2015      End of Session - 07/31/15 1223    Visit Number 10   Number of Visits 24   Date for SLP Re-Evaluation 09/12/15   Authorization - Visit Number 9   Authorization - Number of Visits 30   SLP Start Time 6967   SLP Stop Time  1146   SLP Time Calculation (min) 41 min   Activity Tolerance Patient tolerated treatment well      Past Medical History  Diagnosis Date  . Anxiety   . ICH (intracerebral hemorrhage) [facility])     Past Surgical History  Procedure Laterality Date  . Dilation and curettage of uterus      There were no vitals filed for this visit.  Visit Diagnosis: Cognitive communication deficit      Subjective Assessment - 07/31/15 1112    Subjective Pt has not fallen in the last week.               ADULT SLP TREATMENT - 07/31/15 1114    General Information   Behavior/Cognition Alert;Cooperative;Pleasant mood   Treatment Provided   Treatment provided Cognitive-Linquistic   Pain Assessment   Pain Assessment No/denies pain   Cognitive-Linquistic Treatment   Treatment focused on Cognition   Skilled Treatment Simple written instructions completed with auditory task to work with divided attention. Pt with approx 20% success with tracking commercials with divided attention, and 40% success with other auditory tasks as the additional intermittent task.   Assessment / Recommendations / Plan   Plan Continue with current plan of care   Progression Toward Goals   Progression toward goals Progressing toward goals            SLP Short Term Goals - 07/31/15 1140    SLP SHORT TERM GOAL #1   Title pt will alternate attention between simple  cognitive-linguistic tasks with 85% success and WFL switch time over 3 sessions   Period --  or 12 visits, for all STGs   Status Achieved   SLP SHORT TERM GOAL #2   Title pt will demo verbal safety awareness related to deficits   Status Achieved   SLP SHORT TERM GOAL #3   Title pt will complete mod complex time, money, sequencing, calendar problems 95% with modified independence (self-correction)   Time 1   Period Weeks   Status On-going   SLP SHORT TERM GOAL #4   Title pt will demo divided attention in two simple tasks 80% of the time with rare min A   Time 1   Period Weeks   Status On-going          SLP Long Term Goals - 07/31/15 1141    SLP LONG TERM GOAL #1   Title pt will divide attention between two mod complex cognitive linguistic tasks 90% each task   Time 4   Period Weeks   Status On-going   SLP LONG TERM GOAL #2   Title pt will demo anticipatory awareness by using compensatory measures for memory and/or decr'd attention x1 in a session over 4 sessions   Time 4   Period Weeks   Status On-going   SLP LONG TERM GOAL #3   Title pt will demo executive function  in a simple task with rare min A   Time 4   Period Weeks   Status On-going          Plan - 07/31/15 1141    Clinical Impression Statement Divided attention tasks today without notable incr. in ability compared to last week. Skilled ST needed to cont to improve skills for possible return to work.   Speech Therapy Frequency 2x / week   Duration 4 weeks  or 7 more visits   Treatment/Interventions Cognitive reorganization;SLP instruction and feedback;Compensatory strategies;Internal/external aids;Environmental controls;Patient/family education;Functional tasks;Cueing hierarchy;Language facilitation   Potential to Achieve Goals Good   Potential Considerations Ability to learn/carryover information;Severity of impairments        Problem List Patient Active Problem List   Diagnosis Date Noted  .  Nontraumatic cortical hemorrhage of cerebral hemisphere ([place])   . Seizure ([place])   . Seizures ([place])   . Adjustment disorder with depressed mood   . Contracture of muscle ankle and foot 06/11/2015  . Hemiplga fol ntrm intcrbl hemor aff right dominant side 06/06/2015  . Left-sided intracerebral hemorrhage ([place]) 06/04/2015  . Seizure disorder as sequela of cerebrovascular accident ([place])   . Seizure disorder ([place]) 06/03/2015  . Sepsis ([place]) 06/03/2015  . UTI (urinary tract infection) 06/03/2015  . Hypotension 06/02/2015  . Right spastic hemiparesis ([place]) 05/28/2015  . 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 History of anxiety disorder 05/28/2015  . ICH (intracerebral hemorrhage) ([place])   . Seizure disorder, nonconvulsive, with status epilepticus ([place])   . Cerebral venous thrombosis of cortical vein   . Cytotoxic cerebral edema ([place])   . IVH (intraventricular hemorrhage) ([place])   . Term pregnancy 05/07/2015  . Spontaneous vaginal delivery 05/07/2015    [facility] [place], [place]  07/31/2015, 12:24 PM  [facility] [address] [place], [place], [address] Phone: [phone]   Fax:  [phone]   Name: Marvie Brevik MRN: [id number] Date of Birth: 02/26/1976

## 2015-08-02 ENCOUNTER — Ambulatory Visit: Payer: BLUE CROSS/BLUE SHIELD

## 2015-08-02 ENCOUNTER — Ambulatory Visit: Payer: BLUE CROSS/BLUE SHIELD | Admitting: Occupational Therapy

## 2015-08-02 DIAGNOSIS — IMO0002 Reserved for concepts with insufficient information to code with codable children: Secondary | ICD-10-CM

## 2015-08-02 DIAGNOSIS — R278 Other lack of coordination: Secondary | ICD-10-CM

## 2015-08-02 DIAGNOSIS — R269 Unspecified abnormalities of gait and mobility: Secondary | ICD-10-CM | POA: Diagnosis not present

## 2015-08-02 DIAGNOSIS — R41841 Cognitive communication deficit: Secondary | ICD-10-CM

## 2015-08-02 DIAGNOSIS — I69151 Hemiplegia and hemiparesis following nontraumatic intracerebral hemorrhage affecting right dominant side: Secondary | ICD-10-CM

## 2015-08-02 NOTE — Therapy (Signed)
[facility] [address] [place], [place], [address] Phone: [phone]   Fax:  [phone]  Physical Therapy Treatment  Patient Details  Name: Ashley [name] MRN: [id number] Date of Birth: 08-Dec-1975 No Data Recorded  Encounter Date: 08/02/2015      PT End of Session - 08/02/15 1600    Visit Number 11   Number of Visits 17   Date for PT Re-Evaluation 08/28/15   Authorization Type BCBS. Per Laverda Page, pt gets 30 visits for PT and 30 for OT.   Authorization - Visit Number 11   Authorization - Number of Visits 30   PT Start Time 1401   PT Stop Time 1444   PT Time Calculation (min) 43 min   Equipment Utilized During Treatment Gait belt   Activity Tolerance Patient tolerated treatment well   Behavior During Therapy WFL for tasks assessed/performed      Past Medical History  Diagnosis Date  . Anxiety   . ICH (intracerebral hemorrhage) [facility])     Past Surgical History  Procedure Laterality Date  . Dilation and curettage of uterus      There were no vitals filed for this visit.  Visit Diagnosis:  Abnormality of gait      Subjective Assessment - 08/02/15 1403    Subjective Pt denied falls or changes since last visit. Pt purchased SPC last week.    Pertinent History Seizures, low BP   Patient Stated Goals "Be as close to normal as much as possible and to go back to running" Get back to taking care of my kids, 60 weeks old and 35 and 62 years old   Currently in Pain? No/denies                         [facility] Adult PT Treatment/Exercise - 08/02/15 1431    Ambulation/Gait   Ambulation/Gait Yes   Ambulation/Gait Assistance 5: Supervision;4: Min guard   Ambulation/Gait Assistance Details Pt amb. outdoors over even/uneven terrain with supervision over paved surfaces and min guard over grass. Cues to improve L heel strike, L step length, and to decr. R elbow flexion. Pt noted to experience incr. R foot  inversion during amb. in grass.    Ambulation Distance (Feet) --  500', 300', 100'   Assistive device Straight cane   Gait Pattern Decreased step length - left;Decreased dorsiflexion - right;Decreased hip/knee flexion - right;Decreased stride length;Ataxic;Right genu recurvatum;Step-through pattern   Ambulation Surface Level;Unlevel;Indoor;Outdoor;Paved;Grass   High Level Balance   High Level Balance Activities Negotiating over obstacles  cone taps   High Level Balance Comments On pavement outside with min guard to min A: pt traversed 4x4 short hurdles with SPC and 4x4 small hurdles without SPC. Cues to improve lateral weight shifting and R knee/hip flexion.  Indoors with min guard to min A: pt performed B cone taps: 4x5 cones with cues to improve lateral wt. shifting.                PT Education - 08/02/15 1559    Education provided Yes   Education Details PT reiterated the importance of using SPC during amb. for safety.    Person(s) Educated Patient   Methods Explanation   Comprehension Verbalized understanding          PT Short Term Goals - 07/26/15 1451    PT SHORT TERM GOAL #1   Title Pt will be IND in HEP to improve strength, balance,  and endurance. Target date: 07/27/15.   Status Partially Met   PT SHORT TERM GOAL #2   Title Pt will improve gait speed to >/=1.53ft/sec to decrease falls risk. Target date: 07/27/15.   Status Achieved   PT SHORT TERM GOAL #3   Title Pt will ambulate 300' with LRAD, over even terrain, at MOD I level to improve functional mobility. Target date: 07/27/15.   Status Achieved   PT SHORT TERM GOAL #4   Title Pt will perform TUG in </=13.5 seconds with LRAD to decr. falls risk. Target date: 07/27/15.   Status Partially Met   PT SHORT TERM GOAL #5   Title Perform BERG and write STG and LTG as appropriate. Target date: 07/27/15.   Status Achieved   PT SHORT TERM GOAL #6   Title Pt will improve BERG to >/=51/56 to decr. falls risk. Target  date: 07/27/15.   Status Partially Met           PT Long Term Goals - 07/20/15 1125    PT LONG TERM GOAL #1   Title Pt will ambulate 1000' over even/uneven terrain, with LRAD, at MOD I level to improve functional mobiliity. Target date: 08/24/15.   Status On-going   PT LONG TERM GOAL #2   Title Pt will amb. 300' over even terrain without an AD, IND, to safely amb. in home. Target date: 08/24/15.   Status On-going   PT LONG TERM GOAL #3   Title Pt will improve gait speed to >/=2.22ft/sec, with LRAD, to amb. safely in the community. Target date: 08/24/15.   Status On-going   PT LONG TERM GOAL #4   Title Pt will verbalize plans to join a fitness center upon d/c from PT to continue to maintain strength and endurance gains made during PT. Target date: 08/24/15.   Status On-going   PT LONG TERM GOAL #5   Title Pt improve BERG score to >/=55/56 to decr. falls risk. Target date: 08/24/15.   Status On-going               Plan - 08/02/15 1600    Clinical Impression Statement Pt demonstrated progress, as she was able to amb. over uneven terrain with SPC with min guard. Pt continues to require seated/standing rest breaks during session 2/2 R LE fatigue. Per pt request, PT spoke with OT regarding pt's c/o of R shoulder pain t night and her wish to work on R shoulder ER/IR activities to improve IND.   Pt will benefit from skilled therapeutic intervention in order to improve on the following deficits Abnormal gait;Decreased endurance;Impaired sensation;Decreased knowledge of precautions;Decreased activity tolerance;Decreased knowledge of use of DME;Decreased strength;Impaired UE functional use;Impaired tone;Decreased balance;Decreased mobility;Decreased cognition;Decreased range of motion;Decreased safety awareness;Decreased coordination;Impaired flexibility   Rehab Potential Good   Clinical Impairments Affecting Rehab Potential Seizures which may limit intensity of PT   PT Frequency 2x /  week   PT Duration 8 weeks   PT Treatment/Interventions ADLs/Self Care Home Management;Neuromuscular re-education;Cognitive remediation;Biofeedback;DME Instruction;Gait training;Stair training;Canalith Repostioning;Patient/family education;Orthotic Fit/Training;Balance training;Therapeutic exercise;Manual techniques;Therapeutic activities;Vestibular   PT Next Visit Plan Gait training with SPC.  R LE strengthening. SLS activities   PT Home Exercise Plan Flexibility and strengthening HEP   Consulted and Agree with Plan of Care Patient        Problem List Patient Active Problem List   Diagnosis Date Noted  . Nontraumatic cortical hemorrhage of cerebral hemisphere ([place])   . Seizure ([place])   . Seizures ([place])   .  Adjustment disorder with depressed mood   . Contracture of muscle ankle and foot 06/11/2015  . Hemiplga fol ntrm intcrbl hemor aff right dominant side 06/06/2015  . Left-sided intracerebral hemorrhage ([place]) 06/04/2015  . Seizure disorder as sequela of cerebrovascular accident ([place])   . Seizure disorder ([place]) 06/03/2015  . Sepsis ([place]) 06/03/2015  . UTI (urinary tract infection) 06/03/2015  . Hypotension 06/02/2015  . Right spastic hemiparesis ([place]) 05/28/2015  . 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 History of anxiety disorder 05/28/2015  . ICH (intracerebral hemorrhage) ([place])   . Seizure disorder, nonconvulsive, with status epilepticus ([place])   . Cerebral venous thrombosis of cortical vein   . Cytotoxic cerebral edema ([place])   . IVH (intraventricular hemorrhage) ([place])   . Term pregnancy 05/07/2015  . Spontaneous vaginal delivery 05/07/2015    Marine Lezotte L 08/02/2015, 4:03 PM  [facility] [address] [place], [place], [address] Phone: [phone]   Fax:  [phone]  Name: Ashley [name] MRN: [id number] Date of Birth: [birth year]    Geoffry Paradise, PT,DPT 08/02/2015  4:03 PM Phone: [phone] Fax: [phone]

## 2015-08-02 NOTE — Therapy (Signed)
[facility] [address] [place], [place], [address] Phone: [phone]   Fax:  [phone]  Occupational Therapy Treatment  Patient Details  Name: Ashley [name] MRN: [id number] Date of Birth: March 31, 1976 No Data Recorded  Encounter Date: 08/02/2015      OT End of Session - 08/02/15 2016    Visit Number 11   Number of Visits 17   Date for OT Re-Evaluation 08/29/15   Authorization Type BCBS   Authorization Time Period 83 visit OT   Authorization - Visit Number 11   Authorization - Number of Visits 30   OT Start Time 1448   OT Stop Time 1530   OT Time Calculation (min) 42 min   Activity Tolerance Patient tolerated treatment well   Behavior During Therapy [facility] for tasks assessed/performed      Past Medical History  Diagnosis Date  . Anxiety   . ICH (intracerebral hemorrhage) [facility])     Past Surgical History  Procedure Laterality Date  . Dilation and curettage of uterus      There were no vitals filed for this visit.  Visit Diagnosis:  Weakness due to cerebrovascular accident  Coordination impairment  Hemiplga fol ntrm intcrbl hemor aff right dominant side      Subjective Assessment - 08/02/15 2012    Pertinent History see epic snapshot; pt with SAH 2 weeks after deliverying her 3rd child   Patient Stated Goals to be able to use my R arm normally   Currently in Pain? Yes   Pain Score 3    Pain Location Shoulder   Pain Orientation Right   Pain Descriptors / Indicators Aching   Pain Type Acute pain   Pain Onset Today   Pain Frequency Intermittent   Aggravating Factors  malpositioning   Pain Relieving Factors repositioning   Multiple Pain Sites No          Treatment: Pt reports new increased right shoulder pain at night time and difficulty with hygie when toileting.  Supine shoulder flexion closed chain. Gentle joint mobs followed by AA/ROM shoulder flexion, abduction then scapular  retraction. Seated weightbearing with scapular retraction, bilateral UE external rotation, min facilitation followed by scapular mobs. Pt perfomed bilateral shoulder extension in standing with supervision. Pt was instructed if she performs at home to perform with right arm only with LUE providing support on counter. UE ranger forAA/ROM shoulder flexion mi range with min facilitation. Kinesiotape applied to promote shoulder external rotation, scapular retraction                      OT Short Term Goals - 07/26/15 1333    OT SHORT TERM GOAL #1   Title I with HEP.   Status Achieved   OT SHORT TERM GOAL #2   Title Pt will demonstrate impoved fine motor coordinationas evidenced by decreasing RUE 9 hole peg test score to 35 secs or less.   Baseline 28.44 secs   Status Achieved   OT SHORT TERM GOAL #3   Title Pt will perform all basic ADLs at a supervision level.   Status Achieved   OT SHORT TERM GOAL #4   Title Pt will use RUE as an active assist for ADLS/IADLS at least 50 % of the time.   Status Achieved   OT SHORT TERM GOAL #5   Title Pt will perform basic home management/ cooking at min A level.   Status Achieved  OT Long Term Goals - 07/12/15 1149    OT LONG TERM GOAL #1   Title Pt will perfom all basic ADLS modidied independently.   Baseline due 08/27/15   Time 8   Period Weeks   Status On-going   OT LONG TERM GOAL #2   Title Pt will perform basic home management/ cooking at a supervision level demonstrating good safety awareness.   Time 8   Period Weeks   Status On-going   OT LONG TERM GOAL #3   Title Pt will demonstrate ability to retrieve a 3 lbs weight at 120 x 5 reps without dropping with RUE   Time 8   Period Weeks   Status On-going   OT LONG TERM GOAL #4   Title Pt will resume use of RUE as dominant hand at least 75% of the time for ADLs/ IADLs.   Time 8   Period Weeks   Status On-going   OT LONG TERM GOAL #5   Title Pt will  demonstrate ability to perform a physical and cognitive task simultaneously with 80% or better accuracy.   Time 8   Period Weeks   Status On-going   OT LONG TERM GOAL #6   Title Pt will demonstrate improved fine motor coordination as evidenced by decreasing RUE 9 hole peg test score to 30 secs or less.   Time 8   Period Weeks   Status On-going               Plan - 08/02/15 2014    Clinical Impression Statement Pt is progressing towards goals yet limited by shoulder pain .   Pt will benefit from skilled therapeutic intervention in order to improve on the following deficits (Retired) Abnormal gait;Decreased coordination;Decreased range of motion;Difficulty walking;Impaired flexibility;Decreased endurance;Decreased safety awareness;Increased edema;Impaired tone;Decreased knowledge of precautions;Decreased activity tolerance;Decreased balance;Decreased knowledge of use of DME;Pain;Impaired UE functional use;Impaired vision/preception;Decreased cognition;Decreased mobility;Decreased strength   Rehab Potential Good   OT Frequency 2x / week   OT Duration 8 weeks   OT Treatment/Interventions Self-care/ADL training;Therapeutic exercise;Patient/family education;Balance training;Ultrasound;Neuromuscular education;Manual Therapy;Splinting;Therapeutic exercises;Energy conservation;Parrafin;DME and/or AE instruction;Therapeutic activities;Cognitive remediation/compensation;Gait Training;Fluidtherapy;Electrical Stimulation;Moist Heat;Contrast Bath;Passive range of motion;Visual/perceptual remediation/compensation   Plan neuro re-ed, pain reduction right shoulder   OT Home Exercise Plan issued: ball ex in supine, trunk for A-p tilts with trunk rotation(07/05/15)        Problem List Patient Active Problem List   Diagnosis Date Noted  . Nontraumatic cortical hemorrhage of cerebral hemisphere ([place])   . Seizure ([place])   . Seizures ([place])   . Adjustment disorder with depressed mood   . Contracture of  muscle ankle and foot 06/11/2015  . Hemiplga fol ntrm intcrbl hemor aff right dominant side 06/06/2015  . Left-sided intracerebral hemorrhage ([place]) 06/04/2015  . Seizure disorder as sequela of cerebrovascular accident ([place])   . Seizure disorder ([place]) 06/03/2015  . Sepsis ([place]) 06/03/2015  . UTI (urinary tract infection) 06/03/2015  . Hypotension 06/02/2015  . Right spastic hemiparesis ([place]) 05/28/2015  . 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 History of anxiety disorder 05/28/2015  . ICH (intracerebral hemorrhage) ([place])   . Seizure disorder, nonconvulsive, with status epilepticus ([place])   . Cerebral venous thrombosis of cortical vein   . Cytotoxic cerebral edema ([place])   . IVH (intraventricular hemorrhage) ([place])   . Term pregnancy 05/07/2015  . Spontaneous vaginal delivery 05/07/2015    RINE,KATHRYN 08/02/2015, 8:19 PM Theone Murdoch, OTR/L [phone] Phone: [phone] 8:20 PM 08/02/2015 [facility] [address] [place], [place], [address] Phone: [phone]   Fax:  [phone]  Name: Brae Schaafsma MRN: [id number] Date of Birth: 10/08/1976

## 2015-08-02 NOTE — Therapy (Signed)
[facility] [address] [place], [place], [address] Phone: [phone]   Fax:  [phone]  Speech Language Pathology Treatment  Patient Details  Name: Ashley [name] MRN: [id number] Date of Birth: November 21, 1975 No Data Recorded  Encounter Date: 08/02/2015      End of Session - 08/02/15 1406    Visit Number 11   Number of Visits 24   Date for SLP Re-Evaluation 09/12/15   Authorization - Visit Number 10   Authorization - Number of Visits 30   SLP Start Time 2992   SLP Stop Time  1400   SLP Time Calculation (min) 43 min   Activity Tolerance Patient tolerated treatment well      Past Medical History  Diagnosis Date  . Anxiety   . ICH (intracerebral hemorrhage) [facility])     Past Surgical History  Procedure Laterality Date  . Dilation and curettage of uterus      There were no vitals filed for this visit.  Visit Diagnosis: Cognitive communication deficit      Subjective Assessment - 08/02/15 1328    Subjective Pt arrived with cane. "This cane slows me down.' SLP suggested pt talk with PT about her concern               ADULT SLP TREATMENT - 08/02/15 1329    General Information   Behavior/Cognition Alert;Cooperative;Pleasant mood   Treatment Provided   Treatment provided Cognitive-Linquistic   Pain Assessment   Pain Assessment No/denies pain   Cognitive-Linquistic Treatment   Treatment focused on Cognition   Skilled Treatment Divided attention tasks (simple written with simple/mod complex auditory) with <10% success with auditory and average 90% success with written. SLP attempted to decr complexity of auditory task to simple, but success did not change. Pt and SLP talked about compensations for divided attention (separate the two tasks).    Assessment / Recommendations / Plan   Plan Continue with current plan of care   Progression Toward Goals   Progression toward goals Progressing toward goals          SLP Education - 08/02/15 1406    Education provided Yes   Education Details divided attention compensations   Person(s) Educated Patient   Methods Explanation   Comprehension Verbalized understanding          SLP Short Term Goals - 08/02/15 1408    SLP SHORT TERM GOAL #1   Title pt will alternate attention between simple cognitive-linguistic tasks with 85% success and WFL switch time over 3 sessions   Period --  or 12 visits, for all STGs   Status Achieved   SLP SHORT TERM GOAL #2   Title pt will demo verbal safety awareness related to deficits   Status Achieved   SLP SHORT TERM GOAL #3   Title pt will complete mod complex time, money, sequencing, calendar problems 95% with modified independence (self-correction)   Status Achieved   SLP SHORT TERM GOAL #4   Title pt will demo divided attention in two simple tasks 80% of the time with rare min A   Time 1   Period Weeks   Status On-going          SLP Long Term Goals - 08/02/15 1409    SLP LONG TERM GOAL #1   Title pt will divide attention between one simple task and one mod complex cognitive linguistic task 90% each task   Time 4   Period Weeks   Status Revised  SLP LONG TERM GOAL #2   Title pt will demo anticipatory awareness by using compensatory measures for memory and/or decr'd attention x1 in a session over 4 sessions   Time 4   Period Weeks   Status On-going   SLP LONG TERM GOAL #3   Title pt will demo executive function in a simple task with rare min A   Time 4   Period Weeks   Status On-going          Plan - 08/02/15 1408    Clinical Impression Statement Divided attention tasks today without notable incr. in ability compared to last week. Skilled ST needed to cont to improve skills for possible return to work.   Speech Therapy Frequency 2x / week   Duration 4 weeks   Treatment/Interventions Cognitive reorganization;SLP instruction and feedback;Compensatory strategies;Internal/external  aids;Environmental controls;Patient/family education;Functional tasks;Cueing hierarchy;Language facilitation   Potential to Achieve Goals Good   Potential Considerations Ability to learn/carryover information;Severity of impairments        Problem List Patient Active Problem List   Diagnosis Date Noted  . Nontraumatic cortical hemorrhage of cerebral hemisphere ([place])   . Seizure ([place])   . Seizures ([place])   . Adjustment disorder with depressed mood   . Contracture of muscle ankle and foot 06/11/2015  . Hemiplga fol ntrm intcrbl hemor aff right dominant side 06/06/2015  . Left-sided intracerebral hemorrhage ([place]) 06/04/2015  . Seizure disorder as sequela of cerebrovascular accident ([place])   . Seizure disorder ([place]) 06/03/2015  . Sepsis ([place]) 06/03/2015  . UTI (urinary tract infection) 06/03/2015  . Hypotension 06/02/2015  . Right spastic hemiparesis ([place]) 05/28/2015  . 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 History of anxiety disorder 05/28/2015  . ICH (intracerebral hemorrhage) ([place])   . Seizure disorder, nonconvulsive, with status epilepticus ([place])   . Cerebral venous thrombosis of cortical vein   . Cytotoxic cerebral edema ([place])   . IVH (intraventricular hemorrhage) ([place])   . Term pregnancy 05/07/2015  . Spontaneous vaginal delivery 05/07/2015    [facility] , [place], [place]  08/02/2015, 2:12 PM  [facility] [address] [place], [place], [address] Phone: [phone]   Fax:  [phone]   Name: Ashley [name] MRN: [id number] Date of Birth: [birth year]

## 2015-08-07 ENCOUNTER — Ambulatory Visit: Payer: BLUE CROSS/BLUE SHIELD | Admitting: Physical Therapy

## 2015-08-07 ENCOUNTER — Ambulatory Visit: Payer: BLUE CROSS/BLUE SHIELD | Admitting: Occupational Therapy

## 2015-08-07 ENCOUNTER — Ambulatory Visit: Payer: BLUE CROSS/BLUE SHIELD | Admitting: Speech Pathology

## 2015-08-07 DIAGNOSIS — R269 Unspecified abnormalities of gait and mobility: Secondary | ICD-10-CM

## 2015-08-07 DIAGNOSIS — R278 Other lack of coordination: Secondary | ICD-10-CM

## 2015-08-07 DIAGNOSIS — IMO0002 Reserved for concepts with insufficient information to code with codable children: Secondary | ICD-10-CM

## 2015-08-07 DIAGNOSIS — G8111 Spastic hemiplegia affecting right dominant side: Secondary | ICD-10-CM

## 2015-08-07 DIAGNOSIS — R279 Unspecified lack of coordination: Secondary | ICD-10-CM

## 2015-08-07 DIAGNOSIS — I69151 Hemiplegia and hemiparesis following nontraumatic intracerebral hemorrhage affecting right dominant side: Secondary | ICD-10-CM

## 2015-08-07 DIAGNOSIS — R41841 Cognitive communication deficit: Secondary | ICD-10-CM

## 2015-08-07 DIAGNOSIS — R29898 Other symptoms and signs involving the musculoskeletal system: Secondary | ICD-10-CM

## 2015-08-07 NOTE — Therapy (Signed)
[facility] [address] [place], [place], [address] Phone: [phone]   Fax:  [phone]  Occupational Therapy Treatment  Patient Details  Name: Ashley [name] MRN: [id number] Date of Birth: 1976/04/01 No Data Recorded  Encounter Date: 08/07/2015      OT End of Session - 08/07/15 1428    Visit Number 12   Number of Visits 17   Date for OT Re-Evaluation 08/29/15   Authorization Type BCBS   Authorization Time Period 69 visit OT   Authorization - Visit Number 12   Authorization - Number of Visits 30   OT Start Time 0848   OT Stop Time 0930   OT Time Calculation (min) 42 min   Activity Tolerance Patient tolerated treatment well   Behavior During Therapy [facility] for tasks assessed/performed      Past Medical History  Diagnosis Date  . Anxiety   . ICH (intracerebral hemorrhage) [facility])     Past Surgical History  Procedure Laterality Date  . Dilation and curettage of uterus      There were no vitals filed for this visit.  Visit Diagnosis:  Weakness due to cerebrovascular accident  Coordination impairment  Hemiplga fol ntrm intcrbl hemor aff right dominant side  Decreased coordination      Subjective Assessment - 08/07/15 1424    Pertinent History see epic snapshot; pt with SAH 2 weeks after deliverying her 3rd child   Patient Stated Goals to be able to use my R arm normally   Currently in Pain? Yes   Pain Score 3    Pain Location Shoulder   Pain Orientation Right   Pain Descriptors / Indicators Aching   Pain Type Acute pain   Pain Onset Today   Pain Frequency Intermittent   Aggravating Factors  malpositioning   Pain Relieving Factors repositioning   Multiple Pain Sites No      Treatment: Pt reports continued shoulder pain today. Supine closed chain shoulder flexion with therapistfacilitating scapula and shoulder position. While in supine, education provided regarding bed positioning to minimize  pain, pt returned demonstration of correct positioning, and reports decreased pain. In supine shoulder abduction with gentle joint mobs and AA/ROM shoulder abduction, followed by scapular retraction with bilateral UE's in abduction, towel under right elbow. Seated edge of mat bilateral scapular retraction with arms in external rotation. Education provided to pt. regarding positioning(avoiding internal rotation and shoulder hiking, to minimize pain.) Ultrasound 1 mhz, 0.8w/cm2, 50 %, x 69mins to right upper lateral arm, mild pink coloration at upper arm, pt reported decreased pain after ultrasound. Closed chain shoulder flexion in seated, mid range, AA/ROM with no reports of pain                          OT Short Term Goals - 07/26/15 1333    OT SHORT TERM GOAL #1   Title I with HEP.   Status Achieved   OT SHORT TERM GOAL #2   Title Pt will demonstrate impoved fine motor coordinationas evidenced by decreasing RUE 9 hole peg test score to 35 secs or less.   Baseline 28.44 secs   Status Achieved   OT SHORT TERM GOAL #3   Title Pt will perform all basic ADLs at a supervision level.   Status Achieved   OT SHORT TERM GOAL #4   Title Pt will use RUE as an active assist for ADLS/IADLS at least 50 % of the time.  Status Achieved   OT SHORT TERM GOAL #5   Title Pt will perform basic home management/ cooking at min A level.   Status Achieved           OT Long Term Goals - 07/12/15 1149    OT LONG TERM GOAL #1   Title Pt will perfom all basic ADLS modidied independently.   Baseline due 08/27/15   Time 8   Period Weeks   Status On-going   OT LONG TERM GOAL #2   Title Pt will perform basic home management/ cooking at a supervision level demonstrating good safety awareness.   Time 8   Period Weeks   Status On-going   OT LONG TERM GOAL #3   Title Pt will demonstrate ability to retrieve a 3 lbs weight at 120 x 5 reps without dropping with RUE   Time 8   Period Weeks    Status On-going   OT LONG TERM GOAL #4   Title Pt will resume use of RUE as dominant hand at least 75% of the time for ADLs/ IADLs.   Time 8   Period Weeks   Status On-going   OT LONG TERM GOAL #5   Title Pt will demonstrate ability to perform a physical and cognitive task simultaneously with 80% or better accuracy.   Time 8   Period Weeks   Status On-going   OT LONG TERM GOAL #6   Title Pt will demonstrate improved fine motor coordination as evidenced by decreasing RUE 9 hole peg test score to 30 secs or less.   Time 8   Period Weeks   Status On-going               Plan - 08/07/15 1426    Clinical Impression Statement Pt is progressing yet limited by new increased right shoulder pain. Pt sees Dr. Letta Pate next week.   Pt will benefit from skilled therapeutic intervention in order to improve on the following deficits (Retired) Abnormal gait;Decreased coordination;Decreased range of motion;Difficulty walking;Impaired flexibility;Decreased endurance;Decreased safety awareness;Increased edema;Impaired tone;Decreased knowledge of precautions;Decreased activity tolerance;Decreased balance;Decreased knowledge of use of DME;Pain;Impaired UE functional use;Impaired vision/preception;Decreased cognition;Decreased mobility;Decreased strength   Rehab Potential Good   OT Frequency 2x / week   OT Duration 8 weeks   OT Treatment/Interventions Self-care/ADL training;Therapeutic exercise;Patient/family education;Balance training;Ultrasound;Neuromuscular education;Manual Therapy;Splinting;Therapeutic exercises;Energy conservation;Parrafin;DME and/or AE instruction;Therapeutic activities;Cognitive remediation/compensation;Gait Training;Fluidtherapy;Electrical Stimulation;Moist Heat;Contrast Bath;Passive range of motion;Visual/perceptual remediation/compensation   Plan neuro re-ed, modalities   OT Home Exercise Plan issued: ball ex in supine, trunk for A-p tilts with trunk rotation(07/05/15)    Consulted and Agree with Plan of Care Patient        Problem List Patient Active Problem List   Diagnosis Date Noted  . Nontraumatic cortical hemorrhage of cerebral hemisphere ([place])   . Seizure ([place])   . Seizures ([place])   . Adjustment disorder with depressed mood   . Contracture of muscle ankle and foot 06/11/2015  . Hemiplga fol ntrm intcrbl hemor aff right dominant side 06/06/2015  . Left-sided intracerebral hemorrhage ([place]) 06/04/2015  . Seizure disorder as sequela of cerebrovascular accident ([place])   . Seizure disorder ([place]) 06/03/2015  . Sepsis ([place]) 06/03/2015  . UTI (urinary tract infection) 06/03/2015  . Hypotension 06/02/2015  . Right spastic hemiparesis ([place]) 05/28/2015  . 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 History of anxiety disorder 05/28/2015  . ICH (intracerebral hemorrhage) ([place])   . Seizure disorder, nonconvulsive, with status epilepticus ([place])   . Cerebral  venous thrombosis of cortical vein   . Cytotoxic cerebral edema ([place])   . IVH (intraventricular hemorrhage) ([place])   . Term pregnancy 05/07/2015  . Spontaneous vaginal delivery 05/07/2015    RINE,KATHRYN 08/07/2015, 2:29 PM  [facility] [address] [place], [place], [address] Phone: [phone]   Fax:  [phone]  Name: Jonetta Dagley MRN: [id number] Date of Birth: Dec 04, 1975

## 2015-08-07 NOTE — Therapy (Signed)
[facility] [address] [place], [place], [address] Phone: [phone]   Fax:  [phone]  Speech Language Pathology Treatment  Patient Details  Name: Ashley [name] MRN: [id number] Date of Birth: May 19, 1976 No Data Recorded  Encounter Date: 08/07/2015      End of Session - 08/07/15 1010    Visit Number 12   Number of Visits 24   Date for SLP Re-Evaluation 09/12/15   Authorization - Visit Number 10   Authorization - Number of Visits 58   SLP Start Time 7616      Past Medical History  Diagnosis Date  . Anxiety   . ICH (intracerebral hemorrhage) [facility])     Past Surgical History  Procedure Laterality Date  . Dilation and curettage of uterus      There were no vitals filed for this visit.  Visit Diagnosis: Cognitive communication deficit      Subjective Assessment - 08/07/15 0935    Subjective "It took by about 20 minutes to do both crossword puzzles"               ADULT SLP TREATMENT - 08/07/15 0935    General Information   Behavior/Cognition Alert;Cooperative;Pleasant mood   Treatment Provided   Treatment provided Cognitive-Linquistic   Pain Assessment   Pain Assessment 0-10   Pain Score 6    Pain Location right shoulder   Pain Descriptors / Indicators Aching   Pain Intervention(s) Monitored during session;Repositioned   Cognitive-Linquistic Treatment   Treatment focused on Cognition   Skilled Treatment Faciliated divided attention with sorting cards according to 3 rules, and verbalizing bills  and coins that add up to a given amount  with  occasional min cues for card sort and usual request for repetition for coin amount - pt primarily used alternating attention stopping card sort when listening to and verbalizing money amounts  with 90% on card sort and 75% on auditory. Divided attention between written task (generating smaller words from long word) and auditory/verbal alphabetizing  letters in 4-6 letter words - written tasks 95% correct  and timely, auditory 80% correct with extended time, cues for error awareness and occasional request for repeat of word.   Assessment / Recommendations / Plan   Plan Continue with current plan of care   Progression Toward Goals   Progression toward goals Progressing toward goals          SLP Education - 08/07/15 1007    Education provided Yes   Education Details Compensations for attention   Person(s) Educated Patient   Methods Explanation   Comprehension Verbalized understanding          SLP Short Term Goals - 08/07/15 1010    SLP SHORT TERM GOAL #1   Title pt will alternate attention between simple cognitive-linguistic tasks with 85% success and WFL switch time over 3 sessions   Period --  or 12 visits, for all STGs   Status Achieved   SLP SHORT TERM GOAL #2   Title pt will demo verbal safety awareness related to deficits   Status Achieved   SLP SHORT TERM GOAL #3   Title pt will complete mod complex time, money, sequencing, calendar problems 95% with modified independence (self-correction)   Status Achieved   SLP SHORT TERM GOAL #4   Title pt will demo divided attention in two simple tasks 80% of the time with rare min A   Time 1   Period Weeks   Status On-going  SLP Long Term Goals - 08/07/15 1010    SLP LONG TERM GOAL #1   Title pt will divide attention between one simple task and one mod complex cognitive linguistic task 90% each task   Time 3   Period Weeks   Status Revised   SLP LONG TERM GOAL #2   Title pt will demo anticipatory awareness by using compensatory measures for memory and/or decr'd attention x1 in a session over 4 sessions   Time 3   Period Weeks   Status On-going   SLP LONG TERM GOAL #3   Title pt will demo executive function in a simple task with rare min A   Time 3   Period Weeks   Status On-going          Plan - 08/07/15 1008    Clinical Impression Statement  Divided attention tasks with 70% to 90% with min A - continue skilled ST to maximize cognition for independent care of young children and possible return to work   Speech Therapy Frequency 2x / week   Treatment/Interventions Cognitive reorganization;SLP instruction and feedback;Compensatory strategies;Internal/external aids;Environmental controls;Patient/family education;Functional tasks;Cueing hierarchy;Language facilitation   Potential to Achieve Goals Good   Potential Considerations Ability to learn/carryover information;Severity of impairments   Consulted and Agree with Plan of Care Patient        Problem List Patient Active Problem List   Diagnosis Date Noted  . Nontraumatic cortical hemorrhage of cerebral hemisphere ([place])   . Seizure ([place])   . Seizures ([place])   . Adjustment disorder with depressed mood   . Contracture of muscle ankle and foot 06/11/2015  . Hemiplga fol ntrm intcrbl hemor aff right dominant side 06/06/2015  . Left-sided intracerebral hemorrhage ([place]) 06/04/2015  . Seizure disorder as sequela of cerebrovascular accident ([place])   . Seizure disorder ([place]) 06/03/2015  . Sepsis ([place]) 06/03/2015  . UTI (urinary tract infection) 06/03/2015  . Hypotension 06/02/2015  . Right spastic hemiparesis ([place]) 05/28/2015  . 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 History of anxiety disorder 05/28/2015  . ICH (intracerebral hemorrhage) ([place])   . Seizure disorder, nonconvulsive, with status epilepticus ([place])   . Cerebral venous thrombosis of cortical vein   . Cytotoxic cerebral edema ([place])   . IVH (intraventricular hemorrhage) ([place])   . Term pregnancy 05/07/2015  . Spontaneous vaginal delivery 05/07/2015    Dickey Caamano, Annye Rusk  MS, CCC-SLP  08/07/2015, 10:14 AM  [facility] [address] [place], [place], [address] Phone: [phone]   Fax:  [phone]   Name: Ashley [name] MRN:  [id number] Date of Birth: 10-01-1976

## 2015-08-08 ENCOUNTER — Encounter: Payer: Self-pay | Admitting: Physical Therapy

## 2015-08-08 NOTE — Therapy (Signed)
[facility] [address] [place], [place], [address] Phone: [phone]   Fax:  [phone]  Physical Therapy Treatment  Patient Details  Name: Ashley [name] MRN: [id number] Date of Birth: 12/17/1975 No Data Recorded  Encounter Date: 08/07/2015      PT End of Session - 08/08/15 1100    Visit Number 12   Number of Visits 17   Date for PT Re-Evaluation 08/28/15   Authorization Type BCBS. Per Lisa Long, pt gets 30 visits for PT and 30 for OT.   Authorization - Visit Number 12   Authorization - Number of Visits 30   PT Start Time 1020   PT Stop Time 1106   PT Time Calculation (min) 46 min      Past Medical History  Diagnosis Date  . Anxiety   . ICH (intracerebral hemorrhage) ([facility])     Past Surgical History  Procedure Laterality Date  . Dilation and curettage of uterus      There were no vitals filed for this visit.  Visit Diagnosis:  Right spastic hemiparesis (HCC)  Abnormality of gait  Right leg weakness      Subjective Assessment - 08/08/15 1045    Subjective Pt reports she is motivated and independent - has been doing alot of exercises at home; reports R shoulder is bothering her some today   Pertinent History Seizures, low BP   Patient Stated Goals "Be as close to normal as much as possible and to go back to running" Get back to taking care of my kids, 7 weeks old and 6 and 8 years old   Currently in Pain? Yes   Pain Score 3    Pain Location Shoulder   Pain Orientation Right   Pain Descriptors / Indicators Aching   Pain Type Acute pain   Pain Onset Today   Pain Frequency Intermittent   Aggravating Factors  certain positions   Pain Relieving Factors repositioning   Multiple Pain Sites No                         [facility] Adult PT Treatment/Exercise - 08/08/15 0001    Ambulation/Gait   Ambulation/Gait Yes   Ambulation/Gait Assistance Other (comment)  tactile and verbal cues  for R hip protraction and to relax L   Ambulation/Gait Assistance Details no device used - on flat level surface in clinic gym   Ambulation Distance (Feet) 480 Feet   Assistive device None   Gait Pattern Decreased stance time - right;Decreased hip/knee flexion - right;Decreased dorsiflexion - right;Decreased weight shift to right;Decreased arm swing - right;Trunk rotated posteriorly on right   Ambulation Surface Level;Indoor   Stairs Yes   Stairs Assistance 5: Supervision   Stair Management Technique One rail Left;Step to pattern;Alternating pattern   Number of Stairs 4   Height of Stairs 6   Gait Comments mod assist to keep R pelvis protracted   Lumbar Exercises: Stretches   Active Hamstring Stretch 1 rep;30 seconds  seated   Lumbar Exercises: Supine   Clam 5 reps;3 seconds   Bridge 5 reps;5 seconds   Lumbar Exercises: Sidelying   Hip Abduction 5 reps  pt unable to maintain neutral position (without hip flexion)   Lumbar Exercises: Prone   Other Prone Lumbar Exercises Prone R knee flexion 10 reps with 5 sec hold attempted with controlled descension for eccentric strengthening   Lumbar Exercises: Quadruped   Straight Leg Raise 10 reps    with min to mod assist for stability   Other Quadruped Lumbar Exercises R knee flexion/extension with mod to max assist to prevent R hip flexion due to synergy pattern  10 reps     Bridging with hip abduction/adduction x 10 reps;  R 1/2 bridge x 5 reps 5 sec hold  Seated R knee flexion - to approximately 110 degrees x 5 reps  Neuro re-ed:  Tall kneeling - at side of mat with UE -support - with head turns for trunk stabilization; ambulating sideways R and L in tall kneeling on mat on floor 10' each direction with min assist with UE support prn R 1/2 kneeling for hip/pelvis stabilization and strengthening - with mod to max assist due to weakness  Quadriped - R hip extension with knee flexed at 90 degrees attempted - pt needed max assist to prevent  hip flexion due to Flexor synergy pattern        PT Education - 08/08/15 1059    Education provided Yes   Education Details heel cord stretch in standing   Person(s) Educated Patient   Methods Explanation;Demonstration   Comprehension Verbalized understanding;Returned demonstration          PT Short Term Goals - 07/26/15 1451    PT SHORT TERM GOAL #1   Title Pt will be IND in HEP to improve strength, balance, and endurance. Target date: 07/27/15.   Status Partially Met   PT SHORT TERM GOAL #2   Title Pt will improve gait speed to >/=1.34f/sec to decrease falls risk. Target date: 07/27/15.   Status Achieved   PT SHORT TERM GOAL #3   Title Pt will ambulate 300' with LRAD, over even terrain, at MOD I level to improve functional mobility. Target date: 07/27/15.   Status Achieved   PT SHORT TERM GOAL #4   Title Pt will perform TUG in </=13.5 seconds with LRAD to decr. falls risk. Target date: 07/27/15.   Status Partially Met   PT SHORT TERM GOAL #5   Title Perform BERG and write STG and LTG as appropriate. Target date: 07/27/15.   Status Achieved   PT SHORT TERM GOAL #6   Title Pt will improve BERG to >/=51/56 to decr. falls risk. Target date: 07/27/15.   Status Partially Met           PT Long Term Goals - 07/20/15 1125    PT LONG TERM GOAL #1   Title Pt will ambulate 1000' over even/uneven terrain, with LRAD, at MOD I level to improve functional mobiliity. Target date: 08/24/15.   Status On-going   PT LONG TERM GOAL #2   Title Pt will amb. 300' over even terrain without an AD, IND, to safely amb. in home. Target date: 08/24/15.   Status On-going   PT LONG TERM GOAL #3   Title Pt will improve gait speed to >/=2.632fsec, with LRAD, to amb. safely in the community. Target date: 08/24/15.   Status On-going   PT LONG TERM GOAL #4   Title Pt will verbalize plans to join a fitness center upon d/c from PT to continue to maintain strength and endurance gains made during PT.  Target date: 08/24/15.   Status On-going   PT LONG TERM GOAL #5   Title Pt improve BERG score to >/=55/56 to decr. falls risk. Target date: 08/24/15.   Status On-going               Plan - 08/08/15 1101    Clinical Impression  Statement Pt has decreased isolated R knee flexion during gait due to spasticity with flexor synergy pattern occuring during gait; R pelvis is retracted, with pt needing mod to max assist for neutral alignment and also cues to depress L shoulder and elevate R shoulder; pt has difficulty  maintaining R hip exnsion with R knee flexion due to flexor synergy pattern   Pt will benefit from skilled therapeutic intervention in order to improve on the following deficits Abnormal gait;Decreased endurance;Impaired sensation;Decreased knowledge of precautions;Decreased activity tolerance;Decreased knowledge of use of DME;Decreased strength;Impaired UE functional use;Impaired tone;Decreased balance;Decreased mobility;Decreased cognition;Decreased range of motion;Decreased safety awareness;Decreased coordination;Impaired flexibility   Rehab Potential Good   PT Frequency 2x / week   PT Duration 8 weeks   PT Treatment/Interventions ADLs/Self Care Home Management;Neuromuscular re-education;Cognitive remediation;Biofeedback;DME Instruction;Gait training;Stair training;Canalith Repostioning;Patient/family education;Orthotic Fit/Training;Balance training;Therapeutic exercise;Manual techniques;Therapeutic activities;Vestibular   PT Next Visit Plan Cont RLE strengthening - activities to isolate R knee flexion without hip flexion to decr. synergistic movements; give pic for heel cord stretch   PT Home Exercise Plan added standing Achilles tendon stretch RLE on 08-07-15 - verbally instructed with no picture given due to time constraint   Consulted and Agree with Plan of Care Patient        Problem List Patient Active Problem List   Diagnosis Date Noted  . Nontraumatic cortical  hemorrhage of cerebral hemisphere (HCC)   . Seizure (HCC)   . Seizures (HCC)   . Adjustment disorder with depressed mood   . Contracture of muscle ankle and foot 06/11/2015  . Hemiplga fol ntrm intcrbl hemor aff right dominant side 06/06/2015  . Left-sided intracerebral hemorrhage (HCC) 06/04/2015  . Seizure disorder as sequela of cerebrovascular accident (HCC)   . Seizure disorder (HCC) 06/03/2015  . Sepsis (HCC) 06/03/2015  . UTI (urinary tract infection) 06/03/2015  . Hypotension 06/02/2015  . Right spastic hemiparesis (HCC) 05/28/2015  . 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 History of anxiety disorder 05/28/2015  . ICH (intracerebral hemorrhage) (HCC)   . Seizure disorder, nonconvulsive, with status epilepticus (HCC)   . Cerebral venous thrombosis of cortical vein   . Cytotoxic cerebral edema (HCC)   . IVH (intraventricular hemorrhage) (HCC)   . Term pregnancy 05/07/2015  . Spontaneous vaginal delivery 05/07/2015    ,  Suzanne, PT 08/08/2015, 11:19 AM  [facility] [address] [place], [place], [address] Phone: [phone]   Fax:  [phone]  Name: Ashley [name] MRN: [id number] Date of Birth: 07/12/1976     

## 2015-08-08 NOTE — Patient Instructions (Signed)
Gastroc / Heel Cord Stretch - On Step    Stand with heels over edge of stair. Holding rail, lower heels until stretch is felt in calf of legs. Repeat _1-2_ times. Do __3_ times per day.  Use 2" step - do without brace on RLE - hold 45-60 secs and stand erect.  Copyright  VHI. All rights reserved.

## 2015-08-09 ENCOUNTER — Ambulatory Visit: Payer: BLUE CROSS/BLUE SHIELD | Admitting: Occupational Therapy

## 2015-08-09 ENCOUNTER — Ambulatory Visit: Payer: BLUE CROSS/BLUE SHIELD | Admitting: Speech Pathology

## 2015-08-09 ENCOUNTER — Ambulatory Visit: Payer: BLUE CROSS/BLUE SHIELD

## 2015-08-09 DIAGNOSIS — R269 Unspecified abnormalities of gait and mobility: Secondary | ICD-10-CM

## 2015-08-09 DIAGNOSIS — R29898 Other symptoms and signs involving the musculoskeletal system: Secondary | ICD-10-CM

## 2015-08-09 DIAGNOSIS — G8111 Spastic hemiplegia affecting right dominant side: Secondary | ICD-10-CM

## 2015-08-09 DIAGNOSIS — IMO0002 Reserved for concepts with insufficient information to code with codable children: Secondary | ICD-10-CM

## 2015-08-09 DIAGNOSIS — R41841 Cognitive communication deficit: Secondary | ICD-10-CM

## 2015-08-09 NOTE — Therapy (Signed)
[name] [address] [place], [place], [address] Phone: [phone]   Fax:  [phone]  Occupational Therapy Treatment  Patient Details  Name: Ashley [name] MRN: [id number] Date of Birth: [birth year] No Data Recorded  Encounter Date: 08/09/2015      OT End of Session - 08/09/15 1231    Visit Number 13   Number of Visits 17   Date for OT Re-Evaluation 08/29/15   Authorization Type BCBS   Authorization Time Period 59 visit OT   Authorization - Visit Number 13   Authorization - Number of Visits 30   OT Start Time 1018   OT Stop Time 1100   OT Time Calculation (min) 42 min   Activity Tolerance Patient tolerated treatment well      Past Medical History  Diagnosis Date  . Anxiety   . ICH (intracerebral hemorrhage) [facility])     Past Surgical History  Procedure Laterality Date  . Dilation and curettage of uterus      There were no vitals filed for this visit.  Visit Diagnosis:  Right spastic hemiparesis ([place])  Weakness due to cerebrovascular accident      Subjective Assessment - 08/09/15 1026    Subjective  The ultrasound seems to help with the shoulder pain   Pertinent History see epic snapshot; pt with [facility] 2 weeks after deliverying her 3rd child   Patient Stated Goals to be able to use my R arm normally   Currently in Pain? Yes   Pain Score 2    Pain Location Shoulder   Pain Orientation Right   Pain Descriptors / Indicators Aching;Sore   Pain Type Acute pain   Pain Onset 1 to 4 weeks ago   Pain Frequency Intermittent   Aggravating Factors  certain positions   Pain Relieving Factors repositioning                      OT Treatments/Exercises (OP) - 08/09/15 0001    Neurological Re-education Exercises   Other Exercises 1 Resistive scapula retraction with yellow theraband (rows) x 10 at door. Resistive sh. extension with yellow theraband x 10 at door. Both for scapula  stabalization/strengthening and issued as HEP (See pt instructions)    Other Exercises 2 Wall push ups x 10 reps for scapula stabalization and issued as HEP but advised only to perform with assist (due to balance and to keep Rt hand from slipping). Also performed active sh. extension standing with chair support, bending at trunk with min cues to perform correctly (see pt instructions)   Modalities   Modalities Ultrasound   Ultrasound   Ultrasound Location Lateral shoulder Rt   Ultrasound Parameters 1.0 wts/cm2, 3Mhz, continuous x 8 minutes   Ultrasound Goals Pain   Manual Therapy   Manual Therapy Taping   Manual therapy comments to relax middle deltoid, activate scapula retracion and sh. extension and ER, and shoulder correction pc. for ER   Kinesiotex Inhibit Muscle;Facilitate Muscle                OT Education - 08/09/15 1221    Education provided Yes   Education Details HEP    Person(s) Educated Patient   Methods Explanation;Demonstration;Handout   Comprehension Verbalized understanding;Returned demonstration          OT Short Term Goals - 07/26/15 1333    OT SHORT TERM GOAL #1   Title I with HEP.   Status Achieved   OT SHORT TERM  GOAL #2   Title Pt will demonstrate impoved fine motor coordinationas evidenced by decreasing RUE 9 hole peg test score to 35 secs or less.   Baseline 28.44 secs   Status Achieved   OT SHORT TERM GOAL #3   Title Pt will perform all basic ADLs at a supervision level.   Status Achieved   OT SHORT TERM GOAL #4   Title Pt will use RUE as an active assist for ADLS/IADLS at least 50 % of the time.   Status Achieved   OT SHORT TERM GOAL #5   Title Pt will perform basic home management/ cooking at min A level.   Status Achieved           OT Long Term Goals - 07/12/15 1149    OT LONG TERM GOAL #1   Title Pt will perfom all basic ADLS modidied independently.   Baseline due 08/27/15   Time 8   Period Weeks   Status On-going   OT LONG  TERM GOAL #2   Title Pt will perform basic home management/ cooking at a supervision level demonstrating good safety awareness.   Time 8   Period Weeks   Status On-going   OT LONG TERM GOAL #3   Title Pt will demonstrate ability to retrieve a 3 lbs weight at 120 x 5 reps without dropping with RUE   Time 8   Period Weeks   Status On-going   OT LONG TERM GOAL #4   Title Pt will resume use of RUE as dominant hand at least 75% of the time for ADLs/ IADLs.   Time 8   Period Weeks   Status On-going   OT LONG TERM GOAL #5   Title Pt will demonstrate ability to perform a physical and cognitive task simultaneously with 80% or better accuracy.   Time 8   Period Weeks   Status On-going   OT LONG TERM GOAL #6   Title Pt will demonstrate improved fine motor coordination as evidenced by decreasing RUE 9 hole peg test score to 30 secs or less.   Time 8   Period Weeks   Status On-going               Plan - 08/09/15 1231    Clinical Impression Statement Pt with noted Rt scapula weakness and some winging which contributes to pain due to sh. girdle compensations. Pain management and scapula stabalization addressed today   Plan issue and review HEP, assess kinesiotape, continue NMR and scapula stabalization for pain mngmt   OT Home Exercise Plan issued: ball ex in supine, trunk for A-p tilts with trunk rotation(07/05/15)   Consulted and Agree with Plan of Care Patient        Problem List Patient Active Problem List   Diagnosis Date Noted  . Nontraumatic cortical hemorrhage of cerebral hemisphere ([place])   . Seizure ([place])   . Seizures ([place])   . Adjustment disorder with depressed mood   . Contracture of muscle ankle and foot 06/11/2015  . Hemiplga fol ntrm intcrbl hemor aff right dominant side 06/06/2015  . Left-sided intracerebral hemorrhage ([place]) 06/04/2015  . Seizure disorder as sequela of cerebrovascular accident ([place])   . Seizure disorder ([place]) 06/03/2015  . Sepsis ([place])  06/03/2015  . UTI (urinary tract infection) 06/03/2015  . Hypotension 06/02/2015  . Right spastic hemiparesis ([place]) 05/28/2015  . 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 History of anxiety disorder 05/28/2015  . ICH (intracerebral hemorrhage) ([place])   .  Seizure disorder, nonconvulsive, with status epilepticus ([place])   . Cerebral venous thrombosis of cortical vein   . Cytotoxic cerebral edema ([place])   . IVH (intraventricular hemorrhage) ([place])   . Term pregnancy 05/07/2015  . Spontaneous vaginal delivery 05/07/2015    Carey Bullocks, OTR/L 08/09/2015, 12:36 PM  [facility] [address] [place], [place], [address] Phone: [phone]   Fax:  [phone]  Name: Pinky Ravan MRN: [id number] Date of Birth: August 06, 1976

## 2015-08-09 NOTE — Patient Instructions (Signed)
Divided attention homework

## 2015-08-09 NOTE — Patient Instructions (Signed)
  Place R foot (ball of foot) on 2 inch step/book, with heel on floor. Shift your weight/hips forward and to the right leg. Hold for 30 seconds. Repeat 3 times, 2-3 times/day.

## 2015-08-09 NOTE — Patient Instructions (Addendum)
1. Wall Push-Up    With feet and hands shoulder-width apart, lean into wall, then push away from wall. Repeat __10__ times  Do _2-3___ sessions per day.  2. (Clinic) Extension / Flexion (Assist)    Face door, right arm as far forward and up as is pain free. Pull arm down toward side with yellow theraband. Repeat __10__ times per set. Do 2-3 times per day     3. Then face door, and pull theraband back with arm bent ("sawing" motion) keeping forearm parallel to ground x 10 reps, 2 sets   4. Progressive Resisted: Extension (Standing)    Rt hand only, NO WEIGHT raise Rt arm back, keeping elbow straight. Hold chair with Lt hand for balance Repeat _10___ times per set. Do _2-3___ sessions per day.

## 2015-08-09 NOTE — Therapy (Signed)
[facility] [address] [place], [place], [address] Phone: [phone]   Fax:  [phone]  Physical Therapy Treatment  Patient Details  Name: Ashley [name] MRN: [id number] Date of Birth: 1976/05/13 No Data Recorded  Encounter Date: 08/09/2015      PT End of Session - 08/09/15 1302    Visit Number 13   Number of Visits 17   Date for PT Re-Evaluation 08/28/15   Authorization Type [employer]. Per Laverda Page, pt gets 30 visits for PT and 30 for OT.   Authorization - Visit Number 13   Authorization - Number of Visits 30   PT Start Time 6387   PT Stop Time 1146   PT Time Calculation (min) 43 min   Equipment Utilized During Treatment Gait belt   Activity Tolerance Patient tolerated treatment well   Behavior During Therapy WFL for tasks assessed/performed      Past Medical History  Diagnosis Date  . Anxiety   . ICH (intracerebral hemorrhage) [facility])     Past Surgical History  Procedure Laterality Date  . Dilation and curettage of uterus      There were no vitals filed for this visit.  Visit Diagnosis:  Abnormality of gait  Right leg weakness      Subjective Assessment - 08/09/15 1106    Subjective Pt denied falls since last visit. Pt reported she is not using SPC at home, as she feels that balance is better and holds onto her husband when walking longer distances in the community.   Pertinent History Seizures, low BP   Patient Stated Goals "Be as close to normal as much as possible and to go back to running" Get back to taking care of my kids, 20 weeks old and 63 and 52 years old   Currently in Pain? No/denies         Gait: On treadmill: pt performed gait training 2x4minute bouts with supervision. Cues to improve stride length, heel strike, terminal stance push off and R knee flexion. Pt's step length improved during second trial. Pt amb. On treadmill at 1.65mph for 6 minutes with similar cues mentioned above .  Distance: 0.69miles. HR WNL during session. Pt required seated rest breaks after each bout of amb. 2/2 fatigue.   Therex:  Pt performed R gastroc stretch on 2" step, with cues for technique. Please see pt instructions for details.                   Self care:     PT Education - 08/09/15 1301    Education provided Yes   Education Details Reviewed and performed gastroc stretch (RLE), provided handout. PT educated pt on the importance of using SPC when amb. longer distance, and inside the house when RLE is fatigued. PT discussed pt brining in new shoes, as pt is having a difficult time placing R AFO in shoe.   Person(s) Educated Patient   Methods Explanation   Comprehension Verbalized understanding          PT Short Term Goals - 07/26/15 1451    PT SHORT TERM GOAL #1   Title Pt will be IND in HEP to improve strength, balance, and endurance. Target date: 07/27/15.   Status Partially Met   PT SHORT TERM GOAL #2   Title Pt will improve gait speed to >/=1.42ft/sec to decrease falls risk. Target date: 07/27/15.   Status Achieved   PT SHORT TERM GOAL #3   Title Pt will  ambulate 300' with LRAD, over even terrain, at MOD I level to improve functional mobility. Target date: 07/27/15.   Status Achieved   PT SHORT TERM GOAL #4   Title Pt will perform TUG in </=13.5 seconds with LRAD to decr. falls risk. Target date: 07/27/15.   Status Partially Met   PT SHORT TERM GOAL #5   Title Perform BERG and write STG and LTG as appropriate. Target date: 07/27/15.   Status Achieved   PT SHORT TERM GOAL #6   Title Pt will improve BERG to >/=51/56 to decr. falls risk. Target date: 07/27/15.   Status Partially Met           PT Long Term Goals - 07/20/15 1125    PT LONG TERM GOAL #1   Title Pt will ambulate 1000' over even/uneven terrain, with LRAD, at MOD I level to improve functional mobiliity. Target date: 08/24/15.   Status On-going   PT LONG TERM GOAL #2   Title Pt will amb.  300' over even terrain without an AD, IND, to safely amb. in home. Target date: 08/24/15.   Status On-going   PT LONG TERM GOAL #3   Title Pt will improve gait speed to >/=2.55f/sec, with LRAD, to amb. safely in the community. Target date: 08/24/15.   Status On-going   PT LONG TERM GOAL #4   Title Pt will verbalize plans to join a fitness center upon d/c from PT to continue to maintain strength and endurance gains made during PT. Target date: 08/24/15.   Status On-going   PT LONG TERM GOAL #5   Title Pt improve BERG score to >/=55/56 to decr. falls risk. Target date: 08/24/15.   Status On-going               Plan - 08/09/15 1302    Clinical Impression Statement Pt demonstrated progress, as she was able to improve B stride length and L step length during treadmill gait training. Pt continues to experience decreased time spent on R LE according to treadmill testing results and decrease B heel strike. Continue with POC.   Pt will benefit from skilled therapeutic intervention in order to improve on the following deficits Abnormal gait;Decreased endurance;Impaired sensation;Decreased knowledge of precautions;Decreased activity tolerance;Decreased knowledge of use of DME;Decreased strength;Impaired UE functional use;Impaired tone;Decreased balance;Decreased mobility;Decreased cognition;Decreased range of motion;Decreased safety awareness;Decreased coordination;Impaired flexibility   Rehab Potential Good   Clinical Impairments Affecting Rehab Potential Seizures which may limit intensity of PT   PT Frequency 2x / week   PT Duration 8 weeks   PT Treatment/Interventions ADLs/Self Care Home Management;Neuromuscular re-education;Cognitive remediation;Biofeedback;DME Instruction;Gait training;Stair training;Canalith Repostioning;Patient/family education;Orthotic Fit/Training;Balance training;Therapeutic exercise;Manual techniques;Therapeutic activities;Vestibular   PT Next Visit Plan Gastroc  (push-off) power training, treadmill training to improve stride length and endurance. Backwards walking.   PT Home Exercise Plan Stretching/strength/balance HEP   Consulted and Agree with Plan of Care Patient        Problem List Patient Active Problem List   Diagnosis Date Noted  . Nontraumatic cortical hemorrhage of cerebral hemisphere ([facility]   . Seizure ([facility]   . Seizures ([place]   . Adjustment disorder with depressed mood   . Contracture of muscle ankle and foot 06/11/2015  . Hemiplga fol ntrm intcrbl hemor aff right dominant side 06/06/2015  . Left-sided intracerebral hemorrhage ([facility] 06/04/2015  . Seizure disorder as sequela of cerebrovascular accident ([facility]   . Seizure disorder ([facility] 06/03/2015  . Sepsis ([facility] 06/03/2015  . UTI (urinary tract infection) 06/03/2015  .  Hypotension 06/02/2015  . Right spastic hemiparesis ([place]) 05/28/2015  . 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 History of anxiety disorder 05/28/2015  . ICH (intracerebral hemorrhage) ([place])   . Seizure disorder, nonconvulsive, with status epilepticus ([place])   . Cerebral venous thrombosis of cortical vein   . Cytotoxic cerebral edema ([place])   . IVH (intraventricular hemorrhage) ([place])   . Term pregnancy 05/07/2015  . Spontaneous vaginal delivery 05/07/2015    Terree Gaultney L 08/09/2015, 1:05 PM  [facility] [address] [place], [place], [address] Phone: [phone]   Fax:  [phone]  Name: Ashley [name] MRN: [id number] Date of Birth: 07/20/1976    Geoffry Paradise, PT,DPT 08/09/2015 1:05 PM Phone: [phone] Fax: [phone]

## 2015-08-09 NOTE — Therapy (Signed)
[facility] [address] [place], [place], [address] Phone: [phone]   Fax:  [phone]  Speech Language Pathology Treatment  Patient Details  Name: Ashley [name] MRN: [id number] Date of Birth: 27-Aug-1976 No Data Recorded  Encounter Date: 08/09/2015      End of Session - 08/09/15 1011    Visit Number 13   Number of Visits 24   Date for SLP Re-Evaluation 09/12/15   SLP Start Time 0935   SLP Stop Time  1015   SLP Time Calculation (min) 40 min      Past Medical History  Diagnosis Date  . Anxiety   . ICH (intracerebral hemorrhage) [facility])     Past Surgical History  Procedure Laterality Date  . Dilation and curettage of uterus      There were no vitals filed for this visit.  Visit Diagnosis: Cognitive communication deficit      Subjective Assessment - 08/09/15 0942    Subjective "I did my homework - It was fun - I wrote down the commercials"               ADULT SLP TREATMENT - 08/09/15 0946    General Information   Behavior/Cognition Alert;Cooperative;Pleasant mood   Treatment Provided   Treatment provided Cognitive-Linquistic   Pain Assessment   Pain Assessment No/denies pain   Cognitive-Linquistic Treatment   Treatment focused on Cognition   Skilled Treatment Divided attention attending to moderately complex card game and simple conversation with usual mod A to attend to game while conversing. Divide attention between category matrix (written) and verbalizing definition to word aloud (auditory/spoken) with extended time  and primarily using alternating attention.   Assessment / Recommendations / Plan   Plan Continue with current plan of care   Progression Toward Goals   Progression toward goals Progressing toward goals          SLP Education - 08/09/15 1009    Education provided No          SLP Short Term Goals - 08/09/15 1010    SLP SHORT TERM GOAL #1   Title pt will  alternate attention between simple cognitive-linguistic tasks with 85% success and WFL switch time over 3 sessions   Period --  or 12 visits, for all STGs   Status Achieved   SLP SHORT TERM GOAL #2   Title pt will demo verbal safety awareness related to deficits   Status Achieved   SLP SHORT TERM GOAL #3   Title pt will complete mod complex time, money, sequencing, calendar problems 95% with modified independence (self-correction)   Status Achieved   SLP SHORT TERM GOAL #4   Title pt will demo divided attention in two simple tasks 80% of the time with rare min A   Time 1   Period Weeks   Status On-going          SLP Long Term Goals - 08/09/15 1011    SLP LONG TERM GOAL #1   Title pt will divide attention between one simple task and one mod complex cognitive linguistic task 90% each task   Time 3   Period Weeks   Status Revised   SLP LONG TERM GOAL #2   Title pt will demo anticipatory awareness by using compensatory measures for memory and/or decr'd attention x1 in a session over 4 sessions   Time 3   Period Weeks   Status On-going   SLP LONG TERM GOAL #3   Title  pt will demo executive function in a simple task with rare min A   Time 3   Period Weeks   Status On-going          Plan - 08/09/15 1010    Clinical Impression Statement Divided attention tasks with 70% to 90% with min A - continue skilled ST to maximize cognition for independent care of young children and possible return to work   Speech Therapy Frequency 2x / week   Duration 4 weeks   Treatment/Interventions Cognitive reorganization;SLP instruction and feedback;Compensatory strategies;Internal/external aids;Environmental controls;Patient/family education;Functional tasks;Cueing hierarchy;Language facilitation   Potential Considerations Ability to learn/carryover information;Severity of impairments   Consulted and Agree with Plan of Care Patient        Problem List Patient Active Problem List    Diagnosis Date Noted  . Nontraumatic cortical hemorrhage of cerebral hemisphere ([place])   . Seizure ([place])   . Seizures ([place])   . Adjustment disorder with depressed mood   . Contracture of muscle ankle and foot 06/11/2015  . Hemiplga fol ntrm intcrbl hemor aff right dominant side 06/06/2015  . Left-sided intracerebral hemorrhage ([place]) 06/04/2015  . Seizure disorder as sequela of cerebrovascular accident ([place])   . Seizure disorder ([place]) 06/03/2015  . Sepsis ([place]) 06/03/2015  . UTI (urinary tract infection) 06/03/2015  . Hypotension 06/02/2015  . Right spastic hemiparesis ([place]) 05/28/2015  . 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 History of anxiety disorder 05/28/2015  . ICH (intracerebral hemorrhage) ([place])   . Seizure disorder, nonconvulsive, with status epilepticus ([place])   . Cerebral venous thrombosis of cortical vein   . Cytotoxic cerebral edema ([place])   . IVH (intraventricular hemorrhage) ([place])   . Term pregnancy 05/07/2015  . Spontaneous vaginal delivery 05/07/2015    Angelique Chevalier, Annye Rusk MS, CCC-SLP 08/09/2015, 10:13 AM  [facility] [address] [place], [place], [address] Phone: [phone]   Fax:  [phone]   Name: Ashley [name] MRN: [id number] Date of Birth: September 18, 1976

## 2015-08-14 ENCOUNTER — Ambulatory Visit: Payer: BLUE CROSS/BLUE SHIELD

## 2015-08-14 ENCOUNTER — Encounter: Payer: Self-pay | Admitting: Physical Medicine & Rehabilitation

## 2015-08-14 ENCOUNTER — Ambulatory Visit: Payer: BLUE CROSS/BLUE SHIELD | Admitting: Occupational Therapy

## 2015-08-14 ENCOUNTER — Ambulatory Visit (HOSPITAL_BASED_OUTPATIENT_CLINIC_OR_DEPARTMENT_OTHER): Payer: BLUE CROSS/BLUE SHIELD | Admitting: Physical Medicine & Rehabilitation

## 2015-08-14 ENCOUNTER — Encounter: Payer: BLUE CROSS/BLUE SHIELD | Attending: Physical Medicine & Rehabilitation

## 2015-08-14 ENCOUNTER — Ambulatory Visit: Payer: BLUE CROSS/BLUE SHIELD | Attending: Physical Medicine & Rehabilitation

## 2015-08-14 VITALS — BP 97/68 | HR 78 | Resp 14

## 2015-08-14 DIAGNOSIS — I69120 Aphasia following nontraumatic intracerebral hemorrhage: Secondary | ICD-10-CM

## 2015-08-14 DIAGNOSIS — I69151 Hemiplegia and hemiparesis following nontraumatic intracerebral hemorrhage affecting right dominant side: Secondary | ICD-10-CM | POA: Diagnosis not present

## 2015-08-14 DIAGNOSIS — M25511 Pain in right shoulder: Secondary | ICD-10-CM | POA: Diagnosis present

## 2015-08-14 DIAGNOSIS — IMO0002 Reserved for concepts with insufficient information to code with codable children: Secondary | ICD-10-CM

## 2015-08-14 DIAGNOSIS — M7541 Impingement syndrome of right shoulder: Secondary | ICD-10-CM

## 2015-08-14 DIAGNOSIS — R279 Unspecified lack of coordination: Secondary | ICD-10-CM | POA: Insufficient documentation

## 2015-08-14 DIAGNOSIS — R531 Weakness: Secondary | ICD-10-CM | POA: Diagnosis present

## 2015-08-14 DIAGNOSIS — R41841 Cognitive communication deficit: Secondary | ICD-10-CM | POA: Diagnosis not present

## 2015-08-14 DIAGNOSIS — G8111 Spastic hemiplegia affecting right dominant side: Secondary | ICD-10-CM

## 2015-08-14 DIAGNOSIS — F419 Anxiety disorder, unspecified: Secondary | ICD-10-CM | POA: Diagnosis not present

## 2015-08-14 DIAGNOSIS — I612 Nontraumatic intracerebral hemorrhage in hemisphere, unspecified: Secondary | ICD-10-CM

## 2015-08-14 DIAGNOSIS — M7581 Other shoulder lesions, right shoulder: Secondary | ICD-10-CM | POA: Diagnosis not present

## 2015-08-14 DIAGNOSIS — R29898 Other symptoms and signs involving the musculoskeletal system: Secondary | ICD-10-CM | POA: Diagnosis present

## 2015-08-14 DIAGNOSIS — R269 Unspecified abnormalities of gait and mobility: Secondary | ICD-10-CM | POA: Diagnosis present

## 2015-08-14 DIAGNOSIS — R278 Other lack of coordination: Secondary | ICD-10-CM

## 2015-08-14 DIAGNOSIS — I69898 Other sequelae of other cerebrovascular disease: Secondary | ICD-10-CM | POA: Diagnosis present

## 2015-08-14 DIAGNOSIS — Z79899 Other long term (current) drug therapy: Secondary | ICD-10-CM | POA: Diagnosis not present

## 2015-08-14 MED ORDER — DICLOFENAC SODIUM 1 % TD GEL: 2.0000 g | Freq: Four times a day (QID) | TRANSDERMAL | Status: DC

## 2015-08-14 MED ORDER — TOPIRAMATE 50 MG PO TABS: 50.0000 mg | ORAL_TABLET | Freq: Two times a day (BID) | ORAL | Status: DC

## 2015-08-14 NOTE — Progress Notes (Signed)
Subjective:    Patient ID: Ashley [name], female    DOB: Jul 27, 1976, 39 y.o.   MRN: [id number] 39 y.o. Postpartum female admitted on 05/20/15 with complaints of HA since delivery and developed rightht facial droop with aphasia, right sided weakness and seizures. She was found to have acute large left fronto-parietal IPH/SAH and was started on Keppra for treatment.  Work up without definite cerebral venous sinus/cortical vein thrombosis and no anticoagulation needed per neurology. HPI Interval history pain in the right shoulder after being assisted from a seated position to standing pulling on both arms. Pain with overhead activities.  Complaints of headache not relieved by Topamax 25 mg twice a day as well as Tylenol.  Continues to get physical therapy both at neuro rehabilitation as well as [facility].  No falls at home.  Has experienced some depression and sees a Social worker. Primary physician Dr. Brigitte Pulse added Abilify. Continues on Prozac 20 mg a day  Has questions regarding Keppra duration, will be following up with neurology on this.  Pain Inventory Average Pain 6 Pain Right Now 5 My pain is intermittent and sharp  In the last 24 hours, has pain interfered with the following? General activity 5 Relation with others 3 Enjoyment of life 1 What TIME of day is your pain at its worst? night Sleep (in general) Fair  Pain is worse with: unsure Pain improves with: therapy/exercise and medication Relief from Meds: 7  Mobility walk without assistance walk with assistance use a cane how many minutes can you walk? 30+ ability to climb steps?  yes do you drive?  no transfers alone Do you have any goals in this area?  yes  Function employed # of hrs/week varies  Neuro/Psych numbness  Prior Studies Any changes since last visit?  no  Physicians involved in your care Any changes since last visit?  no   Family History  Problem Relation Age of  Onset  . Cancer Mother   . Cancer Father     pancreatic   Social History   Social History  . Marital Status: Married    Spouse Name: N/A  . Number of Children: N/A  . Years of Education: N/A   Social History Main Topics  . Smoking status: Never Smoker   . Smokeless tobacco: Never Used  . Alcohol Use: Yes     Comment: NONE  WHILE  PREG  . Drug Use: No  . Sexual Activity: Not Asked   Other Topics Concern  . None   Social History Narrative   Past Surgical History  Procedure Laterality Date  . Dilation and curettage of uterus     Past Medical History  Diagnosis Date  . Anxiety   . ICH (intracerebral hemorrhage) ([facility])    BP 97/68 mmHg  Pulse 78  Resp 14  SpO2 99%  Opioid Risk Score:   Fall Risk Score:  `1  Depression screen PHQ 2/9  Depression screen PHQ 2/9 07/16/2015  Decreased Interest 1  Down, Depressed, Hopeless 1  PHQ - 2 Score 2  Altered sleeping 0  Tired, decreased energy 1  Change in appetite 1  Feeling bad or failure about yourself  1  Trouble concentrating 1  Moving slowly or fidgety/restless 1  Suicidal thoughts 1  PHQ-9 Score 8  Difficult doing work/chores Somewhat difficult    \ Review of Systems  Neurological: Positive for numbness.  All other systems reviewed and are negative.      Objective:  Physical Exam  Positive impingement sign right shoulder also decreased external rotation pain with attempted external rotation as well as internal rotation. One half finger breath subluxation Motor strength 3 minus deltoid biceps 2 minus triceps 3 minus finger flexion and extension Able to use right upper extremity to tie her shoes. Right lower extremity 4 minus hip flexion and knee extension to minus ankle dorsiflexion plantarflexion 0 foot eversion, 2 minus foot inversion Positive clonus at the right ankle. Increased tone at the right hamstring with Ashworth grade 2-3 spasticity. With standing she has toe flexor spasticity as well as foot  inversion.In addition her heel is approximate 2 cm off the ground with standing due to plantar flexor spasticity noted with her shoe off  Gait using Ankle/foot orthosis no evidence of toe drag or knee instability Gait using no orthosis Showed no toe drag but did have some hyperextension at the knee  during stance phase  Mood and affect are appropriate Speech shows only occasional word finding deficits     Assessment & Plan:  1. Right spastic hemiplegia due to left CVA peripartum period.  Patient has made a good functional improvement and is modified independent with mobility. She asked for my opinion in regards to walking with an orthotic device versus no orthotic device. The only difference that I see is with knee hyperextension when she does not use an orthotic device. We discussed that this could promote posterior capsule of the knee pain. This would not occur overnight it could occur over time.  Some of her hyperextension may be due to excessive plantar flexor spasticity and certainly does demonstrate this with her shoe off  Recommend botulinum toxin injection would target gastrocnemius 25 units medial and 25 units lateral Soleus 25 units 2 Posterior tibialis 25 units 2 Flexor digitorum longus 25 units 2  2. Hemiplegic shoulder pain likely multifactorial. She does have evidence of impingement syndrome which is relatively new and is interfering with sleep. Will do a subacromial injection today. In addition she has adhesive capsulitis and this would require some further OT.   Shoulder injection Right  Indication:Right Shoulder pain not relieved by medication management and other conservative care.  Informed consent was obtained after describing risks and benefits of the procedure with the patient, this includes bleeding, bruising, infection and medication side effects. The patient wishes to proceed and has given written consent. Patient was placed in a seated position. TheRight  shoulder was marked and prepped with betadine in the subacromial area. A 25-gauge 1-1/2 inch needle was inserted into the subacromial area. After negative draw back for blood, a solution containing 1 mL of 6 mg per ML betamethasone and 4 mL of 1% lidocaine was injected. A band aid was applied. The patient tolerated the procedure well. Post procedure instructions were given.

## 2015-08-14 NOTE — Therapy (Signed)
[facility] [address] [place], [place], [address] Phone: [phone]   Fax:  [phone]  Speech Language Pathology Treatment  Patient Details  Name: Ashley [name] MRN: [id number] Date of Birth: 1976/05/21 No Data Recorded  Encounter Date: 08/14/2015      End of Session - 08/14/15 1141    Visit Number 14   Number of Visits 24   Date for SLP Re-Evaluation 09/12/15   Authorization - Visit Number 13   Authorization - Number of Visits 30   SLP Start Time 4193   SLP Stop Time  1100   SLP Time Calculation (min) 42 min   Activity Tolerance Patient tolerated treatment well      Past Medical History  Diagnosis Date  . Anxiety   . ICH (intracerebral hemorrhage) [facility])     Past Surgical History  Procedure Laterality Date  . Dilation and curettage of uterus      There were no vitals filed for this visit.  Visit Diagnosis: Cognitive communication deficit      Subjective Assessment - 08/14/15 1022    Subjective "I did my homework."               ADULT SLP TREATMENT - 08/14/15 1024    General Information   Behavior/Cognition Alert;Cooperative;Pleasant mood   Treatment Provided   Treatment provided Cognitive-Linquistic   Pain Assessment   Pain Assessment No/denies pain   Cognitive-Linquistic Treatment   Treatment focused on Cognition   Skilled Treatment Discussion with pt re: practice time with divided attention needs to be separate from children/childcare due to too challenging for pt's ability level. Divided attention with detailed written directions and conversation with approx 60% alternating attention.     Assessment / Recommendations / Plan   Plan Continue with current plan of care   Progression Toward Goals   Progression toward goals Progressing toward goals          SLP Education - 08/14/15 1140    Education provided Yes   Education Details Divided attention tasks without  childcare/children at this stage of therapy   Person(s) Educated Patient   Methods Explanation   Comprehension Verbalized understanding          SLP Short Term Goals - 08/09/15 1010    SLP SHORT TERM GOAL #1   Title pt will alternate attention between simple cognitive-linguistic tasks with 85% success and WFL switch time over 3 sessions   Period --  or 12 visits, for all STGs   Status Achieved   SLP SHORT TERM GOAL #2   Title pt will demo verbal safety awareness related to deficits   Status Achieved   SLP SHORT TERM GOAL #3   Title pt will complete mod complex time, money, sequencing, calendar problems 95% with modified independence (self-correction)   Status Achieved   SLP SHORT TERM GOAL #4   Title pt will demo divided attention in two simple tasks 80% of the time with rare min A   Time 1   Period Weeks   Status On-going          SLP Long Term Goals - 08/14/15 1143    SLP LONG TERM GOAL #1   Title pt will divide attention between one simple task and one mod complex cognitive linguistic task 90% each task   Time 2   Period Weeks   Status Revised   SLP LONG TERM GOAL #2   Title pt will demo anticipatory awareness by using  compensatory measures for memory and/or decr'd attention x1 in a session over 4 sessions   Time 2   Period Weeks   Status On-going   SLP LONG TERM GOAL #3   Title pt will demo executive function in a simple task with rare min A   Time 2   Period Weeks   Status On-going          Plan - 08/14/15 1142    Clinical Impression Statement Divided attention tasks today with 35% independently, 50% with min A. Skilled ST needs to cont to maximize cognitive function and possibly return to work   Speech Therapy Frequency 2x / week   Duration 2 weeks   Treatment/Interventions Cognitive reorganization;SLP instruction and feedback;Compensatory strategies;Internal/external aids;Environmental controls;Patient/family education;Functional tasks;Cueing  hierarchy;Language facilitation   Potential to Achieve Goals Good   Potential Considerations Ability to learn/carryover information;Severity of impairments        Problem List Patient Active Problem List   Diagnosis Date Noted  . Nontraumatic cortical hemorrhage of cerebral hemisphere ([place])   . Seizure ([place])   . Seizures ([place])   . Adjustment disorder with depressed mood   . Contracture of muscle ankle and foot 06/11/2015  . Hemiplga fol ntrm intcrbl hemor aff right dominant side 06/06/2015  . Left-sided intracerebral hemorrhage ([place]) 06/04/2015  . Seizure disorder as sequela of cerebrovascular accident ([place])   . Seizure disorder ([place]) 06/03/2015  . Sepsis ([place]) 06/03/2015  . UTI (urinary tract infection) 06/03/2015  . Hypotension 06/02/2015  . Right spastic hemiparesis ([place]) 05/28/2015  . 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 History of anxiety disorder 05/28/2015  . ICH (intracerebral hemorrhage) ([place])   . Seizure disorder, nonconvulsive, with status epilepticus ([place])   . Cerebral venous thrombosis of cortical vein   . Cytotoxic cerebral edema ([place])   . IVH (intraventricular hemorrhage) ([place])   . Term pregnancy 05/07/2015  . Spontaneous vaginal delivery 05/07/2015    [facility] , [place], [place]  08/14/2015, 11:44 AM  [facility] [address] [place], [place], [address] Phone: [phone]   Fax:  [phone]   Name: Ashley [name] MRN: [id number] Date of Birth: 04-11-1976

## 2015-08-14 NOTE — Therapy (Signed)
[facility] [address] [place], [place], [address] Phone: [phone]   Fax:  [phone]  Occupational Therapy Treatment  Patient Details  Name: Ashley [name] MRN: [id number] Date of Birth: Oct 06, 1976 No Data Recorded  Encounter Date: 08/14/2015      OT End of Session - 08/14/15 1444    Visit Number 14   Number of Visits 17   Date for OT Re-Evaluation 08/29/15   Authorization Type BCBS   Authorization - Visit Number 14   Authorization - Number of Visits 30   OT Start Time 0941   OT Stop Time 1015   OT Time Calculation (min) 34 min   Activity Tolerance Patient tolerated treatment well   Behavior During Therapy [facility] for tasks assessed/performed      Past Medical History  Diagnosis Date  . Anxiety   . ICH (intracerebral hemorrhage) [facility])     Past Surgical History  Procedure Laterality Date  . Dilation and curettage of uterus      There were no vitals filed for this visit.  Visit Diagnosis:  Weakness due to cerebrovascular accident  Right spastic hemiparesis ([place])  Coordination impairment      Subjective Assessment - 08/14/15 1442    Subjective  Pt reports that  a friend grabbed her shoulder this weekend and it is hurting worse.   Pertinent History see epic snapshot; pt with [facility] 2 weeks after deliverying her 3rd child   Patient Stated Goals to be able to use my R arm normally   Currently in Pain? Yes   Pain Score 5    Pain Location Shoulder   Pain Orientation Right   Pain Descriptors / Indicators Aching   Pain Type Acute pain   Pain Onset 1 to 4 weeks ago   Pain Frequency Intermittent   Aggravating Factors  certain positions   Pain Relieving Factors reposistioning   Effect of Pain on Daily Activities limits functional use.      Treatment: Ultrasound 3MZ, continuous, 0.8 w/cm 2 x 8 mins to lateral upper arm, no adverse reactions, only mild pink coloration and decreased pain. Pt reports that  a friend grabbed her arm at a soccer game an injured it. Seated weightbearing through bilateral UE's with scapular and shoulder retraction, lateral weight shifts with body on arm movements. Standing for shoulder extension/ scapular retraction followed by shoulder extension with yellow theraband x 10 reps, pt attempted rowing exercise, however pt demonstrates pain and therefore activity was discontinued. Wall pushups x 10 reps min v.c.                          OT Short Term Goals - 07/26/15 1333    OT SHORT TERM GOAL #1   Title I with HEP.   Status Achieved   OT SHORT TERM GOAL #2   Title Pt will demonstrate impoved fine motor coordinationas evidenced by decreasing RUE 9 hole peg test score to 35 secs or less.   Baseline 28.44 secs   Status Achieved   OT SHORT TERM GOAL #3   Title Pt will perform all basic ADLs at a supervision level.   Status Achieved   OT SHORT TERM GOAL #4   Title Pt will use RUE as an active assist for ADLS/IADLS at least 50 % of the time.   Status Achieved   OT SHORT TERM GOAL #5   Title Pt will perform basic home management/ cooking at min  A level.   Status Achieved           OT Long Term Goals - 07/12/15 1149    OT LONG TERM GOAL #1   Title Pt will perfom all basic ADLS modidied independently.   Baseline due 08/27/15   Time 8   Period Weeks   Status On-going   OT LONG TERM GOAL #2   Title Pt will perform basic home management/ cooking at a supervision level demonstrating good safety awareness.   Time 8   Period Weeks   Status On-going   OT LONG TERM GOAL #3   Title Pt will demonstrate ability to retrieve a 3 lbs weight at 120 x 5 reps without dropping with RUE   Time 8   Period Weeks   Status On-going   OT LONG TERM GOAL #4   Title Pt will resume use of RUE as dominant hand at least 75% of the time for ADLs/ IADLs.   Time 8   Period Weeks   Status On-going   OT LONG TERM GOAL #5   Title Pt will demonstrate ability to  perform a physical and cognitive task simultaneously with 80% or better accuracy.   Time 8   Period Weeks   Status On-going   OT LONG TERM GOAL #6   Title Pt will demonstrate improved fine motor coordination as evidenced by decreasing RUE 9 hole peg test score to 30 secs or less.   Time 8   Period Weeks   Status On-going               Plan - 08/14/15 1443    Clinical Impression Statement Pt demonstrates increased pain today after a friend grabbed her right arm at a soccer game to help pt keep her balance.   Pt will benefit from skilled therapeutic intervention in order to improve on the following deficits (Retired) Abnormal gait;Decreased coordination;Decreased range of motion;Difficulty walking;Impaired flexibility;Decreased endurance;Decreased safety awareness;Increased edema;Impaired tone;Decreased knowledge of precautions;Decreased activity tolerance;Decreased balance;Decreased knowledge of use of DME;Pain;Impaired UE functional use;Impaired vision/preception;Decreased cognition;Decreased mobility;Decreased strength   Rehab Potential Good   OT Frequency 2x / week   OT Duration 8 weeks   OT Treatment/Interventions Self-care/ADL training;Therapeutic exercise;Patient/family education;Balance training;Ultrasound;Neuromuscular education;Manual Therapy;Splinting;Therapeutic exercises;Energy conservation;Parrafin;DME and/or AE instruction;Therapeutic activities;Cognitive remediation/compensation;Gait Training;Fluidtherapy;Electrical Stimulation;Moist Heat;Contrast Bath;Passive range of motion;Visual/perceptual remediation/compensation   Plan review HEP, ultrasound, NMR   OT Home Exercise Plan issued: ball ex in supine, trunk for A-p tilts with trunk rotation(07/05/15)   Consulted and Agree with Plan of Care Patient        Problem List Patient Active Problem List   Diagnosis Date Noted  . Nontraumatic cortical hemorrhage of cerebral hemisphere ([place])   . Seizure ([place])   . Seizures  ([place])   . Adjustment disorder with depressed mood   . Contracture of muscle ankle and foot 06/11/2015  . Hemiplga fol ntrm intcrbl hemor aff right dominant side 06/06/2015  . Left-sided intracerebral hemorrhage ([place]) 06/04/2015  . Seizure disorder as sequela of cerebrovascular accident ([place])   . Seizure disorder ([place]) 06/03/2015  . Sepsis ([place]) 06/03/2015  . UTI (urinary tract infection) 06/03/2015  . Hypotension 06/02/2015  . Right spastic hemiparesis ([place]) 05/28/2015  . 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 History of anxiety disorder 05/28/2015  . ICH (intracerebral hemorrhage) ([place])   . Seizure disorder, nonconvulsive, with status epilepticus ([place])   . Cerebral venous thrombosis of cortical vein   . Cytotoxic cerebral edema ([place])   .  IVH (intraventricular hemorrhage) ([place])   . Term pregnancy 05/07/2015  . Spontaneous vaginal delivery 05/07/2015    Endora Teresi 08/14/2015, 3:31 PM Theone Murdoch, OTR/L [phone] Phone: [phone] 3:31 PM 08/14/2015 [facility] [address] [place], [place], [address] Phone: [phone]   Fax:  [phone]  Name: Ashley [name] MRN: [id number] Date of Birth: 04/30/1976

## 2015-08-14 NOTE — Patient Instructions (Signed)
OnabotulinumtoxinA injection (Medical Use) What is this medicine? ONABOTULINUMTOXINA (o na BOTT you lye num tox in eh) is a neuro-muscular blocker. This medicine is used to treat crossed eyes, eyelid spasms, severe neck muscle spasms, ankle and toe muscle spasms, and elbow, wrist, and finger muscle spasms. It is also used to treat excessive underarm sweating, to prevent chronic migraine headaches, and to treat loss of bladder control due to neurologic conditions such as multiple sclerosis or spinal cord injury. This medicine may be used for other purposes; ask your health care provider or pharmacist if you have questions. What should I tell my health care provider before I take this medicine? They need to know if you have any of these conditions: -breathing problems -cerebral palsy spasms -difficulty urinating -heart problems -history of surgery where this medicine is going to be used -infection at the site where this medicine is going to be used -myasthenia gravis or other neurologic disease -nerve or muscle disease -surgery plans -take medicines that treat or prevent blood clots -thyroid problems -an unusual or allergic reaction to botulinum toxin, albumin, other medicines, foods, dyes, or preservatives -pregnant or trying to get pregnant -breast-feeding How should I use this medicine? This medicine is for injection into a muscle. It is given by a health care professional in a hospital or clinic setting. Talk to your pediatrician regarding the use of this medicine in children. While this drug may be prescribed for children as young as 76 years old for selected conditions, precautions do apply. Overdosage: If you think you have taken too much of this medicine contact a poison control center or emergency room at once. NOTE: This medicine is only for you. Do not share this medicine with others. What if I miss a dose? This does not apply. What may interact with this  medicine? -aminoglycoside antibiotics like gentamicin, neomycin, tobramycin -muscle relaxants -other botulinum toxin injections This list may not describe all possible interactions. Give your health care provider a list of all the medicines, herbs, non-prescription drugs, or dietary supplements you use. Also tell them if you smoke, drink alcohol, or use illegal drugs. Some items may interact with your medicine. What should I watch for while using this medicine? Visit your doctor for regular check ups. This medicine will cause weakness in the muscle where it is injected. Tell your doctor if you feel unusually weak in other muscles. Get medical help right away if you have problems with breathing, swallowing, or talking. This medicine might make your eyelids droop or make you see blurry or double. If you have weak muscles or trouble seeing do not drive a car, use machinery, or do other dangerous activities. This medicine contains albumin from human blood. It may be possible to pass an infection in this medicine, but no cases have been reported. Talk to your doctor about the risks and benefits of this medicine. If your activities have been limited by your condition, go back to your regular routine slowly after treatment with this medicine. What side effects may I notice from receiving this medicine? Side effects that you should report to your doctor or health care professional as soon as possible: -allergic reactions like skin rash, itching or hives, swelling of the face, lips, or tongue -breathing problems -changes in vision -chest pain or tightness -eye irritation, pain -fast, irregular heartbeat -infection -numbness -speech problems -swallowing problems -unusual weakness Side effects that usually do not require medical attention (report to your doctor or health care professional if they continue  or are bothersome): -bruising or pain at site where injected -drooping eyelid -dry eyes or  mouth -headache -muscles aches, pains -sensitivity to light -tearing This list may not describe all possible side effects. Call your doctor for medical advice about side effects. You may report side effects to FDA at 1-800-FDA-1088. Where should I keep my medicine? This drug is given in a hospital or clinic and will not be stored at home. NOTE: This sheet is a summary. It may not cover all possible information. If you have questions about this medicine, talk to your doctor, pharmacist, or health care provider.    2016, Elsevier/Gold Standard. (2014-11-07 15:43:53)

## 2015-08-15 NOTE — Therapy (Signed)
[facility] [address] [place], [place], [address] Phone: [phone]   Fax:  [phone]  Physical Therapy Treatment  Patient Details  Name: Ashley [name] MRN: [id number] Date of Birth: 1976-05-01 No Data Recorded  Encounter Date: 08/14/2015      PT End of Session - 08/15/15 1208    Visit Number 14   Number of Visits 17   Date for PT Re-Evaluation 08/28/15   Authorization Type BCBS. Per Laverda Page, pt gets 30 visits for PT and 30 for OT.   Authorization - Visit Number 14   Authorization - Number of Visits 30   PT Start Time 8889   PT Stop Time 1144   PT Time Calculation (min) 41 min   Equipment Utilized During Treatment Gait belt   Activity Tolerance Patient tolerated treatment well   Behavior During Therapy [facility] for tasks assessed/performed      Past Medical History  Diagnosis Date  . Anxiety   . ICH (intracerebral hemorrhage) [facility])     Past Surgical History  Procedure Laterality Date  . Dilation and curettage of uterus      There were no vitals filed for this visit.  Visit Diagnosis:  Abnormality of gait  Right leg weakness      Subjective Assessment - 08/14/15 1105    Subjective Pt reported two other moms tried to help pt up during the Saturday soccer game, and hurt her R UE (pulled on her UE). Pt reported she was unable to lie on R UE or move arm, however, it feels a little better after OT today. Pt denied falls and reported she walked 2 miles during trick-or-treating with her kids, she held husband's hand for balance.    Pertinent History Seizures, low BP   Patient Stated Goals "Be as close to normal as much as possible and to go back to running" Get back to taking care of my kids, 40 weeks old and 71 and 40 years old   Currently in Pain? No/denies                    Therex:     [facility] Adult PT Treatment/Exercise - 08/14/15 1109    Lumbar Exercises: Machines for Strengthening    Leg Press 70# B LE leg press with cues to fully extend R knee 2x10. R LE only at 35 pounds with cues to improve eccentric control, 2x10. Pt required rest breaks after each set 2/2 fatigue.    Standing in // bars with supervision and one UE support: pt performed B heel raises to improve gastroc/soleus power and toe push off during terminal stance, x20 reps in 19 seconds. Heel raises, R LE only: x10 reps with R knees extended to improve gastroc strength and x10 reps with R knee flexed to approx. 15 degrees to improve soleus strength. Tactile and VC's to improve technique and keep R knee flexed during soleus strength training.  Gait: In // bars with 0-1 UE support and min guard-S for safety: cues for technique and to improve stride length and upright posture. Pt amb. Backwards, 6x7'. Treadmill with B UE support and min guard to S for safety, R AFO not donned. 3 minutes on gait training program at 1.64mph to improve stride length and R LE stance time. Then 3 minutes with supervision-min guard provided by PT tech and assist from PT to improve R knee flexion, toe off and ankle DF. Pt required seated rest breaks after each bout  of amb. Pt noted to experience improved stride length and heel strike and wt. Shifting onto R LE with cues.              PT Short Term Goals - 07/26/15 1451    PT SHORT TERM GOAL #1   Title Pt will be IND in HEP to improve strength, balance, and endurance. Target date: 07/27/15.   Status Partially Met   PT SHORT TERM GOAL #2   Title Pt will improve gait speed to >/=1.65ft/sec to decrease falls risk. Target date: 07/27/15.   Status Achieved   PT SHORT TERM GOAL #3   Title Pt will ambulate 300' with LRAD, over even terrain, at MOD I level to improve functional mobility. Target date: 07/27/15.   Status Achieved   PT SHORT TERM GOAL #4   Title Pt will perform TUG in </=13.5 seconds with LRAD to decr. falls risk. Target date: 07/27/15.   Status Partially Met   PT SHORT TERM  GOAL #5   Title Perform BERG and write STG and LTG as appropriate. Target date: 07/27/15.   Status Achieved   PT SHORT TERM GOAL #6   Title Pt will improve BERG to >/=51/56 to decr. falls risk. Target date: 07/27/15.   Status Partially Met           PT Long Term Goals - 07/20/15 1125    PT LONG TERM GOAL #1   Title Pt will ambulate 1000' over even/uneven terrain, with LRAD, at MOD I level to improve functional mobiliity. Target date: 08/24/15.   Status On-going   PT LONG TERM GOAL #2   Title Pt will amb. 300' over even terrain without an AD, IND, to safely amb. in home. Target date: 08/24/15.   Status On-going   PT LONG TERM GOAL #3   Title Pt will improve gait speed to >/=2.59ft/sec, with LRAD, to amb. safely in the community. Target date: 08/24/15.   Status On-going   PT LONG TERM GOAL #4   Title Pt will verbalize plans to join a fitness center upon d/c from PT to continue to maintain strength and endurance gains made during PT. Target date: 08/24/15.   Status On-going   PT LONG TERM GOAL #5   Title Pt improve BERG score to >/=55/56 to decr. falls risk. Target date: 08/24/15.   Status On-going               Plan - 08/15/15 1209    Clinical Impression Statement Pt demonstrated progress, as she was able to amb. on treadmill without R AFO donned. Pt still requires cues to improve R LE wt. shifting, especially with AFO not donned. Pt may benefit from body weight support treadmill training. Continue with POC.   Pt will benefit from skilled therapeutic intervention in order to improve on the following deficits Abnormal gait;Decreased endurance;Impaired sensation;Decreased knowledge of precautions;Decreased activity tolerance;Decreased knowledge of use of DME;Decreased strength;Impaired UE functional use;Impaired tone;Decreased balance;Decreased mobility;Decreased cognition;Decreased range of motion;Decreased safety awareness;Decreased coordination;Impaired flexibility   Rehab  Potential Good   Clinical Impairments Affecting Rehab Potential Seizures which may limit intensity of PT   PT Frequency 2x / week   PT Duration 8 weeks   PT Treatment/Interventions ADLs/Self Care Home Management;Neuromuscular re-education;Cognitive remediation;Biofeedback;DME Instruction;Gait training;Stair training;Canalith Repostioning;Patient/family education;Orthotic Fit/Training;Balance training;Therapeutic exercise;Manual techniques;Therapeutic activities;Vestibular   PT Next Visit Plan Body weight support treadmill training.   PT Home Exercise Plan Stretching/strength/balance HEP   Consulted and Agree with Plan of Care Patient  Problem List Patient Active Problem List   Diagnosis Date Noted  . Nontraumatic cortical hemorrhage of cerebral hemisphere ([place])   . Seizure ([place])   . Seizures ([place])   . Adjustment disorder with depressed mood   . Contracture of muscle ankle and foot 06/11/2015  . Hemiplga fol ntrm intcrbl hemor aff right dominant side 06/06/2015  . Left-sided intracerebral hemorrhage ([place]) 06/04/2015  . Seizure disorder as sequela of cerebrovascular accident ([place])   . Seizure disorder ([place]) 06/03/2015  . Sepsis ([place]) 06/03/2015  . UTI (urinary tract infection) 06/03/2015  . Hypotension 06/02/2015  . Right spastic hemiparesis ([place]) 05/28/2015  . 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 History of anxiety disorder 05/28/2015  . ICH (intracerebral hemorrhage) ([place])   . Seizure disorder, nonconvulsive, with status epilepticus ([place])   . Cerebral venous thrombosis of cortical vein   . Cytotoxic cerebral edema ([place])   . IVH (intraventricular hemorrhage) ([place])   . Term pregnancy 05/07/2015  . Spontaneous vaginal delivery 05/07/2015    Crestina Strike L 08/15/2015, 12:12 PM  [facility] [address] [place], [place], [address] Phone: [phone]   Fax:   [phone]  Name: Akya Fiorello MRN: [id number] Date of Birth: May 09, 1976    Geoffry Paradise, PT,DPT 08/15/2015 12:12 PM Phone: [phone] Fax: [phone]

## 2015-08-16 ENCOUNTER — Ambulatory Visit: Payer: BLUE CROSS/BLUE SHIELD

## 2015-08-16 ENCOUNTER — Ambulatory Visit: Payer: BLUE CROSS/BLUE SHIELD | Admitting: Occupational Therapy

## 2015-08-16 ENCOUNTER — Telehealth: Payer: Self-pay | Admitting: Neurology

## 2015-08-16 DIAGNOSIS — IMO0002 Reserved for concepts with insufficient information to code with codable children: Secondary | ICD-10-CM

## 2015-08-16 DIAGNOSIS — R269 Unspecified abnormalities of gait and mobility: Secondary | ICD-10-CM

## 2015-08-16 DIAGNOSIS — R41841 Cognitive communication deficit: Secondary | ICD-10-CM | POA: Diagnosis not present

## 2015-08-16 DIAGNOSIS — R278 Other lack of coordination: Secondary | ICD-10-CM

## 2015-08-16 DIAGNOSIS — R29898 Other symptoms and signs involving the musculoskeletal system: Secondary | ICD-10-CM

## 2015-08-16 DIAGNOSIS — G8111 Spastic hemiplegia affecting right dominant side: Secondary | ICD-10-CM

## 2015-08-16 MED ORDER — LEVETIRACETAM 750 MG PO TABS: 1500.0000 mg | ORAL_TABLET | Freq: Two times a day (BID) | ORAL | Status: DC

## 2015-08-16 NOTE — Therapy (Signed)
[facility] [address] [place], [place], [address] Phone: [phone]   Fax:  [phone]  Occupational Therapy Treatment  Patient Details  Name: Ashley [name] MRN: [id number] Date of Birth: March 27, 1976 No Data Recorded  Encounter Date: 08/16/2015      OT End of Session - 08/16/15 1303    Visit Number 15   Number of Visits 17   Date for OT Re-Evaluation 08/29/15   Authorization Type BCBS   Authorization Time Period 75 visit OT   Authorization - Visit Number 15   Authorization - Number of Visits 30   OT Start Time 0933   OT Stop Time 1015   OT Time Calculation (min) 42 min   Activity Tolerance Patient tolerated treatment well   Behavior During Therapy [facility] for tasks assessed/performed      Past Medical History  Diagnosis Date  . Anxiety   . ICH (intracerebral hemorrhage) [facility])     Past Surgical History  Procedure Laterality Date  . Dilation and curettage of uterus      There were no vitals filed for this visit.  Visit Diagnosis:  Weakness due to cerebrovascular accident  Right spastic hemiparesis (HCC)  Coordination impairment      Subjective Assessment - 08/16/15 1253    Pertinent History see epic snapshot; pt with SAH 2 weeks after deliverying her 3rd child   Patient Stated Goals to be able to use my R arm normally   Currently in Pain? Yes   Pain Score 3    Pain Location Shoulder   Pain Orientation Right   Pain Descriptors / Indicators Aching   Pain Type Acute pain   Pain Onset More than a month ago   Pain Frequency Intermittent   Aggravating Factors  certain positions   Pain Relieving Factors reposistioning        Treatment: Pt reports MD injected shoulder with cortizone on Tuesday. Supine closed chain shoulder flexion and chest press with therapist facilitating right scapula, seated edge of mat weightbearing through bilateral UE's  On yoga blocks, min facilitation. Shoulder flexion  closed chain with PVC pipe frame, min facilitation for RUE. Standing AA/ROM shoulder flexion then circumduction, with UE Ranger, min facilitation. Pt became lightheaded, and sat down therpist provided with water and peanut butter crackers as pt reports she did not eat breakfast today.(BP was 96/64, which pt reports is normal for her). Pt reported she felt a little better at end of session. She rested until PT appt.                        OT Short Term Goals - 07/26/15 1333    OT SHORT TERM GOAL #1   Title I with HEP.   Status Achieved   OT SHORT TERM GOAL #2   Title Pt will demonstrate impoved fine motor coordinationas evidenced by decreasing RUE 9 hole peg test score to 35 secs or less.   Baseline 28.44 secs   Status Achieved   OT SHORT TERM GOAL #3   Title Pt will perform all basic ADLs at a supervision level.   Status Achieved   OT SHORT TERM GOAL #4   Title Pt will use RUE as an active assist for ADLS/IADLS at least 50 % of the time.   Status Achieved   OT SHORT TERM GOAL #5   Title Pt will perform basic home management/ cooking at min A level.   Status Achieved  OT Long Term Goals - 07/12/15 1149    OT LONG TERM GOAL #1   Title Pt will perfom all basic ADLS modidied independently.   Baseline due 08/27/15   Time 8   Period Weeks   Status On-going   OT LONG TERM GOAL #2   Title Pt will perform basic home management/ cooking at a supervision level demonstrating good safety awareness.   Time 8   Period Weeks   Status On-going   OT LONG TERM GOAL #3   Title Pt will demonstrate ability to retrieve a 3 lbs weight at 120 x 5 reps without dropping with RUE   Time 8   Period Weeks   Status On-going   OT LONG TERM GOAL #4   Title Pt will resume use of RUE as dominant hand at least 75% of the time for ADLs/ IADLs.   Time 8   Period Weeks   Status On-going   OT LONG TERM GOAL #5   Title Pt will demonstrate ability to perform a physical and  cognitive task simultaneously with 80% or better accuracy.   Time 8   Period Weeks   Status On-going   OT LONG TERM GOAL #6   Title Pt will demonstrate improved fine motor coordination as evidenced by decreasing RUE 9 hole peg test score to 30 secs or less.   Time 8   Period Weeks   Status On-going               Plan - 08/16/15 1254    Clinical Impression Statement Pt is progressing towards goals limited by right shoulder pain. Pt reports Dr. Dianna Limbo injected her shoulder with cortizone on 11/01/ 16   Pt will benefit from skilled therapeutic intervention in order to improve on the following deficits (Retired) Abnormal gait;Decreased coordination;Decreased range of motion;Difficulty walking;Impaired flexibility;Decreased endurance;Decreased safety awareness;Increased edema;Impaired tone;Decreased knowledge of precautions;Decreased activity tolerance;Decreased balance;Decreased knowledge of use of DME;Pain;Impaired UE functional use;Impaired vision/preception;Decreased cognition;Decreased mobility;Decreased strength   Rehab Potential Good   OT Frequency 2x / week   OT Duration 8 weeks   OT Treatment/Interventions Self-care/ADL training;Therapeutic exercise;Patient/family education;Balance training;Ultrasound;Neuromuscular education;Manual Therapy;Splinting;Therapeutic exercises;Energy conservation;Parrafin;DME and/or AE instruction;Therapeutic activities;Cognitive remediation/compensation;Gait Training;Fluidtherapy;Electrical Stimulation;Moist Heat;Contrast Bath;Passive range of motion;Visual/perceptual remediation/compensation   Plan review HEP, ulrasound, NMR   OT Home Exercise Plan issued: ball ex in supine, trunk for A-p tilts with trunk rotation(07/05/15)   Consulted and Agree with Plan of Care Patient        Problem List Patient Active Problem List   Diagnosis Date Noted  . Nontraumatic cortical hemorrhage of cerebral hemisphere ([place])   . Seizure ([place])   . Seizures ([place])    . Adjustment disorder with depressed mood   . Contracture of muscle ankle and foot 06/11/2015  . Hemiplga fol ntrm intcrbl hemor aff right dominant side 06/06/2015  . Left-sided intracerebral hemorrhage ([place]) 06/04/2015  . Seizure disorder as sequela of cerebrovascular accident ([place])   . Seizure disorder ([place]) 06/03/2015  . Sepsis ([place]) 06/03/2015  . UTI (urinary tract infection) 06/03/2015  . Hypotension 06/02/2015  . Right spastic hemiparesis ([place]) 05/28/2015  . 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 History of anxiety disorder 05/28/2015  . ICH (intracerebral hemorrhage) ([place])   . Seizure disorder, nonconvulsive, with status epilepticus ([place])   . Cerebral venous thrombosis of cortical vein   . Cytotoxic cerebral edema ([place])   . IVH (intraventricular hemorrhage) ([place])   . Term pregnancy 05/07/2015  . Spontaneous vaginal delivery 05/07/2015  RINE,KATHRYN 08/16/2015, 1:13 PM Theone Murdoch, OTR/L [phone] Phone: [phone] 1:13 PM 08/16/2015 [facility] [address] [place], [place], [address] Phone: [phone]   Fax:  [phone]  Name: Ashley [name] MRN: [id number] Date of Birth: 03-06-1976

## 2015-08-16 NOTE — Telephone Encounter (Signed)
The patient is in a need for a refill of the Keppra 750 MG to be sent to the [facility] on [address]. The best number to contact the patient is [phone]

## 2015-08-16 NOTE — Therapy (Signed)
[facility] [address] [place], [place], [address] Phone: [phone]   Fax:  [phone]  Speech Language Pathology Treatment  Patient Details  Name: Ashley [name] MRN: [id number] Date of Birth: June 19, 1976 No Data Recorded  Encounter Date: 08/16/2015      End of Session - 08/16/15 1233    Visit Number 15   Number of Visits 24   Date for SLP Re-Evaluation 09/12/15   Authorization - Visit Number 14   Authorization - Number of Visits 30   SLP Start Time 0258   SLP Stop Time  1215   SLP Time Calculation (min) 32 min   Activity Tolerance Patient tolerated treatment well      Past Medical History  Diagnosis Date  . Anxiety   . ICH (intracerebral hemorrhage) [facility])     Past Surgical History  Procedure Laterality Date  . Dilation and curettage of uterus      There were no vitals filed for this visit.  Visit Diagnosis: Cognitive communication deficit      Subjective Assessment - 08/16/15 1149    Subjective "I didn't really do it like you said." (divided attention homework)   Patient is accompained by: Family member               ADULT SLP TREATMENT - 08/16/15 1150    General Information   Behavior/Cognition Alert;Cooperative;Pleasant mood   Treatment Provided   Treatment provided Cognitive-Linquistic   Pain Assessment   Pain Assessment No/denies pain   Cognitive-Linquistic Treatment   Treatment focused on Cognition   Skilled Treatment Simple executive function task (planning 39 year-old's b-day party) with consistent extra time and rare min-mod A. Pt stated she may have used more alternating attention than divided attention.    Assessment / Recommendations / Plan   Plan Continue with current plan of care   Progression Toward Goals   Progression toward goals Progressing toward goals          SLP Education - 08/16/15 1232    Education provided Yes   Education Details executive function     Person(s) Educated Patient   Methods Explanation   Comprehension Verbalized understanding          SLP Short Term Goals - 08/09/15 1010    SLP SHORT TERM GOAL #1   Title pt will alternate attention between simple cognitive-linguistic tasks with 85% success and WFL switch time over 3 sessions   Period --  or 12 visits, for all STGs   Status Achieved   SLP SHORT TERM GOAL #2   Title pt will demo verbal safety awareness related to deficits   Status Achieved   SLP SHORT TERM GOAL #3   Title pt will complete mod complex time, money, sequencing, calendar problems 95% with modified independence (self-correction)   Status Achieved   SLP SHORT TERM GOAL #4   Title pt will demo divided attention in two simple tasks 80% of the time with rare min A   Time 1   Period Weeks   Status On-going          SLP Long Term Goals - 08/16/15 1236    SLP LONG TERM GOAL #1   Title pt will divide attention between one simple task and one mod complex cognitive linguistic task 90% each task   Time 2   Period Weeks   Status Revised   SLP LONG TERM GOAL #2   Title pt will demo anticipatory awareness by using  compensatory measures for memory and/or decr'd attention x1 in a session over 4 sessions   Time 2   Period Weeks   Status Achieved   SLP LONG TERM GOAL #3   Title pt will demo executive function in a simple task with rare min A   Time 2   Period Weeks   Status On-going          Plan - 08/16/15 1234    Clinical Impression Statement Pt with executive function deficits revealing themselves with slower than expected responses in these tasks today. Skilled ST remains necessary for maximizing abilities for possible return to work.   Speech Therapy Frequency 2x / week   Duration 2 weeks   Treatment/Interventions Cognitive reorganization;SLP instruction and feedback;Compensatory strategies;Internal/external aids;Environmental controls;Patient/family education;Functional tasks;Cueing  hierarchy;Language facilitation   Potential to Achieve Goals Good   Potential Considerations Ability to learn/carryover information;Severity of impairments        Problem List Patient Active Problem List   Diagnosis Date Noted  . Nontraumatic cortical hemorrhage of cerebral hemisphere ([place])   . Seizure ([place])   . Seizures ([place])   . Adjustment disorder with depressed mood   . Contracture of muscle ankle and foot 06/11/2015  . Hemiplga fol ntrm intcrbl hemor aff right dominant side 06/06/2015  . Left-sided intracerebral hemorrhage ([place]) 06/04/2015  . Seizure disorder as sequela of cerebrovascular accident ([place])   . Seizure disorder ([place]) 06/03/2015  . Sepsis ([place]) 06/03/2015  . UTI (urinary tract infection) 06/03/2015  . Hypotension 06/02/2015  . Right spastic hemiparesis ([place]) 05/28/2015  . 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 History of anxiety disorder 05/28/2015  . ICH (intracerebral hemorrhage) ([place])   . Seizure disorder, nonconvulsive, with status epilepticus ([place])   . Cerebral venous thrombosis of cortical vein   . Cytotoxic cerebral edema ([place])   . IVH (intraventricular hemorrhage) ([place])   . Term pregnancy 05/07/2015  . Spontaneous vaginal delivery 05/07/2015    [facility] , [place], [place]  08/16/2015, 12:38 PM  [facility] [address] [place] [address] [place], [place], [address] Phone: [phone]   Fax:  [phone]   Name: Tocara Mennen MRN: [id number] Date of Birth: 26-Dec-1975

## 2015-08-16 NOTE — Therapy (Signed)
[facility] [address] [place], [place], [address] Phone: [phone]   Fax:  [phone]  Physical Therapy Treatment  Patient Details  Name: Ashley [name] MRN: [id number] Date of Birth: 1975-12-05 No Data Recorded  Encounter Date: 08/16/2015      PT End of Session - 08/16/15 1225    Visit Number 15   Number of Visits 17   Date for PT Re-Evaluation 08/28/15   Authorization Type BCBS. Per Laverda Page, pt gets 30 visits for PT and 30 for OT.   Authorization - Visit Number 15   Authorization - Number of Visits 30   PT Start Time 1100   PT Stop Time 1142   PT Time Calculation (min) 42 min   Activity Tolerance Patient tolerated treatment well   Behavior During Therapy WFL for tasks assessed/performed      Past Medical History  Diagnosis Date  . Anxiety   . ICH (intracerebral hemorrhage) [facility])     Past Surgical History  Procedure Laterality Date  . Dilation and curettage of uterus      There were no vitals filed for this visit.  Visit Diagnosis:  Right leg weakness  Abnormality of gait      Subjective Assessment - 08/16/15 1102    Subjective Pt reported she felt lightheadedness during OT and required a 45 minute rest break prior to PT and ate crackers and had water, and felt better. Pt reported MD told pt that she didn't need to wear AFO any longer, and pt fell that night (Tuesday) going up stairs but she caught herself and denied injuries.  Pt reported her appetite is still decreased.    Pertinent History Seizures, low BP   Patient Stated Goals "Be as close to normal as much as possible and to go back to running" Get back to taking care of my kids, 76 weeks old and 77 and 1 years old   Currently in Pain? No/denies           Therex: Pt performed in Supine with cues for technique. Bridges with B LEs placed on red theraband while performing TrA contractions with 2 second holds, 3x10 reps. Hamstring  curls with B LEs on red theraband, lifting ball in air with 2 seconds hold. 3x10 reps R LE bridging with min-mod A from PT or pt used L LE in extension to assist R LE, x10 reps.   Neuro re-ed: Sitting on physioball with min guard to min A to maintain balance: Cues for technique and upright posture. To improve coordination, balance, and core strength: Pt performed the following activities: B LEs marches with TrA activiation, x10/LE B UEs alternating shoulder flexion to 90 degrees with TrA activation. X10/UE B LE alternating marches while performing shoulder flex. To 90 degrees with contralat. UE.   Ball toss x20 to self Ant/post pelvic tilts x10 in each direction.                [facility] Adult PT Treatment/Exercise - 08/16/15 1134    Ambulation/Gait   Ambulation/Gait Yes   Ambulation/Gait Assistance 5: Supervision;4: Min guard   Ambulation/Gait Assistance Details Pt ambulated over even terrain without an AD, with R AFO donned. Cues to improve B heel strike, stride length, especially while traversing corners. PT provided tactile cues and min A to pt's L hip to encourage improved R LE wt. shifting.   Ambulation Distance (Feet) 345 Feet  75'   Assistive device None   Gait Pattern Decreased  stance time - right;Decreased hip/knee flexion - right;Decreased dorsiflexion - right;Decreased weight shift to right;Decreased arm swing - right;Trunk rotated posteriorly on right   Ambulation Surface Level;Indoor                PT Education - 08/16/15 1225    Education provided Yes   Education Details PT encouraged pt to use AFO when amb. longer distance, and when fatigued to improve safety during amb.  PT encouraged pt to notify MD of lightheadedness and decr. appetite if sx's persist.    Person(s) Educated Patient   Methods Explanation   Comprehension Verbalized understanding          PT Short Term Goals - 07/26/15 1451    PT SHORT TERM GOAL #1   Title Pt will be IND in HEP to  improve strength, balance, and endurance. Target date: 07/27/15.   Status Partially Met   PT SHORT TERM GOAL #2   Title Pt will improve gait speed to >/=1.82ft/sec to decrease falls risk. Target date: 07/27/15.   Status Achieved   PT SHORT TERM GOAL #3   Title Pt will ambulate 300' with LRAD, over even terrain, at MOD I level to improve functional mobility. Target date: 07/27/15.   Status Achieved   PT SHORT TERM GOAL #4   Title Pt will perform TUG in </=13.5 seconds with LRAD to decr. falls risk. Target date: 07/27/15.   Status Partially Met   PT SHORT TERM GOAL #5   Title Perform BERG and write STG and LTG as appropriate. Target date: 07/27/15.   Status Achieved   PT SHORT TERM GOAL #6   Title Pt will improve BERG to >/=51/56 to decr. falls risk. Target date: 07/27/15.   Status Partially Met           PT Long Term Goals - 07/20/15 1125    PT LONG TERM GOAL #1   Title Pt will ambulate 1000' over even/uneven terrain, with LRAD, at MOD I level to improve functional mobiliity. Target date: 08/24/15.   Status On-going   PT LONG TERM GOAL #2   Title Pt will amb. 300' over even terrain without an AD, IND, to safely amb. in home. Target date: 08/24/15.   Status On-going   PT LONG TERM GOAL #3   Title Pt will improve gait speed to >/=2.65ft/sec, with LRAD, to amb. safely in the community. Target date: 08/24/15.   Status On-going   PT LONG TERM GOAL #4   Title Pt will verbalize plans to join a fitness center upon d/c from PT to continue to maintain strength and endurance gains made during PT. Target date: 08/24/15.   Status On-going   PT LONG TERM GOAL #5   Title Pt improve BERG score to >/=55/56 to decr. falls risk. Target date: 08/24/15.   Status On-going               Plan - 08/16/15 1225    Clinical Impression Statement Pt limited today by lightheadedness, so PT had pt begin session by performing exercises in supine, progressed to seated exercises and then gait training.  Pt exhibited decr. trunk stability during physioball exercises 2/2 decr. core strenght, which iimproved with TrA contraction. PT will assess goals next week and plans to renew pt for 2x/week for 4 weeks. Continue with POC.   Pt will benefit from skilled therapeutic intervention in order to improve on the following deficits Abnormal gait;Decreased endurance;Impaired sensation;Decreased knowledge of precautions;Decreased activity tolerance;Decreased knowledge of use  of DME;Decreased strength;Impaired UE functional use;Impaired tone;Decreased balance;Decreased mobility;Decreased cognition;Decreased range of motion;Decreased safety awareness;Decreased coordination;Impaired flexibility   Rehab Potential Good   Clinical Impairments Affecting Rehab Potential Seizures which may limit intensity of PT   PT Frequency 2x / week   PT Duration 8 weeks   PT Treatment/Interventions ADLs/Self Care Home Management;Neuromuscular re-education;Cognitive remediation;Biofeedback;DME Instruction;Gait training;Stair training;Canalith Repostioning;Patient/family education;Orthotic Fit/Training;Balance training;Therapeutic exercise;Manual techniques;Therapeutic activities;Vestibular   PT Next Visit Plan Body weight support treadmill training.   PT Home Exercise Plan Stretching/strength/balance HEP   Consulted and Agree with Plan of Care Patient        Problem List Patient Active Problem List   Diagnosis Date Noted  . Nontraumatic cortical hemorrhage of cerebral hemisphere ([place])   . Seizure ([place])   . Seizures ([place])   . Adjustment disorder with depressed mood   . Contracture of muscle ankle and foot 06/11/2015  . Hemiplga fol ntrm intcrbl hemor aff right dominant side 06/06/2015  . Left-sided intracerebral hemorrhage ([place]) 06/04/2015  . Seizure disorder as sequela of cerebrovascular accident ([place])   . Seizure disorder ([place]) 06/03/2015  . Sepsis ([place]) 06/03/2015  . UTI (urinary tract infection) 06/03/2015  .  Hypotension 06/02/2015  . Right spastic hemiparesis ([place]) 05/28/2015  . 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 History of anxiety disorder 05/28/2015  . ICH (intracerebral hemorrhage) ([place])   . Seizure disorder, nonconvulsive, with status epilepticus ([place])   . Cerebral venous thrombosis of cortical vein   . Cytotoxic cerebral edema ([place])   . IVH (intraventricular hemorrhage) ([place])   . Term pregnancy 05/07/2015  . Spontaneous vaginal delivery 05/07/2015    Shaelin Lalley L 08/16/2015, 12:36 PM  [facility] [address] [place], [place], [address] Phone: [phone]   Fax:  [phone]  Name: Ashley [name] MRN: [id number] Date of Birth: September 08, 1976   Geoffry Paradise, PT,DPT 08/16/2015 12:36 PM Phone: [phone] Fax: [phone]

## 2015-08-16 NOTE — Patient Instructions (Signed)
  Please complete the assigned speech therapy homework and return it to your next session. (complete birthday party details)

## 2015-08-16 NOTE — Telephone Encounter (Signed)
Patient has a hospital follow up appt scheduled.  Rx has been sent.  Receipt confirmed by pharmacy.  I called back to advise.  Got no answer.  Left message.

## 2015-08-20 ENCOUNTER — Telehealth: Payer: Self-pay | Admitting: Physical Medicine & Rehabilitation

## 2015-08-20 NOTE — Telephone Encounter (Signed)
Patient has questions about her medication and she also needed to talk about her disability.  If someone could call her about these.

## 2015-08-20 NOTE — Telephone Encounter (Signed)
[email or web address]

## 2015-08-21 ENCOUNTER — Ambulatory Visit: Payer: BLUE CROSS/BLUE SHIELD | Admitting: Occupational Therapy

## 2015-08-21 ENCOUNTER — Ambulatory Visit: Payer: BLUE CROSS/BLUE SHIELD

## 2015-08-21 ENCOUNTER — Encounter: Payer: Self-pay | Admitting: Neurology

## 2015-08-21 ENCOUNTER — Ambulatory Visit (INDEPENDENT_AMBULATORY_CARE_PROVIDER_SITE_OTHER): Payer: BLUE CROSS/BLUE SHIELD | Admitting: Neurology

## 2015-08-21 ENCOUNTER — Ambulatory Visit: Payer: BLUE CROSS/BLUE SHIELD | Admitting: Physical Therapy

## 2015-08-21 VITALS — BP 99/69 | HR 77 | Ht 63.0 in | Wt 134.0 lb

## 2015-08-21 DIAGNOSIS — R41841 Cognitive communication deficit: Secondary | ICD-10-CM | POA: Diagnosis not present

## 2015-08-21 DIAGNOSIS — R278 Other lack of coordination: Secondary | ICD-10-CM

## 2015-08-21 DIAGNOSIS — G8111 Spastic hemiplegia affecting right dominant side: Secondary | ICD-10-CM

## 2015-08-21 DIAGNOSIS — R269 Unspecified abnormalities of gait and mobility: Secondary | ICD-10-CM

## 2015-08-21 DIAGNOSIS — IMO0002 Reserved for concepts with insufficient information to code with codable children: Secondary | ICD-10-CM

## 2015-08-21 DIAGNOSIS — G40109 Localization-related (focal) (partial) symptomatic epilepsy and epileptic syndromes with simple partial seizures, not intractable, without status epilepticus: Secondary | ICD-10-CM | POA: Diagnosis not present

## 2015-08-21 MED ORDER — LEVETIRACETAM 750 MG PO TABS: 1500.0000 mg | ORAL_TABLET | Freq: Two times a day (BID) | ORAL | Status: DC

## 2015-08-21 NOTE — Telephone Encounter (Signed)
We'll need to do an ultrasound guided glenohumeral injection which is a little deeper than the previous injection without ultrasound

## 2015-08-21 NOTE — Telephone Encounter (Signed)
Spoke with husband and scheduled patient for injection on 08/24/15 with Dr. Letta Pate.

## 2015-08-21 NOTE — Therapy (Signed)
[facility] [address] [place], [place], [address] Phone: [phone]   Fax:  [phone]  Speech Language Pathology Treatment  Patient Details  Name: Ashley [name] MRN: [id number] Date of Birth: 1976/01/15 No Data Recorded  Encounter Date: 08/21/2015      End of Session - 08/21/15 1427    Visit Number 16   Number of Visits 24   Date for SLP Re-Evaluation 09/12/15   Authorization - Visit Number 28   SLP Start Time 0933   SLP Stop Time  2355   SLP Time Calculation (min) 42 min      Past Medical History  Diagnosis Date  . Anxiety   . ICH (intracerebral hemorrhage) ([place])   . Stroke [facility])     Past Surgical History  Procedure Laterality Date  . Dilation and curettage of uterus      There were no vitals filed for this visit.  Visit Diagnosis: Cognitive communication deficit             ADULT SLP TREATMENT - 08/21/15 0941    General Information   Behavior/Cognition Alert;Cooperative;Pleasant mood   Treatment Provided   Treatment provided Cognitive-Linquistic   Pain Assessment   Pain Assessment No/denies pain   Cognitive-Linquistic Treatment   Treatment focused on Cognition   Skilled Treatment Reasoned with pt to talk with PT re: "I never thought about being restricted" re: flying, walking on a cruise. Pt copmleted mod difficult executive function task with extra time but good planning and logical progression of ideas in planning a long weekend to [facility]. Pt remarked that her divided attention may not have been good prior to the CVA.    Assessment / Recommendations / Plan   Plan Continue with current plan of care   Progression Toward Goals   Progression toward goals Progressing toward goals            SLP Short Term Goals - 08/09/15 1010    SLP SHORT TERM GOAL #1   Title pt will alternate attention between simple cognitive-linguistic tasks with 85% success and WFL switch time  over 3 sessions   Period --  or 12 visits, for all STGs   Status Achieved   SLP SHORT TERM GOAL #2   Title pt will demo verbal safety awareness related to deficits   Status Achieved   SLP SHORT TERM GOAL #3   Title pt will complete mod complex time, money, sequencing, calendar problems 95% with modified independence (self-correction)   Status Achieved   SLP SHORT TERM GOAL #4   Title pt will demo divided attention in two simple tasks 80% of the time with rare min A   Time 1   Period Weeks   Status On-going          SLP Long Term Goals - 08/21/15 1432    SLP LONG TERM GOAL #1   Title pt will divide attention between one simple task and one mod complex cognitive linguistic task 90% each task   Time 2   Period Weeks   Status Revised   SLP LONG TERM GOAL #2   Title pt will demo anticipatory awareness by using compensatory measures for memory and/or decr'd attention x1 in a session over 4 sessions   Time 2   Period Weeks   Status Achieved   SLP LONG TERM GOAL #3   Title pt will demo executive function in a simple task with rare min A   Time 2  Period Weeks   Status Achieved          Plan - 08/21/15 1427    Clinical Impression Statement Extra time cont needed with executive function task, however with logical and appropriate procedure and outcomes. Pt remarked to SLP that her divided attention was not excellent prior to CVA. SLP considering decreasing to x1/week due to possible baseline re: attention, and SLP to consult pt about this.   Speech Therapy Frequency 2x / week   Duration 1 week   Treatment/Interventions Cognitive reorganization;SLP instruction and feedback;Compensatory strategies;Internal/external aids;Environmental controls;Patient/family education;Functional tasks;Cueing hierarchy;Language facilitation   Potential to Achieve Goals Good   Potential Considerations Ability to learn/carryover information;Severity of impairments        Problem List Patient  Active Problem List   Diagnosis Date Noted  . Symptomatic partial epilepsy with simple partial seizures ([place]) 08/21/2015  . Nontraumatic cortical hemorrhage of cerebral hemisphere ([place])   . Seizure ([place])   . Seizures ([place])   . Adjustment disorder with depressed mood   . Contracture of muscle ankle and foot 06/11/2015  . Hemiplga fol ntrm intcrbl hemor aff right dominant side 06/06/2015  . Left-sided intracerebral hemorrhage ([place]) 06/04/2015  . Seizure disorder as sequela of cerebrovascular accident ([place])   . Seizure disorder ([place]) 06/03/2015  . Sepsis ([place]) 06/03/2015  . UTI (urinary tract infection) 06/03/2015  . Hypotension 06/02/2015  . Right spastic hemiparesis ([place]) 05/28/2015  . 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 History of anxiety disorder 05/28/2015  . ICH (intracerebral hemorrhage) ([place])   . Seizure disorder, nonconvulsive, with status epilepticus ([place])   . Cerebral venous thrombosis of cortical vein   . Cytotoxic cerebral edema ([place])   . IVH (intraventricular hemorrhage) ([place])   . Term pregnancy 05/07/2015  . Spontaneous vaginal delivery 05/07/2015    [facility] , [place]  08/21/2015, 2:34 PM  [facility] [address] [place], [place], [address] Phone: [phone]   Fax:  [phone]   Name: Ashley [name] MRN: [id number] Date of Birth: December 14, 1975

## 2015-08-21 NOTE — Telephone Encounter (Signed)
Spoke with pt. Pt. States that she will be obtaining disability paperwork from [employer]. She states that her last visit she received a shoulder injection but, provided no relief. She states that she wanted to know what she could do next? Thanks

## 2015-08-21 NOTE — Patient Instructions (Signed)
Practice squeezing shoulder blades and shoulders back into the bed while laying on your back, 10 reps 2x day If your shoulder is painful apply a microwave heat pack for 8-10 mins wrapped in a towel, monitor your skin closely for redness due to impaired sensation to avoid burns, check after 5 mins, especially if laying on hot pack  Be aware of your shoulder position, try not to elevate shoulder , think about squeezing shoulder blades together to lower shoulder.

## 2015-08-21 NOTE — Progress Notes (Signed)
[facility] [address] [place]. [place] [address] [phone]       OFFICE FOLLOW-UP NOTE  [name] Date of Birth:  July 16, 1976 Medical Record Number:  [id number]   HPI: 5 year Caucasian lady seen today for first office follow-up visit for in-hospital admission for intracerebral hemorrhage in August 2016.Ashley [name] is an 39 y.o. female admitted on 05/21/15 for severe HA for several days and developed right hemiparesis and aphasia as well as right focal seizure. She was 2 weeks post partum at that time. She was found to have large l 8 x 4 x 5 cm eft parietal ICH with IVH, cerebral edema, obstructive hydrocephalus and SAH. MRA and MRV were unremarkable. Concern was for cerebral cortical vein thrombosis not seen on MRV, however, RVCS was also on differential. She was treated with 3% saline and was stabilized over time. She also had a seizure, EEG showed left PLEDs, was put on Keppra for seizure control. Hypercoagulable labs were normal She was then discharged to CIR after stabilization.where , patient had seizure episode again, Keppra was increased to 1500 twice a day. Since then patient had no more seizure activity. She is working with PT OT and speech, had made great progress. She is able to walk with cane and making sentences and was scheduled to discharge next Wednesday. However, as per therapist, her language seems getting worse over the last 2 days, with more stuttering, and words finding difficulties, especially when patient more anxious and eager to speak. No decline of any arm leg weakness, no seizure activity. Neurology was consulted for further evaluation on 06/22/15. Repeat MRI scan of the brain showed resolving subacute left parietal hematoma but also showed  Acute gyral swelling and T2 hyperintensity involving the left hippocampal formation, left insula, and operculum raising possible postictal effect. Patient has since done well without recurrent seizures. She is  presently living at home with her husband. She is getting outpatient therapy. She is walking with an AFO but still has right foot drop and right foot is quite stiff. She's also noticed increased depression and sleepiness. Her primary care physician and started on Prozac 20 mg a day and Abilify has recently been added. She is also having significant right shoulder pain and Dr. Alden Server has given her injections which have helped only partially. She states her short-term memory is poor but her speech has recovered completely. She feels her headaches are better after increasing Topamax to 50 mg twice daily. Headache frequency noise about twice a week.  ROS:   14 system review of systems is positive for  memory loss, headache, depression, anxiety, suicidal thoughts, gait difficulty, dragging the right leg and  All other systems negative  PMH:  Past Medical History  Diagnosis Date  . Anxiety   . ICH (intracerebral hemorrhage) ([place])   . Stroke [facility])     Social History:  Social History   Social History  . Marital Status: Married    Spouse Name: N/A  . Number of Children: N/A  . Years of Education: N/A   Occupational History  . Not on file.   Social History Main Topics  . Smoking status: Never Smoker   . Smokeless tobacco: Never Used  . Alcohol Use: 0.6 oz/week    1 Glasses of wine per week     Comment: casual   . Drug Use: No  . Sexual Activity: Not on file   Other Topics Concern  . Not on file   Social History  Narrative    Medications:   Current Outpatient Prescriptions on File Prior to Visit  Medication Sig Dispense Refill  . acetaminophen (TYLENOL) 500 MG tablet Take 1,000 mg by mouth every 6 (six) hours as needed for headache.    . ARIPiprazole (ABILIFY) 5 MG tablet Take 5 mg by mouth daily.    . diclofenac sodium (VOLTAREN) 1 % GEL Apply 2 g topically 4 (four) times daily. 3 Tube 1  . FLUoxetine (PROZAC) 20 MG capsule Take 20 mg by mouth daily.    Marland Kitchen topiramate (TOPAMAX) 50  MG tablet Take 1 tablet (50 mg total) by mouth 2 (two) times daily. 60 tablet 1   No current facility-administered medications on file prior to visit.    Allergies:  No Known Allergies  Physical Exam General: Pleasant young Caucasian lady, seated, in no evident distress Head: head normocephalic and atraumatic.  Neck: supple with no carotid or supraclavicular bruits Cardiovascular: regular rate and rhythm, no murmurs Musculoskeletal: no deformity Skin:  no rash/petichiae Vascular:  Normal pulses all extremities Filed Vitals:   08/21/15 1328  BP: 99/69  Pulse: 77   Neurologic Exam Mental Status: Awake and fully alert. Oriented to place and time. Recent and remote memory intact. Attention span, concentration and fund of knowledge appropriate. Mood and affect appropriate. Speech is fluent without paraphasias or word finding difficulty Cranial Nerves: Fundoscopic exam reveals sharp disc margins. Pupils equal, briskly reactive to light. Extraocular movements full without nystagmus. Visual fields full to confrontation. Hearing intact. Facial sensation intact. Face, tongue, palate moves normally and symmetrically. Right nasolabial fold asymmetry when she smiles. Motor: Normal bulk and tone. Normal strength in all tested extremity muscles. Diminished fine finger movements on the right. Orbits left or right upper extremity. Mild weakness of right hip flexors. Right foot drop with ankle dorsi flexor weakness Sensory.: intact to touch ,pinprick .position and vibratory sensation.  Coordination: Rapid alternating movements normal in all extremities. Finger-to-nose and heel-to-shin performed accurately bilaterally. Gait and Station: Arises from chair without difficulty. Stance is normal. Gait demonstrates dragging of the right foot with stiffness and right foot drop and wearing AFO Reflexes: 2+ and asymmetric and brisker on the right. Toes downgoing.   NIHSS  1 Modified Rankin  2   ASSESSMENT: 42  year Caucasian lady with postpartum large left parietal parenchymal/subarachnoid hemorrhage with seizures in august 2016 possibly from cortical vein thrombosis. MR venogram and workup for hypercoagulable disorders was negative    PLAN:  I had a long discussion with the patient and husband regarding her recent intracerebral hemorrhage, seizures, discussed and personally reviewed imaging studies and hospital results.I recommend she continue Keppra and the current dose of 1500 mg twice daily for seizures and Topamax 50 mg twice daily for headache. I advised her to continue follow-up with her primary physician for treatment of depression and if needed increase Prozac dose to 40 mg. She'll continue ongoing outpatient physical and occupational therapy. She was advised not to drive till next follow-up visit in 3 months. Repeat MRI scan of the brain with and without contrast to follow the previous medial temporal lobe abnormality seen on the prior MRI scan in September 2016. Greater than 50% of time during this 25 minute visit was spent on counseling,explanation of diagnosis, planning of further management, discussion with patient and family and coordination of care.I advised her to follow-up with her primary care physician for treatment for depression Return for follow-up in 3 months or call earlier if necessary.  Antony Contras,  MD  Note: This document was prepared with digital dictation and possible smart phrase technology. Any transcriptional errors that result from this process are unintentional

## 2015-08-21 NOTE — Therapy (Signed)
[facility] [address] [place], [place], [address] Phone: [phone]   Fax:  [phone]  Physical Therapy Treatment  Patient Details  Name: Ashley [name] MRN: [id number] Date of Birth: [birth year] No Data Recorded  Encounter Date: 08/21/2015      PT End of Session - 08/21/15 1535    Visit Number 16   Number of Visits 17   Date for PT Re-Evaluation 08/28/15   Authorization Type [employer]. Per Laverda Page, pt gets 30 visits for PT and 30 for OT.   Authorization - Visit Number 16   Authorization - Number of Visits 30   PT Start Time 2585   PT Stop Time 1105   PT Time Calculation (min) 47 min   Activity Tolerance Patient tolerated treatment well   Behavior During Therapy WFL for tasks assessed/performed      Past Medical History  Diagnosis Date  . Anxiety   . ICH (intracerebral hemorrhage) ([place])   . Stroke [facility])     Past Surgical History  Procedure Laterality Date  . Dilation and curettage of uterus      There were no vitals filed for this visit.  Visit Diagnosis:  Abnormality of gait      Subjective Assessment - 08/21/15 1022    Subjective Pt reports she does not feel as secure walking without the cane.  Became tearful during session stating husband wants her to go on a cruise but she does not feel like she's progressed well enough for a trip.   Pertinent History Seizures, low BP   Patient Stated Goals "Be as close to normal as much as possible and to go back to running" Get back to taking care of my kids, 50 weeks old and 73 and 84 years old   Currently in Pain? Yes   Pain Score 6   none at rest but present with movement    Pain Location Shoulder   Pain Orientation Right   Pain Descriptors / Indicators Sharp   Pain Type Acute pain   Pain Onset More than a month ago   Pain Frequency Intermittent   Aggravating Factors  certain positions   Pain Relieving Factors stretching   Multiple Pain Sites No      Stretching to R lateral trunk-standing with L lateral lean x 45 seconds x 3, seated propping on L elbow then propping with L elbow and trunk over therapy ball, seated lateral lean L with R UE reach to ceiling  Gait on level ground x 300' plus x 3 times working on R LE control with R AFO and SPC.  Pt continues with R LE circumduction, decreased heel strike R and uses momentum to advance R LE.  Also with lateral lean to R.  Trialed 1/2 and 1" shoe lift on left to attempt to help with R foot clearance.  Appears improved with L lift.  Gait on treadmill in body weight supported system x 6 minutes with R AFO with PTA assisting with foot placement on R.  Pt maintain proper placement without PTA.  Discussed concerns over progress and fatigue.  Support provided.       PT Education - 08/21/15 1534    Education provided Yes   Education Details purpose of trial of shoe lift on left side, monitoring gait for controlled heel strike, hip flexion and avoiding circumdurction, using cane and AFO   Person(s) Educated Patient   Methods Explanation;Demonstration   Comprehension Verbalized understanding  PT Short Term Goals - 07/26/15 1451    PT SHORT TERM GOAL #1   Title Pt will be IND in HEP to improve strength, balance, and endurance. Target date: 07/27/15.   Status Partially Met   PT SHORT TERM GOAL #2   Title Pt will improve gait speed to >/=1.97ft/sec to decrease falls risk. Target date: 07/27/15.   Status Achieved   PT SHORT TERM GOAL #3   Title Pt will ambulate 300' with LRAD, over even terrain, at MOD I level to improve functional mobility. Target date: 07/27/15.   Status Achieved   PT SHORT TERM GOAL #4   Title Pt will perform TUG in </=13.5 seconds with LRAD to decr. falls risk. Target date: 07/27/15.   Status Partially Met   PT SHORT TERM GOAL #5   Title Perform BERG and write STG and LTG as appropriate. Target date: 07/27/15.   Status Achieved   PT SHORT TERM GOAL #6   Title  Pt will improve BERG to >/=51/56 to decr. falls risk. Target date: 07/27/15.   Status Partially Met           PT Long Term Goals - 07/20/15 1125    PT LONG TERM GOAL #1   Title Pt will ambulate 1000' over even/uneven terrain, with LRAD, at MOD I level to improve functional mobiliity. Target date: 08/24/15.   Status On-going   PT LONG TERM GOAL #2   Title Pt will amb. 300' over even terrain without an AD, IND, to safely amb. in home. Target date: 08/24/15.   Status On-going   PT LONG TERM GOAL #3   Title Pt will improve gait speed to >/=2.16ft/sec, with LRAD, to amb. safely in the community. Target date: 08/24/15.   Status On-going   PT LONG TERM GOAL #4   Title Pt will verbalize plans to join a fitness center upon d/c from PT to continue to maintain strength and endurance gains made during PT. Target date: 08/24/15.   Status On-going   PT LONG TERM GOAL #5   Title Pt improve BERG score to >/=55/56 to decr. falls risk. Target date: 08/24/15.   Status On-going               Plan - 08/21/15 1535    Clinical Impression Statement Pt tearful during treatment today and worried about fatigue, slow progress and husbands expectations.  Continue with compensation with gait.  Continue PT per POC.   Pt will benefit from skilled therapeutic intervention in order to improve on the following deficits Abnormal gait;Decreased endurance;Impaired sensation;Decreased knowledge of precautions;Decreased activity tolerance;Decreased knowledge of use of DME;Decreased strength;Impaired UE functional use;Impaired tone;Decreased balance;Decreased mobility;Decreased cognition;Decreased range of motion;Decreased safety awareness;Decreased coordination;Impaired flexibility   Rehab Potential Good   Clinical Impairments Affecting Rehab Potential Seizures which may limit intensity of PT   PT Frequency 2x / week   PT Duration 8 weeks   PT Treatment/Interventions ADLs/Self Care Home Management;Neuromuscular  re-education;Cognitive remediation;Biofeedback;DME Instruction;Gait training;Stair training;Canalith Repostioning;Patient/family education;Orthotic Fit/Training;Balance training;Therapeutic exercise;Manual techniques;Therapeutic activities;Vestibular   PT Next Visit Plan Body weight support treadmill training and gait with L shoe lift (1 inch), R lateral trunk stretches   PT Home Exercise Plan Stretching/strength/balance HEP   Consulted and Agree with Plan of Care Patient        Problem List Patient Active Problem List   Diagnosis Date Noted  . Symptomatic partial epilepsy with simple partial seizures ([place]) 08/21/2015  . Nontraumatic cortical hemorrhage of cerebral hemisphere ([place])   .  Seizure ([place])   . Seizures ([place])   . Adjustment disorder with depressed mood   . Contracture of muscle ankle and foot 06/11/2015  . Hemiplga fol ntrm intcrbl hemor aff right dominant side 06/06/2015  . Left-sided intracerebral hemorrhage ([place]) 06/04/2015  . Seizure disorder as sequela of cerebrovascular accident ([place])   . Seizure disorder ([place]) 06/03/2015  . Sepsis ([place]) 06/03/2015  . UTI (urinary tract infection) 06/03/2015  . Hypotension 06/02/2015  . Right spastic hemiparesis ([place]) 05/28/2015  . 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 History of anxiety disorder 05/28/2015  . ICH (intracerebral hemorrhage) ([place])   . Seizure disorder, nonconvulsive, with status epilepticus ([place])   . Cerebral venous thrombosis of cortical vein   . Cytotoxic cerebral edema ([place])   . IVH (intraventricular hemorrhage) ([place])   . Term pregnancy 05/07/2015  . Spontaneous vaginal delivery 05/07/2015    Narda Bonds 08/21/2015, 3:38 PM  [facility] [address] [place], [place], [address] Phone: [phone]   Fax:  [phone]  Name: Ashley [name] MRN: [id number] Date of Birth: 02-Sep-1976    Narda Bonds, [place] 08/21/2015 3:38 PM Phone: [phone] Fax: [phone]

## 2015-08-21 NOTE — Patient Instructions (Signed)
  Please complete the assigned speech therapy homework and return it to your next session.  

## 2015-08-21 NOTE — Patient Instructions (Signed)
I had a long discussion with the patient and husband regarding her recent intracerebral hemorrhage, seizures, discussed and personally reviewed imaging studies and hospital results.I recommend she continue Keppra and the current dose of 1500 mg twice daily for seizures and Topamax 50 mg twice daily for headache. I advised her to continue follow-up with her primary physician for treatment of depression and if needed increase Prozac dose to 40 mg. She'll continue ongoing outpatient physical and occupational therapy. She was advised not to drive till next follow-up visit in 3 months. Repeat MRI scan of the brain with and without contrast to follow the previous medial temporal lobe abnormality seen on the prior MRI scan in September 2016. Return for follow-up in 3 months or call earlier if necessary.

## 2015-08-22 ENCOUNTER — Telehealth: Payer: Self-pay | Admitting: Physical Medicine & Rehabilitation

## 2015-08-22 NOTE — Therapy (Signed)
[facility] [address] [place], [place], [address] Phone: [phone]   Fax:  [phone]  Occupational Therapy Treatment  Patient Details  Name: Ashley [name] MRN: [id number] Date of Birth: [birth year] No Data Recorded  Encounter Date: 08/21/2015    Past Medical History  Diagnosis Date  . Anxiety   . ICH (intracerebral hemorrhage) ([place])   . Stroke [facility])     Past Surgical History  Procedure Laterality Date  . Dilation and curettage of uterus      There were no vitals filed for this visit.  Visit Diagnosis:  Right spastic hemiparesis ([place])  Weakness due to cerebrovascular accident  Coordination impairment      Treatment: Supine hotpack applied to right shoulder while pt performed AA/ROM shoulder flexion, chest press  with cane, min v.c. Scapular/ shoulder  Retraction in supine Seated AA/ROM mid range with PVC pipe frame then UE Ranger  For shoulder flexion and circumduction in mid range. Min v.c./ facilitation for positioning to avoid shoulder hiking / internal rotation. Pt reported no significant pain at end of session.                        OT Short Term Goals - 07/26/15 1333    OT SHORT TERM GOAL #1   Title I with HEP.   Status Achieved   OT SHORT TERM GOAL #2   Title Pt will demonstrate impoved fine motor coordinationas evidenced by decreasing RUE 9 hole peg test score to 35 secs or less.   Baseline 28.44 secs   Status Achieved   OT SHORT TERM GOAL #3   Title Pt will perform all basic ADLs at a supervision level.   Status Achieved   OT SHORT TERM GOAL #4   Title Pt will use RUE as an active assist for ADLS/IADLS at least 50 % of the time.   Status Achieved   OT SHORT TERM GOAL #5   Title Pt will perform basic home management/ cooking at min A level.   Status Achieved           OT Long Term Goals - 07/12/15 1149    OT LONG TERM GOAL #1   Title Pt will perfom all  basic ADLS modidied independently.   Baseline due 08/27/15   Time 8   Period Weeks   Status On-going   OT LONG TERM GOAL #2   Title Pt will perform basic home management/ cooking at a supervision level demonstrating good safety awareness.   Time 8   Period Weeks   Status On-going   OT LONG TERM GOAL #3   Title Pt will demonstrate ability to retrieve a 3 lbs weight at 120 x 5 reps without dropping with RUE   Time 8   Period Weeks   Status On-going   OT LONG TERM GOAL #4   Title Pt will resume use of RUE as dominant hand at least 75% of the time for ADLs/ IADLs.   Time 8   Period Weeks   Status On-going   OT LONG TERM GOAL #5   Title Pt will demonstrate ability to perform a physical and cognitive task simultaneously with 80% or better accuracy.   Time 8   Period Weeks   Status On-going   OT LONG TERM GOAL #6   Title Pt will demonstrate improved fine motor coordination as evidenced by decreasing RUE 9 hole peg test score to 30 secs or less.  Time 8   Period Weeks   Status On-going               Plan - 08/22/15 1832    Clinical Impression Statement Pt is progressing towards goals limited by shoulder pain and spasticity.   Pt will benefit from skilled therapeutic intervention in order to improve on the following deficits (Retired) Abnormal gait;Decreased coordination;Decreased range of motion;Difficulty walking;Impaired flexibility;Decreased endurance;Decreased safety awareness;Increased edema;Impaired tone;Decreased knowledge of precautions;Decreased activity tolerance;Decreased balance;Decreased knowledge of use of DME;Pain;Impaired UE functional use;Impaired vision/preception;Decreased cognition;Decreased mobility;Decreased strength   Rehab Potential Good   OT Frequency 2x / week   OT Duration 8 weeks   OT Treatment/Interventions Self-care/ADL training;Therapeutic exercise;Patient/family education;Balance training;Ultrasound;Neuromuscular education;Manual  Therapy;Splinting;Therapeutic exercises;Energy conservation;Parrafin;DME and/or AE instruction;Therapeutic activities;Cognitive remediation/compensation;Gait Training;Fluidtherapy;Electrical Stimulation;Moist Heat;Contrast Bath;Passive range of motion;Visual/perceptual remediation/compensation   Plan NMR, review HEP   Consulted and Agree with Plan of Care Patient        Problem List Patient Active Problem List   Diagnosis Date Noted  . Symptomatic partial epilepsy with simple partial seizures ([place]) 08/21/2015  . Nontraumatic cortical hemorrhage of cerebral hemisphere ([place])   . Seizure ([place])   . Seizures ([place])   . Adjustment disorder with depressed mood   . Contracture of muscle ankle and foot 06/11/2015  . Hemiplga fol ntrm intcrbl hemor aff right dominant side 06/06/2015  . Left-sided intracerebral hemorrhage ([place]) 06/04/2015  . Seizure disorder as sequela of cerebrovascular accident ([place])   . Seizure disorder ([place]) 06/03/2015  . Sepsis ([place]) 06/03/2015  . UTI (urinary tract infection) 06/03/2015  . Hypotension 06/02/2015  . Right spastic hemiparesis ([place]) 05/28/2015  . 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 History of anxiety disorder 05/28/2015  . ICH (intracerebral hemorrhage) ([place])   . Seizure disorder, nonconvulsive, with status epilepticus ([place])   . Cerebral venous thrombosis of cortical vein   . Cytotoxic cerebral edema ([place])   . IVH (intraventricular hemorrhage) ([place])   . Term pregnancy 05/07/2015  . Spontaneous vaginal delivery 05/07/2015    Tremane Spurgeon 08/22/2015, 7:36 PM Theone Murdoch, OTR/L [phone] Phone: [phone] 7:36 PM 08/22/2015 [place] [address] [place], [place], [address] Phone: [phone]   Fax:  [phone]  Name: Suriyah Vergara MRN: [id number] Date of Birth: 22-Jul-1976

## 2015-08-22 NOTE — Telephone Encounter (Signed)
Patients pharmacy called to let her know that she will need prior authorization on Voltaren Gel. Please call patient when this is done at [phone].

## 2015-08-23 ENCOUNTER — Ambulatory Visit: Payer: BLUE CROSS/BLUE SHIELD

## 2015-08-23 ENCOUNTER — Ambulatory Visit: Payer: BLUE CROSS/BLUE SHIELD | Admitting: Occupational Therapy

## 2015-08-23 DIAGNOSIS — R278 Other lack of coordination: Secondary | ICD-10-CM

## 2015-08-23 DIAGNOSIS — R269 Unspecified abnormalities of gait and mobility: Secondary | ICD-10-CM

## 2015-08-23 DIAGNOSIS — R29898 Other symptoms and signs involving the musculoskeletal system: Secondary | ICD-10-CM

## 2015-08-23 DIAGNOSIS — R41841 Cognitive communication deficit: Secondary | ICD-10-CM | POA: Diagnosis not present

## 2015-08-23 DIAGNOSIS — IMO0002 Reserved for concepts with insufficient information to code with codable children: Secondary | ICD-10-CM

## 2015-08-23 DIAGNOSIS — R279 Unspecified lack of coordination: Secondary | ICD-10-CM

## 2015-08-23 NOTE — Therapy (Signed)
[facility] [address] [place], [place], [address] Phone: [phone]   Fax:  [phone]  Speech Language Pathology Treatment  Patient Details  Name: Ashley [name] MRN: [id number] Date of Birth: Mar 02, 1976 No Data Recorded  Encounter Date: 08/23/2015      End of Session - 08/23/15 1056    Visit Number 17   Number of Visits 24   Date for SLP Re-Evaluation 09/12/15   Authorization - Visit Number 82   Authorization - Number of Visits 30   SLP Start Time 0934   SLP Stop Time  1015   SLP Time Calculation (min) 41 min   Activity Tolerance Patient tolerated treatment well      Past Medical History  Diagnosis Date  . Anxiety   . ICH (intracerebral hemorrhage) ([place])   . Stroke [facility])     Past Surgical History  Procedure Laterality Date  . Dilation and curettage of uterus      There were no vitals filed for this visit.  Visit Diagnosis: Cognitive communication deficit      Subjective Assessment - 08/23/15 1011    Subjective "I think I would have done like this before the stroke." (re: divided attention)               ADULT SLP TREATMENT - 08/23/15 1013    General Information   Behavior/Cognition Alert;Cooperative;Pleasant mood   Treatment Provided   Treatment provided Cognitive-Linquistic   Pain Assessment   Pain Assessment No/denies pain   Cognitive-Linquistic Treatment   Treatment focused on Cognition   Skilled Treatment (16 minutes cognitive skills) SLP reasoned through with pt the recommendation of whether to go or not go on a cruise with husband. Went through homework with pt and no errors. (25 minutes self-care/home management) SLP discussed severity of pt symptoms of high level cognitive-linguistic deficit. Pt states she thinks that her divided attention is mostly commensurate with premorbid status.    Assessment / Recommendations / Plan   Plan --  decr pt to once a week due to progress    Progression Toward Goals   Progression toward goals Progressing toward goals  Pt may be discharged next 1-3 visits due to baseline status            SLP Short Term Goals - 08/23/15 1059    SLP SHORT TERM GOAL #1   Title pt will alternate attention between simple cognitive-linguistic tasks with 85% success and WFL switch time over 3 sessions   Period --  or 12 visits, for all STGs   Status Achieved   SLP SHORT TERM GOAL #2   Title pt will demo verbal safety awareness related to deficits   Status Achieved   SLP SHORT TERM GOAL #3   Title pt will complete mod complex time, money, sequencing, calendar problems 95% with modified independence (self-correction)   Status Achieved   SLP SHORT TERM GOAL #4   Title pt will demo divided attention in two simple tasks 80% of the time with rare min A   Time 1   Period Weeks   Status Not Met          SLP Long Term Goals - 08/23/15 1059    SLP LONG TERM GOAL #1   Title pt will divide attention between one simple task and one mod complex cognitive linguistic task 90% each task   Time 2   Period Weeks   Status Revised   SLP LONG TERM GOAL #2  Title pt will demo anticipatory awareness by using compensatory measures for memory and/or decr'd attention x1 in a session over 4 sessions   Time 2   Period Weeks   Status Achieved   SLP LONG TERM GOAL #3   Title pt will demo executive function in a simple task with rare min A   Time 2   Period Weeks   Status Achieved          Plan - 08/23/15 1056    Clinical Impression Statement Pt may likely have divided attention for cognitive-linguistic skills at premorbid levels - more assessment to take place next week. Pt has been decr'd to once a week ST due to progress.    Speech Therapy Frequency 2x / week   Duration --  24 visits    Treatment/Interventions Cognitive reorganization;SLP instruction and feedback;Compensatory strategies;Internal/external aids;Environmental  controls;Patient/family education;Functional tasks;Cueing hierarchy;Language facilitation   Potential to Achieve Goals Good   Potential Considerations Ability to learn/carryover information;Severity of impairments        Problem List Patient Active Problem List   Diagnosis Date Noted  . Symptomatic partial epilepsy with simple partial seizures ([place]) 08/21/2015  . Nontraumatic cortical hemorrhage of cerebral hemisphere ([place])   . Seizure ([place])   . Seizures ([place])   . Adjustment disorder with depressed mood   . Contracture of muscle ankle and foot 06/11/2015  . Hemiplga fol ntrm intcrbl hemor aff right dominant side 06/06/2015  . Left-sided intracerebral hemorrhage ([place]) 06/04/2015  . Seizure disorder as sequela of cerebrovascular accident ([place])   . Seizure disorder ([place]) 06/03/2015  . Sepsis ([place]) 06/03/2015  . UTI (urinary tract infection) 06/03/2015  . Hypotension 06/02/2015  . Right spastic hemiparesis ([place]) 05/28/2015  . 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 History of anxiety disorder 05/28/2015  . ICH (intracerebral hemorrhage) ([place])   . Seizure disorder, nonconvulsive, with status epilepticus ([place])   . Cerebral venous thrombosis of cortical vein   . Cytotoxic cerebral edema ([place])   . IVH (intraventricular hemorrhage) ([place])   . Term pregnancy 05/07/2015  . Spontaneous vaginal delivery 05/07/2015    [facility] , [place], [place] 08/23/2015, 11:19 AM  [facility] [address] [place], [place], [address] Phone: [phone]   Fax:  [phone]   Name: Ashley [name] MRN: [id number] Date of Birth: 1976/09/18

## 2015-08-23 NOTE — Therapy (Signed)
[facility] [address] [place], [place], [address] Phone: [phone]   Fax:  [phone]  Physical Therapy Treatment  Patient Details  Name: Ashley [name] MRN: [id number] Date of Birth: 1976-03-05 No Data Recorded  Encounter Date: 08/23/2015      PT End of Session - 08/23/15 1159    Visit Number 17   Number of Visits 17  requesting addtional 8 visits (2x/week for 4 weeks)   Date for PT Re-Evaluation 08/28/15  asking for 8 more visits through 09/26/15.   Authorization Type [employer]. Per Laverda Page, pt gets 30 visits for PT and 30 for OT.   Authorization - Visit Number 17   Authorization - Number of Visits 30   PT Start Time 6962   PT Stop Time 1142   PT Time Calculation (min) 39 min   Equipment Utilized During Treatment Gait belt   Activity Tolerance Patient tolerated treatment well   Behavior During Therapy WFL for tasks assessed/performed      Past Medical History  Diagnosis Date  . Anxiety   . ICH (intracerebral hemorrhage) ([place])   . Stroke [facility])     Past Surgical History  Procedure Laterality Date  . Dilation and curettage of uterus      There were no vitals filed for this visit.  Visit Diagnosis:  Abnormality of gait  Right leg weakness  Coordination impairment      Subjective Assessment - 08/23/15 1105    Subjective Pt denied falls since last visit. Pt reported she has an injection tomorrow in R shoulder for pain. Pt reported she is receiving self-pay PT at [facility].  Pt mentioned Dr. Leonie Man suggested pt use a Walk Aide to improve foot drop but pt reported he assessed foot drop with R AFO donned.   Pertinent History Seizures, low BP   Patient Stated Goals "Be as close to normal as much as possible and to go back to running" Get back to taking care of my kids, 72 weeks old and 64 and 48 years old   Currently in Pain? No/denies                         [facility]  Adult PT Treatment/Exercise - 08/23/15 1112    Ambulation/Gait   Ambulation/Gait Yes   Ambulation/Gait Assistance 5: Supervision   Ambulation/Gait Assistance Details No overt LOB noted and R AFO donned. However, Pt required cues to improve stride length, iimprove lat.wt. shifting onto R LE in order to improve L heel strike, decr. R UE flexion.    Ambulation Distance (Feet) 345 Feet   Assistive device None   Gait Pattern Decreased stance time - right;Decreased hip/knee flexion - right;Decreased dorsiflexion - right;Decreased weight shift to right;Decreased arm swing - right;Trunk rotated posteriorly on right;Decreased step length - left   Ambulation Surface Level;Indoor   Gait velocity 2.60ft/sec. with SPC and 2.76ft/sec. without AD   Standardized Balance Assessment   Standardized Balance Assessment Berg Balance Test   Berg Balance Test   Sit to Stand Able to stand without using hands and stabilize independently   Standing Unsupported Able to stand safely 2 minutes   Sitting with Back Unsupported but Feet Supported on Floor or Stool Able to sit safely and securely 2 minutes   Stand to Sit Sits safely with minimal use of hands   Transfers Able to transfer safely, minor use of hands   Standing Unsupported with Eyes Closed Able  to stand 10 seconds safely   Standing Ubsupported with Feet Together Able to place feet together independently and stand 1 minute safely   From Standing, Reach Forward with Outstretched Arm Can reach confidently >25 cm (10")   From Standing Position, Pick up Object from [place] to pick up shoe safely and easily   From Standing Position, Turn to Look Behind Over each Shoulder Looks behind from both sides and weight shifts well   Turn 360 Degrees Able to turn 360 degrees safely but slowly   Standing Unsupported, Alternately Place Feet on Step/Stool Able to complete 4 steps without aid or supervision   Standing Unsupported, One Foot in Front Able to plae foot ahead of the  other independently and hold 30 seconds   Standing on One Leg Able to lift leg independently and hold > 10 seconds  >10 seconds on LLE and 3 sec. on RLE   Total Score 51           Self Care:     PT Education - 08/23/15 1157    Education provided Yes   Education Details PT discussed NMES to improve R eversion/DF. However, we need to be cautious with its use 2/2 pt's hx of seizures after CVA. PT explained outcome measures and goal progress. PT discussed extending POC to 2x/week for 4 weeks in order to meet partially met goals and to focus on body-weight support treadmill training (BWST).   Person(s) Educated Patient   Methods Explanation   Comprehension Verbalized understanding          PT Short Term Goals - 08/23/15 1203    PT SHORT TERM GOAL #1   Title Pt will be IND in HEP to improve strength, balance, and endurance. Target date: 07/27/15.   Status Partially Met   PT SHORT TERM GOAL #2   Title Pt will improve gait speed to >/=1.80ft/sec to decrease falls risk. Target date: 07/27/15.   Status Achieved   PT SHORT TERM GOAL #3   Title Pt will ambulate 300' with LRAD, over even terrain, at MOD I level to improve functional mobility. Target date: 07/27/15.   Status Achieved   PT SHORT TERM GOAL #4   Title Pt will perform TUG in </=13.5 seconds with LRAD to decr. falls risk. Target date: 07/27/15.   Status Partially Met   PT SHORT TERM GOAL #5   Title Perform BERG and write STG and LTG as appropriate. Target date: 07/27/15.   Status Achieved   PT SHORT TERM GOAL #6   Title Pt will improve BERG to >/=51/56 to decr. falls risk. Target date: 07/27/15.   Status Achieved           PT Long Term Goals - 08/23/15 1203    PT LONG TERM GOAL #1   Title Pt will ambulate 1000' over even/uneven terrain, with LRAD, at MOD I level to improve functional mobiliity. Target date: 08/24/15.   Baseline deferred to new 4 week POC. All unmet goals will be carried over to new 4 week POC:  09/20/15.   Status Deferred   PT LONG TERM GOAL #2   Title Pt will amb. 300' over even terrain without an AD, IND, to safely amb. in home. Target date: 08/24/15.   Status Partially Met   PT LONG TERM GOAL #3   Title Pt will improve gait speed to >/=2.57ft/sec, with LRAD, to amb. safely in the community. Target date: 08/24/15.   Status Partially Met   PT LONG  TERM GOAL #4   Title Pt will verbalize plans to join a fitness center upon d/c from PT to continue to maintain strength and endurance gains made during PT. Target date: 08/24/15.   Status Deferred   PT LONG TERM GOAL #5   Title Pt improve BERG score to >/=55/56 to decr. falls risk. Target date: 08/24/15.   Status Partially Met               Plan - 08/23/15 1200    Clinical Impression Statement Pt dmonstrated progress as she partially met LTGs 2, 3, and 5. Pt met STG 6. LTG 4 deferred and LTG 1 not assessed this session. Pt would continue to benefit from skilled PT to improve safety during functional mobility. PT requesting renewal for 2x/week for 4 weeks in order to focus on high level balance and BWST/overground gait training.   Pt will benefit from skilled therapeutic intervention in order to improve on the following deficits Abnormal gait;Decreased endurance;Impaired sensation;Decreased knowledge of precautions;Decreased activity tolerance;Decreased knowledge of use of DME;Decreased strength;Impaired UE functional use;Impaired tone;Decreased balance;Decreased mobility;Decreased cognition;Decreased range of motion;Decreased safety awareness;Decreased coordination;Impaired flexibility   Rehab Potential Good   Clinical Impairments Affecting Rehab Potential Seizures which may limit intensity of PT   PT Frequency 2x / week   PT Duration 8 weeks   PT Treatment/Interventions ADLs/Self Care Home Management;Neuromuscular re-education;Cognitive remediation;Biofeedback;DME Instruction;Gait training;Stair training;Canalith  Repostioning;Patient/family education;Orthotic Fit/Training;Balance training;Therapeutic exercise;Manual techniques;Therapeutic activities;Vestibular   PT Next Visit Plan Body weight support treadmill training and gait with L shoe lift (1 inch), R lateral trunk stretches. Trial R foot-up brace.   PT Home Exercise Plan Stretching/strength/balance HEP   Consulted and Agree with Plan of Care Patient        Problem List Patient Active Problem List   Diagnosis Date Noted  . Symptomatic partial epilepsy with simple partial seizures ([place]) 08/21/2015  . Nontraumatic cortical hemorrhage of cerebral hemisphere ([place])   . Seizure ([place])   . Seizures ([place])   . Adjustment disorder with depressed mood   . Contracture of muscle ankle and foot 06/11/2015  . Hemiplga fol ntrm intcrbl hemor aff right dominant side 06/06/2015  . Left-sided intracerebral hemorrhage ([place]) 06/04/2015  . Seizure disorder as sequela of cerebrovascular accident ([place])   . Seizure disorder ([place]) 06/03/2015  . Sepsis ([place]) 06/03/2015  . UTI (urinary tract infection) 06/03/2015  . Hypotension 06/02/2015  . Right spastic hemiparesis ([place]) 05/28/2015  . 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 History of anxiety disorder 05/28/2015  . ICH (intracerebral hemorrhage) ([place])   . Seizure disorder, nonconvulsive, with status epilepticus ([place])   . Cerebral venous thrombosis of cortical vein   . Cytotoxic cerebral edema ([place])   . IVH (intraventricular hemorrhage) ([place])   . Term pregnancy 05/07/2015  . Spontaneous vaginal delivery 05/07/2015    Cassidee Deats L 08/23/2015, 12:07 PM  [facility] [address] [place], [place], [address] Phone: [phone]   Fax:  [phone]  Name: Ashley [name] MRN: [id number] Date of Birth: 1976-07-03    Geoffry Paradise, PT,DPT 08/23/2015 12:07 PM Phone: [phone] Fax: [phone]

## 2015-08-23 NOTE — Addendum Note (Signed)
Addended by: Elza Rafter on: 08/23/2015 12:10 PM   Modules accepted: Orders

## 2015-08-23 NOTE — Telephone Encounter (Signed)
Prior auth sent

## 2015-08-24 ENCOUNTER — Ambulatory Visit: Payer: BLUE CROSS/BLUE SHIELD | Admitting: Physical Medicine & Rehabilitation

## 2015-08-24 ENCOUNTER — Encounter: Payer: Self-pay | Admitting: Physical Medicine & Rehabilitation

## 2015-08-24 VITALS — BP 97/65 | HR 76 | Resp 14

## 2015-08-24 DIAGNOSIS — M7501 Adhesive capsulitis of right shoulder: Secondary | ICD-10-CM | POA: Diagnosis not present

## 2015-08-24 DIAGNOSIS — I69120 Aphasia following nontraumatic intracerebral hemorrhage: Secondary | ICD-10-CM | POA: Diagnosis not present

## 2015-08-24 NOTE — Patient Instructions (Signed)
Botox at next visit right lower   This was a different injection compared to last time. This is a deeper injection to help with frozen shoulder.

## 2015-08-24 NOTE — Progress Notes (Signed)
  Shoulder injection Right Gleno humeral ultrasound guidance  Indication:R hemiplegic Shoulder pain not relieved by medication management and other conservative care.  Informed consent was obtained after describing risks and benefits of the procedure with the patient, this includes bleeding, bruising, infection and medication side effects. The patient wishes to proceed and has given written consent. Patient was placed in a left lateral decub position The Right shoulder was marked and prepped with betadine in the Gleno humeral. A 25-gauge 1-1/2 inch needle was inserted into the subacromial area. After negative draw back for blood, a solution containing 1 mL of 6 mg per ML betamethasone and 4 mL of 1% lidocaine was injected. A band aid was applied. The patient tolerated the procedure well. Post procedure instructions were given.

## 2015-08-24 NOTE — Therapy (Signed)
[facility] [address] [place], [place], [address] Phone: [phone]   Fax:  [phone]  Occupational Therapy Treatment  Patient Details  Name: Ashley [name] MRN: [id number] Date of Birth: 28-Feb-1976 No Data Recorded  Encounter Date: 08/23/2015      OT End of Session - 08/24/15 1713    Visit Number 17   Number of Visits 17   Date for OT Re-Evaluation 08/29/15   Authorization Type BC[employer]   Authorization Time Period 30 visit OT- plan to renew next visit    Authorization - Visit Number 24   Authorization - Number of Visits 30   OT Start Time 1017   OT Stop Time 1100   OT Time Calculation (min) 43 min   Activity Tolerance Patient tolerated treatment well   Behavior During Therapy [facility] for tasks assessed/performed      Past Medical History  Diagnosis Date  . Anxiety   . ICH (intracerebral hemorrhage) ([place])   . Stroke [facility])     Past Surgical History  Procedure Laterality Date  . Dilation and curettage of uterus      There were no vitals filed for this visit.  Visit Diagnosis:  Weakness due to cerebrovascular accident  Coordination impairment  Decreased coordination      Subjective Assessment - 08/23/15 1019    Pertinent History see epic snapshot; pt with SAH 2 weeks after deliverying her 3rd child   Patient Stated Goals to be able to use my R arm normally   Currently in Pain? Yes   Pain Score 4    Pain Location Shoulder (only with movment)   Pain Orientation Right   Pain Descriptors / Indicators Sharp   Pain Type Acute pain   Pain Onset More than a month ago   Pain Frequency Intermittent   Aggravating Factors  malpositioning   Pain Relieving Factors stretching   Effect of Pain on Daily Activities limits functional use.   Multiple Pain Sites No        Treatment: Neuromuscular re-ed, supine, AA/ROM shoulder flexion and chest press with therapist facilitating right shoulder/ scapula, gentle  joing mobs to right shoulder with pt reaching in abduction, weightbearing through RUE in sidelying, Modified cat/ cow and rocking forwards and back in modified quadraped, followed by lateral weightshifts/ A-P tilts with trunk rotation while weightbearing though bilateral UE's, min v.c. UE Ranger for shoulder flexion and circumduction 2 sets  Of 10-15 reps each, with min facilitation. Pt reported no significant pain at end of session.                        OT Short Term Goals - 07/26/15 1333    OT SHORT TERM GOAL #1   Title I with HEP.   Status Achieved   OT SHORT TERM GOAL #2   Title Pt will demonstrate impoved fine motor coordinationas evidenced by decreasing RUE 9 hole peg test score to 35 secs or less.   Baseline 28.44 secs   Status Achieved   OT SHORT TERM GOAL #3   Title Pt will perform all basic ADLs at a supervision level.   Status Achieved   OT SHORT TERM GOAL #4   Title Pt will use RUE as an active assist for ADLS/IADLS at least 50 % of the time.   Status Achieved   OT SHORT TERM GOAL #5   Title Pt will perform basic home management/ cooking at min A level.  Status Achieved           OT Long Term Goals - 08/23/15 1020    OT LONG TERM GOAL #1   Title Pt will perfom all basic ADLS modidied independently.   Baseline min A with washing hair   Time 8   Period Weeks   Status On-going   OT LONG TERM GOAL #2   Title Pt will perform basic home management/ cooking at a supervision level demonstrating good safety awareness.   Baseline patially met, pt is doing light cleaning, not really back to cooking   Time 8   Period Weeks   Status On-going   OT LONG TERM GOAL #3   Title Pt will demonstrate ability to retrieve a 3 lbs weight at 120 x 5 reps without dropping with RUE   Time 8   Period Weeks   Status On-going   OT LONG TERM GOAL #4   Title Pt will resume use of RUE as dominant hand at least 75% of the time for ADLs/ IADLs.   Baseline 30%   Time 8    Status On-going   OT LONG TERM GOAL #5   Title Pt will demonstrate ability to perform a physical and cognitive task simultaneously with 80% or better accuracy.   Baseline not fully addressed due to recent shoulder pain   Time 8   Status On-going   OT LONG TERM GOAL #6   Title Pt will demonstrate improved fine motor coordination as evidenced by decreasing RUE 9 hole peg test score to 30 secs or less.   Baseline 25.47 secs   Time 8               Plan - 08/24/15 1711    Clinical Impression Statement Pt is progressing towards goals yet she continues to demonstrate apin and guarding in RUE which limits progress. Pt is scheduled to see Dr. Letta Pate for an injection tomorrow for pain relief.   Pt will benefit from skilled therapeutic intervention in order to improve on the following deficits (Retired) Abnormal gait;Decreased coordination;Decreased range of motion;Difficulty walking;Impaired flexibility;Decreased endurance;Decreased safety awareness;Increased edema;Impaired tone;Decreased knowledge of precautions;Decreased activity tolerance;Decreased balance;Decreased knowledge of use of DME;Pain;Impaired UE functional use;Impaired vision/preception;Decreased cognition;Decreased mobility;Decreased strength   Rehab Potential Good   OT Frequency 2x / week   OT Duration 8 weeks   OT Treatment/Interventions Self-care/ADL training;Therapeutic exercise;Patient/family education;Balance training;Ultrasound;Neuromuscular education;Manual Therapy;Splinting;Therapeutic exercises;Energy conservation;Parrafin;DME and/or AE instruction;Therapeutic activities;Cognitive remediation/compensation;Gait Training;Fluidtherapy;Electrical Stimulation;Moist Heat;Contrast Bath;Passive range of motion;Visual/perceptual remediation/compensation   Plan NMR, renew next visit   OT Home Exercise Plan issued: ball ex in supine, trunk for A-p tilts with trunk rotation(07/05/15)   Consulted and Agree with Plan of Care Patient         Problem List Patient Active Problem List   Diagnosis Date Noted  . Adhesive capsulitis of right shoulder 08/24/2015  . Symptomatic partial epilepsy with simple partial seizures ([place]) 08/21/2015  . Nontraumatic cortical hemorrhage of cerebral hemisphere ([place])   . Seizure ([place])   . Seizures ([place])   . Adjustment disorder with depressed mood   . Contracture of muscle ankle and foot 06/11/2015  . Hemiplga fol ntrm intcrbl hemor aff right dominant side 06/06/2015  . Left-sided intracerebral hemorrhage ([place]) 06/04/2015  . Seizure disorder as sequela of cerebrovascular accident ([place])   . Seizure disorder ([place]) 06/03/2015  . Sepsis ([place]) 06/03/2015  . UTI (urinary tract infection) 06/03/2015  . Hypotension 06/02/2015  . Right spastic hemiparesis ([place]) 05/28/2015  . Aphasia  following nontraumatic intracerebral hemorrhage 05/28/2015  . History of anxiety disorder 05/28/2015  . ICH (intracerebral hemorrhage) ([place])   . Seizure disorder, nonconvulsive, with status epilepticus ([place])   . Cerebral venous thrombosis of cortical vein   . Cytotoxic cerebral edema ([place])   . IVH (intraventricular hemorrhage) ([name])   . Term pregnancy 05/07/2015  . Spontaneous vaginal delivery 05/07/2015    RINE,KATHRYN 08/24/2015, 5:15 PM Theone Murdoch, OTR/L [phone] Phone: [phone] 5:15 PM 08/24/2015 [facility] [address] [place], [place], [address] Phone: [phone]   Fax:  [phone]  Name: Ashley [name] MRN: [id number] Date of Birth: 27-Nov-1975

## 2015-08-28 ENCOUNTER — Ambulatory Visit: Payer: BLUE CROSS/BLUE SHIELD

## 2015-08-28 ENCOUNTER — Ambulatory Visit: Payer: BLUE CROSS/BLUE SHIELD | Admitting: Occupational Therapy

## 2015-08-28 DIAGNOSIS — R41841 Cognitive communication deficit: Secondary | ICD-10-CM | POA: Diagnosis not present

## 2015-08-28 DIAGNOSIS — G8111 Spastic hemiplegia affecting right dominant side: Secondary | ICD-10-CM

## 2015-08-28 DIAGNOSIS — R278 Other lack of coordination: Secondary | ICD-10-CM

## 2015-08-28 DIAGNOSIS — R279 Unspecified lack of coordination: Secondary | ICD-10-CM

## 2015-08-28 DIAGNOSIS — R269 Unspecified abnormalities of gait and mobility: Secondary | ICD-10-CM

## 2015-08-28 DIAGNOSIS — M25511 Pain in right shoulder: Secondary | ICD-10-CM

## 2015-08-28 DIAGNOSIS — IMO0002 Reserved for concepts with insufficient information to code with codable children: Secondary | ICD-10-CM

## 2015-08-28 NOTE — Therapy (Signed)
[facility] [address] [place], [place], [address] Phone: [phone]   Fax:  [phone]  Occupational Therapy Treatment  Patient Details  Name: Ashley [name] MRN: [id number] Date of Birth: 03/25/1976 No Data Recorded  Encounter Date: 08/28/2015      OT End of Session - 08/28/15 1921    Visit Number 18   Number of Visits 34   Date for OT Re-Evaluation 10/26/14   Authorization Type BCBS   Authorization Time Period 30 visit OT- week 1/8   Authorization - Visit Number 18   Authorization - Number of Visits 30   OT Start Time 3149   OT Stop Time 1445   OT Time Calculation (min) 40 min   Activity Tolerance Patient tolerated treatment well   Behavior During Therapy [facility] for tasks assessed/performed      Past Medical History  Diagnosis Date  . Anxiety   . ICH (intracerebral hemorrhage) ([place])   . Stroke [facility])     Past Surgical History  Procedure Laterality Date  . Dilation and curettage of uterus      There were no vitals filed for this visit.  Visit Diagnosis:  Weakness due to cerebrovascular accident - Plan: Ot plan of care cert/re-cert  Decreased coordination - Plan: Ot plan of care cert/re-cert  Right spastic hemiparesis ([place]) - Plan: Ot plan of care cert/re-cert  Pain in joint of right shoulder - Plan: Ot plan of care cert/re-cert      Subjective Assessment - 08/28/15 1942    Subjective  Pt reports continued intermittant shoulder pain   Pertinent History see epic snapshot; pt with SAH 2 weeks after deliverying her 3rd child   Patient Stated Goals to be able to use my R arm normally   Currently in Pain? Yes   Pain Score 4    Pain Location Shoulder   Pain Orientation Right   Pain Descriptors / Indicators Aching   Pain Type Acute pain   Pain Onset More than a month ago   Pain Frequency Intermittent   Aggravating Factors  malpositioning/ shoulder flexion   Pain Relieving Factors stretching    Effect of Pain on Daily Activities limits use   Multiple Pain Sites No        Ultrasound 57mhz, 0.8 w/cm 2 20% x 8 mins to right lateral deltoid/ shoulder due to shoulder pain, no adverse reactions.  Neuromuscular re-ed, supine, AA/ROM shoulder flexion and chest press. Seated bilateral UE's in external rotation while weightbearing though yoga blocks, for scapular retraction, min facilitation. AA/ROM shoulder flexion mid range with mod facilitation to avoid compensation.   Modified cat/ cow then  rocking forwards and back in modified quadraped, followed by lateral weightshifts/ A-P tilts with trunk rotation while weightbearing though bilateral UE's, min v.c. UE Ranger for shoulder flexion and circumduction 2 sets  of 10-15 reps each, with min facilitation.                      OT Short Term Goals - 08/28/15 1926    OT SHORT TERM GOAL #1   Title I with HEP.   Status Achieved   OT SHORT TERM GOAL #2   Title Pt will demonstrate impoved fine motor coordinationas evidenced by decreasing RUE 9 hole peg test score to 35 secs or less.   Baseline 28.44 secs   Status Achieved   OT SHORT TERM GOAL #3   Title Pt will perform all basic ADLs at a  supervision level.   Status Achieved   OT SHORT TERM GOAL #4   Title Pt will use RUE as an active assist for ADLS/IADLS at least 50 % of the time.   Status Achieved   OT SHORT TERM GOAL #5   Title Pt will perform basic home management/ cooking at min A level.   Status Achieved   Additional Short Term Goals   Additional Short Term Goals Yes   OT SHORT TERM GOAL #6   Title Pt will perform home management/ cooking at a supervision level consistently.   Baseline due 09/27/15   Time 4   Period Weeks   Status New   OT SHORT TERM GOAL #7   Title Pt will demonstrate ability to retrieve a lightweight object at 95* with pain less than or equal to 3/10.   Time 4   Period Weeks   Status New           OT Long Term Goals - 08/28/15 1929     OT LONG TERM GOAL #1   Title Pt will perfom all basic ADLS modified independently. due 10/27/15   Baseline min A with washing hair, difficulty with hygeine    Time 8   Period Weeks   Status On-going   OT LONG TERM GOAL #2   Title Pt will perform basic home management/ cooking at a supervision level demonstrating good safety awareness.- not met (now STG)   Baseline revised long term goal- modified independent with cooking and home management with pt demonstrating good safety awareness   Time 8   Period Weeks   Status On-going   OT LONG TERM GOAL #3   Title Pt will demonstrate ability to retrieve a 3 lbs weight at 120 x 5 reps without dropping with RUE   Baseline goal deferred due to0 shoulder pain   Time 8   Period Weeks   Status Not Met   OT LONG TERM GOAL #4   Title Pt will resume use of RUE as dominant hand at least 75% of the time for ADLs/ IADLs. not met 30% goal revised   Baseline revised goal- Pt will use RUE as an active assist 75% of the time    Time 8   Status On-going   OT LONG TERM GOAL #5   Title Pt will demonstrate ability to perform a physical and cognitive task simultaneously with 80% or better accuracy.   Baseline not fully addressed due to recent shoulder pain-    Time 8   Status Deferred   Long Term Additional Goals   Additional Long Term Goals Yes   OT LONG TERM GOAL #6   Title Pt will demonstrate improved fine motor coordination as evidenced by decreasing RUE 9 hole peg test score to 30 secs or less.   Baseline 25.47 secs   Time 8   Status Achieved               Plan - 08/28/15 1924    Clinical Impression Statement Pt. is progressing towards goals yet she remains limited by pain and spasticity in RUE. Pt can benefit from continued skilled occupational therapy to maximize RUE A/ROM and functional use for ADLS/IADLS.   Pt will benefit from skilled therapeutic intervention in order to improve on the following deficits (Retired) Abnormal gait;Decreased  coordination;Decreased range of motion;Difficulty walking;Impaired flexibility;Decreased endurance;Decreased safety awareness;Increased edema;Impaired tone;Decreased knowledge of precautions;Decreased activity tolerance;Decreased balance;Decreased knowledge of use of DME;Pain;Impaired UE functional use;Impaired vision/preception;Decreased cognition;Decreased mobility;Decreased strength  Rehab Potential Good   OT Frequency 2x / week   OT Duration 8 weeks   OT Treatment/Interventions Self-care/ADL training;Therapeutic exercise;Patient/family education;Balance training;Ultrasound;Neuromuscular education;Manual Therapy;Splinting;Therapeutic exercises;Energy conservation;Parrafin;DME and/or AE instruction;Therapeutic activities;Cognitive remediation/compensation;Gait Training;Fluidtherapy;Electrical Stimulation;Moist Heat;Contrast Bath;Passive range of motion;Visual/perceptual remediation/compensation   Plan Neuro re-ed   OT Home Exercise Plan issued: ball ex in supine, trunk for A-p tilts with trunk rotation(07/05/15)   Consulted and Agree with Plan of Care Patient        Problem List Patient Active Problem List   Diagnosis Date Noted  . Adhesive capsulitis of right shoulder 08/24/2015  . Symptomatic partial epilepsy with simple partial seizures ([place]) 08/21/2015  . Nontraumatic cortical hemorrhage of cerebral hemisphere ([place])   . Seizure ([place])   . Seizures ([place])   . Adjustment disorder with depressed mood   . Contracture of muscle ankle and foot 06/11/2015  . Hemiplga fol ntrm intcrbl hemor aff right dominant side 06/06/2015  . Left-sided intracerebral hemorrhage ([place]) 06/04/2015  . Seizure disorder as sequela of cerebrovascular accident ([place])   . Seizure disorder ([place]) 06/03/2015  . Sepsis ([place]) 06/03/2015  . UTI (urinary tract infection) 06/03/2015  . Hypotension 06/02/2015  . Right spastic hemiparesis ([place]) 05/28/2015  . Aphasia following nontraumatic intracerebral hemorrhage 05/28/2015   . History of anxiety disorder 05/28/2015  . ICH (intracerebral hemorrhage) ([place])   . Seizure disorder, nonconvulsive, with status epilepticus ([place])   . Cerebral venous thrombosis of cortical vein   . Cytotoxic cerebral edema ([place])   . IVH (intraventricular hemorrhage) ([place])   . Term pregnancy 05/07/2015  . Spontaneous vaginal delivery 05/07/2015    RINE,KATHRYN 08/28/2015, 7:49 PM Theone Murdoch, OTR/L [phone] Phone: [phone] 7:49 PM 08/28/2015 [place] [address] [place], [place], [address] Phone: [phone]   Fax:  [phone]  Name: Ashley [name] MRN: [id number] Date of Birth: 1975-12-17

## 2015-08-28 NOTE — Therapy (Signed)
[facility] [address] [place], [place], [address] Phone: [phone]   Fax:  [phone]  Physical Therapy Treatment  Patient Details  Name: Ashley [name] MRN: [id number] Date of Birth: 1976-07-15 No Data Recorded  Encounter Date: 08/28/2015      PT End of Session - 08/28/15 1626    Visit Number 18   Number of Visits 25   Date for PT Re-Evaluation 09/26/15   Authorization Type [employer]. Per Laverda Page, pt gets 30 visits for PT and 30 for OT.   Authorization - Visit Number 18   Authorization - Number of Visits 30   PT Start Time 3244   PT Stop Time 1400   PT Time Calculation (min) 46 min   Equipment Utilized During Treatment --  BWSTT harness   Activity Tolerance Patient tolerated treatment well   Behavior During Therapy WFL for tasks assessed/performed      Past Medical History  Diagnosis Date  . Anxiety   . ICH (intracerebral hemorrhage) ([place])   . Stroke [facility])     Past Surgical History  Procedure Laterality Date  . Dilation and curettage of uterus      There were no vitals filed for this visit.  Visit Diagnosis:  Abnormality of gait      Subjective Assessment - 08/28/15 1318    Subjective Pt denied falls or changes since last visit. Pt brought in new tennis shoes and orthotist Marcello Moores) cut insert to accomdate R AFO. Pt reported she received R shoulder injection and it reduced pain but she still has difficulty performing bathroom hygiene.    Pertinent History Seizures, low BP   Patient Stated Goals "Be as close to normal as much as possible and to go back to running" Get back to taking care of my kids, 70 weeks old and 67 and 68 years old   Currently in Pain? No/denies                         [facility] Adult PT Treatment/Exercise - 08/28/15 1321    Ambulation/Gait   Ambulation/Gait Yes   Ambulation/Gait Assistance 5: Supervision;4: Min guard;4: Min assist   Ambulation/Gait Assistance  Details Pt ambulated in body-weight support treadmill training (BWSTT) with approx. 40% body wt. supported at 1.2-1.72mh for approx. 7 minutes. Pt required one standing rest breaks 2/2 fear of falling on treadmill at incr. speeds. PT explained that pt is supported in harness and has 2 people guarding her and she was then able to amb. on treadmill at incr. speeds. PT tech guarding pt, and PT assisting R LE with knee flexion, R DF, and R toe off. Cues to improve stride length, and L heel strike. Pt then amb. over even terrain with SPC and min A to improve lat. wt. shift to R side. Pt amb. 117' with R heel wedge and no brace, and 117' with R heel wedge and R foot-up brace, and 117' with new shoes and R AFO.    Ambulation Distance (Feet) 117 Feet, 230' and treadmill training with new shoe and R AFO donned   Assistive device Body weight support system;Straight cane   Gait Pattern Decreased stance time - right;Decreased hip/knee flexion - right;Decreased dorsiflexion - right;Decreased weight shift to right;Decreased arm swing - right;Trunk rotated posteriorly on right;Decreased step length - left   Ambulation Surface Level;Indoor           Self Care:     PT Education -  08/28/15 1625    Education provided Yes   Education Details PT and orthotist Marcello Moores from [place]) cut R shoe insert and fit R AFO into new shoe. Pt reported no discomfort during amb. with new shoe. However, PT had pt donn old tennis shoes for BWSTT and gait training and asked pt to slowly "break-in" new shoes, by wearing for 10 minutes at a time and increasing as tolearted.   Person(s) Educated Patient   Methods Explanation   Comprehension Verbalized understanding          PT Short Term Goals - 08/23/15 1203    PT SHORT TERM GOAL #1   Title Pt will be IND in HEP to improve strength, balance, and endurance. Target date: 07/27/15.   Status Partially Met   PT SHORT TERM GOAL #2   Title Pt will improve gait speed to >/=1.58f/sec  to decrease falls risk. Target date: 07/27/15.   Status Achieved   PT SHORT TERM GOAL #3   Title Pt will ambulate 300' with LRAD, over even terrain, at MOD I level to improve functional mobility. Target date: 07/27/15.   Status Achieved   PT SHORT TERM GOAL #4   Title Pt will perform TUG in </=13.5 seconds with LRAD to decr. falls risk. Target date: 07/27/15.   Status Partially Met   PT SHORT TERM GOAL #5   Title Perform BERG and write STG and LTG as appropriate. Target date: 07/27/15.   Status Achieved   PT SHORT TERM GOAL #6   Title Pt will improve BERG to >/=51/56 to decr. falls risk. Target date: 07/27/15.   Status Achieved           PT Long Term Goals - 08/28/15 1628    PT LONG TERM GOAL #1   Title Pt will ambulate 1000' over even/uneven terrain, with LRAD, at MOD I level to improve functional mobiliity. Target date: 09/25/15.   Baseline deferred to new 4 week POC. All unmet goals will be carried over to new 4 week POC: 09/25/15.   Status On-going   PT LONG TERM GOAL #2   Title Pt will amb. 300' over even terrain without an AD, IND, to safely amb. in home. Target date: 09/25/15.   Status On-going   PT LONG TERM GOAL #3   Title Pt will improve gait speed to >/=2.684fsec, with LRAD, to amb. safely in the community. Target date: 09/25/15.   Status On-going   PT LONG TERM GOAL #4   Title Pt will verbalize plans to join a fitness center upon d/c from PT to continue to maintain strength and endurance gains made during PT. Target date: 09/25/15.   Status On-going   PT LONG TERM GOAL #5   Title Pt improve BERG score to >/=55/56 to decr. falls risk. Target date: 09/25/15.   Status On-going               Plan - 08/28/15 1627    Clinical Impression Statement Pt demonstrated progress as she was able to tolerate gait training on treadmill at increased speed and less rest breaks during session today. Continue with POC.   Pt will benefit from skilled therapeutic intervention  in order to improve on the following deficits Abnormal gait;Decreased endurance;Impaired sensation;Decreased knowledge of precautions;Decreased activity tolerance;Decreased knowledge of use of DME;Decreased strength;Impaired UE functional use;Impaired tone;Decreased balance;Decreased mobility;Decreased cognition;Decreased range of motion;Decreased safety awareness;Decreased coordination;Impaired flexibility   Rehab Potential Good   Clinical Impairments Affecting Rehab Potential Seizures which may limit intensity  of PT   PT Frequency 2x / week   PT Duration 8 weeks   PT Treatment/Interventions ADLs/Self Care Home Management;Neuromuscular re-education;Cognitive remediation;Biofeedback;DME Instruction;Gait training;Stair training;Canalith Repostioning;Patient/family education;Orthotic Fit/Training;Balance training;Therapeutic exercise;Manual techniques;Therapeutic activities;Vestibular   PT Next Visit Plan Body weight support treadmill training and gait with L shoe lift (1 inch), R lateral trunk stretches. Trial R foot-up brace.   PT Home Exercise Plan Stretching/strength/balance HEP   Consulted and Agree with Plan of Care Patient        Problem List Patient Active Problem List   Diagnosis Date Noted  . Adhesive capsulitis of right shoulder 08/24/2015  . Symptomatic partial epilepsy with simple partial seizures ([place]) 08/21/2015  . Nontraumatic cortical hemorrhage of cerebral hemisphere ([place])   . Seizure ([place])   . Seizures ([place])   . Adjustment disorder with depressed mood   . Contracture of muscle ankle and foot 06/11/2015  . Hemiplga fol ntrm intcrbl hemor aff right dominant side 06/06/2015  . Left-sided intracerebral hemorrhage ([place]) 06/04/2015  . Seizure disorder as sequela of cerebrovascular accident ([place])   . Seizure disorder ([place]) 06/03/2015  . Sepsis ([place]) 06/03/2015  . UTI (urinary tract infection) 06/03/2015  . Hypotension 06/02/2015  . Right spastic hemiparesis ([place]) 05/28/2015   . 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 History of anxiety disorder 05/28/2015  . ICH (intracerebral hemorrhage) ([place])   . Seizure disorder, nonconvulsive, with status epilepticus ([place])   . Cerebral venous thrombosis of cortical vein   . Cytotoxic cerebral edema ([place])   . IVH (intraventricular hemorrhage) ([place])   . Term pregnancy 05/07/2015  . Spontaneous vaginal delivery 05/07/2015    Geral Coker L 08/28/2015, 4:33 PM  [facility] [address] [place], [place], [address] Phone: [phone]   Fax:  [phone]  Name: Ashley [name] MRN: [id number] Date of Birth: Mar 15, 1976     Geoffry Paradise, PT,DPT 08/28/2015 4:33 PM Phone: [phone] Fax: [phone]

## 2015-08-29 ENCOUNTER — Telehealth: Payer: Self-pay | Admitting: Physical Medicine & Rehabilitation

## 2015-08-29 MED ORDER — DICLOFENAC SODIUM 1 % TD GEL: 2.0000 g | Freq: Four times a day (QID) | TRANSDERMAL | Status: DC

## 2015-08-29 NOTE — Telephone Encounter (Signed)
Called pt, talked to husband and explained the denial to pt's husband.  He said to please send in an order for the generic form and he will see how much that is. If it is cost prohibitive, they will try the aspercreme

## 2015-08-29 NOTE — Telephone Encounter (Signed)
Patient stated [facility] has not gotten prescription for Voltaren Gel.  Please call patient when this is done at [phone].

## 2015-08-29 NOTE — Telephone Encounter (Signed)
Patient can get Aspercreme over-the-counter given that oral nonsteroidals are not appropriate post stroke

## 2015-08-29 NOTE — Telephone Encounter (Signed)
Prior authorization for Voltaren Gel was denied by [employer] of [place], Please advise

## 2015-08-30 ENCOUNTER — Ambulatory Visit: Payer: BLUE CROSS/BLUE SHIELD

## 2015-08-30 DIAGNOSIS — R269 Unspecified abnormalities of gait and mobility: Secondary | ICD-10-CM

## 2015-08-30 DIAGNOSIS — R41841 Cognitive communication deficit: Secondary | ICD-10-CM | POA: Diagnosis not present

## 2015-08-30 DIAGNOSIS — R29898 Other symptoms and signs involving the musculoskeletal system: Secondary | ICD-10-CM

## 2015-08-30 NOTE — Therapy (Signed)
[facility] [address] [place], [place], [address] Phone: [phone]   Fax:  [phone]  Physical Therapy Treatment  Patient Details  Name: Ashley [name] MRN: [id number] Date of Birth: 27-Aug-1976 No Data Recorded  Encounter Date: 08/30/2015      PT End of Session - 08/30/15 1541    Visit Number 19   Number of Visits 25   Date for PT Re-Evaluation 09/26/15   Authorization Type [employer]. Per Laverda Page, pt gets 30 visits for PT and 30 for OT.   Authorization - Visit Number 19   Authorization - Number of Visits 30   PT Start Time 1401   PT Stop Time 1442   PT Time Calculation (min) 41 min   Equipment Utilized During Treatment Gait belt  harness in BWSTT   Activity Tolerance Patient tolerated treatment well   Behavior During Therapy WFL for tasks assessed/performed      Past Medical History  Diagnosis Date  . Anxiety   . ICH (intracerebral hemorrhage) ([place])   . Stroke [facility])     Past Surgical History  Procedure Laterality Date  . Dilation and curettage of uterus      There were no vitals filed for this visit.  Visit Diagnosis:  Abnormality of gait  Right leg weakness      Subjective Assessment - 08/30/15 1404    Subjective (p) Pt reported she didn't sleep well last night due to shoulder pain. Pt denied falls since last visit.    Pertinent History (p) Seizures, low BP   Patient Stated Goals (p) "Be as close to normal as much as possible and to go back to running" Get back to taking care of my kids, 66 weeks old and 43 and 21 years old   Currently in Pain? (p) No/denies                         [facility] Adult PT Treatment/Exercise - 08/30/15 1538    Ambulation/Gait   Ambulation/Gait Yes   Ambulation/Gait Assistance 5: Supervision;4: Min guard   Ambulation/Gait Assistance Details Pt amb. in BWSTT system, 5 minutes with R AFO donned and 5 minutes without AFO donned at 1.1-1.74mph. Cues to  improve wt. shifting onto R LE, iimprove B heel strike, stride length and decr. R hip ER to clear R foot. Pt demonstrated improve L heel strike and lat. wt. shifting to the R side in BWSTT after cues. Pt also amb. over even/uneven terrain after treadmill training.  Min guard to supervision for safety overground with tactile cues to improve lat. wt. shifting to R side. Pt required one seated rest break after amb. outdoors.    Ambulation Distance (Feet) --  0.2 miles on treadimll, 500' outdoors, 100' indoors   Assistive device Body weight support system;Straight cane   Gait Pattern Decreased stance time - right;Decreased hip/knee flexion - right;Decreased dorsiflexion - right;Decreased weight shift to right;Decreased arm swing - right;Trunk rotated posteriorly on right;Decreased step length - left   Ambulation Surface Level;Unlevel;Indoor;Outdoor;Paved                PT Education - 08/30/15 1544    Education provided Yes   Education Details PT discussed Doreen Salvage with pt, and PT does not feel it is appropriate at this time for ankle DF but may be helpful for R ankle eversion. However, pt does have hx of seizures would could contraindicate use of e-stim. PT educated pt again to  wear new shoes for 10 minutes in order to break them in, as pt wore them for hours last Tuesday and her foot hurt afterwards.   Person(s) Educated Patient   Methods Explanation   Comprehension Verbalized understanding          PT Short Term Goals - 08/23/15 1203    PT SHORT TERM GOAL #1   Title Pt will be IND in HEP to improve strength, balance, and endurance. Target date: 07/27/15.   Status Partially Met   PT SHORT TERM GOAL #2   Title Pt will improve gait speed to >/=1.67ft/sec to decrease falls risk. Target date: 07/27/15.   Status Achieved   PT SHORT TERM GOAL #3   Title Pt will ambulate 300' with LRAD, over even terrain, at MOD I level to improve functional mobility. Target date: 07/27/15.   Status  Achieved   PT SHORT TERM GOAL #4   Title Pt will perform TUG in </=13.5 seconds with LRAD to decr. falls risk. Target date: 07/27/15.   Status Partially Met   PT SHORT TERM GOAL #5   Title Perform BERG and write STG and LTG as appropriate. Target date: 07/27/15.   Status Achieved   PT SHORT TERM GOAL #6   Title Pt will improve BERG to >/=51/56 to decr. falls risk. Target date: 07/27/15.   Status Achieved           PT Long Term Goals - 08/28/15 1628    PT LONG TERM GOAL #1   Title Pt will ambulate 1000' over even/uneven terrain, with LRAD, at MOD I level to improve functional mobiliity. Target date: 09/25/15.   Baseline deferred to new 4 week POC. All unmet goals will be carried over to new 4 week POC: 09/25/15.   Status On-going   PT LONG TERM GOAL #2   Title Pt will amb. 300' over even terrain without an AD, IND, to safely amb. in home. Target date: 09/25/15.   Status On-going   PT LONG TERM GOAL #3   Title Pt will improve gait speed to >/=2.6ft/sec, with LRAD, to amb. safely in the community. Target date: 09/25/15.   Status On-going   PT LONG TERM GOAL #4   Title Pt will verbalize plans to join a fitness center upon d/c from PT to continue to maintain strength and endurance gains made during PT. Target date: 09/25/15.   Status On-going   PT LONG TERM GOAL #5   Title Pt improve BERG score to >/=55/56 to decr. falls risk. Target date: 09/25/15.   Status On-going               Plan - 08/30/15 1542    Clinical Impression Statement Pt demonstrated progress, as she was able to improve wt. shifting to R LE during treadmill training and experience some carry over during overground gait training. Pt demonstrated fluid gait pattern after cues during BWSTT and improve B heel strike. Pt noted to experience incr. R scapular winging and R elbow flexion during gait, elbow decr. with cues but scap. winging still present this may be 2/2 weak serratus ant. muscles. Continue with POC.    Pt will benefit from skilled therapeutic intervention in order to improve on the following deficits Abnormal gait;Decreased endurance;Impaired sensation;Decreased knowledge of precautions;Decreased activity tolerance;Decreased knowledge of use of DME;Decreased strength;Impaired UE functional use;Impaired tone;Decreased balance;Decreased mobility;Decreased cognition;Decreased range of motion;Decreased safety awareness;Decreased coordination;Impaired flexibility   Rehab Potential Good   Clinical Impairments Affecting Rehab Potential Seizures which may  limit intensity of PT   PT Frequency 2x / week   PT Duration 8 weeks   PT Treatment/Interventions ADLs/Self Care Home Management;Neuromuscular re-education;Cognitive remediation;Biofeedback;DME Instruction;Gait training;Stair training;Canalith Repostioning;Patient/family education;Orthotic Fit/Training;Balance training;Therapeutic exercise;Manual techniques;Therapeutic activities;Vestibular   PT Next Visit Plan Body weight support treadmill training and gait with L shoe lift (1 inch), R lateral trunk stretches. Trial R foot-up brace again   PT Home Exercise Plan Stretching/strength/balance HEP   Consulted and Agree with Plan of Care Patient        Problem List Patient Active Problem List   Diagnosis Date Noted  . Adhesive capsulitis of right shoulder 08/24/2015  . Symptomatic partial epilepsy with simple partial seizures ([place]) 08/21/2015  . Nontraumatic cortical hemorrhage of cerebral hemisphere ([place])   . Seizure ([place])   . Seizures ([place])   . Adjustment disorder with depressed mood   . Contracture of muscle ankle and foot 06/11/2015  . Hemiplga fol ntrm intcrbl hemor aff right dominant side 06/06/2015  . Left-sided intracerebral hemorrhage ([place]) 06/04/2015  . Seizure disorder as sequela of cerebrovascular accident ([place])   . Seizure disorder ([place]) 06/03/2015  . Sepsis ([place]) 06/03/2015  . UTI (urinary tract infection) 06/03/2015  . Hypotension  06/02/2015  . Right spastic hemiparesis ([place]) 05/28/2015  . 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 History of anxiety disorder 05/28/2015  . ICH (intracerebral hemorrhage) ([place])   . Seizure disorder, nonconvulsive, with status epilepticus ([place])   . Cerebral venous thrombosis of cortical vein   . Cytotoxic cerebral edema ([place])   . IVH (intraventricular hemorrhage) ([place])   . Term pregnancy 05/07/2015  . Spontaneous vaginal delivery 05/07/2015    Edger Husain L 08/30/2015, 3:46 PM  [name] [address] [place], [place], [address] Phone: [phone]   Fax:  [phone]  Name: Ashley [name] MRN: [id number] Date of Birth: 1976/04/05    Geoffry Paradise, PT,DPT 08/30/2015 3:46 PM Phone: [phone] Fax: [phone]

## 2015-09-04 ENCOUNTER — Ambulatory Visit: Payer: BLUE CROSS/BLUE SHIELD | Admitting: Occupational Therapy

## 2015-09-04 ENCOUNTER — Ambulatory Visit: Payer: BLUE CROSS/BLUE SHIELD

## 2015-09-04 DIAGNOSIS — R41841 Cognitive communication deficit: Secondary | ICD-10-CM | POA: Diagnosis not present

## 2015-09-04 DIAGNOSIS — M25511 Pain in right shoulder: Secondary | ICD-10-CM

## 2015-09-04 DIAGNOSIS — G8111 Spastic hemiplegia affecting right dominant side: Secondary | ICD-10-CM

## 2015-09-04 DIAGNOSIS — R269 Unspecified abnormalities of gait and mobility: Secondary | ICD-10-CM

## 2015-09-04 DIAGNOSIS — R29898 Other symptoms and signs involving the musculoskeletal system: Secondary | ICD-10-CM

## 2015-09-04 NOTE — Therapy (Signed)
[facility] [address] [place], [place], [address] Phone: [phone]   Fax:  [phone]  Physical Therapy Treatment  Patient Details  Name: Ashley [name] MRN: [id number] Date of Birth: 1976/02/07 No Data Recorded  Encounter Date: 09/04/2015      PT End of Session - 09/04/15 1318    Visit Number 20   Number of Visits 25   Date for PT Re-Evaluation 09/26/15   Authorization Type BCBS. Per Laverda Page, pt gets 30 visits for PT and 30 for OT.   Authorization - Visit Number 20   Authorization - Number of Visits 77   PT Start Time [phone]  PT speaking with pt's OT at beginning of session   PT Stop Time 1012   PT Time Calculation (min) 39 min   Equipment Utilized During Treatment Gait belt   Activity Tolerance Patient tolerated treatment well   Behavior During Therapy WFL for tasks assessed/performed      Past Medical History  Diagnosis Date  . Anxiety   . ICH (intracerebral hemorrhage) ([place])   . Stroke [facility] ([facility]     Past Surgical History  Procedure Laterality Date  . Dilation and curettage of uterus      There were no vitals filed for this visit.  Visit Diagnosis:  Abnormality of gait  Right leg weakness      Subjective Assessment - 09/04/15 0935    Subjective Pt reported MD increased prozac and she feels more tired. Pt denied falls since last visit, but reports she got "tripped up" during gait (foot caught on rug).    Pertinent History Seizures, low BP   Patient Stated Goals "Be as close to normal as much as possible and to go back to running" Get back to taking care of my kids, 25 weeks old and 21 and 1 years old   Currently in Pain? No/denies        Manual therapy: Pt in supine:  PT performed B lower trunk rotation stretch with overpressure, 3x30sec. Hold/side. PT performed B piriformis stretch x1 rep on L side as L side demonstrated good flexibility and x3 reps with 30 second holds for R piriformis  with decr. In muscle tension during 2nd and 3rd reps. No pain reported per pt during MT.  Therex: Pt performed stretching HEP in supine with cues for technique. Please see pt instructions for details. Pt also performed B lateral trunk stretches with pillow and bolster (pool noodle) place superior to pt's iliac crest, with pt in sidelying x2/side with 30 second-1 minute holds.                 [facility] Adult PT Treatment/Exercise - 09/04/15 0937    Ambulation/Gait   Ambulation/Gait Yes   Ambulation/Gait Assistance 5: Supervision   Ambulation/Gait Assistance Details R AFO donned during amb. Pt amb. 150' with 1" shoe lift on L foot to improve functional leg length discrepancy (LLD), with pt noted to experience improve. lateral wt. shift onto R LE with lift. Pt then amb. 230' after stretches and reported she felt a better and able to shift to the R LE, minimal difference noted by PT.   Ambulation Distance (Feet) --  150' and 230'   Assistive device Straight cane   Gait Pattern Decreased stance time - right;Decreased hip/knee flexion - right;Decreased dorsiflexion - right;Decreased weight shift to right;Decreased arm swing - right;Trunk rotated posteriorly on right;Decreased step length - left   Ambulation Surface Level;Indoor  PT Education - 09/04/15 1318    Education provided Yes   Education Details PT educated pt on stretching HEP and pt performed. PT also reiterated the importance of using SPC at all times, to decr. R UE flexion and shoulder shrug.   Person(s) Educated Patient   Methods Explanation;Tactile cues;Verbal cues;Handout   Comprehension Verbalized understanding;Returned demonstration;Need further instruction          PT Short Term Goals - 08/23/15 1203    PT SHORT TERM GOAL #1   Title Pt will be IND in HEP to improve strength, balance, and endurance. Target date: 07/27/15.   Status Partially Met   PT SHORT TERM GOAL #2   Title Pt will improve  gait speed to >/=1.34ft/sec to decrease falls risk. Target date: 07/27/15.   Status Achieved   PT SHORT TERM GOAL #3   Title Pt will ambulate 300' with LRAD, over even terrain, at MOD I level to improve functional mobility. Target date: 07/27/15.   Status Achieved   PT SHORT TERM GOAL #4   Title Pt will perform TUG in </=13.5 seconds with LRAD to decr. falls risk. Target date: 07/27/15.   Status Partially Met   PT SHORT TERM GOAL #5   Title Perform BERG and write STG and LTG as appropriate. Target date: 07/27/15.   Status Achieved   PT SHORT TERM GOAL #6   Title Pt will improve BERG to >/=51/56 to decr. falls risk. Target date: 07/27/15.   Status Achieved           PT Long Term Goals - 08/28/15 1628    PT LONG TERM GOAL #1   Title Pt will ambulate 1000' over even/uneven terrain, with LRAD, at MOD I level to improve functional mobiliity. Target date: 09/25/15.   Baseline deferred to new 4 week POC. All unmet goals will be carried over to new 4 week POC: 09/25/15.   Status On-going   PT LONG TERM GOAL #2   Title Pt will amb. 300' over even terrain without an AD, IND, to safely amb. in home. Target date: 09/25/15.   Status On-going   PT LONG TERM GOAL #3   Title Pt will improve gait speed to >/=2.30ft/sec, with LRAD, to amb. safely in the community. Target date: 09/25/15.   Status On-going   PT LONG TERM GOAL #4   Title Pt will verbalize plans to join a fitness center upon d/c from PT to continue to maintain strength and endurance gains made during PT. Target date: 09/25/15.   Status On-going   PT LONG TERM GOAL #5   Title Pt improve BERG score to >/=55/56 to decr. falls risk. Target date: 09/25/15.   Status On-going               Plan - 09/04/15 1319    Clinical Impression Statement Pt continues to experienced decrease wt. shifting onto R LE, with noted improvement with 1" shoe lift placed on  L LE during amb. PT assessed for leg length discrepancy (LLD) and noted pt's R  ASIS approx. 1" superior than L ASIS in standing but B medial malleolus equal when testing for LLD in supine, indicating LLD is functional vs. structural. Continue with POC.   Pt will benefit from skilled therapeutic intervention in order to improve on the following deficits Abnormal gait;Decreased endurance;Impaired sensation;Decreased knowledge of precautions;Decreased activity tolerance;Decreased knowledge of use of DME;Decreased strength;Impaired UE functional use;Impaired tone;Decreased balance;Decreased mobility;Decreased cognition;Decreased range of motion;Decreased safety awareness;Decreased coordination;Impaired flexibility  Rehab Potential Good   Clinical Impairments Affecting Rehab Potential Seizures which may limit intensity of PT   PT Frequency 2x / week   PT Duration 8 weeks   PT Treatment/Interventions ADLs/Self Care Home Management;Neuromuscular re-education;Cognitive remediation;Biofeedback;DME Instruction;Gait training;Stair training;Canalith Repostioning;Patient/family education;Orthotic Fit/Training;Balance training;Therapeutic exercise;Manual techniques;Therapeutic activities;Vestibular   PT Next Visit Plan Body weight support treadmill training. Trial shoe lift insert in L shoe. Trial R foot-up brace again   PT Home Exercise Plan Stretching/strength/balance HEP   Consulted and Agree with Plan of Care Patient        Problem List Patient Active Problem List   Diagnosis Date Noted  . Adhesive capsulitis of right shoulder 08/24/2015  . Symptomatic partial epilepsy with simple partial seizures ([place]) 08/21/2015  . Nontraumatic cortical hemorrhage of cerebral hemisphere ([place])   . Seizure ([place])   . Seizures ([place])   . Adjustment disorder with depressed mood   . Contracture of muscle ankle and foot 06/11/2015  . Hemiplga fol ntrm intcrbl hemor aff right dominant side 06/06/2015  . Left-sided intracerebral hemorrhage ([place]) 06/04/2015  . Seizure disorder as sequela of  cerebrovascular accident ([place])   . Seizure disorder ([place]) 06/03/2015  . Sepsis ([place]) 06/03/2015  . UTI (urinary tract infection) 06/03/2015  . Hypotension 06/02/2015  . Right spastic hemiparesis ([place]) 05/28/2015  . 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 History of anxiety disorder 05/28/2015  . ICH (intracerebral hemorrhage) ([place])   . Seizure disorder, nonconvulsive, with status epilepticus ([place])   . Cerebral venous thrombosis of cortical vein   . Cytotoxic cerebral edema ([place])   . IVH (intraventricular hemorrhage) ([place])   . Term pregnancy 05/07/2015  . Spontaneous vaginal delivery 05/07/2015    Allyson Tineo L 09/04/2015, 1:22 PM  [facility] [address] [place], [place], [address] Phone: [phone]   Fax:  [phone]  Name: Nyasha Rahilly MRN: [id number] Date of Birth: 11/25/1975    Geoffry Paradise, PT,DPT 09/04/2015 1:22 PM Phone: [phone] Fax: [phone]

## 2015-09-04 NOTE — Patient Instructions (Signed)
Piriformis Stretch, Supine    Lie on back and bring right ankle to left knee. Keep left foot flat on bed/mat, gently push top knee away from body.  Hold _30__ seconds.  Repeat _3__ times per session. Take 30 second rest break in between each rep. Do _2-3__ sessions per day.  Copyright  VHI. All rights reserved.    Lower Trunk Rotation Stretch    Keeping back flat and feet together, rotate knees to left side, while turning head to the right side. Hold __30__ seconds. Then bring legs to the right side and look to the left side. Repeat __3__ times per set. Do __1__ sets per session. Do _2___ sessions per day.  http://orth.exer.us/123   Copyright  VHI. All rights reserved.

## 2015-09-04 NOTE — Therapy (Signed)
[facility] [address] [place], [place], [address] Phone: [phone]   Fax:  [phone]  Occupational Therapy Treatment  Patient Details  Name: Ashley [name] MRN: [id number] Date of Birth: [birth year] No Data Recorded  Encounter Date: 09/04/2015      OT End of Session - 09/04/15 1116    Visit Number 19   Number of Visits 34   Date for OT Re-Evaluation 10/27/15   Authorization Type BCBS   Authorization Time Period 30 visit OT- week 2/8   Authorization - Visit Number 20   Authorization - Number of Visits 30   OT Start Time 0848   OT Stop Time 0930   OT Time Calculation (min) 42 min   Activity Tolerance Patient tolerated treatment well   Behavior During Therapy [facility] for tasks assessed/performed      Past Medical History  Diagnosis Date  . Anxiety   . ICH (intracerebral hemorrhage) ([place])   . Stroke [facility])     Past Surgical History  Procedure Laterality Date  . Dilation and curettage of uterus      There were no vitals filed for this visit.  Visit Diagnosis:  Right spastic hemiparesis ([place])  Pain in joint of right shoulder      Subjective Assessment - 09/04/15 0958    Subjective  Pt reports she is afraid to use her arm that with will hurt   Pertinent History see epic snapshot; pt with [facility] 2 weeks after deliverying her 3rd child   Patient Stated Goals to be able to use my R arm normally   Currently in Pain? Yes   Pain Score 2    Pain Location Arm   Pain Orientation Right   Pain Descriptors / Indicators Aching   Pain Type Acute pain   Pain Onset More than a month ago   Pain Frequency Intermittent   Aggravating Factors  malpositioning, shoulder flexion   Pain Relieving Factors stretching   Multiple Pain Sites No        Treatment: Neuro re-ed supine for chest press and shoulder flexion with ball, min facilitation, quadraped  for cat/ cow,weightbearing in standing through bilateral UE's  while  rocking forwards and backwards and side to side for increased weightbearing through RLE. then followed by UE Ranger for shoulder flexion and circumduction with therapist facilitating incr.weight bearing through RLE, min -mod facilitation. Open chain reaching with RUE mid range to place graded clothes pins on target, min facilitation/ v.c. to avoid compensatory shoulder hike. Pt reported arm felt better at end of session and therapist encouraged pt to use RUE at home for light activities.                        OT Short Term Goals - 08/28/15 1926    OT SHORT TERM GOAL #1   Title I with HEP.   Status Achieved   OT SHORT TERM GOAL #2   Title Pt will demonstrate impoved fine motor coordinationas evidenced by decreasing RUE 9 hole peg test score to 35 secs or less.   Baseline 28.44 secs   Status Achieved   OT SHORT TERM GOAL #3   Title Pt will perform all basic ADLs at a supervision level.   Status Achieved   OT SHORT TERM GOAL #4   Title Pt will use RUE as an active assist for ADLS/IADLS at least 50 % of the time.   Status Achieved   OT SHORT  TERM GOAL #5   Title Pt will perform basic home management/ cooking at min A level.   Status Achieved   Additional Short Term Goals   Additional Short Term Goals Yes   OT SHORT TERM GOAL #6   Title Pt will perform home management/ cooking at a supervision level consistently.   Baseline due 09/27/15   Time 4   Period Weeks   Status New   OT SHORT TERM GOAL #7   Title Pt will demonstrate ability to retrieve a lightweight object at 95* with pain less than or equal to 3/10.   Time 4   Period Weeks   Status New           OT Long Term Goals - 08/28/15 1929    OT LONG TERM GOAL #1   Title Pt will perfom all basic ADLS modified independently. due 10/27/15   Baseline min A with washing hair, difficulty with hygeine    Time 8   Period Weeks   Status On-going   OT LONG TERM GOAL #2   Title Pt will perform basic home management/  cooking at a supervision level demonstrating good safety awareness.- not met (now STG)   Baseline revised long term goal- modified independent with cooking and home management with pt demonstrating good safety awareness   Time 8   Period Weeks   Status On-going   OT LONG TERM GOAL #3   Title Pt will demonstrate ability to retrieve a 3 lbs weight at 120 x 5 reps without dropping with RUE   Baseline goal deferred due to0 shoulder pain   Time 8   Period Weeks   Status Not Met   OT LONG TERM GOAL #4   Title Pt will resume use of RUE as dominant hand at least 75% of the time for ADLs/ IADLs. not met 30% goal revised   Baseline revised goal- Pt will use RUE as an active assist 75% of the time    Time 8   Status On-going   OT LONG TERM GOAL #5   Title Pt will demonstrate ability to perform a physical and cognitive task simultaneously with 80% or better accuracy.   Baseline not fully addressed due to recent shoulder pain-    Time 8   Status Deferred   Long Term Additional Goals   Additional Long Term Goals Yes   OT LONG TERM GOAL #6   Title Pt will demonstrate improved fine motor coordination as evidenced by decreasing RUE 9 hole peg test score to 30 secs or less.   Baseline 25.47 secs   Time 8   Status Achieved               Plan - 09/04/15 1002    Clinical Impression Statement Pt is progressing towards goals with decreased pain today. Pt continues to demonstrate compensatory shoulder hiking at times  and anticipation of pain which limits movement   Pt will benefit from skilled therapeutic intervention in order to improve on the following deficits (Retired) Abnormal gait;Decreased coordination;Decreased range of motion;Difficulty walking;Impaired flexibility;Decreased endurance;Decreased safety awareness;Increased edema;Impaired tone;Decreased knowledge of precautions;Decreased activity tolerance;Decreased balance;Decreased knowledge of use of DME;Pain;Impaired UE functional  use;Impaired vision/preception;Decreased cognition;Decreased mobility;Decreased strength   Rehab Potential Good   OT Frequency 2x / week   OT Duration 8 weeks   OT Treatment/Interventions Self-care/ADL training;Therapeutic exercise;Patient/family education;Balance training;Ultrasound;Neuromuscular education;Manual Therapy;Splinting;Therapeutic exercises;Energy conservation;Parrafin;DME and/or AE instruction;Therapeutic activities;Cognitive remediation/compensation;Gait Training;Fluidtherapy;Electrical Stimulation;Moist Heat;Contrast Bath;Passive range of motion;Visual/perceptual remediation/compensation   Plan Neuro  re-ed   Consulted and Agree with Plan of Care Patient;Family member/caregiver        Problem List Patient Active Problem List   Diagnosis Date Noted  . Adhesive capsulitis of right shoulder 08/24/2015  . Symptomatic partial epilepsy with simple partial seizures ([place]) 08/21/2015  . Nontraumatic cortical hemorrhage of cerebral hemisphere ([place])   . Seizure ([place])   . Seizures ([place])   . Adjustment disorder with depressed mood   . Contracture of muscle ankle and foot 06/11/2015  . Hemiplga fol ntrm intcrbl hemor aff right dominant side 06/06/2015  . Left-sided intracerebral hemorrhage ([place]) 06/04/2015  . Seizure disorder as sequela of cerebrovascular accident ([place])   . Seizure disorder ([place]) 06/03/2015  . Sepsis ([place]) 06/03/2015  . UTI (urinary tract infection) 06/03/2015  . Hypotension 06/02/2015  . Right spastic hemiparesis ([place]) 05/28/2015  . 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 History of anxiety disorder 05/28/2015  . ICH (intracerebral hemorrhage) ([place])   . Seizure disorder, nonconvulsive, with status epilepticus ([place])   . Cerebral venous thrombosis of cortical vein   . Cytotoxic cerebral edema ([place])   . IVH (intraventricular hemorrhage) ([place])   . Term pregnancy 05/07/2015  . Spontaneous vaginal delivery 05/07/2015     RINE,KATHRYN 09/04/2015, 11:18 AM Theone Murdoch, OTR/L [phone] Phone: [phone] 11:18 AM 09/04/2015 [facility] [address] [place], [place], [address] Phone: [phone]   Fax:  [phone]  Name: Ashley [name] MRN: [id number] Date of Birth: [birth year]

## 2015-09-11 ENCOUNTER — Ambulatory Visit: Payer: BLUE CROSS/BLUE SHIELD | Admitting: Speech Pathology

## 2015-09-11 ENCOUNTER — Ambulatory Visit: Payer: BLUE CROSS/BLUE SHIELD | Admitting: Occupational Therapy

## 2015-09-11 ENCOUNTER — Ambulatory Visit: Payer: BLUE CROSS/BLUE SHIELD

## 2015-09-11 DIAGNOSIS — IMO0002 Reserved for concepts with insufficient information to code with codable children: Secondary | ICD-10-CM

## 2015-09-11 DIAGNOSIS — R41841 Cognitive communication deficit: Secondary | ICD-10-CM | POA: Diagnosis not present

## 2015-09-11 DIAGNOSIS — G8111 Spastic hemiplegia affecting right dominant side: Secondary | ICD-10-CM

## 2015-09-11 DIAGNOSIS — R279 Unspecified lack of coordination: Principal | ICD-10-CM

## 2015-09-11 DIAGNOSIS — R269 Unspecified abnormalities of gait and mobility: Secondary | ICD-10-CM

## 2015-09-11 DIAGNOSIS — R278 Other lack of coordination: Secondary | ICD-10-CM

## 2015-09-11 DIAGNOSIS — M25511 Pain in right shoulder: Secondary | ICD-10-CM

## 2015-09-11 DIAGNOSIS — R29898 Other symptoms and signs involving the musculoskeletal system: Secondary | ICD-10-CM

## 2015-09-11 NOTE — Therapy (Signed)
[facility] [address] [place], [place], [address] Phone: [phone]   Fax:  [phone]  Speech Language Pathology Treatment  Patient Details  Name: Ashley [name] MRN: [id number] Date of Birth: 1976-08-09 Dr. Alysia Penna  Encounter Date: 09/11/2015      End of Session - 09/11/15 1354    SLP Start Time 2620   SLP Stop Time  1356   SLP Time Calculation (min) 39 min      Past Medical History  Diagnosis Date  . Anxiety   . ICH (intracerebral hemorrhage) ([place])   . Stroke [facility])     Past Surgical History  Procedure Laterality Date  . Dilation and curettage of uterus      There were no vitals filed for this visit.  Visit Diagnosis: Cognitive communication deficit      Subjective Assessment - 09/11/15 1320    Subjective "I haven't seen y'all in a week"               ADULT SLP TREATMENT - 09/11/15 1326    General Information   Behavior/Cognition Alert;Cooperative;Pleasant mood   Treatment Provided   Treatment provided Cognitive-Linquistic   Pain Assessment   Pain Assessment No/denies pain   Cognitive-Linquistic Treatment   Treatment focused on Cognition   Skilled Treatment Pt divided attention between complex card sort game and simple naming tasks with  100% on naming and 90% on complex card sort.  Pt verbalized that she feels she is at baseline cogntively. She reports carrying over compensations for any attention issues that may come from fatigue by doing complex tasks such as bill paying when she is most alert and when kids are at school to limit distractions.  Pt divided attention between complex deduction puzzle and simple conversation, competing puzzle in timely manner with 100% accuracy.    Assessment / Recommendations / Plan   Plan Discharge SLP treatment due to (comment)   Progression Toward Goals   Progression toward goals Goals met, education completed, patient discharged from [facility] Visits from Start of Care: 19  Current functional level related to goals / functional outcomes: See goals below   Remaining deficits: Divided attention may be reduced with fatigue    Education / Equipment: Compensations for attention, cognitive activities to do at home Plan: Patient agrees to discharge.  Patient goals were met. Patient is being discharged due to meeting the stated rehab goals.  ?????           SLP Short Term Goals - 09/11/15 1343    SLP SHORT TERM GOAL #1   Title pt will alternate attention between simple cognitive-linguistic tasks with 85% success and WFL switch time over 3 sessions   Period --  or 12 visits, for all STGs   Status Achieved   SLP SHORT TERM GOAL #2   Title pt will demo verbal safety awareness related to deficits   Status Achieved   SLP SHORT TERM GOAL #3   Title pt will complete mod complex time, money, sequencing, calendar problems 95% with modified independence (self-correction)   Status Achieved   SLP SHORT TERM GOAL #4   Title pt will demo divided attention in two simple tasks 80% of the time with rare min A   Time 1   Period Weeks   Status Not Met          SLP Long Term Goals - 09/11/15 1343  SLP LONG TERM GOAL #1   Title pt will divide attention between one simple task and one mod complex cognitive linguistic task 90% each task   Time 2   Period Weeks   Status Achieved   SLP LONG TERM GOAL #2   Title pt will demo anticipatory awareness by using compensatory measures for memory and/or decr'd attention x1 in a session over 4 sessions   Time 2   Period Weeks   Status Achieved   SLP LONG TERM GOAL #3   Title pt will demo executive function in a simple task with rare min A   Time 2   Period Weeks   Status Achieved          Plan - 09/11/15 1347    Clinical Impression Statement Pt has met long term goals. She is using compensations for attention as needed at home. Pt agrees  that her divided attention is back to baseline. D/c skilled ST at this time, pt in agreement   Treatment/Interventions Cognitive reorganization;SLP instruction and feedback;Compensatory strategies;Internal/external aids;Environmental controls;Patient/family education;Functional tasks;Cueing hierarchy;Language facilitation   Potential to Achieve Goals Good        Problem List Patient Active Problem List   Diagnosis Date Noted  . Adhesive capsulitis of right shoulder 08/24/2015  . Symptomatic partial epilepsy with simple partial seizures ([place]) 08/21/2015  . Nontraumatic cortical hemorrhage of cerebral hemisphere ([place])   . Seizure ([place])   . Seizures ([place])   . Adjustment disorder with depressed mood   . Contracture of muscle ankle and foot 06/11/2015  . Hemiplga fol ntrm intcrbl hemor aff right dominant side 06/06/2015  . Left-sided intracerebral hemorrhage ([place]) 06/04/2015  . Seizure disorder as sequela of cerebrovascular accident ([place])   . Seizure disorder ([place]) 06/03/2015  . Sepsis ([place]) 06/03/2015  . UTI (urinary tract infection) 06/03/2015  . Hypotension 06/02/2015  . Right spastic hemiparesis ([place]) 05/28/2015  . 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 History of anxiety disorder 05/28/2015  . ICH (intracerebral hemorrhage) ([place])   . Seizure disorder, nonconvulsive, with status epilepticus ([place])   . Cerebral venous thrombosis of cortical vein   . Cytotoxic cerebral edema ([place])   . IVH (intraventricular hemorrhage) ([place])   . Term pregnancy 05/07/2015  . Spontaneous vaginal delivery 05/07/2015    L[place], Annye Rusk MS, CCC-SLP 09/11/2015, 1:58 PM  [facility] [address] [place], [place], [address] Phone: [phone]   Fax:  [phone]   Name: Ashley [name] MRN: [id number] Date of Birth: May 22, 1976

## 2015-09-11 NOTE — Therapy (Signed)
[facility] [address] [place], [place], [address] Phone: [phone]   Fax:  [phone]  Physical Therapy Treatment  Patient Details  Name: Ashley [name] MRN: [id number] Date of Birth: 01-30-1976 No Data Recorded  Encounter Date: 09/11/2015      PT End of Session - 09/11/15 1528    Visit Number 21   Number of Visits 25   Date for PT Re-Evaluation 09/26/15   Authorization Type BC[employer]. Per Ashley [name], pt gets 30 visits for PT and 30 for OT.   Authorization - Visit Number 21   Authorization - Number of Visits 30   PT Start Time 1401   PT Stop Time 1444   PT Time Calculation (min) 43 min   Equipment Utilized During Treatment Gait belt  harness on BWSTT   Activity Tolerance Patient tolerated treatment well   Behavior During Therapy WFL for tasks assessed/performed      Past Medical History  Diagnosis Date  . Anxiety   . ICH (intracerebral hemorrhage) ([place])   . Stroke [facility])     Past Surgical History  Procedure Laterality Date  . Dilation and curettage of uterus      There were no vitals filed for this visit.  Visit Diagnosis:  Abnormality of gait  Right leg weakness  Right spastic hemiparesis (HCC)      Subjective Assessment - 09/11/15 1401    Subjective Pt denied falls or changes since last visit. Pt reported she feels a little less sleepy after incr. dosage of Prozac. Pt reported MD feels pt would benefit from botox injection in R LE.    Pertinent History Seizures, low BP   Patient Stated Goals "Be as close to normal as much as possible and to go back to running" Get back to taking care of my kids, 90 weeks old and 57 and 76 years old   Currently in Pain? No/denies        Therex: Pt performed in supine:  R piriformis stretch 2x30sec. Hold. B LTR stretch x30 sec. hold. Pt required rest break 2/2 incr. R UE pain/spasm. Sidelying on L side with towel placed under L side to stretch R lateral trunk  with 2 minute hold.                   [facility] Adult PT Treatment/Exercise - 09/11/15 1526    Ambulation/Gait   Ambulation/Gait Yes   Ambulation/Gait Assistance 5: Supervision   Ambulation/Gait Assistance Details Pt performed BWSTT without AFO donned for 8 minutes. Pt amb. 39' with R AFO donned and 200' with AFO doffed after BWSTT. Pt cues to improve lateral wt. shifting to R side and to improve L heel strike and R knee flexion, which was noted to improve after BWSTT.    Ambulation Distance (Feet) --  30', 200', and treadmill (BWSTT)   Assistive device Straight cane;Body weight support system   Gait Pattern Decreased stance time - right;Decreased hip/knee flexion - right;Decreased dorsiflexion - right;Decreased weight shift to right;Decreased arm swing - right;Trunk rotated posteriorly on right;Decreased step length - left   Ambulation Surface Level;Indoor                  PT Short Term Goals - 08/23/15 1203    PT SHORT TERM GOAL #1   Title Pt will be IND in HEP to improve strength, balance, and endurance. Target date: 07/27/15.   Status Partially Met   PT SHORT TERM GOAL #2  Title Pt will improve gait speed to >/=1.46f/sec to decrease falls risk. Target date: 07/27/15.   Status Achieved   PT SHORT TERM GOAL #3   Title Pt will ambulate 300' with LRAD, over even terrain, at MOD I level to improve functional mobility. Target date: 07/27/15.   Status Achieved   PT SHORT TERM GOAL #4   Title Pt will perform TUG in </=13.5 seconds with LRAD to decr. falls risk. Target date: 07/27/15.   Status Partially Met   PT SHORT TERM GOAL #5   Title Perform BERG and write STG and LTG as appropriate. Target date: 07/27/15.   Status Achieved   PT SHORT TERM GOAL #6   Title Pt will improve BERG to >/=51/56 to decr. falls risk. Target date: 07/27/15.   Status Achieved           PT Long Term Goals - 08/28/15 1628    PT LONG TERM GOAL #1   Title Pt will ambulate 1000' over  even/uneven terrain, with LRAD, at MOD I level to improve functional mobiliity. Target date: 09/25/15.   Baseline deferred to new 4 week POC. All unmet goals will be carried over to new 4 week POC: 09/25/15.   Status On-going   PT LONG TERM GOAL #2   Title Pt will amb. 300' over even terrain without an AD, IND, to safely amb. in home. Target date: 09/25/15.   Status On-going   PT LONG TERM GOAL #3   Title Pt will improve gait speed to >/=2.650fsec, with LRAD, to amb. safely in the community. Target date: 09/25/15.   Status On-going   PT LONG TERM GOAL #4   Title Pt will verbalize plans to join a fitness center upon d/c from PT to continue to maintain strength and endurance gains made during PT. Target date: 09/25/15.   Status On-going   PT LONG TERM GOAL #5   Title Pt improve BERG score to >/=55/56 to decr. falls risk. Target date: 09/25/15.   Status On-going               Plan - 09/11/15 1529    Clinical Impression Statement Pt demonstrated progress, as she was able to improve R lateral wt. shifting and L heel strike after BWSTT. Pt continues to require cues to decr. R UE flexion during gait and to decr. shoulder shrug, which improved when pt performed stretches prior to gait training.   Pt will benefit from skilled therapeutic intervention in order to improve on the following deficits Abnormal gait;Decreased endurance;Impaired sensation;Decreased knowledge of precautions;Decreased activity tolerance;Decreased knowledge of use of DME;Decreased strength;Impaired UE functional use;Impaired tone;Decreased balance;Decreased mobility;Decreased cognition;Decreased range of motion;Decreased safety awareness;Decreased coordination;Impaired flexibility   Rehab Potential Good   Clinical Impairments Affecting Rehab Potential Seizures which may limit intensity of PT   PT Frequency 2x / week   PT Duration 8 weeks   PT Treatment/Interventions ADLs/Self Care Home Management;Neuromuscular  re-education;Cognitive remediation;Biofeedback;DME Instruction;Gait training;Stair training;Canalith Repostioning;Patient/family education;Orthotic Fit/Training;Balance training;Therapeutic exercise;Manual techniques;Therapeutic activities;Vestibular   PT Next Visit Plan Stretches and Body weight support treadmill training. Trial shoe lift insert in L shoe. Trial R foot-up brace again   PT Home Exercise Plan Stretching/strength/balance HEP   Consulted and Agree with Plan of Care Patient   Family Member Consulted pt's husband-[name]        Problem List Patient Active Problem List   Diagnosis Date Noted  . Adhesive capsulitis of right shoulder 08/24/2015  . Symptomatic partial epilepsy with simple partial seizures ([place]  08/21/2015  . Nontraumatic cortical hemorrhage of cerebral hemisphere ([place])   . Seizure ([place])   . Seizures ([place])   . Adjustment disorder with depressed mood   . Contracture of muscle ankle and foot 06/11/2015  . Hemiplga fol ntrm intcrbl hemor aff right dominant side 06/06/2015  . Left-sided intracerebral hemorrhage ([place]) 06/04/2015  . Seizure disorder as sequela of cerebrovascular accident ([place])   . Seizure disorder ([place]) 06/03/2015  . Sepsis ([place]) 06/03/2015  . UTI (urinary tract infection) 06/03/2015  . Hypotension 06/02/2015  . Right spastic hemiparesis ([place]) 05/28/2015  . 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 History of anxiety disorder 05/28/2015  . ICH (intracerebral hemorrhage) ([place])   . Seizure disorder, nonconvulsive, with status epilepticus ([place])   . Cerebral venous thrombosis of cortical vein   . Cytotoxic cerebral edema ([place])   . IVH (intraventricular hemorrhage) ([place])   . Term pregnancy 05/07/2015  . Spontaneous vaginal delivery 05/07/2015    Herb Beltre L 09/11/2015, 3:32 PM  [facility] [address] [place], [place], [address] Phone: [phone]    Fax:  [phone]  Name: Ashley [name] MRN: [id number] Date of Birth: 1976-01-12   Geoffry Paradise, PT,DPT 09/11/2015 3:32 PM Phone: [phone] Fax: [phone]

## 2015-09-12 NOTE — Therapy (Signed)
[facility] [address] [place], [place], [address] Phone: [phone]   Fax:  [phone]  Occupational Therapy Treatment  Patient Details  Name: Ashley [name] MRN: [id number] Date of Birth: Mar 15, 1976 No Data Recorded  Encounter Date: 09/11/2015      OT End of Session - 09/12/15 1347    Visit Number 20   Number of Visits 34   Authorization Type BCBS   Authorization Time Period 30 visit OT- week 2/8   Authorization - Visit Number 20   Authorization - Number of Visits 30   OT Start Time 6962   OT Stop Time 1615   OT Time Calculation (min) 41 min   Activity Tolerance Patient tolerated treatment well   Behavior During Therapy [facility] for tasks assessed/performed      Past Medical History  Diagnosis Date  . Anxiety   . ICH (intracerebral hemorrhage) ([place])   . Stroke [facility])     Past Surgical History  Procedure Laterality Date  . Dilation and curettage of uterus      There were no vitals filed for this visit.  Visit Diagnosis:  Decreased coordination  Weakness due to cerebrovascular accident  Pain in joint of right shoulder  Right spastic hemiparesis ([place])      Subjective Assessment - 09/12/15 1344    Pertinent History see epic snapshot; pt with [facility] 2 weeks after deliverying her 3rd child   Patient Stated Goals to be able to use my R arm normally   Currently in Pain? Yes   Pain Score 2    Pain Location Arm   Pain Orientation Right   Pain Descriptors / Indicators Aching   Pain Type Acute pain   Pain Frequency Intermittent   Aggravating Factors  malposisitioning, shoulder pain   Pain Relieving Factors stretching, proper posisitioning      Treatment: supine closed chain chest press and shoulder flexion with PVC pipe frame with therapsit facilitating shoulder / scapula followed seated performing anterio/ posetior tils with upper trunk rotation, while weight bearing through bilateral UE's to one  side(pt. Repeated toe each side 5-10 reps.)Standing UE Ranger for mid range shoulder flexion and circumduction with therapist facilitating weightbearing through RLE and facilitated sacpula. Pt demonstrates improved RUE performance when she has more even weightbearing through bilateral LE's. Open chain reaching midlevel with mod facilitation at trunk, and for weightbearing through RLE. Wall pushups x0 reps, min facilitation. Finger walking to mid range, min v.c. To avoid compensation. Pt reports receiving private pay PT at a different site and performing pulleys there. Therapist recommended that pt discontinue pulleys due to risk for injure and likely contributing to shoulder pain. Therpist also recommended pt considering stopping the private therapy PT for a while as they may be performing tasks that are contributing to shoulder pain.                          OT Short Term Goals - 08/28/15 1926    OT SHORT TERM GOAL #1   Title I with HEP.   Status Achieved   OT SHORT TERM GOAL #2   Title Pt will demonstrate impoved fine motor coordinationas evidenced by decreasing RUE 9 hole peg test score to 35 secs or less.   Baseline 28.44 secs   Status Achieved   OT SHORT TERM GOAL #3   Title Pt will perform all basic ADLs at a supervision level.   Status Achieved   OT  SHORT TERM GOAL #4   Title Pt will use RUE as an active assist for ADLS/IADLS at least 50 % of the time.   Status Achieved   OT SHORT TERM GOAL #5   Title Pt will perform basic home management/ cooking at min A level.   Status Achieved   Additional Short Term Goals   Additional Short Term Goals Yes   OT SHORT TERM GOAL #6   Title Pt will perform home management/ cooking at a supervision level consistently.   Baseline due 09/27/15   Time 4   Period Weeks   Status New   OT SHORT TERM GOAL #7   Title Pt will demonstrate ability to retrieve a lightweight object at 95* with pain less than or equal to 3/10.   Time 4    Period Weeks   Status New           OT Long Term Goals - 08/28/15 1929    OT LONG TERM GOAL #1   Title Pt will perfom all basic ADLS modified independently. due 10/27/15   Baseline min A with washing hair, difficulty with hygeine    Time 8   Period Weeks   Status On-going   OT LONG TERM GOAL #2   Title Pt will perform basic home management/ cooking at a supervision level demonstrating good safety awareness.- not met (now STG)   Baseline revised long term goal- modified independent with cooking and home management with pt demonstrating good safety awareness   Time 8   Period Weeks   Status On-going   OT LONG TERM GOAL #3   Title Pt will demonstrate ability to retrieve a 3 lbs weight at 120 x 5 reps without dropping with RUE   Baseline goal deferred due to0 shoulder pain   Time 8   Period Weeks   Status Not Met   OT LONG TERM GOAL #4   Title Pt will resume use of RUE as dominant hand at least 75% of the time for ADLs/ IADLs. not met 30% goal revised   Baseline revised goal- Pt will use RUE as an active assist 75% of the time    Time 8   Status On-going   OT LONG TERM GOAL #5   Title Pt will demonstrate ability to perform a physical and cognitive task simultaneously with 80% or better accuracy.   Baseline not fully addressed due to recent shoulder pain-    Time 8   Status Deferred   Long Term Additional Goals   Additional Long Term Goals Yes   OT LONG TERM GOAL #6   Title Pt will demonstrate improved fine motor coordination as evidenced by decreasing RUE 9 hole peg test score to 30 secs or less.   Baseline 25.47 secs   Time 8   Status Achieved               Plan - 09/12/15 1345    Clinical Impression Statement Pt is progressing towards goals slowly limited by pain, spaticity and compensatory patterns   Pt will benefit from skilled therapeutic intervention in order to improve on the following deficits (Retired) Abnormal gait;Decreased coordination;Decreased range of  motion;Difficulty walking;Impaired flexibility;Decreased endurance;Decreased safety awareness;Increased edema;Impaired tone;Decreased knowledge of precautions;Decreased activity tolerance;Decreased balance;Decreased knowledge of use of DME;Pain;Impaired UE functional use;Impaired vision/preception;Decreased cognition;Decreased mobility;Decreased strength   Rehab Potential Good   OT Frequency 2x / week   OT Duration 8 weeks   OT Treatment/Interventions Self-care/ADL training;Therapeutic exercise;Patient/family education;Balance training;Ultrasound;Neuromuscular education;Manual Therapy;Splinting;Therapeutic  exercises;Energy conservation;Parrafin;DME and/or AE instruction;Therapeutic activities;Cognitive remediation/compensation;Gait Training;Fluidtherapy;Electrical Stimulation;Moist Heat;Contrast Bath;Passive range of motion;Visual/perceptual remediation/compensation   Plan neuro re-ed   OT Home Exercise Plan issued: ball ex in supine, trunk for A-p tilts with trunk rotation(07/05/15)   Consulted and Agree with Plan of Care Patient        Problem List Patient Active Problem List   Diagnosis Date Noted  . Adhesive capsulitis of right shoulder 08/24/2015  . Symptomatic partial epilepsy with simple partial seizures ([place]) 08/21/2015  . Nontraumatic cortical hemorrhage of cerebral hemisphere ([place])   . Seizure ([place])   . Seizures ([place])   . Adjustment disorder with depressed mood   . Contracture of muscle ankle and foot 06/11/2015  . Hemiplga fol ntrm intcrbl hemor aff right dominant side 06/06/2015  . Left-sided intracerebral hemorrhage ([place]) 06/04/2015  . Seizure disorder as sequela of cerebrovascular accident ([place])   . Seizure disorder ([place]) 06/03/2015  . Sepsis ([place]) 06/03/2015  . UTI (urinary tract infection) 06/03/2015  . Hypotension 06/02/2015  . Right spastic hemiparesis ([place]) 05/28/2015  . 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 History of anxiety disorder  05/28/2015  . ICH (intracerebral hemorrhage) ([place])   . Seizure disorder, nonconvulsive, with status epilepticus ([place])   . Cerebral venous thrombosis of cortical vein   . Cytotoxic cerebral edema ([place])   . IVH (intraventricular hemorrhage) ([place])   . Term pregnancy 05/07/2015  . Spontaneous vaginal delivery 05/07/2015    Maxfield Gildersleeve 09/12/2015, 1:49 PM Theone Murdoch, OTR/L [phone] Phone: [phone] 1:49 PM 09/12/2015 [facility] [address] [place], [place], [address] Phone: [phone]   Fax:  [phone]  Name: Ashley [name] MRN: [id number] Date of Birth: 04-18-1976

## 2015-09-13 ENCOUNTER — Ambulatory Visit: Payer: BLUE CROSS/BLUE SHIELD | Admitting: Physical Medicine & Rehabilitation

## 2015-09-13 ENCOUNTER — Encounter: Payer: BLUE CROSS/BLUE SHIELD | Attending: Physical Medicine & Rehabilitation

## 2015-09-13 ENCOUNTER — Ambulatory Visit (HOSPITAL_BASED_OUTPATIENT_CLINIC_OR_DEPARTMENT_OTHER): Payer: BLUE CROSS/BLUE SHIELD | Admitting: Physical Medicine & Rehabilitation

## 2015-09-13 ENCOUNTER — Encounter: Payer: Self-pay | Admitting: Physical Medicine & Rehabilitation

## 2015-09-13 VITALS — BP 94/56 | HR 67 | Resp 14

## 2015-09-13 DIAGNOSIS — F419 Anxiety disorder, unspecified: Secondary | ICD-10-CM | POA: Insufficient documentation

## 2015-09-13 DIAGNOSIS — I69120 Aphasia following nontraumatic intracerebral hemorrhage: Secondary | ICD-10-CM | POA: Insufficient documentation

## 2015-09-13 DIAGNOSIS — G8111 Spastic hemiplegia affecting right dominant side: Secondary | ICD-10-CM | POA: Diagnosis not present

## 2015-09-13 DIAGNOSIS — G8110 Spastic hemiplegia affecting unspecified side: Secondary | ICD-10-CM

## 2015-09-13 DIAGNOSIS — Z79899 Other long term (current) drug therapy: Secondary | ICD-10-CM | POA: Diagnosis not present

## 2015-09-13 DIAGNOSIS — I69151 Hemiplegia and hemiparesis following nontraumatic intracerebral hemorrhage affecting right dominant side: Secondary | ICD-10-CM | POA: Insufficient documentation

## 2015-09-13 NOTE — Patient Instructions (Signed)

## 2015-09-13 NOTE — Progress Notes (Signed)
Botox Injection for spasticity using needle EMG guidance  Dilution: 50 Units/ml Indication: Severe spasticity which interferes with ADL,mobility and/or  hygiene and is unresponsive to medication management and other conservative care Informed consent was obtained after describing risks and benefits of the procedure with the patient. This includes bleeding, bruising, infection, excessive weakness, or medication side effects. A REMS form is on file and signed. Needle: 25g 1" needle electrode Number of units per muscle Gastrosoleus 25 units medial and 25 units lateral Soleus 25 units 3 Posterior tibialis 25 units 2 Flexor digitorum longus 25 units 1    All injections were done after obtaining appropriate EMG activity and after negative drawback for blood. The patient tolerated the procedure well. Post procedure instructions were given. A followup appointment was made.

## 2015-09-14 ENCOUNTER — Ambulatory Visit: Payer: BLUE CROSS/BLUE SHIELD | Attending: Physical Medicine & Rehabilitation

## 2015-09-14 ENCOUNTER — Ambulatory Visit: Payer: BLUE CROSS/BLUE SHIELD | Admitting: Occupational Therapy

## 2015-09-14 DIAGNOSIS — G8111 Spastic hemiplegia affecting right dominant side: Secondary | ICD-10-CM

## 2015-09-14 DIAGNOSIS — M25511 Pain in right shoulder: Secondary | ICD-10-CM | POA: Insufficient documentation

## 2015-09-14 DIAGNOSIS — R278 Other lack of coordination: Secondary | ICD-10-CM | POA: Diagnosis present

## 2015-09-14 DIAGNOSIS — I69898 Other sequelae of other cerebrovascular disease: Secondary | ICD-10-CM | POA: Diagnosis present

## 2015-09-14 DIAGNOSIS — R279 Unspecified lack of coordination: Secondary | ICD-10-CM

## 2015-09-14 DIAGNOSIS — R531 Weakness: Secondary | ICD-10-CM | POA: Diagnosis present

## 2015-09-14 DIAGNOSIS — IMO0002 Reserved for concepts with insufficient information to code with codable children: Secondary | ICD-10-CM

## 2015-09-14 DIAGNOSIS — R258 Other abnormal involuntary movements: Secondary | ICD-10-CM | POA: Diagnosis present

## 2015-09-14 DIAGNOSIS — R269 Unspecified abnormalities of gait and mobility: Secondary | ICD-10-CM | POA: Diagnosis present

## 2015-09-14 DIAGNOSIS — R29898 Other symptoms and signs involving the musculoskeletal system: Secondary | ICD-10-CM | POA: Diagnosis present

## 2015-09-14 NOTE — Patient Instructions (Signed)
With 2 pillows under your head roll towards right arm, so that you are on your side and away gently 5x  Lean down on your right elbow while seated at edge of bed,  Move your trunk away from elbow and back 5x Perform 2x day

## 2015-09-14 NOTE — Therapy (Signed)
[facility] [address] [place], [place], [address] Phone: [phone]   Fax:  [phone]  Occupational Therapy Treatment  Patient Details  Name: Ashley [name] MRN: [id number] Date of Birth: 1976-03-03 No Data Recorded  Encounter Date: 09/14/2015      OT End of Session - 09/14/15 1705    Visit Number 21   Date for OT Re-Evaluation 10/27/15   Authorization Type [employer]   Authorization Time Period 30 visit OT- week 2/8   Authorization - Visit Number 21   Authorization - Number of Visits 30   OT Start Time 1450   OT Stop Time 1540   OT Time Calculation (min) 50 min   Activity Tolerance Patient tolerated treatment well   Behavior During Therapy [facility] for tasks assessed/performed      Past Medical History  Diagnosis Date  . Anxiety   . ICH (intracerebral hemorrhage) ([place])   . Stroke [facility])     Past Surgical History  Procedure Laterality Date  . Dilation and curettage of uterus      There were no vitals filed for this visit.  Visit Diagnosis:  Pain in joint of right shoulder  Right spastic hemiparesis (HCC)  Weakness due to cerebrovascular accident  Decreased coordination      Subjective Assessment - 09/14/15 1614    Pertinent History see epic snapshot; pt with [facility] 2 weeks after deliverying her 3rd child   Patient Stated Goals to be able to use my R arm normally   Currently in Pain? No/denies          Treatment: Neuro muscular re-ed in supine with emphasis on weightbearing through RUE in supine, followed by sidelying and then  sitting. Therapist facilitated shoulder and scapular positioning, for increased activation of scapula during functional body on RUE movements. Pt was painfree during session and demonstrated decreased RUE spasticity and ability to perform low range functional reach at end of session with less compensation. Therapist issued updated HEP see pt instructions, pt verbalized  understanding..                      OT Short Term Goals - 08/28/15 1926    OT SHORT TERM GOAL #1   Title I with HEP.   Status Achieved   OT SHORT TERM GOAL #2   Title Pt will demonstrate impoved fine motor coordinationas evidenced by decreasing RUE 9 hole peg test score to 35 secs or less.   Baseline 28.44 secs   Status Achieved   OT SHORT TERM GOAL #3   Title Pt will perform all basic ADLs at a supervision level.   Status Achieved   OT SHORT TERM GOAL #4   Title Pt will use RUE as an active assist for ADLS/IADLS at least 50 % of the time.   Status Achieved   OT SHORT TERM GOAL #5   Title Pt will perform basic home management/ cooking at min A level.   Status Achieved   Additional Short Term Goals   Additional Short Term Goals Yes   OT SHORT TERM GOAL #6   Title Pt will perform home management/ cooking at a supervision level consistently.   Baseline due 09/27/15   Time 4   Period Weeks   Status New   OT SHORT TERM GOAL #7   Title Pt will demonstrate ability to retrieve a lightweight object at 95* with pain less than or equal to 3/10.   Time 4  Period Weeks   Status New           OT Long Term Goals - 08/28/15 1929    OT LONG TERM GOAL #1   Title Pt will perfom all basic ADLS modified independently. due 10/27/15   Baseline min A with washing hair, difficulty with hygeine    Time 8   Period Weeks   Status On-going   OT LONG TERM GOAL #2   Title Pt will perform basic home management/ cooking at a supervision level demonstrating good safety awareness.- not met (now STG)   Baseline revised long term goal- modified independent with cooking and home management with pt demonstrating good safety awareness   Time 8   Period Weeks   Status On-going   OT LONG TERM GOAL #3   Title Pt will demonstrate ability to retrieve a 3 lbs weight at 120 x 5 reps without dropping with RUE   Baseline goal deferred due to0 shoulder pain   Time 8   Period Weeks   Status  Not Met   OT LONG TERM GOAL #4   Title Pt will resume use of RUE as dominant hand at least 75% of the time for ADLs/ IADLs. not met 30% goal revised   Baseline revised goal- Pt will use RUE as an active assist 75% of the time    Time 8   Status On-going   OT LONG TERM GOAL #5   Title Pt will demonstrate ability to perform a physical and cognitive task simultaneously with 80% or better accuracy.   Baseline not fully addressed due to recent shoulder pain-    Time 8   Status Deferred   Long Term Additional Goals   Additional Long Term Goals Yes   OT LONG TERM GOAL #6   Title Pt will demonstrate improved fine motor coordination as evidenced by decreasing RUE 9 hole peg test score to 30 secs or less.   Baseline 25.47 secs   Time 8   Status Achieved               Plan - 09/14/15 1704    Clinical Impression Statement Pt is progressing towards goals. She had no pain and normal movment at end of session.   Pt will benefit from skilled therapeutic intervention in order to improve on the following deficits (Retired) Abnormal gait;Decreased coordination;Decreased range of motion;Difficulty walking;Impaired flexibility;Decreased endurance;Decreased safety awareness;Increased edema;Impaired tone;Decreased knowledge of precautions;Decreased activity tolerance;Decreased balance;Decreased knowledge of use of DME;Pain;Impaired UE functional use;Impaired vision/preception;Decreased cognition;Decreased mobility;Decreased strength   Rehab Potential Good   OT Frequency 2x / week   OT Duration 8 weeks   OT Treatment/Interventions Self-care/ADL training;Therapeutic exercise;Patient/family education;Balance training;Ultrasound;Neuromuscular education;Manual Therapy;Splinting;Therapeutic exercises;Energy conservation;Parrafin;DME and/or AE instruction;Therapeutic activities;Cognitive remediation/compensation;Gait Training;Fluidtherapy;Electrical Stimulation;Moist Heat;Contrast Bath;Passive range of  motion;Visual/perceptual remediation/compensation   Plan NMR   OT Home Exercise Plan issued: ball ex in supine, trunk for A-p tilts with trunk rotation(07/05/15)   Consulted and Agree with Plan of Care Patient        Problem List Patient Active Problem List   Diagnosis Date Noted  . Spastic hemiplegia and hemiparesis affecting dominant side ([place]) 09/13/2015  . Adhesive capsulitis of right shoulder 08/24/2015  . Symptomatic partial epilepsy with simple partial seizures ([place]) 08/21/2015  . Nontraumatic cortical hemorrhage of cerebral hemisphere ([place])   . Seizure ([place])   . Seizures ([place])   . Adjustment disorder with depressed mood   . Contracture of muscle ankle and foot 06/11/2015  . Hemiplga fol  ntrm intcrbl hemor aff right dominant side 06/06/2015  . Left-sided intracerebral hemorrhage ([place]) 06/04/2015  . Seizure disorder as sequela of cerebrovascular accident ([place])   . Seizure disorder ([place]) 06/03/2015  . Sepsis ([place]) 06/03/2015  . UTI (urinary tract infection) 06/03/2015  . Hypotension 06/02/2015  . Right spastic hemiparesis ([place]) 05/28/2015  . 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 History of anxiety disorder 05/28/2015  . ICH (intracerebral hemorrhage) ([place])   . Seizure disorder, nonconvulsive, with status epilepticus ([place])   . Cerebral venous thrombosis of cortical vein   . Cytotoxic cerebral edema ([place])   . IVH (intraventricular hemorrhage) ([place])   . Term pregnancy 05/07/2015  . Spontaneous vaginal delivery 05/07/2015    Ashley [name] 09/14/2015, 5:06 PM Theone Murdoch, OTR/L [phone] Phone: [phone] 5:06 PM 09/14/2015 [facility] [address] [place], [place], [address] Phone: [phone]   Fax:  [phone]  Name: Ashley [name] MRN: [id number] Date of Birth: 1976-05-17

## 2015-09-14 NOTE — Therapy (Signed)
[facility] [address] [place], [place], [address] Phone: [phone]   Fax:  [phone]  Physical Therapy Treatment  Patient Details  Name: Ashley [name] MRN: [id number] Date of Birth: Jan 07, 1976 No Data Recorded  Encounter Date: 09/14/2015      PT End of Session - 09/14/15 1506    Visit Number 22   Number of Visits 25   PT Start Time 5681   PT Stop Time 1447   PT Time Calculation (min) 43 min   Equipment Utilized During Treatment Gait belt   Activity Tolerance Patient tolerated treatment well   Behavior During Therapy [facility] for tasks assessed/performed      Past Medical History  Diagnosis Date  . Anxiety   . ICH (intracerebral hemorrhage) ([place])   . Stroke [facility])     Past Surgical History  Procedure Laterality Date  . Dilation and curettage of uterus      There were no vitals filed for this visit.  Visit Diagnosis:  Decreased coordination  Weakness due to cerebrovascular accident  Right spastic hemiparesis (HCC)  Abnormality of gait  Right leg weakness  Coordination impairment      Subjective Assessment - 09/14/15 1458    Subjective No falls, pain, or new complaints reported.  Pt. reports getting 6 botox shots in the medial calf and ankle region.     Patient Stated Goals "Be as close to normal as much as possible and to go back to running" Get back to taking care of my kids, 40 years old and 26 and 40 years old   Currently in Pain? No/denies   Pain Score 0-No pain   Multiple Pain Sites No     Functional activity with R wt. Shifting (to increase RLE activation and wt. Shifting during functional activity to increase safety): - sit <> stand with staggered foot position in front of mirror 2 x 5 reps; to increase activation of RLE and awareness of poor R wt. Bearing.  Therapeutic exercise (to increase LE flexibility and decrease R LE tone): - PROM R piriformis stretch  x 30 sec - PROM R hamstring  stretch  x 30 sec - PROM R knee to chest stretch  x 30 sec - Sitting modified piriformis stretch x 30 sec - L side-lying stretch to R trunk lateral flexion group with towel roll under stomach x 30 sec   .                      [facility] Adult PT Treatment/Exercise - 09/14/15 1500    Ambulation/Gait   Ambulation/Gait Yes   Ambulation/Gait Assistance 5: Supervision   Ambulation/Gait Assistance Details Pt. ambulated 51f x 8 trials with AFO and single point cane supervision; pt. continues to demo decreased wt. shift to the R side during ambulation with poor L heel strike and R hip/knee flexion with poor L foot clearance; tactile feedback provided at pelvis during following gait trials to facilitate wt. shifting to the R; with tactile feedback pt. demo'd improved L foot clearance.     Ambulation Distance (Feet) 50 Feet x8   Assistive device Straight cane   Gait Pattern Decreased stance time - right;Decreased hip/knee flexion - right;Decreased dorsiflexion - right;Decreased weight shift to right;Decreased arm swing - right;Trunk rotated posteriorly on right;Decreased step length - left   Ambulation Surface Level;Indoor                  PT Short Term Goals -  08/23/15 1203    PT SHORT TERM GOAL #1   Title Pt will be IND in HEP to improve strength, balance, and endurance. Target date: 07/27/15.   Status Partially Met   PT SHORT TERM GOAL #2   Title Pt will improve gait speed to >/=1.110f/sec to decrease falls risk. Target date: 07/27/15.   Status Achieved   PT SHORT TERM GOAL #3   Title Pt will ambulate 300' with LRAD, over even terrain, at MOD I level to improve functional mobility. Target date: 07/27/15.   Status Achieved   PT SHORT TERM GOAL #4   Title Pt will perform TUG in </=13.5 seconds with LRAD to decr. falls risk. Target date: 07/27/15.   Status Partially Met   PT SHORT TERM GOAL #5   Title Perform BERG and write STG and LTG as appropriate. Target date:  07/27/15.   Status Achieved   PT SHORT TERM GOAL #6   Title Pt will improve BERG to >/=51/56 to decr. falls risk. Target date: 07/27/15.   Status Achieved           PT Long Term Goals - 08/28/15 1628    PT LONG TERM GOAL #1   Title Pt will ambulate 1000' over even/uneven terrain, with LRAD, at MOD I level to improve functional mobiliity. Target date: 09/25/15.   Baseline deferred to new 4 week POC. All unmet goals will be carried over to new 4 week POC: 09/25/15.   Status On-going   PT LONG TERM GOAL #2   Title Pt will amb. 300' over even terrain without an AD, IND, to safely amb. in home. Target date: 09/25/15.   Status On-going   PT LONG TERM GOAL #3   Title Pt will improve gait speed to >/=2.679fsec, with LRAD, to amb. safely in the community. Target date: 09/25/15.   Status On-going   PT LONG TERM GOAL #4   Title Pt will verbalize plans to join a fitness center upon d/c from PT to continue to maintain strength and endurance gains made during PT. Target date: 09/25/15.   Status On-going   PT LONG TERM GOAL #5   Title Pt improve BERG score to >/=55/56 to decr. falls risk. Target date: 09/25/15.   Status On-going               Plan - 09/14/15 1507    Clinical Impression Statement Pt. progressing toward goals.  Skilled session focused on increasing lateral wt. shifting to the R during gait training, and increase RLE hip/knee flexion to improve foot clearance. Pt. verbalized / demo'd understanding and adherence to current HEP for LE flexibility.     Pt will benefit from skilled therapeutic intervention in order to improve on the following deficits Abnormal gait;Decreased endurance;Impaired sensation;Decreased knowledge of precautions;Decreased activity tolerance;Decreased knowledge of use of DME;Decreased strength;Impaired UE functional use;Impaired tone;Decreased balance;Decreased mobility;Decreased cognition;Decreased range of motion;Decreased safety awareness;Decreased  coordination;Impaired flexibility   Rehab Potential Good   Clinical Impairments Affecting Rehab Potential Seizures which may limit intensity of PT   PT Frequency 2x / week   PT Duration 8 weeks   PT Treatment/Interventions ADLs/Self Care Home Management;Neuromuscular re-education;Cognitive remediation;Biofeedback;DME Instruction;Gait training;Stair training;Canalith Repostioning;Patient/family education;Orthotic Fit/Training;Balance training;Therapeutic exercise;Manual techniques;Therapeutic activities;Vestibular   PT Next Visit Plan Stretches and Body weight support treadmill training. Trial shoe lift insert in L shoe. Trial R foot-up brace again.  Continue toward goals.  Facilitate normal weight shifting to R for improved LLE clearance during gait to improve functional safety.  PT Home Exercise Plan Stretching/strength/balance HEP   Consulted and Agree with Plan of Care Patient   Family Member Consulted pt's husband-[name]        Problem List Patient Active Problem List   Diagnosis Date Noted  . Spastic hemiplegia and hemiparesis affecting dominant side ([place]) 09/13/2015  . Adhesive capsulitis of right shoulder 08/24/2015  . Symptomatic partial epilepsy with simple partial seizures ([place]) 08/21/2015  . Nontraumatic cortical hemorrhage of cerebral hemisphere ([place])   . Seizure ([place])   . Seizures ([place])   . Adjustment disorder with depressed mood   . Contracture of muscle ankle and foot 06/11/2015  . Hemiplga fol ntrm intcrbl hemor aff right dominant side 06/06/2015  . Left-sided intracerebral hemorrhage ([place]) 06/04/2015  . Seizure disorder as sequela of cerebrovascular accident ([place])   . Seizure disorder ([place]) 06/03/2015  . Sepsis ([place]) 06/03/2015  . UTI (urinary tract infection) 06/03/2015  . Hypotension 06/02/2015  . Right spastic hemiparesis ([place]) 05/28/2015  . 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 History of anxiety disorder 05/28/2015  . ICH  (intracerebral hemorrhage) ([place])   . Seizure disorder, nonconvulsive, with status epilepticus ([place])   . Cerebral venous thrombosis of cortical vein   . Cytotoxic cerebral edema ([place])   . IVH (intraventricular hemorrhage) ([place])   . Term pregnancy 05/07/2015  . Spontaneous vaginal delivery 05/07/2015    [facility] 09/14/2015, 3:22 PM  [place], [place]  Name: Kaya Pottenger MRN: [id number] Date of Birth: [birth year]  At end of session: PT encouraged pt to cease self-pay orthopedic physical therapy, as neuro PT and OT have observed that pt's gait deviations and L UE synergy pattern and L UT elevation during gait have worsened since she began receiving ortho PT. Pt agreed and stated she actually ceased going to ortho PT this week and has more energy and does not plan to go back. Geoffry Paradise, PT,DPT 09/15/2015 11:15 AM Phone: [phone] Fax: [phone]

## 2015-09-18 ENCOUNTER — Ambulatory Visit: Payer: BLUE CROSS/BLUE SHIELD | Admitting: Occupational Therapy

## 2015-09-18 ENCOUNTER — Ambulatory Visit: Payer: BLUE CROSS/BLUE SHIELD | Admitting: Physical Therapy

## 2015-09-18 ENCOUNTER — Encounter: Payer: BLUE CROSS/BLUE SHIELD | Admitting: Speech Pathology

## 2015-09-18 DIAGNOSIS — R29898 Other symptoms and signs involving the musculoskeletal system: Secondary | ICD-10-CM

## 2015-09-18 DIAGNOSIS — G8111 Spastic hemiplegia affecting right dominant side: Secondary | ICD-10-CM

## 2015-09-18 DIAGNOSIS — R279 Unspecified lack of coordination: Secondary | ICD-10-CM | POA: Diagnosis not present

## 2015-09-18 DIAGNOSIS — R269 Unspecified abnormalities of gait and mobility: Secondary | ICD-10-CM

## 2015-09-18 DIAGNOSIS — IMO0002 Reserved for concepts with insufficient information to code with codable children: Secondary | ICD-10-CM

## 2015-09-18 NOTE — Therapy (Signed)
[facility] [address] [place], [place], [address] Phone: [phone]   Fax:  [phone]  Occupational Therapy Treatment  Patient Details  Name: Ashley [name] MRN: [id number] Date of Birth: 04-17-1976 No Data Recorded  Encounter Date: 09/18/2015      OT End of Session - 09/18/15 1226    Visit Number 22   Number of Visits 34   Date for OT Re-Evaluation 10/27/15   Authorization Type BCBS   Authorization Time Period 30 visit OT- week 3/8   Authorization - Visit Number 38   Authorization - Number of Visits 30   OT Start Time 0848   OT Stop Time 0930   OT Time Calculation (min) 42 min   Activity Tolerance Patient tolerated treatment well   Behavior During Therapy [facility] for tasks assessed/performed      Past Medical History  Diagnosis Date  . Anxiety   . ICH (intracerebral hemorrhage) ([place])   . Stroke [facility])     Past Surgical History  Procedure Laterality Date  . Dilation and curettage of uterus      There were no vitals filed for this visit.  Visit Diagnosis:  Right spastic hemiparesis ([place])  Weakness due to cerebrovascular accident      Subjective Assessment - 09/18/15 1223    Subjective  Denies pain   Pertinent History see epic snapshot; pt with SAH 2 weeks after deliverying her 3rd child   Patient Stated Goals to be able to use my R arm normally   Currently in Pain? No/denies      Neuro muscular re-ed in supine with emphasis on weightbearing through shoulder then right elbow in sidelying and as well as closed chain shoulder flexion. Seated weightbearing though elbow with lateral upper trunk flexion  then weightbearing though hand with patient performing lateral trunk flexion and elbow extension. Quadraped for cat and cow positions, followed by lateral weight shifts with therapist  facilitating shoulder position and increased weightbearing through right side. Seated therapist facilitated shoulder and  scapular positioning, for increased activation of scapula during functional reaching.Pt performed low range shoulder flexion with bilateral UE's on  ball, therapist facilitating  shoulder positioning. Pt.demonstrates ability to perform low range functional reach at end of session with less compensation. Therapist re- issued updated HEP Pt verbalized understanding..                              OT Short Term Goals - 08/28/15 1926    OT SHORT TERM GOAL #1   Title I with HEP.   Status Achieved   OT SHORT TERM GOAL #2   Title Pt will demonstrate impoved fine motor coordinationas evidenced by decreasing RUE 9 hole peg test score to 35 secs or less.   Baseline 28.44 secs   Status Achieved   OT SHORT TERM GOAL #3   Title Pt will perform all basic ADLs at a supervision level.   Status Achieved   OT SHORT TERM GOAL #4   Title Pt will use RUE as an active assist for ADLS/IADLS at least 50 % of the time.   Status Achieved   OT SHORT TERM GOAL #5   Title Pt will perform basic home management/ cooking at min A level.   Status Achieved   Additional Short Term Goals   Additional Short Term Goals Yes   OT SHORT TERM GOAL #6   Title Pt will perform home management/  cooking at a supervision level consistently.   Baseline due 09/27/15   Time 4   Period Weeks   Status New   OT SHORT TERM GOAL #7   Title Pt will demonstrate ability to retrieve a lightweight object at 95* with pain less than or equal to 3/10.   Time 4   Period Weeks   Status New           OT Long Term Goals - 08/28/15 1929    OT LONG TERM GOAL #1   Title Pt will perfom all basic ADLS modified independently. due 10/27/15   Baseline min A with washing hair, difficulty with hygeine    Time 8   Period Weeks   Status On-going   OT LONG TERM GOAL #2   Title Pt will perform basic home management/ cooking at a supervision level demonstrating good safety awareness.- not met (now STG)   Baseline revised long  term goal- modified independent with cooking and home management with pt demonstrating good safety awareness   Time 8   Period Weeks   Status On-going   OT LONG TERM GOAL #3   Title Pt will demonstrate ability to retrieve a 3 lbs weight at 120 x 5 reps without dropping with RUE   Baseline goal deferred due to0 shoulder pain   Time 8   Period Weeks   Status Not Met   OT LONG TERM GOAL #4   Title Pt will resume use of RUE as dominant hand at least 75% of the time for ADLs/ IADLs. not met 30% goal revised   Baseline revised goal- Pt will use RUE as an active assist 75% of the time    Time 8   Status On-going   OT LONG TERM GOAL #5   Title Pt will demonstrate ability to perform a physical and cognitive task simultaneously with 80% or better accuracy.   Baseline not fully addressed due to recent shoulder pain-    Time 8   Status Deferred   Long Term Additional Goals   Additional Long Term Goals Yes   OT LONG TERM GOAL #6   Title Pt will demonstrate improved fine motor coordination as evidenced by decreasing RUE 9 hole peg test score to 30 secs or less.   Baseline 25.47 secs   Time 8   Status Achieved               Plan - 09/18/15 1224    Clinical Impression Statement Pt is progressing towards goals with improving pain free active movement in low range shoulder flexion.   Pt will benefit from skilled therapeutic intervention in order to improve on the following deficits (Retired) Abnormal gait;Decreased coordination;Decreased range of motion;Difficulty walking;Impaired flexibility;Decreased endurance;Decreased safety awareness;Increased edema;Impaired tone;Decreased knowledge of precautions;Decreased activity tolerance;Decreased balance;Decreased knowledge of use of DME;Pain;Impaired UE functional use;Impaired vision/preception;Decreased cognition;Decreased mobility;Decreased strength   OT Frequency 2x / week   OT Duration 8 weeks   OT Treatment/Interventions Self-care/ADL  training;Therapeutic exercise;Patient/family education;Balance training;Ultrasound;Neuromuscular education;Manual Therapy;Splinting;Therapeutic exercises;Energy conservation;Parrafin;DME and/or AE instruction;Therapeutic activities;Cognitive remediation/compensation;Gait Training;Fluidtherapy;Electrical Stimulation;Moist Heat;Contrast Bath;Passive range of motion;Visual/perceptual remediation/compensation   Plan NMR, low range functional reaching, weightbearing   Consulted and Agree with Plan of Care Patient        Problem List Patient Active Problem List   Diagnosis Date Noted  . Spastic hemiplegia and hemiparesis affecting dominant side ([place]) 09/13/2015  . Adhesive capsulitis of right shoulder 08/24/2015  . Symptomatic partial epilepsy with simple partial seizures ([place]) 08/21/2015  .  Nontraumatic cortical hemorrhage of cerebral hemisphere ([place])   . Seizure ([place])   . Seizures ([place])   . Adjustment disorder with depressed mood   . Contracture of muscle ankle and foot 06/11/2015  . Hemiplga fol ntrm intcrbl hemor aff right dominant side 06/06/2015  . Left-sided intracerebral hemorrhage ([place]) 06/04/2015  . Seizure disorder as sequela of cerebrovascular accident ([place])   . Seizure disorder ([place]) 06/03/2015  . Sepsis ([place]) 06/03/2015  . UTI (urinary tract infection) 06/03/2015  . Hypotension 06/02/2015  . Right spastic hemiparesis ([place]) 05/28/2015  . 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 History of anxiety disorder 05/28/2015  . ICH (intracerebral hemorrhage) ([place])   . Seizure disorder, nonconvulsive, with status epilepticus ([place])   . Cerebral venous thrombosis of cortical vein   . Cytotoxic cerebral edema ([place])   . IVH (intraventricular hemorrhage) ([place])   . Term pregnancy 05/07/2015  . Spontaneous vaginal delivery 05/07/2015    Cynethia Schindler 09/18/2015, 12:27 PM Theone Murdoch, OTR/L [phone] Phone: [phone] 12:27 PM 09/18/2015 [facility] [address] [place], [place], [address] Phone: [phone]   Fax:  [phone]  Name: Ashley [name] MRN: [id number] Date of Birth: 03/16/1976

## 2015-09-19 ENCOUNTER — Ambulatory Visit (INDEPENDENT_AMBULATORY_CARE_PROVIDER_SITE_OTHER): Payer: BLUE CROSS/BLUE SHIELD

## 2015-09-19 ENCOUNTER — Ambulatory Visit: Payer: BLUE CROSS/BLUE SHIELD | Admitting: Occupational Therapy

## 2015-09-19 DIAGNOSIS — G40109 Localization-related (focal) (partial) symptomatic epilepsy and epileptic syndromes with simple partial seizures, not intractable, without status epilepticus: Secondary | ICD-10-CM

## 2015-09-19 DIAGNOSIS — R278 Other lack of coordination: Secondary | ICD-10-CM

## 2015-09-19 DIAGNOSIS — R279 Unspecified lack of coordination: Secondary | ICD-10-CM

## 2015-09-19 DIAGNOSIS — G8111 Spastic hemiplegia affecting right dominant side: Secondary | ICD-10-CM

## 2015-09-19 DIAGNOSIS — IMO0002 Reserved for concepts with insufficient information to code with codable children: Secondary | ICD-10-CM

## 2015-09-19 DIAGNOSIS — R269 Unspecified abnormalities of gait and mobility: Secondary | ICD-10-CM

## 2015-09-19 DIAGNOSIS — M25511 Pain in right shoulder: Secondary | ICD-10-CM

## 2015-09-19 NOTE — Therapy (Signed)
[facility] [address] [place], [place], [address] Phone: [phone]   Fax:  [phone]  Physical Therapy Treatment  Patient Details  Name: Ashley [name] MRN: [id number] Date of Birth: [birth year] No Data Recorded  Encounter Date: 09/18/2015      PT End of Session - 09/19/15 1953    Visit Number 23   Number of Visits 25   Date for PT Re-Evaluation 09/26/15   Authorization Type BCBS. Per Laverda Page, pt gets 30 visits for PT and 30 for OT.   Authorization - Visit Number 22   Authorization - Number of Visits 30   PT Start Time 5701   PT Stop Time 1020   PT Time Calculation (min) 45 min   Equipment Utilized During Treatment Gait belt      Past Medical History  Diagnosis Date  . Anxiety   . ICH (intracerebral hemorrhage) ([place])   . Stroke [facility])     Past Surgical History  Procedure Laterality Date  . Dilation and curettage of uterus      There were no vitals filed for this visit.  Visit Diagnosis:  Right spastic hemiparesis ([facility])  Abnormality of gait  Right leg weakness      Subjective Assessment - 09/19/15 1942    Subjective Pt states that she has trouble putting weight on her R leg - "I put too much weight on my left leg because I don't trust my right leg"   Pertinent History Seizures, low BP   Patient Stated Goals "Be as close to normal as much as possible and to go back to running" Get back to taking care of my kids, 63 weeks old and 29 and 44 years old   Currently in Pain? No/denies                         [facility] Adult PT Treatment/Exercise - 09/19/15 0001    Ambulation/Gait   Ambulation/Gait Yes   Ambulation/Gait Assistance 4: Min guard  tactile cue to incr. R pelvic protraction & incr. wt shift   Ambulation Distance (Feet) 120 Feet   Assistive device None   Gait Pattern Decreased step length - right;Decreased stance time - right;Decreased weight shift to right;Right foot  flat;Decreased hip/knee flexion - right   Ambulation Surface Level;Indoor   Number of Stairs 4   Height of Stairs 6   Lumbar Exercises: Aerobic   Elliptical 1" forward with assistance - level 1.1   Lumbar Exercises: Standing   Heel Raises 10 reps  bil. LE's   Functional Squats 10 reps   Lumbar Exercises: Supine   Bridge 10 reps   Straight Leg Raise 10 reps  2# weight used on RLE   Ankle Exercises: Stretches   Gastroc Stretch 3 reps;30 seconds  assistance to maintain R ankle in neutral -2" step used     TherEx; R hip/knee extension control off side of mat - with 2# weight on RLE x 10 reps; R hip abduction in L sidelying  With no weight 2 sets of 10 reps;  R clam shell in L sidelying with 2# weight x 2 sets 10 reps R single limb partial squats x 10 reps with min assist  NeuroRe-ed; alternate tap ups to 6" step with UE support prn with CGA:  Stepping over and back of 1/2 bolster on floor  inside bars with LUE support with LLE for incr. RLE weight -bearing and imrpoved SLS  Step training with L hand rail - step by step sequence with CGA         PT Education - 09/19/15 1951    Education provided Yes   Education Details Recommended heel cord stretch in standing (with assistance to maintain neutral position for R ankle) and receommended use of 2# weight for PRE's   Person(s) Educated Patient   Methods Explanation;Demonstration   Comprehension Verbalized understanding;Returned demonstration          PT Short Term Goals - 08/23/15 1203    PT SHORT TERM GOAL #1   Title Pt will be IND in HEP to improve strength, balance, and endurance. Target date: 07/27/15.   Status Partially Met   PT SHORT TERM GOAL #2   Title Pt will improve gait speed to >/=1.38f/sec to decrease falls risk. Target date: 07/27/15.   Status Achieved   PT SHORT TERM GOAL #3   Title Pt will ambulate 300' with LRAD, over even terrain, at MOD I level to improve functional mobility. Target date: 07/27/15.    Status Achieved   PT SHORT TERM GOAL #4   Title Pt will perform TUG in </=13.5 seconds with LRAD to decr. falls risk. Target date: 07/27/15.   Status Partially Met   PT SHORT TERM GOAL #5   Title Perform BERG and write STG and LTG as appropriate. Target date: 07/27/15.   Status Achieved   PT SHORT TERM GOAL #6   Title Pt will improve BERG to >/=51/56 to decr. falls risk. Target date: 07/27/15.   Status Achieved           PT Long Term Goals - 08/28/15 1628    PT LONG TERM GOAL #1   Title Pt will ambulate 1000' over even/uneven terrain, with LRAD, at MOD I level to improve functional mobiliity. Target date: 09/25/15.   Baseline deferred to new 4 week POC. All unmet goals will be carried over to new 4 week POC: 09/25/15.   Status On-going   PT LONG TERM GOAL #2   Title Pt will amb. 300' over even terrain without an AD, IND, to safely amb. in home. Target date: 09/25/15.   Status On-going   PT LONG TERM GOAL #3   Title Pt will improve gait speed to >/=2.638fsec, with LRAD, to amb. safely in the community. Target date: 09/25/15.   Status On-going   PT LONG TERM GOAL #4   Title Pt will verbalize plans to join a fitness center upon d/c from PT to continue to maintain strength and endurance gains made during PT. Target date: 09/25/15.   Status On-going   PT LONG TERM GOAL #5   Title Pt improve BERG score to >/=55/56 to decr. falls risk. Target date: 09/25/15.   Status On-going               Plan - 09/19/15 1954    Clinical Impression Statement Pt fatigued easily with use of weight for PRE's - noticed RLE shaking/tremors with fatigue in both open and closed chain exercises; pt would benefit from heel cord in standing due to severity of  R heel cord tightness   Pt will benefit from skilled therapeutic intervention in order to improve on the following deficits Abnormal gait;Decreased endurance;Impaired sensation;Decreased knowledge of precautions;Decreased activity  tolerance;Decreased knowledge of use of DME;Decreased strength;Impaired UE functional use;Impaired tone;Decreased balance;Decreased mobility;Decreased cognition;Decreased range of motion;Decreased safety awareness;Decreased coordination;Impaired flexibility   Rehab Potential Good   Clinical Impairments Affecting Rehab Potential Seizures which may  limit intensity of PT   PT Frequency 2x / week   PT Duration 8 weeks   PT Treatment/Interventions ADLs/Self Care Home Management;Neuromuscular re-education;Cognitive remediation;Biofeedback;DME Instruction;Gait training;Stair training;Canalith Repostioning;Patient/family education;Orthotic Fit/Training;Balance training;Therapeutic exercise;Manual techniques;Therapeutic activities;Vestibular   PT Next Visit Plan cont use of weights for PRE's ; activities to require R SLS and incr. wieght shift onto RLE   PT Home Exercise Plan Stretching/strength/balance HEP   Consulted and Agree with Plan of Care Patient        Problem List Patient Active Problem List   Diagnosis Date Noted  . Spastic hemiplegia and hemiparesis affecting dominant side ([place]) 09/13/2015  . Adhesive capsulitis of right shoulder 08/24/2015  . Symptomatic partial epilepsy with simple partial seizures ([place]) 08/21/2015  . Nontraumatic cortical hemorrhage of cerebral hemisphere ([place])   . Seizure ([place])   . Seizures ([place])   . Adjustment disorder with depressed mood   . Contracture of muscle ankle and foot 06/11/2015  . Hemiplga fol ntrm intcrbl hemor aff right dominant side 06/06/2015  . Left-sided intracerebral hemorrhage ([place]) 06/04/2015  . Seizure disorder as sequela of cerebrovascular accident ([place])   . Seizure disorder ([place]) 06/03/2015  . Sepsis ([place]) 06/03/2015  . UTI (urinary tract infection) 06/03/2015  . Hypotension 06/02/2015  . Right spastic hemiparesis ([place]) 05/28/2015  . 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 History of anxiety disorder  05/28/2015  . ICH (intracerebral hemorrhage) ([place])   . Seizure disorder, nonconvulsive, with status epilepticus ([place])   . Cerebral venous thrombosis of cortical vein   . Cytotoxic cerebral edema ([place])   . IVH (intraventricular hemorrhage) ([place])   . Term pregnancy 05/07/2015  . Spontaneous vaginal delivery 05/07/2015    [place], PT 09/19/2015, 8:02 PM  [facility] [address] [place], [place], [address] Phone: [phone]   Fax:  [phone]  Name: Ashley [name] MRN: [id number] Date of Birth: [birth year]

## 2015-09-20 MED ORDER — GADOPENTETATE DIMEGLUMINE 469.01 MG/ML IV SOLN: 12.0000 mL | Freq: Once | INTRAVENOUS | Status: DC | PRN

## 2015-09-20 NOTE — Therapy (Signed)
[facility] [address] [place], [place], [address] Phone: [phone]   Fax:  [phone]  Occupational Therapy Treatment  Patient Details  Name: Ashley [name] MRN: [id number] Date of Birth: 27-Aug-1976 No Data Recorded  Encounter Date: 09/19/2015      OT End of Session - 09/20/15 1328    Visit Number 24   Number of Visits 34   Date for OT Re-Evaluation 10/27/15   Authorization Type BCBS   Authorization Time Period 30 visit OT- week 3/8   Authorization - Visit Number 23   Authorization - Number of Visits 30   OT Start Time 6314   OT Stop Time 1445   OT Time Calculation (min) 41 min   Activity Tolerance Patient tolerated treatment well   Behavior During Therapy [facility] for tasks assessed/performed      Past Medical History  Diagnosis Date  . Anxiety   . ICH (intracerebral hemorrhage) ([place])   . Stroke [facility])     Past Surgical History  Procedure Laterality Date  . Dilation and curettage of uterus      There were no vitals filed for this visit.  Visit Diagnosis:  Right spastic hemiparesis ([place])  Weakness due to cerebrovascular accident  Abnormality of gait  Decreased coordination  Pain in joint of right shoulder      Subjective Assessment - 09/19/15 1407    Subjective  Pt reports sleeping on right arm last night   Pertinent History see epic snapshot; pt with SAH 2 weeks after deliverying her 3rd child   Patient Stated Goals to be able to use my R arm normally   Currently in Pain? No/denies   Multiple Pain Sites No          Neuro muscular re-ed in supine with emphasis on weightbearing through RUE in supine,then seated Therapist facilitated shoulder and scapular positioning, for increased activation of scapula during functional body on RUE movements.cat and cow positions then lateral weight shifts while in quadraped, min v.c. And facilitation for RUE position. Seated weightbearing though bilateral  UE's with lateral weightshifts facilitation  to avoid compensatory positioning.Low range closed chain shoulder flexion with ball, min facilitation at right shoulder and scapula.                          OT Short Term Goals - 08/28/15 1926    OT SHORT TERM GOAL #1   Title I with HEP.   Status Achieved   OT SHORT TERM GOAL #2   Title Pt will demonstrate impoved fine motor coordinationas evidenced by decreasing RUE 9 hole peg test score to 35 secs or less.   Baseline 28.44 secs   Status Achieved   OT SHORT TERM GOAL #3   Title Pt will perform all basic ADLs at a supervision level.   Status Achieved   OT SHORT TERM GOAL #4   Title Pt will use RUE as an active assist for ADLS/IADLS at least 50 % of the time.   Status Achieved   OT SHORT TERM GOAL #5   Title Pt will perform basic home management/ cooking at min A level.   Status Achieved   Additional Short Term Goals   Additional Short Term Goals Yes   OT SHORT TERM GOAL #6   Title Pt will perform home management/ cooking at a supervision level consistently.   Baseline due 09/27/15   Time 4   Period Weeks   Status  New   OT SHORT TERM GOAL #7   Title Pt will demonstrate ability to retrieve a lightweight object at 95* with pain less than or equal to 3/10.   Time 4   Period Weeks   Status New           OT Long Term Goals - 08/28/15 1929    OT LONG TERM GOAL #1   Title Pt will perfom all basic ADLS modified independently. due 10/27/15   Baseline min A with washing hair, difficulty with hygeine    Time 8   Period Weeks   Status On-going   OT LONG TERM GOAL #2   Title Pt will perform basic home management/ cooking at a supervision level demonstrating good safety awareness.- not met (now STG)   Baseline revised long term goal- modified independent with cooking and home management with pt demonstrating good safety awareness   Time 8   Period Weeks   Status On-going   OT LONG TERM GOAL #3   Title Pt will  demonstrate ability to retrieve a 3 lbs weight at 120 x 5 reps without dropping with RUE   Baseline goal deferred due to0 shoulder pain   Time 8   Period Weeks   Status Not Met   OT LONG TERM GOAL #4   Title Pt will resume use of RUE as dominant hand at least 75% of the time for ADLs/ IADLs. not met 30% goal revised   Baseline revised goal- Pt will use RUE as an active assist 75% of the time    Time 8   Status On-going   OT LONG TERM GOAL #5   Title Pt will demonstrate ability to perform a physical and cognitive task simultaneously with 80% or better accuracy.   Baseline not fully addressed due to recent shoulder pain-    Time 8   Status Deferred   Long Term Additional Goals   Additional Long Term Goals Yes   OT LONG TERM GOAL #6   Title Pt will demonstrate improved fine motor coordination as evidenced by decreasing RUE 9 hole peg test score to 30 secs or less.   Baseline 25.47 secs   Time 8   Status Achieved               Plan - 09/20/15 1326    Clinical Impression Statement Pt is progressing towards goals with deacreasing pain and improvinglow range shoulder flexion   Pt will benefit from skilled therapeutic intervention in order to improve on the following deficits (Retired) Abnormal gait;Decreased coordination;Decreased range of motion;Difficulty walking;Impaired flexibility;Decreased endurance;Decreased safety awareness;Increased edema;Impaired tone;Decreased knowledge of precautions;Decreased activity tolerance;Decreased balance;Decreased knowledge of use of DME;Pain;Impaired UE functional use;Impaired vision/preception;Decreased cognition;Decreased mobility;Decreased strength   Rehab Potential Good   OT Frequency 2x / week   OT Duration 8 weeks   OT Treatment/Interventions Self-care/ADL training;Therapeutic exercise;Patient/family education;Balance training;Ultrasound;Neuromuscular education;Manual Therapy;Splinting;Therapeutic exercises;Energy conservation;Parrafin;DME  and/or AE instruction;Therapeutic activities;Cognitive remediation/compensation;Gait Training;Fluidtherapy;Electrical Stimulation;Moist Heat;Contrast Bath;Passive range of motion;Visual/perceptual remediation/compensation   Plan NMR, low range functional reaching   OT Home Exercise Plan issued: ball ex in supine, trunk for A-p tilts with trunk rotation(07/05/15)   Consulted and Agree with Plan of Care Patient        Problem List Patient Active Problem List   Diagnosis Date Noted  . Spastic hemiplegia and hemiparesis affecting dominant side ([place]) 09/13/2015  . Adhesive capsulitis of right shoulder 08/24/2015  . Symptomatic partial epilepsy with simple partial seizures ([place]) 08/21/2015  . Nontraumatic cortical  hemorrhage of cerebral hemisphere ([place])   . Seizure ([place])   . Seizures ([place])   . Adjustment disorder with depressed mood   . Contracture of muscle ankle and foot 06/11/2015  . Hemiplga fol ntrm intcrbl hemor aff right dominant side 06/06/2015  . Left-sided intracerebral hemorrhage ([place]) 06/04/2015  . Seizure disorder as sequela of cerebrovascular accident ([place])   . Seizure disorder ([place]) 06/03/2015  . Sepsis ([place]) 06/03/2015  . UTI (urinary tract infection) 06/03/2015  . Hypotension 06/02/2015  . Right spastic hemiparesis ([place]) 05/28/2015  . 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 History of anxiety disorder 05/28/2015  . ICH (intracerebral hemorrhage) ([place])   . Seizure disorder, nonconvulsive, with status epilepticus ([place])   . Cerebral venous thrombosis of cortical vein   . Cytotoxic cerebral edema ([place])   . IVH (intraventricular hemorrhage) ([place])   . Term pregnancy 05/07/2015  . Spontaneous vaginal delivery 05/07/2015    Jeryl Wilbourn 09/20/2015, 1:30 PM Theone Murdoch, OTR/L [phone] Phone: [phone] 1:35 PM 09/20/2015 [facility] [address] [place], [place],  [address] Phone: [phone]   Fax:  [phone]  Name: Nakia Remmers MRN: [id number] Date of Birth: 1976-05-18

## 2015-09-21 ENCOUNTER — Ambulatory Visit: Payer: BLUE CROSS/BLUE SHIELD

## 2015-09-21 DIAGNOSIS — R279 Unspecified lack of coordination: Secondary | ICD-10-CM | POA: Diagnosis not present

## 2015-09-21 DIAGNOSIS — R278 Other lack of coordination: Secondary | ICD-10-CM

## 2015-09-21 DIAGNOSIS — R269 Unspecified abnormalities of gait and mobility: Secondary | ICD-10-CM

## 2015-09-21 DIAGNOSIS — R252 Cramp and spasm: Secondary | ICD-10-CM

## 2015-09-21 DIAGNOSIS — R29898 Other symptoms and signs involving the musculoskeletal system: Secondary | ICD-10-CM

## 2015-09-21 NOTE — Therapy (Signed)
[facility] [address] [place], [place], [address] Phone: [phone]   Fax:  [phone]  Physical Therapy Treatment  Patient Details  Name: Ashley [name] MRN: [id number] Date of Birth: April 06, 1976 No Data Recorded  Encounter Date: 09/21/2015      PT End of Session - 09/21/15 1408    Visit Number 24   Number of Visits 25   Date for PT Re-Evaluation 09/26/15  PT requesting additional 7 weeks of PT (11/13/15)   Authorization Type BC[employer]. Per Laverda Page, pt gets 30 visits for PT and 30 for OT.   Authorization - Visit Number 23   Authorization - Number of Visits 30   PT Start Time 1320  pt late   PT Stop Time 1402   PT Time Calculation (min) 42 min   Equipment Utilized During Treatment Gait belt   Activity Tolerance Patient tolerated treatment well   Behavior During Therapy WFL for tasks assessed/performed      Past Medical History  Diagnosis Date  . Anxiety   . ICH (intracerebral hemorrhage) ([place])   . Stroke [facility])     Past Surgical History  Procedure Laterality Date  . Dilation and curettage of uterus      There were no vitals filed for this visit.  Visit Diagnosis:  Abnormality of gait - Plan: PT plan of care cert/re-cert  Decreased coordination - Plan: PT plan of care cert/re-cert  Right leg weakness - Plan: PT plan of care cert/re-cert  Spasticity - Plan: PT plan of care cert/re-cert      Subjective Assessment - 09/21/15 1324    Subjective Pt denied falls or changes since last visit.    Pertinent History Seizures, low BP   Patient Stated Goals "Be as close to normal as much as possible and to go back to running" Get back to taking care of my kids, 83 weeks old and 68 and 3 years old   Currently in Pain? No/denies                         [facility] Adult PT Treatment/Exercise - 09/21/15 1325    Ambulation/Gait   Ambulation/Gait Yes   Ambulation/Gait Assistance 5: Supervision;4: Min  guard   Ambulation/Gait Assistance Details Cues to improve wt. shifting onto R LE with manual facilitation and VC's. Cues to decr. R UE flexion during gait and to improve L heel strike.   Ambulation Distance (Feet) 345 Feet  indoors and 750' outdoors   Assistive device Straight cane;None   Gait Pattern Decreased step length - right;Decreased stance time - right;Decreased weight shift to right;Right foot flat;Decreased hip/knee flexion - right   Ambulation Surface Level;Indoor   Gait velocity 2.10ft/sec with SPC and 2.40ft/sec. without AD   Standardized Balance Assessment   Standardized Balance Assessment Berg Balance Test   Berg Balance Test   Sit to Stand Able to stand without using hands and stabilize independently   Standing Unsupported Able to stand safely 2 minutes   Sitting with Back Unsupported but Feet Supported on Floor or Stool Able to sit safely and securely 2 minutes   Stand to Sit Sits safely with minimal use of hands   Transfers Able to transfer safely, minor use of hands   Standing Unsupported with Eyes Closed Able to stand 10 seconds safely   Standing Ubsupported with Feet Together Able to place feet together independently and stand 1 minute safely   From Standing, Reach Forward  with Outstretched Arm Can reach confidently >25 cm (10")   From Standing Position, Pick up Object from [place] to pick up shoe safely and easily   From Standing Position, Turn to Look Behind Over each Shoulder Looks behind from both sides and weight shifts well   Turn 360 Degrees Able to turn 360 degrees safely but slowly   Standing Unsupported, Alternately Place Feet on Step/Stool Able to stand independently and complete 8 steps >20 seconds   Standing Unsupported, One Foot in Front Able to plae foot ahead of the other independently and hold 30 seconds   Standing on One Leg Able to lift leg independently and hold > 10 seconds   Total Score 52                PT Education - 09/21/15 1406     Education provided Yes   Education Details Discussed goal progress and outcome measure results. PT also discussed renewing through 10/2015 in order to maximize gains after R LE botox injection and after pt ceased going to private pay PT, as this caused incr. fatigue and gait deviations.   Person(s) Educated Patient   Methods Explanation   Comprehension Verbalized understanding          PT Short Term Goals - 08/23/15 1203    PT SHORT TERM GOAL #1   Title Pt will be IND in HEP to improve strength, balance, and endurance. Target date: 07/27/15.   Status Partially Met   PT SHORT TERM GOAL #2   Title Pt will improve gait speed to >/=1.72ft/sec to decrease falls risk. Target date: 07/27/15.   Status Achieved   PT SHORT TERM GOAL #3   Title Pt will ambulate 300' with LRAD, over even terrain, at MOD I level to improve functional mobility. Target date: 07/27/15.   Status Achieved   PT SHORT TERM GOAL #4   Title Pt will perform TUG in </=13.5 seconds with LRAD to decr. falls risk. Target date: 07/27/15.   Status Partially Met   PT SHORT TERM GOAL #5   Title Perform BERG and write STG and LTG as appropriate. Target date: 07/27/15.   Status Achieved   PT SHORT TERM GOAL #6   Title Pt will improve BERG to >/=51/56 to decr. falls risk. Target date: 07/27/15.   Status Achieved           PT Long Term Goals - 09/21/15 1411    PT LONG TERM GOAL #1   Title Pt will ambulate 1000' over even/uneven terrain, with LRAD, at MOD I level to improve functional mobiliity. Target date: 09/25/15.   Baseline deferred to new 4 week POC. All unmet goals will be carried over to new 7 week POC: 11/12/14.   Status Partially Met   PT LONG TERM GOAL #2   Title Pt will amb. 300' over even terrain without an AD, IND, to safely amb. in home. Target date: 09/25/15.   Status Partially Met   PT LONG TERM GOAL #3   Title Pt will improve gait speed to >/=2.74ft/sec, with LRAD, to amb. safely in the community. Target  date: 09/25/15.   Status Not Met   PT LONG TERM GOAL #4   Title Pt will verbalize plans to join a fitness center upon d/c from PT to continue to maintain strength and endurance gains made during PT. Target date: 09/25/15.   Status Deferred   PT LONG TERM GOAL #5   Title Pt improve BERG score to >/=55/56  to decr. falls risk. Target date: 09/25/15.   Status Partially Met               Plan - 09/21/15 1409    Clinical Impression Statement Pt demonstrated progress as her BERG score improved to 52/56, placing her in the low falls risk category. Pt able to correct gait deviations after tactile and VC's. Pt partially met LTGs 1, 2 and 5. Pt did not meet LTG 3, and LTG 4 deferred. Pt would continue to benefit from skilled PT to improve safety during functional mobility. PT will send re-cert to MD.   Pt will benefit from skilled therapeutic intervention in order to improve on the following deficits Abnormal gait;Decreased endurance;Impaired sensation;Decreased knowledge of precautions;Decreased activity tolerance;Decreased knowledge of use of DME;Decreased strength;Impaired UE functional use;Impaired tone;Decreased balance;Decreased mobility;Decreased cognition;Decreased range of motion;Decreased safety awareness;Decreased coordination;Impaired flexibility   Rehab Potential Good   Clinical Impairments Affecting Rehab Potential Seizures which may limit intensity of PT   PT Frequency 2x / week   PT Duration --  PT requesting additional 7 weeks   PT Treatment/Interventions ADLs/Self Care Home Management;Neuromuscular re-education;Cognitive remediation;Biofeedback;DME Instruction;Gait training;Stair training;Canalith Repostioning;Patient/family education;Orthotic Fit/Training;Balance training;Therapeutic exercise;Manual techniques;Therapeutic activities;Vestibular   PT Next Visit Plan BWSTT and cont use of weights for PRE's ; activities to require R SLS and incr. wieght shift onto RLE   PT Home  Exercise Plan Stretching/strength/balance HEP   Consulted and Agree with Plan of Care Patient        Problem List Patient Active Problem List   Diagnosis Date Noted  . Spastic hemiplegia and hemiparesis affecting dominant side ([place]) 09/13/2015  . Adhesive capsulitis of right shoulder 08/24/2015  . Symptomatic partial epilepsy with simple partial seizures ([place]) 08/21/2015  . Nontraumatic cortical hemorrhage of cerebral hemisphere ([place])   . Seizure ([place])   . Seizures ([place])   . Adjustment disorder with depressed mood   . Contracture of muscle ankle and foot 06/11/2015  . Hemiplga fol ntrm intcrbl hemor aff right dominant side 06/06/2015  . Left-sided intracerebral hemorrhage ([place]) 06/04/2015  . Seizure disorder as sequela of cerebrovascular accident ([place])   . Seizure disorder ([place]) 06/03/2015  . Sepsis ([place]) 06/03/2015  . UTI (urinary tract infection) 06/03/2015  . Hypotension 06/02/2015  . Right spastic hemiparesis ([place]) 05/28/2015  . 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 History of anxiety disorder 05/28/2015  . ICH (intracerebral hemorrhage) ([place])   . Seizure disorder, nonconvulsive, with status epilepticus ([place])   . Cerebral venous thrombosis of cortical vein   . Cytotoxic cerebral edema ([place])   . IVH (intraventricular hemorrhage) ([place])   . Term pregnancy 05/07/2015  . Spontaneous vaginal delivery 05/07/2015    Imanol Bihl L 09/21/2015, 2:15 PM  [facility] [address] [place], [place], [address] Phone: [phone]   Fax:  [phone]  Name: Ashley [name] MRN: [id number] Date of Birth: 1976/09/12     Geoffry Paradise, PT,DPT 09/21/2015 2:15 PM Phone: [phone] Fax: [phone]

## 2015-09-25 ENCOUNTER — Encounter: Payer: BLUE CROSS/BLUE SHIELD | Admitting: Speech Pathology

## 2015-09-25 ENCOUNTER — Ambulatory Visit: Payer: BLUE CROSS/BLUE SHIELD | Admitting: *Deleted

## 2015-09-25 ENCOUNTER — Ambulatory Visit: Payer: BLUE CROSS/BLUE SHIELD | Admitting: Occupational Therapy

## 2015-09-27 ENCOUNTER — Ambulatory Visit: Payer: BLUE CROSS/BLUE SHIELD | Admitting: Occupational Therapy

## 2015-09-27 ENCOUNTER — Ambulatory Visit: Payer: BLUE CROSS/BLUE SHIELD

## 2015-09-27 DIAGNOSIS — G8111 Spastic hemiplegia affecting right dominant side: Secondary | ICD-10-CM

## 2015-09-27 DIAGNOSIS — M25511 Pain in right shoulder: Secondary | ICD-10-CM

## 2015-09-27 DIAGNOSIS — R278 Other lack of coordination: Secondary | ICD-10-CM

## 2015-09-27 DIAGNOSIS — R269 Unspecified abnormalities of gait and mobility: Secondary | ICD-10-CM

## 2015-09-27 DIAGNOSIS — IMO0002 Reserved for concepts with insufficient information to code with codable children: Secondary | ICD-10-CM

## 2015-09-27 DIAGNOSIS — R279 Unspecified lack of coordination: Secondary | ICD-10-CM | POA: Diagnosis not present

## 2015-09-27 NOTE — Therapy (Signed)
[facility] [address] [place], [place], [address] Phone: [phone]   Fax:  [phone]  Physical Therapy Treatment  Patient Details  Name: Ashley [name] MRN: [id number] Date of Birth: 1976-08-18 No Data Recorded  Encounter Date: 09/27/2015      PT End of Session - 09/27/15 1207    Visit Number 25   Number of Visits 39   Date for PT Re-Evaluation 09/26/15   Authorization Type BCBS. Per Laverda Page, pt gets 30 visits for PT and 30 for OT.   Authorization - Visit Number 24   Authorization - Number of Visits 30   PT Start Time 1017   PT Stop Time 1058   PT Time Calculation (min) 41 min   Equipment Utilized During Treatment Gait belt  BWSTT harness   Activity Tolerance Patient tolerated treatment well   Behavior During Therapy WFL for tasks assessed/performed      Past Medical History  Diagnosis Date  . Anxiety   . ICH (intracerebral hemorrhage) ([place])   . Stroke [facility])     Past Surgical History  Procedure Laterality Date  . Dilation and curettage of uterus      There were no vitals filed for this visit.  Visit Diagnosis:  Abnormality of gait      Subjective Assessment - 09/27/15 1018    Subjective Pt denied falls or changes since last visit. Pt reported she did have to cancel last visit as she wasn't feeling good (nauseated perhaps from sinus drainage).    Pertinent History Seizures, low BP   Patient Stated Goals "Be as close to normal as much as possible and to go back to running" Get back to taking care of my kids, 40 weeks old and 63 and 40 years old   Currently in Pain? No/denies                         [facility] Adult PT Treatment/Exercise - 09/27/15 1045    Ambulation/Gait   Ambulation/Gait Yes   Ambulation/Gait Assistance 5: Supervision;4: Min guard;Other (comment)  BWSTT   Ambulation/Gait Assistance Details Pt amb. 6 minutes x2 on BWSTT system with VC's and tactile cues to  improve L heel strike, shifting wt. to R LE and stride length. Pt required one standing and one seated rest break during BWSTT. Cues during first two bouts of amb. to improve lateral wt. shifting to R side, pt then able to improve during last bout of amb. Gait trainer to improve stride/step length turned on while amb. On treadmill.   Ambulation Distance (Feet) --  0.1 miles x2 on treadmill, 50'x2, 450' and 200' overground   Assistive device Straight cane;Body weight support system   Gait Pattern Decreased step length - right;Decreased stance time - right;Decreased weight shift to right;Right foot flat;Decreased hip/knee flexion - right   Ambulation Surface Level;Indoor                PT Education - 09/27/15 1206    Education provided Yes   Education Details PT reminded pt to use SPC at home at all times, to decrease R UE flexion/pain, as pt reported she uses SPC intermittently when amb. at home.   Person(s) Educated Patient   Methods Explanation   Comprehension Verbalized understanding          PT Short Term Goals - 08/23/15 1203    PT SHORT TERM GOAL #1   Title Pt will be IND in  HEP to improve strength, balance, and endurance. Target date: 07/27/15.   Status Partially Met   PT SHORT TERM GOAL #2   Title Pt will improve gait speed to >/=1.31ft/sec to decrease falls risk. Target date: 07/27/15.   Status Achieved   PT SHORT TERM GOAL #3   Title Pt will ambulate 300' with LRAD, over even terrain, at MOD I level to improve functional mobility. Target date: 07/27/15.   Status Achieved   PT SHORT TERM GOAL #4   Title Pt will perform TUG in </=13.5 seconds with LRAD to decr. falls risk. Target date: 07/27/15.   Status Partially Met   PT SHORT TERM GOAL #5   Title Perform BERG and write STG and LTG as appropriate. Target date: 07/27/15.   Status Achieved   PT SHORT TERM GOAL #6   Title Pt will improve BERG to >/=51/56 to decr. falls risk. Target date: 07/27/15.   Status Achieved            PT Long Term Goals - 09/27/15 1209    PT LONG TERM GOAL #1   Title Pt will ambulate 1000' over even/uneven terrain, with LRAD, at MOD I level to improve functional mobiliity. Target date: 11/12/14.   Baseline deferred to new 4 week POC. All unmet goals will be carried over to new 7 week POC: 11/12/14.   Status On-going   PT LONG TERM GOAL #2   Title Pt will amb. 300' over even terrain without an AD, IND, to safely amb. in home. Target date: 11/12/14.   Status On-going   PT LONG TERM GOAL #3   Title Pt will improve gait speed to >/=2.65ft/sec, with LRAD, to amb. safely in the community. Target date: 11/12/14.   Status On-going   PT LONG TERM GOAL #4   Title Pt will verbalize plans to join a fitness center upon d/c from PT to continue to maintain strength and endurance gains made during PT. Target date: 11/12/14.   Status On-going   PT LONG TERM GOAL #5   Title Pt improve BERG score to >/=55/56 to decr. falls risk. Target date: 11/12/14.   Status On-going               Plan - 09/27/15 1207    Clinical Impression Statement Pt demonstrated progress as she was able to amb. overground with improved L heel strike, stride length, and wt. shifting onto RLE after BWSTT. Pt continues to require rest breaks 2/2 fatigue. Continue with POC.   Pt will benefit from skilled therapeutic intervention in order to improve on the following deficits Abnormal gait;Decreased endurance;Impaired sensation;Decreased knowledge of precautions;Decreased activity tolerance;Decreased knowledge of use of DME;Decreased strength;Impaired UE functional use;Impaired tone;Decreased balance;Decreased mobility;Decreased cognition;Decreased range of motion;Decreased safety awareness;Decreased coordination;Impaired flexibility   Rehab Potential Good   Clinical Impairments Affecting Rehab Potential Seizures which may limit intensity of PT   PT Frequency 2x / week   PT Duration --  requesting add'l 7 weeks of PT  through 11/12/14   PT Treatment/Interventions ADLs/Self Care Home Management;Neuromuscular re-education;Cognitive remediation;Biofeedback;DME Instruction;Gait training;Stair training;Canalith Repostioning;Patient/family education;Orthotic Fit/Training;Balance training;Therapeutic exercise;Manual techniques;Therapeutic activities;Vestibular   PT Next Visit Plan Review HEP and progress prn, R SLS activities   PT Home Exercise Plan Stretching/strength/balance HEP   Consulted and Agree with Plan of Care Patient        Problem List Patient Active Problem List   Diagnosis Date Noted  . Spastic hemiplegia and hemiparesis affecting dominant side ([facility]) 09/13/2015  . Adhesive capsulitis  of right shoulder 08/24/2015  . Symptomatic partial epilepsy with simple partial seizures ([place]) 08/21/2015  . Nontraumatic cortical hemorrhage of cerebral hemisphere ([place])   . Seizure ([place])   . Seizures ([place])   . Adjustment disorder with depressed mood   . Contracture of muscle ankle and foot 06/11/2015  . Hemiplga fol ntrm intcrbl hemor aff right dominant side 06/06/2015  . Left-sided intracerebral hemorrhage ([place]) 06/04/2015  . Seizure disorder as sequela of cerebrovascular accident ([place])   . Seizure disorder ([place]) 06/03/2015  . Sepsis ([place]) 06/03/2015  . UTI (urinary tract infection) 06/03/2015  . Hypotension 06/02/2015  . Right spastic hemiparesis ([place]) 05/28/2015  . 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 History of anxiety disorder 05/28/2015  . ICH (intracerebral hemorrhage) ([place])   . Seizure disorder, nonconvulsive, with status epilepticus ([place])   . Cerebral venous thrombosis of cortical vein   . Cytotoxic cerebral edema ([place])   . IVH (intraventricular hemorrhage) ([place])   . Term pregnancy 05/07/2015  . Spontaneous vaginal delivery 05/07/2015    Damonie Furney L 09/27/2015, 12:10 PM  [facility] [address]  [place], [place], [address] Phone: [phone]   Fax:  [phone]  Name: Nyjai Graff MRN: [id number] Date of Birth: September 18, 1976    Geoffry Paradise, PT,DPT 09/27/2015 12:10 PM Phone: [phone] Fax: [phone]

## 2015-09-28 NOTE — Therapy (Signed)
[facility] [address] [place], [place], [address] Phone: [phone]   Fax:  [phone]  Occupational Therapy Treatment  Patient Details  Name: Ashley [name] MRN: [id number] Date of Birth: 1975-10-25 No Data Recorded  Encounter Date: 09/27/2015      OT End of Session - 09/28/15 0828    Visit Number 25   Date for OT Re-Evaluation 10/27/15   Authorization Type BCBS   Authorization Time Period 30 visit OT- week 3/8   Authorization - Visit Number 24   Authorization - Number of Visits 30   OT Start Time 8502   OT Stop Time 1015   OT Time Calculation (min) 40 min   Activity Tolerance Patient tolerated treatment well   Behavior During Therapy [facility] for tasks assessed/performed      Past Medical History  Diagnosis Date  . Anxiety   . ICH (intracerebral hemorrhage) ([place])   . Stroke [facility])     Past Surgical History  Procedure Laterality Date  . Dilation and curettage of uterus      There were no vitals filed for this visit.  Visit Diagnosis:  Right spastic hemiparesis ([place])  Weakness due to cerebrovascular accident  Pain in joint of right shoulder  Coordination impairment      Subjective Assessment - 09/27/15 0959    Subjective  Pt reports feeling better today   Pertinent History see epic snapshot; pt with SAH 2 weeks after deliverying her 3rd child   Patient Stated Goals to be able to use my R arm normally   Currently in Pain? Yes   Pain Score 3    Pain Location Arm   Pain Orientation Right   Pain Descriptors / Indicators Aching   Pain Type Acute pain   Pain Onset More than a month ago   Pain Frequency Intermittent   Aggravating Factors  malpositioning   Pain Relieving Factors stretching proper positioning   Multiple Pain Sites No         Treatment:supine to sidelying on right arm in abduction, then weightbearing through elbow in sidelying and seated, with lateral trunk flexion/ extension,  while disengaging LUE and reaching, min-mod  Facilitation/ v.c. Seated weight bearing through right hand edge of mat with lateral trunk movements, min facilitation then progressing to low range closed chain shoulder flexion with PVC pipe frame, min facilitation/ vv.c. To avoid compensation at shoulder. Standing for arm bike x 3 mins for reciprocal movement, with therapist facilitating weightbearing through RLE,                         OT Short Term Goals - 09/27/15 1015    OT SHORT TERM GOAL #1   Title I with HEP.   Status Achieved   OT SHORT TERM GOAL #2   Status Achieved   OT SHORT TERM GOAL #3   Title Pt will perform all basic ADLs at a supervision level.   OT SHORT TERM GOAL #5   Title Pt will perform basic home management/ cooking at min A level.   Status Achieved   OT SHORT TERM GOAL #6   Title Pt will perform home management/ cooking at a supervision level consistently.   Status On-going   OT SHORT TERM GOAL #7   Title Pt will demonstrate ability to retrieve a lightweight object at 95* with pain less than or equal to 3/10.   Status On-going  OT Long Term Goals - 08/28/15 1929    OT LONG TERM GOAL #1   Title Pt will perfom all basic ADLS modified independently. due 10/27/15   Baseline min A with washing hair, difficulty with hygeine    Time 8   Period Weeks   Status On-going   OT LONG TERM GOAL #2   Title Pt will perform basic home management/ cooking at a supervision level demonstrating good safety awareness.- not met (now STG)   Baseline revised long term goal- modified independent with cooking and home management with pt demonstrating good safety awareness   Time 8   Period Weeks   Status On-going   OT LONG TERM GOAL #3   Title Pt will demonstrate ability to retrieve a 3 lbs weight at 120 x 5 reps without dropping with RUE   Baseline goal deferred due to0 shoulder pain   Time 8   Period Weeks   Status Not Met   OT LONG TERM GOAL #4    Title Pt will resume use of RUE as dominant hand at least 75% of the time for ADLs/ IADLs. not met 30% goal revised   Baseline revised goal- Pt will use RUE as an active assist 75% of the time    Time 8   Status On-going   OT LONG TERM GOAL #5   Title Pt will demonstrate ability to perform a physical and cognitive task simultaneously with 80% or better accuracy.   Baseline not fully addressed due to recent shoulder pain-    Time 8   Status Deferred   Long Term Additional Goals   Additional Long Term Goals Yes   OT LONG TERM GOAL #6   Title Pt will demonstrate improved fine motor coordination as evidenced by decreasing RUE 9 hole peg test score to 30 secs or less.   Baseline 25.47 secs   Time 8   Status Achieved               Plan - 09/28/15 0826    Clinical Impression Statement Pt is progressing towards goals slowly and steadily with decreasing shoulder pain. Pt is no longer receiving private pay PT at another site.   Pt will benefit from skilled therapeutic intervention in order to improve on the following deficits (Retired) Abnormal gait;Decreased coordination;Decreased range of motion;Difficulty walking;Impaired flexibility;Decreased endurance;Decreased safety awareness;Increased edema;Impaired tone;Decreased knowledge of precautions;Decreased activity tolerance;Decreased balance;Decreased knowledge of use of DME;Pain;Impaired UE functional use;Impaired vision/preception;Decreased cognition;Decreased mobility;Decreased strength   OT Frequency 2x / week   OT Duration 8 weeks   OT Treatment/Interventions Self-care/ADL training;Therapeutic exercise;Patient/family education;Balance training;Ultrasound;Neuromuscular education;Manual Therapy;Splinting;Therapeutic exercises;Energy conservation;Parrafin;DME and/or AE instruction;Therapeutic activities;Cognitive remediation/compensation;Gait Training;Fluidtherapy;Electrical Stimulation;Moist Heat;Contrast Bath;Passive range of  motion;Visual/perceptual remediation/compensation   Plan NMR, low range functional reaching.   Consulted and Agree with Plan of Care Patient        Problem List Patient Active Problem List   Diagnosis Date Noted  . Spastic hemiplegia and hemiparesis affecting dominant side ([place]) 09/13/2015  . Adhesive capsulitis of right shoulder 08/24/2015  . Symptomatic partial epilepsy with simple partial seizures ([place]) 08/21/2015  . Nontraumatic cortical hemorrhage of cerebral hemisphere ([place])   . Seizure ([place])   . Seizures ([place])   . Adjustment disorder with depressed mood   . Contracture of muscle ankle and foot 06/11/2015  . Hemiplga fol ntrm intcrbl hemor aff right dominant side 06/06/2015  . Left-sided intracerebral hemorrhage ([place]) 06/04/2015  . Seizure disorder as sequela of cerebrovascular accident ([place])   .  Seizure disorder ([place]) 06/03/2015  . Sepsis ([place]) 06/03/2015  . UTI (urinary tract infection) 06/03/2015  . Hypotension 06/02/2015  . Right spastic hemiparesis ([place]) 05/28/2015  . 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 History of anxiety disorder 05/28/2015  . ICH (intracerebral hemorrhage) ([place])   . Seizure disorder, nonconvulsive, with status epilepticus ([place])   . Cerebral venous thrombosis of cortical vein   . Cytotoxic cerebral edema ([place])   . IVH (intraventricular hemorrhage) ([place])   . Term pregnancy 05/07/2015  . Spontaneous vaginal delivery 05/07/2015    Zyaire Mccleod 09/28/2015, 8:32 AM Theone Murdoch, OTR/L [phone] Phone: [phone] 8:32 AM 09/28/2015 [facility] [address] [place], [place], [address] Phone: [phone]   Fax:  [phone]  Name: Ashley [name] MRN: [id number] Date of Birth: [birth year]

## 2015-10-02 ENCOUNTER — Ambulatory Visit: Payer: BLUE CROSS/BLUE SHIELD | Admitting: Occupational Therapy

## 2015-10-02 ENCOUNTER — Ambulatory Visit: Payer: BLUE CROSS/BLUE SHIELD | Admitting: Physical Therapy

## 2015-10-02 DIAGNOSIS — R279 Unspecified lack of coordination: Secondary | ICD-10-CM

## 2015-10-02 DIAGNOSIS — R252 Cramp and spasm: Secondary | ICD-10-CM

## 2015-10-02 DIAGNOSIS — M25511 Pain in right shoulder: Secondary | ICD-10-CM

## 2015-10-02 DIAGNOSIS — R269 Unspecified abnormalities of gait and mobility: Secondary | ICD-10-CM

## 2015-10-02 DIAGNOSIS — R29898 Other symptoms and signs involving the musculoskeletal system: Secondary | ICD-10-CM

## 2015-10-02 DIAGNOSIS — IMO0002 Reserved for concepts with insufficient information to code with codable children: Secondary | ICD-10-CM

## 2015-10-02 DIAGNOSIS — R278 Other lack of coordination: Secondary | ICD-10-CM

## 2015-10-02 DIAGNOSIS — G8111 Spastic hemiplegia affecting right dominant side: Secondary | ICD-10-CM

## 2015-10-02 NOTE — Patient Instructions (Signed)
Sit on a low surface hold cane behind your back, slowly raise cane back while squeezing shoulder blades together, then bring cane towards your body, 10 -20 reps 1-2x day

## 2015-10-02 NOTE — Therapy (Signed)
[facility] [address] [place], [place], [address] Phone: [phone]   Fax:  [phone]  Occupational Therapy Treatment  Patient Details  Name: Ashley [name] MRN: [id number] Date of Birth: 1976-01-25 No Data Recorded  Encounter Date: 10/02/2015      OT End of Session - 10/02/15 1647    Visit Number 26   Number of Visits 34   Date for OT Re-Evaluation 10/27/15   Authorization Type BCBS   Authorization Time Period 30 visit OT- week 4/8   Authorization - Visit Number 26   Authorization - Number of Visits 30   OT Start Time 1219   OT Stop Time 1615   OT Time Calculation (min) 38 min   Activity Tolerance Patient tolerated treatment well   Behavior During Therapy [facility] for tasks assessed/performed      Past Medical History  Diagnosis Date  . Anxiety   . ICH (intracerebral hemorrhage) ([place])   . Stroke [facility])     Past Surgical History  Procedure Laterality Date  . Dilation and curettage of uterus      There were no vitals filed for this visit.  Visit Diagnosis:  Spasticity  Decreased coordination  Pain in joint of right shoulder  Right spastic hemiparesis ([facility])  Weakness due to cerebrovascular accident      Subjective Assessment - 10/02/15 1646    Subjective  Pt reports no pain   Pertinent History see epic snapshot; pt with SAH 2 weeks after deliverying her 3rd child   Patient Stated Goals to be able to use my R arm normally   Currently in Pain? No/denies        Treatment: supine, closed chain shoulder flexion with PVC pipe frame, followed by seated weightbearing through elbow then hand with lateral trunk flexion, and weight shifts, with therapist. facilitating RUE shoulder / scapular alignment. quadraped cat and cow positions, then lateral weight shifts and rocking forwards and backwards, with min facilitation for increased RUE shoulder and scapular activation UE ranger in standing with  therapistt facilitating weight shift to RLE with low range AA/ROM. SEAted shoulder flexion low range with medium ball, min facilitation for shoulder positioning, shoulder extension in seated issued as HEP.                        OT Short Term Goals - 10/02/15 1651    OT SHORT TERM GOAL #1   Title I with HEP.   Status Achieved   OT SHORT TERM GOAL #2   Title Pt will demonstrate impoved fine motor coordinationas evidenced by decreasing RUE 9 hole peg test score to 35 secs or less.   Baseline 28.44 secs   Status Achieved   OT SHORT TERM GOAL #3   Title Pt will perform all basic ADLs at a supervision level.   Status Achieved   OT SHORT TERM GOAL #4   Title Pt will use RUE as an active assist for ADLS/IADLS at least 50 % of the time.   Status Achieved   OT SHORT TERM GOAL #5   Title Pt will perform basic home management/ cooking at min A level.   Status Achieved   OT SHORT TERM GOAL #6   Title Pt will perform home management/ cooking at a supervision level consistently.   Baseline due 09/27/15 , pt is performing yet not consistent   Time 4   Period Weeks   Status Partially Met   OT SHORT  TERM GOAL #7   Title Pt will demonstrate ability to retrieve a lightweight object at 95* with pain less than or equal to 3/10.   Time 4   Period Weeks   Status On-going           OT Long Term Goals - 08/28/15 1929    OT LONG TERM GOAL #1   Title Pt will perfom all basic ADLS modified independently. due 10/27/15   Baseline min A with washing hair, difficulty with hygeine    Time 8   Period Weeks   Status On-going   OT LONG TERM GOAL #2   Title Pt will perform basic home management/ cooking at a supervision level demonstrating good safety awareness.- not met (now STG)   Baseline revised long term goal- modified independent with cooking and home management with pt demonstrating good safety awareness   Time 8   Period Weeks   Status On-going   OT LONG TERM GOAL #3   Title  Pt will demonstrate ability to retrieve a 3 lbs weight at 120 x 5 reps without dropping with RUE   Baseline goal deferred due to0 shoulder pain   Time 8   Period Weeks   Status Not Met   OT LONG TERM GOAL #4   Title Pt will resume use of RUE as dominant hand at least 75% of the time for ADLs/ IADLs. not met 30% goal revised   Baseline revised goal- Pt will use RUE as an active assist 75% of the time    Time 8   Status On-going   OT LONG TERM GOAL #5   Title Pt will demonstrate ability to perform a physical and cognitive task simultaneously with 80% or better accuracy.   Baseline not fully addressed due to recent shoulder pain-    Time 8   Status Deferred   Long Term Additional Goals   Additional Long Term Goals Yes   OT LONG TERM GOAL #6   Title Pt will demonstrate improved fine motor coordination as evidenced by decreasing RUE 9 hole peg test score to 30 secs or less.   Baseline 25.47 secs   Time 8   Status Achieved               Plan - 10/02/15 1644    Clinical Impression Statement Pt is progressing towards goals. She is pain free now yet limited by abnormal movment patterns and spasticity.   Pt will benefit from skilled therapeutic intervention in order to improve on the following deficits (Retired) Abnormal gait;Decreased coordination;Decreased range of motion;Difficulty walking;Impaired flexibility;Decreased endurance;Decreased safety awareness;Increased edema;Impaired tone;Decreased knowledge of precautions;Decreased activity tolerance;Decreased balance;Decreased knowledge of use of DME;Pain;Impaired UE functional use;Impaired vision/preception;Decreased cognition;Decreased mobility;Decreased strength   Rehab Potential Good   OT Frequency 2x / week   OT Duration 8 weeks   OT Treatment/Interventions Self-care/ADL training;Therapeutic exercise;Patient/family education;Balance training;Ultrasound;Neuromuscular education;Manual Therapy;Splinting;Therapeutic exercises;Energy  conservation;Parrafin;DME and/or AE instruction;Therapeutic activities;Cognitive remediation/compensation;Gait Training;Fluidtherapy;Electrical Stimulation;Moist Heat;Contrast Bath;Passive range of motion;Visual/perceptual remediation/compensation   Plan NMR, low range functional reaching   OT Home Exercise Plan issued: ball ex in supine, trunk for A-p tilts with trunk rotation(07/05/15)   Consulted and Agree with Plan of Care Patient        Problem List Patient Active Problem List   Diagnosis Date Noted  . Spastic hemiplegia and hemiparesis affecting dominant side ([place]) 09/13/2015  . Adhesive capsulitis of right shoulder 08/24/2015  . Symptomatic partial epilepsy with simple partial seizures ([place]) 08/21/2015  . Nontraumatic cortical  hemorrhage of cerebral hemisphere ([place])   . Seizure ([place])   . Seizures ([place])   . Adjustment disorder with depressed mood   . Contracture of muscle ankle and foot 06/11/2015  . Hemiplga fol ntrm intcrbl hemor aff right dominant side 06/06/2015  . Left-sided intracerebral hemorrhage ([place]) 06/04/2015  . Seizure disorder as sequela of cerebrovascular accident ([place])   . Seizure disorder ([place]) 06/03/2015  . Sepsis ([place]) 06/03/2015  . UTI (urinary tract infection) 06/03/2015  . Hypotension 06/02/2015  . Right spastic hemiparesis ([place]) 05/28/2015  . 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 History of anxiety disorder 05/28/2015  . ICH (intracerebral hemorrhage) ([place])   . Seizure disorder, nonconvulsive, with status epilepticus ([place])   . Cerebral venous thrombosis of cortical vein   . Cytotoxic cerebral edema ([place])   . IVH (intraventricular hemorrhage) ([place])   . Term pregnancy 05/07/2015  . Spontaneous vaginal delivery 05/07/2015    , 10/02/2015, 4:52 PM Theone Murdoch, OTR/L [phone] Phone: [phone] 4:52 PM 10/02/2015 [facility] [address]  [place], [place], [address] Phone: [phone]   Fax:  [phone]  Name: Ashley [name] MRN: [id number] Date of Birth: 02-18-1976

## 2015-10-03 ENCOUNTER — Encounter: Payer: Self-pay | Admitting: Physical Therapy

## 2015-10-03 DIAGNOSIS — Z0271 Encounter for disability determination: Secondary | ICD-10-CM

## 2015-10-03 NOTE — Therapy (Signed)
[facility] [address] [place], [place], [address] Phone: [phone]   Fax:  [phone]  Physical Therapy Treatment  Patient Details  Name: Ashley [name] MRN: [id number] Date of Birth: 1976-02-16 No Data Recorded  Encounter Date: 10/02/2015      PT End of Session - 10/03/15 1606    Visit Number 26   Number of Visits 39   Date for PT Re-Evaluation 09/26/15   Authorization Type BCBS. Per Laverda Page, pt gets 30 visits for PT and 30 for OT.   Authorization - Visit Number 25   Authorization - Number of Visits 30   PT Start Time 5732   PT Stop Time 1530   PT Time Calculation (min) 45 min      Past Medical History  Diagnosis Date  . Anxiety   . ICH (intracerebral hemorrhage) ([place])   . Stroke [facility])     Past Surgical History  Procedure Laterality Date  . Dilation and curettage of uterus      There were no vitals filed for this visit.  Visit Diagnosis:  Right spastic hemiparesis ([facility])  Right leg weakness  Abnormality of gait      Subjective Assessment - 10/03/15 1602    Subjective Pt states she wants to update and review her HEP  "I have alot but I try to do them"; wants to be able to put more weight on R leg when walking   Patient is accompained by: Family member   Pertinent History Seizures, low BP   Patient Stated Goals "Be as close to normal as much as possible and to go back to running" Get back to taking care of my kids, 30 weeks old and 68 and 80 years old   Currently in Pain? No/denies     TherEx:   Reviewed pt's HEP - modified as appropriate; changed R hamstring stretch to sitting with R leg straight out in front rather than R foot on floor with pt leadning forward; progressed R heel cord stretch to stadning position with use of 2"-3" step Added SLR - 2# weight used - pt performed 10 reps with emphasis on eccentric control; R hip abduction in L sidelying position with 2# weight placed above knee  - 10 reps R knee flexion prone with 2# weight x 10 reps; Pt performed R hip extension with knee flexed at 90 degrees with min to mod assist to maintain knee flexion - 10 reps; (ex. Not added to HEP at this time) R hip extension control on/off mat with 2# weight - performed 10 reps with min assist as needed for control (not added to HEP - performed during PT session)    Pt performed bridging x 10 reps, bridging with hip abduction/adduction, bridging with LLE extension and bridging with marching x 10 reps each - these added to HEP                        PT Education - 10/03/15 1606    Education provided Yes   Education Details see pt instructions   Person(s) Educated Patient   Methods Explanation;Demonstration;Handout   Comprehension Verbalized understanding;Returned demonstration          PT Short Term Goals - 08/23/15 1203    PT SHORT TERM GOAL #1   Title Pt will be IND in HEP to improve strength, balance, and endurance. Target date: 07/27/15.   Status Partially Met   PT SHORT TERM GOAL #2  Title Pt will improve gait speed to >/=1.37f/sec to decrease falls risk. Target date: 07/27/15.   Status Achieved   PT SHORT TERM GOAL #3   Title Pt will ambulate 300' with LRAD, over even terrain, at MOD I level to improve functional mobility. Target date: 07/27/15.   Status Achieved   PT SHORT TERM GOAL #4   Title Pt will perform TUG in </=13.5 seconds with LRAD to decr. falls risk. Target date: 07/27/15.   Status Partially Met   PT SHORT TERM GOAL #5   Title Perform BERG and write STG and LTG as appropriate. Target date: 07/27/15.   Status Achieved   PT SHORT TERM GOAL #6   Title Pt will improve BERG to >/=51/56 to decr. falls risk. Target date: 07/27/15.   Status Achieved           PT Long Term Goals - 09/27/15 1209    PT LONG TERM GOAL #1   Title Pt will ambulate 1000' over even/uneven terrain, with LRAD, at MOD I level to improve functional mobiliity. Target  date: 11/12/14.   Baseline deferred to new 4 week POC. All unmet goals will be carried over to new 7 week POC: 11/12/14.   Status On-going   PT LONG TERM GOAL #2   Title Pt will amb. 300' over even terrain without an AD, IND, to safely amb. in home. Target date: 11/12/14.   Status On-going   PT LONG TERM GOAL #3   Title Pt will improve gait speed to >/=2.616fsec, with LRAD, to amb. safely in the community. Target date: 11/12/14.   Status On-going   PT LONG TERM GOAL #4   Title Pt will verbalize plans to join a fitness center upon d/c from PT to continue to maintain strength and endurance gains made during PT. Target date: 11/12/14.   Status On-going   PT LONG TERM GOAL #5   Title Pt improve BERG score to >/=55/56 to decr. falls risk. Target date: 11/12/14.   Status On-going               Plan - 10/03/15 1607    Clinical Impression Statement Pt has decreased R hip extension with knee flexion control and has decr. coordination of RLE due to weakness and spasticity; concentric R hip flexors stronger than with eccentric control   Pt will benefit from skilled therapeutic intervention in order to improve on the following deficits Abnormal gait;Decreased endurance;Impaired sensation;Decreased knowledge of precautions;Decreased activity tolerance;Decreased knowledge of use of DME;Decreased strength;Impaired UE functional use;Impaired tone;Decreased balance;Decreased mobility;Decreased cognition;Decreased range of motion;Decreased safety awareness;Decreased coordination;Impaired flexibility   Rehab Potential Good   Clinical Impairments Affecting Rehab Potential Seizures which may limit intensity of PT   PT Frequency 2x / week   PT Treatment/Interventions ADLs/Self Care Home Management;Neuromuscular re-education;Cognitive remediation;Biofeedback;DME Instruction;Gait training;Stair training;Canalith Repostioning;Patient/family education;Orthotic Fit/Training;Balance training;Therapeutic  exercise;Manual techniques;Therapeutic activities;Vestibular   PT Next Visit Plan cont strenghening - begin R SLS activities and activitiies to increase weight shift onto RLE   PT Home Exercise Plan Stretching/strength/balance HEP   Consulted and Agree with Plan of Care Patient        Problem List Patient Active Problem List   Diagnosis Date Noted  . Spastic hemiplegia and hemiparesis affecting dominant side (HCPine Hills12/10/2014  . Adhesive capsulitis of right shoulder 08/24/2015  . Symptomatic partial epilepsy with simple partial seizures ([place]CO[place]11/05/2015  . Nontraumatic cortical hemorrhage of cerebral hemisphere ([facility]  . Seizure ([facility]  . Seizures ([facility]  .  Adjustment disorder with depressed mood   . Contracture of muscle ankle and foot 06/11/2015  . Hemiplga fol ntrm intcrbl hemor aff right dominant side 06/06/2015  . Left-sided intracerebral hemorrhage ([place]) 06/04/2015  . Seizure disorder as sequela of cerebrovascular accident ([place])   . Seizure disorder ([place]) 06/03/2015  . Sepsis ([place]) 06/03/2015  . UTI (urinary tract infection) 06/03/2015  . Hypotension 06/02/2015  . Right spastic hemiparesis ([place]) 05/28/2015  . 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 History of anxiety disorder 05/28/2015  . ICH (intracerebral hemorrhage) ([place])   . Seizure disorder, nonconvulsive, with status epilepticus ([place])   . Cerebral venous thrombosis of cortical vein   . Cytotoxic cerebral edema ([place])   . IVH (intraventricular hemorrhage) ([place])   . Term pregnancy 05/07/2015  . Spontaneous vaginal delivery 05/07/2015    Alda Lea, PT 10/03/2015, 4:10 PM  [facility] [address] [place], [place], [address] Phone: [phone]   Fax:  [phone]  Name: Gerardine Peltz MRN: [id number] Date of Birth: 05-22-1976

## 2015-10-03 NOTE — Patient Instructions (Signed)
Added bridging with LLE extension, bridging with marching and bridging with hip abduction/adduction; added SLR and hip abduction with 2# weight (LYING supine and on L side respectively for anti-gravity);  Added hanmstring stretch with R leg straight out on level surface rather than with foot on floor and pt leaning forward

## 2015-10-04 ENCOUNTER — Ambulatory Visit: Payer: BLUE CROSS/BLUE SHIELD | Admitting: Occupational Therapy

## 2015-10-04 ENCOUNTER — Other Ambulatory Visit: Payer: Self-pay | Admitting: Physical Medicine & Rehabilitation

## 2015-10-04 ENCOUNTER — Ambulatory Visit: Payer: BLUE CROSS/BLUE SHIELD

## 2015-10-04 DIAGNOSIS — G8111 Spastic hemiplegia affecting right dominant side: Secondary | ICD-10-CM

## 2015-10-04 DIAGNOSIS — R252 Cramp and spasm: Secondary | ICD-10-CM

## 2015-10-04 DIAGNOSIS — R279 Unspecified lack of coordination: Secondary | ICD-10-CM | POA: Diagnosis not present

## 2015-10-04 DIAGNOSIS — M25511 Pain in right shoulder: Secondary | ICD-10-CM

## 2015-10-04 DIAGNOSIS — R269 Unspecified abnormalities of gait and mobility: Secondary | ICD-10-CM

## 2015-10-04 DIAGNOSIS — IMO0002 Reserved for concepts with insufficient information to code with codable children: Secondary | ICD-10-CM

## 2015-10-04 DIAGNOSIS — R278 Other lack of coordination: Secondary | ICD-10-CM

## 2015-10-04 NOTE — Therapy (Signed)
[facility] [address] [place], [place], [address] Phone: [phone]   Fax:  [phone]  Physical Therapy Treatment  Patient Details  Name: Ashley Ashley [name] MRN: [id number] Date of Birth: 1975/11/22 No Data Recorded  Encounter Date: 10/04/2015      PT End of Session - 10/04/15 1516    Visit Number 27   Number of Visits 39   Date for PT Re-Evaluation 11/13/15   Authorization Type BCBS. Per Ashley Ashley [name], pt gets 30 visits for PT and 30 for OT.   Authorization - Visit Number 26   Authorization - Number of Visits 30   PT Start Time [phone]   PT Stop Time 1015   PT Time Calculation (min) 44 min   Equipment Utilized During Treatment Gait belt  and BWSTT harness   Activity Tolerance Patient tolerated treatment well   Behavior During Therapy WFL for tasks assessed/performed      Past Medical History  Diagnosis Date  . Anxiety   . ICH (intracerebral hemorrhage) ([place])   . Stroke [facility])     Past Surgical History  Procedure Laterality Date  . Dilation and curettage of uterus      There were no vitals filed for this visit.  Visit Diagnosis:  Abnormality of gait      Subjective Assessment - 10/04/15 0935    Subjective Pt denied falls or changes since last visit. Pt reported her R arm is tired today, as she thinks she overdid it yesterday with her HEP.   Pertinent History Seizures, low BP   Patient Stated Goals "Be as close to normal as much as possible and to go back to running" Get back to taking care of my kids, 34 weeks old and 49 and 41 years old   Currently in Pain? No/denies                         [facility] Adult PT Treatment/Exercise - 10/04/15 5035    Ambulation/Gait   Ambulation/Gait Yes   Ambulation/Gait Assistance 5: Supervision;Other (comment)  BWSTT harness   Ambulation/Gait Assistance Details Pt performed BWSTT for 12 minutes and then overground amb. Pt required seated rest break and water  break 2/2 fatigue. Cues to improve Ashley [name] heel strike, upright posture, and to improve stride length. pt required one seated rest break 2/2 fatigue. HR range: 112-121bpm during session.   Ambulation Distance (Feet) --  BWSTT for 12 minutes and 30', 75'x2, 50'   Assistive device Straight cane;Body weight support system   Gait Pattern Decreased step length - right;Decreased stance time - right;Decreased weight shift to right;Right foot flat;Decreased hip/knee flexion - right   Ambulation Surface Level;Indoor   Number of Stairs 4  cues for sequencing and safety.   Height of Stairs 6   Door Management Other (comment)  min guard                PT Education - 10/04/15 1514    Education provided Yes   Education Details PTdiscussed the importance of using SPC at all times to decr. R shoulder shrug and R elbow flexion during gait. PT also spoke with pt and OT regarding pt practicing stairs using Ashley [name] hand on rail to ascend and R hand to descend, with pt's husband assisting for safety. OT and pt agreeable. PT provided pt with green theraband for terminal knee ext HEP, as pt reported red band was too easy.   Person(s) Educated Patient  Methods Explanation   Comprehension Verbalized understanding          PT Short Term Goals - 08/23/15 1203    PT SHORT TERM GOAL #1   Title Pt will be IND in HEP to improve strength, balance, and endurance. Target date: 07/27/15.   Status Partially Met   PT SHORT TERM GOAL #2   Title Pt will improve gait speed to >/=1.40f/sec to decrease falls risk. Target date: 07/27/15.   Status Achieved   PT SHORT TERM GOAL #3   Title Pt will ambulate 300' with LRAD, over even terrain, at MOD I level to improve functional mobility. Target date: 07/27/15.   Status Achieved   PT SHORT TERM GOAL #4   Title Pt will perform TUG in </=13.5 seconds with LRAD to decr. falls risk. Target date: 07/27/15.   Status Partially Met   PT SHORT TERM GOAL #5   Title Perform BERG and write  STG and LTG as appropriate. Target date: 07/27/15.   Status Achieved   PT SHORT TERM GOAL #6   Title Pt will improve BERG to >/=51/56 to decr. falls risk. Target date: 07/27/15.   Status Achieved           PT Long Term Goals - 09/27/15 1209    PT LONG TERM GOAL #1   Title Pt will ambulate 1000' over even/uneven terrain, with LRAD, at MOD I level to improve functional mobiliity. Target date: 11/12/14.   Baseline deferred to new 4 week POC. All unmet goals will be carried over to new 7 week POC: 11/12/14.   Status On-going   PT LONG TERM GOAL #2   Title Pt will amb. 300' over even terrain without an AD, IND, to safely amb. in home. Target date: 11/12/14.   Status On-going   PT LONG TERM GOAL #3   Title Pt will improve gait speed to >/=2.672fsec, with LRAD, to amb. safely in the community. Target date: 11/12/14.   Status On-going   PT LONG TERM GOAL #4   Title Pt will verbalize plans to join a fitness center upon d/c from PT to continue to maintain strength and endurance gains made during PT. Target date: 11/12/14.   Status On-going   PT LONG TERM GOAL #5   Title Pt improve BERG score to >/=55/56 to decr. falls risk. Target date: 11/12/14.   Status On-going               Plan - 10/04/15 1516    Clinical Impression Statement Pt demonstrated progress, as she was able to improve lateral wt. shifting onto R LE and improve stride length and Ashley [name] step length/heel strike. Pt noted to exhibit more fluid gait pattern during amb. At increased speed on treadmill from 1.0 to 1.79m29m Pt noted to amb. with decr. R shoulder shrug. Continue with POC.   Pt will benefit from skilled therapeutic intervention in order to improve on the following deficits Abnormal gait;Decreased endurance;Impaired sensation;Decreased knowledge of precautions;Decreased activity tolerance;Decreased knowledge of use of DME;Decreased strength;Impaired UE functional use;Impaired tone;Decreased balance;Decreased mobility;Decreased  cognition;Decreased range of motion;Decreased safety awareness;Decreased coordination;Impaired flexibility   Rehab Potential Good   Clinical Impairments Affecting Rehab Potential Seizures which may limit intensity of PT   PT Frequency 2x / week   PT Duration --  additional 7 weeks   PT Treatment/Interventions ADLs/Self Care Home Management;Neuromuscular re-education;Cognitive remediation;Biofeedback;DME Instruction;Gait training;Stair training;Canalith Repostioning;Patient/family education;Orthotic Fit/Training;Balance training;Therapeutic exercise;Manual techniques;Therapeutic activities;Vestibular   PT Next Visit Plan Balance ( R SLS) and  strengthening   PT Home Exercise Plan Stretching/strength/balance HEP   Consulted and Agree with Plan of Care Patient        Problem List Patient Active Problem List   Diagnosis Date Noted  . Spastic hemiplegia and hemiparesis affecting dominant side ([place]) 09/13/2015  . Adhesive capsulitis of right shoulder 08/24/2015  . Symptomatic partial epilepsy with simple partial seizures ([place]) 08/21/2015  . Nontraumatic cortical hemorrhage of cerebral hemisphere ([place])   . Seizure ([place])   . Seizures ([place])   . Adjustment disorder with depressed mood   . Contracture of muscle ankle and foot 06/11/2015  . Hemiplga fol ntrm intcrbl hemor aff right dominant side 06/06/2015  . Left-sided intracerebral hemorrhage ([place]) 06/04/2015  . Seizure disorder as sequela of cerebrovascular accident ([place])   . Seizure disorder ([place]) 06/03/2015  . Sepsis ([place]) 06/03/2015  . UTI (urinary tract infection) 06/03/2015  . Hypotension 06/02/2015  . Right spastic hemiparesis ([place]) 05/28/2015  . 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 History of anxiety disorder 05/28/2015  . ICH (intracerebral hemorrhage) ([place])   . Seizure disorder, nonconvulsive, with status epilepticus ([place])   . Cerebral venous thrombosis of cortical vein   . Cytotoxic cerebral  edema ([place])   . IVH (intraventricular hemorrhage) ([place])   . Term pregnancy 05/07/2015  . Spontaneous vaginal delivery 05/07/2015    Ashley Ashley [name] 10/04/2015, 3:18 PM  [facility] [address] [place], [place], [address] Phone: [phone]   Fax:  [phone]  Name: Ashley Ashley [name] MRN: [id number] Date of Birth: 09/29/1976    Geoffry Paradise, PT,DPT 10/04/2015 3:18 PM Phone: [phone] Fax: [phone]

## 2015-10-04 NOTE — Therapy (Signed)
[facility] [address] [place], [place], [address] Phone: [phone]   Fax:  [phone]  Occupational Therapy Treatment  Patient Details  Name: Ashley [name] MRN: [id number] Date of Birth: 07/10/1976 No Data Recorded  Encounter Date: 10/04/2015      OT End of Session - 10/04/15 1659    Visit Number 27   Number of Visits 34   Date for OT Re-Evaluation 10/27/15   Authorization Type BCBS   Authorization Time Period 30 visit OT- week 4/8   Authorization - Visit Number 61   Authorization - Number of Visits 30   OT Start Time 1018   OT Stop Time 1100   OT Time Calculation (min) 42 min   Activity Tolerance Patient tolerated treatment well   Behavior During Therapy [facility] for tasks assessed/performed      Past Medical History  Diagnosis Date  . Anxiety   . ICH (intracerebral hemorrhage) ([place])   . Stroke [facility])     Past Surgical History  Procedure Laterality Date  . Dilation and curettage of uterus      There were no vitals filed for this visit.  Visit Diagnosis:  Right spastic hemiparesis ([facility])  Spasticity  Decreased coordination  Pain in joint of right shoulder  Weakness due to cerebrovascular accident  Coordination impairment      Subjective Assessment - 10/04/15 1715    Pertinent History see epic snapshot; pt with [facility] 2 weeks after deliverying her 3rd child   Patient Stated Goals to be able to use my R arm normally   Pain Score 3    Pain Location Arm   Pain Orientation Right   Pain Descriptors / Indicators Sore   Pain Type Acute pain   Pain Frequency Intermittent   Aggravating Factors  exercising   Pain Relieving Factors rest      Treatment:  Closed chain shoulder flexion and chest press with ball in supine, min v.c./ facilitation, weightbearing through RUE edge of mat with lateral trunk flexion/ weight shifts, Weightbearing theough bilateral UE's in standing with hands on mat for cat/  cow, rock forwards and back and side to side, min facilitation UE Ranger for mid range shoulder flexion, min facilitation for weight bearing through RLE Seated low to mid range shoulder flexion with ball, min facilitation, less compensation noted in right shoulder.                          OT Short Term Goals - 10/02/15 1651    OT SHORT TERM GOAL #1   Title I with HEP.   Status Achieved   OT SHORT TERM GOAL #2   Title Pt will demonstrate impoved fine motor coordinationas evidenced by decreasing RUE 9 hole peg test score to 35 secs or less.   Baseline 28.44 secs   Status Achieved   OT SHORT TERM GOAL #3   Title Pt will perform all basic ADLs at a supervision level.   Status Achieved   OT SHORT TERM GOAL #4   Title Pt will use RUE as an active assist for ADLS/IADLS at least 50 % of the time.   Status Achieved   OT SHORT TERM GOAL #5   Title Pt will perform basic home management/ cooking at min A level.   Status Achieved   OT SHORT TERM GOAL #6   Title Pt will perform home management/ cooking at a supervision level consistently.   Baseline due  09/27/15 , pt is performing yet not consistent   Time 4   Period Weeks   Status Partially Met   OT SHORT TERM GOAL #7   Title Pt will demonstrate ability to retrieve a lightweight object at 95* with pain less than or equal to 3/10.   Time 4   Period Weeks   Status On-going           OT Long Term Goals - 08/28/15 1929    OT LONG TERM GOAL #1   Title Pt will perfom all basic ADLS modified independently. due 10/27/15   Baseline min A with washing hair, difficulty with hygeine    Time 8   Period Weeks   Status On-going   OT LONG TERM GOAL #2   Title Pt will perform basic home management/ cooking at a supervision level demonstrating good safety awareness.- not met (now STG)   Baseline revised long term goal- modified independent with cooking and home management with pt demonstrating good safety awareness   Time 8    Period Weeks   Status On-going   OT LONG TERM GOAL #3   Title Pt will demonstrate ability to retrieve a 3 lbs weight at 120 x 5 reps without dropping with RUE   Baseline goal deferred due to0 shoulder pain   Time 8   Period Weeks   Status Not Met   OT LONG TERM GOAL #4   Title Pt will resume use of RUE as dominant hand at least 75% of the time for ADLs/ IADLs. not met 30% goal revised   Baseline revised goal- Pt will use RUE as an active assist 75% of the time    Time 8   Status On-going   OT LONG TERM GOAL #5   Title Pt will demonstrate ability to perform a physical and cognitive task simultaneously with 80% or better accuracy.   Baseline not fully addressed due to recent shoulder pain-    Time 8   Status Deferred   Long Term Additional Goals   Additional Long Term Goals Yes   OT LONG TERM GOAL #6   Title Pt will demonstrate improved fine motor coordination as evidenced by decreasing RUE 9 hole peg test score to 30 secs or less.   Baseline 25.47 secs   Time 8   Status Achieved               Plan - 10/04/15 1657    Clinical Impression Statement Pt is progressing towards goals with improving functional use of RUE and decreased overall compensation at shoulder.   Pt will benefit from skilled therapeutic intervention in order to improve on the following deficits (Retired) Abnormal gait;Decreased coordination;Decreased range of motion;Difficulty walking;Impaired flexibility;Decreased endurance;Decreased safety awareness;Increased edema;Impaired tone;Decreased knowledge of precautions;Decreased activity tolerance;Decreased balance;Decreased knowledge of use of DME;Pain;Impaired UE functional use;Impaired vision/preception;Decreased cognition;Decreased mobility;Decreased strength   Rehab Potential Good   OT Frequency 2x / week   OT Duration 8 weeks   OT Treatment/Interventions Self-care/ADL training;Therapeutic exercise;Patient/family education;Balance  training;Ultrasound;Neuromuscular education;Manual Therapy;Splinting;Therapeutic exercises;Energy conservation;Parrafin;DME and/or AE instruction;Therapeutic activities;Cognitive remediation/compensation;Gait Training;Fluidtherapy;Electrical Stimulation;Moist Heat;Contrast Bath;Passive range of motion;Visual/perceptual remediation/compensation   Plan NMR, low range functional reaching   OT Home Exercise Plan issued: ball ex in supine, trunk for A-p tilts with trunk rotation(07/05/15)   Consulted and Agree with Plan of Care Patient        Problem List Patient Active Problem List   Diagnosis Date Noted  . Spastic hemiplegia and hemiparesis affecting dominant side ([place])  09/13/2015  . Adhesive capsulitis of right shoulder 08/24/2015  . Symptomatic partial epilepsy with simple partial seizures ([place]) 08/21/2015  . Nontraumatic cortical hemorrhage of cerebral hemisphere ([place])   . Seizure ([place])   . Seizures ([place])   . Adjustment disorder with depressed mood   . Contracture of muscle ankle and foot 06/11/2015  . Hemiplga fol ntrm intcrbl hemor aff right dominant side 06/06/2015  . Left-sided intracerebral hemorrhage ([place]) 06/04/2015  . Seizure disorder as sequela of cerebrovascular accident ([place])   . Seizure disorder ([place]) 06/03/2015  . Sepsis ([place]) 06/03/2015  . UTI (urinary tract infection) 06/03/2015  . Hypotension 06/02/2015  . Right spastic hemiparesis ([place]) 05/28/2015  . 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 History of anxiety disorder 05/28/2015  . ICH (intracerebral hemorrhage) ([place])   . Seizure disorder, nonconvulsive, with status epilepticus ([place])   . Cerebral venous thrombosis of cortical vein   . Cytotoxic cerebral edema ([place])   . IVH (intraventricular hemorrhage) ([place])   . Term pregnancy 05/07/2015  . Spontaneous vaginal delivery 05/07/2015    Ashley [name] 10/04/2015, 5:17 PM Theone Murdoch, OTR/L [phone] Phone: [phone] 5:17 PM 10/04/2015 [facility] [address] [place], [place], [address] Phone: [phone]   Fax:  [phone]  Name: Ashley [name] MRN: [id number] Date of Birth: 06-25-1976

## 2015-10-05 ENCOUNTER — Encounter: Payer: BLUE CROSS/BLUE SHIELD | Admitting: Occupational Therapy

## 2015-10-08 ENCOUNTER — Other Ambulatory Visit: Payer: Self-pay | Admitting: Physical Medicine & Rehabilitation

## 2015-10-09 ENCOUNTER — Telehealth: Payer: Self-pay

## 2015-10-09 ENCOUNTER — Other Ambulatory Visit: Payer: Self-pay | Admitting: Physical Medicine & Rehabilitation

## 2015-10-09 NOTE — Telephone Encounter (Signed)
Pt called stating she needed a refill on her Topamax. This was refilled on 10/04/15 and pharmacy has confirmed it. Made pt aware that pharmacy should have the new rx.

## 2015-10-15 ENCOUNTER — Other Ambulatory Visit: Payer: Self-pay | Admitting: Neurology

## 2015-10-16 ENCOUNTER — Ambulatory Visit: Payer: BLUE CROSS/BLUE SHIELD | Attending: Physical Medicine & Rehabilitation

## 2015-10-16 ENCOUNTER — Ambulatory Visit: Payer: BLUE CROSS/BLUE SHIELD | Admitting: Occupational Therapy

## 2015-10-16 ENCOUNTER — Encounter: Payer: Self-pay | Admitting: Occupational Therapy

## 2015-10-16 DIAGNOSIS — M25511 Pain in right shoulder: Secondary | ICD-10-CM | POA: Diagnosis present

## 2015-10-16 DIAGNOSIS — R29898 Other symptoms and signs involving the musculoskeletal system: Secondary | ICD-10-CM | POA: Insufficient documentation

## 2015-10-16 DIAGNOSIS — G8111 Spastic hemiplegia affecting right dominant side: Secondary | ICD-10-CM | POA: Insufficient documentation

## 2015-10-16 DIAGNOSIS — R279 Unspecified lack of coordination: Secondary | ICD-10-CM | POA: Diagnosis present

## 2015-10-16 DIAGNOSIS — R258 Other abnormal involuntary movements: Secondary | ICD-10-CM | POA: Diagnosis present

## 2015-10-16 DIAGNOSIS — R269 Unspecified abnormalities of gait and mobility: Secondary | ICD-10-CM | POA: Insufficient documentation

## 2015-10-16 DIAGNOSIS — R278 Other lack of coordination: Secondary | ICD-10-CM

## 2015-10-16 DIAGNOSIS — R531 Weakness: Secondary | ICD-10-CM | POA: Diagnosis present

## 2015-10-16 DIAGNOSIS — I69898 Other sequelae of other cerebrovascular disease: Secondary | ICD-10-CM | POA: Insufficient documentation

## 2015-10-16 DIAGNOSIS — IMO0002 Reserved for concepts with insufficient information to code with codable children: Secondary | ICD-10-CM

## 2015-10-16 NOTE — Therapy (Signed)
[facility] [address] [place], [place], [address] Phone: [phone]   Fax:  [phone]  Occupational Therapy Treatment  Patient Details  Name: Ashley [name] MRN: [id number] Date of Birth: [birth year] No Data Recorded  Encounter Date: 10/16/2015      OT End of Session - 10/16/15 0955    Visit Number 28   Number of Visits 34   Date for OT Re-Evaluation 10/27/15   Authorization Type BCBS   Authorization Time Period 30 visit OT- week 4/8   Authorization - Visit Number 1  new calendar year   Authorization - Number of Visits 30   OT Start Time 0845   OT Stop Time 0931   OT Time Calculation (min) 46 min   Activity Tolerance Patient tolerated treatment well      Past Medical History  Diagnosis Date  . Anxiety   . ICH (intracerebral hemorrhage) (HCC)   . Stroke [facility])     Past Surgical History  Procedure Laterality Date  . Dilation and curettage of uterus      There were no vitals filed for this visit.  Visit Diagnosis:  Right spastic hemiparesis (HCC)  Decreased coordination  Pain in joint of right shoulder  Weakness due to cerebrovascular accident  Coordination impairment      Subjective Assessment - 10/16/15 0851    Pertinent History see epic snapshot; pt with [facility] 2 weeks after deliverying her 3rd child   Patient Stated Goals to be able to use my R arm normally   Currently in Pain? Yes   Pain Score 3    Pain Location Shoulder   Pain Orientation Right   Pain Descriptors / Indicators Aching   Pain Type Chronic pain   Pain Onset In the past 7 days   Pain Frequency Intermittent   Aggravating Factors  when I try and raise my arm again   Pain Relieving Factors avoiding certain movements                      OT Treatments/Exercises (OP) - 10/16/15 0001    Neurological Re-education Exercises   Other Exercises 1 Pt arrived today and stated that she had shoulder pain again that  started about 4 days ago. Pt feels it is due to an excercise she was doing at home and that she overdid it.  Instructed pt to temporarily stop exercise for now (depression and shoulder extension with cane in standing) until alignment is improved in shoulder girdle. Pt verbalized understanding. Following manual therapy to realign shoulder girdle (pt with superior/anterior subluxation as well as scapula significantly abducted, depressed and downwardly rotated with little movement in scapula with attempted shoulder flexion), addressed bilateral overhead reach in closed chain activity first in supine and then in sitting with mod facilitation to actively alignn and gain length to allow improve overhead reach. Also emphasized grading of activity and "reaching with your hand vs your shoulder. "  By end of session pt reported 0/10 pain and able to reach 110* of pain free shoulder flexion in bilateral closed chain with mod facilitation and 90* of unilateral reach with RUE in sitting with min facilitaiton.  Activity needs to encourage protraction in low plane prior to starting shoulder flexion in order to address subluxation and to achieve active scapula on trunk.    Manual Therapy   Manual Therapy Joint mobilization;Soft tissue mobilization;Scapular mobilization   Manual therapy comments Pt with 3/10 pain today (see neuro re ed  section) - addresed reallignment of humeral head and scapula in supine and then in sidelying prior to active overhead reach in supine and then in sitting.                   OT Short Term Goals - 10/16/15 0943    OT SHORT TERM GOAL #1   Title I with HEP.- 07/28/15   Status Achieved   OT SHORT TERM GOAL #2   Title Pt will demonstrate impoved fine motor coordinationas evidenced by decreasing RUE 9 hole peg test score to 35 secs or less.   Baseline 28.44 secs   Status Achieved   OT SHORT TERM GOAL #3   Title Pt will perform all basic ADLs at a supervision level.   Status  Achieved   OT SHORT TERM GOAL #4   Title Pt will use RUE as an active assist for ADLS/IADLS at least 50 % of the time.   Status Achieved   OT SHORT TERM GOAL #5   Title Pt will perform basic home management/ cooking at min A level.   Status Achieved   OT SHORT TERM GOAL #6   Title Pt will perform home management/ cooking at a supervision level consistently.   Baseline due 09/27/15 , pt is performing yet not consistent   Time 4   Period Weeks   Status Partially Met   OT SHORT TERM GOAL #7   Title Pt will demonstrate ability to retrieve a lightweight object at 95* with pain less than or equal to 3/10.   Time 4   Period Weeks   Status On-going           OT Long Term Goals - 10/16/15 0943    OT LONG TERM GOAL #1   Title Pt will perfom all basic ADLS modified independently. due 10/27/15   Baseline min A with washing hair, difficulty with hygeine    Time 8   Period Weeks   Status On-going   OT LONG TERM GOAL #2   Title Pt will perform basic home management/ cooking at a supervision level demonstrating good safety awareness.- not met (now STG)   Baseline revised long term goal- modified independent with cooking and home management with pt demonstrating good safety awareness   Time 8   Period Weeks   Status On-going   OT LONG TERM GOAL #3   Title Pt will demonstrate ability to retrieve a 3 lbs weight at 120 x 5 reps without dropping with RUE   Baseline goal deferred due to0 shoulder pain   Time 8   Period Weeks   Status Not Met   OT LONG TERM GOAL #4   Title Pt will resume use of RUE as dominant hand at least 75% of the time for ADLs/ IADLs. not met 30% goal revised   Baseline revised goal- Pt will use RUE as an active assist 75% of the time    Time 8   Status On-going   OT LONG TERM GOAL #5   Title Pt will demonstrate ability to perform a physical and cognitive task simultaneously with 80% or better accuracy.   Baseline not fully addressed due to recent shoulder pain-     Time 8   Status Deferred   OT LONG TERM GOAL #6   Title Pt will demonstrate improved fine motor coordination as evidenced by decreasing RUE 9 hole peg test score to 30 secs or less.   Baseline 25.47 secs   Time 8  Status Achieved               Plan - 10/16/15 0954    Clinical Impression Statement Pt arrived today with pain in R shoulder - adjusted HEP and addressed pain and alignment with good success.    Pt will benefit from skilled therapeutic intervention in order to improve on the following deficits (Retired) Abnormal gait;Decreased coordination;Decreased range of motion;Difficulty walking;Impaired flexibility;Decreased endurance;Decreased safety awareness;Increased edema;Impaired tone;Decreased knowledge of precautions;Decreased activity tolerance;Decreased balance;Decreased knowledge of use of DME;Pain;Impaired UE functional use;Impaired vision/preception;Decreased cognition;Decreased mobility;Decreased strength   Rehab Potential Good   OT Frequency 2x / week   OT Duration 8 weeks   OT Treatment/Interventions Self-care/ADL training;Therapeutic exercise;Patient/family education;Balance training;Ultrasound;Neuromuscular education;Manual Therapy;Splinting;Therapeutic exercises;Energy conservation;Parrafin;DME and/or AE instruction;Therapeutic activities;Cognitive remediation/compensation;Gait Training;Fluidtherapy;Electrical Stimulation;Moist Heat;Contrast Bath;Passive range of motion;Visual/perceptual remediation/compensation   Plan address pain PRN, NMR for low to mid to beginning high reach, alignment, grading.   OT Home Exercise Plan issued: ball ex in supine, trunk for A-p tilts with trunk rotation(07/05/15)   Consulted and Agree with Plan of Care Patient        Problem List Patient Active Problem List   Diagnosis Date Noted  . Spastic hemiplegia and hemiparesis affecting dominant side ([place]) 09/13/2015  . Adhesive capsulitis of right shoulder 08/24/2015  . Symptomatic  partial epilepsy with simple partial seizures ([place]) 08/21/2015  . Nontraumatic cortical hemorrhage of cerebral hemisphere ([place])   . Seizure ([place])   . Seizures ([place])   . Adjustment disorder with depressed mood   . Contracture of muscle ankle and foot 06/11/2015  . Hemiplga fol ntrm intcrbl hemor aff right dominant side 06/06/2015  . Left-sided intracerebral hemorrhage ([place]) 06/04/2015  . Seizure disorder as sequela of cerebrovascular accident ([place])   . Seizure disorder ([place]) 06/03/2015  . Sepsis ([place]) 06/03/2015  . UTI (urinary tract infection) 06/03/2015  . Hypotension 06/02/2015  . Right spastic hemiparesis ([place]) 05/28/2015  . 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 History of anxiety disorder 05/28/2015  . ICH (intracerebral hemorrhage) ([place])   . Seizure disorder, nonconvulsive, with status epilepticus ([place])   . Cerebral venous thrombosis of cortical vein   . Cytotoxic cerebral edema ([place])   . IVH (intraventricular hemorrhage) ([place])   . Term pregnancy 05/07/2015  . Spontaneous vaginal delivery 05/07/2015    Quay Burow, OTR/L 10/16/2015, 9:59 AM  [facility] [address] [place], [place], [address] Phone: [phone]   Fax:  [phone]  Name: Ashley [name] MRN: [id number] Date of Birth: 1976/04/29

## 2015-10-16 NOTE — Therapy (Signed)
Ashley [name] [address] [place], [place], [address] Phone: [phone]   Fax:  [phone]  Physical Therapy Treatment  Patient Details  Name: Ashley [name] MRN: [id number] Date of Birth: July 27, 1976 No Data Recorded  Encounter Date: 10/16/2015      PT End of Session - 10/16/15 1030    Visit Number 28   Number of Visits 39   Date for PT Re-Evaluation 11/13/15   Authorization Type BCBS. Per Laverda Page, pt gets 30 visits for PT and 30 for OT. Pt is checking to see if visit # changes in 2017. (10/16/15).   Authorization - Visit Number 28   Authorization - Number of Visits --  +15 visits? 45 total   PT Start Time 0932   PT Stop Time 1014   PT Time Calculation (min) 42 min   Equipment Utilized During Treatment Gait belt   Activity Tolerance Patient tolerated treatment well   Behavior During Therapy WFL for tasks assessed/performed      Past Medical History  Diagnosis Date  . Anxiety   . ICH (intracerebral hemorrhage) ([place])   . Stroke [facility])     Past Surgical History  Procedure Laterality Date  . Dilation and curettage of uterus      There were no vitals filed for this visit.  Visit Diagnosis:  Abnormality of gait  Right leg weakness      Subjective Assessment - 10/16/15 0933    Subjective Pt denied falls or changes since last visit. Pt reported she has been using the cane at home more do decrease R UE flexion/shoulder shrug. [name] stated a friend (who is a PT) will come to their home this Friday to talk to [place].   Patient is accompained by: Family member   Pertinent History Seizures, low BP   Patient Stated Goals "Be as close to normal as much as possible and to go back to running" Get back to taking care of my kids, 41 weeks old and 61 and 43 years old   Currently in Pain? No/denies                         [facility] Adult PT Treatment/Exercise - 10/16/15 0935    Ambulation/Gait   Ambulation/Gait Yes   Ambulation/Gait Assistance 5: Supervision;4: Min guard   Ambulation/Gait Assistance Details Pt amb. while holding ball to decr. R shoulder shrug (2x230') and without ball and SPC for 230' with incr. R UE flexion and shoulder shrug and decr. L step length/heel strike. Pt amb. with SPC with cues to improve lat. wt. shifting during last 100' of amb. PT demonstrated lateral hip cues to pt's husband, so he could work with pt at home. Pt trialed foot up brace, but had difficulty with R DF and required incr. time during gait and noted to experience incr. R UE flexion and shoulder shrug.   Ambulation Distance (Feet) --  230'x2 with ball and 230' no SPC, 350' with [facility]   Assistive device Straight cane;None   Gait Pattern Decreased step length - right;Decreased stance time - right;Decreased weight shift to right;Right foot flat;Decreased hip/knee flexion - right   Ambulation Surface Level;Indoor           Self Care:     PT Education - 10/16/15 1028    Education provided Yes   Education Details PT discussed proper amb. technique at home, with [facility] to pt's husband. PT also encouraged pt's husband to  assist pt traversing stairs to ensure proper technique and safety. PT reiterated the importance of performing HEP at home and to not "over do it" at home, as this will incr. R UE flexion and shoulder shrug 2/2 fatigue. PT reiterated the importance of always using R AFO and SPC during amb.   Person(s) Educated Patient;Spouse   Methods Explanation;Demonstration;Tactile cues;Verbal cues   Comprehension Verbalized understanding;Returned demonstration          PT Short Term Goals - 08/23/15 1203    PT SHORT TERM GOAL #1   Title Pt will be IND in HEP to improve strength, balance, and endurance. Target date: 07/27/15.   Status Partially Met   PT SHORT TERM GOAL #2   Title Pt will improve gait speed to >/=1.14ft/sec to decrease falls risk. Target date: 07/27/15.   Status Achieved   PT  SHORT TERM GOAL #3   Title Pt will ambulate 300' with LRAD, over even terrain, at MOD I level to improve functional mobility. Target date: 07/27/15.   Status Achieved   PT SHORT TERM GOAL #4   Title Pt will perform TUG in </=13.5 seconds with LRAD to decr. falls risk. Target date: 07/27/15.   Status Partially Met   PT SHORT TERM GOAL #5   Title Perform BERG and write STG and LTG as appropriate. Target date: 07/27/15.   Status Achieved   PT SHORT TERM GOAL #6   Title Pt will improve BERG to >/=51/56 to decr. falls risk. Target date: 07/27/15.   Status Achieved           PT Long Term Goals - 09/27/15 1209    PT LONG TERM GOAL #1   Title Pt will ambulate 1000' over even/uneven terrain, with LRAD, at MOD I level to improve functional mobiliity. Target date: 11/12/14.   Baseline deferred to new 4 week POC. All unmet goals will be carried over to new 7 week POC: 11/12/14.   Status On-going   PT LONG TERM GOAL #2   Title Pt will amb. 300' over even terrain without an AD, IND, to safely amb. in home. Target date: 11/12/14.   Status On-going   PT LONG TERM GOAL #3   Title Pt will improve gait speed to >/=2.49ft/sec, with LRAD, to amb. safely in the community. Target date: 11/12/14.   Status On-going   PT LONG TERM GOAL #4   Title Pt will verbalize plans to join a fitness center upon d/c from PT to continue to maintain strength and endurance gains made during PT. Target date: 11/12/14.   Status On-going   PT LONG TERM GOAL #5   Title Pt improve BERG score to >/=55/56 to decr. falls risk. Target date: 11/12/14.   Status On-going               Plan - 10/16/15 1033    Clinical Impression Statement Pt demonstrated progress during amb. when pt carried lightweigh ball to decr. R shoulder shrug and R UE flexion, pt demonstrated improve wt. shifting onto R LE which improved L step length and heel strike. PT spent time explaining POC and the importance of HEP with pt's husband, to reinforce  proper technique at home.    Pt will benefit from skilled therapeutic intervention in order to improve on the following deficits Abnormal gait;Decreased endurance;Impaired sensation;Decreased knowledge of precautions;Decreased activity tolerance;Decreased knowledge of use of DME;Decreased strength;Impaired UE functional use;Impaired tone;Decreased balance;Decreased mobility;Decreased cognition;Decreased range of motion;Decreased safety awareness;Decreased coordination;Impaired flexibility   Rehab Potential  Good   Clinical Impairments Affecting Rehab Potential Seizures which may limit intensity of PT   PT Frequency 2x / week   PT Duration --  7 week   PT Treatment/Interventions ADLs/Self Care Home Management;Neuromuscular re-education;Cognitive remediation;Biofeedback;DME Instruction;Gait training;Stair training;Canalith Repostioning;Patient/family education;Orthotic Fit/Training;Balance training;Therapeutic exercise;Manual techniques;Therapeutic activities;Vestibular   PT Next Visit Plan BWSTT and overground amb.   PT Home Exercise Plan Stretching/strength/balance HEP   Consulted and Agree with Plan of Care Patient   Family Member Consulted pt's husband-[name]        Problem List Patient Active Problem List   Diagnosis Date Noted  . Spastic hemiplegia and hemiparesis affecting dominant side ([place]) 09/13/2015  . Adhesive capsulitis of right shoulder 08/24/2015  . Symptomatic partial epilepsy with simple partial seizures ([place]) 08/21/2015  . Nontraumatic cortical hemorrhage of cerebral hemisphere ([place])   . Seizure ([place])   . Seizures ([place])   . Adjustment disorder with depressed mood   . Contracture of muscle ankle and foot 06/11/2015  . Hemiplga fol ntrm intcrbl hemor aff right dominant side 06/06/2015  . Left-sided intracerebral hemorrhage ([place]) 06/04/2015  . Seizure disorder as sequela of cerebrovascular accident ([place])   . Seizure disorder ([place]) 06/03/2015  . Sepsis ([place]) 06/03/2015  .  UTI (urinary tract infection) 06/03/2015  . Hypotension 06/02/2015  . Right spastic hemiparesis ([place]) 05/28/2015  . 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 History of anxiety disorder 05/28/2015  . ICH (intracerebral hemorrhage) ([place])   . Seizure disorder, nonconvulsive, with status epilepticus ([place])   . Cerebral venous thrombosis of cortical vein   . Cytotoxic cerebral edema ([place])   . IVH (intraventricular hemorrhage) ([place])   . Term pregnancy 05/07/2015  . Spontaneous vaginal delivery 05/07/2015    Kam Rahimi L 10/16/2015, 10:36 AM  [facility] [address] [place], [place], [address] Phone: [phone]   Fax:  [phone]  Name: Verbena Boeding MRN: [id number] Date of Birth: 09/25/1976    Geoffry Paradise, PT,DPT 10/16/2015 10:37 AM Phone: [phone] Fax: [phone]

## 2015-10-18 ENCOUNTER — Ambulatory Visit: Payer: BLUE CROSS/BLUE SHIELD

## 2015-10-18 ENCOUNTER — Encounter: Payer: Self-pay | Admitting: Occupational Therapy

## 2015-10-18 ENCOUNTER — Ambulatory Visit: Payer: BLUE CROSS/BLUE SHIELD | Admitting: Occupational Therapy

## 2015-10-18 DIAGNOSIS — R279 Unspecified lack of coordination: Secondary | ICD-10-CM

## 2015-10-18 DIAGNOSIS — R269 Unspecified abnormalities of gait and mobility: Secondary | ICD-10-CM

## 2015-10-18 DIAGNOSIS — R29898 Other symptoms and signs involving the musculoskeletal system: Secondary | ICD-10-CM

## 2015-10-18 DIAGNOSIS — R278 Other lack of coordination: Secondary | ICD-10-CM

## 2015-10-18 DIAGNOSIS — G8111 Spastic hemiplegia affecting right dominant side: Secondary | ICD-10-CM

## 2015-10-18 DIAGNOSIS — M25511 Pain in right shoulder: Secondary | ICD-10-CM

## 2015-10-18 NOTE — Therapy (Signed)
[facility] [address] [place], [place], [address] Phone: [phone]   Fax:  [phone]  Physical Therapy Treatment  Patient Details  Name: Ashley [name] MRN: [id number] Date of Birth: [birth year] No Data Recorded  Encounter Date: 10/18/2015      PT End of Session - 10/18/15 1026    Visit Number 29   Number of Visits 47   Authorization Type BCBS. Per Laverda Page, pt gets 30 visits for PT and 30 for OT. Pt is checking to see if visit # changes in 2017. (10/16/15).   Authorization - Visit Number 29   Authorization - Number of Visits --  30+15? for 45 total, pt is checking visit limit   PT Start Time [id number]  pt with OT   PT Stop Time 1014   PT Time Calculation (min) 40 min   Equipment Utilized During Treatment Gait belt;Other (comment)  and BWSTT harness   Activity Tolerance Patient tolerated treatment well   Behavior During Therapy [facility] for tasks assessed/performed      Past Medical History  Diagnosis Date  . Anxiety   . ICH (intracerebral hemorrhage) ([place])   . Stroke [facility])     Past Surgical History  Procedure Laterality Date  . Dilation and curettage of uterus      There were no vitals filed for this visit.  Visit Diagnosis:  Abnormality of gait  Right leg weakness      Subjective Assessment - 10/18/15 0935    Subjective Pt denied falls or changes since last visit.  Pt did report she was upset this morning because she couldn't hold her baby when he cried, but she reported she's going back to her counselor today to assist with feelings of sadness.   Patient is accompained by: Family member   Pertinent History Seizures, low BP   Patient Stated Goals "Be as close to normal as much as possible and to go back to running" Get back to taking care of my kids, 41 weeks old and 74 and 41 years old   Currently in Pain? No/denies                         [facility] Adult PT Treatment/Exercise - 10/18/15  0936    Ambulation/Gait   Ambulation/Gait Yes   Ambulation/Gait Assistance 5: Supervision;4: Min guard;Other (comment)  Body weight support treadmill training (BWSTT) harness   Ambulation/Gait Assistance Details Cues to improve L step length and stride length and to improve lateral wt. shifting. Pt performed BWSTT 6 minutes x2 with one standing rest break for water, with cues to look straight ahead vs. using vision for foot placement. Pt then amb. 345' with SPC, then amb. 345' with no SPC while holding ball with UEs to decr. R shoulder shrug and R elbow flexion. Pt's speed on BWSTT: 1.63mh.   Ambulation Distance (Feet) --  BWSTT and 345'x2.   Assistive device Straight cane;None;Body weight support system   Gait Pattern Decreased step length - right;Decreased stance time - right;Decreased weight shift to right;Right foot flat;Decreased hip/knee flexion - right   Ambulation Surface Level;Indoor;Other (comment)  BWSTT                  PT Short Term Goals - 08/23/15 1203    PT SHORT TERM GOAL #1   Title Pt will be IND in HEP to improve strength, balance, and endurance. Target date: 07/27/15.   Status Partially Met  PT SHORT TERM GOAL #2   Title Pt will improve gait speed to >/=1.73f/sec to decrease falls risk. Target date: 07/27/15.   Status Achieved   PT SHORT TERM GOAL #3   Title Pt will ambulate 300' with LRAD, over even terrain, at MOD I level to improve functional mobility. Target date: 07/27/15.   Status Achieved   PT SHORT TERM GOAL #4   Title Pt will perform TUG in </=13.5 seconds with LRAD to decr. falls risk. Target date: 07/27/15.   Status Partially Met   PT SHORT TERM GOAL #5   Title Perform BERG and write STG and LTG as appropriate. Target date: 07/27/15.   Status Achieved   PT SHORT TERM GOAL #6   Title Pt will improve BERG to >/=51/56 to decr. falls risk. Target date: 07/27/15.   Status Achieved           PT Long Term Goals - 09/27/15 1209    PT LONG  TERM GOAL #1   Title Pt will ambulate 1000' over even/uneven terrain, with LRAD, at MOD I level to improve functional mobiliity. Target date: 11/12/14.   Baseline deferred to new 4 week POC. All unmet goals will be carried over to new 7 week POC: 11/12/14.   Status On-going   PT LONG TERM GOAL #2   Title Pt will amb. 300' over even terrain without an AD, IND, to safely amb. in home. Target date: 11/12/14.   Status On-going   PT LONG TERM GOAL #3   Title Pt will improve gait speed to >/=2.653fsec, with LRAD, to amb. safely in the community. Target date: 11/12/14.   Status On-going   PT LONG TERM GOAL #4   Title Pt will verbalize plans to join a fitness center upon d/c from PT to continue to maintain strength and endurance gains made during PT. Target date: 11/12/14.   Status On-going   PT LONG TERM GOAL #5   Title Pt improve BERG score to >/=55/56 to decr. falls risk. Target date: 11/12/14.   Status On-going               Plan - 10/18/15 1026    Clinical Impression Statement Pt demonstrated progress, as she was able to amb. with less rest breaks and was able to incr. L step length with cues, while performing BWSTT at increased speed (1.61m24m. Pt would continue to benefit from skilled PT to improve safety during functional mobility.   Pt will benefit from skilled therapeutic intervention in order to improve on the following deficits Abnormal gait;Decreased endurance;Impaired sensation;Decreased knowledge of precautions;Decreased activity tolerance;Decreased knowledge of use of DME;Decreased strength;Impaired UE functional use;Impaired tone;Decreased balance;Decreased mobility;Decreased cognition;Decreased range of motion;Decreased safety awareness;Decreased coordination;Impaired flexibility   Rehab Potential Good   Clinical Impairments Affecting Rehab Potential Seizures which may limit intensity of PT   PT Frequency 2x / week   PT Duration --  7 add'l weeks   PT Treatment/Interventions  ADLs/Self Care Home Management;Neuromuscular re-education;Cognitive remediation;Biofeedback;DME Instruction;Gait training;Stair training;Canalith Repostioning;Patient/family education;Orthotic Fit/Training;Balance training;Therapeutic exercise;Manual techniques;Therapeutic activities;Vestibular   PT Next Visit Plan Core strengthening with physioball .   PT Home Exercise Plan Stretching/strength/balance HEP   Consulted and Agree with Plan of Care Patient        Problem List Patient Active Problem List   Diagnosis Date Noted  . Spastic hemiplegia and hemiparesis affecting dominant side ([place]/10/2014  . Adhesive capsulitis of right shoulder 08/24/2015  . Symptomatic partial epilepsy with simple partial seizures ([place]/05/2015  .  Nontraumatic cortical hemorrhage of cerebral hemisphere ([place])   . Seizure ([place])   . Seizures ([place])   . Adjustment disorder with depressed mood   . Contracture of muscle ankle and foot 06/11/2015  . Hemiplga fol ntrm intcrbl hemor aff right dominant side 06/06/2015  . Left-sided intracerebral hemorrhage ([place]) 06/04/2015  . Seizure disorder as sequela of cerebrovascular accident ([place])   . Seizure disorder ([place]) 06/03/2015  . Sepsis ([place]) 06/03/2015  . UTI (urinary tract infection) 06/03/2015  . Hypotension 06/02/2015  . Right spastic hemiparesis ([place]) 05/28/2015  . 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 History of anxiety disorder 05/28/2015  . ICH (intracerebral hemorrhage) ([place])   . Seizure disorder, nonconvulsive, with status epilepticus ([place])   . Cerebral venous thrombosis of cortical vein   . Cytotoxic cerebral edema ([place])   . IVH (intraventricular hemorrhage) ([place])   . Term pregnancy 05/07/2015  . Spontaneous vaginal delivery 05/07/2015    Jatavius Ellenwood L 10/18/2015, 10:35 AM  [facility] [address] [place], [place], [address] Phone: [phone]    Fax:  [phone]  Name: Ashley [name] MRN: [id number] Date of Birth: 07-12-1976      Geoffry Paradise, PT,DPT 10/18/2015 10:35 AM Phone: [phone] Fax: [phone]

## 2015-10-18 NOTE — Therapy (Signed)
[facility] [address] [place], [place], [address] Phone: [phone]   Fax:  [phone]  Occupational Therapy Treatment  Patient Details  Name: Ashley [name] MRN: [id number] Date of Birth: Apr 14, 1976 No Data Recorded  Encounter Date: 10/18/2015      OT End of Session - 10/18/15 1158    Visit Number 29   Number of Visits 34   Date for OT Re-Evaluation 10/27/15   Authorization Type BCBS   Authorization - Visit Number 2   Authorization - Number of Visits 30   OT Start Time 0847   OT Stop Time 0930   OT Time Calculation (min) 43 min   Activity Tolerance Patient tolerated treatment well      Past Medical History  Diagnosis Date  . Anxiety   . ICH (intracerebral hemorrhage) ([place])   . Stroke [facility])     Past Surgical History  Procedure Laterality Date  . Dilation and curettage of uterus      There were no vitals filed for this visit.  Visit Diagnosis:  Right spastic hemiparesis (HCC)  Decreased coordination  Pain in joint of right shoulder      Subjective Assessment - 10/18/15 0852    Subjective  It is hard for me to relax my arm   Pertinent History see epic snapshot; pt with SAH 2 weeks after deliverying her 3rd child   Patient Stated Goals to be able to use my R arm normally   Currently in Pain? Yes   Pain Score 2    Pain Location Shoulder   Pain Orientation Right   Pain Descriptors / Indicators Sore   Pain Type Chronic pain   Pain Onset More than a month ago   Pain Frequency Intermittent   Aggravating Factors  When i overuse it or don't move it enough   Pain Relieving Factors avoding certain movements                      OT Treatments/Exercises (OP) - 10/18/15 0001    Neurological Re-education Exercises   Other Exercises 1 Neuro re ed to adress trunk movement/allignment/control with emphasis on dissociation of lower trunk from upper trunk, active trunk rotation, A/P and  lateral tilts on ball to encourage abillity to move more fluidily, as well as to separate trunk from R scapula as pt "holds" from pelvis to scapula in attempt to maintain control in sitting and standing.  Also addressed separation of scapulua from humeral head with active trunk movement with progression to bilateral overhead reach in closed chain in stting and then in standing with LLE on block to encourage weight shift and activation of RLE and trunk to assist with overhead reach.  Pt with very poor perceptual awareness of midline which impacts ability to weight bear more symmetrically and fully active trunk for RUE use.                   OT Short Term Goals - 10/18/15 1155    OT SHORT TERM GOAL #1   Title I with HEP.- 07/28/15   Status Achieved   OT SHORT TERM GOAL #2   Title Pt will demonstrate impoved fine motor coordinationas evidenced by decreasing RUE 9 hole peg test score to 35 secs or less.   Baseline 28.44 secs   Status Achieved   OT SHORT TERM GOAL #3   Title Pt will perform all basic ADLs at a supervision level.   Status  Achieved   OT SHORT TERM GOAL #4   Title Pt will use RUE as an active assist for ADLS/IADLS at least 50 % of the time.   Status Achieved   OT SHORT TERM GOAL #5   Title Pt will perform basic home management/ cooking at min A level.   Status Achieved   OT SHORT TERM GOAL #6   Title Pt will perform home management/ cooking at a supervision level consistently.   Baseline due 09/27/15 , pt is performing yet not consistent   Time 4   Period Weeks   Status Partially Met   OT SHORT TERM GOAL #7   Title Pt will demonstrate ability to retrieve a lightweight object at 95* with pain less than or equal to 3/10.   Time 4   Period Weeks   Status On-going           OT Long Term Goals - 10/18/15 1155    OT LONG TERM GOAL #1   Title Pt will perfom all basic ADLS modified independently. due 10/27/15   Baseline min A with washing hair, difficulty with  hygeine    Time 8   Period Weeks   Status On-going   OT LONG TERM GOAL #2   Title Pt will perform basic home management/ cooking at a supervision level demonstrating good safety awareness.- not met (now STG)   Baseline revised long term goal- modified independent with cooking and home management with pt demonstrating good safety awareness   Time 8   Period Weeks   Status On-going   OT LONG TERM GOAL #3   Title Pt will demonstrate ability to retrieve a 3 lbs weight at 120 x 5 reps without dropping with RUE   Baseline goal deferred due to0 shoulder pain   Time 8   Period Weeks   Status Not Met   OT LONG TERM GOAL #4   Title Pt will resume use of RUE as dominant hand at least 75% of the time for ADLs/ IADLs. not met 30% goal revised   Baseline revised goal- Pt will use RUE as an active assist 75% of the time    Time 8   Status On-going   OT LONG TERM GOAL #5   Title Pt will demonstrate ability to perform a physical and cognitive task simultaneously with 80% or better accuracy.   Baseline not fully addressed due to recent shoulder pain-    Time 8   Status Deferred   OT LONG TERM GOAL #6   Title Pt will demonstrate improved fine motor coordination as evidenced by decreasing RUE 9 hole peg test score to 30 secs or less.   Baseline 25.47 secs   Time 8   Status Achieved               Plan - 10/18/15 1156    Clinical Impression Statement Pt progressing skowly toward goals.  Pt making gains in painfree shoulder flexion in both closed chain and open ended reach with RUE.   Pt will benefit from skilled therapeutic intervention in order to improve on the following deficits (Retired) Abnormal gait;Decreased coordination;Decreased range of motion;Difficulty walking;Impaired flexibility;Decreased endurance;Decreased safety awareness;Increased edema;Impaired tone;Decreased knowledge of precautions;Decreased activity tolerance;Decreased balance;Decreased knowledge of use of DME;Pain;Impaired  UE functional use;Impaired vision/preception;Decreased cognition;Decreased mobility;Decreased strength   Rehab Potential Good   OT Frequency 2x / week   OT Duration 8 weeks   OT Treatment/Interventions Self-care/ADL training;Therapeutic exercise;Patient/family education;Balance training;Ultrasound;Neuromuscular education;Manual Therapy;Splinting;Therapeutic exercises;Energy conservation;Parrafin;DME and/or AE  instruction;Therapeutic activities;Cognitive remediation/compensation;Gait Training;Fluidtherapy;Electrical Stimulation;Moist Heat;Contrast Bath;Passive range of motion;Visual/perceptual remediation/compensation   Plan address pain prn, NMR for low to mid to beginning high reach, trunk control, alignment and grading   OT Home Exercise Plan issued: ball ex in supine, trunk for A-p tilts with trunk rotation(07/05/15)   Consulted and Agree with Plan of Care Patient   Family Member Consulted husband        Problem List Patient Active Problem List   Diagnosis Date Noted  . Spastic hemiplegia and hemiparesis affecting dominant side ([place]) 09/13/2015  . Adhesive capsulitis of right shoulder 08/24/2015  . Symptomatic partial epilepsy with simple partial seizures ([place]) 08/21/2015  . Nontraumatic cortical hemorrhage of cerebral hemisphere ([place])   . Seizure ([place])   . Seizures ([place])   . Adjustment disorder with depressed mood   . Contracture of muscle ankle and foot 06/11/2015  . Hemiplga fol ntrm intcrbl hemor aff right dominant side 06/06/2015  . Left-sided intracerebral hemorrhage ([place]) 06/04/2015  . Seizure disorder as sequela of cerebrovascular accident ([place])   . Seizure disorder ([place]) 06/03/2015  . Sepsis ([place]) 06/03/2015  . UTI (urinary tract infection) 06/03/2015  . Hypotension 06/02/2015  . Right spastic hemiparesis ([place]) 05/28/2015  . 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 History of anxiety disorder 05/28/2015  . ICH (intracerebral hemorrhage) ([place])    . Seizure disorder, nonconvulsive, with status epilepticus ([place])   . Cerebral venous thrombosis of cortical vein   . Cytotoxic cerebral edema ([place])   . IVH (intraventricular hemorrhage) ([place])   . Term pregnancy 05/07/2015  . Spontaneous vaginal delivery 05/07/2015    Quay Burow, OTR/L 10/18/2015, 12:00 PM  [facility] [address] [place], [place], [address] Phone: [phone]   Fax:  [phone]  Name: Ashley [name] MRN: [id number] Date of Birth: 04-29-1976

## 2015-10-23 ENCOUNTER — Ambulatory Visit: Payer: BLUE CROSS/BLUE SHIELD

## 2015-10-23 ENCOUNTER — Encounter: Payer: Self-pay | Admitting: Occupational Therapy

## 2015-10-23 ENCOUNTER — Ambulatory Visit: Payer: BLUE CROSS/BLUE SHIELD | Admitting: Occupational Therapy

## 2015-10-23 DIAGNOSIS — R278 Other lack of coordination: Secondary | ICD-10-CM

## 2015-10-23 DIAGNOSIS — R279 Unspecified lack of coordination: Secondary | ICD-10-CM

## 2015-10-23 DIAGNOSIS — G8111 Spastic hemiplegia affecting right dominant side: Secondary | ICD-10-CM

## 2015-10-23 DIAGNOSIS — M25511 Pain in right shoulder: Secondary | ICD-10-CM

## 2015-10-23 DIAGNOSIS — R269 Unspecified abnormalities of gait and mobility: Secondary | ICD-10-CM | POA: Diagnosis not present

## 2015-10-23 DIAGNOSIS — R29898 Other symptoms and signs involving the musculoskeletal system: Secondary | ICD-10-CM

## 2015-10-23 NOTE — Therapy (Signed)
[facility] [address] [place], [place], [address] Phone: [phone]   Fax:  [phone]  Occupational Therapy Treatment  Patient Details  Name: Ashley [name] MRN: [id number] Date of Birth: [birth year] No Data Recorded  Encounter Date: 10/23/2015      OT End of Session - 10/23/15 1312    Visit Number 30   Number of Visits 34   Date for OT Re-Evaluation 10/27/15   Authorization Type BCBS   Authorization Time Period 30 visit COMBINED between PT/OT   Authorization - Visit Number 3   Authorization - Number of Visits 30   OT Start Time 1146   OT Stop Time 1232   OT Time Calculation (min) 46 min   Activity Tolerance Patient tolerated treatment well      Past Medical History  Diagnosis Date  . Anxiety   . ICH (intracerebral hemorrhage) ([place])   . Stroke [facility])     Past Surgical History  Procedure Laterality Date  . Dilation and curettage of uterus      There were no vitals filed for this visit.  Visit Diagnosis:  Right spastic hemiparesis (HCC)  Pain in joint of right shoulder  Coordination impairment      Subjective Assessment - 10/23/15 1304    Subjective  I fell last week - my arm doesn't hurt it just feels tighter.    Pertinent History see epic snapshot; pt with SAH 2 weeks after deliverying her 3rd child   Patient Stated Goals to be able to use my R arm normally   Currently in Pain? No/denies                      OT Treatments/Exercises (OP) - 10/23/15 0001    Neurological Re-education Exercises   Other Exercises 1 Neuro re ed to address trunk control and humeral/scapula realignment/length prior to address overhead bilateral reach.  Address first in supine and then in sitting. By end of session pt able to achieve 135* of shoulder flexion with bliateral reach in closed chain activity with only min facilitation at scapula and no pain. Discussed pt's benefit plan changes and provided  opritons to pt and wife how to best use remaining visits. Pt and husband to discuss and share decision at next visit.                   OT Short Term Goals - 10/23/15 1310    OT SHORT TERM GOAL #1   Title I with HEP.- 07/28/15   Status Achieved   OT SHORT TERM GOAL #2   Title Pt will demonstrate impoved fine motor coordinationas evidenced by decreasing RUE 9 hole peg test score to 35 secs or less.   Baseline 28.44 secs   Status Achieved   OT SHORT TERM GOAL #3   Title Pt will perform all basic ADLs at a supervision level.   Status Achieved   OT SHORT TERM GOAL #4   Title Pt will use RUE as an active assist for ADLS/IADLS at least 50 % of the time.   Status Achieved   OT SHORT TERM GOAL #5   Title Pt will perform basic home management/ cooking at min A level.   Status Achieved   OT SHORT TERM GOAL #6   Title Pt will perform home management/ cooking at a supervision level consistently.   Baseline due 09/27/15 , pt is performing yet not consistent   Time 4   Period  Weeks   Status Partially Met   OT SHORT TERM GOAL #7   Title Pt will demonstrate ability to retrieve a lightweight object at 95* with pain less than or equal to 3/10.   Time 4   Period Weeks   Status On-going           OT Long Term Goals - 10/23/15 1310    OT LONG TERM GOAL #1   Title Pt will perfom all basic ADLS modified independently. due 10/27/15   Baseline min A with washing hair, difficulty with hygeine    Time 8   Period Weeks   Status On-going   OT LONG TERM GOAL #2   Title Pt will perform basic home management/ cooking at a supervision level demonstrating good safety awareness.- not met (now STG)   Baseline revised long term goal- modified independent with cooking and home management with pt demonstrating good safety awareness   Time 8   Period Weeks   Status On-going   OT LONG TERM GOAL #3   Title Pt will demonstrate ability to retrieve a 3 lbs weight at 120 x 5 reps without dropping with  RUE   Baseline goal deferred due to0 shoulder pain   Time 8   Period Weeks   Status Not Met   OT LONG TERM GOAL #4   Title Pt will resume use of RUE as dominant hand at least 75% of the time for ADLs/ IADLs. not met 30% goal revised   Baseline revised goal- Pt will use RUE as an active assist 75% of the time    Time 8   Status On-going   OT LONG TERM GOAL #5   Title Pt will demonstrate ability to perform a physical and cognitive task simultaneously with 80% or better accuracy.   Baseline not fully addressed due to recent shoulder pain-    Time 8   Status Deferred   OT LONG TERM GOAL #6   Title Pt will demonstrate improved fine motor coordination as evidenced by decreasing RUE 9 hole peg test score to 30 secs or less.   Baseline 25.47 secs   Time 8   Status Achieved               Plan - 10/23/15 1311    Clinical Impression Statement Pt progessing toward goals and demonstates improved overhead bilateral reach in closed chain activities.   Pt will benefit from skilled therapeutic intervention in order to improve on the following deficits (Retired) Abnormal gait;Decreased coordination;Decreased range of motion;Difficulty walking;Impaired flexibility;Decreased endurance;Decreased safety awareness;Increased edema;Impaired tone;Decreased knowledge of precautions;Decreased activity tolerance;Decreased balance;Decreased knowledge of use of DME;Pain;Impaired UE functional use;Impaired vision/preception;Decreased cognition;Decreased mobility;Decreased strength   Rehab Potential Good   OT Frequency 2x / week   OT Duration 8 weeks   OT Treatment/Interventions Self-care/ADL training;Therapeutic exercise;Patient/family education;Balance training;Ultrasound;Neuromuscular education;Manual Therapy;Splinting;Therapeutic exercises;Energy conservation;Parrafin;DME and/or AE instruction;Therapeutic activities;Cognitive remediation/compensation;Gait Training;Fluidtherapy;Electrical Stimulation;Moist  Heat;Contrast Bath;Passive range of motion;Visual/perceptual remediation/compensation   Plan address pain prn, NMR for mid to high reach bilaterally as well as unilateral in mid to beginning high reach, alignment and grading   Consulted and Agree with Plan of Care Patient   Family Member Consulted husband        Problem List Patient Active Problem List   Diagnosis Date Noted  . Spastic hemiplegia and hemiparesis affecting dominant side ([place]) 09/13/2015  . Adhesive capsulitis of right shoulder 08/24/2015  . Symptomatic partial epilepsy with simple partial seizures ([place]) 08/21/2015  . Nontraumatic cortical hemorrhage  of cerebral hemisphere ([place])   . Seizure ([place])   . Seizures ([place])   . Adjustment disorder with depressed mood   . Contracture of muscle ankle and foot 06/11/2015  . Hemiplga fol ntrm intcrbl hemor aff right dominant side 06/06/2015  . Left-sided intracerebral hemorrhage ([place]) 06/04/2015  . Seizure disorder as sequela of cerebrovascular accident ([place])   . Seizure disorder ([place]) 06/03/2015  . Sepsis ([place]) 06/03/2015  . UTI (urinary tract infection) 06/03/2015  . Hypotension 06/02/2015  . Right spastic hemiparesis ([place]) 05/28/2015  . 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 History of anxiety disorder 05/28/2015  . ICH (intracerebral hemorrhage) ([place])   . Seizure disorder, nonconvulsive, with status epilepticus ([place])   . Cerebral venous thrombosis of cortical vein   . Cytotoxic cerebral edema ([place])   . IVH (intraventricular hemorrhage) ([place])   . Term pregnancy 05/07/2015  . Spontaneous vaginal delivery 05/07/2015    Quay Burow, OTR/L 10/23/2015, 1:14 PM  [facility] [address] [place], [place], [address] Phone: [phone]   Fax:  [phone]  Name: Ashley [name] MRN: [id number] Date of Birth: 1976/09/20

## 2015-10-23 NOTE — Therapy (Signed)
[employer] [address] [place], [place], [address] Phone: [phone]   Fax:  [phone]  Physical Therapy Treatment  Patient Details  Name: Ashley [name] MRN: [id number] Date of Birth: 1976/01/15 No Data Recorded  Encounter Date: 10/23/2015      PT End of Session - 10/23/15 1151    Visit Number 30   Number of Visits 39   Date for PT Re-Evaluation 11/13/15   Authorization Type BCBS. Per Laverda Page, visits now 30 for PT and OT total (15 per OT and 15 per PT).   Authorization - Visit Number 3   Authorization - Number of Visits 15   PT Start Time 1102   PT Stop Time 1144   PT Time Calculation (min) 42 min   Equipment Utilized During Treatment Gait belt   Activity Tolerance Patient tolerated treatment well   Behavior During Therapy WFL for tasks assessed/performed      Past Medical History  Diagnosis Date  . Anxiety   . ICH (intracerebral hemorrhage) ([place])   . Stroke [facility])     Past Surgical History  Procedure Laterality Date  . Dilation and curettage of uterus      There were no vitals filed for this visit.  Visit Diagnosis:  Right leg weakness  Decreased coordination      Subjective Assessment - 10/23/15 1104    Subjective Pt reported she fell last Thursday night, because she tripped over the elliptical in the bedroom. Pt reported she fell on floor and "jarred head", and has experienced a light HA since the fall. HA pain: 1/10.  Pt stated she has not told MD yet.  Pt reported she saw a counselor last week and feels better emotionally.   Pertinent History Seizures, low BP   Patient Stated Goals "Be as close to normal as much as possible and to go back to running" Get back to taking care of my kids, 54 weeks old and 12 and 71 years old   Currently in Pain? No/denies        Neuro re-ed: Physioball activities to improve coordination, balance, and core strength. -Ant/lat/post pelvic tilts and clock wise  and counter clockwise pelvic circles while seated on physioball. x10 reps/activity. Tactile cues and demonstration for technique. -B LE marches and B LAQs x10/activity. Cues for technique and min A during 2 LOB episodes. Otherwise, min guard to S for safety.  Therex: -Supine bridges with LEs on physioball 3x10.  -Supine lower ab crunches and HS curls with LEs on physioball 3x10. -HS curls in supine with LEs on phyisoball and pt performing isometric hip ext while performing HS curl to incr. Resistance and difficulty. 3x10. Cues for technique and occasional assist R LE placement on ball.                   Self Care:     PT Education - 10/23/15 1149    Education provided Yes   Education Details PT discussed pt's options, as her [employer] PT/OT visits number are reduced this year. OT will provide pt with print out of options for pt and her husband to discuss. PT also reiterated the importance of performing HEP to make progress. PT also educated pt on the importance of notifying MD that she fell and has experienced concussion-like symptoms since fall. Pt reported she met with a friend who is a PT at home, and might utilize her if insurance benefits run out.   Person(s) Educated Patient  Methods Explanation   Comprehension Verbalized understanding          PT Short Term Goals - 08/23/15 1203    PT SHORT TERM GOAL #1   Title Pt will be IND in HEP to improve strength, balance, and endurance. Target date: 07/27/15.   Status Partially Met   PT SHORT TERM GOAL #2   Title Pt will improve gait speed to >/=1.34f/sec to decrease falls risk. Target date: 07/27/15.   Status Achieved   PT SHORT TERM GOAL #3   Title Pt will ambulate 300' with LRAD, over even terrain, at MOD I level to improve functional mobility. Target date: 07/27/15.   Status Achieved   PT SHORT TERM GOAL #4   Title Pt will perform TUG in </=13.5 seconds with LRAD to decr. falls risk. Target date: 07/27/15.    Status Partially Met   PT SHORT TERM GOAL #5   Title Perform BERG and write STG and LTG as appropriate. Target date: 07/27/15.   Status Achieved   PT SHORT TERM GOAL #6   Title Pt will improve BERG to >/=51/56 to decr. falls risk. Target date: 07/27/15.   Status Achieved           PT Long Term Goals - 09/27/15 1209    PT LONG TERM GOAL #1   Title Pt will ambulate 1000' over even/uneven terrain, with LRAD, at MOD I level to improve functional mobiliity. Target date: 11/12/14.   Baseline deferred to new 4 week POC. All unmet goals will be carried over to new 7 week POC: 11/12/14.   Status On-going   PT LONG TERM GOAL #2   Title Pt will amb. 300' over even terrain without an AD, IND, to safely amb. in home. Target date: 11/12/14.   Status On-going   PT LONG TERM GOAL #3   Title Pt will improve gait speed to >/=2.646fsec, with LRAD, to amb. safely in the community. Target date: 11/12/14.   Status On-going   PT LONG TERM GOAL #4   Title Pt will verbalize plans to join a fitness center upon d/c from PT to continue to maintain strength and endurance gains made during PT. Target date: 11/12/14.   Status On-going   PT LONG TERM GOAL #5   Title Pt improve BERG score to >/=55/56 to decr. falls risk. Target date: 11/12/14.   Status On-going               Plan - 10/23/15 1153    Clinical Impression Statement Pt demonstrated progress, as she tolerated neuro re-ed and therex well. Pt initally required cues to decr. R elbow flexion synergy pattern during balance and therex training, but progressed to stopping synergy pattern IND. Pt would continue to benefit from skilled PT to improve functional mobilty.  Pt's insurance plan has decreased her visit this year, therefore, pt and pt's husband will decide on what option works best for them based on the options PT and OT has provided.   Pt will benefit from skilled therapeutic intervention in order to improve on the following deficits Abnormal  gait;Decreased endurance;Impaired sensation;Decreased knowledge of precautions;Decreased activity tolerance;Decreased knowledge of use of DME;Decreased strength;Impaired UE functional use;Impaired tone;Decreased balance;Decreased mobility;Decreased cognition;Decreased range of motion;Decreased safety awareness;Decreased coordination;Impaired flexibility   Rehab Potential Good   Clinical Impairments Affecting Rehab Potential Seizures which may limit intensity of PT   PT Frequency 2x / week   PT Duration Other (comment)  add'l 7 weeks   PT Treatment/Interventions ADLs/Self Care  Home Management;Neuromuscular re-education;Cognitive remediation;Biofeedback;DME Instruction;Gait training;Stair training;Canalith Repostioning;Patient/family education;Orthotic Fit/Training;Balance training;Therapeutic exercise;Manual techniques;Therapeutic activities;Vestibular   PT Next Visit Plan BWSTT with overgound gait training.   PT Home Exercise Plan Stretching/strength/balance HEP   Consulted and Agree with Plan of Care Patient        Problem List Patient Active Problem List   Diagnosis Date Noted  . Spastic hemiplegia and hemiparesis affecting dominant side ([place]) 09/13/2015  . Adhesive capsulitis of right shoulder 08/24/2015  . Symptomatic partial epilepsy with simple partial seizures ([place]) 08/21/2015  . Nontraumatic cortical hemorrhage of cerebral hemisphere ([place])   . Seizure ([place])   . Seizures ([place])   . Adjustment disorder with depressed mood   . Contracture of muscle ankle and foot 06/11/2015  . Hemiplga fol ntrm intcrbl hemor aff right dominant side 06/06/2015  . Left-sided intracerebral hemorrhage ([place]) 06/04/2015  . Seizure disorder as sequela of cerebrovascular accident ([place])   . Seizure disorder ([place]) 06/03/2015  . Sepsis ([place]) 06/03/2015  . UTI (urinary tract infection) 06/03/2015  . Hypotension 06/02/2015  . Right spastic hemiparesis ([place]) 05/28/2015  . Aphasia following nontraumatic  intracerebral hemorrhage 05/28/2015  . History of anxiety disorder 05/28/2015  . ICH (intracerebral hemorrhage) ([place])   . Seizure disorder, nonconvulsive, with status epilepticus ([place])   . Cerebral venous thrombosis of cortical vein   . Cytotoxic cerebral edema ([place])   . IVH (intraventricular hemorrhage) ([place])   . Term pregnancy 05/07/2015  . Spontaneous vaginal delivery 05/07/2015    Miller,Jennifer L 10/23/2015, 11:59 AM  [facility] [address] [place], [place], [address] Phone: [phone]   Fax:  [phone]  Name: Saori Umholtz MRN: [id number] Date of Birth: [birth year]   Geoffry Paradise, PT,DPT 10/23/2015 11:59 AM Phone: [phone] Fax: [phone]

## 2015-10-25 ENCOUNTER — Encounter: Payer: BLUE CROSS/BLUE SHIELD | Attending: Physical Medicine & Rehabilitation

## 2015-10-25 ENCOUNTER — Ambulatory Visit (HOSPITAL_BASED_OUTPATIENT_CLINIC_OR_DEPARTMENT_OTHER): Payer: BLUE CROSS/BLUE SHIELD | Admitting: Physical Medicine & Rehabilitation

## 2015-10-25 ENCOUNTER — Other Ambulatory Visit: Payer: Self-pay | Admitting: *Deleted

## 2015-10-25 ENCOUNTER — Encounter: Payer: Self-pay | Admitting: Physical Medicine & Rehabilitation

## 2015-10-25 VITALS — BP 94/47 | HR 81 | Resp 12

## 2015-10-25 DIAGNOSIS — I69151 Hemiplegia and hemiparesis following nontraumatic intracerebral hemorrhage affecting right dominant side: Secondary | ICD-10-CM | POA: Diagnosis not present

## 2015-10-25 DIAGNOSIS — M7501 Adhesive capsulitis of right shoulder: Secondary | ICD-10-CM

## 2015-10-25 DIAGNOSIS — G8111 Spastic hemiplegia affecting right dominant side: Secondary | ICD-10-CM | POA: Diagnosis not present

## 2015-10-25 DIAGNOSIS — I69120 Aphasia following nontraumatic intracerebral hemorrhage: Secondary | ICD-10-CM | POA: Insufficient documentation

## 2015-10-25 DIAGNOSIS — F419 Anxiety disorder, unspecified: Secondary | ICD-10-CM | POA: Insufficient documentation

## 2015-10-25 DIAGNOSIS — Z79899 Other long term (current) drug therapy: Secondary | ICD-10-CM | POA: Diagnosis not present

## 2015-10-25 MED ORDER — TOPIRAMATE 50 MG PO TABS: 50.0000 mg | ORAL_TABLET | Freq: Two times a day (BID) | ORAL | Status: DC

## 2015-10-25 NOTE — Patient Instructions (Signed)
Will increase Botox dose next visit  Please call Dr. Leonie Man if you have increasing headaches, nausea, increased balance problems or weakness. I do not see an indication currently for a repeat CT scan.

## 2015-10-25 NOTE — Progress Notes (Signed)
Subjective:    Patient ID: Ashley [name], female    DOB: [birth year], 40 y.o.   MRN: [id number] Dilution: 50 Units/ml Indication: Severe spasticity which interferes with ADL,mobility and/or hygiene and is unresponsive to medication management and other conservative care Informed consent was obtained after describing risks and benefits of the procedure with the patient. This includes bleeding, bruising, infection, excessive weakness, or medication side effects. A REMS form is on file and signed. Needle: 25g 1" needle electrode Number of units per muscle Gastrosoleus 25 units medial and 25 units lateral Soleus 25 units 3 Posterior tibialis 25 units 2 Flexor digitorum longus 25 units 1 HPI Patient has noted some improvements with her walking since the Botox injection. She did have a fall a couple days ago tripped on something at home. Is not using her cane on a consistent basis except outside the house.  Still receiving outpatient PT and OT. Reviewed PT and OT notes. OT is working on overhead reaching PT is working on gait Needing reminders to use right upper extremity for functional tasks.  SHoe insert on left side AFO on right  Pain Inventory Average Pain 2 Pain Right Now 2 My pain is intermittent and dull  In the last 24 hours, has pain interfered with the following? General activity 5 Relation with others 5 Enjoyment of life 5 What TIME of day is your pain at its worst? morning, daytime, evening, night Sleep (in general) Fair  Pain is worse with: some activites Pain improves with: therapy/exercise, medication and injections Relief from Meds: 7  Mobility walk with assistance use a cane how many minutes can you walk? 60 ability to climb steps?  yes do you drive?  no Do you have any goals in this area?  yes  Function not employed: date last employed 01/2015 disabled: date disabled 05/2015 I need assistance with the following:  dressing, bathing, meal prep, household  duties and shopping Do you have any goals in this area?  yes  Neuro/Psych weakness numbness tingling trouble walking confusion depression anxiety suicidal thoughts-no active plans  Prior Studies Any changes since last visit?  yes CT/MRI  Physicians involved in your care Any changes since last visit?  yes Primary care . Neurologist .   Family History  Problem Relation Age of Onset  . Cancer Mother   . Cancer Father     pancreatic   Social History   Social History  . Marital Status: Married    Spouse Name: N/A  . Number of Children: N/A  . Years of Education: N/A   Social History Main Topics  . Smoking status: Never Smoker   . Smokeless tobacco: Never Used  . Alcohol Use: 0.6 oz/week    1 Glasses of wine per week     Comment: casual   . Drug Use: No  . Sexual Activity: Not Asked   Other Topics Concern  . None   Social History Narrative   Past Surgical History  Procedure Laterality Date  . Dilation and curettage of uterus     Past Medical History  Diagnosis Date  . Anxiety   . ICH (intracerebral hemorrhage) ([place])   . Stroke ([place])    BP 94/47 mmHg  Pulse 81  Resp 12  SpO2 98%  LMP 10/15/2015  Opioid Risk Score:   Fall Risk Score:  `1  Depression screen PHQ 2/9  Depression screen PHQ 2/9 07/16/2015  Decreased Interest 1  Down, Depressed, Hopeless 1  PHQ - 2 Score 2  Altered sleeping 0  Tired, decreased energy 1  Change in appetite 1  Feeling bad or failure about yourself  1  Trouble concentrating 1  Moving slowly or fidgety/restless 1  Suicidal thoughts 1  PHQ-9 Score 8  Difficult doing work/chores Somewhat difficult     Review of Systems  Constitutional: Positive for appetite change.  Musculoskeletal: Positive for gait problem.  Neurological: Positive for weakness and numbness.       Tingling  Psychiatric/Behavioral: Positive for suicidal ideas, confusion and dysphoric mood. The patient is nervous/anxious.   All other systems  reviewed and are negative.      Objective:   Physical Exam  Constitutional: She is oriented to person, place, and time. She appears well-developed and well-nourished.  HENT:  Head: Normocephalic and atraumatic.  Eyes: Conjunctivae and EOM are normal. Pupils are equal, round, and reactive to light.  Neurological: She is alert and oriented to person, place, and time.  Psychiatric: She has a normal mood and affect.  Nursing note and vitals reviewed.  Patient ambulates without evidence of toe drag she does have the right AFO. On turns she loses her balance and in fact needed at least moderate assistance to regain her balance. This was without a cane.       Assessment & Plan:  1. Left MCA distribution infarct with right hemiparesis. Also has a facial which has improved to a great degree. Has residual spasticity in the right lower limb greater than right upper limb. Has some right sided neglect We discussed her balance and need for straight cane at all times.  We also discussed her right upper extremity neglect. For instance she was using her left hand exclusively to don and doff her AFO before I reminded her and then she was actually able to use the right upper extremity quite nicely to help with donning and doffing.  We discussed her  Fall with headaches. There was no loss of consciousness no evidence of head trauma. She's had some headaches but no other symptoms such as nausea or progressively intense headaches. I do not think a CT scan of the head is indicated at the current time.  Patient will follow-up with neurology  I will see the patient back in about 6 weeks for repeat Botox injection, will increase dosing To 300 units  Gastrosoleus 50 units medial and 50 units lateral Soleus 25 units 3 Posterior tibialis 25 units 3 Flexor digitorum longus 25 units 2  Answered list the patient questions. Mother in room with her. Over half of the 25 min visit was spent counseling and  coordinating care.

## 2015-10-26 ENCOUNTER — Ambulatory Visit: Payer: BLUE CROSS/BLUE SHIELD

## 2015-10-26 ENCOUNTER — Ambulatory Visit: Payer: BLUE CROSS/BLUE SHIELD | Admitting: Occupational Therapy

## 2015-10-26 DIAGNOSIS — G8111 Spastic hemiplegia affecting right dominant side: Secondary | ICD-10-CM

## 2015-10-26 DIAGNOSIS — R269 Unspecified abnormalities of gait and mobility: Secondary | ICD-10-CM

## 2015-10-26 DIAGNOSIS — M25511 Pain in right shoulder: Secondary | ICD-10-CM

## 2015-10-26 DIAGNOSIS — R252 Cramp and spasm: Secondary | ICD-10-CM

## 2015-10-26 DIAGNOSIS — R278 Other lack of coordination: Secondary | ICD-10-CM

## 2015-10-26 DIAGNOSIS — IMO0002 Reserved for concepts with insufficient information to code with codable children: Secondary | ICD-10-CM

## 2015-10-26 DIAGNOSIS — R279 Unspecified lack of coordination: Secondary | ICD-10-CM

## 2015-10-26 NOTE — Therapy (Signed)
[facility] [address] [place], [place], [address] Phone: [phone]   Fax:  [phone]  Occupational Therapy Treatment  Patient Details  Name: Ashley [name] MRN: [id number] Date of Birth: [birth year] No Data Recorded  Encounter Date: 10/26/2015      OT End of Session - 10/26/15 1738    Visit Number 31   Authorization Type BCBS   Authorization Time Period 30 visit COMBINED between PT/OT   Authorization - Visit Number 4   Authorization - Number of Visits 15  30 visits combined for OT/PT   OT Start Time 1318   OT Stop Time 1400   OT Time Calculation (min) 42 min   Activity Tolerance Patient tolerated treatment well   Behavior During Therapy [facility] for tasks assessed/performed      Past Medical History  Diagnosis Date  . Anxiety   . ICH (intracerebral hemorrhage) ([place])   . Stroke [facility])     Past Surgical History  Procedure Laterality Date  . Dilation and curettage of uterus      There were no vitals filed for this visit.  Visit Diagnosis:  Right spastic hemiparesis ([place]) - Plan: Ot plan of care cert/re-cert  Pain in joint of right shoulder - Plan: Ot plan of care cert/re-cert  Coordination impairment - Plan: Ot plan of care cert/re-cert  Decreased coordination - Plan: Ot plan of care cert/re-cert  Weakness due to cerebrovascular accident - Plan: Ot plan of care cert/re-cert  Spasticity - Plan: Ot plan of care cert/re-cert      Subjective Assessment - 10/26/15 1359    Pertinent History see epic snapshot; pt with SAH 2 weeks after deliverying her 3rd child   Patient Stated Goals to be able to use my R arm normally   Currently in Pain? Yes   Pain Score 3    Pain Location Shoulder   Pain Orientation Right   Pain Descriptors / Indicators Sore   Pain Type Chronic pain   Pain Onset More than a month ago   Pain Frequency Intermittent   Aggravating Factors  malpositioning   Pain Relieving Factors  repositioning   Effect of Pain on Daily Activities limits use   Multiple Pain Sites No        Treatment: seated scapular mobs and weightbearing through RUE with lateral weight shifts and trunk rotation, mod facilitation. Closed chain shoulder flexion with PVC pipe frame, mid level with min-mod facilitation.  Open chain reaching mid level with RUE, min v.c. Facilitation. Therapist checked progress towards goals, with plans to renew., pt would like to continue 2x week at this time.                        OT Short Term Goals - 10/26/15 1337    OT SHORT TERM GOAL #1   Title I with HEP.- 07/28/15   Status Achieved   OT SHORT TERM GOAL #2   Title Pt will demonstrate improved fine motor coordinationas evidenced by decreasing RUE 9 hole peg test score to 35 secs or less.   Baseline 28.44 secs   Status Achieved   OT SHORT TERM GOAL #3   Title Pt will perform all basic ADLs at a supervision level.   Status Achieved   OT SHORT TERM GOAL #4   Title Pt will use RUE as an active assist for ADLS/IADLS at least 50 % of the time.   Status Achieved   OT SHORT TERM  GOAL #5   Title Pt will perform basic home management/ cooking at min A level.   Status Achieved   Additional Short Term Goals   Additional Short Term Goals Yes   OT SHORT TERM GOAL #6   Title Pt will perform home management/ cooking at a supervision level consistently.   Baseline met per pt report   Time --   Period Weeks   Status Achieved   OT SHORT TERM GOAL #7   Title Pt will demonstrate ability to retrieve a lightweight object at 95* with pain less than or equal to 3/10.   Baseline with min compensations and no pain   Time --   Period Weeks   Status Achieved   OT SHORT TERM GOAL #8   Title Pt will retrieve a lightweight object at 100 shoulder flexion with min compensations and pain no greater than 3/10.   Time 6   Period Weeks   Status New   OT SHORT TERM GOAL  #9   TITLE Pt will use RUE as an active  assist for ADLs/ IADLS 70 % of the time.   Time 6   Period Weeks   Status New   OT SHORT TERM GOAL  #10   TITLE Pt will report that she is able to fasten her bra and perform toileting hygiene with greater ease.   Time 6   Period Weeks   Status New           OT Long Term Goals - 10/26/15 1349    OT LONG TERM GOAL #1   Title Pt will perfom all basic ADLS modified independently. due 10/27/15- see updated short term goal   Baseline  difficulty with hygeine for toileting and fastening bra   Time --   Period Weeks   Status Partially Met   OT LONG TERM GOAL #2   Title revised long term goal- modified independent with cooking and home management with pt demonstrating good safety awareness   Time 12   Period Weeks   Status On-going   OT LONG TERM GOAL #3   Title Pt will demonstrate ability to retrieve a 3 lbs weight at 120 x 5 reps without dropping with RUE   Baseline goal deferred due to shoulder pain   Time --   Period Weeks   Status Deferred   OT LONG TERM GOAL #4   Title revised goal- Pt will use RUE as an active assist 75% of the time - see updated short and long term goals   Baseline pt reports using 60% of the time as active assist   Time 12   Status Revised   OT LONG TERM GOAL #5   Title Pt will demonstrate ability to perform a physical and cognitive task simultaneously with 80% or better accuracy.   Baseline not fully addressed due to recent shoulder pain-    Time --   Status Deferred   Long Term Additional Goals   Additional Long Term Goals Yes   OT LONG TERM GOAL #6   Title Pt will demonstrate improved fine motor coordination as evidenced by decreasing RUE 9 hole peg test score to 30 secs or less.   Baseline 25.47 secs   Time --   Status Achieved   OT LONG TERM GOAL #7   Title Pt will report using RUE as an active assist 80% of the time for ADLs/ IADLS.   Time 12   Period Weeks   Status New  OT LONG TERM GOAL #8   Title Pt will retrieve a light weight object  at 110  shoulder flexion with only min compensations.   Time 12   Period Weeks   Status New   OT LONG TERM GOAL  #9   Baseline I with updated HEP.   Time 12   Period Weeks   Status New               Plan - 10/26/15 1721    Clinical Impression Statement Ptdemonstrates overall progress towards goals. Pt did not fully achieve all long term goals due to spascitiy and RUE shoulder pain. Pt's shoulder pain is now greatly improved. Pt can benefit from skilled occupational therapy to address RUE weakness, pain, sensory impairment, cognitive impairment and decreased blalance in order to maximize indpendence with ADLs/ IADLs.   Pt will benefit from skilled therapeutic intervention in order to improve on the following deficits (Retired) Abnormal gait;Decreased coordination;Decreased range of motion;Difficulty walking;Impaired flexibility;Decreased endurance;Decreased safety awareness;Increased edema;Impaired tone;Decreased knowledge of precautions;Decreased activity tolerance;Decreased balance;Decreased knowledge of use of DME;Pain;Impaired UE functional use;Impaired vision/preception;Decreased cognition;Decreased mobility;Decreased strength   Rehab Potential Good   OT Frequency --  15 visits   OT Duration 12 weeks   OT Treatment/Interventions Self-care/ADL training;Therapeutic exercise;Patient/family education;Balance training;Ultrasound;Neuromuscular education;Manual Therapy;Splinting;Therapeutic exercises;Energy conservation;Parrafin;DME and/or AE instruction;Therapeutic activities;Cognitive remediation/compensation;Gait Training;Fluidtherapy;Electrical Stimulation;Moist Heat;Contrast Bath;Passive range of motion;Visual/perceptual remediation/compensation   Plan address pain, NMR for mid high reach    OT Home Exercise Plan issued: ball ex in supine, trunk for A-p tilts with trunk rotation(07/05/15)   Consulted and Agree with Plan of Care Patient        Problem List Patient Active Problem  List   Diagnosis Date Noted  . Spastic hemiplegia and hemiparesis affecting dominant side ([place]) 09/13/2015  . Adhesive capsulitis of right shoulder 08/24/2015  . Symptomatic partial epilepsy with simple partial seizures ([place]) 08/21/2015  . Nontraumatic cortical hemorrhage of cerebral hemisphere ([place])   . Seizure ([place])   . Seizures ([place])   . Adjustment disorder with depressed mood   . Contracture of muscle ankle and foot 06/11/2015  . Hemiplga fol ntrm intcrbl hemor aff right dominant side 06/06/2015  . Left-sided intracerebral hemorrhage ([place]) 06/04/2015  . Seizure disorder as sequela of cerebrovascular accident ([place])   . Seizure disorder ([place]) 06/03/2015  . Sepsis ([name]) 06/03/2015  . UTI (urinary tract infection) 06/03/2015  . Hypotension 06/02/2015  . Right spastic hemiparesis ([place]) 05/28/2015  . 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 History of anxiety disorder 05/28/2015  . ICH (intracerebral hemorrhage) ([place])   . Seizure disorder, nonconvulsive, with status epilepticus ([place])   . Cerebral venous thrombosis of cortical vein   . Cytotoxic cerebral edema ([place])   . IVH (intraventricular hemorrhage) ([place])   . Term pregnancy 05/07/2015  . Spontaneous vaginal delivery 05/07/2015    Patrina Andreas 10/26/2015, 5:42 PM Theone Murdoch, OTR/L [phone] Phone: [phone] 5:43 PM 10/26/2015 [facility] [address] [place], [place], [address] Phone: [phone]   Fax:  [phone]  Name: Ashley [name] MRN: [id number] Date of Birth: 25-Dec-1975

## 2015-10-27 NOTE — Therapy (Signed)
[facility] [address] [place], [place], [address] Phone: [phone]   Fax:  [phone]  Physical Therapy Treatment  Patient Details  Name: Ashley [name] MRN: [id number] Date of Birth: [birth year] No Data Recorded  Encounter Date: 10/26/2015      PT End of Session - 10/27/15 1821    Visit Number 31   Number of Visits 39   Date for PT Re-Evaluation 11/13/15   Authorization Type BCBS. Per Conan Bowens, visits now 30 for PT and OT total (15 per OT and 15 per PT).   Authorization - Visit Number 4   Authorization - Number of Visits 15   PT Start Time 1402   PT Stop Time 1446   PT Time Calculation (min) 44 min   Equipment Utilized During Treatment Gait belt   Activity Tolerance Patient tolerated treatment well   Behavior During Therapy WFL for tasks assessed/performed      Past Medical History  Diagnosis Date  . Anxiety   . ICH (intracerebral hemorrhage) (HCC)   . Stroke [facility])     Past Surgical History  Procedure Laterality Date  . Dilation and curettage of uterus      There were no vitals filed for this visit.  Visit Diagnosis:  Abnormality of gait      Subjective Assessment - 10/26/15 1418    Subjective Pt reported she saw MD on Thursday and told MD about fall and he stated since pt's HA pain was getting better, the fall was not a concern. Pt's private PT (home) requested pt to amb. with L heel wedge to improve functional leg length discrepency.    Pertinent History Seizures, low BP   Patient Stated Goals "Be as close to normal as much as possible and to go back to running" Get back to taking care of my kids, 29 weeks old and 41 and 41 years old   Currently in Pain? No/denies                         [facility] Adult PT Treatment/Exercise - 10/27/15 1815    Ambulation/Gait   Ambulation/Gait Yes   Ambulation/Gait Assistance 5: Supervision;4: Min guard   Ambulation/Gait Assistance Details Cues  to improve wt. shifting onto R LE and to improve L heel strike and step length. Pt amb. with 1/3" inch heel wedge in R and L shoe, as she recently went to [facility] and they fitted pt for another wedge, due to functional leg length discrepency. However, pt was noted to experience incr. gait deviations. Pt also amb. with 1/3" heel wedge in R shoe only and L shoe only. Pt required rest breaks 2/2 fatigue.   Ambulation Distance (Feet) --  230' B heel wedges, 230' no wedge, 230' with R wedge only and 230' with L wedge only.   Assistive device Straight cane   Gait Pattern Decreased step length - right;Decreased stance time - right;Decreased weight shift to right;Right foot flat;Decreased hip/knee flexion - right   Ambulation Surface Level;Indoor           Self Care:     PT Education - 10/27/15 1819    Education provided Yes   Education Details PT discussed that wearing B heel wedges defeats the purpose of correctly leg length discrepency (functional), PT educated pt to trial wearing L wedge only as pt's R genu recurvatum during stance is no longer occurring due to improved strength. PT also  discussed insurance and potential renewal at end of month depending on goal progress. PT set up a time for insurance coordinator to meet with pt and her husband to discuss insuance next week.    Person(s) Educated Patient   Methods Explanation   Comprehension Verbalized understanding          PT Short Term Goals - 08/23/15 1203    PT SHORT TERM GOAL #1   Title Pt will be IND in HEP to improve strength, balance, and endurance. Target date: 07/27/15.   Status Partially Met   PT SHORT TERM GOAL #2   Title Pt will improve gait speed to >/=1.3f/sec to decrease falls risk. Target date: 07/27/15.   Status Achieved   PT SHORT TERM GOAL #3   Title Pt will ambulate 300' with LRAD, over even terrain, at MOD I level to improve functional mobility. Target date: 07/27/15.   Status Achieved   PT SHORT TERM  GOAL #4   Title Pt will perform TUG in </=13.5 seconds with LRAD to decr. falls risk. Target date: 07/27/15.   Status Partially Met   PT SHORT TERM GOAL #5   Title Perform BERG and write STG and LTG as appropriate. Target date: 07/27/15.   Status Achieved   PT SHORT TERM GOAL #6   Title Pt will improve BERG to >/=51/56 to decr. falls risk. Target date: 07/27/15.   Status Achieved           PT Long Term Goals - 09/27/15 1209    PT LONG TERM GOAL #1   Title Pt will ambulate 1000' over even/uneven terrain, with LRAD, at MOD I level to improve functional mobiliity. Target date: 11/12/14.   Baseline deferred to new 4 week POC. All unmet goals will be carried over to new 7 week POC: 11/12/14.   Status On-going   PT LONG TERM GOAL #2   Title Pt will amb. 300' over even terrain without an AD, IND, to safely amb. in home. Target date: 11/12/14.   Status On-going   PT LONG TERM GOAL #3   Title Pt will improve gait speed to >/=2.652fsec, with LRAD, to amb. safely in the community. Target date: 11/12/14.   Status On-going   PT LONG TERM GOAL #4   Title Pt will verbalize plans to join a fitness center upon d/c from PT to continue to maintain strength and endurance gains made during PT. Target date: 11/12/14.   Status On-going   PT LONG TERM GOAL #5   Title Pt improve BERG score to >/=55/56 to decr. falls risk. Target date: 11/12/14.   Status On-going               Plan - 10/27/15 1821    Clinical Impression Statement Pt demonstrated progress as she was able to amb. without R heel wedge donned and not experience R genu recurvatum during R stance phase. Pt continues to experience functional leg length discrepency (R ASIS approx. 1/4" elevated higher than L ASIS) due to R lat. trunk flexion. PT asked pt to trial L heel wedge donned only, but to place R wedge back in shoe if she experiences any R knee pain, and to remove L heel wedge. Pt noted to amb. with less deviations with L heel wedge  placed and no R heel wedge.    Pt will benefit from skilled therapeutic intervention in order to improve on the following deficits Abnormal gait;Decreased endurance;Impaired sensation;Decreased knowledge of precautions;Decreased activity tolerance;Decreased knowledge of use of  DME;Decreased strength;Impaired UE functional use;Impaired tone;Decreased balance;Decreased mobility;Decreased cognition;Decreased range of motion;Decreased safety awareness;Decreased coordination;Impaired flexibility   Rehab Potential Good   Clinical Impairments Affecting Rehab Potential Seizures which may limit intensity of PT   PT Frequency 2x / week   PT Duration Other (comment)   PT Treatment/Interventions ADLs/Self Care Home Management;Neuromuscular re-education;Cognitive remediation;Biofeedback;DME Instruction;Gait training;Stair training;Canalith Repostioning;Patient/family education;Orthotic Fit/Training;Balance training;Therapeutic exercise;Manual techniques;Therapeutic activities;Vestibular   PT Next Visit Plan BWSTT with overgound gait training. Reasses gait with L heel wedge.   PT Home Exercise Plan Stretching/strength/balance HEP   Consulted and Agree with Plan of Care Patient        Problem List Patient Active Problem List   Diagnosis Date Noted  . Spastic hemiplegia and hemiparesis affecting dominant side ([place]) 09/13/2015  . Adhesive capsulitis of right shoulder 08/24/2015  . Symptomatic partial epilepsy with simple partial seizures ([place]) 08/21/2015  . Nontraumatic cortical hemorrhage of cerebral hemisphere ([place])   . Seizure ([place])   . Seizures ([place])   . Adjustment disorder with depressed mood   . Contracture of muscle ankle and foot 06/11/2015  . Hemiplga fol ntrm intcrbl hemor aff right dominant side 06/06/2015  . Left-sided intracerebral hemorrhage ([place]) 06/04/2015  . Seizure disorder as sequela of cerebrovascular accident ([place])   . Seizure disorder ([place]) 06/03/2015  . Sepsis ([place]) 06/03/2015  .  UTI (urinary tract infection) 06/03/2015  . Hypotension 06/02/2015  . Right spastic hemiparesis ([place]) 05/28/2015  . 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 History of anxiety disorder 05/28/2015  . ICH (intracerebral hemorrhage) ([place])   . Seizure disorder, nonconvulsive, with status epilepticus ([place])   . Cerebral venous thrombosis of cortical vein   . Cytotoxic cerebral edema ([place])   . IVH (intraventricular hemorrhage) ([place])   . Term pregnancy 05/07/2015  . Spontaneous vaginal delivery 05/07/2015    Nathanie Ottley L 10/27/2015, 6:25 PM  [facility] [address] [place], [place], [address] Phone: [phone]   Fax:  [phone]  Name: Greg Eckrich MRN: [id number] Date of Birth: [birth year]    Geoffry Paradise, PT,DPT 10/27/2015 6:25 PM Phone: [phone] Fax: [phone]

## 2015-10-30 ENCOUNTER — Ambulatory Visit: Payer: BLUE CROSS/BLUE SHIELD

## 2015-10-30 ENCOUNTER — Encounter: Payer: BLUE CROSS/BLUE SHIELD | Admitting: Occupational Therapy

## 2015-11-01 ENCOUNTER — Ambulatory Visit: Payer: BLUE CROSS/BLUE SHIELD | Admitting: Occupational Therapy

## 2015-11-01 ENCOUNTER — Encounter: Payer: Self-pay | Admitting: Occupational Therapy

## 2015-11-01 ENCOUNTER — Ambulatory Visit: Payer: BLUE CROSS/BLUE SHIELD

## 2015-11-01 DIAGNOSIS — R269 Unspecified abnormalities of gait and mobility: Secondary | ICD-10-CM

## 2015-11-01 DIAGNOSIS — R278 Other lack of coordination: Secondary | ICD-10-CM

## 2015-11-01 DIAGNOSIS — M25511 Pain in right shoulder: Secondary | ICD-10-CM

## 2015-11-01 DIAGNOSIS — IMO0002 Reserved for concepts with insufficient information to code with codable children: Secondary | ICD-10-CM

## 2015-11-01 DIAGNOSIS — G8111 Spastic hemiplegia affecting right dominant side: Secondary | ICD-10-CM

## 2015-11-01 DIAGNOSIS — R279 Unspecified lack of coordination: Secondary | ICD-10-CM

## 2015-11-01 DIAGNOSIS — R29898 Other symptoms and signs involving the musculoskeletal system: Secondary | ICD-10-CM

## 2015-11-01 NOTE — Therapy (Signed)
[facility] [address] [place], [place], [address] Phone: [phone]   Fax:  [phone]  Physical Therapy Treatment  Patient Details  Name: Ashley [name] MRN: [id number] Date of Birth: [birth year] No Data Recorded  Encounter Date: 11/01/2015      PT End of Session - 11/01/15 2105    Visit Number 32   Number of Visits 39   Date for PT Re-Evaluation 11/13/15   Authorization Type BCBS. Per Laverda Page, visits now 30 for PT and OT total (15 per OT and 15 per PT).   Authorization - Visit Number 5   Authorization - Number of Visits 15   PT Start Time 6644   PT Stop Time 1400   PT Time Calculation (min) 45 min   Equipment Utilized During Treatment Gait belt  and BWSTT harness   Activity Tolerance Patient tolerated treatment well   Behavior During Therapy WFL for tasks assessed/performed      Past Medical History  Diagnosis Date  . Anxiety   . ICH (intracerebral hemorrhage) ([place])   . Stroke [facility])     Past Surgical History  Procedure Laterality Date  . Dilation and curettage of uterus      There were no vitals filed for this visit.  Visit Diagnosis:  Abnormality of gait  Right leg weakness      Subjective Assessment - 11/01/15 1319    Subjective Pt reported she has noticed has been crying a lot and called MD to see if she can change Cymbalta. Pt reported she took out heel wedge on Sunday as it felt "off" and she has been using SPC all the time.   Pertinent History Seizures, low BP   Currently in Pain? No/denies                         [facility] Adult PT Treatment/Exercise - 11/01/15 1321    Ambulation/Gait   Ambulation/Gait Yes   Ambulation/Gait Assistance 5: Supervision;4: Min guard   Ambulation/Gait Assistance Details Pt performed BWSTT, two 7-minute bouts with one standing rest break between session, tactile and verbal cues, along with manual facilitation to decr. R hip ER, improve R  hip/knee flexion and improve stride length. Pt also amb. over even terrain, after BWSTT, without heel wedge donned. Cues to improve L step length, decrease R elbow flexion. pt reported she can actually feel her heel striking the floor.    Ambulation Distance (Feet) --  115'x5 and 0.12 miles and 0.13 miles on treadmill   Assistive device Straight cane;Body weight support system   Gait Pattern Decreased step length - right;Decreased stance time - right;Decreased weight shift to right;Right foot flat;Decreased hip/knee flexion - right   Ambulation Surface Level;Indoor                PT Education - 11/01/15 2104    Education provided Yes   Education Details PT discussed the importance of calling MD again, if she does not hear back from their office regarding her crying episodes/medication questions. PT also provided pt with blue theraband, as pt reported green band is "too easy" during terminal knee ext. HEP. PT discussed not using heel wedge at this time, as pt has no incr. in hip/knee pain, no genu recurvatum and is amb. with less gait deviations.   Person(s) Educated Patient   Methods Explanation   Comprehension Verbalized understanding          PT Short Term Goals -  08/23/15 1203    PT SHORT TERM GOAL #1   Title Pt will be IND in HEP to improve strength, balance, and endurance. Target date: 07/27/15.   Status Partially Met   PT SHORT TERM GOAL #2   Title Pt will improve gait speed to >/=1.93f/sec to decrease falls risk. Target date: 07/27/15.   Status Achieved   PT SHORT TERM GOAL #3   Title Pt will ambulate 300' with LRAD, over even terrain, at MOD I level to improve functional mobility. Target date: 07/27/15.   Status Achieved   PT SHORT TERM GOAL #4   Title Pt will perform TUG in </=13.5 seconds with LRAD to decr. falls risk. Target date: 07/27/15.   Status Partially Met   PT SHORT TERM GOAL #5   Title Perform BERG and write STG and LTG as appropriate. Target date:  07/27/15.   Status Achieved   PT SHORT TERM GOAL #6   Title Pt will improve BERG to >/=51/56 to decr. falls risk. Target date: 07/27/15.   Status Achieved           PT Long Term Goals - 09/27/15 1209    PT LONG TERM GOAL #1   Title Pt will ambulate 1000' over even/uneven terrain, with LRAD, at MOD I level to improve functional mobiliity. Target date: 11/12/14.   Baseline deferred to new 4 week POC. All unmet goals will be carried over to new 7 week POC: 11/12/14.   Status On-going   PT LONG TERM GOAL #2   Title Pt will amb. 300' over even terrain without an AD, IND, to safely amb. in home. Target date: 11/12/14.   Status On-going   PT LONG TERM GOAL #3   Title Pt will improve gait speed to >/=2.655fsec, with LRAD, to amb. safely in the community. Target date: 11/12/14.   Status On-going   PT LONG TERM GOAL #4   Title Pt will verbalize plans to join a fitness center upon d/c from PT to continue to maintain strength and endurance gains made during PT. Target date: 11/12/14.   Status On-going   PT LONG TERM GOAL #5   Title Pt improve BERG score to >/=55/56 to decr. falls risk. Target date: 11/12/14.   Status On-going               Plan - 11/01/15 2106    Clinical Impression Statement Pt demonstrated progress, as she was able to [place] (1.68[place]1m during BWSTT. Pt also able to amb. with improve L step length, indicating improved wt. shlifting onto R LE, and R and L step lengths were nearly equal at 55cm and 52cm. Pt continues to require tactile and VC's to decr. R elbow flexion, improve L heel strike and stride length, and to decr. R hip ER when fatigued. Continue with POC.   Pt will benefit from skilled therapeutic intervention in order to improve on the following deficits Abnormal gait;Decreased endurance;Impaired sensation;Decreased knowledge of precautions;Decreased activity tolerance;Decreased knowledge of use of DME;Decreased strength;Impaired UE functional use;Impaired  tone;Decreased balance;Decreased mobility;Decreased cognition;Decreased range of motion;Decreased safety awareness;Decreased coordination;Impaired flexibility   Rehab Potential Good   Clinical Impairments Affecting Rehab Potential Seizures which may limit intensity of PT   PT Frequency 2x / week   PT Duration Other (comment)  add'l 7 weeks   PT Treatment/Interventions ADLs/Self Care Home Management;Neuromuscular re-education;Cognitive remediation;Biofeedback;DME Instruction;Gait training;Stair training;Canalith Repostioning;Patient/family education;Orthotic Fit/Training;Balance training;Therapeutic exercise;Manual techniques;Therapeutic activities;Vestibular   PT Next Visit Plan Core strengthening and leg press (  and other LE strength training)   PT Home Exercise Plan Stretching/strength/balance HEP   Consulted and Agree with Plan of Care Patient   Family Member Consulted pt's husband-[name]        Problem List Patient Active Problem List   Diagnosis Date Noted  . Spastic hemiplegia and hemiparesis affecting dominant side ([place]) 09/13/2015  . Adhesive capsulitis of right shoulder 08/24/2015  . Symptomatic partial epilepsy with simple partial seizures ([place]) 08/21/2015  . Nontraumatic cortical hemorrhage of cerebral hemisphere ([place])   . Seizure ([place])   . Seizures ([place])   . Adjustment disorder with depressed mood   . Contracture of muscle ankle and foot 06/11/2015  . Hemiplga fol ntrm intcrbl hemor aff right dominant side 06/06/2015  . Left-sided intracerebral hemorrhage ([place]) 06/04/2015  . Seizure disorder as sequela of cerebrovascular accident ([place])   . Seizure disorder ([place]) 06/03/2015  . Sepsis ([place]) 06/03/2015  . UTI (urinary tract infection) 06/03/2015  . Hypotension 06/02/2015  . Right spastic hemiparesis ([place]) 05/28/2015  . 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 History of anxiety disorder 05/28/2015  . ICH (intracerebral hemorrhage) ([place])   .  Seizure disorder, nonconvulsive, with status epilepticus ([place])   . Cerebral venous thrombosis of cortical vein   . Cytotoxic cerebral edema ([place])   . IVH (intraventricular hemorrhage) ([place])   . Term pregnancy 05/07/2015  . Spontaneous vaginal delivery 05/07/2015    Margaux Engen L 11/01/2015, 9:12 PM  [facility] [address] [place], [place], [address] Phone: [phone]   Fax:  [phone]  Name: Ashley [name] MRN: [id number] Date of Birth: [birth year]    Geoffry Paradise, PT,DPT 11/01/2015 9:12 PM Phone: [phone] Fax: [phone]

## 2015-11-01 NOTE — Therapy (Signed)
[facility] [address] [place], [place], [address] Phone: [phone]   Fax:  [phone]  Occupational Therapy Treatment  Patient Details  Name: Ashley [name] MRN: [id number] Date of Birth: Dec 06, 1975 No Data Recorded  Encounter Date: 11/01/2015      OT End of Session - 11/01/15 1611    Visit Number 5   Number of Visits 15   Authorization Type BCBS   Authorization Time Period 30 visit COMBINED between PT/OT   Authorization - Visit Number 5   Authorization - Number of Visits 15   OT Start Time 1401   OT Stop Time 1445   OT Time Calculation (min) 44 min   Activity Tolerance Patient tolerated treatment well      Past Medical History  Diagnosis Date  . Anxiety   . ICH (intracerebral hemorrhage) ([place])   . Stroke [facility])     Past Surgical History  Procedure Laterality Date  . Dilation and curettage of uterus      There were no vitals filed for this visit.  Visit Diagnosis:  Right spastic hemiparesis (HCC)  Pain in joint of right shoulder  Decreased coordination  Weakness due to cerebrovascular accident      Subjective Assessment - 11/01/15 1409    Subjective  My husband is coming to talk with busines manager about my future appointments   Pertinent History see epic snapshot; pt with [facility] 2 weeks after deliverying her 3rd child   Patient Stated Goals to be able to use my R arm normally   Currently in Pain? No/denies                      OT Treatments/Exercises (OP) - 11/01/15 0001    Neurological Re-education Exercises   Other Exercises 1 Neuro re ed in supine intially to improve alignment and gliding of scapula in preparation for overhead reach in supine and mid reach in sitting. Progressed to bilateral overhead reach in sitting with min facilitation in high reach.  Progresed to unilateral reach with functional task in sitting with min facilitation to approximately 95* of shoulder  flexion with emphasis on trunk alignment, activation of trunk in sitting, weight shfit to R with reach, to increase activity in trunk and scapula. Pt with improved ability to over ride compensations in mid reach.                   OT Short Term Goals - 11/01/15 1559    OT SHORT TERM GOAL #1   Title --   Status --   OT SHORT TERM GOAL #2   Title --   Baseline --   Status --   OT SHORT TERM GOAL #3   Title --   Status --   OT SHORT TERM GOAL #4   Title --   Status --   OT SHORT TERM GOAL #5   Title --   Status --   OT SHORT TERM GOAL #6   Title --   Baseline met per pt report   Period Weeks   Status --   OT SHORT TERM GOAL #7   Title --   Baseline with min compensations and no pain   Period Weeks   Status --   OT SHORT TERM GOAL #8   Title Pt will retrieve a lightweight object at 100 shoulder flexion with min compensations and pain no greater than 3/10. - 12/07/2015   Time 6   Period  Weeks   Status On-going   OT SHORT TERM GOAL  #9   TITLE Pt will use RUE as an active assist for ADLs/ IADLS 70 % of the time.   Time 6   Period Weeks   Status On-going   OT SHORT TERM GOAL  #10   TITLE Pt will report that she is able to fasten her bra and perform toileting hygiene with greater ease.   Time 6   Period Weeks   Status On-going           OT Long Term Goals - 11/01/15 1601    OT LONG TERM GOAL #1   Baseline  difficulty with hygeine for toileting and fastening bra   Period Weeks   OT LONG TERM GOAL #2   Title revised long term goal- modified independent with cooking and home management with pt demonstrating good safety awareness - 01/18/2016   Time 12   Period Weeks   Status On-going   OT LONG TERM GOAL #3   Baseline goal deferred due to shoulder pain   Period Weeks   OT LONG TERM GOAL #4   Title revised goal- Pt will use RUE as an active assist 75% of the time - see updated short and long term goals 01/18/2016   Baseline pt reports using 60% of the time as  active assist   Time 12   Status On-going   OT LONG TERM GOAL #5   Title Pt will demonstrate ability to perform a physical and cognitive task simultaneously with 80% or better accuracy.   Baseline not fully addressed due to recent shoulder pain-    Status Deferred   OT LONG TERM GOAL #6   Baseline 25.47 secs   OT LONG TERM GOAL #7   Title Pt will report using RUE as an active assist 80% of the time for ADLs/ IADLS. 06/18/931   Time 12   Period Weeks   Status On-going   OT LONG TERM GOAL #8   Title Pt will retrieve a light weight object at 110  shoulder flexion with only min compensations.   Time 12   Period Weeks   Status On-going   OT LONG TERM GOAL  #9   Baseline I with updated HEP.   Time 12   Period Weeks   Status On-going               Plan - 11/01/15 1607    Clinical Impression Statement Pt making good progress toward goals - pt with improved ability to self monitor and over ride compensatory movements at mid reach   Pt will benefit from skilled therapeutic intervention in order to improve on the following deficits (Retired) Abnormal gait;Decreased coordination;Decreased range of motion;Difficulty walking;Impaired flexibility;Decreased endurance;Decreased safety awareness;Increased edema;Impaired tone;Decreased knowledge of precautions;Decreased activity tolerance;Decreased balance;Decreased knowledge of use of DME;Pain;Impaired UE functional use;Impaired vision/preception;Decreased cognition;Decreased mobility;Decreased strength   Rehab Potential Good   OT Frequency --  15 visits   OT Duration 12 weeks   OT Treatment/Interventions Self-care/ADL training;Therapeutic exercise;Patient/family education;Balance training;Ultrasound;Neuromuscular education;Manual Therapy;Splinting;Therapeutic exercises;Energy conservation;Parrafin;DME and/or AE instruction;Therapeutic activities;Cognitive remediation/compensation;Gait Training;Fluidtherapy;Electrical Stimulation;Moist  Heat;Contrast Bath;Passive range of motion;Visual/perceptual remediation/compensation   Plan address pain prn, NMR for mid to high reach   OT Home Exercise Plan issued: ball ex in supine, trunk for A-p tilts with trunk rotation(07/05/15)   Consulted and Agree with Plan of Care Patient        Problem List Patient Active Problem List   Diagnosis Date Noted  .  Spastic hemiplegia and hemiparesis affecting dominant side ([place]) 09/13/2015  . Adhesive capsulitis of right shoulder 08/24/2015  . Symptomatic partial epilepsy with simple partial seizures ([place]) 08/21/2015  . Nontraumatic cortical hemorrhage of cerebral hemisphere ([place])   . Seizure ([place])   . Seizures ([place])   . Adjustment disorder with depressed mood   . Contracture of muscle ankle and foot 06/11/2015  . Hemiplga fol ntrm intcrbl hemor aff right dominant side 06/06/2015  . Left-sided intracerebral hemorrhage ([place]) 06/04/2015  . Seizure disorder as sequela of cerebrovascular accident ([place])   . Seizure disorder ([place]) 06/03/2015  . Sepsis ([place]) 06/03/2015  . UTI (urinary tract infection) 06/03/2015  . Hypotension 06/02/2015  . Right spastic hemiparesis ([place]) 05/28/2015  . 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 History of anxiety disorder 05/28/2015  . ICH (intracerebral hemorrhage) ([place])   . Seizure disorder, nonconvulsive, with status epilepticus ([place])   . Cerebral venous thrombosis of cortical vein   . Cytotoxic cerebral edema ([place])   . IVH (intraventricular hemorrhage) ([place])   . Term pregnancy 05/07/2015  . Spontaneous vaginal delivery 05/07/2015    Quay Burow, OTR/L 11/01/2015, 4:14 PM  [facility] [address] [place], [place], [address] Phone: [phone]   Fax:  [phone]  Name: Ashley [name] MRN: [id number] Date of Birth: [birth year]

## 2015-11-02 ENCOUNTER — Encounter: Payer: Self-pay | Admitting: Occupational Therapy

## 2015-11-02 ENCOUNTER — Ambulatory Visit: Payer: BLUE CROSS/BLUE SHIELD | Admitting: Occupational Therapy

## 2015-11-02 ENCOUNTER — Ambulatory Visit: Payer: BLUE CROSS/BLUE SHIELD

## 2015-11-02 DIAGNOSIS — R269 Unspecified abnormalities of gait and mobility: Secondary | ICD-10-CM

## 2015-11-02 DIAGNOSIS — R278 Other lack of coordination: Secondary | ICD-10-CM

## 2015-11-02 DIAGNOSIS — R279 Unspecified lack of coordination: Secondary | ICD-10-CM

## 2015-11-02 DIAGNOSIS — G8111 Spastic hemiplegia affecting right dominant side: Secondary | ICD-10-CM

## 2015-11-02 DIAGNOSIS — M25511 Pain in right shoulder: Secondary | ICD-10-CM

## 2015-11-02 DIAGNOSIS — R29898 Other symptoms and signs involving the musculoskeletal system: Secondary | ICD-10-CM

## 2015-11-02 NOTE — Therapy (Signed)
[facility] [address] [place], [place], [address] Phone: [phone]   Fax:  [phone]  Occupational Therapy Treatment  Patient Details  Name: Ashley [name] MRN: [id number] Date of Birth: Jun 14, 1976 No Data Recorded  Encounter Date: 11/02/2015      OT End of Session - 11/02/15 1157    Visit Number 6   Number of Visits 15   Date for OT Re-Evaluation 10/27/15   Authorization Type BCBS   Authorization Time Period 30 visit COMBINED between PT/OT   Authorization - Visit Number 6   Authorization - Number of Visits 15   OT Start Time 1101   OT Stop Time 1145   OT Time Calculation (min) 44 min   Activity Tolerance Patient tolerated treatment well   Behavior During Therapy [facility] for tasks assessed/performed      Past Medical History  Diagnosis Date  . Anxiety   . ICH (intracerebral hemorrhage) ([place])   . Stroke [facility])     Past Surgical History  Procedure Laterality Date  . Dilation and curettage of uterus      There were no vitals filed for this visit.  Visit Diagnosis:  Right spastic hemiparesis (HCC)  Pain in joint of right shoulder  Decreased coordination      Subjective Assessment - 11/02/15 1150    Subjective  I feel like it is getting better - regarding right arm motion   Pertinent History see epic snapshot; pt with SAH 2 weeks after deliverying her 3rd child   Patient Stated Goals to be able to use my R arm normally   Currently in Pain? No/denies   Pain Score 0-No pain                      OT Treatments/Exercises (OP) - 11/02/15 0001    Neurological Re-education Exercises   Other Exercises 1 Neuro reeducation to address shoulder biomechanics in forward reaching.  Started supine to address active, controlled motion in mid to high level reach pattern.  Transitioned to sidelying to activate scapular stabilizers, and maintain stable riht arm with trunk motion on arm.  Assisted patient  into quadruped to address scapulohumeral rhythm with right arm stable.  Worked to help scapula stabilize on thorax during this activity.  Patient with significant limitations in scapula dissociation from humerus, and significant proximal weakness around vertebral border of scapula.     Other Exercises 2 Worked in sitting with long arm weight bearing, again addressing body on arm motion.  Patient able to weight shift toward right hip easier with repetition following initial facilitation.  In standing worked to improve trunk UE posture with simple weight shifting, stepping pattern.  Patient has difficulty grading amount o force recruited throughout right UE - but responded well to visual , verbal cueing in addition to physical handling.,                OT Education - 11/02/15 1156    Education provided Yes   Education Details Use of right arm to aide in transition from supine sitting position   Person(s) Educated Patient   Methods Explanation;Demonstration;Tactile cues;Verbal cues   Comprehension Need further instruction;Tactile cues required;Verbal cues required          OT Short Term Goals - 11/01/15 1559    OT SHORT TERM GOAL #1   Title --   Status --   OT SHORT TERM GOAL #2   Title --   Baseline --  Status --   OT SHORT TERM GOAL #3   Title --   Status --   OT SHORT TERM GOAL #4   Title --   Status --   OT SHORT TERM GOAL #5   Title --   Status --   OT SHORT TERM GOAL #6   Title --   Baseline met per pt report   Period Weeks   Status --   OT SHORT TERM GOAL #7   Title --   Baseline with min compensations and no pain   Period Weeks   Status --   OT SHORT TERM GOAL #8   Title Pt will retrieve a lightweight object at 100 shoulder flexion with min compensations and pain no greater than 3/10. - 12/07/2015   Time 6   Period Weeks   Status On-going   OT SHORT TERM GOAL  #9   TITLE Pt will use RUE as an active assist for ADLs/ IADLS 70 % of the time.   Time 6    Period Weeks   Status On-going   OT SHORT TERM GOAL  #10   TITLE Pt will report that she is able to fasten her bra and perform toileting hygiene with greater ease.   Time 6   Period Weeks   Status On-going           OT Long Term Goals - 11/01/15 1601    OT LONG TERM GOAL #1   Baseline  difficulty with hygeine for toileting and fastening bra   Period Weeks   OT LONG TERM GOAL #2   Title revised long term goal- modified independent with cooking and home management with pt demonstrating good safety awareness - 01/18/2016   Time 12   Period Weeks   Status On-going   OT LONG TERM GOAL #3   Baseline goal deferred due to shoulder pain   Period Weeks   OT LONG TERM GOAL #4   Title revised goal- Pt will use RUE as an active assist 75% of the time - see updated short and long term goals 01/18/2016   Baseline pt reports using 60% of the time as active assist   Time 12   Status On-going   OT LONG TERM GOAL #5   Title Pt will demonstrate ability to perform a physical and cognitive task simultaneously with 80% or better accuracy.   Baseline not fully addressed due to recent shoulder pain-    Status Deferred   OT LONG TERM GOAL #6   Baseline 25.47 secs   OT LONG TERM GOAL #7   Title Pt will report using RUE as an active assist 80% of the time for ADLs/ IADLS. 03/14/8314   Time 12   Period Weeks   Status On-going   OT LONG TERM GOAL #8   Title Pt will retrieve a light weight object at 110  shoulder flexion with only min compensations.   Time 12   Period Weeks   Status On-going   OT LONG TERM GOAL  #9   Baseline I with updated HEP.   Time 12   Period Weeks   Status On-going               Plan - 11/02/15 1158    Clinical Impression Statement Patient showing improvment in volitional control of right UE - to move forward to OT goals   Pt will benefit from skilled therapeutic intervention in order to improve on the following deficits (Retired) Abnormal gait;Decreased  coordination;Decreased range of motion;Difficulty walking;Impaired flexibility;Decreased endurance;Decreased safety awareness;Increased edema;Impaired tone;Decreased knowledge of precautions;Decreased activity tolerance;Decreased balance;Decreased knowledge of use of DME;Pain;Impaired UE functional use;Impaired vision/preception;Decreased cognition;Decreased mobility;Decreased strength   Rehab Potential Good   OT Duration 12 weeks   OT Treatment/Interventions Self-care/ADL training;Therapeutic exercise;Patient/family education;Balance training;Ultrasound;Neuromuscular education;Manual Therapy;Splinting;Therapeutic exercises;Energy conservation;Parrafin;DME and/or AE instruction;Therapeutic activities;Cognitive remediation/compensation;Gait Training;Fluidtherapy;Electrical Stimulation;Moist Heat;Contrast Bath;Passive range of motion;Visual/perceptual remediation/compensation   Plan address pain as needed, NMR mid to high reach, also return from reach, horiz adduction and shoulder extension (reach behind body)   Consulted and Agree with Plan of Care Patient        Problem List Patient Active Problem List   Diagnosis Date Noted  . Spastic hemiplegia and hemiparesis affecting dominant side ([place]) 09/13/2015  . Adhesive capsulitis of right shoulder 08/24/2015  . Symptomatic partial epilepsy with simple partial seizures ([place]) 08/21/2015  . Nontraumatic cortical hemorrhage of cerebral hemisphere ([place])   . Seizure ([place])   . Seizures ([place])   . Adjustment disorder with depressed mood   . Contracture of muscle ankle and foot 06/11/2015  . Hemiplga fol ntrm intcrbl hemor aff right dominant side 06/06/2015  . Left-sided intracerebral hemorrhage ([place]) 06/04/2015  . Seizure disorder as sequela of cerebrovascular accident ([place])   . Seizure disorder ([place]) 06/03/2015  . Sepsis ([place]) 06/03/2015  . UTI (urinary tract infection) 06/03/2015  . Hypotension 06/02/2015  . Right spastic hemiparesis ([place])  05/28/2015  . 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 History of anxiety disorder 05/28/2015  . ICH (intracerebral hemorrhage) ([place])   . Seizure disorder, nonconvulsive, with status epilepticus ([place])   . Cerebral venous thrombosis of cortical vein   . Cytotoxic cerebral edema ([place])   . IVH (intraventricular hemorrhage) ([place])   . Term pregnancy 05/07/2015  . Spontaneous vaginal delivery 05/07/2015    Mariah Milling, OTR/L 11/02/2015, 12:01 PM  [facility] [address] [place], [place], [address] Phone: [phone]   Fax:  [phone]  Name: Edeline Greening MRN: [id number] Date of Birth: 1976/04/23

## 2015-11-02 NOTE — Therapy (Signed)
[name] [address] [place], [place], [address] Phone: [phone]   Fax:  [phone]  Physical Therapy Treatment  Patient Details  Name: Ashley [name] MRN: [id number] Date of Birth: 12/06/1975 No Data Recorded  Encounter Date: 11/02/2015      PT End of Session - 11/02/15 1238    Visit Number 33   Number of Visits 39   Date for PT Re-Evaluation 11/13/15   Authorization Type BCBS. Per Laverda Page, visits now 30 for PT and OT total (15 per OT and 15 per PT).   Authorization - Visit Number 6   Authorization - Number of Visits 15   PT Start Time 1146   PT Stop Time 1230   PT Time Calculation (min) 44 min   Equipment Utilized During Treatment Gait belt  and BWSTT harness   Activity Tolerance Patient tolerated treatment well   Behavior During Therapy WFL for tasks assessed/performed      Past Medical History  Diagnosis Date  . Anxiety   . ICH (intracerebral hemorrhage) ([place])   . Stroke [facility])     Past Surgical History  Procedure Laterality Date  . Dilation and curettage of uterus      There were no vitals filed for this visit.  Visit Diagnosis:  Abnormality of gait  Right leg weakness  Coordination impairment      Subjective Assessment - 11/02/15 1146    Subjective Pt reported her husband spoke with MD regarding Cymbalta and MD is going to have pt try another medication. Pt reported she felt fine after treatment yesterday.   Pertinent History Seizures, low BP   Patient Stated Goals "Be as close to normal as much as possible and to go back to running" Get back to taking care of my kids, 67 weeks old and 4 and 18 years old   Currently in Pain? No/denies                         [facility] Adult PT Treatment/Exercise - 11/02/15 1149    Ambulation/Gait   Ambulation/Gait Yes   Ambulation/Gait Assistance 5: Supervision;4: Min guard   Ambulation/Gait Assistance Details Pt performed BWSTT, one  7-minute interval without R AFO donned.    Ambulation Distance (Feet) --  0.60m. BWSTT no AFO,700' over even terrain with AFO donned   Assistive device Straight cane;Body weight support system   Gait Pattern Decreased step length - right;Decreased stance time - right;Decreased weight shift to right;Right foot flat;Decreased hip/knee flexion - right   Ambulation Surface Level;Indoor   Knee/Hip Exercises: Machines for Strengthening   Cybex Leg Press 3x12 reps with B LEs, 100lbs. Cues during last 2 reps not to hyperextend R knee. Cues to fully extend R knee during first 5 reps.             Balance Exercises - 11/02/15 1235    Balance Exercises: Seated   Other Seated Exercises While seated on physioball pt performed ball toss/catch x50 reps with feet apart and together. Min guard to min A required to maintain balance. Cues for upright posture and to engage abdominal muscles to improve balance.           PT Education - 11/01/15 2104    Education provided Yes   Education Details PT discussed the importance of calling MD again, if she does not hear back from their office regarding her crying episodes/medication questions. PT also provided pt with blue theraband, as  pt reported green band is "too easy" during terminal knee ext. HEP. PT discussed not using heel wedge at this time, as pt has no incr. in hip/knee pain, no genu recurvatum and is amb. with less gait deviations.   Person(s) Educated Patient   Methods Explanation   Comprehension Verbalized understanding          PT Short Term Goals - 08/23/15 1203    PT SHORT TERM GOAL #1   Title Pt will be IND in HEP to improve strength, balance, and endurance. Target date: 07/27/15.   Status Partially Met   PT SHORT TERM GOAL #2   Title Pt will improve gait speed to >/=1.63f/sec to decrease falls risk. Target date: 07/27/15.   Status Achieved   PT SHORT TERM GOAL #3   Title Pt will ambulate 300' with LRAD, over even terrain, at MOD I  level to improve functional mobility. Target date: 07/27/15.   Status Achieved   PT SHORT TERM GOAL #4   Title Pt will perform TUG in </=13.5 seconds with LRAD to decr. falls risk. Target date: 07/27/15.   Status Partially Met   PT SHORT TERM GOAL #5   Title Perform BERG and write STG and LTG as appropriate. Target date: 07/27/15.   Status Achieved   PT SHORT TERM GOAL #6   Title Pt will improve BERG to >/=51/56 to decr. falls risk. Target date: 07/27/15.   Status Achieved           PT Long Term Goals - 09/27/15 1209    PT LONG TERM GOAL #1   Title Pt will ambulate 1000' over even/uneven terrain, with LRAD, at MOD I level to improve functional mobiliity. Target date: 11/12/14.   Baseline deferred to new 4 week POC. All unmet goals will be carried over to new 7 week POC: 11/12/14.   Status On-going   PT LONG TERM GOAL #2   Title Pt will amb. 300' over even terrain without an AD, IND, to safely amb. in home. Target date: 11/12/14.   Status On-going   PT LONG TERM GOAL #3   Title Pt will improve gait speed to >/=2.659fsec, with LRAD, to amb. safely in the community. Target date: 11/12/14.   Status On-going   PT LONG TERM GOAL #4   Title Pt will verbalize plans to join a fitness center upon d/c from PT to continue to maintain strength and endurance gains made during PT. Target date: 11/12/14.   Status On-going   PT LONG TERM GOAL #5   Title Pt improve BERG score to >/=55/56 to decr. falls risk. Target date: 11/12/14.   Status On-going               Plan - 11/02/15 1238    Clinical Impression Statement Pt demonstrated progress, as she was able to amb. on BWSTT system without R AFO. Pt's R LE strength continues to improve, as she was able to tolerate incr. wt. and reps on leg press. Pt required less VC's during gait to improve L step length and lateral wt. shifting to R side. Pt would continue to benefit from skilled PT to improve safety during functoinal mobility.   Pt will  benefit from skilled therapeutic intervention in order to improve on the following deficits Abnormal gait;Decreased endurance;Impaired sensation;Decreased knowledge of precautions;Decreased activity tolerance;Decreased knowledge of use of DME;Decreased strength;Impaired UE functional use;Impaired tone;Decreased balance;Decreased mobility;Decreased cognition;Decreased range of motion;Decreased safety awareness;Decreased coordination;Impaired flexibility   Rehab Potential Good   Clinical  Impairments Affecting Rehab Potential Seizures which may limit intensity of PT   PT Frequency 2x / week   PT Duration --  7 add'l weeks   PT Treatment/Interventions ADLs/Self Care Home Management;Neuromuscular re-education;Cognitive remediation;Biofeedback;DME Instruction;Gait training;Stair training;Canalith Repostioning;Patient/family education;Orthotic Fit/Training;Balance training;Therapeutic exercise;Manual techniques;Therapeutic activities;Vestibular   PT Next Visit Plan Begin to assess LTGs.   PT Home Exercise Plan Stretching/strength/balance HEP   Consulted and Agree with Plan of Care Patient        Problem List Patient Active Problem List   Diagnosis Date Noted  . Spastic hemiplegia and hemiparesis affecting dominant side ([place]) 09/13/2015  . Adhesive capsulitis of right shoulder 08/24/2015  . Symptomatic partial epilepsy with simple partial seizures ([place]) 08/21/2015  . Nontraumatic cortical hemorrhage of cerebral hemisphere ([place])   . Seizure ([place])   . Seizures ([place])   . Adjustment disorder with depressed mood   . Contracture of muscle ankle and foot 06/11/2015  . Hemiplga fol ntrm intcrbl hemor aff right dominant side 06/06/2015  . Left-sided intracerebral hemorrhage ([place]) 06/04/2015  . Seizure disorder as sequela of cerebrovascular accident ([place])   . Seizure disorder ([place]) 06/03/2015  . Sepsis ([place]) 06/03/2015  . UTI (urinary tract infection) 06/03/2015  . Hypotension 06/02/2015  . Right  spastic hemiparesis ([place]) 05/28/2015  . 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 History of anxiety disorder 05/28/2015  . ICH (intracerebral hemorrhage) ([place])   . Seizure disorder, nonconvulsive, with status epilepticus ([place])   . Cerebral venous thrombosis of cortical vein   . Cytotoxic cerebral edema ([place])   . IVH (intraventricular hemorrhage) ([place])   . Term pregnancy 05/07/2015  . Spontaneous vaginal delivery 05/07/2015    Miller,Jennifer L 11/02/2015, 12:43 PM  [facility] [address] [place], [place], [address] Phone: [phone]   Fax:  [phone]  Name: Arleen Bar MRN: [id number] Date of Birth: November 07, 1975    Geoffry Paradise, PT,DPT 11/02/2015 12:44 PM Phone: [phone] Fax: [phone]

## 2015-11-06 ENCOUNTER — Ambulatory Visit: Payer: BLUE CROSS/BLUE SHIELD | Admitting: Occupational Therapy

## 2015-11-06 ENCOUNTER — Encounter: Payer: Self-pay | Admitting: Occupational Therapy

## 2015-11-06 ENCOUNTER — Ambulatory Visit: Payer: BLUE CROSS/BLUE SHIELD

## 2015-11-06 DIAGNOSIS — R278 Other lack of coordination: Secondary | ICD-10-CM

## 2015-11-06 DIAGNOSIS — R269 Unspecified abnormalities of gait and mobility: Secondary | ICD-10-CM

## 2015-11-06 DIAGNOSIS — R279 Unspecified lack of coordination: Secondary | ICD-10-CM

## 2015-11-06 DIAGNOSIS — M25511 Pain in right shoulder: Secondary | ICD-10-CM

## 2015-11-06 DIAGNOSIS — R29898 Other symptoms and signs involving the musculoskeletal system: Secondary | ICD-10-CM

## 2015-11-06 DIAGNOSIS — G8111 Spastic hemiplegia affecting right dominant side: Secondary | ICD-10-CM

## 2015-11-06 DIAGNOSIS — R252 Cramp and spasm: Secondary | ICD-10-CM

## 2015-11-06 DIAGNOSIS — IMO0002 Reserved for concepts with insufficient information to code with codable children: Secondary | ICD-10-CM

## 2015-11-06 NOTE — Therapy (Signed)
[facility] [address] [place], [place], [address] Phone: [phone]   Fax:  [phone]  Physical Therapy Treatment  Patient Details  Name: Ashley [name] MRN: [id number] Date of Birth: 1975/11/26 No Data Recorded  Encounter Date: 11/06/2015      PT End of Session - 11/06/15 1410    Visit Number 34   Number of Visits 39   Date for PT Re-Evaluation 12/13/15  requesting add'l 4 weeks   Authorization Type BCBS. Per Laverda Page, visits now 30 for PT and OT total (15 per OT and 15 per PT). Pt wishes to continue at self pay after visit limit met.    Authorization - Visit Number 7   Authorization - Number of Visits 15   PT Start Time 3785  pt arrived late   PT Stop Time 1400   PT Time Calculation (min) 41 min   Equipment Utilized During Treatment Gait belt   Activity Tolerance Patient tolerated treatment well   Behavior During Therapy WFL for tasks assessed/performed      Past Medical History  Diagnosis Date  . Anxiety   . ICH (intracerebral hemorrhage) ([place])   . Stroke [facility])     Past Surgical History  Procedure Laterality Date  . Dilation and curettage of uterus      There were no vitals filed for this visit.  Visit Diagnosis:  Abnormality of gait  Right leg weakness  Spasticity  Decreased coordination      Subjective Assessment - 11/06/15 1322    Subjective Pt reported she feels as though she might have experienced a concussion after she fell a few weeks ago, as pt has experienced emotional lability, HAs (not severe), and incr. memory loss. Pt reports HAs have gotten better. Pt reported she started new medication on Saturday but doesn't know the name as they are samples.    Pertinent History Seizures, low BP   Patient Stated Goals "Be as close to normal as much as possible and to go back to running" Get back to taking care of my kids, 51 weeks old and 72 and 67 years old   Currently in Pain? No/denies                          [facility] Adult PT Treatment/Exercise - 11/06/15 1330    Ambulation/Gait   Ambulation/Gait Yes   Ambulation/Gait Assistance 5: Supervision;4: Min guard   Ambulation/Gait Assistance Details S to MOD I over even terrain and paved surfaces and min guard over uneven terrain. Pt required minimal cues to decr. R elbow flexion and wt. shifting onto R LE.   Ambulation Distance (Feet) --  200' indoors and 400' outdoors    Assistive device Straight cane   Gait Pattern Decreased step length - right;Decreased stance time - right;Decreased weight shift to right;Right foot flat;Decreased hip/knee flexion - right   Ambulation Surface Level;Indoor   Gait velocity 2.91f/sec. with SPC and 2.548fsec. without AD   Curb Other (comment);4: Min assist  min guard   Curb Details (indicate cue type and reason) Min guard to ascend curb and min guard to min A to descend curb with SPC.  Cues for sequencing, techinique, and to improve ant. wt. shifting during step up.    Standardized Balance Assessment   Standardized Balance Assessment Berg Balance Test   Berg Balance Test   Sit to Stand Able to stand without using hands and stabilize independently   Standing  Unsupported Able to stand safely 2 minutes   Sitting with Back Unsupported but Feet Supported on Floor or Stool Able to sit safely and securely 2 minutes   Stand to Sit Sits safely with minimal use of hands   Transfers Able to transfer safely, minor use of hands   Standing Unsupported with Eyes Closed Able to stand 10 seconds safely   Standing Ubsupported with Feet Together Able to place feet together independently and stand 1 minute safely   From Standing, Reach Forward with Outstretched Arm Can reach confidently >25 cm (10")  L UE >10' and R UE 7.5"   From Standing Position, Pick up Object from Floor Able to pick up shoe safely and easily   From Standing Position, Turn to Look Behind Over each Shoulder Looks behind from  both sides and weight shifts well   Turn 360 Degrees Able to turn 360 degrees safely one side only in 4 seconds or less   Standing Unsupported, Alternately Place Feet on Step/Stool Able to stand independently and complete 8 steps >20 seconds   Standing Unsupported, One Foot in Front Able to plae foot ahead of the other independently and hold 30 seconds   Standing on One Leg Able to lift leg independently and hold 5-10 seconds   Total Score 52           Self Care:     PT Education - 11/06/15 1407    Education provided Yes   Education Details PT educated pt on the proper technique to traverse curbs, PT educated pt on outcome measure results and goal progress. PT discussed renewal for 4 additional weeks, 2x/week. Pt agreeable. PT encouraged pt to notify MD of emotional and memory symptoms after falling, as pt is worried she experienced a concussion after falling a few weeks ago.   Person(s) Educated Patient   Methods Explanation   Comprehension Verbalized understanding          PT Short Term Goals - 08/23/15 1203    PT SHORT TERM GOAL #1   Title Pt will be IND in HEP to improve strength, balance, and endurance. Target date: 07/27/15.   Status Partially Met   PT SHORT TERM GOAL #2   Title Pt will improve gait speed to >/=1.57f/sec to decrease falls risk. Target date: 07/27/15.   Status Achieved   PT SHORT TERM GOAL #3   Title Pt will ambulate 300' with LRAD, over even terrain, at MOD I level to improve functional mobility. Target date: 07/27/15.   Status Achieved   PT SHORT TERM GOAL #4   Title Pt will perform TUG in </=13.5 seconds with LRAD to decr. falls risk. Target date: 07/27/15.   Status Partially Met   PT SHORT TERM GOAL #5   Title Perform BERG and write STG and LTG as appropriate. Target date: 07/27/15.   Status Achieved   PT SHORT TERM GOAL #6   Title Pt will improve BERG to >/=51/56 to decr. falls risk. Target date: 07/27/15.   Status Achieved           PT  Long Term Goals - 11/06/15 1413    PT LONG TERM GOAL #1   Title Pt will ambulate 1000' over even/uneven terrain, with LRAD, at MOD I level to improve functional mobiliity. Target date: 11/12/14.   Baseline deferred to new 4 week POC. All unmet goals will be carried over to new 4 week POC: 12/11/15   Status Partially Met   PT LONG TERM  GOAL #2   Title Pt will amb. 300' over even terrain without an AD, IND, to safely amb. in home. Target date: 11/12/14.   Status Partially Met   PT LONG TERM GOAL #3   Title Pt will improve gait speed to >/=2.11f/sec, with LRAD, to amb. safely in the community. Target date: 11/12/14.   Status Achieved   PT LONG TERM GOAL #4   Title Pt will verbalize plans to join a fitness center upon d/c from PT to continue to maintain strength and endurance gains made during PT. Target date: 11/12/14.   Status Deferred   PT LONG TERM GOAL #5   Title Pt improve BERG score to >/=55/56 to decr. falls risk. Target date: 11/12/14.   Status Not Met               Plan - 11/06/15 1411    Clinical Impression Statement Pt demonstrated progress, as she met LTG 3, partially met LTGs 1 and 2. Pt did not meet LTG 5. LTG 4 deferred until d/c. Pt continues to experience incr. postural sway during amb. over uneven terrain, and requires cues to improve lateral wt. shifting onto R LE. Pt would continue to benefit from skilled PT to improve safety during functional mobility. PT will send re-cert to MD.   Pt will benefit from skilled therapeutic intervention in order to improve on the following deficits Abnormal gait;Decreased endurance;Impaired sensation;Decreased knowledge of precautions;Decreased activity tolerance;Decreased knowledge of use of DME;Decreased strength;Impaired UE functional use;Impaired tone;Decreased balance;Decreased mobility;Decreased cognition;Decreased range of motion;Decreased safety awareness;Decreased coordination;Impaired flexibility   Rehab Potential Good   Clinical  Impairments Affecting Rehab Potential Seizures which may limit intensity of PT   PT Frequency 2x / week   PT Duration 4 weeks   PT Treatment/Interventions ADLs/Self Care Home Management;Neuromuscular re-education;Cognitive remediation;Biofeedback;DME Instruction;Gait training;Stair training;Canalith Repostioning;Patient/family education;Orthotic Fit/Training;Balance training;Therapeutic exercise;Manual techniques;Therapeutic activities;Vestibular   PT Home Exercise Plan Stretching/strength/balance HEP   Consulted and Agree with Plan of Care Patient        Problem List Patient Active Problem List   Diagnosis Date Noted  . Spastic hemiplegia and hemiparesis affecting dominant side ([place] 09/13/2015  . Adhesive capsulitis of right shoulder 08/24/2015  . Symptomatic partial epilepsy with simple partial seizures ([place] 08/21/2015  . Nontraumatic cortical hemorrhage of cerebral hemisphere ([place]   . Seizure ([place]   . Seizures ([place]   . Adjustment disorder with depressed mood   . Contracture of muscle ankle and foot 06/11/2015  . Hemiplga fol ntrm intcrbl hemor aff right dominant side 06/06/2015  . Left-sided intracerebral hemorrhage ([place] 06/04/2015  . Seizure disorder as sequela of cerebrovascular accident ([place]   . Seizure disorder ([place] 06/03/2015  . Sepsis ([place] 06/03/2015  . UTI (urinary tract infection) 06/03/2015  . Hypotension 06/02/2015  . Right spastic hemiparesis ([place] 05/28/2015  . 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 History of anxiety disorder 05/28/2015  . ICH (intracerebral hemorrhage) ([place]   . Seizure disorder, nonconvulsive, with status epilepticus ([place]   . Cerebral venous thrombosis of cortical vein   . Cytotoxic cerebral edema ([place]   . IVH (intraventricular hemorrhage) ([place]   . Term pregnancy 05/07/2015  . Spontaneous vaginal delivery 05/07/2015    Miller,Jennifer L 11/06/2015, 2:15 PM  [facility] [place] [address]Phone: [phone]  Fax:  [phone] Name: JBriannah LonaMRN: [id number] of Birth: 917-Aug-1977   JGeoffry Paradise PT,DPT 11/06/2015 2:15 PM Phone:  [phone] Fax: [phone]

## 2015-11-06 NOTE — Therapy (Signed)
[facility] [address] [place], [place], [address] Phone: [phone]   Fax:  [phone]  Occupational Therapy Treatment  Patient Details  Name: Ashley [name] MRN: [id number] Date of Birth: Jan 22, 1976 No Data Recorded  Encounter Date: 11/06/2015      OT End of Session - 11/06/15 1702    Visit Number 7   Number of Visits 15   Date for OT Re-Evaluation 12/21/15   Authorization Type BCBS   Authorization Time Period 30 visit COMBINED between PT/OT   Authorization - Visit Number 7   Authorization - Number of Visits 15   OT Start Time 1402   OT Stop Time 1444   OT Time Calculation (min) 42 min      Past Medical History  Diagnosis Date  . Anxiety   . ICH (intracerebral hemorrhage) ([place])   . Stroke [facility])     Past Surgical History  Procedure Laterality Date  . Dilation and curettage of uterus      There were no vitals filed for this visit.  Visit Diagnosis:  Right spastic hemiparesis (HCC)  Pain in joint of right shoulder  Coordination impairment  Weakness due to cerebrovascular accident      Subjective Assessment - 11/06/15 1410    Subjective  I can really tell a difference in my arm   Pertinent History see epic snapshot; pt with SAH 2 weeks after deliverying her 3rd child   Patient Stated Goals to be able to use my R arm normally   Currently in Pain? No/denies                      OT Treatments/Exercises (OP) - 11/06/15 0001    Neurological Re-education Exercises   Other Exercises 1 Neuro re ed to address functional reach in RUE starting with open chain unilateral low reach and progressing to mid and beginning high reach with RUE. Pt now able to reach to approximately 90* with min compensations and to 115* with min faciliation for scapular movement . Incorporated lateral weight sihift wtih active trunk to allow for more active scapular support with higher reach - pt improving in her  ability to weight shift to R both in sitting and standing.  Pt also now able to complete more fine motor tasks with mid to beginning high reach both in midline and lateral planes.                    OT Short Term Goals - 11/06/15 1700    OT SHORT TERM GOAL #6   Baseline met per pt report   Period Weeks   OT SHORT TERM GOAL #7   Baseline with min compensations and no pain   Period Weeks   OT SHORT TERM GOAL #8   Title Pt will retrieve a lightweight object at 100 shoulder flexion with min compensations and pain no greater than 3/10. - 12/07/2015   Time 6   Period Weeks   Status On-going   OT SHORT TERM GOAL  #9   TITLE Pt will use RUE as an active assist for ADLs/ IADLS 70 % of the time.   Time 6   Period Weeks   Status On-going   OT SHORT TERM GOAL  #10   TITLE Pt will report that she is able to fasten her bra and perform toileting hygiene with greater ease.   Time 6   Period Weeks   Status On-going  OT Long Term Goals - 11/06/15 1700    OT LONG TERM GOAL #1   Baseline  difficulty with hygeine for toileting and fastening bra   Period Weeks   OT LONG TERM GOAL #2   Title revised long term goal- modified independent with cooking and home management with pt demonstrating good safety awareness - 01/18/2016   Time 12   Period Weeks   Status On-going   OT LONG TERM GOAL #3   Baseline goal deferred due to shoulder pain   Period Weeks   OT LONG TERM GOAL #4   Title revised goal- Pt will use RUE as an active assist 75% of the time - see updated short and long term goals 01/18/2016   Baseline pt reports using 60% of the time as active assist   Time 12   Status On-going   OT LONG TERM GOAL #5   Title Pt will demonstrate ability to perform a physical and cognitive task simultaneously with 80% or better accuracy.   Baseline not fully addressed due to recent shoulder pain-    Status Deferred   OT LONG TERM GOAL #6   Baseline 25.47 secs   OT LONG TERM GOAL #7    Title Pt will report using RUE as an active assist 80% of the time for ADLs/ IADLS. 02/14/6567   Time 12   Period Weeks   Status On-going   OT LONG TERM GOAL #8   Title Pt will retrieve a light weight object at 110  shoulder flexion with only min compensations.   Time 12   Period Weeks   Status On-going   OT LONG TERM GOAL  #9   Baseline I with updated HEP.   Time 12   Period Weeks   Status On-going               Plan - 11/06/15 1701    Clinical Impression Statement Pt making good progress toward goals with improved carry over from session to session.   Pt will benefit from skilled therapeutic intervention in order to improve on the following deficits (Retired) Abnormal gait;Decreased coordination;Decreased range of motion;Difficulty walking;Impaired flexibility;Decreased endurance;Decreased safety awareness;Increased edema;Impaired tone;Decreased knowledge of precautions;Decreased activity tolerance;Decreased balance;Decreased knowledge of use of DME;Pain;Impaired UE functional use;Impaired vision/preception;Decreased cognition;Decreased mobility;Decreased strength   Rehab Potential Good   OT Frequency --  15 visits   OT Duration 12 weeks   OT Treatment/Interventions Self-care/ADL training;Therapeutic exercise;Patient/family education;Balance training;Ultrasound;Neuromuscular education;Manual Therapy;Splinting;Therapeutic exercises;Energy conservation;Parrafin;DME and/or AE instruction;Therapeutic activities;Cognitive remediation/compensation;Gait Training;Fluidtherapy;Electrical Stimulation;Moist Heat;Contrast Bath;Passive range of motion;Visual/perceptual remediation/compensation   Plan address pain as needed, NMR mid to high reach, address ability to reach behind body    Consulted and Agree with Plan of Care Patient        Problem List Patient Active Problem List   Diagnosis Date Noted  . Spastic hemiplegia and hemiparesis affecting dominant side ([place]) 09/13/2015  .  Adhesive capsulitis of right shoulder 08/24/2015  . Symptomatic partial epilepsy with simple partial seizures ([place]) 08/21/2015  . Nontraumatic cortical hemorrhage of cerebral hemisphere ([place])   . Seizure ([place])   . Seizures ([place])   . Adjustment disorder with depressed mood   . Contracture of muscle ankle and foot 06/11/2015  . Hemiplga fol ntrm intcrbl hemor aff right dominant side 06/06/2015  . Left-sided intracerebral hemorrhage ([place]) 06/04/2015  . Seizure disorder as sequela of cerebrovascular accident ([place])   . Seizure disorder ([place]) 06/03/2015  . Sepsis ([place]) 06/03/2015  . UTI (urinary tract infection)  06/03/2015  . Hypotension 06/02/2015  . Right spastic hemiparesis ([place]) 05/28/2015  . 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 History of anxiety disorder 05/28/2015  . ICH (intracerebral hemorrhage) ([place])   . Seizure disorder, nonconvulsive, with status epilepticus ([place])   . Cerebral venous thrombosis of cortical vein   . Cytotoxic cerebral edema ([place])   . IVH (intraventricular hemorrhage) ([place])   . Term pregnancy 05/07/2015  . Spontaneous vaginal delivery 05/07/2015    Quay Burow, OTR/L 11/06/2015, 5:04 PM  [facility] [address] [place], [place], [address] Phone: [phone]   Fax:  [phone]  Name: Lynnann Knudsen MRN: [id number] Date of Birth: 1976/06/20

## 2015-11-08 ENCOUNTER — Ambulatory Visit: Payer: BLUE CROSS/BLUE SHIELD

## 2015-11-08 ENCOUNTER — Ambulatory Visit: Payer: BLUE CROSS/BLUE SHIELD | Admitting: Occupational Therapy

## 2015-11-08 DIAGNOSIS — IMO0002 Reserved for concepts with insufficient information to code with codable children: Secondary | ICD-10-CM

## 2015-11-08 DIAGNOSIS — G8111 Spastic hemiplegia affecting right dominant side: Secondary | ICD-10-CM

## 2015-11-08 DIAGNOSIS — M25511 Pain in right shoulder: Secondary | ICD-10-CM

## 2015-11-08 DIAGNOSIS — R252 Cramp and spasm: Secondary | ICD-10-CM

## 2015-11-08 DIAGNOSIS — R269 Unspecified abnormalities of gait and mobility: Secondary | ICD-10-CM | POA: Diagnosis not present

## 2015-11-08 DIAGNOSIS — R29898 Other symptoms and signs involving the musculoskeletal system: Secondary | ICD-10-CM

## 2015-11-08 DIAGNOSIS — R278 Other lack of coordination: Secondary | ICD-10-CM

## 2015-11-08 NOTE — Therapy (Signed)
[facility] [address] [place], [place], [address] Phone: [phone]   Fax:  [phone]  Physical Therapy Treatment  Patient Details  Name: Ashley [name] MRN: [id number] Date of Birth: [birth year] No Data Recorded  Encounter Date: 11/08/2015      PT End of Session - 11/08/15 1611    Visit Number 35   Number of Visits 32  Including add'l 8 visits through 12/13/15   Date for PT Re-Evaluation 12/13/15   Authorization Type BCBS. Per Laverda Page, visits now 30 for PT and OT total (15 per OT and 15 per PT). Pt wishes to continue at self pay after visit limit met.    Authorization - Visit Number 8   Authorization - Number of Visits 15   PT Start Time 1316   PT Stop Time 1400   PT Time Calculation (min) 44 min   Equipment Utilized During Treatment Gait belt;Other (comment)  BWSTT harness   Activity Tolerance Patient tolerated treatment well   Behavior During Therapy [facility] for tasks assessed/performed      Past Medical History  Diagnosis Date  . Anxiety   . ICH (intracerebral hemorrhage) ([place])   . Stroke [facility])     Past Surgical History  Procedure Laterality Date  . Dilation and curettage of uterus      There were no vitals filed for this visit.  Visit Diagnosis:  Abnormality of gait  Right leg weakness      Subjective Assessment - 11/08/15 1319    Subjective Pt denied falls or changes since last visit. Pt spoke to her PCP regarding emotions/behaviors and PCP referred her to a psychiatrist in order to manage medications.    Pertinent History Seizures, low BP   Patient Stated Goals "Be as close to normal as much as possible and to go back to running" Get back to taking care of my kids, 28 weeks old and 41 and 25 years old   Currently in Pain? No/denies                         [facility] Adult PT Treatment/Exercise - 11/08/15 1321    Ambulation/Gait   Ambulation/Gait Yes   Ambulation/Gait Assistance  5: Supervision;4: Min guard   Ambulation/Gait Assistance Details Pt performed BWSTT, 2 bouts of 8 minutes with one standing rest breaks. One bout with R AFO donned, speed at 1.44mh and one trial without AFO with speed at 1.0-1.232m. Tactile and VC's to improve R heel strike, improve stride length and to look straight ahead, and to improve R lat. wt. shifting without R AFO donned. Pt increased cycles/sec. to 0.51 with AFO and 0.52 cycle/sec without AFO.    Ambulation Distance (Feet) --  0.13 miles and 18443m BWSTT and 350' outdoors   Assistive device Straight cane;Body weight support system   Gait Pattern Decreased step length - right;Decreased stance time - right;Decreased weight shift to right;Right foot flat;Decreased hip/knee flexion - right;Step-through pattern   Ambulation Surface Level;Unlevel;Indoor                  PT Short Term Goals - 08/23/15 1203    PT SHORT TERM GOAL #1   Title Pt will be IND in HEP to improve strength, balance, and endurance. Target date: 07/27/15.   Status Partially Met   PT SHORT TERM GOAL #2   Title Pt will improve gait speed to >/=1.8ft70fc to decrease falls risk. Target date: 07/27/15.  Status Achieved   PT SHORT TERM GOAL #3   Title Pt will ambulate 300' with LRAD, over even terrain, at MOD I level to improve functional mobility. Target date: 07/27/15.   Status Achieved   PT SHORT TERM GOAL #4   Title Pt will perform TUG in </=13.5 seconds with LRAD to decr. falls risk. Target date: 07/27/15.   Status Partially Met   PT SHORT TERM GOAL #5   Title Perform BERG and write STG and LTG as appropriate. Target date: 07/27/15.   Status Achieved   PT SHORT TERM GOAL #6   Title Pt will improve BERG to >/=51/56 to decr. falls risk. Target date: 07/27/15.   Status Achieved           PT Long Term Goals - 11/08/15 1614    PT LONG TERM GOAL #1   Title Pt will ambulate 1000' over even/uneven terrain, with LRAD, at MOD I level to improve functional  mobiliity. Target date: 12/11/15.   Baseline deferred to new 4 week POC. All unmet goals will be carried over to new 4 week POC: 12/11/15   Status On-going   PT LONG TERM GOAL #2   Title Pt will amb. 300' over even terrain without an AD, IND, to safely amb. in home. Target date: 12/11/15   Status On-going   PT LONG TERM GOAL #3   Title Pt will improve gait speed to >/=2.48f/sec, with LRAD, to amb. safely in the community. Target date: 11/12/14.   Status Achieved   PT LONG TERM GOAL #4   Title Pt will verbalize plans to join a fitness center upon d/c from PT to continue to maintain strength and endurance gains made during PT. Target date: 12/11/15.   Status On-going   PT LONG TERM GOAL #5   Title Pt improve BERG score to >/=55/56 to decr. falls risk. Target date: 12/11/15   Status On-going   Additional Long Term Goals   Additional Long Term Goals Yes   PT LONG TERM GOAL #6   Title Pt will ascend/descend curb with LRAD at MOD I level to improve functional mobility. Target date: 12/11/15   Status New   PT LONG TERM GOAL #7   Title Pt will ascend/descend 8 steps with one handrail at MOD I level to improve functional mobility. Target date: 12/11/15   Status New               Plan - 11/08/15 1612    Clinical Impression Statement Pt demonstrated progress as she was able to improve stride length and R and L step length was within 1cm (52cm and 51cm) during BWSTT. Pt also able to tolerate incr. in gait speed on treadmill with and without AFO donned. Continue with POC.   Pt will benefit from skilled therapeutic intervention in order to improve on the following deficits Abnormal gait;Decreased endurance;Impaired sensation;Decreased knowledge of precautions;Decreased activity tolerance;Decreased knowledge of use of DME;Decreased strength;Impaired UE functional use;Impaired tone;Decreased balance;Decreased mobility;Decreased cognition;Decreased range of motion;Decreased safety awareness;Decreased  coordination;Impaired flexibility   Rehab Potential Good   Clinical Impairments Affecting Rehab Potential Seizures which may limit intensity of PT   PT Frequency 2x / week   PT Duration 4 weeks   PT Treatment/Interventions ADLs/Self Care Home Management;Neuromuscular re-education;Cognitive remediation;Biofeedback;DME Instruction;Gait training;Stair training;Canalith Repostioning;Patient/family education;Orthotic Fit/Training;Balance training;Therapeutic exercise;Manual techniques;Therapeutic activities;Vestibular   PT Next Visit Plan Curb and stair training.   PT Home Exercise Plan Stretching/strength/balance HEP   Consulted and Agree with Plan of Care Patient  Problem List Patient Active Problem List   Diagnosis Date Noted  . Spastic hemiplegia and hemiparesis affecting dominant side ([place]) 09/13/2015  . Adhesive capsulitis of right shoulder 08/24/2015  . Symptomatic partial epilepsy with simple partial seizures ([place]) 08/21/2015  . Nontraumatic cortical hemorrhage of cerebral hemisphere ([place])   . Seizure ([place])   . Seizures ([place])   . Adjustment disorder with depressed mood   . Contracture of muscle ankle and foot 06/11/2015  . Hemiplga fol ntrm intcrbl hemor aff right dominant side 06/06/2015  . Left-sided intracerebral hemorrhage ([place]) 06/04/2015  . Seizure disorder as sequela of cerebrovascular accident ([place])   . Seizure disorder ([place]) 06/03/2015  . Sepsis ([place]) 06/03/2015  . UTI (urinary tract infection) 06/03/2015  . Hypotension 06/02/2015  . Right spastic hemiparesis ([place]) 05/28/2015  . 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 History of anxiety disorder 05/28/2015  . ICH (intracerebral hemorrhage) ([place])   . Seizure disorder, nonconvulsive, with status epilepticus ([place])   . Cerebral venous thrombosis of cortical vein   . Cytotoxic cerebral edema ([place])   . IVH (intraventricular hemorrhage) ([place])   . Term pregnancy 05/07/2015  .  Spontaneous vaginal delivery 05/07/2015    Shemiah Rosch L 11/08/2015, 4:19 PM  [facility] [address] [place], [place], [address] Phone: [phone]   Fax:  [phone]  Name: Shereece Wellborn MRN: [id number] Date of Birth: Nov 16, 1975    Geoffry Paradise, PT,DPT 11/08/2015 4:19 PM Phone: [phone] Fax: [phone]

## 2015-11-09 NOTE — Therapy (Signed)
[facility] [address] [place], [place], [address] Phone: [phone]   Fax:  [phone]  Occupational Therapy Treatment  Patient Details  Name: Ashley [name] MRN: [id number] Date of Birth: April 06, 1976 No Data Recorded  Encounter Date: 11/08/2015      OT End of Session - 11/09/15 0832    Visit Number 8   Number of Visits 15   Date for OT Re-Evaluation 12/21/15   Authorization Type BCBS   Authorization Time Period 30 visit COMBINED between PT/OT   Authorization - Visit Number 8   Authorization - Number of Visits 15   OT Start Time 1405   OT Stop Time 1445   OT Time Calculation (min) 40 min   Activity Tolerance Patient tolerated treatment well   Behavior During Therapy [facility] for tasks assessed/performed      Past Medical History  Diagnosis Date  . Anxiety   . ICH (intracerebral hemorrhage) ([place])   . Stroke [facility])     Past Surgical History  Procedure Laterality Date  . Dilation and curettage of uterus      There were no vitals filed for this visit.  Visit Diagnosis:  Right spastic hemiparesis (HCC)  Pain in joint of right shoulder  Coordination impairment  Weakness due to cerebrovascular accident  Spasticity      Subjective Assessment - 11/08/15 1422    Pertinent History see epic snapshot; pt with SAH 2 weeks after deliverying her 3rd child   Patient Stated Goals to be able to use my R arm normally   Currently in Pain? No/denies     Treatment: supine closed chain shoulder flexion and upper trunk rotation towards right arm then away from right arm. Scapular mobilizatation in seated with mid range closed chain shoulder flexion, min facilitation Weightbearing through right elbow with lateral trunk flexion then reaching with LUE cross body, to decrease spasticity, min-mod facilitation,  At end of session pt demonstrated improved ability to reach behind for simulated  hygiene.                           OT Short Term Goals - 11/08/15 1419    OT SHORT TERM GOAL #6   Baseline met per pt report   Period Weeks   OT SHORT TERM GOAL #8   Title Pt will retrieve a lightweight object at 100 shoulder flexion with min compensations and pain no greater than 3/10. - 12/07/2015   Time 6   Period Weeks   Status On-going   OT SHORT TERM GOAL  #9   TITLE Pt will use RUE as an active assist for ADLs/ IADLS 70 % of the time.   Time 6   Period Weeks   Status On-going   OT SHORT TERM GOAL  #10   TITLE Pt will report that she is able to fasten her bra and perform toileting hygiene with greater ease.   Time 6   Period Weeks   Status On-going           OT Long Term Goals - 11/06/15 1700    OT LONG TERM GOAL #1   Baseline  difficulty with hygeine for toileting and fastening bra   Period Weeks   OT LONG TERM GOAL #2   Title revised long term goal- modified independent with cooking and home management with pt demonstrating good safety awareness - 01/18/2016   Time 12   Period Weeks   Status  On-going   OT LONG TERM GOAL #3   Baseline goal deferred due to shoulder pain   Period Weeks   OT LONG TERM GOAL #4   Title revised goal- Pt will use RUE as an active assist 75% of the time - see updated short and long term goals 01/18/2016   Baseline pt reports using 60% of the time as active assist   Time 12   Status On-going   OT LONG TERM GOAL #5   Title Pt will demonstrate ability to perform a physical and cognitive task simultaneously with 80% or better accuracy.   Baseline not fully addressed due to recent shoulder pain-    Status Deferred   OT LONG TERM GOAL #6   Baseline 25.47 secs   OT LONG TERM GOAL #7   Title Pt will report using RUE as an active assist 80% of the time for ADLs/ IADLS. 12/14/1960   Time 12   Period Weeks   Status On-going   OT LONG TERM GOAL #8   Title Pt will retrieve a light weight object at 110  shoulder flexion with  only min compensations.   Time 12   Period Weeks   Status On-going   OT LONG TERM GOAL  #9   Baseline I with updated HEP.   Time 12   Period Weeks   Status On-going               Plan - 11/09/15 0831    Clinical Impression Statement Pt is progressing towards goals with improving RUE shoulder flexion, and no significant pain.   Pt will benefit from skilled therapeutic intervention in order to improve on the following deficits (Retired) Abnormal gait;Decreased coordination;Decreased range of motion;Difficulty walking;Impaired flexibility;Decreased endurance;Decreased safety awareness;Increased edema;Impaired tone;Decreased knowledge of precautions;Decreased activity tolerance;Decreased balance;Decreased knowledge of use of DME;Pain;Impaired UE functional use;Impaired vision/preception;Decreased cognition;Decreased mobility;Decreased strength   OT Frequency --  15   OT Treatment/Interventions Self-care/ADL training;Therapeutic exercise;Patient/family education;Balance training;Ultrasound;Neuromuscular education;Manual Therapy;Splinting;Therapeutic exercises;Energy conservation;Parrafin;DME and/or AE instruction;Therapeutic activities;Cognitive remediation/compensation;Gait Training;Fluidtherapy;Electrical Stimulation;Moist Heat;Contrast Bath;Passive range of motion;Visual/perceptual remediation/compensation   Plan NMR mid to high reach, address ability to reach behind body        Problem List Patient Active Problem List   Diagnosis Date Noted  . Spastic hemiplegia and hemiparesis affecting dominant side ([place]) 09/13/2015  . Adhesive capsulitis of right shoulder 08/24/2015  . Symptomatic partial epilepsy with simple partial seizures ([place]) 08/21/2015  . Nontraumatic cortical hemorrhage of cerebral hemisphere ([place])   . Seizure ([place])   . Seizures ([place])   . Adjustment disorder with depressed mood   . Contracture of muscle ankle and foot 06/11/2015  . Hemiplga fol ntrm intcrbl hemor  aff right dominant side 06/06/2015  . Left-sided intracerebral hemorrhage ([place]) 06/04/2015  . Seizure disorder as sequela of cerebrovascular accident ([place])   . Seizure disorder ([place]) 06/03/2015  . Sepsis ([place]) 06/03/2015  . UTI (urinary tract infection) 06/03/2015  . Hypotension 06/02/2015  . Right spastic hemiparesis ([place]) 05/28/2015  . 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 History of anxiety disorder 05/28/2015  . ICH (intracerebral hemorrhage) ([place])   . Seizure disorder, nonconvulsive, with status epilepticus ([place])   . Cerebral venous thrombosis of cortical vein   . Cytotoxic cerebral edema ([place])   . IVH (intraventricular hemorrhage) ([place])   . Term pregnancy 05/07/2015  . Spontaneous vaginal delivery 05/07/2015    Brianne Maina 11/09/2015, 8:34 AM Theone Murdoch, OTR/L [phone] Phone: [phone] 8:34 AM 11/09/2015 [place] [address] [place], [place], [address] Phone: [phone]   Fax:  [phone]  Name: Ashley [name] MRN: [id number] Date of Birth: [birth year]

## 2015-11-13 ENCOUNTER — Ambulatory Visit: Payer: BLUE CROSS/BLUE SHIELD | Admitting: Occupational Therapy

## 2015-11-13 ENCOUNTER — Encounter: Payer: Self-pay | Admitting: Occupational Therapy

## 2015-11-13 ENCOUNTER — Ambulatory Visit: Payer: BLUE CROSS/BLUE SHIELD

## 2015-11-13 DIAGNOSIS — R269 Unspecified abnormalities of gait and mobility: Secondary | ICD-10-CM | POA: Diagnosis not present

## 2015-11-13 DIAGNOSIS — M25511 Pain in right shoulder: Secondary | ICD-10-CM

## 2015-11-13 DIAGNOSIS — R278 Other lack of coordination: Secondary | ICD-10-CM

## 2015-11-13 DIAGNOSIS — IMO0002 Reserved for concepts with insufficient information to code with codable children: Secondary | ICD-10-CM

## 2015-11-13 DIAGNOSIS — G8111 Spastic hemiplegia affecting right dominant side: Secondary | ICD-10-CM

## 2015-11-13 NOTE — Therapy (Signed)
[facility] [address] [place], [place], [address] Phone: [phone]   Fax:  [phone]  Occupational Therapy Treatment  Patient Details  Name: Ashley [name] MRN: [id number] Date of Birth: Apr 29, 1976 No Data Recorded  Encounter Date: 11/13/2015      OT End of Session - 11/13/15 1308    Visit Number 9   Number of Visits 15   Date for OT Re-Evaluation 12/21/15   Authorization Type BCBS   Authorization Time Period 30 visit COMBINED between PT/OT   Authorization - Visit Number 9   Authorization - Number of Visits 15   OT Start Time 0930   OT Stop Time 1015   OT Time Calculation (min) 45 min   Activity Tolerance Patient tolerated treatment well      Past Medical History  Diagnosis Date  . Anxiety   . ICH (intracerebral hemorrhage) ([place])   . Stroke [facility])     Past Surgical History  Procedure Laterality Date  . Dilation and curettage of uterus      There were no vitals filed for this visit.  Visit Diagnosis:  Right spastic hemiparesis ([place])  Pain in joint of right shoulder  Coordination impairment  Weakness due to cerebrovascular accident      Subjective Assessment - 11/13/15 0935    Subjective  I am really trying to use my R hand more but I forget.     Pertinent History see epic snapshot; pt with [facility] 2 weeks after deliverying her 3rd child   Patient Stated Goals to be able to use my R arm normally   Currently in Pain? No/denies                      OT Treatments/Exercises (OP) - 11/13/15 0001    Neurological Re-education Exercises   Other Exercises 1 Neuro re ed to address pt's ability to reach behind her back to aid in toilet hygiene - address trunk rotation as well more active reach in standing with RUE. Pt to practice at home. Also addressed increasing weight bearing and active trunk control in standing with emphasis on shifting to R  and using RUE and trunk to stabilize with  active stepping with LLE. Pt also given instruction on constraint induced technques and pt to practice this for 3 - 30 minute time frames at home as well for low to mid reach activities.    Manual Therapy   Manual Therapy Joint mobilization;Scapular mobilization   Manual therapy comments joint and scapular mob in sidelying to increase length/improve alignment and allow pt increased range of motion pain free for toilet hygiene.  Pt with some improvement in range and able to complete improved trunk rotation as well - pt to practice at home.                   OT Short Term Goals - 11/13/15 1305    OT SHORT TERM GOAL #6   Baseline met per pt report   Period Weeks   OT SHORT TERM GOAL #8   Title Pt will retrieve a lightweight object at 100 shoulder flexion with min compensations and pain no greater than 3/10. - 12/07/2015   Time 6   Period Weeks   Status On-going   OT SHORT TERM GOAL  #9   TITLE Pt will use RUE as an active assist for ADLs/ IADLS 70 % of the time.   Time 6   Period Weeks  Status On-going   OT SHORT TERM GOAL  #10   TITLE Pt will report that she is able to fasten her bra and perform toileting hygiene with greater ease.   Time 6   Period Weeks   Status On-going           OT Long Term Goals - 11/13/15 1305    OT LONG TERM GOAL #1   Baseline  difficulty with hygeine for toileting and fastening bra   Period Weeks   OT LONG TERM GOAL #2   Title revised long term goal- modified independent with cooking and home management with pt demonstrating good safety awareness - 01/18/2016   Time 12   Period Weeks   Status On-going   OT LONG TERM GOAL #3   Baseline goal deferred due to shoulder pain   Period Weeks   OT LONG TERM GOAL #4   Title revised goal- Pt will use RUE as an active assist 75% of the time - see updated short and long term goals 01/18/2016   Baseline pt reports using 60% of the time as active assist   Time 12   Status On-going   OT LONG TERM GOAL #5    Title Pt will demonstrate ability to perform a physical and cognitive task simultaneously with 80% or better accuracy.   Baseline not fully addressed due to recent shoulder pain-    Status Deferred   OT LONG TERM GOAL #6   Baseline 25.47 secs   OT LONG TERM GOAL #7   Title Pt will report using RUE as an active assist 80% of the time for ADLs/ IADLS. 5/0/3546   Time 12   Period Weeks   Status On-going   OT LONG TERM GOAL #8   Title Pt will retrieve a light weight object at 110  shoulder flexion with only min compensations.   Time 12   Period Weeks   Status On-going   OT LONG TERM GOAL  #9   Baseline I with updated HEP.   Time 12   Period Weeks   Status On-going               Plan - 11/13/15 1305    Clinical Impression Statement Pt progressing toward goals with improved AROM isolated movement in RUE and encouragement/techniques for constraint induced activiy at home.   Pt will benefit from skilled therapeutic intervention in order to improve on the following deficits (Retired) Abnormal gait;Decreased coordination;Decreased range of motion;Difficulty walking;Impaired flexibility;Decreased endurance;Decreased safety awareness;Increased edema;Impaired tone;Decreased knowledge of precautions;Decreased activity tolerance;Decreased balance;Decreased knowledge of use of DME;Pain;Impaired UE functional use;Impaired vision/preception;Decreased cognition;Decreased mobility;Decreased strength   Rehab Potential Good   OT Frequency Other (comment)   OT Duration 12 weeks   OT Treatment/Interventions Self-care/ADL training;Therapeutic exercise;Patient/family education;Balance training;Ultrasound;Neuromuscular education;Manual Therapy;Splinting;Therapeutic exercises;Energy conservation;Parrafin;DME and/or AE instruction;Therapeutic activities;Cognitive remediation/compensation;Gait Training;Fluidtherapy;Electrical Stimulation;Moist Heat;Contrast Bath;Passive range of motion;Visual/perceptual  remediation/compensation   Plan NMR mid to high reach, reaching behind body, scap mob and alignment, active trunk ,   Consulted and Agree with Plan of Care Patient        Problem List Patient Active Problem List   Diagnosis Date Noted  . Spastic hemiplegia and hemiparesis affecting dominant side ([place]) 09/13/2015  . Adhesive capsulitis of right shoulder 08/24/2015  . Symptomatic partial epilepsy with simple partial seizures ([place]) 08/21/2015  . Nontraumatic cortical hemorrhage of cerebral hemisphere ([place])   . Seizure ([place])   . Seizures ([place])   . Adjustment disorder with depressed mood   .  Contracture of muscle ankle and foot 06/11/2015  . Hemiplga fol ntrm intcrbl hemor aff right dominant side 06/06/2015  . Left-sided intracerebral hemorrhage ([place]) 06/04/2015  . Seizure disorder as sequela of cerebrovascular accident ([place])   . Seizure disorder ([place]) 06/03/2015  . Sepsis ([place]) 06/03/2015  . UTI (urinary tract infection) 06/03/2015  . Hypotension 06/02/2015  . Right spastic hemiparesis ([place]) 05/28/2015  . 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 History of anxiety disorder 05/28/2015  . ICH (intracerebral hemorrhage) ([place])   . Seizure disorder, nonconvulsive, with status epilepticus ([place])   . Cerebral venous thrombosis of cortical vein   . Cytotoxic cerebral edema ([place])   . IVH (intraventricular hemorrhage) ([place])   . Term pregnancy 05/07/2015  . Spontaneous vaginal delivery 05/07/2015    Forde Radon [facility] 11/13/2015, 1:09 PM  [facility] [address] [place], [place], [address] Phone: [phone]   Fax:  [phone]  Name: Ashley [name] MRN: [id number] Date of Birth: January 31, 1976

## 2015-11-13 NOTE — Therapy (Signed)
[facility] [address] [place], [place], [address] Phone: [phone]   Fax:  [phone]  Physical Therapy Treatment  Patient Details  Name: Ashley [name] MRN: [id number] Date of Birth: [birth year] No Data Recorded  Encounter Date: 11/13/2015      PT End of Session - 11/13/15 1531    Visit Number 36   Number of Visits 46   Date for PT Re-Evaluation 12/13/15   Authorization Type BCBS. Per Laverda Page, visits now 30 for PT and OT total (15 per OT and 15 per PT). Pt wishes to continue at self pay after visit limit met.    Authorization - Visit Number 9   Authorization - Number of Visits 15   PT Start Time 1017   PT Stop Time 1058   PT Time Calculation (min) 41 min   Equipment Utilized During Treatment Gait belt   Activity Tolerance Patient tolerated treatment well   Behavior During Therapy WFL for tasks assessed/performed      Past Medical History  Diagnosis Date  . Anxiety   . ICH (intracerebral hemorrhage) ([place])   . Stroke [facility])     Past Surgical History  Procedure Laterality Date  . Dilation and curettage of uterus      There were no vitals filed for this visit.  Visit Diagnosis:  Abnormality of gait      Subjective Assessment - 11/13/15 1020    Subjective Pt denied falls or changes since last visit. She reported feeling sad about her friend losing her baby. Pt brought in new medication: Fetzima ER '40mg'$ , one capsule po qd.    Pertinent History Seizures, low BP   Patient Stated Goals "Be as close to normal as much as possible and to go back to running" Get back to taking care of my kids, 36 weeks old and 66 and 48 years old   Currently in Pain? No/denies                         [facility] Adult PT Treatment/Exercise - 11/13/15 1024    Ambulation/Gait   Ambulation/Gait Yes   Ambulation/Gait Assistance 5: Supervision;4: Min guard;4: Min assist   Ambulation/Gait Assistance Details Pt amb.  with SPC placed in R hand to encourage wt. shifting onto R LE. Pt progressed from min A to min guard-S. Cues for sequencing and technique. Pt amb. at counter with L UE support and without UE support.   Ambulation Distance (Feet) --  20'x10, 230'x2, 75'x2   Assistive device Straight cane   Gait Pattern Decreased step length - right;Decreased stance time - right;Decreased weight shift to right;Right foot flat;Decreased hip/knee flexion - right;Step-through pattern   Ambulation Surface Level;Indoor   Stairs Yes   Stairs Assistance 4: Min guard   Stair Management Technique One rail Right;Step to pattern   Number of Stairs 12   Height of Stairs 6   Curb Other (comment);4: Min assist   Curb Details (indicate cue type and reason) min guard to ensure safety. Performed x3 reps, cues for sequencing.   Gait Comments Pt traversed stairs with cues for sequencing (up with L LE and down with R LE).                 PT Education - 11/13/15 1530    Education provided Yes   Education Details Discussed practicing gait with SPC in R hand to encourage wt. shifting onto R LE, but to only practice  at home at counter for safety.    Person(s) Educated Patient   Methods Explanation   Comprehension Verbalized understanding          PT Short Term Goals - 08/23/15 1203    PT SHORT TERM GOAL #1   Title Pt will be IND in HEP to improve strength, balance, and endurance. Target date: 07/27/15.   Status Partially Met   PT SHORT TERM GOAL #2   Title Pt will improve gait speed to >/=1.59f/sec to decrease falls risk. Target date: 07/27/15.   Status Achieved   PT SHORT TERM GOAL #3   Title Pt will ambulate 300' with LRAD, over even terrain, at MOD I level to improve functional mobility. Target date: 07/27/15.   Status Achieved   PT SHORT TERM GOAL #4   Title Pt will perform TUG in </=13.5 seconds with LRAD to decr. falls risk. Target date: 07/27/15.   Status Partially Met   PT SHORT TERM GOAL #5   Title  Perform BERG and write STG and LTG as appropriate. Target date: 07/27/15.   Status Achieved   PT SHORT TERM GOAL #6   Title Pt will improve BERG to >/=51/56 to decr. falls risk. Target date: 07/27/15.   Status Achieved           PT Long Term Goals - 11/08/15 1614    PT LONG TERM GOAL #1   Title Pt will ambulate 1000' over even/uneven terrain, with LRAD, at MOD I level to improve functional mobiliity. Target date: 12/11/15.   Baseline deferred to new 4 week POC. All unmet goals will be carried over to new 4 week POC: 12/11/15   Status On-going   PT LONG TERM GOAL #2   Title Pt will amb. 300' over even terrain without an AD, IND, to safely amb. in home. Target date: 12/11/15   Status On-going   PT LONG TERM GOAL #3   Title Pt will improve gait speed to >/=2.66fsec, with LRAD, to amb. safely in the community. Target date: 11/12/14.   Status Achieved   PT LONG TERM GOAL #4   Title Pt will verbalize plans to join a fitness center upon d/c from PT to continue to maintain strength and endurance gains made during PT. Target date: 12/11/15.   Status On-going   PT LONG TERM GOAL #5   Title Pt improve BERG score to >/=55/56 to decr. falls risk. Target date: 12/11/15   Status On-going   Additional Long Term Goals   Additional Long Term Goals Yes   PT LONG TERM GOAL #6   Title Pt will ascend/descend curb with LRAD at MOD I level to improve functional mobility. Target date: 12/11/15   Status New   PT LONG TERM GOAL #7   Title Pt will ascend/descend 8 steps with one handrail at MOD I level to improve functional mobility. Target date: 12/11/15   Status New               Plan - 11/13/15 1531    Clinical Impression Statement Pt demonstrated improved lateral wt. shifting onto R LE, with SPC placed in R hand. Pt able to traverse stairs and curb with min guard, with cues for sequencing but is still fearful of falling. Pt would continue to benefit from skilled PT to improve safety during  functional mobility.   Pt will benefit from skilled therapeutic intervention in order to improve on the following deficits Abnormal gait;Decreased endurance;Impaired sensation;Decreased knowledge of precautions;Decreased activity tolerance;Decreased knowledge  of use of DME;Decreased strength;Impaired UE functional use;Impaired tone;Decreased balance;Decreased mobility;Decreased cognition;Decreased range of motion;Decreased safety awareness;Decreased coordination;Impaired flexibility   Rehab Potential Good   Clinical Impairments Affecting Rehab Potential Seizures which may limit intensity of PT   PT Frequency 2x / week   PT Duration 4 weeks   PT Treatment/Interventions ADLs/Self Care Home Management;Neuromuscular re-education;Cognitive remediation;Biofeedback;DME Instruction;Gait training;Stair training;Canalith Repostioning;Patient/family education;Orthotic Fit/Training;Balance training;Therapeutic exercise;Manual techniques;Therapeutic activities;Vestibular   PT Next Visit Plan Use SOT to improve weight shifting by using visual cues on screen. Gait training with SPC in R hand to improve lateral wt. shifting onto R LE.   PT Home Exercise Plan Stretching/strength/balance HEP   Consulted and Agree with Plan of Care Patient        Problem List Patient Active Problem List   Diagnosis Date Noted  . Spastic hemiplegia and hemiparesis affecting dominant side ([place]) 09/13/2015  . Adhesive capsulitis of right shoulder 08/24/2015  . Symptomatic partial epilepsy with simple partial seizures ([place]) 08/21/2015  . Nontraumatic cortical hemorrhage of cerebral hemisphere ([place])   . Seizure ([employer])   . Seizures ([place])   . Adjustment disorder with depressed mood   . Contracture of muscle ankle and foot 06/11/2015  . Hemiplga fol ntrm intcrbl hemor aff right dominant side 06/06/2015  . Left-sided intracerebral hemorrhage ([place]) 06/04/2015  . Seizure disorder as sequela of cerebrovascular accident ([place])   .  Seizure disorder ([place]) 06/03/2015  . Sepsis ([place]) 06/03/2015  . UTI (urinary tract infection) 06/03/2015  . Hypotension 06/02/2015  . Right spastic hemiparesis ([place]) 05/28/2015  . 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 History of anxiety disorder 05/28/2015  . ICH (intracerebral hemorrhage) ([place])   . Seizure disorder, nonconvulsive, with status epilepticus ([place])   . Cerebral venous thrombosis of cortical vein   . Cytotoxic cerebral edema ([place])   . IVH (intraventricular hemorrhage) ([place])   . Term pregnancy 05/07/2015  . Spontaneous vaginal delivery 05/07/2015    Dresean Beckel L 11/13/2015, 3:34 PM  [facility] [address] [place], [place], [address] Phone: [phone]   Fax:  [phone]  Name: Winnona Wargo MRN: [id number] Date of Birth: 1976/01/06    Geoffry Paradise, PT,DPT 11/13/2015 3:34 PM Phone: [phone] Fax: [phone]

## 2015-11-15 ENCOUNTER — Encounter: Payer: Self-pay | Admitting: Occupational Therapy

## 2015-11-15 ENCOUNTER — Ambulatory Visit: Payer: BLUE CROSS/BLUE SHIELD | Admitting: Physical Therapy

## 2015-11-15 ENCOUNTER — Ambulatory Visit: Payer: BLUE CROSS/BLUE SHIELD | Attending: Physical Medicine & Rehabilitation | Admitting: Occupational Therapy

## 2015-11-15 DIAGNOSIS — M25611 Stiffness of right shoulder, not elsewhere classified: Secondary | ICD-10-CM | POA: Insufficient documentation

## 2015-11-15 DIAGNOSIS — R531 Weakness: Secondary | ICD-10-CM | POA: Diagnosis present

## 2015-11-15 DIAGNOSIS — R2991 Unspecified symptoms and signs involving the musculoskeletal system: Secondary | ICD-10-CM | POA: Insufficient documentation

## 2015-11-15 DIAGNOSIS — R278 Other lack of coordination: Secondary | ICD-10-CM | POA: Insufficient documentation

## 2015-11-15 DIAGNOSIS — R269 Unspecified abnormalities of gait and mobility: Secondary | ICD-10-CM | POA: Insufficient documentation

## 2015-11-15 DIAGNOSIS — I69898 Other sequelae of other cerebrovascular disease: Secondary | ICD-10-CM | POA: Insufficient documentation

## 2015-11-15 DIAGNOSIS — M25511 Pain in right shoulder: Secondary | ICD-10-CM | POA: Diagnosis present

## 2015-11-15 DIAGNOSIS — R279 Unspecified lack of coordination: Secondary | ICD-10-CM | POA: Diagnosis present

## 2015-11-15 DIAGNOSIS — G8111 Spastic hemiplegia affecting right dominant side: Secondary | ICD-10-CM | POA: Insufficient documentation

## 2015-11-15 DIAGNOSIS — R29898 Other symptoms and signs involving the musculoskeletal system: Secondary | ICD-10-CM | POA: Diagnosis present

## 2015-11-15 DIAGNOSIS — IMO0002 Reserved for concepts with insufficient information to code with codable children: Secondary | ICD-10-CM

## 2015-11-15 NOTE — Therapy (Signed)
[facility] [address] [place], [place], [address] Phone: [phone]   Fax:  [phone]  Occupational Therapy Treatment  Patient Details  Name: Ashley [name] MRN: [id number] Date of Birth: 19-Nov-1975 No Data Recorded  Encounter Date: 11/15/2015      OT End of Session - 11/15/15 1223    Visit Number 10   Number of Visits 15   Date for OT Re-Evaluation 12/21/15   Authorization Type BCBS   Authorization Time Period 30 visit COMBINED between PT/OT   Authorization - Visit Number 10   Authorization - Number of Visits 15   OT Start Time [phone]   OT Stop Time 1016   OT Time Calculation (min) 45 min   Activity Tolerance Patient tolerated treatment well      Past Medical History  Diagnosis Date  . Anxiety   . ICH (intracerebral hemorrhage) (HCC)   . Stroke [facility])     Past Surgical History  Procedure Laterality Date  . Dilation and curettage of uterus      There were no vitals filed for this visit.  Visit Diagnosis:  Right spastic hemiparesis (HCC)  Pain in joint of right shoulder  Coordination impairment  Weakness due to cerebrovascular accident      Subjective Assessment - 11/15/15 1006    Subjective  I used my right hand alot more (pt using limited constraint induced approach at home)   Pertinent History see epic snapshot; pt with [facility] 2 weeks after deliverying her 3rd child   Patient Stated Goals to be able to use my R arm normally   Currently in Pain? No/denies                      OT Treatments/Exercises (OP) - 11/15/15 0001    Neurological Re-education Exercises   Other Exercises 1 Neuro re ed with emphasis on functional ambulation to improve trunk control, increased weight bearing on RLE to allow for more nornalized tone in RUE as well as more isolated movement in RUE in standing and walking.  Pt requires mod facilication however is able to improve within single treatment session.   Pt with decreased compensations when able to achieve midline during functional mobility.              Balance Exercises - 11/15/15 0821    Balance Exercises: Standing   Balance Master: Limits for Stability Targets center/R lateral (LOS 25% then 30%); pacing 8 seconds, then 10 seconds; fixed support, fixed surround 2 x 2-minute trials. Cueing for initiating weight shifting at hips then following with head/shoulders; cueing for avoiding compensation via trunk rotation to R side. Targets center to right/anterior, 25% LOS, 8-sec pacing with 2 minute timer with cueing for R pelvic protraction during R anterior weight shift (to address R pelvic retraction during gait). Progressed to R stagger step (to simulate R weight acceptance during gait) with center to R/anterior target with 40% LOS and 8-sec pacing. Activities focused on facilitating weight shifting, R stance stability to promote more normalized and stable gait pattern.             OT Short Term Goals - 11/15/15 1218    OT SHORT TERM GOAL #6   Baseline met per pt report   Period Weeks   OT SHORT TERM GOAL #8   Title Pt will retrieve a lightweight object at 100 shoulder flexion with min compensations and pain no greater than 3/10. - 11/23/2015 (4 weeks from  recertification)   Time 6   Period Weeks   Status On-going   OT SHORT TERM GOAL  #9   TITLE Pt will use RUE as an active assist for ADLs/ IADLS 70 % of the time.   Time 6   Period Weeks   Status Achieved   OT SHORT TERM GOAL  #10   TITLE Pt will report that she is able to fasten her bra and perform toileting hygiene with greater ease.   Time 6   Period Weeks   Status On-going           OT Long Term Goals - 11/15/15 1219    OT LONG TERM GOAL #1   Baseline  difficulty with hygeine for toileting and fastening bra   Period Weeks   OT LONG TERM GOAL #2   Title revised long term goal- modified independent with cooking and home management with pt demonstrating good safety  awareness - 12/21/2015 (8 weeks from recertification)   Time 12   Period Weeks   Status On-going   OT LONG TERM GOAL #3   Baseline goal deferred due to shoulder pain   Period Weeks   OT LONG TERM GOAL #4   Title revised goal- Pt will use RUE as an active assist 75% of the time - see updated short and long term goals 01/18/2016   Baseline pt reports using 60% of the time as active assist   Time 12   Status On-going   OT LONG TERM GOAL #5   Title Pt will demonstrate ability to perform a physical and cognitive task simultaneously with 80% or better accuracy.   Baseline not fully addressed due to recent shoulder pain-    Status Deferred   OT LONG TERM GOAL #6   Baseline 25.47 secs   OT LONG TERM GOAL #7   Title Pt will report using RUE as an active assist 80% of the time for ADLs/ IADLS. 06/13/6944   Time 12   Period Weeks   Status On-going   OT LONG TERM GOAL #8   Title Pt will retrieve a light weight object at 110  shoulder flexion with only min compensations.   Time 12   Period Weeks   Status On-going   OT LONG TERM GOAL  #9   Baseline I with updated HEP.   Time 12   Period Weeks   Status On-going               Plan - 11/15/15 1220    Clinical Impression Statement Pt progressing toward goals. Pt with improved ability to use R hand for functional tasks at home using modified constraint induced approach.    Pt will benefit from skilled therapeutic intervention in order to improve on the following deficits (Retired) Abnormal gait;Decreased coordination;Decreased range of motion;Difficulty walking;Impaired flexibility;Decreased endurance;Decreased safety awareness;Increased edema;Impaired tone;Decreased knowledge of precautions;Decreased activity tolerance;Decreased balance;Decreased knowledge of use of DME;Pain;Impaired UE functional use;Impaired vision/preception;Decreased cognition;Decreased mobility;Decreased strength   Rehab Potential Good   OT Frequency Other (comment)    OT Duration 8 weeks   OT Treatment/Interventions Self-care/ADL training;Therapeutic exercise;Patient/family education;Balance training;Ultrasound;Neuromuscular education;Manual Therapy;Splinting;Therapeutic exercises;Energy conservation;Parrafin;DME and/or AE instruction;Therapeutic activities;Cognitive remediation/compensation;Gait Training;Fluidtherapy;Electrical Stimulation;Moist Heat;Contrast Bath;Passive range of motion;Visual/perceptual remediation/compensation   Plan NMR for mid to hgh reach, functional ambulation with emphasis on trunk control and RUE in standing and walking, reaching behind body, scap mob and alignment,    Consulted and Agree with Plan of Care Patient  Problem List Patient Active Problem List   Diagnosis Date Noted  . Spastic hemiplegia and hemiparesis affecting dominant side ([place]) 09/13/2015  . Adhesive capsulitis of right shoulder 08/24/2015  . Symptomatic partial epilepsy with simple partial seizures ([place]) 08/21/2015  . Nontraumatic cortical hemorrhage of cerebral hemisphere ([place])   . Seizure ([place])   . Seizures ([place])   . Adjustment disorder with depressed mood   . Contracture of muscle ankle and foot 06/11/2015  . Hemiplga fol ntrm intcrbl hemor aff right dominant side 06/06/2015  . Left-sided intracerebral hemorrhage ([place]) 06/04/2015  . Seizure disorder as sequela of cerebrovascular accident ([place])   . Seizure disorder ([place]) 06/03/2015  . Sepsis ([place]) 06/03/2015  . UTI (urinary tract infection) 06/03/2015  . Hypotension 06/02/2015  . Right spastic hemiparesis ([place]) 05/28/2015  . 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 History of anxiety disorder 05/28/2015  . ICH (intracerebral hemorrhage) ([place])   . Seizure disorder, nonconvulsive, with status epilepticus ([place])   . Cerebral venous thrombosis of cortical vein   . Cytotoxic cerebral edema ([place])   . IVH (intraventricular hemorrhage) ([place])   . Term pregnancy 05/07/2015   . Spontaneous vaginal delivery 05/07/2015    Quay Burow, OTR/L 11/15/2015, 12:24 PM  [name] [address] [place], [place], [address] Phone: [phone]   Fax:  [phone]  Name: Ashley [name] MRN: [id number] Date of Birth: Jan 03, 1976

## 2015-11-15 NOTE — Therapy (Signed)
[facility] [address] [place], [place], [address] Phone: [phone]   Fax:  [phone]  Physical Therapy Treatment  Patient Details  Name: Ashley [name] MRN: [id number] Date of Birth: August 12, 1976 No Data Recorded  Encounter Date: 11/15/2015      PT End of Session - 11/15/15 1111    Visit Number 37   Number of Visits 29   Date for PT Re-Evaluation 12/13/15   Authorization Type BCBS. Per Laverda Page, visits now 30 for PT and OT total (15 per OT and 15 per PT). Pt wishes to continue at self pay after visit limit met.    Authorization - Visit Number 10   Authorization - Number of Visits 15   PT Start Time [phone]  Pt arrived late to session   PT Stop Time 0848   PT Time Calculation (min) 40 min   Equipment Utilized During Treatment Gait belt;Other (comment)  Balance Master and harness   Activity Tolerance Patient tolerated treatment well   Behavior During Therapy [facility] for tasks assessed/performed      Past Medical History  Diagnosis Date  . Anxiety   . ICH (intracerebral hemorrhage) ([place])   . Stroke [facility])     Past Surgical History  Procedure Laterality Date  . Dilation and curettage of uterus      There were no vitals filed for this visit.  Visit Diagnosis:  Abnormality of gait  Right spastic hemiparesis ([place])      Subjective Assessment - 11/15/15 0809    Subjective "Is my walking ever going to get better?"   Pt reports no signficant changes, no falls.    Patient is accompained by: Family member   Pertinent History Seizures, low BP   Patient Stated Goals "Be as close to normal as much as possible and to go back to running" Get back to taking care of my kids, 36 weeks old and 31 and 23 years old   Currently in Pain? No/denies                         [facility] Adult PT Treatment/Exercise - 11/15/15 0001    Ambulation/Gait   Ambulation/Gait Yes  gait training performed after Balance Master  activities   Ambulation/Gait Assistance 5: Supervision   Ambulation/Gait Assistance Details Multimodal cueing (verbal and demo) focused on anterior weight shift as well as lateral weight shift to R during LLE advancement. Verbal, tactile cueing at R ASIS provided to promote R pelvic protraction during R stance phase of gait. Most carryover noted when pt performing at parallel bars using mirror for visual feedback.    Ambulation Distance (Feet) 420 Feet   Assistive device Straight cane;None   Gait Pattern Decreased stance time - right;Decreased weight shift to right;Right foot flat;Decreased hip/knee flexion - right;Step-through pattern;Decreased arm swing - right;Decreased step length - left;Trunk rotated posteriorly on right   Ambulation Surface Level;Indoor   Gait Comments Performed gait x230' with SPC in L hand with effective carryover of more normalized R anterolateral weight shift with 25% cueing; however, upon placing SPC in R hand, noted increased difficulty sequencing gait with SPC (may be due to anxiety, difficulty motor planning vs. incoordination). Performed final gait trial x115' with SPC in R hand, min guard-minA, and mod cueing for 3-point gait pattern. Gait x75' without AD with min A with cueing for decreased RLE step length and increase LLE step length for improved symmetry of weight shifting.  Balance Exercises - 11/15/15 0821    Balance Exercises: Standing   Balance Master: Limits for Stability Targets center/R lateral (LOS 25% then 30%); pacing 8 seconds, then 10 seconds; fixed support, fixed surround 2 x 2-minute trials. Cueing for initiating weight shifting at hips then following with head/shoulders; cueing for avoiding compensation via trunk rotation to R side. Targets center to right/anterior, 25% LOS, 8-sec pacing with 2 minute timer with cueing for R pelvic protraction during R anterior weight shift (to address R pelvic retraction during gait). Progressed to R  stagger step (to simulate R weight acceptance during gait) with center to R/anterior target with 40% LOS and 8-sec pacing. Activities focused on facilitating weight shifting, R stance stability to promote more normalized and stable gait pattern.           PT Education - 11/15/15 1001    Education provided Yes   Education Details Importance of shifting weight to R and forward when taking L step.   Person(s) Educated Patient   Methods Explanation;Demonstration;Verbal cues;Tactile cues   Comprehension Verbalized understanding;Returned demonstration;Tactile cues required          PT Short Term Goals - 08/23/15 1203    PT SHORT TERM GOAL #1   Title Pt will be IND in HEP to improve strength, balance, and endurance. Target date: 07/27/15.   Status Partially Met   PT SHORT TERM GOAL #2   Title Pt will improve gait speed to >/=1.38f/sec to decrease falls risk. Target date: 07/27/15.   Status Achieved   PT SHORT TERM GOAL #3   Title Pt will ambulate 300' with LRAD, over even terrain, at MOD I level to improve functional mobility. Target date: 07/27/15.   Status Achieved   PT SHORT TERM GOAL #4   Title Pt will perform TUG in </=13.5 seconds with LRAD to decr. falls risk. Target date: 07/27/15.   Status Partially Met   PT SHORT TERM GOAL #5   Title Perform BERG and write STG and LTG as appropriate. Target date: 07/27/15.   Status Achieved   PT SHORT TERM GOAL #6   Title Pt will improve BERG to >/=51/56 to decr. falls risk. Target date: 07/27/15.   Status Achieved           PT Long Term Goals - 11/08/15 1614    PT LONG TERM GOAL #1   Title Pt will ambulate 1000' over even/uneven terrain, with LRAD, at MOD I level to improve functional mobiliity. Target date: 12/11/15.   Baseline deferred to new 4 week POC. All unmet goals will be carried over to new 4 week POC: 12/11/15   Status On-going   PT LONG TERM GOAL #2   Title Pt will amb. 300' over even terrain without an AD, IND, to safely  amb. in home. Target date: 12/11/15   Status On-going   PT LONG TERM GOAL #3   Title Pt will improve gait speed to >/=2.677fsec, with LRAD, to amb. safely in the community. Target date: 11/12/14.   Status Achieved   PT LONG TERM GOAL #4   Title Pt will verbalize plans to join a fitness center upon d/c from PT to continue to maintain strength and endurance gains made during PT. Target date: 12/11/15.   Status On-going   PT LONG TERM GOAL #5   Title Pt improve BERG score to >/=55/56 to decr. falls risk. Target date: 12/11/15   Status On-going   Additional Long Term Goals   Additional Long Term Goals  Yes   PT LONG TERM GOAL #6   Title Pt will ascend/descend curb with LRAD at MOD I level to improve functional mobility. Target date: 12/11/15   Status New   PT LONG TERM GOAL #7   Title Pt will ascend/descend 8 steps with one handrail at MOD I level to improve functional mobility. Target date: 12/11/15   Status New               Plan - 11/15/15 1230    Clinical Impression Statement Session focused on increasing R lateral/anterior weight shift during R stance phase of gait; and promoting R stance stability (emphasis on R pelvic protraction). Pt responded favorably to visual feedback, as indicated by inrceased R lateral/anterior weight shifting with use of Balance Master Limits of Stability and mirror for visual feedback. Pt will require reinforcement for effective carryover.   Pt will benefit from skilled therapeutic intervention in order to improve on the following deficits Abnormal gait;Decreased endurance;Impaired sensation;Decreased knowledge of precautions;Decreased activity tolerance;Decreased knowledge of use of DME;Decreased strength;Impaired UE functional use;Impaired tone;Decreased balance;Decreased mobility;Decreased cognition;Decreased range of motion;Decreased safety awareness;Decreased coordination;Impaired flexibility   Rehab Potential Good   Clinical Impairments Affecting Rehab  Potential Seizures which may limit intensity of PT   PT Frequency 2x / week   PT Duration 4 weeks   PT Treatment/Interventions ADLs/Self Care Home Management;Neuromuscular re-education;Cognitive remediation;Biofeedback;DME Instruction;Gait training;Stair training;Canalith Repostioning;Patient/family education;Orthotic Fit/Training;Balance training;Therapeutic exercise;Manual techniques;Therapeutic activities;Vestibular   PT Next Visit Plan Continue SOT to improve weight shifting by using visual cues on screen. Gait training with SPC in R hand to improve lateral wt. shifting onto R LE. Gait with mirror for visual feedback.    PT Home Exercise Plan Stretching/strength/balance HEP   Consulted and Agree with Plan of Care Patient        Problem List Patient Active Problem List   Diagnosis Date Noted  . Spastic hemiplegia and hemiparesis affecting dominant side ([place]) 09/13/2015  . Adhesive capsulitis of right shoulder 08/24/2015  . Symptomatic partial epilepsy with simple partial seizures ([place]) 08/21/2015  . Nontraumatic cortical hemorrhage of cerebral hemisphere ([place])   . Seizure ([place])   . Seizures ([place])   . Adjustment disorder with depressed mood   . Contracture of muscle ankle and foot 06/11/2015  . Hemiplga fol ntrm intcrbl hemor aff right dominant side 06/06/2015  . Left-sided intracerebral hemorrhage ([place]) 06/04/2015  . Seizure disorder as sequela of cerebrovascular accident ([place])   . Seizure disorder ([place]) 06/03/2015  . Sepsis ([place]) 06/03/2015  . UTI (urinary tract infection) 06/03/2015  . Hypotension 06/02/2015  . Right spastic hemiparesis ([place]) 05/28/2015  . 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 History of anxiety disorder 05/28/2015  . ICH (intracerebral hemorrhage) ([place])   . Seizure disorder, nonconvulsive, with status epilepticus ([place])   . Cerebral venous thrombosis of cortical vein   . Cytotoxic cerebral edema ([place])   . IVH (intraventricular  hemorrhage) ([place])   . Term pregnancy 05/07/2015  . Spontaneous vaginal delivery 05/07/2015    Billie Ruddy, PT, DPT [facility] [address] [place], [place], [address] Phone: [phone]   Fax:  [phone] 11/15/2015, 12:35 PM   Name: Ellyce Lafevers MRN: [id number] Date of Birth: 04-11-1976

## 2015-11-20 ENCOUNTER — Ambulatory Visit: Payer: BLUE CROSS/BLUE SHIELD

## 2015-11-20 ENCOUNTER — Ambulatory Visit: Payer: BLUE CROSS/BLUE SHIELD | Admitting: Occupational Therapy

## 2015-11-20 ENCOUNTER — Encounter: Payer: Self-pay | Admitting: Occupational Therapy

## 2015-11-20 DIAGNOSIS — R278 Other lack of coordination: Secondary | ICD-10-CM

## 2015-11-20 DIAGNOSIS — M25511 Pain in right shoulder: Secondary | ICD-10-CM

## 2015-11-20 DIAGNOSIS — IMO0002 Reserved for concepts with insufficient information to code with codable children: Secondary | ICD-10-CM

## 2015-11-20 DIAGNOSIS — R269 Unspecified abnormalities of gait and mobility: Secondary | ICD-10-CM

## 2015-11-20 DIAGNOSIS — G8111 Spastic hemiplegia affecting right dominant side: Secondary | ICD-10-CM | POA: Diagnosis not present

## 2015-11-20 NOTE — Therapy (Signed)
[facility] [address] [place], [place], [address] Phone: [phone]   Fax:  [phone]  Occupational Therapy Treatment  Patient Details  Name: Ashley [name] MRN: [id number] Date of Birth: 1976-10-10 No Data Recorded  Encounter Date: 11/20/2015      OT End of Session - 11/20/15 1646    Visit Number 11   Date for OT Re-Evaluation 12/21/15   Authorization Type BCBS   Authorization Time Period 30 visit COMBINED between PT/OT   Authorization - Visit Number 11   Authorization - Number of Visits 15   OT Start Time 1016   OT Stop Time 1100   OT Time Calculation (min) 44 min   Activity Tolerance Patient tolerated treatment well      Past Medical History  Diagnosis Date  . Anxiety   . ICH (intracerebral hemorrhage) ([place])   . Stroke [facility])     Past Surgical History  Procedure Laterality Date  . Dilation and curettage of uterus      There were no vitals filed for this visit.  Visit Diagnosis:  Right spastic hemiparesis (HCC)  Pain in joint of right shoulder  Coordination impairment  Weakness due to cerebrovascular accident      Subjective Assessment - 11/20/15 1020    Subjective  I guess I will use the walker if you really want me to - I don't really like it but I get why you want me to use i.    Pertinent History see epic snapshot; pt with SAH 2 weeks after deliverying her 3rd child   Patient Stated Goals to be able to use my R arm normally   Currently in Pain? No/denies                      OT Treatments/Exercises (OP) - 11/20/15 0001    Neurological Re-education Exercises   Other Exercises 1 Neuro re ed in kneeling, high kneeling and standing to addres trunk control, weight shifting and activiation of RUE, trunk and RLE, alignment, stability as well as stable mobility of R side; also addressed in standing to improve trunk control and shoulder girdle alignemnt for RUE functional use.                   OT Short Term Goals - 11/20/15 1313    OT SHORT TERM GOAL #6   Baseline --   Period Weeks   OT SHORT TERM GOAL #8   Title Pt will retrieve a lightweight object at 100 shoulder flexion with min compensations and pain no greater than 3/10. - 11/23/2015 (4 weeks from recertification)   Time --   Period --   Status Achieved   OT SHORT TERM GOAL  #9   TITLE Pt will use RUE as an active assist for ADLs/ IADLS 70 % of the time.   Time --   Period --   Status Achieved   OT SHORT TERM GOAL  #10   TITLE Pt will report that she is able to fasten her bra and perform toileting hygiene with greater ease.   Time --   Period --   Status Partially Met  toilet hygiene easier           OT Long Term Goals - 11/20/15 1315    OT LONG TERM GOAL #1   Baseline --   Period Weeks   OT LONG TERM GOAL #2   Title revised long term goal- modified independent with  cooking and home management with pt demonstrating good safety awareness - 12/21/2015 (8 weeks from recertification)   Time --   Period --   Status On-going   OT LONG TERM GOAL #3   Baseline --   Period --   OT LONG TERM GOAL #4   Title revised goal- Pt will use RUE as an active assist 75% of the time - see updated short and long term goals 01/18/2016   Baseline pt reports using 60% of the time as active assist   Time --   Status On-going   OT LONG TERM GOAL #5   Title Pt will demonstrate ability to perform a physical and cognitive task simultaneously with 80% or better accuracy.   Baseline --   Status Deferred   OT LONG TERM GOAL #6   Baseline --   OT LONG TERM GOAL #7   Title Pt will report using RUE as an active assist 80% of the time for ADLs/ IADLS. 12/21/15   Time --   Period --   Status On-going   OT LONG TERM GOAL #8   Title Pt will retrieve a light weight object at 110  shoulder flexion with only min compensations.   Time --   Period --   Status On-going   OT LONG TERM GOAL  #9   Baseline I with  updated HEP.   Time --   Period --   Status On-going               Plan - 11/20/15 1642    Clinical Impression Statement Pt making good progress with trunk control, alignment, increased use of R side and increased in weigh shifting to the right.   Pt will benefit from skilled therapeutic intervention in order to improve on the following deficits (Retired) Abnormal gait;Decreased coordination;Decreased range of motion;Difficulty walking;Impaired flexibility;Decreased endurance;Decreased safety awareness;Increased edema;Impaired tone;Decreased knowledge of precautions;Decreased activity tolerance;Decreased balance;Decreased knowledge of use of DME;Pain;Impaired UE functional use;Impaired vision/preception;Decreased cognition;Decreased mobility;Decreased strength   Rehab Potential Good   Clinical Impairments Affecting Rehab Potential cognition, impaired sensation and anxiety   OT Frequency 2x / week   OT Duration 8 weeks   OT Treatment/Interventions Self-care/ADL training;Therapeutic exercise;Patient/family education;Balance training;Ultrasound;Neuromuscular education;Manual Therapy;Splinting;Therapeutic exercises;Energy conservation;Parrafin;DME and/or AE instruction;Therapeutic activities;Cognitive remediation/compensation;Gait Training;Fluidtherapy;Electrical Stimulation;Moist Heat;Contrast Bath;Passive range of motion;Visual/perceptual remediation/compensation   Plan NMR for mid to high reach, functional ambulation with emphasis on trunk control and RUE in standing, walking, kneeling and half kneeling, reaching behind body, scap mob and alignment   Consulted and Agree with Plan of Care Patient        Problem List Patient Active Problem List   Diagnosis Date Noted  . Spastic hemiplegia and hemiparesis affecting dominant side (HCC) 09/13/2015  . Adhesive capsulitis of right shoulder 08/24/2015  . Symptomatic partial epilepsy with simple partial seizures (HCC) 08/21/2015  .  Nontraumatic cortical hemorrhage of cerebral hemisphere (HCC)   . Seizure (HCC)   . Seizures (HCC)   . Adjustment disorder with depressed mood   . Contracture of muscle ankle and foot 06/11/2015  . Hemiplga fol ntrm intcrbl hemor aff right dominant side 06/06/2015  . Left-sided intracerebral hemorrhage (HCC) 06/04/2015  . Seizure disorder as sequela of cerebrovascular accident (HCC)   . Seizure disorder (HCC) 06/03/2015  . Sepsis (HCC) 06/03/2015  . UTI (urinary tract infection) 06/03/2015  . Hypotension 06/02/2015  . Right spastic hemiparesis (HCC) 05/28/2015  . 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 History of anxiety disorder 05/28/2015  .   ICH (intracerebral hemorrhage) ([place])   . Seizure disorder, nonconvulsive, with status epilepticus ([place])   . Cerebral venous thrombosis of cortical vein   . Cytotoxic cerebral edema ([place])   . IVH (intraventricular hemorrhage) ([place])   . Term pregnancy 05/07/2015  . Spontaneous vaginal delivery 05/07/2015    Quay Burow, OTR/L 11/20/2015, 4:48 PM  [facility] [address] [place], [place], [address] Phone: [phone]   Fax:  [phone]  Name: Cashmere Dingley MRN: [id number] Date of Birth: 08-Sep-1976

## 2015-11-20 NOTE — Therapy (Signed)
[facility] [address] [place], [place], [address] Phone: [phone]   Fax:  [phone]  Physical Therapy Treatment  Patient Details  Name: Ashley [name] MRN: [id number] Date of Birth: 1976-03-24 No Data Recorded  Encounter Date: 11/20/2015      PT End of Session - 11/20/15 1314    Visit Number 38   Number of Visits 47   Date for PT Re-Evaluation 12/13/15   Authorization Type BCBS. Per Conan Bowens, visits now 30 for PT and OT total (15 per OT and 15 per PT). Pt wishes to continue at self pay after visit limit met.    Authorization - Visit Number 11   Authorization - Number of Visits 15   PT Start Time 0932   PT Stop Time 1015   PT Time Calculation (min) 43 min   Equipment Utilized During Treatment --  none, but min guard to S   Activity Tolerance Patient tolerated treatment well   Behavior During Therapy [facility] for tasks assessed/performed      Past Medical History  Diagnosis Date  . Anxiety   . ICH (intracerebral hemorrhage) (HCC)   . Stroke [facility])     Past Surgical History  Procedure Laterality Date  . Dilation and curettage of uterus      There were no vitals filed for this visit.  Visit Diagnosis:  Abnormality of gait      Subjective Assessment - 11/20/15 0934    Subjective Pt reported she is going to see a new psychiatrist today. Pt denied falls since last visit.    Pertinent History Seizures, low BP   Patient Stated Goals "Be as close to normal as much as possible and to go back to running" Get back to taking care of my kids, 28 weeks old and 34 and 43 years old   Currently in Pain? No/denies                         [facility] Adult PT Treatment/Exercise - 11/20/15 0935    Ambulation/Gait   Ambulation/Gait Yes   Ambulation/Gait Assistance 5: Supervision;4: Min guard   Ambulation/Gait Assistance Details PT and OT co-treated with pt using SPC and RW, with R AFO donned. Tactile and VC's  to improve  lateral wt. shifting onto R LE and to improve stride length. PT utilized video feedback for pt to view, pt verbalized approval for OT to use video footage during PT/OT training sessions.    Ambulation Distance (Feet) --  117'x1 with UEs placed on OT,117' and 40x6 RW,75', 40x4' SPC   Assistive device Straight cane;None;Rolling walker   Gait Pattern Decreased stance time - right;Decreased weight shift to right;Right foot flat;Decreased hip/knee flexion - right;Step-through pattern;Decreased arm swing - right;Decreased step length - left;Trunk rotated posteriorly on right   Ambulation Surface Level;Indoor                PT Education - 11/20/15 1312    Education provided Yes   Education Details PT and OT educated pt on the importance of using RW (from neuro) to improve LE gait pattern, improve lateral wt. shifting onto R LE, and to decrease R UE flexion during gait.    Person(s) Educated Patient  pt's mom   Methods Explanation   Comprehension Verbalized understanding;Returned demonstration          PT Short Term Goals - 08/23/15 1203    PT SHORT TERM GOAL #1   Title  Pt will be IND in HEP to improve strength, balance, and endurance. Target date: 07/27/15.   Status Partially Met   PT SHORT TERM GOAL #2   Title Pt will improve gait speed to >/=1.57f/sec to decrease falls risk. Target date: 07/27/15.   Status Achieved   PT SHORT TERM GOAL #3   Title Pt will ambulate 300' with LRAD, over even terrain, at MOD I level to improve functional mobility. Target date: 07/27/15.   Status Achieved   PT SHORT TERM GOAL #4   Title Pt will perform TUG in </=13.5 seconds with LRAD to decr. falls risk. Target date: 07/27/15.   Status Partially Met   PT SHORT TERM GOAL #5   Title Perform BERG and write STG and LTG as appropriate. Target date: 07/27/15.   Status Achieved   PT SHORT TERM GOAL #6   Title Pt will improve BERG to >/=51/56 to decr. falls risk. Target date: 07/27/15.    Status Achieved           PT Long Term Goals - 11/08/15 1614    PT LONG TERM GOAL #1   Title Pt will ambulate 1000' over even/uneven terrain, with LRAD, at MOD I level to improve functional mobiliity. Target date: 12/11/15.   Baseline deferred to new 4 week POC. All unmet goals will be carried over to new 4 week POC: 12/11/15   Status On-going   PT LONG TERM GOAL #2   Title Pt will amb. 300' over even terrain without an AD, IND, to safely amb. in home. Target date: 12/11/15   Status On-going   PT LONG TERM GOAL #3   Title Pt will improve gait speed to >/=2.627fsec, with LRAD, to amb. safely in the community. Target date: 11/12/14.   Status Achieved   PT LONG TERM GOAL #4   Title Pt will verbalize plans to join a fitness center upon d/c from PT to continue to maintain strength and endurance gains made during PT. Target date: 12/11/15.   Status On-going   PT LONG TERM GOAL #5   Title Pt improve BERG score to >/=55/56 to decr. falls risk. Target date: 12/11/15   Status On-going   Additional Long Term Goals   Additional Long Term Goals Yes   PT LONG TERM GOAL #6   Title Pt will ascend/descend curb with LRAD at MOD I level to improve functional mobility. Target date: 12/11/15   Status New   PT LONG TERM GOAL #7   Title Pt will ascend/descend 8 steps with one handrail at MOD I level to improve functional mobility. Target date: 12/11/15   Status New               Plan - 11/20/15 1314    Clinical Impression Statement PT and OT co-treated pt during PT session today, to encourage use of temporary RW (provided from OPPT neuro), in order to decrease gait deviations, improve lateral wt. shifting onto R LE during stance, and decr. R UE flexion during gait. Continue with POC.    Pt will benefit from skilled therapeutic intervention in order to improve on the following deficits Abnormal gait;Decreased endurance;Impaired sensation;Decreased knowledge of precautions;Decreased activity  tolerance;Decreased knowledge of use of DME;Decreased strength;Impaired UE functional use;Impaired tone;Decreased balance;Decreased mobility;Decreased cognition;Decreased range of motion;Decreased safety awareness;Decreased coordination;Impaired flexibility   Rehab Potential Good   Clinical Impairments Affecting Rehab Potential Seizures which may limit intensity of PT   PT Frequency 2x / week   PT Duration 4 weeks  PT Treatment/Interventions ADLs/Self Care Home Management;Neuromuscular re-education;Cognitive remediation;Biofeedback;DME Instruction;Gait training;Stair training;Canalith Repostioning;Patient/family education;Orthotic Fit/Training;Balance training;Therapeutic exercise;Manual techniques;Therapeutic activities;Vestibular   PT Next Visit Plan Gait training with RW and SPC, SOT with visual cues on screen.   PT Home Exercise Plan Stretching/strength/balance HEP   Consulted and Agree with Plan of Care Patient   Family Member Consulted pt's mom        Problem List Patient Active Problem List   Diagnosis Date Noted  . Spastic hemiplegia and hemiparesis affecting dominant side ([place]) 09/13/2015  . Adhesive capsulitis of right shoulder 08/24/2015  . Symptomatic partial epilepsy with simple partial seizures ([place]) 08/21/2015  . Nontraumatic cortical hemorrhage of cerebral hemisphere ([place])   . Seizure ([place])   . Seizures ([place])   . Adjustment disorder with depressed mood   . Contracture of muscle ankle and foot 06/11/2015  . Hemiplga fol ntrm intcrbl hemor aff right dominant side 06/06/2015  . Left-sided intracerebral hemorrhage ([place]) 06/04/2015  . Seizure disorder as sequela of cerebrovascular accident ([place])   . Seizure disorder ([place]) 06/03/2015  . Sepsis ([place]) 06/03/2015  . UTI (urinary tract infection) 06/03/2015  . Hypotension 06/02/2015  . Right spastic hemiparesis ([place]) 05/28/2015  . 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 History of anxiety  disorder 05/28/2015  . ICH (intracerebral hemorrhage) ([place])   . Seizure disorder, nonconvulsive, with status epilepticus ([place])   . Cerebral venous thrombosis of cortical vein   . Cytotoxic cerebral edema ([place])   . IVH (intraventricular hemorrhage) ([place])   . Term pregnancy 05/07/2015  . Spontaneous vaginal delivery 05/07/2015    Miller,Jennifer L 11/20/2015, 1:17 PM  [facility] [address] [place], [place], [address] Phone: [phone]   Fax:  [phone]  Name: Ashley [name] MRN: [id number] Date of Birth: 14-Apr-1976    Geoffry Paradise, PT,DPT 11/20/2015 1:17 PM Phone: [phone] Fax: [phone]

## 2015-11-22 ENCOUNTER — Ambulatory Visit: Payer: BLUE CROSS/BLUE SHIELD

## 2015-11-22 ENCOUNTER — Ambulatory Visit: Payer: BLUE CROSS/BLUE SHIELD | Admitting: Occupational Therapy

## 2015-11-22 DIAGNOSIS — G8111 Spastic hemiplegia affecting right dominant side: Secondary | ICD-10-CM | POA: Diagnosis not present

## 2015-11-22 DIAGNOSIS — M25511 Pain in right shoulder: Secondary | ICD-10-CM

## 2015-11-22 DIAGNOSIS — IMO0002 Reserved for concepts with insufficient information to code with codable children: Secondary | ICD-10-CM

## 2015-11-22 DIAGNOSIS — R269 Unspecified abnormalities of gait and mobility: Secondary | ICD-10-CM

## 2015-11-22 DIAGNOSIS — R278 Other lack of coordination: Secondary | ICD-10-CM

## 2015-11-22 NOTE — Therapy (Signed)
[facility] [address] [place], [place], [address] Phone: [phone]   Fax:  [phone]  Occupational Therapy Treatment  Patient Details  Name: Ashley [name] MRN: [id number] Date of Birth: December 06, 1975 No Data Recorded  Encounter Date: 11/22/2015      OT End of Session - 11/22/15 1236    Visit Number 12   Number of Visits 15   Date for OT Re-Evaluation 12/21/15   Authorization Type BCBS   Authorization Time Period 30 visit COMBINED between PT/OT   Authorization - Visit Number 12   Authorization - Number of Visits 15   OT Start Time 0930   OT Stop Time 1015   OT Time Calculation (min) 45 min   Activity Tolerance Patient tolerated treatment well      Past Medical History  Diagnosis Date  . Anxiety   . ICH (intracerebral hemorrhage) ([place])   . Stroke [facility])     Past Surgical History  Procedure Laterality Date  . Dilation and curettage of uterus      There were no vitals filed for this visit.  Visit Diagnosis:  Right spastic hemiparesis (HCC)  Pain in joint of right shoulder  Coordination impairment  Weakness due to cerebrovascular accident      Subjective Assessment - 11/22/15 1228    Subjective  They changed my meds for depression (pt now taking Trintellix, 10 mg QD)   Pertinent History see epic snapshot; pt with SAH 2 weeks after deliverying her 3rd child   Patient Stated Goals to be able to use my R arm normally   Currently in Pain? No/denies                      OT Treatments/Exercises (OP) - 11/22/15 0001    Neurological Re-education Exercises   Other Exercises 1 Neuro re ed to address trunk control, increased activation on R side, improved alignment, ability to weight shift onto aligned RLE in kneeling, half kneeling and standing.  Pt with increasing ability to shift to the R despite significant sensory deficts and fear.  Progressd to overhead reach in sitting and pt able to  reach to 105* with no compensations and ease.                   OT Short Term Goals - 11/22/15 1232    OT SHORT TERM GOAL #6   Period Weeks   OT SHORT TERM GOAL #8   Title Pt will retrieve a lightweight object at 100 shoulder flexion with min compensations and pain no greater than 3/10. - 11/23/2015 (4 weeks from recertification)   Status Achieved   OT SHORT TERM GOAL  #9   TITLE Pt will use RUE as an active assist for ADLs/ IADLS 70 % of the time.   Status Achieved   OT SHORT TERM GOAL  #10   TITLE Pt will report that she is able to fasten her bra and perform toileting hygiene with greater ease.   Status Partially Met  toilet hygiene easier           OT Long Term Goals - 11/22/15 1232    OT LONG TERM GOAL #1   Period Weeks   OT LONG TERM GOAL #2   Title revised long term goal- modified independent with cooking and home management with pt demonstrating good safety awareness - 12/21/2015 (8 weeks from recertification)   Status On-going   OT LONG TERM GOAL #4  Title revised goal- Pt will use RUE as an active assist 75% of the time - see updated short and long term goals 12/21/2015   Baseline pt reports using 60% of the time as active assist   Status On-going   OT LONG TERM GOAL #5   Title Pt will demonstrate ability to perform a physical and cognitive task simultaneously with 80% or better accuracy.   Status Deferred   OT LONG TERM GOAL #7   Title Pt will report using RUE as an active assist 80% of the time for ADLs/ IADLS. 12/21/15   Status On-going   OT LONG TERM GOAL #8   Title Pt will retrieve a light weight object at 110  shoulder flexion with only min compensations.   Status On-going   OT LONG TERM GOAL  #9   Baseline I with updated HEP.   Status On-going               Plan - 11/22/15 1234    Clinical Impression Statement Pt making excellent progress toward goals with improving alignment, increased willingess and ability to weight bear and weight  shift to R and increaesd ease in use of RUE.   Pt will benefit from skilled therapeutic intervention in order to improve on the following deficits (Retired) Abnormal gait;Decreased coordination;Decreased range of motion;Difficulty walking;Impaired flexibility;Decreased endurance;Decreased safety awareness;Increased edema;Impaired tone;Decreased knowledge of precautions;Decreased activity tolerance;Decreased balance;Decreased knowledge of use of DME;Pain;Impaired UE functional use;Impaired vision/preception;Decreased cognition;Decreased mobility;Decreased strength   Rehab Potential Good   Clinical Impairments Affecting Rehab Potential cognition, impaired sensation and anxiety   OT Frequency 2x / week   OT Duration 8 weeks   OT Treatment/Interventions Self-care/ADL training;Therapeutic exercise;Patient/family education;Balance training;Ultrasound;Neuromuscular education;Manual Therapy;Splinting;Therapeutic exercises;Energy conservation;Parrafin;DME and/or AE instruction;Therapeutic activities;Cognitive remediation/compensation;Gait Training;Fluidtherapy;Electrical Stimulation;Moist Heat;Contrast Bath;Passive range of motion;Visual/perceptual remediation/compensation   Plan NMR for mid to high reach, functional mobility with emphasis on trunk control, alignment, reach in standing weight shift to R   Consulted and Agree with Plan of Care Patient        Problem List Patient Active Problem List   Diagnosis Date Noted  . Spastic hemiplegia and hemiparesis affecting dominant side ([place]) 09/13/2015  . Adhesive capsulitis of right shoulder 08/24/2015  . Symptomatic partial epilepsy with simple partial seizures ([place]) 08/21/2015  . Nontraumatic cortical hemorrhage of cerebral hemisphere ([place])   . Seizure ([place])   . Seizures ([place])   . Adjustment disorder with depressed mood   . Contracture of muscle ankle and foot 06/11/2015  . Hemiplga fol ntrm intcrbl hemor aff right dominant side 06/06/2015  .  Left-sided intracerebral hemorrhage ([place]) 06/04/2015  . Seizure disorder as sequela of cerebrovascular accident ([place])   . Seizure disorder ([place]) 06/03/2015  . Sepsis ([place]) 06/03/2015  . UTI (urinary tract infection) 06/03/2015  . Hypotension 06/02/2015  . Right spastic hemiparesis ([place]) 05/28/2015  . 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 History of anxiety disorder 05/28/2015  . ICH (intracerebral hemorrhage) ([place])   . Seizure disorder, nonconvulsive, with status epilepticus ([place])   . Cerebral venous thrombosis of cortical vein   . Cytotoxic cerebral edema ([place])   . IVH (intraventricular hemorrhage) ([place])   . Term pregnancy 05/07/2015  . Spontaneous vaginal delivery 05/07/2015    Quay Burow, OTR/L 11/22/2015, 12:38 PM  [facility] [address] [place], [place], [address] Phone: [phone]   Fax:  [phone]  Name: Dorinne Graeff MRN: [id number] Date of Birth: 06-Jan-1976

## 2015-11-22 NOTE — Therapy (Signed)
[facility] [address] [place], [place], [address] Phone: [phone]   Fax:  [phone]  Physical Therapy Treatment  Patient Details  Name: Ashley [name] MRN: [id number] Date of Birth: [birth year] No Data Recorded  Encounter Date: 11/22/2015      PT End of Session - 11/22/15 1123    Visit Number 39   Number of Visits 61   Date for PT Re-Evaluation 12/13/15   Authorization Type BCBS. Per Laverda Page, visits now 30 for PT and OT total (15 per OT and 15 per PT). Pt wishes to continue at self pay after visit limit met.    Authorization - Visit Number 12   Authorization - Number of Visits 15   PT Start Time 2774   PT Stop Time 1100   PT Time Calculation (min) 42 min   Equipment Utilized During Treatment Gait belt;Other (comment)  SOT harness   Activity Tolerance Patient tolerated treatment well   Behavior During Therapy [facility] for tasks assessed/performed      Past Medical History  Diagnosis Date  . Anxiety   . ICH (intracerebral hemorrhage) ([place])   . Stroke [facility])     Past Surgical History  Procedure Laterality Date  . Dilation and curettage of uterus      There were no vitals filed for this visit.  Visit Diagnosis:  Abnormality of gait  Coordination impairment      Subjective Assessment - 11/22/15 1022    Subjective Pt reported she ceased abilify and started Trintellix 55m qd. Pt denied falls since last visit. Pt saw the NP for medication and will see a therapist on Monday.    Pertinent History Seizures, low BP   Patient Stated Goals "Be as close to normal as much as possible and to go back to running" Get back to taking care of my kids, 729weeks old and 679and 847years old   Currently in Pain? No/denies                         [facility] - [facility]15 1028    Ambulation/Gait   Ambulation/Gait Yes   Ambulation/Gait Assistance 5: Supervision   Ambulation/Gait  Assistance Details Tactile and VC's to improve wt. shifting onto R LE and to protract R hip during terminal stance phase. Cues to decr. Speed in order to decr. Gait deviations. PT had pt take standing breaks in order to discuss proper technique.   Ambulation Distance (Feet) --  117'x4, 20'x2   Assistive device Rolling walker   Gait Pattern Decreased stance time - right;Decreased weight shift to right;Right foot flat;Decreased hip/knee flexion - right;Step-through pattern;Decreased arm swing - right;Decreased step length - left;Trunk rotated posteriorly on right   Ambulation Surface Level;Indoor      Neuro re-ed: BPension scheme managerwith harness and min guard to S for safety: pt performed ant/post/lat wt. Shifting activities in training mode with screen on for visual cues. Pt performed in 1-2 minute bouts x3/activity, at 25-50% length from center starting point. Tactile, verbal and manual cues for technique, improve R lat/post wt. Shifting and to decr. Trunk flexion. PT also demonstrated proper technique.            PT Short Term Goals - 08/23/15 1203    PT SHORT TERM GOAL #1   Title Pt will be IND in HEP to improve strength, balance, and endurance. Target date: 07/27/15.   Status Partially Met  PT SHORT TERM GOAL #2   Title Pt will improve gait speed to >/=1.2f/sec to decrease falls risk. Target date: 07/27/15.   Status Achieved   PT SHORT TERM GOAL #3   Title Pt will ambulate 300' with LRAD, over even terrain, at MOD I level to improve functional mobility. Target date: 07/27/15.   Status Achieved   PT SHORT TERM GOAL #4   Title Pt will perform TUG in </=13.5 seconds with LRAD to decr. falls risk. Target date: 07/27/15.   Status Partially Met   PT SHORT TERM GOAL #5   Title Perform BERG and write STG and LTG as appropriate. Target date: 07/27/15.   Status Achieved   PT SHORT TERM GOAL #6   Title Pt will improve BERG to >/=51/56 to decr. falls risk. Target date: 07/27/15.   Status  Achieved           PT Long Term Goals - 11/08/15 1614    PT LONG TERM GOAL #1   Title Pt will ambulate 1000' over even/uneven terrain, with LRAD, at MOD I level to improve functional mobiliity. Target date: 12/11/15.   Baseline deferred to new 4 week POC. All unmet goals will be carried over to new 4 week POC: 12/11/15   Status On-going   PT LONG TERM GOAL #2   Title Pt will amb. 300' over even terrain without an AD, IND, to safely amb. in home. Target date: 12/11/15   Status On-going   PT LONG TERM GOAL #3   Title Pt will improve gait speed to >/=2.630fsec, with LRAD, to amb. safely in the community. Target date: 11/12/14.   Status Achieved   PT LONG TERM GOAL #4   Title Pt will verbalize plans to join a fitness center upon d/c from PT to continue to maintain strength and endurance gains made during PT. Target date: 12/11/15.   Status On-going   PT LONG TERM GOAL #5   Title Pt improve BERG score to >/=55/56 to decr. falls risk. Target date: 12/11/15   Status On-going   Additional Long Term Goals   Additional Long Term Goals Yes   PT LONG TERM GOAL #6   Title Pt will ascend/descend curb with LRAD at MOD I level to improve functional mobility. Target date: 12/11/15   Status New   PT LONG TERM GOAL #7   Title Pt will ascend/descend 8 steps with one handrail at MOD I level to improve functional mobility. Target date: 12/11/15   Status New               Plan - 11/22/15 1123    Clinical Impression Statement Pt continues to require tactile cues and manual facilitation to improve wt. shifting onto R LE and to protract R hip during gait and wt. shifting activities. Pt would continue to benefit from skilled PT to improve safety during functional mobilty.   Pt will benefit from skilled therapeutic intervention in order to improve on the following deficits Abnormal gait;Decreased endurance;Impaired sensation;Decreased knowledge of precautions;Decreased activity tolerance;Decreased knowledge  of use of DME;Decreased strength;Impaired UE functional use;Impaired tone;Decreased balance;Decreased mobility;Decreased cognition;Decreased range of motion;Decreased safety awareness;Decreased coordination;Impaired flexibility   Rehab Potential Good   Clinical Impairments Affecting Rehab Potential Seizures which may limit intensity of PT   PT Frequency 2x / week   PT Duration 4 weeks   PT Treatment/Interventions ADLs/Self Care Home Management;Neuromuscular re-education;Cognitive remediation;Biofeedback;DME Instruction;Gait training;Stair training;Canalith Repostioning;Patient/family education;Orthotic Fit/Training;Balance training;Therapeutic exercise;Manual techniques;Therapeutic activities;Vestibular   PT Next Visit Plan Gait  training with SPC, SOT with visual cues on screen (posterior and R lateral directions)   PT Home Exercise Plan Stretching/strength/balance HEP   Consulted and Agree with Plan of Care Patient        Problem List Patient Active Problem List   Diagnosis Date Noted  . Spastic hemiplegia and hemiparesis affecting dominant side ([place]) 09/13/2015  . Adhesive capsulitis of right shoulder 08/24/2015  . Symptomatic partial epilepsy with simple partial seizures ([place]) 08/21/2015  . Nontraumatic cortical hemorrhage of cerebral hemisphere ([place])   . Seizure ([place])   . Seizures ([place])   . Adjustment disorder with depressed mood   . Contracture of muscle ankle and foot 06/11/2015  . Hemiplga fol ntrm intcrbl hemor aff right dominant side 06/06/2015  . Left-sided intracerebral hemorrhage ([place]) 06/04/2015  . Seizure disorder as sequela of cerebrovascular accident ([place])   . Seizure disorder ([place]) 06/03/2015  . Sepsis ([place]) 06/03/2015  . UTI (urinary tract infection) 06/03/2015  . Hypotension 06/02/2015  . Right spastic hemiparesis ([place]) 05/28/2015  . 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 History of anxiety disorder 05/28/2015  . ICH (intracerebral  hemorrhage) ([place])   . Seizure disorder, nonconvulsive, with status epilepticus ([place])   . Cerebral venous thrombosis of cortical vein   . Cytotoxic cerebral edema ([place])   . IVH (intraventricular hemorrhage) ([place])   . Term pregnancy 05/07/2015  . Spontaneous vaginal delivery 05/07/2015    , L 11/22/2015, 11:26 AM  [facility] [address] [place], [place], [address] Phone: [phone]   Fax:  [phone]  Name: Avleen Bordwell MRN: [id number] Date of Birth: April 01, 1976    Geoffry Paradise, PT,DPT 11/22/2015 11:26 AM Phone: [phone] Fax: [phone]

## 2015-11-26 ENCOUNTER — Encounter: Payer: Self-pay | Admitting: Occupational Therapy

## 2015-11-26 ENCOUNTER — Ambulatory Visit (INDEPENDENT_AMBULATORY_CARE_PROVIDER_SITE_OTHER): Payer: BLUE CROSS/BLUE SHIELD | Admitting: Neurology

## 2015-11-26 ENCOUNTER — Ambulatory Visit: Payer: BLUE CROSS/BLUE SHIELD | Admitting: Occupational Therapy

## 2015-11-26 ENCOUNTER — Encounter: Payer: Self-pay | Admitting: Neurology

## 2015-11-26 VITALS — BP 93/59 | HR 70 | Ht 63.0 in | Wt 134.8 lb

## 2015-11-26 DIAGNOSIS — M25511 Pain in right shoulder: Secondary | ICD-10-CM

## 2015-11-26 DIAGNOSIS — IMO0002 Reserved for concepts with insufficient information to code with codable children: Secondary | ICD-10-CM

## 2015-11-26 DIAGNOSIS — G8111 Spastic hemiplegia affecting right dominant side: Secondary | ICD-10-CM | POA: Diagnosis not present

## 2015-11-26 DIAGNOSIS — I611 Nontraumatic intracerebral hemorrhage in hemisphere, cortical: Secondary | ICD-10-CM

## 2015-11-26 DIAGNOSIS — R278 Other lack of coordination: Secondary | ICD-10-CM

## 2015-11-26 NOTE — Therapy (Signed)
[facility] [address] [place], [place], [address] Phone: [phone]   Fax:  [phone]  Occupational Therapy Treatment  Patient Details  Name: Ashley [name] MRN: [id number] Date of Birth: August 19, 1976 No Data Recorded  Encounter Date: 11/26/2015      OT End of Session - 11/26/15 1736    Visit Number 13   Number of Visits 15   Date for OT Re-Evaluation 12/21/15   Authorization Type BCBS   Authorization Time Period 30 visit COMBINED between PT/OT   Authorization - Visit Number 37   Authorization - Number of Visits 15   OT Start Time 1446   OT Stop Time 1531   OT Time Calculation (min) 45 min   Activity Tolerance Patient tolerated treatment well      Past Medical History  Diagnosis Date  . Anxiety   . ICH (intracerebral hemorrhage) ([place])   . Stroke ([place])   . Seizures [facility])     Past Surgical History  Procedure Laterality Date  . Dilation and curettage of uterus      There were no vitals filed for this visit.  Visit Diagnosis:  Right spastic hemiparesis ([facility])  Pain in joint of right shoulder  Weakness due to cerebrovascular accident  Coordination impairment      Subjective Assessment - 11/26/15 1454    Subjective  I woke up in the middle of the night and my shoulder was hurting   Pertinent History see epic snapshot; pt with SAH 2 weeks after deliverying her 3rd child   Patient Stated Goals to be able to use my R arm normally   Currently in Pain? Yes   Pain Score 3    Pain Location Shoulder   Pain Orientation Right   Pain Descriptors / Indicators Sore   Pain Type Chronic pain   Pain Onset More than a month ago   Pain Frequency Intermittent   Aggravating Factors  when I raise my arm   Pain Relieving Factors avoiding certain movements   Effect of Pain on Daily Activities limits use                      OT Treatments/Exercises (OP) - 11/26/15 0001    Neurological Re-education  Exercises   Other Exercises 1 Neuro re ed to address closed chain overhead reach then progressing to closed chain unilateral overhead reach with UBE with intermittent resistance in standing. Pt tolerated well. Emphasis on weight shifting to R in standing to fully activate R side prior to overhead reach.   Manual Therapy   Manual Therapy Joint mobilization;Soft tissue mobilization;Scapular mobilization   Manual therapy comments Joint and scap mob as well as soft tissue mob to decrease pain and stiffness prior to neuro re ed.  Pt with 4/10 pain initally however no pain after treatment. Followed by neuro re ed                  OT Short Term Goals - 11/26/15 1734    OT SHORT TERM GOAL #6   Period Weeks   OT SHORT TERM GOAL #8   Title Pt will retrieve a lightweight object at 100 shoulder flexion with min compensations and pain no greater than 3/10. - 11/23/2015 (4 weeks from recertification)   Status Achieved   OT SHORT TERM GOAL  #9   TITLE Pt will use RUE as an active assist for ADLs/ IADLS 70 % of the time.   Status Achieved  OT SHORT TERM GOAL  #10   TITLE Pt will report that she is able to fasten her bra and perform toileting hygiene with greater ease.   Status Partially Met  toilet hygiene easier           OT Long Term Goals - 11/26/15 1734    OT LONG TERM GOAL #1   Period Weeks   OT LONG TERM GOAL #2   Title revised long term goal- modified independent with cooking and home management with pt demonstrating good safety awareness - 12/21/2015 (8 weeks from recertification)   Status On-going   OT LONG TERM GOAL #4   Title revised goal- Pt will use RUE as an active assist 75% of the time - see updated short and long term goals 12/21/2015   Baseline pt reports using 60% of the time as active assist   Status On-going   OT LONG TERM GOAL #5   Title Pt will demonstrate ability to perform a physical and cognitive task simultaneously with 80% or better accuracy.   Status  Deferred   OT LONG TERM GOAL #7   Title Pt will report using RUE as an active assist 80% of the time for ADLs/ IADLS. 12/21/15   Status On-going   OT LONG TERM GOAL #8   Title Pt will retrieve a light weight object at 110  shoulder flexion with only min compensations.   Status On-going   OT LONG TERM GOAL  #9   Baseline I with updated HEP.   Status On-going               Plan - 11/26/15 1734    Clinical Impression Statement Pt making good progress toward goals with improved overhead reach in closed chain activities as well as improving trunk control in standing and funcitonal ambulation.   Pt will benefit from skilled therapeutic intervention in order to improve on the following deficits (Retired) Abnormal gait;Decreased coordination;Decreased range of motion;Difficulty walking;Impaired flexibility;Decreased endurance;Decreased safety awareness;Increased edema;Impaired tone;Decreased knowledge of precautions;Decreased activity tolerance;Decreased balance;Decreased knowledge of use of DME;Pain;Impaired UE functional use;Impaired vision/preception;Decreased cognition;Decreased mobility;Decreased strength   Rehab Potential Good   Clinical Impairments Affecting Rehab Potential cognition, impaired sensation and anxiety   OT Frequency 2x / week   OT Duration 8 weeks   OT Treatment/Interventions Self-care/ADL training;Therapeutic exercise;Patient/family education;Balance training;Ultrasound;Neuromuscular education;Manual Therapy;Splinting;Therapeutic exercises;Energy conservation;Parrafin;DME and/or AE instruction;Therapeutic activities;Cognitive remediation/compensation;Gait Training;Fluidtherapy;Electrical Stimulation;Moist Heat;Contrast Bath;Passive range of motion;Visual/perceptual remediation/compensation   Plan NMR for mid to high reach, funcitonal mobility with emphasis on trunk control, alignment, reach in standing weight shfit to R   Consulted and Agree with Plan of Care Patient         Problem List Patient Active Problem List   Diagnosis Date Noted  . Spastic hemiplegia and hemiparesis affecting dominant side ([place]) 09/13/2015  . Adhesive capsulitis of right shoulder 08/24/2015  . Symptomatic partial epilepsy with simple partial seizures ([place]) 08/21/2015  . Nontraumatic cortical hemorrhage of cerebral hemisphere ([place])   . Seizure ([place])   . Seizures ([place])   . Adjustment disorder with depressed mood   . Contracture of muscle ankle and foot 06/11/2015  . Hemiplga fol ntrm intcrbl hemor aff right dominant side 06/06/2015  . Left-sided intracerebral hemorrhage ([place]) 06/04/2015  . Seizure disorder as sequela of cerebrovascular accident ([place])   . Seizure disorder ([place]) 06/03/2015  . Sepsis ([place]) 06/03/2015  . UTI (urinary tract infection) 06/03/2015  . Hypotension 06/02/2015  . Right spastic hemiparesis ([place]) 05/28/2015  . Aphasia  following nontraumatic intracerebral hemorrhage 05/28/2015  . History of anxiety disorder 05/28/2015  . ICH (intracerebral hemorrhage) ([place])   . Seizure disorder, nonconvulsive, with status epilepticus ([place])   . Cerebral venous thrombosis of cortical vein   . Cytotoxic cerebral edema ([place])   . IVH (intraventricular hemorrhage) ([place])   . Term pregnancy 05/07/2015  . Spontaneous vaginal delivery 05/07/2015    Quay Burow, OTR/L 11/26/2015, 5:37 PM  [facility] [address] [place], [place], [address] Phone: [phone]   Fax:  [phone]  Name: Ashley [name] MRN: [id number] Date of Birth: Oct 22, 1975

## 2015-11-26 NOTE — Patient Instructions (Addendum)
I had a long discussion with the patient and husband regarding her a remote intracerebral hemorrhage and residual mild right-sided spastic hemiparesis. She is doing well without recurrent seizures and improvement in her headaches. I recommend she taper the Topamax to 50 mg daily for one week and then discontinue it. Continue Keppra 1500 mg twice daily for seizures. I recommend she start taking aspirin 81 mg daily. Continue follow-up with Dr. Charleen Kirks for Botox injections for right leg spasticity. Patient may drive now as she has been seizure-free for 6 months but I advised her to be careful and take it slowly. She will return for follow-up in 6 months or call earlier if necessary.

## 2015-11-26 NOTE — Progress Notes (Signed)
[facility] [address] [place]. [place] [address] [phone]       OFFICE FOLLOW-UP NOTE  [name] Date of Birth:  [birth year] Medical Record Number:  [id number]   HPI: 70 year Caucasian lady seen today for first office follow-up visit for in-hospital admission for intracerebral hemorrhage in August 2016.Ashley [name] is an 40 y.o. female admitted on 05/21/15 for severe HA for several days and developed right hemiparesis and aphasia as well as right focal seizure. She was 2 weeks post partum at that time. She was found to have large l 8 x 4 x 5 cm eft parietal ICH with IVH, cerebral edema, obstructive hydrocephalus and SAH. MRA and MRV were unremarkable. Concern was for cerebral cortical vein thrombosis not seen on MRV, however, RVCS was also on differential. She was treated with 3% saline and was stabilized over time. She also had a seizure, EEG showed left PLEDs, was put on Keppra for seizure control. Hypercoagulable labs were normal She was then discharged to CIR after stabilization.where , patient had seizure episode again, Keppra was increased to 1500 twice a day. Since then patient had no more seizure activity. She is working with PT OT and speech, had made great progress. She is able to walk with cane and making sentences and was scheduled to discharge next Wednesday. However, as per therapist, her language seems getting worse over the last 2 days, with more stuttering, and words finding difficulties, especially when patient more anxious and eager to speak. No decline of any arm leg weakness, no seizure activity. Neurology was consulted for further evaluation on 06/22/15. Repeat MRI scan of the brain showed resolving subacute left parietal hematoma but also showed  Acute gyral swelling and T2 hyperintensity involving the left hippocampal formation, left insula, and operculum raising possible postictal effect. Patient has since done well without recurrent seizures. She is  presently living at home with her husband. She is getting outpatient therapy. She is walking with an AFO but still has right foot drop and right foot is quite stiff. She's also noticed increased depression and sleepiness. Her primary care physician and started on Prozac 20 mg a day and Abilify has recently been added. She is also having significant right shoulder pain and Dr. Alden Server has given her injections which have helped only partially. She states her short-term memory is poor but her speech has recovered completely. She feels her headaches are better after increasing Topamax to 50 mg twice daily. Headache frequency noise about twice a week. Update 11/26/2015 ; she returns for follow-up after last visit 3 months ago accompanied by husband. She is doing well and has not had any recurrent seizures in fact she states even her headaches have disappeared. She remains on Topamax 50 mg twice daily which is tolerating well without side effects as well as Keppra 1500 mg twice daily. She is now seeing a psychiatrist for depression and Cymbalta has been changed to a new medication a week ago. She continues to have decreased short-term memory. She has trouble walking with dragging of the right foot. She does wear AFO and is getting Botox by Dr. Barbaraann Cao. She had follow-up MRI scan of the brain with contrast on 09/19/15 which I personally reviewed shows expected evolutionary changes in her left parietal hematoma with no enhancing lesions. The previously seen subtle left medial temporal hippocampal hyperintensity is not well appreciated and has not progressed. She wants to drive and be more independent but has not yet ready to return  to work. ROS:   14 system review of systems is positive for  memory loss, headache, depression, anxiety, anxiety, decreased concentration gait difficulty, dragging the right leg and  All other systems negative  PMH:  Past Medical History  Diagnosis Date  . Anxiety   . ICH (intracerebral  hemorrhage) ([place])   . Stroke ([place])   . Seizures ([place])     Social History:  Social History   Social History  . Marital Status: Married    Spouse Name: N/A  . Number of Children: N/A  . Years of Education: N/A   Occupational History  . Not on file.   Social History Main Topics  . Smoking status: Never Smoker   . Smokeless tobacco: Never Used  . Alcohol Use: 0.6 oz/week    1 Glasses of wine per week     Comment: casual   . Drug Use: No  . Sexual Activity: Not on file   Other Topics Concern  . Not on file   Social History Narrative    Medications:   Current Outpatient Prescriptions on File Prior to Visit  Medication Sig Dispense Refill  . ampicillin (PRINCIPEN) 500 MG capsule   4  . diclofenac sodium (VOLTAREN) 1 % GEL Apply 2 g topically 4 (four) times daily. 3 Tube 1  . levETIRAcetam (KEPPRA) 750 MG tablet TAKE 2 TABLETS BY MOUTH TWICE A DAY 120 tablet 1  . topiramate (TOPAMAX) 50 MG tablet Take 1 tablet (50 mg total) by mouth 2 (two) times daily. 60 tablet 1   Current Facility-Administered Medications on File Prior to Visit  Medication Dose Route Frequency Provider Last Rate Last Dose  . gadopentetate dimeglumine (MAGNEVIST) injection 12 mL  12 mL Intravenous Once PRN Garvin Fila, MD        Allergies:  No Known Allergies  Physical Exam General: Pleasant young Caucasian lady, seated, in no evident distress Head: head normocephalic and atraumatic.  Neck: supple with no carotid or supraclavicular bruits Cardiovascular: regular rate and rhythm, no murmurs Musculoskeletal: no deformity Skin:  no rash/petichiae Vascular:  Normal pulses all extremities Filed Vitals:   11/26/15 1630  BP: 93/59  Pulse: 70   Neurologic Exam Mental Status: Awake and fully alert. Oriented to place and time. Recent and remote memory intact. Attention span, concentration and fund of knowledge appropriate. Mood and affect appropriate. Speech is fluent without paraphasias or word  finding difficulty Cranial Nerves: Fundoscopic exam not done  . Pupils equal, briskly reactive to light. Extraocular movements full without nystagmus. Visual fields full to confrontation. Hearing intact. Facial sensation intact. Face, tongue, palate moves normally and symmetrically. Right nasolabial fold asymmetry when she smiles. Motor: Normal bulk and tone. Normal strength in all tested extremity muscles. Diminished fine finger movements on the right. Orbits left or right upper extremity. Mild weakness of right hip flexors. Right foot drop with ankle dorsi flexor weakness. Increased tone and spasticity in the right leg. Sensory.: intact to touch ,pinprick .position and vibratory sensation.  Coordination: Rapid alternating movements normal in all extremities. Finger-to-nose and heel-to-shin performed accurately bilaterally. Gait and Station: Arises from chair without difficulty. Stance is normal. Gait demonstrates dragging of the right foot with stiffness and right foot drop and wearing AFO Reflexes: 2+ and asymmetric and brisker on the right. Toes downgoing.       ASSESSMENT: 23 year Caucasian lady with postpartum large left parietal parenchymal/subarachnoid hemorrhage with seizures in august 2016 possibly from cortical vein thrombosis. MR venogram and workup  for hypercoagulable disorders was negative. Follow-up MRI scan in December 2016 shows expected evolutionary changes and resolution of previously seen left medial temporal lobe hyperintensity of unclear significance    PLAN:  I had a long discussion with the patient and husband regarding her a remote intracerebral hemorrhage and residual mild right-sided spastic hemiparesis. She is doing well without recurrent seizures and improvement in her headaches. I recommend she taper the Topamax to 50 mg daily for one week and then discontinue it. Continue Keppra 1500 mg twice daily for seizures. I recommend she start taking aspirin 81 mg daily. Continue  follow-up with Dr. March Rummage for Botox injections for right leg spasticity. Patient may drive now as she has been seizure-free for 6 months but I advised her to be careful and take it slowly. She will return for follow-up in 6 months or call earlier if necessary. Antony Contras, MD  Note: This document was prepared with digital dictation and possible smart phrase technology. Any transcriptional errors that result from this process are unintentional

## 2015-11-27 ENCOUNTER — Encounter: Payer: Self-pay | Admitting: Occupational Therapy

## 2015-11-27 ENCOUNTER — Ambulatory Visit: Payer: BLUE CROSS/BLUE SHIELD | Admitting: Occupational Therapy

## 2015-11-27 ENCOUNTER — Ambulatory Visit: Payer: BLUE CROSS/BLUE SHIELD

## 2015-11-27 DIAGNOSIS — R29898 Other symptoms and signs involving the musculoskeletal system: Secondary | ICD-10-CM

## 2015-11-27 DIAGNOSIS — M25511 Pain in right shoulder: Secondary | ICD-10-CM

## 2015-11-27 DIAGNOSIS — R278 Other lack of coordination: Secondary | ICD-10-CM

## 2015-11-27 DIAGNOSIS — IMO0002 Reserved for concepts with insufficient information to code with codable children: Secondary | ICD-10-CM

## 2015-11-27 DIAGNOSIS — G8111 Spastic hemiplegia affecting right dominant side: Secondary | ICD-10-CM

## 2015-11-27 DIAGNOSIS — R269 Unspecified abnormalities of gait and mobility: Secondary | ICD-10-CM

## 2015-11-27 NOTE — Therapy (Signed)
[facility] [address] [place], [place], [address] Phone: [phone]   Fax:  [phone]  Physical Therapy Treatment  Patient Details  Name: Ashley [name] MRN: [id number] Date of Birth: [birth year] No Data Recorded  Encounter Date: 11/27/2015      PT End of Session - 11/27/15 1313    Visit Number 40   Number of Visits 75   Date for PT Re-Evaluation 12/13/15   Authorization Type BCBS. Per Laverda Page, visits now 30 for PT and OT total (15 per OT and 15 per PT). Pt wishes to continue at self pay after visit limit met.    Authorization - Visit Number 13   Authorization - Number of Visits 15   PT Start Time 1856   PT Stop Time 1144   PT Time Calculation (min) 41 min   Equipment Utilized During Treatment Gait belt  SOT harness   Activity Tolerance Patient tolerated treatment well   Behavior During Therapy WFL for tasks assessed/performed      Past Medical History  Diagnosis Date  . Anxiety   . ICH (intracerebral hemorrhage) ([place])   . Stroke ([place])   . Seizures [facility])     Past Surgical History  Procedure Laterality Date  . Dilation and curettage of uterus      There were no vitals filed for this visit.  Visit Diagnosis:  Abnormality of gait  Right leg weakness  Coordination impairment      Subjective Assessment - 11/27/15 1106    Subjective Pt reported she saw Dr. Leonie Man yesterday, and he is decr. pt's Topomax (tapering down). Pt denied falls or changes since last visit.    Patient is accompained by: Family member   Pertinent History Seizures, low BP   Patient Stated Goals "Be as close to normal as much as possible and to go back to running" Get back to taking care of my kids, 50 weeks old and 67 and 50 years old   Currently in Pain? No/denies                         [facility] Adult PT Treatment/Exercise - 11/27/15 1116    Ambulation/Gait   Ambulation/Gait Yes   Ambulation/Gait Assistance  5: Supervision;4: Min guard   Ambulation/Gait Assistance Details Pt amb. with RW, SPC, and with 1 UE at counter (min guard with SPC and at counter), otherwise S. Cues to improve R stance time, L step length, wt. shifting onto R LE, and to shift R hip ant. during stance.    Ambulation Distance (Feet) 75 Feet  117' with RW, 117 and 54' with SPC, 6x7'at counter   Assistive device Rolling walker;Straight cane;Other (Comment)  at counter   Gait Pattern Decreased stance time - right;Decreased weight shift to right;Right foot flat;Decreased hip/knee flexion - right;Step-through pattern;Decreased arm swing - right;Decreased step length - left;Trunk rotated posteriorly on right   Ambulation Surface Level;Indoor        Neuro re-ed: Pension scheme manager with harness and min guard to S for safety: pt performed ant/post/lat wt. Shifting activities in training mode with screen on for visual cues. Pt performed in 1-2 minute bouts x2/activity, at 25-45% length from center starting point, with pt actually hitting target vs. Last session pt unable to successfully shift wt. To target. Pt had 5 seconds to reach target this session vs. 7-10 seconds last session.  Tactile, verbal and manual cues for technique, improve R lat/post wt. Shifting  and to decr. Trunk flexion. PT also demonstrated proper technique.  Pt also performed 2x10 wall hip bumps to encourage wt. Shifting in ant. Direction with cues and demonstration for technique.              PT Short Term Goals - 08/23/15 1203    PT SHORT TERM GOAL #1   Title Pt will be IND in HEP to improve strength, balance, and endurance. Target date: 07/27/15.   Status Partially Met   PT SHORT TERM GOAL #2   Title Pt will improve gait speed to >/=1.31f/sec to decrease falls risk. Target date: 07/27/15.   Status Achieved   PT SHORT TERM GOAL #3   Title Pt will ambulate 300' with LRAD, over even terrain, at MOD I level to improve functional mobility. Target date:  07/27/15.   Status Achieved   PT SHORT TERM GOAL #4   Title Pt will perform TUG in </=13.5 seconds with LRAD to decr. falls risk. Target date: 07/27/15.   Status Partially Met   PT SHORT TERM GOAL #5   Title Perform BERG and write STG and LTG as appropriate. Target date: 07/27/15.   Status Achieved   PT SHORT TERM GOAL #6   Title Pt will improve BERG to >/=51/56 to decr. falls risk. Target date: 07/27/15.   Status Achieved           PT Long Term Goals - 11/08/15 1614    PT LONG TERM GOAL #1   Title Pt will ambulate 1000' over even/uneven terrain, with LRAD, at MOD I level to improve functional mobiliity. Target date: 12/11/15.   Baseline deferred to new 4 week POC. All unmet goals will be carried over to new 4 week POC: 12/11/15   Status On-going   PT LONG TERM GOAL #2   Title Pt will amb. 300' over even terrain without an AD, IND, to safely amb. in home. Target date: 12/11/15   Status On-going   PT LONG TERM GOAL #3   Title Pt will improve gait speed to >/=2.634fsec, with LRAD, to amb. safely in the community. Target date: 11/12/14.   Status Achieved   PT LONG TERM GOAL #4   Title Pt will verbalize plans to join a fitness center upon d/c from PT to continue to maintain strength and endurance gains made during PT. Target date: 12/11/15.   Status On-going   PT LONG TERM GOAL #5   Title Pt improve BERG score to >/=55/56 to decr. falls risk. Target date: 12/11/15   Status On-going   Additional Long Term Goals   Additional Long Term Goals Yes   PT LONG TERM GOAL #6   Title Pt will ascend/descend curb with LRAD at MOD I level to improve functional mobility. Target date: 12/11/15   Status New   PT LONG TERM GOAL #7   Title Pt will ascend/descend 8 steps with one handrail at MOD I level to improve functional mobility. Target date: 12/11/15   Status New               Plan - 11/27/15 1313    Clinical Impression Statement Pt demonstrated progress, as she was able to hit visual  targets on balance master in ant/lat/post directions, requiring less time (5 seconds) and at further points from center (up to 45%). Pt continues to require cues to improve R wt. shifting and to protract R hip during gait, especially when not using RW. Continue with POC.    Pt will benefit  from skilled therapeutic intervention in order to improve on the following deficits Abnormal gait;Decreased endurance;Impaired sensation;Decreased knowledge of precautions;Decreased activity tolerance;Decreased knowledge of use of DME;Decreased strength;Impaired UE functional use;Impaired tone;Decreased balance;Decreased mobility;Decreased cognition;Decreased range of motion;Decreased safety awareness;Decreased coordination;Impaired flexibility   Rehab Potential Good   Clinical Impairments Affecting Rehab Potential Seizures which may limit intensity of PT   PT Frequency 2x / week   PT Duration 4 weeks   PT Treatment/Interventions ADLs/Self Care Home Management;Neuromuscular re-education;Cognitive remediation;Biofeedback;DME Instruction;Gait training;Stair training;Canalith Repostioning;Patient/family education;Orthotic Fit/Training;Balance training;Therapeutic exercise;Manual techniques;Therapeutic activities;Vestibular   PT Next Visit Plan Gait training with SPC, SOT with visual cues on screen (posterior and R lateral directions)   PT Home Exercise Plan Stretching/strength/balance HEP   Consulted and Agree with Plan of Care Patient        Problem List Patient Active Problem List   Diagnosis Date Noted  . Spastic hemiplegia and hemiparesis affecting dominant side ([place]) 09/13/2015  . Adhesive capsulitis of right shoulder 08/24/2015  . Symptomatic partial epilepsy with simple partial seizures ([place]) 08/21/2015  . Nontraumatic cortical hemorrhage of cerebral hemisphere ([place])   . Seizure ([place])   . Seizures ([place])   . Adjustment disorder with depressed mood   . Contracture of muscle ankle and foot 06/11/2015  .  Hemiplga fol ntrm intcrbl hemor aff right dominant side 06/06/2015  . Left-sided intracerebral hemorrhage ([place]) 06/04/2015  . Seizure disorder as sequela of cerebrovascular accident ([place])   . Seizure disorder ([place]) 06/03/2015  . Sepsis ([place]) 06/03/2015  . UTI (urinary tract infection) 06/03/2015  . Hypotension 06/02/2015  . Right spastic hemiparesis ([place]) 05/28/2015  . 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 History of anxiety disorder 05/28/2015  . ICH (intracerebral hemorrhage) ([place])   . Seizure disorder, nonconvulsive, with status epilepticus ([place])   . Cerebral venous thrombosis of cortical vein   . Cytotoxic cerebral edema ([place])   . IVH (intraventricular hemorrhage) ([place])   . Term pregnancy 05/07/2015  . Spontaneous vaginal delivery 05/07/2015    Jennaya Pogue L 11/27/2015, 1:16 PM  [facility] [address] [place], [place], [address] Phone: [phone]   Fax:  [phone]  Name: Ashley [name] MRN: [id number] Date of Birth: [birth year]    Geoffry Paradise, PT,DPT 11/27/2015 1:16 PM Phone: [phone] Fax: [phone]

## 2015-11-27 NOTE — Therapy (Signed)
[facility] [address] [place], [place], [address] Phone: [phone]   Fax:  [phone]  Occupational Therapy Treatment  Patient Details  Name: Ashley [name] MRN: [id number] Date of Birth: 05/27/1976 No Data Recorded  Encounter Date: 11/27/2015      OT End of Session - 11/27/15 1308    Visit Number 14   Number of Visits 15   Date for OT Re-Evaluation 12/21/15   Authorization Type BCBS   Authorization Time Period 30 visit COMBINED between PT/OT   Authorization - Visit Number 58   Authorization - Number of Visits 15   OT Start Time 1017   OT Stop Time 1101   OT Time Calculation (min) 44 min   Activity Tolerance Patient tolerated treatment well      Past Medical History  Diagnosis Date  . Anxiety   . ICH (intracerebral hemorrhage) ([place])   . Stroke ([place])   . Seizures [facility])     Past Surgical History  Procedure Laterality Date  . Dilation and curettage of uterus      There were no vitals filed for this visit.  Visit Diagnosis:  Right spastic hemiparesis ([facility])  Pain in joint of right shoulder  Weakness due to cerebrovascular accident  Coordination impairment      Subjective Assessment - 11/27/15 1024    Subjective  My arm feels better   Pertinent History see epic snapshot; pt with SAH 2 weeks after deliverying her 3rd child   Patient Stated Goals to be able to use my R arm normally   Currently in Pain? No/denies  just tightness in R shoulder                      OT Treatments/Exercises (OP) - 11/27/15 0001    Neurological Re-education Exercises   Other Exercises 1 Neuro re ed in standing with emphasis on active weight shift to R, bilateral and then unilateral reaching into cabinet to put dishes away. Pt able to complete with mod vc's for weight shift and occassional cues for UE compensations as well as supervision to occassional contact guard for balance.                    OT Short Term Goals - 11/27/15 1306    OT SHORT TERM GOAL #6   Period Weeks   OT SHORT TERM GOAL #8   Title Pt will retrieve a lightweight object at 100 shoulder flexion with min compensations and pain no greater than 3/10. - 11/23/2015 (4 weeks from recertification)   Status Achieved   OT SHORT TERM GOAL  #9   TITLE Pt will use RUE as an active assist for ADLs/ IADLS 70 % of the time.   Status Achieved   OT SHORT TERM GOAL  #10   TITLE Pt will report that she is able to fasten her bra and perform toileting hygiene with greater ease.   Status Partially Met  toilet hygiene easier           OT Long Term Goals - 11/27/15 1306    OT LONG TERM GOAL #1   Period Weeks   OT LONG TERM GOAL #2   Title revised long term goal- modified independent with cooking and home management with pt demonstrating good safety awareness - 12/21/2015 (8 weeks from recertification)   Status On-going   OT LONG TERM GOAL #4   Title revised goal- Pt will use RUE as an  active assist 75% of the time - see updated short and long term goals 12/21/2015   Baseline pt reports using 60% of the time as active assist   Status On-going   OT LONG TERM GOAL #5   Title Pt will demonstrate ability to perform a physical and cognitive task simultaneously with 80% or better accuracy.   Status Deferred   OT LONG TERM GOAL #7   Title Pt will report using RUE as an active assist 80% of the time for ADLs/ IADLS. 12/21/15   Status On-going   OT LONG TERM GOAL #8   Title Pt will retrieve a light weight object at 110  shoulder flexion with only min compensations.   Status On-going   OT LONG TERM GOAL  #9   Baseline I with updated HEP.   Status On-going               Plan - 11/27/15 1307    Clinical Impression Statement Pt continues to make excellent progress toward goals and improvement in abiltiy to shift to her right as well as improvement in RUE reach   Pt will benefit from skilled  therapeutic intervention in order to improve on the following deficits (Retired) Abnormal gait;Decreased coordination;Decreased range of motion;Difficulty walking;Impaired flexibility;Decreased endurance;Decreased safety awareness;Increased edema;Impaired tone;Decreased knowledge of precautions;Decreased activity tolerance;Decreased balance;Decreased knowledge of use of DME;Pain;Impaired UE functional use;Impaired vision/preception;Decreased cognition;Decreased mobility;Decreased strength   Rehab Potential Good   Clinical Impairments Affecting Rehab Potential cognition, impaired sensation and anxiety   OT Frequency 2x / week   OT Duration 8 weeks   OT Treatment/Interventions Self-care/ADL training;Therapeutic exercise;Patient/family education;Balance training;Ultrasound;Neuromuscular education;Manual Therapy;Splinting;Therapeutic exercises;Energy conservation;Parrafin;DME and/or AE instruction;Therapeutic activities;Cognitive remediation/compensation;Gait Training;Fluidtherapy;Electrical Stimulation;Moist Heat;Contrast Bath;Passive range of motion;Visual/perceptual remediation/compensation   Plan NMR for mid to high reach with heavier objects, trunk control, alignment functional ambulation   Consulted and Agree with Plan of Care Patient        Problem List Patient Active Problem List   Diagnosis Date Noted  . Spastic hemiplegia and hemiparesis affecting dominant side ([place]) 09/13/2015  . Adhesive capsulitis of right shoulder 08/24/2015  . Symptomatic partial epilepsy with simple partial seizures ([place]) 08/21/2015  . Nontraumatic cortical hemorrhage of cerebral hemisphere ([place])   . Seizure ([place])   . Seizures ([place])   . Adjustment disorder with depressed mood   . Contracture of muscle ankle and foot 06/11/2015  . Hemiplga fol ntrm intcrbl hemor aff right dominant side 06/06/2015  . Left-sided intracerebral hemorrhage ([place]) 06/04/2015  . Seizure disorder as sequela of cerebrovascular accident  ([place])   . Seizure disorder ([place]) 06/03/2015  . Sepsis ([place]) 06/03/2015  . UTI (urinary tract infection) 06/03/2015  . Hypotension 06/02/2015  . Right spastic hemiparesis ([place]) 05/28/2015  . 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 History of anxiety disorder 05/28/2015  . ICH (intracerebral hemorrhage) ([place])   . Seizure disorder, nonconvulsive, with status epilepticus ([place])   . Cerebral venous thrombosis of cortical vein   . Cytotoxic cerebral edema ([place])   . IVH (intraventricular hemorrhage) ([place])   . Term pregnancy 05/07/2015  . Spontaneous vaginal delivery 05/07/2015    Quay Burow, OTR/L 11/27/2015, 1:10 PM  [facility] [address] [place], [place], [address] Phone: [phone]   Fax:  [phone]  Name: Ashley [name] MRN: [id number] Date of Birth: 05/15/1976

## 2015-11-29 ENCOUNTER — Ambulatory Visit: Payer: BLUE CROSS/BLUE SHIELD

## 2015-11-29 DIAGNOSIS — G8111 Spastic hemiplegia affecting right dominant side: Secondary | ICD-10-CM | POA: Diagnosis not present

## 2015-11-29 DIAGNOSIS — R269 Unspecified abnormalities of gait and mobility: Secondary | ICD-10-CM

## 2015-11-29 NOTE — Therapy (Signed)
[facility] [address] [place], [place], [address] Phone: [phone]   Fax:  [phone]  Physical Therapy Treatment  Patient Details  Name: Ashley [name] MRN: [id number] Date of Birth: 07-23-1976 No Data Recorded  Encounter Date: 11/29/2015      PT End of Session - 11/29/15 1222    Visit Number 41   Number of Visits 42   Date for PT Re-Evaluation 12/13/15   Authorization Type BCBS. Per Laverda Page, visits now 30 for PT and OT total (15 per OT and 15 per PT). Pt wishes to continue at self pay after visit limit met.    Authorization - Visit Number 14   Authorization - Number of Visits 15   PT Start Time 1016   PT Stop Time 1058   PT Time Calculation (min) 42 min   Equipment Utilized During Treatment Gait belt  and BWSTT harness   Activity Tolerance Patient tolerated treatment well   Behavior During Therapy WFL for tasks assessed/performed      Past Medical History  Diagnosis Date  . Anxiety   . ICH (intracerebral hemorrhage) ([place])   . Stroke ([place])   . Seizures [facility])     Past Surgical History  Procedure Laterality Date  . Dilation and curettage of uterus      There were no vitals filed for this visit.  Visit Diagnosis:  Abnormality of gait      Subjective Assessment - 11/29/15 1018    Subjective Pt denied changes or falls since last visit.    Patient is accompained by: Family member   Pertinent History Seizures, low BP   Patient Stated Goals "Be as close to normal as much as possible and to go back to running" Get back to taking care of my kids, 73 weeks old and 67 and 61 years old   Currently in Pain? No/denies                         [facility] Adult PT Treatment/Exercise - 11/29/15 1019    Ambulation/Gait   Ambulation/Gait Yes   Ambulation/Gait Assistance 5: Supervision;4: Min guard   Ambulation/Gait Assistance Details Pt amb. over even terrain with RW with and without AFO donned and  on BWSTT with and without AFO donned, 6 minutes x2 during BWSTT, at 1.31mh without AFO and 1.29m with AFO. Cues to improve B heel strike, stride length, and to protract R hip    Ambulation Distance (Feet) --  75'x2 and 35063m BWSTT   Assistive device Rolling walker;Body weight support system   Gait Pattern Decreased stance time - right;Decreased weight shift to right;Right foot flat;Decreased hip/knee flexion - right;Step-through pattern;Decreased step length - left;Trunk rotated posteriorly on right   Ambulation Surface Level;Indoor;Other (comment)  BWSTT   High Level Balance   High Level Balance Activities Other (comment)  in // bars with 2 UE support and min guard   High Level Balance Comments Pt performed heel and toe amb. 2x7' with cues for technique.        PT also performed R DF in seated position prior to heel amb. In //bars to activate ant. Tibialis. Pt experienced R ankle inversion with yellow theraband placed around R foot 2/2 weak evertors. PT educated pt on not using band during R ankle DF at home.          PT Education - 11/29/15 1221    Education provided Yes   Education Details  PT educated pt on BWSTT results, as it indicated pt spent 50% on each LE, indicating progress.    Person(s) Educated Patient   Methods Explanation   Comprehension Verbalized understanding          PT Short Term Goals - 08/23/15 1203    PT SHORT TERM GOAL #1   Title Pt will be IND in HEP to improve strength, balance, and endurance. Target date: 07/27/15.   Status Partially Met   PT SHORT TERM GOAL #2   Title Pt will improve gait speed to >/=1.79ft/sec to decrease falls risk. Target date: 07/27/15.   Status Achieved   PT SHORT TERM GOAL #3   Title Pt will ambulate 300' with LRAD, over even terrain, at MOD I level to improve functional mobility. Target date: 07/27/15.   Status Achieved   PT SHORT TERM GOAL #4   Title Pt will perform TUG in </=13.5 seconds with LRAD to decr. falls risk.  Target date: 07/27/15.   Status Partially Met   PT SHORT TERM GOAL #5   Title Perform BERG and write STG and LTG as appropriate. Target date: 07/27/15.   Status Achieved   PT SHORT TERM GOAL #6   Title Pt will improve BERG to >/=51/56 to decr. falls risk. Target date: 07/27/15.   Status Achieved           PT Long Term Goals - 11/08/15 1614    PT LONG TERM GOAL #1   Title Pt will ambulate 1000' over even/uneven terrain, with LRAD, at MOD I level to improve functional mobiliity. Target date: 12/11/15.   Baseline deferred to new 4 week POC. All unmet goals will be carried over to new 4 week POC: 12/11/15   Status On-going   PT LONG TERM GOAL #2   Title Pt will amb. 300' over even terrain without an AD, IND, to safely amb. in home. Target date: 12/11/15   Status On-going   PT LONG TERM GOAL #3   Title Pt will improve gait speed to >/=2.24ft/sec, with LRAD, to amb. safely in the community. Target date: 11/12/14.   Status Achieved   PT LONG TERM GOAL #4   Title Pt will verbalize plans to join a fitness center upon d/c from PT to continue to maintain strength and endurance gains made during PT. Target date: 12/11/15.   Status On-going   PT LONG TERM GOAL #5   Title Pt improve BERG score to >/=55/56 to decr. falls risk. Target date: 12/11/15   Status On-going   Additional Long Term Goals   Additional Long Term Goals Yes   PT LONG TERM GOAL #6   Title Pt will ascend/descend curb with LRAD at MOD I level to improve functional mobility. Target date: 12/11/15   Status New   PT LONG TERM GOAL #7   Title Pt will ascend/descend 8 steps with one handrail at MOD I level to improve functional mobility. Target date: 12/11/15   Status New               Plan - 11/29/15 1222    Clinical Impression Statement Pt demonstrated progress, as she spent 50% (equal) of time on each LE during BWSTT with and without AFO donned. Pt also required less cues during amb. with RW after BWSTT. Pt would continue to  benefit from skilled PT to improve safety during functional mobility.    Pt will benefit from skilled therapeutic intervention in order to improve on the following deficits Abnormal gait;Decreased  endurance;Impaired sensation;Decreased knowledge of precautions;Decreased activity tolerance;Decreased knowledge of use of DME;Decreased strength;Impaired UE functional use;Impaired tone;Decreased balance;Decreased mobility;Decreased cognition;Decreased range of motion;Decreased safety awareness;Decreased coordination;Impaired flexibility   Rehab Potential Good   Clinical Impairments Affecting Rehab Potential Seizures which may limit intensity of PT   PT Frequency 2x / week   PT Duration 4 weeks   PT Treatment/Interventions ADLs/Self Care Home Management;Neuromuscular re-education;Cognitive remediation;Biofeedback;DME Instruction;Gait training;Stair training;Canalith Repostioning;Patient/family education;Orthotic Fit/Training;Balance training;Therapeutic exercise;Manual techniques;Therapeutic activities;Vestibular   PT Next Visit Plan Gait training with SPC, SOT with visual cues on screen (posterior and R lateral directions)   PT Home Exercise Plan Stretching/strength/balance HEP   Consulted and Agree with Plan of Care Patient        Problem List Patient Active Problem List   Diagnosis Date Noted  . Spastic hemiplegia and hemiparesis affecting dominant side ([place]) 09/13/2015  . Adhesive capsulitis of right shoulder 08/24/2015  . Symptomatic partial epilepsy with simple partial seizures ([place]) 08/21/2015  . Nontraumatic cortical hemorrhage of cerebral hemisphere ([place])   . Seizure ([place])   . Seizures ([place])   . Adjustment disorder with depressed mood   . Contracture of muscle ankle and foot 06/11/2015  . Hemiplga fol ntrm intcrbl hemor aff right dominant side 06/06/2015  . Left-sided intracerebral hemorrhage ([place]) 06/04/2015  . Seizure disorder as sequela of cerebrovascular accident ([place])   . Seizure  disorder ([place]) 06/03/2015  . Sepsis ([place]) 06/03/2015  . UTI (urinary tract infection) 06/03/2015  . Hypotension 06/02/2015  . Right spastic hemiparesis ([place]) 05/28/2015  . 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 History of anxiety disorder 05/28/2015  . ICH (intracerebral hemorrhage) ([place])   . Seizure disorder, nonconvulsive, with status epilepticus ([place])   . Cerebral venous thrombosis of cortical vein   . Cytotoxic cerebral edema ([place])   . IVH (intraventricular hemorrhage) ([place])   . Term pregnancy 05/07/2015  . Spontaneous vaginal delivery 05/07/2015    Nirvan Laban L 11/29/2015, 12:29 PM  [facility] [address] [place], [place], [address] Phone: [phone]   Fax:  [phone]  Name: Valyn Latchford MRN: [id number] Date of Birth: [birth year]  Geoffry Paradise, PT,DPT 11/29/2015 12:34 PM Phone: [phone] Fax: [phone]

## 2015-12-04 ENCOUNTER — Encounter: Payer: Self-pay | Admitting: Occupational Therapy

## 2015-12-04 ENCOUNTER — Ambulatory Visit: Payer: BLUE CROSS/BLUE SHIELD

## 2015-12-04 ENCOUNTER — Ambulatory Visit: Payer: BLUE CROSS/BLUE SHIELD | Admitting: Occupational Therapy

## 2015-12-04 DIAGNOSIS — R278 Other lack of coordination: Secondary | ICD-10-CM

## 2015-12-04 DIAGNOSIS — R29898 Other symptoms and signs involving the musculoskeletal system: Secondary | ICD-10-CM

## 2015-12-04 DIAGNOSIS — G8111 Spastic hemiplegia affecting right dominant side: Secondary | ICD-10-CM

## 2015-12-04 DIAGNOSIS — R269 Unspecified abnormalities of gait and mobility: Secondary | ICD-10-CM

## 2015-12-04 DIAGNOSIS — M25511 Pain in right shoulder: Secondary | ICD-10-CM

## 2015-12-04 DIAGNOSIS — R279 Unspecified lack of coordination: Secondary | ICD-10-CM

## 2015-12-04 DIAGNOSIS — IMO0002 Reserved for concepts with insufficient information to code with codable children: Secondary | ICD-10-CM

## 2015-12-04 NOTE — Therapy (Signed)
[facility] [address] [place], [place], [address] Phone: [phone]   Fax:  [phone]  Occupational Therapy Treatment  Patient Details  Name: Ashley [name] MRN: [id number] Date of Birth: [birth year] No Data Recorded  Encounter Date: 12/04/2015      OT End of Session - 12/04/15 1249    Visit Number 15   Number of Visits 20   Date for OT Re-Evaluation 12/21/15   Authorization Type BCBS   Authorization Time Period 30 visit COMBINED between PT/OT;  pt has utilized benefit and will now be self pay starting next session.    Authorization - Visit Number 15   Authorization - Number of Visits 20   OT Start Time 1017   OT Stop Time 1102   OT Time Calculation (min) 45 min   Activity Tolerance Patient tolerated treatment well      Past Medical History  Diagnosis Date  . Anxiety   . ICH (intracerebral hemorrhage) ([place])   . Stroke ([place])   . Seizures [facility])     Past Surgical History  Procedure Laterality Date  . Dilation and curettage of uterus      There were no vitals filed for this visit.  Visit Diagnosis:  Coordination impairment  Right spastic hemiparesis ([facility])  Pain in joint of right shoulder  Weakness due to cerebrovascular accident      Subjective Assessment - 12/04/15 1033    Subjective  This is not a good day I can't stop crying   Pertinent History see epic snapshot; pt with [facility] 2 weeks after deliverying her 3rd child   Patient Stated Goals to be able to use my R arm normally   Currently in Pain? No/denies                      OT Treatments/Exercises (OP) - 12/04/15 0001    Neurological Re-education Exercises   Other Exercises 1 Neuro re ed in kneeling, half kneeling and standing with emphasis on transitional movements, weight shifting and stabilziing on the R while moving the left, trunk control and alignment, functional use of RUE as stabilizer with transitional movements vs  LUE. Pt with signficant improvement in ability to tolerate increased weight shfiting and activation on R side however needs repetition and practice due to cogntiive and sensory deficts.              Balance Exercises - 12/04/15 1110    Balance Exercises: Standing   SLS Eyes open;Solid surface;10 secs             OT Short Term Goals - 12/04/15 1246    OT SHORT TERM GOAL #6   Period Weeks   OT SHORT TERM GOAL #8   Title Pt will retrieve a lightweight object at 100 shoulder flexion with min compensations and pain no greater than 3/10. - 11/23/2015 (4 weeks from recertification)   Status Achieved   OT SHORT TERM GOAL  #9   TITLE Pt will use RUE as an active assist for ADLs/ IADLS 70 % of the time.   Status Achieved   OT SHORT TERM GOAL  #10   TITLE Pt will report that she is able to fasten her bra and perform toileting hygiene with greater ease.   Status Partially Met  toilet hygiene easier           OT Long Term Goals - 12/04/15 1246    OT LONG TERM GOAL #1  Period Weeks   OT LONG TERM GOAL #2   Title revised long term goal- modified independent with cooking and home management with pt demonstrating good safety awareness - 12/21/2015 (8 weeks from recertification)   Status On-going   OT LONG TERM GOAL #4   Title revised goal- Pt will use RUE as an active assist 75% of the time - see updated short and long term goals 12/21/2015   Baseline pt reports using 60% of the time as active assist   Status On-going   OT LONG TERM GOAL #5   Title Pt will demonstrate ability to perform a physical and cognitive task simultaneously with 80% or better accuracy.   Status Deferred   OT LONG TERM GOAL #7   Title Pt will report using RUE as an active assist 80% of the time for ADLs/ IADLS. 12/21/15   Status On-going   OT LONG TERM GOAL #8   Title Pt will retrieve a light weight object at 110  shoulder flexion with only min compensations.   Status On-going   OT LONG TERM GOAL  #9    Baseline I with updated HEP.   Status On-going               Plan - 12/04/15 1247    Clinical Impression Statement Pt with excellent progress toward goals and showing carry over with strategies to icnrease activation of R side into functional tasks.    Pt will benefit from skilled therapeutic intervention in order to improve on the following deficits (Retired) Abnormal gait;Decreased coordination;Decreased range of motion;Difficulty walking;Impaired flexibility;Decreased endurance;Decreased safety awareness;Increased edema;Impaired tone;Decreased knowledge of precautions;Decreased activity tolerance;Decreased balance;Decreased knowledge of use of DME;Pain;Impaired UE functional use;Impaired vision/preception;Decreased cognition;Decreased mobility;Decreased strength   Rehab Potential Good   Clinical Impairments Affecting Rehab Potential cognition, impaired sensation and anxiety   OT Frequency 2x / week   OT Duration 8 weeks   OT Treatment/Interventions Self-care/ADL training;Therapeutic exercise;Patient/family education;Balance training;Ultrasound;Neuromuscular education;Manual Therapy;Splinting;Therapeutic exercises;Energy conservation;Parrafin;DME and/or AE instruction;Therapeutic activities;Cognitive remediation/compensation;Gait Training;Fluidtherapy;Electrical Stimulation;Moist Heat;Contrast Bath;Passive range of motion;Visual/perceptual remediation/compensation   Plan NMR for alignment, trunk control and activation, high reach with RUE and overhead functional tasks with bilateral and unilateral reach   Consulted and Agree with Plan of Care Patient        Problem List Patient Active Problem List   Diagnosis Date Noted  . Spastic hemiplegia and hemiparesis affecting dominant side ([place]) 09/13/2015  . Adhesive capsulitis of right shoulder 08/24/2015  . Symptomatic partial epilepsy with simple partial seizures ([place]) 08/21/2015  . Nontraumatic cortical hemorrhage of cerebral  hemisphere ([place])   . Seizure ([place])   . Seizures ([place])   . Adjustment disorder with depressed mood   . Contracture of muscle ankle and foot 06/11/2015  . Hemiplga fol ntrm intcrbl hemor aff right dominant side 06/06/2015  . Left-sided intracerebral hemorrhage ([place]) 06/04/2015  . Seizure disorder as sequela of cerebrovascular accident ([place])   . Seizure disorder ([place]) 06/03/2015  . Sepsis ([place]) 06/03/2015  . UTI (urinary tract infection) 06/03/2015  . Hypotension 06/02/2015  . Right spastic hemiparesis ([place]) 05/28/2015  . 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 History of anxiety disorder 05/28/2015  . ICH (intracerebral hemorrhage) ([place])   . Seizure disorder, nonconvulsive, with status epilepticus ([place])   . Cerebral venous thrombosis of cortical vein   . Cytotoxic cerebral edema ([place])   . IVH (intraventricular hemorrhage) ([place])   . Term pregnancy 05/07/2015  . Spontaneous vaginal delivery 05/07/2015    Petty,  Estrella Myrtle, OTR/L 12/04/2015, 12:56 PM  [facility] [address] [place], [place], [address] Phone: [phone]   Fax:  [phone]  Name: Rashawnda Gaba MRN: [id number] Date of Birth: Aug 31, 1976

## 2015-12-04 NOTE — Patient Instructions (Signed)
1) Practice walking at counter with one had support. Walk 10 feet, then turn and walk 10 feet in the other direction. Make sure you walk SLOWLY, looking straight ahead, and make sure to shift your weight to the Right side, so you can take bigger steps with the Left foot.

## 2015-12-04 NOTE — Therapy (Signed)
[facility] [address] [place], [place], [address] Phone: [phone]   Fax:  [phone]  Physical Therapy Treatment  Patient Details  Name: Ashley [name] MRN: [id number] Date of Birth: 1976-02-28 No Data Recorded  Encounter Date: 12/04/2015      PT End of Session - 12/04/15 1601    Visit Number 42   Number of Visits 42   Date for PT Re-Evaluation 12/13/15   Authorization Type BCBS. Per Laverda Page, visits now 30 for PT and OT total (15 per OT and 15 per PT). Pt wishes to continue at self pay after visit limit met.    Authorization - Visit Number 15   Authorization - Number of Visits 15   PT Start Time 1104   PT Stop Time 1145   PT Time Calculation (min) 41 min   Equipment Utilized During Treatment --  SOT harness and min guard   Activity Tolerance Patient tolerated treatment well   Behavior During Therapy WFL for tasks assessed/performed      Past Medical History  Diagnosis Date  . Anxiety   . ICH (intracerebral hemorrhage) ([place])   . Stroke ([place])   . Seizures [facility])     Past Surgical History  Procedure Laterality Date  . Dilation and curettage of uterus      There were no vitals filed for this visit.  Visit Diagnosis:  Abnormality of gait  Decreased coordination  Right leg weakness      Subjective Assessment - 12/04/15 1107    Subjective Pt reported she had a little fall yesterday, while pushing the stroller. She had stopped and did not lock the stroller and it kept going, she fell on her knees but caught herself. Pt denied injury or hitting  head. Pt reported she had a good weekend, but has been frustrated over the last few days.    Patient is accompained by: Family member   Pertinent History Seizures, low BP   Patient Stated Goals "Be as close to normal as much as possible and to go back to running" Get back to taking care of my kids, 54 weeks old and 30 and 50 years old   Currently in Pain?  No/denies                         [facility] Adult PT Treatment/Exercise - 12/04/15 1110    Ambulation/Gait   Ambulation/Gait Yes   Ambulation/Gait Assistance 5: Supervision;4: Min guard   Ambulation/Gait Assistance Details Pt amb. at counter with 1 UE support and min guard to S for safety: 10x7'. Cues to decr. speed in order to improve lateral wt. shifting onto R LE, to improve L step length. Pt also amb. with 1-2 UE support on PT's shoulders or hips, with cues for upright posture, improve R LE wt.bearing and L step length.   Ambulation Distance (Feet) --  10x7' and 300'   Assistive device Other (Comment)   Gait Pattern --  PT shoulder/hip support   Ambulation Surface Level;Indoor      Neuro re-ed: Pension scheme manager with harness and min guard to S for safety (min A to place wt. Through R heel): pt performed ant/post/lat wt. Shifting activities in training mode with screen on for visual cues. Pt performed in 1-2 minute bouts x2/activity, at 45-50% length from center starting point.. Pt had 5-7 seconds to reach target this session. Tactile, verbal and manual cues for technique, improve R lat/post wt. Shifting  and to decr. Trunk flexion.        Balance Exercises - 12/04/15 1110    Balance Exercises: Standing   SLS Eyes open;Solid surface;10 secs   Other Standing Exercises With min guard to min and 0-1 UE support on chair: pt performed R SLS with L foot hovering over 4" step for 5 seconds x10 reps.            PT Education - 12/04/15 1601    Education provided Yes   Education Details Progressed pt to amb. at counter (HEP).   Person(s) Educated Patient   Methods Explanation;Demonstration;Tactile cues;Verbal cues;Handout   Comprehension Returned demonstration;Verbalized understanding          PT Short Term Goals - 08/23/15 1203    PT SHORT TERM GOAL #1   Title Pt will be IND in HEP to improve strength, balance, and endurance. Target date: 07/27/15.   Status Partially  Met   PT SHORT TERM GOAL #2   Title Pt will improve gait speed to >/=1.44f/sec to decrease falls risk. Target date: 07/27/15.   Status Achieved   PT SHORT TERM GOAL #3   Title Pt will ambulate 300' with LRAD, over even terrain, at MOD I level to improve functional mobility. Target date: 07/27/15.   Status Achieved   PT SHORT TERM GOAL #4   Title Pt will perform TUG in </=13.5 seconds with LRAD to decr. falls risk. Target date: 07/27/15.   Status Partially Met   PT SHORT TERM GOAL #5   Title Perform BERG and write STG and LTG as appropriate. Target date: 07/27/15.   Status Achieved   PT SHORT TERM GOAL #6   Title Pt will improve BERG to >/=51/56 to decr. falls risk. Target date: 07/27/15.   Status Achieved           PT Long Term Goals - 11/08/15 1614    PT LONG TERM GOAL #1   Title Pt will ambulate 1000' over even/uneven terrain, with LRAD, at MOD I level to improve functional mobiliity. Target date: 12/11/15.   Baseline deferred to new 4 week POC. All unmet goals will be carried over to new 4 week POC: 12/11/15   Status On-going   PT LONG TERM GOAL #2   Title Pt will amb. 300' over even terrain without an AD, IND, to safely amb. in home. Target date: 12/11/15   Status On-going   PT LONG TERM GOAL #3   Title Pt will improve gait speed to >/=2.660fsec, with LRAD, to amb. safely in the community. Target date: 11/12/14.   Status Achieved   PT LONG TERM GOAL #4   Title Pt will verbalize plans to join a fitness center upon d/c from PT to continue to maintain strength and endurance gains made during PT. Target date: 12/11/15.   Status On-going   PT LONG TERM GOAL #5   Title Pt improve BERG score to >/=55/56 to decr. falls risk. Target date: 12/11/15   Status On-going   Additional Long Term Goals   Additional Long Term Goals Yes   PT LONG TERM GOAL #6   Title Pt will ascend/descend curb with LRAD at MOD I level to improve functional mobility. Target date: 12/11/15   Status New   PT LONG  TERM GOAL #7   Title Pt will ascend/descend 8 steps with one handrail at MOD I level to improve functional mobility. Target date: 12/11/15   Status New  Plan - 12/04/15 1602    Clinical Impression Statement Pt demonstrated progress, as she was able to improve wt. shifting on R LE using 1 UE support only, at counter. Pt was able to perform wt shifting activities on Balance Master, at 50% away from center target with harness and min A to shift wt. onto R heel. Continue with POC.    Pt will benefit from skilled therapeutic intervention in order to improve on the following deficits Abnormal gait;Decreased endurance;Impaired sensation;Decreased knowledge of precautions;Decreased activity tolerance;Decreased knowledge of use of DME;Decreased strength;Impaired UE functional use;Impaired tone;Decreased balance;Decreased mobility;Decreased cognition;Decreased range of motion;Decreased safety awareness;Decreased coordination;Impaired flexibility   Rehab Potential Good   Clinical Impairments Affecting Rehab Potential Seizures which may limit intensity of PT   PT Frequency 2x / week   PT Duration 4 weeks   PT Treatment/Interventions ADLs/Self Care Home Management;Neuromuscular re-education;Cognitive remediation;Biofeedback;DME Instruction;Gait training;Stair training;Canalith Repostioning;Patient/family education;Orthotic Fit/Training;Balance training;Therapeutic exercise;Manual techniques;Therapeutic activities;Vestibular   PT Next Visit Plan Gait training without SPC (with one support on counter and PT), SOT with visual cues on screen (posterior and R lateral directions)   PT Home Exercise Plan Stretching/strength/balance HEP   Consulted and Agree with Plan of Care Patient        Problem List Patient Active Problem List   Diagnosis Date Noted  . Spastic hemiplegia and hemiparesis affecting dominant side ([place]) 09/13/2015  . Adhesive capsulitis of right shoulder 08/24/2015  .  Symptomatic partial epilepsy with simple partial seizures ([place]) 08/21/2015  . Nontraumatic cortical hemorrhage of cerebral hemisphere ([place])   . Seizure ([place])   . Seizures ([place])   . Adjustment disorder with depressed mood   . Contracture of muscle ankle and foot 06/11/2015  . Hemiplga fol ntrm intcrbl hemor aff right dominant side 06/06/2015  . Left-sided intracerebral hemorrhage ([place]) 06/04/2015  . Seizure disorder as sequela of cerebrovascular accident ([place])   . Seizure disorder ([place]) 06/03/2015  . Sepsis ([place]) 06/03/2015  . UTI (urinary tract infection) 06/03/2015  . Hypotension 06/02/2015  . Right spastic hemiparesis ([place]) 05/28/2015  . 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 History of anxiety disorder 05/28/2015  . ICH (intracerebral hemorrhage) ([place])   . Seizure disorder, nonconvulsive, with status epilepticus ([place])   . Cerebral venous thrombosis of cortical vein   . Cytotoxic cerebral edema ([place])   . IVH (intraventricular hemorrhage) ([place])   . Term pregnancy 05/07/2015  . Spontaneous vaginal delivery 05/07/2015    Isebella Upshur L 12/04/2015, 4:06 PM  [facility] [address] [place], [place], [address] Phone: [phone]   Fax:  [phone]  Name: Lurlie Wigen MRN: [id number] Date of Birth: 1976-04-16     Geoffry Paradise, PT,DPT 12/04/2015 4:06 PM Phone: [phone] Fax: [phone]

## 2015-12-06 ENCOUNTER — Ambulatory Visit: Payer: BLUE CROSS/BLUE SHIELD

## 2015-12-06 ENCOUNTER — Encounter: Payer: Self-pay | Admitting: Occupational Therapy

## 2015-12-06 ENCOUNTER — Ambulatory Visit: Payer: BLUE CROSS/BLUE SHIELD | Admitting: Occupational Therapy

## 2015-12-06 DIAGNOSIS — R279 Unspecified lack of coordination: Secondary | ICD-10-CM

## 2015-12-06 DIAGNOSIS — M25511 Pain in right shoulder: Secondary | ICD-10-CM

## 2015-12-06 DIAGNOSIS — R269 Unspecified abnormalities of gait and mobility: Secondary | ICD-10-CM

## 2015-12-06 DIAGNOSIS — G8111 Spastic hemiplegia affecting right dominant side: Secondary | ICD-10-CM | POA: Diagnosis not present

## 2015-12-06 DIAGNOSIS — IMO0002 Reserved for concepts with insufficient information to code with codable children: Secondary | ICD-10-CM

## 2015-12-06 DIAGNOSIS — R278 Other lack of coordination: Secondary | ICD-10-CM

## 2015-12-06 DIAGNOSIS — R29898 Other symptoms and signs involving the musculoskeletal system: Secondary | ICD-10-CM

## 2015-12-06 NOTE — Therapy (Signed)
[facility] [address] [place], [place], [address] Phone: [phone]   Fax:  [phone]  Physical Therapy Treatment  Patient Details  Name: Ashley [name] MRN: [id number] Date of Birth: 1976-10-13 No Data Recorded  Encounter Date: 12/06/2015      PT End of Session - 12/06/15 1208    Visit Number 43   Number of Visits 63   Date for PT Re-Evaluation 12/13/15   Authorization Type BCBS. Per Laverda Page, visits now 30 for PT and OT total (15 per OT and 15 per PT). Pt wishes to continue at self pay after visit limit met.    Authorization - Visit Number 16   Authorization - Number of Visits 15   PT Start Time 1019   PT Stop Time 1100   PT Time Calculation (min) 41 min   Equipment Utilized During Treatment Other (comment)  BWSTT harness and min guard support   Activity Tolerance Patient tolerated treatment well   Behavior During Therapy WFL for tasks assessed/performed      Past Medical History  Diagnosis Date  . Anxiety   . ICH (intracerebral hemorrhage) ([place])   . Stroke ([place])   . Seizures [facility])     Past Surgical History  Procedure Laterality Date  . Dilation and curettage of uterus      There were no vitals filed for this visit.  Visit Diagnosis:  Abnormality of gait  Decreased coordination  Right leg weakness      Subjective Assessment - 12/06/15 1020    Subjective Pt denied falls since last visit. Pt reported MD incr. depression medication dosage yesterday.   Pertinent History Seizures, low BP   Patient Stated Goals "Be as close to normal as much as possible and to go back to running" Get back to taking care of my kids, 21 weeks old and 27 and 40 years old   Currently in Pain? No/denies                         [facility] Adult PT Treatment/Exercise - 12/06/15 1023    Ambulation/Gait   Ambulation/Gait Yes   Ambulation/Gait Assistance 5: Supervision;4: Min guard   Ambulation/Gait  Assistance Details Pt performed BWSTT, 3 bouts of 6 minutes, 2 bouts without R AFO donned. Pt then amb. overground with 1-2 UE support on PT, no AD. Cues to improve R hip protraction, B heel strike, stride length.   Ambulation Distance (Feet) --  BWSTT (18 minutes) and 6x40', 2x20'   Assistive device Body weight support system;Other (Comment)  UE support on PT   Gait Pattern Decreased stance time - right;Decreased weight shift to right;Right foot flat;Decreased hip/knee flexion - right;Step-through pattern;Decreased step length - left;Trunk rotated posteriorly on right   Ambulation Surface Level;Indoor                  PT Short Term Goals - 08/23/15 1203    PT SHORT TERM GOAL #1   Title Pt will be IND in HEP to improve strength, balance, and endurance. Target date: 07/27/15.   Status Partially Met   PT SHORT TERM GOAL #2   Title Pt will improve gait speed to >/=1.33f/sec to decrease falls risk. Target date: 07/27/15.   Status Achieved   PT SHORT TERM GOAL #3   Title Pt will ambulate 300' with LRAD, over even terrain, at MOD I level to improve functional mobility. Target date: 07/27/15.   Status Achieved  PT SHORT TERM GOAL #4   Title Pt will perform TUG in </=13.5 seconds with LRAD to decr. falls risk. Target date: 07/27/15.   Status Partially Met   PT SHORT TERM GOAL #5   Title Perform BERG and write STG and LTG as appropriate. Target date: 07/27/15.   Status Achieved   PT SHORT TERM GOAL #6   Title Pt will improve BERG to >/=51/56 to decr. falls risk. Target date: 07/27/15.   Status Achieved           PT Long Term Goals - 11/08/15 1614    PT LONG TERM GOAL #1   Title Pt will ambulate 1000' over even/uneven terrain, with LRAD, at MOD I level to improve functional mobiliity. Target date: 12/11/15.   Baseline deferred to new 4 week POC. All unmet goals will be carried over to new 4 week POC: 12/11/15   Status On-going   PT LONG TERM GOAL #2   Title Pt will amb. 300'  over even terrain without an AD, IND, to safely amb. in home. Target date: 12/11/15   Status On-going   PT LONG TERM GOAL #3   Title Pt will improve gait speed to >/=2.72f/sec, with LRAD, to amb. safely in the community. Target date: 11/12/14.   Status Achieved   PT LONG TERM GOAL #4   Title Pt will verbalize plans to join a fitness center upon d/c from PT to continue to maintain strength and endurance gains made during PT. Target date: 12/11/15.   Status On-going   PT LONG TERM GOAL #5   Title Pt improve BERG score to >/=55/56 to decr. falls risk. Target date: 12/11/15   Status On-going   Additional Long Term Goals   Additional Long Term Goals Yes   PT LONG TERM GOAL #6   Title Pt will ascend/descend curb with LRAD at MOD I level to improve functional mobility. Target date: 12/11/15   Status New   PT LONG TERM GOAL #7   Title Pt will ascend/descend 8 steps with one handrail at MOD I level to improve functional mobility. Target date: 12/11/15   Status New               Plan - 12/06/15 1209    Clinical Impression Statement Pt demonstrated progress, as she required less cues during amb. for improve R hip protraction. Pt also demonstrated improved endurance, as she was able to amb. 3 bouts of 6 minutes on treadmill, with standing rest breaks only during session. Continue with POC.   Pt will benefit from skilled therapeutic intervention in order to improve on the following deficits Abnormal gait;Decreased endurance;Impaired sensation;Decreased knowledge of precautions;Decreased activity tolerance;Decreased knowledge of use of DME;Decreased strength;Impaired UE functional use;Impaired tone;Decreased balance;Decreased mobility;Decreased cognition;Decreased range of motion;Decreased safety awareness;Decreased coordination;Impaired flexibility   Rehab Potential Good   Clinical Impairments Affecting Rehab Potential Seizures which may limit intensity of PT   PT Frequency 2x / week   PT Duration 4  weeks   PT Treatment/Interventions ADLs/Self Care Home Management;Neuromuscular re-education;Cognitive remediation;Biofeedback;DME Instruction;Gait training;Stair training;Canalith Repostioning;Patient/family education;Orthotic Fit/Training;Balance training;Therapeutic exercise;Manual techniques;Therapeutic activities;Vestibular   PT Next Visit Plan Assess goals and renew if appropriate.   PT Home Exercise Plan Stretching/strength/balance HEP   Consulted and Agree with Plan of Care Patient        Problem List Patient Active Problem List   Diagnosis Date Noted  . Spastic hemiplegia and hemiparesis affecting dominant side ([place] 09/13/2015  . Adhesive capsulitis of right shoulder 08/24/2015  .  Symptomatic partial epilepsy with simple partial seizures ([place]) 08/21/2015  . Nontraumatic cortical hemorrhage of cerebral hemisphere ([place])   . Seizure ([place])   . Seizures ([place])   . Adjustment disorder with depressed mood   . Contracture of muscle ankle and foot 06/11/2015  . Hemiplga fol ntrm intcrbl hemor aff right dominant side 06/06/2015  . Left-sided intracerebral hemorrhage ([place]) 06/04/2015  . Seizure disorder as sequela of cerebrovascular accident ([place])   . Seizure disorder ([place]) 06/03/2015  . Sepsis ([place]) 06/03/2015  . UTI (urinary tract infection) 06/03/2015  . Hypotension 06/02/2015  . Right spastic hemiparesis ([place]) 05/28/2015  . 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 History of anxiety disorder 05/28/2015  . ICH (intracerebral hemorrhage) ([place])   . Seizure disorder, nonconvulsive, with status epilepticus ([place])   . Cerebral venous thrombosis of cortical vein   . Cytotoxic cerebral edema ([place])   . IVH (intraventricular hemorrhage) ([place])   . Term pregnancy 05/07/2015  . Spontaneous vaginal delivery 05/07/2015    Wenda Vanschaick L 12/06/2015, 12:11 PM  [facility] [address] [place], [place], [address] Phone: [phone]   Fax:  [phone]  Name: Ashley [name] MRN: [id number] Date of Birth: 04-18-1976    Geoffry Paradise, PT,DPT 12/06/2015 12:11 PM Phone: [phone] Fax: [phone]

## 2015-12-06 NOTE — Therapy (Signed)
[facility] [address] [place], [place], [address] Phone: [phone]   Fax:  [phone]  Occupational Therapy Treatment  Patient Details  Name: Ashley [name] MRN: [id number] Date of Birth: 01/01/1976 No Data Recorded  Encounter Date: 12/06/2015      OT End of Session - 12/06/15 1206    Visit Number 16   Number of Visits 20   Date for OT Re-Evaluation 12/21/15   Authorization Type BCBS   Authorization Time Period 30 visit COMBINED between PT/OT;  pt has utilized benefit and will now be self pay starting next session.    Authorization - Visit Number 16   Authorization - Number of Visits 20   OT Start Time 0930   OT Stop Time 1016   OT Time Calculation (min) 46 min   Activity Tolerance Patient tolerated treatment well      Past Medical History  Diagnosis Date  . Anxiety   . ICH (intracerebral hemorrhage) ([place])   . Stroke ([place])   . Seizures [facility])     Past Surgical History  Procedure Laterality Date  . Dilation and curettage of uterus      There were no vitals filed for this visit.  Visit Diagnosis:  Coordination impairment  Right spastic hemiparesis (HCC)  Pain in joint of right shoulder  Weakness due to cerebrovascular accident      Subjective Assessment - 12/06/15 0941    Subjective  I can tell I am using muscles I haven't used    Pertinent History see epic snapshot; pt with [facility] 2 weeks after deliverying her 3rd child   Patient Stated Goals to be able to use my R arm normally   Currently in Pain? No/denies                      OT Treatments/Exercises (OP) - 12/06/15 0001    ADLs   ADL Comments Worked with pt today to establish new goals - pt able to identify several functional goals related to lfie roles and leisure.  Will incoprorate into renewal.  Also addressed transitional movements focused on assiting pt to be able to bath baby at bath level.  Praticed getting into  kneeling on blocks while reaching forward onto "tub".  Pt will need assistance putting baby in and out of tub however will work with mom at home to start to assist with baby's bath. Also focused on transitional movements (standing to kneeling to standing, sit to stand and stand to sit, turns) with emphasis on increasing weight bearing on RLE and alignment as well as engaging trunk to improve RUE function.                    OT Short Term Goals - 12/06/15 1202    OT SHORT TERM GOAL #6   Period Weeks   OT SHORT TERM GOAL #8   Title Pt will retrieve a lightweight object at 100 shoulder flexion with min compensations and pain no greater than 3/10. - 11/23/2015 (4 weeks from recertification)   Status Achieved   OT SHORT TERM GOAL  #9   TITLE Pt will use RUE as an active assist for ADLs/ IADLS 70 % of the time.   Status Achieved   OT SHORT TERM GOAL  #10   TITLE Pt will report that she is able to fasten her bra and perform toileting hygiene with greater ease.   Status Partially Met  toilet hygiene easier  OT Long Term Goals - 12/06/15 1202    OT LONG TERM GOAL #1   Period Weeks   OT LONG TERM GOAL #2   Title revised long term goal- modified independent with cooking and home management with pt demonstrating good safety awareness - 12/21/2015 (8 weeks from recertification)   Status Achieved   OT LONG TERM GOAL #4   Title revised goal- Pt will use RUE as an active assist 75% of the time - see updated short and long term goals 12/21/2015   Baseline pt reports using 60% of the time as active assist   Status On-going  12/06/2015 pt reports 70% of time as either assist or dominant use   OT LONG TERM GOAL #5   Title Pt will demonstrate ability to perform a physical and cognitive task simultaneously with 80% or better accuracy.   Status Deferred   OT LONG TERM GOAL #7   Title Pt will report using RUE as an active assist 80% of the time for ADLs/ IADLS. 12/21/15   Status On-going    OT LONG TERM GOAL #8   Title Pt will retrieve a light weight object at 110  shoulder flexion with only min compensations.   Status On-going   OT LONG TERM GOAL  #9   Baseline I with updated HEP.   Status Achieved               Plan - 12/06/15 1204    Clinical Impression Statement Pt continues to make good progress toward goals. Pt is very motivated to be as independent as possible.    Pt will benefit from skilled therapeutic intervention in order to improve on the following deficits (Retired) Abnormal gait;Decreased coordination;Decreased range of motion;Difficulty walking;Impaired flexibility;Decreased endurance;Decreased safety awareness;Increased edema;Impaired tone;Decreased knowledge of precautions;Decreased activity tolerance;Decreased balance;Decreased knowledge of use of DME;Pain;Impaired UE functional use;Impaired vision/preception;Decreased cognition;Decreased mobility;Decreased strength   Rehab Potential Good   Clinical Impairments Affecting Rehab Potential cognition, impaired sensation and anxiety   OT Frequency 2x / week   OT Duration 8 weeks   OT Treatment/Interventions Self-care/ADL training;Therapeutic exercise;Patient/family education;Balance training;Ultrasound;Neuromuscular education;Manual Therapy;Splinting;Therapeutic exercises;Energy conservation;Parrafin;DME and/or AE instruction;Therapeutic activities;Cognitive remediation/compensation;Gait Training;Fluidtherapy;Electrical Stimulation;Moist Heat;Contrast Bath;Passive range of motion;Visual/perceptual remediation/compensation   Plan NMR for alignment , trunk control and activation, high reach with RUE and overhead functional taks with billateral and unilateral reach, transitional movements   Consulted and Agree with Plan of Care Patient        Problem List Patient Active Problem List   Diagnosis Date Noted  . Spastic hemiplegia and hemiparesis affecting dominant side ([place]) 09/13/2015  . Adhesive capsulitis  of right shoulder 08/24/2015  . Symptomatic partial epilepsy with simple partial seizures ([place]) 08/21/2015  . Nontraumatic cortical hemorrhage of cerebral hemisphere ([place])   . Seizure ([place])   . Seizures ([place])   . Adjustment disorder with depressed mood   . Contracture of muscle ankle and foot 06/11/2015  . Hemiplga fol ntrm intcrbl hemor aff right dominant side 06/06/2015  . Left-sided intracerebral hemorrhage ([place]) 06/04/2015  . Seizure disorder as sequela of cerebrovascular accident ([place])   . Seizure disorder ([place]) 06/03/2015  . Sepsis ([place]) 06/03/2015  . UTI (urinary tract infection) 06/03/2015  . Hypotension 06/02/2015  . Right spastic hemiparesis ([place]) 05/28/2015  . 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 History of anxiety disorder 05/28/2015  . ICH (intracerebral hemorrhage) ([place])   . Seizure disorder, nonconvulsive, with status epilepticus ([place])   . Cerebral venous thrombosis of cortical vein   .  Cytotoxic cerebral edema ([place])   . IVH (intraventricular hemorrhage) ([place])   . Term pregnancy 05/07/2015  . Spontaneous vaginal delivery 05/07/2015    Quay Burow, OTR/L 12/06/2015, 12:12 PM  [facility] [address] [place], [place], [address] Phone: [phone]   Fax:  [phone]  Name: Ashley [name] MRN: [id number] Date of Birth: [birth year]

## 2015-12-10 DIAGNOSIS — Z0271 Encounter for disability determination: Secondary | ICD-10-CM

## 2015-12-11 ENCOUNTER — Encounter: Payer: Self-pay | Admitting: Occupational Therapy

## 2015-12-11 ENCOUNTER — Ambulatory Visit: Payer: BLUE CROSS/BLUE SHIELD | Admitting: Occupational Therapy

## 2015-12-11 ENCOUNTER — Other Ambulatory Visit: Payer: Self-pay | Admitting: Neurology

## 2015-12-11 ENCOUNTER — Ambulatory Visit: Payer: BLUE CROSS/BLUE SHIELD

## 2015-12-11 DIAGNOSIS — M25611 Stiffness of right shoulder, not elsewhere classified: Secondary | ICD-10-CM

## 2015-12-11 DIAGNOSIS — M25511 Pain in right shoulder: Secondary | ICD-10-CM

## 2015-12-11 DIAGNOSIS — R278 Other lack of coordination: Secondary | ICD-10-CM

## 2015-12-11 DIAGNOSIS — R2991 Unspecified symptoms and signs involving the musculoskeletal system: Secondary | ICD-10-CM

## 2015-12-11 DIAGNOSIS — G8111 Spastic hemiplegia affecting right dominant side: Secondary | ICD-10-CM

## 2015-12-11 DIAGNOSIS — R279 Unspecified lack of coordination: Secondary | ICD-10-CM

## 2015-12-11 DIAGNOSIS — IMO0002 Reserved for concepts with insufficient information to code with codable children: Secondary | ICD-10-CM

## 2015-12-11 DIAGNOSIS — R29818 Other symptoms and signs involving the nervous system: Secondary | ICD-10-CM

## 2015-12-11 DIAGNOSIS — R29898 Other symptoms and signs involving the musculoskeletal system: Secondary | ICD-10-CM

## 2015-12-11 DIAGNOSIS — R269 Unspecified abnormalities of gait and mobility: Secondary | ICD-10-CM

## 2015-12-11 NOTE — Therapy (Signed)
[facility] [address] [place], [place], [address] Phone: [phone]   Fax:  [phone]  Physical Therapy Treatment  Patient Details  Name: Meenakshi Sazama MRN: [id number] Date of Birth: January 16, 1976 No Data Recorded  Encounter Date: 12/11/2015      PT End of Session - 12/11/15 1348    Visit Number 25   Number of Visits 5   Date for PT Re-Evaluation 12/13/15   Authorization Type BCBS. Per Laverda Page, visits now 30 for PT and OT total (15 per OT and 15 per PT). Pt wishes to continue at self pay after visit limit met.    Authorization - Visit Number 17   Authorization - Number of Visits 15   PT Start Time 5465  pt with OT   PT Stop Time 1229   PT Time Calculation (min) 40 min   Equipment Utilized During Treatment Gait belt   Activity Tolerance Patient tolerated treatment well   Behavior During Therapy WFL for tasks assessed/performed      Past Medical History  Diagnosis Date  . Anxiety   . ICH (intracerebral hemorrhage) ([place])   . Stroke ([place])   . Seizures [facility])     Past Surgical History  Procedure Laterality Date  . Dilation and curettage of uterus      There were no vitals filed for this visit.  Visit Diagnosis:  Abnormality of gait  Right leg weakness  Decreased coordination      Subjective Assessment - 12/11/15 1154    Subjective Pt denied falls since last visit.    Patient is accompained by: Family member   Pertinent History Seizures, low BP   Patient Stated Goals "Be as close to normal as much as possible and to go back to running" Get back to taking care of my kids, 41 weeks old and 12 and 64 years old   Currently in Pain? No/denies                         [facility] Adult PT Treatment/Exercise - 12/11/15 1157    Ambulation/Gait   Ambulation/Gait Yes   Ambulation/Gait Assistance 5: Supervision   Ambulation/Gait Assistance Details Pt amb. over even terrain with 1-2 UE support on  PT's shoulders, and amb. over even/uneven terrain with RW. Cues to improve R hip protraction, heel strike and forward wt. shifting. No overt LOB episodes.    Ambulation Distance (Feet) --  600' outdoors and 50' indoors   Assistive device Rolling walker;Other (Comment)  1-2 UE support on PT   Gait Pattern Decreased stance time - right;Decreased weight shift to right;Right foot flat;Decreased hip/knee flexion - right;Step-through pattern;Decreased step length - left;Trunk rotated posteriorly on right   Ambulation Surface Level;Unlevel;Indoor;Outdoor;Paved   Stairs Yes   Stairs Assistance 5: Supervision   Stairs Assistance Details (indicate cue type and reason) L handrail and alternating pattern to ascend and R handrail and step to to descend.   Stair Management Technique One rail Left;One rail Right;Alternating pattern;Step to pattern   Number of Stairs 8   Height of Stairs 6   Curb Other (comment);4: Min assist  min guard   Standardized Balance Assessment   Standardized Balance Assessment Berg Balance Test   Berg Balance Test   Sit to Stand Able to stand without using hands and stabilize independently   Standing Unsupported Able to stand safely 2 minutes   Sitting with Back Unsupported but Feet Supported on Floor or Stool  Able to sit safely and securely 2 minutes   Stand to Sit Sits safely with minimal use of hands   Transfers Able to transfer safely, minor use of hands   Standing Unsupported with Eyes Closed Able to stand 10 seconds safely   Standing Ubsupported with Feet Together Able to place feet together independently and stand 1 minute safely   From Standing, Reach Forward with Outstretched Arm Can reach confidently >25 cm (10")  8" with R UE and 11" with L UE   From Standing Position, Pick up Object from Floor Able to pick up shoe safely and easily   From Standing Position, Turn to Look Behind Over each Shoulder Looks behind from both sides and weight shifts well   Turn 360 Degrees  Able to turn 360 degrees safely one side only in 4 seconds or less   Standing Unsupported, Alternately Place Feet on Step/Stool Able to stand independently and safely and complete 8 steps in 20 seconds   Standing Unsupported, One Foot in Front Able to place foot tandem independently and hold 30 seconds   Standing on One Leg Able to lift leg independently and hold > 10 seconds  L LE   Total Score 55           Self Care:     PT Education - 12/11/15 1346    Education provided Yes   Education Details PT discussed goal progress, meaning and of outcome measures and incorporating pt specific goals into new POC (which will be established next session.   Person(s) Educated Patient   Methods Explanation   Comprehension Verbalized understanding          PT Short Term Goals - 08/23/15 1203    PT SHORT TERM GOAL #1   Title Pt will be IND in HEP to improve strength, balance, and endurance. Target date: 07/27/15.   Status Partially Met   PT SHORT TERM GOAL #2   Title Pt will improve gait speed to >/=1.24f/sec to decrease falls risk. Target date: 07/27/15.   Status Achieved   PT SHORT TERM GOAL #3   Title Pt will ambulate 300' with LRAD, over even terrain, at MOD I level to improve functional mobility. Target date: 07/27/15.   Status Achieved   PT SHORT TERM GOAL #4   Title Pt will perform TUG in </=13.5 seconds with LRAD to decr. falls risk. Target date: 07/27/15.   Status Partially Met   PT SHORT TERM GOAL #5   Title Perform BERG and write STG and LTG as appropriate. Target date: 07/27/15.   Status Achieved   PT SHORT TERM GOAL #6   Title Pt will improve BERG to >/=51/56 to decr. falls risk. Target date: 07/27/15.   Status Achieved           PT Long Term Goals - 12/11/15 1350    PT LONG TERM GOAL #1   Title Pt will ambulate 1000' over even/uneven terrain, with LRAD, at MOD I level to improve functional mobiliity. Target date: 12/11/15.   Baseline deferred to new 4 week POC.  All unmet goals will be carried over to new 4 week POC: 12/11/15   Status Partially Met   PT LONG TERM GOAL #2   Title Pt will amb. 300' over even terrain without an AD, IND, to safely amb. in home. Target date: 12/11/15   Status On-going   PT LONG TERM GOAL #3   Title Pt will improve gait speed to >/=2.635fsec, with LRAD, to  amb. safely in the community. Target date: 11/12/14.   Status Achieved   PT LONG TERM GOAL #4   Title Pt will verbalize plans to join a fitness center upon d/c from PT to continue to maintain strength and endurance gains made during PT. Target date: 12/11/15.   Status On-going   PT LONG TERM GOAL #5   Title Pt improve BERG score to >/=55/56 to decr. falls risk. Target date: 12/11/15   Status Achieved   PT LONG TERM GOAL #6   Title Pt will ascend/descend curb with LRAD at MOD I level to improve functional mobility. Target date: 12/11/15   Status Partially Met   PT LONG TERM GOAL #7   Title Pt will ascend/descend 8 steps with one handrail at MOD I level to improve functional mobility. Target date: 12/11/15   Status Partially Met               Plan - 12/11/15 1348    Clinical Impression Statement Pt demonstrated progress, as she met LTG 5 and partially met LTGs 1, 6 and 7. LTG 3 was met in 10/2105. PT will assess LTG 2 next session and will have pt perform DGI and set goal if appropriate, as pt scored 55/56 on BERG balance scale today. Continue with POC.    Pt will benefit from skilled therapeutic intervention in order to improve on the following deficits Abnormal gait;Decreased endurance;Impaired sensation;Decreased knowledge of precautions;Decreased activity tolerance;Decreased knowledge of use of DME;Decreased strength;Impaired UE functional use;Impaired tone;Decreased balance;Decreased mobility;Decreased cognition;Decreased range of motion;Decreased safety awareness;Decreased coordination;Impaired flexibility   Rehab Potential Good   Clinical Impairments Affecting  Rehab Potential Seizures which may limit intensity of PT   PT Frequency 2x / week   PT Duration 4 weeks   PT Treatment/Interventions ADLs/Self Care Home Management;Neuromuscular re-education;Cognitive remediation;Biofeedback;DME Instruction;Gait training;Stair training;Canalith Repostioning;Patient/family education;Orthotic Fit/Training;Balance training;Therapeutic exercise;Manual techniques;Therapeutic activities;Vestibular   PT Next Visit Plan Finish assessing goals and renew, perform DGI and write goal.    PT Home Exercise Plan Stretching/strength/balance HEP   Consulted and Agree with Plan of Care Patient        Problem List Patient Active Problem List   Diagnosis Date Noted  . Spastic hemiplegia and hemiparesis affecting dominant side (HCC) 09/13/2015  . Adhesive capsulitis of right shoulder 08/24/2015  . Symptomatic partial epilepsy with simple partial seizures (HCC) 08/21/2015  . Nontraumatic cortical hemorrhage of cerebral hemisphere (HCC)   . Seizure (HCC)   . Seizures (HCC)   . Adjustment disorder with depressed mood   . Contracture of muscle ankle and foot 06/11/2015  . Hemiplga fol ntrm intcrbl hemor aff right dominant side 06/06/2015  . Left-sided intracerebral hemorrhage (HCC) 06/04/2015  . Seizure disorder as sequela of cerebrovascular accident (HCC)   . Seizure disorder (HCC) 06/03/2015  . Sepsis (HCC) 06/03/2015  . UTI (urinary tract infection) 06/03/2015  . Hypotension 06/02/2015  . Right spastic hemiparesis (HCC) 05/28/2015  . 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 History of anxiety disorder 05/28/2015  . ICH (intracerebral hemorrhage) (HCC)   . Seizure disorder, nonconvulsive, with status epilepticus (HCC)   . Cerebral venous thrombosis of cortical vein   . Cytotoxic cerebral edema (HCC)   . IVH (intraventricular hemorrhage) (HCC)   . Term pregnancy 05/07/2015  . Spontaneous vaginal delivery 05/07/2015    Masey Scheiber  L 12/11/2015, 1:51 PM  [facility] [address] [place], [place], [address] Phone: [phone]   Fax:  [phone]  Name:  Dasia Guerrier MRN: [id number] Date of Birth: 07-Oct-1976    Geoffry Paradise, PT,DPT 12/11/2015 1:52 PM Phone: [phone] Fax: [phone]

## 2015-12-11 NOTE — Therapy (Signed)
[facility] [address] [place], [place], [address] Phone: [phone]   Fax:  [phone]  Occupational Therapy Treatment  Patient Details  Name: Ashley [name] MRN: [id number] Date of Birth: [birth year] No Data Recorded  Encounter Date: 12/11/2015      OT End of Session - 12/11/15 1221    Visit Number 17   Date for OT Re-Evaluation 12/21/15   Authorization Type BCBS   Authorization Time Period 30 visit COMBINED between PT/OT;  pt has utilized benefit and is now self pay.    Authorization - Visit Number 17   Authorization - Number of Visits 20   OT Start Time 1105   OT Stop Time 1149   OT Time Calculation (min) 44 min   Activity Tolerance Patient tolerated treatment well      Past Medical History  Diagnosis Date  . Anxiety   . ICH (intracerebral hemorrhage) ([place])   . Stroke ([place])   . Seizures [facility])     Past Surgical History  Procedure Laterality Date  . Dilation and curettage of uterus      There were no vitals filed for this visit.  Visit Diagnosis:  Right spastic hemiparesis ([place]) - Plan: Ot plan of care cert/re-cert  Pain in joint of right shoulder - Plan: Ot plan of care cert/re-cert  Weakness due to cerebrovascular accident - Plan: Ot plan of care cert/re-cert  Stiffness of joint, shoulder region, right - Plan: Ot plan of care cert/re-cert  Poor trunk control - Plan: Ot plan of care cert/re-cert      Subjective Assessment - 12/11/15 1112    Subjective  I fastened my bra with both hands behind my back for the first time today!   Pertinent History see epic snapshot; pt with [facility] 2 weeks after deliverying her 3rd child   Patient Stated Goals to be able to use my R arm normally   Currently in Pain? No/denies                      OT Treatments/Exercises (OP) - 12/11/15 0001    Neurological Re-education Exercises   Other Exercises 1 Neuro re ed to address overhead reach  bilaterally and unilaterally in standing and sitting with and without weighted objects.  Also addressed incorporating active stretching in standing into HEP - see pt instructions for details.  Also addressed increased weight bearing on R side with active holding/stablization with mobility on left.  Pt needs mod facilitation to stabilize on RLE in standing while moving LLE for functional tasks.                 OT Education - 12/11/15 1212    Education provided Yes   Education Details HEP for RUE upgraded   Person(s) Educated Patient   Methods Explanation;Demonstration;Tactile cues;Verbal cues  also took pictures on cell phone   Comprehension Verbalized understanding;Returned demonstration          OT Short Term Goals - 12/11/15 1232    Additional Short Term Goals   Additional Short Term Goals Yes   OT SHORT TERM GOAL #8   Title Pt will retrieve a lightweight object at 100 shoulder flexion with min compensations and pain no greater than 3/10. - 11/23/2015 (4 weeks from recertification)   Status Achieved   OT SHORT TERM GOAL  #9   TITLE Pt will use RUE as an active assist for ADLs/ IADLS 70 % of the time.   Status  Achieved   OT SHORT TERM GOAL  #10   TITLE Pt will report that she is able to fasten her bra and perform toileting hygiene with greater ease.   Status Achieved   OT SHORT TERM GOAL  #11   TITLE Pt will be mod I with upgraded HEP - 01/08/2016   Status New   OT SHORT TERM GOAL  #12   TITLE Pt will be able to pick up 8 pound object x 4 trials bilaterally to place on overhead shelf requiring at least 120* of shoulder flexion (for household mgmt tasks, grocery shopping ,etc)   Status New   OT SHORT TERM GOAL  #13   TITLE Pt will be able to lift lightweight object unilaterally with RUE to 120* of shoulder flexion (baseline 115*)   Status New   OT SHORT TERM GOAL  #14   TITLE Pt will be able to lift baby into and out of tub while in kneeling from and to baby seat using  both UE's   Status New           OT Long Term Goals - 12/11/15 [place] #1   Period Weeks   OT LONG TERM GOAL #2   Title revised long term goal- modified independent with cooking and home management with pt demonstrating good safety awareness - 12/21/2015 (8 weeks from recertification)   Status Achieved   OT LONG TERM GOAL #4   Title revised goal- Pt will use RUE as an active assist 75% of the time - see updated short and long term goals 12/21/2015   Baseline pt reports using 60% of the time as active assist   Status Achieved  12/06/2015 pt reports 70% of time as either assist or dominant use   OT LONG TERM GOAL #5   Title --   Status --   Long Term Additional Goals   Additional Long Term Goals Yes   OT LONG TERM GOAL #7   Title Pt will report using RUE as an active assist 80% of the time for ADLs/ IADLS. 12/21/15   Status Partially Met  uses RUE as dominant in basic ADL's at least 80% of the time. Uses RUE as active assist in IADL's 70% of the time   OT LONG TERM GOAL #8   Title Pt will retrieve a light weight object at 110  shoulder flexion with only min compensations.   Status Achieved   OT LONG TERM GOAL  #9   Baseline I with updated HEP.   Status Achieved   OT LONG TERM GOAL  #10   TITLE Pt will be mod I with upgraded HEP - 02/05/2016   Status New   OT LONG TERM GOAL  #11   TITLE Pt will be able to pick up 5 pound object with RUE to place on overhead shelf x4 trials that requres at least 120* of shoulder flexion (for home mgmt tasks, care of child, volunteering)   Status New   OT LONG TERM GOAL  #12   TITLE Pt will be able to carry at least a 15 pound object using both hands without drops for 20 feet.   Status New   OT LONG TERM GOAL  #13   TITLE Pt will be able to get off toilet without using UE's (to allow her to use public restrooms, boat bathroom that are without grab bars)  Plan - 12/11/15 1215    Clinical Impression Statement  Pt has met all STG's and met or partially met all LTG's.  Pt has made excellent progress and has greatly improved in functional use of RUE, functional mobility, and trunk control. Pt is beginning to resume basic activities tied to life roles as mother and wife. Pt wishes to continue rehab to fully return to care of infant, role of volunteer in the schools, ability to participate in family leisure actvities and full use of RUE overhead.     Pt will benefit from skilled therapeutic intervention in order to improve on the following deficits (Retired) Abnormal gait;Decreased coordination;Decreased range of motion;Difficulty walking;Impaired flexibility;Decreased endurance;Decreased safety awareness;Increased edema;Impaired tone;Decreased knowledge of precautions;Decreased activity tolerance;Decreased balance;Decreased knowledge of use of DME;Pain;Impaired UE functional use;Impaired vision/preception;Decreased cognition;Decreased mobility;Decreased strength   Rehab Potential Good   Clinical Impairments Affecting Rehab Potential cognition, impaired sensation and anxiety   OT Frequency 2x / week   OT Duration 8 weeks   OT Treatment/Interventions Self-care/ADL training;Therapeutic exercise;Patient/family education;Balance training;Ultrasound;Neuromuscular education;Manual Therapy;Splinting;Therapeutic exercises;Energy conservation;Parrafin;DME and/or AE instruction;Therapeutic activities;Cognitive remediation/compensation;Gait Training;Fluidtherapy;Electrical Stimulation;Moist Heat;Contrast Bath;Passive range of motion;Visual/perceptual remediation/compensation   Plan NMR for bilateral and unilateral overhead reach working toward strength, trunk control in standing, sti to stand from low surfaces   Consulted and Agree with Plan of Care Patient        Problem List Patient Active Problem List   Diagnosis Date Noted  . Spastic hemiplegia and hemiparesis affecting dominant side ([place]) 09/13/2015  . Adhesive  capsulitis of right shoulder 08/24/2015  . Symptomatic partial epilepsy with simple partial seizures ([place]) 08/21/2015  . Nontraumatic cortical hemorrhage of cerebral hemisphere ([place])   . Seizure ([place])   . Seizures ([place])   . Adjustment disorder with depressed mood   . Contracture of muscle ankle and foot 06/11/2015  . Hemiplga fol ntrm intcrbl hemor aff right dominant side 06/06/2015  . Left-sided intracerebral hemorrhage ([place]) 06/04/2015  . Seizure disorder as sequela of cerebrovascular accident ([place])   . Seizure disorder ([place]) 06/03/2015  . Sepsis ([place]) 06/03/2015  . UTI (urinary tract infection) 06/03/2015  . Hypotension 06/02/2015  . Right spastic hemiparesis ([place]) 05/28/2015  . 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 History of anxiety disorder 05/28/2015  . ICH (intracerebral hemorrhage) ([place])   . Seizure disorder, nonconvulsive, with status epilepticus ([place])   . Cerebral venous thrombosis of cortical vein   . Cytotoxic cerebral edema ([place])   . IVH (intraventricular hemorrhage) ([place])   . Term pregnancy 05/07/2015  . Spontaneous vaginal delivery 05/07/2015    Quay Burow, OTR/L 12/11/2015, 12:47 PM  [facility] [address] [place], [place], [address] Phone: [phone]   Fax:  [phone]  Name: Ashley [name] MRN: [id number] Date of Birth: [birth year]

## 2015-12-11 NOTE — Patient Instructions (Signed)
Stretch for your right arm at home.  Do NOT do more than number listed. STOP if you get any shoulder pain at all.  Only work within your pain free range.  These should feel like a stretch but not painful.    1. Stand facing the wall with palms flat on wall and elbows bent. Slowly slide both hands up the wall as far as you can (elbows should be straight when you get there!). RELAX into the stretch and make sure you in the middle.  HOLD this stretch for a slow count of 20 then slide hands back to starting position. Start with 10 a day.  2. Stand with the wall to your right with your right hand on the wall. Stand far enough away that your elbow can be straight.  Keep hand slightly lower than your shoulder, feet apart with left foot turned out slightly. Slowly and gently turn your body to the left until you feel a stretch across the front of your shoulder.  This should NOT be painful.  HOLD for slow count of 5 and then turn straight.  Do 10 a day.  If any of these cause pain, STOP IMMEDIATELY and let me know!

## 2015-12-13 ENCOUNTER — Ambulatory Visit (HOSPITAL_BASED_OUTPATIENT_CLINIC_OR_DEPARTMENT_OTHER): Payer: BLUE CROSS/BLUE SHIELD | Admitting: Physical Medicine & Rehabilitation

## 2015-12-13 ENCOUNTER — Encounter: Payer: BLUE CROSS/BLUE SHIELD | Attending: Physical Medicine & Rehabilitation

## 2015-12-13 ENCOUNTER — Ambulatory Visit: Payer: BLUE CROSS/BLUE SHIELD | Attending: Physical Medicine & Rehabilitation | Admitting: Occupational Therapy

## 2015-12-13 ENCOUNTER — Ambulatory Visit: Payer: BLUE CROSS/BLUE SHIELD

## 2015-12-13 ENCOUNTER — Encounter: Payer: Self-pay | Admitting: Occupational Therapy

## 2015-12-13 ENCOUNTER — Encounter: Payer: Self-pay | Admitting: Physical Medicine & Rehabilitation

## 2015-12-13 VITALS — BP 91/57 | HR 79 | Resp 14

## 2015-12-13 DIAGNOSIS — F419 Anxiety disorder, unspecified: Secondary | ICD-10-CM | POA: Insufficient documentation

## 2015-12-13 DIAGNOSIS — R2991 Unspecified symptoms and signs involving the musculoskeletal system: Secondary | ICD-10-CM | POA: Insufficient documentation

## 2015-12-13 DIAGNOSIS — G8110 Spastic hemiplegia affecting unspecified side: Secondary | ICD-10-CM | POA: Diagnosis not present

## 2015-12-13 DIAGNOSIS — R2689 Other abnormalities of gait and mobility: Secondary | ICD-10-CM | POA: Insufficient documentation

## 2015-12-13 DIAGNOSIS — R269 Unspecified abnormalities of gait and mobility: Secondary | ICD-10-CM | POA: Diagnosis present

## 2015-12-13 DIAGNOSIS — R531 Weakness: Secondary | ICD-10-CM | POA: Diagnosis present

## 2015-12-13 DIAGNOSIS — R278 Other lack of coordination: Secondary | ICD-10-CM

## 2015-12-13 DIAGNOSIS — G8111 Spastic hemiplegia affecting right dominant side: Secondary | ICD-10-CM

## 2015-12-13 DIAGNOSIS — F321 Major depressive disorder, single episode, moderate: Secondary | ICD-10-CM | POA: Insufficient documentation

## 2015-12-13 DIAGNOSIS — R252 Cramp and spasm: Secondary | ICD-10-CM

## 2015-12-13 DIAGNOSIS — I611 Nontraumatic intracerebral hemorrhage in hemisphere, cortical: Secondary | ICD-10-CM | POA: Diagnosis present

## 2015-12-13 DIAGNOSIS — R279 Unspecified lack of coordination: Secondary | ICD-10-CM

## 2015-12-13 DIAGNOSIS — I69151 Hemiplegia and hemiparesis following nontraumatic intracerebral hemorrhage affecting right dominant side: Secondary | ICD-10-CM | POA: Insufficient documentation

## 2015-12-13 DIAGNOSIS — M25511 Pain in right shoulder: Secondary | ICD-10-CM

## 2015-12-13 DIAGNOSIS — R29898 Other symptoms and signs involving the musculoskeletal system: Secondary | ICD-10-CM | POA: Insufficient documentation

## 2015-12-13 DIAGNOSIS — Z79899 Other long term (current) drug therapy: Secondary | ICD-10-CM | POA: Insufficient documentation

## 2015-12-13 DIAGNOSIS — M25611 Stiffness of right shoulder, not elsewhere classified: Secondary | ICD-10-CM | POA: Insufficient documentation

## 2015-12-13 DIAGNOSIS — R258 Other abnormal involuntary movements: Secondary | ICD-10-CM | POA: Insufficient documentation

## 2015-12-13 DIAGNOSIS — I69898 Other sequelae of other cerebrovascular disease: Secondary | ICD-10-CM | POA: Insufficient documentation

## 2015-12-13 DIAGNOSIS — I69120 Aphasia following nontraumatic intracerebral hemorrhage: Secondary | ICD-10-CM | POA: Insufficient documentation

## 2015-12-13 DIAGNOSIS — IMO0002 Reserved for concepts with insufficient information to code with codable children: Secondary | ICD-10-CM

## 2015-12-13 NOTE — Therapy (Signed)
[facility] [address] [place], [place], [address] Phone: [phone]   Fax:  [phone]  Physical Therapy Treatment  Patient Details  Name: Ashley [name] MRN: [id number] Date of Birth: 1976/05/19 No Data Recorded  Encounter Date: 12/13/2015      PT End of Session - 12/13/15 1140    Visit Number 45   Number of Visits 47  plus 16 more visits with new POC (02/11/16)   Date for PT Re-Evaluation 02/11/16   Authorization Type BCBS. Per Laverda Page, visits now 30 for PT and OT total (15 per OT and 15 per PT). Pt wishes to continue at self pay after visit limit met.    Authorization - Visit Number 18   Authorization - Number of Visits 15   PT Start Time [phone]   PT Stop Time 1014   PT Time Calculation (min) 40 min   Equipment Utilized During Treatment Other (comment)  BWSTT harness   Activity Tolerance Patient tolerated treatment well   Behavior During Therapy WFL for tasks assessed/performed      Past Medical History  Diagnosis Date  . Anxiety   . ICH (intracerebral hemorrhage) ([place])   . Stroke ([place])   . Seizures [facility])     Past Surgical History  Procedure Laterality Date  . Dilation and curettage of uterus      There were no vitals filed for this visit.  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 She said it was a little challenging to focus but feels happy that she did it. No falls since last visit.   Pertinent History Seizures, low BP   Patient Stated Goals "Be as close to normal as much as possible and to go back to running" Get back to taking care of my kids, 108 weeks old  and 79 and 79 years old   Currently in Pain? No/denies                         [facility] Adult PT Treatment/Exercise - 12/13/15 0939    Ambulation/Gait   Ambulation/Gait Yes   Ambulation/Gait Assistance 5: Supervision;4: Min guard;Other (comment)   Ambulation/Gait Assistance Details Pt performed 3 bouts of 6 minutes on BWSTT (1.8mh) with tactile and verbal cues to improve R hip protraction decr. lateral postural sway to R side. Pt also amb. over even terrain with RW and B UEs placed on PTs shoulder with cues to improve L step length, R hip protraction and forward wt. shifting onto R LE.   Ambulation Distance (Feet) --  0.160mesx3 on BWSTT, 5030RW and 100' with UE support on PT   Assistive device Rolling walker;None;Body weight support system   Gait Pattern Decreased stance time - right;Decreased weight shift to right;Right foot flat;Decreased hip/knee flexion - right;Step-through pattern;Decreased step length - left;Trunk rotated posteriorly on right   Ambulation Surface Level;Indoor                  PT Short Term Goals - 12/13/15 1146    PT SHORT TERM GOAL #1   Title Pt will  be IND in HEP to improve strength, balance, and endurance. Target date: 07/27/15.   Status Partially Met   PT SHORT TERM GOAL #2   Title Pt will improve gait speed to >/=1.13f/sec to decrease falls risk. Target date: 07/27/15.   Status Achieved   PT SHORT TERM GOAL #3   Title Pt will ambulate 300' with LRAD, over even terrain, at MOD I level to improve functional mobility. Target date: 07/27/15.   Status Achieved   PT SHORT TERM GOAL #4   Title Pt will perform TUG in </=13.5 seconds with LRAD to decr. falls risk. Target date: 07/27/15.   Status Partially Met   PT SHORT TERM GOAL #5   Title Perform BERG and write STG and LTG as appropriate. Target date: 07/27/15.   Status Achieved   Additional Short Term Goals   Additional Short Term Goals Yes   PT SHORT TERM GOAL #6   Title Pt will  improve BERG to >/=51/56 to decr. falls risk. Target date: 07/27/15.   Status Achieved   PT SHORT TERM GOAL #7   Title Pt will amb. 500' over even/uneven terrain with LRAD at MOD I level to improve functional mobility. Target date: 01/10/16   Status New   PT SHORT TERM GOAL #8   Title Pt will ascend/descend curb with LRAD at MOD I level to improve functional mobility. Target date: 01/10/16   Status New   PT SHORT TERM GOAL #9   TITLE Pt will ascend/descend 12 steps with one handrail at MOD I level to improve functional mobility. Target date: 01/10/16   Status New   PT SHORT TERM GOAL #10   TITLE Perform DGI and write goals if appropriate. Target date: 01/10/16   Status New           PT Long Term Goals - 12/13/15 1148    PT LONG TERM GOAL #1   Title Pt will ambulate 1000' over even/uneven terrain, with LRAD, at MOD I level to improve functional mobiliity. Target date: 12/11/15.   Baseline All unmet goals will be carried over to new 8 week POC: 02/07/16   Status Partially Met   PT LONG TERM GOAL #2   Title Pt will amb. 300' over even terrain without an AD, IND, to safely amb. in home. Target date: 12/11/15   Status On-going   PT LONG TERM GOAL #3   Title Pt will improve gait speed to >/=2.691fsec, with LRAD, to amb. safely in the community. Target date: 11/12/14.   Status Achieved   PT LONG TERM GOAL #4   Title Pt will verbalize plans to join a fitness center upon d/c from PT to continue to maintain strength and endurance gains made during PT. Target date: 12/11/15.   Status On-going   PT LONG TERM GOAL #5   Title Pt improve BERG score to >/=55/56 to decr. falls risk. Target date: 12/11/15   Status Achieved   Additional Long Term Goals   Additional Long Term Goals Yes   PT LONG TERM GOAL #6   Title Pt will ascend/descend curb with LRAD at MOD I level to improve functional mobility. Target date: 12/11/15   Status Partially Met   PT LONG TERM GOAL #7   Title Pt will ascend/descend 8  steps with one handrail at MOD I level to improve functional mobility. Target date: 12/11/15   Status Partially Met   PT LONG TERM GOAL #8   Title Pt will ascend/descend ladder at MOD I level  in order to transfer in/out of water. Target date: 02/07/16   Status New   PT LONG TERM GOAL  #9   TITLE Pt will be able to transfer on/off boat at MOD I level, and no LOB, in order improve functional mobility and participate in family activities. Target date: 02/07/16   Status New   PT LONG TERM GOAL  #10   TITLE Pt will be IIND in progressed HEP in order to improve balance, strength and flexibility. Target date: 02/07/16   Status New               Plan - 12/13/15 1141    Clinical Impression Statement Pt demonstrating progress as she continues to require less cues while amb. with RW and is able to improve L step length and B heel strike. Pt's endurance is improving as she required standing rest breaks only, after each bout of amb. Pt would continue to benefit from skilled PT to improve safety during functional mobility. PT requesting additional 2x/week for 8 weeks. PT discussed renewal, goals and pt progress with pt's OT in order for both disciplines to incorporate activities to maximal functional gains for pt.    Pt will benefit from skilled therapeutic intervention in order to improve on the following deficits Abnormal gait;Decreased endurance;Impaired sensation;Decreased knowledge of precautions;Decreased activity tolerance;Decreased knowledge of use of DME;Decreased strength;Impaired UE functional use;Impaired tone;Decreased balance;Decreased mobility;Decreased cognition;Decreased range of motion;Decreased safety awareness;Decreased coordination;Impaired flexibility   Rehab Potential Good   Clinical Impairments Affecting Rehab Potential Seizures which may limit intensity of PT   PT Frequency 2x / week   PT Duration 8 weeks   PT Treatment/Interventions ADLs/Self Care Home Management;Neuromuscular  re-education;Cognitive remediation;Biofeedback;DME Instruction;Gait training;Stair training;Canalith Repostioning;Patient/family education;Orthotic Fit/Training;Balance training;Therapeutic exercise;Manual techniques;Therapeutic activities;Vestibular   PT Next Visit Plan Perform DGI and write goal, amb without RW.   PT Home Exercise Plan Stretching/strength/balance HEP   Consulted and Agree with Plan of Care Patient   Family Member Consulted pt's husband [name] present at end of session        Problem List Patient Active Problem List   Diagnosis Date Noted  . Spastic hemiplegia and hemiparesis affecting dominant side ([place]) 09/13/2015  . Adhesive capsulitis of right shoulder 08/24/2015  . Symptomatic partial epilepsy with simple partial seizures ([place]) 08/21/2015  . Nontraumatic cortical hemorrhage of cerebral hemisphere ([place])   . Seizure ([place])   . Seizures ([place])   . Adjustment disorder with depressed mood   . Contracture of muscle ankle and foot 06/11/2015  . Hemiplga fol ntrm intcrbl hemor aff right dominant side 06/06/2015  . Left-sided intracerebral hemorrhage ([place]) 06/04/2015  . Seizure disorder as sequela of cerebrovascular accident ([place])   . Seizure disorder ([place]) 06/03/2015  . Sepsis ([place]) 06/03/2015  . UTI (urinary tract infection) 06/03/2015  . Hypotension 06/02/2015  . Right spastic hemiparesis ([place]) 05/28/2015  . 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 History of anxiety disorder 05/28/2015  . ICH (intracerebral hemorrhage) ([place])   . Seizure disorder, nonconvulsive, with status epilepticus ([place])   . Cerebral venous thrombosis of cortical vein   . Cytotoxic cerebral edema ([place])   . IVH (intraventricular hemorrhage) ([place])   . Term pregnancy 05/07/2015  . Spontaneous vaginal delivery 05/07/2015    Thadeus Gandolfi L 12/13/2015, 12:01 PM  [facility] [address] [place],  [place], [address] Phone: [phone]   Fax:  [phone]  Name: Ashley [name] MRN: [id number] Date of Birth: [birth year]  Geoffry Paradise, PT,DPT 12/13/2015 12:02 PM Phone: [phone] Fax: [phone]

## 2015-12-13 NOTE — Therapy (Signed)
[facility] [address] [place], [place], [address] Phone: [phone]   Fax:  [phone]  Occupational Therapy Treatment  Patient Details  Name: Ashley [name] MRN: [id number] Date of Birth: [birth year] No Data Recorded  Encounter Date: 12/13/2015      OT End of Session - 12/13/15 1144    Visit Number 18   Number of Visits 33   Date for OT Re-Evaluation 02/05/16   Authorization Type BCBS   Authorization Time Period 30 visit COMBINED between PT/OT;  pt has utilized benefit and is now self pay.    Authorization - Visit Number 18   Authorization - Number of Visits 9   OT Start Time 0845   OT Stop Time 0931   OT Time Calculation (min) 46 min   Activity Tolerance Patient tolerated treatment well      Past Medical History  Diagnosis Date  . Anxiety   . ICH (intracerebral hemorrhage) ([place])   . Stroke ([place])   . Seizures [facility])     Past Surgical History  Procedure Laterality Date  . Dilation and curettage of uterus      There were no vitals filed for this visit.  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  Neuro re ed to  address overhead bilateral and unilateral reach with resistance - pt is able to achieve overhead reach from AROM standpoint however has very little strength in this plane for functional tasks. Also reviewed new goals with patient and pt in agreement (PT and OT collaborated for goal development.                   OT Short Term Goals - 12/13/15 1139    OT SHORT TERM GOAL #8   Title --   Status --   OT SHORT TERM GOAL  #11   TITLE Pt will be mod I with upgraded HEP - 01/08/2016   Status New   OT SHORT TERM GOAL  #12   TITLE Pt will be able to pick up 8 pound object x 4 trials bilaterally to place on overhead shelf requiring at least 120* of shoulder flexion (for household mgmt tasks, grocery shopping ,etc)   Status New   OT SHORT TERM GOAL  #13   TITLE Pt will be able to lift lightweight object unilaterally with RUE to 120* of shoulder flexion (baseline 115*)   Status New   OT SHORT TERM GOAL  #14   TITLE Pt will be able to lift baby into and out  of tub while in kneeling from and to baby seat using both UE's   Status New           OT Long Term Goals - 12/13/15 1140    OT LONG TERM GOAL #1   Period --   OT LONG TERM GOAL #4   Baseline pt reports using 60% of the time as active assist   OT LONG TERM GOAL #7   Status Partially Met   OT LONG TERM GOAL  #9   Baseline I with updated HEP.   Status Achieved   OT LONG TERM GOAL  #10   TITLE Pt will be mod I with upgraded HEP - 02/05/2016   Status New   OT LONG TERM GOAL  #11   TITLE Pt will be able to pick up 5 pound object with RUE to place on overhead shelf x4 trials that requres at least 120* of shoulder flexion (for home mgmt tasks, care of child, volunteering)   Status New   OT LONG TERM GOAL  #12   TITLE Pt will be able to carry at least a 15 pound object using both hands without drops for 20 feet.   Status New   OT LONG TERM GOAL  #13   TITLE Pt will be able to get off toilet without using UE's (to allow her to use  public restrooms, boat bathroom that are without grab bars)               Plan - 12/13/15 1142    Clinical Impression Statement Pt continues to make steady progress toward goals. 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 New goals reviewed and pt in agreement.   Pt will benefit from skilled therapeutic intervention in order to improve on the following deficits (Retired) Abnormal gait;Decreased coordination;Decreased range of motion;Difficulty walking;Impaired flexibility;Decreased endurance;Decreased safety awareness;Increased edema;Impaired tone;Decreased knowledge of precautions;Decreased activity tolerance;Decreased balance;Decreased knowledge of use of DME;Pain;Impaired UE functional use;Impaired vision/preception;Decreased cognition;Decreased mobility;Decreased strength   Rehab Potential Good   Clinical Impairments Affecting Rehab Potential cognition, impaired sensation and anxiety   OT Frequency 2x / week   OT Duration 8 weeks   OT Treatment/Interventions Self-care/ADL training;Therapeutic exercise;Patient/family education;Balance training;Ultrasound;Neuromuscular education;Manual Therapy;Splinting;Therapeutic exercises;Energy conservation;Parrafin;DME and/or AE instruction;Therapeutic activities;Cognitive remediation/compensation;Gait Training;Fluidtherapy;Electrical Stimulation;Moist Heat;Contrast Bath;Passive range of motion;Visual/perceptual remediation/compensation   Plan NMR for bilateral and unilateral reach working toward strength, trunk control, sit to stand and stand to sit from lower surfaces, transitional movement, alignment   Consulted and Agree with Plan of Care Patient        Problem List Patient Active Problem List   Diagnosis Date Noted  . Spastic hemiplegia and hemiparesis affecting dominant side ([place]) 09/13/2015  . Adhesive capsulitis of right shoulder 08/24/2015  . Symptomatic partial epilepsy with simple partial  seizures ([place]) 08/21/2015  . Nontraumatic cortical hemorrhage of cerebral hemisphere ([place])   . Seizure ([place])   . Seizures ([place])   . Adjustment disorder with depressed mood   . Contracture of muscle ankle and foot 06/11/2015  . Hemiplga fol ntrm intcrbl hemor aff right dominant side 06/06/2015  . Left-sided intracerebral hemorrhage ([place]) 06/04/2015  . Seizure disorder as sequela of cerebrovascular accident ([place])   . Seizure disorder ([place]) 06/03/2015  . Sepsis ([place]) 06/03/2015  . UTI (urinary tract infection) 06/03/2015  . Hypotension 06/02/2015  . Right spastic hemiparesis ([place]) 05/28/2015  . 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 History of anxiety disorder  05/28/2015  . ICH (intracerebral hemorrhage) ([place])   . Seizure disorder, nonconvulsive, with status epilepticus ([place])   . Cerebral venous thrombosis of cortical vein   . Cytotoxic cerebral edema ([place])   . IVH (intraventricular hemorrhage) ([place])   . Term pregnancy 05/07/2015  . Spontaneous vaginal delivery 05/07/2015    Quay Burow, OTR/L 12/13/2015, 11:48 AM  [facility] [address] [place], [place], [address] Phone: [phone]   Fax:  [phone]  Name: Ashley [name] MRN: [id number] Date of Birth: Mar 08, 1976

## 2015-12-13 NOTE — Progress Notes (Signed)
   Subjective:    Patient ID: Ashley [name], female    DOB: 1976/02/25, 40 y.o.   MRN: [id number]  HPI    Review of Systems     Objective:   Physical Exam        Assessment & Plan:

## 2015-12-13 NOTE — Progress Notes (Signed)
Botox Injection for spasticity using needle EMG guidance  Dilution: 50 Units/ml Indication: Severe spasticity which interferes with ADL,mobility and/or  hygiene and is unresponsive to medication management and other conservative care Informed consent was obtained after describing risks and benefits of the procedure with the patient. This includes bleeding, bruising, infection, excessive weakness, or medication side effects. A REMS form is on file and signed. Needle: 40mm 25g needle electrode Number of units per muscle  Gastrosoleus 50 units medial and 50 units lateral Soleus 25 units 3 Posterior tibialis 25 units 3 Flexor digitorum longus 25 units 2 All injections were done after obtaining appropriate EMG activity and after negative drawback for blood. The patient tolerated the procedure well. Post procedure instructions were given. A followup appointment was made.

## 2015-12-13 NOTE — Patient Instructions (Signed)

## 2015-12-18 ENCOUNTER — Encounter: Payer: Self-pay | Admitting: Occupational Therapy

## 2015-12-18 ENCOUNTER — Ambulatory Visit: Payer: BLUE CROSS/BLUE SHIELD | Admitting: Occupational Therapy

## 2015-12-18 DIAGNOSIS — IMO0002 Reserved for concepts with insufficient information to code with codable children: Secondary | ICD-10-CM

## 2015-12-18 DIAGNOSIS — G8111 Spastic hemiplegia affecting right dominant side: Secondary | ICD-10-CM | POA: Diagnosis not present

## 2015-12-18 DIAGNOSIS — R2991 Unspecified symptoms and signs involving the musculoskeletal system: Secondary | ICD-10-CM

## 2015-12-18 DIAGNOSIS — M25611 Stiffness of right shoulder, not elsewhere classified: Secondary | ICD-10-CM

## 2015-12-18 DIAGNOSIS — M25511 Pain in right shoulder: Secondary | ICD-10-CM

## 2015-12-18 DIAGNOSIS — R29818 Other symptoms and signs involving the nervous system: Secondary | ICD-10-CM

## 2015-12-18 NOTE — Therapy (Signed)
[facility] [address] [place], [place], [address] Phone: [phone]   Fax:  [phone]  Occupational Therapy Treatment  Patient Details  Name: Ashley [name] MRN: [id number] Date of Birth: Jan 21, 1976 No Data Recorded  Encounter Date: 12/18/2015      OT End of Session - 12/18/15 1322    Visit Number 19   Number of Visits 33   Date for OT Re-Evaluation 02/05/16   Authorization Type BCBS   Authorization Time Period 30 visit COMBINED between PT/OT;  pt has utilized benefit and is now self pay.    Authorization - Visit Number 19   Authorization - Number of Visits 33   OT Start Time 1102   OT Stop Time 1145   OT Time Calculation (min) 43 min   Activity Tolerance Patient tolerated treatment well      Past Medical History  Diagnosis Date  . Anxiety   . ICH (intracerebral hemorrhage) ([place])   . Stroke ([place])   . Seizures [facility])     Past Surgical History  Procedure Laterality Date  . Dilation and curettage of uterus      There were no vitals filed for this visit.  Visit Diagnosis:  Right spastic hemiparesis (HCC)  Pain in joint of right shoulder  Weakness due to cerebrovascular accident  Stiffness of joint, shoulder region, right  Poor trunk control      Subjective Assessment - 12/18/15 1105    Subjective  I went to a lacrosse game this weekend   Pertinent History see epic snapshot; pt with SAH 2 weeks after deliverying her 3rd child   Patient Stated Goals to be able to use my R arm normally   Currently in Pain? No/denies                      OT Treatments/Exercises (OP) - 12/18/15 0001    Neurological Re-education Exercises   Other Exercises 1 Simulated activity of lifting baby in and out of tub into baby seat in kneeling.  Pt with good control of RUE however very poor trunk control  to lift 14 pounds and rotate and place. Neuro re ed to address core strengthening in kneeling and  supine for isotonic as well as isometric activities.                    OT Short Term Goals - 12/18/15 1319    OT SHORT TERM GOAL  #11   TITLE Pt will be mod I with upgraded HEP - 01/08/2016   Status On-going   OT SHORT TERM GOAL  #12   TITLE Pt will be able to pick up 8 pound object x 4 trials bilaterally to place on overhead shelf requiring at least 120* of shoulder flexion (for household mgmt tasks, grocery shopping ,etc)   Status On-going   OT SHORT TERM GOAL  #13   TITLE Pt will be able to lift lightweight object unilaterally with RUE to 120* of shoulder flexion (baseline 115*)   Status On-going   OT SHORT TERM GOAL  #14   TITLE Pt will be able to lift baby into and out of tub while in kneeling from and to baby seat using both UE's   Status On-going           OT Long Term Goals - 12/18/15 1319    OT LONG TERM GOAL #4   Baseline pt reports using 60% of the time as active  assist   OT LONG TERM GOAL #7   Status Partially Met   OT LONG TERM GOAL  #9   Baseline I with updated HEP.   Status Achieved   OT LONG TERM GOAL  #10   TITLE Pt will be mod I with upgraded HEP - 02/05/2016   Status On-going   OT LONG TERM GOAL  #11   TITLE Pt will be able to pick up 5 pound object with RUE to place on overhead shelf x4 trials that requres at least 120* of shoulder flexion (for home mgmt tasks, care of child, volunteering)   Status On-going   OT LONG TERM GOAL  #12   TITLE Pt will be able to carry at least a 15 pound object using both hands without drops for 20 feet.   Status On-going   OT LONG TERM GOAL  #13   TITLE Pt will be able to get off toilet without using UE's (to allow her to use public restrooms, boat bathroom that are without grab bars)   Status On-going               Plan - 12/18/15 1320    Clinical Impression Statement Pt continues to make excellent progress toward goals. Pt's affect seems brighter and pt beginning to resume some activities.    Pt will  benefit from skilled therapeutic intervention in order to improve on the following deficits (Retired) Abnormal gait;Decreased coordination;Decreased range of motion;Difficulty walking;Impaired flexibility;Decreased endurance;Decreased safety awareness;Increased edema;Impaired tone;Decreased knowledge of precautions;Decreased activity tolerance;Decreased balance;Decreased knowledge of use of DME;Pain;Impaired UE functional use;Impaired vision/preception;Decreased cognition;Decreased mobility;Decreased strength   Rehab Potential Good   Clinical Impairments Affecting Rehab Potential cognition, impaired sensation and anxiety   OT Frequency 2x / week   OT Duration 8 weeks   OT Treatment/Interventions Self-care/ADL training;Therapeutic exercise;Patient/family education;Balance training;Ultrasound;Neuromuscular education;Manual Therapy;Splinting;Therapeutic exercises;Energy conservation;Parrafin;DME and/or AE instruction;Therapeutic activities;Cognitive remediation/compensation;Gait Training;Fluidtherapy;Electrical Stimulation;Moist Heat;Contrast Bath;Passive range of motion;Visual/perceptual remediation/compensation   Plan NMR for bilateral and unilateral reach woriking toward strength with overhead reach, trunk control, sit to stand and stand to sit from lower surfaces, transitional movements, alignment   Consulted and Agree with Plan of Care Patient        Problem List Patient Active Problem List   Diagnosis Date Noted  . Spastic hemiplegia and hemiparesis affecting dominant side ([place]) 09/13/2015  . Adhesive capsulitis of right shoulder 08/24/2015  . Symptomatic partial epilepsy with simple partial seizures ([place]) 08/21/2015  . Nontraumatic cortical hemorrhage of cerebral hemisphere ([place])   . Seizure ([place])   . Seizures ([place])   . Adjustment disorder with depressed mood   . Contracture of muscle ankle and foot 06/11/2015  . Hemiplga fol ntrm intcrbl hemor aff right dominant side 06/06/2015  .  Left-sided intracerebral hemorrhage ([place]) 06/04/2015  . Seizure disorder as sequela of cerebrovascular accident ([place])   . Seizure disorder ([place]) 06/03/2015  . Sepsis ([place]) 06/03/2015  . UTI (urinary tract infection) 06/03/2015  . Hypotension 06/02/2015  . Right spastic hemiparesis ([place]) 05/28/2015  . 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 History of anxiety disorder 05/28/2015  . ICH (intracerebral hemorrhage) ([place])   . Seizure disorder, nonconvulsive, with status epilepticus ([place])   . Cerebral venous thrombosis of cortical vein   . Cytotoxic cerebral edema ([place])   . IVH (intraventricular hemorrhage) ([place])   . Term pregnancy 05/07/2015  . Spontaneous vaginal delivery 05/07/2015    Quay Burow, OTR/L 12/18/2015, 1:23 PM  [facility]  [address] [place], [place], [address] Phone: [phone]   Fax:  [phone]  Name: Ashley [name] MRN: [id number] Date of Birth: 08-30-1976

## 2015-12-20 ENCOUNTER — Ambulatory Visit: Payer: BLUE CROSS/BLUE SHIELD

## 2015-12-20 DIAGNOSIS — G8111 Spastic hemiplegia affecting right dominant side: Secondary | ICD-10-CM | POA: Diagnosis not present

## 2015-12-20 DIAGNOSIS — R269 Unspecified abnormalities of gait and mobility: Secondary | ICD-10-CM

## 2015-12-20 NOTE — Therapy (Signed)
[facility] [address] [place], [place], [address] Phone: [phone]   Fax:  [phone]  Physical Therapy Treatment  Patient Details  Name: Ashley [name] MRN: [id number] Date of Birth: 11-22-1975 No Data Recorded  Encounter Date: 12/20/2015      PT End of Session - 12/20/15 1136    Visit Number 46   Number of Visits 63   Date for PT Re-Evaluation 02/11/16   Authorization Type BCBS. Per Conan Bowens, visits now 30 for PT and OT total (15 per OT and 15 per PT). Pt wishes to continue at self pay after visit limit met.    Authorization - Visit Number 19   Authorization - Number of Visits 15   PT Start Time 0932   PT Stop Time 1014   PT Time Calculation (min) 42 min   Equipment Utilized During Treatment Gait belt   Activity Tolerance Patient tolerated treatment well   Behavior During Therapy WFL for tasks assessed/performed      Past Medical History  Diagnosis Date  . Anxiety   . ICH (intracerebral hemorrhage) (HCC)   . Stroke (HCC)   . Seizures [facility])     Past Surgical History  Procedure Laterality Date  . Dilation and curettage of uterus      There were no vitals filed for this visit.  Visit Diagnosis:  Abnormality of gait      Subjective Assessment - 12/20/15 0934    Subjective Pt denied falls or changes since last visit.    Patient is accompained by: Family member   Pertinent History Seizures, low BP   Patient Stated Goals "Be as close to normal as much as possible and to go back to running" Get back to taking care of my kids, 50 weeks old and 21 and 5 years old   Currently in Pain? No/denies                         [facility] Adult PT Treatment/Exercise - 12/20/15 0936    Ambulation/Gait   Ambulation/Gait Yes   Ambulation/Gait Assistance 4: Min guard;5: Supervision   Ambulation/Gait Assistance Details Pt amb. over even terrain with and without SPC and RW. Cues to improve heel strike, L  step length, R hip protraction and to decr. R UE flexion. Pt amb. in // bars and outside of bars. Pt amb. with SPC in R hand to improve RLE wt. bearing.   Ambulation Distance (Feet) 345 Feet  100', 10x7'   Assistive device Rolling walker;Straight cane;Parallel bars   Gait Pattern Decreased stance time - right;Decreased weight shift to right;Right foot flat;Decreased hip/knee flexion - right;Step-through pattern;Decreased step length - left;Trunk rotated posteriorly on right   Ambulation Surface Level;Indoor   Pre-Gait Activities In //bars with min guard to S with 0-1 UE support: pt performed ant. stepping over' ruler and 1/2" foam beam to encourage improve R foot placement and ant. wt. shifting. Cues for technique (tactile and VC's).   Standardized Balance Assessment   Standardized Balance Assessment Dynamic Gait Index   Dynamic Gait Index   Level Surface Mild Impairment   Change in Gait Speed Mild Impairment   Gait with Horizontal Head Turns Mild Impairment   Gait with Vertical Head Turns Mild Impairment   Gait and Pivot Turn Moderate Impairment   Step Over Obstacle Moderate Impairment   Step Around Obstacles Normal   Steps Moderate Impairment  ascend with 1 UE, descend 2 UE: alternating  pattern   Total Score 14                PT Education - 12/20/15 1135    Education provided Yes   Education Details PT discussed DGI results.   Person(s) Educated Patient   Methods Explanation   Comprehension Verbalized understanding          PT Short Term Goals - 12/20/15 1137    PT SHORT TERM GOAL #1   Title Pt will be IND in HEP to improve strength, balance, and endurance. Target date: 01/10/16.   Status On-going   PT SHORT TERM GOAL #2   Title Pt will improve gait speed to >/=1.61f/sec to decrease falls risk. Target date: 07/27/15.   Status Achieved   PT SHORT TERM GOAL #3   Title Pt will ambulate 300' with LRAD, over even terrain, at MOD I level to improve functional mobility.  Target date: 07/27/15.   Status Achieved   PT SHORT TERM GOAL #4   Title Pt will perform TUG in </=13.5 seconds with LRAD to decr. falls risk. Target date: 01/10/15.   Status On-going   PT SHORT TERM GOAL #5   Title Perform BERG and write STG and LTG as appropriate. Target date: 07/27/15.   Status Achieved   PT SHORT TERM GOAL #6   Title Pt will improve BERG to >/=51/56 to decr. falls risk. Target date: 07/27/15.   Status Achieved   PT SHORT TERM GOAL #7   Title Pt will amb. 500' over even/uneven terrain with LRAD at MOD I level to improve functional mobility. Target date: 01/10/16   Status On-going   PT SHORT TERM GOAL #8   Title Pt will ascend/descend curb with LRAD at MOD I level to improve functional mobility. Target date: 01/10/16   Status On-going   PT SHORT TERM GOAL #9   TITLE Pt will ascend/descend 12 steps with one handrail at MOD I level to improve functional mobility. Target date: 01/10/16   Status On-going   PT SHORT TERM GOAL #10   TITLE Pt will incr. DGI score to >/=17/24 to decr. falls risk. Target date: 01/10/16   Baseline Revised on 12/20/15, as pt scored 14/24 on DGI.   Status Revised           PT Long Term Goals - 12/20/15 1138    PT LONG TERM GOAL #1   Title Pt will ambulate 1000' over even/uneven terrain, with LRAD, at MOD I level to improve functional mobiliity. Target date: 02/07/16/17.   Baseline All unmet goals will be carried over to new 8 week POC: 02/07/16   Status On-going   PT LONG TERM GOAL #2   Title Pt will amb. 300' over even terrain without an AD, IND, to safely amb. in home. Target date: 02/07/16   Status On-going   PT LONG TERM GOAL #3   Title Pt will improve gait speed to >/=2.670fsec, with LRAD, to amb. safely in the community. Target date: 11/12/14.   Status Achieved   PT LONG TERM GOAL #4   Title Pt will verbalize plans to join a fitness center upon d/c from PT to continue to maintain strength and endurance gains made during PT. Target date:  02/07/16.   Status On-going   PT LONG TERM GOAL #5   Title Pt improve BERG score to >/=55/56 to decr. falls risk. Target date: 12/11/15   Status Achieved   PT LONG TERM GOAL #6   Title Pt will ascend/descend curb  with LRAD at MOD I level to improve functional mobility. Target date: 02/07/16   Status On-going   PT LONG TERM GOAL #7   Title Pt will ascend/descend 8 steps with one handrail at MOD I level to improve functional mobility. Target date: 02/07/16   Status On-going   PT LONG TERM GOAL #8   Title Pt will ascend/descend ladder at MOD I level in order to transfer in/out of water. Target date: 02/07/16   Status On-going   PT LONG TERM GOAL  #9   TITLE Pt will be able to transfer on/off boat at MOD I level, and no LOB, in order improve functional mobility and participate in family activities. Target date: 02/07/16   Status On-going   PT LONG TERM GOAL  #10   TITLE Pt will be IIND in progressed HEP in order to improve balance, strength and flexibility. Target date: 02/07/16   Status On-going   PT LONG TERM GOAL  #11   TITLE Pt will improve DGI score to >/=20/24 to decr. falls risk. Target date: 02/07/16   Status New               Plan - 12/20/15 1136    Clinical Impression Statement Pt's score of 14/24 on DGI indicates pt is at risk for falls. Pt demonstrated progress as she was able to amb. over even terrain with less assist. Pt demonstrated fluid gait pattern without SPC vs. with SPC. Continue with POC.    Pt will benefit from skilled therapeutic intervention in order to improve on the following deficits Abnormal gait;Decreased endurance;Impaired sensation;Decreased knowledge of precautions;Decreased activity tolerance;Decreased knowledge of use of DME;Decreased strength;Impaired UE functional use;Impaired tone;Decreased balance;Decreased mobility;Decreased cognition;Decreased range of motion;Decreased safety awareness;Decreased coordination;Impaired flexibility   Rehab Potential Good    Clinical Impairments Affecting Rehab Potential Seizures which may limit intensity of PT   PT Frequency 2x / week   PT Duration 8 weeks   PT Treatment/Interventions ADLs/Self Care Home Management;Neuromuscular re-education;Cognitive remediation;Biofeedback;DME Instruction;Gait training;Stair training;Canalith Repostioning;Patient/family education;Orthotic Fit/Training;Balance training;Therapeutic exercise;Manual techniques;Therapeutic activities;Vestibular   PT Next Visit Plan amb without RW.   PT Home Exercise Plan Stretching/strength/balance HEP   Consulted and Agree with Plan of Care Patient   Family Member Consulted pt's husband [name] present at end of session        Problem List Patient Active Problem List   Diagnosis Date Noted  . Spastic hemiplegia and hemiparesis affecting dominant side ([place]) 09/13/2015  . Adhesive capsulitis of right shoulder 08/24/2015  . Symptomatic partial epilepsy with simple partial seizures ([place]) 08/21/2015  . Nontraumatic cortical hemorrhage of cerebral hemisphere ([place])   . Seizure ([place])   . Seizures ([place])   . Adjustment disorder with depressed mood   . Contracture of muscle ankle and foot 06/11/2015  . Hemiplga fol ntrm intcrbl hemor aff right dominant side 06/06/2015  . Left-sided intracerebral hemorrhage ([place]) 06/04/2015  . Seizure disorder as sequela of cerebrovascular accident ([place])   . Seizure disorder ([place]) 06/03/2015  . Sepsis ([place]) 06/03/2015  . UTI (urinary tract infection) 06/03/2015  . Hypotension 06/02/2015  . Right spastic hemiparesis ([place]) 05/28/2015  . 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 History of anxiety disorder 05/28/2015  . ICH (intracerebral hemorrhage) ([place])   . Seizure disorder, nonconvulsive, with status epilepticus ([place])   . Cerebral venous thrombosis of cortical vein   . Cytotoxic cerebral edema ([place])   . IVH (intraventricular hemorrhage) ([place])   . Term pregnancy 05/07/2015  .  Spontaneous  vaginal delivery 05/07/2015    Sharmin Foulk L 12/20/2015, 11:41 AM  [facility] [address] [place], [place], [address] Phone: [phone]   Fax:  [phone]  Name: Ashley [name] MRN: [id number] Date of Birth: 1976-03-06    Geoffry Paradise, PT,DPT 12/20/2015 11:41 AM Phone: [phone] Fax: [phone]

## 2015-12-21 ENCOUNTER — Ambulatory Visit: Payer: BLUE CROSS/BLUE SHIELD

## 2015-12-25 ENCOUNTER — Ambulatory Visit: Payer: BLUE CROSS/BLUE SHIELD | Admitting: Occupational Therapy

## 2015-12-25 ENCOUNTER — Ambulatory Visit: Payer: BLUE CROSS/BLUE SHIELD

## 2015-12-25 ENCOUNTER — Encounter: Payer: Self-pay | Admitting: Occupational Therapy

## 2015-12-25 DIAGNOSIS — M25511 Pain in right shoulder: Secondary | ICD-10-CM

## 2015-12-25 DIAGNOSIS — R29818 Other symptoms and signs involving the nervous system: Secondary | ICD-10-CM

## 2015-12-25 DIAGNOSIS — R269 Unspecified abnormalities of gait and mobility: Secondary | ICD-10-CM

## 2015-12-25 DIAGNOSIS — M25611 Stiffness of right shoulder, not elsewhere classified: Secondary | ICD-10-CM

## 2015-12-25 DIAGNOSIS — IMO0002 Reserved for concepts with insufficient information to code with codable children: Secondary | ICD-10-CM

## 2015-12-25 DIAGNOSIS — R2991 Unspecified symptoms and signs involving the musculoskeletal system: Secondary | ICD-10-CM

## 2015-12-25 DIAGNOSIS — G8111 Spastic hemiplegia affecting right dominant side: Secondary | ICD-10-CM

## 2015-12-25 NOTE — Therapy (Signed)
[facility] [address] [place], [place], [address] Phone: [phone]   Fax:  [phone]  Physical Therapy Treatment  Patient Details  Name: Ashley [name] MRN: [id number] Date of Birth: July 23, 1976 No Data Recorded  Encounter Date: 12/25/2015      PT End of Session - 12/25/15 1021    Visit Number 63   Number of Visits 67   Date for PT Re-Evaluation 02/11/16   Authorization Type BCBS. Per Laverda Page, visits now 30 for PT and OT total (15 per OT and 15 per PT). Pt wishes to continue at self pay after visit limit met.    Authorization - Visit Number 20   Authorization - Number of Visits 15   PT Start Time [phone]   PT Stop Time 1014   PT Time Calculation (min) 40 min   Equipment Utilized During Treatment Gait belt   Activity Tolerance Patient tolerated treatment well   Behavior During Therapy WFL for tasks assessed/performed      Past Medical History  Diagnosis Date  . Anxiety   . ICH (intracerebral hemorrhage) ([place])   . Stroke ([place])   . Seizures [facility])     Past Surgical History  Procedure Laterality Date  . Dilation and curettage of uterus      There were no vitals filed for this visit.  Visit Diagnosis:  Abnormality of gait  Weakness due to cerebrovascular accident      Subjective Assessment - 12/25/15 0937    Subjective Pt denied falls or changes since last visit.    Pertinent History Seizures, low BP   Patient Stated Goals "Be as close to normal as much as possible and to go back to running" Get back to taking care of my kids, 50 weeks old and 66 and 57 years old   Currently in Pain? No/denies                         [facility] Adult PT Treatment/Exercise - 12/25/15 0938    Ambulation/Gait   Ambulation/Gait Yes   Ambulation/Gait Assistance 4: Min guard;4: Min assist   Ambulation/Gait Assistance Details Pt required min A during amb. without UE support, otherwise, min guard in // bars.  Cues to improve L step length, heel strike and decr. R elbow flexion. Pt amb. in // bars without R AFO  donned.   Ambulation Distance (Feet) --  8x7' and 115'x7   Assistive device None;Parallel bars   Gait Pattern Decreased stance time - right;Decreased weight shift to right;Right foot flat;Decreased hip/knee flexion - right;Step-through pattern;Decreased step length - left;Trunk rotated posteriorly on right   Ambulation Surface Level;Indoor      Therex: -Quadriped position: ant/post pelvic tilts with and without cat/cow trunk control x30 reps -Lat. Dorsi stretch in child pose position with pt performing B shoulder flexion out to each side and in the middle 30 second holds x3/side. Cues and demonstration for technique.  Neuro re-ed: Seated on physioball with min guard to min A for safety. Cues for technique.  -Ant/post/lateral pelvic tilts -Counter clockwise and clockwise pelvic tilts -B LE marches with TrA activation.            PT Short Term Goals - 12/20/15 1137    PT SHORT TERM GOAL #1   Title Pt will be IND in HEP to improve strength, balance, and endurance. Target date: 01/10/16.   Status On-going   PT SHORT TERM GOAL #2   Title  Pt will improve gait speed to >/=1.60f/sec to decrease falls risk. Target date: 07/27/15.   Status Achieved   PT SHORT TERM GOAL #3   Title Pt will ambulate 300' with LRAD, over even terrain, at MOD I level to improve functional mobility. Target date: 07/27/15.   Status Achieved   PT SHORT TERM GOAL #4   Title Pt will perform TUG in </=13.5 seconds with LRAD to decr. falls risk. Target date: 01/10/15.   Status On-going   PT SHORT TERM GOAL #5   Title Perform BERG and write STG and LTG as appropriate. Target date: 07/27/15.   Status Achieved   PT SHORT TERM GOAL #6   Title Pt will improve BERG to >/=51/56 to decr. falls risk. Target date: 07/27/15.   Status Achieved   PT SHORT TERM GOAL #7   Title Pt will amb. 500' over even/uneven terrain  with LRAD at MOD I level to improve functional mobility. Target date: 01/10/16   Status On-going   PT SHORT TERM GOAL #8   Title Pt will ascend/descend curb with LRAD at MOD I level to improve functional mobility. Target date: 01/10/16   Status On-going   PT SHORT TERM GOAL #9   TITLE Pt will ascend/descend 12 steps with one handrail at MOD I level to improve functional mobility. Target date: 01/10/16   Status On-going   PT SHORT TERM GOAL #10   TITLE Pt will incr. DGI score to >/=17/24 to decr. falls risk. Target date: 01/10/16   Baseline Revised on 12/20/15, as pt scored 14/24 on DGI.   Status Revised           PT Long Term Goals - 12/20/15 1138    PT LONG TERM GOAL #1   Title Pt will ambulate 1000' over even/uneven terrain, with LRAD, at MOD I level to improve functional mobiliity. Target date: 02/07/16/17.   Baseline All unmet goals will be carried over to new 8 week POC: 02/07/16   Status On-going   PT LONG TERM GOAL #2   Title Pt will amb. 300' over even terrain without an AD, IND, to safely amb. in home. Target date: 02/07/16   Status On-going   PT LONG TERM GOAL #3   Title Pt will improve gait speed to >/=2.615fsec, with LRAD, to amb. safely in the community. Target date: 11/12/14.   Status Achieved   PT LONG TERM GOAL #4   Title Pt will verbalize plans to join a fitness center upon d/c from PT to continue to maintain strength and endurance gains made during PT. Target date: 02/07/16.   Status On-going   PT LONG TERM GOAL #5   Title Pt improve BERG score to >/=55/56 to decr. falls risk. Target date: 12/11/15   Status Achieved   PT LONG TERM GOAL #6   Title Pt will ascend/descend curb with LRAD at MOD I level to improve functional mobility. Target date: 02/07/16   Status On-going   PT LONG TERM GOAL #7   Title Pt will ascend/descend 8 steps with one handrail at MOD I level to improve functional mobility. Target date: 02/07/16   Status On-going   PT LONG TERM GOAL #8   Title Pt  will ascend/descend ladder at MOD I level in order to transfer in/out of water. Target date: 02/07/16   Status On-going   PT LONG TERM GOAL  #9   TITLE Pt will be able to transfer on/off boat at MOD I level, and no LOB, in order  improve functional mobility and participate in family activities. Target date: 02/07/16   Status On-going   PT LONG TERM GOAL  #10   TITLE Pt will be IIND in progressed HEP in order to improve balance, strength and flexibility. Target date: 02/07/16   Status On-going   PT LONG TERM GOAL  #11   TITLE Pt will improve DGI score to >/=20/24 to decr. falls risk. Target date: 02/07/16   Status New               Plan - 12/25/15 1022    Clinical Impression Statement Pt demonstrated progress as she was able to amb. without an AD, with less manual cues to facilitate R hip protraction. Pt continues to require cues to improve RLE wt. bearing in order to improve L step length and heel strike. Pt would continue to benefit from skilled PT to improve safety during functional mobility.   Pt will benefit from skilled therapeutic intervention in order to improve on the following deficits Abnormal gait;Decreased endurance;Impaired sensation;Decreased knowledge of precautions;Decreased activity tolerance;Decreased knowledge of use of DME;Decreased strength;Impaired UE functional use;Impaired tone;Decreased balance;Decreased mobility;Decreased cognition;Decreased range of motion;Decreased safety awareness;Decreased coordination;Impaired flexibility   Rehab Potential Good   Clinical Impairments Affecting Rehab Potential Seizures which may limit intensity of PT   PT Frequency 2x / week   PT Duration 8 weeks   PT Treatment/Interventions ADLs/Self Care Home Management;Neuromuscular re-education;Cognitive remediation;Biofeedback;DME Instruction;Gait training;Stair training;Canalith Repostioning;Patient/family education;Orthotic Fit/Training;Balance training;Therapeutic exercise;Manual  techniques;Therapeutic activities;Vestibular   PT Next Visit Plan BWSTT and overground gait training without AD   PT Home Exercise Plan Stretching/strength/balance HEP   Consulted and Agree with Plan of Care Patient        Problem List Patient Active Problem List   Diagnosis Date Noted  . Spastic hemiplegia and hemiparesis affecting dominant side ([place]) 09/13/2015  . Adhesive capsulitis of right shoulder 08/24/2015  . Symptomatic partial epilepsy with simple partial seizures ([place]) 08/21/2015  . Nontraumatic cortical hemorrhage of cerebral hemisphere ([place])   . Seizure ([place])   . Seizures ([place])   . Adjustment disorder with depressed mood   . Contracture of muscle ankle and foot 06/11/2015  . Hemiplga fol ntrm intcrbl hemor aff right dominant side 06/06/2015  . Left-sided intracerebral hemorrhage ([place]) 06/04/2015  . Seizure disorder as sequela of cerebrovascular accident ([place])   . Seizure disorder ([place]) 06/03/2015  . Sepsis ([place]) 06/03/2015  . UTI (urinary tract infection) 06/03/2015  . Hypotension 06/02/2015  . Right spastic hemiparesis ([place]) 05/28/2015  . 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 History of anxiety disorder 05/28/2015  . ICH (intracerebral hemorrhage) ([place])   . Seizure disorder, nonconvulsive, with status epilepticus ([place])   . Cerebral venous thrombosis of cortical vein   . Cytotoxic cerebral edema ([place])   . IVH (intraventricular hemorrhage) ([place])   . Term pregnancy 05/07/2015  . Spontaneous vaginal delivery 05/07/2015    Miller,Jennifer L 12/25/2015, 10:24 AM  [facility] [address] [place], [place], [address] Phone: [phone]   Fax:  [phone]  Name: Nohemi Nicklaus MRN: [id number] Date of Birth: 01-18-1976    Geoffry Paradise, PT,DPT 12/25/2015 10:24 AM Phone: [phone] Fax: [phone]

## 2015-12-25 NOTE — Therapy (Signed)
[facility] [address] [place], [place], [address] Phone: [phone]   Fax:  [phone]  Occupational Therapy Treatment  Patient Details  Name: Ashley [name] MRN: [id number] Date of Birth: [birth year] No Data Recorded  Encounter Date: 12/25/2015      OT End of Session - 12/25/15 1214    Visit Number 20   Number of Visits 33   Date for OT Re-Evaluation 02/05/16   Authorization Type BCBS   Authorization Time Period 30 visit COMBINED between PT/OT;  pt has utilized benefit and is now self pay.    Authorization - Visit Number 20   Authorization - Number of Visits 63   OT Start Time 1015   OT Stop Time 1101   OT Time Calculation (min) 46 min   Activity Tolerance Patient tolerated treatment well      Past Medical History  Diagnosis Date  . Anxiety   . ICH (intracerebral hemorrhage) ([place])   . Stroke ([place])   . Seizures [facility])     Past Surgical History  Procedure Laterality Date  . Dilation and curettage of uterus      There were no vitals filed for this visit.  Visit Diagnosis:  Right spastic hemiparesis (HCC)  Pain in joint of right shoulder  Stiffness of joint, shoulder region, right  Poor trunk control      Subjective Assessment - 12/25/15 1027    Subjective  I have no pain in my shoulder and I think the stretching exercises are helping to make it feel less tight   Pertinent History see epic snapshot; pt with SAH 2 weeks after deliverying her 3rd child   Patient Stated Goals to be able to use my R arm normally   Currently in Pain? No/denies                      OT Treatments/Exercises (OP) - 12/25/15 0001    Neurological Re-education Exercises   Other Exercises 1 Neuro re ed to address trunk and RUE control for mid to overhead reach with resistiance with focus on increasing strength and stabilization in RUE proximally as pt attempts functional tasks in those planes.  Pt able today  to lift 5 pound weighted object to overhead shelf with very minimal compensations in movement pattern. Also addressed core control in standing with emphasis on reciprocal weight shifts left to right for tasks Pt needs mod cues and min facilliation to enage trunk with these demands. Pt improving in her ability to identify what she needs to do maintain control. despite severe senosory loss, apraxia and cognitive deficits.                   OT Short Term Goals - 12/25/15 1211    OT SHORT TERM GOAL  #11   TITLE Pt will be mod I with upgraded HEP - 01/08/2016   Status On-going   OT SHORT TERM GOAL  #12   TITLE Pt will be able to pick up 8 pound object x 4 trials bilaterally to place on overhead shelf requiring at least 120* of shoulder flexion (for household mgmt tasks, grocery shopping ,etc)   Status On-going   OT SHORT TERM GOAL  #13   TITLE Pt will be able to lift lightweight object unilaterally with RUE to 120* of shoulder flexion (baseline 115*)   Status On-going   OT SHORT TERM GOAL  #14   TITLE Pt will be able to lift  baby into and out of tub while in kneeling from and to baby seat using both UE's   Status On-going           OT Long Term Goals - 12/25/15 1212    OT LONG TERM GOAL #4   Baseline pt reports using 60% of the time as active assist   OT LONG TERM GOAL #7   Status Partially Met   OT LONG TERM GOAL  #9   Baseline I with updated HEP.   Status Achieved   OT LONG TERM GOAL  #10   TITLE Pt will be mod I with upgraded HEP - 02/05/2016   Status On-going   OT LONG TERM GOAL  #11   TITLE Pt will be able to pick up 5 pound object with RUE to place on overhead shelf x4 trials that requres at least 120* of shoulder flexion (for home mgmt tasks, care of child, volunteering)   Status On-going   OT LONG TERM GOAL  #12   TITLE Pt will be able to carry at least a 15 pound object using both hands without drops for 20 feet.   Status On-going   OT LONG TERM GOAL  #13   TITLE  Pt will be able to get off toilet without using UE's (to allow her to use public restrooms, boat bathroom that are without grab bars)   Status On-going               Plan - 12/25/15 1212    Clinical Impression Statement Pt conitnues to make excellent progress toward goals. Pt improving in alignment, overhead reach and trunk control.    Pt will benefit from skilled therapeutic intervention in order to improve on the following deficits (Retired) Abnormal gait;Decreased coordination;Decreased range of motion;Difficulty walking;Impaired flexibility;Decreased endurance;Decreased safety awareness;Increased edema;Impaired tone;Decreased knowledge of precautions;Decreased activity tolerance;Decreased balance;Decreased knowledge of use of DME;Pain;Impaired UE functional use;Impaired vision/preception;Decreased cognition;Decreased mobility;Decreased strength   Clinical Impairments Affecting Rehab Potential cognition, impaired sensation and anxiety   OT Frequency 2x / week   OT Duration 8 weeks   OT Treatment/Interventions Self-care/ADL training;Therapeutic exercise;Patient/family education;Balance training;Ultrasound;Neuromuscular education;Manual Therapy;Splinting;Therapeutic exercises;Energy conservation;Parrafin;DME and/or AE instruction;Therapeutic activities;Cognitive remediation/compensation;Gait Training;Fluidtherapy;Electrical Stimulation;Moist Heat;Contrast Bath;Passive range of motion;Visual/perceptual remediation/compensation   Plan NMR for bilateral and unilateral overhead reach with strength, trunk control, sit to stand from lower surfaces, transitional movements, allgnment.    Consulted and Agree with Plan of Care Patient        Problem List Patient Active Problem List   Diagnosis Date Noted  . Spastic hemiplegia and hemiparesis affecting dominant side ([place]) 09/13/2015  . Adhesive capsulitis of right shoulder 08/24/2015  . Symptomatic partial epilepsy with simple partial seizures  ([place]) 08/21/2015  . Nontraumatic cortical hemorrhage of cerebral hemisphere ([place])   . Seizure ([place])   . Seizures ([place])   . Adjustment disorder with depressed mood   . Contracture of muscle ankle and foot 06/11/2015  . Hemiplga fol ntrm intcrbl hemor aff right dominant side 06/06/2015  . Left-sided intracerebral hemorrhage ([place]) 06/04/2015  . Seizure disorder as sequela of cerebrovascular accident ([place])   . Seizure disorder ([place]) 06/03/2015  . Sepsis ([place]) 06/03/2015  . UTI (urinary tract infection) 06/03/2015  . Hypotension 06/02/2015  . Right spastic hemiparesis ([place]) 05/28/2015  . 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 History of anxiety disorder 05/28/2015  . ICH (intracerebral hemorrhage) ([place])   . Seizure disorder, nonconvulsive, with status epilepticus ([place])   . Cerebral venous thrombosis of  cortical vein   . Cytotoxic cerebral edema ([place])   . IVH (intraventricular hemorrhage) ([place])   . Term pregnancy 05/07/2015  . Spontaneous vaginal delivery 05/07/2015    Quay Burow, OTR/L 12/25/2015, 12:17 PM  [facility] [address] [place], [place], [address] Phone: [phone]   Fax:  [phone]  Name: Ashley [name] MRN: [id number] Date of Birth: 1976-01-28

## 2015-12-27 ENCOUNTER — Ambulatory Visit: Payer: BLUE CROSS/BLUE SHIELD | Admitting: Occupational Therapy

## 2015-12-27 ENCOUNTER — Ambulatory Visit: Payer: BLUE CROSS/BLUE SHIELD

## 2015-12-27 DIAGNOSIS — M25611 Stiffness of right shoulder, not elsewhere classified: Secondary | ICD-10-CM

## 2015-12-27 DIAGNOSIS — R29818 Other symptoms and signs involving the nervous system: Secondary | ICD-10-CM

## 2015-12-27 DIAGNOSIS — IMO0002 Reserved for concepts with insufficient information to code with codable children: Secondary | ICD-10-CM

## 2015-12-27 DIAGNOSIS — G8111 Spastic hemiplegia affecting right dominant side: Secondary | ICD-10-CM

## 2015-12-27 DIAGNOSIS — R269 Unspecified abnormalities of gait and mobility: Secondary | ICD-10-CM

## 2015-12-27 DIAGNOSIS — M25511 Pain in right shoulder: Secondary | ICD-10-CM

## 2015-12-27 DIAGNOSIS — R2991 Unspecified symptoms and signs involving the musculoskeletal system: Secondary | ICD-10-CM

## 2015-12-27 NOTE — Therapy (Signed)
[facility] [address] [place], [place], [address] Phone: [phone]   Fax:  [phone]  Occupational Therapy Treatment  Patient Details  Name: Ashley [name] MRN: [id number] Date of Birth: 05-27-1976 No Data Recorded  Encounter Date: 12/27/2015      OT End of Session - 12/27/15 1123    Visit Number 21   Number of Visits 33   Date for OT Re-Evaluation 02/05/16   Authorization Type BCBS   Authorization Time Period 30 visit COMBINED between PT/OT;  pt has utilized benefit and is now self pay.    Authorization - Visit Number 21   Authorization - Number of Visits 39   OT Start Time 1017   OT Stop Time 1102   OT Time Calculation (min) 45 min   Activity Tolerance Patient tolerated treatment well      Past Medical History  Diagnosis Date  . Anxiety   . ICH (intracerebral hemorrhage) ([place])   . Stroke ([place])   . Seizures [facility])     Past Surgical History  Procedure Laterality Date  . Dilation and curettage of uterus      There were no vitals filed for this visit.  Visit Diagnosis:  Right spastic hemiparesis (HCC)  Pain in joint of right shoulder  Stiffness of joint, shoulder region, right  Poor trunk control      Subjective Assessment - 12/27/15 1018    Subjective  I can tell when my arm gets tight that I don't have enough weight on my right side.    Pertinent History see epic snapshot; pt with SAH 2 weeks after deliverying her 3rd child   Patient Stated Goals to be able to use my R arm normally   Currently in Pain? No/denies                      OT Treatments/Exercises (OP) - 12/27/15 0001    Neurological Re-education Exercises   Other Exercises 1 Neuro re ed in supine to address trunk control with emphasis on  flexion with rotation for both isometric and isotonic and then with increasing resistance.  Transitioned into sitting and addressed lateral weight shifts with reaching activity  to R to address active elongation and increased weight bearing on R side. Pt is far less fearful of moving on to her right side despite severe sensory and perceptual deficits.  Transitioned into standing and addressing trunk, RUE and RLE control by working toward single leg stance on RLE with RUE support. Pt is improving in her ability to actively weight shift and maintain control in standing.               Balance Exercises - 12/27/15 1014    Balance Exercises: Standing   SLS Eyes closed;Solid surface;3 reps;Upper extremity support 1;10 secs   Other Standing Exercises With cues to improve glute max/med contractions.              OT Short Term Goals - 12/27/15 1118    OT SHORT TERM GOAL  #11   TITLE Pt will be mod I with upgraded HEP - 01/08/2016   Status On-going   OT SHORT TERM GOAL  #12   TITLE Pt will be able to pick up 8 pound object x 4 trials bilaterally to place on overhead shelf requiring at least 120* of shoulder flexion (for household mgmt tasks, grocery shopping ,etc)   Status On-going   OT SHORT TERM GOAL  #13  TITLE Pt will be able to lift lightweight object unilaterally with RUE to 120* of shoulder flexion (baseline 115*)   Status On-going   OT SHORT TERM GOAL  #14   TITLE Pt will be able to lift baby into and out of tub while in kneeling from and to baby seat using both UE's   Status On-going           OT Long Term Goals - 12/27/15 1120    OT LONG TERM GOAL #4   Baseline pt reports using 60% of the time as active assist   OT LONG TERM GOAL #7   Status Partially Met   OT LONG TERM GOAL  #9   Baseline I with updated HEP.   Status Achieved   OT LONG TERM GOAL  #10   TITLE Pt will be mod I with upgraded HEP - 02/05/2016   Status On-going   OT LONG TERM GOAL  #11   TITLE Pt will be able to pick up 5 pound object with RUE to place on overhead shelf x4 trials that requres at least 120* of shoulder flexion (for home mgmt tasks, care of child, volunteering)    Status On-going   OT LONG TERM GOAL  #12   TITLE Pt will be able to carry at least a 15 pound object using both hands without drops for 20 feet.   Status On-going   OT LONG TERM GOAL  #13   TITLE Pt will be able to get off toilet without using UE's (to allow her to use public restrooms, boat bathroom that are without grab bars)   Status On-going               Plan - 12/27/15 1120    Clinical Impression Statement Pt continues to make great progress toward goals. Pt is less fearful of moving onto RLE with functional tasks and is beginning to be able to use other sensory information to determine where she is in space.    Pt will benefit from skilled therapeutic intervention in order to improve on the following deficits (Retired) Abnormal gait;Decreased coordination;Decreased range of motion;Difficulty walking;Impaired flexibility;Decreased endurance;Decreased safety awareness;Increased edema;Impaired tone;Decreased knowledge of precautions;Decreased activity tolerance;Decreased balance;Decreased knowledge of use of DME;Pain;Impaired UE functional use;Impaired vision/preception;Decreased cognition;Decreased mobility;Decreased strength   Rehab Potential Good   Clinical Impairments Affecting Rehab Potential cognition, impaired sensation and anxiety   OT Frequency 2x / week   OT Duration 8 weeks   OT Treatment/Interventions Self-care/ADL training;Therapeutic exercise;Patient/family education;Balance training;Ultrasound;Neuromuscular education;Manual Therapy;Splinting;Therapeutic exercises;Energy conservation;Parrafin;DME and/or AE instruction;Therapeutic activities;Cognitive remediation/compensation;Gait Training;Fluidtherapy;Electrical Stimulation;Moist Heat;Contrast Bath;Passive range of motion;Visual/perceptual remediation/compensation   Plan NMR for bilateral and unilateral reach with strength, trunk control, sit to stand from lower surfaces, transitional movements, alignment, functional  ambulation   Consulted and Agree with Plan of Care Patient        Problem List Patient Active Problem List   Diagnosis Date Noted  . Spastic hemiplegia and hemiparesis affecting dominant side ([place]) 09/13/2015  . Adhesive capsulitis of right shoulder 08/24/2015  . Symptomatic partial epilepsy with simple partial seizures ([place]) 08/21/2015  . Nontraumatic cortical hemorrhage of cerebral hemisphere ([place])   . Seizure ([place])   . Seizures ([place])   . Adjustment disorder with depressed mood   . Contracture of muscle ankle and foot 06/11/2015  . Hemiplga fol ntrm intcrbl hemor aff right dominant side 06/06/2015  . Left-sided intracerebral hemorrhage ([place]) 06/04/2015  . Seizure disorder as sequela of cerebrovascular accident ([place])   .  Seizure disorder ([place]) 06/03/2015  . Sepsis ([place]) 06/03/2015  . UTI (urinary tract infection) 06/03/2015  . Hypotension 06/02/2015  . Right spastic hemiparesis ([place]) 05/28/2015  . 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 History of anxiety disorder 05/28/2015  . ICH (intracerebral hemorrhage) ([place])   . Seizure disorder, nonconvulsive, with status epilepticus ([place])   . Cerebral venous thrombosis of cortical vein   . Cytotoxic cerebral edema ([place])   . IVH (intraventricular hemorrhage) ([place])   . Term pregnancy 05/07/2015  . Spontaneous vaginal delivery 05/07/2015    Quay Burow, OTR/L 12/27/2015, 11:24 AM  [facility] [address] [place], [place], [address] Phone: [phone]   Fax:  [phone]  Name: Mahlet Jergens MRN: [id number] Date of Birth: 1976/09/29

## 2015-12-27 NOTE — Therapy (Signed)
[facility] [address] [place], [place], [address] Phone: [phone]   Fax:  [phone]  Physical Therapy Treatment  Patient Details  Name: Ashley [name] MRN: [id number] Date of Birth: 06-Nov-1975 No Data Recorded  Encounter Date: 12/27/2015      PT End of Session - 12/27/15 1019    Visit Number 48   Number of Visits 67   Date for PT Re-Evaluation 02/11/16   Authorization Type BC[employer]. Per Laverda Page, visits now 30 for PT and OT total (15 per OT and 15 per PT). Pt wishes to continue at self pay after visit limit met.    Authorization - Visit Number 21   Authorization - Number of Visits 15   PT Start Time [id number]   PT Stop Time 1013   PT Time Calculation (min) 42 min   Equipment Utilized During Treatment Gait belt  and BWSTT harness   Activity Tolerance Patient tolerated treatment well   Behavior During Therapy WFL for tasks assessed/performed      Past Medical History  Diagnosis Date  . Anxiety   . ICH (intracerebral hemorrhage) ([place])   . Stroke ([place])   . Seizures [facility])     Past Surgical History  Procedure Laterality Date  . Dilation and curettage of uterus      There were no vitals filed for this visit.  Visit Diagnosis:  Abnormality of gait  Weakness due to cerebrovascular accident      Subjective Assessment - 12/27/15 0934    Subjective Pt denied falls or changes since last visit.    Pertinent History Seizures, low BP   Patient Stated Goals "Be as close to normal as much as possible and to go back to running" Get back to taking care of my kids, 48 weeks old and 63 and 76 years old   Currently in Pain? No/denies                         [facility] Adult PT Treatment/Exercise - 12/27/15 0935    Ambulation/Gait   Ambulation/Gait Yes   Ambulation/Gait Assistance 4: Min guard;Other (comment)  BWSTT harness   Ambulation/Gait Assistance Details Pt performed BWSTT, 2 bouts of 6 minutes at  1.31mh (0.12 mile distance), with B UE and one UE support (R and L hand). Cues to improve L step length, R LE stance time, and R hip protractions.   Ambulation Distance (Feet) 345 Feet  x2 and BWSTT (0.12 miles per bout)   Assistive device None;Body weight support system   Gait Pattern Decreased stance time - right;Decreased weight shift to right;Right foot flat;Decreased hip/knee flexion - right;Step-through pattern;Decreased step length - left;Trunk rotated posteriorly on right   Ambulation Surface Level;Indoor             Balance Exercises - 12/27/15 1014    Balance Exercises: Standing   SLS Eyes closed;Solid surface;3 reps;Upper extremity support 1;10 secs   Other Standing Exercises With cues to improve glute max/med contractions.            PT Education - 12/27/15 1018    Education provided Yes   Education Details PT discussed pt's progress, and showed pt gait videos from over one month ago so pt could see her progress. Pt reported she can now feel a difference and improvement during gait.   Person(s) Educated Patient   Methods Explanation   Comprehension Verbalized understanding  PT Short Term Goals - 12/20/15 1137    PT SHORT TERM GOAL #1   Title Pt will be IND in HEP to improve strength, balance, and endurance. Target date: 01/10/16.   Status On-going   PT SHORT TERM GOAL #2   Title Pt will improve gait speed to >/=1.70f/sec to decrease falls risk. Target date: 07/27/15.   Status Achieved   PT SHORT TERM GOAL #3   Title Pt will ambulate 300' with LRAD, over even terrain, at MOD I level to improve functional mobility. Target date: 07/27/15.   Status Achieved   PT SHORT TERM GOAL #4   Title Pt will perform TUG in </=13.5 seconds with LRAD to decr. falls risk. Target date: 01/10/15.   Status On-going   PT SHORT TERM GOAL #5   Title Perform BERG and write STG and LTG as appropriate. Target date: 07/27/15.   Status Achieved   PT SHORT TERM GOAL #6   Title  Pt will improve BERG to >/=51/56 to decr. falls risk. Target date: 07/27/15.   Status Achieved   PT SHORT TERM GOAL #7   Title Pt will amb. 500' over even/uneven terrain with LRAD at MOD I level to improve functional mobility. Target date: 01/10/16   Status On-going   PT SHORT TERM GOAL #8   Title Pt will ascend/descend curb with LRAD at MOD I level to improve functional mobility. Target date: 01/10/16   Status On-going   PT SHORT TERM GOAL #9   TITLE Pt will ascend/descend 12 steps with one handrail at MOD I level to improve functional mobility. Target date: 01/10/16   Status On-going   PT SHORT TERM GOAL #10   TITLE Pt will incr. DGI score to >/=17/24 to decr. falls risk. Target date: 01/10/16   Baseline Revised on 12/20/15, as pt scored 14/24 on DGI.   Status Revised           PT Long Term Goals - 12/20/15 1138    PT LONG TERM GOAL #1   Title Pt will ambulate 1000' over even/uneven terrain, with LRAD, at MOD I level to improve functional mobiliity. Target date: 02/07/16/17.   Baseline All unmet goals will be carried over to new 8 week POC: 02/07/16   Status On-going   PT LONG TERM GOAL #2   Title Pt will amb. 300' over even terrain without an AD, IND, to safely amb. in home. Target date: 02/07/16   Status On-going   PT LONG TERM GOAL #3   Title Pt will improve gait speed to >/=2.628fsec, with LRAD, to amb. safely in the community. Target date: 11/12/14.   Status Achieved   PT LONG TERM GOAL #4   Title Pt will verbalize plans to join a fitness center upon d/c from PT to continue to maintain strength and endurance gains made during PT. Target date: 02/07/16.   Status On-going   PT LONG TERM GOAL #5   Title Pt improve BERG score to >/=55/56 to decr. falls risk. Target date: 12/11/15   Status Achieved   PT LONG TERM GOAL #6   Title Pt will ascend/descend curb with LRAD at MOD I level to improve functional mobility. Target date: 02/07/16   Status On-going   PT LONG TERM GOAL #7   Title Pt  will ascend/descend 8 steps with one handrail at MOD I level to improve functional mobility. Target date: 02/07/16   Status On-going   PT LONG TERM GOAL #8   Title Pt will ascend/descend  ladder at MOD I level in order to transfer in/out of water. Target date: 02/07/16   Status On-going   PT LONG TERM GOAL  #9   TITLE Pt will be able to transfer on/off boat at MOD I level, and no LOB, in order improve functional mobility and participate in family activities. Target date: 02/07/16   Status On-going   PT LONG TERM GOAL  #10   TITLE Pt will be IIND in progressed HEP in order to improve balance, strength and flexibility. Target date: 02/07/16   Status On-going   PT LONG TERM GOAL  #11   TITLE Pt will improve DGI score to >/=20/24 to decr. falls risk. Target date: 02/07/16   Status New               Plan - 12/27/15 1019    Clinical Impression Statement Pt demonstrated progress, as she was able to perform BWSTT with one UE support vs. two UE support. Pt was also able to amb. longer distances without RW, but with min guard to ensure safety. Pt required two seated rest during gait 2/2 fatigue. Pt continues to require cues to improve R glute max/med contraction to improve balance during R SLS stance. Continue with POC.   Pt will benefit from skilled therapeutic intervention in order to improve on the following deficits Abnormal gait;Decreased endurance;Impaired sensation;Decreased knowledge of precautions;Decreased activity tolerance;Decreased knowledge of use of DME;Decreased strength;Impaired UE functional use;Impaired tone;Decreased balance;Decreased mobility;Decreased cognition;Decreased range of motion;Decreased safety awareness;Decreased coordination;Impaired flexibility   Rehab Potential Good   Clinical Impairments Affecting Rehab Potential Seizures which may limit intensity of PT   PT Frequency 2x / week   PT Duration 8 weeks   PT Treatment/Interventions ADLs/Self Care Home  Management;Neuromuscular re-education;Cognitive remediation;Biofeedback;DME Instruction;Gait training;Stair training;Canalith Repostioning;Patient/family education;Orthotic Fit/Training;Balance training;Therapeutic exercise;Manual techniques;Therapeutic activities;Vestibular   PT Next Visit Plan Core strengthening, ant/post pelvic tilts    PT Home Exercise Plan Stretching/strength/balance HEP   Consulted and Agree with Plan of Care Patient        Problem List Patient Active Problem List   Diagnosis Date Noted  . Spastic hemiplegia and hemiparesis affecting dominant side ([place]) 09/13/2015  . Adhesive capsulitis of right shoulder 08/24/2015  . Symptomatic partial epilepsy with simple partial seizures ([place]) 08/21/2015  . Nontraumatic cortical hemorrhage of cerebral hemisphere ([place])   . Seizure ([place])   . Seizures ([place])   . Adjustment disorder with depressed mood   . Contracture of muscle ankle and foot 06/11/2015  . Hemiplga fol ntrm intcrbl hemor aff right dominant side 06/06/2015  . Left-sided intracerebral hemorrhage ([place]) 06/04/2015  . Seizure disorder as sequela of cerebrovascular accident ([place])   . Seizure disorder ([place]) 06/03/2015  . Sepsis ([place]) 06/03/2015  . UTI (urinary tract infection) 06/03/2015  . Hypotension 06/02/2015  . Right spastic hemiparesis ([place]) 05/28/2015  . 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 History of anxiety disorder 05/28/2015  . ICH (intracerebral hemorrhage) ([place])   . Seizure disorder, nonconvulsive, with status epilepticus ([place])   . Cerebral venous thrombosis of cortical vein   . Cytotoxic cerebral edema ([place])   . IVH (intraventricular hemorrhage) ([place])   . Term pregnancy 05/07/2015  . Spontaneous vaginal delivery 05/07/2015    Ioana Louks L 12/27/2015, 10:34 AM  [facility] [address] [place], [place], [address] Phone: [phone]   Fax:   [phone]  Name: Ashley [name] MRN: [id number] Date of Birth: [birth year]     Geoffry Paradise, PT,DPT  12/27/2015 10:34 AM Phone: [phone] Fax: [phone]

## 2016-01-01 ENCOUNTER — Ambulatory Visit: Payer: BLUE CROSS/BLUE SHIELD

## 2016-01-01 ENCOUNTER — Encounter: Payer: Self-pay | Admitting: Occupational Therapy

## 2016-01-01 ENCOUNTER — Ambulatory Visit: Payer: BLUE CROSS/BLUE SHIELD | Admitting: Occupational Therapy

## 2016-01-01 DIAGNOSIS — G8111 Spastic hemiplegia affecting right dominant side: Secondary | ICD-10-CM | POA: Diagnosis not present

## 2016-01-01 DIAGNOSIS — M25611 Stiffness of right shoulder, not elsewhere classified: Secondary | ICD-10-CM

## 2016-01-01 DIAGNOSIS — M25511 Pain in right shoulder: Secondary | ICD-10-CM

## 2016-01-01 DIAGNOSIS — R2991 Unspecified symptoms and signs involving the musculoskeletal system: Secondary | ICD-10-CM

## 2016-01-01 DIAGNOSIS — R269 Unspecified abnormalities of gait and mobility: Secondary | ICD-10-CM

## 2016-01-01 DIAGNOSIS — R29898 Other symptoms and signs involving the musculoskeletal system: Secondary | ICD-10-CM

## 2016-01-01 DIAGNOSIS — R278 Other lack of coordination: Secondary | ICD-10-CM

## 2016-01-01 DIAGNOSIS — R29818 Other symptoms and signs involving the nervous system: Secondary | ICD-10-CM

## 2016-01-01 NOTE — Therapy (Signed)
[facility] [address] [place], [place], [address] Phone: [phone]   Fax:  [phone]  Physical Therapy Treatment  Patient Details  Name: Ashley [name] MRN: [id number] Date of Birth: 1975/12/02 No Data Recorded  Encounter Date: 01/01/2016      PT End of Session - 01/01/16 1309    Visit Number 51   Number of Visits 60   Date for PT Re-Evaluation 02/11/16   Authorization Type BCBS. Per Laverda Page, visits now 30 for PT and OT total (15 per OT and 15 per PT). Pt wishes to continue at self pay after visit limit met.    Authorization - Visit Number 22   Authorization - Number of Visits 15   PT Start Time 1016   PT Stop Time 1056   PT Time Calculation (min) 40 min   Equipment Utilized During Treatment Gait belt   Activity Tolerance Patient tolerated treatment well   Behavior During Therapy WFL for tasks assessed/performed      Past Medical History  Diagnosis Date  . Anxiety   . ICH (intracerebral hemorrhage) ([place])   . Stroke ([place])   . Seizures [facility])     Past Surgical History  Procedure Laterality Date  . Dilation and curettage of uterus      There were no vitals filed for this visit.  Visit Diagnosis:  Abnormality of gait  Coordination impairment  Right leg weakness      Subjective Assessment - 01/01/16 1018    Subjective Pt denied falls or changes since last visit.    Pertinent History Seizures, low BP   Patient Stated Goals "Be as close to normal as much as possible and to go back to running" Get back to taking care of my kids, 45 weeks old and 5 and 51 years old   Currently in Pain? No/denies        Therex: -Quadriped position: ant/post pelvic tilts with cat/cow trunk control x20 reps -Lat. Dorsi stretch in child pose position with pt performing B shoulder flexion out to each side and in the middle 30 second holds x3/side. Cues and demonstration for technique.  Neuro re-ed: Seated on  physioball with min guard to min A for safety. Cues for technique.  -Ant/post/lateral pelvic tilts x20/activity -B LE marches with TrA activation x10 -B UE shoulder flexion x10 -BLE LAQs with TrA activation x10/LE. -B UE flexion and B LE marches x5.                             Balance Exercises - 01/01/16 1307    Balance Exercises: Standing   SLS Solid surface;5 reps;Eyes open   Other Standing Exercises With 0-1 UE support on chair and min guard to min A to maintain balance, and cues for technique and to activate glute max/med: pt performed SLS with B LEs, 5 sec. holds over 4" step.           PT Education - 01/01/16 1308    Education provided Yes   Education Details PT discussed the importance of eating breakfast and staying hydrated prior to therapy sessions, as pt required seated rest break with crackers and water 2/2 lightheadedness upon standing during SLS activity.   Person(s) Educated Patient   Methods Explanation   Comprehension Verbalized understanding          PT Short Term Goals - 12/20/15 1137    PT SHORT TERM GOAL #1  Title Pt will be IND in HEP to improve strength, balance, and endurance. Target date: 01/10/16.   Status On-going   PT SHORT TERM GOAL #2   Title Pt will improve gait speed to >/=1.37f/sec to decrease falls risk. Target date: 07/27/15.   Status Achieved   PT SHORT TERM GOAL #3   Title Pt will ambulate 300' with LRAD, over even terrain, at MOD I level to improve functional mobility. Target date: 07/27/15.   Status Achieved   PT SHORT TERM GOAL #4   Title Pt will perform TUG in </=13.5 seconds with LRAD to decr. falls risk. Target date: 01/10/15.   Status On-going   PT SHORT TERM GOAL #5   Title Perform BERG and write STG and LTG as appropriate. Target date: 07/27/15.   Status Achieved   PT SHORT TERM GOAL #6   Title Pt will improve BERG to >/=51/56 to decr. falls risk. Target date: 07/27/15.   Status Achieved   PT SHORT  TERM GOAL #7   Title Pt will amb. 500' over even/uneven terrain with LRAD at MOD I level to improve functional mobility. Target date: 01/10/16   Status On-going   PT SHORT TERM GOAL #8   Title Pt will ascend/descend curb with LRAD at MOD I level to improve functional mobility. Target date: 01/10/16   Status On-going   PT SHORT TERM GOAL #9   TITLE Pt will ascend/descend 12 steps with one handrail at MOD I level to improve functional mobility. Target date: 01/10/16   Status On-going   PT SHORT TERM GOAL #10   TITLE Pt will incr. DGI score to >/=17/24 to decr. falls risk. Target date: 01/10/16   Baseline Revised on 12/20/15, as pt scored 14/24 on DGI.   Status Revised           PT Long Term Goals - 12/20/15 1138    PT LONG TERM GOAL #1   Title Pt will ambulate 1000' over even/uneven terrain, with LRAD, at MOD I level to improve functional mobiliity. Target date: 02/07/16/17.   Baseline All unmet goals will be carried over to new 8 week POC: 02/07/16   Status On-going   PT LONG TERM GOAL #2   Title Pt will amb. 300' over even terrain without an AD, IND, to safely amb. in home. Target date: 02/07/16   Status On-going   PT LONG TERM GOAL #3   Title Pt will improve gait speed to >/=2.671fsec, with LRAD, to amb. safely in the community. Target date: 11/12/14.   Status Achieved   PT LONG TERM GOAL #4   Title Pt will verbalize plans to join a fitness center upon d/c from PT to continue to maintain strength and endurance gains made during PT. Target date: 02/07/16.   Status On-going   PT LONG TERM GOAL #5   Title Pt improve BERG score to >/=55/56 to decr. falls risk. Target date: 12/11/15   Status Achieved   PT LONG TERM GOAL #6   Title Pt will ascend/descend curb with LRAD at MOD I level to improve functional mobility. Target date: 02/07/16   Status On-going   PT LONG TERM GOAL #7   Title Pt will ascend/descend 8 steps with one handrail at MOD I level to improve functional mobility. Target date:  02/07/16   Status On-going   PT LONG TERM GOAL #8   Title Pt will ascend/descend ladder at MOD I level in order to transfer in/out of water. Target date: 02/07/16  Status On-going   PT LONG TERM GOAL  #9   TITLE Pt will be able to transfer on/off boat at MOD I level, and no LOB, in order improve functional mobility and participate in family activities. Target date: 02/07/16   Status On-going   PT LONG TERM GOAL  #10   TITLE Pt will be IIND in progressed HEP in order to improve balance, strength and flexibility. Target date: 02/07/16   Status On-going   PT LONG TERM GOAL  #11   TITLE Pt will improve DGI score to >/=20/24 to decr. falls risk. Target date: 02/07/16   Status New               Plan - 01/01/16 1309    Clinical Impression Statement Pt required seated rest break during SLS activity as she reported lightheadedness, and reported she did not eat breakfast. Pt felt better after eating crackers and drinking water. Pt demonstrated progress as she required less cues during balance activities on physioball today. Pt would continue to benefit from skilled PT to improve safety during functional mobility.   Pt will benefit from skilled therapeutic intervention in order to improve on the following deficits Abnormal gait;Decreased endurance;Impaired sensation;Decreased knowledge of precautions;Decreased activity tolerance;Decreased knowledge of use of DME;Decreased strength;Impaired UE functional use;Impaired tone;Decreased balance;Decreased mobility;Decreased cognition;Decreased range of motion;Decreased safety awareness;Decreased coordination;Impaired flexibility   Rehab Potential Good   Clinical Impairments Affecting Rehab Potential Seizures which may limit intensity of PT   PT Frequency 2x / week   PT Duration 8 weeks   PT Treatment/Interventions ADLs/Self Care Home Management;Neuromuscular re-education;Cognitive remediation;Biofeedback;DME Instruction;Gait training;Stair training;Canalith  Repostioning;Patient/family education;Orthotic Fit/Training;Balance training;Therapeutic exercise;Manual techniques;Therapeutic activities;Vestibular   PT Next Visit Plan Gait training, no UE support.   PT Home Exercise Plan Stretching/strength/balance HEP   Consulted and Agree with Plan of Care Patient        Problem List Patient Active Problem List   Diagnosis Date Noted  . Spastic hemiplegia and hemiparesis affecting dominant side ([place]) 09/13/2015  . Adhesive capsulitis of right shoulder 08/24/2015  . Symptomatic partial epilepsy with simple partial seizures ([place]) 08/21/2015  . Nontraumatic cortical hemorrhage of cerebral hemisphere ([place])   . Seizure ([place])   . Seizures ([place])   . Adjustment disorder with depressed mood   . Contracture of muscle ankle and foot 06/11/2015  . Hemiplga fol ntrm intcrbl hemor aff right dominant side 06/06/2015  . Left-sided intracerebral hemorrhage ([place]) 06/04/2015  . Seizure disorder as sequela of cerebrovascular accident ([place])   . Seizure disorder ([place]) 06/03/2015  . Sepsis ([place]) 06/03/2015  . UTI (urinary tract infection) 06/03/2015  . Hypotension 06/02/2015  . Right spastic hemiparesis ([place]) 05/28/2015  . 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 History of anxiety disorder 05/28/2015  . ICH (intracerebral hemorrhage) ([place])   . Seizure disorder, nonconvulsive, with status epilepticus ([place])   . Cerebral venous thrombosis of cortical vein   . Cytotoxic cerebral edema ([place])   . IVH (intraventricular hemorrhage) ([place])   . Term pregnancy 05/07/2015  . Spontaneous vaginal delivery 05/07/2015    Miller,Jennifer L 01/01/2016, 1:13 PM  [facility] [address] [place], [place], [address] Phone: [phone]   Fax:  [phone]  Name: Ashley [name] MRN: [id number] Date of Birth: December 08, 1975   Geoffry Paradise, PT,DPT 01/01/2016 1:13 PM Phone:  [phone] Fax: [phone]

## 2016-01-01 NOTE — Therapy (Signed)
[facility] [address] [place], [place], [address] Phone: [phone]   Fax:  [phone]  Occupational Therapy Treatment  Patient Details  Name: Ashley [name] MRN: [id number] Date of Birth: [birth year] No Data Recorded  Encounter Date: 01/01/2016      OT End of Session - 01/01/16 1028    Visit Number 22   Number of Visits 33   Date for OT Re-Evaluation 02/05/16   Authorization Type BCBS   Authorization Time Period 30 visit COMBINED between PT/OT;  pt has utilized benefit and is now self pay.    Authorization - Visit Number 22   Authorization - Number of Visits 33   OT Start Time 0930   OT Stop Time 1016   OT Time Calculation (min) 46 min   Activity Tolerance Patient tolerated treatment well      Past Medical History  Diagnosis Date  . Anxiety   . ICH (intracerebral hemorrhage) ([place])   . Stroke ([place])   . Seizures [facility])     Past Surgical History  Procedure Laterality Date  . Dilation and curettage of uterus      There were no vitals filed for this visit.  Visit Diagnosis:  Right spastic hemiparesis (HCC)  Pain in joint of right shoulder  Stiffness of joint, shoulder region, right  Poor trunk control      Subjective Assessment - 01/01/16 0935    Subjective  Every once in  awhile I get just a little bit of pain in my shoulder but stops immediately.   Pertinent History see epic snapshot; pt with SAH 2 weeks after deliverying her 3rd child   Patient Stated Goals to be able to use my R arm normally   Currently in Pain? No/denies                      OT Treatments/Exercises (OP) - 01/01/16 0001    Neurological Re-education Exercises   Other Exercises 1 Neuro re ed to address trunk control with rotation and resistance using weighted balls in sitting and kneeling;  also focused on ability to shift weight to accomodate picking up various weighted balls and placing in front, holding and  placing in front below.  Also addressed functional mobility to carry up to 5 pound object while ambulating with emphasis in alignment, increasing weight shfit and control on L side, pt's ability to percieive midline during functional ambulation, and transitional movements such as turning and stand to sit.                  OT Short Term Goals - 01/01/16 1025    OT SHORT TERM GOAL  #11   TITLE Pt will be mod I with upgraded HEP - 01/08/2016   Status Achieved   OT SHORT TERM GOAL  #12   TITLE Pt will be able to pick up 8 pound object x 4 trials bilaterally to place on overhead shelf requiring at least 120* of shoulder flexion (for household mgmt tasks, grocery shopping ,etc)   Status Achieved   OT SHORT TERM GOAL  #13   TITLE Pt will be able to lift lightweight object unilaterally with RUE to 120* of shoulder flexion (baseline 115*)   Status On-going   OT SHORT TERM GOAL  #14   TITLE Pt will be able to lift baby into and out of tub while in kneeling from and to baby seat using both UE's   Status On-going  OT Long Term Goals - 01/01/16 1026    OT LONG TERM GOAL #4   Baseline pt reports using 60% of the time as active assist   OT LONG TERM GOAL #7   Status Partially Met   OT LONG TERM GOAL  #9   Baseline I with updated HEP.   Status Achieved   OT LONG TERM GOAL  #10   TITLE Pt will be mod I with upgraded HEP - 02/05/2016   Status On-going   OT LONG TERM GOAL  #11   TITLE Pt will be able to pick up 5 pound object with RUE to place on overhead shelf x4 trials that requres at least 120* of shoulder flexion (for home mgmt tasks, care of child, volunteering)   Status On-going   OT LONG TERM GOAL  #12   TITLE Pt will be able to carry at least a 15 pound object using both hands without drops for 20 feet.   Status On-going   OT LONG TERM GOAL  #13   TITLE Pt will be able to get off toilet without using UE's (to allow her to use public restrooms, boat bathroom that are  without grab bars)   Status On-going               Plan - 01/01/16 1026    Clinical Impression Statement Pt continues to make excellent progress. Pt is now able to pick up 14 pounds while kneeling from object to right and place in front (initially had difficulty with 2 pound weighted ball). Pt also improving in own ability to monitor alignment and midline during functional mobility   Pt will benefit from skilled therapeutic intervention in order to improve on the following deficits (Retired) Abnormal gait;Decreased coordination;Decreased range of motion;Difficulty walking;Impaired flexibility;Decreased endurance;Decreased safety awareness;Increased edema;Impaired tone;Decreased knowledge of precautions;Decreased activity tolerance;Decreased balance;Decreased knowledge of use of DME;Pain;Impaired UE functional use;Impaired vision/preception;Decreased cognition;Decreased mobility;Decreased strength   Rehab Potential Good   Clinical Impairments Affecting Rehab Potential cognition, impaired sensation and anxiety   OT Frequency 2x / week   OT Duration 8 weeks   OT Treatment/Interventions Self-care/ADL training;Therapeutic exercise;Patient/family education;Balance training;Ultrasound;Neuromuscular education;Manual Therapy;Splinting;Therapeutic exercises;Energy conservation;Parrafin;DME and/or AE instruction;Therapeutic activities;Cognitive remediation/compensation;Gait Training;Fluidtherapy;Electrical Stimulation;Moist Heat;Contrast Bath;Passive range of motion;Visual/perceptual remediation/compensation   Plan NMR for bilateral and unitaleral reach with strength, trunk control, sit to stand from lower surfaces, transitional  movements, alignment, functional ambulation   Consulted and Agree with Plan of Care Patient        Problem List Patient Active Problem List   Diagnosis Date Noted  . Spastic hemiplegia and hemiparesis affecting dominant side (HCC) 09/13/2015  . Adhesive capsulitis of  right shoulder 08/24/2015  . Symptomatic partial epilepsy with simple partial seizures (HCC) 08/21/2015  . Nontraumatic cortical hemorrhage of cerebral hemisphere (HCC)   . Seizure (HCC)   . Seizures (HCC)   . Adjustment disorder with depressed mood   . Contracture of muscle ankle and foot 06/11/2015  . Hemiplga fol ntrm intcrbl hemor aff right dominant side 06/06/2015  . Left-sided intracerebral hemorrhage (HCC) 06/04/2015  . Seizure disorder as sequela of cerebrovascular accident (HCC)   . Seizure disorder (HCC) 06/03/2015  . Sepsis (HCC) 06/03/2015  . UTI (urinary tract infection) 06/03/2015  . Hypotension 06/02/2015  . Right spastic hemiparesis (HCC) 05/28/2015  . 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 History of anxiety disorder 05/28/2015  . ICH (intracerebral hemorrhage) (HCC)   . Seizure disorder, nonconvulsive, with status epilepticus (HCC)   .  Cerebral venous thrombosis of cortical vein   . Cytotoxic cerebral edema ([place])   . IVH (intraventricular hemorrhage) ([place])   . Term pregnancy 05/07/2015  . Spontaneous vaginal delivery 05/07/2015    Quay Burow, OTR/L 01/01/2016, 10:29 AM  [facility] [address] [place], [place], [address] Phone: [phone]   Fax:  [phone]  Name: Dedee Liss MRN: [id number] Date of Birth: 09/04/1976

## 2016-01-03 ENCOUNTER — Ambulatory Visit: Payer: BLUE CROSS/BLUE SHIELD | Admitting: Occupational Therapy

## 2016-01-03 ENCOUNTER — Ambulatory Visit: Payer: BLUE CROSS/BLUE SHIELD

## 2016-01-03 ENCOUNTER — Encounter: Payer: Self-pay | Admitting: Occupational Therapy

## 2016-01-03 DIAGNOSIS — R269 Unspecified abnormalities of gait and mobility: Secondary | ICD-10-CM

## 2016-01-03 DIAGNOSIS — R29818 Other symptoms and signs involving the nervous system: Secondary | ICD-10-CM

## 2016-01-03 DIAGNOSIS — M25611 Stiffness of right shoulder, not elsewhere classified: Secondary | ICD-10-CM

## 2016-01-03 DIAGNOSIS — G8111 Spastic hemiplegia affecting right dominant side: Secondary | ICD-10-CM

## 2016-01-03 DIAGNOSIS — M25511 Pain in right shoulder: Secondary | ICD-10-CM

## 2016-01-03 DIAGNOSIS — R2991 Unspecified symptoms and signs involving the musculoskeletal system: Secondary | ICD-10-CM

## 2016-01-03 NOTE — Therapy (Signed)
[facility] [address] [place], [place], [address] Phone: [phone]   Fax:  [phone]  Physical Therapy Treatment  Patient Details  Name: Ashley [name] MRN: [id number] Date of Birth: 10/04/1976 No Data Recorded  Encounter Date: 01/03/2016      PT End of Session - 01/03/16 1213    Visit Number 50   Number of Visits 93   Date for PT Re-Evaluation 02/11/16   Authorization Type BCBS. Per Laverda Page, visits now 30 for PT and OT total (15 per OT and 15 per PT). Pt wishes to continue at self pay after visit limit met.    Authorization - Visit Number 23   Authorization - Number of Visits 15   PT Start Time [id number]   PT Stop Time 1013   PT Time Calculation (min) 40 min   Equipment Utilized During Treatment --  BWSTT   Activity Tolerance Patient tolerated treatment well   Behavior During Therapy WFL for tasks assessed/performed      Past Medical History  Diagnosis Date  . Anxiety   . ICH (intracerebral hemorrhage) ([place])   . Stroke ([place])   . Seizures [facility])     Past Surgical History  Procedure Laterality Date  . Dilation and curettage of uterus      There were no vitals filed for this visit.  Visit Diagnosis:  Abnormality of gait      Subjective Assessment - 01/03/16 0935    Subjective Pt denied falls or changes since last visit. Pt reported she has been practicing step-ups onto wrapped flat cardboard boxes with support on treadmill prn, to practice curb safety.  Pt reported she was able to perform sit<>stand from folding chair at [employer], chair was in grass on a hill. Pt reported she ate a yogurt for breakfast and had coffee and water.   Patient Stated Goals "Be as close to normal as much as possible and to go back to running" Get back to taking care of my kids, 62 weeks old and 42 and 58 years old   Currently in Pain? No/denies                         [facility] Adult PT  Treatment/Exercise - 01/03/16 2536    Ambulation/Gait   Ambulation/Gait Yes   Ambulation/Gait Assistance 4: Min guard;Other (comment)   Ambulation/Gait Assistance Details Pt amb. over even terrain with no AD and on BWSTT. Cues to improve stance time on RLE, decr. R elbow flexion and L step length.  Pt incr. gait speed from 1.66mh on treadmill to 1.55m. Pt required one standing rest break and two seated rest breaks during amb. 2/2 fatigue.   Ambulation Distance (Feet) --  75', 345', 230' no AD and 2 bouts of 6 minutes on treadmill.   Assistive device None;Body weight support system   Gait Pattern Decreased stance time - right;Decreased weight shift to right;Right foot flat;Decreased hip/knee flexion - right;Step-through pattern;Decreased step length - left;Trunk rotated posteriorly on right   Ambulation Surface Level;Indoor                  PT Short Term Goals - 12/20/15 1137    PT SHORT TERM GOAL #1   Title Pt will be IND in HEP to improve strength, balance, and endurance. Target date: 01/10/16.   Status On-going   PT SHORT TERM GOAL #2   Title Pt will improve gait speed to >/=1.50f42fec  to decrease falls risk. Target date: 07/27/15.   Status Achieved   PT SHORT TERM GOAL #3   Title Pt will ambulate 300' with LRAD, over even terrain, at MOD I level to improve functional mobility. Target date: 07/27/15.   Status Achieved   PT SHORT TERM GOAL #4   Title Pt will perform TUG in </=13.5 seconds with LRAD to decr. falls risk. Target date: 01/10/15.   Status On-going   PT SHORT TERM GOAL #5   Title Perform BERG and write STG and LTG as appropriate. Target date: 07/27/15.   Status Achieved   PT SHORT TERM GOAL #6   Title Pt will improve BERG to >/=51/56 to decr. falls risk. Target date: 07/27/15.   Status Achieved   PT SHORT TERM GOAL #7   Title Pt will amb. 500' over even/uneven terrain with LRAD at MOD I level to improve functional mobility. Target date: 01/10/16   Status On-going    PT SHORT TERM GOAL #8   Title Pt will ascend/descend curb with LRAD at MOD I level to improve functional mobility. Target date: 01/10/16   Status On-going   PT SHORT TERM GOAL #9   TITLE Pt will ascend/descend 12 steps with one handrail at MOD I level to improve functional mobility. Target date: 01/10/16   Status On-going   PT SHORT TERM GOAL #10   TITLE Pt will incr. DGI score to >/=17/24 to decr. falls risk. Target date: 01/10/16   Baseline Revised on 12/20/15, as pt scored 14/24 on DGI.   Status Revised           PT Long Term Goals - 12/20/15 1138    PT LONG TERM GOAL #1   Title Pt will ambulate 1000' over even/uneven terrain, with LRAD, at MOD I level to improve functional mobiliity. Target date: 02/07/16/17.   Baseline All unmet goals will be carried over to new 8 week POC: 02/07/16   Status On-going   PT LONG TERM GOAL #2   Title Pt will amb. 300' over even terrain without an AD, IND, to safely amb. in home. Target date: 02/07/16   Status On-going   PT LONG TERM GOAL #3   Title Pt will improve gait speed to >/=2.56f/sec, with LRAD, to amb. safely in the community. Target date: 11/12/14.   Status Achieved   PT LONG TERM GOAL #4   Title Pt will verbalize plans to join a fitness center upon d/c from PT to continue to maintain strength and endurance gains made during PT. Target date: 02/07/16.   Status On-going   PT LONG TERM GOAL #5   Title Pt improve BERG score to >/=55/56 to decr. falls risk. Target date: 12/11/15   Status Achieved   PT LONG TERM GOAL #6   Title Pt will ascend/descend curb with LRAD at MOD I level to improve functional mobility. Target date: 02/07/16   Status On-going   PT LONG TERM GOAL #7   Title Pt will ascend/descend 8 steps with one handrail at MOD I level to improve functional mobility. Target date: 02/07/16   Status On-going   PT LONG TERM GOAL #8   Title Pt will ascend/descend ladder at MOD I level in order to transfer in/out of water. Target date: 02/07/16    Status On-going   PT LONG TERM GOAL  #9   TITLE Pt will be able to transfer on/off boat at MOD I level, and no LOB, in order improve functional mobility and participate in family  activities. Target date: 02/07/16   Status On-going   PT LONG TERM GOAL  #10   TITLE Pt will be IIND in progressed HEP in order to improve balance, strength and flexibility. Target date: 02/07/16   Status On-going   PT LONG TERM GOAL  #11   TITLE Pt will improve DGI score to >/=20/24 to decr. falls risk. Target date: 02/07/16   Status New               Plan - 01/03/16 1214    Clinical Impression Statement Pt demonstrated progress, as she was able to tolerate incr. in walking speed on BWSTT from 1.60mg to 1.565m without significant incr. in gait deviations. Pt continues to require cues to correct gait deviations during overground training, and would continue to benefit from skilled PT to improve safety during functional mobility.   Pt will benefit from skilled therapeutic intervention in order to improve on the following deficits Abnormal gait;Decreased endurance;Impaired sensation;Decreased knowledge of precautions;Decreased activity tolerance;Decreased knowledge of use of DME;Decreased strength;Impaired UE functional use;Impaired tone;Decreased balance;Decreased mobility;Decreased cognition;Decreased range of motion;Decreased safety awareness;Decreased coordination;Impaired flexibility   Rehab Potential Good   Clinical Impairments Affecting Rehab Potential Seizures which may limit intensity of PT   PT Frequency 2x / week   PT Duration 8 weeks   PT Treatment/Interventions ADLs/Self Care Home Management;Neuromuscular re-education;Cognitive remediation;Biofeedback;DME Instruction;Gait training;Stair training;Canalith Repostioning;Patient/family education;Orthotic Fit/Training;Balance training;Therapeutic exercise;Manual techniques;Therapeutic activities;Vestibular   PT Next Visit Plan Check STGs   PT Home Exercise  Plan Stretching/strength/balance HEP   Consulted and Agree with Plan of Care Patient        Problem List Patient Active Problem List   Diagnosis Date Noted  . Spastic hemiplegia and hemiparesis affecting dominant side (HCSonoita12/10/2014  . Adhesive capsulitis of right shoulder 08/24/2015  . Symptomatic partial epilepsy with simple partial seizures (HCMount [place]11/05/2015  . Nontraumatic cortical hemorrhage of cerebral hemisphere (HCOcean  . Seizure (HCWrens  . Seizures (HCHemphill  . Adjustment disorder with depressed mood   . Contracture of muscle ankle and foot 06/11/2015  . Hemiplga fol ntrm intcrbl hemor aff right dominant side 06/06/2015  . Left-sided intracerebral hemorrhage (HCSouth Lineville08/22/2016  . Seizure disorder as sequela of cerebrovascular accident (HCPoint Pleasant Beach  . Seizure disorder (HCBelle Haven08/21/2016  . Sepsis (HCSchoolcraft08/21/2016  . UTI (urinary tract infection) 06/03/2015  . Hypotension 06/02/2015  . Right spastic hemiparesis (HCCountry Club Hills08/15/2016  . 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 History of anxiety disorder 05/28/2015  . ICH (intracerebral hemorrhage) (HCLamar  . Seizure disorder, nonconvulsive, with status epilepticus (HCCampbell Station  . Cerebral venous thrombosis of cortical vein   . Cytotoxic cerebral edema (HCWestphalia  . IVH (intraventricular hemorrhage) (HCLut[place]  . Term pregnancy 05/07/2015  . Spontaneous vaginal delivery 05/07/2015    Evvie Behrmann L 01/03/2016, 12:18 PM  [place]u[place]end[place]hone: [phone] Fax:  [phone]: Ashley FidalgoRN: [id number] of Birth: 9/July 24, 1976  JeGeoffry ParadisePT,DPT 01/03/2016 12:18 PM Phone: [phone]: [phone]

## 2016-01-03 NOTE — Therapy (Signed)
[facility] [address] [place], [place], [address] Phone: [phone]   Fax:  [phone]  Occupational Therapy Treatment  Patient Details  Name: Ashley [name] MRN: [id number] Date of Birth: 03/12/1976 No Data Recorded  Encounter Date: 01/03/2016      OT End of Session - 01/03/16 1239    Visit Number 23   Date for OT Re-Evaluation 02/05/16   Authorization Type BCBS   Authorization Time Period 30 visit COMBINED between PT/OT;  pt has utilized benefit and is now self pay.    Authorization - Visit Number 23   Authorization - Number of Visits 33   Activity Tolerance Patient tolerated treatment well      Past Medical History  Diagnosis Date  . Anxiety   . ICH (intracerebral hemorrhage) ([place])   . Stroke ([place])   . Seizures [facility])     Past Surgical History  Procedure Laterality Date  . Dilation and curettage of uterus      There were no vitals filed for this visit.  Visit Diagnosis:  Right spastic hemiparesis (HCC)  Stiffness of joint, shoulder region, right  Poor trunk control  Pain in joint of right shoulder      Subjective Assessment - 01/03/16 1018    Subjective  I could get out of a sports chair at my son's game last night by myself.    Pertinent History see epic snapshot; pt with SAH 2 weeks after deliverying her 3rd child   Patient Stated Goals to be able to use my R arm normally   Currently in Pain? No/denies                      OT Treatments/Exercises (OP) - 01/03/16 0001    Neurological Re-education Exercises   Other Exercises 1 Neuro re ed to address sit to stand  and stand to sit from low surfaces without use of UE's.and emphasis on decreasing use of momentum and increased control. For first time pt was able to complete from 14 inch surface. 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  Pt able to do repeated repetitions .  Also addressed trunk control and weigh shift in kneeling and half kneeling using weighted ball with improved control. Addressed transitional movement of going from R side sitting into quadraped and addressed active trunk control with resistance in quadraped, kneeling and high kneeling.  Also addressed functional ambulation while carrying a light weight object.                   OT Short Term Goals - 01/03/16 1236    OT SHORT TERM GOAL  #11   TITLE Pt will be mod I with upgraded HEP - 01/08/2016   Status Achieved   OT SHORT TERM GOAL  #12   TITLE Pt will be able to pick up 8 pound object x 4 trials bilaterally to place on overhead shelf requiring at least 120* of shoulder flexion (for household mgmt tasks, grocery shopping ,etc)   Status Achieved   OT SHORT TERM GOAL  #13   TITLE Pt will be able to lift lightweight object unilaterally with RUE to 120* of shoulder flexion (baseline 115*)   Status On-going   OT SHORT TERM GOAL  #14   TITLE Pt will be able to lift baby into and out  of tub while in kneeling from and to baby seat using both UE's   Status On-going           OT Long Term Goals - 01/03/16 1236    OT LONG TERM GOAL #7   Status Partially Met   OT LONG TERM GOAL  #10   TITLE Pt will be mod I with upgraded HEP - 02/05/2016   Status On-going   OT LONG TERM GOAL  #11   TITLE Pt will be able to pick up 5 pound object with RUE to place on overhead shelf x4 trials that requres at least 120* of shoulder flexion (for home mgmt tasks, care of child, volunteering)   Status On-going   OT LONG TERM GOAL  #12   TITLE Pt will be able to carry at least a 15 pound object using both hands without drops for 20 feet.   Status On-going   OT LONG TERM GOAL  #13   TITLE Pt will be able to get off toilet without using UE's (to allow her to use public restrooms, boat bathroom that are without grab bars)   Status Achieved                Plan - 01/03/16 1238    Clinical Impression Statement Pt with great progress toward goals and improving in trunk control, functional mobility and use of RUE.    Pt will benefit from skilled therapeutic intervention in order to improve on the following deficits (Retired) Abnormal gait;Decreased coordination;Decreased range of motion;Difficulty walking;Impaired flexibility;Decreased endurance;Decreased safety awareness;Increased edema;Impaired tone;Decreased knowledge of precautions;Decreased activity tolerance;Decreased balance;Decreased knowledge of use of DME;Pain;Impaired UE functional use;Impaired vision/preception;Decreased cognition;Decreased mobility;Decreased strength   Rehab Potential Good   Clinical Impairments Affecting Rehab Potential cognition, impaired sensation and anxiety   OT Frequency 2x / week   OT Duration 8 weeks   OT Treatment/Interventions Self-care/ADL training;Therapeutic exercise;Patient/family education;Balance training;Ultrasound;Neuromuscular education;Manual Therapy;Splinting;Therapeutic exercises;Energy conservation;Parrafin;DME and/or AE instruction;Therapeutic activities;Cognitive remediation/compensation;Gait Training;Fluidtherapy;Electrical Stimulation;Moist Heat;Contrast Bath;Passive range of motion;Visual/perceptual remediation/compensation   Plan NMR for bilateral and unilateral reach with strength, trunk control, transitional movements, alignment and functional ambulaiton   Consulted and Agree with Plan of Care Patient        Problem List Patient Active Problem List   Diagnosis Date Noted  . Spastic hemiplegia and hemiparesis affecting dominant side ([place]) 09/13/2015  . Adhesive capsulitis of right shoulder 08/24/2015  . Symptomatic partial epilepsy with simple partial seizures ([place]) 08/21/2015  . Nontraumatic cortical hemorrhage of cerebral hemisphere ([place])   . Seizure ([place])   . Seizures ([place])   . Adjustment disorder with depressed mood   .  Contracture of muscle ankle and foot 06/11/2015  . Hemiplga fol ntrm intcrbl hemor aff right dominant side 06/06/2015  . Left-sided intracerebral hemorrhage ([place]) 06/04/2015  . Seizure disorder as sequela of cerebrovascular accident ([place])   . Seizure disorder ([place]) 06/03/2015  . Sepsis ([place]) 06/03/2015  . UTI (urinary tract infection) 06/03/2015  . Hypotension 06/02/2015  . Right spastic hemiparesis ([place]) 05/28/2015  . 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 History of anxiety disorder 05/28/2015  . ICH (intracerebral hemorrhage) ([place])   . Seizure disorder, nonconvulsive, with status epilepticus ([place])   . Cerebral venous thrombosis of cortical vein   . Cytotoxic cerebral edema ([place])   . IVH (intraventricular hemorrhage) ([place])   . Term pregnancy 05/07/2015  . Spontaneous vaginal delivery 05/07/2015    Quay Burow, OTR/L 01/03/2016, 12:40 PM  [facility]  [facility] [address] [place], [place], [address] Phone: [phone]   Fax:  [phone]  Name: Lovelee Forner MRN: [id number] Date of Birth: September 27, 1976

## 2016-01-08 ENCOUNTER — Ambulatory Visit: Payer: BLUE CROSS/BLUE SHIELD | Admitting: Occupational Therapy

## 2016-01-08 ENCOUNTER — Telehealth: Payer: Self-pay | Admitting: Neurology

## 2016-01-08 ENCOUNTER — Encounter: Payer: Self-pay | Admitting: Occupational Therapy

## 2016-01-08 ENCOUNTER — Ambulatory Visit: Payer: BLUE CROSS/BLUE SHIELD

## 2016-01-08 ENCOUNTER — Other Ambulatory Visit: Payer: Self-pay

## 2016-01-08 DIAGNOSIS — R2991 Unspecified symptoms and signs involving the musculoskeletal system: Secondary | ICD-10-CM

## 2016-01-08 DIAGNOSIS — R278 Other lack of coordination: Secondary | ICD-10-CM

## 2016-01-08 DIAGNOSIS — R269 Unspecified abnormalities of gait and mobility: Secondary | ICD-10-CM

## 2016-01-08 DIAGNOSIS — M25511 Pain in right shoulder: Secondary | ICD-10-CM

## 2016-01-08 DIAGNOSIS — M25611 Stiffness of right shoulder, not elsewhere classified: Secondary | ICD-10-CM

## 2016-01-08 DIAGNOSIS — R29818 Other symptoms and signs involving the nervous system: Secondary | ICD-10-CM

## 2016-01-08 DIAGNOSIS — G8111 Spastic hemiplegia affecting right dominant side: Secondary | ICD-10-CM

## 2016-01-08 DIAGNOSIS — R29898 Other symptoms and signs involving the musculoskeletal system: Secondary | ICD-10-CM

## 2016-01-08 NOTE — Therapy (Signed)
[facility] [address] [place], [place], [address] Phone: [phone]   Fax:  [phone]  Physical Therapy Treatment  Patient Details  Name: Ashley [name] MRN: [id number] Date of Birth: 11/29/1975 No Data Recorded  Encounter Date: 01/08/2016      PT End of Session - 01/08/16 1117    Visit Number 81   Number of Visits 82   Date for PT Re-Evaluation 02/11/16   Authorization Type BCBS. Per Laverda Page, visits now 30 for PT and OT total (15 per OT and 15 per PT). Pt wishes to continue at self pay after visit limit met.    Authorization - Visit Number 24   Authorization - Number of Visits 16   PT Start Time [id number]   PT Stop Time 1011   PT Time Calculation (min) 40 min   Equipment Utilized During Treatment Gait belt   Activity Tolerance Patient tolerated treatment well   Behavior During Therapy WFL for tasks assessed/performed      Past Medical History  Diagnosis Date  . Anxiety   . ICH (intracerebral hemorrhage) ([place])   . Stroke ([place])   . Seizures [facility])     Past Surgical History  Procedure Laterality Date  . Dilation and curettage of uterus      There were no vitals filed for this visit.  Visit Diagnosis:  Abnormality of gait  Right leg weakness      Subjective Assessment - 01/08/16 0934    Subjective Pt denied falls or changes since last visit. Pt reported she was able to amb. over uneven terrain at lacrosse and soccer games and at restaurant this weekend.  Pt is a little anxious about going on cruise (01/21/16-01/27/16).   Pertinent History Seizures, low BP   Patient Stated Goals "Be as close to normal as much as possible and to go back to running" Get back to taking care of my kids, 40 weeks old and 72 and 71 years old   Currently in Pain? No/denies                         [facility] Adult PT Treatment/Exercise - 01/08/16 0937    Ambulation/Gait   Ambulation/Gait Yes   Ambulation/Gait  Assistance 5: Supervision;6: Modified independent (Device/Increase time)   Ambulation/Gait Assistance Details Pt amb. at MOD I level indoors and S-MOD I outdoors over paved surfaces (S while descending decline).   Ambulation Distance (Feet) 600 Feet  outdoors/indoors c RW, and 20'x3 no AD, 75'x2 c RW   Assistive device None;Rolling walker   Gait Pattern Decreased stance time - right;Decreased weight shift to right;Right foot flat;Decreased hip/knee flexion - right;Step-through pattern;Decreased step length - left;Trunk rotated posteriorly on right   Ambulation Surface Level;Unlevel;Indoor;Outdoor;Paved   Stairs Yes   Stairs Assistance 5: Supervision   Stairs Assistance Details (indicate cue type and reason) Cues to improve R hip/knee flexion to clear R foot to ascend stairs.   Stair Management Technique One rail Left;One rail Right;Alternating pattern   Number of Stairs 12   Height of Stairs 6   Curb 5: Supervision;Other (comment)  min guard   Curb Details (indicate cue type and reason) Cues to improve R knee/hip flexion, S to ascend and min guard to S to descend to ensure safety. Performed x3 reps on 6" curb indoors and x3 on 4" curb outdoors.   Standardized Balance Assessment   Standardized Balance Assessment Timed Up and Go Test;Dynamic Gait Index  Dynamic Gait Index   Level Surface Mild Impairment   Change in Gait Speed Mild Impairment   Gait with Horizontal Head Turns Mild Impairment   Gait with Vertical Head Turns Normal   Gait and Pivot Turn Mild Impairment   Step Over Obstacle Moderate Impairment   Step Around Obstacles Normal   Steps Mild Impairment   Total Score 17   Timed Up and Go Test   TUG Normal TUG   Normal TUG (seconds) 11.4  without AD and 14.5 with RW   Knee/Hip Exercises: Standing   Wall Squat 1 set;5 reps;5 seconds   Wall Squat Limitations Cues for equal wt. over BLEs and to hold for full 5 sec., cues to activate glute max to obtain full upright standing after  squat.   Other Standing Knee Exercises Mini squats with sidestepping to improve strength, with intermittent UE support on counter. 6x7' at counter with S for safety. Cues to improve wt. Shifting onto RLE.                PT Education - 01/08/16 1117    Education provided Yes   Education Details PT educated pt on goal progress and potential progression of HEP next session as tolerated.   Person(s) Educated Patient   Methods Explanation   Comprehension Verbalized understanding          PT Short Term Goals - 01/08/16 1119    PT SHORT TERM GOAL #1   Title Pt will be IND in HEP to improve strength, balance, and endurance. Target date: 01/10/16.   Status On-going   PT SHORT TERM GOAL #2   Title Pt will improve gait speed to >/=1.35ft/sec to decrease falls risk. Target date: 07/27/15.   Status Achieved   PT SHORT TERM GOAL #3   Title Pt will ambulate 300' with LRAD, over even terrain, at MOD I level to improve functional mobility. Target date: 07/27/15.   Status Achieved   PT SHORT TERM GOAL #4   Title Pt will perform TUG in </=13.5 seconds with LRAD to decr. falls risk. Target date: 01/10/15.   Status Achieved   PT SHORT TERM GOAL #5   Title Perform BERG and write STG and LTG as appropriate. Target date: 07/27/15.   Status Achieved   PT SHORT TERM GOAL #6   Title Pt will improve BERG to >/=51/56 to decr. falls risk. Target date: 07/27/15.   Status Achieved   PT SHORT TERM GOAL #7   Title Pt will amb. 500' over even/uneven terrain with LRAD at MOD I level to improve functional mobility. Target date: 01/10/16   Status Partially Met   PT SHORT TERM GOAL #8   Title Pt will ascend/descend curb with LRAD at MOD I level to improve functional mobility. Target date: 01/10/16   Status Partially Met   PT SHORT TERM GOAL #9   TITLE Pt will ascend/descend 12 steps with one handrail at MOD I level to improve functional mobility. Target date: 01/10/16   Status Partially Met   PT SHORT TERM  GOAL #10   TITLE Pt will incr. DGI score to >/=17/24 to decr. falls risk. Target date: 01/10/16   Baseline Revised on 12/20/15, as pt scored 14/24 on DGI.   Status Achieved           PT Long Term Goals - 12/20/15 1138    PT LONG TERM GOAL #1   Title Pt will ambulate 1000' over even/uneven terrain, with LRAD, at MOD I level  to improve functional mobiliity. Target date: 02/07/16/17.   Baseline All unmet goals will be carried over to new 8 week POC: 02/07/16   Status On-going   PT LONG TERM GOAL #2   Title Pt will amb. 300' over even terrain without an AD, IND, to safely amb. in home. Target date: 02/07/16   Status On-going   PT LONG TERM GOAL #3   Title Pt will improve gait speed to >/=2.15f/sec, with LRAD, to amb. safely in the community. Target date: 11/12/14.   Status Achieved   PT LONG TERM GOAL #4   Title Pt will verbalize plans to join a fitness center upon d/c from PT to continue to maintain strength and endurance gains made during PT. Target date: 02/07/16.   Status On-going   PT LONG TERM GOAL #5   Title Pt improve BERG score to >/=55/56 to decr. falls risk. Target date: 12/11/15   Status Achieved   PT LONG TERM GOAL #6   Title Pt will ascend/descend curb with LRAD at MOD I level to improve functional mobility. Target date: 02/07/16   Status On-going   PT LONG TERM GOAL #7   Title Pt will ascend/descend 8 steps with one handrail at MOD I level to improve functional mobility. Target date: 02/07/16   Status On-going   PT LONG TERM GOAL #8   Title Pt will ascend/descend ladder at MOD I level in order to transfer in/out of water. Target date: 02/07/16   Status On-going   PT LONG TERM GOAL  #9   TITLE Pt will be able to transfer on/off boat at MOD I level, and no LOB, in order improve functional mobility and participate in family activities. Target date: 02/07/16   Status On-going   PT LONG TERM GOAL  #10   TITLE Pt will be IIND in progressed HEP in order to improve balance, strength  and flexibility. Target date: 02/07/16   Status On-going   PT LONG TERM GOAL  #11   TITLE Pt will improve DGI score to >/=20/24 to decr. falls risk. Target date: 02/07/16   Status New               Plan - 01/08/16 1118    Clinical Impression Statement Pt demonstrated progress as she met STGs 4 and 10, pt partially met STGs 7-9. PT will assess and progress STG 1 next session. Pt would continue to benefit from skilled PT to improve safety during functional mobility.    Pt will benefit from skilled therapeutic intervention in order to improve on the following deficits Abnormal gait;Decreased endurance;Impaired sensation;Decreased knowledge of precautions;Decreased activity tolerance;Decreased knowledge of use of DME;Decreased strength;Impaired UE functional use;Impaired tone;Decreased balance;Decreased mobility;Decreased cognition;Decreased range of motion;Decreased safety awareness;Decreased coordination;Impaired flexibility;Postural dysfunction   Rehab Potential Good   Clinical Impairments Affecting Rehab Potential Seizures which may limit intensity of PT   PT Frequency 2x / week   PT Duration 8 weeks   PT Treatment/Interventions ADLs/Self Care Home Management;Neuromuscular re-education;Cognitive remediation;Biofeedback;DME Instruction;Gait training;Stair training;Canalith Repostioning;Patient/family education;Orthotic Fit/Training;Balance training;Therapeutic exercise;Manual techniques;Therapeutic activities;Vestibular   PT Next Visit Plan Assess STG 1 and progress prn, add mini wall squats with sidestepping and wall squats   PT Home Exercise Plan Stretching/strength/balance HEP   Consulted and Agree with Plan of Care Patient        Problem List Patient Active Problem List   Diagnosis Date Noted  . Spastic hemiplegia and hemiparesis affecting dominant side ([facility] [place] 09/13/2015  . Adhesive capsulitis of right shoulder 08/24/2015  .  Symptomatic partial epilepsy with simple partial  seizures ([place]) 08/21/2015  . Nontraumatic cortical hemorrhage of cerebral hemisphere ([place])   . Seizure ([place])   . Seizures ([place])   . Adjustment disorder with depressed mood   . Contracture of muscle ankle and foot 06/11/2015  . Hemiplga fol ntrm intcrbl hemor aff right dominant side 06/06/2015  . Left-sided intracerebral hemorrhage ([place]) 06/04/2015  . Seizure disorder as sequela of cerebrovascular accident ([place])   . Seizure disorder ([place]) 06/03/2015  . Sepsis ([place]) 06/03/2015  . UTI (urinary tract infection) 06/03/2015  . Hypotension 06/02/2015  . Right spastic hemiparesis ([place]) 05/28/2015  . 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 History of anxiety disorder 05/28/2015  . ICH (intracerebral hemorrhage) ([place])   . Seizure disorder, nonconvulsive, with status epilepticus ([place])   . Cerebral venous thrombosis of cortical vein   . Cytotoxic cerebral edema ([place])   . IVH (intraventricular hemorrhage) ([place])   . Term pregnancy 05/07/2015  . Spontaneous vaginal delivery 05/07/2015    Rayya Yagi L 01/08/2016, 11:21 AM  [facility] [address] [place], [place], [address] Phone: [phone]   Fax:  [phone]  Name: Sharetta Ricchio MRN: [id number] Date of Birth: [birth year]     Geoffry Paradise, PT,DPT 01/08/2016 11:21 AM Phone: [phone] Fax: [phone]

## 2016-01-08 NOTE — Telephone Encounter (Signed)
Rn call patients husband to state DME order was fax to [facility].

## 2016-01-08 NOTE — Telephone Encounter (Signed)
Spouse called to request order be sent to [facility] for walk aid. Order needs to say "to evaluate and fit for walk aid" Fax# [phone].

## 2016-01-08 NOTE — Therapy (Signed)
[facility] [address] [place], [place], [address] Phone: [phone]   Fax:  [phone]  Occupational Therapy Treatment  Patient Details  Name: Ashley [name] MRN: [id number] Date of Birth: 12-Nov-1975 No Data Recorded  Encounter Date: 01/08/2016      OT End of Session - 01/08/16 1244    Visit Number 24   Number of Visits 33   Date for OT Re-Evaluation 02/05/16   Authorization Type BCBS   Authorization Time Period 30 visit COMBINED between PT/OT;  pt has utilized benefit and is now self pay.    Authorization - Visit Number 24   Authorization - Number of Visits 82   OT Start Time 4383   OT Stop Time 1101   OT Time Calculation (min) 46 min   Activity Tolerance Patient tolerated treatment well      Past Medical History  Diagnosis Date  . Anxiety   . ICH (intracerebral hemorrhage) ([place])   . Stroke ([place])   . Seizures [facility])     Past Surgical History  Procedure Laterality Date  . Dilation and curettage of uterus      There were no vitals filed for this visit.  Visit Diagnosis:  Right spastic hemiparesis ([facility])  Stiffness of joint, shoulder region, right  Poor trunk control  Pain in joint of right shoulder  Coordination impairment      Subjective Assessment - 01/08/16 1018    Subjective  I walked across the fields to my son's game this weekend by myself   Pertinent History see epic snapshot; pt with SAH 2 weeks after deliverying her 3rd child   Patient Stated Goals to be able to use my R arm normally                      OT Treatments/Exercises (OP) - 01/08/16 0001    Neurological Re-education Exercises   Other Exercises 1 Neuro re ed to address alignment in kneeling, standing and functional ambulation while carrying increasing weight up to 14 pounds. Pt able to comfortably carry 10 pounds a  household distance and 14 pounds short distances.  Pt needs mod vcs' and min tactile cues to  improve alignment, encourage complete weight shift to the left and for trunk activiation.  Also addressed core stability in kneeling lifting 10 pound weight bilaterally and then lifting 10 pound in left hand and 5 pound in right hand simultaneously while moving into kneeling and squat kneeling.  Pt with improving core control and improving ability to self correct for alignment and increase use of L side during functional mobility.  Also addressed lifting weighted object with RUE to shelf at 130* up to 4 pound weight.                    OT Short Term Goals - 01/08/16 1239    OT SHORT TERM GOAL  #11   TITLE Pt will be mod I with upgraded HEP - 01/08/2016   Status Achieved   OT SHORT TERM GOAL  #12   TITLE Pt will be able to pick up 8 pound object x 4 trials bilaterally to place on overhead shelf requiring at least 120* of shoulder flexion (for household mgmt tasks, grocery shopping ,etc)   Status Achieved   OT SHORT TERM GOAL  #13   TITLE Pt will be able to lift lightweight object unilaterally with RUE to 120* of shoulder flexion (baseline 115*)   Status Achieved  able to lift 4 pound object to this height with very minimal compensations   OT SHORT TERM GOAL  #14   TITLE Pt will be able to lift baby into and out of tub while in kneeling from and to baby seat using both UE's   Status On-going           OT Long Term Goals - 01/08/16 1240    OT LONG TERM GOAL #7   Status Partially Met   OT LONG TERM GOAL  #10   TITLE Pt will be mod I with upgraded HEP - 02/05/2016   Status On-going   OT LONG TERM GOAL  #11   TITLE Pt will be able to pick up 5 pound object with RUE to place on overhead shelf x4 trials that requres at least 120* of shoulder flexion (for home mgmt tasks, care of child, volunteering)   Status On-going   OT LONG TERM GOAL  #12   TITLE Pt will be able to carry at least a 15 pound object using both hands without drops for 20 feet.   Status On-going   OT LONG TERM GOAL   #13   TITLE Pt will be able to get off toilet without using UE's (to allow her to use public restrooms, boat bathroom that are without grab bars)   Status Achieved               Plan - 01/08/16 1241    Clinical Impression Statement Pt making excellent progress toward goals.  Pt is very motivated to work on focus activities at home.    Pt will benefit from skilled therapeutic intervention in order to improve on the following deficits (Retired) Abnormal gait;Decreased coordination;Decreased range of motion;Difficulty walking;Impaired flexibility;Decreased endurance;Decreased safety awareness;Increased edema;Impaired tone;Decreased knowledge of precautions;Decreased activity tolerance;Decreased balance;Decreased knowledge of use of DME;Pain;Impaired UE functional use;Impaired vision/preception;Decreased cognition;Decreased mobility;Decreased strength   Rehab Potential Good   Clinical Impairments Affecting Rehab Potential cognition, impaired sensation and anxiety   OT Frequency 2x / week   OT Duration 8 weeks   OT Treatment/Interventions Self-care/ADL training;Therapeutic exercise;Patient/family education;Balance training;Ultrasound;Neuromuscular education;Manual Therapy;Splinting;Therapeutic exercises;Energy conservation;Parrafin;DME and/or AE instruction;Therapeutic activities;Cognitive remediation/compensation;Gait Training;Fluidtherapy;Electrical Stimulation;Moist Heat;Contrast Bath;Passive range of motion;Visual/perceptual remediation/compensation   Plan NMR for bilateral and unilateral reach with strength, trunk control, transitional movements, alignment for functional ambulation, carrying and walking   Consulted and Agree with Plan of Care Patient        Problem List Patient Active Problem List   Diagnosis Date Noted  . Spastic hemiplegia and hemiparesis affecting dominant side ([place]) 09/13/2015  . Adhesive capsulitis of right shoulder 08/24/2015  . Symptomatic partial epilepsy  with simple partial seizures ([place]) 08/21/2015  . Nontraumatic cortical hemorrhage of cerebral hemisphere ([facility])   . Seizure ([place])   . Seizures ([place])   . Adjustment disorder with depressed mood   . Contracture of muscle ankle and foot 06/11/2015  . Hemiplga fol ntrm intcrbl hemor aff right dominant side 06/06/2015  . Left-sided intracerebral hemorrhage ([place]) 06/04/2015  . Seizure disorder as sequela of cerebrovascular accident ([place])   . Seizure disorder ([place]) 06/03/2015  . Sepsis ([place]) 06/03/2015  . UTI (urinary tract infection) 06/03/2015  . Hypotension 06/02/2015  . Right spastic hemiparesis ([place]) 05/28/2015  . 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 History of anxiety disorder 05/28/2015  . ICH (intracerebral hemorrhage) ([place])   . Seizure disorder, nonconvulsive, with status epilepticus ([facility])   . Cerebral venous thrombosis of cortical vein   .  Cytotoxic cerebral edema ([place])   . IVH (intraventricular hemorrhage) ([place])   . Term pregnancy 05/07/2015  . Spontaneous vaginal delivery 05/07/2015    Quay Burow, OTR/L 01/08/2016, 12:49 PM  [facility] [address] [place], [place], [address] Phone: [phone]   Fax:  [phone]  Name: Ashley [name] MRN: [id number] Date of Birth: June 30, 1976

## 2016-01-08 NOTE — Telephone Encounter (Signed)
Rn call patients husband back about his wife need orders for walk aid to be fax to [facility]. The [facility] will send evaluate the order for DME to see if the insurance will cover it. The device is walk aid, and it needs to say to evaluate and fit for walk aid per husband.

## 2016-01-08 NOTE — Telephone Encounter (Signed)
Order fax to [facility] at [phone]. Fax receive and conirm.

## 2016-01-10 ENCOUNTER — Ambulatory Visit (INDEPENDENT_AMBULATORY_CARE_PROVIDER_SITE_OTHER): Payer: BLUE CROSS/BLUE SHIELD | Admitting: Psychology

## 2016-01-10 ENCOUNTER — Ambulatory Visit: Payer: BLUE CROSS/BLUE SHIELD

## 2016-01-10 ENCOUNTER — Encounter: Payer: Self-pay | Admitting: Occupational Therapy

## 2016-01-10 ENCOUNTER — Ambulatory Visit: Payer: BLUE CROSS/BLUE SHIELD | Admitting: Occupational Therapy

## 2016-01-10 DIAGNOSIS — I611 Nontraumatic intracerebral hemorrhage in hemisphere, cortical: Secondary | ICD-10-CM

## 2016-01-10 DIAGNOSIS — F321 Major depressive disorder, single episode, moderate: Secondary | ICD-10-CM | POA: Diagnosis not present

## 2016-01-10 DIAGNOSIS — M25511 Pain in right shoulder: Secondary | ICD-10-CM

## 2016-01-10 DIAGNOSIS — G8111 Spastic hemiplegia affecting right dominant side: Secondary | ICD-10-CM | POA: Diagnosis not present

## 2016-01-10 DIAGNOSIS — R278 Other lack of coordination: Secondary | ICD-10-CM

## 2016-01-10 DIAGNOSIS — R2991 Unspecified symptoms and signs involving the musculoskeletal system: Secondary | ICD-10-CM

## 2016-01-10 DIAGNOSIS — R2689 Other abnormalities of gait and mobility: Secondary | ICD-10-CM

## 2016-01-10 DIAGNOSIS — R29818 Other symptoms and signs involving the nervous system: Secondary | ICD-10-CM

## 2016-01-10 DIAGNOSIS — M25611 Stiffness of right shoulder, not elsewhere classified: Secondary | ICD-10-CM

## 2016-01-10 NOTE — Therapy (Signed)
[facility] [address] [place], [place], [address] Phone: [phone]   Fax:  [phone]  Occupational Therapy Treatment  Patient Details  Name: Ashley [name] MRN: [id number] Date of Birth: Mar 29, 1976 No Data Recorded  Encounter Date: 01/10/2016      OT End of Session - 01/10/16 1216    Visit Number 25   Number of Visits 33   Date for OT Re-Evaluation 02/05/16   Authorization Type BCBS   Authorization Time Period 30 visit COMBINED between PT/OT;  pt has utilized benefit and is now self pay.    OT Start Time 1015   OT Stop Time 1102   OT Time Calculation (min) 47 min      Past Medical History  Diagnosis Date  . Anxiety   . ICH (intracerebral hemorrhage) ([place])   . Stroke ([place])   . Seizures [facility])     Past Surgical History  Procedure Laterality Date  . Dilation and curettage of uterus      There were no vitals filed for this visit.  Visit Diagnosis:  Right spastic hemiparesis (HCC)  Stiffness of joint, shoulder region, right  Poor trunk control  Pain in joint of right shoulder  Coordination impairment      Subjective Assessment - 01/10/16 1023    Subjective  I am going on cruise with my husband soon!   Pertinent History see epic snapshot; pt with [facility] 2 weeks after deliverying her 3rd child   Patient Stated Goals to be able to use my R arm normally   Currently in Pain? No/denies                      OT Treatments/Exercises (OP) - 01/10/16 0001    Neurological Re-education Exercises   Other Exercises 1 Neuro re ed to address scapular stability/proximal strength with overhead reach using weight, resistance. also addressed core control and UE strength in plank and push up to work toward pt's ability to lift heavier objects overhead.                  OT Short Term Goals - 01/10/16 1215    OT SHORT TERM GOAL  #11   TITLE Pt will be mod I with upgraded HEP - 01/08/2016   Status Achieved   OT SHORT TERM GOAL  #12   TITLE Pt will be able to pick up 8 pound object x 4 trials bilaterally to place on overhead shelf requiring at least 120* of shoulder flexion (for household mgmt tasks, grocery shopping ,etc)   Status Achieved   OT SHORT TERM GOAL  #13   TITLE Pt will be able to lift lightweight object unilaterally with RUE to 120* of shoulder flexion (baseline 115*)   Status Achieved  able to lift 4 pound object to this height with very minimal compensations   OT SHORT TERM GOAL  #14   TITLE Pt will be able to lift baby into and out of tub while in kneeling from and to baby seat using both UE's   Status On-going           OT Long Term Goals - 01/10/16 1215    OT LONG TERM GOAL #7   Status Partially Met   OT LONG TERM GOAL  #10   TITLE Pt will be mod I with upgraded HEP - 02/05/2016   Status On-going   OT LONG TERM GOAL  #11   TITLE Pt will be  able to pick up 5 pound object with RUE to place on overhead shelf x4 trials that requres at least 120* of shoulder flexion (for home mgmt tasks, care of child, volunteering)   Status On-going   OT LONG TERM GOAL  #12   TITLE Pt will be able to carry at least a 15 pound object using both hands without drops for 20 feet.   Status On-going   OT LONG TERM GOAL  #13   TITLE Pt will be able to get off toilet without using UE's (to allow her to use public restrooms, boat bathroom that are without grab bars)   Status Achieved               Plan - 01/10/16 1215    Clinical Impression Statement Pt continues to make progress toward goals and is very motivated to improve.    Pt will benefit from skilled therapeutic intervention in order to improve on the following deficits (Retired) Abnormal gait;Decreased coordination;Decreased range of motion;Difficulty walking;Impaired flexibility;Decreased endurance;Decreased safety awareness;Increased edema;Impaired tone;Decreased knowledge of precautions;Decreased activity  tolerance;Decreased balance;Decreased knowledge of use of DME;Pain;Impaired UE functional use;Impaired vision/preception;Decreased cognition;Decreased mobility;Decreased strength   Rehab Potential Good   Clinical Impairments Affecting Rehab Potential cognition, impaired sensation and anxiety   OT Frequency 2x / week   OT Duration 8 weeks   OT Treatment/Interventions Self-care/ADL training;Therapeutic exercise;Patient/family education;Balance training;Ultrasound;Neuromuscular education;Manual Therapy;Splinting;Therapeutic exercises;Energy conservation;Parrafin;DME and/or AE instruction;Therapeutic activities;Cognitive remediation/compensation;Gait Training;Fluidtherapy;Electrical Stimulation;Moist Heat;Contrast Bath;Passive range of motion;Visual/perceptual remediation/compensation   Plan NMR for bilateral and unilateral overhead reach with strength, trunk control, transitional movements, alignment for functional ambulation, carrying and walking   Consulted and Agree with Plan of Care Patient        Problem List Patient Active Problem List   Diagnosis Date Noted  . Spastic hemiplegia and hemiparesis affecting dominant side ([place]) 09/13/2015  . Adhesive capsulitis of right shoulder 08/24/2015  . Symptomatic partial epilepsy with simple partial seizures ([place]) 08/21/2015  . Nontraumatic cortical hemorrhage of cerebral hemisphere ([place])   . Seizure ([place])   . Seizures ([place])   . Adjustment disorder with depressed mood   . Contracture of muscle ankle and foot 06/11/2015  . Hemiplga fol ntrm intcrbl hemor aff right dominant side 06/06/2015  . Left-sided intracerebral hemorrhage ([place]) 06/04/2015  . Seizure disorder as sequela of cerebrovascular accident ([place])   . Seizure disorder ([place]) 06/03/2015  . Sepsis ([place]) 06/03/2015  . UTI (urinary tract infection) 06/03/2015  . Hypotension 06/02/2015  . Right spastic hemiparesis ([place]) 05/28/2015  . Aphasia following nontraumatic intracerebral hemorrhage  05/28/2015  . History of anxiety disorder 05/28/2015  . ICH (intracerebral hemorrhage) ([place])   . Seizure disorder, nonconvulsive, with status epilepticus ([place])   . Cerebral venous thrombosis of cortical vein   . Cytotoxic cerebral edema ([place])   . IVH (intraventricular hemorrhage) ([place])   . Term pregnancy 05/07/2015  . Spontaneous vaginal delivery 05/07/2015    Quay Burow, OTR/L 01/10/2016, 12:18 PM  [facility] [address] [place], [place], [address] Phone: [phone]   Fax:  [phone]  Name: Ashley [name] MRN: [id number] Date of Birth: 12-19-1975

## 2016-01-10 NOTE — Patient Instructions (Signed)
Wall Squat    Feet shoulder width apart, _12-18___ inches in front of wall, lean against wall. Heels on floor, knees parallel, bend hips and knees to almost 45, progress to 90 degrees as it becomes easier. Hold __10__ seconds.  Repeat __10__ times. Do __1__ sessions per day, perform 3-4 times a week.  Copyright  VHI. All rights reserved.   Squat with sidestep:   With hands on counter as needed, perform mini squat and then sidestep 5 times to the Right side and then sidestep 5 times to the Left side. Repeat sequence. Perform 3-4 times per week.

## 2016-01-10 NOTE — Progress Notes (Addendum)
[facility]  [address]   Telephone [phone] [address] Fax [phone] [place], [place] [address]   [place]  Name:   Ashley [name] Date of Birth:   05/09/1976 [facility] MR#:  [id number] Date of Evaluation: 01/10/16  Reason for Referral Ashley [name] is a 40 year-old woman with a history of a left brain stroke in August 2016 who was referred for psychological evaluation by Marton Redwood MD of [facility].   Review of Medical records Ashley [name] suffered a left frontal-parietal intracerebral hemorrhage on 05/20/15 that occurred two weeks after she gave birth to her third child. She subsequently displayed right hemiparesis, aphasia and seizure activity. She was on the [facility] inpatient rehabilitation unit until 06/27/15. Fluoxetine was started while she was on inpatient rehab to address mood. She has not had any recurrent seizures since her hospitalization. She has been working with outpatient OT, PT and SLP rehabilitative therapies since shortly after her inpatient rehab discharge. Problem list included gait disorder with right foot drop, right spastic hemiparesis, right shoulder pain, reduced short-term memory, impaired divided attention and depression. She has seen a psychiatric nurse practitioner and psychological counselor since her stroke. Her current medications include levetiracetam and Trintellix (replaced fluoxetine in February 2017).   Interview At the outset of the meeting, she asked that her husband join the session "to help him understand me". She was convinced that it would be best to meet individually, at least for the initial session.   She acknowledged ongoing depression characterized by sad mood, tearfulness, minimal  experience of pleasure, feelings of helplessness, reduced appetite with weight loss, discouragement about losing her previously independent lifestyle and worry about the uncertainty of the extent of her  physical and cognitive recovery. She denied suicidal thoughts. In addition to her physical limitations, she is aware of difficulties with multi-tasking and memory since her stroke. She did not report being in any pain. She reported feeling self-conscious about how she looks while walking. She wishes she could be active with her children. She described her husband as having been supportive though not really understanding her challenges. For example, she cited his arranging a family cruise vacation without consulting her. She denied experiencing mania, undue anxiety, irritability, racing thoughts, aggressive impulses, compulsive behavior, unusual beliefs, delusions or hallucinations. She did not cite any ongoing stressors or negative events other than her stroke.  Since her stroke, she has been seen by a psychiatric nurse for medication management. Her antidepressant medication was changed from fluoxetine to Trintellix in February 2017. She has met four times with a psychological counselor. She told her Occupational Therapist here that she was interested in being seen by a mental health counselor who has experience working with survivors of stroke.  She lives with her husband of three years, Mr. "[name]" Loma. They have an 24 old child together. She has two children, ages 69 and six, from her first marriage. She reported that her first marriage ended in divorce after eight years, in part due to her ex-husband's increasing abuse of alcohol. 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 She reported that her husband is a Engineer, agricultural of several businesses. She reported that she earned a degree in Soil scientist. Her past medical history was unremarkable other than stroke. She reported that she has consumed alcohol in social settings without history of abuse. She denied use  of illicit  drugs or tobacco products. With regards to her mental health history, she reported that a few years ago she was treated with fluoxetine for obsessive compulsive tendencies, primarily excessive checking, that began while she was in college. She denied history of serious depression, mood instability and suicidal behavior. She did not report any past or current legal issues.  Observations She appeared as an appropriately dressed and groomed thin woman. Gait was abnormal due to right sided weakness. She did not display signs of pain behavior or any unusual mannerisms while seated. She interacted in a pleasant manner. She cried while conveying the effects of her stroke on her quality of life and her future. Her affect was congruent with her thoughts. No problems were evident for expressive or receptive language. She was oriented in all spheres. Her thought processes were coherent and logical. There were no signs of loose associations, verbal perseverations or flight of ideas. Her thought content was devoid of unusual ideas or delusions.   Diagnostic Impression Major depressive disorder, single episode, moderate (Post-stroke depression) [F32.1]  Impressions & Plan [name] reported a clinically significant level of depression, primarily in reaction to her stroke of 05/20/15 and its aftermath. There were no indications based on her self-report that she is at risk to harm self or others.   We mutually agreed to begin weekly to bi-monthly psychotherapy. She stated that she had decided to discontinue treatment with her current psychological counselor (seen four times) and transfer to the undersigned clinician. She will continue to see Leslie O'Neal, FNP for psychiatric medication management. Even though she denied suicidal ideation, she was instructed to call 911 or go to the nearest hospital Emergency Department if she were to feel suicidal. She was scheduled for a return visit in one week.   I have  appreciated the opportunity to evaluate [name]. Please feel free to contact me with any comments or questions.    ___________________  F. , Ph.D Licensed Psychologist      Copy: William Shaw MD [facility]     

## 2016-01-10 NOTE — Therapy (Signed)
[facility] [address] [place], [place], [address] Phone: [phone]   Fax:  [phone]  Physical Therapy Treatment  Patient Details  Name: Ashley [name] MRN: [id number] Date of Birth: [birth year] No Data Recorded  Encounter Date: 01/10/2016      PT End of Session - 01/10/16 1207    Visit Number 52   Number of Visits 30   Date for PT Re-Evaluation 02/11/16   Authorization Type BCBS. Per Laverda Page, visits now 30 for PT and OT total (15 per OT and 15 per PT). Pt wishes to continue at self pay after visit limit met.    Authorization - Visit Number 25   Authorization - Number of Visits 16   PT Start Time 1107   PT Stop Time 1149   PT Time Calculation (min) 42 min   Equipment Utilized During Treatment --  min guard to S   Activity Tolerance Patient tolerated treatment well   Behavior During Therapy [facility] for tasks assessed/performed      Past Medical History  Diagnosis Date  . Anxiety   . ICH (intracerebral hemorrhage) ([place])   . Stroke ([place])   . Seizures [facility])     Past Surgical History  Procedure Laterality Date  . Dilation and curettage of uterus      There were no vitals filed for this visit.  Visit Diagnosis:  Other abnormalities of gait and mobility  Spastic hemiplegia affecting right dominant side (HCC)      Subjective Assessment - 01/10/16 1108    Subjective Pt denied falls or changes since last visit.    Patient Stated Goals "Be as close to normal as much as possible and to go back to running" Get back to taking care of my kids, 69 weeks old and 93 and 12 years old   Currently in Pain? No/denies         Therex: Pt performed wall squats and squats with sidestepping at // bars, please see pt instructions for details.  Pt propelled stool, while seated on stool, with B hamstrings and anterior tib activation to improve hamstring and DF strength. 6x7'. Standing in //bars with B UE support:  heel/toe raises with cues to decr. Trunk flexion.  Cues for technique.                        PT Education - 01/10/16 1154    Education provided Yes   Education Details PT and pt spent first part of session discussing pt trialing Walk Aide at [facility], per Dr. Clydene Fake suggestion. PT re-educated pt on potential risk of e-stim triggering a seizure in pt's with history of seizures. Pt verbalize understanding. PT reiterated that it is pt's decision and PT will work with pt and [facility] for gait training, as cleared by MD.    Terence Lux) Educated Patient   Methods Explanation   Comprehension Verbalized understanding          PT Short Term Goals - 01/08/16 1119    PT SHORT TERM GOAL #1   Title Pt will be IND in HEP to improve strength, balance, and endurance. Target date: 01/10/16.   Status On-going   PT SHORT TERM GOAL #2   Title Pt will improve gait speed to >/=1.30f/sec to decrease falls risk. Target date: 07/27/15.   Status Achieved   PT SHORT TERM GOAL #3   Title Pt will ambulate 300' with LRAD, over even terrain, at MOD  I level to improve functional mobility. Target date: 07/27/15.   Status Achieved   PT SHORT TERM GOAL #4   Title Pt will perform TUG in </=13.5 seconds with LRAD to decr. falls risk. Target date: 01/10/15.   Status Achieved   PT SHORT TERM GOAL #5   Title Perform BERG and write STG and LTG as appropriate. Target date: 07/27/15.   Status Achieved   PT SHORT TERM GOAL #6   Title Pt will improve BERG to >/=51/56 to decr. falls risk. Target date: 07/27/15.   Status Achieved   PT SHORT TERM GOAL #7   Title Pt will amb. 500' over even/uneven terrain with LRAD at MOD I level to improve functional mobility. Target date: 01/10/16   Status Partially Met   PT SHORT TERM GOAL #8   Title Pt will ascend/descend curb with LRAD at MOD I level to improve functional mobility. Target date: 01/10/16   Status Partially Met   PT SHORT TERM GOAL #9   TITLE  Pt will ascend/descend 12 steps with one handrail at MOD I level to improve functional mobility. Target date: 01/10/16   Status Partially Met   PT SHORT TERM GOAL #10   TITLE Pt will incr. DGI score to >/=17/24 to decr. falls risk. Target date: 01/10/16   Baseline Revised on 12/20/15, as pt scored 14/24 on DGI.   Status Achieved           PT Long Term Goals - 12/20/15 1138    PT LONG TERM GOAL #1   Title Pt will ambulate 1000' over even/uneven terrain, with LRAD, at MOD I level to improve functional mobiliity. Target date: 02/07/16/17.   Baseline All unmet goals will be carried over to new 8 week POC: 02/07/16   Status On-going   PT LONG TERM GOAL #2   Title Pt will amb. 300' over even terrain without an AD, IND, to safely amb. in home. Target date: 02/07/16   Status On-going   PT LONG TERM GOAL #3   Title Pt will improve gait speed to >/=2.13f/sec, with LRAD, to amb. safely in the community. Target date: 11/12/14.   Status Achieved   PT LONG TERM GOAL #4   Title Pt will verbalize plans to join a fitness center upon d/c from PT to continue to maintain strength and endurance gains made during PT. Target date: 02/07/16.   Status On-going   PT LONG TERM GOAL #5   Title Pt improve BERG score to >/=55/56 to decr. falls risk. Target date: 12/11/15   Status Achieved   PT LONG TERM GOAL #6   Title Pt will ascend/descend curb with LRAD at MOD I level to improve functional mobility. Target date: 02/07/16   Status On-going   PT LONG TERM GOAL #7   Title Pt will ascend/descend 8 steps with one handrail at MOD I level to improve functional mobility. Target date: 02/07/16   Status On-going   PT LONG TERM GOAL #8   Title Pt will ascend/descend ladder at MOD I level in order to transfer in/out of water. Target date: 02/07/16   Status On-going   PT LONG TERM GOAL  #9   TITLE Pt will be able to transfer on/off boat at MOD I level, and no LOB, in order improve functional mobility and participate in family  activities. Target date: 02/07/16   Status On-going   PT LONG TERM GOAL  #10   TITLE Pt will be IIND in progressed HEP in order to  improve balance, strength and flexibility. Target date: 02/07/16   Status On-going   PT LONG TERM GOAL  #11   TITLE Pt will improve DGI score to >/=20/24 to decr. falls risk. Target date: 02/07/16   Status New               Plan - 01/10/16 1207    Clinical Impression Statement PT assessed pt's R ankle DF and eversion, DF: 2/5 and 0/5 eversion. PT explained that pt's ROM is also limited by incr. R LE tone and encouraged pt to continue stretching program. PT noted pt has incr. R LE edema distal to patella on medial and lateral aspects, pt has similar edema on LLE and it is not painful, PT will continue to monitor. Continue with POC.    Pt will benefit from skilled therapeutic intervention in order to improve on the following deficits Abnormal gait;Decreased endurance;Impaired sensation;Decreased knowledge of precautions;Decreased activity tolerance;Decreased knowledge of use of DME;Decreased strength;Impaired UE functional use;Impaired tone;Decreased balance;Decreased mobility;Decreased cognition;Decreased range of motion;Decreased safety awareness;Decreased coordination;Impaired flexibility;Postural dysfunction   Rehab Potential Good   Clinical Impairments Affecting Rehab Potential Seizures which may limit intensity of PT   PT Frequency 2x / week   PT Duration 8 weeks   PT Treatment/Interventions ADLs/Self Care Home Management;Neuromuscular re-education;Cognitive remediation;Biofeedback;DME Instruction;Gait training;Stair training;Canalith Repostioning;Patient/family education;Orthotic Fit/Training;Balance training;Therapeutic exercise;Manual techniques;Therapeutic activities;Vestibular   PT Next Visit Plan Perform hamstring curls on stool with active B DF, rockerboard to mimic boat cruise   PT Home Exercise Plan Stretching/strength/balance HEP   Consulted and  Agree with Plan of Care Patient        Problem List Patient Active Problem List   Diagnosis Date Noted  . Spastic hemiplegia and hemiparesis affecting dominant side ([place]) 09/13/2015  . Adhesive capsulitis of right shoulder 08/24/2015  . Symptomatic partial epilepsy with simple partial seizures ([place]) 08/21/2015  . Nontraumatic cortical hemorrhage of cerebral hemisphere ([place])   . Seizure ([place])   . Seizures ([place])   . Adjustment disorder with depressed mood   . Contracture of muscle ankle and foot 06/11/2015  . Hemiplga fol ntrm intcrbl hemor aff right dominant side 06/06/2015  . Left-sided intracerebral hemorrhage ([place]) 06/04/2015  . Seizure disorder as sequela of cerebrovascular accident ([place])   . Seizure disorder ([place]) 06/03/2015  . Sepsis ([place]) 06/03/2015  . UTI (urinary tract infection) 06/03/2015  . Hypotension 06/02/2015  . Right spastic hemiparesis ([place]) 05/28/2015  . 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 History of anxiety disorder 05/28/2015  . ICH (intracerebral hemorrhage) ([place])   . Seizure disorder, nonconvulsive, with status epilepticus ([place])   . Cerebral venous thrombosis of cortical vein   . Cytotoxic cerebral edema ([place])   . IVH (intraventricular hemorrhage) ([place])   . Term pregnancy 05/07/2015  . Spontaneous vaginal delivery 05/07/2015    Miller,Jennifer L 01/10/2016, 12:57 PM  [facility] [address] [place], [place], [address] Phone: [phone]   Fax:  [phone]  Name: Ashley [name] MRN: [id number] Date of Birth: [birth year]    Geoffry Paradise, PT,DPT 01/10/2016 12:57 PM Phone: [phone] Fax: [phone]

## 2016-01-15 ENCOUNTER — Encounter: Payer: Self-pay | Admitting: Occupational Therapy

## 2016-01-15 ENCOUNTER — Ambulatory Visit: Payer: BLUE CROSS/BLUE SHIELD

## 2016-01-15 ENCOUNTER — Ambulatory Visit: Payer: BLUE CROSS/BLUE SHIELD | Attending: Psychology | Admitting: Occupational Therapy

## 2016-01-15 DIAGNOSIS — F4321 Adjustment disorder with depressed mood: Secondary | ICD-10-CM | POA: Diagnosis present

## 2016-01-15 DIAGNOSIS — F4323 Adjustment disorder with mixed anxiety and depressed mood: Secondary | ICD-10-CM | POA: Diagnosis present

## 2016-01-15 DIAGNOSIS — G8111 Spastic hemiplegia affecting right dominant side: Secondary | ICD-10-CM | POA: Diagnosis not present

## 2016-01-15 DIAGNOSIS — F321 Major depressive disorder, single episode, moderate: Secondary | ICD-10-CM | POA: Diagnosis present

## 2016-01-15 DIAGNOSIS — R208 Other disturbances of skin sensation: Secondary | ICD-10-CM | POA: Insufficient documentation

## 2016-01-15 DIAGNOSIS — M25511 Pain in right shoulder: Secondary | ICD-10-CM | POA: Diagnosis present

## 2016-01-15 DIAGNOSIS — R293 Abnormal posture: Secondary | ICD-10-CM | POA: Insufficient documentation

## 2016-01-15 DIAGNOSIS — F429 Obsessive-compulsive disorder, unspecified: Secondary | ICD-10-CM | POA: Diagnosis present

## 2016-01-15 DIAGNOSIS — R41844 Frontal lobe and executive function deficit: Secondary | ICD-10-CM | POA: Diagnosis present

## 2016-01-15 DIAGNOSIS — R2689 Other abnormalities of gait and mobility: Secondary | ICD-10-CM | POA: Diagnosis present

## 2016-01-15 DIAGNOSIS — R2991 Unspecified symptoms and signs involving the musculoskeletal system: Secondary | ICD-10-CM | POA: Insufficient documentation

## 2016-01-15 DIAGNOSIS — M25611 Stiffness of right shoulder, not elsewhere classified: Secondary | ICD-10-CM | POA: Insufficient documentation

## 2016-01-15 NOTE — Therapy (Signed)
[facility] [address] [place], [place], [address] Phone: [phone]   Fax:  [phone]  Occupational Therapy Treatment  Patient Details  Name: Ashley [name] MRN: [id number] Date of Birth: Mar 30, 1976 No Data Recorded  Encounter Date: 01/15/2016      OT End of Session - 01/15/16 1112    Visit Number 26   Number of Visits 33   Date for OT Re-Evaluation 02/05/16   Authorization Type BCBS   Authorization Time Period 30 visit COMBINED between PT/OT;  pt has utilized benefit and is now self pay.    OT Start Time 0930   OT Stop Time 1014   OT Time Calculation (min) 44 min      Past Medical History  Diagnosis Date  . Anxiety   . ICH (intracerebral hemorrhage) ([place])   . Stroke ([place])   . Seizures [facility])     Past Surgical History  Procedure Laterality Date  . Dilation and curettage of uterus      There were no vitals filed for this visit.  Visit Diagnosis:  Spastic hemiplegia affecting right dominant side (HCC)  Stiffness of joint, shoulder region, right  Poor trunk control  Pain in joint of right shoulder      Subjective Assessment - 01/15/16 0938    Subjective  I got the walk aide and I am not sure I want to keep it.    Pertinent History see epic snapshot; pt with SAH 2 weeks after deliverying her 3rd child   Patient Stated Goals to be able to use my R arm normally   Currently in Pain? No/denies                      OT Treatments/Exercises (OP) - 01/15/16 0001    Neurological Re-education Exercises   Other Exercises 1 Neuro re ed to address trunk and scapula strength and control with overhead unilateral reach.  Pt with greatly improved AROM for overhead reach with min compensations and can achieve approximately 175* of shoulder flexion in closed chain light activity with min compensations hower pt has impaired strength proximally when reaching overhead.  Addressed alignment of shoulder  girdle with focus on increased activity around scap for mobile stability as well as normal glenohumeral rythm for overehead reach with and without resistance. Pt tends to hike shoulder as soon as resistance is intrduced but today was able to inhibit this after repetition and practice.                    OT Short Term Goals - 01/15/16 1109    OT SHORT TERM GOAL  #11   TITLE Pt will be mod I with upgraded HEP - 01/08/2016   Status Achieved   OT SHORT TERM GOAL  #12   TITLE Pt will be able to pick up 8 pound object x 4 trials bilaterally to place on overhead shelf requiring at least 120* of shoulder flexion (for household mgmt tasks, grocery shopping ,etc)   Status Achieved   OT SHORT TERM GOAL  #13   TITLE Pt will be able to lift lightweight object unilaterally with RUE to 120* of shoulder flexion (baseline 115*)   Status Achieved  able to lift 4 pound object to this height with very minimal compensations   OT SHORT TERM GOAL  #14   TITLE Pt will be able to lift baby into and out of tub while in kneeling from and to baby  seat using both UE's   Status On-going           OT Long Term Goals - 01/15/16 1109    OT LONG TERM GOAL #7   Status Partially Met   OT LONG TERM GOAL  #10   TITLE Pt will be mod I with upgraded HEP - 02/05/2016   Status On-going   OT LONG TERM GOAL  #11   TITLE Pt will be able to pick up 5 pound object with RUE to place on overhead shelf x4 trials that requres at least 120* of shoulder flexion (for home mgmt tasks, care of child, volunteering)   Status On-going   OT LONG TERM GOAL  #12   TITLE Pt will be able to carry at least a 15 pound object using both hands without drops for 20 feet.   Status On-going   OT LONG TERM GOAL  #13   TITLE Pt will be able to get off toilet without using UE's (to allow her to use public restrooms, boat bathroom that are without grab bars)   Status Achieved               Plan - 01/15/16 1110    Clinical Impression  Statement Pt continues to make progress toward LTG's.  Pt with improving AROM for overhead reach unilaterally with RUE but strength overhead remains impaired.    Pt will benefit from skilled therapeutic intervention in order to improve on the following deficits (Retired) Abnormal gait;Decreased coordination;Decreased range of motion;Difficulty walking;Impaired flexibility;Decreased endurance;Decreased safety awareness;Increased edema;Impaired tone;Decreased knowledge of precautions;Decreased activity tolerance;Decreased balance;Decreased knowledge of use of DME;Pain;Impaired UE functional use;Impaired vision/preception;Decreased cognition;Decreased mobility;Decreased strength   Rehab Potential Good   Clinical Impairments Affecting Rehab Potential cognition, impaired sensation and anxiety   OT Frequency 2x / week   OT Duration 8 weeks   OT Treatment/Interventions Self-care/ADL training;Therapeutic exercise;Patient/family education;Balance training;Ultrasound;Neuromuscular education;Manual Therapy;Splinting;Therapeutic exercises;Energy conservation;Parrafin;DME and/or AE instruction;Therapeutic activities;Cognitive remediation/compensation;Gait Training;Fluidtherapy;Electrical Stimulation;Moist Heat;Contrast Bath;Passive range of motion;Visual/perceptual remediation/compensation   Plan NMR for bilateral and unilateral overhead reach with strenght, trunk control, transitional movements, alignment for functional ambulation, carrying and walking.   Consulted and Agree with Plan of Care Patient        Problem List Patient Active Problem List   Diagnosis Date Noted  . Spastic hemiplegia and hemiparesis affecting dominant side ([place]) 09/13/2015  . Adhesive capsulitis of right shoulder 08/24/2015  . Symptomatic partial epilepsy with simple partial seizures ([place]) 08/21/2015  . Nontraumatic cortical hemorrhage of cerebral hemisphere ([place])   . Seizure ([place])   . Seizures ([place])   . Adjustment disorder with  depressed mood   . Contracture of muscle ankle and foot 06/11/2015  . Hemiplga fol ntrm intcrbl hemor aff right dominant side 06/06/2015  . Left-sided intracerebral hemorrhage ([place]) 06/04/2015  . Seizure disorder as sequela of cerebrovascular accident ([place])   . Seizure disorder ([place]) 06/03/2015  . Sepsis ([place]) 06/03/2015  . UTI (urinary tract infection) 06/03/2015  . Hypotension 06/02/2015  . Right spastic hemiparesis ([place]) 05/28/2015  . 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 History of anxiety disorder 05/28/2015  . ICH (intracerebral hemorrhage) ([place])   . Seizure disorder, nonconvulsive, with status epilepticus ([place])   . Cerebral venous thrombosis of cortical vein   . Cytotoxic cerebral edema ([place])   . IVH (intraventricular hemorrhage) ([place])   . Term pregnancy 05/07/2015  . Spontaneous vaginal delivery 05/07/2015    Quay Burow, OTR/L 01/15/2016, 11:14 AM  [facility]  [facility] [address] [place], [place], [address] Phone: [phone]   Fax:  [phone]  Name: Ashley [name] MRN: [id number] Date of Birth: June 13, 1976

## 2016-01-17 ENCOUNTER — Ambulatory Visit: Payer: BLUE CROSS/BLUE SHIELD

## 2016-01-17 ENCOUNTER — Encounter: Payer: Self-pay | Admitting: Occupational Therapy

## 2016-01-17 ENCOUNTER — Ambulatory Visit: Payer: BLUE CROSS/BLUE SHIELD | Admitting: Occupational Therapy

## 2016-01-17 ENCOUNTER — Ambulatory Visit (HOSPITAL_BASED_OUTPATIENT_CLINIC_OR_DEPARTMENT_OTHER): Payer: BLUE CROSS/BLUE SHIELD | Admitting: Physical Medicine & Rehabilitation

## 2016-01-17 ENCOUNTER — Encounter: Payer: Self-pay | Admitting: Physical Medicine & Rehabilitation

## 2016-01-17 ENCOUNTER — Encounter: Payer: BLUE CROSS/BLUE SHIELD | Attending: Physical Medicine & Rehabilitation

## 2016-01-17 VITALS — HR 62

## 2016-01-17 DIAGNOSIS — R41844 Frontal lobe and executive function deficit: Secondary | ICD-10-CM

## 2016-01-17 DIAGNOSIS — Z79899 Other long term (current) drug therapy: Secondary | ICD-10-CM | POA: Diagnosis not present

## 2016-01-17 DIAGNOSIS — F419 Anxiety disorder, unspecified: Secondary | ICD-10-CM | POA: Diagnosis not present

## 2016-01-17 DIAGNOSIS — R2689 Other abnormalities of gait and mobility: Secondary | ICD-10-CM

## 2016-01-17 DIAGNOSIS — G8110 Spastic hemiplegia affecting unspecified side: Secondary | ICD-10-CM | POA: Diagnosis not present

## 2016-01-17 DIAGNOSIS — M25611 Stiffness of right shoulder, not elsewhere classified: Secondary | ICD-10-CM

## 2016-01-17 DIAGNOSIS — M25511 Pain in right shoulder: Secondary | ICD-10-CM

## 2016-01-17 DIAGNOSIS — G8111 Spastic hemiplegia affecting right dominant side: Secondary | ICD-10-CM

## 2016-01-17 DIAGNOSIS — I69151 Hemiplegia and hemiparesis following nontraumatic intracerebral hemorrhage affecting right dominant side: Secondary | ICD-10-CM | POA: Diagnosis not present

## 2016-01-17 DIAGNOSIS — R293 Abnormal posture: Secondary | ICD-10-CM

## 2016-01-17 DIAGNOSIS — I69120 Aphasia following nontraumatic intracerebral hemorrhage: Secondary | ICD-10-CM | POA: Insufficient documentation

## 2016-01-17 DIAGNOSIS — R208 Other disturbances of skin sensation: Secondary | ICD-10-CM

## 2016-01-17 NOTE — Therapy (Signed)
[facility] [address] [place], [place], [address] Phone: [phone]   Fax:  [phone]  Occupational Therapy Treatment  Patient Details  Name: Ashley [name] MRN: [id number] Date of Birth: Nov 29, 1975 No Data Recorded  Encounter Date: 01/17/2016      OT End of Session - 01/17/16 1116    Visit Number 27   Number of Visits 33   Date for OT Re-Evaluation 02/05/16   Authorization Type BCBS   Authorization Time Period 30 visit COMBINED between PT/OT;  pt has utilized benefit and is now self pay.    Authorization - Visit Number 27   Authorization - Number of Visits 66   OT Start Time 1015   OT Stop Time 1059   OT Time Calculation (min) 44 min   Activity Tolerance Patient tolerated treatment well      Past Medical History  Diagnosis Date  . Anxiety   . ICH (intracerebral hemorrhage) ([place])   . Stroke ([place])   . Seizures [facility])     Past Surgical History  Procedure Laterality Date  . Dilation and curettage of uterus      There were no vitals filed for this visit.  Visit Diagnosis:  Spastic hemiplegia affecting right dominant side (HCC)  Stiffness of joint, shoulder region, right  Poor trunk control  Pain in joint of right shoulder      Subjective Assessment - 01/17/16 1018    Subjective  I am nervous but also excited about the cruise next week.   Pertinent History see epic snapshot; pt with SAH 2 weeks after deliverying her 3rd child   Patient Stated Goals to be able to use my R arm normally   Currently in Pain? No/denies                      OT Treatments/Exercises (OP) - 01/17/16 0001    Neurological Re-education Exercises   Other Exercises 1 Neuro re ed in supine and sidelying to address scapular stability and stable mobility with overhead reach using resistance. Progressed to sitting and addressed overhead reach with functional reaching tasks using 2 pound object and repetition with  focus on avoiding shoulder hiking and building strength and stabiity for overhead reach in straight plane shoulder flexion as well as as abduction with neutral to slight ER.               Balance Exercises - 01/17/16 0942    Balance Exercises: Standing   Rockerboard Anterior/posterior;Lateral;EO;EC;Head turns;10 seconds;30 seconds;10 reps  no UE support             OT Short Term Goals - 01/17/16 1113    OT SHORT TERM GOAL  #11   TITLE Pt will be mod I with upgraded HEP - 01/08/2016   Status Achieved   OT SHORT TERM GOAL  #12   TITLE Pt will be able to pick up 8 pound object x 4 trials bilaterally to place on overhead shelf requiring at least 120* of shoulder flexion (for household mgmt tasks, grocery shopping ,etc)   Status Achieved   OT SHORT TERM GOAL  #13   TITLE Pt will be able to lift lightweight object unilaterally with RUE to 120* of shoulder flexion (baseline 115*)   Status Achieved  able to lift 4 pound object to this height with very minimal compensations   OT SHORT TERM GOAL  #14   TITLE Pt will be able to lift baby into and  out of tub while in kneeling from and to baby seat using both UE's   Status Partially Met  Pt sat on gma's lap and pt able to lift baby from gma's lap into tub and then lift baby out of tub and hand to [place] - 01/17/16 1114    OT LONG TERM GOAL #7   Status Partially Met   OT LONG TERM GOAL  #10   TITLE Pt will be mod I with upgraded HEP - 02/05/2016   Status On-going   OT LONG TERM GOAL  #11   TITLE Pt will be able to pick up 5 pound object with RUE to place on overhead shelf x4 trials that requres at least 120* of shoulder flexion (for home mgmt tasks, care of child, volunteering)   Status On-going   OT LONG TERM GOAL  #12   TITLE Pt will be able to carry at least a 15 pound object using both hands without drops for 20 feet.   Status On-going   OT LONG TERM GOAL  #13   TITLE Pt will be able to get off  toilet without using UE's (to allow her to use public restrooms, boat bathroom that are without grab bars)   Status Achieved               Plan - 01/17/16 1115    Clinical Impression Statement Pt continues to progress toward goals.  Pt to be on vacation next with family.     Pt will benefit from skilled therapeutic intervention in order to improve on the following deficits (Retired) Abnormal gait;Decreased coordination;Decreased range of motion;Difficulty walking;Impaired flexibility;Decreased endurance;Decreased safety awareness;Increased edema;Impaired tone;Decreased knowledge of precautions;Decreased activity tolerance;Decreased balance;Decreased knowledge of use of DME;Pain;Impaired UE functional use;Impaired vision/preception;Decreased cognition;Decreased mobility;Decreased strength   Rehab Potential Good   Clinical Impairments Affecting Rehab Potential cognition, impaired sensation and anxiety   OT Frequency 2x / week   OT Duration 8 weeks   OT Treatment/Interventions Self-care/ADL training;Therapeutic exercise;Patient/family education;Balance training;Ultrasound;Neuromuscular education;Manual Therapy;Splinting;Therapeutic exercises;Energy conservation;Parrafin;DME and/or AE instruction;Therapeutic activities;Cognitive remediation/compensation;Gait Training;Fluidtherapy;Electrical Stimulation;Moist Heat;Contrast Bath;Passive range of motion;Visual/perceptual remediation/compensation   Plan NMR for bilateral and unilateral overhead reach with strength, trunk control, transitional movements,alignment and functional ambulation without a device, carrying and walking   Consulted and Agree with Plan of Care Patient        Problem List Patient Active Problem List   Diagnosis Date Noted  . Spastic hemiplegia and hemiparesis affecting dominant side ([place]) 09/13/2015  . Adhesive capsulitis of right shoulder 08/24/2015  . Symptomatic partial epilepsy with simple partial seizures ([place])  08/21/2015  . Nontraumatic cortical hemorrhage of cerebral hemisphere ([place])   . Seizure ([place])   . Seizures ([place])   . Adjustment disorder with depressed mood   . Contracture of muscle ankle and foot 06/11/2015  . Hemiplga fol ntrm intcrbl hemor aff right dominant side 06/06/2015  . Left-sided intracerebral hemorrhage ([place]) 06/04/2015  . Seizure disorder as sequela of cerebrovascular accident ([place])   . Seizure disorder ([place]) 06/03/2015  . Sepsis ([place]) 06/03/2015  . UTI (urinary tract infection) 06/03/2015  . Hypotension 06/02/2015  . Right spastic hemiparesis ([employer]) 05/28/2015  . 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 History of anxiety disorder 05/28/2015  . ICH (intracerebral hemorrhage) ([place])   . Seizure disorder, nonconvulsive, with status epilepticus ([place])   . Cerebral venous thrombosis of cortical vein   . Cytotoxic cerebral  edema ([place])   . IVH (intraventricular hemorrhage) ([place])   . Term pregnancy 05/07/2015  . Spontaneous vaginal delivery 05/07/2015    Quay Burow, OTR/L 01/17/2016, 11:18 AM  [facility] [address] [place], [place], [address] Phone: [phone]   Fax:  [phone]  Name: Haruko Mersch MRN: [id number] Date of Birth: 1975-11-11

## 2016-01-17 NOTE — Therapy (Signed)
[facility] [address] [place], [place], [address] Phone: [phone]   Fax:  [phone]  Physical Therapy Treatment  Patient Details  Name: Ashley [name] MRN: [id number] Date of Birth: 1976/02/21 No Data Recorded  Encounter Date: 01/17/2016      PT End of Session - 01/17/16 1217    Visit Number 53   Number of Visits 57   Date for PT Re-Evaluation 02/11/16   Authorization Type BCBS. Per Laverda Page, visits now 30 for PT and OT total (15 per OT and 15 per PT). Pt wishes to continue at self pay after visit limit met.    Authorization - Visit Number 26   Authorization - Number of Visits 16   PT Start Time 4166   PT Stop Time 1014   PT Time Calculation (min) 39 min   Equipment Utilized During Treatment Gait belt   Activity Tolerance Patient tolerated treatment well   Behavior During Therapy WFL for tasks assessed/performed      Past Medical History  Diagnosis Date  . Anxiety   . ICH (intracerebral hemorrhage) ([place])   . Stroke ([place])   . Se[facility]ures [facility]     Past Surgical History  Procedure Laterality Date  . Dilation and curettage of uterus      There were no vitals filed for this visit.  Visit Diagnosis:  Other abnormalities of gait and mobility      Subjective Assessment - 01/17/16 0937    Subjective Pt denied falls since last visit. Pt reported she hasn't used [place] much, as it hurt at first and felt rushed to try to prior to the cruise. She plans to trial it again after she gets from the cruise.    Pertinent History Seizures, low BP   Patient Stated Goals "Be as close to normal as much as possible and to go back to running" Get back to taking care of my kids, 60 weeks old and 47 and 48 years old   Currently in Pain? No/denies                         [facility] Adult PT Treatment/Exercise - 01/17/16 0941    Ambulation/Gait   Ambulation/Gait Yes   Ambulation/Gait Assistance 5: Supervision    Ambulation/Gait Assistance Details Cues to improve RLE stance time and decr. R elbow flexion.    Ambulation Distance (Feet) 75 Feet  20'x3, 50'   Assistive device None   Gait Pattern Decreased stance time - right;Decreased weight shift to right;Right foot flat;Decreased hip/knee flexion - right;Step-through pattern;Decreased step length - left;Trunk rotated posteriorly on right   Ambulation Surface Level;Unlevel             Balance Exercises - 01/17/16 0942    Balance Exercises: Standing   Rockerboard Anterior/posterior;Lateral;EO;EC;Head turns;10 seconds;30 seconds;10 reps;Intermittent UE support  and no UE support   Other Standing Exercises In // bars with pt progressing from 2 UE support, to one UE support to no UE support: cues to improve wt. shifting onto RLE. Pt performed all reps with and without R AFO donned. Cues for technique and pt required two standing rest breaks 2/2 fatigue. Min guard to min A for safety.             PT Short Term Goals - 01/08/16 1119    PT SHORT TERM GOAL #1   Title Pt will be IND in HEP to improve strength, balance, and endurance. Target date:  01/10/16.   Status On-going   PT SHORT TERM GOAL #2   Title Pt will improve gait speed to >/=1.38f/sec to decrease falls risk. Target date: 07/27/15.   Status Achieved   PT SHORT TERM GOAL #3   Title Pt will ambulate 300' with LRAD, over even terrain, at MOD I level to improve functional mobility. Target date: 07/27/15.   Status Achieved   PT SHORT TERM GOAL #4   Title Pt will perform TUG in </=13.5 seconds with LRAD to decr. falls risk. Target date: 01/10/15.   Status Achieved   PT SHORT TERM GOAL #5   Title Perform BERG and write STG and LTG as appropriate. Target date: 07/27/15.   Status Achieved   PT SHORT TERM GOAL #6   Title Pt will improve BERG to >/=51/56 to decr. falls risk. Target date: 07/27/15.   Status Achieved   PT SHORT TERM GOAL #7   Title Pt will amb. 500' over even/uneven  terrain with LRAD at MOD I level to improve functional mobility. Target date: 01/10/16   Status Partially Met   PT SHORT TERM GOAL #8   Title Pt will ascend/descend curb with LRAD at MOD I level to improve functional mobility. Target date: 01/10/16   Status Partially Met   PT SHORT TERM GOAL #9   TITLE Pt will ascend/descend 12 steps with one handrail at MOD I level to improve functional mobility. Target date: 01/10/16   Status Partially Met   PT SHORT TERM GOAL #10   TITLE Pt will incr. DGI score to >/=17/24 to decr. falls risk. Target date: 01/10/16   Baseline Revised on 12/20/15, as pt scored 14/24 on DGI.   Status Achieved           PT Long Term Goals - 12/20/15 1138    PT LONG TERM GOAL #1   Title Pt will ambulate 1000' over even/uneven terrain, with LRAD, at MOD I level to improve functional mobiliity. Target date: 02/07/16/17.   Baseline All unmet goals will be carried over to new 8 week POC: 02/07/16   Status On-going   PT LONG TERM GOAL #2   Title Pt will amb. 300' over even terrain without an AD, IND, to safely amb. in home. Target date: 02/07/16   Status On-going   PT LONG TERM GOAL #3   Title Pt will improve gait speed to >/=2.662fsec, with LRAD, to amb. safely in the community. Target date: 11/12/14.   Status Achieved   PT LONG TERM GOAL #4   Title Pt will verbalize plans to join a fitness center upon d/c from PT to continue to maintain strength and endurance gains made during PT. Target date: 02/07/16.   Status On-going   PT LONG TERM GOAL #5   Title Pt improve BERG score to >/=55/56 to decr. falls risk. Target date: 12/11/15   Status Achieved   PT LONG TERM GOAL #6   Title Pt will ascend/descend curb with LRAD at MOD I level to improve functional mobility. Target date: 02/07/16   Status On-going   PT LONG TERM GOAL #7   Title Pt will ascend/descend 8 steps with one handrail at MOD I level to improve functional mobility. Target date: 02/07/16   Status On-going   PT LONG TERM  GOAL #8   Title Pt will ascend/descend ladder at MOD I level in order to transfer in/out of water. Target date: 02/07/16   Status On-going   PT LONG TERM GOAL  #9  TITLE Pt will be able to transfer on/off boat at MOD I level, and no LOB, in order improve functional mobility and participate in family activities. Target date: 02/07/16   Status On-going   PT LONG TERM GOAL  #10   TITLE Pt will be IIND in progressed HEP in order to improve balance, strength and flexibility. Target date: 02/07/16   Status On-going   PT LONG TERM GOAL  #11   TITLE Pt will improve DGI score to >/=20/24 to decr. falls risk. Target date: 02/07/16   Status New               Plan - 01/17/16 1218    Clinical Impression Statement Pt demonstrated progress as she was able to perform high level balance activities with min guard to min A. Pt continues to require cues to imporve RLE stance time during gait and balance activities. Continue with POC.    Pt will benefit from skilled therapeutic intervention in order to improve on the following deficits Abnormal gait;Decreased endurance;Impaired sensation;Decreased knowledge of precautions;Decreased activity tolerance;Decreased knowledge of use of DME;Decreased strength;Impaired UE functional use;Impaired tone;Decreased balance;Decreased mobility;Decreased cognition;Decreased range of motion;Decreased safety awareness;Decreased coordination;Impaired flexibility;Postural dysfunction   Rehab Potential Good   Clinical Impairments Affecting Rehab Potential Seizures which may limit intensity of PT   PT Frequency 2x / week   PT Duration 8 weeks   PT Treatment/Interventions ADLs/Self Care Home Management;Neuromuscular re-education;Cognitive remediation;Biofeedback;DME Instruction;Gait training;Stair training;Canalith Repostioning;Patient/family education;Orthotic Fit/Training;Balance training;Therapeutic exercise;Manual techniques;Therapeutic activities;Vestibular   PT Next Visit Plan  Perform hamstring curls on stool with active B DF, gait without RW   PT Home Exercise Plan Stretching/strength/balance HEP   Consulted and Agree with Plan of Care Patient        Problem List Patient Active Problem List   Diagnosis Date Noted  . Spastic hemiplegia and hemiparesis affecting dominant side ([place]) 09/13/2015  . Adhesive capsulitis of right shoulder 08/24/2015  . Symptomatic partial epilepsy with simple partial seizures ([place]) 08/21/2015  . Nontraumatic cortical hemorrhage of cerebral hemisphere ([place])   . Seizure ([place])   . Seizures ([place])   . Adjustment disorder with depressed mood   . Contracture of muscle ankle and foot 06/11/2015  . Hemiplga fol ntrm intcrbl hemor aff right dominant side 06/06/2015  . Left-sided intracerebral hemorrhage ([place]) 06/04/2015  . Seizure disorder as sequela of cerebrovascular accident ([place])   . Seizure disorder ([place]) 06/03/2015  . Sepsis ([place]) 06/03/2015  . UTI (urinary tract infection) 06/03/2015  . Hypotension 06/02/2015  . Right spastic hemiparesis ([place]) 05/28/2015  . 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 History of anxiety disorder 05/28/2015  . ICH (intracerebral hemorrhage) ([place])   . Seizure disorder, nonconvulsive, with status epilepticus ([place])   . Cerebral venous thrombosis of cortical vein   . Cytotoxic cerebral edema ([place])   . IVH (intraventricular hemorrhage) ([place])   . Term pregnancy 05/07/2015  . Spontaneous vaginal delivery 05/07/2015    Tobi Leinweber L 01/17/2016, 12:20 PM  [facility] [address] [place], [place], [address] Phone: [phone]   Fax:  [phone]  Name: Ashley [name] MRN: [id number] Date of Birth: [birth year]     Geoffry Paradise, PT,DPT 01/17/2016 12:20 PM Phone: [phone] Fax: [phone]

## 2016-01-17 NOTE — Progress Notes (Signed)
Subjective:    Patient ID: Ashley [name], female    DOB: 1976/08/14, 40 y.o.   MRN: [id number]  HPI 40 year old female with a left Postpartum intracranial hemorrhage causing right hemiparesis and aphasia onset in August 2016. She is currently undergoing outpatient rehabilitation at neuro rehabilitation. Both PT and OT, no longer requiring speech therapy. Underwent Botox injection approximately 6 weeks ago.  The muscles injected included the following Gastrosoleus 50 units medial and 50 units lateral Soleus 25 units 3 Posterior tibialis 25 units 3 Flexor digitorum longus 25 units 2 Patient notes minimal if any improvement in her toe flexor spasticity as well as her foot inversion spasticity.  She is currently trying functional electrical nerve stimulation to promote ankle dorsiflexion and reduce dependence on right AFO The patient doesn't like wearing the AFO when she wear shorts. She also likes to go barefoot in the house Pain Inventory Average Pain 0 Pain Right Now 0 My pain is no pain  In the last 24 hours, has pain interfered with the following? General activity 0 Relation with others 0 Enjoyment of life 0 What TIME of day is your pain at its worst? no pain Sleep (in general) Fair  Pain is worse with: some activites Pain improves with: therapy/exercise Relief from Meds: 0  Mobility use a cane use a walker how many minutes can you walk? 30 ability to climb steps?  yes do you drive?  no  Function not employed: date last employed 05/2015 I need assistance with the following:  bathing and shopping  Neuro/Psych trouble walking confusion depression anxiety suicidal thoughts  No active plan, sees counselor  Prior Studies Any changes since last visit?  no  Physicians involved in your care Any changes since last visit?  no   Family History  Problem Relation Age of Onset  . Cancer Mother   . Cancer Father     pancreatic   Social History   Social History   . Marital Status: Married    Spouse Name: N/A  . Number of Children: N/A  . Years of Education: N/A   Social History Main Topics  . Smoking status: Never Smoker   . Smokeless tobacco: Never Used  . Alcohol Use: 0.6 oz/week    1 Glasses of wine per week     Comment: casual   . Drug Use: No  . Sexual Activity: Not Asked   Other Topics Concern  . None   Social History Narrative   Past Surgical History  Procedure Laterality Date  . Dilation and curettage of uterus     Past Medical History  Diagnosis Date  . Anxiety   . ICH (intracerebral hemorrhage) ([place])   . Stroke ([place])   . Seizures ([facility])    Opioid Risk Score:   Fall Risk Score:  `1  Depression screen PHQ 2/9  Depression screen PHQ 2/9 07/16/2015  Decreased Interest 1  Down, Depressed, Hopeless 1  PHQ - 2 Score 2  Altered sleeping 0  Tired, decreased energy 1  Change in appetite 1  Feeling bad or failure about yourself  1  Trouble concentrating 1  Moving slowly or fidgety/restless 1  Suicidal thoughts 1  PHQ-9 Score 8  Difficult doing work/chores Somewhat difficult     Review of Systems  Constitutional: Positive for appetite change.  Psychiatric/Behavioral: Positive for suicidal ideas.       No active plan---sees Dr Valentina Shaggy  All other systems reviewed and are negative.      Objective:  Physical Exam  Constitutional: She is oriented to person, place, and time. She appears well-developed and well-nourished.  HENT:  Head: Normocephalic and atraumatic.  Eyes: Conjunctivae and EOM are normal. Pupils are equal, round, and reactive to light.  Neurological: She is alert and oriented to person, place, and time. She displays atrophy. She exhibits abnormal muscle tone. Coordination and gait abnormal.  Mild right foot intrinsic atrophy  Foot inversion during swing and stance phase, toe flexion during stance phase  Psychiatric: She has a normal mood and affect.  Nursing note and vitals reviewed.   Motor  strength is 3 minus in the right TA, 0 at the right peroneus longus, 2 minus in the flexor digitorum longus. Patient has no evidence of clonus at the right ankle. With standing she has excessive toe flexion as well as mild foot inversion.      Assessment & Plan:  1. Right spastic hemiparesis secondary to left postpartum ICH. She had minimal relief of her right lower extremity foot inversion and toe flexion spasticity following Botox injection of 300 units Recommend increasing to 400 units and use ultrasound to assist with guidance.  Gastrosoleus 50 units medial and 50 units lateral Soleus 25 units 3 Posterior tibialis 50 units 3 Flexor digitorum longus 25 units 3  Discussed with patient and her husband agree with plan

## 2016-01-17 NOTE — Patient Instructions (Signed)
Botox at higher dose next visit

## 2016-01-18 ENCOUNTER — Ambulatory Visit (INDEPENDENT_AMBULATORY_CARE_PROVIDER_SITE_OTHER): Payer: BLUE CROSS/BLUE SHIELD | Admitting: Psychology

## 2016-01-18 ENCOUNTER — Encounter: Payer: Self-pay | Admitting: Psychology

## 2016-01-18 DIAGNOSIS — F321 Major depressive disorder, single episode, moderate: Secondary | ICD-10-CM

## 2016-01-18 NOTE — Progress Notes (Signed)
[facility]  [address]   Telephone [phone] [address] Fax [phone] [place], [place] [address]   Psychology Progress Note   Name:  Ashley [name] Date of Birth:  July 07, 1976 MRN:  [id number]  Date:01/18/2016 (27m) psychotherapy "I'm doing better". She reported that her levels of discouragement and anxiety have much reduced over the past week. She is now looking forward to going on a a cruise with her family next week.   She reports awareness of memory difficulties post-stroke. She also reported being prone to cry more easily and to express her ideas with less of a filter since her stroke. No report of impulsive behavior, loss of social comportment or mania. She denied suicidal ideation. She reports higher energy level subsequent to starting on Trintellix. She reports lowered appetite though she is trying to eat more.   Affect appeared mostly bright and congruent.    Diagnostic Impression: Major depressive disorder, single episode, moderate (Post-stroke depression) [F32.1]  To try to help prepare her psychologically for the cruise, discussed issues regarding her self-consciousness about her gait, comments or questions she might receive from curious strangers, the effects of fatigue on her physical and emotional functioning, and the perceived expectations of her family.     Return on 01/31/16 for individual session.  Will probably invite her husband to join session after that one.    Jamey Ripa, Ph.D Licensed Psychologist

## 2016-01-21 NOTE — Addendum Note (Signed)
Addended by: Elza Rafter on: 01/21/2016 09:52 AM   Modules accepted: Orders

## 2016-01-21 NOTE — Addendum Note (Signed)
Addended by: Rivka Barbara on: 01/21/2016 09:18 AM   Modules accepted: Orders

## 2016-01-29 ENCOUNTER — Ambulatory Visit: Payer: BLUE CROSS/BLUE SHIELD | Admitting: Occupational Therapy

## 2016-01-29 ENCOUNTER — Ambulatory Visit: Payer: BLUE CROSS/BLUE SHIELD

## 2016-01-29 DIAGNOSIS — G8111 Spastic hemiplegia affecting right dominant side: Secondary | ICD-10-CM

## 2016-01-29 DIAGNOSIS — M25611 Stiffness of right shoulder, not elsewhere classified: Secondary | ICD-10-CM

## 2016-01-29 DIAGNOSIS — R2689 Other abnormalities of gait and mobility: Secondary | ICD-10-CM

## 2016-01-29 NOTE — Therapy (Signed)
[facility] [address] [place], [place], [address] Phone: [phone]   Fax:  [phone]  Occupational Therapy Treatment  Patient Details  Name: Ashley [name] MRN: [id number] Date of Birth: 23-Mar-1976 No Data Recorded  Encounter Date: 01/29/2016    Past Medical History  Diagnosis Date  . Anxiety   . ICH (intracerebral hemorrhage) ([place])   . Stroke ([place])   . Seizures [facility])     Past Surgical History  Procedure Laterality Date  . Dilation and curettage of uterus      There were no vitals filed for this visit.                              OT Short Term Goals - 01/29/16 1249    OT SHORT TERM GOAL  #11   TITLE Pt will be mod I with upgraded HEP - 01/08/2016   Status Achieved   OT SHORT TERM GOAL  #12   TITLE Pt will be able to pick up 8 pound object x 4 trials bilaterally to place on overhead shelf requiring at least 120* of shoulder flexion (for household mgmt tasks, grocery shopping ,etc)   Status Achieved   OT SHORT TERM GOAL  #13   TITLE Pt will be able to lift lightweight object unilaterally with RUE to 120* of shoulder flexion (baseline 115*)   Status Achieved  able to lift 4 pound object to this height with very minimal compensations   OT SHORT TERM GOAL  #14   TITLE Pt will be able to lift baby into and out of tub while in kneeling from and to baby seat using both UE's   Status Partially Met  Pt sat on gma's lap and pt able to lift baby from gma's lap into tub and then lift baby out of tub and hand to gma            OT Long Term Goals - 01/29/16 1249    OT LONG TERM GOAL #7   Status Partially Met   OT LONG TERM GOAL  #10   TITLE Pt will be mod I with upgraded HEP - 02/12/2016 (date adjusted as pt did not attend therapy last week)   Status On-going   OT LONG TERM GOAL  #11   TITLE Pt will be able to pick up 5 pound object with RUE to place on overhead shelf x4 trials that  requres at least 120* of shoulder flexion (for home mgmt tasks, care of child, volunteering)   Status On-going   OT LONG TERM GOAL  #12   TITLE Pt will be able to carry at least a 15 pound object using both hands without drops for 20 feet.   Status On-going   OT LONG TERM GOAL  #13   TITLE Pt will be able to get off toilet without using UE's (to allow her to use public restrooms, boat bathroom that are without grab bars)   Status Achieved             Patient will benefit from skilled therapeutic intervention in order to improve the following deficits and impairments:     Visit Diagnosis: Stiffness of right shoulder, not elsewhere classified    Problem List Patient Active Problem List   Diagnosis Date Noted  . Spastic hemiplegia and hemiparesis affecting dominant side ([place]) 09/13/2015  . Adhesive capsulitis of right shoulder 08/24/2015  . Symptomatic partial epilepsy  with simple partial seizures ([place]) 08/21/2015  . Nontraumatic cortical hemorrhage of cerebral hemisphere ([place])   . Seizure ([place])   . Seizures ([place])   . Adjustment disorder with depressed mood   . Contracture of muscle ankle and foot 06/11/2015  . Hemiplga fol ntrm intcrbl hemor aff right dominant side 06/06/2015  . Left-sided intracerebral hemorrhage ([place]) 06/04/2015  . Seizure disorder as sequela of cerebrovascular accident ([place])   . Seizure disorder ([place]) 06/03/2015  . Sepsis ([place]) 06/03/2015  . UTI (urinary tract infection) 06/03/2015  . Hypotension 06/02/2015  . Right spastic hemiparesis ([place]) 05/28/2015  . 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 History of anxiety disorder 05/28/2015  . ICH (intracerebral hemorrhage) ([place])   . Seizure disorder, nonconvulsive, with status epilepticus ([place])   . Cerebral venous thrombosis of cortical vein   . Cytotoxic cerebral edema ([place])   . IVH (intraventricular hemorrhage) ([place])   . Term pregnancy 05/07/2015  . Spontaneous vaginal  delivery 05/07/2015    Quay Burow, OTR/L 01/29/2016, 12:51 PM  [facility] [address] [place], [place], [address] Phone: [phone]   Fax:  [phone]  Name: Danae Oland MRN: [id number] Date of Birth: 03-30-1976

## 2016-01-29 NOTE — Therapy (Signed)
[facility] [address] [place], [place], [address] Phone: [phone]   Fax:  [phone]  Physical Therapy Treatment  Patient Details  Name: Ashley [name] MRN: [id number] Date of Birth: 1975-10-17 No Data Recorded  Encounter Date: 01/29/2016      PT End of Session - 01/29/16 1110    Visit Number 53   Number of Visits 44   Date for PT Re-Evaluation 02/11/16   Authorization Type [employer]. Per Laverda Page, visits now 30 for PT and OT total (15 per OT and 15 per PT). Pt wishes to continue at self pay after visit limit met.    Authorization - Visit Number 27   Authorization - Number of Visits 16   PT Start Time 1020   PT Stop Time 1059   PT Time Calculation (min) 39 min   Equipment Utilized During Treatment Gait belt   Activity Tolerance Patient tolerated treatment well   Behavior During Therapy WFL for tasks assessed/performed      Past Medical History  Diagnosis Date  . Anxiety   . ICH (intracerebral hemorrhage) ([place])   . Stroke ([place])   . Seizures [facility])     Past Surgical History  Procedure Laterality Date  . Dilation and curettage of uterus      There were no vitals filed for this visit.      Subjective Assessment - 01/29/16 1022    Subjective Pt reported she went on a cruise and walked long distances with brace. Pt did fall, while amb., she tripped over ladder in cruiseship room. Pt hit her head but denied HA, nausea, confusion, vision changes.  pt reported L occipital area is still painful to palpation.   Pertinent History Seizures, low BP   Patient Stated Goals "Be as close to normal as much as possible and to go back to running" Get back to taking care of my kids, 59 weeks old and 63 and 44 years old   Currently in Pain? No/denies                         [facility] Adult PT Treatment/Exercise - 01/29/16 1024    Ambulation/Gait   Ambulation/Gait Yes   Ambulation/Gait Assistance 5: Supervision    Ambulation/Gait Assistance Details Pt amb. indoors and over paved surfaces with R AFO donned. Cues to improve wt. shifting onto R LE and to decr. R elbow flexion. Pt amb. over indoor terrain without R AFO donned.    Ambulation Distance (Feet) 750 Feet  with AFO donned and 500' without AFO donned   Assistive device None   Gait Pattern Decreased stance time - right;Decreased weight shift to right;Right foot flat;Decreased hip/knee flexion - right;Step-through pattern;Decreased step length - left;Trunk rotated posteriorly on right   Ambulation Surface Level;Unlevel;Indoor;Outdoor;Paved   Knee/Hip Exercises: Seated   Hamstring Curl Strengthening;Both;Other (comment)  115'   Hamstring Limitations While seated on stool, pt performed hamstring contraction and B DF to propel stool 115', cues for technique and to decr. R UE flexion.                  PT Short Term Goals - 01/08/16 1119    PT SHORT TERM GOAL #1   Title Pt will be IND in HEP to improve strength, balance, and endurance. Target date: 01/10/16.   Status On-going   PT SHORT TERM GOAL #2   Title Pt will improve gait speed to >/=1.75f/sec to decrease falls risk.  Target date: 07/27/15.   Status Achieved   PT SHORT TERM GOAL #3   Title Pt will ambulate 300' with LRAD, over even terrain, at MOD I level to improve functional mobility. Target date: 07/27/15.   Status Achieved   PT SHORT TERM GOAL #4   Title Pt will perform TUG in </=13.5 seconds with LRAD to decr. falls risk. Target date: 01/10/15.   Status Achieved   PT SHORT TERM GOAL #5   Title Perform BERG and write STG and LTG as appropriate. Target date: 07/27/15.   Status Achieved   PT SHORT TERM GOAL #6   Title Pt will improve BERG to >/=51/56 to decr. falls risk. Target date: 07/27/15.   Status Achieved   PT SHORT TERM GOAL #7   Title Pt will amb. 500' over even/uneven terrain with LRAD at MOD I level to improve functional mobility. Target date: 01/10/16   Status  Partially Met   PT SHORT TERM GOAL #8   Title Pt will ascend/descend curb with LRAD at MOD I level to improve functional mobility. Target date: 01/10/16   Status Partially Met   PT SHORT TERM GOAL #9   TITLE Pt will ascend/descend 12 steps with one handrail at MOD I level to improve functional mobility. Target date: 01/10/16   Status Partially Met   PT SHORT TERM GOAL #10   TITLE Pt will incr. DGI score to >/=17/24 to decr. falls risk. Target date: 01/10/16   Baseline Revised on 12/20/15, as pt scored 14/24 on DGI.   Status Achieved           PT Long Term Goals - 12/20/15 1138    PT LONG TERM GOAL #1   Title Pt will ambulate 1000' over even/uneven terrain, with LRAD, at MOD I level to improve functional mobiliity. Target date: 02/07/16/17.   Baseline All unmet goals will be carried over to new 8 week POC: 02/07/16   Status On-going   PT LONG TERM GOAL #2   Title Pt will amb. 300' over even terrain without an AD, IND, to safely amb. in home. Target date: 02/07/16   Status On-going   PT LONG TERM GOAL #3   Title Pt will improve gait speed to >/=2.51f/sec, with LRAD, to amb. safely in the community. Target date: 11/12/14.   Status Achieved   PT LONG TERM GOAL #4   Title Pt will verbalize plans to join a fitness center upon d/c from PT to continue to maintain strength and endurance gains made during PT. Target date: 02/07/16.   Status On-going   PT LONG TERM GOAL #5   Title Pt improve BERG score to >/=55/56 to decr. falls risk. Target date: 12/11/15   Status Achieved   PT LONG TERM GOAL #6   Title Pt will ascend/descend curb with LRAD at MOD I level to improve functional mobility. Target date: 02/07/16   Status On-going   PT LONG TERM GOAL #7   Title Pt will ascend/descend 8 steps with one handrail at MOD I level to improve functional mobility. Target date: 02/07/16   Status On-going   PT LONG TERM GOAL #8   Title Pt will ascend/descend ladder at MOD I level in order to transfer in/out of  water. Target date: 02/07/16   Status On-going   PT LONG TERM GOAL  #9   TITLE Pt will be able to transfer on/off boat at MOD I level, and no LOB, in order improve functional mobility and participate in family activities.  Target date: 02/07/16   Status On-going   PT LONG TERM GOAL  #10   TITLE Pt will be IIND in progressed HEP in order to improve balance, strength and flexibility. Target date: 02/07/16   Status On-going   PT LONG TERM GOAL  #11   TITLE Pt will improve DGI score to >/=20/24 to decr. falls risk. Target date: 02/07/16   Status New               Plan - 01/29/16 1110    Clinical Impression Statement Pt demonstrated progress, as she was able to amb. longer distances without AD and AFO donned and doffed. Pt demonstrated improved strength and endurance as she was able to propel stool longer distances. with B hamstring contractions. Pt continues to require cues to improve wt. shifting onto R LE during amb.    Rehab Potential Good   Clinical Impairments Affecting Rehab Potential Seizures which may limit intensity of PT   PT Frequency 2x / week   PT Duration 8 weeks   PT Treatment/Interventions ADLs/Self Care Home Management;Neuromuscular re-education;Cognitive remediation;Biofeedback;DME Instruction;Gait training;Stair training;Canalith Repostioning;Patient/family education;Orthotic Fit/Training;Balance training;Therapeutic exercise;Manual techniques;Therapeutic activities;Vestibular   PT Next Visit Plan gait without RW and balance SLS   PT Home Exercise Plan Stretching/strength/balance HEP   Consulted and Agree with Plan of Care Patient      Patient will benefit from skilled therapeutic intervention in order to improve the following deficits and impairments:  Abnormal gait, Decreased endurance, Impaired sensation, Decreased knowledge of precautions, Decreased activity tolerance, Decreased knowledge of use of DME, Decreased strength, Impaired UE functional use, Impaired tone,  Decreased balance, Decreased mobility, Decreased cognition, Decreased range of motion, Decreased safety awareness, Decreased coordination, Impaired flexibility, Postural dysfunction  Visit Diagnosis: Other abnormalities of gait and mobility  Spastic hemiplegia affecting right dominant side [facility]     Problem List Patient Active Problem List   Diagnosis Date Noted  . Spastic hemiplegia and hemiparesis affecting dominant side ([place]) 09/13/2015  . Adhesive capsulitis of right shoulder 08/24/2015  . Symptomatic partial epilepsy with simple partial seizures ([place]) 08/21/2015  . Nontraumatic cortical hemorrhage of cerebral hemisphere ([place])   . Seizure ([place])   . Seizures ([place])   . Adjustment disorder with depressed mood   . Contracture of muscle ankle and foot 06/11/2015  . Hemiplga fol ntrm intcrbl hemor aff right dominant side 06/06/2015  . Left-sided intracerebral hemorrhage ([place]) 06/04/2015  . Seizure disorder as sequela of cerebrovascular accident ([place])   . Seizure disorder ([place]) 06/03/2015  . Sepsis ([place]) 06/03/2015  . UTI (urinary tract infection) 06/03/2015  . Hypotension 06/02/2015  . Right spastic hemiparesis ([place]) 05/28/2015  . 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 History of anxiety disorder 05/28/2015  . ICH (intracerebral hemorrhage) ([place])   . Seizure disorder, nonconvulsive, with status epilepticus ([place])   . Cerebral venous thrombosis of cortical vein   . Cytotoxic cerebral edema ([place])   . IVH (intraventricular hemorrhage) ([place])   . Term pregnancy 05/07/2015  . Spontaneous vaginal delivery 05/07/2015    Atanacio Melnyk L 01/29/2016, 11:13 AM  [facility] [address] [place], [place], [address] Phone: [phone]   Fax:  [phone]  Name: Ashley [name] MRN: [id number] Date of Birth: 12/08/1975       Geoffry Paradise, PT,DPT 01/29/2016 11:13 AM Phone:  [phone] Fax: [phone]

## 2016-01-31 ENCOUNTER — Ambulatory Visit (INDEPENDENT_AMBULATORY_CARE_PROVIDER_SITE_OTHER): Payer: BLUE CROSS/BLUE SHIELD | Admitting: Psychology

## 2016-01-31 ENCOUNTER — Encounter: Payer: Self-pay | Admitting: Occupational Therapy

## 2016-01-31 ENCOUNTER — Ambulatory Visit: Payer: BLUE CROSS/BLUE SHIELD

## 2016-01-31 ENCOUNTER — Ambulatory Visit: Payer: BLUE CROSS/BLUE SHIELD | Admitting: Occupational Therapy

## 2016-01-31 DIAGNOSIS — R208 Other disturbances of skin sensation: Secondary | ICD-10-CM

## 2016-01-31 DIAGNOSIS — M25511 Pain in right shoulder: Secondary | ICD-10-CM

## 2016-01-31 DIAGNOSIS — M25611 Stiffness of right shoulder, not elsewhere classified: Secondary | ICD-10-CM

## 2016-01-31 DIAGNOSIS — G8111 Spastic hemiplegia affecting right dominant side: Secondary | ICD-10-CM

## 2016-01-31 DIAGNOSIS — R2689 Other abnormalities of gait and mobility: Secondary | ICD-10-CM

## 2016-01-31 DIAGNOSIS — R41844 Frontal lobe and executive function deficit: Secondary | ICD-10-CM

## 2016-01-31 DIAGNOSIS — F429 Obsessive-compulsive disorder, unspecified: Secondary | ICD-10-CM | POA: Diagnosis not present

## 2016-01-31 DIAGNOSIS — R293 Abnormal posture: Secondary | ICD-10-CM

## 2016-01-31 DIAGNOSIS — F4321 Adjustment disorder with depressed mood: Secondary | ICD-10-CM

## 2016-01-31 NOTE — Therapy (Signed)
[facility] [address] [place], [place], [address] Phone: [phone]   Fax:  [phone]  Occupational Therapy Treatment  Patient Details  Name: Ashley [name] MRN: [id number] Date of Birth: 1976-09-16 No Data Recorded  Encounter Date: 01/31/2016      OT End of Session - 01/31/16 1204    Visit Number 28   Number of Visits 33   Date for OT Re-Evaluation 02/05/16   Authorization Type BCBS   Authorization Time Period 30 visit COMBINED between PT/OT;  pt has utilized benefit and is now self pay.    Authorization - Visit Number 28   Authorization - Number of Visits 33   OT Start Time 1016   OT Stop Time 1102   OT Time Calculation (min) 46 min   Activity Tolerance Patient tolerated treatment well      Past Medical History  Diagnosis Date  . Anxiety   . ICH (intracerebral hemorrhage) ([place])   . Stroke ([place])   . Seizures [facility])     Past Surgical History  Procedure Laterality Date  . Dilation and curettage of uterus      There were no vitals filed for this visit.      Subjective Assessment - 01/31/16 1017    Subjective  I fell trying to go after the dog but I didn't get hurt   Pertinent History see epic snapshot; pt with [facility] 2 weeks after deliverying her 3rd child   Patient Stated Goals to be able to use my R arm normally   Currently in Pain? No/denies                      OT Treatments/Exercises (OP) - 01/31/16 0001    ADLs   ADL Comments Discussed new goals as pt wishes to renew and is still making signficant gains in therapy.  Pt able to generate several goals and will renew next visit.  Pt also wishes to return to driving at some point - has been practicing in empty parking lot with husband in the car.  Provided pt with written information regarding formal driving eval and driver training and pt to discuss with husband.    Neurological Re-education Exercises   Other Exercises 1 Neuro re ed  to address carrying 15 pound item with both hands during functional ambulation as well as increasing strength control with unilateral overhead reach with RUE in standing.               Balance Exercises - 01/31/16 0952    Balance Exercises: Standing   SLS Solid surface;5 reps;Eyes open   Other Standing Exercises Pt performed  cone taps (single, double and knock down/bring upright). Cues to improve lateral wt. shifting to RLE. Pt required 2 seated rest breaks 2/2 fatigue.              OT Short Term Goals - 01/31/16 1200    OT SHORT TERM GOAL  #11   TITLE Pt will be mod I with upgraded HEP - 01/08/2016   Status Achieved   OT SHORT TERM GOAL  #12   TITLE Pt will be able to pick up 8 pound object x 4 trials bilaterally to place on overhead shelf requiring at least 120* of shoulder flexion (for household mgmt tasks, grocery shopping ,etc)   Status Achieved   OT SHORT TERM GOAL  #13   TITLE Pt will be able to lift lightweight object unilaterally with RUE to  120* of shoulder flexion (baseline 115*)   Status Achieved  able to lift 4 pound object to this height with very minimal compensations   OT SHORT TERM GOAL  #14   TITLE Pt will be able to lift baby into and out of tub while in kneeling from and to baby seat using both UE's   Status Partially Met  Pt sat on gma's lap and pt able to lift baby from gma's lap into tub and then lift baby out of tub and hand to gma            OT Long Term Goals - 01/31/16 1200    OT LONG TERM GOAL #7   Status Partially Met   OT LONG TERM GOAL  #10   TITLE Pt will be mod I with upgraded HEP - 02/12/2016 (date adjusted as pt did not attend therapy last week)   Status Achieved   OT LONG TERM GOAL  #11   TITLE Pt will be able to pick up 5 pound object with RUE to place on overhead shelf x4 trials that requres at least 120* of shoulder flexion (for home mgmt tasks, care of child, volunteering)   Status Achieved   OT LONG TERM GOAL  #12   TITLE Pt  will be able to carry at least a 15 pound object using both hands without drops for 20 feet.   Status Achieved  145 feet carrying box with 15 pound weight   OT LONG TERM GOAL  #13   TITLE Pt will be able to get off toilet without using UE's (to allow her to use public restrooms, boat bathroom that are without grab bars)   Status Achieved               Plan - 01/31/16 1201    Clinical Impression Statement Pt continues to make excellent progress toward goals.  See goals for update - will renew pt at next visit.    Rehab Potential Good   Clinical Impairments Affecting Rehab Potential cognition, impaired sensation and anxiety   OT Frequency 2x / week   OT Duration 8 weeks   OT Treatment/Interventions Self-care/ADL training;Therapeutic exercise;Patient/family education;Balance training;Ultrasound;Neuromuscular education;Manual Therapy;Splinting;Therapeutic exercises;Energy conservation;Parrafin;DME and/or AE instruction;Therapeutic activities;Cognitive remediation/compensation;Gait Training;Fluidtherapy;Electrical Stimulation;Moist Heat;Contrast Bath;Passive range of motion;Visual/perceptual remediation/compensation   Plan complete renewal, NMR for trunk control, overhead reach with strength,balance, transitional movements,    Consulted and Agree with Plan of Care Patient      Patient will benefit from skilled therapeutic intervention in order to improve the following deficits and impairments:  Abnormal gait, Decreased coordination, Decreased range of motion, Difficulty walking, Impaired flexibility, Decreased endurance, Decreased safety awareness, Increased edema, Impaired tone, Decreased knowledge of precautions, Decreased activity tolerance, Decreased balance, Decreased knowledge of use of DME, Pain, Impaired UE functional use, Impaired vision/preception, Decreased cognition, Decreased mobility, Decreased strength  Visit Diagnosis: Stiffness of right shoulder, not elsewhere  classified  Other abnormalities of gait and mobility  Spastic hemiplegia affecting right dominant side (HCC)  Pain in right shoulder  Abnormal posture  Frontal lobe and executive function deficit  Other disturbances of skin sensation    Problem List Patient Active Problem List   Diagnosis Date Noted  . Spastic hemiplegia and hemiparesis affecting dominant side ([place]) 09/13/2015  . Adhesive capsulitis of right shoulder 08/24/2015  . Symptomatic partial epilepsy with simple partial seizures ([place]) 08/21/2015  . Nontraumatic cortical hemorrhage of cerebral hemisphere ([place])   . Seizure ([place])   . Seizures ([place])   .  Adjustment disorder with depressed mood   . Contracture of muscle ankle and foot 06/11/2015  . Hemiplga fol ntrm intcrbl hemor aff right dominant side 06/06/2015  . Left-sided intracerebral hemorrhage ([place]) 06/04/2015  . Seizure disorder as sequela of cerebrovascular accident ([place])   . Seizure disorder ([place]) 06/03/2015  . Sepsis ([place]) 06/03/2015  . UTI (urinary tract infection) 06/03/2015  . Hypotension 06/02/2015  . Right spastic hemiparesis ([place]) 05/28/2015  . 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 History of anxiety disorder 05/28/2015  . ICH (intracerebral hemorrhage) ([place])   . Seizure disorder, nonconvulsive, with status epilepticus ([place])   . Cerebral venous thrombosis of cortical vein   . Cytotoxic cerebral edema ([place])   . IVH (intraventricular hemorrhage) ([place])   . Term pregnancy 05/07/2015  . Spontaneous vaginal delivery 05/07/2015    Quay Burow, OTR/L 01/31/2016, 12:07 PM  [facility] [address] [place], [place], [address] Phone: [phone]   Fax:  [phone]  Name: Ashley [name] MRN: [id number] Date of Birth: 10/18/1975

## 2016-01-31 NOTE — Therapy (Signed)
[facility] [address] [place], [place], [address] Phone: [phone]   Fax:  [phone]  Physical Therapy Treatment  Patient Details  Name: Ashley [name] MRN: [id number] Date of Birth: [birth year] No Data Recorded  Encounter Date: 01/31/2016      PT End of Session - 01/31/16 1230    Visit Number 55   Number of Visits 43   Date for PT Re-Evaluation 02/11/16   Authorization Type BCBS. Per Laverda Page, visits now 30 for PT and OT total (15 per OT and 15 per PT). Pt wishes to continue at self pay after visit limit met.    Authorization - Visit Number 28   Authorization - Number of Visits 16   PT Start Time 0932   PT Stop Time 1013   PT Time Calculation (min) 41 min   Equipment Utilized During Treatment Gait belt   Activity Tolerance Patient tolerated treatment well   Behavior During Therapy WFL for tasks assessed/performed      Past Medical History  Diagnosis Date  . Anxiety   . ICH (intracerebral hemorrhage) ([place])   . Stroke ([place])   . Seizures [facility])     Past Surgical History  Procedure Laterality Date  . Dilation and curettage of uterus      There were no vitals filed for this visit.      Subjective Assessment - 01/31/16 0934    Subjective Pt reported she fell yesterday, while chasing dog, as dog had a baby toy. Pt reported she did not hit head and denied any other injuries. Pt amb. back to gym while holding Yeti cup in R hand, with no LOB.    Pertinent History Seizures, low BP   Patient Stated Goals "Be as close to normal as much as possible and to go back to running" Get back to taking care of my kids, 35 weeks old and 59 and 62 years old   Currently in Pain? No/denies                         [facility] Adult PT Treatment/Exercise - 01/31/16 0939    Ambulation/Gait   Ambulation/Gait Yes   Ambulation/Gait Assistance 5: Supervision   Ambulation/Gait Assistance Details Pt amb. over  even/uneven terrain with cues to decr. speed in order to improve wt. shifting onto RLE and to decr. R elbow flexion. Pt required one seated rest break. R AFO donned.   Ambulation Distance (Feet) 345 Feet  and 230' indoors and 500' outdoors   Assistive device None   Gait Pattern Decreased stance time - right;Decreased weight shift to right;Right foot flat;Decreased hip/knee flexion - right;Step-through pattern;Decreased step length - left;Trunk rotated posteriorly on right   Ambulation Surface Level;Unlevel;Indoor;Outdoor;Paved;Gravel   High Level Balance   High Level Balance Activities Negotiating over obstacles   High Level Balance Comments Pt negotiated short and tall hurdles with B LEs, with cues to improve R knee/hip flexion and to improve lateral wt. shifting. 2x5hurdles. Performed with min guard to min A (during 1 LOB episode).             Balance Exercises - 01/31/16 0952    Balance Exercises: Standing   SLS Solid surface;5 reps;Eyes open   Other Standing Exercises Pt performed  cone taps (single, double and knock down/bring upright). Cues to improve lateral wt. shifting to RLE. Pt required 2 seated rest breaks 2/2 fatigue.  PT Short Term Goals - 01/08/16 1119    PT SHORT TERM GOAL #1   Title Pt will be IND in HEP to improve strength, balance, and endurance. Target date: 01/10/16.   Status On-going   PT SHORT TERM GOAL #2   Title Pt will improve gait speed to >/=1.49f/sec to decrease falls risk. Target date: 07/27/15.   Status Achieved   PT SHORT TERM GOAL #3   Title Pt will ambulate 300' with LRAD, over even terrain, at MOD I level to improve functional mobility. Target date: 07/27/15.   Status Achieved   PT SHORT TERM GOAL #4   Title Pt will perform TUG in </=13.5 seconds with LRAD to decr. falls risk. Target date: 01/10/15.   Status Achieved   PT SHORT TERM GOAL #5   Title Perform BERG and write STG and LTG as appropriate. Target date: 07/27/15.    Status Achieved   PT SHORT TERM GOAL #6   Title Pt will improve BERG to >/=51/56 to decr. falls risk. Target date: 07/27/15.   Status Achieved   PT SHORT TERM GOAL #7   Title Pt will amb. 500' over even/uneven terrain with LRAD at MOD I level to improve functional mobility. Target date: 01/10/16   Status Partially Met   PT SHORT TERM GOAL #8   Title Pt will ascend/descend curb with LRAD at MOD I level to improve functional mobility. Target date: 01/10/16   Status Partially Met   PT SHORT TERM GOAL #9   TITLE Pt will ascend/descend 12 steps with one handrail at MOD I level to improve functional mobility. Target date: 01/10/16   Status Partially Met   PT SHORT TERM GOAL #10   TITLE Pt will incr. DGI score to >/=17/24 to decr. falls risk. Target date: 01/10/16   Baseline Revised on 12/20/15, as pt scored 14/24 on DGI.   Status Achieved           PT Long Term Goals - 12/20/15 1138    PT LONG TERM GOAL #1   Title Pt will ambulate 1000' over even/uneven terrain, with LRAD, at MOD I level to improve functional mobiliity. Target date: 02/07/16/17.   Baseline All unmet goals will be carried over to new 8 week POC: 02/07/16   Status On-going   PT LONG TERM GOAL #2   Title Pt will amb. 300' over even terrain without an AD, IND, to safely amb. in home. Target date: 02/07/16   Status On-going   PT LONG TERM GOAL #3   Title Pt will improve gait speed to >/=2.666fsec, with LRAD, to amb. safely in the community. Target date: 11/12/14.   Status Achieved   PT LONG TERM GOAL #4   Title Pt will verbalize plans to join a fitness center upon d/c from PT to continue to maintain strength and endurance gains made during PT. Target date: 02/07/16.   Status On-going   PT LONG TERM GOAL #5   Title Pt improve BERG score to >/=55/56 to decr. falls risk. Target date: 12/11/15   Status Achieved   PT LONG TERM GOAL #6   Title Pt will ascend/descend curb with LRAD at MOD I level to improve functional mobility. Target  date: 02/07/16   Status On-going   PT LONG TERM GOAL #7   Title Pt will ascend/descend 8 steps with one handrail at MOD I level to improve functional mobility. Target date: 02/07/16   Status On-going   PT LONG TERM GOAL #8   Title  Pt will ascend/descend ladder at MOD I level in order to transfer in/out of water. Target date: 02/07/16   Status On-going   PT LONG TERM GOAL  #9   TITLE Pt will be able to transfer on/off boat at MOD I level, and no LOB, in order improve functional mobility and participate in family activities. Target date: 02/07/16   Status On-going   PT LONG TERM GOAL  #10   TITLE Pt will be IIND in progressed HEP in order to improve balance, strength and flexibility. Target date: 02/07/16   Status On-going   PT LONG TERM GOAL  #11   TITLE Pt will improve DGI score to >/=20/24 to decr. falls risk. Target date: 02/07/16   Status New               Plan - 01/31/16 1231    Clinical Impression Statement Pt demonstrated progress, as she required less assist to perform SLS activities. Pt continues to require cues to improve wt. shifting onto RLE. PT and OT discussed pt's wish to resume driving and how to progress pt goals based on progress and pt's requests. Continue with POC.    Rehab Potential Good   Clinical Impairments Affecting Rehab Potential Seizures which may limit intensity of PT   PT Frequency 2x / week   PT Duration 8 weeks   PT Treatment/Interventions ADLs/Self Care Home Management;Neuromuscular re-education;Cognitive remediation;Biofeedback;DME Instruction;Gait training;Stair training;Canalith Repostioning;Patient/family education;Orthotic Fit/Training;Balance training;Therapeutic exercise;Manual techniques;Therapeutic activities;Vestibular   PT Next Visit Plan Steps while carrying objects and improve B LE reaction time   PT Home Exercise Plan Stretching/strength/balance HEP   Consulted and Agree with Plan of Care Patient      Patient will benefit from skilled  therapeutic intervention in order to improve the following deficits and impairments:  Abnormal gait, Decreased endurance, Impaired sensation, Decreased knowledge of precautions, Decreased activity tolerance, Decreased knowledge of use of DME, Decreased strength, Impaired UE functional use, Impaired tone, Decreased balance, Decreased mobility, Decreased cognition, Decreased range of motion, Decreased safety awareness, Decreased coordination, Impaired flexibility, Postural dysfunction  Visit Diagnosis: Other abnormalities of gait and mobility  Spastic hemiplegia affecting right dominant side [facility])     Problem List Patient Active Problem List   Diagnosis Date Noted  . Spastic hemiplegia and hemiparesis affecting dominant side ([place]) 09/13/2015  . Adhesive capsulitis of right shoulder 08/24/2015  . Symptomatic partial epilepsy with simple partial seizures ([place]) 08/21/2015  . Nontraumatic cortical hemorrhage of cerebral hemisphere ([place])   . Seizure ([place])   . Seizures ([place])   . Adjustment disorder with depressed mood   . Contracture of muscle ankle and foot 06/11/2015  . Hemiplga fol ntrm intcrbl hemor aff right dominant side 06/06/2015  . Left-sided intracerebral hemorrhage ([place]) 06/04/2015  . Seizure disorder as sequela of cerebrovascular accident ([place])   . Seizure disorder ([place]) 06/03/2015  . Sepsis ([place]) 06/03/2015  . UTI (urinary tract infection) 06/03/2015  . Hypotension 06/02/2015  . Right spastic hemiparesis ([place]) 05/28/2015  . 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 History of anxiety disorder 05/28/2015  . ICH (intracerebral hemorrhage) ([place])   . Seizure disorder, nonconvulsive, with status epilepticus ([place])   . Cerebral venous thrombosis of cortical vein   . Cytotoxic cerebral edema ([place])   . IVH (intraventricular hemorrhage) ([place])   . Term pregnancy 05/07/2015  . Spontaneous vaginal delivery 05/07/2015    Miller,Jennifer L 01/31/2016, 12:33  PM  [facility] [address] [place],  [place], [address] Phone: [phone]   Fax:  [phone]  Name: Ashley [name] MRN: [id number] Date of Birth: 11-23-1975    Geoffry Paradise, PT,DPT 01/31/2016 12:33 PM Phone: [phone] Fax: [phone]

## 2016-01-31 NOTE — Progress Notes (Signed)
[facility]  [address]   Telephone [phone] [address] Fax [phone] [place], [place] [address]   Psychology Progress Note   Name:  Ashley [name] Date of Birth:  June 27, 1976 MRN:  [id number]  Date:01/31/2016 (66m) psychotherapy [name] reported that on the whole her family cruise vacation went well though she did experience two emotional "meltdowns" when she had to deal with stairs, especially in crowds of people. She also felt upset about having to rely on family members to supervise her children. Otherwise her mood has been predominantly good with hopeful attitude and an absence of suicidal thoughts. She reported that she is pleased with her progress in rehabilitaitive therapies and motivated to continue.    She reported an exacerbation of prior obsessive-compulsive behavior since her stroke. She described herself as engaging in excessive checking behavior and feeling unable to deviate from a daily routine since her CVA. She reported feeling anxious when blocked from checking or following routine. She reported that she has not been able to fully focus on nor enjoy her immediate situation due to intrusive thoughts about needing to check on something, usually her tasks for the day. 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 She stated that she has always struggled with being a "perfectionist".  Diagnostic Impressions Obsessive compulsive disorder with good insight [F42.9] Adjustment disorder with depressed mood [F43.21]  Discussed that persons with cognitive impairment often resort to checking and other compulsive behaviors as a response to feeling out of control in life and as a strategy to compensate for cognitive impairment. In [name]'s case, however, her compulsive behaviors and inability to deviate from routine result in loss of spontaneity, reduced  ability to enjoy people/activities and rigid behavior. Explained that this represents a type of anxiety disorder rather than "being crazy" as she labeled it. To intervene, reviewed the use of relaxation exercises (e.g., deep breathing, positive mental imagery) upon feeling anxious urges to check something or do something in order to hopefully interrupt the chain of behavior that leads to checking early in that sequence. Also suggested that she limit herself to one check per activity per day.  Return in one week.   Jamey Ripa, Ph.D Licensed Psychologist

## 2016-02-05 ENCOUNTER — Other Ambulatory Visit: Payer: Self-pay | Admitting: Neurology

## 2016-02-05 ENCOUNTER — Ambulatory Visit: Payer: BLUE CROSS/BLUE SHIELD

## 2016-02-05 ENCOUNTER — Ambulatory Visit: Payer: BLUE CROSS/BLUE SHIELD | Admitting: Occupational Therapy

## 2016-02-05 ENCOUNTER — Encounter: Payer: Self-pay | Admitting: Occupational Therapy

## 2016-02-05 ENCOUNTER — Telehealth: Payer: Self-pay | Admitting: Neurology

## 2016-02-05 DIAGNOSIS — R208 Other disturbances of skin sensation: Secondary | ICD-10-CM

## 2016-02-05 DIAGNOSIS — R2689 Other abnormalities of gait and mobility: Secondary | ICD-10-CM

## 2016-02-05 DIAGNOSIS — G8111 Spastic hemiplegia affecting right dominant side: Secondary | ICD-10-CM | POA: Diagnosis not present

## 2016-02-05 DIAGNOSIS — R293 Abnormal posture: Secondary | ICD-10-CM

## 2016-02-05 DIAGNOSIS — G458 Other transient cerebral ischemic attacks and related syndromes: Secondary | ICD-10-CM

## 2016-02-05 DIAGNOSIS — M25611 Stiffness of right shoulder, not elsewhere classified: Secondary | ICD-10-CM

## 2016-02-05 DIAGNOSIS — R41844 Frontal lobe and executive function deficit: Secondary | ICD-10-CM

## 2016-02-05 DIAGNOSIS — E785 Hyperlipidemia, unspecified: Secondary | ICD-10-CM

## 2016-02-05 MED ORDER — ASPIRIN EC 325 MG PO TBEC: 325.0000 mg | DELAYED_RELEASE_TABLET | Freq: Every day | ORAL | Status: DC

## 2016-02-05 NOTE — Therapy (Signed)
[facility] [address] [place], [place], [address] Phone: [phone]   Fax:  [phone]  Occupational Therapy Treatment  Patient Details  Name: Ashley [name] MRN: [id number] Date of Birth: 08-18-1976 No Data Recorded  Encounter Date: 02/05/2016      OT End of Session - 02/05/16 1308    Visit Number 29   Number of Visits 33   Date for OT Re-Evaluation 02/05/16   Authorization Type BCBS   Authorization Time Period 30 visit COMBINED between PT/OT;  pt has utilized benefit and is now self pay.    Authorization - Visit Number 29   Authorization - Number of Visits 33   OT Start Time 1016   OT Stop Time 1100   OT Time Calculation (min) 44 min   Activity Tolerance Patient tolerated treatment well      Past Medical History  Diagnosis Date  . Anxiety   . ICH (intracerebral hemorrhage) ([place])   . Stroke ([place])   . Seizures [facility])     Past Surgical History  Procedure Laterality Date  . Dilation and curettage of uterus      There were no vitals filed for this visit.      Subjective Assessment - 02/05/16 1301    Subjective  I had a spell on Saturday where I couldn't get my words out   Pertinent History see epic snapshot; pt with [facility] 2 weeks after deliverying her 3rd child   Patient Stated Goals to be able to use my R arm normally   Currently in Pain? No/denies                      OT Treatments/Exercises (OP) - 02/05/16 0001    Neurological Re-education Exercises   Other Exercises 1 Pt reports today that she had brief episode (approximately 3-4  minutes) on Saturday where she could not words out and felt more confused.  [name] has contacted both PCP as well as neurologist. Pt stated she had been outside all afternoon at son's lacrosse games and could have beend dehydrated. Encouraged pt to follow through with contact with neurologis. Pt asymptomatic today.  Neuro re ed to address LUE proximal  strength and trunk control with overhead reach and active strength in LUE for overhead reach activities with elongated trunk, increased weight bearing activation in standing with LLE.                  OT Short Term Goals - 02/05/16 1306    OT SHORT TERM GOAL  #11   TITLE Pt will be mod I with upgraded HEP - 01/08/2016   Status Achieved   OT SHORT TERM GOAL  #12   TITLE Pt will be able to pick up 8 pound object x 4 trials bilaterally to place on overhead shelf requiring at least 120* of shoulder flexion (for household mgmt tasks, grocery shopping ,etc)   Status Achieved   OT SHORT TERM GOAL  #13   TITLE Pt will be able to lift lightweight object unilaterally with RUE to 120* of shoulder flexion (baseline 115*)   Status Achieved  able to lift 4 pound object to this height with very minimal compensations   OT SHORT TERM GOAL  #14   TITLE Pt will be able to lift baby into and out of tub while in kneeling from and to baby seat using both UE's   Status On-going  Pt sat on gma's lap and  pt able to lift baby from gma's lap into tub and then lift baby out of tub and hand to [place] - 02/05/16 1306    OT LONG TERM GOAL #7   Status Partially Met   OT LONG TERM GOAL  #10   TITLE Pt will be mod I with upgraded HEP - 02/12/2016 (date adjusted as pt did not attend therapy last week)   Status Achieved   OT LONG TERM GOAL  #11   TITLE Pt will be able to pick up 5 pound object with RUE to place on overhead shelf x4 trials that requres at least 120* of shoulder flexion (for home mgmt tasks, care of child, volunteering)   Status Achieved   OT LONG TERM GOAL  #12   TITLE Pt will be able to carry at least a 15 pound object using both hands without drops for 20 feet.   Status Achieved  145 feet carrying box with 15 pound weight   OT LONG TERM GOAL  #13   TITLE Pt will be able to get off toilet without using UE's (to allow her to use public restrooms, boat bathroom  that are without grab bars)   Status Achieved               Plan - 02/05/16 1307    Clinical Impression Statement Pt continues to make great progress toward goals and has increased her role resumption at home.    Rehab Potential Good   Clinical Impairments Affecting Rehab Potential cognition, impaired sensation and anxiety   OT Frequency 2x / week   OT Duration 8 weeks   OT Treatment/Interventions Self-care/ADL training;Therapeutic exercise;Patient/family education;Balance training;Ultrasound;Neuromuscular education;Manual Therapy;Splinting;Therapeutic exercises;Energy conservation;Parrafin;DME and/or AE instruction;Therapeutic activities;Cognitive remediation/compensation;Gait Training;Fluidtherapy;Electrical Stimulation;Moist Heat;Contrast Bath;Passive range of motion;Visual/perceptual remediation/compensation   Plan NMR for trunk control, overhead reach with strength, balance and transitional movements   Consulted and Agree with Plan of Care Patient      Patient will benefit from skilled therapeutic intervention in order to improve the following deficits and impairments:  Abnormal gait, Decreased coordination, Decreased range of motion, Difficulty walking, Impaired flexibility, Decreased endurance, Decreased safety awareness, Increased edema, Impaired tone, Decreased knowledge of precautions, Decreased activity tolerance, Decreased balance, Decreased knowledge of use of DME, Pain, Impaired UE functional use, Impaired vision/preception, Decreased cognition, Decreased mobility, Decreased strength  Visit Diagnosis: Spastic hemiplegia affecting right dominant side (HCC)  Other abnormalities of gait and mobility  Stiffness of right shoulder, not elsewhere classified  Abnormal posture  Frontal lobe and executive function deficit  Other disturbances of skin sensation    Problem List Patient Active Problem List   Diagnosis Date Noted  . Spastic hemiplegia and hemiparesis  affecting dominant side ([place]) 09/13/2015  . Adhesive capsulitis of right shoulder 08/24/2015  . Symptomatic partial epilepsy with simple partial seizures ([place]) 08/21/2015  . Nontraumatic cortical hemorrhage of cerebral hemisphere ([place])   . Seizure ([place])   . Seizures ([place])   . Adjustment disorder with depressed mood   . Contracture of muscle ankle and foot 06/11/2015  . Hemiplga fol ntrm intcrbl hemor aff right dominant side 06/06/2015  . Left-sided intracerebral hemorrhage ([place]) 06/04/2015  . Seizure disorder as sequela of cerebrovascular accident ([place])   . Seizure disorder ([place]) 06/03/2015  . Sepsis ([place]) 06/03/2015  . UTI (urinary tract infection) 06/03/2015  . Hypotension 06/02/2015  . Right spastic hemiparesis ([place]) 05/28/2015  . Aphasia  following nontraumatic intracerebral hemorrhage 05/28/2015  . History of anxiety disorder 05/28/2015  . ICH (intracerebral hemorrhage) ([place])   . Seizure disorder, nonconvulsive, with status epilepticus ([place] [place])   . Cerebral venous thrombosis of cortical vein   . Cytotoxic cerebral edema ([place])   . IVH (intraventricular hemorrhage) ([place])   . Term pregnancy 05/07/2015  . Spontaneous vaginal delivery 05/07/2015    Quay Burow, OTR/L 02/05/2016, 1:10 PM  [facility] [address] [place], [place], [address] Phone: [phone]   Fax:  [phone]  Name: Ashley [name] MRN: [id number] Date of Birth: 05/11/1976

## 2016-02-05 NOTE — Telephone Encounter (Signed)
Rn call patients husband about the episode that happen this weekend. Annie Main on dpr list stated his wife was in the kitchen and she was having problems speaking, getting the words out. PTs husband stated it was not a seizure. He stated it lasted about 3 minutes. He stated after 3 minutes his wife was back to normal with her speech. Pts husband did not know if Dr. Leonie Man wanted to do a scan or not. Rn stated a message will be sent to [facility]. PTs husband verbalized understanding.

## 2016-02-05 NOTE — Telephone Encounter (Signed)
I spoke to the patient and she describes a brief episode of expressive aphasia lasting only a few minutes. She was fully aware of her surroundings and did not have any seizure activity. I recommended she increase aspirin dose from 81 to 325 mg daily and ordered carotid ultrasound, transcranial Doppler studies, fasting lipid profile and hemoglobin A 1C. She was instructed to call or go to the emergency room if she had recurrent or worsening symptoms.

## 2016-02-05 NOTE — Telephone Encounter (Signed)
Patient's husband would like a call back from the nurse regarding an incident that happened over the weekend. Please call [phone].

## 2016-02-05 NOTE — Therapy (Addendum)
[facility] [address] [place], [place], [address] Phone: [phone]   Fax:  [phone]  Physical Therapy Treatment  Patient Details  Name: Ashley [name] MRN: [id number] Date of Birth: 08/08/1976 No Data Recorded  Encounter Date: 02/05/2016      PT End of Session - 02/05/16 1121    Visit Number 73   Number of Visits 65   Date for PT Re-Evaluation 02/11/16   Authorization Type BCBS. Per Laverda Page, visits now 30 for PT and OT total (15 per OT and 15 per PT). Pt wishes to continue at self pay after visit limit met.    Authorization - Visit Number 29   Authorization - Number of Visits 16   PT Start Time [phone]  pt arrived late   PT Stop Time 1013   PT Time Calculation (min) 39 min   Equipment Utilized During Treatment Gait belt   Activity Tolerance Patient tolerated treatment well   Behavior During Therapy WFL for tasks assessed/performed      Past Medical History  Diagnosis Date  . Anxiety   . ICH (intracerebral hemorrhage) ([place])   . Stroke ([place])   . Seizures [facility])     Past Surgical History  Procedure Laterality Date  . Dilation and curettage of uterus      There were no vitals filed for this visit.      Subjective Assessment - 02/05/16 0936    Subjective Pt reported she stood up on Saturday and had a "spell", where she couldn't talk for approx. 5 minutes and she felt weird. Pt reported her husband was present and almost took pt to ED, as episode reminded pt of when she first woke up in hospital. However, pt returned to normal after 5 minutes. [name] informed Dr. Brigitte Pulse and he said to go to ED if this happens again. [name] is calling Dr. Leonie Man as well. Pt reported she had been in the heat all day and was dehydrated and had not eaten much.    Pertinent History Seizures, low BP   Patient Stated Goals "Be as close to normal as much as possible and to go back to running" Get back to taking care of my  kids, 84 weeks old and 23 and 53 years old   Currently in Pain? No/denies            [facility] PT Assessment - 02/05/16 0942    Functional Gait  Assessment   Gait assessed  Yes   Gait Level Surface Walks 20 ft, slow speed, abnormal gait pattern, evidence for imbalance or deviates 10-15 in outside of the 12 in walkway width. Requires more than 7 sec to ambulate 20 ft.  7.2sec.   Change in Gait Speed Able to change speed, demonstrates mild gait deviations, deviates 6-10 in outside of the 12 in walkway width, or no gait deviations, unable to achieve a major change in velocity, or uses a change in velocity, or uses an assistive device.   Gait with Horizontal Head Turns Performs head turns smoothly with slight change in gait velocity (eg, minor disruption to smooth gait path), deviates 6-10 in outside 12 in walkway width, or uses an assistive device.   Gait with Vertical Head Turns Performs task with slight change in gait velocity (eg, minor disruption to smooth gait path), deviates 6 - 10 in outside 12 in walkway width or uses assistive device   Gait and Pivot Turn Pivot turns safely within 3 sec  and stops quickly with no loss of balance.   Step Over Obstacle Is able to step over one shoe box (4.5 in total height) but must slow down and adjust steps to clear box safely. May require verbal cueing.   Gait with Narrow Base of Support Ambulates less than 4 steps heel to toe or cannot perform without assistance.   Gait with Eyes Closed Walks 20 ft, uses assistive device, slower speed, mild gait deviations, deviates 6-10 in outside 12 in walkway width. Ambulates 20 ft in less than 9 sec but greater than 7 sec.  8.2sec.   Ambulating Backwards Walks 20 ft, uses assistive device, slower speed, mild gait deviations, deviates 6-10 in outside 12 in walkway width.   Steps Alternating feet, must use rail.   Total Score 17                     [facility] Adult PT Treatment/Exercise - 02/05/16 0942     Ambulation/Gait   Ambulation/Gait Yes   Ambulation/Gait Assistance 5: Supervision;6: Modified independent (Device/Increase time)   Ambulation/Gait Assistance Details Pt amb. with cues to improve wt. shifting onto R LE.   Ambulation Distance (Feet) 345 Feet   Assistive device None   Gait Pattern Decreased stance time - right;Decreased weight shift to right;Right foot flat;Decreased hip/knee flexion - right;Step-through pattern;Decreased step length - left;Trunk rotated posteriorly on right   Ambulation Surface Level;Indoor   Stairs Yes   Stairs Assistance 6: Modified independent (Device/Increase time)   Stairs Assistance Details (indicate cue type and reason) Pt demonstrated safe technique, no LOB.   Stair Management Technique One rail Left;One rail Right;Alternating pattern   Number of Stairs 8   Height of Stairs 6   Dynamic Gait Index   Level Surface Mild Impairment   Change in Gait Speed Mild Impairment   Gait with Horizontal Head Turns Normal   Gait with Vertical Head Turns Normal   Gait and Pivot Turn Normal   Step Over Obstacle Mild Impairment   Step Around Obstacles Normal   Steps Mild Impairment   Total Score 20      Pt ascended/descended curb x1 without AD, at MOD I level (incr. Time). No LOB and demonstrated safe technique.          PT Education - 02/05/16 1120    Education provided Yes   Education Details PT discussed goal progress and outcome measure results. PT discussed following MD's instructions regarding Saturday episode.   Person(s) Educated Patient   Methods Explanation   Comprehension Verbalized understanding          PT Short Term Goals - 01/08/16 1119    PT SHORT TERM GOAL #1   Title Pt will be IND in HEP to improve strength, balance, and endurance. Target date: 01/10/16.   Status On-going   PT SHORT TERM GOAL #2   Title Pt will improve gait speed to >/=1.8ft/sec to decrease falls risk. Target date: 07/27/15.   Status Achieved   PT SHORT TERM  GOAL #3   Title Pt will ambulate 300' with LRAD, over even terrain, at MOD I level to improve functional mobility. Target date: 07/27/15.   Status Achieved   PT SHORT TERM GOAL #4   Title Pt will perform TUG in </=13.5 seconds with LRAD to decr. falls risk. Target date: 01/10/15.   Status Achieved   PT SHORT TERM GOAL #5   Title Perform BERG and write STG and LTG as appropriate. Target date: 07/27/15.     Status Achieved   PT SHORT TERM GOAL #6   Title Pt will improve BERG to >/=51/56 to decr. falls risk. Target date: 07/27/15.   Status Achieved   PT SHORT TERM GOAL #7   Title Pt will amb. 500' over even/uneven terrain with LRAD at MOD I level to improve functional mobility. Target date: 01/10/16   Status Partially Met   PT SHORT TERM GOAL #8   Title Pt will ascend/descend curb with LRAD at MOD I level to improve functional mobility. Target date: 01/10/16   Status Partially Met   PT SHORT TERM GOAL #9   TITLE Pt will ascend/descend 12 steps with one handrail at MOD I level to improve functional mobility. Target date: 01/10/16   Status Partially Met   PT SHORT TERM GOAL #10   TITLE Pt will incr. DGI score to >/=17/24 to decr. falls risk. Target date: 01/10/16   Baseline Revised on 12/20/15, as pt scored 14/24 on DGI.   Status Achieved           PT Long Term Goals - 02/05/16 1123    PT LONG TERM GOAL #1   Title Pt will ambulate 1000' over even/uneven terrain, with LRAD, at MOD I level to improve functional mobiliity. Target date: 02/07/16/17.   Baseline All unmet goals will be carried over to new 8 week POC: 02/07/16   Status On-going   PT LONG TERM GOAL #2   Title Pt will amb. 300' over even terrain without an AD, IND, to safely amb. in home. Target date: 02/07/16   Status Partially Met   PT LONG TERM GOAL #3   Title Pt will improve gait speed to >/=2.62ft/sec, with LRAD, to amb. safely in the community. Target date: 11/12/14.   Status Achieved   PT LONG TERM GOAL #4   Title Pt will  verbalize plans to join a fitness center upon d/c from PT to continue to maintain strength and endurance gains made during PT. Target date: 02/07/16.   Status On-going   PT LONG TERM GOAL #5   Title Pt improve BERG score to >/=55/56 to decr. falls risk. Target date: 12/11/15   Status Achieved   PT LONG TERM GOAL #6   Title Pt will ascend/descend curb with LRAD at MOD I level to improve functional mobility. Target date: 02/07/16   Status Achieved   PT LONG TERM GOAL #7   Title Pt will ascend/descend 8 steps with one handrail at MOD I level to improve functional mobility. Target date: 02/07/16   Status Achieved   PT LONG TERM GOAL #8   Title Pt will ascend/descend ladder at MOD I level in order to transfer in/out of water. Target date: 02/07/16   Status On-going   PT LONG TERM GOAL  #9   TITLE Pt will be able to transfer on/off boat at MOD I level, and no LOB, in order improve functional mobility and participate in family activities. Target date: 02/07/16   Status On-going   PT LONG TERM GOAL  #10   TITLE Pt will be IIND in progressed HEP in order to improve balance, strength and flexibility. Target date: 02/07/16   Status On-going   PT LONG TERM GOAL  #11   TITLE Pt will improve DGI score to >/=20/24 to decr. falls risk. Target date: 02/07/16   Status Achieved               Plan - 02/05/16 1121    Clinical Impression Statement Pt demonstrated   progress, as she met LTGs, 6, 7, and 11, pt partially met LTG 2. PT will continue to assess goals next session and revise as appropriate. Pt would continue to benefit from skilled PT to improve safety during functional mobility.   Rehab Potential Good   Clinical Impairments Affecting Rehab Potential Seizures which may limit intensity of PT   PT Frequency 2x / week   PT Duration 8 weeks   PT Treatment/Interventions ADLs/Self Care Home Management;Neuromuscular re-education;Cognitive remediation;Biofeedback;DME Instruction;Gait training;Stair  training;Canalith Repostioning;Patient/family education;Orthotic Fit/Training;Balance training;Therapeutic exercise;Manual techniques;Therapeutic activities;Vestibular   PT Next Visit Plan Finish assessing LTGs, renew and revise goals. Write FGA goal (17/30)   PT Home Exercise Plan Stretching/strength/balance HEP   Consulted and Agree with Plan of Care Patient      Patient will benefit from skilled therapeutic intervention in order to improve the following deficits and impairments:  Abnormal gait, Decreased endurance, Impaired sensation, Decreased knowledge of precautions, Decreased activity tolerance, Decreased knowledge of use of DME, Decreased strength, Impaired UE functional use, Impaired tone, Decreased balance, Decreased mobility, Decreased cognition, Decreased range of motion, Decreased safety awareness, Decreased coordination, Impaired flexibility, Postural dysfunction  Visit Diagnosis: Other abnormalities of gait and mobility  Spastic hemiplegia affecting right dominant side [facility])     Problem List Patient Active Problem List   Diagnosis Date Noted  . Spastic hemiplegia and hemiparesis affecting dominant side ([place]) 09/13/2015  . Adhesive capsulitis of right shoulder 08/24/2015  . Symptomatic partial epilepsy with simple partial seizures ([place]) 08/21/2015  . Nontraumatic cortical hemorrhage of cerebral hemisphere ([place])   . Seizure ([place])   . Seizures ([place])   . Adjustment disorder with depressed mood   . Contracture of muscle ankle and foot 06/11/2015  . Hemiplga fol ntrm intcrbl hemor aff right dominant side 06/06/2015  . Left-sided intracerebral hemorrhage ([place]) 06/04/2015  . Seizure disorder as sequela of cerebrovascular accident ([place])   . Seizure disorder ([place]) 06/03/2015  . Sepsis ([place]) 06/03/2015  . UTI (urinary tract infection) 06/03/2015  . Hypotension 06/02/2015  . Right spastic hemiparesis ([place]) 05/28/2015  . Aphasia following nontraumatic intracerebral hemorrhage  05/28/2015  . History of anxiety disorder 05/28/2015  . ICH (intracerebral hemorrhage) ([place])   . Seizure disorder, nonconvulsive, with status epilepticus ([place])   . Cerebral venous thrombosis of cortical vein   . Cytotoxic cerebral edema ([place])   . IVH (intraventricular hemorrhage) ([place])   . Term pregnancy 05/07/2015  . Spontaneous vaginal delivery 05/07/2015    Binyomin Brann L 02/05/2016, 11:25 AM  [facility] [address] [place], [place], [address] Phone: [phone]   Fax:  [phone]  Name: Ashley [name] MRN: [id number] Date of Birth: 1975-12-23    Geoffry Paradise, PT,DPT 02/05/2016 11:25 AM Phone: [phone] Fax: [phone]

## 2016-02-06 ENCOUNTER — Ambulatory Visit (INDEPENDENT_AMBULATORY_CARE_PROVIDER_SITE_OTHER): Payer: BLUE CROSS/BLUE SHIELD

## 2016-02-06 ENCOUNTER — Other Ambulatory Visit: Payer: BLUE CROSS/BLUE SHIELD

## 2016-02-06 DIAGNOSIS — G458 Other transient cerebral ischemic attacks and related syndromes: Secondary | ICD-10-CM

## 2016-02-07 ENCOUNTER — Ambulatory Visit: Payer: BLUE CROSS/BLUE SHIELD | Admitting: Occupational Therapy

## 2016-02-07 ENCOUNTER — Ambulatory Visit: Payer: BLUE CROSS/BLUE SHIELD

## 2016-02-07 ENCOUNTER — Other Ambulatory Visit (INDEPENDENT_AMBULATORY_CARE_PROVIDER_SITE_OTHER): Payer: Self-pay

## 2016-02-07 ENCOUNTER — Encounter: Payer: Self-pay | Admitting: Occupational Therapy

## 2016-02-07 ENCOUNTER — Ambulatory Visit (INDEPENDENT_AMBULATORY_CARE_PROVIDER_SITE_OTHER): Payer: BLUE CROSS/BLUE SHIELD | Admitting: Psychology

## 2016-02-07 DIAGNOSIS — F429 Obsessive-compulsive disorder, unspecified: Secondary | ICD-10-CM | POA: Diagnosis not present

## 2016-02-07 DIAGNOSIS — G8111 Spastic hemiplegia affecting right dominant side: Secondary | ICD-10-CM

## 2016-02-07 DIAGNOSIS — R293 Abnormal posture: Secondary | ICD-10-CM

## 2016-02-07 DIAGNOSIS — R2689 Other abnormalities of gait and mobility: Secondary | ICD-10-CM

## 2016-02-07 DIAGNOSIS — R208 Other disturbances of skin sensation: Secondary | ICD-10-CM

## 2016-02-07 DIAGNOSIS — R41844 Frontal lobe and executive function deficit: Secondary | ICD-10-CM

## 2016-02-07 DIAGNOSIS — F4323 Adjustment disorder with mixed anxiety and depressed mood: Secondary | ICD-10-CM

## 2016-02-07 DIAGNOSIS — E785 Hyperlipidemia, unspecified: Secondary | ICD-10-CM

## 2016-02-07 DIAGNOSIS — G458 Other transient cerebral ischemic attacks and related syndromes: Secondary | ICD-10-CM

## 2016-02-07 DIAGNOSIS — M25611 Stiffness of right shoulder, not elsewhere classified: Secondary | ICD-10-CM

## 2016-02-07 DIAGNOSIS — Z0289 Encounter for other administrative examinations: Secondary | ICD-10-CM

## 2016-02-07 NOTE — Progress Notes (Signed)
[facility]  [address]   Telephone [phone] [address] Fax [phone] [place], [place] [address]   Psychology Progress Note   Name:  Ashley [name] Date of Birth:  [birth year] MRN:  [id number]  Date:02/07/2016 (53m) psychotherapy [place] reported that she experienced brief period this past weekend during which she was unable to speak, which has understandably increased her level of anxiety about her health. She has already consulted with her neurologist. She also stated her belief that her memory has declined further since that incident. She reported having been able to reduce the frequency of checking behavior as we discussed last session. Other issues discussed her unfairly blaming herself for having had a stroke (e.g., "maybe I exercised too soon after my son was born") in context of the uncertain etiology of her stroke, coping with her stroke-related mobility and cognitive limitiations as a self-described "perfectionist", and how her stroke and its aftermath may have disrupted her ability to bond with her infant.   Diagnostic Impressions: Adjustment disorder with anxious and depressed mood [F43.23] Obsessive compulsive disorder with good insight [F42.9]   Continue suppotrive and cognitive-behavioral oriented psychotherapy. Return in one week.  Jamey Ripa, Ph.D Licensed Psychologist

## 2016-02-07 NOTE — Therapy (Signed)
[facility] [address] [place], [place], [address] Phone: [phone]   Fax:  [phone]  Physical Therapy Treatment  Patient Details  Name: Ashley [name] MRN: [id number] Date of Birth: 1976-03-09 No Data Recorded  Encounter Date: 02/07/2016      PT End of Session - 02/07/16 1300    Visit Number 44   Number of Visits 86  PT requesting add'l 2x/week for 8 weeks for 79 visits total   Date for PT Re-Evaluation 02/11/16   Authorization Type BCBS. Per Laverda Page, visits now 30 for PT and OT total (15 per OT and 15 per PT). Pt wishes to continue at self pay after visit limit met.    Authorization - Visit Number 30   Authorization - Number of Visits 16   PT Start Time 0930   PT Stop Time 1013   PT Time Calculation (min) 43 min   Equipment Utilized During Treatment Gait belt   Activity Tolerance Patient tolerated treatment well   Behavior During Therapy WFL for tasks assessed/performed      Past Medical History  Diagnosis Date  . Anxiety   . ICH (intracerebral hemorrhage) ([place])   . Stroke ([place])   . Seizures [facility])     Past Surgical History  Procedure Laterality Date  . Dilation and curettage of uterus      There were no vitals filed for this visit.      Subjective Assessment - 02/07/16 0933    Subjective Pt reported she had TCD and carotid doppler performed yesterday, and blood work today. Pt denied falls since last visit.    Pertinent History Seizures, low BP   Patient Stated Goals "Be as close to normal as much as possible and to go back to running" Get back to taking care of my kids, 14 weeks old and 56 and 93 years old   Currently in Pain? No/denies         Neuro re-ed: Pt ascended/descended ladder with min guard to min A (2x3steps) to mimic transfer on/off ladder boat. Cues for technique and assist for R foot placement and to shift wt. Pt performed sidestepping to/from rockerboard and bosu ball to 6"  step to mimic transfer on/off boat to/from dock, [employer]. Cues for technique. Performed with B UE in // bars  and min guard for safety.                [facility] Adult PT Treatment/Exercise - 02/07/16 0935    Ambulation/Gait   Ambulation/Gait Yes   Ambulation/Gait Assistance 6: Modified independent (Device/Increase time);5: Supervision;4: Min guard   Ambulation/Gait Assistance Details Pt amb. at MOD I over even terrain and min guard to S over grassy terrain 2/2 R ankle eversion. Cues to improve wt. shifting onto RLE and to decr. R elbow flexion when amb. at [place]. speeds. BP after amb: 94/55 and HR: 77bpm.   Ambulation Distance (Feet) 1000 Feet   Assistive device None   Gait Pattern Decreased stance time - right;Decreased weight shift to right;Right foot flat;Decreased hip/knee flexion - right;Step-through pattern;Decreased step length - left;Trunk rotated posteriorly on right   Ambulation Surface Level;Unlevel;Indoor;Outdoor;Paved;Grass                PT Education - 02/07/16 1300    Education provided Yes   Education Details PT discussed goals that PT assessed today and discussed new goal planning for next POC.    Person(s) Educated Patient   Methods Explanation  Comprehension Verbalized understanding          PT Short Term Goals - 01/08/16 1119    PT SHORT TERM GOAL #1   Title Pt will be IND in HEP to improve strength, balance, and endurance. Target date: 01/10/16.   Status On-going   PT SHORT TERM GOAL #2   Title Pt will improve gait speed to >/=1.41f/sec to decrease falls risk. Target date: 07/27/15.   Status Achieved   PT SHORT TERM GOAL #3   Title Pt will ambulate 300' with LRAD, over even terrain, at MOD I level to improve functional mobility. Target date: 07/27/15.   Status Achieved   PT SHORT TERM GOAL #4   Title Pt will perform TUG in </=13.5 seconds with LRAD to decr. falls risk. Target date: 01/10/15.   Status Achieved   PT SHORT TERM GOAL #5   Title  Perform BERG and write STG and LTG as appropriate. Target date: 07/27/15.   Status Achieved   PT SHORT TERM GOAL #6   Title Pt will improve BERG to >/=51/56 to decr. falls risk. Target date: 07/27/15.   Status Achieved   PT SHORT TERM GOAL #7   Title Pt will amb. 500' over even/uneven terrain with LRAD at MOD I level to improve functional mobility. Target date: 01/10/16   Status Partially Met   PT SHORT TERM GOAL #8   Title Pt will ascend/descend curb with LRAD at MOD I level to improve functional mobility. Target date: 01/10/16   Status Partially Met   PT SHORT TERM GOAL #9   TITLE Pt will ascend/descend 12 steps with one handrail at MOD I level to improve functional mobility. Target date: 01/10/16   Status Partially Met   PT SHORT TERM GOAL #10   TITLE Pt will incr. DGI score to >/=17/24 to decr. falls risk. Target date: 01/10/16   Baseline Revised on 12/20/15, as pt scored 14/24 on DGI.   Status Achieved           PT Long Term Goals - 02/07/16 1303    PT LONG TERM GOAL #1   Title Pt will ambulate 1000' over even/uneven terrain, with LRAD, at MOD I level to improve functional mobiliity. Target date: 02/07/16/17.   Baseline All unmet goals will be carried over to new 8 week POC: 02/07/16   Status Partially Met   PT LONG TERM GOAL #2   Title Pt will amb. 300' over even terrain without an AD, IND, to safely amb. in home. Target date: 02/07/16   Status Partially Met   PT LONG TERM GOAL #3   Title Pt will improve gait speed to >/=2.659fsec, with LRAD, to amb. safely in the community. Target date: 11/12/14.   Status Achieved   PT LONG TERM GOAL #4   Title Pt will verbalize plans to join a fitness center upon d/c from PT to continue to maintain strength and endurance gains made during PT. Target date: 02/07/16.   Status Deferred   PT LONG TERM GOAL #5   Title Pt improve BERG score to >/=55/56 to decr. falls risk. Target date: 12/11/15   Status Achieved   PT LONG TERM GOAL #6   Title Pt will  ascend/descend curb with LRAD at MOD I level to improve functional mobility. Target date: 02/07/16   Status Achieved   PT LONG TERM GOAL #7   Title Pt will ascend/descend 8 steps with one handrail at MOD I level to improve functional mobility. Target date: 02/07/16  Status Achieved   PT LONG TERM GOAL #8   Title Pt will ascend/descend ladder at MOD I level in order to transfer in/out of water. Target date: 02/07/16   Status Partially Met   PT LONG TERM GOAL  #9   TITLE Pt will be able to transfer on/off boat at MOD I level, and no LOB, in order improve functional mobility and participate in family activities. Target date: 02/07/16   Status Partially Met   PT LONG TERM GOAL  #10   TITLE Pt will be IIND in progressed HEP in order to improve balance, strength and flexibility. Target date: 02/07/16   Status On-going   PT LONG TERM GOAL  #11   TITLE Pt will improve DGI score to >/=20/24 to decr. falls risk. Target date: 02/07/16   Status Achieved               Plan - 02/07/16 1301    Clinical Impression Statement Pt demonstrated progress as she partially met LTGs 1, 8 and 9. PT will assess HEP (LTG 10) next session. Pt continues to require assist during high level balance activities due to weakness and incr. postural sway. Pt would continue to benefit from skilled PT to improve safety during functional mobility. PT requesting renewal for add'l 2x/week for 8 weeks.    Rehab Potential Good   Clinical Impairments Affecting Rehab Potential Seizures which may limit intensity of PT   PT Frequency 2x / week   PT Duration 8 weeks   PT Treatment/Interventions ADLs/Self Care Home Management;Neuromuscular re-education;Cognitive remediation;Biofeedback;DME Instruction;Gait training;Stair training;Canalith Repostioning;Patient/family education;Orthotic Fit/Training;Balance training;Therapeutic exercise;Manual techniques;Therapeutic activities;Vestibular   PT Next Visit Plan Assess HEP and progress as  tolerated. Update goals next session and write FGA goal.   PT Home Exercise Plan Stretching/strength/balance HEP   Consulted and Agree with Plan of Care Patient      Patient will benefit from skilled therapeutic intervention in order to improve the following deficits and impairments:  Abnormal gait, Decreased endurance, Impaired sensation, Decreased knowledge of precautions, Decreased activity tolerance, Decreased knowledge of use of DME, Decreased strength, Impaired UE functional use, Impaired tone, Decreased balance, Decreased mobility, Decreased cognition, Decreased range of motion, Decreased safety awareness, Decreased coordination, Impaired flexibility, Postural dysfunction  Visit Diagnosis: Other abnormalities of gait and mobility  Spastic hemiplegia affecting right dominant side [facility])     Problem List Patient Active Problem List   Diagnosis Date Noted  . Spastic hemiplegia and hemiparesis affecting dominant side ([place]) 09/13/2015  . Adhesive capsulitis of right shoulder 08/24/2015  . Symptomatic partial epilepsy with simple partial seizures ([place]) 08/21/2015  . Nontraumatic cortical hemorrhage of cerebral hemisphere ([place])   . Seizure ([place])   . Seizures ([place])   . Adjustment disorder with depressed mood   . Contracture of muscle ankle and foot 06/11/2015  . Hemiplga fol ntrm intcrbl hemor aff right dominant side 06/06/2015  . Left-sided intracerebral hemorrhage ([place]) 06/04/2015  . Seizure disorder as sequela of cerebrovascular accident ([place])   . Seizure disorder ([place]) 06/03/2015  . Sepsis ([place]) 06/03/2015  . UTI (urinary tract infection) 06/03/2015  . Hypotension 06/02/2015  . Right spastic hemiparesis ([place]) 05/28/2015  . 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 History of anxiety disorder 05/28/2015  . ICH (intracerebral hemorrhage) ([place])   . Seizure disorder, nonconvulsive, with status epilepticus ([place])   . Cerebral venous thrombosis of  cortical vein   . Cytotoxic cerebral edema ([place])   . IVH (intraventricular hemorrhage) ([place])   .  Term pregnancy 05/07/2015  . Spontaneous vaginal delivery 05/07/2015    Mafalda Mcginniss L 02/07/2016, 1:08 PM  [facility] [address] [place], [place], [address] Phone: [phone]   Fax:  [phone]  Name: Clea Dubach MRN: [id number] Date of Birth: 02-09-1976     Geoffry Paradise, PT,DPT 02/07/2016 1:08 PM Phone: [phone] Fax: [phone]

## 2016-02-07 NOTE — Therapy (Signed)
[facility] [address] [place], [place], [address] Phone: [phone]   Fax:  [phone]  Occupational Therapy Treatment  Patient Details  Name: Ashley [name] MRN: [id number] Date of Birth: [birth year] No Data Recorded  Encounter Date: 02/07/2016      OT End of Session - 02/07/16 1311    Visit Number 30   Number of Visits 33   Date for OT Re-Evaluation 02/12/16   Authorization Type BCBS   Authorization Time Period 30 visit COMBINED between PT/OT;  pt has utilized benefit and is now self pay.    Authorization - Visit Number 30   Authorization - Number of Visits 33   OT Start Time 1017   OT Stop Time 1100   OT Time Calculation (min) 43 min   Activity Tolerance Patient tolerated treatment well      Past Medical History  Diagnosis Date  . Anxiety   . ICH (intracerebral hemorrhage) ([place])   . Stroke ([place])   . Seizures [facility])     Past Surgical History  Procedure Laterality Date  . Dilation and curettage of uterus      There were no vitals filed for this visit.      Subjective Assessment - 02/07/16 1307    Subjective  I saw the neurologist and they are doing testing.   Pertinent History see epic snapshot; pt with SAH 2 weeks after deliverying her 3rd child   Patient Stated Goals to be able to use my R arm normally   Currently in Pain? No/denies                      OT Treatments/Exercises (OP) - 02/07/16 0001    Neurological Re-education Exercises   Other Exercises 1 Neuro re ed to address RUE strength/stability and trunk control with overhead reach activities.  Facilitating elongation on R with strength for functional tasks in sitting standing and walking.                   OT Short Term Goals - 02/07/16 1309    OT SHORT TERM GOAL  #11   TITLE Pt will be mod I with upgraded HEP - 01/08/2016   Status Achieved   OT SHORT TERM GOAL  #12   TITLE Pt will be able to pick up 8  pound object x 4 trials bilaterally to place on overhead shelf requiring at least 120* of shoulder flexion (for household mgmt tasks, grocery shopping ,etc)   Status Achieved   OT SHORT TERM GOAL  #13   TITLE Pt will be able to lift lightweight object unilaterally with RUE to 120* of shoulder flexion (baseline 115*)   Status Achieved  able to lift 4 pound object to this height with very minimal compensations   OT SHORT TERM GOAL  #14   TITLE Pt will be able to lift baby into and out of tub while in kneeling from and to baby seat using both UE's   Status On-going  Pt sat on gma's lap and pt able to lift baby from gma's lap into tub and then lift baby out of tub and hand to gma            OT Long Term Goals - 02/07/16 1309    OT LONG TERM GOAL #7   Status Partially Met   OT LONG TERM GOAL  #10   TITLE Pt will be mod I with upgraded HEP - 02/12/2016 (  date adjusted as pt did not attend therapy last week)   Status Achieved   OT LONG TERM GOAL  #11   TITLE Pt will be able to pick up 5 pound object with RUE to place on overhead shelf x4 trials that requres at least 120* of shoulder flexion (for home mgmt tasks, care of child, volunteering)   Status Achieved   OT LONG TERM GOAL  #12   TITLE Pt will be able to carry at least a 15 pound object using both hands without drops for 20 feet.   Status Achieved  145 feet carrying box with 15 pound weight   OT LONG TERM GOAL  #13   TITLE Pt will be able to get off toilet without using UE's (to allow her to use public restrooms, boat bathroom that are without grab bars)   Status Achieved               Plan - 02/07/16 1310    Clinical Impression Statement Pt continues with good progress toward goals. Pt with improving RUE strength and control with overhead reach.   Rehab Potential Good   Clinical Impairments Affecting Rehab Potential cognition, impaired sensation and anxiety   OT Frequency 2x / week   OT Duration 8 weeks   OT  Treatment/Interventions Self-care/ADL training;Therapeutic exercise;Patient/family education;Balance training;Ultrasound;Neuromuscular education;Manual Therapy;Splinting;Therapeutic exercises;Energy conservation;Parrafin;DME and/or AE instruction;Therapeutic activities;Cognitive remediation/compensation;Gait Training;Fluidtherapy;Electrical Stimulation;Moist Heat;Contrast Bath;Passive range of motion;Visual/perceptual remediation/compensation   Plan NMR for trunk control, overhead reach with strength, balance and transitional movments   Consulted and Agree with Plan of Care Patient      Patient will benefit from skilled therapeutic intervention in order to improve the following deficits and impairments:  Abnormal gait, Decreased coordination, Decreased range of motion, Difficulty walking, Impaired flexibility, Decreased endurance, Decreased safety awareness, Increased edema, Impaired tone, Decreased knowledge of precautions, Decreased activity tolerance, Decreased balance, Decreased knowledge of use of DME, Pain, Impaired UE functional use, Impaired vision/preception, Decreased cognition, Decreased mobility, Decreased strength  Visit Diagnosis: Other abnormalities of gait and mobility  Spastic hemiplegia affecting right dominant side ([facility])  Stiffness of right shoulder, not elsewhere classified  Abnormal posture  Frontal lobe and executive function deficit  Other disturbances of skin sensation    Problem List Patient Active Problem List   Diagnosis Date Noted  . Spastic hemiplegia and hemiparesis affecting dominant side ([place]) 09/13/2015  . Adhesive capsulitis of right shoulder 08/24/2015  . Symptomatic partial epilepsy with simple partial seizures ([place]) 08/21/2015  . Nontraumatic cortical hemorrhage of cerebral hemisphere ([place])   . Seizure ([place])   . Seizures ([place])   . Adjustment disorder with depressed mood   . Contracture of muscle ankle and foot 06/11/2015  . Hemiplga fol ntrm  intcrbl hemor aff right dominant side 06/06/2015  . Left-sided intracerebral hemorrhage ([place]) 06/04/2015  . Seizure disorder as sequela of cerebrovascular accident ([place])   . Seizure disorder ([place]) 06/03/2015  . Sepsis ([place]) 06/03/2015  . UTI (urinary tract infection) 06/03/2015  . Hypotension 06/02/2015  . Right spastic hemiparesis ([place]) 05/28/2015  . 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 History of anxiety disorder 05/28/2015  . ICH (intracerebral hemorrhage) ([place])   . Seizure disorder, nonconvulsive, with status epilepticus ([place])   . Cerebral venous thrombosis of cortical vein   . Cytotoxic cerebral edema ([place])   . IVH (intraventricular hemorrhage) ([place])   . Term pregnancy 05/07/2015  . Spontaneous vaginal delivery 05/07/2015    Quay Burow, OTR/L 02/07/2016, 1:12 PM  [facility] [address] [place], [place], [address] Phone: [phone]   Fax:  [phone]  Name: Anayely Constantine MRN: [id number] Date of Birth: 1976-06-23

## 2016-02-08 ENCOUNTER — Telehealth: Payer: Self-pay | Admitting: Neurology

## 2016-02-08 LAB — LIPID PANEL
CHOL/HDL RATIO: 2.2 ratio (ref 0.0–4.4)
Cholesterol, Total: 194 mg/dL (ref 100–199)
HDL: 90 mg/dL (ref >39)
LDL CALC: 95 mg/dL (ref 0–99)
Triglycerides: 45 mg/dL (ref 0–149)
VLDL CHOLESTEROL CAL: 9 mg/dL (ref 5–40)

## 2016-02-08 LAB — HEMOGLOBIN A1C
ESTIMATED AVERAGE GLUCOSE: 100 mg/dL
Hgb A1c MFr Bld: 5.1 % (ref 4.8–5.6)

## 2016-02-08 NOTE — Telephone Encounter (Addendum)
Rn call patients husband back. He stated his wife has concerns about taking 325 because she had a hemorrhagic stroke in the past. Also she had a cartoid doppler done and wants the results.Pts husband steve stated his wife still feels funny. Rn explain message will be sent to Dr. Leonie Man. [phone] is the number for the husband.

## 2016-02-08 NOTE — Telephone Encounter (Signed)
Patient's husband is calling  °

## 2016-02-08 NOTE — Telephone Encounter (Signed)
I returned call from the patient's husband would call concerning about patient's aspirin and bleeding risk given history of hemorrhagic stroke and feeling of postural dizziness. I reviewed patient's risk factor profile and agree she does not need to take aspirin. I also discussed results of carotid ultrasound and transcranial Doppler study done on 02/07/16 in office both been unremarkable. I advised the patient to tell his wife do orthostatic tolerance exercises to help with her postural dizziness. I also reviewed results of hemoglobin A1c being normal and lipid profile being borderline. I advised the patient to go on a low-fat diet and be active and exercise. He voiced understanding.

## 2016-02-08 NOTE — Telephone Encounter (Signed)
Patient's husband is calling to discuss his wife's condition. He would not go into detail.

## 2016-02-12 ENCOUNTER — Encounter: Payer: Self-pay | Admitting: Occupational Therapy

## 2016-02-12 ENCOUNTER — Ambulatory Visit: Payer: BLUE CROSS/BLUE SHIELD | Admitting: Occupational Therapy

## 2016-02-12 ENCOUNTER — Ambulatory Visit: Payer: BLUE CROSS/BLUE SHIELD | Attending: Psychology

## 2016-02-12 DIAGNOSIS — G8111 Spastic hemiplegia affecting right dominant side: Secondary | ICD-10-CM | POA: Diagnosis present

## 2016-02-12 DIAGNOSIS — F4323 Adjustment disorder with mixed anxiety and depressed mood: Secondary | ICD-10-CM | POA: Diagnosis present

## 2016-02-12 DIAGNOSIS — R293 Abnormal posture: Secondary | ICD-10-CM | POA: Diagnosis present

## 2016-02-12 DIAGNOSIS — M25611 Stiffness of right shoulder, not elsewhere classified: Secondary | ICD-10-CM

## 2016-02-12 DIAGNOSIS — R2689 Other abnormalities of gait and mobility: Secondary | ICD-10-CM | POA: Diagnosis not present

## 2016-02-12 DIAGNOSIS — R208 Other disturbances of skin sensation: Secondary | ICD-10-CM | POA: Diagnosis present

## 2016-02-12 DIAGNOSIS — R269 Unspecified abnormalities of gait and mobility: Secondary | ICD-10-CM | POA: Insufficient documentation

## 2016-02-12 DIAGNOSIS — R29898 Other symptoms and signs involving the musculoskeletal system: Secondary | ICD-10-CM | POA: Diagnosis present

## 2016-02-12 DIAGNOSIS — R41844 Frontal lobe and executive function deficit: Secondary | ICD-10-CM

## 2016-02-12 DIAGNOSIS — R2991 Unspecified symptoms and signs involving the musculoskeletal system: Secondary | ICD-10-CM | POA: Insufficient documentation

## 2016-02-12 NOTE — Therapy (Signed)
[facility] [address] [place], [place], [address] Phone: [phone]   Fax:  [phone]  Occupational Therapy Treatment  Patient Details  Name: Ashley [name] MRN: [id number] Date of Birth: [birth year] No Data Recorded  Encounter Date: 02/12/2016      OT End of Session - 02/12/16 1301    Visit Number 31   Number of Visits 37   Date for OT Re-Evaluation 04/08/16   Authorization Type BCBS   Authorization Time Period 30 visit COMBINED between PT/OT;  pt has utilized benefit and is now self pay.    OT Start Time 1016   OT Stop Time 1101   OT Time Calculation (min) 45 min   Activity Tolerance Patient tolerated treatment well      Past Medical History  Diagnosis Date  . Anxiety   . ICH (intracerebral hemorrhage) ([place])   . Stroke ([place])   . Seizures [facility])     Past Surgical History  Procedure Laterality Date  . Dilation and curettage of uterus      There were no vitals filed for this visit.      Subjective Assessment - 02/12/16 1021    Subjective  I am scared to try and carry the baby   Pertinent History see epic snapshot; pt with [facility] 2 weeks after deliverying her 3rd child   Patient Stated Goals to be able to use my R arm normally   Currently in Pain? No/denies                      OT Treatments/Exercises (OP) - 02/12/16 0001    Neurological Re-education Exercises   Other Exercises 1 Neuro re ed to address trunk control, balance, RUE control and functional use and functional ambulation. Pt requires supervision to occassional min a to carry heavier objects while ambulating.                   OT Short Term Goals - 02/12/16 1242    OT SHORT TERM GOAL  #11   TITLE Pt will be mod I with upgraded HEP - 01/08/2016   Status Achieved   OT SHORT TERM GOAL  #12   TITLE Pt will be able to pick up 8 pound object x 4 trials bilaterally to place on overhead shelf requiring at least 120* of  shoulder flexion (for household mgmt tasks, grocery shopping ,etc)   Status Achieved   OT SHORT TERM GOAL  #13   TITLE Pt will be able to lift lightweight object unilaterally with RUE to 120* of shoulder flexion (baseline 115*)   Status Achieved  able to lift 4 pound object to this height with very minimal compensations   OT SHORT TERM GOAL  #14   TITLE Pt will be able to lift baby into and out of tub while in kneeling from and to baby seat using both UE's - 03/11/2016   Status On-going  Pt sat on gma's lap and pt able to lift baby from gma's lap into tub and then lift baby out of tub and hand to gma            OT Long Term Goals - 02/12/16 1242    OT LONG TERM GOAL #1   Title Pt will demonstrate ability to carry 18 pound object for at least 600 feet during functional activity   Status New   OT LONG TERM GOAL #2   Title Pt will be mod I  with sweeping kitchen floor using both UE's   Status New   OT LONG TERM GOAL #3   Title Pt will be mod I stepping over ledge of baby gate holding items in her hands   Status New   OT LONG TERM GOAL #4   Title Pt will demonstrate abilty to turn wheel of care functional radius using both hands   Status New   OT LONG TERM GOAL #5   Title Pt wil demonstrate ability to carry 10 pound object in RUE up/down stairs using railing   Status New   OT LONG TERM GOAL #7   Status Partially Met   OT LONG TERM GOAL  #10   TITLE Pt will be mod I with upgraded HEP - 02/12/2016 (date adjusted as pt did not attend therapy last week)   Status Achieved   OT LONG TERM GOAL  #11   TITLE Pt will be able to pick up 5 pound object with RUE to place on overhead shelf x4 trials that requres at least 120* of shoulder flexion (for home mgmt tasks, care of child, volunteering)   Status Achieved   OT LONG TERM GOAL  #12   TITLE Pt will be able to carry at least a 15 pound object using both hands without drops for 20 feet.   Status Achieved  145 feet carrying box with 15 pound  weight   OT LONG TERM GOAL  #13   TITLE Pt will be able to get off toilet without using UE's (to allow her to use public restrooms, boat bathroom that are without grab bars)   Status Achieved               Plan - 02/12/16 1258    Clinical Impression Statement Pt continues to make excellent progress.  [name] still assisting in home with care of 41 month old and pt wishes to gain full independence. Will renew with new goals. Pt privately paying for services at this time.    Rehab Potential Good   Clinical Impairments Affecting Rehab Potential cognition, impaired sensation and anxiety   OT Frequency 2x / week   OT Duration 8 weeks   OT Treatment/Interventions Self-care/ADL training;Therapeutic exercise;Patient/family education;Balance training;Ultrasound;Neuromuscular education;Manual Therapy;Splinting;Therapeutic exercises;Energy conservation;Parrafin;DME and/or AE instruction;Therapeutic activities;Cognitive remediation/compensation;Gait Training;Fluidtherapy;Electrical Stimulation;Moist Heat;Contrast Bath;Passive range of motion;Visual/perceptual remediation/compensation   Plan NMR for trunk control, overhead reach with strength, carrying items, balance and transitional movements   Consulted and Agree with Plan of Care Patient      Patient will benefit from skilled therapeutic intervention in order to improve the following deficits and impairments:  Abnormal gait, Decreased coordination, Decreased range of motion, Difficulty walking, Impaired flexibility, Decreased endurance, Decreased safety awareness, Increased edema, Impaired tone, Decreased knowledge of precautions, Decreased activity tolerance, Decreased balance, Decreased knowledge of use of DME, Pain, Impaired UE functional use, Impaired vision/preception, Decreased cognition, Decreased mobility, Decreased strength  Visit Diagnosis: Spastic hemiplegia affecting right dominant side (HCC) - Plan: Ot plan of care  cert/re-cert  Stiffness of right shoulder, not elsewhere classified - Plan: Ot plan of care cert/re-cert  Abnormal posture - Plan: Ot plan of care cert/re-cert  Other abnormalities of gait and mobility - Plan: Ot plan of care cert/re-cert  Frontal lobe and executive function deficit - Plan: Ot plan of care cert/re-cert  Other disturbances of skin sensation - Plan: Ot plan of care cert/re-cert    Problem List Patient Active Problem List   Diagnosis Date Noted  . Spastic hemiplegia  and hemiparesis affecting dominant side ([place]) 09/13/2015  . Adhesive capsulitis of right shoulder 08/24/2015  . Symptomatic partial epilepsy with simple partial seizures ([place]) 08/21/2015  . Nontraumatic cortical hemorrhage of cerebral hemisphere ([place])   . Seizure ([place])   . Seizures ([place])   . Adjustment disorder with depressed mood   . Contracture of muscle ankle and foot 06/11/2015  . Hemiplga fol ntrm intcrbl hemor aff right dominant side 06/06/2015  . Left-sided intracerebral hemorrhage ([place]) 06/04/2015  . Seizure disorder as sequela of cerebrovascular accident ([place])   . Seizure disorder ([place]) 06/03/2015  . Sepsis ([place]) 06/03/2015  . UTI (urinary tract infection) 06/03/2015  . Hypotension 06/02/2015  . Right spastic hemiparesis ([place]) 05/28/2015  . 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 History of anxiety disorder 05/28/2015  . ICH (intracerebral hemorrhage) ([place])   . Seizure disorder, nonconvulsive, with status epilepticus ([place])   . Cerebral venous thrombosis of cortical vein   . Cytotoxic cerebral edema ([place])   . IVH (intraventricular hemorrhage) ([place])   . Term pregnancy 05/07/2015  . Spontaneous vaginal delivery 05/07/2015    Quay Burow, OTR/L 02/12/2016, 1:06 PM  [facility] [address] [place], [place], [address] Phone: [phone]   Fax:  [phone]  Name: Ashley [name] MRN:  [id number] Date of Birth: 1976-04-16

## 2016-02-12 NOTE — Therapy (Signed)
[facility] [address] [place], [place], [address] Phone: [phone]   Fax:  [phone]  Physical Therapy Treatment  Patient Details  Name: Ashley [name] MRN: [id number] Date of Birth: 1976-04-06 No Data Recorded  Encounter Date: 02/12/2016      PT End of Session - 02/13/16 1112    Visit Number 36   Number of Visits 72   Date for PT Re-Evaluation 04/11/16   Authorization Type BCBS. Per Laverda Page, visits now 30 for PT and OT total (15 per OT and 15 per PT). Pt wishes to continue at self pay after visit limit met.    Authorization - Visit Number 31   Authorization - Number of Visits 16   PT Start Time 0935   PT Stop Time 1015   PT Time Calculation (min) 40 min   Activity Tolerance Patient tolerated treatment well   Behavior During Therapy WFL for tasks assessed/performed      Past Medical History  Diagnosis Date  . Anxiety   . ICH (intracerebral hemorrhage) ([place])   . Stroke ([place])   . Seizures [facility])     Past Surgical History  Procedure Laterality Date  . Dilation and curettage of uterus      There were no vitals filed for this visit.      Subjective Assessment - 02/12/16 0937    Subjective Pt denied falls or changes since last visit. Pt reported test results were negative.   Patient Stated Goals "Be as close to normal as much as possible and to go back to running" Get back to taking care of my kids, 33 weeks old and 4 and 6 years old   Currently in Pain? No/denies          Therex: Reviewed HEP -Terminal knee ext x10 reps with blue reps and with black band x20 reps. Cues to obtain full knee ext with black band. Performed at // bars. With UE support. -Sidestepping 4x7' no band and 4x7' with yellow band at // bars with UE support. -Wall squat to approx. 70 degrees of hip flexion with 10 sec. Hold x10 reps. Cues to improve wt. Shifting onto RLE. Constance Haw with B hip abd x10 -Bridges with LLE extended  to progress to RLE single leg bride (gradually) 3x10. Cues for technique. Pt require rest breaks 2/2 fatigue.                       PT Education - 02/13/16 1111    Education provided Yes   Education Details PT reviewed HEP and progressed as tolerated. PT requested pt bring exercises next session so PT can streamline HEP.   Person(s) Educated Patient   Methods Explanation;Demonstration;Verbal cues;Handout   Comprehension Returned demonstration;Verbalized understanding          PT Short Term Goals - 02/13/16 1118    PT SHORT TERM GOAL #1   Title Pt will be IND in HEP to improve strength, balance, and endurance. Target date: 01/10/16.   Status Achieved   PT SHORT TERM GOAL #2   Title Pt will improve gait speed to >/=1.56f/sec to decrease falls risk. Target date: 07/27/15.   Status Achieved   PT SHORT TERM GOAL #3   Title Pt will ambulate 300' with LRAD, over even terrain, at MOD I level to improve functional mobility. Target date: 07/27/15.   Status Achieved   PT SHORT TERM GOAL #4   Title Pt will perform TUG in </=13.5  seconds with LRAD to decr. falls risk. Target date: 01/10/15.   Status Achieved   PT SHORT TERM GOAL #5   Title Perform BERG and write STG and LTG as appropriate. Target date: 07/27/15.   Status Achieved   Additional Short Term Goals   Additional Short Term Goals Yes   PT SHORT TERM GOAL #6   Title Pt will improve BERG to >/=51/56 to decr. falls risk. Target date: 07/27/15.   Status Achieved   PT SHORT TERM GOAL #7   Title Pt will amb. 500' over even/uneven terrain with LRAD at MOD I level to improve functional mobility. Target date: 01/10/16   Status Partially Met   PT SHORT TERM GOAL #8   Title Pt will ascend/descend curb with LRAD at MOD I level to improve functional mobility. Target date: 01/10/16   Status Partially Met   PT SHORT TERM GOAL #9   TITLE Pt will ascend/descend 12 steps with one handrail at MOD I level to improve functional mobility.  Target date: 01/10/16   Status Achieved   PT SHORT TERM GOAL #10   TITLE Pt will incr. FGA score to >/=20/24 to decr. falls risk. Target date: 03/10/16   Status Revised   PT SHORT TERM GOAL #11   TITLE Pt will dance for 5 minutes with S and no LOB  to dance with family. Target date: 03/10/16   Status New   PT SHORT TERM GOAL #12   TITLE Pt will jump x5 reps with S to play with children at home. Target date: 03/10/16   Status New   PT SHORT TERM GOAL #13   TITLE Pt will transfer into bed with use of stool (height 35") with S to improve functional mobility at home. Target date: 03/10/16   Status New           PT Long Term Goals - 02/13/16 1114    PT LONG TERM GOAL #1   Title Pt will ambulate 1000' over even/uneven terrain, with LRAD, at MOD I level to improve functional mobiliity. Target date: 02/07/16/17.   Baseline All unmet LTGs will be carried over to new 8 week POC: 04/11/16   Status On-going   PT LONG TERM GOAL #2   Title Pt will amb. 300' over even terrain without an AD, IND, to safely amb. in home. Target date: 04/11/16   Status On-going   PT LONG TERM GOAL #3   Title Pt will improve gait speed to >/=2.8f/sec, with LRAD, to amb. safely in the community. Target date: 11/12/14.   Status Achieved   PT LONG TERM GOAL #4   Title Pt will verbalize plans to join a fitness center upon d/c from PT to continue to maintain strength and endurance gains made during PT. Target date: 04/11/16.   Status On-going   PT LONG TERM GOAL #5   Title Pt improve BERG score to >/=55/56 to decr. falls risk. Target date: 12/11/15   Status Achieved   PT LONG TERM GOAL #6   Title Pt will ascend/descend curb with LRAD at MOD I level to improve functional mobility. Target date: 02/07/16   Status Achieved   PT LONG TERM GOAL #7   Title Pt will ascend/descend 12 steps no handrail at MOD I level, while carrying 20 lbs.l to improve functional mobility and carry child at home. Target date: 04/11/16   Status Revised    PT LONG TERM GOAL #8   Title Pt will ascend/descend ladder at MOD I level  in order to transfer in/out of water. Target date: 04/11/16   Status On-going   PT LONG TERM GOAL  #9   TITLE Pt will be able to transfer on/off boat at MOD I level, and no LOB, in order improve functional mobility and participate in family activities. Target date: 04/11/16   Status On-going   PT LONG TERM GOAL  #10   TITLE Pt will be IIND in progressed HEP in order to improve balance, strength and flexibility. Target date: 02/07/16   Status Achieved   PT LONG TERM GOAL  #11   TITLE Pt will improve DGI score to >/=20/24 to decr. falls risk. Target date: 02/07/16   Status Achieved   PT LONG TERM GOAL  #12   TITLE Pt will improve FGA score to >/=25/30 to decr. falls risk. Target date: 04/11/16   Status New               Plan - 02/13/16 1113    Clinical Impression Statement Pt met HEP LTG. Pt demonstrated improved strength as she tolerated progressed strengthening HEP. Pt tolerated well but did require cues for technique. Continue with POC. PT will update goals today.   Rehab Potential Good   Clinical Impairments Affecting Rehab Potential Seizures which may limit intensity of PT   PT Frequency 2x / week   PT Duration 8 weeks   PT Treatment/Interventions ADLs/Self Care Home Management;Neuromuscular re-education;Cognitive remediation;Biofeedback;DME Instruction;Gait training;Stair training;Canalith Repostioning;Patient/family education;Orthotic Fit/Training;Balance training;Therapeutic exercise;Manual techniques;Therapeutic activities;Vestibular   PT Next Visit Plan Gait training without AD   PT Home Exercise Plan Stretching/strength/balance HEP   Consulted and Agree with Plan of Care Patient      Patient will benefit from skilled therapeutic intervention in order to improve the following deficits and impairments:  Abnormal gait, Decreased endurance, Impaired sensation, Decreased knowledge of precautions,  Decreased activity tolerance, Decreased knowledge of use of DME, Decreased strength, Impaired UE functional use, Impaired tone, Decreased balance, Decreased mobility, Decreased cognition, Decreased range of motion, Decreased safety awareness, Decreased coordination, Impaired flexibility, Postural dysfunction  Visit Diagnosis: Other abnormalities of gait and mobility  Spastic hemiplegia affecting right dominant side ([place])     Problem List Patient Active Problem List   Diagnosis Date Noted  . Spastic hemiplegia and hemiparesis affecting dominant side ([place]) 09/13/2015  . Adhesive capsulitis of right shoulder 08/24/2015  . Symptomatic partial epilepsy with simple partial seizures ([place]) 08/21/2015  . Nontraumatic cortical hemorrhage of cerebral hemisphere ([place])   . Seizure ([place])   . Seizures ([place])   . Adjustment disorder with depressed mood   . Contracture of muscle ankle and foot 06/11/2015  . Hemiplga fol ntrm intcrbl hemor aff right dominant side 06/06/2015  . Left-sided intracerebral hemorrhage ([place]) 06/04/2015  . Seizure disorder as sequela of cerebrovascular accident ([place])   . Seizure disorder ([place]) 06/03/2015  . Sepsis ([place]) 06/03/2015  . UTI (urinary tract infection) 06/03/2015  . Hypotension 06/02/2015  . Right spastic hemiparesis ([place]) 05/28/2015  . 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 History of anxiety disorder 05/28/2015  . ICH (intracerebral hemorrhage) ([place])   . Seizure disorder, nonconvulsive, with status epilepticus ([place])   . Cerebral venous thrombosis of cortical vein   . Cytotoxic cerebral edema ([place])   . IVH (intraventricular hemorrhage) ([place])   . Term pregnancy 05/07/2015  . Spontaneous vaginal delivery 05/07/2015    Davine Sweney L 02/13/2016, 11:22 AM  [facility] [address] [place], [place], [address] Phone:  [phone]   Fax:  [phone]  Name: Jehan Ranganathan MRN: [id number] Date of Birth: [birth year]   Geoffry Paradise, PT,DPT 02/13/2016 11:22 AM Phone: [phone] Fax: [phone]

## 2016-02-12 NOTE — Telephone Encounter (Signed)
Spoke to him and adressed his concerns on 02/08/16

## 2016-02-14 ENCOUNTER — Encounter: Payer: Self-pay | Admitting: Occupational Therapy

## 2016-02-14 ENCOUNTER — Ambulatory Visit: Payer: BLUE CROSS/BLUE SHIELD | Admitting: Occupational Therapy

## 2016-02-14 ENCOUNTER — Ambulatory Visit: Payer: BLUE CROSS/BLUE SHIELD

## 2016-02-14 ENCOUNTER — Ambulatory Visit (INDEPENDENT_AMBULATORY_CARE_PROVIDER_SITE_OTHER): Payer: BLUE CROSS/BLUE SHIELD | Admitting: Psychology

## 2016-02-14 DIAGNOSIS — F4323 Adjustment disorder with mixed anxiety and depressed mood: Secondary | ICD-10-CM

## 2016-02-14 DIAGNOSIS — R2689 Other abnormalities of gait and mobility: Secondary | ICD-10-CM

## 2016-02-14 DIAGNOSIS — R293 Abnormal posture: Secondary | ICD-10-CM

## 2016-02-14 DIAGNOSIS — R208 Other disturbances of skin sensation: Secondary | ICD-10-CM

## 2016-02-14 DIAGNOSIS — R41844 Frontal lobe and executive function deficit: Secondary | ICD-10-CM

## 2016-02-14 DIAGNOSIS — G8111 Spastic hemiplegia affecting right dominant side: Secondary | ICD-10-CM

## 2016-02-14 DIAGNOSIS — M25611 Stiffness of right shoulder, not elsewhere classified: Secondary | ICD-10-CM

## 2016-02-14 NOTE — Therapy (Signed)
[facility] [address] [place], [place], [address] Phone: [phone]   Fax:  [phone]  Occupational Therapy Treatment  Patient Details  Name: Ashley [name] MRN: [id number] Date of Birth: 1975-12-20 No Data Recorded  Encounter Date: 02/14/2016      OT End of Session - 02/14/16 1244    Visit Number 32   Number of Visits 64   Date for OT Re-Evaluation 04/08/16   Authorization Type BCBS   Authorization Time Period 30 visit COMBINED between PT/OT;  pt has utilized benefit and is now self pay.    OT Start Time 1016   OT Stop Time 1101   OT Time Calculation (min) 45 min   Activity Tolerance Patient tolerated treatment well      Past Medical History  Diagnosis Date  . Anxiety   . ICH (intracerebral hemorrhage) ([place])   . Stroke ([place])   . Seizures [facility])     Past Surgical History  Procedure Laterality Date  . Dilation and curettage of uterus      There were no vitals filed for this visit.      Subjective Assessment - 02/14/16 1018    Subjective  I have carried the baby from the living room to the kitchen a few times.  I don't do anything else when I do it and I go very slow.    Pertinent History see epic snapshot; pt with [facility] 2 weeks after deliverying her 3rd child   Patient Stated Goals to be able to use my R arm normally   Currently in Pain? No/denies                      OT Treatments/Exercises (OP) - 02/14/16 0001    Neurological Re-education Exercises   Other Exercises 1 Neuro re ed to address trunk control, scapular control and strength with overhead reach Also addresed simulation of carrying baby, then carrying baby and carrying a bag of groceries then carrying baby and picking up things from low surfaces.                   OT Short Term Goals - 02/14/16 1240    OT SHORT TERM GOAL  #11   TITLE Pt will be mod I with upgraded HEP - 01/08/2016   Status Achieved   OT SHORT  TERM GOAL  #12   TITLE Pt will be able to pick up 8 pound object x 4 trials bilaterally to place on overhead shelf requiring at least 120* of shoulder flexion (for household mgmt tasks, grocery shopping ,etc)   Status Achieved   OT SHORT TERM GOAL  #13   TITLE Pt will be able to lift lightweight object unilaterally with RUE to 120* of shoulder flexion (baseline 115*)   Status Achieved  able to lift 4 pound object to this height with very minimal compensations   OT SHORT TERM GOAL  #14   TITLE Pt will be able to lift baby into and out of tub while in kneeling from and to baby seat using both UE's - 03/11/2016   Status On-going  Pt sat on gma's lap and pt able to lift baby from gma's lap into tub and then lift baby out of tub and hand to gma            OT Long Term Goals - 02/14/16 1241    OT LONG TERM GOAL #1   Title Pt will demonstrate ability  to carry 18 pound object for at least 600 feet during functional activity   Status On-going   OT LONG TERM GOAL #2   Title Pt will be mod I with sweeping kitchen floor using both UE's   Status On-going   OT LONG TERM GOAL #3   Title Pt will be mod I stepping over ledge of baby gate holding items in her hands   Status On-going   OT LONG TERM GOAL #4   Title Pt will demonstrate abilty to turn wheel of care functional radius using both hands   Status On-going   OT LONG TERM GOAL #5   Title Pt wil demonstrate ability to carry 10 pound object in RUE up/down stairs using railing   Status On-going   OT LONG TERM GOAL #7   Status Partially Met   OT LONG TERM GOAL  #10   TITLE Pt will be mod I with upgraded HEP - 02/12/2016 (date adjusted as pt did not attend therapy last week)   Status Achieved   OT LONG TERM GOAL  #11   TITLE Pt will be able to pick up 5 pound object with RUE to place on overhead shelf x4 trials that requres at least 120* of shoulder flexion (for home mgmt tasks, care of child, volunteering)   Status Achieved   OT LONG TERM GOAL   #12   TITLE Pt will be able to carry at least a 15 pound object using both hands without drops for 20 feet.   Status Achieved  145 feet carrying box with 15 pound weight   OT LONG TERM GOAL  #13   TITLE Pt will be able to get off toilet without using UE's (to allow her to use public restrooms, boat bathroom that are without grab bars)   Status Achieved               Plan - 02/14/16 1241    Clinical Impression Statement Pt progressing toward new goals.  Pt with improving balance and ability to use RUE functionally with more strenous tasks.    Rehab Potential Good   Clinical Impairments Affecting Rehab Potential cognition, impaired sensation and anxiety   OT Frequency 2x / week   OT Duration 8 weeks   OT Treatment/Interventions Self-care/ADL training;Therapeutic exercise;Patient/family education;Balance training;Ultrasound;Neuromuscular education;Manual Therapy;Splinting;Therapeutic exercises;Energy conservation;Parrafin;DME and/or AE instruction;Therapeutic activities;Cognitive remediation/compensation;Gait Training;Fluidtherapy;Electrical Stimulation;Moist Heat;Contrast Bath;Passive range of motion;Visual/perceptual remediation/compensation   Plan NMR for trunk control, overhead reach with strength, carrying items, balance and transitonal movements.   Consulted and Agree with Plan of Care Patient      Patient will benefit from skilled therapeutic intervention in order to improve the following deficits and impairments:  Abnormal gait, Decreased coordination, Decreased range of motion, Difficulty walking, Impaired flexibility, Decreased endurance, Decreased safety awareness, Increased edema, Impaired tone, Decreased knowledge of precautions, Decreased activity tolerance, Decreased balance, Decreased knowledge of use of DME, Pain, Impaired UE functional use, Impaired vision/preception, Decreased cognition, Decreased mobility, Decreased strength  Visit Diagnosis: Spastic hemiplegia  affecting right dominant side (HCC)  Stiffness of right shoulder, not elsewhere classified  Abnormal posture  Other abnormalities of gait and mobility  Frontal lobe and executive function deficit  Other disturbances of skin sensation    Problem List Patient Active Problem List   Diagnosis Date Noted  . Spastic hemiplegia and hemiparesis affecting dominant side ([place]) 09/13/2015  . Adhesive capsulitis of right shoulder 08/24/2015  . Symptomatic partial epilepsy with simple partial seizures ([place]) 08/21/2015  . Nontraumatic  cortical hemorrhage of cerebral hemisphere ([place])   . Seizure ([place])   . Seizures ([place])   . Adjustment disorder with depressed mood   . Contracture of muscle ankle and foot 06/11/2015  . Hemiplga fol ntrm intcrbl hemor aff right dominant side 06/06/2015  . Left-sided intracerebral hemorrhage ([place]) 06/04/2015  . Seizure disorder as sequela of cerebrovascular accident ([place])   . Seizure disorder ([place]) 06/03/2015  . Sepsis ([place]) 06/03/2015  . UTI (urinary tract infection) 06/03/2015  . Hypotension 06/02/2015  . Right spastic hemiparesis ([place]) 05/28/2015  . 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 History of anxiety disorder 05/28/2015  . ICH (intracerebral hemorrhage) ([place])   . Seizure disorder, nonconvulsive, with status epilepticus ([place])   . Cerebral venous thrombosis of cortical vein   . Cytotoxic cerebral edema ([place])   . IVH (intraventricular hemorrhage) ([place])   . Term pregnancy 05/07/2015  . Spontaneous vaginal delivery 05/07/2015    Quay Burow, OTR/L 02/14/2016, 12:45 PM  [facility] [address] [place], [place], [address] Phone: [phone]   Fax:  [phone]  Name: Ashley [name] MRN: [id number] Date of Birth: [birth year]

## 2016-02-14 NOTE — Therapy (Signed)
[facility] [address] [place], [place], [address] Phone: [phone]   Fax:  [phone]  Physical Therapy Treatment  Patient Details  Name: Ashley [name] MRN: [id number] Date of Birth: 13-Apr-1976 No Data Recorded  Encounter Date: 02/14/2016      PT End of Session - 02/14/16 5366    Visit Number 29   Number of Visits 21   Date for PT Re-Evaluation 04/11/16   Authorization Type BCBS. Per Laverda Page, visits now 30 for PT and OT total (15 per OT and 15 per PT). Pt wishes to continue at self pay after visit limit met.    Authorization - Visit Number 32   Authorization - Number of Visits 16   PT Start Time 0930   PT Stop Time 1014   PT Time Calculation (min) 44 min   Equipment Utilized During Treatment Gait belt   Activity Tolerance Patient tolerated treatment well   Behavior During Therapy WFL for tasks assessed/performed      Past Medical History  Diagnosis Date  . Anxiety   . ICH (intracerebral hemorrhage) ([place])   . Stroke ([place])   . Seizures [facility])     Past Surgical History  Procedure Laterality Date  . Dilation and curettage of uterus      There were no vitals filed for this visit.      Subjective Assessment - 02/14/16 0932    Subjective Pt denied falls or changes since last visit. Pt forgot to bring HEP.   Pertinent History Seizures, low BP   Patient Stated Goals "Be as close to normal as much as possible and to go back to running" Get back to taking care of my kids, 55 weeks old and 33 and 14 years old   Currently in Pain? No/denies                         [facility] Adult PT Treatment/Exercise - 02/14/16 0933    Ambulation/Gait   Ambulation/Gait Yes   Ambulation/Gait Assistance 5: Supervision;6: Modified independent (Device/Increase time)   Ambulation/Gait Assistance Details Cues to improve R hip protraction and to incr. RLE wt. shifting, performed with R AFO. Pt also performed BWSTT:   3x6 minutes, with 1-2 minute warm-up prior to each bout. PT provided intermittent manual facilitation to improve R heel strike and knee flexion, pt progressed to no assist. BWSTT and overground amb. performed without R AFO donned.   Ambulation Distance (Feet) 230 Feet  with R AFO and 230' p BWSTT no AFO, 267mand 2042m   Assistive device None;Body weight support system   Gait Pattern Decreased stance time - right;Decreased weight shift to right;Right foot flat;Decreased hip/knee flexion - right;Step-through pattern;Decreased step length - left;Trunk rotated posteriorly on right   Ambulation Surface Level;Indoor                 PT Short Term Goals - 02/14/16 1243    PT SHORT TERM GOAL #1   Title Pt will be IND in HEP to improve strength, balance, and endurance. Target date: 01/10/16.   Status Achieved   PT SHORT TERM GOAL #2   Title Pt will improve gait speed to >/=1.39f67fec to decrease falls risk. Target date: 07/27/15.   Status Achieved   PT SHORT TERM GOAL #3   Title Pt will ambulate 300' with LRAD, over even terrain, at MOD I level to improve functional mobility. Target date: 07/27/15.   Status Achieved  PT SHORT TERM GOAL #4   Title Pt will perform TUG in </=13.5 seconds with LRAD to decr. falls risk. Target date: 01/10/15.   Status Achieved   PT SHORT TERM GOAL #5   Title Perform BERG and write STG and LTG as appropriate. Target date: 07/27/15.   Status Achieved   PT SHORT TERM GOAL #6   Title Pt will improve BERG to >/=51/56 to decr. falls risk. Target date: 07/27/15.   Status Achieved   PT SHORT TERM GOAL #7   Title Pt will amb. 500' over even/uneven terrain with LRAD at MOD I level to improve functional mobility. Target date: 01/10/16   Status On-going   PT SHORT TERM GOAL #8   Title Pt will ascend/descend curb with LRAD at MOD I level to improve functional mobility. Target date: 01/10/16   Status On-going   PT SHORT TERM GOAL #9   TITLE Pt will ascend/descend 12  steps with one handrail at MOD I level to improve functional mobility. Target date: 01/10/16   Status Achieved   PT SHORT TERM GOAL #10   TITLE Pt will incr. FGA score to >/=20/24 to decr. falls risk. Target date: 03/10/16   Status On-going   PT SHORT TERM GOAL #11   TITLE Pt will dance for 5 minutes with S and no LOB  to dance with family. Target date: 03/10/16   Status On-going   PT SHORT TERM GOAL #12   TITLE Pt will jump x5 reps with S to play with children at home. Target date: 03/10/16   Status On-going   PT SHORT TERM GOAL #13   TITLE Pt will transfer into bed with use of stool (height 35") with S to improve functional mobility at home. Target date: 03/10/16   Status On-going           PT Long Term Goals - 02/14/16 1244    PT LONG TERM GOAL #1   Title Pt will ambulate 1000' over even/uneven terrain, with LRAD, at MOD I level to improve functional mobiliity. Target date: 02/07/16/17.   Baseline All unmet LTGs will be carried over to new 8 week POC: 04/11/16   Status On-going   PT LONG TERM GOAL #2   Title Pt will amb. 300' over even terrain without an AD, IND, to safely amb. in home. Target date: 04/11/16   Status On-going   PT LONG TERM GOAL #3   Title Pt will improve gait speed to >/=2.19f/sec, with LRAD, to amb. safely in the community. Target date: 11/12/14.   Status Achieved   PT LONG TERM GOAL #4   Title Pt will verbalize plans to join a fitness center upon d/c from PT to continue to maintain strength and endurance gains made during PT. Target date: 04/11/16.   Status On-going   PT LONG TERM GOAL #5   Title Pt improve BERG score to >/=55/56 to decr. falls risk. Target date: 12/11/15   Status Achieved   PT LONG TERM GOAL #6   Title Pt will ascend/descend curb with LRAD at MOD I level to improve functional mobility. Target date: 02/07/16   Status Achieved   PT LONG TERM GOAL #7   Title Pt will ascend/descend 12 steps no handrail at MOD I level, while carrying 20 lbs.l to  improve functional mobility and carry child at home. Target date: 04/11/16   Status On-going   PT LONG TERM GOAL #8   Title Pt will ascend/descend ladder at MOD I level in  order to transfer in/out of water. Target date: 04/11/16   Status On-going   PT LONG TERM GOAL  #9   TITLE Pt will be able to transfer on/off boat at MOD I level, and no LOB, in order improve functional mobility and participate in family activities. Target date: 04/11/16   Status On-going   PT LONG TERM GOAL  #10   TITLE Pt will be IIND in progressed HEP in order to improve balance, strength and flexibility. Target date: 02/07/16   Status Achieved   PT LONG TERM GOAL  #11   TITLE Pt will improve DGI score to >/=20/24 to decr. falls risk. Target date: 02/07/16   Status Achieved   PT LONG TERM GOAL  #12   TITLE Pt will improve FGA score to >/=25/30 to decr. falls risk. Target date: 04/11/16   Status On-going               Plan - 02/14/16 1241    Clinical Impression Statement Pt demonstrated progress, as she was able to amb. longer distance overground on BWSTT system without R AFO donned. Pt was also able to amb. at faster speeds during BWSTT without R AFO donned. Continue with POC.    Rehab Potential Good   Clinical Impairments Affecting Rehab Potential Seizures which may limit intensity of PT   PT Frequency 2x / week   PT Duration 8 weeks   PT Treatment/Interventions ADLs/Self Care Home Management;Neuromuscular re-education;Cognitive remediation;Biofeedback;DME Instruction;Gait training;Stair training;Canalith Repostioning;Patient/family education;Orthotic Fit/Training;Balance training;Therapeutic exercise;Manual techniques;Therapeutic activities;Vestibular   PT Next Visit Plan Gait training without AD, R SLS and jumping   PT Home Exercise Plan Stretching/strength/balance HEP   Consulted and Agree with Plan of Care Patient      Patient will benefit from skilled therapeutic intervention in order to improve the  following deficits and impairments:  Abnormal gait, Decreased endurance, Impaired sensation, Decreased knowledge of precautions, Decreased activity tolerance, Decreased knowledge of use of DME, Decreased strength, Impaired UE functional use, Impaired tone, Decreased balance, Decreased mobility, Decreased cognition, Decreased range of motion, Decreased safety awareness, Decreased coordination, Impaired flexibility, Postural dysfunction  Visit Diagnosis: Other abnormalities of gait and mobility     Problem List Patient Active Problem List   Diagnosis Date Noted  . Spastic hemiplegia and hemiparesis affecting dominant side ([place]) 09/13/2015  . Adhesive capsulitis of right shoulder 08/24/2015  . Symptomatic partial epilepsy with simple partial seizures ([place]) 08/21/2015  . Nontraumatic cortical hemorrhage of cerebral hemisphere ([place])   . Seizure ([place])   . Seizures ([place])   . Adjustment disorder with depressed mood   . Contracture of muscle ankle and foot 06/11/2015  . Hemiplga fol ntrm intcrbl hemor aff right dominant side 06/06/2015  . Left-sided intracerebral hemorrhage ([place]) 06/04/2015  . Seizure disorder as sequela of cerebrovascular accident ([place])   . Seizure disorder ([place]) 06/03/2015  . Sepsis ([place]) 06/03/2015  . UTI (urinary tract infection) 06/03/2015  . Hypotension 06/02/2015  . Right spastic hemiparesis ([place]) 05/28/2015  . 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 History of anxiety disorder 05/28/2015  . ICH (intracerebral hemorrhage) ([place])   . Seizure disorder, nonconvulsive, with status epilepticus ([place])   . Cerebral venous thrombosis of cortical vein   . Cytotoxic cerebral edema ([place])   . IVH (intraventricular hemorrhage) ([place])   . Term pregnancy 05/07/2015  . Spontaneous vaginal delivery 05/07/2015    Miller,Jennifer L 02/14/2016, 12:45 PM  [facility] [place],  [place], [address] Phone: [phone]   Fax:  [phone]  Name: Monike Bragdon MRN: [id number] Date of Birth: 03-Apr-1976    Geoffry Paradise, PT,DPT 02/14/2016 12:45 PM Phone: [phone] Fax: [phone]

## 2016-02-14 NOTE — Progress Notes (Signed)
[facility]  [address]   Telephone [phone] [address] Fax [phone] [place], [place] [address]   Psychology Progress Note   Name:  Ashley [name] Date of Birth:  Jun 27, 1976 MRN:  [id number]  Date:02/14/2016 (70m) psychotherapy [name] spoke about feeling sad that her stroke-related deficits have disrupted her ability to bond with her newborn like she did with her other children, frustration and embarrassment about her attentional and memory difficulties (as her self-awareness improves she has been noticing her cognitive errors more often), anxiety when faced with resuming certain activities (e.g., practicing driving) and difficulty coping with being dependent on others. No new problems reported. Level of depression seems stable. No report of suicidal ideas or hopelessness. Motivation for rehabilitation continues to be optimal. Mother (who is living with her since her discharge from hospital) and husband described as supportive though sometimes not empathetic enough..  Diagnostic Impressions Adjustment disorder with mixed anxiety and depressed mood [F43.23]  Interventions included support and reframing beliefs. Discussed possible ways to gradually increase independence.    Return on 02/26/16. She will invite her husband to join [place] as we discussed.   Jamey Ripa, Ph.D Licensed Psychologist

## 2016-02-15 ENCOUNTER — Other Ambulatory Visit: Payer: Self-pay | Admitting: Neurology

## 2016-02-19 ENCOUNTER — Ambulatory Visit: Payer: BLUE CROSS/BLUE SHIELD

## 2016-02-19 ENCOUNTER — Ambulatory Visit: Payer: BLUE CROSS/BLUE SHIELD | Admitting: Occupational Therapy

## 2016-02-19 ENCOUNTER — Encounter: Payer: Self-pay | Admitting: Occupational Therapy

## 2016-02-19 ENCOUNTER — Telehealth: Payer: Self-pay | Admitting: *Deleted

## 2016-02-19 DIAGNOSIS — R2689 Other abnormalities of gait and mobility: Secondary | ICD-10-CM | POA: Diagnosis not present

## 2016-02-19 DIAGNOSIS — G8111 Spastic hemiplegia affecting right dominant side: Secondary | ICD-10-CM

## 2016-02-19 DIAGNOSIS — M25611 Stiffness of right shoulder, not elsewhere classified: Secondary | ICD-10-CM

## 2016-02-19 DIAGNOSIS — R293 Abnormal posture: Secondary | ICD-10-CM

## 2016-02-19 DIAGNOSIS — R41844 Frontal lobe and executive function deficit: Secondary | ICD-10-CM

## 2016-02-19 DIAGNOSIS — R208 Other disturbances of skin sensation: Secondary | ICD-10-CM

## 2016-02-19 NOTE — Therapy (Signed)
[facility] [address] [place], [place], [address] Phone: [phone]   Fax:  [phone]  Physical Therapy Treatment  Patient Details  Name: Ashley [name] MRN: [id number] Date of Birth: 04/17/1976 No Data Recorded  Encounter Date: 02/19/2016      PT End of Session - 02/19/16 1209    Visit Number 60   Number of Visits 63   Date for PT Re-Evaluation 04/11/16   Authorization Type BCBS. Per Lisa Long, visits now 30 for PT and OT total (15 per OT and 15 per PT). Pt wishes to continue at self pay after visit limit met.    Authorization - Visit Number 33   Authorization - Number of Visits 16   PT Start Time 1017   PT Stop Time 1058   PT Time Calculation (min) 41 min   Activity Tolerance Patient tolerated treatment well   Behavior During Therapy WFL for tasks assessed/performed      Past Medical History  Diagnosis Date  . Anxiety   . ICH (intracerebral hemorrhage) (HCC)   . Stroke (HCC)   . Seizures (HCC)     Past Surgical History  Procedure Laterality Date  . Dilation and curettage of uterus      There were no vitals filed for this visit.      Subjective Assessment - 02/19/16 1020    Subjective Pt denied falls since last visit. pt reported R shoulder pain last OT session on Thursday but reported it has resolved.   Pertinent History Seizures, low BP   Patient Stated Goals "Be as close to normal as much as possible and to go back to running" Get back to taking care of my kids, 7 weeks old and 6 and 8 years old   Currently in Pain? No/denies             Therex: Pt performed former HEP and progressed HEP. PT streamlined HEP to most challenging and functional activities. Cues for technique. Please see pt instructions for details. Notes made on pt's current HEP, as she wished to keep her current papers.                     PT Education - 02/19/16 1208    Education provided Yes    Education Details Reviewed/progressed HEP as tolerated,as pt brought HEP to session.   Person(s) Educated Patient   Methods Explanation;Demonstration;Tactile cues;Verbal cues;Handout   Comprehension Returned demonstration;Verbalized understanding          PT Short Term Goals - 02/14/16 1243    PT SHORT TERM GOAL #1   Title Pt will be IND in HEP to improve strength, balance, and endurance. Target date: 01/10/16.   Status Achieved   PT SHORT TERM GOAL #2   Title Pt will improve gait speed to >/=1.8ft/sec to decrease falls risk. Target date: 07/27/15.   Status Achieved   PT SHORT TERM GOAL #3   Title Pt will ambulate 300' with LRAD, over even terrain, at MOD I level to improve functional mobility. Target date: 07/27/15.   Status Achieved   PT SHORT TERM GOAL #4   Title Pt will perform TUG in </=13.5 seconds with LRAD to decr. falls risk. Target date: 01/10/15.   Status Achieved   PT SHORT TERM GOAL #5   Title Perform BERG and write STG and LTG as appropriate. Target date: 07/27/15.   Status Achieved   PT SHORT TERM GOAL #6   Title Pt will   improve BERG to >/=51/56 to decr. falls risk. Target date: 07/27/15.   Status Achieved   PT SHORT TERM GOAL #7   Title Pt will amb. 500' over even/uneven terrain with LRAD at MOD I level to improve functional mobility. Target date: 01/10/16   Status On-going   PT SHORT TERM GOAL #8   Title Pt will ascend/descend curb with LRAD at MOD I level to improve functional mobility. Target date: 01/10/16   Status On-going   PT SHORT TERM GOAL #9   TITLE Pt will ascend/descend 12 steps with one handrail at MOD I level to improve functional mobility. Target date: 01/10/16   Status Achieved   PT SHORT TERM GOAL #10   TITLE Pt will incr. FGA score to >/=20/24 to decr. falls risk. Target date: 03/10/16   Status On-going   PT SHORT TERM GOAL #11   TITLE Pt will dance for 5 minutes with S and no LOB  to dance with family. Target date: 03/10/16   Status On-going   PT  SHORT TERM GOAL #12   TITLE Pt will jump x5 reps with S to play with children at home. Target date: 03/10/16   Status On-going   PT SHORT TERM GOAL #13   TITLE Pt will transfer into bed with use of stool (height 35") with S to improve functional mobility at home. Target date: 03/10/16   Status On-going           PT Long Term Goals - 02/14/16 1244    PT LONG TERM GOAL #1   Title Pt will ambulate 1000' over even/uneven terrain, with LRAD, at MOD I level to improve functional mobiliity. Target date: 02/07/16/17.   Baseline All unmet LTGs will be carried over to new 8 week POC: 04/11/16   Status On-going   PT LONG TERM GOAL #2   Title Pt will amb. 300' over even terrain without an AD, IND, to safely amb. in home. Target date: 04/11/16   Status On-going   PT LONG TERM GOAL #3   Title Pt will improve gait speed to >/=2.62ft/sec, with LRAD, to amb. safely in the community. Target date: 11/12/14.   Status Achieved   PT LONG TERM GOAL #4   Title Pt will verbalize plans to join a fitness center upon d/c from PT to continue to maintain strength and endurance gains made during PT. Target date: 04/11/16.   Status On-going   PT LONG TERM GOAL #5   Title Pt improve BERG score to >/=55/56 to decr. falls risk. Target date: 12/11/15   Status Achieved   PT LONG TERM GOAL #6   Title Pt will ascend/descend curb with LRAD at MOD I level to improve functional mobility. Target date: 02/07/16   Status Achieved   PT LONG TERM GOAL #7   Title Pt will ascend/descend 12 steps no handrail at MOD I level, while carrying 20 lbs.l to improve functional mobility and carry child at home. Target date: 04/11/16   Status On-going   PT LONG TERM GOAL #8   Title Pt will ascend/descend ladder at MOD I level in order to transfer in/out of water. Target date: 04/11/16   Status On-going   PT LONG TERM GOAL  #9   TITLE Pt will be able to transfer on/off boat at MOD I level, and no LOB, in order improve functional mobility and  participate in family activities. Target date: 04/11/16   Status On-going   PT LONG TERM GOAL  #10     TITLE Pt will be IIND in progressed HEP in order to improve balance, strength and flexibility. Target date: 02/07/16   Status Achieved   PT LONG TERM GOAL  #11   TITLE Pt will improve DGI score to >/=20/24 to decr. falls risk. Target date: 02/07/16   Status Achieved   PT LONG TERM GOAL  #12   TITLE Pt will improve FGA score to >/=25/30 to decr. falls risk. Target date: 04/11/16   Status On-going               Plan - 02/19/16 1209    Clinical Impression Statement Pt demonstrated progress, as she was able to tolerate exercise progress demonstrating improved strength. Pt required cues to decr. R UE synergy patterns during challenging HEP activities. Continue with POC.    Rehab Potential Good   Clinical Impairments Affecting Rehab Potential Seizures which may limit intensity of PT   PT Frequency 2x / week   PT Duration 8 weeks   PT Treatment/Interventions ADLs/Self Care Home Management;Neuromuscular re-education;Cognitive remediation;Biofeedback;DME Instruction;Gait training;Stair training;Canalith Repostioning;Patient/family education;Orthotic Fit/Training;Balance training;Therapeutic exercise;Manual techniques;Therapeutic activities;Vestibular   PT Next Visit Plan Gait training without AD, R SLS and jumping, stairs   PT Home Exercise Plan Stretching/strength/balance HEP   Consulted and Agree with Plan of Care Patient      Patient will benefit from skilled therapeutic intervention in order to improve the following deficits and impairments:  Abnormal gait, Decreased endurance, Impaired sensation, Decreased knowledge of precautions, Decreased activity tolerance, Decreased knowledge of use of DME, Decreased strength, Impaired UE functional use, Impaired tone, Decreased balance, Decreased mobility, Decreased cognition, Decreased range of motion, Decreased safety awareness, Decreased  coordination, Impaired flexibility, Postural dysfunction  Visit Diagnosis: Spastic hemiplegia affecting right dominant side (HCC)  Other abnormalities of gait and mobility     Problem List Patient Active Problem List   Diagnosis Date Noted  . Spastic hemiplegia and hemiparesis affecting dominant side (HCC) 09/13/2015  . Adhesive capsulitis of right shoulder 08/24/2015  . Symptomatic partial epilepsy with simple partial seizures (HCC) 08/21/2015  . Nontraumatic cortical hemorrhage of cerebral hemisphere (HCC)   . Seizure (HCC)   . Seizures (HCC)   . Adjustment disorder with depressed mood   . Contracture of muscle ankle and foot 06/11/2015  . Hemiplga fol ntrm intcrbl hemor aff right dominant side 06/06/2015  . Left-sided intracerebral hemorrhage (HCC) 06/04/2015  . Seizure disorder as sequela of cerebrovascular accident (HCC)   . Seizure disorder (HCC) 06/03/2015  . Sepsis (HCC) 06/03/2015  . UTI (urinary tract infection) 06/03/2015  . Hypotension 06/02/2015  . Right spastic hemiparesis (HCC) 05/28/2015  . 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 History of anxiety disorder 05/28/2015  . ICH (intracerebral hemorrhage) (HCC)   . Seizure disorder, nonconvulsive, with status epilepticus (HCC)   . Cerebral venous thrombosis of cortical vein   . Cytotoxic cerebral edema (HCC)   . IVH (intraventricular hemorrhage) (HCC)   . Term pregnancy 05/07/2015  . Spontaneous vaginal delivery 05/07/2015    Miller,Jennifer L 02/19/2016, 12:12 PM   [facility] [address] [place], [place], [address] Phone: [phone]   Fax:  [phone]  Name: Addisson Hase MRN: [id number] Date of Birth: 03/18/1976    Jennifer Miller, PT,DPT 02/19/2016 12:12 PM Phone: [phone] Fax: [phone]   

## 2016-02-19 NOTE — Patient Instructions (Addendum)
Reviewed HEP:  1) Right ankle dorsiflexion (seated) 3 sets of 10 reps. Using yellow band when no band becomes easy. Perform daily. 2) Right ankle eversion (seated) 2-3 sets of 10 reps. Perform daily. 3) R piriformis stretch supine 2-3x30sec. Hold. Daily 4) B LTR stretch supine 2-3x30 sec.hold. Daily. 5) Gastroc stretch 2-3x30sec. Hold. Daily. 6) Hamstring stretch 2x30 sec. hold(performed in long sitting vs. Short sitting to decr. R ankle inversion). Daily. 7) Prone R hamstring curl with 3lb. Wt. x10 reps. (progessed from no wt. To 3lbs.). 3-4x/week. 8) Progressed from R side hip abd and supine hip abd to R clamshells. 3x10 reps, 3-4x/week. 9) R SLR in supine 3x15reps. 3-4 days/week. 10) Bridges (with and without hip abd/add) 3x10 reps. 3-4x/week. 11) Terminal knee ext with black band (performed 2 sessions ago).  12) Hip abd with squats (sidestepping) with yellow band. Performed 2 sessions ago. 13). Wall squat with 10 sec. hold (performed 2 sessions ago).  PT also had pt continue walking at counter.

## 2016-02-19 NOTE — Therapy (Signed)
[facility] [address] [place], [place], [address] Phone: [phone]   Fax:  [phone]  Occupational Therapy Treatment  Patient Details  Name: Ashley [name] MRN: [id number] Date of Birth: April 20, 1976 No Data Recorded  Encounter Date: 02/19/2016      OT End of Session - 02/19/16 1112    Visit Number 33   Number of Visits 47   Date for OT Re-Evaluation 04/08/16   Authorization Type BCBS   Authorization Time Period 30 visit COMBINED between PT/OT;  pt has utilized benefit and is now self pay.    OT Start Time 0930   OT Stop Time 1016   OT Time Calculation (min) 46 min   Activity Tolerance Patient tolerated treatment well      Past Medical History  Diagnosis Date  . Anxiety   . ICH (intracerebral hemorrhage) (HCC)   . Stroke (HCC)   . Seizures [facility])     Past Surgical History  Procedure Laterality Date  . Dilation and curettage of uterus      There were no vitals filed for this visit.      Subjective Assessment - 02/19/16 0943    Subjective  I carried the baby up the stairs with  my mom right next tome and holding on the railing!   Pertinent History see epic snapshot; pt with [facility] 2 weeks after deliverying her 3rd child   Patient Stated Goals to be able to use my R arm normally   Currently in Pain? No/denies  stiffness in RUE and some soreness over past few days from working so hard in therapy                       OT Treatments/Exercises (OP) - 02/19/16 0001    Neurological Re-education Exercises   Other Exercises 1 Neuro re ed to update and upgrade HEP for RUE and trunk.  After practice pt able to return demonstrate all activities and monitor movement patterns.  Program focuses on scapular stability and strength, RUE strenght and trunk control . See pt instruction for details.                 OT Education - 02/19/16 1108    Education provided Yes   Education Details Upgraded  HEP for RUE and trunk   Person(s) Educated Patient   Methods Explanation;Demonstration;Tactile cues;Verbal cues;Handout   Comprehension Verbalized understanding;Returned demonstration          OT Short Term Goals - 02/19/16 1109    OT SHORT TERM GOAL  #11   TITLE Pt will be mod I with upgraded HEP - 01/08/2016   Status Achieved   OT SHORT TERM GOAL  #12   TITLE Pt will be able to pick up 8 pound object x 4 trials bilaterally to place on overhead shelf requiring at least 120* of shoulder flexion (for household mgmt tasks, grocery shopping ,etc)   Status Achieved   OT SHORT TERM GOAL  #13   TITLE Pt will be able to lift lightweight object unilaterally with RUE to 120* of shoulder flexion (baseline 115*)   Status Achieved  able to lift 4 pound object to this height with very minimal compensations   OT SHORT TERM GOAL  #14   TITLE Pt will be able to lift baby into and out of tub while in kneeling from and to baby seat using both UE's - 03/11/2016   Status On-going  Pt sat on  gma's lap and pt able to lift baby from gma's lap into tub and then lift baby out of tub and hand to [place] - 02/19/16 [place] #1   Title Pt will demonstrate ability to carry 18 pound object for at least 600 feet during functional activity 04/08/2016   Status On-going   OT LONG TERM GOAL #2   Title Pt will be mod I with sweeping kitchen floor using both UE's   Status On-going   OT LONG TERM GOAL #3   Title Pt will be mod I stepping over ledge of baby gate holding items in her hands   Status On-going   OT LONG TERM GOAL #4   Title Pt will demonstrate abilty to turn wheel of care functional radius using both hands   Status On-going   OT LONG TERM GOAL #5   Title Pt wil demonstrate ability to carry 10 pound object in RUE up/down stairs using railing   Status On-going   OT LONG TERM GOAL #7   Status Partially Met   OT LONG TERM GOAL  #10   TITLE Pt will be mod I  with upgraded HEP - 02/12/2016 (date adjusted as pt did not attend therapy last week)   Status Achieved   OT LONG TERM GOAL  #11   TITLE Pt will be able to pick up 5 pound object with RUE to place on overhead shelf x4 trials that requres at least 120* of shoulder flexion (for home mgmt tasks, care of child, volunteering)   Status Achieved   OT LONG TERM GOAL  #12   TITLE Pt will be able to carry at least a 15 pound object using both hands without drops for 20 feet.   Status Achieved  145 feet carrying box with 15 pound weight   OT LONG TERM GOAL  #13   TITLE Pt will be able to get off toilet without using UE's (to allow her to use public restrooms, boat bathroom that are without grab bars)   Status Achieved               Plan - 02/19/16 1110    Clinical Impression Statement Pt progressing toward goals. Pt with improved confidence as welll as ability to structure activities at home to try in a safe manner.    Rehab Potential Good   Clinical Impairments Affecting Rehab Potential cognition, impaired sensation and anxiety   OT Frequency 2x / week   OT Duration 8 weeks   OT Treatment/Interventions Self-care/ADL training;Therapeutic exercise;Patient/family education;Balance training;Ultrasound;Neuromuscular education;Manual Therapy;Splinting;Therapeutic exercises;Energy conservation;Parrafin;DME and/or AE instruction;Therapeutic activities;Cognitive remediation/compensation;Gait Training;Fluidtherapy;Electrical Stimulation;Moist Heat;Contrast Bath;Passive range of motion;Visual/perceptual remediation/compensation   Plan have pt return demonstate new HEP, NMR for RUE and trunk, carrying items, balance and transitional movements.   Consulted and Agree with Plan of Care Patient      Patient will benefit from skilled therapeutic intervention in order to improve the following deficits and impairments:  Abnormal gait, Decreased coordination, Decreased range of motion, Difficulty walking, Impaired  flexibility, Decreased endurance, Decreased safety awareness, Increased edema, Impaired tone, Decreased knowledge of precautions, Decreased activity tolerance, Decreased balance, Decreased knowledge of use of DME, Pain, Impaired UE functional use, Impaired vision/preception, Decreased cognition, Decreased mobility, Decreased strength  Visit Diagnosis: Spastic hemiplegia affecting right dominant side (HCC)  Stiffness of right shoulder, not elsewhere classified  Abnormal posture  Other abnormalities  of gait and mobility  Frontal lobe and executive function deficit  Other disturbances of skin sensation    Problem List Patient Active Problem List   Diagnosis Date Noted  . Spastic hemiplegia and hemiparesis affecting dominant side ([place]) 09/13/2015  . Adhesive capsulitis of right shoulder 08/24/2015  . Symptomatic partial epilepsy with simple partial seizures ([place]) 08/21/2015  . Nontraumatic cortical hemorrhage of cerebral hemisphere ([place])   . Seizure ([place])   . Seizures ([place])   . Adjustment disorder with depressed mood   . Contracture of muscle ankle and foot 06/11/2015  . Hemiplga fol ntrm intcrbl hemor aff right dominant side 06/06/2015  . Left-sided intracerebral hemorrhage ([place]) 06/04/2015  . Seizure disorder as sequela of cerebrovascular accident ([place])   . Seizure disorder ([place]) 06/03/2015  . Sepsis ([place]) 06/03/2015  . UTI (urinary tract infection) 06/03/2015  . Hypotension 06/02/2015  . Right spastic hemiparesis ([place]) 05/28/2015  . 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 History of anxiety disorder 05/28/2015  . ICH (intracerebral hemorrhage) ([place])   . Seizure disorder, nonconvulsive, with status epilepticus ([place])   . Cerebral venous thrombosis of cortical vein   . Cytotoxic cerebral edema ([place])   . IVH (intraventricular hemorrhage) ([place])   . Term pregnancy 05/07/2015  . Spontaneous vaginal delivery 05/07/2015    Quay Burow,  OTR/L 02/19/2016, 11:13 AM  [facility] [address] [place], [place], [address] Phone: [phone]   Fax:  [phone]  Name: Ashley [name] MRN: [id number] Date of Birth: [birth year]

## 2016-02-19 NOTE — Telephone Encounter (Signed)
LVM for patient that, per Dr Leonie Man,  Carotid doppler study was normal and that TCD study shows no worrisome findings. Left name , number for questions.

## 2016-02-19 NOTE — Patient Instructions (Signed)
Arm Program for home (upgraded)  1. Planks - lay on your stomach on your floor with elbows bent and directly under your shoulders.  Push up into a plank (stay on your knees) and hold for 10 seconds.  Return to starting position.  Do 10-12.  As this gets easier you can increase your hold time (DO NOT INCREASE REPS!!).  Add hold time in increments of 3-5 seconds.  2. Sit up tall on a firm surface.  Hold a 3 pound dumbbell between your hands, palms facing each other.  Keeping elbows straight raise above your head.  Make sure you don't lean back and keep arms long! Hold for slow count of 5. Then return to starting position.  Do 20. DO NOT INCREASE YOUR REPS.  As this gets easier you can increase your hold time.   Keep these hold times for the next two weeks and then we will relook at this!  Keep doing your stretches.

## 2016-02-21 ENCOUNTER — Ambulatory Visit: Payer: BLUE CROSS/BLUE SHIELD

## 2016-02-21 ENCOUNTER — Ambulatory Visit: Payer: BLUE CROSS/BLUE SHIELD | Admitting: Occupational Therapy

## 2016-02-21 ENCOUNTER — Encounter: Payer: Self-pay | Admitting: Occupational Therapy

## 2016-02-21 DIAGNOSIS — R293 Abnormal posture: Secondary | ICD-10-CM

## 2016-02-21 DIAGNOSIS — R2689 Other abnormalities of gait and mobility: Secondary | ICD-10-CM | POA: Diagnosis not present

## 2016-02-21 DIAGNOSIS — G8111 Spastic hemiplegia affecting right dominant side: Secondary | ICD-10-CM

## 2016-02-21 DIAGNOSIS — R208 Other disturbances of skin sensation: Secondary | ICD-10-CM

## 2016-02-21 DIAGNOSIS — R41844 Frontal lobe and executive function deficit: Secondary | ICD-10-CM

## 2016-02-21 DIAGNOSIS — M25611 Stiffness of right shoulder, not elsewhere classified: Secondary | ICD-10-CM

## 2016-02-21 NOTE — Therapy (Signed)
[facility] [address] [place], [place], [address] Phone: [phone]   Fax:  [phone]  Occupational Therapy Treatment  Patient Details  Name: Ashley [name] MRN: [id number] Date of Birth: October 23, 1975 No Data Recorded  Encounter Date: 02/21/2016      OT End of Session - 02/21/16 1206    Visit Number 34   Number of Visits 75   Date for OT Re-Evaluation 04/08/16   Authorization Type BCBS   Authorization Time Period 30 visit COMBINED between PT/OT;  pt has utilized benefit and is now self pay.    OT Start Time 0930   OT Stop Time 1015   OT Time Calculation (min) 45 min   Activity Tolerance Patient tolerated treatment well      Past Medical History  Diagnosis Date  . Anxiety   . ICH (intracerebral hemorrhage) ([place])   . Stroke ([place])   . Seizures [facility])     Past Surgical History  Procedure Laterality Date  . Dilation and curettage of uterus      There were no vitals filed for this visit.      Subjective Assessment - 02/21/16 0934    Subjective  My arm aches a little I think because I am doing so much with it   Pertinent History see epic snapshot; pt with [facility] 2 weeks after deliverying her 3rd child   Patient Stated Goals to be able to use my R arm normally                      OT Treatments/Exercises (OP) - 02/21/16 0001    Neurological Re-education Exercises   Other Exercises 1 Neuro re ed to address RUE overhead reach with stability, strength and normalizing relationship between scapula and humeral head with overhead reach.  Also addressed driiving evaluation again as pt had many appropriate questions.              Balance Exercises - 02/21/16 1124    Balance Exercises: Standing   SLS Solid surface;5 reps;Eyes open;10 secs  no UE support.   Heel Raises Limitations In // bars: and 0-1 UE support pt performed x10 heel raises at slow speed and x20 reps quickly to improve power and  wt shifting. Cues to keep feet apart.   Toe Raise Limitations x10 B toe raises performed in // bars, cues to engage R DF muscles and to decr. trunk flexion.   Other Standing Exercises In // bars with no UE support, with min guard to S for safety, pt performed B SLS over 6" step with 5-10 sec.hold. Cues to improve glute med/max contractions and lateral wt. shifting to R LE. Pt also performed jumping x10 reps with B UE support, cues for technique and to keep feet apart.             OT Short Term Goals - 02/21/16 1205    OT SHORT TERM GOAL  #11   TITLE Pt will be mod I with upgraded HEP - 01/08/2016   Status Achieved   OT SHORT TERM GOAL  #12   TITLE Pt will be able to pick up 8 pound object x 4 trials bilaterally to place on overhead shelf requiring at least 120* of shoulder flexion (for household mgmt tasks, grocery shopping ,etc)   Status Achieved   OT SHORT TERM GOAL  #13   TITLE Pt will be able to lift lightweight object unilaterally with RUE to 120* of shoulder  flexion (baseline 115*)   Status Achieved  able to lift 4 pound object to this height with very minimal compensations   OT SHORT TERM GOAL  #14   TITLE Pt will be able to lift baby into and out of tub while in kneeling from and to baby seat using both UE's - 03/11/2016   Status Achieved  Pt sat on gma's lap and pt able to lift baby from gma's lap into tub and then lift baby out of tub and hand to gma            OT Long Term Goals - 02/21/16 1205    OT LONG TERM GOAL #1   Title Pt will demonstrate ability to carry 18 pound object for at least 600 feet during functional activity 04/08/2016   Status On-going   OT LONG TERM GOAL #2   Title Pt will be mod I with sweeping kitchen floor using both UE's   Status On-going   OT LONG TERM GOAL #3   Title Pt will be mod I stepping over ledge of baby gate holding items in her hands   Status On-going   OT LONG TERM GOAL #4   Title Pt will demonstrate abilty to turn wheel of care  functional radius using both hands   Status On-going   OT LONG TERM GOAL #5   Title Pt wil demonstrate ability to carry 10 pound object in RUE up/down stairs using railing   Status On-going   OT LONG TERM GOAL #7   Status Partially Met   OT LONG TERM GOAL  #10   TITLE Pt will be mod I with upgraded HEP - 02/12/2016 (date adjusted as pt did not attend therapy last week)   Status Achieved   OT LONG TERM GOAL  #11   TITLE Pt will be able to pick up 5 pound object with RUE to place on overhead shelf x4 trials that requres at least 120* of shoulder flexion (for home mgmt tasks, care of child, volunteering)   Status Achieved   OT LONG TERM GOAL  #12   TITLE Pt will be able to carry at least a 15 pound object using both hands without drops for 20 feet.   Status Achieved  145 feet carrying box with 15 pound weight   OT LONG TERM GOAL  #13   TITLE Pt will be able to get off toilet without using UE's (to allow her to use public restrooms, boat bathroom that are without grab bars)   Status Achieved               Plan - 02/21/16 1205    Clinical Impression Statement Pt progreassing toward goals. Pt now independent in bathing baby at home.    Rehab Potential Good   Clinical Impairments Affecting Rehab Potential cognition, impaired sensation and anxiety   OT Frequency 2x / week   OT Duration 8 weeks   OT Treatment/Interventions Self-care/ADL training;Therapeutic exercise;Patient/family education;Balance training;Ultrasound;Neuromuscular education;Manual Therapy;Splinting;Therapeutic exercises;Energy conservation;Parrafin;DME and/or AE instruction;Therapeutic activities;Cognitive remediation/compensation;Gait Training;Fluidtherapy;Electrical Stimulation;Moist Heat;Contrast Bath;Passive range of motion;Visual/perceptual remediation/compensation   Plan pt reports HEP going well, NMR for RUE and trunk, carrying items, balance and transitional movements   Consulted and Agree with Plan of Care  Patient      Patient will benefit from skilled therapeutic intervention in order to improve the following deficits and impairments:  Abnormal gait, Decreased coordination, Decreased range of motion, Difficulty walking, Impaired flexibility, Decreased endurance, Decreased safety awareness, Increased edema, Impaired  tone, Decreased knowledge of precautions, Decreased activity tolerance, Decreased balance, Decreased knowledge of use of DME, Pain, Impaired UE functional use, Impaired vision/preception, Decreased cognition, Decreased mobility, Decreased strength  Visit Diagnosis: Spastic hemiplegia affecting right dominant side (HCC)  Stiffness of right shoulder, not elsewhere classified  Abnormal posture  Frontal lobe and executive function deficit  Other disturbances of skin sensation  Other abnormalities of gait and mobility    Problem List Patient Active Problem List   Diagnosis Date Noted  . Spastic hemiplegia and hemiparesis affecting dominant side ([place]) 09/13/2015  . Adhesive capsulitis of right shoulder 08/24/2015  . Symptomatic partial epilepsy with simple partial seizures ([place]) 08/21/2015  . Nontraumatic cortical hemorrhage of cerebral hemisphere ([place])   . Seizure ([place])   . Seizures ([place])   . Adjustment disorder with depressed mood   . Contracture of muscle ankle and foot 06/11/2015  . Hemiplga fol ntrm intcrbl hemor aff right dominant side 06/06/2015  . Left-sided intracerebral hemorrhage ([place]) 06/04/2015  . Seizure disorder as sequela of cerebrovascular accident ([place])   . Seizure disorder ([place]) 06/03/2015  . Sepsis ([place]) 06/03/2015  . UTI (urinary tract infection) 06/03/2015  . Hypotension 06/02/2015  . Right spastic hemiparesis ([place]) 05/28/2015  . 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 History of anxiety disorder 05/28/2015  . ICH (intracerebral hemorrhage) ([place])   . Seizure disorder, nonconvulsive, with status epilepticus ([place])   .  Cerebral venous thrombosis of cortical vein   . Cytotoxic cerebral edema ([place])   . IVH (intraventricular hemorrhage) ([place])   . Term pregnancy 05/07/2015  . Spontaneous vaginal delivery 05/07/2015    Quay Burow, OTR/L 02/21/2016, 12:08 PM  [facility] [address] [place], [place], [address] Phone: [phone]   Fax:  [phone]  Name: Devaney Segers MRN: [id number] Date of Birth: Feb 01, 1976

## 2016-02-21 NOTE — Therapy (Signed)
[facility] [address] [place], [place], [address] Phone: [phone]   Fax:  [phone]  Physical Therapy Treatment  Patient Details  Name: Ashley [name] MRN: [id number] Date of Birth: 11/20/1975 No Data Recorded  Encounter Date: 02/21/2016      PT End of Session - 02/21/16 1128    Visit Number 78   Number of Visits 49   Date for PT Re-Evaluation 04/11/16   Authorization Type BCBS. Per Laverda Page, visits now 30 for PT and OT total (15 per OT and 15 per PT). Pt wishes to continue at self pay after visit limit met.    Authorization - Visit Number 34   Authorization - Number of Visits 16   PT Start Time 1017   PT Stop Time 1058   PT Time Calculation (min) 41 min   Equipment Utilized During Treatment Gait belt   Activity Tolerance Patient tolerated treatment well   Behavior During Therapy WFL for tasks assessed/performed      Past Medical History  Diagnosis Date  . Anxiety   . ICH (intracerebral hemorrhage) ([place])   . Stroke ([place])   . Seizures [facility])     Past Surgical History  Procedure Laterality Date  . Dilation and curettage of uterus      There were no vitals filed for this visit.      Subjective Assessment - 02/21/16 1018    Subjective Pt denied falls or changes since last visit.    Pertinent History Seizures, low BP   Patient Stated Goals "Be as close to normal as much as possible and to go back to running" Get back to taking care of my kids, 27 weeks old and 17 and 6 years old   Currently in Pain? No/denies                         [facility] Adult PT Treatment/Exercise - 02/21/16 1019    Ambulation/Gait   Ambulation/Gait Yes   Ambulation/Gait Assistance 5: Supervision;4: Min guard   Ambulation/Gait Assistance Details Cues to decr. R UE synergy pattern, stride length and heel strike. Min guard over uneven terrain without holding stuffed animal and even terrain while holding stuffed  animal. Cues to decr. gait speed during amb. over uneven terrain, to improve R LE stance time.   Ambulation Distance (Feet) 230 Feet  indoors and 1000' outdoors   Assistive device None   Gait Pattern Decreased stance time - right;Decreased weight shift to right;Right foot flat;Decreased hip/knee flexion - right;Step-through pattern;Decreased step length - left;Trunk rotated posteriorly on right   Ambulation Surface Level;Unlevel;Indoor;Outdoor;Paved   Stairs Yes   Stairs Assistance 4: Min guard;4: Min assist   Stairs Assistance Details (indicate cue type and reason) Cues to improve wt. shifting. Performed x4 steps without holding stuffed animal with wts. and 12 steps while holding 20lbs. stuffed animal.   Stair Management Technique No rails;One rail Right;Alternating pattern;Step to pattern;Forwards   Number of Stairs 16   Height of Stairs 6             Balance Exercises - 02/21/16 1124    Balance Exercises: Standing   SLS Solid surface;5 reps;Eyes open;10 secs  no UE support.   Heel Raises Limitations In // bars: and 0-1 UE support pt performed x10 heel raises at slow speed and x20 reps quickly to improve power and wt shifting. Cues to keep feet apart.   Toe Raise Limitations x10 B toe  raises performed in // bars, cues to engage R DF muscles and to decr. trunk flexion.   Other Standing Exercises In // bars with no UE support, with min guard to S for safety, pt performed B SLS over 6" step with 5-10 sec.hold. Cues to improve glute med/max contractions and lateral wt. shifting to R LE. Pt also performed jumping x10 reps with B UE support, cues for technique and to keep feet apart.           PT Education - 02/21/16 1127    Education provided Yes   Education Details PT instructed pt on proper lifting techinque, when picking up stuffed animal in order to reduce back strain.   Person(s) Educated Patient   Methods Explanation;Demonstration;Verbal cues   Comprehension Returned  demonstration;Verbalized understanding          PT Short Term Goals - 02/14/16 1243    PT SHORT TERM GOAL #1   Title Pt will be IND in HEP to improve strength, balance, and endurance. Target date: 01/10/16.   Status Achieved   PT SHORT TERM GOAL #2   Title Pt will improve gait speed to >/=1.57f/sec to decrease falls risk. Target date: 07/27/15.   Status Achieved   PT SHORT TERM GOAL #3   Title Pt will ambulate 300' with LRAD, over even terrain, at MOD I level to improve functional mobility. Target date: 07/27/15.   Status Achieved   PT SHORT TERM GOAL #4   Title Pt will perform TUG in </=13.5 seconds with LRAD to decr. falls risk. Target date: 01/10/15.   Status Achieved   PT SHORT TERM GOAL #5   Title Perform BERG and write STG and LTG as appropriate. Target date: 07/27/15.   Status Achieved   PT SHORT TERM GOAL #6   Title Pt will improve BERG to >/=51/56 to decr. falls risk. Target date: 07/27/15.   Status Achieved   PT SHORT TERM GOAL #7   Title Pt will amb. 500' over even/uneven terrain with LRAD at MOD I level to improve functional mobility. Target date: 01/10/16   Status On-going   PT SHORT TERM GOAL #8   Title Pt will ascend/descend curb with LRAD at MOD I level to improve functional mobility. Target date: 01/10/16   Status On-going   PT SHORT TERM GOAL #9   TITLE Pt will ascend/descend 12 steps with one handrail at MOD I level to improve functional mobility. Target date: 01/10/16   Status Achieved   PT SHORT TERM GOAL #10   TITLE Pt will incr. FGA score to >/=20/24 to decr. falls risk. Target date: 03/10/16   Status On-going   PT SHORT TERM GOAL #11   TITLE Pt will dance for 5 minutes with S and no LOB  to dance with family. Target date: 03/10/16   Status On-going   PT SHORT TERM GOAL #12   TITLE Pt will jump x5 reps with S to play with children at home. Target date: 03/10/16   Status On-going   PT SHORT TERM GOAL #13   TITLE Pt will transfer into bed with use of stool  (height 35") with S to improve functional mobility at home. Target date: 03/10/16   Status On-going           PT Long Term Goals - 02/14/16 1244    PT LONG TERM GOAL #1   Title Pt will ambulate 1000' over even/uneven terrain, with LRAD, at MOD I level to improve functional mobiliity. Target date:  02/07/16/17.   Baseline All unmet LTGs will be carried over to new 8 week POC: 04/11/16   Status On-going   PT LONG TERM GOAL #2   Title Pt will amb. 300' over even terrain without an AD, IND, to safely amb. in home. Target date: 04/11/16   Status On-going   PT LONG TERM GOAL #3   Title Pt will improve gait speed to >/=2.60f/sec, with LRAD, to amb. safely in the community. Target date: 11/12/14.   Status Achieved   PT LONG TERM GOAL #4   Title Pt will verbalize plans to join a fitness center upon d/c from PT to continue to maintain strength and endurance gains made during PT. Target date: 04/11/16.   Status On-going   PT LONG TERM GOAL #5   Title Pt improve BERG score to >/=55/56 to decr. falls risk. Target date: 12/11/15   Status Achieved   PT LONG TERM GOAL #6   Title Pt will ascend/descend curb with LRAD at MOD I level to improve functional mobility. Target date: 02/07/16   Status Achieved   PT LONG TERM GOAL #7   Title Pt will ascend/descend 12 steps no handrail at MOD I level, while carrying 20 lbs.l to improve functional mobility and carry child at home. Target date: 04/11/16   Status On-going   PT LONG TERM GOAL #8   Title Pt will ascend/descend ladder at MOD I level in order to transfer in/out of water. Target date: 04/11/16   Status On-going   PT LONG TERM GOAL  #9   TITLE Pt will be able to transfer on/off boat at MOD I level, and no LOB, in order improve functional mobility and participate in family activities. Target date: 04/11/16   Status On-going   PT LONG TERM GOAL  #10   TITLE Pt will be IIND in progressed HEP in order to improve balance, strength and flexibility. Target date:  02/07/16   Status Achieved   PT LONG TERM GOAL  #11   TITLE Pt will improve DGI score to >/=20/24 to decr. falls risk. Target date: 02/07/16   Status Achieved   PT LONG TERM GOAL  #12   TITLE Pt will improve FGA score to >/=25/30 to decr. falls risk. Target date: 04/11/16   Status On-going               Plan - 02/21/16 1128    Clinical Impression Statement Pt demonstrated progress, as she was able to amb. longer distances without AD. Pt was also able to traverse steps with less assist with and without holidng weighted stuffed animal. Continue with POC.   Rehab Potential Good   Clinical Impairments Affecting Rehab Potential Seizures which may limit intensity of PT   PT Frequency 2x / week   PT Duration 8 weeks   PT Treatment/Interventions ADLs/Self Care Home Management;Neuromuscular re-education;Cognitive remediation;Biofeedback;DME Instruction;Gait training;Stair training;Canalith Repostioning;Patient/family education;Orthotic Fit/Training;Balance training;Therapeutic exercise;Manual techniques;Therapeutic activities;Vestibular   PT Next Visit Plan Continue Gait training without AD, R SLS and jumping, stairs   PT Home Exercise Plan Stretching/strength/balance HEP   Consulted and Agree with Plan of Care Patient      Patient will benefit from skilled therapeutic intervention in order to improve the following deficits and impairments:  Abnormal gait, Decreased endurance, Impaired sensation, Decreased knowledge of precautions, Decreased activity tolerance, Decreased knowledge of use of DME, Decreased strength, Impaired UE functional use, Impaired tone, Decreased balance, Decreased mobility, Decreased cognition, Decreased range of motion, Decreased safety awareness, Decreased coordination,  Impaired flexibility, Postural dysfunction  Visit Diagnosis: Other abnormalities of gait and mobility  Spastic hemiplegia affecting right dominant side [facility])     Problem List Patient Active  Problem List   Diagnosis Date Noted  . Spastic hemiplegia and hemiparesis affecting dominant side ([place]) 09/13/2015  . Adhesive capsulitis of right shoulder 08/24/2015  . Symptomatic partial epilepsy with simple partial seizures ([place]) 08/21/2015  . Nontraumatic cortical hemorrhage of cerebral hemisphere ([place])   . Seizure ([place])   . Seizures ([place])   . Adjustment disorder with depressed mood   . Contracture of muscle ankle and foot 06/11/2015  . Hemiplga fol ntrm intcrbl hemor aff right dominant side 06/06/2015  . Left-sided intracerebral hemorrhage ([place]) 06/04/2015  . Seizure disorder as sequela of cerebrovascular accident ([place])   . Seizure disorder ([place]) 06/03/2015  . Sepsis ([place]) 06/03/2015  . UTI (urinary tract infection) 06/03/2015  . Hypotension 06/02/2015  . Right spastic hemiparesis ([place]) 05/28/2015  . 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 History of anxiety disorder 05/28/2015  . ICH (intracerebral hemorrhage) ([place])   . Seizure disorder, nonconvulsive, with status epilepticus ([place])   . Cerebral venous thrombosis of cortical vein   . Cytotoxic cerebral edema ([place])   . IVH (intraventricular hemorrhage) ([place])   . Term pregnancy 05/07/2015  . Spontaneous vaginal delivery 05/07/2015    Kylene Zamarron L 02/21/2016, 11:30 AM  [facility] [address] [place], [place], [address] Phone: [phone]   Fax:  [phone]  Name: Laterra Lubinski MRN: [id number] Date of Birth: Sep 30, 1976    Geoffry Paradise, PT,DPT 02/21/2016 11:31 AM Phone: [phone] Fax: [phone]

## 2016-02-25 ENCOUNTER — Ambulatory Visit: Payer: BLUE CROSS/BLUE SHIELD | Admitting: Occupational Therapy

## 2016-02-25 ENCOUNTER — Encounter: Payer: Self-pay | Admitting: Occupational Therapy

## 2016-02-25 DIAGNOSIS — R2689 Other abnormalities of gait and mobility: Secondary | ICD-10-CM

## 2016-02-25 DIAGNOSIS — R293 Abnormal posture: Secondary | ICD-10-CM

## 2016-02-25 DIAGNOSIS — G8111 Spastic hemiplegia affecting right dominant side: Secondary | ICD-10-CM

## 2016-02-25 DIAGNOSIS — R208 Other disturbances of skin sensation: Secondary | ICD-10-CM

## 2016-02-25 DIAGNOSIS — R41844 Frontal lobe and executive function deficit: Secondary | ICD-10-CM

## 2016-02-25 DIAGNOSIS — M25611 Stiffness of right shoulder, not elsewhere classified: Secondary | ICD-10-CM

## 2016-02-25 NOTE — Therapy (Signed)
[facility] [address] [place], [place], [address] Phone: [phone]   Fax:  [phone]  Occupational Therapy Treatment  Patient Details  Name: Ashley [name] MRN: [id number] Date of Birth: 1976/04/05 No Data Recorded  Encounter Date: 02/25/2016      OT End of Session - 02/25/16 1251    Visit Number 35   Number of Visits 60   Date for OT Re-Evaluation 04/08/16   Authorization Type BCBS   Authorization Time Period 30 visit COMBINED between PT/OT;  pt has utilized benefit and is now self pay.    OT Start Time 1102   OT Stop Time 1145   OT Time Calculation (min) 43 min   Activity Tolerance Patient tolerated treatment well      Past Medical History  Diagnosis Date  . Anxiety   . ICH (intracerebral hemorrhage) ([place])   . Stroke ([place])   . Seizures [facility])     Past Surgical History  Procedure Laterality Date  . Dilation and curettage of uterus      There were no vitals filed for this visit.      Subjective Assessment - 02/25/16 1109    Subjective  I drove here today with my mom supervising   Pertinent History see epic snapshot; pt with SAH 2 weeks after deliverying her 3rd child   Patient Stated Goals to be able to use my R arm normally   Currently in Pain? No/denies                      OT Treatments/Exercises (OP) - 02/25/16 0001    ADLs   ADL Comments Pt arrived today with baby and mom.  Treatment focused on carrying baby for distance walking, transitioning baby from side to side while walking, stepping over thresholds while holding baby and transitional movement with baby.  Pt needs min guard when stepping over thresholds but is extremely aware of safety. Pt only carries baby short distances at home on level surfaces with mom at close supervision.  Pt able to carry baby 400 feet today.                    OT Short Term Goals - 02/25/16 1247    OT SHORT TERM GOAL  #11   TITLE  Pt will be mod I with upgraded HEP - 01/08/2016   Status Achieved   OT SHORT TERM GOAL  #12   TITLE Pt will be able to pick up 8 pound object x 4 trials bilaterally to place on overhead shelf requiring at least 120* of shoulder flexion (for household mgmt tasks, grocery shopping ,etc)   Status Achieved   OT SHORT TERM GOAL  #13   TITLE Pt will be able to lift lightweight object unilaterally with RUE to 120* of shoulder flexion (baseline 115*)   Status Achieved  able to lift 4 pound object to this height with very minimal compensations   OT SHORT TERM GOAL  #14   TITLE Pt will be able to lift baby into and out of tub while in kneeling from and to baby seat using both UE's - 03/11/2016   Status Achieved  Pt sat on gma's lap and pt able to lift baby from gma's lap into tub and then lift baby out of tub and hand to [place] Term Goals - 02/25/16 1247    OT  LONG TERM GOAL #1   Title Pt will demonstrate ability to carry 18 pound object for at least 600 feet during functional activity 04/08/2016   Status On-going   OT LONG TERM GOAL #2   Title Pt will be mod I with sweeping kitchen floor using both UE's   Status On-going   OT LONG TERM GOAL #3   Title Pt will be mod I stepping over ledge of baby gate holding items in her hands   Status On-going   OT LONG TERM GOAL #4   Title Pt will demonstrate abilty to turn wheel of care functional radius using both hands   Status Achieved   OT LONG TERM GOAL #5   Title Pt wil demonstrate ability to carry 10 pound object in RUE up/down stairs using railing   Status On-going   OT LONG TERM GOAL  #12   Status --  145 feet carrying box with 15 pound weight               Plan - 02/25/16 1249    Clinical Impression Statement Pt continues to make excellent progress toward goals.  Pt beginning to assume more responsibilty for care of baby at home.   Rehab Potential Good   Clinical Impairments Affecting Rehab Potential cognition,  impaired sensation and anxiety   OT Frequency 2x / week   OT Duration 8 weeks   OT Treatment/Interventions Self-care/ADL training;Therapeutic exercise;Patient/family education;Balance training;Ultrasound;Neuromuscular education;Manual Therapy;Splinting;Therapeutic exercises;Energy conservation;Parrafin;DME and/or AE instruction;Therapeutic activities;Cognitive remediation/compensation;Gait Training;Fluidtherapy;Electrical Stimulation;Moist Heat;Contrast Bath;Passive range of motion;Visual/perceptual remediation/compensation   Plan NMR for RUE and trunk, carrying items, balance and transitional movements   Consulted and Agree with Plan of Care Patient;Family member/caregiver   Family Member Consulted mom      Patient will benefit from skilled therapeutic intervention in order to improve the following deficits and impairments:  Abnormal gait, Decreased coordination, Decreased range of motion, Difficulty walking, Impaired flexibility, Decreased endurance, Decreased safety awareness, Increased edema, Impaired tone, Decreased knowledge of precautions, Decreased activity tolerance, Decreased balance, Decreased knowledge of use of DME, Pain, Impaired UE functional use, Impaired vision/preception, Decreased cognition, Decreased mobility, Decreased strength  Visit Diagnosis: Spastic hemiplegia affecting right dominant side (HCC)  Stiffness of right shoulder, not elsewhere classified  Abnormal posture  Frontal lobe and executive function deficit  Other disturbances of skin sensation  Other abnormalities of gait and mobility    Problem List Patient Active Problem List   Diagnosis Date Noted  . Spastic hemiplegia and hemiparesis affecting dominant side ([place]) 09/13/2015  . Adhesive capsulitis of right shoulder 08/24/2015  . Symptomatic partial epilepsy with simple partial seizures ([place]) 08/21/2015  . Nontraumatic cortical hemorrhage of cerebral hemisphere ([place])   . Seizure ([place])   . Seizures  ([place])   . Adjustment disorder with depressed mood   . Contracture of muscle ankle and foot 06/11/2015  . Hemiplga fol ntrm intcrbl hemor aff right dominant side 06/06/2015  . Left-sided intracerebral hemorrhage ([place]) 06/04/2015  . Seizure disorder as sequela of cerebrovascular accident ([place])   . Seizure disorder ([place]) 06/03/2015  . Sepsis ([place]) 06/03/2015  . UTI (urinary tract infection) 06/03/2015  . Hypotension 06/02/2015  . Right spastic hemiparesis ([place]) 05/28/2015  . 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 History of anxiety disorder 05/28/2015  . ICH (intracerebral hemorrhage) ([place])   . Seizure disorder, nonconvulsive, with status epilepticus ([place])   . Cerebral venous thrombosis of cortical vein   . Cytotoxic cerebral edema ([place])   . IVH (  intraventricular hemorrhage) ([place])   . Term pregnancy 05/07/2015  . Spontaneous vaginal delivery 05/07/2015    Quay Burow, OTR/L 02/25/2016, 12:53 PM  [facility] [address] [place], [place], [address] Phone: [phone]   Fax:  [phone]  Name: Ashley [name] MRN: [id number] Date of Birth: 05/16/1976

## 2016-02-26 ENCOUNTER — Ambulatory Visit: Payer: BLUE CROSS/BLUE SHIELD

## 2016-02-26 ENCOUNTER — Ambulatory Visit: Payer: BLUE CROSS/BLUE SHIELD | Admitting: Occupational Therapy

## 2016-02-26 ENCOUNTER — Ambulatory Visit (INDEPENDENT_AMBULATORY_CARE_PROVIDER_SITE_OTHER): Payer: BLUE CROSS/BLUE SHIELD | Admitting: Psychology

## 2016-02-26 ENCOUNTER — Encounter: Payer: Self-pay | Admitting: Occupational Therapy

## 2016-02-26 DIAGNOSIS — R41844 Frontal lobe and executive function deficit: Secondary | ICD-10-CM

## 2016-02-26 DIAGNOSIS — R293 Abnormal posture: Secondary | ICD-10-CM

## 2016-02-26 DIAGNOSIS — G8111 Spastic hemiplegia affecting right dominant side: Secondary | ICD-10-CM

## 2016-02-26 DIAGNOSIS — F4323 Adjustment disorder with mixed anxiety and depressed mood: Secondary | ICD-10-CM | POA: Diagnosis not present

## 2016-02-26 DIAGNOSIS — M25611 Stiffness of right shoulder, not elsewhere classified: Secondary | ICD-10-CM

## 2016-02-26 DIAGNOSIS — R2689 Other abnormalities of gait and mobility: Secondary | ICD-10-CM

## 2016-02-26 DIAGNOSIS — R208 Other disturbances of skin sensation: Secondary | ICD-10-CM

## 2016-02-26 NOTE — Progress Notes (Signed)
[facility]  [address]   Telephone [phone] [address] Fax [phone] [place], [place] [address]   Psychology Progress Note   Name:  Ashley [name] Date of Birth:  Jan 06, 1976 MRN:  [id number]  Date:02/26/2016 (54m) psychotherapy Joint session with husband. Discussed effects of [name]'s outbursts of frustration and memory loss since her CVA on their relationship. Husband reported that she will become quickly angry (expressed only verbally) about something, usually when she perceives her independence being infringed on, followed by rapid return to typical mood. She typically cannot remember what set her off. Both agreed that her mother living with them since her stroke has been a double-edged sword as much of Ashley [name]'s frustration has occured in reaction to her mother seeming to usurp her role (similar pre-morbid relationship issues). Overall, husband expressed support and understanding.  Diagnostic Impression Adjustment disorder with mixed anxiety and depressed mood [F43.23  Dicussed common emotional reactions after stroke and effects on family.  Return in one week.   Jamey Ripa, Ph.D Licensed Psychologist

## 2016-02-26 NOTE — Therapy (Signed)
[facility] [address] [place], [place], [address] Phone: [phone]   Fax:  [phone]  Physical Therapy Treatment  Patient Details  Name: Ashley [name] MRN: [id number] Date of Birth: [birth year] No Data Recorded  Encounter Date: 02/26/2016      PT End of Session - 02/26/16 0940    Visit Number 62   Number of Visits 28   Date for PT Re-Evaluation 04/11/16   Authorization Type BCBS. Per Laverda Page, visits now 30 for PT and OT total (15 per OT and 15 per PT). Pt wishes to continue at self pay after visit limit met.    Authorization - Visit Number 35   Authorization - Number of Visits 16   PT Start Time 7628   PT Stop Time 0929   PT Time Calculation (min) 42 min   Equipment Utilized During Treatment --  BWSTT harness   Activity Tolerance Patient tolerated treatment well   Behavior During Therapy WFL for tasks assessed/performed      Past Medical History  Diagnosis Date  . Anxiety   . ICH (intracerebral hemorrhage) ([place])   . Stroke ([place])   . Seizures [facility])     Past Surgical History  Procedure Laterality Date  . Dilation and curettage of uterus      There were no vitals filed for this visit.      Subjective Assessment - 02/26/16 0851    Subjective Pt denied falls since last visit. Pt reported she began driving over the weekend (with L foot), pt drove to OT yesterday and brought, Eulas Post (baby), to treatment.   Pertinent History Seizures, low BP   Patient Stated Goals "Be as close to normal as much as possible and to go back to running" Get back to taking care of my kids, 2 weeks old and 39 and 31 years old   Currently in Pain? No/denies                         [facility] Adult PT Treatment/Exercise - 02/26/16 0905    Ambulation/Gait   Ambulation/Gait Yes   Ambulation/Gait Assistance 5: Supervision;4: Min guard   Ambulation/Gait Assistance Details Pt amb. with R foot up brace donned cues to  decr. R arm synergy, heel strike and upright posture. Pt also performed BWSTT for 6 minutes without AFO donned, manual assist and VC's to improve R terminal stance and heel strike, cues to also improve L step length during overground amb. after BWSTT.   Ambulation Distance (Feet) 230 Feet  c R foot up, BWSTT (0.94mles), 230' no AFO   Assistive device None;Body weight support system   Gait Pattern Decreased stance time - right;Decreased weight shift to right;Right foot flat;Decreased hip/knee flexion - right;Step-through pattern;Decreased step length - left;Trunk rotated posteriorly on right   Ambulation Surface Level;Indoor                PT Education - 02/26/16 0940    Education provided Yes   Education Details PT educated pt on R foot up brace, and how to donn/doff and utilize with shoes without laces.   Person(s) Educated Patient   Methods Explanation;Verbal cues;Handout   Comprehension Verbalized understanding;Returned demonstration          PT Short Term Goals - 02/14/16 1243    PT SHORT TERM GOAL #1   Title Pt will be IND in HEP to improve strength, balance, and endurance. Target date: 01/10/16.   Status  Achieved   PT SHORT TERM GOAL #2   Title Pt will improve gait speed to >/=1.28f/sec to decrease falls risk. Target date: 07/27/15.   Status Achieved   PT SHORT TERM GOAL #3   Title Pt will ambulate 300' with LRAD, over even terrain, at MOD I level to improve functional mobility. Target date: 07/27/15.   Status Achieved   PT SHORT TERM GOAL #4   Title Pt will perform TUG in </=13.5 seconds with LRAD to decr. falls risk. Target date: 01/10/15.   Status Achieved   PT SHORT TERM GOAL #5   Title Perform BERG and write STG and LTG as appropriate. Target date: 07/27/15.   Status Achieved   PT SHORT TERM GOAL #6   Title Pt will improve BERG to >/=51/56 to decr. falls risk. Target date: 07/27/15.   Status Achieved   PT SHORT TERM GOAL #7   Title Pt will amb. 500' over  even/uneven terrain with LRAD at MOD I level to improve functional mobility. Target date: 01/10/16   Status On-going   PT SHORT TERM GOAL #8   Title Pt will ascend/descend curb with LRAD at MOD I level to improve functional mobility. Target date: 01/10/16   Status On-going   PT SHORT TERM GOAL #9   TITLE Pt will ascend/descend 12 steps with one handrail at MOD I level to improve functional mobility. Target date: 01/10/16   Status Achieved   PT SHORT TERM GOAL #10   TITLE Pt will incr. FGA score to >/=20/24 to decr. falls risk. Target date: 03/10/16   Status On-going   PT SHORT TERM GOAL #11   TITLE Pt will dance for 5 minutes with S and no LOB  to dance with family. Target date: 03/10/16   Status On-going   PT SHORT TERM GOAL #12   TITLE Pt will jump x5 reps with S to play with children at home. Target date: 03/10/16   Status On-going   PT SHORT TERM GOAL #13   TITLE Pt will transfer into bed with use of stool (height 35") with S to improve functional mobility at home. Target date: 03/10/16   Status On-going           PT Long Term Goals - 02/14/16 1244    PT LONG TERM GOAL #1   Title Pt will ambulate 1000' over even/uneven terrain, with LRAD, at MOD I level to improve functional mobiliity. Target date: 02/07/16/17.   Baseline All unmet LTGs will be carried over to new 8 week POC: 04/11/16   Status On-going   PT LONG TERM GOAL #2   Title Pt will amb. 300' over even terrain without an AD, IND, to safely amb. in home. Target date: 04/11/16   Status On-going   PT LONG TERM GOAL #3   Title Pt will improve gait speed to >/=2.630fsec, with LRAD, to amb. safely in the community. Target date: 11/12/14.   Status Achieved   PT LONG TERM GOAL #4   Title Pt will verbalize plans to join a fitness center upon d/c from PT to continue to maintain strength and endurance gains made during PT. Target date: 04/11/16.   Status On-going   PT LONG TERM GOAL #5   Title Pt improve BERG score to >/=55/56 to decr.  falls risk. Target date: 12/11/15   Status Achieved   PT LONG TERM GOAL #6   Title Pt will ascend/descend curb with LRAD at MOD I level to improve functional mobility. Target date:  02/07/16   Status Achieved   PT LONG TERM GOAL #7   Title Pt will ascend/descend 12 steps no handrail at MOD I level, while carrying 20 lbs.l to improve functional mobility and carry child at home. Target date: 04/11/16   Status On-going   PT LONG TERM GOAL #8   Title Pt will ascend/descend ladder at MOD I level in order to transfer in/out of water. Target date: 04/11/16   Status On-going   PT LONG TERM GOAL  #9   TITLE Pt will be able to transfer on/off boat at MOD I level, and no LOB, in order improve functional mobility and participate in family activities. Target date: 04/11/16   Status On-going   PT LONG TERM GOAL  #10   TITLE Pt will be IIND in progressed HEP in order to improve balance, strength and flexibility. Target date: 02/07/16   Status Achieved   PT LONG TERM GOAL  #11   TITLE Pt will improve DGI score to >/=20/24 to decr. falls risk. Target date: 02/07/16   Status Achieved   PT LONG TERM GOAL  #12   TITLE Pt will improve FGA score to >/=25/30 to decr. falls risk. Target date: 04/11/16   Status On-going               Plan - 02/26/16 0941    Clinical Impression Statement Pt demonstrated progress as she was able to amb. with R foot up brace for short distances (<300') without overt LOB, pt continues to demonstrate incr. R foot inversion without R AFO but R foot up brace would be viable option for short distances and allow pt to wear shoes without laces (pt goal). Continue with POC.    Rehab Potential Good   Clinical Impairments Affecting Rehab Potential Seizures which may limit intensity of PT   PT Frequency 2x / week   PT Duration 8 weeks   PT Treatment/Interventions ADLs/Self Care Home Management;Neuromuscular re-education;Cognitive remediation;Biofeedback;DME Instruction;Gait training;Stair  training;Canalith Repostioning;Patient/family education;Orthotic Fit/Training;Balance training;Therapeutic exercise;Manual techniques;Therapeutic activities;Vestibular   PT Next Visit Plan Continue Gait training without AD, R SLS and jumping, stairs   PT Home Exercise Plan Stretching/strength/balance HEP   Consulted and Agree with Plan of Care Patient      Patient will benefit from skilled therapeutic intervention in order to improve the following deficits and impairments:  Abnormal gait, Decreased endurance, Impaired sensation, Decreased knowledge of precautions, Decreased activity tolerance, Decreased knowledge of use of DME, Decreased strength, Impaired UE functional use, Impaired tone, Decreased balance, Decreased mobility, Decreased cognition, Decreased range of motion, Decreased safety awareness, Decreased coordination, Impaired flexibility, Postural dysfunction  Visit Diagnosis: Other abnormalities of gait and mobility  Spastic hemiplegia affecting right dominant side [facility])     Problem List Patient Active Problem List   Diagnosis Date Noted  . Spastic hemiplegia and hemiparesis affecting dominant side ([place]) 09/13/2015  . Adhesive capsulitis of right shoulder 08/24/2015  . Symptomatic partial epilepsy with simple partial seizures ([place]) 08/21/2015  . Nontraumatic cortical hemorrhage of cerebral hemisphere ([place])   . Seizure ([place])   . Seizures ([place])   . Adjustment disorder with depressed mood   . Contracture of muscle ankle and foot 06/11/2015  . Hemiplga fol ntrm intcrbl hemor aff right dominant side 06/06/2015  . Left-sided intracerebral hemorrhage ([place]) 06/04/2015  . Seizure disorder as sequela of cerebrovascular accident ([place])   . Seizure disorder ([place]) 06/03/2015  . Sepsis ([place]) 06/03/2015  . UTI (urinary tract infection) 06/03/2015  . Hypotension 06/02/2015  .  Right spastic hemiparesis ([place]) 05/28/2015  . Aphasia following nontraumatic intracerebral hemorrhage 05/28/2015   . History of anxiety disorder 05/28/2015  . ICH (intracerebral hemorrhage) ([place])   . Seizure disorder, nonconvulsive, with status epilepticus ([place])   . Cerebral venous thrombosis of cortical vein   . Cytotoxic cerebral edema ([place])   . IVH (intraventricular hemorrhage) ([place])   . Term pregnancy 05/07/2015  . Spontaneous vaginal delivery 05/07/2015    Julie-Anne Torain L 02/26/2016, 10:48 AM  [facility] [address] [place], [place], [address] Phone: [phone]   Fax:  [phone]  Name: Ashley [name] MRN: [id number] Date of Birth: 09/11/1976    Geoffry Paradise, PT,DPT 02/26/2016 10:48 AM Phone: [phone] Fax: [phone]

## 2016-02-26 NOTE — Therapy (Signed)
[facility] [address] [place], [place], [address] Phone: [phone]   Fax:  [phone]  Occupational Therapy Treatment  Patient Details  Name: Ashley [name] MRN: [id number] Date of Birth: Mar 16, 1976 No Data Recorded  Encounter Date: 02/26/2016      OT End of Session - 02/26/16 1251    Visit Number 36   Number of Visits 66   Date for OT Re-Evaluation 04/08/16   Authorization Type BCBS   Authorization Time Period 30 visit COMBINED between PT/OT;  pt has utilized benefit and is now self pay.    OT Start Time [id number]   OT Stop Time 1016   OT Time Calculation (min) 45 min   Activity Tolerance Patient tolerated treatment well      Past Medical History  Diagnosis Date  . Anxiety   . ICH (intracerebral hemorrhage) ([place])   . Stroke ([place])   . Seizures [facility])     Past Surgical History  Procedure Laterality Date  . Dilation and curettage of uterus      There were no vitals filed for this visit.      Subjective Assessment - 02/26/16 0934    Subjective  I felt really good about what I could do last session   Pertinent History see epic snapshot; pt with SAH 2 weeks after deliverying her 3rd child   Patient Stated Goals to be able to use my R arm normally   Currently in Pain? No/denies                      OT Treatments/Exercises (OP) - 02/26/16 0001    ADLs   Functional Mobility Addressed functional mobility related to carrying two bags of groceries for 200 feet as well as problem solving how to navigate two steps form garage into house without railing. Pt able to place bags on "top step" and then steady on wall to climb steps and then pick up bags once on landing.  Pt to try and unload groceries by herself (with mom near by) next shopping trip.  Also discussed having mom leave pt alone for a few hours in the  morning as beginning trial for more independence.     Home Maintenance Practiced sweeping  and picking up debris with dustpan;  also had pt navigate heavy outside doors to dump dustpan. Pt able to manage without assistance.                   OT Short Term Goals - 02/26/16 1249    OT SHORT TERM GOAL  #11   TITLE Pt will be mod I with upgraded HEP - 01/08/2016   Status Achieved   OT SHORT TERM GOAL  #12   TITLE Pt will be able to pick up 8 pound object x 4 trials bilaterally to place on overhead shelf requiring at least 120* of shoulder flexion (for household mgmt tasks, grocery shopping ,etc)   Status Achieved   OT SHORT TERM GOAL  #13   TITLE Pt will be able to lift lightweight object unilaterally with RUE to 120* of shoulder flexion (baseline 115*)   Status Achieved  able to lift 4 pound object to this height with very minimal compensations   OT SHORT TERM GOAL  #14   TITLE Pt will be able to lift baby into and out of tub while in kneeling from and to baby seat using both UE's - 03/11/2016   Status Achieved  Pt sat on gma's lap and pt able to lift baby from gma's lap into tub and then lift baby out of tub and hand to Ashley [name] - 02/26/16 [place] #1   Title Pt will demonstrate ability to carry 18 pound object for at least 600 feet during functional activity 04/08/2016   Status On-going   OT LONG TERM GOAL #2   Title Pt will be mod I with sweeping kitchen floor using both UE's   Status Achieved   OT LONG TERM GOAL #3   Title Pt will be mod I stepping over ledge of baby gate holding items in her hands   Status On-going   OT LONG TERM GOAL #4   Title Pt will demonstrate abilty to turn wheel of care functional radius using both hands   Status Achieved   OT LONG TERM GOAL #5   Title Pt wil demonstrate ability to carry 10 pound object in RUE up/down stairs using railing   Status On-going   OT LONG TERM GOAL  #12   Status --  145 feet carrying box with 15 pound weight               Plan - 02/26/16 1250     Clinical Impression Statement Pt continues to progress toward goals. Pt able to problem solve strategies to begin to allow for brief time periods on her own without her mom's assistance.    Rehab Potential Good   Clinical Impairments Affecting Rehab Potential cognition, impaired sensation and anxiety   OT Frequency 2x / week   OT Duration 8 weeks   OT Treatment/Interventions Self-care/ADL training;Therapeutic exercise;Patient/family education;Balance training;Ultrasound;Neuromuscular education;Manual Therapy;Splinting;Therapeutic exercises;Energy conservation;Parrafin;DME and/or AE instruction;Therapeutic activities;Cognitive remediation/compensation;Gait Training;Fluidtherapy;Electrical Stimulation;Moist Heat;Contrast Bath;Passive range of motion;Visual/perceptual remediation/compensation   Plan NMR for RUE and trunk, carrying items, balance and transitional movements   Consulted and Agree with Plan of Care Patient      Patient will benefit from skilled therapeutic intervention in order to improve the following deficits and impairments:  Abnormal gait, Decreased coordination, Decreased range of motion, Difficulty walking, Impaired flexibility, Decreased endurance, Decreased safety awareness, Increased edema, Impaired tone, Decreased knowledge of precautions, Decreased activity tolerance, Decreased balance, Decreased knowledge of use of DME, Pain, Impaired UE functional use, Impaired vision/preception, Decreased cognition, Decreased mobility, Decreased strength  Visit Diagnosis: Other abnormalities of gait and mobility  Spastic hemiplegia affecting right dominant side (HCC)  Stiffness of right shoulder, not elsewhere classified  Abnormal posture  Frontal lobe and executive function deficit  Other disturbances of skin sensation    Problem List Patient Active Problem List   Diagnosis Date Noted  . Spastic hemiplegia and hemiparesis affecting dominant side ([place]) 09/13/2015  . Adhesive  capsulitis of right shoulder 08/24/2015  . Symptomatic partial epilepsy with simple partial seizures ([place]) 08/21/2015  . Nontraumatic cortical hemorrhage of cerebral hemisphere ([place])   . Seizure ([place])   . Seizures ([place])   . Adjustment disorder with depressed mood   . Contracture of muscle ankle and foot 06/11/2015  . Hemiplga fol ntrm intcrbl hemor aff right dominant side 06/06/2015  . Left-sided intracerebral hemorrhage ([place]) 06/04/2015  . Seizure disorder as sequela of cerebrovascular accident ([place])   . Seizure disorder ([place]) 06/03/2015  . Sepsis ([place]) 06/03/2015  . UTI (urinary tract infection) 06/03/2015  . Hypotension 06/02/2015  . Right spastic hemiparesis ([place]) 05/28/2015  .  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 History of anxiety disorder 05/28/2015  . ICH (intracerebral hemorrhage) ([place])   . Seizure disorder, nonconvulsive, with status epilepticus ([place])   . Cerebral venous thrombosis of cortical vein   . Cytotoxic cerebral edema ([place])   . IVH (intraventricular hemorrhage) ([place])   . Term pregnancy 05/07/2015  . Spontaneous vaginal delivery 05/07/2015    Quay Burow, OTR/L 02/26/2016, 12:53 PM  [facility] [address] [place], [place], [address] Phone: [phone]   Fax:  [phone]  Name: Ashley [name] MRN: [id number] Date of Birth: Mar 06, 1976

## 2016-02-28 ENCOUNTER — Ambulatory Visit: Payer: BLUE CROSS/BLUE SHIELD

## 2016-03-04 ENCOUNTER — Ambulatory Visit (INDEPENDENT_AMBULATORY_CARE_PROVIDER_SITE_OTHER): Payer: BLUE CROSS/BLUE SHIELD | Admitting: Psychology

## 2016-03-04 ENCOUNTER — Encounter: Payer: Self-pay | Admitting: Psychology

## 2016-03-04 ENCOUNTER — Ambulatory Visit: Payer: BLUE CROSS/BLUE SHIELD | Admitting: Occupational Therapy

## 2016-03-04 ENCOUNTER — Ambulatory Visit: Payer: BLUE CROSS/BLUE SHIELD

## 2016-03-04 DIAGNOSIS — F4323 Adjustment disorder with mixed anxiety and depressed mood: Secondary | ICD-10-CM | POA: Diagnosis not present

## 2016-03-04 DIAGNOSIS — R208 Other disturbances of skin sensation: Secondary | ICD-10-CM

## 2016-03-04 DIAGNOSIS — R41844 Frontal lobe and executive function deficit: Secondary | ICD-10-CM

## 2016-03-04 DIAGNOSIS — R293 Abnormal posture: Secondary | ICD-10-CM

## 2016-03-04 DIAGNOSIS — R2689 Other abnormalities of gait and mobility: Secondary | ICD-10-CM

## 2016-03-04 DIAGNOSIS — G8111 Spastic hemiplegia affecting right dominant side: Secondary | ICD-10-CM
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
## 2016-03-04 NOTE — Therapy (Signed)
[facility] [address] [place], [place], [address] Phone: [phone]   Fax:  [phone]  Occupational Therapy Treatment  Patient Details  Name: Ashley [name] MRN: [id number] Date of Birth: November 10, 1975 No Data Recorded  Encounter Date: 03/04/2016      OT End of Session - 03/04/16 1307    Visit Number 37   Number of Visits 18   Date for OT Re-Evaluation 04/08/16   Authorization Type BCBS   Authorization Time Period 30 visit COMBINED between PT/OT;  pt has utilized benefit and is now self pay.    OT Start Time 1015   OT Stop Time 1059   OT Time Calculation (min) 44 min   Activity Tolerance Patient tolerated treatment well      Past Medical History  Diagnosis Date  . Anxiety   . ICH (intracerebral hemorrhage) ([place])   . Stroke ([place])   . Seizures [facility])     Past Surgical History  Procedure Laterality Date  . Dilation and curettage of uterus      There were no vitals filed for this visit.      Subjective Assessment - 03/04/16 1025    Subjective  I fell this weekend on a wet bathroom floor.   Pertinent History see epic snapshot; pt with SAH 2 weeks after deliverying her 3rd child   Patient Stated Goals to be able to use my R arm normally   Currently in Pain? No/denies                      OT Treatments/Exercises (OP) - 03/04/16 0001    Neurological Re-education Exercises   Other Exercises 1 Neuro re ed to address trunk control, alignment, functional use of RUE and functional ambulation.  Pt able to carry 18 pounds for 600 feet today with RUE and min vc's for aligment and weight bearing on RLE.  Pt also practiced stepping over obstacles while carrying 18 pounds of weight - much improved today.             Balance Exercises - 03/04/16 1141    Balance Exercises: Standing   Other Standing Exercises At counter: with 0-2 UE support pt performed x5 jumps (with 0, 1, and 2 UE support). S to  ensure safety. Pt corrected R LE adduction after each jump as needed.             OT Short Term Goals - 03/04/16 1303    OT SHORT TERM GOAL  #11   TITLE Pt will be mod I with upgraded HEP - 01/08/2016   Status Achieved   OT SHORT TERM GOAL  #12   TITLE Pt will be able to pick up 8 pound object x 4 trials bilaterally to place on overhead shelf requiring at least 120* of shoulder flexion (for household mgmt tasks, grocery shopping ,etc)   Status Achieved   OT SHORT TERM GOAL  #13   TITLE Pt will be able to lift lightweight object unilaterally with RUE to 120* of shoulder flexion (baseline 115*)   Status Achieved  able to lift 4 pound object to this height with very minimal compensations   OT SHORT TERM GOAL  #14   TITLE Pt will be able to lift baby into and out of tub while in kneeling from and to baby seat using both UE's - 03/11/2016   Status Achieved  Pt sat on gma's lap and pt able to lift baby from gma's  lap into tub and then lift baby out of tub and hand to [place] - 03/04/16 1304    OT LONG TERM GOAL #1   Title Pt will demonstrate ability to carry 18 pound object for at least 600 feet during functional activity 04/08/2016   Status Achieved   OT LONG TERM GOAL #2   Title Pt will be mod I with sweeping kitchen floor using both UE's   Status Achieved   OT LONG TERM GOAL #3   Title Pt will be mod I stepping over ledge of baby gate holding items in her hands   Status Achieved   OT LONG TERM GOAL #4   Title Pt will demonstrate abilty to turn wheel of care functional radius using both hands   Status Achieved   OT LONG TERM GOAL #5   Title Pt wil demonstrate ability to carry 10 pound object in RUE up/down stairs using railing   Status On-going   OT LONG TERM GOAL  #12   Status --  145 feet carrying box with 15 pound weight               Plan - 03/04/16 1305    Clinical Impression Statement Pt continues to progress toward goals. Pt with  improving control for dynamic balance.    Rehab Potential Good   Clinical Impairments Affecting Rehab Potential cognition, impaired sensation and anxiety   OT Frequency 2x / week   OT Duration 8 weeks   OT Treatment/Interventions Self-care/ADL training;Therapeutic exercise;Patient/family education;Balance training;Ultrasound;Neuromuscular education;Manual Therapy;Splinting;Therapeutic exercises;Energy conservation;Parrafin;DME and/or AE instruction;Therapeutic activities;Cognitive remediation/compensation;Gait Training;Fluidtherapy;Electrical Stimulation;Moist Heat;Contrast Bath;Passive range of motion;Visual/perceptual remediation/compensation   Plan NMR for RUE and trunk, carrying items, balance and transitional movements, functional ambulation   Consulted and Agree with Plan of Care Patient      Patient will benefit from skilled therapeutic intervention in order to improve the following deficits and impairments:  Abnormal gait, Decreased coordination, Decreased range of motion, Difficulty walking, Impaired flexibility, Decreased endurance, Decreased safety awareness, Increased edema, Impaired tone, Decreased knowledge of precautions, Decreased activity tolerance, Decreased balance, Decreased knowledge of use of DME, Pain, Impaired UE functional use, Impaired vision/preception, Decreased cognition, Decreased mobility, Decreased strength  Visit Diagnosis: Spastic hemiplegia affecting right dominant side (HCC)  Abnormal posture  Other abnormalities of gait and mobility  Frontal lobe and executive function deficit  Other disturbances of skin sensation    Problem List Patient Active Problem List   Diagnosis Date Noted  . Spastic hemiplegia and hemiparesis affecting dominant side ([place]) 09/13/2015  . Adhesive capsulitis of right shoulder 08/24/2015  . Symptomatic partial epilepsy with simple partial seizures ([place]) 08/21/2015  . Nontraumatic cortical hemorrhage of cerebral hemisphere ([place])    . Seizure ([place])   . Seizures ([place])   . Adjustment disorder with depressed mood   . Contracture of muscle ankle and foot 06/11/2015  . Hemiplga fol ntrm intcrbl hemor aff right dominant side 06/06/2015  . Left-sided intracerebral hemorrhage ([place]) 06/04/2015  . Seizure disorder as sequela of cerebrovascular accident ([place])   . Seizure disorder ([place]) 06/03/2015  . Sepsis ([place]) 06/03/2015  . UTI (urinary tract infection) 06/03/2015  . Hypotension 06/02/2015  . Right spastic hemiparesis ([place]) 05/28/2015  . 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 History of anxiety disorder 05/28/2015  . ICH (intracerebral hemorrhage) ([place])   . Seizure disorder, nonconvulsive, with status epilepticus ([place])   . Cerebral  venous thrombosis of cortical vein   . Cytotoxic cerebral edema ([place])   . IVH (intraventricular hemorrhage) ([place])   . Term pregnancy 05/07/2015  . Spontaneous vaginal delivery 05/07/2015    Quay Burow , OTR/L  03/04/2016, 1:08 PM  [facility] [address] [place], [place], [address] Phone: [phone]   Fax:  [phone]  Name: Jennafer Bodine MRN: [id number] Date of Birth: 04-Aug-1976

## 2016-03-04 NOTE — Therapy (Signed)
[facility] [address] [place], [place], [address] Phone: [phone]   Fax:  [phone]  Physical Therapy Treatment  Patient Details  Name: Ashley [name] MRN: [id number] Date of Birth: 1976-02-03 No Data Recorded  Encounter Date: 03/04/2016      PT End of Session - 03/04/16 1151    Visit Number 63   Number of Visits 80   Date for PT Re-Evaluation 04/11/16   Authorization Type BCBS. Per Conan Bowens, visits now 30 for PT and OT total (15 per OT and 15 per PT). Pt wishes to continue at self pay after visit limit met.    Authorization - Visit Number 36   Authorization - Number of Visits 16   PT Start Time 1101   PT Stop Time 1143   PT Time Calculation (min) 42 min   Equipment Utilized During Treatment --  min guard to S   Activity Tolerance Patient tolerated treatment well   Behavior During Therapy [facility] for tasks assessed/performed      Past Medical History  Diagnosis Date  . Anxiety   . ICH (intracerebral hemorrhage) (HCC)   . Stroke (HCC)   . Seizures [facility])     Past Surgical History  Procedure Laterality Date  . Dilation and curettage of uterus      There were no vitals filed for this visit.      Subjective Assessment - 03/04/16 1103    Subjective Pt reported she fell this weekend, while getting ready on Saturday night (barefoot on bathroom floor and floor was wet-pt did not know floor was wet).  Pt denied severe injury or hitting head (she does have a bruise on R knee and L arm).   Pertinent History Seizures, low BP   Patient Stated Goals "Be as close to normal as much as possible and to go back to running" Get back to taking care of my kids, 79 weeks old and 7 and 63 years old   Currently in Pain? No/denies            [facility] PT Assessment - 03/04/16 1111    Functional Gait  Assessment   Gait assessed  Yes   Gait Level Surface Walks 20 ft, slow speed, abnormal gait pattern, evidence for imbalance  or deviates 10-15 in outside of the 12 in walkway width. Requires more than 7 sec to ambulate 20 ft.  7.2sec.   Change in Gait Speed Able to change speed, demonstrates mild gait deviations, deviates 6-10 in outside of the 12 in walkway width, or no gait deviations, unable to achieve a major change in velocity, or uses a change in velocity, or uses an assistive device.   Gait with Horizontal Head Turns Performs head turns smoothly with no change in gait. Deviates no more than 6 in outside 12 in walkway width   Gait with Vertical Head Turns Performs head turns with no change in gait. Deviates no more than 6 in outside 12 in walkway width.   Gait and Pivot Turn Pivot turns safely within 3 sec and stops quickly with no loss of balance.   Step Over Obstacle Is able to step over one shoe box (4.5 in total height) but must slow down and adjust steps to clear box safely. May require verbal cueing.   Gait with Narrow Base of Support Ambulates less than 4 steps heel to toe or cannot perform without assistance.   Gait with Eyes Closed Walks 20 ft, uses assistive device,  slower speed, mild gait deviations, deviates 6-10 in outside 12 in walkway width. Ambulates 20 ft in less than 9 sec but greater than 7 sec.  7.3sec.   Ambulating Backwards Walks 20 ft, uses assistive device, slower speed, mild gait deviations, deviates 6-10 in outside 12 in walkway width.   Steps Alternating feet, must use rail.   Total Score 19                     [facility] Adult PT Treatment/Exercise - 03/04/16 1111    Ambulation/Gait   Ambulation/Gait Yes   Ambulation/Gait Assistance 5: Supervision;4: Min guard   Ambulation/Gait Assistance Details Performed with pt's new [place] foot up brace donned (shoeless portion, as ankle portion too small, used PT clinic's ankle portion). 1 LOB which pt self corrected with stepping strategy. Cues to improve weight shifting onto R LE, stride length, and to decr. R arm flexion. Please see pt ed  for foot up brace instructions provided to pt.   Ambulation Distance (Feet) 115 Feet  x2   Assistive device None   Gait Pattern Decreased stance time - right;Decreased weight shift to right;Right foot flat;Decreased hip/knee flexion - right;Step-through pattern;Decreased step length - left;Trunk rotated posteriorly on right   Ambulation Surface Level;Indoor             Balance Exercises - 03/04/16 1141    Balance Exercises: Standing   Other Standing Exercises At counter: with 0-2 UE support pt performed x5 jumps (with 0, 1, and 2 UE support). S to ensure safety. Pt corrected R LE adduction after each jump as needed.           PT Education - 03/04/16 1150    Education provided Yes   Education Details PT educated on donning, donning, and utilizing her new [place] foot up brace. Shoeless portion (medium) fit well, however, pt's medium ankle portion was too small despite pt's measurements consistent with medium. PT instructed pt to return medium ankle portion for a large ankle portion and to keep shoeless medium. PT discussed goal progress with pt.   Person(s) Educated Patient   Methods Explanation;Demonstration;Verbal cues   Comprehension Verbalized understanding;Returned demonstration          PT Short Term Goals - 03/04/16 1156    PT SHORT TERM GOAL #1   Title Pt will be IND in HEP to improve strength, balance, and endurance. Target date: 01/10/16.   Status Achieved   PT SHORT TERM GOAL #2   Title Pt will improve gait speed to >/=1.33f/sec to decrease falls risk. Target date: 07/27/15.   Status Achieved   PT SHORT TERM GOAL #3   Title Pt will ambulate 300' with LRAD, over even terrain, at MOD I level to improve functional mobility. Target date: 07/27/15.   Status Achieved   PT SHORT TERM GOAL #4   Title Pt will perform TUG in </=13.5 seconds with LRAD to decr. falls risk. Target date: 01/10/15.   Status Achieved   PT SHORT TERM GOAL #5   Title Perform BERG and write STG and  LTG as appropriate. Target date: 07/27/15.   Status Achieved   PT SHORT TERM GOAL #6   Title Pt will improve BERG to >/=51/56 to decr. falls risk. Target date: 07/27/15.   Status Achieved   PT SHORT TERM GOAL #7   Title Pt will amb. 500' over even/uneven terrain with LRAD at MOD I level to improve functional mobility. Target date: 01/10/16   Status On-going  PT SHORT TERM GOAL #8   Title Pt will ascend/descend curb with LRAD at MOD I level to improve functional mobility. Target date: 01/10/16   Status On-going   PT SHORT TERM GOAL #9   TITLE Pt will ascend/descend 12 steps with one handrail at MOD I level to improve functional mobility. Target date: 01/10/16   Status Achieved   PT SHORT TERM GOAL #10   TITLE Pt will incr. FGA score to >/=20/24 to decr. falls risk. Target date: 03/10/16   Status Partially Met   PT SHORT TERM GOAL #11   TITLE Pt will dance for 5 minutes with S and no LOB  to dance with family. Target date: 03/10/16   Status On-going   PT SHORT TERM GOAL #12   TITLE Pt will jump x5 reps with S to play with children at home. Target date: 03/10/16   Status Achieved   PT SHORT TERM GOAL #13   TITLE Pt will transfer into bed with use of stool (height 35") with S to improve functional mobility at home. Target date: 03/10/16   Status On-going           PT Long Term Goals - 03/04/16 1156    PT LONG TERM GOAL #1   Title Pt will ambulate 1000' over even/uneven terrain, with LRAD, at MOD I level to improve functional mobiliity. Target date: 02/07/16/17.   Baseline All unmet LTGs will be carried over to new 8 week POC: 04/11/16   Status On-going   PT LONG TERM GOAL #2   Title Pt will amb. 300' over even terrain without an AD, IND, to safely amb. in home. Target date: 04/11/16   Status On-going   PT LONG TERM GOAL #3   Title Pt will improve gait speed to >/=2.19f/sec, with LRAD, to amb. safely in the community. Target date: 11/12/14.   Status Achieved   PT LONG TERM GOAL #4    Title Pt will verbalize plans to join a fitness center upon d/c from PT to continue to maintain strength and endurance gains made during PT. Target date: 04/11/16.   Status On-going   PT LONG TERM GOAL #5   Title Pt improve BERG score to >/=55/56 to decr. falls risk. Target date: 12/11/15   Status Achieved   PT LONG TERM GOAL #6   Title Pt will ascend/descend curb with LRAD at MOD I level to improve functional mobility. Target date: 02/07/16   Status Achieved   PT LONG TERM GOAL #7   Title Pt will ascend/descend 12 steps no handrail at MOD I level, while carrying 20 lbs.l to improve functional mobility and carry child at home. Target date: 04/11/16   Status On-going   PT LONG TERM GOAL #8   Title Pt will ascend/descend ladder at MOD I level in order to transfer in/out of water. Target date: 04/11/16   Status On-going   PT LONG TERM GOAL  #9   TITLE Pt will be able to transfer on/off boat at MOD I level, and no LOB, in order improve functional mobility and participate in family activities. Target date: 04/11/16   Status On-going   PT LONG TERM GOAL  #10   TITLE Pt will be IIND in progressed HEP in order to improve balance, strength and flexibility. Target date: 02/07/16   Status Achieved   PT LONG TERM GOAL  #11   TITLE Pt will improve DGI score to >/=20/24 to decr. falls risk. Target date: 02/07/16   Status  Achieved   PT LONG TERM GOAL  #12   TITLE Pt will improve FGA score to >/=25/30 to decr. falls risk. Target date: 04/11/16   Status On-going               Plan - 03/04/16 1152    Clinical Impression Statement Pt demonstrated progress as she met STG 12 and partially met STG 10. Pt's FGA score of 19/30 is still predictive of high falls risk. Pt would continue to benefit from skilled PT to improve safety during functional mobility.   Rehab Potential Good   Clinical Impairments Affecting Rehab Potential Seizures which may limit intensity of PT   PT Frequency 2x / week   PT Duration 8  weeks   PT Treatment/Interventions ADLs/Self Care Home Management;Neuromuscular re-education;Cognitive remediation;Biofeedback;DME Instruction;Gait training;Stair training;Canalith Repostioning;Patient/family education;Orthotic Fit/Training;Balance training;Therapeutic exercise;Manual techniques;Therapeutic activities;Vestibular   PT Next Visit Plan Continue to assess STGs.   PT Home Exercise Plan Stretching/strength/balance HEP   Consulted and Agree with Plan of Care Patient      Patient will benefit from skilled therapeutic intervention in order to improve the following deficits and impairments:  Abnormal gait, Decreased endurance, Impaired sensation, Decreased knowledge of precautions, Decreased activity tolerance, Decreased knowledge of use of DME, Decreased strength, Impaired UE functional use, Impaired tone, Decreased balance, Decreased mobility, Decreased cognition, Decreased range of motion, Decreased safety awareness, Decreased coordination, Impaired flexibility, Postural dysfunction  Visit Diagnosis: Other abnormalities of gait and mobility  Spastic hemiplegia affecting right dominant side [facility])     Problem List Patient Active Problem List   Diagnosis Date Noted  . Spastic hemiplegia and hemiparesis affecting dominant side ([place]) 09/13/2015  . Adhesive capsulitis of right shoulder 08/24/2015  . Symptomatic partial epilepsy with simple partial seizures ([place]) 08/21/2015  . Nontraumatic cortical hemorrhage of cerebral hemisphere ([place])   . Seizure ([place])   . Seizures ([place])   . Adjustment disorder with depressed mood   . Contracture of muscle ankle and foot 06/11/2015  . Hemiplga fol ntrm intcrbl hemor aff right dominant side 06/06/2015  . Left-sided intracerebral hemorrhage ([place]) 06/04/2015  . Seizure disorder as sequela of cerebrovascular accident ([place])   . Seizure disorder ([place]) 06/03/2015  . Sepsis ([place]) 06/03/2015  . UTI (urinary tract infection) 06/03/2015  . Hypotension  06/02/2015  . Right spastic hemiparesis ([place]) 05/28/2015  . 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 History of anxiety disorder 05/28/2015  . ICH (intracerebral hemorrhage) ([place])   . Seizure disorder, nonconvulsive, with status epilepticus ([place])   . Cerebral venous thrombosis of cortical vein   . Cytotoxic cerebral edema ([place])   . IVH (intraventricular hemorrhage) ([place])   . Term pregnancy 05/07/2015  . Spontaneous vaginal delivery 05/07/2015    Miller,Jennifer L 03/04/2016, 11:56 AM  [facility] [address] [place], [place], [address] Phone: [phone]   Fax:  [phone]  Name: Latrelle Fuston MRN: [id number] Date of Birth: May 20, 1976    Geoffry Paradise, PT,DPT 03/04/2016 11:56 AM Phone: [phone] Fax: [phone]

## 2016-03-06 ENCOUNTER — Ambulatory Visit: Payer: BLUE CROSS/BLUE SHIELD

## 2016-03-06 ENCOUNTER — Ambulatory Visit: Payer: BLUE CROSS/BLUE SHIELD | Admitting: Occupational Therapy

## 2016-03-06 ENCOUNTER — Encounter: Payer: Self-pay | Admitting: Occupational Therapy

## 2016-03-06 DIAGNOSIS — R41844 Frontal lobe and executive function deficit: Secondary | ICD-10-CM

## 2016-03-06 DIAGNOSIS — G8111 Spastic hemiplegia affecting right dominant side: Secondary | ICD-10-CM

## 2016-03-06 DIAGNOSIS — R2689 Other abnormalities of gait and mobility: Secondary | ICD-10-CM | POA: Diagnosis not present

## 2016-03-06 DIAGNOSIS — R208 Other disturbances of skin sensation: Secondary | ICD-10-CM

## 2016-03-06 DIAGNOSIS — M25611 Stiffness of right shoulder, not elsewhere classified: Secondary | ICD-10-CM

## 2016-03-06 DIAGNOSIS — R293 Abnormal posture: Secondary | ICD-10-CM

## 2016-03-06 NOTE — Therapy (Signed)
[facility] [address] [place], [place], [address] Phone: [phone]   Fax:  [phone]  Occupational Therapy Treatment  Patient Details  Name: Ashley [name] MRN: [id number] Date of Birth: 07/23/1976 No Data Recorded  Encounter Date: 03/06/2016      OT End of Session - 03/06/16 0951    Visit Number 47   Number of Visits 38   Date for OT Re-Evaluation 04/08/16   Authorization Type BCBS   Authorization Time Period 30 visit COMBINED between PT/OT;  pt has utilized benefit and is now self pay.    OT Start Time 0845   OT Stop Time 0932   OT Time Calculation (min) 47 min   Activity Tolerance Patient tolerated treatment well      Past Medical History  Diagnosis Date  . Anxiety   . ICH (intracerebral hemorrhage) ([place])   . Stroke ([place])   . Seizures [facility])     Past Surgical History  Procedure Laterality Date  . Dilation and curettage of uterus      There were no vitals filed for this visit.      Subjective Assessment - 03/06/16 0846    Subjective  I measured the edge of my tub - it is 15 inches.   Pertinent History see epic snapshot; pt with [facility] 2 weeks after deliverying her 3rd child   Patient Stated Goals to be able to use my R arm normally   Currently in Pain? No/denies                      OT Treatments/Exercises (OP) - 03/06/16 0940    ADLs   Cooking Treatment focused on functional mobility and pt's ability to climb stairs using railing and carrying heavy inanimate objects ( 18 pounds).  If pt as railing, pt is able to complete with confidence.  Pt unable to do without a railing.  Pt also states  she can go up the stairs at home carrying baby however needs close supervision/intermittent contact guard coming down the stairs at home as she has to hold railing with R hand and has very poor sensation.  Also addressed pt's ability to balance and step over 15 inch tub edge.  Pt is currently  taking a bath at home and hopes to be able to master ambulatory transfers into the shower.  In the interim, simulated using seat in shower and sitting on seat and swinging legs over the edge. Pt  also to purchase suction grab bars to use in the shower and mat for floor of shower as she feels unsafe standing and turning around in the shower.  Pt to try and complete this weekend.  Pt able to complete this type of transfer independently and felt much safer in clinic using  wall as simulation for grab bars and turning without brace on (to simulate actual showering)                  OT Short Term Goals - 03/06/16 0948    OT SHORT TERM GOAL  #11   TITLE Pt will be mod I with upgraded HEP - 01/08/2016   Status Achieved   OT SHORT TERM GOAL  #12   TITLE Pt will be able to pick up 8 pound object x 4 trials bilaterally to place on overhead shelf requiring at least 120* of shoulder flexion (for household mgmt tasks, grocery shopping ,etc)   Status Achieved   OT SHORT  TERM GOAL  #13   TITLE Pt will be able to lift lightweight object unilaterally with RUE to 120* of shoulder flexion (baseline 115*)   Status Achieved  able to lift 4 pound object to this height with very minimal compensations   OT SHORT TERM GOAL  #14   TITLE Pt will be able to lift baby into and out of tub while in kneeling from and to baby seat using both UE's - 03/11/2016   Status Achieved  Pt sat on gma's lap and pt able to lift baby from gma's lap into tub and then lift baby out of tub and hand to [place] Term Goals - 03/06/16 0948    OT LONG TERM GOAL #1   Title Pt will demonstrate ability to carry 18 pound object for at least 600 feet during functional activity 04/08/2016   Status Achieved   OT LONG TERM GOAL #2   Title Pt will be mod I with sweeping kitchen floor using both UE's   Status Achieved   OT LONG TERM GOAL #3   Title Pt will be mod I stepping over ledge of baby gate holding items in her hands    Status Achieved   OT LONG TERM GOAL #4   Title Pt will demonstrate abilty to turn wheel of care functional radius using both hands   Status Achieved   OT LONG TERM GOAL #5   Title Pt wil demonstrate ability to carry 10 pound object in RUE up/down stairs using railing   Status Achieved   OT LONG TERM GOAL  #12   Status --  145 feet carrying box with 15 pound weight               Plan - 03/06/16 0949    Clinical Impression Statement Pt has met all LTG's early. Pt wishes to continue with therapy;  will work with paitent next session to establish new goals. Pt in agreement and will come with goals she wishes to work on next session.  Will renew at that time.    Rehab Potential Good   Clinical Impairments Affecting Rehab Potential cognition, impaired sensation and anxiety   OT Frequency 2x / week   OT Duration 8 weeks   OT Treatment/Interventions Self-care/ADL training;Therapeutic exercise;Patient/family education;Balance training;Ultrasound;Neuromuscular education;Manual Therapy;Splinting;Therapeutic exercises;Energy conservation;Parrafin;DME and/or AE instruction;Therapeutic activities;Cognitive remediation/compensation;Gait Training;Fluidtherapy;Electrical Stimulation;Moist Heat;Contrast Bath;Passive range of motion;Visual/perceptual remediation/compensation   Plan establish new goals with pt;  NMR for balance for stepping over higher objects; functional mobility, transitional movements, balance.   Consulted and Agree with Plan of Care Patient      Patient will benefit from skilled therapeutic intervention in order to improve the following deficits and impairments:  Abnormal gait, Decreased coordination, Decreased range of motion, Difficulty walking, Impaired flexibility, Decreased endurance, Decreased safety awareness, Increased edema, Impaired tone, Decreased knowledge of precautions, Decreased activity tolerance, Decreased balance, Decreased knowledge of use of DME, Pain, Impaired  UE functional use, Impaired vision/preception, Decreased cognition, Decreased mobility, Decreased strength  Visit Diagnosis: Spastic hemiplegia affecting right dominant side (HCC)  Other abnormalities of gait and mobility  Abnormal posture  Frontal lobe and executive function deficit  Other disturbances of skin sensation  Stiffness of right shoulder, not elsewhere classified    Problem List Patient Active Problem List   Diagnosis Date Noted  . Spastic hemiplegia and hemiparesis affecting dominant side ([place]) 09/13/2015  . Adhesive capsulitis of right shoulder 08/24/2015  .  Symptomatic partial epilepsy with simple partial seizures ([place]) 08/21/2015  . Nontraumatic cortical hemorrhage of cerebral hemisphere ([place])   . Seizure ([place])   . Seizures ([place])   . Adjustment disorder with depressed mood   . Contracture of muscle ankle and foot 06/11/2015  . Hemiplga fol ntrm intcrbl hemor aff right dominant side 06/06/2015  . Left-sided intracerebral hemorrhage ([place]) 06/04/2015  . Seizure disorder as sequela of cerebrovascular accident ([place])   . Seizure disorder ([place]) 06/03/2015  . Sepsis ([place]) 06/03/2015  . UTI (urinary tract infection) 06/03/2015  . Hypotension 06/02/2015  . Right spastic hemiparesis ([place]) 05/28/2015  . 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 History of anxiety disorder 05/28/2015  . ICH (intracerebral hemorrhage) ([place])   . Seizure disorder, nonconvulsive, with status epilepticus ([place])   . Cerebral venous thrombosis of cortical vein   . Cytotoxic cerebral edema ([place])   . IVH (intraventricular hemorrhage) ([place])   . Term pregnancy 05/07/2015  . Spontaneous vaginal delivery 05/07/2015    Quay Burow, OTR/L 03/06/2016, 9:52 AM  [facility] [address] [place], [place], [address] Phone: [phone]   Fax:  [phone]  Name: Ashley [name] MRN: [id number] Date  of Birth: [birth year]

## 2016-03-06 NOTE — Therapy (Signed)
[facility] [address] [place], [place], [address] Phone: [phone]   Fax:  [phone]  Physical Therapy Treatment  Patient Details  Name: Ashley [name] MRN: [id number] Date of Birth: Jun 23, 1976 No Data Recorded  Encounter Date: 03/06/2016      PT End of Session - 03/06/16 0900    Visit Number 64   Number of Visits 80   Date for PT Re-Evaluation 04/11/16   Authorization Type [employer]. Per Laverda Page, visits now 30 for PT and OT total (15 per OT and 15 per PT). Pt wishes to continue at self pay after visit limit met.    Authorization - Visit Number 60   Authorization - Number of Visits 16   PT Start Time 0804   PT Stop Time 0843   PT Time Calculation (min) 39 min   Equipment Utilized During Treatment --  min guard to S for safety   Activity Tolerance Patient tolerated treatment well   Behavior During Therapy [facility] for tasks assessed/performed      Past Medical History  Diagnosis Date  . Anxiety   . ICH (intracerebral hemorrhage) ([place])   . Stroke ([place])   . Seizures [facility])     Past Surgical History  Procedure Laterality Date  . Dilation and curettage of uterus      There were no vitals filed for this visit.      Subjective Assessment - 03/06/16 0806    Subjective Pt denied falls or changes since last visit.    Patient is accompained by: Family member   Pertinent History Seizures, low BP   Patient Stated Goals "Be as close to normal as much as possible and to go back to running" Get back to taking care of my kids, 13 weeks old and 43 and 22 years old   Currently in Pain? No/denies                         [facility] Adult PT Treatment/Exercise - 03/06/16 0808    Transfers   Transfers Sit to Stand;Stand to Sit   Sit to Stand 5: Supervision   Sit to Stand Details Verbal cues for sequencing;Visual cues/gestures for sequencing   Sit to Stand Details (indicate cue type and reason) Cues for  technique, pt performed x3 reps to 35" height to mimic transfer in/out of bed.   Stand to Sit 5: Supervision   Stand to Sit Details performed x3 reps.   Ambulation/Gait   Ambulation/Gait Yes   Ambulation/Gait Assistance 5: Supervision;6: Modified independent (Device/Increase time)   Ambulation/Gait Assistance Details Pt required cues twice to decr. Speed in order to decr. R UE flexion and to improve stance time on RLE, otherwise MOD I to IND during gait.   Ambulation Distance (Feet) 700 Feet, 115', 50'x2   Assistive device None   Gait Pattern Decreased stance time - right;Decreased weight shift to right;Right foot flat;Decreased hip/knee flexion - right;Step-through pattern;Decreased step length - left;Trunk rotated posteriorly on right   Ambulation Surface Level;Indoor, Outdoor, paved, Museum/gallery exhibitions officer 6: Modified independent (Device/increase time);5: Supervision   Curb Details (indicate cue type and reason) MOD I to ascend curb and S to descend curb to ensure safety, no LOB.   High Level Balance   High Level Balance Activities Other (comment)  Dancing   High Level Balance Comments Pt performed dances: cupid shuffle and the wobble dance with PT supervision to min guard. Cues for technique,  isolate hip motion, and improved wt. shiftng. Performed with min guard to S.                PT Education - 03/06/16 0858    Education provided Yes   Education Details PT discussed goal progress.   Person(s) Educated Patient   Methods Explanation   Comprehension Verbalized understanding          PT Short Term Goals - 03/06/16 0903    PT SHORT TERM GOAL #1   Title Pt will be IND in HEP to improve strength, balance, and endurance. Target date: 01/10/16.   Status Achieved   PT SHORT TERM GOAL #2   Title Pt will improve gait speed to >/=1.44f/sec to decrease falls risk. Target date: 07/27/15.   Status Achieved   PT SHORT TERM GOAL #3   Title Pt will ambulate 300' with LRAD, over even  terrain, at MOD I level to improve functional mobility. Target date: 07/27/15.   Status Achieved   PT SHORT TERM GOAL #4   Title Pt will perform TUG in </=13.5 seconds with LRAD to decr. falls risk. Target date: 01/10/15.   Status Achieved   PT SHORT TERM GOAL #5   Title Perform BERG and write STG and LTG as appropriate. Target date: 07/27/15.   Status Achieved   PT SHORT TERM GOAL #6   Title Pt will improve BERG to >/=51/56 to decr. falls risk. Target date: 07/27/15.   Status Achieved   PT SHORT TERM GOAL #7   Title Pt will amb. 500' over even/uneven terrain with LRAD at MOD I level to improve functional mobility. Target date: 01/10/16   Status Partially Met   PT SHORT TERM GOAL #8   Title Pt will ascend/descend curb with LRAD at MOD I level to improve functional mobility. Target date: 01/10/16   Status Partially Met   PT SHORT TERM GOAL #9   TITLE Pt will ascend/descend 12 steps with one handrail at MOD I level to improve functional mobility. Target date: 01/10/16   Status Achieved   PT SHORT TERM GOAL #10   TITLE Pt will incr. FGA score to >/=20/24 to decr. falls risk. Target date: 03/10/16   Status Partially Met   PT SHORT TERM GOAL #11   TITLE Pt will dance for 5 minutes with S and no LOB  to dance with family. Target date: 03/10/16   Status Partially Met   PT SHORT TERM GOAL #12   TITLE Pt will jump x5 reps with S to play with children at home. Target date: 03/10/16   Status Achieved   PT SHORT TERM GOAL #13   TITLE Pt will transfer into bed with use of stool (height 35") with S to improve functional mobility at home. Target date: 03/10/16   Status Achieved           PT Long Term Goals - 03/04/16 1156    PT LONG TERM GOAL #1   Title Pt will ambulate 1000' over even/uneven terrain, with LRAD, at MOD I level to improve functional mobiliity. Target date: 02/07/16/17.   Baseline All unmet LTGs will be carried over to new 8 week POC: 04/11/16   Status On-going   PT LONG TERM GOAL #2    Title Pt will amb. 300' over even terrain without an AD, IND, to safely amb. in home. Target date: 04/11/16   Status On-going   PT LONG TERM GOAL #3   Title Pt will improve gait speed to >/=  2.83f/sec, with LRAD, to amb. safely in the community. Target date: 11/12/14.   Status Achieved   PT LONG TERM GOAL #4   Title Pt will verbalize plans to join a fitness center upon d/c from PT to continue to maintain strength and endurance gains made during PT. Target date: 04/11/16.   Status On-going   PT LONG TERM GOAL #5   Title Pt improve BERG score to >/=55/56 to decr. falls risk. Target date: 12/11/15   Status Achieved   PT LONG TERM GOAL #6   Title Pt will ascend/descend curb with LRAD at MOD I level to improve functional mobility. Target date: 02/07/16   Status Achieved   PT LONG TERM GOAL #7   Title Pt will ascend/descend 12 steps no handrail at MOD I level, while carrying 20 lbs.l to improve functional mobility and carry child at home. Target date: 04/11/16   Status On-going   PT LONG TERM GOAL #8   Title Pt will ascend/descend ladder at MOD I level in order to transfer in/out of water. Target date: 04/11/16   Status On-going   PT LONG TERM GOAL  #9   TITLE Pt will be able to transfer on/off boat at MOD I level, and no LOB, in order improve functional mobility and participate in family activities. Target date: 04/11/16   Status On-going   PT LONG TERM GOAL  #10   TITLE Pt will be IIND in progressed HEP in order to improve balance, strength and flexibility. Target date: 02/07/16   Status Achieved   PT LONG TERM GOAL  #11   TITLE Pt will improve DGI score to >/=20/24 to decr. falls risk. Target date: 02/07/16   Status Achieved   PT LONG TERM GOAL  #12   TITLE Pt will improve FGA score to >/=25/30 to decr. falls risk. Target date: 04/11/16   Status On-going               Plan - 03/06/16 0901    Clinical Impression Statement Pt demonstrated progress as she met STG 13 and partially met  STGs 7, 8, and 11. All unmet STGs will be carried to 04/11/16. Pt continues to experience difficulty with lateral wt. shifting to the R LE and during terminal RLE stance during gait and balance activities. Continue with POC.    Rehab Potential Good   Clinical Impairments Affecting Rehab Potential Seizures which may limit intensity of PT   PT Frequency 2x / week   PT Duration 8 weeks   PT Treatment/Interventions ADLs/Self Care Home Management;Neuromuscular re-education;Cognitive remediation;Biofeedback;DME Instruction;Gait training;Stair training;Canalith Repostioning;Patient/family education;Orthotic Fit/Training;Balance training;Therapeutic exercise;Manual techniques;Therapeutic activities;Vestibular   PT Next Visit Plan R SLS activities without UE support, gait training without AD, gastroc/soleus power strengthening.   PT Home Exercise Plan Stretching/strength/balance HEP   Consulted and Agree with Plan of Care Patient      Patient will benefit from skilled therapeutic intervention in order to improve the following deficits and impairments:  Abnormal gait, Decreased endurance, Impaired sensation, Decreased knowledge of precautions, Decreased activity tolerance, Decreased knowledge of use of DME, Decreased strength, Impaired UE functional use, Impaired tone, Decreased balance, Decreased mobility, Decreased cognition, Decreased range of motion, Decreased safety awareness, Decreased coordination, Impaired flexibility, Postural dysfunction  Visit Diagnosis: Other abnormalities of gait and mobility  Spastic hemiplegia affecting right dominant side ([facility]     Problem List Patient Active Problem List   Diagnosis Date Noted  . Spastic hemiplegia and hemiparesis affecting dominant side ([place] 09/13/2015  .  Adhesive capsulitis of right shoulder 08/24/2015  . Symptomatic partial epilepsy with simple partial seizures ([place]) 08/21/2015  . Nontraumatic cortical hemorrhage of cerebral hemisphere ([place])    . Seizure ([place])   . Seizures ([place])   . Adjustment disorder with depressed mood   . Contracture of muscle ankle and foot 06/11/2015  . Hemiplga fol ntrm intcrbl hemor aff right dominant side 06/06/2015  . Left-sided intracerebral hemorrhage ([place]) 06/04/2015  . Seizure disorder as sequela of cerebrovascular accident ([place])   . Seizure disorder ([place]) 06/03/2015  . Sepsis ([place]) 06/03/2015  . UTI (urinary tract infection) 06/03/2015  . Hypotension 06/02/2015  . Right spastic hemiparesis ([place]) 05/28/2015  . 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 History of anxiety disorder 05/28/2015  . ICH (intracerebral hemorrhage) ([place])   . Seizure disorder, nonconvulsive, with status epilepticus ([place])   . Cerebral venous thrombosis of cortical vein   . Cytotoxic cerebral edema ([place])   . IVH (intraventricular hemorrhage) ([place])   . Term pregnancy 05/07/2015  . Spontaneous vaginal delivery 05/07/2015    Miller,Jennifer L 03/06/2016, 9:05 AM  [facility] [address] [place], [place], [address] Phone: [phone]   Fax:  [phone]  Name: Ashley [name] MRN: [id number] Date of Birth: 04-20-1976    Geoffry Paradise, PT,DPT 03/06/2016 9:05 AM Phone: [phone] Fax: [phone]

## 2016-03-11 ENCOUNTER — Ambulatory Visit (INDEPENDENT_AMBULATORY_CARE_PROVIDER_SITE_OTHER): Payer: BLUE CROSS/BLUE SHIELD | Admitting: Psychology

## 2016-03-11 ENCOUNTER — Encounter: Payer: Self-pay | Admitting: Psychology

## 2016-03-11 ENCOUNTER — Encounter: Payer: Self-pay | Admitting: Occupational Therapy

## 2016-03-11 ENCOUNTER — Ambulatory Visit: Payer: BLUE CROSS/BLUE SHIELD | Admitting: Physical Therapy

## 2016-03-11 ENCOUNTER — Ambulatory Visit: Payer: BLUE CROSS/BLUE SHIELD | Admitting: Occupational Therapy

## 2016-03-11 DIAGNOSIS — R293 Abnormal posture: Secondary | ICD-10-CM

## 2016-03-11 DIAGNOSIS — R29818 Other symptoms and signs involving the nervous system: Secondary | ICD-10-CM

## 2016-03-11 DIAGNOSIS — R2991 Unspecified symptoms and signs involving the musculoskeletal system: Secondary | ICD-10-CM

## 2016-03-11 DIAGNOSIS — F4323 Adjustment disorder with mixed anxiety and depressed mood: Secondary | ICD-10-CM

## 2016-03-11 DIAGNOSIS — R208 Other disturbances of skin sensation: Secondary | ICD-10-CM

## 2016-03-11 DIAGNOSIS — G8111 Spastic hemiplegia affecting right dominant side: Secondary | ICD-10-CM

## 2016-03-11 DIAGNOSIS — R269 Unspecified abnormalities of gait and mobility: Secondary | ICD-10-CM

## 2016-03-11 DIAGNOSIS — R41844 Frontal lobe and executive function deficit: Secondary | ICD-10-CM

## 2016-03-11 DIAGNOSIS — R2689 Other abnormalities of gait and mobility: Secondary | ICD-10-CM

## 2016-03-11 DIAGNOSIS — M25611 Stiffness of right shoulder, not elsewhere classified: Secondary | ICD-10-CM

## 2016-03-11 DIAGNOSIS — R29898 Other symptoms and signs involving the musculoskeletal system: Secondary | ICD-10-CM

## 2016-03-11 NOTE — Therapy (Signed)
[facility] [address] [place], [place], [address] Phone: [phone]   Fax:  [phone]  Physical Therapy Treatment  Patient Details  Name: Ashley [name] MRN: [id number] Date of Birth: 1976/03/04 No Data Recorded  Encounter Date: 03/11/2016      PT End of Session - 03/11/16 1636    Visit Number 65   Number of Visits 24   PT Start Time 1401   PT Stop Time 1446   PT Time Calculation (min) 45 min   Activity Tolerance Patient tolerated treatment well      Past Medical History  Diagnosis Date  . Anxiety   . ICH (intracerebral hemorrhage) ([place])   . Stroke ([place])   . Seizures [facility])     Past Surgical History  Procedure Laterality Date  . Dilation and curettage of uterus      There were no vitals filed for this visit.      Subjective Assessment - 03/11/16 1631    Subjective She deines falling since last visit but stated it is a big concern ; she is doing some regular therex with focus on core; she would like to not have to use the AFO eventually; she will be doing a trial of the muscle stim unit soon.     Currently in Pain? No/denies         There ex Supine;  Bridge feet up on 45 cm stability ball; single leg hold-hold 3 seconds; ham curls  2 x 10  Anterior tib ex's ;  At wall - standing 1' away from wall and doing a posterior lean-leans back as far as she can w/o losing balance then pulls her weight back over base of support Gait training; focus in bars looking at gait in mirror--focus on single leg stance and swing phase; vq' s for posture/ swing phase control -especially on the right and with right stance keeping pelvis level;  amb with ski pole in left hand at close of tx ; vq's for non-antalgic pattern                        PT Education - 03/11/16 1634    Education provided Yes   Education Details Instructed in supne bridge with feet up on stabilty ball ; single leg bridge and  hamstring curls;  standing and practicing phases of gait-prefer with mirror for visual feedback; ; focus on core strength with glut /hip extensor/abd strength; added posterior lean at wall for anterior tib ex's   Person(s) Educated Patient   Methods Demonstration   Comprehension Returned demonstration          PT Short Term Goals - 03/06/16 0903    PT SHORT TERM GOAL #1   Title Pt will be IND in HEP to improve strength, balance, and endurance. Target date: 01/10/16.   Status Achieved   PT SHORT TERM GOAL #2   Title Pt will improve gait speed to >/=1.55f/sec to decrease falls risk. Target date: 07/27/15.   Status Achieved   PT SHORT TERM GOAL #3   Title Pt will ambulate 300' with LRAD, over even terrain, at MOD I level to improve functional mobility. Target date: 07/27/15.   Status Achieved   PT SHORT TERM GOAL #4   Title Pt will perform TUG in </=13.5 seconds with LRAD to decr. falls risk. Target date: 01/10/15.   Status Achieved   PT SHORT TERM GOAL #5   Title Perform BERG and  write STG and LTG as appropriate. Target date: 07/27/15.   Status Achieved   PT SHORT TERM GOAL #6   Title Pt will improve BERG to >/=51/56 to decr. falls risk. Target date: 07/27/15.   Status Achieved   PT SHORT TERM GOAL #7   Title Pt will amb. 500' over even/uneven terrain with LRAD at MOD I level to improve functional mobility. Target date: 01/10/16   Status Partially Met   PT SHORT TERM GOAL #8   Title Pt will ascend/descend curb with LRAD at MOD I level to improve functional mobility. Target date: 01/10/16   Status Partially Met   PT SHORT TERM GOAL #9   TITLE Pt will ascend/descend 12 steps with one handrail at MOD I level to improve functional mobility. Target date: 01/10/16   Status Achieved   PT SHORT TERM GOAL #10   TITLE Pt will incr. FGA score to >/=20/24 to decr. falls risk. Target date: 03/10/16   Status Partially Met   PT SHORT TERM GOAL #11   TITLE Pt will dance for 5 minutes with S and no  LOB  to dance with family. Target date: 03/10/16   Status Partially Met   PT SHORT TERM GOAL #12   TITLE Pt will jump x5 reps with S to play with children at home. Target date: 03/10/16   Status Achieved   PT SHORT TERM GOAL #13   TITLE Pt will transfer into bed with use of stool (height 35") with S to improve functional mobility at home. Target date: 03/10/16   Status Achieved           PT Long Term Goals - 03/04/16 1156    PT LONG TERM GOAL #1   Title Pt will ambulate 1000' over even/uneven terrain, with LRAD, at MOD I level to improve functional mobiliity. Target date: 02/07/16/17.   Baseline All unmet LTGs will be carried over to new 8 week POC: 04/11/16   Status On-going   PT LONG TERM GOAL #2   Title Pt will amb. 300' over even terrain without an AD, IND, to safely amb. in home. Target date: 04/11/16   Status On-going   PT LONG TERM GOAL #3   Title Pt will improve gait speed to >/=2.70f/sec, with LRAD, to amb. safely in the community. Target date: 11/12/14.   Status Achieved   PT LONG TERM GOAL #4   Title Pt will verbalize plans to join a fitness center upon d/c from PT to continue to maintain strength and endurance gains made during PT. Target date: 04/11/16.   Status On-going   PT LONG TERM GOAL #5   Title Pt improve BERG score to >/=55/56 to decr. falls risk. Target date: 12/11/15   Status Achieved   PT LONG TERM GOAL #6   Title Pt will ascend/descend curb with LRAD at MOD I level to improve functional mobility. Target date: 02/07/16   Status Achieved   PT LONG TERM GOAL #7   Title Pt will ascend/descend 12 steps no handrail at MOD I level, while carrying 20 lbs.l to improve functional mobility and carry child at home. Target date: 04/11/16   Status On-going   PT LONG TERM GOAL #8   Title Pt will ascend/descend ladder at MOD I level in order to transfer in/out of water. Target date: 04/11/16   Status On-going   PT LONG TERM GOAL  #9   TITLE Pt will be able to transfer on/off  boat at MOD I level, and  no LOB, in order improve functional mobility and participate in family activities. Target date: 04/11/16   Status On-going   PT LONG TERM GOAL  #10   TITLE Pt will be IIND in progressed HEP in order to improve balance, strength and flexibility. Target date: 02/07/16   Status Achieved   PT LONG TERM GOAL  #11   TITLE Pt will improve DGI score to >/=20/24 to decr. falls risk. Target date: 02/07/16   Status Achieved   PT LONG TERM GOAL  #12   TITLE Pt will improve FGA score to >/=25/30 to decr. falls risk. Target date: 04/11/16   Status On-going               Plan - 03/11/16 1637    Clinical Impression Statement Pt  had 3/5 of right hip abd and ext; poor control of pelvis during stance phase of gaits; see excessive lateral trunk mvt and right cirumducted gait pattern; 2/5 anterior tib on right; she was able to do the hip /core exs but will need consistency; she was also able to correct her gait pattern with UE support and mirror for visual feedback; ; on the way out she was able to improve her gait patern using a walking stick in her left hand - I recommended she continue this if possible to avoid gait deficit       Patient will benefit from skilled therapeutic intervention in order to improve the following deficits and impairments:     Visit Diagnosis: Poor trunk control  Abnormality of gait  Right leg weakness     Problem List Patient Active Problem List   Diagnosis Date Noted  . Spastic hemiplegia and hemiparesis affecting dominant side ([place]) 09/13/2015  . Adhesive capsulitis of right shoulder 08/24/2015  . Symptomatic partial epilepsy with simple partial seizures ([place]) 08/21/2015  . Nontraumatic cortical hemorrhage of cerebral hemisphere ([place])   . Seizure ([place])   . Seizures ([place])   . Adjustment disorder with depressed mood   . Contracture of muscle ankle and foot 06/11/2015  . Hemiplga fol ntrm intcrbl hemor aff right dominant side 06/06/2015  .  Left-sided intracerebral hemorrhage ([place]) 06/04/2015  . Seizure disorder as sequela of cerebrovascular accident ([place])   . Seizure disorder ([place]) 06/03/2015  . Sepsis ([place]) 06/03/2015  . UTI (urinary tract infection) 06/03/2015  . Hypotension 06/02/2015  . Right spastic hemiparesis ([place]) 05/28/2015  . 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 History of anxiety disorder 05/28/2015  . ICH (intracerebral hemorrhage) ([place])   . Seizure disorder, nonconvulsive, with status epilepticus ([place])   . Cerebral venous thrombosis of cortical vein   . Cytotoxic cerebral edema ([place])   . IVH (intraventricular hemorrhage) ([place])   . Term pregnancy 05/07/2015  . Spontaneous vaginal delivery 05/07/2015    Rosaura Carpenter D PT DPT 03/11/2016, 4:46 PM  [facility] [address] [place], [place], [address] Phone: [phone]   Fax:  [phone]  Name: Ashley [name] MRN: [id number] Date of Birth: 1976/09/15

## 2016-03-11 NOTE — Therapy (Signed)
[facility] [address] [place], [place], [address] Phone: [phone]   Fax:  [phone]  Occupational Therapy Treatment  Patient Details  Name: Ashley [name] MRN: [id number] Date of Birth: 1976/10/02 No Data Recorded  Encounter Date: 03/11/2016      OT End of Session - 03/11/16 1631    Visit Number 55   Number of Visits 84   Date for OT Re-Evaluation 04/08/16   Authorization Type BCBS   Authorization Time Period 30 visit COMBINED between PT/OT;  pt has utilized benefit and is now self pay.    OT Start Time 1315   OT Stop Time 1402   OT Time Calculation (min) 47 min      Past Medical History  Diagnosis Date  . Anxiety   . ICH (intracerebral hemorrhage) ([place])   . Stroke ([place])   . Seizures [facility])     Past Surgical History  Procedure Laterality Date  . Dilation and curettage of uterus      There were no vitals filed for this visit.      Subjective Assessment - 03/11/16 1322    Subjective  I took three showers by myself and felt safe.    Pertinent History see epic snapshot; pt with SAH 2 weeks after deliverying her 3rd child   Patient Stated Goals to be able to use my R arm normally   Currently in Pain? No/denies                      OT Treatments/Exercises (OP) - 03/11/16 0001    ADLs   ADL Comments Worked with pt to break down all aspects of previous job (pt worked in [employer]). Pt needed min vc's to identify various steps/skills/components of job as well as to express her areas of concern for return to work. ALso worked with pt to identify [place] areas for goals.                   OT Short Term Goals - 03/11/16 1626    OT SHORT TERM GOAL  #11   TITLE Pt will be mod I with upgraded HEP - 01/08/2016   Status Achieved   OT SHORT TERM GOAL  #12   TITLE Pt will be able to pick up 8 pound object x 4 trials bilaterally to place on overhead shelf requiring at  least 120* of shoulder flexion (for household mgmt tasks, grocery shopping ,etc)   Status Achieved   OT SHORT TERM GOAL  #13   TITLE Pt will be able to lift lightweight object unilaterally with RUE to 120* of shoulder flexion (baseline 115*)   Status Achieved  able to lift 4 pound object to this height with very minimal compensations   OT SHORT TERM GOAL  #14   TITLE Pt will be able to lift baby into and out of tub while in kneeling from and to baby seat using both UE's - 03/11/2016   Status Achieved  Pt sat on gma's lap and pt able to lift baby from gma's lap into tub and then lift baby out of tub and hand to gma            OT Long Term Goals - 03/11/16 1626    OT LONG TERM GOAL #1   Title Pt will demonstrate ability to carry 18 pound object for at least 600 feet during functional activity 04/08/2016   Status Achieved   OT LONG  TERM GOAL #2   Title Pt will be mod I with sweeping kitchen floor using both UE's   Status Achieved   OT LONG TERM GOAL #3   Title Pt will be mod I stepping over ledge of baby gate holding items in her hands   Status Achieved   OT LONG TERM GOAL #4   Title Pt will demonstrate abilty to turn wheel of care functional radius using both hands   Status Achieved   OT LONG TERM GOAL #5   Title Pt wil demonstrate ability to carry 10 pound object in RUE up/down stairs using railing   Status Achieved   OT LONG TERM GOAL #6   Title Pt will identify components/skills/tasks of potential return to work - 04/08/2016   Status New   OT LONG TERM GOAL #7   Title Pt will demonstrate ability to carry 5 pound object up and down stairs without railing   Status New   OT LONG TERM GOAL #8   Title 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 Pt would like to potential explore return to work as well as continue to address functional balance and RUE funcitonal use.  WIll renew pt and then review new goals/plan with pt. Pt in agreement.    Rehab Potential Good   Clinical Impairments Affecting Rehab Potential cognition, impaired sensation and anxiety   OT Frequency 2x / week   OT Duration 8 weeks   OT Treatment/Interventions Self-care/ADL training;Therapeutic exercise;Patient/family education;Balance training;Ultrasound;Neuromuscular education;Manual Therapy;Splinting;Therapeutic exercises;Energy conservation;Parrafin;DME and/or AE instruction;Therapeutic activities;Cognitive remediation/compensation;Gait Training;Fluidtherapy;Electrical Stimulation;Moist Heat;Contrast Bath;Passive range of motion;Visual/perceptual remediation/compensation   Plan review new goals, address balance in functional tasks.   Consulted and Agree with Plan of Care Patient      Patient will benefit from skilled therapeutic intervention in order to improve the following deficits and impairments:  Abnormal gait, Decreased coordination, Decreased range of motion, Difficulty walking, Impaired flexibility, Decreased endurance, Decreased safety awareness, Increased edema, Impaired tone, Decreased knowledge of precautions, Decreased activity tolerance, Decreased balance, Decreased knowledge of use of DME, Pain, Impaired UE functional use, Impaired vision/preception, Decreased cognition, Decreased mobility, Decreased strength  Visit Diagnosis: Spastic hemiplegia affecting right dominant side ([place]) - Plan: Ot plan of care cert/re-cert  Other abnormalities of gait and mobility - Plan: Ot plan of care cert/re-cert  Abnormal posture - Plan: Ot plan of care cert/re-cert  Frontal lobe and executive function deficit - Plan: Ot plan of care cert/re-cert  Other disturbances of skin sensation  - Plan: Ot plan of care cert/re-cert  Stiffness of right shoulder, not elsewhere classified - Plan: Ot plan of care cert/re-cert    Problem List Patient Active Problem List   Diagnosis Date Noted  . Spastic hemiplegia and hemiparesis affecting dominant side ([place]) 09/13/2015  . Adhesive capsulitis of right shoulder 08/24/2015  . Symptomatic partial epilepsy with simple partial seizures ([place]) 08/21/2015  . Nontraumatic cortical hemorrhage of cerebral hemisphere ([place])   . Seizure ([place])   . Seizures ([place])   . Adjustment  disorder with depressed mood   . Contracture of muscle ankle and foot 06/11/2015  . Hemiplga fol ntrm intcrbl hemor aff right dominant side 06/06/2015  . Left-sided intracerebral hemorrhage ([place]) 06/04/2015  . Seizure disorder as sequela of cerebrovascular accident ([place])   . Seizure disorder ([place]) 06/03/2015  . Sepsis ([place]) 06/03/2015  . UTI (urinary tract infection) 06/03/2015  . Hypotension 06/02/2015  . Right spastic hemiparesis ([place]) 05/28/2015  . 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 History of anxiety disorder 05/28/2015  . ICH (intracerebral hemorrhage) ([place])   . Seizure disorder, nonconvulsive, with status epilepticus ([place])   . Cerebral venous thrombosis of cortical vein   . Cytotoxic cerebral edema ([place])   . IVH (intraventricular hemorrhage) ([place])   . Term pregnancy 05/07/2015  . Spontaneous vaginal delivery 05/07/2015    [facility] 03/11/2016, 4:43 PM  [facility] [address] [place], [place], [address] Phone: [phone]   Fax:  [phone]  Name: Ashley [name] MRN: [id number] Date of Birth: Jul 11, 1976

## 2016-03-11 NOTE — Progress Notes (Signed)
[facility]  [address]   Telephone [phone] [address] Fax [phone] [place], [place] [address]   Psychology Progress Note   Name:  Ashley [name] Date of Birth:  30-Aug-1976 MRN:  [id number]  Date:03/11/2016 (94m) psychotherapy [name] reports being concerned about her reduced emotional control since her stroke manifested by crying more easily and saying things in anger that she would not have said prior to her stroke. Her reduced emotional control has been most evident (if not solely) around her mother and husband.She also spoke about having become "paranoid" (my terms: cautious and vigilant) about how she is perceived by others, especially about her mobility and cognitive competency. She is fearful of making an error, which was a pre-injury issue for her. She has contacted an OT Press photographer. She has ordered the book I suggested she read (memoir of a stroke survivor).      Diagnostic Impression Adjustment disorder with mixed anxiety and depressed mood [F43.23]  She is reporting typical adjustment concerns post-stroke. Any reduced emotional control appears to be mild. She continues to report and display problems with attention and memory. Her memory carryover from last session and weekly events seemed better.She continues to make progress towards increased independence.Dicussed finding a balance between being cautious/vigilant in order to avoid making an error versus being spontaneous and enjoying her situation.     Return in one week.   Jamey Ripa, Ph.D Licensed Psychologist

## 2016-03-13 ENCOUNTER — Encounter: Payer: Self-pay | Admitting: Occupational Therapy

## 2016-03-13 ENCOUNTER — Ambulatory Visit: Payer: BLUE CROSS/BLUE SHIELD | Admitting: Occupational Therapy

## 2016-03-13 ENCOUNTER — Ambulatory Visit: Payer: BLUE CROSS/BLUE SHIELD | Attending: Psychology

## 2016-03-13 DIAGNOSIS — R293 Abnormal posture: Secondary | ICD-10-CM

## 2016-03-13 DIAGNOSIS — R2689 Other abnormalities of gait and mobility: Secondary | ICD-10-CM

## 2016-03-13 DIAGNOSIS — F4323 Adjustment disorder with mixed anxiety and depressed mood: Secondary | ICD-10-CM | POA: Insufficient documentation

## 2016-03-13 DIAGNOSIS — G8111 Spastic hemiplegia affecting right dominant side: Secondary | ICD-10-CM

## 2016-03-13 DIAGNOSIS — R208 Other disturbances of skin sensation: Secondary | ICD-10-CM

## 2016-03-13 DIAGNOSIS — R41844 Frontal lobe and executive function deficit: Secondary | ICD-10-CM

## 2016-03-13 DIAGNOSIS — M25611 Stiffness of right shoulder, not elsewhere classified: Secondary | ICD-10-CM | POA: Insufficient documentation

## 2016-03-13 NOTE — Therapy (Signed)
[facility] [address] [place], [place], [address] Phone: [phone]   Fax:  [phone]  Occupational Therapy Treatment  Patient Details  Name: Ashley [name] MRN: [id number] Date of Birth: January 02, 1976 No Data Recorded  Encounter Date: 03/13/2016      OT End of Session - 03/13/16 1731    Visit Number 40   Number of Visits 71   Date for OT Re-Evaluation 04/08/16   Authorization Type BCBS   Authorization Time Period 30 visit COMBINED between PT/OT;  pt has utilized benefit and is now self pay.    OT Start Time 1446   OT Stop Time 1530   OT Time Calculation (min) 44 min   Activity Tolerance Patient tolerated treatment well      Past Medical History  Diagnosis Date  . Anxiety   . ICH (intracerebral hemorrhage) ([place])   . Stroke ([place])   . Seizures [facility])     Past Surgical History  Procedure Laterality Date  . Dilation and curettage of uterus      There were no vitals filed for this visit.      Subjective Assessment - 03/13/16 1454    Subjective  I finally found a brace I can wear with my flat shoes   Pertinent History see epic snapshot; pt with SAH 2 weeks after deliverying her 3rd child   Patient Stated Goals to be able to use my R arm normally   Currently in Pain? 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 Pt intially with unsteadiness on feet however with practice improved her ability to control cart and balance.                   OT Short Term Goals - 03/13/16 1729    OT SHORT TERM GOAL  #11   TITLE Pt will be mod I with upgraded HEP -  01/08/2016   Status Achieved   OT SHORT TERM GOAL  #12   TITLE Pt will be able to pick up 8 pound object x 4 trials bilaterally to place on overhead shelf requiring at least 120* of shoulder flexion (for household mgmt tasks, grocery shopping ,etc)   Status Achieved   OT SHORT TERM GOAL  #13   TITLE Pt will be able to lift lightweight object unilaterally with RUE to 120* of shoulder flexion (baseline 115*)   Status Achieved  able to lift 4 pound object to this height with very minimal compensations   OT SHORT TERM GOAL  #14   TITLE Pt will be able to lift baby into and out of tub while in kneeling from and to baby seat using both UE's - 03/11/2016   Status Achieved  Pt sat on gma's lap and pt able to lift baby from gma's lap into tub and then lift baby out of tub and hand to gma            OT Long Term Goals - 03/13/16 1729    OT LONG TERM GOAL #1   Title  Pt will demonstrate ability to carry 18 pound object for at least 600 feet during functional activity 04/08/2016   Status Achieved   OT LONG TERM GOAL #2   Title Pt will be mod I with sweeping kitchen floor using both UE's   Status Achieved   OT LONG TERM GOAL #3   Title Pt will be mod I stepping over ledge of baby gate holding items in her hands   Status Achieved   OT LONG TERM GOAL #4   Title Pt will demonstrate abilty to turn wheel of care functional radius using both hands   Status Achieved   OT LONG TERM GOAL #5   Title Pt wil demonstrate ability to carry 10 pound object in RUE up/down stairs using railing   Status Achieved   OT LONG TERM GOAL #6   Title Pt will identify components/skills/tasks of potential return to work - 04/08/2016   Status On-going   OT LONG TERM GOAL #7   Title Pt will demonstrate ability to carry 5 pound object up and down stairs without railing   Status On-going   OT LONG TERM GOAL #8   Title 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 Reviewed new LTG's with pt and pt in agreement.   Rehab Potential Good   Clinical Impairments Affecting Rehab Potential cognition, impaired sensation and anxiety   OT Frequency 2x / week   OT Duration 8 weeks   OT Treatment/Interventions Self-care/ADL training;Therapeutic exercise;Patient/family education;Balance training;Ultrasound;Neuromuscular education;Manual Therapy;Splinting;Therapeutic exercises;Energy conservation;Parrafin;DME and/or AE instruction;Therapeutic activities;Cognitive remediation/compensation;Gait Training;Fluidtherapy;Electrical Stimulation;Moist Heat;Contrast Bath;Passive range of motion;Visual/perceptual remediation/compensation   Plan NMR for overhead reach with strength, balance and trunk contorl in functioal tasks.   Consulted and Agree with Plan of Care Patient      Patient will benefit from skilled therapeutic intervention in order to improve the following deficits and impairments:  Abnormal gait, Decreased coordination, Decreased range of motion, Difficulty walking, Impaired flexibility, Decreased endurance, Decreased safety awareness, Increased edema, Impaired tone, Decreased knowledge of precautions, Decreased activity tolerance, Decreased balance, Decreased knowledge of use of DME, Pain, Impaired UE functional use, Impaired vision/preception, Decreased cognition, Decreased mobility, Decreased strength  Visit Diagnosis: Other abnormalities of gait and mobility  Spastic hemiplegia affecting right dominant side ([facility])  Abnormal posture  Frontal lobe and executive function deficit  Other disturbances of skin sensation  Stiffness of right shoulder, not elsewhere classified    Problem  List Patient Active Problem List   Diagnosis Date Noted  . Spastic hemiplegia and hemiparesis affecting dominant side ([place]) 09/13/2015  . Adhesive capsulitis of right shoulder 08/24/2015  . Symptomatic partial epilepsy with simple partial seizures ([place]) 08/21/2015  . Nontraumatic cortical hemorrhage of cerebral hemisphere ([place])   . Seizure ([place])   . Seizures ([place])   . Adjustment disorder with depressed mood   . Contracture of muscle ankle and foot 06/11/2015  . Hemiplga fol ntrm intcrbl hemor aff right dominant side 06/06/2015  . Left-sided intracerebral hemorrhage ([place]) 06/04/2015  . Seizure disorder as sequela of cerebrovascular accident ([place])   .  Seizure disorder ([place]) 06/03/2015  . Sepsis ([place]) 06/03/2015  . UTI (urinary tract infection) 06/03/2015  . Hypotension 06/02/2015  . Right spastic hemiparesis ([place]) 05/28/2015  . 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 History of anxiety disorder 05/28/2015  . ICH (intracerebral hemorrhage) ([place])   . Seizure disorder, nonconvulsive, with status epilepticus ([place])   . Cerebral venous thrombosis of cortical vein   . Cytotoxic cerebral edema ([place])   . IVH (intraventricular hemorrhage) ([place])   . Term pregnancy 05/07/2015  . Spontaneous vaginal delivery 05/07/2015    Quay Burow, OTR/L 03/13/2016, 5:32 PM  [facility] [address] [place], [place], [address] Phone: [phone]   Fax:  [phone]  Name: Ashley [name] MRN: [id number] Date of Birth: [birth year]

## 2016-03-13 NOTE — Therapy (Signed)
[facility] [address] [place], [place], [address] Phone: [phone]   Fax:  [phone]  Physical Therapy Treatment  Patient Details  Name: Ashley [name] MRN: [id number] Date of Birth: 1976-06-10 No Data Recorded  Encounter Date: 03/13/2016      PT End of Session - 03/13/16 1503    Visit Number 42   Number of Visits 32   Date for PT Re-Evaluation 04/11/16   Authorization Type BCBS. Per Laverda Page, visits now 30 for PT and OT total (15 per OT and 15 per PT). Pt wishes to continue at self pay after visit limit met.    Authorization - Visit Number 59   Authorization - Number of Visits 16   PT Start Time 3559   PT Stop Time 1445   PT Time Calculation (min) 42 min   Equipment Utilized During Treatment --  BWSTT harness and min guard to S   Activity Tolerance Patient tolerated treatment well   Behavior During Therapy [facility] for tasks assessed/performed      Past Medical History  Diagnosis Date  . Anxiety   . ICH (intracerebral hemorrhage) ([place])   . Stroke ([place])   . Seizures [facility])     Past Surgical History  Procedure Laterality Date  . Dilation and curettage of uterus      There were no vitals filed for this visit.      Subjective Assessment - 03/13/16 1406    Subjective Pt denied falls or changes since last visit.    Patient is accompained by: Family member  dtr-Brianna   Pertinent History Seizures, low BP   Patient Stated Goals "Be as close to normal as much as possible and to go back to running" Get back to taking care of my kids, 41 weeks old and 23 and 26 years old   Currently in Pain? No/denies                         [facility] Adult PT Treatment/Exercise - 03/13/16 1408    Ambulation/Gait   Ambulation/Gait Yes   Ambulation/Gait Assistance 5: Supervision   Ambulation/Gait Assistance Details Pt amb. with new Large ankle and medium shoeless foot up brace with cues to decr. speed to improve  R LE stance time. Pt performed BWSTT without R AFO donned for 2 bouts of 6-7 minutes, cues and manual facilitation to improve RLE stance time, R hip protraction and heel strike. Pt then amb. overground with cues.   Ambulation Distance (Feet) 230 Feet  with foot up brace, BWSTT (2x0.31m) and 300' no AFO   Assistive device None;Body weight support system   Gait Pattern Decreased stance time - right;Decreased weight shift to right;Right foot flat;Decreased hip/knee flexion - right;Step-through pattern;Decreased step length - left;Trunk rotated posteriorly on right   Ambulation Surface Level;Indoor                  PT Short Term Goals - 03/06/16 0903    PT SHORT TERM GOAL #1   Title Pt will be IND in HEP to improve strength, balance, and endurance. Target date: 01/10/16.   Status Achieved   PT SHORT TERM GOAL #2   Title Pt will improve gait speed to >/=1.888fsec to decrease falls risk. Target date: 07/27/15.   Status Achieved   PT SHORT TERM GOAL #3   Title Pt will ambulate 300' with LRAD, over even terrain, at MOD I level to improve functional mobility. Target  date: 07/27/15.   Status Achieved   PT SHORT TERM GOAL #4   Title Pt will perform TUG in </=13.5 seconds with LRAD to decr. falls risk. Target date: 01/10/15.   Status Achieved   PT SHORT TERM GOAL #5   Title Perform BERG and write STG and LTG as appropriate. Target date: 07/27/15.   Status Achieved   PT SHORT TERM GOAL #6   Title Pt will improve BERG to >/=51/56 to decr. falls risk. Target date: 07/27/15.   Status Achieved   PT SHORT TERM GOAL #7   Title Pt will amb. 500' over even/uneven terrain with LRAD at MOD I level to improve functional mobility. Target date: 01/10/16   Status Partially Met   PT SHORT TERM GOAL #8   Title Pt will ascend/descend curb with LRAD at MOD I level to improve functional mobility. Target date: 01/10/16   Status Partially Met   PT SHORT TERM GOAL #9   TITLE Pt will ascend/descend 12 steps  with one handrail at MOD I level to improve functional mobility. Target date: 01/10/16   Status Achieved   PT SHORT TERM GOAL #10   TITLE Pt will incr. FGA score to >/=20/24 to decr. falls risk. Target date: 03/10/16   Status Partially Met   PT SHORT TERM GOAL #11   TITLE Pt will dance for 5 minutes with S and no LOB  to dance with family. Target date: 03/10/16   Status Partially Met   PT SHORT TERM GOAL #12   TITLE Pt will jump x5 reps with S to play with children at home. Target date: 03/10/16   Status Achieved   PT SHORT TERM GOAL #13   TITLE Pt will transfer into bed with use of stool (height 35") with S to improve functional mobility at home. Target date: 03/10/16   Status Achieved           PT Long Term Goals - 03/04/16 1156    PT LONG TERM GOAL #1   Title Pt will ambulate 1000' over even/uneven terrain, with LRAD, at MOD I level to improve functional mobiliity. Target date: 02/07/16/17.   Baseline All unmet LTGs will be carried over to new 8 week POC: 04/11/16   Status On-going   PT LONG TERM GOAL #2   Title Pt will amb. 300' over even terrain without an AD, IND, to safely amb. in home. Target date: 04/11/16   Status On-going   PT LONG TERM GOAL #3   Title Pt will improve gait speed to >/=2.3f/sec, with LRAD, to amb. safely in the community. Target date: 11/12/14.   Status Achieved   PT LONG TERM GOAL #4   Title Pt will verbalize plans to join a fitness center upon d/c from PT to continue to maintain strength and endurance gains made during PT. Target date: 04/11/16.   Status On-going   PT LONG TERM GOAL #5   Title Pt improve BERG score to >/=55/56 to decr. falls risk. Target date: 12/11/15   Status Achieved   PT LONG TERM GOAL #6   Title Pt will ascend/descend curb with LRAD at MOD I level to improve functional mobility. Target date: 02/07/16   Status Achieved   PT LONG TERM GOAL #7   Title Pt will ascend/descend 12 steps no handrail at MOD I level, while carrying 20 lbs.l to  improve functional mobility and carry child at home. Target date: 04/11/16   Status On-going   PT LONG TERM GOAL #8  Title Pt will ascend/descend ladder at MOD I level in order to transfer in/out of water. Target date: 04/11/16   Status On-going   PT LONG TERM GOAL  #9   TITLE Pt will be able to transfer on/off boat at MOD I level, and no LOB, in order improve functional mobility and participate in family activities. Target date: 04/11/16   Status On-going   PT LONG TERM GOAL  #10   TITLE Pt will be IIND in progressed HEP in order to improve balance, strength and flexibility. Target date: 02/07/16   Status Achieved   PT LONG TERM GOAL  #11   TITLE Pt will improve DGI score to >/=20/24 to decr. falls risk. Target date: 02/07/16   Status Achieved   PT LONG TERM GOAL  #12   TITLE Pt will improve FGA score to >/=25/30 to decr. falls risk. Target date: 04/11/16   Status On-going               Plan - 03/13/16 1503    Clinical Impression Statement Pt demonstrated progress as she was able to amb. at faster pace without AFO donned during BWSTT (1.64mh). Pt continues to require cues to improve R hip protraction, R eccentric quad control during swing phase, and stride length. Pt would continue to benefit from skilled PT to improve safety during functional mobility.   Rehab Potential Good   Clinical Impairments Affecting Rehab Potential Seizures which may limit intensity of PT   PT Frequency 2x / week   PT Duration 8 weeks   PT Treatment/Interventions ADLs/Self Care Home Management;Neuromuscular re-education;Cognitive remediation;Biofeedback;DME Instruction;Gait training;Stair training;Canalith Repostioning;Patient/family education;Orthotic Fit/Training;Balance training;Therapeutic exercise;Manual techniques;Therapeutic activities;Vestibular   PT Next Visit Plan R SLS, dance, pelvic tilts/clocks/rotation on physioball, L eccentric quad control   PT Home Exercise Plan Stretching/strength/balance HEP    Consulted and Agree with Plan of Care Patient      Patient will benefit from skilled therapeutic intervention in order to improve the following deficits and impairments:  Abnormal gait, Decreased endurance, Impaired sensation, Decreased knowledge of precautions, Decreased activity tolerance, Decreased knowledge of use of DME, Decreased strength, Impaired UE functional use, Impaired tone, Decreased balance, Decreased mobility, Decreased cognition, Decreased range of motion, Decreased safety awareness, Decreased coordination, Impaired flexibility, Postural dysfunction  Visit Diagnosis: Other abnormalities of gait and mobility  Spastic hemiplegia affecting right dominant side ([facility]     Problem List Patient Active Problem List   Diagnosis Date Noted  . Spastic hemiplegia and hemiparesis affecting dominant side ([facility] 09/13/2015  . Adhesive capsulitis of right shoulder 08/24/2015  . Symptomatic partial epilepsy with simple partial seizures ([place] 08/21/2015  . Nontraumatic cortical hemorrhage of cerebral hemisphere ([place]   . Seizure ([facility]   . Seizures ([place]   . Adjustment disorder with depressed mood   . Contracture of muscle ankle and foot 06/11/2015  . Hemiplga fol ntrm intcrbl hemor aff right dominant side 06/06/2015  . Left-sided intracerebral hemorrhage ([facility] 06/04/2015  . Seizure disorder as sequela of cerebrovascular accident ([facility]   . Seizure disorder ([facility] 06/03/2015  . Sepsis ([facility] 06/03/2015  . UTI (urinary tract infection) 06/03/2015  . Hypotension 06/02/2015  . Right spastic hemiparesis ([facility] [place] 05/28/2015  . 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 History of anxiety disorder 05/28/2015  . ICH (intracerebral hemorrhage) ([place]   . Seizure disorder, nonconvulsive, with status epilepticus ([facility]   . Cerebral venous thrombosis of cortical vein   . Cytotoxic cerebral edema ([facility]   . IVH (intraventricular hemorrhage) (  [place])   . Term pregnancy  05/07/2015  . Spontaneous vaginal delivery 05/07/2015    , L 03/13/2016, 3:09 PM  [facility] [address] [place], [place], [address] Phone: [phone]   Fax:  [phone]  Name: Leialoha Hanna MRN: [id number] Date of Birth: 05/23/1976    Geoffry Paradise, PT,DPT 03/13/2016 3:09 PM Phone: [phone] Fax: [phone]

## 2016-03-17 ENCOUNTER — Encounter: Payer: BLUE CROSS/BLUE SHIELD | Attending: Physical Medicine & Rehabilitation

## 2016-03-17 ENCOUNTER — Ambulatory Visit: Payer: BLUE CROSS/BLUE SHIELD | Admitting: Occupational Therapy

## 2016-03-17 ENCOUNTER — Ambulatory Visit (HOSPITAL_BASED_OUTPATIENT_CLINIC_OR_DEPARTMENT_OTHER): Payer: BLUE CROSS/BLUE SHIELD | Admitting: Physical Medicine & Rehabilitation

## 2016-03-17 ENCOUNTER — Encounter: Payer: Self-pay | Admitting: Physical Medicine & Rehabilitation

## 2016-03-17 VITALS — BP 106/63 | HR 80 | Resp 14

## 2016-03-17 VITALS — BP 98/57 | HR 82

## 2016-03-17 DIAGNOSIS — R293 Abnormal posture: Secondary | ICD-10-CM

## 2016-03-17 DIAGNOSIS — I69120 Aphasia following nontraumatic intracerebral hemorrhage: Secondary | ICD-10-CM | POA: Diagnosis not present

## 2016-03-17 DIAGNOSIS — G8111 Spastic hemiplegia affecting right dominant side: Secondary | ICD-10-CM

## 2016-03-17 DIAGNOSIS — I69151 Hemiplegia and hemiparesis following nontraumatic intracerebral hemorrhage affecting right dominant side: Secondary | ICD-10-CM | POA: Insufficient documentation

## 2016-03-17 DIAGNOSIS — F419 Anxiety disorder, unspecified: Secondary | ICD-10-CM | POA: Diagnosis not present

## 2016-03-17 DIAGNOSIS — G8110 Spastic hemiplegia affecting unspecified side: Secondary | ICD-10-CM | POA: Diagnosis not present

## 2016-03-17 DIAGNOSIS — R2689 Other abnormalities of gait and mobility: Secondary | ICD-10-CM

## 2016-03-17 DIAGNOSIS — Z79899 Other long term (current) drug therapy: Secondary | ICD-10-CM | POA: Diagnosis not present

## 2016-03-17 DIAGNOSIS — R208 Other disturbances of skin sensation: Secondary | ICD-10-CM

## 2016-03-17 DIAGNOSIS — R41844 Frontal lobe and executive function deficit: Secondary | ICD-10-CM

## 2016-03-17 DIAGNOSIS — M25611 Stiffness of right shoulder, not elsewhere classified: Secondary | ICD-10-CM

## 2016-03-17 NOTE — Patient Instructions (Addendum)
Botox injection into the following muscles  Gastrosoleus 50 units medial and 50 units lateral Soleus 25 units 3 Posterior tibialis 50 units 3 Flexor digitorum longus 25 units 3  You received a Botox injection today. You may experience soreness at the needle injection sites. Please call us if any of the injection sites turns red after a couple days or if there is any drainage. You may experience muscle weakness as a result of Botox. This would improve with time but can take several weeks to improve. The Botox should start working in about one week. The Botox usually last 3 months. The injection can be repeated every 3 months as needed.

## 2016-03-17 NOTE — Therapy (Signed)
[facility] [address] [place], [place], [address] Phone: [phone]   Fax:  [phone]  Occupational Therapy Treatment  Patient Details  Name: Ashley [name] MRN: [id number] Date of Birth: 08-15-1976 No Data Recorded  Encounter Date: 03/17/2016      OT End of Session - 03/17/16 1757    Visit Number 41   Number of Visits 32   Date for OT Re-Evaluation 04/08/16   Authorization Type BCBS   Authorization Time Period 30 visit COMBINED between PT/OT;  pt has utilized benefit and is now self pay.    OT Start Time 1447   OT Stop Time 1530   OT Time Calculation (min) 43 min   Activity Tolerance Patient tolerated treatment well      Past Medical History  Diagnosis Date  . Anxiety   . ICH (intracerebral hemorrhage) ([place])   . Stroke ([place])   . Seizures [facility])     Past Surgical History  Procedure Laterality Date  . Dilation and curettage of uterus      Filed Vitals:   03/17/16 1457  BP: 98/57  Pulse: 82        Subjective Assessment - 03/17/16 1455    Subjective  I had another botox injection in my leg and I got lightheaded afterward   Pertinent History see epic snapshot; pt with [facility] 2 weeks after deliverying her 3rd child   Patient Stated Goals to be able to use my R arm normally   Currently in Pain? No/denies                      OT Treatments/Exercises (OP) - 03/17/16 0001    Neurological Re-education Exercises   Other Exercises 1 Neuro re ed to address RUE overhead reach with resistance and lifting weighted balls to address increasing strength and stability in LUE with overhead activities. Pt with some flexor synergey emerging with resistance/increased weight with overhead tasks.  Also addressed dynamic balance, core control , weight shifting, and maintianing weight on LLE for longer periods of time to work toward goal of going up and down stairs without railing while carrying object.                    OT Short Term Goals - 03/17/16 1755    OT SHORT TERM GOAL  #11   TITLE Pt will be mod I with upgraded HEP - 01/08/2016   Status Achieved   OT SHORT TERM GOAL  #12   TITLE Pt will be able to pick up 8 pound object x 4 trials bilaterally to place on overhead shelf requiring at least 120* of shoulder flexion (for household mgmt tasks, grocery shopping ,etc)   Status Achieved   OT SHORT TERM GOAL  #13   TITLE Pt will be able to lift lightweight object unilaterally with RUE to 120* of shoulder flexion (baseline 115*)   Status Achieved  able to lift 4 pound object to this height with very minimal compensations   OT SHORT TERM GOAL  #14   TITLE Pt will be able to lift baby into and out of tub while in kneeling from and to baby seat using both UE's - 03/11/2016   Status Achieved  Pt sat on gma's lap and pt able to lift baby from gma's lap into tub and then lift baby out of tub and hand to [place]            OT  Long Term Goals - 03/17/16 1755    OT LONG TERM GOAL #1   Title Pt will demonstrate ability to carry 18 pound object for at least 600 feet during functional activity 04/08/2016   Status Achieved   OT LONG TERM GOAL #2   Title Pt will be mod I with sweeping kitchen floor using both UE's   Status Achieved   OT LONG TERM GOAL #3   Title Pt will be mod I stepping over ledge of baby gate holding items in her hands   Status Achieved   OT LONG TERM GOAL #4   Title Pt will demonstrate abilty to turn wheel of care functional radius using both hands   Status Achieved   OT LONG TERM GOAL #5   Title Pt wil demonstrate ability to carry 10 pound object in RUE up/down stairs using railing   Status Achieved   OT LONG TERM GOAL #6   Title Pt will identify components/skills/tasks of potential return to work - 04/08/2016   Status On-going   OT LONG TERM GOAL #7   Title Pt will demonstrate ability to carry 5 pound object up and down stairs without railing   Status On-going    OT LONG TERM GOAL #8   Title Pt will demonstrate 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 Pt with continued improvement in RUE use as well as functional balance.   Rehab Potential Good   Clinical Impairments Affecting Rehab Potential cognition, impaired sensation and anxiety   OT Frequency 2x / week   OT Duration 8 weeks   OT Treatment/Interventions Self-care/ADL training;Therapeutic exercise;Patient/family education;Balance training;Ultrasound;Neuromuscular education;Manual Therapy;Splinting;Therapeutic exercises;Energy conservation;Parrafin;DME and/or AE instruction;Therapeutic activities;Cognitive remediation/compensation;Gait Training;Fluidtherapy;Electrical Stimulation;Moist Heat;Contrast Bath;Passive range of motion;Visual/perceptual remediation/compensation   Plan NMR for overhead reach with strength, balance and trunk control in functional tasks.   Consulted and Agree with Plan of Care Patient      Patient will benefit from skilled therapeutic intervention in order to improve the following deficits and impairments:  Abnormal gait, Decreased coordination, Decreased range of motion, Difficulty walking, Impaired flexibility, Decreased endurance, Decreased safety awareness, Increased edema, Impaired tone, Decreased knowledge of precautions, Decreased activity tolerance, Decreased balance, Decreased knowledge of use of DME, Pain, Impaired UE functional use, Impaired vision/preception, Decreased cognition, Decreased mobility, Decreased strength  Visit Diagnosis: Other abnormalities of gait and mobility  Spastic hemiplegia affecting right dominant side (H[facility])  Abnormal posture  Frontal lobe and  executive function deficit  Other disturbances of skin sensation  Stiffness of right shoulder, not elsewhere classified    Problem List Patient Active Problem List   Diagnosis Date Noted  . Spastic hemiplegia and hemiparesis affecting dominant side ([place]) 09/13/2015  . Adhesive capsulitis of right shoulder 08/24/2015  . Symptomatic partial epilepsy with simple partial seizures ([place]) 08/21/2015  . Nontraumatic cortical hemorrhage of cerebral hemisphere ([place])   . Seizure ([place])   . Seizures ([place])   . Adjustment disorder with depressed mood   . Contracture of muscle ankle and foot 06/11/2015  . Hemiplga fol ntrm intcrbl hemor aff right dominant side 06/06/2015  . Left-sided intracerebral  hemorrhage ([place]) 06/04/2015  . Seizure disorder as sequela of cerebrovascular accident ([place])   . Seizure disorder ([place]) 06/03/2015  . Sepsis ([place]) 06/03/2015  . UTI (urinary tract infection) 06/03/2015  . Hypotension 06/02/2015  . Right spastic hemiparesis ([place]) 05/28/2015  . 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 History of anxiety disorder 05/28/2015  . ICH (intracerebral hemorrhage) ([place])   . Seizure disorder, nonconvulsive, with status epilepticus ([place])   . Cerebral venous thrombosis of cortical vein   . Cytotoxic cerebral edema ([place])   . IVH (intraventricular hemorrhage) ([place])   . Term pregnancy 05/07/2015  . Spontaneous vaginal delivery 05/07/2015    Quay Burow , OTR/L  03/17/2016, 5:59 PM  [facility] [address] [place], [place], [address] Phone: [phone]   Fax:  [phone]  Name: Ashley [name] MRN: [id number] Date of Birth: [birth year]

## 2016-03-17 NOTE — Progress Notes (Signed)
Botox Injection for spasticity using needle EMG guidance  Dilution: 50 Units/ml Indication: Severe spasticity which interferes with ADL,mobility and/or  hygiene and is unresponsive to medication management and other conservative care Informed consent was obtained after describing risks and benefits of the procedure with the patient. This includes bleeding, bruising, infection, excessive weakness, or medication side effects. A REMS form is on file and signed. Needle: 44mm 25g needle electrode Number of units per muscle Gastrosoleus 50 units medial and 50 units lateral Soleus 25 units 3 Posterior tibialis 50 units 3 Flexor digitorum longus 25 units 3 All injections were done after obtaining appropriate EMG activity and after negative drawback for blood. The patient tolerated the procedure well. Post procedure instructions were given. A followup appointment was made.

## 2016-03-18 ENCOUNTER — Encounter: Payer: Self-pay | Admitting: Occupational Therapy

## 2016-03-18 ENCOUNTER — Ambulatory Visit: Payer: BLUE CROSS/BLUE SHIELD | Admitting: Occupational Therapy

## 2016-03-18 ENCOUNTER — Ambulatory Visit: Payer: BLUE CROSS/BLUE SHIELD

## 2016-03-18 ENCOUNTER — Ambulatory Visit (INDEPENDENT_AMBULATORY_CARE_PROVIDER_SITE_OTHER): Payer: BLUE CROSS/BLUE SHIELD | Admitting: Psychology

## 2016-03-18 DIAGNOSIS — R208 Other disturbances of skin sensation: Secondary | ICD-10-CM

## 2016-03-18 DIAGNOSIS — R41844 Frontal lobe and executive function deficit: Secondary | ICD-10-CM

## 2016-03-18 DIAGNOSIS — F4323 Adjustment disorder with mixed anxiety and depressed mood: Secondary | ICD-10-CM

## 2016-03-18 DIAGNOSIS — R2689 Other abnormalities of gait and mobility: Secondary | ICD-10-CM | POA: Diagnosis not present

## 2016-03-18 DIAGNOSIS — G8111 Spastic hemiplegia affecting right dominant side: Secondary | ICD-10-CM

## 2016-03-18 DIAGNOSIS — R293 Abnormal posture: Secondary | ICD-10-CM

## 2016-03-18 DIAGNOSIS — M25611 Stiffness of right shoulder, not elsewhere classified: Secondary | ICD-10-CM

## 2016-03-18 NOTE — Progress Notes (Signed)
[facility]  [address]   Telephone [phone] [address] Fax [phone] [place], [place] [address]   Psychology Progress Note   Name:  Ashley [name] Date of Birth:  1976-08-14 MRN:  [id number]  Date:03/18/2016 (29m) psychotherapy She reported experiencing much stress from receiving multiple phone calls from her ex-husband while he is apparently intoxicated with the recurring theme of wanting to reunite with her. She is trying to set limits with him but needs to maintain contract for co-parenting. Her husband and his wife are aware of the situation.   She reports being pleased with her progress in rehabilitation. She reported that she hopes to be able to return to work in 2018.    Diagnostic Impression Adjustment disorder with anxiety and depressed mood [F43.23]  Discussed with her that in light of her post-stroke attentional and memory deficits, I would recommend that she first undergo neuropsychological evaluation prior to going back to work.    Return in one week.   Jamey Ripa, Ph.D Licensed Psychologist

## 2016-03-18 NOTE — Therapy (Signed)
[facility] [address] [place], [place], [address] Phone: [phone]   Fax:  [phone]  Physical Therapy Treatment  Patient Details  Name: Ashley [name] MRN: [id number] Date of Birth: 08/03/1976 No Data Recorded  Encounter Date: 03/18/2016      PT End of Session - 03/18/16 1309    Visit Number 84   Number of Visits 48   Date for PT Re-Evaluation 04/11/16   Authorization Type BCBS. Per Laverda Page, visits now 30 for PT and OT total (15 per OT and 15 per PT). Pt wishes to continue at self pay after visit limit met.    Authorization - Visit Number 41   Authorization - Number of Visits 16   PT Start Time 1016   PT Stop Time 1058   PT Time Calculation (min) 42 min   Equipment Utilized During Treatment Gait belt   Activity Tolerance Patient tolerated treatment well   Behavior During Therapy WFL for tasks assessed/performed      Past Medical History  Diagnosis Date  . Anxiety   . ICH (intracerebral hemorrhage) ([place])   . Stroke ([place])   . Seizures [facility])     Past Surgical History  Procedure Laterality Date  . Dilation and curettage of uterus      There were no vitals filed for this visit.      Subjective Assessment - 03/18/16 1020    Subjective Pt reported she had botox in RLE yesterday, and she became lightheaded but vitals were WNL.  Pt reported she didn't wear her R AFO all weekend and didn't have any falls, but was more cautious when walking.    Pertinent History Seizures, low BP   Patient Stated Goals "Be as close to normal as much as possible and to go back to running" Get back to taking care of my kids, 53 weeks old and 32 and 75 years old   Currently in Pain? No/denies                         [facility] Adult PT Treatment/Exercise - 03/18/16 1044    Exercises   Exercises Knee/Hip   Knee/Hip Exercises: Standing   Step Down 3 sets;10 reps;Step Height: 4";Hand Hold: 1   Step Down Limitations  Cues for technique, B LEs (3x10/RLE and x10/LLE). R AFO not donned.   Knee/Hip Exercises: Seated   Stool Scoot - Round Trips Performed to improve R hamstring strength. 115' around level track, S for safety and cues for technique, no AFO donned.             Balance Exercises - 03/18/16 1023    Balance Exercises: Standing   SLS Solid surface;5 reps;Eyes open;10 secs  cues to engage glute musculature, performed with BLE   Balance Exercises: Seated   Other Seated Exercises While seated on physioball pt performed ball toss/catch x10 reps with feet apart, pt also performed pelvic tilts/clocks/rotation, LAQs, B shoulder flexion, B hip marches, then hip marches with contralateral shoulder flex. x10/activity. Min guard to min A (only during B hip marchc shldr flex) required to maintain balance. Cues for upright posture and to engage abdominal muscles to improve balance.             PT Short Term Goals - 03/06/16 0903    PT SHORT TERM GOAL #1   Title Pt will be IND in HEP to improve strength, balance, and endurance. Target date: 01/10/16.   Status  Achieved   PT SHORT TERM GOAL #2   Title Pt will improve gait speed to >/=1.4f/sec to decrease falls risk. Target date: 07/27/15.   Status Achieved   PT SHORT TERM GOAL #3   Title Pt will ambulate 300' with LRAD, over even terrain, at MOD I level to improve functional mobility. Target date: 07/27/15.   Status Achieved   PT SHORT TERM GOAL #4   Title Pt will perform TUG in </=13.5 seconds with LRAD to decr. falls risk. Target date: 01/10/15.   Status Achieved   PT SHORT TERM GOAL #5   Title Perform BERG and write STG and LTG as appropriate. Target date: 07/27/15.   Status Achieved   PT SHORT TERM GOAL #6   Title Pt will improve BERG to >/=51/56 to decr. falls risk. Target date: 07/27/15.   Status Achieved   PT SHORT TERM GOAL #7   Title Pt will amb. 500' over even/uneven terrain with LRAD at MOD I level to improve functional mobility.  Target date: 01/10/16   Status Partially Met   PT SHORT TERM GOAL #8   Title Pt will ascend/descend curb with LRAD at MOD I level to improve functional mobility. Target date: 01/10/16   Status Partially Met   PT SHORT TERM GOAL #9   TITLE Pt will ascend/descend 12 steps with one handrail at MOD I level to improve functional mobility. Target date: 01/10/16   Status Achieved   PT SHORT TERM GOAL #10   TITLE Pt will incr. FGA score to >/=20/24 to decr. falls risk. Target date: 03/10/16   Status Partially Met   PT SHORT TERM GOAL #11   TITLE Pt will dance for 5 minutes with S and no LOB  to dance with family. Target date: 03/10/16   Status Partially Met   PT SHORT TERM GOAL #12   TITLE Pt will jump x5 reps with S to play with children at home. Target date: 03/10/16   Status Achieved   PT SHORT TERM GOAL #13   TITLE Pt will transfer into bed with use of stool (height 35") with S to improve functional mobility at home. Target date: 03/10/16   Status Achieved           PT Long Term Goals - 03/04/16 1156    PT LONG TERM GOAL #1   Title Pt will ambulate 1000' over even/uneven terrain, with LRAD, at MOD I level to improve functional mobiliity. Target date: 02/07/16/17.   Baseline All unmet LTGs will be carried over to new 8 week POC: 04/11/16   Status On-going   PT LONG TERM GOAL #2   Title Pt will amb. 300' over even terrain without an AD, IND, to safely amb. in home. Target date: 04/11/16   Status On-going   PT LONG TERM GOAL #3   Title Pt will improve gait speed to >/=2.666fsec, with LRAD, to amb. safely in the community. Target date: 11/12/14.   Status Achieved   PT LONG TERM GOAL #4   Title Pt will verbalize plans to join a fitness center upon d/c from PT to continue to maintain strength and endurance gains made during PT. Target date: 04/11/16.   Status On-going   PT LONG TERM GOAL #5   Title Pt improve BERG score to >/=55/56 to decr. falls risk. Target date: 12/11/15   Status Achieved    PT LONG TERM GOAL #6   Title Pt will ascend/descend curb with LRAD at MOD I level to improve  functional mobility. Target date: 02/07/16   Status Achieved   PT LONG TERM GOAL #7   Title Pt will ascend/descend 12 steps no handrail at MOD I level, while carrying 20 lbs.l to improve functional mobility and carry child at home. Target date: 04/11/16   Status On-going   PT LONG TERM GOAL #8   Title Pt will ascend/descend ladder at MOD I level in order to transfer in/out of water. Target date: 04/11/16   Status On-going   PT LONG TERM GOAL  #9   TITLE Pt will be able to transfer on/off boat at MOD I level, and no LOB, in order improve functional mobility and participate in family activities. Target date: 04/11/16   Status On-going   PT LONG TERM GOAL  #10   TITLE Pt will be IIND in progressed HEP in order to improve balance, strength and flexibility. Target date: 02/07/16   Status Achieved   PT LONG TERM GOAL  #11   TITLE Pt will improve DGI score to >/=20/24 to decr. falls risk. Target date: 02/07/16   Status Achieved   PT LONG TERM GOAL  #12   TITLE Pt will improve FGA score to >/=25/30 to decr. falls risk. Target date: 04/11/16   Status On-going               Plan - 03/18/16 1309    Clinical Impression Statement Pt demonstrated progress as she was able to isolate pelvic movements without engaging trunk musculature while performing balance activities on physioball. Pt also able to perform stool scoot for longer distances, demonstrating improved hamstring strength. Pt continues to experience difficulty performing R SLS and R eccentric quad control activities. Continue with POC.    Rehab Potential Good   Clinical Impairments Affecting Rehab Potential Seizures which may limit intensity of PT   PT Frequency 2x / week   PT Duration 8 weeks   PT Treatment/Interventions ADLs/Self Care Home Management;Neuromuscular re-education;Cognitive remediation;Biofeedback;DME Instruction;Gait training;Stair  training;Canalith Repostioning;Patient/family education;Orthotic Fit/Training;Balance training;Therapeutic exercise;Manual techniques;Therapeutic activities;Vestibular   PT Next Visit Plan R SLS, dance   PT Home Exercise Plan Stretching/strength/balance HEP   Consulted and Agree with Plan of Care Patient      Patient will benefit from skilled therapeutic intervention in order to improve the following deficits and impairments:  Abnormal gait, Decreased endurance, Impaired sensation, Decreased knowledge of precautions, Decreased activity tolerance, Decreased knowledge of use of DME, Decreased strength, Impaired UE functional use, Impaired tone, Decreased balance, Decreased mobility, Decreased cognition, Decreased range of motion, Decreased safety awareness, Decreased coordination, Impaired flexibility, Postural dysfunction  Visit Diagnosis: Other abnormalities of gait and mobility  Spastic hemiplegia affecting right dominant side [facility])     Problem List Patient Active Problem List   Diagnosis Date Noted  . Spastic hemiplegia and hemiparesis affecting dominant side ([place]) 09/13/2015  . Adhesive capsulitis of right shoulder 08/24/2015  . Symptomatic partial epilepsy with simple partial seizures ([place]) 08/21/2015  . Nontraumatic cortical hemorrhage of cerebral hemisphere ([place])   . Seizure ([place])   . Seizures ([place])   . Adjustment disorder with depressed mood   . Contracture of muscle ankle and foot 06/11/2015  . Hemiplga fol ntrm intcrbl hemor aff right dominant side 06/06/2015  . Left-sided intracerebral hemorrhage ([place]) 06/04/2015  . Seizure disorder as sequela of cerebrovascular accident ([place])   . Seizure disorder ([place]) 06/03/2015  . Sepsis ([place]) 06/03/2015  . UTI (urinary tract infection) 06/03/2015  . Hypotension 06/02/2015  . Right spastic hemiparesis ([place]) 05/28/2015  .  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 History of anxiety disorder 05/28/2015  .  ICH (intracerebral hemorrhage) ([place])   . Seizure disorder, nonconvulsive, with status epilepticus ([place])   . Cerebral venous thrombosis of cortical vein   . Cytotoxic cerebral edema ([place])   . IVH (intraventricular hemorrhage) ([place])   . Term pregnancy 05/07/2015  . Spontaneous vaginal delivery 05/07/2015    Camyla Camposano L 03/18/2016, 1:12 PM  [facility] [address] [place], [place], [address] Phone: [phone]   Fax:  [phone]  Name: Ashley [name] MRN: [id number] Date of Birth: 1976-02-09    Geoffry Paradise, PT,DPT 03/18/2016 1:12 PM Phone: [phone] Fax: [phone]

## 2016-03-18 NOTE — Therapy (Signed)
[facility] [address] [place], [place], [address] Phone: [phone]   Fax:  [phone]  Occupational Therapy Treatment  Patient Details  Name: Ashley [name] MRN: [id number] Date of Birth: 12-15-1975 No Data Recorded  Encounter Date: 03/18/2016      OT End of Session - 03/18/16 1059    Visit Number 42   Number of Visits 68   Date for OT Re-Evaluation 04/08/16   Authorization Type BCBS   Authorization Time Period 30 visit COMBINED between PT/OT;  pt has utilized benefit and is now self pay.    OT Start Time 0932   OT Stop Time 1017   OT Time Calculation (min) 45 min   Activity Tolerance Patient tolerated treatment well      Past Medical History  Diagnosis Date  . Anxiety   . ICH (intracerebral hemorrhage) ([place])   . Stroke ([place])   . Seizures [facility])     Past Surgical History  Procedure Laterality Date  . Dilation and curettage of uterus      There were no vitals filed for this visit.      Subjective Assessment - 03/18/16 0935    Subjective  I feel fine today   Pertinent History see epic snapshot; pt with SAH 2 weeks after deliverying her 3rd child   Patient Stated Goals to be able to use my R arm normally   Currently in Pain? 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 Pt able to push, pull and manuever cart on wheels with 220 pounds today and able to sustain activity for 30 minutes without rest break. Emphasis placed on balanced, core strength, increasing weight bearing on RLE, power/strength in RUE. Pt required vc's to maintain alignment and maintain weight bearing on LLE with increased task demand.   Neurological Re-education Exercises   Other Exercises 1 Neuro re ed via functional tasks to address trunk control, RUE functional use  with resistive activities, balance and increasing weight bearing on RLE in standing. Pt able to unload multiple weights from cart on wheels to weight stand.             Balance Exercises - 03/18/16 1023    Balance Exercises: Standing   SLS Solid surface;5 reps;Eyes open;10 secs  cues to engage glute musculature   Balance Exercises: Seated   Other Seated Exercises While seated on physioball pt performed ball toss/catch x50 reps with feet apart and together, pt also performed pelvic tilts/clocks/rotation, LAQs, B shoulder flexion, B hip marches, then hip marches with contralateral shoulder flex. x10/activity. Min guard to min A (only during B hip marchc shldr flex) required to maintain balance. Cues for upright posture and to engage abdominal muscles to improve balance.             OT Short Term Goals - 03/18/16 1056    OT SHORT TERM GOAL  #11   TITLE Pt will be mod I with upgraded HEP - 01/08/2016   Status Achieved   OT SHORT TERM GOAL  #12   TITLE Pt will be able to pick up 8 pound object x 4 trials bilaterally to place on overhead shelf requiring at least 120* of shoulder flexion (for household mgmt tasks,  grocery shopping ,etc)   Status Achieved   OT SHORT TERM GOAL  #13   TITLE Pt will be able to lift lightweight object unilaterally with RUE to 120* of shoulder flexion (baseline 115*)   Status Achieved  able to lift 4 pound object to this height with very minimal compensations   OT SHORT TERM GOAL  #14   TITLE Pt will be able to lift baby into and out of tub while in kneeling from and to baby seat using both UE's - 03/11/2016   Status Achieved  Pt sat on gma's lap and pt able to lift baby from gma's lap into tub and then lift baby out of tub and hand to gma            OT Long Term Goals - 03/18/16 1056    OT LONG TERM GOAL #1   Title Pt will demonstrate ability to carry 18 pound object for at least 600 feet during functional activity 04/08/2016   Status Achieved   OT  LONG TERM GOAL #2   Title Pt will be mod I with sweeping kitchen floor using both UE's   Status Achieved   OT LONG TERM GOAL #3   Title Pt will be mod I stepping over ledge of baby gate holding items in her hands   Status Achieved   OT LONG TERM GOAL #4   Title Pt will demonstrate abilty to turn wheel of care functional radius using both hands   Status Achieved   OT LONG TERM GOAL #5   Title Pt wil demonstrate ability to carry 10 pound object in RUE up/down stairs using railing   Status Achieved   OT LONG TERM GOAL #6   Title Pt will identify components/skills/tasks of potential return to work - 04/08/2016   Status On-going   OT LONG TERM GOAL #7   Title Pt will demonstrate ability to carry 5 pound object up and down stairs without railing   Status On-going   OT LONG TERM GOAL #8   Title 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  Pt with improving confidence about pre work skills   Rehab Potential Good   Clinical Impairments Affecting Rehab Potential cognition, impaired sensation and anxiety   OT Frequency 2x / week   OT Duration 8 weeks   OT Treatment/Interventions Self-care/ADL training;Therapeutic exercise;Patient/family education;Balance training;Ultrasound;Neuromuscular education;Manual Therapy;Splinting;Therapeutic exercises;Energy conservation;Parrafin;DME and/or AE instruction;Therapeutic activities;Cognitive remediation/compensation;Gait Training;Fluidtherapy;Electrical Stimulation;Moist Heat;Contrast Bath;Passive range of motion;Visual/perceptual remediation/compensation   Plan NMR for overhead reach with strength, balance and trunk control in functional tasks.     Consulted and Agree with Plan of Care Patient      Patient will benefit from skilled therapeutic intervention in order to improve the following deficits and impairments:  Abnormal gait, Decreased coordination, Decreased range of motion, Difficulty walking, Impaired flexibility, Decreased endurance, Decreased safety awareness, Increased edema, Impaired tone, Decreased knowledge of precautions, Decreased activity tolerance, Decreased balance, Decreased knowledge of use of DME, Pain, Impaired UE functional use, Impaired vision/preception, Decreased cognition, Decreased mobility, Decreased strength  Visit Diagnosis: Other abnormalities of  gait and mobility  Spastic hemiplegia affecting right dominant side (H[facility])  Abnormal posture  Frontal lobe and executive function deficit  Other disturbances of skin sensation  Stiffness of right shoulder, not elsewhere classified    Problem List Patient Active Problem List   Diagnosis Date Noted  . Spastic hemiplegia and hemiparesis affecting dominant side ([place]) 09/13/2015  . Adhesive capsulitis of right shoulder 08/24/2015  . Symptomatic partial epilepsy with simple partial seizures ([place]) 08/21/2015  . Nontraumatic cortical hemorrhage of cerebral hemisphere ([place])   . Seizure ([place])   . Seizures ([place])   . Adjustment disorder with depressed mood   . Contracture of muscle ankle and foot 06/11/2015  . Hemiplga fol ntrm intcrbl hemor aff right dominant side 06/06/2015  . Left-sided intracerebral hemorrhage ([place]) 06/04/2015  . Seizure disorder as sequela of cerebrovascular accident ([place])   . Seizure disorder ([place]) 06/03/2015  . Sepsis ([place]) 06/03/2015  . UTI (urinary tract infection) 06/03/2015  . Hypotension 06/02/2015  . Right spastic hemiparesis ([place]) 05/28/2015  . 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 History of anxiety disorder 05/28/2015  . ICH (intracerebral hemorrhage) ([place])   . Seizure disorder,  nonconvulsive, with status epilepticus ([place])   . Cerebral venous thrombosis of cortical vein   . Cytotoxic cerebral edema ([place])   . IVH (intraventricular hemorrhage) ([place])   . Term pregnancy 05/07/2015  . Spontaneous vaginal delivery 05/07/2015    Quay Burow, OTR/L 03/18/2016, 11:00 AM  [facility] [address] [place], [place], [address] Phone: [phone]   Fax:  [phone]  Name: Ashley [name] MRN: [id number] Date of Birth: 04-22-1976

## 2016-03-20 ENCOUNTER — Ambulatory Visit: Payer: BLUE CROSS/BLUE SHIELD

## 2016-03-20 DIAGNOSIS — R2689 Other abnormalities of gait and mobility: Secondary | ICD-10-CM | POA: Diagnosis not present

## 2016-03-20 DIAGNOSIS — G8111 Spastic hemiplegia affecting right dominant side: Secondary | ICD-10-CM

## 2016-03-20 NOTE — Therapy (Signed)
[facility] [address] [place], [place], [address] Phone: [phone]   Fax:  [phone]  Physical Therapy Treatment  Patient Details  Name: Ashley [name] MRN: [id number] Date of Birth: 1976-03-05 No Data Recorded  Encounter Date: 03/20/2016      PT End of Session - 03/20/16 1241    Visit Number 68   Number of Visits 80   Date for PT Re-Evaluation 04/11/16   Authorization Type BCBS. Per Conan Bowens, visits now 30 for PT and OT total (15 per OT and 15 per PT). Pt wishes to continue at self pay after visit limit met.    Authorization - Visit Number 40   Authorization - Number of Visits 16   PT Start Time 1016   PT Stop Time 1058   PT Time Calculation (min) 42 min   Equipment Utilized During Treatment --  BWSTT harness   Activity Tolerance Patient tolerated treatment well   Behavior During Therapy WFL for tasks assessed/performed      Past Medical History  Diagnosis Date  . Anxiety   . ICH (intracerebral hemorrhage) (HCC)   . Stroke (HCC)   . Seizures [facility])     Past Surgical History  Procedure Laterality Date  . Dilation and curettage of uterus      There were no vitals filed for this visit.      Subjective Assessment - 03/20/16 1017    Subjective Pt denied falls or changes since last visit.    Pertinent History Seizures, low BP   Patient Stated Goals "Be as close to normal as much as possible and to go back to running" Get back to taking care of my kids, 79 weeks old and 9 and 58 years old   Currently in Pain? No/denies                         [facility] Adult PT Treatment/Exercise - 03/20/16 1019    Ambulation/Gait   Ambulation/Gait Yes   Ambulation/Gait Assistance 5: Supervision;Other (comment)  BWSTT harness   Ambulation/Gait Assistance Details Pt performed BWSTT at 1.4-1. without R AFO donned with manual facilitation and VCs to improve R hip protraction, improve R quad control  during swing phase, and heel strike. Performed 2 bouts of 6 minutes. Pt then performed overground training without AFO donned with cues listed previously.    Ambulation Distance (Feet) 545 Feet  overground and 0.74miles BWSTT   Assistive device None;Body weight support system   Gait Pattern Decreased stance time - right;Decreased weight shift to right;Right foot flat;Decreased hip/knee flexion - right;Step-through pattern;Decreased step length - left;Trunk rotated posteriorly on right   Ambulation Surface Level;Indoor   Pre-Gait Activities Pt performed standing activities (kicking R foot at target) in // bars with 1-2 UE support to improve R eccentric quad control. 2x10 reps.                  PT Short Term Goals - 03/06/16 0903    PT SHORT TERM GOAL #1   Title Pt will be IND in HEP to improve strength, balance, and endurance. Target date: 01/10/16.   Status Achieved   PT SHORT TERM GOAL #2   Title Pt will improve gait speed to >/=1.9ft/sec to decrease falls risk. Target date: 07/27/15.   Status Achieved   PT SHORT TERM GOAL #3   Title Pt will ambulate 300' with LRAD, over even terrain, at MOD I level to improve functional  mobility. Target date: 07/27/15.   Status Achieved   PT SHORT TERM GOAL #4   Title Pt will perform TUG in </=13.5 seconds with LRAD to decr. falls risk. Target date: 01/10/15.   Status Achieved   PT SHORT TERM GOAL #5   Title Perform BERG and write STG and LTG as appropriate. Target date: 07/27/15.   Status Achieved   PT SHORT TERM GOAL #6   Title Pt will improve BERG to >/=51/56 to decr. falls risk. Target date: 07/27/15.   Status Achieved   PT SHORT TERM GOAL #7   Title Pt will amb. 500' over even/uneven terrain with LRAD at MOD I level to improve functional mobility. Target date: 01/10/16   Status Partially Met   PT SHORT TERM GOAL #8   Title Pt will ascend/descend curb with LRAD at MOD I level to improve functional mobility. Target date: 01/10/16   Status  Partially Met   PT SHORT TERM GOAL #9   TITLE Pt will ascend/descend 12 steps with one handrail at MOD I level to improve functional mobility. Target date: 01/10/16   Status Achieved   PT SHORT TERM GOAL #10   TITLE Pt will incr. FGA score to >/=20/24 to decr. falls risk. Target date: 03/10/16   Status Partially Met   PT SHORT TERM GOAL #11   TITLE Pt will dance for 5 minutes with S and no LOB  to dance with family. Target date: 03/10/16   Status Partially Met   PT SHORT TERM GOAL #12   TITLE Pt will jump x5 reps with S to play with children at home. Target date: 03/10/16   Status Achieved   PT SHORT TERM GOAL #13   TITLE Pt will transfer into bed with use of stool (height 35") with S to improve functional mobility at home. Target date: 03/10/16   Status Achieved           PT Long Term Goals - 03/04/16 1156    PT LONG TERM GOAL #1   Title Pt will ambulate 1000' over even/uneven terrain, with LRAD, at MOD I level to improve functional mobiliity. Target date: 02/07/16/17.   Baseline All unmet LTGs will be carried over to new 8 week POC: 04/11/16   Status On-going   PT LONG TERM GOAL #2   Title Pt will amb. 300' over even terrain without an AD, IND, to safely amb. in home. Target date: 04/11/16   Status On-going   PT LONG TERM GOAL #3   Title Pt will improve gait speed to >/=2.104f/sec, with LRAD, to amb. safely in the community. Target date: 11/12/14.   Status Achieved   PT LONG TERM GOAL #4   Title Pt will verbalize plans to join a fitness center upon d/c from PT to continue to maintain strength and endurance gains made during PT. Target date: 04/11/16.   Status On-going   PT LONG TERM GOAL #5   Title Pt improve BERG score to >/=55/56 to decr. falls risk. Target date: 12/11/15   Status Achieved   PT LONG TERM GOAL #6   Title Pt will ascend/descend curb with LRAD at MOD I level to improve functional mobility. Target date: 02/07/16   Status Achieved   PT LONG TERM GOAL #7   Title Pt will  ascend/descend 12 steps no handrail at MOD I level, while carrying 20 lbs.l to improve functional mobility and carry child at home. Target date: 04/11/16   Status On-going   PT LONG TERM  GOAL #8   Title Pt will ascend/descend ladder at MOD I level in order to transfer in/out of water. Target date: 04/11/16   Status On-going   PT LONG TERM GOAL  #9   TITLE Pt will be able to transfer on/off boat at MOD I level, and no LOB, in order improve functional mobility and participate in family activities. Target date: 04/11/16   Status On-going   PT LONG TERM GOAL  #10   TITLE Pt will be IIND in progressed HEP in order to improve balance, strength and flexibility. Target date: 02/07/16   Status Achieved   PT LONG TERM GOAL  #11   TITLE Pt will improve DGI score to >/=20/24 to decr. falls risk. Target date: 02/07/16   Status Achieved   PT LONG TERM GOAL  #12   TITLE Pt will improve FGA score to >/=25/30 to decr. falls risk. Target date: 04/11/16   Status On-going               Plan - 03/20/16 1326    Clinical Impression Statement Pt demonstrated progress, as she was able to amb. at incr. speed and decr. compensatory strategies with cues. Pt has decr. R quad control during swing phase of gait which improved after pre-gait activities and with cues during overground gait training. Conitnue with POC.    Rehab Potential Good   Clinical Impairments Affecting Rehab Potential Seizures which may limit intensity of PT   PT Frequency 2x / week   PT Duration 8 weeks   PT Treatment/Interventions ADLs/Self Care Home Management;Neuromuscular re-education;Cognitive remediation;Biofeedback;DME Instruction;Gait training;Stair training;Canalith Repostioning;Patient/family education;Orthotic Fit/Training;Balance training;Therapeutic exercise;Manual techniques;Therapeutic activities;Vestibular   PT Next Visit Plan R SLS, dance, ladder/boat activities   PT Home Exercise Plan Stretching/strength/balance HEP   Consulted  and Agree with Plan of Care Patient      Patient will benefit from skilled therapeutic intervention in order to improve the following deficits and impairments:  Abnormal gait, Decreased endurance, Impaired sensation, Decreased knowledge of precautions, Decreased activity tolerance, Decreased knowledge of use of DME, Decreased strength, Impaired UE functional use, Impaired tone, Decreased balance, Decreased mobility, Decreased cognition, Decreased range of motion, Decreased safety awareness, Decreased coordination, Impaired flexibility, Postural dysfunction  Visit Diagnosis: Other abnormalities of gait and mobility  Spastic hemiplegia affecting right dominant side (HCC)     Problem List Patient Active Problem List   Diagnosis Date Noted  . Spastic hemiplegia and hemiparesis affecting dominant side (HCC) 09/13/2015  . Adhesive capsulitis of right shoulder 08/24/2015  . Symptomatic partial epilepsy with simple partial seizures (HCC) 08/21/2015  . Nontraumatic cortical hemorrhage of cerebral hemisphere (HCC)   . Seizure (HCC)   . Seizures (HCC)   . Adjustment disorder with depressed mood   . Contracture of muscle ankle and foot 06/11/2015  . Hemiplga fol ntrm intcrbl hemor aff right dominant side 06/06/2015  . Left-sided intracerebral hemorrhage (HCC) 06/04/2015  . Seizure disorder as sequela of cerebrovascular accident (HCC)   . Seizure disorder (HCC) 06/03/2015  . Sepsis (HCC) 06/03/2015  . UTI (urinary tract infection) 06/03/2015  . Hypotension 06/02/2015  . Right spastic hemiparesis (HCC) 05/28/2015  . 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 History of anxiety disorder 05/28/2015  . ICH (intracerebral hemorrhage) (HCC)   . Seizure disorder, nonconvulsive, with status epilepticus (HCC)   . Cerebral venous thrombosis of cortical vein   . Cytotoxic cerebral edema (HCC)   . IVH (intraventricular hemorrhage) (HCC)   . Term pregnancy 05/07/2015  .  Spontaneous vaginal delivery 05/07/2015    Nakyiah Kuck L 03/20/2016, 1:28 PM  [facility] [address] [place], [place], [address] Phone: [phone]   Fax:  [phone]  Name: Marshay Slates MRN: [id number] Date of Birth: 1975/12/18    Geoffry Paradise, PT,DPT 03/20/2016 1:28 PM Phone: [phone] Fax: [phone]

## 2016-03-25 ENCOUNTER — Ambulatory Visit (INDEPENDENT_AMBULATORY_CARE_PROVIDER_SITE_OTHER): Payer: BLUE CROSS/BLUE SHIELD | Admitting: Psychology

## 2016-03-25 ENCOUNTER — Ambulatory Visit: Payer: BLUE CROSS/BLUE SHIELD | Admitting: Occupational Therapy

## 2016-03-25 ENCOUNTER — Encounter: Payer: Self-pay | Admitting: Occupational Therapy

## 2016-03-25 ENCOUNTER — Ambulatory Visit: Payer: BLUE CROSS/BLUE SHIELD

## 2016-03-25 DIAGNOSIS — R41844 Frontal lobe and executive function deficit: Secondary | ICD-10-CM

## 2016-03-25 DIAGNOSIS — R293 Abnormal posture: Secondary | ICD-10-CM

## 2016-03-25 DIAGNOSIS — R2689 Other abnormalities of gait and mobility: Secondary | ICD-10-CM

## 2016-03-25 DIAGNOSIS — G8111 Spastic hemiplegia affecting right dominant side: Secondary | ICD-10-CM

## 2016-03-25 DIAGNOSIS — M25611 Stiffness of right shoulder, not elsewhere classified: Secondary | ICD-10-CM

## 2016-03-25 DIAGNOSIS — R208 Other disturbances of skin sensation: Secondary | ICD-10-CM

## 2016-03-25 DIAGNOSIS — F4323 Adjustment disorder with mixed anxiety and depressed mood: Secondary | ICD-10-CM

## 2016-03-25 NOTE — Therapy (Signed)
[facility] [address] [place], [place], [address] Phone: [phone]   Fax:  [phone]  Occupational Therapy Treatment  Patient Details  Name: Ashley [name] MRN: [id number] Date of Birth: August 23, 1976 No Data Recorded  Encounter Date: 03/25/2016      OT End of Session - 03/25/16 1256    Visit Number 43   Number of Visits 76   Date for OT Re-Evaluation 04/08/16   Authorization Type BCBS   Authorization Time Period 30 visit COMBINED between PT/OT;  pt has utilized benefit and is now self pay.    OT Start Time 1016   OT Stop Time 0100   OT Time Calculation (min) 884 min   Activity Tolerance Patient tolerated treatment well      Past Medical History  Diagnosis Date  . Anxiety   . ICH (intracerebral hemorrhage) ([place])   . Stroke ([place])   . Seizures [facility])     Past Surgical History  Procedure Laterality Date  . Dilation and curettage of uterus      There were no vitals filed for this visit.      Subjective Assessment - 03/25/16 1021    Subjective  We had friends over and went to the  movies this weekend   Pertinent History see epic snapshot; pt with SAH 2 weeks after deliverying her 3rd child   Patient Stated Goals to be able to use my R arm normally   Currently in Pain? No/denies                      OT Treatments/Exercises (OP) - 03/25/16 0001    Neurological Re-education Exercises   Other Exercises 1 Neuro re ed to address LUE overhead control and strength  as well as active trunk control with strength into elongated movement pattern.  Updated pt's HEP to imrproved overhead strength - see pt instruction section.  Pt able to return demonstrate ugrade. Also addressed carrying light weight object up and down the stairs without using railing - pt needs close supervision/intermittent min a and mod vc's.               Balance Exercises - 03/25/16 1159    Balance Exercises: Standing   Rockerboard Anterior/posterior;Lateral;EO;EC;Head turns;10 seconds;30 seconds;10 reps  no UE support   Other Standing Exercises All balance performed in // bars with min A to S for safety: 0-1 UE support: pt performed step ups/downs from 12" height box onto ant/lat/post. rockerboard and bosu ball x15 reps with R LE and L LE stepping down onto board/ball to mimic txf in/out of boat. Cues for technique and to der. R foot inversion.AFO and WalkAide not donned.             OT Short Term Goals - 03/25/16 1254    OT SHORT TERM GOAL  #11   TITLE Pt will be mod I with upgraded HEP - 01/08/2016   Status Achieved   OT SHORT TERM GOAL  #12   TITLE Pt will be able to pick up 8 pound object x 4 trials bilaterally to place on overhead shelf requiring at least 120* of shoulder flexion (for household mgmt tasks, grocery shopping ,etc)   Status Achieved   OT SHORT TERM GOAL  #13   TITLE Pt will be able to lift lightweight object unilaterally with RUE to 120* of shoulder flexion (baseline 115*)   Status Achieved  able to lift 4 pound object to this height  with very minimal compensations   OT SHORT TERM GOAL  #14   TITLE Pt will be able to lift baby into and out of tub while in kneeling from and to baby seat using both UE's - 03/11/2016   Status Achieved  Pt sat on gma's lap and pt able to lift baby from gma's lap into tub and then lift baby out of tub and hand to [place] Term Goals - 03/25/16 [place] #1   Title Pt will demonstrate ability to carry 18 pound object for at least 600 feet during functional activity 04/08/2016   Status Achieved   OT LONG TERM GOAL #2   Title Pt will be mod I with sweeping kitchen floor using both UE's   Status Achieved   OT LONG TERM GOAL #3   Title Pt will be mod I stepping over ledge of baby gate holding items in her hands   Status Achieved   OT LONG TERM GOAL #4   Title Pt will demonstrate abilty to turn wheel of care functional  radius using both hands   Status Achieved   OT LONG TERM GOAL #5   Title Pt wil demonstrate ability to carry 10 pound object in RUE up/down stairs using railing   Status Achieved   OT LONG TERM GOAL #6   Title Pt will identify components/skills/tasks of potential return to work - 04/08/2016   Status On-going   OT LONG TERM GOAL #7   Title Pt will demonstrate ability to carry 5 pound object up and down stairs without railing   Status On-going   OT LONG TERM GOAL #8   Title Pt will demonstrate 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 Pt resuming more responsibilty at home as well as leisure activities. Pt has driving eval scheduled for 04/21/2016   Rehab Potential Good   Clinical Impairments Affecting Rehab Potential cognition, impaired sensation and anxiety   OT Frequency 2x / week   OT Duration 8 weeks   OT Treatment/Interventions Self-care/ADL training;Therapeutic exercise;Patient/family education;Balance training;Ultrasound;Neuromuscular education;Manual Therapy;Splinting;Therapeutic exercises;Energy conservation;Parrafin;DME and/or AE instruction;Therapeutic activities;Cognitive remediation/compensation;Gait Training;Fluidtherapy;Electrical Stimulation;Moist Heat;Contrast Bath;Passive range of motion;Visual/perceptual remediation/compensation   Plan NMR for overhead reach with strength, balance and trunk control in functional tasks   Consulted and Agree with Plan of Care Patient      Patient will benefit from skilled therapeutic intervention in order to improve the following deficits and impairments:  Abnormal gait, Decreased coordination, Decreased range of motion, Difficulty walking,  Impaired flexibility, Decreased endurance, Decreased safety awareness, Increased edema, Impaired tone, Decreased knowledge of precautions, Decreased activity tolerance, Decreased balance, Decreased knowledge of use of DME, Pain, Impaired UE functional use, Impaired vision/preception, Decreased cognition, Decreased mobility, Decreased strength  Visit Diagnosis: Other abnormalities of gait and mobility  Spastic hemiplegia affecting right dominant side (HCC)  Abnormal posture  Frontal lobe and executive function deficit  Other disturbances of skin sensation  Stiffness of right shoulder, not elsewhere classified    Problem List  Patient Active Problem List   Diagnosis Date Noted  . Spastic hemiplegia and hemiparesis affecting dominant side ([place]) 09/13/2015  . Adhesive capsulitis of right shoulder 08/24/2015  . Symptomatic partial epilepsy with simple partial seizures ([place]) 08/21/2015  . Nontraumatic cortical hemorrhage of cerebral hemisphere ([place])   . Seizure ([place])   . Seizures ([place])   . Adjustment disorder with depressed mood   . Contracture of muscle ankle and foot 06/11/2015  . Hemiplga fol ntrm intcrbl hemor aff right dominant side 06/06/2015  . Left-sided intracerebral hemorrhage ([place]) 06/04/2015  . Seizure disorder as sequela of cerebrovascular accident ([place])   . Seizure disorder ([place]) 06/03/2015  . Sepsis ([place]) 06/03/2015  . UTI (urinary tract infection) 06/03/2015  . Hypotension 06/02/2015  . Right spastic hemiparesis ([place]) 05/28/2015  . 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 History of anxiety disorder 05/28/2015  . ICH (intracerebral hemorrhage) ([place])   . Seizure disorder, nonconvulsive, with status epilepticus ([place])   . Cerebral venous thrombosis of cortical vein   . Cytotoxic cerebral edema ([place])   . IVH (intraventricular hemorrhage) ([place])   . Term pregnancy 05/07/2015  . Spontaneous vaginal delivery 05/07/2015    Quay Burow , OTR/L  03/25/2016, 12:58 PM  [facility] [address] [place], [place], [address] Phone: [phone]   Fax:  [phone]  Name: Ashley [name] MRN: [id number] Date of Birth: 08-22-1976

## 2016-03-25 NOTE — Progress Notes (Signed)
[facility]  [address]   Telephone [phone] [address] Fax [phone] [place], [place] [address]   Psychology Progress Note   Name:  Ashley [name] Date of Birth:  [birth year] MRN:  [id number]  Date:03/25/2016 (15m) psychotherapy She is coping well with her stroke. She has a driving evaluation scheduled for next month. Current life stress mostly due to family issues, especially about ex-husband and oldest son's behavior.     Diagnostic Impression Adjustment disorder with nixed anxiety and depressed mood [F43.23]   Mutually agreed to change to bi-monthly schedule. Return in two weeks.  Jamey Ripa, Ph.D Licensed Psychologist

## 2016-03-25 NOTE — Patient Instructions (Signed)
Add to your home program:  Do 1-2 times per day - DO NOT DO MORE THAN  I TELL YOU AND DO NOT USE MORE WEIGHT THAN IS LISTED.   Lay on your back.  Hold a THREE pound weight in both hands (DO NOT USE MORE WEIGHT THAN THIS!!).  Start with arms straight at 90* .  Keeping elbows straight slowly raise weights over your head then hold for count of 5.  Return to starting position.  Do 15 - NO MORE. REST (make sure you rest enough between sets). Then do 15 more. STOP IF YOU GET PAIN.  It is perfectly fine do an active rest (Just rest with the weights above your chest at 90*).  This will not affect the strengthening of the exercises.

## 2016-03-25 NOTE — Therapy (Signed)
[facility] [address] [place], [place], [address] Phone: [phone]   Fax:  [phone]  Physical Therapy Treatment  Patient Details  Name: Ashley [name] MRN: [id number] Date of Birth: 06/08/1976 No Data Recorded  Encounter Date: 03/25/2016      PT End of Session - 03/25/16 1203    Visit Number 71   Number of Visits 36   Date for PT Re-Evaluation 04/11/16   Authorization Type BCBS. Per Laverda Page, visits now 30 for PT and OT total (15 per OT and 15 per PT). Pt wishes to continue at self pay after visit limit met.    Authorization - Visit Number 41   Authorization - Number of Visits 16   PT Start Time 7106   PT Stop Time 1145   PT Time Calculation (min) 42 min   Equipment Utilized During Treatment --  min A to S for safety   Activity Tolerance Patient tolerated treatment well   Behavior During Therapy [facility] for tasks assessed/performed      Past Medical History  Diagnosis Date  . Anxiety   . ICH (intracerebral hemorrhage) ([place])   . Stroke ([place])   . Seizures [facility])     Past Surgical History  Procedure Laterality Date  . Dilation and curettage of uterus      There were no vitals filed for this visit.      Subjective Assessment - 03/25/16 1106    Subjective Pt denied falls since last visit. Pt reported she had a re-eval for [facility] yesterday (at [employer]) and wore it all day yesterday and reported it was less painful but she doesn't really notice a difference.  Pt reported she did not wear AFO this weekend. Pt brought in [place].   Pertinent History Seizures, low BP   Patient Stated Goals "Be as close to normal as much as possible and to go back to running" Get back to taking care of my kids, 49 weeks old and 89 and 42 years old   Currently in Pain? No/denies                         [facility] Adult PT Treatment/Exercise - 03/25/16 1119    Ambulation/Gait   Ambulation/Gait Yes   Ambulation/Gait Assistance 5: Supervision   Ambulation/Gait Assistance Details Pt amb. with WalkAide and R heel wedge (incr. depth on lateral side of heel to decr. inversion). Pt amb. also without WalkAide and AFO donned. Cues to decr. speed in order to improve wt. shifting onto RLE. Pre-gait: PT and pt utilized [employer] "exercise" mode to encourage improve R foot eversion muscle contraction, with pt trying to perform R DF and eversion with the [facility] in order to facilitate improved contractions during gait.   Ambulation Distance (Feet) 115 Feet  x2, 50'x2   Assistive device None   Gait Pattern Decreased stance time - right;Decreased weight shift to right;Right foot flat;Decreased hip/knee flexion - right;Step-through pattern;Decreased step length - left;Trunk rotated posteriorly on right   Ambulation Surface Level;Indoor             Balance Exercises - 03/25/16 1159    Balance Exercises: Standing   Rockerboard Anterior/posterior;Lateral;EO;EC;Head turns;10 seconds;30 seconds;10 reps  no UE support   Other Standing Exercises All balance performed in // bars with min A to S for safety: 0-1 UE support: pt performed step ups/downs from 12" height box onto ant/lat/post. rockerboard and bosu ball x15 reps with R LE  and L LE stepping down onto board/ball to mimic txf in/out of boat. Cues for technique and to der. R foot inversion.AFO and WalkAide not donned.           PT Education - 03/25/16 1202    Education provided Yes   Education Details PT discussed using "exercise" mode on WalkAide 2-3x/day for 1 minute to improve R foot eversion, during 2 week trial period. PT discussed DF not significantly improved with WalkAide vs. R AFO but R foot eversion was improved.   Person(s) Educated Patient   Methods Explanation;Demonstration;Verbal cues   Comprehension Verbalized understanding;Returned demonstration          PT Short Term Goals - 03/06/16 0903    PT SHORT TERM GOAL #1   Title Pt  will be IND in HEP to improve strength, balance, and endurance. Target date: 01/10/16.   Status Achieved   PT SHORT TERM GOAL #2   Title Pt will improve gait speed to >/=1.82f/sec to decrease falls risk. Target date: 07/27/15.   Status Achieved   PT SHORT TERM GOAL #3   Title Pt will ambulate 300' with LRAD, over even terrain, at MOD I level to improve functional mobility. Target date: 07/27/15.   Status Achieved   PT SHORT TERM GOAL #4   Title Pt will perform TUG in </=13.5 seconds with LRAD to decr. falls risk. Target date: 01/10/15.   Status Achieved   PT SHORT TERM GOAL #5   Title Perform BERG and write STG and LTG as appropriate. Target date: 07/27/15.   Status Achieved   PT SHORT TERM GOAL #6   Title Pt will improve BERG to >/=51/56 to decr. falls risk. Target date: 07/27/15.   Status Achieved   PT SHORT TERM GOAL #7   Title Pt will amb. 500' over even/uneven terrain with LRAD at MOD I level to improve functional mobility. Target date: 01/10/16   Status Partially Met   PT SHORT TERM GOAL #8   Title Pt will ascend/descend curb with LRAD at MOD I level to improve functional mobility. Target date: 01/10/16   Status Partially Met   PT SHORT TERM GOAL #9   TITLE Pt will ascend/descend 12 steps with one handrail at MOD I level to improve functional mobility. Target date: 01/10/16   Status Achieved   PT SHORT TERM GOAL #10   TITLE Pt will incr. FGA score to >/=20/24 to decr. falls risk. Target date: 03/10/16   Status Partially Met   PT SHORT TERM GOAL #11   TITLE Pt will dance for 5 minutes with S and no LOB  to dance with family. Target date: 03/10/16   Status Partially Met   PT SHORT TERM GOAL #12   TITLE Pt will jump x5 reps with S to play with children at home. Target date: 03/10/16   Status Achieved   PT SHORT TERM GOAL #13   TITLE Pt will transfer into bed with use of stool (height 35") with S to improve functional mobility at home. Target date: 03/10/16   Status Achieved            PT Long Term Goals - 03/04/16 1156    PT LONG TERM GOAL #1   Title Pt will ambulate 1000' over even/uneven terrain, with LRAD, at MOD I level to improve functional mobiliity. Target date: 02/07/16/17.   Baseline All unmet LTGs will be carried over to new 8 week POC: 04/11/16   Status On-going   PT LONG TERM  GOAL #2   Title Pt will amb. 300' over even terrain without an AD, IND, to safely amb. in home. Target date: 04/11/16   Status On-going   PT LONG TERM GOAL #3   Title Pt will improve gait speed to >/=2.18f/sec, with LRAD, to amb. safely in the community. Target date: 11/12/14.   Status Achieved   PT LONG TERM GOAL #4   Title Pt will verbalize plans to join a fitness center upon d/c from PT to continue to maintain strength and endurance gains made during PT. Target date: 04/11/16.   Status On-going   PT LONG TERM GOAL #5   Title Pt improve BERG score to >/=55/56 to decr. falls risk. Target date: 12/11/15   Status Achieved   PT LONG TERM GOAL #6   Title Pt will ascend/descend curb with LRAD at MOD I level to improve functional mobility. Target date: 02/07/16   Status Achieved   PT LONG TERM GOAL #7   Title Pt will ascend/descend 12 steps no handrail at MOD I level, while carrying 20 lbs.l to improve functional mobility and carry child at home. Target date: 04/11/16   Status On-going   PT LONG TERM GOAL #8   Title Pt will ascend/descend ladder at MOD I level in order to transfer in/out of water. Target date: 04/11/16   Status On-going   PT LONG TERM GOAL  #9   TITLE Pt will be able to transfer on/off boat at MOD I level, and no LOB, in order improve functional mobility and participate in family activities. Target date: 04/11/16   Status On-going   PT LONG TERM GOAL  #10   TITLE Pt will be IIND in progressed HEP in order to improve balance, strength and flexibility. Target date: 02/07/16   Status Achieved   PT LONG TERM GOAL  #11   TITLE Pt will improve DGI score to >/=20/24 to decr. falls  risk. Target date: 02/07/16   Status Achieved   PT LONG TERM GOAL  #12   TITLE Pt will improve FGA score to >/=25/30 to decr. falls risk. Target date: 04/11/16   Status On-going               Plan - 03/25/16 1204    Clinical Impression Statement Pt demonstrated progress as she required less assist during rockerboard activities. Pt did require min A during mock boat txfs with rockerboard and bosu ball 2/2 impaired balance and strength. Pt noted to amb. with improve R foot eversion with WalkAide but R DF was not signficantly improved. Continue with POC.    Rehab Potential Good   Clinical Impairments Affecting Rehab Potential Seizures which may limit intensity of PT   PT Frequency 2x / week   PT Duration 8 weeks   PT Treatment/Interventions ADLs/Self Care Home Management;Neuromuscular re-education;Cognitive remediation;Biofeedback;DME Instruction;Gait training;Stair training;Canalith Repostioning;Patient/family education;Orthotic Fit/Training;Balance training;Therapeutic exercise;Manual techniques;Therapeutic activities;Vestibular   PT Next Visit Plan Continue R SLS, dance, ladder/boat activities, BWSTT   PT Home Exercise Plan Stretching/strength/balance HEP   Consulted and Agree with Plan of Care Patient      Patient will benefit from skilled therapeutic intervention in order to improve the following deficits and impairments:  Abnormal gait, Decreased endurance, Impaired sensation, Decreased knowledge of precautions, Decreased activity tolerance, Decreased knowledge of use of DME, Decreased strength, Impaired UE functional use, Impaired tone, Decreased balance, Decreased mobility, Decreased cognition, Decreased range of motion, Decreased safety awareness, Decreased coordination, Impaired flexibility, Postural dysfunction  Visit Diagnosis: Other abnormalities of  gait and mobility  Spastic hemiplegia affecting right dominant side [facility])     Problem List Patient Active Problem List    Diagnosis Date Noted  . Spastic hemiplegia and hemiparesis affecting dominant side ([place]) 09/13/2015  . Adhesive capsulitis of right shoulder 08/24/2015  . Symptomatic partial epilepsy with simple partial seizures ([place]) 08/21/2015  . Nontraumatic cortical hemorrhage of cerebral hemisphere ([place])   . Seizure ([place])   . Seizures ([place])   . Adjustment disorder with depressed mood   . Contracture of muscle ankle and foot 06/11/2015  . Hemiplga fol ntrm intcrbl hemor aff right dominant side 06/06/2015  . Left-sided intracerebral hemorrhage ([place]) 06/04/2015  . Seizure disorder as sequela of cerebrovascular accident ([place])   . Seizure disorder ([place]) 06/03/2015  . Sepsis ([place]) 06/03/2015  . UTI (urinary tract infection) 06/03/2015  . Hypotension 06/02/2015  . Right spastic hemiparesis ([place]) 05/28/2015  . 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 History of anxiety disorder 05/28/2015  . ICH (intracerebral hemorrhage) ([place])   . Seizure disorder, nonconvulsive, with status epilepticus ([place])   . Cerebral venous thrombosis of cortical vein   . Cytotoxic cerebral edema ([place])   . IVH (intraventricular hemorrhage) ([place])   . Term pregnancy 05/07/2015  . Spontaneous vaginal delivery 05/07/2015    , L 03/25/2016, 12:08 PM  [facility] [address] [place], [place], [address] Phone: [phone]   Fax:  [phone]  Name: Ashley [name] MRN: [id number] Date of Birth: 01-17-1976    Geoffry Paradise, PT,DPT 03/25/2016 12:08 PM Phone: [phone] Fax: [phone]

## 2016-03-27 ENCOUNTER — Ambulatory Visit: Payer: BLUE CROSS/BLUE SHIELD

## 2016-03-27 ENCOUNTER — Ambulatory Visit: Payer: BLUE CROSS/BLUE SHIELD | Admitting: Occupational Therapy

## 2016-03-31 ENCOUNTER — Encounter: Payer: Self-pay | Admitting: Occupational Therapy

## 2016-03-31 ENCOUNTER — Ambulatory Visit: Payer: BLUE CROSS/BLUE SHIELD | Admitting: Occupational Therapy

## 2016-03-31 VITALS — BP 84/57 | HR 81

## 2016-03-31 DIAGNOSIS — R208 Other disturbances of skin sensation: Secondary | ICD-10-CM

## 2016-03-31 DIAGNOSIS — M25611 Stiffness of right shoulder, not elsewhere classified: Secondary | ICD-10-CM

## 2016-03-31 DIAGNOSIS — R293 Abnormal posture: Secondary | ICD-10-CM

## 2016-03-31 DIAGNOSIS — R2689 Other abnormalities of gait and mobility: Secondary | ICD-10-CM | POA: Diagnosis not present

## 2016-03-31 DIAGNOSIS — R41844 Frontal lobe and executive function deficit: Secondary | ICD-10-CM

## 2016-03-31 DIAGNOSIS — G8111 Spastic hemiplegia affecting right dominant side: Secondary | ICD-10-CM

## 2016-03-31 NOTE — Therapy (Signed)
[facility] [address] [place], [place], [address] Phone: [phone]   Fax:  [phone]  Occupational Therapy Treatment  Patient Details  Name: Ashley [name] MRN: [id number] Date of Birth: 12/22/1975 No Data Recorded  Encounter Date: 03/31/2016      OT End of Session - 03/31/16 1304    Visit Number 3   Number of Visits 73   Date for OT Re-Evaluation 04/08/16   Authorization Type BCBS   Authorization Time Period 30 visit COMBINED between PT/OT;  pt has utilized benefit and is now self pay.    OT Start Time 1102   OT Stop Time 1145   OT Time Calculation (min) 43 min   Activity Tolerance Patient tolerated treatment well      Past Medical History  Diagnosis Date  . Anxiety   . ICH (intracerebral hemorrhage) ([place])   . Stroke ([place])   . Seizures [facility])     Past Surgical History  Procedure Laterality Date  . Dilation and curettage of uterus      Filed Vitals:   03/31/16 1109  BP: 84/57  Pulse: 81        Subjective Assessment - 03/31/16 1109    Subjective  I had a really bad headach last Thursday - it scared me.     Pertinent History see epic snapshot; pt with [facility] 2 weeks after deliverying her 3rd child   Patient Stated Goals to be able to use my R arm normally   Currently in Pain? No/denies                      OT Treatments/Exercises (OP) - 03/31/16 0001    Neurological Re-education Exercises   Other Exercises 1 Neuro re ed to address flexibility and strength in overhead reach first in supine and then in sitting, transitioning to standing.  Pt able to lift 7 pound weight from overhead shelf with RUE however additional weight caused some intermittent pain in R shoulder.  Also addresed climbing stairs without a railing first without carrying anything and then with carrying 5 pound item. Initially pt needed intermittent min a (2 LOB) however with practice pt able to complete with  supervision and vc's.                    OT Short Term Goals - 03/31/16 1301    OT SHORT TERM GOAL  #11   TITLE Pt will be mod I with upgraded HEP - 01/08/2016   Status Achieved   OT SHORT TERM GOAL  #12   TITLE Pt will be able to pick up 8 pound object x 4 trials bilaterally to place on overhead shelf requiring at least 120* of shoulder flexion (for household mgmt tasks, grocery shopping ,etc)   Status Achieved   OT SHORT TERM GOAL  #13   TITLE Pt will be able to lift lightweight object unilaterally with RUE to 120* of shoulder flexion (baseline 115*)   Status Achieved  able to lift 4 pound object to this height with very minimal compensations   OT SHORT TERM GOAL  #14   TITLE Pt will be able to lift baby into and out of tub while in kneeling from and to baby seat using both UE's - 03/11/2016   Status Achieved  Pt sat on gma's lap and pt able to lift baby from gma's lap into tub and then lift baby out of tub and hand to [facility]  OT Long Term Goals - 03/31/16 1301    OT LONG TERM GOAL #1   Title Pt will demonstrate ability to carry 18 pound object for at least 600 feet during functional activity 04/08/2016   Status Achieved   OT LONG TERM GOAL #2   Title Pt will be mod I with sweeping kitchen floor using both UE's   Status Achieved   OT LONG TERM GOAL #3   Title Pt will be mod I stepping over ledge of baby gate holding items in her hands   Status Achieved   OT LONG TERM GOAL #4   Title Pt will demonstrate abilty to turn wheel of care functional radius using both hands   Status Achieved   OT LONG TERM GOAL #5   Title Pt wil demonstrate ability to carry 10 pound object in RUE up/down stairs using railing   Status Achieved   OT LONG TERM GOAL #6   Title Pt will identify components/skills/tasks of potential return to work - 04/08/2016   Status Achieved   OT LONG TERM GOAL #7   Title Pt will demonstrate ability to carry 5 pound object up and down stairs without  railing   Status On-going   OT LONG TERM GOAL #8   Title 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 Pt motivated to work on higher level more challenging activities in standing using her RUE.    Rehab Potential Good   Clinical Impairments Affecting Rehab Potential cognition, impaired sensation and anxiety   OT Frequency 2x / week   OT Duration 8 weeks   OT Treatment/Interventions Self-care/ADL training;Therapeutic exercise;Patient/family education;Balance training;Ultrasound;Neuromuscular education;Manual Therapy;Splinting;Therapeutic exercises;Energy conservation;Parrafin;DME and/or AE instruction;Therapeutic activities;Cognitive remediation/compensation;Gait Training;Fluidtherapy;Electrical Stimulation;Moist Heat;Contrast Bath;Passive range of motion;Visual/perceptual remediation/compensation   Plan NMR for overhead reach with strength, balance and trunk control in functional tasks   Consulted and Agree with Plan of Care Patient      Patient will benefit from skilled therapeutic intervention in order to improve the following deficits and impairments:  Abnormal gait, Decreased coordination, Decreased range of motion, Difficulty walking, Impaired flexibility, Decreased endurance, Decreased safety awareness, Increased edema, Impaired tone, Decreased knowledge of precautions, Decreased activity tolerance, Decreased balance, Decreased knowledge of use of DME, Pain, Impaired UE functional use, Impaired vision/preception, Decreased cognition, Decreased mobility, Decreased strength  Visit Diagnosis: Other abnormalities of gait and mobility  Spastic hemiplegia affecting right dominant side  ([facility])  Abnormal posture  Frontal lobe and executive function deficit  Other disturbances of skin sensation  Stiffness of right shoulder, not elsewhere classified    Problem List Patient Active Problem List   Diagnosis Date Noted  . Spastic hemiplegia and hemiparesis affecting dominant side ([place]) 09/13/2015  . Adhesive capsulitis of right shoulder 08/24/2015  . Symptomatic partial epilepsy with simple partial seizures ([place]) 08/21/2015  . Nontraumatic cortical hemorrhage of cerebral hemisphere ([place])   . Seizure ([place])   . Seizures ([place])   . Adjustment disorder with depressed mood   . Contracture of muscle ankle and foot 06/11/2015  . Hemiplga fol ntrm intcrbl hemor aff right dominant side  06/06/2015  . Left-sided intracerebral hemorrhage ([place]) 06/04/2015  . Seizure disorder as sequela of cerebrovascular accident ([place])   . Seizure disorder ([place]) 06/03/2015  . Sepsis ([place]) 06/03/2015  . UTI (urinary tract infection) 06/03/2015  . Hypotension 06/02/2015  . Right spastic hemiparesis ([place]) 05/28/2015  . 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 History of anxiety disorder 05/28/2015  . ICH (intracerebral hemorrhage) ([place])   . Seizure disorder, nonconvulsive, with status epilepticus ([place])   . Cerebral venous thrombosis of cortical vein   . Cytotoxic cerebral edema ([place])   . IVH (intraventricular hemorrhage) ([place])   . Term pregnancy 05/07/2015  . Spontaneous vaginal delivery 05/07/2015    Quay Burow, OTR/L 03/31/2016, 1:06 PM  [facility] [address] [place], [place], [address] Phone: [phone]   Fax:  [phone]  Name: Ashley [name] MRN: [id number] Date of Birth: 1975/11/12

## 2016-04-01 ENCOUNTER — Ambulatory Visit: Payer: BLUE CROSS/BLUE SHIELD | Admitting: Occupational Therapy

## 2016-04-01 ENCOUNTER — Encounter: Payer: Self-pay | Admitting: Occupational Therapy

## 2016-04-01 ENCOUNTER — Ambulatory Visit: Payer: BLUE CROSS/BLUE SHIELD

## 2016-04-01 DIAGNOSIS — R2689 Other abnormalities of gait and mobility: Secondary | ICD-10-CM

## 2016-04-01 DIAGNOSIS — G8111 Spastic hemiplegia affecting right dominant side: Secondary | ICD-10-CM

## 2016-04-01 DIAGNOSIS — R41844 Frontal lobe and executive function deficit: Secondary | ICD-10-CM

## 2016-04-01 DIAGNOSIS — R208 Other disturbances of skin sensation: Secondary | ICD-10-CM

## 2016-04-01 DIAGNOSIS — R293 Abnormal posture: Secondary | ICD-10-CM

## 2016-04-01 DIAGNOSIS — M25611 Stiffness of right shoulder, not elsewhere classified: Secondary | ICD-10-CM

## 2016-04-01 NOTE — Therapy (Signed)
[facility] [address] [place], [place], [address] Phone: [phone]   Fax:  [phone]  Physical Therapy Treatment  Patient Details  Name: Ashley [name] MRN: [id number] Date of Birth: 20-Apr-1976 No Data Recorded  Encounter Date: 04/01/2016      PT End of Session - 04/01/16 1107    Visit Number 70   Number of Visits 40   Date for PT Re-Evaluation 04/11/16   Authorization Type BCBS. Per Laverda Page, visits now 30 for PT and OT total (15 per OT and 15 per PT). Pt wishes to continue at self pay after visit limit met.    Authorization - Visit Number 42   Authorization - Number of Visits 16   PT Start Time 1016   PT Stop Time 1058   PT Time Calculation (min) 42 min   Equipment Utilized During Treatment Gait belt  and BWSTT harness   Activity Tolerance Patient tolerated treatment well   Behavior During Therapy WFL for tasks assessed/performed      Past Medical History  Diagnosis Date  . Anxiety   . ICH (intracerebral hemorrhage) ([place])   . Stroke ([place])   . Seizures [facility])     Past Surgical History  Procedure Laterality Date  . Dilation and curettage of uterus      There were no vitals filed for this visit.      Subjective Assessment - 04/01/16 1021    Subjective Pt denied falls since last visit. Pt reported she had a bad HA last Thursday and that's why she cancelled appt, but she feels better now. Pt reported she went on the boat this weekend and had difficultly climbing into boat on ladder, as R foot slipped off ladder. She also had difficulty swimming. Pt reports she does not feel Doreen Salvage is helping her a whole lot and it hurts her leg (sensitive skin). Pt reports she didn't perform any exercises over the weekend.    Pertinent History Seizures, low BP   Patient Stated Goals "Be as close to normal as much as possible and to go back to running" Get back to taking care of my kids, 97 weeks old and 41 and 41 years  old   Currently in Pain? No/denies                         [facility] Adult PT Treatment/Exercise - 04/01/16 1028    Ambulation/Gait   Ambulation/Gait Yes   Ambulation/Gait Assistance 5: Supervision;Other (comment)  BWSTT support   Ambulation/Gait Assistance Details Pt performed BWSTT at 1.68mh for approx. 8 minutes with tactile and VC's to improve R hip protraction and eccentric control during R swing phase. Pt also amb. overground after BWSTT with cues for R hip protraction and improved R knee flex. All performed without AFO donned.    Ambulation Distance (Feet) 0.18 Feet  miles on treadmill and 500' indoors   Assistive device None;Body weight support system   Gait Pattern Decreased stance time - right;Decreased weight shift to right;Right foot flat;Decreased hip/knee flexion - right;Step-through pattern;Decreased step length - left;Trunk rotated posteriorly on right   Ambulation Surface Level;Indoor   High Level Balance   High Level Balance Activities Other (comment)   High Level Balance Comments Pt ascended/descended stool with rungs similar to boat ladder to mimic climbng boat ladder. Performed with min guard to min A with cues to improve wt. shifting and technique.Performed with +2 for safety x10 reps.  Self Care:     PT Education - 04/01/16 1106    Education provided Yes   Education Details During ladder training, PT discussed safety and techniques to traverse boat ladder. PT recommended wearing swimming shoes with good grip to traverse ladder. PT also discussed donning R AFO when amb. longer distances to decr. gait deviations, as pt reported she is not wearing brace all the time.    Person(s) Educated Patient   Methods Explanation;Demonstration;Verbal cues;Handout;Tactile cues   Comprehension Returned demonstration;Verbalized understanding          PT Short Term Goals - 03/06/16 0903    PT SHORT TERM GOAL #1   Title Pt will be IND in HEP to  improve strength, balance, and endurance. Target date: 01/10/16.   Status Achieved   PT SHORT TERM GOAL #2   Title Pt will improve gait speed to >/=1.52f/sec to decrease falls risk. Target date: 07/27/15.   Status Achieved   PT SHORT TERM GOAL #3   Title Pt will ambulate 300' with LRAD, over even terrain, at MOD I level to improve functional mobility. Target date: 07/27/15.   Status Achieved   PT SHORT TERM GOAL #4   Title Pt will perform TUG in </=13.5 seconds with LRAD to decr. falls risk. Target date: 01/10/15.   Status Achieved   PT SHORT TERM GOAL #5   Title Perform BERG and write STG and LTG as appropriate. Target date: 07/27/15.   Status Achieved   PT SHORT TERM GOAL #6   Title Pt will improve BERG to >/=51/56 to decr. falls risk. Target date: 07/27/15.   Status Achieved   PT SHORT TERM GOAL #7   Title Pt will amb. 500' over even/uneven terrain with LRAD at MOD I level to improve functional mobility. Target date: 01/10/16   Status Partially Met   PT SHORT TERM GOAL #8   Title Pt will ascend/descend curb with LRAD at MOD I level to improve functional mobility. Target date: 01/10/16   Status Partially Met   PT SHORT TERM GOAL #9   TITLE Pt will ascend/descend 12 steps with one handrail at MOD I level to improve functional mobility. Target date: 01/10/16   Status Achieved   PT SHORT TERM GOAL #10   TITLE Pt will incr. FGA score to >/=20/24 to decr. falls risk. Target date: 03/10/16   Status Partially Met   PT SHORT TERM GOAL #11   TITLE Pt will dance for 5 minutes with S and no LOB  to dance with family. Target date: 03/10/16   Status Partially Met   PT SHORT TERM GOAL #12   TITLE Pt will jump x5 reps with S to play with children at home. Target date: 03/10/16   Status Achieved   PT SHORT TERM GOAL #13   TITLE Pt will transfer into bed with use of stool (height 35") with S to improve functional mobility at home. Target date: 03/10/16   Status Achieved           PT Long Term  Goals - 03/04/16 1156    PT LONG TERM GOAL #1   Title Pt will ambulate 1000' over even/uneven terrain, with LRAD, at MOD I level to improve functional mobiliity. Target date: 02/07/16/17.   Baseline All unmet LTGs will be carried over to new 8 week POC: 04/11/16   Status On-going   PT LONG TERM GOAL #2   Title Pt will amb. 300' over even terrain without an AD, IND, to safely  amb. in home. Target date: 04/11/16   Status On-going   PT LONG TERM GOAL #3   Title Pt will improve gait speed to >/=2.73f/sec, with LRAD, to amb. safely in the community. Target date: 11/12/14.   Status Achieved   PT LONG TERM GOAL #4   Title Pt will verbalize plans to join a fitness center upon d/c from PT to continue to maintain strength and endurance gains made during PT. Target date: 04/11/16.   Status On-going   PT LONG TERM GOAL #5   Title Pt improve BERG score to >/=55/56 to decr. falls risk. Target date: 12/11/15   Status Achieved   PT LONG TERM GOAL #6   Title Pt will ascend/descend curb with LRAD at MOD I level to improve functional mobility. Target date: 02/07/16   Status Achieved   PT LONG TERM GOAL #7   Title Pt will ascend/descend 12 steps no handrail at MOD I level, while carrying 20 lbs.l to improve functional mobility and carry child at home. Target date: 04/11/16   Status On-going   PT LONG TERM GOAL #8   Title Pt will ascend/descend ladder at MOD I level in order to transfer in/out of water. Target date: 04/11/16   Status On-going   PT LONG TERM GOAL  #9   TITLE Pt will be able to transfer on/off boat at MOD I level, and no LOB, in order improve functional mobility and participate in family activities. Target date: 04/11/16   Status On-going   PT LONG TERM GOAL  #10   TITLE Pt will be IIND in progressed HEP in order to improve balance, strength and flexibility. Target date: 02/07/16   Status Achieved   PT LONG TERM GOAL  #11   TITLE Pt will improve DGI score to >/=20/24 to decr. falls risk. Target  date: 02/07/16   Status Achieved   PT LONG TERM GOAL  #12   TITLE Pt will improve FGA score to >/=25/30 to decr. falls risk. Target date: 04/11/16   Status On-going               Plan - 04/01/16 1108    Clinical Impression Statement Pt demonstrated progress as she was able to amb. during BWSTT at incr. speed, 1.91m. Pt also demonstrated improved stride length during amb. at inDuartespeeds but did experience difficulty performing R hip protraction at incr. speeds and required cues. Continues with POC.    Rehab Potential Good   Clinical Impairments Affecting Rehab Potential Seizures which may limit intensity of PT   PT Frequency 2x / week   PT Duration 8 weeks   PT Treatment/Interventions ADLs/Self Care Home Management;Neuromuscular re-education;Cognitive remediation;Biofeedback;DME Instruction;Gait training;Stair training;Canalith Repostioning;Patient/family education;Orthotic Fit/Training;Balance training;Therapeutic exercise;Manual techniques;Therapeutic activities;Vestibular   PT Next Visit Plan Continue R SLS, dance, ladder/boat activities, BWSTT   PT Home Exercise Plan Stretching/strength/balance HEP   Consulted and Agree with Plan of Care Patient      Patient will benefit from skilled therapeutic intervention in order to improve the following deficits and impairments:  Abnormal gait, Decreased endurance, Impaired sensation, Decreased knowledge of precautions, Decreased activity tolerance, Decreased knowledge of use of DME, Decreased strength, Impaired UE functional use, Impaired tone, Decreased balance, Decreased mobility, Decreased cognition, Decreased range of motion, Decreased safety awareness, Decreased coordination, Impaired flexibility, Postural dysfunction  Visit Diagnosis: Other abnormalities of gait and mobility  Spastic hemiplegia affecting right dominant side ([facility]    Problem List Patient Active Problem List   Diagnosis Date Noted  .  Spastic hemiplegia and  hemiparesis affecting dominant side ([place]) 09/13/2015  . Adhesive capsulitis of right shoulder 08/24/2015  . Symptomatic partial epilepsy with simple partial seizures ([place]) 08/21/2015  . Nontraumatic cortical hemorrhage of cerebral hemisphere ([place])   . Seizure ([place])   . Seizures ([place])   . Adjustment disorder with depressed mood   . Contracture of muscle ankle and foot 06/11/2015  . Hemiplga fol ntrm intcrbl hemor aff right dominant side 06/06/2015  . Left-sided intracerebral hemorrhage ([place]) 06/04/2015  . Seizure disorder as sequela of cerebrovascular accident ([place])   . Seizure disorder ([place]) 06/03/2015  . Sepsis ([place]) 06/03/2015  . UTI (urinary tract infection) 06/03/2015  . Hypotension 06/02/2015  . Right spastic hemiparesis ([place]) 05/28/2015  . 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 History of anxiety disorder 05/28/2015  . ICH (intracerebral hemorrhage) ([place])   . Seizure disorder, nonconvulsive, with status epilepticus ([place])   . Cerebral venous thrombosis of cortical vein   . Cytotoxic cerebral edema ([place])   . IVH (intraventricular hemorrhage) ([place])   . Term pregnancy 05/07/2015  . Spontaneous vaginal delivery 05/07/2015    Atlee Kluth L 04/01/2016, 11:12 AM  [facility] [address] [place], [place], [address] Phone: [phone]   Fax:  [phone]  Name: Jetta Murray MRN: [id number] Date of Birth: 07/22/1976    Geoffry Paradise, PT,DPT 04/01/2016 11:12 AM Phone: [phone] Fax: [phone]

## 2016-04-01 NOTE — Therapy (Signed)
[facility] [address] [place], [place], [address] Phone: [phone]   Fax:  [phone]  Occupational Therapy Treatment  Patient Details  Name: Ashley [name] MRN: [id number] Date of Birth: 07-09-1976 No Data Recorded  Encounter Date: 04/01/2016      OT End of Session - 04/01/16 1224    Visit Number 85   Number of Visits 79   Date for OT Re-Evaluation 04/08/16   Authorization Type BCBS   Authorization Time Period 30 visit COMBINED between PT/OT;  pt has utilized benefit and is now self pay.    OT Start Time 1103   OT Stop Time 1146   OT Time Calculation (min) 43 min   Activity Tolerance Patient tolerated treatment well      Past Medical History  Diagnosis Date  . Anxiety   . ICH (intracerebral hemorrhage) ([place])   . Stroke ([place])   . Seizures [facility])     Past Surgical History  Procedure Laterality Date  . Dilation and curettage of uterus      There were no vitals filed for this visit.      Subjective Assessment - 04/01/16 1218    Subjective  Sometimes I just get so frustrated   Pertinent History see epic snapshot; pt with [facility] 2 weeks after deliverying her 3rd child   Patient Stated Goals to be able to use my R arm normally   Currently in Pain? No/denies                      OT Treatments/Exercises (OP) - 04/01/16 0001    Neurological Re-education Exercises   Other Exercises 1 Neuro re ed to address balance, increasng weight on RLE, trunk control and RUE overhead strength. Pt improving in her ability to go up and down steps without a railing however still requires supervision.  Pt limited with fulll ROM and strength overhead due to increased tone in R pec and tightness in shouler girdle.                    OT Short Term Goals - 04/01/16 1221    OT SHORT TERM GOAL  #11   TITLE Pt will be mod I with upgraded HEP - 01/08/2016   Status Achieved   OT SHORT TERM GOAL  #12   TITLE Pt will be able to pick up 8 pound object x 4 trials bilaterally to place on overhead shelf requiring at least 120* of shoulder flexion (for household mgmt tasks, grocery shopping ,etc)   Status Achieved   OT SHORT TERM GOAL  #13   TITLE Pt will be able to lift lightweight object unilaterally with RUE to 120* of shoulder flexion (baseline 115*)   Status Achieved  able to lift 4 pound object to this height with very minimal compensations   OT SHORT TERM GOAL  #14   TITLE Pt will be able to lift baby into and out of tub while in kneeling from and to baby seat using both UE's - 03/11/2016   Status Achieved  Pt sat on gma's lap and pt able to lift baby from gma's lap into tub and then lift baby out of tub and hand to gma            OT Long Term Goals - 04/01/16 1221    OT LONG TERM GOAL #1   Title Pt will demonstrate ability to carry 18 pound object for at least 600  feet during functional activity 04/08/2016   Status Achieved   OT LONG TERM GOAL #2   Title Pt will be mod I with sweeping kitchen floor using both UE's   Status Achieved   OT LONG TERM GOAL #3   Title Pt will be mod I stepping over ledge of baby gate holding items in her hands   Status Achieved   OT LONG TERM GOAL #4   Title Pt will demonstrate abilty to turn wheel of care functional radius using both hands   Status Achieved   OT LONG TERM GOAL #5   Title Pt wil demonstrate ability to carry 10 pound object in RUE up/down stairs using railing   Status Achieved   OT LONG TERM GOAL #6   Title Pt will identify components/skills/tasks of potential return to work - 04/08/2016   Status Achieved   OT LONG TERM GOAL #7   Title Pt will demonstrate ability to carry 5 pound object up and down stairs without railing   Status On-going   OT LONG TERM GOAL #8   Title Pt will demonstrate 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  Increased tone in R pec limiting pt in ER and strength in overhead reach.   Rehab Potential Good   Clinical Impairments Affecting Rehab Potential cognition, impaired sensation and anxiety   OT Frequency 2x / week   OT Duration 8 weeks   OT Treatment/Interventions Self-care/ADL training;Therapeutic exercise;Patient/family education;Balance training;Ultrasound;Neuromuscular education;Manual Therapy;Splinting;Therapeutic exercises;Energy conservation;Parrafin;DME and/or AE instruction;Therapeutic activities;Cognitive remediation/compensation;Gait Training;Fluidtherapy;Electrical Stimulation;Moist Heat;Contrast Bath;Passive range of motion;Visual/perceptual remediation/compensation   Plan NMR for overhead reach and strength, balance and trunk control in functional tasks   Consulted and Agree with Plan of Care Patient      Patient will benefit from skilled therapeutic intervention in order to improve the following deficits and impairments:     Visit Diagnosis: Other abnormalities of gait and mobility  Spastic hemiplegia affecting right dominant side ([facility])  Abnormal posture  Frontal lobe and executive function deficit  Other disturbances of skin sensation  Stiffness of right shoulder, not elsewhere classified    Problem List Patient Active Problem List   Diagnosis Date Noted  . Spastic hemiplegia and hemiparesis affecting dominant side ([place]) 09/13/2015  . Adhesive capsulitis of right shoulder 08/24/2015  . Symptomatic partial epilepsy with simple partial seizures ([place]) 08/21/2015  . Nontraumatic cortical hemorrhage of cerebral hemisphere ([place])   . Seizure ([place])   . Seizures ([place])   . Adjustment disorder with depressed mood   . Contracture of muscle ankle and foot  06/11/2015  . Hemiplga fol ntrm intcrbl hemor aff right dominant side 06/06/2015  . Left-sided intracerebral hemorrhage ([place]) 06/04/2015  . Seizure disorder as sequela of cerebrovascular accident ([place])   . Seizure disorder ([place]) 06/03/2015  . Sepsis ([place]) 06/03/2015  . UTI (urinary tract infection) 06/03/2015  . Hypotension 06/02/2015  . Right spastic hemiparesis ([place]) 05/28/2015  . 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 History of anxiety disorder 05/28/2015  . ICH (intracerebral hemorrhage) ([place])   . Seizure disorder, nonconvulsive, with  status epilepticus ([place])   . Cerebral venous thrombosis of cortical vein   . Cytotoxic cerebral edema ([place])   . IVH (intraventricular hemorrhage) ([place])   . Term pregnancy 05/07/2015  . Spontaneous vaginal delivery 05/07/2015    Quay Burow, OTR/L 04/01/2016, 12:25 PM  [facility] [address] [place], [place], [address] Phone: [phone]   Fax:  [phone]  Name: Fabiola Corti MRN: [id number] Date of Birth: 1976/01/16

## 2016-04-03 ENCOUNTER — Ambulatory Visit: Payer: BLUE CROSS/BLUE SHIELD

## 2016-04-03 DIAGNOSIS — G8111 Spastic hemiplegia affecting right dominant side: Secondary | ICD-10-CM

## 2016-04-03 DIAGNOSIS — R2689 Other abnormalities of gait and mobility: Secondary | ICD-10-CM | POA: Diagnosis not present

## 2016-04-03 NOTE — Therapy (Signed)
[facility] [address] [place], [place], [address] Phone: [phone]   Fax:  [phone]  Physical Therapy Treatment  Patient Details  Name: Ashley [name] MRN: [id number] Date of Birth: 1976/03/05 No Data Recorded  Encounter Date: 04/03/2016      PT End of Session - 04/03/16 1222    Visit Number 93   Number of Visits 62   Date for PT Re-Evaluation 04/11/16   Authorization Type BCBS. Per Laverda Page, visits now 30 for PT and OT total (15 per OT and 15 per PT). Pt wishes to continue at self pay after visit limit met.    PT Start Time 1019  pt arrived late   PT Stop Time 1058   PT Time Calculation (min) 39 min   Equipment Utilized During Treatment --  min guard to min A for safety and SOT harness   Activity Tolerance Patient tolerated treatment well   Behavior During Therapy WFL for tasks assessed/performed      Past Medical History  Diagnosis Date  . Anxiety   . ICH (intracerebral hemorrhage) ([place])   . Stroke ([place])   . Seizures [facility])     Past Surgical History  Procedure Laterality Date  . Dilation and curettage of uterus      There were no vitals filed for this visit.      Subjective Assessment - 04/03/16 1022    Subjective Pt reported she hasn't been using the [facility] due to it causing pain. pt denied falls since last visit.  Pt reported new tennis shoes help to keep R foot from rolling out.    Patient is accompained by: Family member  pt's son   Pertinent History Seizures, low BP   Patient Stated Goals "Be as close to normal as much as possible and to go back to running" Get back to taking care of my kids, 39 weeks old and 51 and 41 years old   Currently in Pain? No/denies                         [facility] Adult PT Treatment/Exercise - 04/03/16 1023    Ambulation/Gait   Ambulation/Gait Yes   Ambulation/Gait Assistance 5: Supervision   Ambulation/Gait Assistance Details Pt amb.  indoors/outdoors without R AFO donned with cues to improve R hip protraction, heel strike, and stride length.  Cues to decr. R foot supination during amb. over grassy terrain.   Ambulation Distance (Feet) 600 Feet   Assistive device None   Gait Pattern Decreased stance time - right;Decreased weight shift to right;Right foot flat;Decreased hip/knee flexion - right;Step-through pattern;Decreased step length - left;Trunk rotated posteriorly on right   Ambulation Surface Level;Unlevel;Indoor;Outdoor;Paved;Grass             Balance Exercises - 04/03/16 1218    Balance Exercises: Standing   SLS Eyes open;Solid surface;Upper extremity support 1;Intermittent upper extremity support;5 reps;10 secs   Balance Master: Limits for Stability Targets at 50-75% from center in R circle, 3 posterior circles, and R sided targets at 2 minutes with 5 sec. to reach target with fixed and responsive targets. VC's and tactile cues to improve post/lat R wt. shifting. Performed 4 trials.   Other Standing Exercises Performed in // bars with min guard to S for safety, cues for improved glute med/max contraction (RLE). Pt also performed heel taps in ant/lat/post directions to improve RLE stance time.  PT Short Term Goals - 03/06/16 0903    PT SHORT TERM GOAL #1   Title Pt will be IND in HEP to improve strength, balance, and endurance. Target date: 01/10/16.   Status Achieved   PT SHORT TERM GOAL #2   Title Pt will improve gait speed to >/=1.13f/sec to decrease falls risk. Target date: 07/27/15.   Status Achieved   PT SHORT TERM GOAL #3   Title Pt will ambulate 300' with LRAD, over even terrain, at MOD I level to improve functional mobility. Target date: 07/27/15.   Status Achieved   PT SHORT TERM GOAL #4   Title Pt will perform TUG in </=13.5 seconds with LRAD to decr. falls risk. Target date: 01/10/15.   Status Achieved   PT SHORT TERM GOAL #5   Title Perform BERG and write STG and LTG as  appropriate. Target date: 07/27/15.   Status Achieved   PT SHORT TERM GOAL #6   Title Pt will improve BERG to >/=51/56 to decr. falls risk. Target date: 07/27/15.   Status Achieved   PT SHORT TERM GOAL #7   Title Pt will amb. 500' over even/uneven terrain with LRAD at MOD I level to improve functional mobility. Target date: 01/10/16   Status Partially Met   PT SHORT TERM GOAL #8   Title Pt will ascend/descend curb with LRAD at MOD I level to improve functional mobility. Target date: 01/10/16   Status Partially Met   PT SHORT TERM GOAL #9   TITLE Pt will ascend/descend 12 steps with one handrail at MOD I level to improve functional mobility. Target date: 01/10/16   Status Achieved   PT SHORT TERM GOAL #10   TITLE Pt will incr. FGA score to >/=20/24 to decr. falls risk. Target date: 03/10/16   Status Partially Met   PT SHORT TERM GOAL #11   TITLE Pt will dance for 5 minutes with S and no LOB  to dance with family. Target date: 03/10/16   Status Partially Met   PT SHORT TERM GOAL #12   TITLE Pt will jump x5 reps with S to play with children at home. Target date: 03/10/16   Status Achieved   PT SHORT TERM GOAL #13   TITLE Pt will transfer into bed with use of stool (height 35") with S to improve functional mobility at home. Target date: 03/10/16   Status Achieved           PT Long Term Goals - 03/04/16 1156    PT LONG TERM GOAL #1   Title Pt will ambulate 1000' over even/uneven terrain, with LRAD, at MOD I level to improve functional mobiliity. Target date: 02/07/16/17.   Baseline All unmet LTGs will be carried over to new 8 week POC: 04/11/16   Status On-going   PT LONG TERM GOAL #2   Title Pt will amb. 300' over even terrain without an AD, IND, to safely amb. in home. Target date: 04/11/16   Status On-going   PT LONG TERM GOAL #3   Title Pt will improve gait speed to >/=2.676fsec, with LRAD, to amb. safely in the community. Target date: 11/12/14.   Status Achieved   PT LONG TERM GOAL  #4   Title Pt will verbalize plans to join a fitness center upon d/c from PT to continue to maintain strength and endurance gains made during PT. Target date: 04/11/16.   Status On-going   PT LONG TERM GOAL #5   Title Pt improve BERG  score to >/=55/56 to decr. falls risk. Target date: 12/11/15   Status Achieved   PT LONG TERM GOAL #6   Title Pt will ascend/descend curb with LRAD at MOD I level to improve functional mobility. Target date: 02/07/16   Status Achieved   PT LONG TERM GOAL #7   Title Pt will ascend/descend 12 steps no handrail at MOD I level, while carrying 20 lbs.l to improve functional mobility and carry child at home. Target date: 04/11/16   Status On-going   PT LONG TERM GOAL #8   Title Pt will ascend/descend ladder at MOD I level in order to transfer in/out of water. Target date: 04/11/16   Status On-going   PT LONG TERM GOAL  #9   TITLE Pt will be able to transfer on/off boat at MOD I level, and no LOB, in order improve functional mobility and participate in family activities. Target date: 04/11/16   Status On-going   PT LONG TERM GOAL  #10   TITLE Pt will be IIND in progressed HEP in order to improve balance, strength and flexibility. Target date: 02/07/16   Status Achieved   PT LONG TERM GOAL  #11   TITLE Pt will improve DGI score to >/=20/24 to decr. falls risk. Target date: 02/07/16   Status Achieved   PT LONG TERM GOAL  #12   TITLE Pt will improve FGA score to >/=25/30 to decr. falls risk. Target date: 04/11/16   Status On-going               Plan - 04/03/16 1223    Clinical Impression Statement Pt demonstrated progress, as she was able to perform R SLS activities with less assist and cues. Pt also performed balance master wt shifting activities with targets further from center, and support responsive, with less assist and cues during last trial. Pt continues to experience incr. R foot supination during gait over grassy terrain 2/2 weak R LE eversion. Continue with  POC.    Rehab Potential Good   Clinical Impairments Affecting Rehab Potential Seizures which may limit intensity of PT   PT Frequency 2x / week   PT Duration 8 weeks   PT Treatment/Interventions ADLs/Self Care Home Management;Neuromuscular re-education;Cognitive remediation;Biofeedback;DME Instruction;Gait training;Stair training;Canalith Repostioning;Patient/family education;Orthotic Fit/Training;Balance training;Therapeutic exercise;Manual techniques;Therapeutic activities;Vestibular   PT Next Visit Plan Check goals and renew. Continue R SLS, dance, ladder/boat activities, BWSTT   PT Home Exercise Plan Stretching/strength/balance HEP   Consulted and Agree with Plan of Care Patient      Patient will benefit from skilled therapeutic intervention in order to improve the following deficits and impairments:  Abnormal gait, Decreased endurance, Impaired sensation, Decreased knowledge of precautions, Decreased activity tolerance, Decreased knowledge of use of DME, Decreased strength, Impaired UE functional use, Impaired tone, Decreased balance, Decreased mobility, Decreased cognition, Decreased range of motion, Decreased safety awareness, Decreased coordination, Impaired flexibility, Postural dysfunction  Visit Diagnosis: Other abnormalities of gait and mobility  Spastic hemiplegia affecting right dominant side [facility])     Problem List Patient Active Problem List   Diagnosis Date Noted  . Spastic hemiplegia and hemiparesis affecting dominant side ([place]) 09/13/2015  . Adhesive capsulitis of right shoulder 08/24/2015  . Symptomatic partial epilepsy with simple partial seizures ([place]) 08/21/2015  . Nontraumatic cortical hemorrhage of cerebral hemisphere ([place])   . Seizure ([place])   . Seizures ([place])   . Adjustment disorder with depressed mood   . Contracture of muscle ankle and foot 06/11/2015  . Hemiplga fol ntrm intcrbl  hemor aff right dominant side 06/06/2015  . Left-sided intracerebral  hemorrhage ([place]) 06/04/2015  . Seizure disorder as sequela of cerebrovascular accident ([place])   . Seizure disorder ([place]) 06/03/2015  . Sepsis ([place]) 06/03/2015  . UTI (urinary tract infection) 06/03/2015  . Hypotension 06/02/2015  . Right spastic hemiparesis ([place]) 05/28/2015  . 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 History of anxiety disorder 05/28/2015  . ICH (intracerebral hemorrhage) ([place])   . Seizure disorder, nonconvulsive, with status epilepticus ([place])   . Cerebral venous thrombosis of cortical vein   . Cytotoxic cerebral edema ([place])   . IVH (intraventricular hemorrhage) ([place])   . Term pregnancy 05/07/2015  . Spontaneous vaginal delivery 05/07/2015    Jasime Westergren L 04/03/2016, 12:26 PM  [facility] [address] [place], [place], [address] Phone: [phone]   Fax:  [phone]  Name: Kharizma Lesnick MRN: [id number] Date of Birth: 1975-12-12     Geoffry Paradise, PT,DPT 04/03/2016 12:26 PM Phone: [phone] Fax: [phone]

## 2016-04-08 ENCOUNTER — Ambulatory Visit (INDEPENDENT_AMBULATORY_CARE_PROVIDER_SITE_OTHER): Payer: BLUE CROSS/BLUE SHIELD | Admitting: Psychology

## 2016-04-08 ENCOUNTER — Ambulatory Visit: Payer: BLUE CROSS/BLUE SHIELD | Admitting: Occupational Therapy

## 2016-04-08 ENCOUNTER — Encounter: Payer: Self-pay | Admitting: Occupational Therapy

## 2016-04-08 DIAGNOSIS — R2689 Other abnormalities of gait and mobility: Secondary | ICD-10-CM | POA: Diagnosis not present

## 2016-04-08 DIAGNOSIS — R293 Abnormal posture: Secondary | ICD-10-CM

## 2016-04-08 DIAGNOSIS — F4323 Adjustment disorder with mixed anxiety and depressed mood: Secondary | ICD-10-CM | POA: Diagnosis not present

## 2016-04-08 DIAGNOSIS — M25611 Stiffness of right shoulder, not elsewhere classified: Secondary | ICD-10-CM

## 2016-04-08 DIAGNOSIS — R208 Other disturbances of skin sensation: Secondary | ICD-10-CM

## 2016-04-08 DIAGNOSIS — G8111 Spastic hemiplegia affecting right dominant side: Secondary | ICD-10-CM

## 2016-04-08 DIAGNOSIS — R41844 Frontal lobe and executive function deficit: Secondary | ICD-10-CM

## 2016-04-08 NOTE — Patient Instructions (Signed)
Modifications to home program for your left arm:  1.  When laying on your back, use two 2 pound weights in each hand instead of one 5 pound weight. Reps stay at 20 with slow count of 5.  2.  In sitting can do 5 pound weight to 90* or 3 pound weight a little higher as long as you can maintain your elbow and not hike your shoulder.  3. When facing the wall and raising arms overhead, slide arm out like a snow angel - hold for slow count of 10 if possible. Gentle stretch!!

## 2016-04-08 NOTE — Progress Notes (Signed)
[facility]  [address]   Telephone [phone] [address] Fax [phone] [place], [place] [address]   Psychology Progress Note   Name:  Ashley [name] Date of Birth:  12/01/1975 MRN:  [id number]  Date:04/08/2016 (48m) psychotherapy Annalis spoke of how her difficulties with sustaining and dividing attention effect her daily functioning and create emotional distress. Having to deal with three young children sometimes strains her attentional capacities. Reviewed strategies to cope with the anxiety and frustration associated with her attentional difficulties.     Diagnostic Impression Adjustment disorder with nixed anxiety and depressed mood [F43.23]   Return in two weeks  Jamey Ripa, Ph.D Licensed Psychologist

## 2016-04-08 NOTE — Therapy (Signed)
[facility] [address] [place], [place], [address] Phone: [phone]   Fax:  [phone]  Occupational Therapy Treatment  Patient Details  Name: Ashley [name] MRN: [id number] Date of Birth: 20-Dec-1975 No Data Recorded  Encounter Date: 04/08/2016      OT End of Session - 04/08/16 1250    Visit Number 6   Number of Visits 5   Date for OT Re-Evaluation 04/08/16   Authorization Type BCBS   Authorization Time Period 30 visit COMBINED between PT/OT;  pt has utilized benefit and is now self pay.    OT Start Time 1015   OT Stop Time 1059   OT Time Calculation (min) 44 min   Activity Tolerance Patient tolerated treatment well      Past Medical History  Diagnosis Date  . Anxiety   . ICH (intracerebral hemorrhage) ([place])   . Stroke ([place])   . Seizures [facility])     Past Surgical History  Procedure Laterality Date  . Dilation and curettage of uterus      There were no vitals filed for this visit.      Subjective Assessment - 04/08/16 1024    Subjective  I can't wait for my driving eval   Pertinent History see epic snapshot; pt with [facility] 2 weeks after deliverying her 3rd child   Patient Stated Goals to be able to use my R arm normally   Currently in Pain? No/denies                      OT Treatments/Exercises (OP) - 04/08/16 0001    Exercises   Exercises Shoulder   Shoulder Exercises: Supine   Other Supine Exercises Updated HEP for exercises in supine and sitting.  See pt instruction for details. Pt able to return demonstrate all modifcation with full repetitions without difficulty and reports no shoulder pain.  No increase in existing tone in RUE.  Also discussed possible evaluation for botox injection to R pec area and pt agreeable to exploring this with MD during July appt.  Increased tone in pec limiting pt in full overhead reach out of synergy influence and restricting pt in gaining additional  proximal strength and stability.  Have also contacted MD and MD is aware of discussion.                 OT Education - 04/08/16 1239    Education provided Yes   Education Details Updated HEP for RUE          OT Short Term Goals - 04/08/16 1239    OT SHORT TERM GOAL  #11   TITLE Pt will be mod I with upgraded HEP - 01/08/2016   Status Achieved   OT SHORT TERM GOAL  #12   TITLE Pt will be able to pick up 8 pound object x 4 trials bilaterally to place on overhead shelf requiring at least 120* of shoulder flexion (for household mgmt tasks, grocery shopping ,etc)   Status Achieved   OT SHORT TERM GOAL  #13   TITLE Pt will be able to lift lightweight object unilaterally with RUE to 120* of shoulder flexion (baseline 115*)   Status Achieved  able to lift 4 pound object to this height with very minimal compensations   OT SHORT TERM GOAL  #14   TITLE Pt will be able to lift baby into and out of tub while in kneeling from and to baby seat using both  UE's - 03/11/2016   Status Achieved  Pt sat on gma's lap and pt able to lift baby from gma's lap into tub and then lift baby out of tub and hand to [place] - 04/08/16 1240    OT LONG TERM GOAL #1   Title Pt will demonstrate ability to carry 18 pound object for at least 600 feet during functional activity 04/08/2016   Status Achieved   OT LONG TERM GOAL #2   Title Pt will be mod I with sweeping kitchen floor using both UE's   Status Achieved   OT LONG TERM GOAL #3   Title Pt will be mod I stepping over ledge of baby gate holding items in her hands   Status Achieved   OT LONG TERM GOAL #4   Title Pt will demonstrate abilty to turn wheel of care functional radius using both hands   Status Achieved   OT LONG TERM GOAL #5   Title Pt wil demonstrate ability to carry 10 pound object in RUE up/down stairs using railing   Status Achieved   OT LONG TERM GOAL #6   Title Pt will identify components/skills/tasks  of potential return to work - 04/08/2016   Status Achieved   OT LONG TERM GOAL #7   Title Pt will demonstrate ability to carry 5 pound object up and down stairs without railing - 05/06/2016   Status On-going   OT LONG TERM GOAL #8   Title Pt will demonstrate 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  Pt continues to make good progress in terms of RUE functional use and mobility   Rehab Potential Good   Clinical Impairments Affecting Rehab Potential cognition, impaired sensation and anxiety   OT Frequency 2x / week   OT Duration 8 weeks   OT Treatment/Interventions Self-care/ADL training;Therapeutic exercise;Patient/family education;Balance training;Ultrasound;Neuromuscular education;Manual Therapy;Splinting;Therapeutic exercises;Energy conservation;Parrafin;DME and/or AE instruction;Therapeutic activities;Cognitive remediation/compensation;Gait Training;Fluidtherapy;Electrical Stimulation;Moist Heat;Contrast Bath;Passive range of motion;Visual/perceptual remediation/compensation   Plan NMR for overhead reach and strength, balance and trunk control in functional tasks   Consulted and Agree with Plan of Care Patient      Patient will benefit from skilled therapeutic intervention in order to improve the following deficits and impairments:  Abnormal gait, Decreased coordination, Decreased range of motion, Difficulty walking, Impaired flexibility, Decreased endurance, Decreased safety awareness, Increased edema, Impaired tone, Decreased knowledge of precautions, Decreased activity tolerance, Decreased balance, Decreased knowledge of use of DME, Pain, Impaired UE functional use, Impaired  vision/preception, Decreased cognition, Decreased mobility, Decreased strength  Visit Diagnosis: Other abnormalities of gait and mobility - Plan: Ot plan of care cert/re-cert  Spastic hemiplegia affecting right dominant side ([place]) - Plan: Ot plan of care cert/re-cert  Abnormal posture - Plan: Ot plan of care cert/re-cert  Frontal lobe and executive function deficit - Plan: Ot plan of care cert/re-cert  Other disturbances of skin sensation - Plan: Ot plan of care cert/re-cert  Stiffness of right shoulder, not elsewhere classified - Plan: Ot plan of care cert/re-cert  Problem List Patient Active Problem List   Diagnosis Date Noted  . Spastic hemiplegia and hemiparesis affecting dominant side ([place]) 09/13/2015  . Adhesive capsulitis of right shoulder 08/24/2015  . Symptomatic partial epilepsy with simple partial seizures ([place]) 08/21/2015  . Nontraumatic cortical hemorrhage of cerebral hemisphere ([place])   . Seizure ([place])   . Seizures ([place])   . Adjustment disorder with depressed mood   . Contracture of muscle ankle and foot 06/11/2015  . Hemiplga fol ntrm intcrbl hemor aff right dominant side 06/06/2015  . Left-sided intracerebral hemorrhage ([place]) 06/04/2015  . Seizure disorder as sequela of cerebrovascular accident ([place])   . Seizure disorder ([place]) 06/03/2015  . Sepsis ([place]) 06/03/2015  . UTI (urinary tract infection) 06/03/2015  . Hypotension 06/02/2015  . Right spastic hemiparesis ([place]) 05/28/2015  . 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 History of anxiety disorder 05/28/2015  . ICH (intracerebral hemorrhage) ([place])   . Seizure disorder, nonconvulsive, with status epilepticus ([place])   . Cerebral venous thrombosis of cortical vein   . Cytotoxic cerebral edema ([place])   . IVH (intraventricular hemorrhage) ([place])   . Term pregnancy 05/07/2015  . Spontaneous vaginal delivery 05/07/2015    Quay Burow, OTR/L 04/08/2016, 12:55 PM  [facility] [address] [place], [place], [address] Phone: [phone]   Fax:  [phone]  Name: Ashley [name] MRN: [id number] Date of Birth: 23-Jun-1976

## 2016-04-10 ENCOUNTER — Encounter: Payer: Self-pay | Admitting: Occupational Therapy

## 2016-04-10 ENCOUNTER — Ambulatory Visit: Payer: BLUE CROSS/BLUE SHIELD | Admitting: Occupational Therapy

## 2016-04-10 DIAGNOSIS — R208 Other disturbances of skin sensation: Secondary | ICD-10-CM

## 2016-04-10 DIAGNOSIS — G8111 Spastic hemiplegia affecting right dominant side: Secondary | ICD-10-CM

## 2016-04-10 DIAGNOSIS — M25611 Stiffness of right shoulder, not elsewhere classified: Secondary | ICD-10-CM

## 2016-04-10 DIAGNOSIS — R2689 Other abnormalities of gait and mobility: Secondary | ICD-10-CM | POA: Diagnosis not present

## 2016-04-10 DIAGNOSIS — R293 Abnormal posture: Secondary | ICD-10-CM

## 2016-04-10 DIAGNOSIS — R41844 Frontal lobe and executive function deficit: Secondary | ICD-10-CM

## 2016-04-10 NOTE — Therapy (Signed)
[facility] [address] [place], [place], [address] Phone: [phone]   Fax:  [phone]  Occupational Therapy Treatment  Patient Details  Name: Ashley [name] MRN: [id number] Date of Birth: 1976/04/17 No Data Recorded  Encounter Date: 04/10/2016      OT End of Session - 04/10/16 1218    Visit Number 47   Number of Visits 57   Date for OT Re-Evaluation 05/06/16   Authorization Type BCBS   Authorization Time Period 30 visit COMBINED between PT/OT;  pt has utilized benefit and is now self pay.    OT Start Time 1016   OT Stop Time 1101   OT Time Calculation (min) 45 min   Activity Tolerance Patient tolerated treatment well      Past Medical History  Diagnosis Date  . Anxiety   . ICH (intracerebral hemorrhage) ([place])   . Stroke ([place])   . Seizures [facility])     Past Surgical History  Procedure Laterality Date  . Dilation and curettage of uterus      There were no vitals filed for this visit.      Subjective Assessment - 04/10/16 1021    Subjective  I am working hard to hel;p my mom understand that she needs to allow me to be more independent   Pertinent History see epic snapshot; pt with [facility] 2 weeks after deliverying her 3rd child   Patient Stated Goals to be able to use my R arm normally   Currently in Pain? No/denies                      OT Treatments/Exercises (OP) - 04/10/16 0001    ADLs   Functional Mobility Addressed pt's abilty to carry 5 pound object up and down stairs without railing. Pt requires only supervision and vc's at this time.   Neurological Re-education Exercises   Other Exercises 1 Neuro re ed to address RUE overhead reach with external rotation and abduction - addressed reach pattern as well as strengthening with resistance through activity and functional actiivty. Pt slowly improving however tone in pec muscle limits pt.                   OT Short Term Goals  - 04/10/16 1216    OT SHORT TERM GOAL  #11   TITLE Pt will be mod I with upgraded HEP - 01/08/2016   Status Achieved   OT SHORT TERM GOAL  #12   TITLE Pt will be able to pick up 8 pound object x 4 trials bilaterally to place on overhead shelf requiring at least 120* of shoulder flexion (for household mgmt tasks, grocery shopping ,etc)   Status Achieved   OT SHORT TERM GOAL  #13   TITLE Pt will be able to lift lightweight object unilaterally with RUE to 120* of shoulder flexion (baseline 115*)   Status Achieved  able to lift 4 pound object to this height with very minimal compensations   OT SHORT TERM GOAL  #14   TITLE Pt will be able to lift baby into and out of tub while in kneeling from and to baby seat using both UE's - 03/11/2016   Status Achieved  Pt sat on gma's lap and pt able to lift baby from gma's lap into tub and then lift baby out of tub and hand to gma            OT Long Term Goals -  04/10/16 1217    OT LONG TERM GOAL #1   Title Pt will demonstrate ability to carry 18 pound object for at least 600 feet during functional activity 04/08/2016   Status Achieved   OT LONG TERM GOAL #2   Title Pt will be mod I with sweeping kitchen floor using both UE's   Status Achieved   OT LONG TERM GOAL #3   Title Pt will be mod I stepping over ledge of baby gate holding items in her hands   Status Achieved   OT LONG TERM GOAL #4   Title Pt will demonstrate abilty to turn wheel of care functional radius using both hands   Status Achieved   OT LONG TERM GOAL #5   Title Pt wil demonstrate ability to carry 10 pound object in RUE up/down stairs using railing   Status Achieved   OT LONG TERM GOAL #6   Title Pt will identify components/skills/tasks of potential return to work - 04/08/2016   Status Achieved   OT LONG TERM GOAL #7   Title Pt will demonstrate ability to carry 5 pound object up and down stairs without railing - 05/06/2016   Status On-going   OT LONG TERM GOAL #8   Title Pt  will demonstrate 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 Pt and family going on vacation this upcoming week.   Rehab Potential Good   Clinical Impairments Affecting Rehab Potential cognition, impaired sensation and anxiety   OT Frequency 2x / week   OT Duration 8 weeks   OT Treatment/Interventions Self-care/ADL training;Therapeutic exercise;Patient/family education;Balance training;Ultrasound;Neuromuscular education;Manual Therapy;Splinting;Therapeutic exercises;Energy conservation;Parrafin;DME and/or AE instruction;Therapeutic activities;Cognitive remediation/compensation;Gait Training;Fluidtherapy;Electrical Stimulation;Moist Heat;Contrast Bath;Passive range of motion;Visual/perceptual remediation/compensation   Plan NMR for overhead reach and strength, balance and trunk control   Consulted and Agree with Plan of Care Patient      Patient will benefit from skilled therapeutic intervention in order to improve the following deficits and impairments:  Abnormal gait, Decreased coordination, Decreased range of motion, Difficulty walking, Impaired flexibility, Decreased endurance, Decreased safety awareness, Increased edema, Impaired tone, Decreased knowledge of precautions, Decreased activity tolerance, Decreased balance, Decreased knowledge of use of DME, Pain, Impaired UE functional use, Impaired vision/preception, Decreased cognition, Decreased mobility, Decreased strength  Visit Diagnosis: Other abnormalities of gait and mobility  Spastic hemiplegia affecting right dominant side (HCC)  Abnormal posture  Frontal lobe and executive function deficit  Other disturbances of  skin sensation  Stiffness of right shoulder, not elsewhere classified    Problem List Patient Active Problem List   Diagnosis Date Noted  . Spastic hemiplegia and hemiparesis affecting dominant side ([place]) 09/13/2015  . Adhesive capsulitis of right shoulder 08/24/2015  . Symptomatic partial epilepsy with simple partial seizures ([place]) 08/21/2015  . Nontraumatic cortical hemorrhage of cerebral hemisphere ([place])   . Seizure ([place])   . Seizures ([place])   . Adjustment disorder with depressed mood   . Contracture of muscle ankle and foot 06/11/2015  . Hemiplga fol ntrm intcrbl hemor aff right dominant side 06/06/2015  . Left-sided intracerebral hemorrhage ([place]) 06/04/2015  .  Seizure disorder as sequela of cerebrovascular accident ([place])   . Seizure disorder ([place]) 06/03/2015  . Sepsis ([place]) 06/03/2015  . UTI (urinary tract infection) 06/03/2015  . Hypotension 06/02/2015  . Right spastic hemiparesis ([place]) 05/28/2015  . 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 History of anxiety disorder 05/28/2015  . ICH (intracerebral hemorrhage) ([place])   . Seizure disorder, nonconvulsive, with status epilepticus ([place])   . Cerebral venous thrombosis of cortical vein   . Cytotoxic cerebral edema ([place])   . IVH (intraventricular hemorrhage) ([place])   . Term pregnancy 05/07/2015  . Spontaneous vaginal delivery 05/07/2015    Quay Burow, OTR/L 04/10/2016, 12:20 PM  [facility] [address] [place], [place], [address] Phone: [phone]   Fax:  [phone]  Name: Ashley [name] MRN: [id number] Date of Birth: 1976/04/17

## 2016-04-13 ENCOUNTER — Other Ambulatory Visit: Payer: Self-pay | Admitting: Neurology

## 2016-04-17 ENCOUNTER — Ambulatory Visit: Payer: BLUE CROSS/BLUE SHIELD

## 2016-04-17 ENCOUNTER — Encounter: Payer: BLUE CROSS/BLUE SHIELD | Admitting: Occupational Therapy

## 2016-04-18 ENCOUNTER — Ambulatory Visit: Payer: BLUE CROSS/BLUE SHIELD | Attending: Psychology

## 2016-04-18 DIAGNOSIS — F4323 Adjustment disorder with mixed anxiety and depressed mood: Secondary | ICD-10-CM | POA: Insufficient documentation

## 2016-04-18 DIAGNOSIS — M25611 Stiffness of right shoulder, not elsewhere classified: Secondary | ICD-10-CM | POA: Diagnosis present

## 2016-04-18 DIAGNOSIS — R2689 Other abnormalities of gait and mobility: Secondary | ICD-10-CM | POA: Insufficient documentation

## 2016-04-18 DIAGNOSIS — R41844 Frontal lobe and executive function deficit: Secondary | ICD-10-CM | POA: Insufficient documentation

## 2016-04-18 DIAGNOSIS — G8111 Spastic hemiplegia affecting right dominant side: Secondary | ICD-10-CM | POA: Diagnosis present

## 2016-04-18 DIAGNOSIS — R208 Other disturbances of skin sensation: Secondary | ICD-10-CM | POA: Diagnosis present

## 2016-04-18 DIAGNOSIS — R293 Abnormal posture: Secondary | ICD-10-CM | POA: Insufficient documentation

## 2016-04-18 NOTE — Therapy (Signed)
[facility] [address] [place], [place], [address] Phone: [phone]   Fax:  [phone]  Physical Therapy Treatment  Patient Details  Name: Ashley [name] MRN: [id number] Date of Birth: Jul 22, 1976 No Data Recorded  Encounter Date: 04/18/2016      PT End of Session - 04/18/16 1418    Visit Number 72   Number of Visits 80   Date for PT Re-Evaluation 05/16/16  PT requesting add'l 5 weeks   Authorization Type BCBS. Per Laverda Page, visits now 30 for PT and OT total (15 per OT and 15 per PT). Pt wishes to continue at self pay after visit limit met.    Authorization - Visit Number 22   Authorization - Number of Visits 16   PT Start Time 1316   PT Stop Time 1358   PT Time Calculation (min) 42 min   Equipment Utilized During Treatment --  min guard to S for safety   Activity Tolerance Patient tolerated treatment well   Behavior During Therapy [facility] for tasks assessed/performed      Past Medical History  Diagnosis Date  . Anxiety   . ICH (intracerebral hemorrhage) ([place])   . Stroke ([place])   . Seizures [facility])     Past Surgical History  Procedure Laterality Date  . Dilation and curettage of uterus      There were no vitals filed for this visit.      Subjective Assessment - 04/18/16 1321    Subjective Pt denied falls or changes since last visit.    Pertinent History Seizures, low BP   Patient Stated Goals "Be as close to normal as much as possible and to go back to running" Get back to taking care of my kids, 41 weeks old and 79 and 41 years old   Currently in Pain? No/denies            [facility] PT Assessment - 04/18/16 1324    Functional Gait  Assessment   Gait assessed  Yes   Gait Level Surface Walks 20 ft in less than 7 sec but greater than 5.5 sec, uses assistive device, slower speed, mild gait deviations, or deviates 6-10 in outside of the 12 in walkway width.   Change in Gait Speed Able to change speed,  demonstrates mild gait deviations, deviates 6-10 in outside of the 12 in walkway width, or no gait deviations, unable to achieve a major change in velocity, or uses a change in velocity, or uses an assistive device.   Gait with Horizontal Head Turns Performs head turns smoothly with no change in gait. Deviates no more than 6 in outside 12 in walkway width   Gait with Vertical Head Turns Performs head turns with no change in gait. Deviates no more than 6 in outside 12 in walkway width.   Gait and Pivot Turn Pivot turns safely within 3 sec and stops quickly with no loss of balance.   Step Over Obstacle Is able to step over one shoe box (4.5 in total height) without changing gait speed. No evidence of imbalance.   Gait with Narrow Base of Support Ambulates 4-7 steps.  5 steps   Gait with Eyes Closed Walks 20 ft, no assistive devices, good speed, no evidence of imbalance, normal gait pattern, deviates no more than 6 in outside 12 in walkway width. Ambulates 20 ft in less than 7 sec.   Ambulating Backwards Walks 20 ft, slow speed, abnormal gait pattern, evidence for imbalance,  deviates 10-15 in outside 12 in walkway width.  R toes catching but pt did not experience LOB   Steps Alternating feet, must use rail.  intermittent use of rail.   Total Score 22   FGA comment: R RFO not donned during FGA.                     [facility] Adult PT Treatment/Exercise - 04/18/16 1324    Ambulation/Gait   Ambulation/Gait Yes   Ambulation/Gait Assistance 5: Supervision;4: Min guard;6: Modified independent (Device/Increase time)   Ambulation/Gait Assistance Details MOD I over even terrain indoors. S to min guard over outdoor/grasst terrain. Cues to improve R hip proraction, incr. RLE stance time and decr. R UE synergy. R AFO not donned.   Ambulation Distance (Feet) 1400 Feet   Assistive device None   Gait Pattern Decreased stance time - right;Decreased weight shift to right;Right foot flat;Decreased hip/knee  flexion - right;Step-through pattern;Decreased step length - left;Trunk rotated posteriorly on right   Ambulation Surface Level;Unlevel;Indoor;Outdoor;Paved;Grass                PT Education - 04/18/16 1416    Education provided Yes   Education Details PT discussed goal progress and renewal.  Pt agreeable. PT reiterated the importance of improving wt. shifting to RLE during gait, as pt's gait deviations incr. without R AFO donned.    Person(s) Educated Patient   Methods Explanation   Comprehension Verbalized understanding          PT Short Term Goals - 03/06/16 0903    PT SHORT TERM GOAL #1   Title Pt will be IND in HEP to improve strength, balance, and endurance. Target date: 01/10/16.   Status Achieved   PT SHORT TERM GOAL #2   Title Pt will improve gait speed to >/=1.83f/sec to decrease falls risk. Target date: 07/27/15.   Status Achieved   PT SHORT TERM GOAL #3   Title Pt will ambulate 300' with LRAD, over even terrain, at MOD I level to improve functional mobility. Target date: 07/27/15.   Status Achieved   PT SHORT TERM GOAL #4   Title Pt will perform TUG in </=13.5 seconds with LRAD to decr. falls risk. Target date: 01/10/15.   Status Achieved   PT SHORT TERM GOAL #5   Title Perform BERG and write STG and LTG as appropriate. Target date: 07/27/15.   Status Achieved   PT SHORT TERM GOAL #6   Title Pt will improve BERG to >/=51/56 to decr. falls risk. Target date: 07/27/15.   Status Achieved   PT SHORT TERM GOAL #7   Title Pt will amb. 500' over even/uneven terrain with LRAD at MOD I level to improve functional mobility. Target date: 01/10/16   Status Partially Met   PT SHORT TERM GOAL #8   Title Pt will ascend/descend curb with LRAD at MOD I level to improve functional mobility. Target date: 01/10/16   Status Partially Met   PT SHORT TERM GOAL #9   TITLE Pt will ascend/descend 12 steps with one handrail at MOD I level to improve functional mobility. Target date:  01/10/16   Status Achieved   PT SHORT TERM GOAL #10   TITLE Pt will incr. FGA score to >/=20/24 to decr. falls risk. Target date: 03/10/16   Status Partially Met   PT SHORT TERM GOAL #11   TITLE Pt will dance for 5 minutes with S and no LOB  to dance with family. Target date:  03/10/16   Status Partially Met   PT SHORT TERM GOAL #12   TITLE Pt will jump x5 reps with S to play with children at home. Target date: 03/10/16   Status Achieved   PT SHORT TERM GOAL #13   TITLE Pt will transfer into bed with use of stool (height 35") with S to improve functional mobility at home. Target date: 03/10/16   Status Achieved           PT Long Term Goals - 04/18/16 1425    PT LONG TERM GOAL #1   Title Pt will ambulate 1000' over even/uneven terrain, with LRAD, at MOD I level to improve functional mobiliity. Target date: 02/07/16/17.   Baseline All unmet LTGs will be carried over to new 5 week POC: 05/16/16   Status Partially Met   PT LONG TERM GOAL #2   Title Pt will amb. 300' over even terrain without an AD, IND, to safely amb. in home. Target date: 04/11/16   Status Achieved   PT LONG TERM GOAL #3   Title Pt will improve gait speed to >/=2.37f/sec, with LRAD, to amb. safely in the community. Target date: 11/12/14.   Status Achieved   PT LONG TERM GOAL #4   Title Pt will verbalize plans to join a fitness center upon d/c from PT to continue to maintain strength and endurance gains made during PT. Target date: 04/11/16.   Status On-going   PT LONG TERM GOAL #5   Title Pt improve BERG score to >/=55/56 to decr. falls risk. Target date: 12/11/15   Status Achieved   PT LONG TERM GOAL #6   Title Pt will ascend/descend curb with LRAD at MOD I level to improve functional mobility. Target date: 02/07/16   Status Achieved   PT LONG TERM GOAL #7   Title Pt will ascend/descend 12 steps no handrail at MOD I level, while carrying 20 lbs.l to improve functional mobility and carry child at home. Target date: 04/11/16    Status Partially Met   PT LONG TERM GOAL #8   Title Pt will ascend/descend ladder at MOD I level in order to transfer in/out of water. Target date: 04/11/16   Status Deferred   PT LONG TERM GOAL  #9   TITLE Pt will be able to transfer on/off boat at MOD I level, and no LOB, in order improve functional mobility and participate in family activities. Target date: 04/11/16   Status Deferred   PT LONG TERM GOAL  #10   TITLE Pt will be IIND in progressed HEP in order to improve balance, strength and flexibility. Target date: 02/07/16   Status Achieved   PT LONG TERM GOAL  #11   TITLE Pt will improve DGI score to >/=20/24 to decr. falls risk. Target date: 02/07/16   Status Achieved   PT LONG TERM GOAL  #12   TITLE Pt will improve FGA score to >/=25/30 to decr. falls risk. Target date: 04/11/16   Status On-going               Plan - 04/18/16 1419    Clinical Impression Statement Pt demonstrated progress, as she met LTG 2 and partially LTGs 1, 7, and 12. LTGs 8 and 9 deferred as pt has not been out on boat in over a month. Pt continues to experience incr. in gait deviations during amb. without R AFO donned 2/2 weakness. Pt is able to correct deviations with cues. Pt would continue to benefit from  skilled PT to improve safety during functional mobiility. PT requesting 2x/week for 5 weeks.    Rehab Potential Good   Clinical Impairments Affecting Rehab Potential Seizures which may limit intensity of PT   PT Frequency 2x / week   PT Duration 8 weeks   PT Treatment/Interventions ADLs/Self Care Home Management;Neuromuscular re-education;Cognitive remediation;Biofeedback;DME Instruction;Gait training;Stair training;Canalith Repostioning;Patient/family education;Orthotic Fit/Training;Balance training;Therapeutic exercise;Manual techniques;Therapeutic activities;Vestibular   PT Next Visit Plan High level balance, gait without R AFO (BWSTT).    PT Home Exercise Plan Stretching/strength/balance HEP    Consulted and Agree with Plan of Care Patient      Patient will benefit from skilled therapeutic intervention in order to improve the following deficits and impairments:  Abnormal gait, Decreased endurance, Impaired sensation, Decreased knowledge of precautions, Decreased activity tolerance, Decreased knowledge of use of DME, Decreased strength, Impaired UE functional use, Impaired tone, Decreased balance, Decreased mobility, Decreased cognition, Decreased range of motion, Decreased safety awareness, Decreased coordination, Impaired flexibility, Postural dysfunction  Visit Diagnosis: Other abnormalities of gait and mobility - Plan: PT plan of care cert/re-cert  Spastic hemiplegia affecting right dominant side (HCC) - Plan: PT plan of care cert/re-cert  Abnormal posture - Plan: PT plan of care cert/re-cert  Other disturbances of skin sensation - Plan: PT plan of care cert/re-cert     Problem List Patient Active Problem List   Diagnosis Date Noted  . Spastic hemiplegia and hemiparesis affecting dominant side ([place]) 09/13/2015  . Adhesive capsulitis of right shoulder 08/24/2015  . Symptomatic partial epilepsy with simple partial seizures ([place]) 08/21/2015  . Nontraumatic cortical hemorrhage of cerebral hemisphere ([place])   . Seizure ([place])   . Seizures ([place])   . Adjustment disorder with depressed mood   . Contracture of muscle ankle and foot 06/11/2015  . Hemiplga fol ntrm intcrbl hemor aff right dominant side 06/06/2015  . Left-sided intracerebral hemorrhage ([place]) 06/04/2015  . Seizure disorder as sequela of cerebrovascular accident ([place])   . Seizure disorder ([place]) 06/03/2015  . Sepsis ([place]) 06/03/2015  . UTI (urinary tract infection) 06/03/2015  . Hypotension 06/02/2015  . Right spastic hemiparesis ([place]) 05/28/2015  . 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 History of anxiety disorder 05/28/2015  . ICH (intracerebral hemorrhage) ([place])   . Seizure  disorder, nonconvulsive, with status epilepticus ([place])   . Cerebral venous thrombosis of cortical vein   . Cytotoxic cerebral edema ([place])   . IVH (intraventricular hemorrhage) ([place])   . Term pregnancy 05/07/2015  . Spontaneous vaginal delivery 05/07/2015    Daniesha Driver L 04/18/2016, 2:30 PM  [facility] [address] [place], [place], [address] Phone: [phone]   Fax:  [phone]  Name: Lyndell Gillyard MRN: [id number] Date of Birth: 07/16/1976    Geoffry Paradise, PT,DPT 04/18/2016 2:30 PM Phone: [phone] Fax: [phone]

## 2016-04-22 ENCOUNTER — Ambulatory Visit: Payer: BLUE CROSS/BLUE SHIELD

## 2016-04-22 ENCOUNTER — Ambulatory Visit: Payer: BLUE CROSS/BLUE SHIELD | Admitting: Occupational Therapy

## 2016-04-22 ENCOUNTER — Encounter: Payer: Self-pay | Admitting: Occupational Therapy

## 2016-04-22 DIAGNOSIS — G8111 Spastic hemiplegia affecting right dominant side: Secondary | ICD-10-CM

## 2016-04-22 DIAGNOSIS — R2689 Other abnormalities of gait and mobility: Secondary | ICD-10-CM

## 2016-04-22 DIAGNOSIS — R41844 Frontal lobe and executive function deficit: Secondary | ICD-10-CM

## 2016-04-22 DIAGNOSIS — R208 Other disturbances of skin sensation: Secondary | ICD-10-CM

## 2016-04-22 DIAGNOSIS — M25611 Stiffness of right shoulder, not elsewhere classified: Secondary | ICD-10-CM

## 2016-04-22 DIAGNOSIS — R293 Abnormal posture: Secondary | ICD-10-CM

## 2016-04-22 NOTE — Patient Instructions (Signed)
Add these two to your exercise/stretch program.  1. Stand beside the wall with your right arm on the wall, elbow straight with the back of your hand against the wall. Think of a clock (you are the clock!).  Put your hand at 7:00 - keeping elbow straight push in to the wall and hold for a slow count of 5  Do 5. Then put your hand at 9:00.  Do 5.  Then put your hand at 10:00. Do 5. Once you place your hand, makes sure you shift more weight onto your right foot to keep your arm in alignment.  This should not cause pain and don't over push.    2. Stretch- lay on the floor without a pillow. Put hands behind your head, elbows bent and pointing toward the ceiling.  Keep hands behind your head and slowly lower elbows toward the floor.  HOLD FOR SLOW COUNT OF 10. Return to starting position.  Do 10, rest then do 10 more.  You can do these 2 times per day if you have time.  You can also do throughout the day if your arm feels tight.

## 2016-04-22 NOTE — Therapy (Signed)
[facility] [address] [place], [place], [address] Phone: [phone]   Fax:  [phone]  Occupational Therapy Treatment  Patient Details  Name: Ashley [name] MRN: [id number] Date of Birth: 02-12-1976 No Data Recorded  Encounter Date: 04/22/2016      OT End of Session - 04/22/16 1438    Visit Number 47   Number of Visits 44   Date for OT Re-Evaluation 05/06/16   Authorization Type BCBS   Authorization Time Period 30 visit COMBINED between PT/OT;  pt has utilized benefit and is now self pay.    OT Start Time 1102   OT Stop Time 1145   OT Time Calculation (min) 43 min   Activity Tolerance Patient tolerated treatment well      Past Medical History  Diagnosis Date  . Anxiety   . ICH (intracerebral hemorrhage) ([place])   . Stroke ([place])   . Seizures [facility])     Past Surgical History  Procedure Laterality Date  . Dilation and curettage of uterus      There were no vitals filed for this visit.      Subjective Assessment - 04/22/16 1105    Subjective  I passed my driving eval yesterday and drove my kids to camp today!   Pertinent History see epic snapshot; pt with [facility] 2 weeks after deliverying her 3rd child   Patient Stated Goals to be able to use my R arm normally                      OT Treatments/Exercises (OP) - 04/22/16 0001    Neurological Re-education Exercises   Other Exercises 1 Neuro re ed to address scapular stability, stable mobility in unilateral closed chain activities with resistance. Adjusted HEP to include activities focused on that as well as added one additonal stretching activity. Pt has been on vacation this past week and did not get an opportunity to complete exercises - discussed ways to build in stretching during the day in brief easy spurts. Pt verbalized understanding. Pt to see rehab MD next Monday to discuss possible botox to R pec to assist with gaining end ROM for  overhead reach and strength in that position.                OT Education - 04/22/16 1436    Education provided Yes   Education Details upgraded HEP for [employer]) Educated Patient   Methods Explanation;Demonstration;Verbal cues;Handout   Comprehension Verbalized understanding;Returned demonstration          OT Short Term Goals - 04/22/16 1436    OT SHORT TERM GOAL  #11   TITLE Pt will be mod I with upgraded HEP - 01/08/2016   Status Achieved   OT SHORT TERM GOAL  #12   TITLE Pt will be able to pick up 8 pound object x 4 trials bilaterally to place on overhead shelf requiring at least 120* of shoulder flexion (for household mgmt tasks, grocery shopping ,etc)   Status Achieved   OT SHORT TERM GOAL  #13   TITLE Pt will be able to lift lightweight object unilaterally with RUE to 120* of shoulder flexion (baseline 115*)   Status Achieved  able to lift 4 pound object to this height with very minimal compensations   OT SHORT TERM GOAL  #14   TITLE Pt will be able to lift baby into and out of tub while in kneeling from and to  baby seat using both UE's - 03/11/2016   Status Achieved  Pt sat on gma's lap and pt able to lift baby from gma's lap into tub and then lift baby out of tub and hand to [place] - 04/22/16 1436    OT LONG TERM GOAL #1   Title Pt will demonstrate ability to carry 18 pound object for at least 600 feet during functional activity 04/08/2016   Status Achieved   OT LONG TERM GOAL #2   Title Pt will be mod I with sweeping kitchen floor using both UE's   Status Achieved   OT LONG TERM GOAL #3   Title Pt will be mod I stepping over ledge of baby gate holding items in her hands   Status Achieved   OT LONG TERM GOAL #4   Title Pt will demonstrate abilty to turn wheel of care functional radius using both hands   Status Achieved   OT LONG TERM GOAL #5   Title Pt wil demonstrate ability to carry 10 pound object in RUE up/down  stairs using railing   Status Achieved   OT LONG TERM GOAL #6   Title Pt will identify components/skills/tasks of potential return to work - 04/08/2016   Status Achieved   OT LONG TERM GOAL #7   Title Pt will demonstrate ability to carry 5 pound object up and down stairs without railing - 05/06/2016   Status On-going   OT LONG TERM GOAL #8   Title Pt will demonstrate 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 Pt is also now driving independently after driving evaluation yesterday   Rehab Potential Good   Clinical Impairments Affecting Rehab Potential cognition, impaired sensation and anxiety   OT Frequency 2x / week   OT Duration 8 weeks   OT Treatment/Interventions Self-care/ADL training;Therapeutic exercise;Patient/family education;Balance training;Ultrasound;Neuromuscular education;Manual Therapy;Splinting;Therapeutic exercises;Energy conservation;Parrafin;DME and/or AE instruction;Therapeutic activities;Cognitive remediation/compensation;Gait Training;Fluidtherapy;Electrical Stimulation;Moist Heat;Contrast Bath;Passive range of motion;Visual/perceptual remediation/compensation   Plan NMR for overhead reach and strength, balance and trunk control   Consulted and Agree with Plan of Care Patient      Patient will benefit from skilled therapeutic intervention in order to improve the following deficits and impairments:  Abnormal gait, Decreased coordination, Decreased range of motion, Difficulty walking, Impaired flexibility, Decreased endurance, Decreased safety awareness, Increased edema, Impaired tone, Decreased knowledge of precautions, Decreased activity tolerance, Decreased  balance, Decreased knowledge of use of DME, Pain, Impaired UE functional use, Impaired vision/preception, Decreased cognition, Decreased mobility, Decreased strength  Visit Diagnosis: Other abnormalities of gait and mobility  Spastic hemiplegia affecting right dominant side (HCC)  Abnormal posture  Other disturbances of skin sensation  Frontal lobe and executive function deficit  Stiffness of right shoulder, not elsewhere classified    Problem List Patient Active Problem List   Diagnosis Date Noted  . Spastic hemiplegia and hemiparesis affecting dominant side ([place]) 09/13/2015  . Adhesive capsulitis of right shoulder 08/24/2015  . Symptomatic partial epilepsy with simple partial seizures ([place]) 08/21/2015  .  Nontraumatic cortical hemorrhage of cerebral hemisphere ([place])   . Seizure ([place])   . Seizures ([place])   . Adjustment disorder with depressed mood   . Contracture of muscle ankle and foot 06/11/2015  . Hemiplga fol ntrm intcrbl hemor aff right dominant side 06/06/2015  . Left-sided intracerebral hemorrhage ([place]) 06/04/2015  . Seizure disorder as sequela of cerebrovascular accident ([place])   . Seizure disorder ([place]) 06/03/2015  . Sepsis ([place]) 06/03/2015  . UTI (urinary tract infection) 06/03/2015  . Hypotension 06/02/2015  . Right spastic hemiparesis ([place]) 05/28/2015  . 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 History of anxiety disorder 05/28/2015  . ICH (intracerebral hemorrhage) ([place])   . Seizure disorder, nonconvulsive, with status epilepticus ([place])   . Cerebral venous thrombosis of cortical vein   . Cytotoxic cerebral edema ([place])   . IVH (intraventricular hemorrhage) ([place])   . Term pregnancy 05/07/2015  . Spontaneous vaginal delivery 05/07/2015    Quay Burow, OTR/L 04/22/2016, 2:39 PM  [facility] [address] [place], [place], [address] Phone: [phone]   Fax:   [phone]  Name: Ashley [name] MRN: [id number] Date of Birth: 04-Apr-1976

## 2016-04-22 NOTE — Therapy (Signed)
[facility] [address] [place], [place], [address] Phone: [phone]   Fax:  [phone]  Physical Therapy Treatment  Patient Details  Name: Ashley [name] MRN: [id number] Date of Birth: 05-23-1976 No Data Recorded  Encounter Date: 04/22/2016      PT End of Session - 04/22/16 1303    Visit Number 77   Number of Visits 69   Date for PT Re-Evaluation 05/16/16   Authorization Type BCBS. Per Laverda Page, visits now 30 for PT and OT total (15 per OT and 15 per PT). Pt wishes to continue at self pay after visit limit met.    Authorization - Visit Number 32   Authorization - Number of Visits 16   PT Start Time 6606   PT Stop Time 1229   PT Time Calculation (min) 41 min   Equipment Utilized During Treatment --  BWSTT harness   Activity Tolerance Patient tolerated treatment well   Behavior During Therapy WFL for tasks assessed/performed      Past Medical History  Diagnosis Date  . Anxiety   . ICH (intracerebral hemorrhage) ([place])   . Stroke ([place])   . Seizures [facility])     Past Surgical History  Procedure Laterality Date  . Dilation and curettage of uterus      There were no vitals filed for this visit.      Subjective Assessment - 04/22/16 1151    Subjective Pt denied falls or changes since last visit. Pt had driving eval yesterday and passed.    Pertinent History Seizures, low BP   Patient Stated Goals "Be as close to normal as much as possible and to go back to running" Get back to taking care of my kids, 48 weeks old and 39 and 35 years old   Currently in Pain? No/denies                         [facility] Adult PT Treatment/Exercise - 04/22/16 1152    Ambulation/Gait   Ambulation/Gait Yes   Ambulation/Gait Assistance 5: Supervision;4: Min assist;6: Modified independent (Device/Increase time)   Ambulation/Gait Assistance Details MOD I over even terrain and S over paved surfaces. Pt required min  gaurd to min A (during 1 LOB) while amb. over compliant surfaces and traversing cones. Cues to improve R UE flexion synergy pattern, heel strike, and to improve hip/knee flexion to decr. R LE circumduction. Pt amb. also over compliant surfaces with and without traversing cones. 8 minutes with 2 minutes warm-up: BWSTT at 2.46mh and 0.267mes, with cues and assist to improve R ankle DF and R hip protraction.   Ambulation Distance (Feet) 600 Feet  outdoors, 230' indoors and 15'x10   Assistive device None;Body weight support system   Gait Pattern Decreased stance time - right;Decreased weight shift to right;Right foot flat;Decreased hip/knee flexion - right;Step-through pattern;Decreased step length - left;Trunk rotated posteriorly on right   Ambulation Surface Level;Unlevel;Paved;Outdoor;Indoor;Other (comment)  compliant surfaces                PT Education - 04/22/16 1302    Education provided Yes   Education Details PT discussed bringing HEP next session to progress. PT also discussed holding PT after this POC is done, in order for pt to trial home/community and then resuming therapy with new goals.   Person(s) Educated Patient   Methods Explanation   Comprehension Verbalized understanding          PT Short  Term Goals - 03/06/16 0903    PT SHORT TERM GOAL #1   Title Pt will be IND in HEP to improve strength, balance, and endurance. Target date: 01/10/16.   Status Achieved   PT SHORT TERM GOAL #2   Title Pt will improve gait speed to >/=1.81f/sec to decrease falls risk. Target date: 07/27/15.   Status Achieved   PT SHORT TERM GOAL #3   Title Pt will ambulate 300' with LRAD, over even terrain, at MOD I level to improve functional mobility. Target date: 07/27/15.   Status Achieved   PT SHORT TERM GOAL #4   Title Pt will perform TUG in </=13.5 seconds with LRAD to decr. falls risk. Target date: 01/10/15.   Status Achieved   PT SHORT TERM GOAL #5   Title Perform BERG and write STG  and LTG as appropriate. Target date: 07/27/15.   Status Achieved   PT SHORT TERM GOAL #6   Title Pt will improve BERG to >/=51/56 to decr. falls risk. Target date: 07/27/15.   Status Achieved   PT SHORT TERM GOAL #7   Title Pt will amb. 500' over even/uneven terrain with LRAD at MOD I level to improve functional mobility. Target date: 01/10/16   Status Partially Met   PT SHORT TERM GOAL #8   Title Pt will ascend/descend curb with LRAD at MOD I level to improve functional mobility. Target date: 01/10/16   Status Partially Met   PT SHORT TERM GOAL #9   TITLE Pt will ascend/descend 12 steps with one handrail at MOD I level to improve functional mobility. Target date: 01/10/16   Status Achieved   PT SHORT TERM GOAL #10   TITLE Pt will incr. FGA score to >/=20/24 to decr. falls risk. Target date: 03/10/16   Status Partially Met   PT SHORT TERM GOAL #11   TITLE Pt will dance for 5 minutes with S and no LOB  to dance with family. Target date: 03/10/16   Status Partially Met   PT SHORT TERM GOAL #12   TITLE Pt will jump x5 reps with S to play with children at home. Target date: 03/10/16   Status Achieved   PT SHORT TERM GOAL #13   TITLE Pt will transfer into bed with use of stool (height 35") with S to improve functional mobility at home. Target date: 03/10/16   Status Achieved           PT Long Term Goals - 04/18/16 1425    PT LONG TERM GOAL #1   Title Pt will ambulate 1000' over even/uneven terrain, with LRAD, at MOD I level to improve functional mobiliity. Target date: 02/07/16/17.   Baseline All unmet LTGs will be carried over to new 5 week POC: 05/16/16   Status Partially Met   PT LONG TERM GOAL #2   Title Pt will amb. 300' over even terrain without an AD, IND, to safely amb. in home. Target date: 04/11/16   Status Achieved   PT LONG TERM GOAL #3   Title Pt will improve gait speed to >/=2.673fsec, with LRAD, to amb. safely in the community. Target date: 11/12/14.   Status Achieved   PT  LONG TERM GOAL #4   Title Pt will verbalize plans to join a fitness center upon d/c from PT to continue to maintain strength and endurance gains made during PT. Target date: 04/11/16.   Status On-going   PT LONG TERM GOAL #5   Title Pt improve BERG score  to >/=55/56 to decr. falls risk. Target date: 12/11/15   Status Achieved   PT LONG TERM GOAL #6   Title Pt will ascend/descend curb with LRAD at MOD I level to improve functional mobility. Target date: 02/07/16   Status Achieved   PT LONG TERM GOAL #7   Title Pt will ascend/descend 12 steps no handrail at MOD I level, while carrying 20 lbs.l to improve functional mobility and carry child at home. Target date: 04/11/16   Status Partially Met   PT LONG TERM GOAL #8   Title Pt will ascend/descend ladder at MOD I level in order to transfer in/out of water. Target date: 04/11/16   Status Deferred   PT LONG TERM GOAL  #9   TITLE Pt will be able to transfer on/off boat at MOD I level, and no LOB, in order improve functional mobility and participate in family activities. Target date: 04/11/16   Status Deferred   PT LONG TERM GOAL  #10   TITLE Pt will be IIND in progressed HEP in order to improve balance, strength and flexibility. Target date: 02/07/16   Status Achieved   PT LONG TERM GOAL  #11   TITLE Pt will improve DGI score to >/=20/24 to decr. falls risk. Target date: 02/07/16   Status Achieved   PT LONG TERM GOAL  #12   TITLE Pt will improve FGA score to >/=25/30 to decr. falls risk. Target date: 04/11/16   Status On-going               Plan - 04/22/16 1304    Clinical Impression Statement Pt demonstrated progress, as she performed BWSTT at faster speed with less cues and no R AFO donned. Pt continues to experience incr. in gait deviations during overground amb. at faster speeds. Pt performed 4" step downs with BLE, with incr. R hip/knee ER, which could be due to muscle tightness/weakness.  Continue with POC.    Rehab Potential Good    Clinical Impairments Affecting Rehab Potential Seizures which may limit intensity of PT   PT Frequency 2x / week   PT Duration 8 weeks   PT Treatment/Interventions ADLs/Self Care Home Management;Neuromuscular re-education;Cognitive remediation;Biofeedback;DME Instruction;Gait training;Stair training;Canalith Repostioning;Patient/family education;Orthotic Fit/Training;Balance training;Therapeutic exercise;Manual techniques;Therapeutic activities;Vestibular   PT Next Visit Plan Review HEP and progress. (R SLS squats?)   PT Home Exercise Plan Stretching/strength/balance HEP   Consulted and Agree with Plan of Care Patient      Patient will benefit from skilled therapeutic intervention in order to improve the following deficits and impairments:  Abnormal gait, Decreased endurance, Impaired sensation, Decreased knowledge of precautions, Decreased activity tolerance, Decreased knowledge of use of DME, Decreased strength, Impaired UE functional use, Impaired tone, Decreased balance, Decreased mobility, Decreased cognition, Decreased range of motion, Decreased safety awareness, Decreased coordination, Impaired flexibility, Postural dysfunction  Visit Diagnosis: Other abnormalities of gait and mobility  Spastic hemiplegia affecting right dominant side [facility])     Problem List Patient Active Problem List   Diagnosis Date Noted  . Spastic hemiplegia and hemiparesis affecting dominant side ([place]) 09/13/2015  . Adhesive capsulitis of right shoulder 08/24/2015  . Symptomatic partial epilepsy with simple partial seizures ([place]) 08/21/2015  . Nontraumatic cortical hemorrhage of cerebral hemisphere ([place])   . Seizure ([place])   . Seizures ([place])   . Adjustment disorder with depressed mood   . Contracture of muscle ankle and foot 06/11/2015  . Hemiplga fol ntrm intcrbl hemor aff right dominant side 06/06/2015  . Left-sided intracerebral hemorrhage (  [place]) 06/04/2015  . Seizure disorder as sequela of  cerebrovascular accident ([place])   . Seizure disorder ([place]) 06/03/2015  . Sepsis ([place]) 06/03/2015  . UTI (urinary tract infection) 06/03/2015  . Hypotension 06/02/2015  . Right spastic hemiparesis ([place]) 05/28/2015  . 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 History of anxiety disorder 05/28/2015  . ICH (intracerebral hemorrhage) ([place])   . Seizure disorder, nonconvulsive, with status epilepticus ([place])   . Cerebral venous thrombosis of cortical vein   . Cytotoxic cerebral edema ([place])   . IVH (intraventricular hemorrhage) ([place])   . Term pregnancy 05/07/2015  . Spontaneous vaginal delivery 05/07/2015    Dia Jefferys L 04/22/2016, 1:06 PM  [facility] [address] [place], [place], [address] Phone: [phone]   Fax:  [phone]  Name: Ashley [name] MRN: [id number] Date of Birth: [birth year]    Geoffry Paradise, PT,DPT 04/22/2016 1:06 PM Phone: [phone] Fax: [phone]

## 2016-04-24 ENCOUNTER — Ambulatory Visit: Payer: BLUE CROSS/BLUE SHIELD | Admitting: Occupational Therapy

## 2016-04-24 ENCOUNTER — Encounter: Payer: Self-pay | Admitting: Occupational Therapy

## 2016-04-24 ENCOUNTER — Ambulatory Visit: Payer: BLUE CROSS/BLUE SHIELD

## 2016-04-24 ENCOUNTER — Ambulatory Visit (INDEPENDENT_AMBULATORY_CARE_PROVIDER_SITE_OTHER): Payer: BLUE CROSS/BLUE SHIELD | Admitting: Psychology

## 2016-04-24 ENCOUNTER — Encounter: Payer: Self-pay | Admitting: Psychology

## 2016-04-24 DIAGNOSIS — G8111 Spastic hemiplegia affecting right dominant side: Secondary | ICD-10-CM

## 2016-04-24 DIAGNOSIS — R293 Abnormal posture: Secondary | ICD-10-CM

## 2016-04-24 DIAGNOSIS — R2689 Other abnormalities of gait and mobility: Secondary | ICD-10-CM | POA: Diagnosis not present

## 2016-04-24 DIAGNOSIS — F4323 Adjustment disorder with mixed anxiety and depressed mood: Secondary | ICD-10-CM

## 2016-04-24 DIAGNOSIS — M25611 Stiffness of right shoulder, not elsewhere classified: Secondary | ICD-10-CM

## 2016-04-24 DIAGNOSIS — R41844 Frontal lobe and executive function deficit: Secondary | ICD-10-CM

## 2016-04-24 DIAGNOSIS — R208 Other disturbances of skin sensation: Secondary | ICD-10-CM

## 2016-04-24 NOTE — Therapy (Signed)
[facility] [address] [place], [place], [address] Phone: [phone]   Fax:  [phone]  Occupational Therapy Treatment  Patient Details  Name: Ashley [name] MRN: [id number] Date of Birth: [birth year] No Data Recorded  Encounter Date: 04/24/2016      OT End of Session - 04/24/16 1130    Visit Number 49   Number of Visits 56   Date for OT Re-Evaluation 05/06/16   Authorization Type BCBS   Authorization Time Period 30 visit COMBINED between PT/OT;  pt has utilized benefit and is now self pay.    OT Start Time 0801   OT Stop Time 0844   OT Time Calculation (min) 43 min   Activity Tolerance Patient tolerated treatment well      Past Medical History  Diagnosis Date  . Anxiety   . ICH (intracerebral hemorrhage) (HCC)   . Stroke (HCC)   . Seizures [facility])     Past Surgical History  Procedure Laterality Date  . Dilation and curettage of uterus      There were no vitals filed for this visit.      Subjective Assessment - 04/24/16 0807    Subjective  I drove to [employer] twice this week!   Pertinent History see epic snapshot; pt with [facility] 2 weeks after deliverying her 3rd child   Patient Stated Goals to be able to use my R arm normally   Currently in Pain? No/denies                      OT Treatments/Exercises (OP) - 04/24/16 0001    Neurological Re-education Exercises   Other Exercises 1 Neuro re ed for overhead bilateral reach at 175* wth rod and 2 pound weight with emphasis on over riding flexor synergy of IR and elbow flexion with overhead reach. Pt needs min facilitation.  Also addressed scapular depression with shoulder extension with resistance and moving body on arm with LUE loaded in ER, depression and addcution (using body weight as resistance).  Finalized updated HEP as pt to be placed on hold at this time due to possible botox injection to pec of RUE.  Pt has written copies of all upates.  Pt in agreement with plan.                 OT Education - 04/24/16 1126    Education provided Yes   Education Details upgraded and streamlined HEP for RUE   Person(s) Educated Patient   Methods Explanation;Demonstration;Verbal cues;Handout;Tactile cues   Comprehension Verbalized understanding;Returned demonstration          OT Short Term Goals - 04/24/16 1127    OT SHORT TERM GOAL  #11   TITLE Pt will be mod I with upgraded HEP - 01/08/2016   Status Achieved   OT SHORT TERM GOAL  #12   TITLE Pt will be able to pick up 8 pound object x 4 trials bilaterally to place on overhead shelf requiring at least 120* of shoulder flexion (for household mgmt tasks, grocery shopping ,etc)   Status Achieved   OT SHORT TERM GOAL  #13   TITLE Pt will be able to lift lightweight object unilaterally with RUE to 120* of shoulder flexion (baseline 115*)   Status Achieved  able to lift 4 pound object to this height with very minimal compensations   OT SHORT TERM GOAL  #14   TITLE Pt will be able to lift baby into and out  of tub while in kneeling from and to baby seat using both UE's - 03/11/2016   Status Achieved  Pt sat on gma's lap and pt able to lift baby from gma's lap into tub and then lift baby out of tub and hand to gma            OT Long Term Goals - 04/24/16 1127    OT LONG TERM GOAL #1   Title Pt will demonstrate ability to carry 18 pound object for at least 600 feet during functional activity 04/08/2016   Status Achieved   OT LONG TERM GOAL #2   Title Pt will be mod I with sweeping kitchen floor using both UE's   Status Achieved   OT LONG TERM GOAL #3   Title Pt will be mod I stepping over ledge of baby gate holding items in her hands   Status Achieved   OT LONG TERM GOAL #4   Title Pt will demonstrate abilty to turn wheel of care functional radius using both hands   Status Achieved   OT LONG TERM GOAL #5   Title Pt wil demonstrate ability to carry 10 pound object in RUE  up/down stairs using railing   Status Achieved   OT LONG TERM GOAL #6   Title Pt will identify components/skills/tasks of potential return to work - 04/08/2016   Status Achieved   OT LONG TERM GOAL #7   Title Pt will demonstrate ability to carry 5 pound object up and down stairs without railing - 05/06/2016   Status Achieved   OT LONG TERM GOAL #8   Title Pt will demonstrate ability 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  Pt to be placed on hold at this time as she is seeing MD next week for possible consideration of botox injection to R pec.  Pt in agreement with plan. Pt has made excellent progress overall and is now diriving and has resumed role of mother to 3 children.     Rehab Potential Good   Clinical Impairments Affecting Rehab Potential cognition, impaired sensation and anxiety   OT Frequency 2x / week   OT Duration 8 weeks   OT Treatment/Interventions Self-care/ADL training;Therapeutic exercise;Patient/family education;Balance training;Ultrasound;Neuromuscular education;Manual Therapy;Splinting;Therapeutic exercises;Energy conservation;Parrafin;DME and/or AE instruction;Therapeutic activities;Cognitive remediation/compensation;Gait Training;Fluidtherapy;Electrical Stimulation;Moist Heat;Contrast Bath;Passive range of motion;Visual/perceptual remediation/compensation   Plan on hold awaiting consideration of botox injection to RUE   Consulted and Agree with Plan of Care Patient      Patient will benefit from skilled therapeutic intervention in order to improve the following deficits and impairments:  Abnormal gait, Decreased coordination, Decreased range of motion, Difficulty walking,  Impaired flexibility, Decreased endurance, Decreased safety awareness, Increased edema, Impaired tone, Decreased knowledge of precautions, Decreased activity tolerance, Decreased balance, Decreased knowledge of use of DME, Pain, Impaired UE functional use, Impaired vision/preception, Decreased cognition, Decreased mobility, Decreased strength  Visit Diagnosis: Spastic hemiplegia affecting right dominant side (HCC)  Other abnormalities of gait and mobility  Abnormal posture  Other disturbances of skin sensation  Frontal lobe and executive function deficit  Stiffness  of right shoulder, not elsewhere classified    Problem List Patient Active Problem List   Diagnosis Date Noted  . Spastic hemiplegia and hemiparesis affecting dominant side ([place]) 09/13/2015  . Adhesive capsulitis of right shoulder 08/24/2015  . Symptomatic partial epilepsy with simple partial seizures ([place]) 08/21/2015  . Nontraumatic cortical hemorrhage of cerebral hemisphere ([place])   . Seizure ([place])   . Seizures ([place])   . Adjustment disorder with depressed mood   . Contracture of muscle ankle and foot 06/11/2015  . Hemiplga fol ntrm intcrbl hemor aff right dominant side 06/06/2015  . Left-sided intracerebral hemorrhage ([place]) 06/04/2015  . Seizure disorder as sequela of cerebrovascular accident ([place])   . Seizure disorder ([place]) 06/03/2015  . Sepsis ([place]) 06/03/2015  . UTI (urinary tract infection) 06/03/2015  . Hypotension 06/02/2015  . Right spastic hemiparesis ([place]) 05/28/2015  . 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 History of anxiety disorder 05/28/2015  . ICH (intracerebral hemorrhage) ([place])   . Seizure disorder, nonconvulsive, with status epilepticus ([place])   . Cerebral venous thrombosis of cortical vein   . Cytotoxic cerebral edema ([place])   . IVH (intraventricular hemorrhage) ([place])   . Term pregnancy 05/07/2015  . Spontaneous vaginal delivery 05/07/2015    Quay Burow, OTR/L 04/24/2016, 11:31 AM  [facility] [address] [place], [place], [address] Phone: [phone]   Fax:  [phone]  Name: Ashley [name] MRN: [id number] Date of Birth: 03-28-1976

## 2016-04-24 NOTE — Progress Notes (Signed)
[facility]  [address]   Telephone [phone] [address] Fax [phone] [place], [place] [address]   Psychology Progress Note   Name:  Brittain Anstey Date of Birth:  1976/03/20 MRN:  [id number]  Date:04/24/2016 (105m) psychotherapy She was very pleased to report that she passed her driving evaluation and has resumed driving without restrictions. She was just informed that she is to be discharged from OT and PT services shortly due to reaching a plateau. This development caused her distress due to losing the supportive relationships of her therapists and the concern that maybe she will not make much more progress, particularly with regards to her right leg function. She reported no issues in her relationships with her husband and children.  Diagnostic Impression Adjustment disorder with anxiety and depressed mood [F43.23]  She is coping well. Mood stable. I suggested we reduce frequency of treatment. She agreed. Next session on 05/20/16.  Jamey Ripa, Ph.D Licensed Psychologist

## 2016-04-24 NOTE — Patient Instructions (Signed)
Upgraded and streamlined HEP for RUE

## 2016-04-24 NOTE — Therapy (Signed)
[facility] [address] [place], [place], [address] Phone: [phone]   Fax:  [phone]  Physical Therapy Treatment  Patient Details  Name: Ashley [name] MRN: [id number] Date of Birth: August 05, 1976 No Data Recorded  Encounter Date: 04/24/2016      PT End of Session - 04/24/16 0927    Visit Number 79   Number of Visits 80   Date for PT Re-Evaluation 05/16/16   Authorization Type BCBS. Per Laverda Page, visits now 30 for PT and OT total (15 per OT and 15 per PT). Pt wishes to continue at self pay after visit limit met.    Authorization - Visit Number 45   Authorization - Number of Visits 16   PT Start Time 0848   PT Stop Time 0929   PT Time Calculation (min) 41 min   Equipment Utilized During Treatment --  S for safety.   Activity Tolerance Patient tolerated treatment well   Behavior During Therapy [facility] for tasks assessed/performed      Past Medical History  Diagnosis Date  . Anxiety   . ICH (intracerebral hemorrhage) ([place])   . Stroke ([place])   . Seizures [facility])     Past Surgical History  Procedure Laterality Date  . Dilation and curettage of uterus      There were no vitals filed for this visit.      Subjective Assessment - 04/24/16 0850    Subjective Pt denied falls or changes since last visit. Pt was emotional at the beginning of session, as today was the last day of OT for a while.   Pertinent History Seizures, low BP   Patient Stated Goals "Be as close to normal as much as possible and to go back to running" Get back to taking care of my kids, 61 weeks old and 47 and 25 years old   Currently in Pain? No/denies          Therex: Pt performed in supine, sitting, sidelying and standing positions. Cues for technique and progression. -Reviewed stretches (DF, piriformis, trunk and hamstring)but did not perform as pt is familiar with stretches. -Seated R DF with red band, 2 and 4 lb. Weights to decr. R  ankle inversion. 2 lb. Ankle wt. 3x10 demonstrated good technique. -R ankle eversion no wt. 3x10 reps.  -Wall squat with cues for placement at 80 degree angle: 1 rep of 10 sec.hold and 3 reps of 20 sec. Hold. -Squat with sidestep and yellow band, no UE support 4x7'. -R SLR 3x10 reps with 2 lbs. Ankle wt., as pt not using wt. At home. Cues to keep R knee fully extended. -R clamshells 3x10 reps with cues to keep hips forward. -R hamstring curls in prone with 2 lbs. Ankle weight 3x12 reps. Cues to improve eccentric control. -Bridges with and without B hip abduction: 3x10 reps/activity. Cues to keep R knee in neutral vs. R LE add.                        PT Education - 04/24/16 1245    Education provided Yes   Education Details Pt reviewed and progress HEP as tolerated.   Person(s) Educated Patient   Methods Explanation;Demonstration;Tactile cues;Verbal cues;Handout   Comprehension Returned demonstration;Verbalized understanding          PT Short Term Goals - 03/06/16 0903    PT SHORT TERM GOAL #1   Title Pt will be IND in HEP to improve strength,  balance, and endurance. Target date: 01/10/16.   Status Achieved   PT SHORT TERM GOAL #2   Title Pt will improve gait speed to >/=1.7f/sec to decrease falls risk. Target date: 07/27/15.   Status Achieved   PT SHORT TERM GOAL #3   Title Pt will ambulate 300' with LRAD, over even terrain, at MOD I level to improve functional mobility. Target date: 07/27/15.   Status Achieved   PT SHORT TERM GOAL #4   Title Pt will perform TUG in </=13.5 seconds with LRAD to decr. falls risk. Target date: 01/10/15.   Status Achieved   PT SHORT TERM GOAL #5   Title Perform BERG and write STG and LTG as appropriate. Target date: 07/27/15.   Status Achieved   PT SHORT TERM GOAL #6   Title Pt will improve BERG to >/=51/56 to decr. falls risk. Target date: 07/27/15.   Status Achieved   PT SHORT TERM GOAL #7   Title Pt will amb. 500' over  even/uneven terrain with LRAD at MOD I level to improve functional mobility. Target date: 01/10/16   Status Partially Met   PT SHORT TERM GOAL #8   Title Pt will ascend/descend curb with LRAD at MOD I level to improve functional mobility. Target date: 01/10/16   Status Partially Met   PT SHORT TERM GOAL #9   TITLE Pt will ascend/descend 12 steps with one handrail at MOD I level to improve functional mobility. Target date: 01/10/16   Status Achieved   PT SHORT TERM GOAL #10   TITLE Pt will incr. FGA score to >/=20/24 to decr. falls risk. Target date: 03/10/16   Status Partially Met   PT SHORT TERM GOAL #11   TITLE Pt will dance for 5 minutes with S and no LOB  to dance with family. Target date: 03/10/16   Status Partially Met   PT SHORT TERM GOAL #12   TITLE Pt will jump x5 reps with S to play with children at home. Target date: 03/10/16   Status Achieved   PT SHORT TERM GOAL #13   TITLE Pt will transfer into bed with use of stool (height 35") with S to improve functional mobility at home. Target date: 03/10/16   Status Achieved           PT Long Term Goals - 04/18/16 1425    PT LONG TERM GOAL #1   Title Pt will ambulate 1000' over even/uneven terrain, with LRAD, at MOD I level to improve functional mobiliity. Target date: 02/07/16/17.   Baseline All unmet LTGs will be carried over to new 5 week POC: 05/16/16   Status Partially Met   PT LONG TERM GOAL #2   Title Pt will amb. 300' over even terrain without an AD, IND, to safely amb. in home. Target date: 04/11/16   Status Achieved   PT LONG TERM GOAL #3   Title Pt will improve gait speed to >/=2.656fsec, with LRAD, to amb. safely in the community. Target date: 11/12/14.   Status Achieved   PT LONG TERM GOAL #4   Title Pt will verbalize plans to join a fitness center upon d/c from PT to continue to maintain strength and endurance gains made during PT. Target date: 04/11/16.   Status On-going   PT LONG TERM GOAL #5   Title Pt improve BERG  score to >/=55/56 to decr. falls risk. Target date: 12/11/15   Status Achieved   PT LONG TERM GOAL #6   Title Pt will  ascend/descend curb with LRAD at MOD I level to improve functional mobility. Target date: 02/07/16   Status Achieved   PT LONG TERM GOAL #7   Title Pt will ascend/descend 12 steps no handrail at MOD I level, while carrying 20 lbs.l to improve functional mobility and carry child at home. Target date: 04/11/16   Status Partially Met   PT LONG TERM GOAL #8   Title Pt will ascend/descend ladder at MOD I level in order to transfer in/out of water. Target date: 04/11/16   Status Deferred   PT LONG TERM GOAL  #9   TITLE Pt will be able to transfer on/off boat at MOD I level, and no LOB, in order improve functional mobility and participate in family activities. Target date: 04/11/16   Status Deferred   PT LONG TERM GOAL  #10   TITLE Pt will be IIND in progressed HEP in order to improve balance, strength and flexibility. Target date: 02/07/16   Status Achieved   PT LONG TERM GOAL  #11   TITLE Pt will improve DGI score to >/=20/24 to decr. falls risk. Target date: 02/07/16   Status Achieved   PT LONG TERM GOAL  #12   TITLE Pt will improve FGA score to >/=25/30 to decr. falls risk. Target date: 04/11/16   Status On-going               Plan - 04/24/16 1245    Clinical Impression Statement Pt demonstrated progress, as she was able to tolerate progression of strengthening HEP. Pt continues to experience decr. R ankle eversion strength, however, PT felt trace eversion contraction during pt perform AROM eversion, will continue to monitor. Continue with POC.   Rehab Potential Good   Clinical Impairments Affecting Rehab Potential Seizures which may limit intensity of PT   PT Frequency 2x / week   PT Duration 8 weeks   PT Treatment/Interventions ADLs/Self Care Home Management;Neuromuscular re-education;Cognitive remediation;Biofeedback;DME Instruction;Gait training;Stair training;Canalith  Repostioning;Patient/family education;Orthotic Fit/Training;Balance training;Therapeutic exercise;Manual techniques;Therapeutic activities;Vestibular   PT Next Visit Plan Add R SLS squats? to HEP and lunges? Continue gait training.   PT Home Exercise Plan Stretching/strength/balance HEP   Consulted and Agree with Plan of Care Patient      Patient will benefit from skilled therapeutic intervention in order to improve the following deficits and impairments:  Abnormal gait, Decreased endurance, Impaired sensation, Decreased knowledge of precautions, Decreased activity tolerance, Decreased knowledge of use of DME, Decreased strength, Impaired UE functional use, Impaired tone, Decreased balance, Decreased mobility, Decreased cognition, Decreased range of motion, Decreased safety awareness, Decreased coordination, Impaired flexibility, Postural dysfunction  Visit Diagnosis: Spastic hemiplegia affecting right dominant side (HCC)  Other abnormalities of gait and mobility     Problem List Patient Active Problem List   Diagnosis Date Noted  . Spastic hemiplegia and hemiparesis affecting dominant side ([place]) 09/13/2015  . Adhesive capsulitis of right shoulder 08/24/2015  . Symptomatic partial epilepsy with simple partial seizures ([place]) 08/21/2015  . Nontraumatic cortical hemorrhage of cerebral hemisphere ([place])   . Seizure ([place])   . Seizures ([place])   . Adjustment disorder with depressed mood   . Contracture of muscle ankle and foot 06/11/2015  . Hemiplga fol ntrm intcrbl hemor aff right dominant side 06/06/2015  . Left-sided intracerebral hemorrhage ([place]) 06/04/2015  . Seizure disorder as sequela of cerebrovascular accident ([place])   . Seizure disorder ([place]) 06/03/2015  . Sepsis ([place]) 06/03/2015  . UTI (urinary tract infection) 06/03/2015  . Hypotension 06/02/2015  .  Right spastic hemiparesis ([place]) 05/28/2015  . 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 History of  anxiety disorder 05/28/2015  . ICH (intracerebral hemorrhage) ([place])   . Seizure disorder, nonconvulsive, with status epilepticus ([place])   . Cerebral venous thrombosis of cortical vein   . Cytotoxic cerebral edema ([place])   . IVH (intraventricular hemorrhage) ([place])   . Term pregnancy 05/07/2015  . Spontaneous vaginal delivery 05/07/2015    Sheamus Hasting L 04/24/2016, 12:55 PM  [facility] [address] [place], [place], [address] Phone: [phone]   Fax:  [phone]  Name: Enzley Kitchens MRN: [id number] Date of Birth: [birth year]   Geoffry Paradise, PT,DPT 04/24/2016 12:55 PM Phone: [phone] Fax: [phone]

## 2016-04-28 ENCOUNTER — Encounter: Payer: BLUE CROSS/BLUE SHIELD | Attending: Physical Medicine & Rehabilitation

## 2016-04-28 ENCOUNTER — Ambulatory Visit (HOSPITAL_BASED_OUTPATIENT_CLINIC_OR_DEPARTMENT_OTHER): Payer: BLUE CROSS/BLUE SHIELD | Admitting: Physical Medicine & Rehabilitation

## 2016-04-28 ENCOUNTER — Encounter: Payer: Self-pay | Admitting: Physical Medicine & Rehabilitation

## 2016-04-28 VITALS — BP 95/64 | HR 75 | Resp 16

## 2016-04-28 DIAGNOSIS — F419 Anxiety disorder, unspecified: Secondary | ICD-10-CM | POA: Diagnosis not present

## 2016-04-28 DIAGNOSIS — G8110 Spastic hemiplegia affecting unspecified side: Secondary | ICD-10-CM

## 2016-04-28 DIAGNOSIS — Z79899 Other long term (current) drug therapy: Secondary | ICD-10-CM | POA: Insufficient documentation

## 2016-04-28 DIAGNOSIS — I69151 Hemiplegia and hemiparesis following nontraumatic intracerebral hemorrhage affecting right dominant side: Secondary | ICD-10-CM | POA: Insufficient documentation

## 2016-04-28 DIAGNOSIS — G8111 Spastic hemiplegia affecting right dominant side: Secondary | ICD-10-CM | POA: Diagnosis not present

## 2016-04-28 DIAGNOSIS — I69120 Aphasia following nontraumatic intracerebral hemorrhage: Secondary | ICD-10-CM | POA: Insufficient documentation

## 2016-04-28 NOTE — Progress Notes (Signed)
Subjective:    Patient ID: Ashley [name], female    DOB: Jan 19, 1976, 40 y.o.   MRN: [id number]  HPI F/u botox on 03/17/16 Gastrosoleus 50 units medial and 50 units lateral Soleus 25 units 3 Posterior tibialis 50 units 3 Flexor digitorum longus 25 units 3  Pt not convinced it helped her walk but her husband states she is not using her AFO anylonger  OT asking if R pec can be injected with Botox.  Pain Inventory Average Pain 1 Pain Right Now 1 My pain is intermittent and sharp  In the last 24 hours, has pain interfered with the following? General activity 1 Relation with others 0 Enjoyment of life 0 What TIME of day is your pain at its worst? morning Sleep (in general) Fair  Pain is worse with: some activites Pain improves with: therapy/exercise Relief from Meds: 0  Mobility walk without assistance how many minutes can you walk? 60+ ability to climb steps?  yes do you drive?  no transfers alone Do you have any goals in this area?  yes  Function not employed: date last employed . Do you have any goals in this area?  yes  Neuro/Psych trouble walking dizziness depression  Prior Studies Any changes since last visit?  no  Physicians involved in your care Any changes since last visit?  no   Family History  Problem Relation Age of Onset  . Cancer Mother   . Cancer Father     pancreatic   Social History   Social History  . Marital Status: Married    Spouse Name: N/A  . Number of Children: N/A  . Years of Education: N/A   Social History Main Topics  . Smoking status: Never Smoker   . Smokeless tobacco: Never Used  . Alcohol Use: 0.6 oz/week    1 Glasses of wine per week     Comment: casual   . Drug Use: No  . Sexual Activity: Not Asked   Other Topics Concern  . None   Social History Narrative   Past Surgical History  Procedure Laterality Date  . Dilation and curettage of uterus     Past Medical History  Diagnosis Date  . Anxiety   .  ICH (intracerebral hemorrhage) ([place])   . Stroke ([place])   . Seizures (HCC)    BP 95/64 mmHg  Pulse 75  Resp 16  SpO2 96%  Opioid Risk Score:   Fall Risk Score:  `1  Depression screen PHQ 2/9  Depression screen PHQ 2/9 07/16/2015  Decreased Interest 1  Down, Depressed, Hopeless 1  PHQ - 2 Score 2  Altered sleeping 0  Tired, decreased energy 1  Change in appetite 1  Feeling bad or failure about yourself  1  Trouble concentrating 1  Moving slowly or fidgety/restless 1  Suicidal thoughts 1  PHQ-9 Score 8  Difficult doing work/chores Somewhat difficult     Review of Systems  Constitutional: Positive for appetite change.  Eyes: Negative.   Cardiovascular: Negative.   Gastrointestinal: Negative.   Endocrine: Negative.   Musculoskeletal: Positive for gait problem.  Skin: Negative.   Neurological: Positive for dizziness.  Psychiatric/Behavioral: Positive for dysphoric mood.  All other systems reviewed and are negative.      Objective:   Physical Exam Right pec ashworth 1 THere is mild endrange contrature with fwd flexion and ext rotation  Right foot and ankle, with 10degrees ankle flexion contracture Ambulates with knee hyper ext and mild foot inversion but  no toe drag No clonus at ankle or at toes  RUE strength 4+/5 R Quad 4/5, HF 4/5  Ankle DF inhibited by reduce ROM       Assessment & Plan:  1.  Right spastic hemi, while pt unsure about effect of botox, she is now able to amb without AFO however pt has genu recurvatum and we discussed possiblity of developing post knee capsilar pain She does not wish to have repeat botox injection, will reassess in 8 weeks at shech point it will be fully worn off.  2.  R shoulder contracture , do not think botox to pec would be helpful

## 2016-04-28 NOTE — Patient Instructions (Signed)
Please start wearing AFO. If you develop pain in the back of your knee. Also, if you start tripping or twisting your ankle on the right side.  Also, your therapist should know the name of a personal trainer that has experience with stroke Patients

## 2016-04-29 ENCOUNTER — Ambulatory Visit: Payer: BLUE CROSS/BLUE SHIELD

## 2016-04-29 DIAGNOSIS — G8111 Spastic hemiplegia affecting right dominant side: Secondary | ICD-10-CM

## 2016-04-29 DIAGNOSIS — R2689 Other abnormalities of gait and mobility: Secondary | ICD-10-CM

## 2016-04-29 NOTE — Therapy (Signed)
[place] [address] [place], [place], [address] Phone: [phone]   Fax:  [phone]  Physical Therapy Treatment  Patient Details  Name: Ashley [name] MRN: [id number] Date of Birth: 23-Aug-1976 No Data Recorded  Encounter Date: 04/29/2016      PT End of Session - 04/29/16 1702    Visit Number 1   Number of Visits 80   Date for PT Re-Evaluation 05/16/16   Authorization Type BCBS. Per Laverda Page, visits now 30 for PT and OT total (15 per OT and 15 per PT). Pt wishes to continue at self pay after visit limit met.    Authorization - Visit Number 26   Authorization - Number of Visits 16   PT Start Time 5188   PT Stop Time 1356   PT Time Calculation (min) 41 min   Equipment Utilized During Treatment --  min guard to S   Activity Tolerance Patient tolerated treatment well   Behavior During Therapy [facility] for tasks assessed/performed      Past Medical History  Diagnosis Date  . Anxiety   . ICH (intracerebral hemorrhage) ([place])   . Stroke ([place])   . Seizures [facility])     Past Surgical History  Procedure Laterality Date  . Dilation and curettage of uterus      There were no vitals filed for this visit.      Subjective Assessment - 04/29/16 1316    Subjective Pt denied falls or changes since last visit. Pt reported she spoke with Dr. Letta Pate re: R UE botox and he did not feel she needed it and was open to pt not receiving botox again for another 8 weeks to determine RLE strength without botox.  Pt agreeable to participating in PTA student project, she signed student's consent.    Patient is accompained by: Family member  dtr-Brianna   Pertinent History Seizures, low BP   Patient Stated Goals "Be as close to normal as much as possible and to go back to running" Get back to taking care of my kids, 81 weeks old and 2 and 31 years old   Currently in Pain? No/denies                         [facility]  Adult PT Treatment/Exercise - 04/29/16 1333    Ambulation/Gait   Ambulation/Gait Yes   Ambulation/Gait Assistance 5: Supervision;4: Min guard;6: Modified independent (Device/Increase time)   Ambulation/Gait Assistance Details Cues to improve R LE stance time, decr. R UE synergy pattern, and to improve R heel strike, pt progressed to MOD I level. Pt required min guard durimg backwards amb. on compliant surfaces. Pt amb. backwards with and without resistance provided by PT. 4x7' and 6x15'.   Ambulation Distance (Feet) 230 Feet  and 4x7' and 6x15' on mats   Assistive device None   Gait Pattern Decreased stance time - right;Decreased weight shift to right;Right foot flat;Decreased hip/knee flexion - right;Step-through pattern;Decreased step length - left;Trunk rotated posteriorly on right   Ambulation Surface Level;Indoor;Other (comment)  compliant surfaces   Exercises   Exercises Knee/Hip   Knee/Hip Exercises: Standing   Forward Lunges Both;2 sets;10 reps   Forward Lunges Limitations Cues for technique and to keep R knee from abd.   Other Standing Knee Exercises All exercises performed in // bars with 1-2 UE support and S for safety: R single leg squat 2 sets of 10 reps.  Cues for technique. Please see  pt HEP instructions for details.   Knee/Hip Exercises: Seated   Long Arc Quad Strengthening;1 set;Both;10 reps   [employer] Limitations Performed while seated on physioball to improve quad and core strength, cues for technique and min guard to S for safety, no UE support.                PT Education - 04/29/16 1702    Education provided Yes   Education Details Progressed strengthening HEP   Person(s) Educated Patient   Methods Explanation;Demonstration;Verbal cues;Tactile cues;Handout   Comprehension Returned demonstration;Verbalized understanding          PT Short Term Goals - 03/06/16 0903    PT SHORT TERM GOAL #1   Title Pt will be IND in HEP to improve strength, balance,  and endurance. Target date: 01/10/16.   Status Achieved   PT SHORT TERM GOAL #2   Title Pt will improve gait speed to >/=1.46f/sec to decrease falls risk. Target date: 07/27/15.   Status Achieved   PT SHORT TERM GOAL #3   Title Pt will ambulate 300' with LRAD, over even terrain, at MOD I level to improve functional mobility. Target date: 07/27/15.   Status Achieved   PT SHORT TERM GOAL #4   Title Pt will perform TUG in </=13.5 seconds with LRAD to decr. falls risk. Target date: 01/10/15.   Status Achieved   PT SHORT TERM GOAL #5   Title Perform BERG and write STG and LTG as appropriate. Target date: 07/27/15.   Status Achieved   PT SHORT TERM GOAL #6   Title Pt will improve BERG to >/=51/56 to decr. falls risk. Target date: 07/27/15.   Status Achieved   PT SHORT TERM GOAL #7   Title Pt will amb. 500' over even/uneven terrain with LRAD at MOD I level to improve functional mobility. Target date: 01/10/16   Status Partially Met   PT SHORT TERM GOAL #8   Title Pt will ascend/descend curb with LRAD at MOD I level to improve functional mobility. Target date: 01/10/16   Status Partially Met   PT SHORT TERM GOAL #9   TITLE Pt will ascend/descend 12 steps with one handrail at MOD I level to improve functional mobility. Target date: 01/10/16   Status Achieved   PT SHORT TERM GOAL #10   TITLE Pt will incr. FGA score to >/=20/24 to decr. falls risk. Target date: 03/10/16   Status Partially Met   PT SHORT TERM GOAL #11   TITLE Pt will dance for 5 minutes with S and no LOB  to dance with family. Target date: 03/10/16   Status Partially Met   PT SHORT TERM GOAL #12   TITLE Pt will jump x5 reps with S to play with children at home. Target date: 03/10/16   Status Achieved   PT SHORT TERM GOAL #13   TITLE Pt will transfer into bed with use of stool (height 35") with S to improve functional mobility at home. Target date: 03/10/16   Status Achieved           PT Long Term Goals - 04/18/16 1425    PT  LONG TERM GOAL #1   Title Pt will ambulate 1000' over even/uneven terrain, with LRAD, at MOD I level to improve functional mobiliity. Target date: 02/07/16/17.   Baseline All unmet LTGs will be carried over to new 5 week POC: 05/16/16   Status Partially Met   PT LONG TERM GOAL #2   Title Pt  will amb. 300' over even terrain without an AD, IND, to safely amb. in home. Target date: 04/11/16   Status Achieved   PT LONG TERM GOAL #3   Title Pt will improve gait speed to >/=2.45f/sec, with LRAD, to amb. safely in the community. Target date: 11/12/14.   Status Achieved   PT LONG TERM GOAL #4   Title Pt will verbalize plans to join a fitness center upon d/c from PT to continue to maintain strength and endurance gains made during PT. Target date: 04/11/16.   Status On-going   PT LONG TERM GOAL #5   Title Pt improve BERG score to >/=55/56 to decr. falls risk. Target date: 12/11/15   Status Achieved   PT LONG TERM GOAL #6   Title Pt will ascend/descend curb with LRAD at MOD I level to improve functional mobility. Target date: 02/07/16   Status Achieved   PT LONG TERM GOAL #7   Title Pt will ascend/descend 12 steps no handrail at MOD I level, while carrying 20 lbs.l to improve functional mobility and carry child at home. Target date: 04/11/16   Status Partially Met   PT LONG TERM GOAL #8   Title Pt will ascend/descend ladder at MOD I level in order to transfer in/out of water. Target date: 04/11/16   Status Deferred   PT LONG TERM GOAL  #9   TITLE Pt will be able to transfer on/off boat at MOD I level, and no LOB, in order improve functional mobility and participate in family activities. Target date: 04/11/16   Status Deferred   PT LONG TERM GOAL  #10   TITLE Pt will be IIND in progressed HEP in order to improve balance, strength and flexibility. Target date: 02/07/16   Status Achieved   PT LONG TERM GOAL  #11   TITLE Pt will improve DGI score to >/=20/24 to decr. falls risk. Target date: 02/07/16   Status  Achieved   PT LONG TERM GOAL  #12   TITLE Pt will improve FGA score to >/=25/30 to decr. falls risk. Target date: 04/11/16   Status On-going               Plan - 04/29/16 1703    Clinical Impression Statement Pt demonstrated progress, as she was able to tolerate continued progression of strengthening HEP. Pt continues to require cues to decr. R UE synergy pattern during gait without R AFO donned. Continue with POC.    Rehab Potential Good   Clinical Impairments Affecting Rehab Potential Seizures which may limit intensity of PT   PT Frequency 2x / week   PT Duration 8 weeks   PT Treatment/Interventions ADLs/Self Care Home Management;Neuromuscular re-education;Cognitive remediation;Biofeedback;DME Instruction;Gait training;Stair training;Canalith Repostioning;Patient/family education;Orthotic Fit/Training;Balance training;Therapeutic exercise;Manual techniques;Therapeutic activities;Vestibular   PT Next Visit Plan BWSTT and high level balance (bosu)   PT Home Exercise Plan Stretching/strength/balance HEP   Consulted and Agree with Plan of Care Patient      Patient will benefit from skilled therapeutic intervention in order to improve the following deficits and impairments:  Abnormal gait, Decreased endurance, Impaired sensation, Decreased knowledge of precautions, Decreased activity tolerance, Decreased knowledge of use of DME, Decreased strength, Impaired UE functional use, Impaired tone, Decreased balance, Decreased mobility, Decreased cognition, Decreased range of motion, Decreased safety awareness, Decreased coordination, Impaired flexibility, Postural dysfunction  Visit Diagnosis: Other abnormalities of gait and mobility  Spastic hemiplegia affecting right dominant side ([facility]     Problem List Patient Active Problem List  Diagnosis Date Noted  . Spastic hemiplegia and hemiparesis affecting dominant side ([place]) 09/13/2015  . Adhesive capsulitis of right shoulder 08/24/2015   . Symptomatic partial epilepsy with simple partial seizures ([place]) 08/21/2015  . Nontraumatic cortical hemorrhage of cerebral hemisphere ([place])   . Seizure ([place])   . Seizures ([place])   . Adjustment disorder with depressed mood   . Contracture of muscle ankle and foot 06/11/2015  . Hemiplga fol ntrm intcrbl hemor aff right dominant side 06/06/2015  . Left-sided intracerebral hemorrhage ([place]) 06/04/2015  . Seizure disorder as sequela of cerebrovascular accident ([place])   . Seizure disorder ([place]) 06/03/2015  . Sepsis ([name]) 06/03/2015  . UTI (urinary tract infection) 06/03/2015  . Hypotension 06/02/2015  . Right spastic hemiparesis ([place]) 05/28/2015  . 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 History of anxiety disorder 05/28/2015  . ICH (intracerebral hemorrhage) ([place])   . Seizure disorder, nonconvulsive, with status epilepticus ([place])   . Cerebral venous thrombosis of cortical vein   . Cytotoxic cerebral edema ([place])   . IVH (intraventricular hemorrhage) ([place])   . Term pregnancy 05/07/2015  . Spontaneous vaginal delivery 05/07/2015    , L 04/29/2016, 5:05 PM  [place] [address] [place], [place], [address] Phone: [phone]   Fax:  [phone]  Name: Kash Mothershead MRN: [id number] Date of Birth: [birth year]    Geoffry Paradise, PT,DPT 04/29/2016 5:05 PM Phone: [phone] Fax: [phone]

## 2016-04-29 NOTE — Patient Instructions (Signed)
Mini Squat: Single Leg    Stand on right foot. Reach forward for balance (or stand up straight while holding counter for support) and do a mini squat. Keep knees in line with second toe. Knees do not go past toes. Repeat _10__ times. Repeat with other leg for set. Rest _30-60__ seconds after set. Do _2__ sets per session. Perform 3 days per week.  http://plyo.exer.us/72   Copyright  VHI. All rights reserved.   Knee Extension: Forward Lunge (Eccentric) (Weight Bar)    Stand at counter with hands on counter for support, back straight, abdomen tight. Don't use resistance band. Slowly lower into forward lunge for 3-5 seconds. Push up quickly. Return to start position. _10__ reps per set, _2__ sets per day, _3__ days per week. Perform with each leg in front.   http://ecce.exer.us/149   Copyright  VHI. All rights reserved.

## 2016-05-01 ENCOUNTER — Ambulatory Visit: Payer: BLUE CROSS/BLUE SHIELD

## 2016-05-01 DIAGNOSIS — R2689 Other abnormalities of gait and mobility: Secondary | ICD-10-CM | POA: Diagnosis not present

## 2016-05-01 DIAGNOSIS — G8111 Spastic hemiplegia affecting right dominant side: Secondary | ICD-10-CM

## 2016-05-01 NOTE — Therapy (Signed)
[facility] [address] [place], [place], [address] Phone: [phone]   Fax:  [phone]  Physical Therapy Treatment  Patient Details  Name: Ashley [name] MRN: [id number] Date of Birth: 11-02-1975 No Data Recorded  Encounter Date: 05/01/2016      PT End of Session - 05/01/16 1502    Visit Number 76   Number of Visits 80   Date for PT Re-Evaluation 05/16/16   Authorization Type BCBS. Per Conan Bowens, visits now 30 for PT and OT total (15 per OT and 15 per PT). Pt wishes to continue at self pay after visit limit met.    Authorization - Visit Number 47   Authorization - Number of Visits 16   PT Start Time 1319   PT Stop Time 1359   PT Time Calculation (min) 40 min   Equipment Utilized During Treatment Gait belt  and BWSTT harness   Activity Tolerance Patient tolerated treatment well   Behavior During Therapy WFL for tasks assessed/performed      Past Medical History  Diagnosis Date  . Anxiety   . ICH (intracerebral hemorrhage) ([facility])   . Stroke ([facility])   . Seizures [facility])     Past Surgical History  Procedure Laterality Date  . Dilation and curettage of uterus      There were no vitals filed for this visit.      Subjective Assessment - 05/01/16 1321    Subjective Pt denied falls or changes since last visit.    Pertinent History Seizures, low BP   Patient Stated Goals "Be as close to normal as much as possible and to go back to running" Get back to taking care of my kids, 3 weeks old and 41 and 39 years old   Currently in Pain? No/denies                         [facility] Adult PT Treatment/Exercise - 05/01/16 1322    Ambulation/Gait   Ambulation/Gait Yes   Ambulation/Gait Assistance 5: Supervision   Ambulation/Gait Assistance Details Cues to improve R LE stance time and decr. R UE synergy.    Ambulation Distance (Feet) 550 Feet  and BWSTT (0.2 miles)   Assistive device None;Body weight  support system   Gait Pattern Decreased stance time - right;Decreased weight shift to right;Right foot flat;Decreased hip/knee flexion - right;Step-through pattern;Decreased step length - left;Trunk rotated posteriorly on right   Ambulation Surface Level;Indoor             Balance Exercises - 05/01/16 1500    Balance Exercises: Standing   Other Standing Exercises Performed in // bars: pt performed step to bosu ball txfs with B LEs (2x5reps/LE) with 1-2 UE support with min A to maintain balance. Feet apart on bosu ball with eyes open/closed, 3x30sec. holds/activity, cues for technique. All activities performed to mimic boating activities/txfs.              PT Short Term Goals - 03/06/16 0903    PT SHORT TERM GOAL #1   Title Pt will be IND in HEP to improve strength, balance, and endurance. Target date: 01/10/16.   Status Achieved   PT SHORT TERM GOAL #2   Title Pt will improve gait speed to >/=1.41ft/sec to decrease falls risk. Target date: 07/27/15.   Status Achieved   PT SHORT TERM GOAL #3   Title Pt will ambulate 300' with LRAD, over even terrain, at MOD I  level to improve functional mobility. Target date: 07/27/15.   Status Achieved   PT SHORT TERM GOAL #4   Title Pt will perform TUG in </=13.5 seconds with LRAD to decr. falls risk. Target date: 01/10/15.   Status Achieved   PT SHORT TERM GOAL #5   Title Perform BERG and write STG and LTG as appropriate. Target date: 07/27/15.   Status Achieved   PT SHORT TERM GOAL #6   Title Pt will improve BERG to >/=51/56 to decr. falls risk. Target date: 07/27/15.   Status Achieved   PT SHORT TERM GOAL #7   Title Pt will amb. 500' over even/uneven terrain with LRAD at MOD I level to improve functional mobility. Target date: 01/10/16   Status Partially Met   PT SHORT TERM GOAL #8   Title Pt will ascend/descend curb with LRAD at MOD I level to improve functional mobility. Target date: 01/10/16   Status Partially Met   PT SHORT TERM GOAL  #9   TITLE Pt will ascend/descend 12 steps with one handrail at MOD I level to improve functional mobility. Target date: 01/10/16   Status Achieved   PT SHORT TERM GOAL #10   TITLE Pt will incr. FGA score to >/=20/24 to decr. falls risk. Target date: 03/10/16   Status Partially Met   PT SHORT TERM GOAL #11   TITLE Pt will dance for 5 minutes with S and no LOB  to dance with family. Target date: 03/10/16   Status Partially Met   PT SHORT TERM GOAL #12   TITLE Pt will jump x5 reps with S to play with children at home. Target date: 03/10/16   Status Achieved   PT SHORT TERM GOAL #13   TITLE Pt will transfer into bed with use of stool (height 35") with S to improve functional mobility at home. Target date: 03/10/16   Status Achieved           PT Long Term Goals - 04/18/16 1425    PT LONG TERM GOAL #1   Title Pt will ambulate 1000' over even/uneven terrain, with LRAD, at MOD I level to improve functional mobiliity. Target date: 02/07/16/17.   Baseline All unmet LTGs will be carried over to new 5 week POC: 05/16/16   Status Partially Met   PT LONG TERM GOAL #2   Title Pt will amb. 300' over even terrain without an AD, IND, to safely amb. in home. Target date: 04/11/16   Status Achieved   PT LONG TERM GOAL #3   Title Pt will improve gait speed to >/=2.43ft/sec, with LRAD, to amb. safely in the community. Target date: 11/12/14.   Status Achieved   PT LONG TERM GOAL #4   Title Pt will verbalize plans to join a fitness center upon d/c from PT to continue to maintain strength and endurance gains made during PT. Target date: 04/11/16.   Status On-going   PT LONG TERM GOAL #5   Title Pt improve BERG score to >/=55/56 to decr. falls risk. Target date: 12/11/15   Status Achieved   PT LONG TERM GOAL #6   Title Pt will ascend/descend curb with LRAD at MOD I level to improve functional mobility. Target date: 02/07/16   Status Achieved   PT LONG TERM GOAL #7   Title Pt will ascend/descend 12 steps no  handrail at MOD I level, while carrying 20 lbs.l to improve functional mobility and carry child at home. Target date: 04/11/16   Status Partially  Met   PT LONG TERM GOAL #8   Title Pt will ascend/descend ladder at MOD I level in order to transfer in/out of water. Target date: 04/11/16   Status Deferred   PT LONG TERM GOAL  #9   TITLE Pt will be able to transfer on/off boat at MOD I level, and no LOB, in order improve functional mobility and participate in family activities. Target date: 04/11/16   Status Deferred   PT LONG TERM GOAL  #10   TITLE Pt will be IIND in progressed HEP in order to improve balance, strength and flexibility. Target date: 02/07/16   Status Achieved   PT LONG TERM GOAL  #11   TITLE Pt will improve DGI score to >/=20/24 to decr. falls risk. Target date: 02/07/16   Status Achieved   PT LONG TERM GOAL  #12   TITLE Pt will improve FGA score to >/=25/30 to decr. falls risk. Target date: 04/11/16   Status On-going               Plan - 05/01/16 1502    Clinical Impression Statement Pt demonstrated progress, as she did ont require tactile cues during BWSTT and progressed to reduce R UE synergy pattern during overground training. Pt also required less assist during balance activities on bosu ball, demonstrating improved vestibular input. Conitnue with POC.   Rehab Potential Good   Clinical Impairments Affecting Rehab Potential Seizures which may limit intensity of PT   PT Frequency 2x / week   PT Duration 8 weeks   PT Treatment/Interventions ADLs/Self Care Home Management;Neuromuscular re-education;Cognitive remediation;Biofeedback;DME Instruction;Gait training;Stair training;Canalith Repostioning;Patient/family education;Orthotic Fit/Training;Balance training;Therapeutic exercise;Manual techniques;Therapeutic activities;Vestibular   PT Next Visit Plan High level balance and gait over compliant surfaces, dancing?   PT Home Exercise Plan Stretching/strength/balance HEP    Consulted and Agree with Plan of Care Patient      Patient will benefit from skilled therapeutic intervention in order to improve the following deficits and impairments:  Abnormal gait, Decreased endurance, Impaired sensation, Decreased knowledge of precautions, Decreased activity tolerance, Decreased knowledge of use of DME, Decreased strength, Impaired UE functional use, Impaired tone, Decreased balance, Decreased mobility, Decreased cognition, Decreased range of motion, Decreased safety awareness, Decreased coordination, Impaired flexibility, Postural dysfunction  Visit Diagnosis: Other abnormalities of gait and mobility  Spastic hemiplegia affecting right dominant side [facility])     Problem List Patient Active Problem List   Diagnosis Date Noted  . Spastic hemiplegia and hemiparesis affecting dominant side ([place]) 09/13/2015  . Adhesive capsulitis of right shoulder 08/24/2015  . Symptomatic partial epilepsy with simple partial seizures ([place]) 08/21/2015  . Nontraumatic cortical hemorrhage of cerebral hemisphere ([place])   . Seizure ([place])   . Seizures ([place])   . Adjustment disorder with depressed mood   . Contracture of muscle ankle and foot 06/11/2015  . Hemiplga fol ntrm intcrbl hemor aff right dominant side 06/06/2015  . Left-sided intracerebral hemorrhage ([place]) 06/04/2015  . Seizure disorder as sequela of cerebrovascular accident ([place])   . Seizure disorder ([place]) 06/03/2015  . Sepsis ([place]) 06/03/2015  . UTI (urinary tract infection) 06/03/2015  . Hypotension 06/02/2015  . Right spastic hemiparesis ([place]) 05/28/2015  . 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 History of anxiety disorder 05/28/2015  . ICH (intracerebral hemorrhage) ([place])   . Seizure disorder, nonconvulsive, with status epilepticus ([place])   . Cerebral venous thrombosis of cortical vein   . Cytotoxic cerebral edema ([place])   . IVH (intraventricular hemorrhage) ([place])   .  Term pregnancy  05/07/2015  . Spontaneous vaginal delivery 05/07/2015    [place] L 05/01/2016, 3:07 PM  [place] [address] [place], [place], [address] Phone: [phone]   Fax:  [phone]  Name: Ashley [name] MRN: [id number] Date of Birth: 26-Jan-1976    Geoffry Paradise, PT,DPT 05/01/2016 3:08 PM Phone: [phone] Fax: [phone]

## 2016-05-06 ENCOUNTER — Encounter: Payer: BLUE CROSS/BLUE SHIELD | Admitting: Occupational Therapy

## 2016-05-06 ENCOUNTER — Ambulatory Visit: Payer: BLUE CROSS/BLUE SHIELD

## 2016-05-08 ENCOUNTER — Encounter: Payer: BLUE CROSS/BLUE SHIELD | Admitting: Occupational Therapy

## 2016-05-08 ENCOUNTER — Ambulatory Visit: Payer: BLUE CROSS/BLUE SHIELD

## 2016-05-13 ENCOUNTER — Ambulatory Visit: Payer: BLUE CROSS/BLUE SHIELD

## 2016-05-13 ENCOUNTER — Ambulatory Visit: Payer: BLUE CROSS/BLUE SHIELD | Attending: Psychology

## 2016-05-13 ENCOUNTER — Encounter: Payer: BLUE CROSS/BLUE SHIELD | Admitting: Occupational Therapy

## 2016-05-13 DIAGNOSIS — F4323 Adjustment disorder with mixed anxiety and depressed mood: Secondary | ICD-10-CM | POA: Diagnosis present

## 2016-05-13 DIAGNOSIS — R2689 Other abnormalities of gait and mobility: Secondary | ICD-10-CM

## 2016-05-13 DIAGNOSIS — G8111 Spastic hemiplegia affecting right dominant side: Secondary | ICD-10-CM

## 2016-05-13 NOTE — Therapy (Signed)
[facility] [address] [place], [place], [address] Phone: [phone]   Fax:  [phone]  Physical Therapy Treatment  Patient Details  Name: Ashley [name] MRN: [id number] Date of Birth: 08-Dec-1975 No Data Recorded  Encounter Date: 05/13/2016      PT End of Session - 05/13/16 1251    Visit Number 77   Number of Visits 80   Date for PT Re-Evaluation 05/16/16   Authorization Type BCBS. Per Conan Bowens, visits now 30 for PT and OT total (15 per OT and 15 per PT). Pt wishes to continue at self pay after visit limit met.    Authorization - Visit Number 8   Authorization - Number of Visits 16   PT Start Time 1146   PT Stop Time 1229   PT Time Calculation (min) 43 min   Equipment Utilized During Treatment --  BWSTT harness   Activity Tolerance Patient tolerated treatment well   Behavior During Therapy WFL for tasks assessed/performed      Past Medical History:  Diagnosis Date  . Anxiety   . ICH (intracerebral hemorrhage) (HCC)   . Seizures (HCC)   . Stroke [facility])     Past Surgical History:  Procedure Laterality Date  . DILATION AND CURETTAGE OF UTERUS      There were no vitals filed for this visit.      Subjective Assessment - 05/13/16 1148    Subjective Pt denied falls or changes since last visit. Pt reported she had a bad HA last Thursday, which is why she cancelled.    Pertinent History Seizures, low BP   Patient Stated Goals "Be as close to normal as much as possible and to go back to running" Get back to taking care of my kids, 34 weeks old and 2 and 49 years old   Currently in Pain? No/denies                         [facility] Adult PT Treatment/Exercise - 05/13/16 1151      Ambulation/Gait   Ambulation/Gait Yes   Ambulation/Gait Assistance 6: Modified independent (Device/Increase time)   Ambulation/Gait Assistance Details Pt performed BWSTT 2x6-7 minute bouts (0.2 miles each bout). Pt  amb. without R AFO donned. Required Incr. time, pt experienced R ankle eversion over grassy terrain but pt maintained balance.     Ambulation Distance (Feet) 1000 Feet  75'x2 and 0.38milesx2   Assistive device None;Body weight support system   Gait Pattern Decreased stance time - right;Decreased weight shift to right;Right foot flat;Decreased hip/knee flexion - right;Step-through pattern;Decreased step length - left;Trunk rotated posteriorly on right   Ambulation Surface Level;Indoor;Unlevel;Outdoor;Paved;Grass   Stairs Yes   Stairs Assistance 5: Supervision;6: Modified independent (Device/Increase time)   Stairs Assistance Details (indicate cue type and reason) S to ensure safety   Stair Management Technique No rails;Step to pattern  while carrying 20 lbs.   Number of Stairs 12   Height of Stairs 6                PT Education - 05/13/16 1250    Education provided Yes   Education Details Discussed placing PT on hold due to progress and to allow time for R LE botox to wear off in order to assess R ankle DF and eversion.   Person(s) Educated Patient   Methods Explanation   Comprehension Verbalized understanding          PT Short  Term Goals - 03/06/16 0903      PT SHORT TERM GOAL #1   Title Pt will be IND in HEP to improve strength, balance, and endurance. Target date: 01/10/16.   Status Achieved     PT SHORT TERM GOAL #2   Title Pt will improve gait speed to >/=1.71f/sec to decrease falls risk. Target date: 07/27/15.   Status Achieved     PT SHORT TERM GOAL #3   Title Pt will ambulate 300' with LRAD, over even terrain, at MOD I level to improve functional mobility. Target date: 07/27/15.   Status Achieved     PT SHORT TERM GOAL #4   Title Pt will perform TUG in </=13.5 seconds with LRAD to decr. falls risk. Target date: 01/10/15.   Status Achieved     PT SHORT TERM GOAL #5   Title Perform BERG and write STG and LTG as appropriate. Target date: 07/27/15.   Status  Achieved     PT SHORT TERM GOAL #6   Title Pt will improve BERG to >/=51/56 to decr. falls risk. Target date: 07/27/15.   Status Achieved     PT SHORT TERM GOAL #7   Title Pt will amb. 500' over even/uneven terrain with LRAD at MOD I level to improve functional mobility. Target date: 01/10/16   Status Partially Met     PT SHORT TERM GOAL #8   Title Pt will ascend/descend curb with LRAD at MOD I level to improve functional mobility. Target date: 01/10/16   Status Partially Met     PT SHORT TERM GOAL #9   TITLE Pt will ascend/descend 12 steps with one handrail at MOD I level to improve functional mobility. Target date: 01/10/16   Status Achieved     PT SHORT TERM GOAL #10   TITLE Pt will incr. FGA score to >/=20/24 to decr. falls risk. Target date: 03/10/16   Status Partially Met     PT SHORT TERM GOAL #11   TITLE Pt will dance for 5 minutes with S and no LOB  to dance with family. Target date: 03/10/16   Status Partially Met     PT SHORT TERM GOAL #12   TITLE Pt will jump x5 reps with S to play with children at home. Target date: 03/10/16   Status Achieved     PT SHORT TERM GOAL #13   TITLE Pt will transfer into bed with use of stool (height 35") with S to improve functional mobility at home. Target date: 03/10/16   Status Achieved           PT Long Term Goals - 05/13/16 1255      PT LONG TERM GOAL #1   Title Pt will ambulate 1000' over even/uneven terrain, with LRAD, at MOD I level to improve functional mobiliity. Target date: 02/07/16/17.   Baseline All unmet LTGs will be carried over to new 5 week POC: 05/16/16   Status Achieved     PT LONG TERM GOAL #2   Title Pt will amb. 300' over even terrain without an AD, IND, to safely amb. in home. Target date: 04/11/16   Status Achieved     PT LONG TERM GOAL #3   Title Pt will improve gait speed to >/=2.67fsec, with LRAD, to amb. safely in the community. Target date: 11/12/14.   Status Achieved     PT LONG TERM GOAL #4   Title  Pt will verbalize plans to join a fitness center upon d/c from  PT to continue to maintain strength and endurance gains made during PT. Target date: 04/11/16.   Status Achieved     PT LONG TERM GOAL #5   Title Pt improve BERG score to >/=55/56 to decr. falls risk. Target date: 12/11/15   Status Achieved     PT LONG TERM GOAL #6   Title Pt will ascend/descend curb with LRAD at MOD I level to improve functional mobility. Target date: 02/07/16   Status Achieved     PT LONG TERM GOAL #7   Title Pt will ascend/descend 12 steps no handrail at MOD I level, while carrying 20 lbs.[name] to improve functional mobility and carry child at home. Target date: 04/11/16   Status Partially Met     PT LONG TERM GOAL #8   Title Pt will ascend/descend ladder at MOD I level in order to transfer in/out of water. Target date: 04/11/16   Status Deferred     PT LONG TERM GOAL  #9   TITLE Pt will be able to transfer on/off boat at MOD I level, and no LOB, in order improve functional mobility and participate in family activities. Target date: 04/11/16   Status Deferred     PT LONG TERM GOAL  #10   TITLE Pt will be IIND in progressed HEP in order to improve balance, strength and flexibility. Target date: 02/07/16   Status Achieved     PT LONG TERM GOAL  #11   TITLE Pt will improve DGI score to >/=20/24 to decr. falls risk. Target date: 02/07/16   Status Achieved     PT LONG TERM GOAL  #12   TITLE Pt will improve FGA score to >/=25/30 to decr. falls risk. Target date: 04/11/16   Status On-going               Plan - 05/13/16 1252    Clinical Impression Statement Pt demonstrated progress as she met LTGs 1 and 4. Pt partially met LTG 8, as she required S to ensure safety. Pt was able to amb. at 2.32mh during BWSTT, with incr. R ankle DF and improved eccentric control during RLE swing phase of gait, which decr. with fatigue. PT will continue to assess goals next session and place pt on hold in order to allow pt to  trial HEP and community activities and allow botox to wear off  in RLE and then assess R ankle DF and eversion MMT.    Rehab Potential Good   Clinical Impairments Affecting Rehab Potential Seizures which may limit intensity of PT   PT Frequency 2x / week   PT Duration 8 weeks   PT Treatment/Interventions ADLs/Self Care Home Management;Neuromuscular re-education;Cognitive remediation;Biofeedback;DME Instruction;Gait training;Stair training;Canalith Repostioning;Patient/family education;Orthotic Fit/Training;Balance training;Therapeutic exercise;Manual techniques;Therapeutic activities;Vestibular   PT Next Visit Plan Finish checking LTGs and place pt on hold.    PT Home Exercise Plan Stretching/strength/balance HEP   Consulted and Agree with Plan of Care Patient      Patient will benefit from skilled therapeutic intervention in order to improve the following deficits and impairments:  Abnormal gait, Decreased endurance, Impaired sensation, Decreased knowledge of precautions, Decreased activity tolerance, Decreased knowledge of use of DME, Decreased strength, Impaired UE functional use, Impaired tone, Decreased balance, Decreased mobility, Decreased cognition, Decreased range of motion, Decreased safety awareness, Decreased coordination, Impaired flexibility, Postural dysfunction  Visit Diagnosis: Other abnormalities of gait and mobility  Spastic hemiplegia affecting right dominant side ([facility]     Problem List Patient Active Problem List  Diagnosis Date Noted  . Spastic hemiplegia and hemiparesis affecting dominant side ([place]) 09/13/2015  . Adhesive capsulitis of right shoulder 08/24/2015  . Symptomatic partial epilepsy with simple partial seizures ([place]) 08/21/2015  . Nontraumatic cortical hemorrhage of cerebral hemisphere ([place])   . Seizure ([place])   . Seizures ([place])   . Adjustment disorder with depressed mood   . Contracture of muscle ankle and foot 06/11/2015  . Hemiplga fol ntrm intcrbl  hemor aff right dominant side 06/06/2015  . Left-sided intracerebral hemorrhage ([place]) 06/04/2015  . Seizure disorder as sequela of cerebrovascular accident ([place])   . Seizure disorder ([place]) 06/03/2015  . Sepsis ([place]) 06/03/2015  . UTI (urinary tract infection) 06/03/2015  . Hypotension 06/02/2015  . Right spastic hemiparesis ([place]) 05/28/2015  . 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 History of anxiety disorder 05/28/2015  . ICH (intracerebral hemorrhage) ([place])   . Seizure disorder, nonconvulsive, with status epilepticus ([place])   . Cerebral venous thrombosis of cortical vein   . Cytotoxic cerebral edema ([place])   . IVH (intraventricular hemorrhage) ([place])   . Term pregnancy 05/07/2015  . Spontaneous vaginal delivery 05/07/2015    Ashley [name] 05/13/2016, 12:56 PM  [facility] [address] [place], [place], [address] Phone: [phone]   Fax:  [phone]  Name: Ashley [name] MRN: [id number] Date of Birth: 05-Jan-1976   Geoffry Paradise, PT,DPT 05/13/16 12:56 PM Phone: [phone] Fax: [phone]

## 2016-05-15 ENCOUNTER — Ambulatory Visit: Payer: BLUE CROSS/BLUE SHIELD

## 2016-05-15 ENCOUNTER — Encounter: Payer: BLUE CROSS/BLUE SHIELD | Admitting: Occupational Therapy

## 2016-05-15 DIAGNOSIS — R2689 Other abnormalities of gait and mobility: Secondary | ICD-10-CM

## 2016-05-15 DIAGNOSIS — G8111 Spastic hemiplegia affecting right dominant side: Secondary | ICD-10-CM

## 2016-05-15 NOTE — Therapy (Signed)
[facility] [address] [place], [place], [address] Phone: [phone]   Fax:  [phone]  Physical Therapy Treatment  Patient Details  Name: Ashley [name] MRN: [id number] Date of Birth: 02-24-1976 No Data Recorded  Encounter Date: 05/15/2016      PT End of Session - 05/15/16 1606    Visit Number 32   Number of Visits 80   Date for PT Re-Evaluation 05/16/16   Authorization Type BCBS. Per Laverda Page, visits now 30 for PT and OT total (15 per OT and 15 per PT). Pt wishes to continue at self pay after visit limit met.    Authorization - Visit Number 55   Authorization - Number of Visits 16   PT Start Time 1532   PT Stop Time 1603  placing pt on hold   PT Time Calculation (min) 31 min   Equipment Utilized During Treatment Gait belt   Activity Tolerance Patient tolerated treatment well   Behavior During Therapy WFL for tasks assessed/performed      Past Medical History:  Diagnosis Date  . Anxiety   . ICH (intracerebral hemorrhage) ([place])   . Seizures ([place])   . Stroke [facility])     Past Surgical History:  Procedure Laterality Date  . DILATION AND CURETTAGE OF UTERUS      There were no vitals filed for this visit.      Subjective Assessment - 05/15/16 1534    Subjective Pt denied falls or changes since last visit.    Pertinent History Seizures, low BP   Patient Stated Goals "Be as close to normal as much as possible and to go back to running" Get back to taking care of my kids, 51 weeks old and 76 and 12 years old   Currently in Pain? No/denies            [facility] PT Assessment - 05/15/16 1548      Functional Gait  Assessment   Gait assessed  Yes   Gait Level Surface Walks 20 ft in less than 7 sec but greater than 5.5 sec, uses assistive device, slower speed, mild gait deviations, or deviates 6-10 in outside of the 12 in walkway width.   Change in Gait Speed Able to change speed, demonstrates mild gait  deviations, deviates 6-10 in outside of the 12 in walkway width, or no gait deviations, unable to achieve a major change in velocity, or uses a change in velocity, or uses an assistive device.   Gait with Horizontal Head Turns Performs head turns smoothly with no change in gait. Deviates no more than 6 in outside 12 in walkway width   Gait with Vertical Head Turns Performs head turns with no change in gait. Deviates no more than 6 in outside 12 in walkway width.   Gait and Pivot Turn Pivot turns safely within 3 sec and stops quickly with no loss of balance.   Step Over Obstacle Is able to step over 2 stacked shoe boxes taped together (9 in total height) without changing gait speed. No evidence of imbalance.   Gait with Narrow Base of Support Ambulates 4-7 steps.   Gait with Eyes Closed Walks 20 ft, no assistive devices, good speed, no evidence of imbalance, normal gait pattern, deviates no more than 6 in outside 12 in walkway width. Ambulates 20 ft in less than 7 sec.   Ambulating Backwards Walks 20 ft, uses assistive device, slower speed, mild gait deviations, deviates 6-10 in outside 12  in walkway width.   Steps Alternating feet, no rail.   Total Score 25                     [facility] Adult PT Treatment/Exercise - 05/15/16 1536      High Level Balance   High Level Balance Activities Other (comment)   High Level Balance Comments Pt ascended/descended ladder x4 steps to mimic climbing boat ladder and performed txfs on/off 14" step to rockerboard to mimim boat/dock txfs. Performed with min guard to S for safety.                 PT Education - 05/15/16 1604    Education provided Yes   Education Details PT discussed goal progress and placing PT on hold until 07/2016. PT will then assess RLE strength and balance and set new POC as needed. PT reviewed new strengthening HEP (R single leg squats and B LE lunges) per pt request, to ensure pt understood how to perform correctly.     Person(s) Educated Patient   Methods Explanation;Demonstration   Comprehension Verbalized understanding          PT Short Term Goals - 03/06/16 0903      PT SHORT TERM GOAL #1   Title Pt will be IND in HEP to improve strength, balance, and endurance. Target date: 01/10/16.   Status Achieved     PT SHORT TERM GOAL #2   Title Pt will improve gait speed to >/=1.97f/sec to decrease falls risk. Target date: 07/27/15.   Status Achieved     PT SHORT TERM GOAL #3   Title Pt will ambulate 300' with LRAD, over even terrain, at MOD I level to improve functional mobility. Target date: 07/27/15.   Status Achieved     PT SHORT TERM GOAL #4   Title Pt will perform TUG in </=13.5 seconds with LRAD to decr. falls risk. Target date: 01/10/15.   Status Achieved     PT SHORT TERM GOAL #5   Title Perform BERG and write STG and LTG as appropriate. Target date: 07/27/15.   Status Achieved     PT SHORT TERM GOAL #6   Title Pt will improve BERG to >/=51/56 to decr. falls risk. Target date: 07/27/15.   Status Achieved     PT SHORT TERM GOAL #7   Title Pt will amb. 500' over even/uneven terrain with LRAD at MOD I level to improve functional mobility. Target date: 01/10/16   Status Partially Met     PT SHORT TERM GOAL #8   Title Pt will ascend/descend curb with LRAD at MOD I level to improve functional mobility. Target date: 01/10/16   Status Partially Met     PT SHORT TERM GOAL #9   TITLE Pt will ascend/descend 12 steps with one handrail at MOD I level to improve functional mobility. Target date: 01/10/16   Status Achieved     PT SHORT TERM GOAL #10   TITLE Pt will incr. FGA score to >/=20/24 to decr. falls risk. Target date: 03/10/16   Status Partially Met     PT SHORT TERM GOAL #11   TITLE Pt will dance for 5 minutes with S and no LOB  to dance with family. Target date: 03/10/16   Status Partially Met     PT SHORT TERM GOAL #12   TITLE Pt will jump x5 reps with S to play with children at home.  Target date: 03/10/16   Status Achieved  PT SHORT TERM GOAL #13   TITLE Pt will transfer into bed with use of stool (height 35") with S to improve functional mobility at home. Target date: 03/10/16   Status Achieved           PT Long Term Goals - 05/15/16 1608      PT LONG TERM GOAL #1   Title Pt will ambulate 1000' over even/uneven terrain, with LRAD, at MOD I level to improve functional mobiliity. Target date: 02/07/16/17.   Baseline All unmet LTGs will be carried over to new 5 week POC: 05/16/16   Status Achieved     PT LONG TERM GOAL #2   Title Pt will amb. 300' over even terrain without an AD, IND, to safely amb. in home. Target date: 04/11/16   Status Achieved     PT LONG TERM GOAL #3   Title Pt will improve gait speed to >/=2.83f/sec, with LRAD, to amb. safely in the community. Target date: 11/12/14.   Status Achieved     PT LONG TERM GOAL #4   Title Pt will verbalize plans to join a fitness center upon d/c from PT to continue to maintain strength and endurance gains made during PT. Target date: 04/11/16.   Status Achieved     PT LONG TERM GOAL #5   Title Pt improve BERG score to >/=55/56 to decr. falls risk. Target date: 12/11/15   Status Achieved     PT LONG TERM GOAL #6   Title Pt will ascend/descend curb with LRAD at MOD I level to improve functional mobility. Target date: 02/07/16   Status Achieved     PT LONG TERM GOAL #7   Title Pt will ascend/descend 12 steps no handrail at MOD I level, while carrying 20 lbs.l to improve functional mobility and carry child at home. Target date: 04/11/16   Status Partially Met     PT LONG TERM GOAL #8   Title Pt will ascend/descend ladder at MOD I level in order to transfer in/out of water. Target date: 04/11/16   Status Partially Met     PT LONG TERM GOAL  #9   TITLE Pt will be able to transfer on/off boat at MOD I level, and no LOB, in order improve functional mobility and participate in family activities. Target date: 04/11/16    Status Partially Met     PT LONG TERM GOAL  #10   TITLE Pt will be IIND in progressed HEP in order to improve balance, strength and flexibility. Target date: 02/07/16   Status Achieved     PT LONG TERM GOAL  #11   TITLE Pt will improve DGI score to >/=20/24 to decr. falls risk. Target date: 02/07/16   Status Achieved     PT LONG TERM GOAL  #12   TITLE Pt will improve FGA score to >/=25/30 to decr. falls risk. Target date: 04/11/16   Status Achieved               Plan - 05/15/16 1607    Clinical Impression Statement Pt demonstrated progress as she met LTG 12 and partially met LTGs 8 and 9. PT placing pt on hold and pt will come back to therapy this fall to assess strength once botox wears off. PT will also assess functional mobility and balance at that time and determine new POC.    Rehab Potential Good   Clinical Impairments Affecting Rehab Potential Seizures which may limit intensity of PT   PT Frequency  2x / week   PT Duration 8 weeks   PT Treatment/Interventions ADLs/Self Care Home Management;Neuromuscular re-education;Cognitive remediation;Biofeedback;DME Instruction;Gait training;Stair training;Canalith Repostioning;Patient/family education;Orthotic Fit/Training;Balance training;Therapeutic exercise;Manual techniques;Therapeutic activities;Vestibular   PT Next Visit Plan placing pt on hold until 07/2016   PT Home Exercise Plan Stretching/strength/balance HEP   Consulted and Agree with Plan of Care Patient      Patient will benefit from skilled therapeutic intervention in order to improve the following deficits and impairments:  Abnormal gait, Decreased endurance, Impaired sensation, Decreased knowledge of precautions, Decreased activity tolerance, Decreased knowledge of use of DME, Decreased strength, Impaired UE functional use, Impaired tone, Decreased balance, Decreased mobility, Decreased cognition, Decreased range of motion, Decreased safety awareness, Decreased  coordination, Impaired flexibility, Postural dysfunction  Visit Diagnosis: Other abnormalities of gait and mobility  Spastic hemiplegia affecting right dominant side [facility])     Problem List Patient Active Problem List   Diagnosis Date Noted  . Spastic hemiplegia and hemiparesis affecting dominant side ([place]) 09/13/2015  . Adhesive capsulitis of right shoulder 08/24/2015  . Symptomatic partial epilepsy with simple partial seizures ([place]) 08/21/2015  . Nontraumatic cortical hemorrhage of cerebral hemisphere ([place])   . Seizure ([place])   . Seizures ([place])   . Adjustment disorder with depressed mood   . Contracture of muscle ankle and foot 06/11/2015  . Hemiplga fol ntrm intcrbl hemor aff right dominant side 06/06/2015  . Left-sided intracerebral hemorrhage ([place]) 06/04/2015  . Seizure disorder as sequela of cerebrovascular accident ([place])   . Seizure disorder ([place]) 06/03/2015  . Sepsis ([place]) 06/03/2015  . UTI (urinary tract infection) 06/03/2015  . Hypotension 06/02/2015  . Right spastic hemiparesis ([place]) 05/28/2015  . 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 History of anxiety disorder 05/28/2015  . ICH (intracerebral hemorrhage) ([place])   . Seizure disorder, nonconvulsive, with status epilepticus ([place])   . Cerebral venous thrombosis of cortical vein   . Cytotoxic cerebral edema ([place])   . IVH (intraventricular hemorrhage) ([place])   . Term pregnancy 05/07/2015  . Spontaneous vaginal delivery 05/07/2015    Tom Ragsdale L 05/15/2016, 4:17 PM  [facility] [address] [place], [place], [address] Phone: [phone]   Fax:  [phone]  Name: Ashley [name] MRN: [id number] Date of Birth: Jul 31, 1976   Geoffry Paradise, PT,DPT 05/15/16 4:17 PM Phone: [phone] Fax: [phone]

## 2016-05-19 ENCOUNTER — Encounter: Payer: BLUE CROSS/BLUE SHIELD | Admitting: Occupational Therapy

## 2016-05-20 ENCOUNTER — Ambulatory Visit (INDEPENDENT_AMBULATORY_CARE_PROVIDER_SITE_OTHER): Payer: BLUE CROSS/BLUE SHIELD | Admitting: Psychology

## 2016-05-20 DIAGNOSIS — F4323 Adjustment disorder with mixed anxiety and depressed mood: Secondary | ICD-10-CM

## 2016-05-20 NOTE — Progress Notes (Signed)
[facility]  [address]   Telephone [phone] [address] Fax [phone] [place], [place] [address]   Psychology Progress Note   Name:  Nivaeh Hofland Date of Birth:  1976-03-03 MRN:  [id number]  Date:05/20/2016 (37m) psychotherapy-Termination Note Primarily discussed her mild affective lability due to stroke and how that differs from depression.  She reported life is going well and that she is in good spirits.She has been deriving without incident. She plans to volunteer at her child's school this fall. She is considering a return to work next year.  Diagnostic Impression Adjustment disorder with mixed anxiety and depressed mood [F43.23]  Suggested that she discuss her mild affective lability with her psychiatric provider.  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  I informed her that I will be leaving my position with [facility] as of 06/27/16. I offered the options of referring her  to another provider, continuing treatment with me at my new practice location in [place], [place] or terminating psychological services. Given that we had already been discussing winding down treatment, it was not unexpected that she choose to end counseling at this time. I gave her my future phone number to contact me as needed.    Jamey Ripa, Ph.D Licensed Psychologist

## 2016-05-22 ENCOUNTER — Encounter: Payer: BLUE CROSS/BLUE SHIELD | Admitting: Occupational Therapy

## 2016-06-03 ENCOUNTER — Encounter: Payer: BLUE CROSS/BLUE SHIELD | Admitting: Occupational Therapy

## 2016-06-05 ENCOUNTER — Encounter: Payer: BLUE CROSS/BLUE SHIELD | Admitting: Occupational Therapy

## 2016-06-08 ENCOUNTER — Other Ambulatory Visit: Payer: Self-pay | Admitting: Neurology

## 2016-06-10 ENCOUNTER — Telehealth: Payer: Self-pay

## 2016-06-10 ENCOUNTER — Encounter: Payer: BLUE CROSS/BLUE SHIELD | Admitting: Occupational Therapy

## 2016-06-10 NOTE — Telephone Encounter (Signed)
Spoke with pt regarding cancelling 10/3 appt., as PT will be out of town. Pt wishes to just cancel appt and come to PT once that week.

## 2016-06-12 ENCOUNTER — Ambulatory Visit (INDEPENDENT_AMBULATORY_CARE_PROVIDER_SITE_OTHER): Payer: BLUE CROSS/BLUE SHIELD | Admitting: Neurology

## 2016-06-12 ENCOUNTER — Encounter: Payer: BLUE CROSS/BLUE SHIELD | Admitting: Occupational Therapy

## 2016-06-12 ENCOUNTER — Encounter: Payer: Self-pay | Admitting: Neurology

## 2016-06-12 VITALS — BP 91/62 | HR 70 | Ht 63.0 in | Wt 121.0 lb

## 2016-06-12 DIAGNOSIS — R569 Unspecified convulsions: Secondary | ICD-10-CM

## 2016-06-12 MED ORDER — LEVETIRACETAM 750 MG PO TABS: 1500.0000 mg | ORAL_TABLET | Freq: Two times a day (BID) | ORAL | 1 refills | Status: DC

## 2016-06-12 NOTE — Patient Instructions (Signed)
I had a long discussion the patient and husband regarding her remote intracerebral hemorrhage and seizures and gait difficulties. I recommend she continue Keppra 1500 mg twice daily and she is tolerating well without major side effects. I gave her a refill on the [place] I counseled her to walk slowly and carefully because of stiffness in her right leg to avoid falls and injury. She will return for follow-up in 6 months or call earlier if necessary

## 2016-06-12 NOTE — Progress Notes (Signed)
[facility] [address] [place]. [place] [address] [phone]       OFFICE FOLLOW-UP NOTE  [name] Date of Birth:  [birth year] Medical Record Number:  [id number]   HPI: 70 year Caucasian lady seen today for first office follow-up visit for in-hospital admission for intracerebral hemorrhage in August 2016.Ashley [name] is an 40 y.o. female admitted on 05/21/15 for severe HA for several days and developed right hemiparesis and aphasia as well as right focal seizure. She was 2 weeks post partum at that time. She was found to have large l 8 x 4 x 5 cm eft parietal ICH with IVH, cerebral edema, obstructive hydrocephalus and SAH. MRA and MRV were unremarkable. Concern was for cerebral cortical vein thrombosis not seen on MRV, however, RVCS was also on differential. She was treated with 3% saline and was stabilized over time. She also had a seizure, EEG showed left PLEDs, was put on Keppra for seizure control. Hypercoagulable labs were normal She was then discharged to CIR after stabilization.where , patient had seizure episode again, Keppra was increased to 1500 twice a day. Since then patient had no more seizure activity. She is working with PT OT and speech, had made great progress. She is able to walk with cane and making sentences and was scheduled to discharge next Wednesday. However, as per therapist, her language seems getting worse over the last 2 days, with more stuttering, and words finding difficulties, especially when patient more anxious and eager to speak. No decline of any arm leg weakness, no seizure activity. Neurology was consulted for further evaluation on 06/22/15. Repeat MRI scan of the brain showed resolving subacute left parietal hematoma but also showed  Acute gyral swelling and T2 hyperintensity involving the left hippocampal formation, left insula, and operculum raising possible postictal effect. Patient has since done well without recurrent seizures. She is  presently living at home with her husband. She is getting outpatient therapy. She is walking with an AFO but still has right foot drop and right foot is quite stiff. She's also noticed increased depression and sleepiness. Her primary care physician and started on Prozac 20 mg a day and Abilify has recently been added. She is also having significant right shoulder pain and Dr. Alden Server has given her injections which have helped only partially. She states her short-term memory is poor but her speech has recovered completely. She feels her headaches are better after increasing Topamax to 50 mg twice daily. Headache frequency noise about twice a week. Update 11/26/2015 ; she returns for follow-up after last visit 3 months ago accompanied by husband. She is doing well and has not had any recurrent seizures in fact she states even her headaches have disappeared. She remains on Topamax 50 mg twice daily which is tolerating well without side effects as well as Keppra 1500 mg twice daily. She is now seeing a psychiatrist for depression and Cymbalta has been changed to a new medication a week ago. She continues to have decreased short-term memory. She has trouble walking with dragging of the right foot. She does wear AFO and is getting Botox by Dr. Barbaraann Cao. She had follow-up MRI scan of the brain with contrast on 09/19/15 which I personally reviewed shows expected evolutionary changes in her left parietal hematoma with no enhancing lesions. The previously seen subtle left medial temporal hippocampal hyperintensity is not well appreciated and has not progressed. She wants to drive and be more independent but has not yet ready to return  to work. Update 06/12/2016 : She returns for follow-up after last visit 6 months ago. She is a complaint by her husband and son. She is doing well and has not had recurrent seizures for more than a year. She also had no headaches. She has quit wearing the brace as well as doing Botox as if she  feels has not helped her. She still walks with slight foot drop and dragging her leg but is careful. She in fact even tried the walk aid device which did not help her. She has not had any recent episodes of seizure or TIA like episodes. She is tolerating Keppra 1500 mg twice daily without any side effects ROS:   14 system review of systems is positive for   depression,   decreased concentration gait difficulty, dragging the right leg and  All other systems negative  PMH:  Past Medical History:  Diagnosis Date  . Anxiety   . ICH (intracerebral hemorrhage) ([place])   . Seizures ([place])   . Stroke [facility])     Social History:  Social History   Social History  . Marital status: Married    Spouse name: N/A  . Number of children: N/A  . Years of education: N/A   Occupational History  . Not on file.   Social History Main Topics  . Smoking status: Never Smoker  . Smokeless tobacco: Never Used  . Alcohol use 0.6 oz/week    1 Glasses of wine per week     Comment: casual   . Drug use: No  . Sexual activity: Not on file   Other Topics Concern  . Not on file   Social History Narrative  . No narrative on file    Medications:   Current Outpatient Prescriptions on File Prior to Visit  Medication Sig Dispense Refill  . aspirin EC 81 MG tablet Take 81 mg by mouth daily.    . Multiple Vitamin (MULTIVITAMIN) tablet Take by mouth.    . TRINTELLIX 20 MG TABS Take 2 tablets by mouth at bedtime.  0   Current Facility-Administered Medications on File Prior to Visit  Medication Dose Route Frequency Provider Last Rate Last Dose  . gadopentetate dimeglumine (MAGNEVIST) injection 12 mL  12 mL Intravenous Once PRN Garvin Fila, MD        Allergies:  No Known Allergies  Physical Exam General: Pleasant young Caucasian lady, seated, in no evident distress Head: head normocephalic and atraumatic.  Neck: supple with no carotid or supraclavicular bruits Cardiovascular: regular rate and rhythm, no  murmurs Musculoskeletal: no deformity Skin:  no rash/petichiae Vascular:  Normal pulses all extremities Vitals:   06/12/16 1640  BP: 91/62  Pulse: 70   Neurologic Exam Mental Status: Awake and fully alert. Oriented to place and time. Recent and remote memory intact. Attention span, concentration and fund of knowledge appropriate. Mood and affect appropriate. Speech is fluent without paraphasias or word finding difficulty Cranial Nerves: Fundoscopic exam not done  . Pupils equal, briskly reactive to light. Extraocular movements full without nystagmus. Visual fields full to confrontation. Hearing intact. Facial sensation intact. Face, tongue, palate moves normally and symmetrically. Right nasolabial fold asymmetry when she smiles. Motor: Normal bulk and tone. Normal strength in all tested extremity muscles. Diminished fine finger movements on the right. Orbits left or right upper extremity. Mild weakness of right hip flexors. Right foot drop with ankle dorsi flexor weakness. Increased tone and spasticity in the right leg. Sensory.: intact to touch ,pinprick .position  and vibratory sensation.  Coordination: Rapid alternating movements normal in all extremities. Finger-to-nose and heel-to-shin performed accurately bilaterally. Gait and Station: Arises from chair without difficulty. Stance is normal. Gait demonstrates dragging of the right foot with stiffness and right foot drop   Reflexes: 2+ and asymmetric and brisker on the right. Toes downgoing.       ASSESSMENT: 41 year Caucasian lady with postpartum large left parietal parenchymal/subarachnoid hemorrhage with seizures in august 2016 possibly from cortical vein thrombosis. MR venogram and workup for hypercoagulable disorders was negative. Follow-up MRI scan in December 2016 shows expected evolutionary changes and resolution of previously seen left medial temporal lobe hyperintensity of unclear significance    PLAN: I had a long discussion  the patient and husband regarding her remote intracerebral hemorrhage and seizures and gait difficulties. I recommend she continue Keppra 1500 mg twice daily and she is tolerating well without major side effects. I gave her a refill on the [place] I counseled her to walk slowly and carefully because of stiffness in her right leg to avoid falls and injury. Greater than 50% time during this 25 minute visit was spent on counseling and coordination of care about stroke, seizures and spasticity She will return for follow-up in 6 months or call earlier if necessary Antony Contras, MD  Note: This document was prepared with digital dictation and possible smart phrase technology. Any transcriptional errors that result from this process are unintentional

## 2016-06-23 ENCOUNTER — Ambulatory Visit (HOSPITAL_BASED_OUTPATIENT_CLINIC_OR_DEPARTMENT_OTHER): Payer: BLUE CROSS/BLUE SHIELD | Admitting: Physical Medicine & Rehabilitation

## 2016-06-23 ENCOUNTER — Encounter: Payer: BLUE CROSS/BLUE SHIELD | Attending: Physical Medicine & Rehabilitation

## 2016-06-23 ENCOUNTER — Encounter: Payer: Self-pay | Admitting: Physical Medicine & Rehabilitation

## 2016-06-23 VITALS — BP 99/65 | HR 80 | Resp 14

## 2016-06-23 DIAGNOSIS — R261 Paralytic gait: Secondary | ICD-10-CM

## 2016-06-23 DIAGNOSIS — R2689 Other abnormalities of gait and mobility: Principal | ICD-10-CM

## 2016-06-23 DIAGNOSIS — M21379 Foot drop, unspecified foot: Secondary | ICD-10-CM

## 2016-06-23 DIAGNOSIS — I69151 Hemiplegia and hemiparesis following nontraumatic intracerebral hemorrhage affecting right dominant side: Secondary | ICD-10-CM | POA: Insufficient documentation

## 2016-06-23 DIAGNOSIS — I69120 Aphasia following nontraumatic intracerebral hemorrhage: Secondary | ICD-10-CM | POA: Insufficient documentation

## 2016-06-23 DIAGNOSIS — M7501 Adhesive capsulitis of right shoulder: Secondary | ICD-10-CM

## 2016-06-23 DIAGNOSIS — Z79899 Other long term (current) drug therapy: Secondary | ICD-10-CM | POA: Diagnosis not present

## 2016-06-23 DIAGNOSIS — G8111 Spastic hemiplegia affecting right dominant side: Secondary | ICD-10-CM | POA: Diagnosis not present

## 2016-06-23 DIAGNOSIS — F419 Anxiety disorder, unspecified: Secondary | ICD-10-CM | POA: Diagnosis not present

## 2016-06-23 DIAGNOSIS — G8110 Spastic hemiplegia affecting unspecified side: Secondary | ICD-10-CM

## 2016-06-23 NOTE — Patient Instructions (Signed)
We discussed options to help with dropfoot and clearing the toes. This includes trying the carbon fiber orthosis such as the Genia Del  We also discussed trying a higher dose of Dysport, which is now FDA approved for up to 1500 units. We also discussed referral to orthopedic surgeon to evaluate for split Achilles tendon surgery

## 2016-06-23 NOTE — Progress Notes (Signed)
Subjective:    Patient ID: Ashley [name], female    DOB: May 16, 1976, 40 y.o.   MRN: [id number] 40 year old female who is possible one year post-CVA HPI 10-36min of Elliptical and walking on treadmill 3 times a week Continues with outpatient OT. Had follow-up with neurology. No new medications started. No seizures on Keppra. No significant headaches. Continues see a Social worker. Her neuropsychologist is moving his practice to [facility]  Major concern is foot drop on the right side. We discussed what has been done so far which is included electrostimulation as well as it up orthosis. In addition to physical therapy with strengthening and stretching of the Achilles tendon. He is doing some compensatory hip hiking. She is aware of this. She denies any hip or knee pain. At the current time Pain Inventory Average Pain 0 Pain Right Now 0 My pain is no pain  In the last 24 hours, has pain interfered with the following? General activity 0 Relation with others 0 Enjoyment of life 0 What TIME of day is your pain at its worst? no pain Sleep (in general) Fair  Pain is worse with: unsure Pain improves with: therapy/exercise and medication Relief from Meds: no pain  Mobility walk without assistance how many minutes can you walk? 60+ ability to climb steps?  yes do you drive?  yes Do you have any goals in this area?  yes  Function not employed: date last employed . Do you have any goals in this area?  yes  Neuro/Psych numbness depression anxiety  Prior Studies Any changes since last visit?  no  Physicians involved in your care Any changes since last visit?  no   Family History  Problem Relation Age of Onset  . Cancer Mother   . Cancer Father     pancreatic   Social History   Social History  . Marital status: Married    Spouse name: N/A  . Number of children: N/A  . Years of education: N/A   Social History Main Topics  . Smoking status: Never Smoker  . Smokeless  tobacco: Never Used  . Alcohol use 0.6 oz/week    1 Glasses of wine per week     Comment: casual   . Drug use: No  . Sexual activity: Not Asked   Other Topics Concern  . None   Social History Narrative  . None   Past Surgical History:  Procedure Laterality Date  . DILATION AND CURETTAGE OF UTERUS     Past Medical History:  Diagnosis Date  . Anxiety   . ICH (intracerebral hemorrhage) ([place])   . Seizures ([place])   . Stroke ([place])    BP 99/65 (BP Location: Left Arm, Patient Position: Sitting, Cuff Size: Normal)   Pulse 80   Resp 14   SpO2 97%   Opioid Risk Score:   Fall Risk Score:  `1  Depression screen PHQ 2/9  Depression screen PHQ 2/9 07/16/2015  Decreased Interest 1  Down, Depressed, Hopeless 1  PHQ - 2 Score 2  Altered sleeping 0  Tired, decreased energy 1  Change in appetite 1  Feeling bad or failure about yourself  1  Trouble concentrating 1  Moving slowly or fidgety/restless 1  Suicidal thoughts 1  PHQ-9 Score 8  Difficult doing work/chores Somewhat difficult  Some recent data might be hidden    Review of Systems  Constitutional: Positive for appetite change.  HENT: Negative.   Eyes: Negative.   Respiratory: Negative.   Cardiovascular: Negative.  Endocrine: Negative.   Genitourinary: Negative.   Allergic/Immunologic: Negative.   Neurological: Positive for numbness.  Hematological: Negative.   Psychiatric/Behavioral: Positive for dysphoric mood. The patient is nervous/anxious.   All other systems reviewed and are negative.      Objective:   Physical Exam Motor strength is 5 minus in the right deltoid, biceps, triceps, grip Left side is 5/5 left deltoid, biceps, triceps, grip Left lower extremity 5/5 in the hip flexor, knee extensor, ankle dorsiflexor, plantar flexor. Right lower extremity 4 minus in the hip flexor and knee extensor, 3 minus and the ankle dorsiflexor and plantar flexor Positive dysdiadochokinesis with rapid alternating  supination and pronation. The right upper extremity.  Sensation reduced to light touch in the right lower extremity compared to the left equal in both upper extremities.  Ambulates with right foot drag. She is able to clear this with compensation from her hip flexors. She has no evidence of genu recurvatum at the current time.  Speech is without evidence of word finding deficits. Her fluency     Assessment & Plan:  1. Right spastic hemiparesis. At this point, mainly involving the lower extremity. She has a spastic foot drop, complicated by Achilles contracture. At this point. We discussed treatment options. These include the following  Carbon fiber AFO, [place], toe up  Trial of Dysport 1500 units Would recommend Gastroc 300 units Soleus 500 units Posterior tibialis 300 units Flexor digitorum longus, 200 units Flexor, hallucis longus, 200 units  Patient will call if she wants to pursue this  We also discussed potential for surgical intervention,tendon transfer referral to [facility]  Return to clinic 6 months unless she wishes to pursue the botulinum toxin injection

## 2016-07-08 ENCOUNTER — Ambulatory Visit: Payer: BLUE CROSS/BLUE SHIELD | Attending: Psychology | Admitting: Occupational Therapy

## 2016-07-08 ENCOUNTER — Encounter: Payer: Self-pay | Admitting: Occupational Therapy

## 2016-07-08 DIAGNOSIS — R293 Abnormal posture: Secondary | ICD-10-CM | POA: Insufficient documentation

## 2016-07-08 DIAGNOSIS — R2689 Other abnormalities of gait and mobility: Secondary | ICD-10-CM | POA: Insufficient documentation

## 2016-07-08 DIAGNOSIS — R208 Other disturbances of skin sensation: Secondary | ICD-10-CM | POA: Insufficient documentation

## 2016-07-08 DIAGNOSIS — R41844 Frontal lobe and executive function deficit: Secondary | ICD-10-CM

## 2016-07-08 DIAGNOSIS — G8111 Spastic hemiplegia affecting right dominant side: Secondary | ICD-10-CM

## 2016-07-08 DIAGNOSIS — M25611 Stiffness of right shoulder, not elsewhere classified: Secondary | ICD-10-CM | POA: Insufficient documentation

## 2016-07-08 NOTE — Therapy (Signed)
[facility] [address] [place], [place], [address] Phone: [phone]   Fax:  [phone]  Occupational Therapy Treatment  Patient Details  Name: Ashley [name] MRN: [id number] Date of Birth: 1975/10/17 No Data Recorded  Encounter Date: 07/08/2016      OT End of Session - 07/08/16 1355    Visit Number 50   Number of Visits 25   Date for OT Re-Evaluation 08/05/16   Authorization Type BCBS   Authorization Time Period 30 visit COMBINED between PT/OT;  pt has utilized benefit and is now self pay.    OT Start Time 1017   OT Stop Time 1059   OT Time Calculation (min) 42 min   Activity Tolerance Patient tolerated treatment well      Past Medical History:  Diagnosis Date  . Anxiety   . ICH (intracerebral hemorrhage) ([place])   . Seizures ([place])   . Stroke [facility])     Past Surgical History:  Procedure Laterality Date  . DILATION AND CURETTAGE OF UTERUS      There were no vitals filed for this visit.      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   Pertinent History see epic snapshot; pt with SAH 2 weeks after deliverying her 3rd child   Patient Stated Goals to be able to use my R arm normally   Currently in Pain? No/denies                      OT Treatments/Exercises (OP) - 07/08/16 0001      Neurological Re-education Exercises   Other Exercises 1 Neuro re ed to address full overhead reach bilaterally in closed chain activity in sitting with mod facilitation to achieve 175* of overhead reach.  Also addressed scapular and trunk alignment in full overhead reach in sitting and prone.      Manual Therapy   Manual Therapy Scapular mobilization;Joint mobilization;Soft tissue mobilization   Manual therapy comments scapular, joint and soft tissue mob in prep for neuro re ed to address full overhead reach in sitting.                   OT Short Term Goals - 04/24/16 1127      OT SHORT TERM GOAL  #11   TITLE Pt will be mod I with upgraded HEP - 01/08/2016   Status Achieved     OT SHORT TERM GOAL  #12   TITLE Pt will be able to pick up 8 pound object x 4 trials bilaterally to place on overhead shelf requiring at least 120* of shoulder flexion (for household mgmt tasks, grocery shopping ,etc)   Status Achieved     OT SHORT TERM GOAL  #13   TITLE Pt will be able to lift lightweight object unilaterally with RUE to 120* of shoulder flexion (baseline 115*)   Status Achieved  able to lift 4 pound object to this height with very minimal compensations     OT SHORT TERM GOAL  #14   TITLE Pt will be able to lift baby into and out of tub while in kneeling from and to baby seat using both UE's - 03/11/2016   Status Achieved  Pt sat on gma's lap and pt able to lift baby from gma's lap into tub and then lift baby out of tub and hand to [facility]  OT Long Term Goals - 07/08/16 1350      OT LONG TERM GOAL #1   Title Pt will be mod I with upgraded HEP - 08/05/2016   Status New     OT LONG TERM GOAL #2   Title Pt will demonstrate ability to lift 10 pound object into and out of overhead shelf with RUE   Status On-going     OT LONG TERM GOAL #3   Title Pt will demonstrate 140* of overhead unilateral reach with RUE to assist with functional tasks.   Status New     OT LONG TERM GOAL #4   Title Pt will demonstrate 135* of abduction in RUE to assist with functional tasks.    Status New               Plan - 07/08/16 1352    Clinical Impression Statement Pt returns today after brief hold from therapies. Pt demonstrates approximately 135* of R shoulder flexion and 130* of abduction.  Pt has maintained prior AROM of RUE and demonstrates good strength for mid reach (5/5 at mid reach level).  Pt remains with deficits that limit full overhead reach and functional use of RUE as well as decreased  trunk contol relative to RUE overhead use, balance and functional ambulation.  Pt will benefit from short course of OT to address these areas to improve RUE use and mobitlity.    Rehab Potential Good   Clinical Impairments Affecting Rehab Potential cognition, impaired sensation and anxiety   OT Frequency 2x / week   OT Duration 4 weeks   OT Treatment/Interventions Self-care/ADL training;Therapeutic exercise;Patient/family education;Balance training;Ultrasound;Neuromuscular education;Manual Therapy;Splinting;Therapeutic exercises;Energy conservation;Parrafin;DME and/or AE instruction;Therapeutic activities;Cognitive remediation/compensation;Gait Training;Fluidtherapy;Electrical Stimulation;Moist Heat;Contrast Bath;Passive range of motion;Visual/perceptual remediation/compensation   Plan NMR for RUE   Consulted and Agree with Plan of Care Patient      Patient will benefit from skilled therapeutic intervention in order to improve the following deficits and impairments:  Abnormal gait, Decreased balance, Decreased range of motion, Decreased strength, Impaired UE functional use, Impaired tone, Impaired sensation  Visit Diagnosis: Spastic hemiplegia affecting right dominant side ([place]) - Plan: Ot plan of care cert/re-cert  Abnormal posture - Plan: Ot plan of care cert/re-cert  Other disturbances of skin sensation - Plan: Ot plan of care cert/re-cert  Frontal lobe and executive function deficit - Plan: Ot plan of care cert/re-cert  Stiffness of right shoulder, not elsewhere classified - Plan: Ot plan of care cert/re-cert  Other abnormalities of gait and mobility - Plan: Ot plan of care cert/re-cert    Problem List Patient Active Problem List   Diagnosis Date Noted  . Spastic hemiplegia and hemiparesis affecting dominant side ([place]) 09/13/2015  . Adhesive capsulitis of right shoulder 08/24/2015  . Symptomatic partial epilepsy with simple partial seizures ([place]) 08/21/2015  . Nontraumatic  cortical hemorrhage of cerebral hemisphere ([place])   . Seizure ([place])   . Seizures ([place])   . Adjustment disorder with depressed mood   . Contracture of muscle ankle and foot 06/11/2015  . Hemiplga fol ntrm intcrbl hemor aff right dominant side 06/06/2015  . Left-sided intracerebral hemorrhage ([place]) 06/04/2015  . Seizure disorder as sequela of cerebrovascular accident ([place])   . Seizure disorder ([place]) 06/03/2015  . Sepsis ([place]) 06/03/2015  . UTI (urinary tract infection) 06/03/2015  . Hypotension 06/02/2015  . Right spastic hemiparesis ([place]) 05/28/2015  . 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 History of anxiety disorder 05/28/2015  . ICH (intracerebral hemorrhage) ([place])   .  Seizure disorder, nonconvulsive, with status epilepticus ([place])   . Cerebral venous thrombosis of cortical vein   . Cytotoxic cerebral edema ([place])   . IVH (intraventricular hemorrhage) ([place])   . Term pregnancy 05/07/2015  . Spontaneous vaginal delivery 05/07/2015    Quay Burow, OTR/L 07/08/2016, 2:04 PM  [facility] [address] [place], [place], [address] Phone: [phone]   Fax:  [phone]  Name: Ashley [name] MRN: [id number] Date of Birth: [birth year]

## 2016-07-10 ENCOUNTER — Ambulatory Visit: Payer: BLUE CROSS/BLUE SHIELD | Admitting: Occupational Therapy

## 2016-07-10 ENCOUNTER — Encounter: Payer: Self-pay | Admitting: Occupational Therapy

## 2016-07-10 DIAGNOSIS — G8111 Spastic hemiplegia affecting right dominant side: Secondary | ICD-10-CM

## 2016-07-10 DIAGNOSIS — R208 Other disturbances of skin sensation: Secondary | ICD-10-CM

## 2016-07-10 DIAGNOSIS — R293 Abnormal posture: Secondary | ICD-10-CM

## 2016-07-10 DIAGNOSIS — M25611 Stiffness of right shoulder, not elsewhere classified: Secondary | ICD-10-CM

## 2016-07-10 DIAGNOSIS — R41844 Frontal lobe and executive function deficit: Secondary | ICD-10-CM

## 2016-07-10 NOTE — Therapy (Signed)
[facility] [address] [place], [place], [address] Phone: [phone]   Fax:  [phone]  Occupational Therapy Treatment  Patient Details  Name: Ashley [name] MRN: [id number] Date of Birth: 22-Feb-1976 No Data Recorded  Encounter Date: 07/10/2016      OT End of Session - 07/10/16 1217    Visit Number 64   Number of Visits 36   Date for OT Re-Evaluation 08/05/16   Authorization Type BCBS   Authorization Time Period 30 visit COMBINED between PT/OT;  pt has utilized benefit and is now self pay.    OT Start Time 1017   OT Stop Time 1101   OT Time Calculation (min) 44 min   Activity Tolerance Patient tolerated treatment well      Past Medical History:  Diagnosis Date  . Anxiety   . ICH (intracerebral hemorrhage) ([place])   . Seizures ([place])   . Stroke [facility])     Past Surgical History:  Procedure Laterality Date  . DILATION AND CURETTAGE OF UTERUS      There were no vitals filed for this visit.      Subjective Assessment - 07/10/16 1024    Subjective  I was awake all night last night because my son couldn't sleep.    Pertinent History see epic snapshot; pt with SAH 2 weeks after deliverying her 3rd child   Patient Stated Goals to be able to use my R arm normally   Currently in Pain? No/denies                      OT Treatments/Exercises (OP) - 07/10/16 0001      Neurological Re-education Exercises   Other Exercises 1 Neuro re ed to address scapular stabilty and stability with  mobility to activate into more ER, overhead reach in straight plane flexion, as well as abduction in sitting and the with lateral weight shifts to the R;  finally introduced resistance at end range to improve proximal stability with overhead reach.       Manual Therapy   Manual Therapy Scapular mobilization;Joint mobilization;Soft tissue mobilization   Manual therapy comments scapular, joint and soft tissue mob to  address tightness and limited PROM in L shoulder girdle in terms of external rotation, shoulder flexion and abduction                  OT Short Term Goals - 04/24/16 1127      OT SHORT TERM GOAL  #11   TITLE Pt will be mod I with upgraded HEP - 01/08/2016   Status Achieved     OT SHORT TERM GOAL  #12   TITLE Pt will be able to pick up 8 pound object x 4 trials bilaterally to place on overhead shelf requiring at least 120* of shoulder flexion (for household mgmt tasks, grocery shopping ,etc)   Status Achieved     OT SHORT TERM GOAL  #13   TITLE Pt will be able to lift lightweight object unilaterally with RUE to 120* of shoulder flexion (baseline 115*)   Status Achieved  able to lift 4 pound object to this height with very minimal compensations     OT SHORT TERM GOAL  #14   TITLE Pt will be able to lift baby into and out of tub while in kneeling from and to baby seat using both UE's - 03/11/2016   Status Achieved  Pt sat on gma's lap and pt able to  lift baby from gma's lap into tub and then lift baby out of tub and hand to [place] Term Goals - 07/10/16 1216      OT LONG TERM GOAL #1   Title Pt will be mod I with upgraded HEP - 08/05/2016   Status On-going     OT LONG TERM GOAL #2   Title Pt will demonstrate ability to lift 10 pound object into and out of overhead shelf with RUE   Status On-going     OT LONG TERM GOAL #3   Title Pt will demonstrate 140* of overhead unilateral reach with RUE to assist with functional tasks.   Status On-going     OT LONG TERM GOAL #4   Title Pt will demonstrate 135* of abduction in RUE to assist with functional tasks.    Status On-going               Plan - 07/10/16 1216    Clinical Impression Statement Pt continues to make good progress toward goals.  Reviewd new LTG's with pt and pt in agreement.    Clinical Impairments Affecting Rehab Potential cognition, impaired sensation and anxiety   OT Frequency 2x  / week   OT Duration 4 weeks   OT Treatment/Interventions Self-care/ADL training;Therapeutic exercise;Patient/family education;Balance training;Ultrasound;Neuromuscular education;Manual Therapy;Splinting;Therapeutic exercises;Energy conservation;Parrafin;DME and/or AE instruction;Therapeutic activities;Cognitive remediation/compensation;Gait Training;Fluidtherapy;Electrical Stimulation;Moist Heat;Contrast Bath;Passive range of motion;Visual/perceptual remediation/compensation   Plan manual therapy, NMR for RUE overhead reach with strength as well as abduction with strength   Consulted and Agree with Plan of Care Patient      Patient will benefit from skilled therapeutic intervention in order to improve the following deficits and impairments:  Abnormal gait, Decreased balance, Decreased range of motion, Decreased strength, Impaired UE functional use, Impaired tone, Impaired sensation  Visit Diagnosis: Spastic hemiplegia affecting right dominant side (HCC)  Abnormal posture  Other disturbances of skin sensation  Frontal lobe and executive function deficit  Stiffness of right shoulder, not elsewhere classified    Problem List Patient Active Problem List   Diagnosis Date Noted  . Spastic hemiplegia and hemiparesis affecting dominant side ([place]) 09/13/2015  . Adhesive capsulitis of right shoulder 08/24/2015  . Symptomatic partial epilepsy with simple partial seizures ([place]) 08/21/2015  . Nontraumatic cortical hemorrhage of cerebral hemisphere ([place])   . Seizure ([place])   . Seizures ([place])   . Adjustment disorder with depressed mood   . Contracture of muscle ankle and foot 06/11/2015  . Hemiplga fol ntrm intcrbl hemor aff right dominant side 06/06/2015  . Left-sided intracerebral hemorrhage ([place]) 06/04/2015  . Seizure disorder as sequela of cerebrovascular accident ([place])   . Seizure disorder ([place]) 06/03/2015  . Sepsis ([place]) 06/03/2015  . UTI (urinary tract infection) 06/03/2015  .  Hypotension 06/02/2015  . Right spastic hemiparesis ([place]) 05/28/2015  . 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 History of anxiety disorder 05/28/2015  . ICH (intracerebral hemorrhage) ([place])   . Seizure disorder, nonconvulsive, with status epilepticus ([place])   . Cerebral venous thrombosis of cortical vein   . Cytotoxic cerebral edema ([place])   . IVH (intraventricular hemorrhage) ([place])   . Term pregnancy 05/07/2015  . Spontaneous vaginal delivery 05/07/2015    Quay Burow, OTR/L 07/10/2016, 12:19 PM  [facility] [address] [place], [place], [address] Phone: [phone]   Fax:  [phone]  Name: Ashley [name] MRN: [id number] Date  of Birth: 1976-02-19

## 2016-07-15 ENCOUNTER — Ambulatory Visit: Payer: BLUE CROSS/BLUE SHIELD | Attending: Psychology | Admitting: Occupational Therapy

## 2016-07-15 ENCOUNTER — Ambulatory Visit: Payer: BLUE CROSS/BLUE SHIELD

## 2016-07-15 ENCOUNTER — Encounter: Payer: Self-pay | Admitting: Occupational Therapy

## 2016-07-15 DIAGNOSIS — I679 Cerebrovascular disease, unspecified: Secondary | ICD-10-CM | POA: Diagnosis present

## 2016-07-15 DIAGNOSIS — R208 Other disturbances of skin sensation: Secondary | ICD-10-CM

## 2016-07-15 DIAGNOSIS — R2689 Other abnormalities of gait and mobility: Secondary | ICD-10-CM | POA: Diagnosis present

## 2016-07-15 DIAGNOSIS — M25611 Stiffness of right shoulder, not elsewhere classified: Secondary | ICD-10-CM | POA: Diagnosis present

## 2016-07-15 DIAGNOSIS — I69851 Hemiplegia and hemiparesis following other cerebrovascular disease affecting right dominant side: Secondary | ICD-10-CM | POA: Insufficient documentation

## 2016-07-15 DIAGNOSIS — I6789 Other cerebrovascular disease: Secondary | ICD-10-CM

## 2016-07-15 DIAGNOSIS — G8111 Spastic hemiplegia affecting right dominant side: Secondary | ICD-10-CM | POA: Diagnosis present

## 2016-07-15 DIAGNOSIS — R41844 Frontal lobe and executive function deficit: Secondary | ICD-10-CM | POA: Diagnosis present

## 2016-07-15 DIAGNOSIS — R293 Abnormal posture: Secondary | ICD-10-CM | POA: Diagnosis present

## 2016-07-15 NOTE — Therapy (Signed)
[facility] [address] [place], [place], [address] Phone: [phone]   Fax:  [phone]  Occupational Therapy Treatment  Patient Details  Name: Ashley [name] MRN: [id number] Date of Birth: 1976/07/11 No Data Recorded  Encounter Date: 07/15/2016      OT End of Session - 07/15/16 1302    Visit Number 70   Number of Visits 89   Date for OT Re-Evaluation 08/05/16   Authorization Type BCBS   Authorization Time Period 30 visit COMBINED between PT/OT;  pt has utilized benefit and is now self pay.    OT Start Time 1018   OT Stop Time 1101   OT Time Calculation (min) 43 min   Activity Tolerance Patient tolerated treatment well      Past Medical History:  Diagnosis Date  . Anxiety   . ICH (intracerebral hemorrhage) ([place])   . Seizures ([place])   . Stroke [facility])     Past Surgical History:  Procedure Laterality Date  . DILATION AND CURETTAGE OF UTERUS      There were no vitals filed for this visit.      Subjective Assessment - 07/15/16 1025    Subjective  I did alot of really fun things this weekeend.    Pertinent History see epic snapshot; pt with SAH 2 weeks after deliverying her 3rd child   Patient Stated Goals to be able to use my R arm normally   Currently in Pain? No/denies                      OT Treatments/Exercises (OP) - 07/15/16 0001      Neurological Re-education Exercises   Other Exercises 1 Neuro re ed following manual therapy to activate into available range with emphasis on ER, shoulder depression and scapular control related to trunk control  Pt able to achieve 140* of shoulder flexion and 140* of abduction today with ease.  Pt with improving strength in overhead reach as well tolerating significant manual resistance.      Manual Therapy   Manual Therapy Scapular mobilization;Joint mobilization;Soft tissue mobilization   Manual therapy comments Joint, soft tissue and scapular mob  to increase available ROM prior to neuro re ed.  Pt is gaining ROM in flexion, abduction and ER and then able to activate into range.                   OT Short Term Goals - 07/15/16 1300      OT SHORT TERM GOAL  #11   TITLE Pt will be mod I with upgraded HEP - 01/08/2016   Status Achieved     OT SHORT TERM GOAL  #12   TITLE Pt will be able to pick up 8 pound object x 4 trials bilaterally to place on overhead shelf requiring at least 120* of shoulder flexion (for household mgmt tasks, grocery shopping ,etc)   Status Achieved     OT SHORT TERM GOAL  #13   TITLE Pt will be able to lift lightweight object unilaterally with RUE to 120* of shoulder flexion (baseline 115*)   Status Achieved  able to lift 4 pound object to this height with very minimal compensations     OT SHORT TERM GOAL  #14   TITLE Pt will be able to lift baby into and out of tub while in kneeling from and to baby seat using both UE's - 03/11/2016   Status Achieved  Pt sat on  gma's lap and pt able to lift baby from gma's lap into tub and then lift baby out of tub and hand to [place] - 07/15/16 1300      OT LONG TERM GOAL #1   Title Pt will be mod I with upgraded HEP - 08/05/2016   Status On-going     OT LONG TERM GOAL #2   Title Pt will demonstrate ability to lift 10 pound object into and out of overhead shelf with RUE   Status On-going     OT LONG TERM GOAL #3   Title Pt will demonstrate 140* of overhead unilateral reach with RUE to assist with functional tasks.   Status Achieved     OT LONG TERM GOAL #4   Title Pt will demonstrate 135* of abduction in RUE to assist with functional tasks.    Status Achieved  140*               Plan - 07/15/16 1301    Clinical Impression Statement Pt with improved AROM/strength in RUE for overhead reach.  Pt has achieved 2/4 LTG's.    Rehab Potential Good   Clinical Impairments Affecting Rehab Potential cognition, impaired  sensation and anxiety   OT Frequency 2x / week   OT Duration 4 weeks   OT Treatment/Interventions Self-care/ADL training;Therapeutic exercise;Patient/family education;Balance training;Ultrasound;Neuromuscular education;Manual Therapy;Splinting;Therapeutic exercises;Energy conservation;Parrafin;DME and/or AE instruction;Therapeutic activities;Cognitive remediation/compensation;Gait Training;Fluidtherapy;Electrical Stimulation;Moist Heat;Contrast Bath;Passive range of motion;Visual/perceptual remediation/compensation   Plan check ability to lift 10 pounds overhead, review and upgrade HEP.    Consulted and Agree with Plan of Care Patient      Patient will benefit from skilled therapeutic intervention in order to improve the following deficits and impairments:  Abnormal gait, Decreased balance, Decreased range of motion, Decreased strength, Impaired UE functional use, Impaired tone, Impaired sensation  Visit Diagnosis: Spastic hemiplegia of right dominant side due to other cerebrovascular disease (HCC)  Abnormal posture  Other disturbances of skin sensation  Frontal lobe and executive function deficit  Stiffness of right shoulder, not elsewhere classified  Other abnormalities of gait and mobility    Problem List Patient Active Problem List   Diagnosis Date Noted  . Spastic hemiplegia and hemiparesis affecting dominant side ([place]) 09/13/2015  . Adhesive capsulitis of right shoulder 08/24/2015  . Symptomatic partial epilepsy with simple partial seizures ([place]) 08/21/2015  . Nontraumatic cortical hemorrhage of cerebral hemisphere ([place])   . Seizure ([place])   . Seizures ([place])   . Adjustment disorder with depressed mood   . Contracture of muscle ankle and foot 06/11/2015  . Hemiplga fol ntrm intcrbl hemor aff right dominant side ([place]) 06/06/2015  . Left-sided intracerebral hemorrhage ([place]) 06/04/2015  . Seizure disorder as sequela of cerebrovascular accident ([place])   . Seizure disorder ([place])  06/03/2015  . Sepsis ([place]) 06/03/2015  . UTI (urinary tract infection) 06/03/2015  . Hypotension 06/02/2015  . Right spastic hemiparesis ([place]) 05/28/2015  . 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 History of anxiety disorder 05/28/2015  . ICH (intracerebral hemorrhage) ([place])   . Seizure disorder, nonconvulsive, with status epilepticus ([place])   . Cerebral venous thrombosis of cortical vein   . Cytotoxic cerebral edema ([place])   . IVH (intraventricular hemorrhage) ([place])   . Term pregnancy 05/07/2015  . Spontaneous vaginal delivery 05/07/2015    Quay Burow, OTR/L 07/15/2016, 1:05 PM  [facility] [address]  [place], [place], [address] Phone: [phone]   Fax:  [phone]  Name: Ashley [name] MRN: [id number] Date of Birth: 03/04/1976

## 2016-07-17 ENCOUNTER — Ambulatory Visit: Payer: BLUE CROSS/BLUE SHIELD | Admitting: Occupational Therapy

## 2016-07-17 ENCOUNTER — Encounter: Payer: Self-pay | Admitting: Occupational Therapy

## 2016-07-17 ENCOUNTER — Ambulatory Visit: Payer: BLUE CROSS/BLUE SHIELD

## 2016-07-17 DIAGNOSIS — R293 Abnormal posture: Secondary | ICD-10-CM

## 2016-07-17 DIAGNOSIS — R208 Other disturbances of skin sensation: Secondary | ICD-10-CM

## 2016-07-17 DIAGNOSIS — G8111 Spastic hemiplegia affecting right dominant side: Secondary | ICD-10-CM

## 2016-07-17 DIAGNOSIS — R2689 Other abnormalities of gait and mobility: Secondary | ICD-10-CM

## 2016-07-17 DIAGNOSIS — R41844 Frontal lobe and executive function deficit: Secondary | ICD-10-CM

## 2016-07-17 DIAGNOSIS — I679 Cerebrovascular disease, unspecified: Principal | ICD-10-CM

## 2016-07-17 DIAGNOSIS — M25611 Stiffness of right shoulder, not elsewhere classified: Secondary | ICD-10-CM

## 2016-07-17 DIAGNOSIS — I69851 Hemiplegia and hemiparesis following other cerebrovascular disease affecting right dominant side: Secondary | ICD-10-CM | POA: Diagnosis not present

## 2016-07-17 NOTE — Therapy (Signed)
[facility] [address] [place], [place], [address] Phone: [phone]   Fax:  [phone]  Physical Therapy Treatment  Patient Details  Name: Ashley [name] MRN: [id number] Date of Birth: [birth year] No Data Recorded  Encounter Date: 07/17/2016      PT End of Session - 07/17/16 1253    Visit Number 78   Number of Visits 87   Date for PT Re-Evaluation 09/15/16  new POC 09/15/16   Authorization Type BCBS. Per Laverda Page, visits now 30 for PT and OT total (15 per OT and 15 per PT). Pt wishes to continue at self pay after visit limit met.    PT Start Time 1102   PT Stop Time 1149   PT Time Calculation (min) 47 min   Equipment Utilized During Treatment --  S prn   Activity Tolerance Patient tolerated treatment well   Behavior During Therapy WFL for tasks assessed/performed      Past Medical History:  Diagnosis Date  . Anxiety   . ICH (intracerebral hemorrhage) ([place])   . Seizures ([place])   . Stroke [facility])     Past Surgical History:  Procedure Laterality Date  . DILATION AND CURETTAGE OF UTERUS      There were no vitals filed for this visit.      Subjective Assessment - 07/17/16 1106    Subjective Pt reported she's no longer taking Trintellix but can't remember what medication replaced it. Pt has a consult for a split tendon surgery ([facility]) today for spastic foot drop, she's unsure if she wants it. Pt stated she is no longer having botox injections as she didn't see much difference.  Pt reports she can use the ellipictal for 20 minutes now.  Pt reports sensation is still decr. in RLE.   Pertinent History Seizures, low BP   Patient Stated Goals "I want to swim, walk better".   Currently in Pain? No/denies            [facility] PT Assessment - 07/17/16 1134      AROM   Overall AROM  Deficits   Overall AROM Comments R ankle DF AROM lacking approx. 15 degrees and PROM lacking approx. 3 degrees.      Strength    Overall Strength Deficits   Overall Strength Comments LLE WFL. RLE: hip flex: 3+/5, knee ext: 4+/5, knee flex: 4-/5, ankle DF: 2/5. Gross seated hip abd/add: 3+/5.     Functional Gait  Assessment   Gait assessed  Yes   Gait Level Surface Walks 20 ft in less than 7 sec but greater than 5.5 sec, uses assistive device, slower speed, mild gait deviations, or deviates 6-10 in outside of the 12 in walkway width.  6.6sec.   Change in Gait Speed Able to change speed, demonstrates mild gait deviations, deviates 6-10 in outside of the 12 in walkway width, or no gait deviations, unable to achieve a major change in velocity, or uses a change in velocity, or uses an assistive device.   Gait with Horizontal Head Turns Performs head turns smoothly with slight change in gait velocity (eg, minor disruption to smooth gait path), deviates 6-10 in outside 12 in walkway width, or uses an assistive device.   Gait with Vertical Head Turns Performs task with slight change in gait velocity (eg, minor disruption to smooth gait path), deviates 6 - 10 in outside 12 in walkway width or uses assistive device   Gait and Pivot Turn Pivot turns safely  within 3 sec and stops quickly with no loss of balance.   Step Over Obstacle Is able to step over 2 stacked shoe boxes taped together (9 in total height) without changing gait speed. No evidence of imbalance.   Gait with Narrow Base of Support Ambulates less than 4 steps heel to toe or cannot perform without assistance.   Gait with Eyes Closed Walks 20 ft, no assistive devices, good speed, no evidence of imbalance, normal gait pattern, deviates no more than 6 in outside 12 in walkway width. Ambulates 20 ft in less than 7 sec.   Ambulating Backwards Walks 20 ft, slow speed, abnormal gait pattern, evidence for imbalance, deviates 10-15 in outside 12 in walkway width.   Steps Alternating feet, must use rail.  rail to descend (just grabbed once)   Total Score 20                      [facility] Adult PT Treatment/Exercise - 07/17/16 1134      Ambulation/Gait   Ambulation/Gait Yes   Ambulation/Gait Assistance 6: Modified independent (Device/Increase time);5: Supervision   Ambulation/Gait Assistance Details MOD I over even terrain while amb. in straight trajectory. S during turns and amb. backwards and with eyes closed.   Ambulation Distance (Feet) 400 Feet   Assistive device None   Gait Pattern Decreased stance time - right;Decreased weight shift to right;Right foot flat;Decreased hip/knee flexion - right;Step-through pattern;Decreased step length - left;Trunk rotated posteriorly on right   Ambulation Surface Level;Indoor   Gait velocity 3.58f/sec. no AD           Self Care:     PT Education - 07/17/16 1250    Education provided Yes   Education Details PT discussed new POC and discussed what goals are important to pt. Pt stated she has a consult today for a tendon splitting procedure to decr. spastic foot drop. PT encouraged pt to ask surgeon about procedue, is it for lengthening tendon vs. improving R dorsiflexion and eversion. PT encouraged pt to ask about recovery time, non-weight bearing time/precuations, and PT requirements.   Person(s) Educated Patient   Methods Explanation   Comprehension Verbalized understanding          PT Short Term Goals - 07/17/16 1320      PT SHORT TERM GOAL #1   Title Pt will be IND in progressed HEP to improve strength, balance and gait deviations. TARGET DATE FOR ALL STGS: 08/14/16   Status New     PT SHORT TERM GOAL #2   Title Pt will improve FGA score to >/=24/30 to decr. falls risk.   Status New     PT SHORT TERM GOAL #3   Title Pt will trial running on treadmill and write goal if appropriate.   Status New     PT SHORT TERM GOAL #4   Title Pt will descend 12 steps in step through pattern, without holding rails at MOD I level (incr. time) to carry objects at home.    Status New     PT  SHORT TERM GOAL #5   Title --   Status --     PT SHORT TERM GOAL #6   Title --   Status --     PT SHORT TERM GOAL #7   Title --   Status --     PT SHORT TERM GOAL #8   Title --   Status --     PT SHORT TERM GOAL #9  TITLE --   Status --     PT SHORT TERM GOAL #10   TITLE --   Status --     PT SHORT TERM GOAL #11   TITLE --   Status --     PT SHORT TERM GOAL #12   TITLE --   Status --     PT SHORT TERM GOAL #13   TITLE --   Status --           PT Long Term Goals - 07/17/16 1323      PT LONG TERM GOAL #1   Title Pt will improve FGA score to >/=27/30 to decr. falls risk. TARGET DATE FOR ALL LTGS: 09/11/16   Status New     PT LONG TERM GOAL #2   Title Pt will report she is able to swim in a shallow pool for 5 minutes to improve strength, quality of life, and to play with kids.    Status New               Plan - 07/17/16 1254    Clinical Impression Statement PT performed re-eval today, as pt was placed on hold for 2 months. 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 Pt's FGA score indicates she is at a medium risk for falls. All tests performed without R AFO donned, as pt is not consistenly wearing AFO. PT requesting renewal for 1x/week for 8 weeks to improve balance, gait deviations and RLE strength.    Rehab Potential Good   Clinical Impairments Affecting Rehab Potential Seizures which may limit intensity of PT   PT Frequency 2x / week   PT Duration 8 weeks   PT Next Visit Plan Review HEP and progress as tolerated. Educate pt on JAS splint option for R ankle DF and potential referral to Dr. Oneida Alar regarding running with ant. AFO.   Consulted and Agree with Plan of Care Patient      Patient will benefit from skilled therapeutic intervention in order to improve the following deficits and impairments:  Abnormal gait, Decreased endurance, Impaired sensation, Decreased knowledge of precautions, Decreased activity tolerance, Decreased  knowledge of use of DME, Decreased strength, Impaired UE functional use, Impaired tone, Decreased balance, Decreased mobility, Decreased cognition, Decreased range of motion, Decreased safety awareness, Decreased coordination, Impaired flexibility, Postural dysfunction  Visit Diagnosis: Spastic hemiplegia of right dominant side due to cerebrovascular disease, unspecified cerebrovascular disease type ([place]) - Plan: PT plan of care cert/re-cert  Abnormal posture - Plan: PT plan of care cert/re-cert  Other disturbances of skin sensation - Plan: PT plan of care cert/re-cert  Other abnormalities of gait and mobility - Plan: PT plan of care cert/re-cert     Problem List Patient Active Problem List   Diagnosis Date Noted  . Spastic hemiplegia and hemiparesis affecting dominant side ([place]) 09/13/2015  . Adhesive capsulitis of right shoulder 08/24/2015  . Symptomatic partial epilepsy with simple partial seizures ([place]) 08/21/2015  . Nontraumatic cortical hemorrhage of cerebral hemisphere ([place])   . Seizure ([place] [place])   . Seizures ([place])   . Adjustment disorder with depressed mood   . Contracture of muscle ankle and foot 06/11/2015  . Hemiplga fol ntrm intcrbl hemor aff right dominant side ([place]) 06/06/2015  . Left-sided intracerebral hemorrhage ([place]) 06/04/2015  . Seizure disorder as sequela of cerebrovascular accident ([place])   . Seizure disorder ([place]) 06/03/2015  . Sepsis ([place]) 06/03/2015  . UTI (urinary tract infection) 06/03/2015  . Hypotension 06/02/2015  .  Right spastic hemiparesis ([place]) 05/28/2015  . 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 History of anxiety disorder 05/28/2015  . ICH (intracerebral hemorrhage) ([place])   . Seizure disorder, nonconvulsive, with status epilepticus ([place])   . Cerebral venous thrombosis of cortical vein   . Cytotoxic cerebral edema ([place])   . IVH (intraventricular hemorrhage) ([place])   . Term pregnancy 05/07/2015  . Spontaneous vaginal  delivery 05/07/2015    Kemonte Ullman L 07/17/2016, 1:28 PM  [facility] [address] [place], [place], [address] Phone: [phone]   Fax:  [phone]  Name: Ashley [name] MRN: [id number] Date of Birth: 07/29/1976   Geoffry Paradise, PT,DPT 07/17/16 1:29 PM Phone: [phone] Fax: [phone]

## 2016-07-17 NOTE — Therapy (Signed)
[facility] [address] [place], [place], [address] Phone: [phone]   Fax:  [phone]  Occupational Therapy Treatment  Patient Details  Name: Ashley [name] MRN: [id number] Date of Birth: August 19, 1976 No Data Recorded  Encounter Date: 07/17/2016      OT End of Session - 07/17/16 1212    Visit Number 66   Number of Visits 58   Date for OT Re-Evaluation 08/05/16   Authorization Type BCBS   Authorization Time Period 30 visit COMBINED between PT/OT;  pt has utilized benefit and is now self pay.    OT Start Time 1105   OT Stop Time 1146   OT Time Calculation (min) 41 min   Activity Tolerance Patient tolerated treatment well      Past Medical History:  Diagnosis Date  . Anxiety   . ICH (intracerebral hemorrhage) ([place])   . Seizures ([place])   . Stroke [facility])     Past Surgical History:  Procedure Laterality Date  . DILATION AND CURETTAGE OF UTERUS      There were no vitals filed for this visit.      Subjective Assessment - 07/17/16 1023    Subjective  I brought my exercises for you.   Pertinent History see epic snapshot; pt with SAH 2 weeks after deliverying her 3rd child   Patient Stated Goals to be able to use my R arm normally   Currently in Pain? No/denies                      OT Treatments/Exercises (OP) - 07/17/16 0001      Neurological Re-education Exercises   Other Exercises 1 Neuro re ed to address proximal scapular strength in sidelying with side planks as well in supine with weighed stretch into full external rotation with full abduction.  Pt requires vc's for alignment and alignment of L shoulder. Also addressed increasing strength demand for unilateral overhead reach.  In process of beginning to upgrade HEP.                   OT Short Term Goals - 07/17/16 1210      OT SHORT TERM GOAL  #11   TITLE Pt will be mod I with upgraded HEP - 01/08/2016   Status Achieved      OT SHORT TERM GOAL  #12   TITLE Pt will be able to pick up 8 pound object x 4 trials bilaterally to place on overhead shelf requiring at least 120* of shoulder flexion (for household mgmt tasks, grocery shopping ,etc)   Status Achieved     OT SHORT TERM GOAL  #13   TITLE Pt will be able to lift lightweight object unilaterally with RUE to 120* of shoulder flexion (baseline 115*)   Status Achieved  able to lift 4 pound object to this height with very minimal compensations     OT SHORT TERM GOAL  #14   TITLE Pt will be able to lift baby into and out of tub while in kneeling from and to baby seat using both UE's - 03/11/2016   Status Achieved  Pt sat on gma's lap and pt able to lift baby from gma's lap into tub and then lift baby out of tub and hand to gma            OT Long Term Goals - 07/17/16 1210      OT LONG TERM GOAL #1   Title Pt  will be mod I with upgraded HEP - 08/05/2016   Status On-going     OT LONG TERM GOAL #2   Title Pt will demonstrate ability to lift 10 pound object into and out of overhead shelf with RUE   Status On-going     OT LONG TERM GOAL #3   Title Pt will demonstrate 140* of overhead unilateral reach with RUE to assist with functional tasks.   Status Achieved     OT LONG TERM GOAL #4   Title Pt will demonstrate 135* of abduction in RUE to assist with functional tasks.    Status Achieved  140*               Plan - 07/17/16 1210    Clinical Impression Statement Pt is making excellent progress and is now able tolerate and control more advanced actitivies for trunk and RUE unilateral overhead control.   Rehab Potential Good   Clinical Impairments Affecting Rehab Potential cognition, impaired sensation and anxiety   OT Frequency 2x / week   OT Duration 4 weeks   OT Treatment/Interventions Self-care/ADL training;Therapeutic exercise;Patient/family education;Balance training;Ultrasound;Neuromuscular education;Manual Therapy;Splinting;Therapeutic  exercises;Energy conservation;Parrafin;DME and/or AE instruction;Therapeutic activities;Cognitive remediation/compensation;Gait Training;Fluidtherapy;Electrical Stimulation;Moist Heat;Contrast Bath;Passive range of motion;Visual/perceptual remediation/compensation   Plan check lifting 10 pounds overehead, complete upgrade of HEP   Consulted and Agree with Plan of Care Patient      Patient will benefit from skilled therapeutic intervention in order to improve the following deficits and impairments:  Abnormal gait, Decreased balance, Decreased range of motion, Decreased strength, Impaired UE functional use, Impaired tone, Impaired sensation  Visit Diagnosis: Spastic hemiplegia of right dominant side due to cerebrovascular disease, unspecified cerebrovascular disease type (HCC)  Abnormal posture  Other disturbances of skin sensation  Frontal lobe and executive function deficit  Stiffness of right shoulder, not elsewhere classified    Problem List Patient Active Problem List   Diagnosis Date Noted  . Spastic hemiplegia and hemiparesis affecting dominant side ([place]) 09/13/2015  . Adhesive capsulitis of right shoulder 08/24/2015  . Symptomatic partial epilepsy with simple partial seizures ([place]) 08/21/2015  . Nontraumatic cortical hemorrhage of cerebral hemisphere ([place])   . Seizure ([place])   . Seizures ([place])   . Adjustment disorder with depressed mood   . Contracture of muscle ankle and foot 06/11/2015  . Hemiplga fol ntrm intcrbl hemor aff right dominant side ([place]) 06/06/2015  . Left-sided intracerebral hemorrhage ([place]) 06/04/2015  . Seizure disorder as sequela of cerebrovascular accident ([place])   . Seizure disorder ([place]) 06/03/2015  . Sepsis ([place]) 06/03/2015  . UTI (urinary tract infection) 06/03/2015  . Hypotension 06/02/2015  . Right spastic hemiparesis ([place]) 05/28/2015  . 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 History of anxiety disorder 05/28/2015   . ICH (intracerebral hemorrhage) ([place])   . Seizure disorder, nonconvulsive, with status epilepticus ([place])   . Cerebral venous thrombosis of cortical vein   . Cytotoxic cerebral edema ([place])   . IVH (intraventricular hemorrhage) ([place])   . Term pregnancy 05/07/2015  . Spontaneous vaginal delivery 05/07/2015    Quay Burow, OTR/L 07/17/2016, 12:14 PM  [facility] [address] [place], [place], [address] Phone: [phone]   Fax:  [phone]  Name: Ashley [name] MRN: [id number] Date of Birth: 1975-12-02

## 2016-07-22 ENCOUNTER — Encounter: Payer: BLUE CROSS/BLUE SHIELD | Admitting: Occupational Therapy

## 2016-07-22 ENCOUNTER — Ambulatory Visit: Payer: BLUE CROSS/BLUE SHIELD

## 2016-07-24 ENCOUNTER — Encounter: Payer: BLUE CROSS/BLUE SHIELD | Admitting: Occupational Therapy

## 2016-07-24 ENCOUNTER — Ambulatory Visit: Payer: BLUE CROSS/BLUE SHIELD

## 2016-07-24 DIAGNOSIS — R2689 Other abnormalities of gait and mobility: Secondary | ICD-10-CM

## 2016-07-24 DIAGNOSIS — G8111 Spastic hemiplegia affecting right dominant side: Secondary | ICD-10-CM

## 2016-07-24 DIAGNOSIS — I679 Cerebrovascular disease, unspecified: Principal | ICD-10-CM

## 2016-07-24 DIAGNOSIS — I69851 Hemiplegia and hemiparesis following other cerebrovascular disease affecting right dominant side: Secondary | ICD-10-CM | POA: Diagnosis not present

## 2016-07-24 NOTE — Patient Instructions (Signed)
Dr. Stefanie Libel with [facility] Blue rocker and toe off AFOs for running.

## 2016-07-24 NOTE — Therapy (Signed)
[facility] [address] [place], [place], [address] Phone: [phone]   Fax:  [phone]  Physical Therapy Treatment  Patient Details  Name: Ashley [name] MRN: [id number] Date of Birth: 18-Apr-1976 No Data Recorded  Encounter Date: 07/24/2016      PT End of Session - 07/24/16 1216    Visit Number 80   Number of Visits 28   Date for PT Re-Evaluation 09/15/16   Authorization Type BCBS. Per Laverda Page, visits now 30 for PT and OT total (15 per OT and 15 per PT). Pt wishes to continue at self pay after visit limit met.    PT Start Time 1104   PT Stop Time 1149   PT Time Calculation (min) 45 min   Equipment Utilized During Treatment --  min guard to S prn   Activity Tolerance Patient tolerated treatment well   Behavior During Therapy WFL for tasks assessed/performed      Past Medical History:  Diagnosis Date  . Anxiety   . ICH (intracerebral hemorrhage) ([place])   . Seizures ([place])   . Stroke [facility])     Past Surgical History:  Procedure Laterality Date  . DILATION AND CURETTAGE OF UTERUS      There were no vitals filed for this visit.      Subjective Assessment - 07/24/16 1109    Subjective Pt denied falls since last visit. Pt stated she doesn't like how the [place] makes her feel and she's going to let the MD know. Pt saw the surgeon at [facility] last week, and he said he could perform a SPLATT surgery to improve R foot drop. Pt reported she was told she would be casted for 6 weeks and then in a boot for 3-6 additional weeks. Pt is not sure how long she'd be non-weight bearing. Pt    Pertinent History Seizures, low BP   Patient Stated Goals "I want to swim, walk better".   Currently in Pain? No/denies                         [facility] Adult PT Treatment/Exercise - 07/24/16 1220      Ambulation/Gait   Ambulation/Gait Yes   Ambulation/Gait Assistance 5: Supervision;4: Min guard   Ambulation/Gait  Assistance Details With R toe off AFO donned, pt attempting fast amb. and jogging with cues for technique and demo for technique (improved heel strike and ant. wt. shifting during stance phase).  Pt also amb. on treadmill for 5 minutes at 2.90mh with cues to improve ant. wt. shifting.   Ambulation Distance (Feet) 1000 Feet, plus 5 minutes on treadmill   Assistive device None   Gait Pattern Decreased stance time - right;Decreased weight shift to right;Right foot flat;Decreased hip/knee flexion - right;Step-through pattern;Decreased step length - left;Trunk rotated posteriorly on right   Ambulation Surface Level;Unlevel;Indoor;Outdoor;Paved           Self Care:     PT Education - 07/24/16 1208    Education provided Yes   Education Details PT educated pt on additional options for attempting to run again, if she decides against the SPLATT surgery. PT educated pt on anterior AFOs (blue rocker and toe-off) to use during running and educated pt on JAS splint to improve R ankle DF ROM (3x 30 minutes per day). PT educated pt she would likely be non-weight bearing after surgery and require assistance for caring for children. PT provided pt with Dr. KStefanie Libelname  and information, as he works at [employer] and has assisted in finding the appropriate AFO for returning to running.    Person(s) Educated Patient   Methods Explanation   Comprehension Verbalized understanding          PT Short Term Goals - 07/24/16 1219      PT SHORT TERM GOAL #1   Title Pt will be IND in progressed HEP to improve strength, balance and gait deviations. TARGET DATE FOR ALL STGS: 08/14/16   Status On-going     PT SHORT TERM GOAL #2   Title Pt will improve FGA score to >/=24/30 to decr. falls risk.   Status On-going     PT SHORT TERM GOAL #3   Title Pt will trial running on treadmill and write goal if appropriate.   Status On-going     PT SHORT TERM GOAL #4   Title Pt will descend 12 steps in step  through pattern, without holding rails at MOD I level (incr. time) to carry objects at home.    Status On-going           PT Long Term Goals - 07/24/16 1219      PT LONG TERM GOAL #1   Title Pt will improve FGA score to >/=27/30 to decr. falls risk. TARGET DATE FOR ALL LTGS: 09/11/16   Status On-going     PT LONG TERM GOAL #2   Title Pt will report she is able to swim in a shallow pool for 5 minutes to improve strength, quality of life, and to play with kids.    Status On-going               Plan - 07/24/16 1216    Clinical Impression Statement Pt attempted jogging/fast walking with R toe-off AFO donned to allow incr. flexion moment at knee/ankle. Pt reluctant to attempt jogging due to a fear of falling, therefore, PT will attempt jogging with BWSTT system next session, after reviewing pt's HEP. Pt continues to experience difficulty with ant. wt. shifting during mid and terminal stance. Continue with POC.    Rehab Potential Good   Clinical Impairments Affecting Rehab Potential Seizures which may limit intensity of PT   PT Frequency 1x / week   PT Duration 8 weeks   PT Treatment/Interventions ADLs/Self Care Home Management;Neuromuscular re-education;Cognitive remediation;Biofeedback;DME Instruction;Gait training;Stair training;Canalith Repostioning;Patient/family education;Orthotic Fit/Training;Balance training;Therapeutic exercise;Manual techniques;Therapeutic activities;Vestibular   PT Next Visit Plan Review HEP and progress as tolerated. BWSTT jogging.   PT Home Exercise Plan Stretching/strength/balance HEP   Consulted and Agree with Plan of Care Patient      Patient will benefit from skilled therapeutic intervention in order to improve the following deficits and impairments:  Abnormal gait, Decreased endurance, Impaired sensation, Decreased knowledge of precautions, Decreased activity tolerance, Decreased knowledge of use of DME, Decreased strength, Impaired UE functional  use, Impaired tone, Decreased balance, Decreased mobility, Decreased cognition, Decreased range of motion, Decreased safety awareness, Decreased coordination, Impaired flexibility, Postural dysfunction  Visit Diagnosis: Spastic hemiplegia of right dominant side due to cerebrovascular disease, unspecified cerebrovascular disease type ([place])  Other abnormalities of gait and mobility     Problem List Patient Active Problem List   Diagnosis Date Noted  . Spastic hemiplegia and hemiparesis affecting dominant side ([place]) 09/13/2015  . Adhesive capsulitis of right shoulder 08/24/2015  . Symptomatic partial epilepsy with simple partial seizures ([place]) 08/21/2015  . Nontraumatic cortical hemorrhage of cerebral hemisphere ([place])   . Seizure ([place])   .  Seizures ([place])   . Adjustment disorder with depressed mood   . Contracture of muscle ankle and foot 06/11/2015  . Hemiplga fol ntrm intcrbl hemor aff right dominant side ([place]) 06/06/2015  . Left-sided intracerebral hemorrhage ([place]) 06/04/2015  . Seizure disorder as sequela of cerebrovascular accident ([place])   . Seizure disorder ([place]) 06/03/2015  . Sepsis ([place]) 06/03/2015  . UTI (urinary tract infection) 06/03/2015  . Hypotension 06/02/2015  . Right spastic hemiparesis ([place]) 05/28/2015  . 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 History of anxiety disorder 05/28/2015  . ICH (intracerebral hemorrhage) ([place])   . Seizure disorder, nonconvulsive, with status epilepticus ([place])   . Cerebral venous thrombosis of cortical vein   . Cytotoxic cerebral edema ([place])   . IVH (intraventricular hemorrhage) ([place])   . Term pregnancy 05/07/2015  . Spontaneous vaginal delivery 05/07/2015    , L 07/24/2016, 12:22 PM  [facility] [address] [place], [place], [address] Phone: [phone]   Fax:  [phone]  Name: Ashley [name] MRN: [id number] Date of  Birth: 1976/01/03   Geoffry Paradise, PT,DPT 07/24/16 12:23 PM Phone: [phone] Fax: [phone]

## 2016-07-28 ENCOUNTER — Encounter: Payer: Self-pay | Admitting: Occupational Therapy

## 2016-07-28 ENCOUNTER — Ambulatory Visit: Payer: BLUE CROSS/BLUE SHIELD | Admitting: Occupational Therapy

## 2016-07-28 DIAGNOSIS — R293 Abnormal posture: Secondary | ICD-10-CM

## 2016-07-28 DIAGNOSIS — G8111 Spastic hemiplegia affecting right dominant side: Secondary | ICD-10-CM

## 2016-07-28 DIAGNOSIS — I69851 Hemiplegia and hemiparesis following other cerebrovascular disease affecting right dominant side: Secondary | ICD-10-CM | POA: Diagnosis not present

## 2016-07-28 DIAGNOSIS — I679 Cerebrovascular disease, unspecified: Principal | ICD-10-CM

## 2016-07-28 DIAGNOSIS — R2689 Other abnormalities of gait and mobility: Secondary | ICD-10-CM

## 2016-07-28 DIAGNOSIS — R208 Other disturbances of skin sensation: Secondary | ICD-10-CM

## 2016-07-28 NOTE — Patient Instructions (Addendum)
Home Program for your arm: Updated 07/28/2016. When laying down use a noodle along your spine for increased stretch. Cut the noodle in half length wise and place the flat surface face down.   1.  Planks: Make sure your elbows are directly under your shoulders. Push into plank and hold for 10 seconds.  Do 10-12.  Ad this gets easier you can increase your HOLD TIME.  DO NOT INCREASE REPS.  Add hold time in increments of 3-5 seconds.  2.  Sit on a tall surface.  Hold a 5 pound weight in both hand, palms facing each other.  Keeping elbows straight, raise above your head. Do this in front of a mirror and make sure you stay on your right hip, lift your chest and don't hike your shoulder.  THINK LONG ARMS!!  Do 15, rest then do 15 more.   3. Overhead reach:  Place a noodle along your spine and lay on your back. Use 3 pound weights in each hand (instead of one 5 pound weight).  Do 15, rest then do 15 more. KEEP ELBOWS STRAIGHT and control the movement.   4.  Stand beside the wall with your right arm on the wall, elbow straight with the back of your hand against the wall (think of a clock and your are the clock!!).  Place left foot on a yoga block. Put your  Hand at 7:00 - reach to the right slightly. LIFT YOUR CHEST AND DON'T HIKE YOUR SHOULDER.   Keeping elbow straight push in to the wall with the back of your hand and hold for a slow count of 5.  Do 5. Repeat at 9:00, do 5.  Repeat at 10:00, do 5.  This should not cause pain and don't over push.  Stretches: 1.  Lay on the floor with your noodle under your spine, no pillow, hands by your side with palms facing UP.  Simply relax and allow this stretch for 5 minutes.  Set a timer so you know you are stretching long enough.  Listen to music!!  2.  Lay on the floor without a pillow and noodle under your spine. .  Put hands behind your head, elbows bent and pointing toward the ceiling.  Keep your hands behind your head and slowly lower your elbows toward the floor.   HOLD FOR A SLOW COUNT OF 10.  Return to starting position.  Do 10, rest then do 10 more.    3. Lay on the floor with your noodle under your spine.  Position your right arm with your elbow bent at 90* with palm facing up toward the ceiling. Place a pillow under your arm from elbow to hand.  SLOWLY try and lower your hand down toward the pillow, DO NOT HIKE YOUR SHOULDER.  This should result in a stretch just under your shoulder blade. Hold for a slow count of 10.  Relax.  Do this 5 times, no more. This should not be painful.  4.  Stand with the wall to your right with your right palm on the wall.  Stand far enough away that your elbow can be straight.  Keep hand slightly lower than your shoulder.  Place your left foot on a yoga block angled away from your body.  Anchor your stability through your right foot, right hip and right shoulder.  KEEP YOUR SHOULDER DOWN.  Do this in front of a mirror if you can.  KEEPING YOUR WEIGHT ON YOUR RIGHT SIDE, slowly turn  entire body slowly  to the left.  This should result in a stretch across the front of your shoulder.  Hold for slow count of 10.

## 2016-07-29 ENCOUNTER — Encounter: Payer: BLUE CROSS/BLUE SHIELD | Admitting: Occupational Therapy

## 2016-07-29 ENCOUNTER — Ambulatory Visit: Payer: BLUE CROSS/BLUE SHIELD

## 2016-07-29 NOTE — Therapy (Signed)
[facility] [address] [place], [place], [address] Phone: [phone]   Fax:  [phone]  Occupational Therapy Treatment  Patient Details  Name: Ashley [name] MRN: [id number] Date of Birth: 1976/05/28 No Data Recorded  Encounter Date: 07/28/2016      OT End of Session - 07/29/16 0846    Visit Number 43   Number of Visits 49   Date for OT Re-Evaluation 08/05/16   Authorization Type BCBS   OT Start Time 1103   OT Stop Time 1145   OT Time Calculation (min) 42 min   Activity Tolerance Patient tolerated treatment well      Past Medical History:  Diagnosis Date  . Anxiety   . ICH (intracerebral hemorrhage) ([place])   . Seizures ([place])   . Stroke [facility])     Past Surgical History:  Procedure Laterality Date  . DILATION AND CURETTAGE OF UTERUS      There were no vitals filed for this visit.      Subjective Assessment - 07/28/16 1107    Subjective  (P)  I brought my exercises for you.   Pertinent History (P)  see epic snapshot; pt with SAH 2 weeks after deliverying her 3rd child   Patient Stated Goals (P)  to be able to use my R arm normally   Currently in Pain? (P)  No/denies           Treatment: upgraded HEP.  See pt instruction for details. Pt able to return demonstrate all activities.                    OT Education - 07/29/16 0844    Education provided Yes   Education Details upgraded HEP for RUE   Person(s) Educated Patient   Methods Explanation;Demonstration;Handout;Tactile cues;Verbal cues   Comprehension Verbalized understanding;Returned demonstration          OT Short Term Goals - 07/29/16 0844      OT SHORT TERM GOAL  #11   TITLE Pt will be mod I with upgraded HEP - 01/08/2016   Status Achieved     OT SHORT TERM GOAL  #12   TITLE Pt will be able to pick up 8 pound object x 4 trials bilaterally to place on overhead shelf requiring at least 120* of shoulder flexion  (for household mgmt tasks, grocery shopping ,etc)   Status Achieved     OT SHORT TERM GOAL  #13   TITLE Pt will be able to lift lightweight object unilaterally with RUE to 120* of shoulder flexion (baseline 115*)   Status Achieved  able to lift 4 pound object to this height with very minimal compensations     OT SHORT TERM GOAL  #14   TITLE Pt will be able to lift baby into and out of tub while in kneeling from and to baby seat using both UE's - 03/11/2016   Status Achieved  Pt sat on gma's lap and pt able to lift baby from gma's lap into tub and then lift baby out of tub and hand to [name] Term Goals - 07/29/16 0844      OT LONG TERM GOAL #1   Title Pt will be mod I with upgraded HEP - 08/05/2016   Status Achieved     OT LONG TERM GOAL #2   Title Pt will demonstrate ability to lift 10 pound object into and  out of overhead shelf with RUE   Status On-going     OT LONG TERM GOAL #3   Title Pt will demonstrate 140* of overhead unilateral reach with RUE to assist with functional tasks.   Status Achieved     OT LONG TERM GOAL #4   Title Pt will demonstrate 135* of abduction in RUE to assist with functional tasks.    Status Achieved  140*               Plan - 07/29/16 0845    Clinical Impression Statement Pt continues to make progress with RUE ROM and strength.  Upgraded HEP for RUE.   Rehab Potential Good   Clinical Impairments Affecting Rehab Potential cognition, impaired sensation and anxiety   OT Frequency 2x / week   OT Duration 4 weeks   OT Treatment/Interventions Self-care/ADL training;Therapeutic exercise;Patient/family education;Balance training;Ultrasound;Neuromuscular education;Manual Therapy;Splinting;Therapeutic exercises;Energy conservation;Parrafin;DME and/or AE instruction;Therapeutic activities;Cognitive remediation/compensation;Gait Training;Fluidtherapy;Electrical Stimulation;Moist Heat;Contrast Bath;Passive range of  motion;Visual/perceptual remediation/compensation   Plan place on hold at pt's request   Consulted and Agree with Plan of Care Patient      Patient will benefit from skilled therapeutic intervention in order to improve the following deficits and impairments:  Abnormal gait, Decreased balance, Decreased range of motion, Decreased strength, Impaired UE functional use, Impaired tone, Impaired sensation  Visit Diagnosis: Spastic hemiplegia of right dominant side due to cerebrovascular disease, unspecified cerebrovascular disease type ([place])  Other abnormalities of gait and mobility  Abnormal posture  Other disturbances of skin sensation    Problem List Patient Active Problem List   Diagnosis Date Noted  . Spastic hemiplegia and hemiparesis affecting dominant side ([place]) 09/13/2015  . Adhesive capsulitis of right shoulder 08/24/2015  . Symptomatic partial epilepsy with simple partial seizures ([place]) 08/21/2015  . Nontraumatic cortical hemorrhage of cerebral hemisphere ([place])   . Seizure ([place])   . Seizures ([place])   . Adjustment disorder with depressed mood   . Contracture of muscle ankle and foot 06/11/2015  . Hemiplga fol ntrm intcrbl hemor aff right dominant side ([place]) 06/06/2015  . Left-sided intracerebral hemorrhage ([place]) 06/04/2015  . Seizure disorder as sequela of cerebrovascular accident ([place])   . Seizure disorder ([place]) 06/03/2015  . Sepsis ([place]) 06/03/2015  . UTI (urinary tract infection) 06/03/2015  . Hypotension 06/02/2015  . Right spastic hemiparesis ([place]) 05/28/2015  . 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 History of anxiety disorder 05/28/2015  . ICH (intracerebral hemorrhage) ([place])   . Seizure disorder, nonconvulsive, with status epilepticus ([place])   . Cerebral venous thrombosis of cortical vein   . Cytotoxic cerebral edema ([place])   . IVH (intraventricular hemorrhage) ([place])   . Term pregnancy 05/07/2015  . Spontaneous vaginal delivery  05/07/2015    Quay Burow, OTR/L 07/29/2016, 8:47 AM  [facility] [address] [place], [place], [address] Phone: [phone]   Fax:  [phone]  Name: Ashley [name] MRN: [id number] Date of Birth: 13-Aug-1976

## 2016-07-31 ENCOUNTER — Encounter: Payer: BLUE CROSS/BLUE SHIELD | Admitting: Occupational Therapy

## 2016-07-31 ENCOUNTER — Ambulatory Visit: Payer: BLUE CROSS/BLUE SHIELD

## 2016-07-31 DIAGNOSIS — R2689 Other abnormalities of gait and mobility: Secondary | ICD-10-CM

## 2016-07-31 DIAGNOSIS — I69851 Hemiplegia and hemiparesis following other cerebrovascular disease affecting right dominant side: Secondary | ICD-10-CM | POA: Diagnosis not present

## 2016-07-31 DIAGNOSIS — G8111 Spastic hemiplegia affecting right dominant side: Secondary | ICD-10-CM

## 2016-07-31 DIAGNOSIS — I679 Cerebrovascular disease, unspecified: Principal | ICD-10-CM

## 2016-07-31 NOTE — Therapy (Signed)
[facility] [address] [place], [place], [address] Phone: [phone]   Fax:  [phone]  Physical Therapy Treatment  Patient Details  Name: Ashley [name] MRN: [id number] Date of Birth: 1975/12/08 No Data Recorded  Encounter Date: 07/31/2016      PT End of Session - 07/31/16 1158    Visit Number 81   Number of Visits 81   Date for PT Re-Evaluation 09/15/16   Authorization Type BCBS. Per Laverda Page, visits now 30 for PT and OT total (15 per OT and 15 per PT). Pt wishes to continue at self pay after visit limit met.    Authorization - Visit Number 27   Authorization - Number of Visits 16   PT Start Time 1102   PT Stop Time 1147   PT Time Calculation (min) 45 min   Equipment Utilized During Treatment --  S prn   Activity Tolerance Patient tolerated treatment well   Behavior During Therapy WFL for tasks assessed/performed      Past Medical History:  Diagnosis Date  . Anxiety   . ICH (intracerebral hemorrhage) ([place])   . Seizures ([place])   . Stroke [facility])     Past Surgical History:  Procedure Laterality Date  . DILATION AND CURETTAGE OF UTERUS      There were no vitals filed for this visit.      Subjective Assessment - 07/31/16 1104    Subjective Pt denied falls since last visit. Pt reported she did 20 minutes on the elliptical prior to PT session and feels good. Pt reported MD changed medication from [place] to Prozac.    Pertinent History Seizures, low BP   Patient Stated Goals "I want to swim, walk better".   Currently in Pain? No/denies           Therex: Pt reviewed and performed progressed HEP, please see pt instructions for therex not listed below as PT printed off new HEP as needed. Cues for technique. -B lunges x10 reps/LE with UE support. -B SLS squats x10/LE with UE support. All UE support intermittent.                      PT Education - 07/31/16 1157    Education  provided Yes   Education Details PT progressed and reviewed part of pt's HEP, and printed updates prn.   Person(s) Educated Patient   Methods Explanation;Demonstration;Tactile cues;Verbal cues;Handout   Comprehension Returned demonstration;Verbalized understanding          PT Short Term Goals - 07/24/16 1219      PT SHORT TERM GOAL #1   Title Pt will be IND in progressed HEP to improve strength, balance and gait deviations. TARGET DATE FOR ALL STGS: 08/14/16   Status On-going     PT SHORT TERM GOAL #2   Title Pt will improve FGA score to >/=24/30 to decr. falls risk.   Status On-going     PT SHORT TERM GOAL #3   Title Pt will trial running on treadmill and write goal if appropriate.   Status On-going     PT SHORT TERM GOAL #4   Title Pt will descend 12 steps in step through pattern, without holding rails at MOD I level (incr. time) to carry objects at home.    Status On-going           PT Long Term Goals - 07/24/16 1219      PT LONG TERM GOAL #1  Title Pt will improve FGA score to >/=27/30 to decr. falls risk. TARGET DATE FOR ALL LTGS: 09/11/16   Status On-going     PT LONG TERM GOAL #2   Title Pt will report she is able to swim in a shallow pool for 5 minutes to improve strength, quality of life, and to play with kids.    Status On-going               Plan - 07/31/16 1159    Clinical Impression Statement Pt demonstrated improved strength, as she was able to tolerate progression of strengthening HEP. PT was able to palpate a trace contraction of peroneus longus/brevis during R ankle eversion (1/5 MMT). Continue with POC.   Rehab Potential Good   Clinical Impairments Affecting Rehab Potential Seizures which may limit intensity of PT   PT Frequency 1x / week   PT Duration 8 weeks   PT Treatment/Interventions ADLs/Self Care Home Management;Neuromuscular re-education;Cognitive remediation;Biofeedback;DME Instruction;Gait training;Stair training;Canalith  Repostioning;Patient/family education;Orthotic Fit/Training;Balance training;Therapeutic exercise;Manual techniques;Therapeutic activities;Vestibular   PT Next Visit Plan Finish review HEP and progress as tolerated. Trial blue rocker (RLE) during BWSTT jogging. Chris from [place] will be at next session.   PT Home Exercise Plan Stretching/strength/balance HEP   Consulted and Agree with Plan of Care Patient      Patient will benefit from skilled therapeutic intervention in order to improve the following deficits and impairments:  Abnormal gait, Decreased endurance, Impaired sensation, Decreased knowledge of precautions, Decreased activity tolerance, Decreased knowledge of use of DME, Decreased strength, Impaired UE functional use, Impaired tone, Decreased balance, Decreased mobility, Decreased cognition, Decreased range of motion, Decreased safety awareness, Decreased coordination, Impaired flexibility, Postural dysfunction  Visit Diagnosis: Spastic hemiplegia of right dominant side due to cerebrovascular disease, unspecified cerebrovascular disease type ([place])  Other abnormalities of gait and mobility     Problem List Patient Active Problem List   Diagnosis Date Noted  . Spastic hemiplegia and hemiparesis affecting dominant side ([place]) 09/13/2015  . Adhesive capsulitis of right shoulder 08/24/2015  . Symptomatic partial epilepsy with simple partial seizures ([place]) 08/21/2015  . Nontraumatic cortical hemorrhage of cerebral hemisphere ([place])   . Seizure ([place])   . Seizures ([place])   . Adjustment disorder with depressed mood   . Contracture of muscle ankle and foot 06/11/2015  . Hemiplga fol ntrm intcrbl hemor aff right dominant side ([place]) 06/06/2015  . Left-sided intracerebral hemorrhage ([place]) 06/04/2015  . Seizure disorder as sequela of cerebrovascular accident ([place])   . Seizure disorder ([place]) 06/03/2015  . Sepsis ([place]) 06/03/2015  . UTI (urinary tract infection) 06/03/2015  . Hypotension  06/02/2015  . Right spastic hemiparesis ([place]) 05/28/2015  . 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 History of anxiety disorder 05/28/2015  . ICH (intracerebral hemorrhage) ([place])   . Seizure disorder, nonconvulsive, with status epilepticus ([place])   . Cerebral venous thrombosis of cortical vein   . Cytotoxic cerebral edema ([place])   . IVH (intraventricular hemorrhage) ([place])   . Term pregnancy 05/07/2015  . Spontaneous vaginal delivery 05/07/2015    Roselee Tayloe L 07/31/2016, 12:03 PM  [facility] [address] [place], [place], [address] Phone: [phone]   Fax:  [phone]  Name: Ida Milbrath MRN: [id number] Date of Birth: [birth year]   Geoffry Paradise, PT,DPT 07/31/16 12:05 PM Phone: [phone] Fax: [phone]

## 2016-07-31 NOTE — Patient Instructions (Signed)
ANKLE: Dorsiflexion (Band)    Sit at edge of surface. Place red  band around top of right foot. Keeping heel on floor, raise toes of banded foot. Hold _1-2__ seconds. Use ___red_____ band. _10__ reps per set, __1_ sets per day, _7__ days per week. Then performing every other day: place red band around top of right foot. Keeping heel on floor, raise toes of banded foot. Hold _5__ seconds. Relax, then perform 5 total reps.  Copyright  VHI. All rights reserved.   ANKLE: Eversion, Bilateral    Sit at edge of surface, feet on floor. Raise right toes only and move them away from body. Do not move hips or knees. _10__ reps per set, __3_ sets per day, __7_ days per week  Copyright  VHI. All rights reserved.   Wall Squat      Feet shoulder width apart, __12-18__ inches in front of wall, lean against wall. Heels on floor, knees parallel, bend hips and knees to 90. Hold __20__ seconds. Lower slowly, explode on return. Repeat _5___ times. Do __3-4__ sessions per week.  Copyright  VHI. All rights reserved.   Squat with sidestep:   Place red band above knees. With hands on counter as needed, perform mini squat and then sidestep 5 times to the Right side and then sidestep 5 times to the Left side. Repeat sequence. Perform 3-4 times per week.

## 2016-08-05 ENCOUNTER — Ambulatory Visit: Payer: BLUE CROSS/BLUE SHIELD

## 2016-08-07 ENCOUNTER — Ambulatory Visit: Payer: BLUE CROSS/BLUE SHIELD

## 2016-08-07 DIAGNOSIS — G8111 Spastic hemiplegia affecting right dominant side: Secondary | ICD-10-CM

## 2016-08-07 DIAGNOSIS — I69851 Hemiplegia and hemiparesis following other cerebrovascular disease affecting right dominant side: Secondary | ICD-10-CM | POA: Diagnosis not present

## 2016-08-07 DIAGNOSIS — I679 Cerebrovascular disease, unspecified: Secondary | ICD-10-CM

## 2016-08-07 DIAGNOSIS — R2689 Other abnormalities of gait and mobility: Secondary | ICD-10-CM

## 2016-08-07 NOTE — Therapy (Signed)
[facility] [address] [place], [place], [address] Phone: [phone]   Fax:  [phone]  Physical Therapy Treatment  Patient Details  Name: Ashley [name] MRN: [id number] Date of Birth: 01/10/1976 No Data Recorded  Encounter Date: 08/07/2016      PT End of Session - 08/07/16 1214    Visit Number 82   Number of Visits 6   Date for PT Re-Evaluation 09/15/16   Authorization Type BCBS. Per Laverda Page, visits now 30 for PT and OT total (15 per OT and 15 per PT). Pt wishes to continue at self pay after visit limit met.    Authorization - Visit Number 41   Authorization - Number of Visits 16   PT Start Time 1017   PT Stop Time 1058   PT Time Calculation (min) 41 min   Equipment Utilized During Treatment --  BWSTT harness   Activity Tolerance Patient tolerated treatment well   Behavior During Therapy WFL for tasks assessed/performed      Past Medical History:  Diagnosis Date  . Anxiety   . ICH (intracerebral hemorrhage) ([place])   . Seizures ([place])   . Stroke [facility])     Past Surgical History:  Procedure Laterality Date  . DILATION AND CURETTAGE OF UTERUS      There were no vitals filed for this visit.      Subjective Assessment - 08/07/16 1021    Subjective Pt denied falls or changes since last visit. Pt decided against surgery for now.    Pertinent History Seizures, low BP   Patient Stated Goals "I want to swim, walk better".   Currently in Pain? No/denies                         [facility] Adult PT Treatment/Exercise - 08/07/16 1022      Ambulation/Gait   Ambulation/Gait Yes   Ambulation/Gait Assistance 5: Supervision;4: Min guard;Other (comment)  BWSTT harness   Ambulation/Gait Assistance Details BWSTT fast amb./jogging and jogging over even terrain with 1/4" R heel wedge and R blue rocker. Cues to improve R hip flexion and knee flexion.   Ambulation Distance (Feet) 50 Feet  x10 and 0.5  miles on treadmill   Assistive device Body weight support system;None   Gait Pattern Decreased stance time - right;Decreased weight shift to right;Right foot flat;Decreased hip/knee flexion - right;Step-through pattern;Decreased step length - left;Trunk rotated posteriorly on right   Ambulation Surface Level;Indoor                PT Education - 08/07/16 1213    Education provided Yes   Education Details Gerald Stabs from [employer] Community education officer) present during BWSTT, as he brought a Teacher, adult education AFO for pt to trial jogging. PT explained the benefits of using blue rocker to attempt jogging again.   Person(s) Educated Patient   Methods Explanation   Comprehension Verbalized understanding          PT Short Term Goals - 07/24/16 1219      PT SHORT TERM GOAL #1   Title Pt will be IND in progressed HEP to improve strength, balance and gait deviations. TARGET DATE FOR ALL STGS: 08/14/16   Status On-going     PT SHORT TERM GOAL #2   Title Pt will improve FGA score to >/=24/30 to decr. falls risk.   Status On-going     PT SHORT TERM GOAL #3   Title Pt will trial running  on treadmill and write goal if appropriate.   Status On-going     PT SHORT TERM GOAL #4   Title Pt will descend 12 steps in step through pattern, without holding rails at MOD I level (incr. time) to carry objects at home.    Status On-going           PT Long Term Goals - 07/24/16 1219      PT LONG TERM GOAL #1   Title Pt will improve FGA score to >/=27/30 to decr. falls risk. TARGET DATE FOR ALL LTGS: 09/11/16   Status On-going     PT LONG TERM GOAL #2   Title Pt will report she is able to swim in a shallow pool for 5 minutes to improve strength, quality of life, and to play with kids.    Status On-going               Plan - 08/07/16 1215    Clinical Impression Statement Pt demonstrated progress, as she was able to amb. at faster pace and attempt jogging with R blue rocker and 1/4" heel wedge donned. Pt  continues to require cues to for technique, to improve arm swing, R step length, R hip flexion and R knee flexion. Continue with POC.    Rehab Potential Good   Clinical Impairments Affecting Rehab Potential Seizures which may limit intensity of PT   PT Frequency 1x / week   PT Duration 8 weeks   PT Treatment/Interventions ADLs/Self Care Home Management;Neuromuscular re-education;Cognitive remediation;Biofeedback;DME Instruction;Gait training;Stair training;Canalith Repostioning;Patient/family education;Orthotic Fit/Training;Balance training;Therapeutic exercise;Manual techniques;Therapeutic activities;Vestibular   PT Next Visit Plan Finish review HEP and progress as tolerated. Continue Trial blue rocker (RLE) during BWSTT jogging.    PT Home Exercise Plan Stretching/strength/balance HEP   Consulted and Agree with Plan of Care Patient      Patient will benefit from skilled therapeutic intervention in order to improve the following deficits and impairments:  Abnormal gait, Decreased endurance, Impaired sensation, Decreased knowledge of precautions, Decreased activity tolerance, Decreased knowledge of use of DME, Decreased strength, Impaired UE functional use, Impaired tone, Decreased balance, Decreased mobility, Decreased cognition, Decreased range of motion, Decreased safety awareness, Decreased coordination, Impaired flexibility, Postural dysfunction  Visit Diagnosis: Other abnormalities of gait and mobility  Spastic hemiplegia of right dominant side due to cerebrovascular disease, unspecified cerebrovascular disease type [facility])     Problem List Patient Active Problem List   Diagnosis Date Noted  . Spastic hemiplegia and hemiparesis affecting dominant side ([place]) 09/13/2015  . Adhesive capsulitis of right shoulder 08/24/2015  . Symptomatic partial epilepsy with simple partial seizures ([place]) 08/21/2015  . Nontraumatic cortical hemorrhage of cerebral hemisphere ([place])   . Seizure ([place])   .  Seizures ([place])   . Adjustment disorder with depressed mood   . Contracture of muscle ankle and foot 06/11/2015  . Hemiplga fol ntrm intcrbl hemor aff right dominant side ([place]) 06/06/2015  . Left-sided intracerebral hemorrhage ([place]) 06/04/2015  . Seizure disorder as sequela of cerebrovascular accident ([place])   . Seizure disorder ([place]) 06/03/2015  . Sepsis ([place]) 06/03/2015  . UTI (urinary tract infection) 06/03/2015  . Hypotension 06/02/2015  . Right spastic hemiparesis ([place]) 05/28/2015  . 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 History of anxiety disorder 05/28/2015  . ICH (intracerebral hemorrhage) ([place])   . Seizure disorder, nonconvulsive, with status epilepticus ([place])   . Cerebral venous thrombosis of cortical vein   . Cytotoxic cerebral edema ([place])   . IVH (intraventricular  hemorrhage) ([place])   . Term pregnancy 05/07/2015  . Spontaneous vaginal delivery 05/07/2015    Ashley [name] L 08/07/2016, 12:17 PM  [facility] [address] [place], [place], [address] Phone: [phone]   Fax:  [phone]  Name: Ashley [name] MRN: [id number] Date of Birth: 1976/06/25   Geoffry Paradise, PT,DPT 08/07/16 12:17 PM Phone: [phone] Fax: [phone]

## 2016-08-10 ENCOUNTER — Other Ambulatory Visit: Payer: Self-pay | Admitting: Neurology

## 2016-08-12 ENCOUNTER — Ambulatory Visit: Payer: BLUE CROSS/BLUE SHIELD

## 2016-08-12 DIAGNOSIS — I69851 Hemiplegia and hemiparesis following other cerebrovascular disease affecting right dominant side: Secondary | ICD-10-CM | POA: Diagnosis not present

## 2016-08-12 DIAGNOSIS — I679 Cerebrovascular disease, unspecified: Principal | ICD-10-CM

## 2016-08-12 DIAGNOSIS — R2689 Other abnormalities of gait and mobility: Secondary | ICD-10-CM

## 2016-08-12 DIAGNOSIS — G8111 Spastic hemiplegia affecting right dominant side: Secondary | ICD-10-CM

## 2016-08-12 NOTE — Therapy (Signed)
[facility] [address] [place], [place], [address] Phone: [phone]   Fax:  [phone]  Physical Therapy Treatment  Patient Details  Name: Ashley [name] MRN: [id number] Date of Birth: 1975-11-23 No Data Recorded  Encounter Date: 08/12/2016      PT End of Session - 08/12/16 1248    Visit Number 59   Number of Visits 90   Date for PT Re-Evaluation 09/15/16   Authorization Type BCBS. Per Laverda Page, visits now 30 for PT and OT total (15 per OT and 15 per PT). Pt wishes to continue at self pay after visit limit met.    Authorization - Visit Number 83   Authorization - Number of Visits 16   PT Start Time 1146   PT Stop Time 1228   PT Time Calculation (min) 42 min   Activity Tolerance Patient tolerated treatment well   Behavior During Therapy WFL for tasks assessed/performed      Past Medical History:  Diagnosis Date  . Anxiety   . ICH (intracerebral hemorrhage) ([place])   . Seizures ([place])   . Stroke [facility])     Past Surgical History:  Procedure Laterality Date  . DILATION AND CURETTAGE OF UTERUS      There were no vitals filed for this visit.      Subjective Assessment - 08/12/16 1150    Subjective Pt denied falls since last visit. Pt reported MD prescribed medication for sleep.   Pertinent History Seizures, low BP   Patient Stated Goals "I want to swim, walk better".   Currently in Pain? No/denies             Therex: Please see pt instructions for progressed HEP. All therex performed with S and cues and demo for technique and safety.  -Hamstring curls in prone 3x10reps.  -Bridges in supine x10, 1LE bridges x5 reps, bridges with B hip abd. X10, x5 reps with LLE marches. -LTR stretch 3x30sec. Holds with cervical rotation in contralat. Direction -Seated hamstring stretch with RLE on chair with sheet providing stretch 3x30-60sec. Holds -Discussed and reviewed DF stretch in standing.                       PT Education - 08/12/16 1248    Education provided Yes   Education Details PT reviewed and progressed HEP as tolerated.    Person(s) Educated Patient   Methods Explanation;Demonstration;Tactile cues;Verbal cues;Handout   Comprehension Returned demonstration;Verbalized understanding          PT Short Term Goals - 07/24/16 1219      PT SHORT TERM GOAL #1   Title Pt will be IND in progressed HEP to improve strength, balance and gait deviations. TARGET DATE FOR ALL STGS: 08/14/16   Status On-going     PT SHORT TERM GOAL #2   Title Pt will improve FGA score to >/=24/30 to decr. falls risk.   Status On-going     PT SHORT TERM GOAL #3   Title Pt will trial running on treadmill and write goal if appropriate.   Status On-going     PT SHORT TERM GOAL #4   Title Pt will descend 12 steps in step through pattern, without holding rails at MOD I level (incr. time) to carry objects at home.    Status On-going           PT Long Term Goals - 07/24/16 1219      PT LONG TERM GOAL #  1   Title Pt will improve FGA score to >/=27/30 to decr. falls risk. TARGET DATE FOR ALL LTGS: 09/11/16   Status On-going     PT LONG TERM GOAL #2   Title Pt will report she is able to swim in a shallow pool for 5 minutes to improve strength, quality of life, and to play with kids.    Status On-going               Plan - 08/12/16 1249    Clinical Impression Statement Pt demonstrated progress, as she was able to tolerate stretches in seated position vs. supine. Pt also demonstrated improved strength, as she was able to perform SLR with 2 lb. weight on RLE. Pt continues to experience difficulty performing strengthening which require isolated glute med contraction. Continue with POC.    Rehab Potential Good   Clinical Impairments Affecting Rehab Potential Seizures which may limit intensity of PT   PT Frequency 1x / week   PT Duration 8 weeks   PT  Treatment/Interventions ADLs/Self Care Home Management;Neuromuscular re-education;Cognitive remediation;Biofeedback;DME Instruction;Gait training;Stair training;Canalith Repostioning;Patient/family education;Orthotic Fit/Training;Balance training;Therapeutic exercise;Manual techniques;Therapeutic activities;Vestibular   PT Next Visit Plan Continue Trial blue rocker (RLE) during BWSTT jogging.    PT Home Exercise Plan Stretching/strength/balance HEP   Consulted and Agree with Plan of Care Patient      Patient will benefit from skilled therapeutic intervention in order to improve the following deficits and impairments:  Abnormal gait, Decreased endurance, Impaired sensation, Decreased knowledge of precautions, Decreased activity tolerance, Decreased knowledge of use of DME, Decreased strength, Impaired UE functional use, Impaired tone, Decreased balance, Decreased mobility, Decreased cognition, Decreased range of motion, Decreased safety awareness, Decreased coordination, Impaired flexibility, Postural dysfunction  Visit Diagnosis: Spastic hemiplegia of right dominant side due to cerebrovascular disease, unspecified cerebrovascular disease type ([place])  Other abnormalities of gait and mobility     Problem List Patient Active Problem List   Diagnosis Date Noted  . Spastic hemiplegia and hemiparesis affecting dominant side ([place]) 09/13/2015  . Adhesive capsulitis of right shoulder 08/24/2015  . Symptomatic partial epilepsy with simple partial seizures ([place]) 08/21/2015  . Nontraumatic cortical hemorrhage of cerebral hemisphere ([place])   . Seizure ([place])   . Seizures ([place])   . Adjustment disorder with depressed mood   . Contracture of muscle ankle and foot 06/11/2015  . Hemiplga fol ntrm intcrbl hemor aff right dominant side ([place]) 06/06/2015  . Left-sided intracerebral hemorrhage ([place]) 06/04/2015  . Seizure disorder as sequela of cerebrovascular accident ([place])   . Seizure disorder ([place]) 06/03/2015   . Sepsis ([place]) 06/03/2015  . UTI (urinary tract infection) 06/03/2015  . Hypotension 06/02/2015  . Right spastic hemiparesis ([place]) 05/28/2015  . 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 History of anxiety disorder 05/28/2015  . ICH (intracerebral hemorrhage) ([place])   . Seizure disorder, nonconvulsive, with status epilepticus ([place])   . Cerebral venous thrombosis of cortical vein   . Cytotoxic cerebral edema ([place])   . IVH (intraventricular hemorrhage) ([place])   . Term pregnancy 05/07/2015  . Spontaneous vaginal delivery 05/07/2015    Miller,Jennifer L 08/12/2016, 12:53 PM  [facility] [address] [place], [place], [address] Phone: [phone]   Fax:  [phone]  Name: Ashley [name] MRN: [id number] Date of Birth: 22-Nov-1975   Geoffry Paradise, PT,DPT 08/12/16 12:58 PM Phone: [phone] Fax: [phone]

## 2016-08-12 NOTE — Patient Instructions (Signed)
Piriformis Stretch, Sitting    Sit, one ankle on opposite knee, same-side hand on crossed knee. Push down on knee, keeping spine straight. Lean torso forward, with flat back, until tension is felt in hamstrings and gluteals of crossed-leg side. Hold _30__ seconds.  Repeat _3__ times per session per leg. Do _1-2__ sessions per day.  Copyright  VHI. All rights reserved.   Quadriceps (Straight Leg Raises)    Lie flat with __2__ pound weight around right ankle. Bend left knee. Keeping knee straight, lift __8-10__ inches. May do exercise sitting with back against wall. Keep back straight. Hold __2__ seconds. Repeat __10__ times. Do __3__ sets. Perform 3-4 days per week. CAUTION: Move slowly, avoid jerking.  Copyright  VHI. All rights reserved.

## 2016-08-21 ENCOUNTER — Ambulatory Visit: Payer: BLUE CROSS/BLUE SHIELD | Attending: Psychology

## 2016-08-21 DIAGNOSIS — I679 Cerebrovascular disease, unspecified: Secondary | ICD-10-CM | POA: Diagnosis present

## 2016-08-21 DIAGNOSIS — R2689 Other abnormalities of gait and mobility: Secondary | ICD-10-CM | POA: Diagnosis present

## 2016-08-21 DIAGNOSIS — G8111 Spastic hemiplegia affecting right dominant side: Secondary | ICD-10-CM | POA: Diagnosis present

## 2016-08-21 NOTE — Therapy (Signed)
[facility] [address] [place], [place], [address] Phone: [phone]   Fax:  [phone]  Physical Therapy Treatment  Patient Details  Name: Ashley [name] MRN: [id number] Date of Birth: 03-May-1976 No Data Recorded  Encounter Date: 08/21/2016      PT End of Session - 08/21/16 1628    Visit Number 84   Number of Visits 87   Date for PT Re-Evaluation 09/15/16   Authorization Type BCBS. Per Conan Bowens, visits now 30 for PT and OT total (15 per OT and 15 per PT). Pt wishes to continue at self pay after visit limit met.    Authorization - Visit Number 43   Authorization - Number of Visits 16   PT Start Time 1446   PT Stop Time 1529   PT Time Calculation (min) 43 min   Equipment Utilized During Treatment --  min guard and UE support prn   Activity Tolerance Patient tolerated treatment well   Behavior During Therapy WFL for tasks assessed/performed      Past Medical History:  Diagnosis Date  . Anxiety   . ICH (intracerebral hemorrhage) (HCC)   . Seizures (HCC)   . Stroke [facility])     Past Surgical History:  Procedure Laterality Date  . DILATION AND CURETTAGE OF UTERUS      There were no vitals filed for this visit.      Subjective Assessment - 08/21/16 1448    Subjective Pt denied falls or changes since last visit.   Patient is accompained by: Family member  dtr-Brianna   Pertinent History Seizures, low BP   Currently in Pain? No/denies            [facility] PT Assessment - 08/21/16 1459      Functional Gait  Assessment   Gait assessed  Yes   Gait Level Surface Walks 20 ft in less than 7 sec but greater than 5.5 sec, uses assistive device, slower speed, mild gait deviations, or deviates 6-10 in outside of the 12 in walkway width.  6.5 sec.   Change in Gait Speed Able to change speed, demonstrates mild gait deviations, deviates 6-10 in outside of the 12 in walkway width, or no gait deviations, unable to  achieve a major change in velocity, or uses a change in velocity, or uses an assistive device.   Gait with Horizontal Head Turns Performs head turns smoothly with no change in gait. Deviates no more than 6 in outside 12 in walkway width   Gait with Vertical Head Turns Performs head turns with no change in gait. Deviates no more than 6 in outside 12 in walkway width.   Gait and Pivot Turn Pivot turns safely within 3 sec and stops quickly with no loss of balance.   Step Over Obstacle Is able to step over 2 stacked shoe boxes taped together (9 in total height) without changing gait speed. No evidence of imbalance.   Gait with Narrow Base of Support Ambulates 4-7 steps.   Gait with Eyes Closed Walks 20 ft, no assistive devices, good speed, no evidence of imbalance, normal gait pattern, deviates no more than 6 in outside 12 in walkway width. Ambulates 20 ft in less than 7 sec.   Ambulating Backwards Walks 20 ft, uses assistive device, slower speed, mild gait deviations, deviates 6-10 in outside 12 in walkway width.   Steps Alternating feet, must use rail.   Total Score 24   FGA comment: R RFO not donned  during FGA.                     [facility] Adult PT Treatment/Exercise - 08/21/16 1450      Ambulation/Gait   Stairs Yes   Stairs Assistance 5: Supervision;4: Min guard   Stairs Assistance Details (indicate cue type and reason) Cues for ant. weight shifting. Intermittent UE support for balance, especially to descend steps.    Stair Management Technique No rails;Alternating pattern;One rail Right   Number of Stairs 12   Height of Stairs 6     Exercises   Exercises Knee/Hip     Knee/Hip Exercises: Standing   Lateral Step Up 2 sets;Both;10 reps;Hand Hold: 0;Step Height: 6"   Lateral Step Up Limitations cues for technique and foot placement.   Forward Step Up Both;2 sets;10 reps;Hand Hold: 0;Step Height: 6"   Forward Step Up Limitations Cues for wt. shifting in ant. direction   Step Down  1 set;10 reps;Both;Hand Hold: 0;Hand Hold: 1;Step Height: 6"   Step Down Limitations x10 reps without ball and x5 with ball placed between knees to encourage R VMO activation. Cues for technique (tactile and verbal).                PT Education - 08/21/16 1627    Education provided Yes   Education Details PT discussed goal progress and outcome measure results.    Person(s) Educated Patient   Methods Explanation   Comprehension Verbalized understanding          PT Short Term Goals - 08/21/16 1635      PT SHORT TERM GOAL #1   Title Pt will be IND in progressed HEP to improve strength, balance and gait deviations. TARGET DATE FOR ALL STGS: 08/14/16   Status Achieved     PT SHORT TERM GOAL #2   Title Pt will improve FGA score to >/=24/30 to decr. falls risk.   Status Achieved     PT SHORT TERM GOAL #3   Title Pt will trial running on treadmill and write goal if appropriate.   Status Achieved     PT SHORT TERM GOAL #4   Title Pt will descend 12 steps in step through pattern, without holding rails at MOD I level (incr. time) to carry objects at home.    Status Achieved           PT Long Term Goals - 08/21/16 1636      PT LONG TERM GOAL #1   Title Pt will improve FGA score to >/=27/30 to decr. falls risk. TARGET DATE FOR ALL LTGS: 09/11/16   Status On-going     PT LONG TERM GOAL #2   Title Pt will report she is able to swim in a shallow pool for 5 minutes to improve strength, quality of life, and to play with kids.    Status On-going     PT LONG TERM GOAL #3   Title Pt will be able to jog on treadmill with R blue rocker for 5 minutes in order to jog at home.    Status New               Plan - 08/21/16 1628    Clinical Impression Statement Pt demonstrated progress as she met STGs 1, 2, and 3. Pt partially met STG 4. Pt demonstrated improved RLE control during L foot step down, while squeezing ball between B knees to activate VMO. Pt would continue to  benefit from skilled PT  to improve safety during functional mobility.    Rehab Potential Good   Clinical Impairments Affecting Rehab Potential Seizures which may limit intensity of PT   PT Frequency 1x / week   PT Duration 8 weeks   PT Treatment/Interventions ADLs/Self Care Home Management;Neuromuscular re-education;Cognitive remediation;Biofeedback;DME Instruction;Gait training;Stair training;Canalith Repostioning;Patient/family education;Orthotic Fit/Training;Balance training;Therapeutic exercise;Manual techniques;Therapeutic activities;Vestibular   PT Next Visit Plan Continue Trial blue rocker (RLE) during BWSTT jogging.    PT Home Exercise Plan Stretching/strength/balance HEP   Consulted and Agree with Plan of Care Patient      Patient will benefit from skilled therapeutic intervention in order to improve the following deficits and impairments:  Abnormal gait, Decreased endurance, Impaired sensation, Decreased knowledge of precautions, Decreased activity tolerance, Decreased knowledge of use of DME, Decreased strength, Impaired UE functional use, Impaired tone, Decreased balance, Decreased mobility, Decreased cognition, Decreased range of motion, Decreased safety awareness, Decreased coordination, Impaired flexibility, Postural dysfunction  Visit Diagnosis: Spastic hemiplegia of right dominant side due to cerebrovascular disease, unspecified cerebrovascular disease type ([place])  Other abnormalities of gait and mobility     Problem List Patient Active Problem List   Diagnosis Date Noted  . Spastic hemiplegia and hemiparesis affecting dominant side ([place]) 09/13/2015  . Adhesive capsulitis of right shoulder 08/24/2015  . Symptomatic partial epilepsy with simple partial seizures ([place]) 08/21/2015  . Nontraumatic cortical hemorrhage of cerebral hemisphere ([place])   . Seizure ([place])   . Seizures ([place])   . Adjustment disorder with depressed mood   . Contracture of muscle ankle and foot  06/11/2015  . Hemiplga fol ntrm intcrbl hemor aff right dominant side ([place]) 06/06/2015  . Left-sided intracerebral hemorrhage ([place]) 06/04/2015  . Seizure disorder as sequela of cerebrovascular accident ([place])   . Seizure disorder ([place]) 06/03/2015  . Sepsis ([place]) 06/03/2015  . UTI (urinary tract infection) 06/03/2015  . Hypotension 06/02/2015  . Right spastic hemiparesis ([place]) 05/28/2015  . 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 History of anxiety disorder 05/28/2015  . ICH (intracerebral hemorrhage) ([place])   . Seizure disorder, nonconvulsive, with status epilepticus ([place])   . Cerebral venous thrombosis of cortical vein   . Cytotoxic cerebral edema ([place])   . IVH (intraventricular hemorrhage) ([place])   . Term pregnancy 05/07/2015  . Spontaneous vaginal delivery 05/07/2015    Ashley [name] L 08/21/2016, 4:38 PM  [facility] [address] [place], [place], [address] Phone: [phone]   Fax:  [phone]  Name: Ashley [name] MRN: [id number] Date of Birth: 12-10-1975  Geoffry Paradise, PT,DPT 08/21/16 4:39 PM Phone: [phone] Fax: [phone]

## 2016-08-29 ENCOUNTER — Ambulatory Visit: Payer: BLUE CROSS/BLUE SHIELD

## 2016-09-12 ENCOUNTER — Ambulatory Visit: Payer: BLUE CROSS/BLUE SHIELD | Attending: Psychology

## 2016-09-12 DIAGNOSIS — G8111 Spastic hemiplegia affecting right dominant side: Secondary | ICD-10-CM | POA: Diagnosis present

## 2016-09-12 DIAGNOSIS — R2689 Other abnormalities of gait and mobility: Secondary | ICD-10-CM | POA: Diagnosis present

## 2016-09-12 DIAGNOSIS — I679 Cerebrovascular disease, unspecified: Secondary | ICD-10-CM | POA: Insufficient documentation

## 2016-09-12 NOTE — Therapy (Addendum)
[facility] [address] [place], [place], [address] Phone: [phone]   Fax:  [phone]  Physical Therapy Treatment  Patient Details  Name: Ashley [name] MRN: [id number] Date of Birth: 1976-05-07 No Data Recorded  Encounter Date: 09/12/2016      PT End of Session - 09/12/16 1544    Visit Number 36   Number of Visits 17   Date for PT Re-Evaluation 10/16/15  original POC: 09/15/16   Authorization Type BCBS. Per Laverda Page, visits now 30 for PT and OT total (15 per OT and 15 per PT). Pt wishes to continue at self pay after visit limit met.    Authorization - Visit Number 25   Authorization - Number of Visits 16   PT Start Time 3235   PT Stop Time 1530   PT Time Calculation (min) 44 min   Equipment Utilized During Treatment --  BWSTT   Activity Tolerance Patient tolerated treatment well   Behavior During Therapy WFL for tasks assessed/performed      Past Medical History:  Diagnosis Date  . Anxiety   . ICH (intracerebral hemorrhage) ([place])   . Seizures ([place])   . Stroke [facility])     Past Surgical History:  Procedure Laterality Date  . DILATION AND CURETTAGE OF UTERUS      There were no vitals filed for this visit.      Subjective Assessment - 09/12/16 1449    Subjective Pt reported she fell last night, as she slipped on water that her son had spilled. Pt fell on buttocks and denied hitting head, pt states it doesn't hurt today. Pt has not been performing HEP as ofter 2/2 children being sick at home.    Pertinent History Seizures, low BP   Patient Stated Goals "I want to swim, walk better".   Currently in Pain? No/denies                         [facility] Adult PT Treatment/Exercise - 09/12/16 1459      Ambulation/Gait   Ambulation/Gait Yes   Ambulation/Gait Assistance 5: Supervision;Other (comment)  BWSTT   Ambulation/Gait Assistance Details Pt performed BWSTT with R blue rocker AFO donned  ranging from speeds 2.5-3.52mh, incr. speed by 0.2-0.5MPH increments to progress to jogging. Cues to improve cadence and decr. B stance time to progress to jogging vs. fast paced walking. Pt required 2 rest breaks 2/2 fatigue. Cues to improve R hip flexion, decr. Post. Hip rotation, and improve R toe clearance during first 3 minutes of amb. on treadmill.  Intermittent UE support.   Ambulation Distance (Feet) --  BWSTT-no distance recorded,only time (32 minutes), 5x6 minute intervals with 2 minute cool-down 2/2 R adductor cramping.   Assistive device Body weight support system;None   Gait Pattern Decreased stance time - right;Decreased weight shift to right;Right foot flat;Decreased hip/knee flexion - right;Step-through pattern;Decreased step length - left;Trunk rotated posteriorly on right   Ambulation Surface Other (comment)  BWSTT           Pre-gait training:     PT Education - 09/12/16 1540    Education provided Yes   Education Details Prior to BWSTT PT and pt viewed website and video regarding new type of external AFO (around shoe vs. inside shoe) [employer] FS3000 for jogging, as orthotist provided PT and pt info in the case the blue rocker does not assist pt in running. PT educated pt that it would be  best to stay with the blue rocker, as pt denies discomfort and stated "I forgot it was on" during BWSTT today.    Person(s) Educated Patient   Methods Explanation;Other (comment);Verbal cues  video   Comprehension Verbalized understanding          PT Short Term Goals - 08/21/16 1635      PT SHORT TERM GOAL #1   Title Pt will be IND in progressed HEP to improve strength, balance and gait deviations. TARGET DATE FOR ALL STGS: 08/14/16   Status Achieved     PT SHORT TERM GOAL #2   Title Pt will improve FGA score to >/=24/30 to decr. falls risk.   Status Achieved     PT SHORT TERM GOAL #3   Title Pt will trial running on treadmill and write goal if appropriate.   Status Achieved      PT SHORT TERM GOAL #4   Title Pt will descend 12 steps in step through pattern, without holding rails at MOD I level (incr. time) to carry objects at home.    Status Achieved           PT Long Term Goals - 09/12/16 1549      PT LONG TERM GOAL #1   Title Pt will improve FGA score to >/=27/30 to decr. falls risk. TARGET DATE FOR ALL LTGS: 10/15/16   Status On-going     PT LONG TERM GOAL #2   Title Pt will report she is able to swim in a shallow pool for 5 minutes to improve strength, quality of life, and to play with kids.    Status On-going     PT LONG TERM GOAL #3   Title Pt will be able to jog on treadmill with R blue rocker for 5 minutes in order to jog at home.    Status On-going               Plan - 09/12/16 1545    Clinical Impression Statement All unmet goals deferred to new POC, as pt missed last 3 weeks due to illness in her family. Pt demonstrated progress, as she was able to tolerated BWSTT with and without UE support, at incr. speeds to allow pt to jog. Pt continues to require cues to improve gait deviations and decr. stance time on BLEs in order to jog vs. fast-paced amb. Pt noted to jog on toes vs. heel strike, PT will continue to address gait deviations and balance impairments to improve pt's safety during functional mobility.    Rehab Potential Good   Clinical Impairments Affecting Rehab Potential Seizures which may limit intensity of PT   PT Frequency 1x / week   PT Duration 8 weeks   PT Treatment/Interventions ADLs/Self Care Home Management;Neuromuscular re-education;Cognitive remediation;Biofeedback;DME Instruction;Gait training;Stair training;Canalith Repostioning;Patient/family education;Orthotic Fit/Training;Balance training;Therapeutic exercise;Manual techniques;Therapeutic activities;Vestibular   PT Next Visit Plan Football drills (incr. speed and ladder drills) Continue Trial blue rocker (RLE) during BWSTT jogging.    PT Home Exercise Plan  Stretching/strength/balance HEP   Consulted and Agree with Plan of Care Patient      Patient will benefit from skilled therapeutic intervention in order to improve the following deficits and impairments:  Abnormal gait, Decreased endurance, Impaired sensation, Decreased knowledge of precautions, Decreased activity tolerance, Decreased knowledge of use of DME, Decreased strength, Impaired UE functional use, Impaired tone, Decreased balance, Decreased mobility, Decreased cognition, Decreased range of motion, Decreased safety awareness, Decreased coordination, Impaired flexibility, Postural dysfunction  Visit Diagnosis: Spastic  hemiplegia of right dominant side due to cerebrovascular disease, unspecified cerebrovascular disease type ([place]) - Plan: PT plan of care cert/re-cert  Other abnormalities of gait and mobility - Plan: PT plan of care cert/re-cert     Problem List Patient Active Problem List   Diagnosis Date Noted  . Spastic hemiplegia and hemiparesis affecting dominant side ([place]) 09/13/2015  . Adhesive capsulitis of right shoulder 08/24/2015  . Symptomatic partial epilepsy with simple partial seizures ([place]) 08/21/2015  . Nontraumatic cortical hemorrhage of cerebral hemisphere ([place])   . Seizure ([place])   . Seizures ([place])   . Adjustment disorder with depressed mood   . Contracture of muscle ankle and foot 06/11/2015  . Hemiplga fol ntrm intcrbl hemor aff right dominant side ([place]) 06/06/2015  . Left-sided intracerebral hemorrhage ([place]) 06/04/2015  . Seizure disorder as sequela of cerebrovascular accident ([place])   . Seizure disorder ([place]) 06/03/2015  . Sepsis ([place]) 06/03/2015  . UTI (urinary tract infection) 06/03/2015  . Hypotension 06/02/2015  . Right spastic hemiparesis ([place]) 05/28/2015  . 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 History of anxiety disorder 05/28/2015  . ICH (intracerebral hemorrhage) ([place])   . Seizure disorder, nonconvulsive, with  status epilepticus ([place])   . Cerebral venous thrombosis of cortical vein   . Cytotoxic cerebral edema ([place])   . IVH (intraventricular hemorrhage) ([place])   . Term pregnancy 05/07/2015  . Spontaneous vaginal delivery 05/07/2015    Miller,Jennifer L 09/12/2016, 4:01 PM  [facility] [address] [place], [place], [address] Phone: [phone]   Fax:  [phone]  Name: Jeilyn Reznik MRN: [id number] Date of Birth: Nov 07, 1975  Geoffry Paradise, PT,DPT 09/12/16 4:01 PM Phone: [phone] Fax: [phone]

## 2016-09-19 ENCOUNTER — Ambulatory Visit: Payer: BLUE CROSS/BLUE SHIELD

## 2016-09-25 ENCOUNTER — Ambulatory Visit: Payer: BLUE CROSS/BLUE SHIELD

## 2016-10-02 ENCOUNTER — Ambulatory Visit: Payer: BLUE CROSS/BLUE SHIELD

## 2016-10-02 DIAGNOSIS — R2689 Other abnormalities of gait and mobility: Secondary | ICD-10-CM

## 2016-10-02 DIAGNOSIS — G8111 Spastic hemiplegia affecting right dominant side: Secondary | ICD-10-CM

## 2016-10-02 DIAGNOSIS — I679 Cerebrovascular disease, unspecified: Principal | ICD-10-CM

## 2016-10-02 NOTE — Therapy (Signed)
[facility] [address] [place], [place], [address] Phone: [phone]   Fax:  [phone]  Physical Therapy Treatment  Patient Details  Name: Ashley [name] MRN: [id number] Date of Birth: 11-16-1975 No Data Recorded  Encounter Date: 10/02/2016      PT End of Session - 10/02/16 1150    Visit Number 75   Number of Visits 80   Date for PT Re-Evaluation 10/16/15   Authorization Type BCBS. Per Laverda Page, visits now 30 for PT and OT total (15 per OT and 15 per PT). Pt wishes to continue at self pay after visit limit met.    Authorization - Visit Number 23   Authorization - Number of Visits 16   PT Start Time 1106   PT Stop Time 1145   PT Time Calculation (min) 39 min   Equipment Utilized During Treatment Gait belt   Activity Tolerance Patient tolerated treatment well   Behavior During Therapy WFL for tasks assessed/performed      Past Medical History:  Diagnosis Date  . Anxiety   . ICH (intracerebral hemorrhage) ([place])   . Seizures ([place])   . Stroke [facility])     Past Surgical History:  Procedure Laterality Date  . DILATION AND CURETTAGE OF UTERUS      There were no vitals filed for this visit.      Subjective Assessment - 10/02/16 1109    Subjective (P)  Pt denied falls or changes since last night.    Patient is accompained by: (P)  Family member  kids   Pertinent History (P)  Seizures, low BP   Patient Stated Goals (P)  "I want to swim, walk better".   Currently in Pain? (P)  No/denies           Neuro re-ed: Today's session focused on speed and agility training to improve quick weight shifting on feet to improve balance during gait/run training.  Pt performed ladder drills: (one foot out, one foot out, one foot in, one foot in): x20 reps, then sidestep ladder drills x20 reps. Pt performed football drills: mini squat position with fast weight shift on toes and arms in high guard, then performing 1/4  turn 5x10 reps. Pt then amb. Over even terrain quickly (3x115') and performed ladder drill intermittently during amb.  Performed with min guard to S for safety. Cues and demo for technique.                       PT Education - 10/02/16 1149    Education provided Yes   Education Details PT discussed adding 2 add'l visits due to pt missing visits due to holiday and therapist out.    Person(s) Educated Patient   Methods Explanation   Comprehension Verbalized understanding          PT Short Term Goals - 08/21/16 1635      PT SHORT TERM GOAL #1   Title Pt will be IND in progressed HEP to improve strength, balance and gait deviations. TARGET DATE FOR ALL STGS: 08/14/16   Status Achieved     PT SHORT TERM GOAL #2   Title Pt will improve FGA score to >/=24/30 to decr. falls risk.   Status Achieved     PT SHORT TERM GOAL #3   Title Pt will trial running on treadmill and write goal if appropriate.   Status Achieved     PT SHORT TERM GOAL #4   Title  Pt will descend 12 steps in step through pattern, without holding rails at MOD I level (incr. time) to carry objects at home.    Status Achieved           PT Long Term Goals - 09/12/16 1549      PT LONG TERM GOAL #1   Title Pt will improve FGA score to >/=27/30 to decr. falls risk. TARGET DATE FOR ALL LTGS: 10/15/16   Status On-going     PT LONG TERM GOAL #2   Title Pt will report she is able to swim in a shallow pool for 5 minutes to improve strength, quality of life, and to play with kids.    Status On-going     PT LONG TERM GOAL #3   Title Pt will be able to jog on treadmill with R blue rocker for 5 minutes in order to jog at home.    Status On-going               Plan - 10/02/16 1152    Clinical Impression Statement Skilled session today focused on speed and agility training, to carryover incr. speed during jogging. Pt continues to require cues to reduce RUE flexion and to decr. stance time during  speed activities. Continue with POC.    Rehab Potential Good   Clinical Impairments Affecting Rehab Potential Seizures which may limit intensity of PT   PT Frequency 1x / week   PT Duration 8 weeks   PT Treatment/Interventions ADLs/Self Care Home Management;Neuromuscular re-education;Cognitive remediation;Biofeedback;DME Instruction;Gait training;Stair training;Canalith Repostioning;Patient/family education;Orthotic Fit/Training;Balance training;Therapeutic exercise;Manual techniques;Therapeutic activities;Vestibular   PT Next Visit Plan Football drills (incr. speed and ladder drills) and Continue Trial blue rocker (RLE) during BWSTT jogging.    PT Home Exercise Plan Stretching/strength/balance HEP   Consulted and Agree with Plan of Care Patient      Patient will benefit from skilled therapeutic intervention in order to improve the following deficits and impairments:  Abnormal gait, Decreased endurance, Impaired sensation, Decreased knowledge of precautions, Decreased activity tolerance, Decreased knowledge of use of DME, Decreased strength, Impaired UE functional use, Impaired tone, Decreased balance, Decreased mobility, Decreased cognition, Decreased range of motion, Decreased safety awareness, Decreased coordination, Impaired flexibility, Postural dysfunction  Visit Diagnosis: Spastic hemiplegia of right dominant side due to cerebrovascular disease, unspecified cerebrovascular disease type ([place])  Other abnormalities of gait and mobility     Problem List Patient Active Problem List   Diagnosis Date Noted  . Spastic hemiplegia and hemiparesis affecting dominant side ([place]) 09/13/2015  . Adhesive capsulitis of right shoulder 08/24/2015  . Symptomatic partial epilepsy with simple partial seizures ([place]) 08/21/2015  . Nontraumatic cortical hemorrhage of cerebral hemisphere ([place])   . Seizure ([place])   . Seizures ([place])   . Adjustment disorder with depressed mood   . Contracture of muscle  ankle and foot 06/11/2015  . Hemiplga fol ntrm intcrbl hemor aff right dominant side ([place]) 06/06/2015  . Left-sided intracerebral hemorrhage ([place]) 06/04/2015  . Seizure disorder as sequela of cerebrovascular accident ([place])   . Seizure disorder ([place]) 06/03/2015  . Sepsis ([place]) 06/03/2015  . UTI (urinary tract infection) 06/03/2015  . Hypotension 06/02/2015  . Right spastic hemiparesis ([place]) 05/28/2015  . 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 History of anxiety disorder 05/28/2015  . ICH (intracerebral hemorrhage) ([place])   . Seizure disorder, nonconvulsive, with status epilepticus ([place])   . Cerebral venous thrombosis of cortical vein   . Cytotoxic cerebral edema ([place])   .  IVH (intraventricular hemorrhage) ([place])   . Term pregnancy 05/07/2015  . Spontaneous vaginal delivery 05/07/2015    Miller,Jennifer L 10/02/2016, 12:05 PM  [facility] [address] [place], [place], [address] Phone: [phone]   Fax:  [phone]  Name: Ashley [name] MRN: [id number] Date of Birth: 09-Jun-1976   Geoffry Paradise, PT,DPT 10/02/16 12:08 PM Phone: [phone] Fax: [phone]

## 2016-10-07 ENCOUNTER — Ambulatory Visit: Payer: BLUE CROSS/BLUE SHIELD

## 2016-10-16 ENCOUNTER — Ambulatory Visit: Payer: BLUE CROSS/BLUE SHIELD | Attending: Psychology

## 2016-10-16 DIAGNOSIS — I679 Cerebrovascular disease, unspecified: Secondary | ICD-10-CM | POA: Insufficient documentation

## 2016-10-16 DIAGNOSIS — R2689 Other abnormalities of gait and mobility: Secondary | ICD-10-CM | POA: Insufficient documentation

## 2016-10-16 DIAGNOSIS — G8111 Spastic hemiplegia affecting right dominant side: Secondary | ICD-10-CM | POA: Diagnosis present

## 2016-10-16 NOTE — Therapy (Signed)
[facility] [address] [place], [place], [address] Phone: [phone]   Fax:  [phone]  Physical Therapy Treatment  Patient Details  Name: Ashley [name] MRN: [id number] Date of Birth: 1976-03-30 No Data Recorded  Encounter Date: 10/16/2016      PT End of Session - 10/16/16 1614    Visit Number 87   Number of Visits 51   Date for PT Re-Evaluation 10/16/15  new POC to cover 10/16/16 and 10/21/16 visits   Authorization Type BC[employer]. Per Laverda Page, visits now 30 for PT and OT total (15 per OT and 15 per PT). Pt wishes to continue at self pay after visit limit met.    PT Start Time 1404   PT Stop Time 1447   PT Time Calculation (min) 43 min   Equipment Utilized During Treatment --  BWSTT harness   Activity Tolerance Patient tolerated treatment well   Behavior During Therapy WFL for tasks assessed/performed      Past Medical History:  Diagnosis Date  . Anxiety   . ICH (intracerebral hemorrhage) ([place])   . Seizures ([place])   . Stroke [facility])     Past Surgical History:  Procedure Laterality Date  . DILATION AND CURETTAGE OF UTERUS      There were no vitals filed for this visit.      Subjective Assessment - 10/16/16 1406    Subjective Pt denied falls or changes since last visit.    Pertinent History Seizures, low BP   Patient Stated Goals "I want to swim, walk better".   Currently in Pain? No/denies                         [facility] Adult PT Treatment/Exercise - 10/16/16 1407      Ambulation/Gait   Ambulation/Gait Yes   Ambulation/Gait Assistance 5: Supervision;Other (comment)  BWSTT   Ambulation/Gait Assistance Details Pt performed BWSTT with R blue rocker AFO donned ranging from speeds 2.5-4.54mh, incr. speed by 0.2MPH increments to progress to jogging. Cues to improve cadence and decr. B stance time to progress to jogging vs. fast paced walking. Pt required 2 rest breaks 2/2 fatigue. Cues to  improve R hip flexion and toe clearance during 4.016m speed. Pt then amb. overground, 200' after treadmill training.   Ambulation Distance (Feet) 200 Feet  BWSTT-0.36 miles,0.37 miles+distance during warm-up   Assistive device Body weight support system;None   Gait Pattern Decreased stance time - right;Decreased weight shift to right;Right foot flat;Decreased hip/knee flexion - right;Step-through pattern;Decreased step length - left;Trunk rotated posteriorly on right                PT Education - 10/16/16 1613    Education provided Yes   Education Details PT educated pt that R blue rocker would be best AFO for pt to attempt running in the future, but that pt is not ready to run without support at this time.    Person(s) Educated Patient   Methods Explanation   Comprehension Verbalized understanding          PT Short Term Goals - 08/21/16 1635      PT SHORT TERM GOAL #1   Title Pt will be IND in progressed HEP to improve strength, balance and gait deviations. TARGET DATE FOR ALL STGS: 08/14/16   Status Achieved     PT SHORT TERM GOAL #2   Title Pt will improve FGA score to >/=24/30 to decr. falls risk.  Status Achieved     PT SHORT TERM GOAL #3   Title Pt will trial running on treadmill and write goal if appropriate.   Status Achieved     PT SHORT TERM GOAL #4   Title Pt will descend 12 steps in step through pattern, without holding rails at MOD I level (incr. time) to carry objects at home.    Status Achieved           PT Long Term Goals - 09/12/16 1549      PT LONG TERM GOAL #1   Title Pt will improve FGA score to >/=27/30 to decr. falls risk. TARGET DATE FOR ALL LTGS: 10/15/16   Status On-going     PT LONG TERM GOAL #2   Title Pt will report she is able to swim in a shallow pool for 5 minutes to improve strength, quality of life, and to play with kids.    Status On-going     PT LONG TERM GOAL #3   Title Pt will be able to jog on treadmill with R blue  rocker for 5 minutes in order to jog at home.    Status On-going               Plan - 10/16/16 1615    Clinical Impression Statement Pt continues to require cues to improve cadence, speed, and RLE gait deviations during gait/running on BWSTT. Pt's fear of falling, despite BWSTT harness, is one of the main reasons pt has difficulty incr. speed during running training on treadmill. Pt reported R shin pain after amb. and BWSTT, pt doffed L AFO and two bruises were noted on pt's shin, but AFO was not donned in tight manner and pt reported she's been having RLE pain intermittently after wearing boots over the holidays. PT will continue to monitor.    Rehab Potential Good   Clinical Impairments Affecting Rehab Potential Seizures which may limit intensity of PT   PT Frequency 1x / week   PT Duration 8 weeks   PT Treatment/Interventions ADLs/Self Care Home Management;Neuromuscular re-education;Cognitive remediation;Biofeedback;DME Instruction;Gait training;Stair training;Canalith Repostioning;Patient/family education;Orthotic Fit/Training;Balance training;Therapeutic exercise;Manual techniques;Therapeutic activities;Vestibular   PT Next Visit Plan Check goals and hold PT.    PT Home Exercise Plan Stretching/strength/balance HEP   Consulted and Agree with Plan of Care Patient      Patient will benefit from skilled therapeutic intervention in order to improve the following deficits and impairments:  Abnormal gait, Decreased endurance, Impaired sensation, Decreased knowledge of precautions, Decreased activity tolerance, Decreased knowledge of use of DME, Decreased strength, Impaired UE functional use, Impaired tone, Decreased balance, Decreased mobility, Decreased cognition, Decreased range of motion, Decreased safety awareness, Decreased coordination, Impaired flexibility, Postural dysfunction  Visit Diagnosis: Other abnormalities of gait and mobility  Spastic hemiplegia of right dominant side  due to cerebrovascular disease, unspecified cerebrovascular disease type [facility])     Problem List Patient Active Problem List   Diagnosis Date Noted  . Spastic hemiplegia and hemiparesis affecting dominant side ([place]) 09/13/2015  . Adhesive capsulitis of right shoulder 08/24/2015  . Symptomatic partial epilepsy with simple partial seizures ([place]) 08/21/2015  . Nontraumatic cortical hemorrhage of cerebral hemisphere ([place])   . Seizure ([place])   . Seizures ([place])   . Adjustment disorder with depressed mood   . Contracture of muscle ankle and foot 06/11/2015  . Hemiplga fol ntrm intcrbl hemor aff right dominant side ([place]) 06/06/2015  . Left-sided intracerebral hemorrhage ([place]) 06/04/2015  . Seizure disorder as sequela  of cerebrovascular accident ([place])   . Seizure disorder ([place]) 06/03/2015  . Sepsis ([place]) 06/03/2015  . UTI (urinary tract infection) 06/03/2015  . Hypotension 06/02/2015  . Right spastic hemiparesis ([place]) 05/28/2015  . 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 History of anxiety disorder 05/28/2015  . ICH (intracerebral hemorrhage) ([place])   . Seizure disorder, nonconvulsive, with status epilepticus ([place])   . Cerebral venous thrombosis of cortical vein   . Cytotoxic cerebral edema ([place])   . IVH (intraventricular hemorrhage) ([place])   . Term pregnancy 05/07/2015  . Spontaneous vaginal delivery 05/07/2015    Marisel Tostenson L 10/16/2016, 4:19 PM  [facility] [address] [place], [place], [address] Phone: [phone]   Fax:  [phone]  Name: Hendel Gatliff MRN: [id number] Date of Birth: January 09, 1976  Geoffry Paradise, PT,DPT 10/16/16 4:19 PM Phone: [phone] Fax: [phone]

## 2016-10-16 NOTE — Addendum Note (Signed)
Addended by: Elza Rafter on: 10/16/2016 04:23 PM   Modules accepted: Orders

## 2016-10-21 ENCOUNTER — Ambulatory Visit: Payer: BLUE CROSS/BLUE SHIELD

## 2016-10-21 DIAGNOSIS — I679 Cerebrovascular disease, unspecified: Secondary | ICD-10-CM

## 2016-10-21 DIAGNOSIS — G8111 Spastic hemiplegia affecting right dominant side: Secondary | ICD-10-CM

## 2016-10-21 DIAGNOSIS — R2689 Other abnormalities of gait and mobility: Secondary | ICD-10-CM

## 2016-10-21 NOTE — Therapy (Signed)
[facility] [address] [place], [place], [address] Phone: [phone]   Fax:  [phone]  Physical Therapy Treatment  Patient Details  Name: Ashley [name] MRN: [id number] Date of Birth: 1975/10/15 No Data Recorded  Encounter Date: 10/21/2016      PT End of Session - 10/21/16 1057    Visit Number 3   Number of Visits 78   Date for PT Re-Evaluation 10/16/15   Authorization Type BCBS. Per Laverda Page, visits now 30 for PT and OT total (15 per OT and 15 per PT). Pt wishes to continue at self pay after visit limit met.    PT Start Time 1016   PT Stop Time 1048   PT Time Calculation (min) 32 min   Equipment Utilized During Treatment --  min guard prn   Activity Tolerance Patient tolerated treatment well   Behavior During Therapy WFL for tasks assessed/performed      Past Medical History:  Diagnosis Date  . Anxiety   . ICH (intracerebral hemorrhage) ([place])   . Seizures ([place])   . Stroke [facility])     Past Surgical History:  Procedure Laterality Date  . DILATION AND CURETTAGE OF UTERUS      There were no vitals filed for this visit.      Subjective Assessment - 10/21/16 1020    Subjective Pt denied falls or changes since last visit. Pt has not attempted swimming with kids in the pool due to weather.    Pertinent History Seizures, low BP   Patient Stated Goals "I want to swim, walk better".   Currently in Pain? No/denies            [facility] PT Assessment - 10/21/16 1022      Functional Gait  Assessment   Gait assessed  Yes   Gait Level Surface Walks 20 ft in less than 7 sec but greater than 5.5 sec, uses assistive device, slower speed, mild gait deviations, or deviates 6-10 in outside of the 12 in walkway width.  6.4sec.   Change in Gait Speed Able to smoothly change walking speed without loss of balance or gait deviation. Deviate no more than 6 in outside of the 12 in walkway width.   Gait with Horizontal  Head Turns Performs head turns smoothly with no change in gait. Deviates no more than 6 in outside 12 in walkway width   Gait with Vertical Head Turns Performs head turns with no change in gait. Deviates no more than 6 in outside 12 in walkway width.   Gait and Pivot Turn Pivot turns safely within 3 sec and stops quickly with no loss of balance.   Step Over Obstacle Is able to step over 2 stacked shoe boxes taped together (9 in total height) without changing gait speed. No evidence of imbalance.   Gait with Narrow Base of Support Ambulates 7-9 steps.   Gait with Eyes Closed Walks 20 ft, no assistive devices, good speed, no evidence of imbalance, normal gait pattern, deviates no more than 6 in outside 12 in walkway width. Ambulates 20 ft in less than 7 sec.   Ambulating Backwards Walks 20 ft, no assistive devices, good speed, no evidence for imbalance, normal gait   Steps Alternating feet, must use rail.   Total Score 27   FGA comment: R AFO not donned.                        Self  Care:      PT Education - 10/21/16 1049    Education provided Yes   Education Details PT discussed goal progress and placing pt on hold to allow pt to work on speed activities and decide if she wants the R blue rocker AFO to attempt running. PT will resume therapy with focus on running in 1-2 months based on pt's decision.  PT provided pt with blue rocker and [employer] information and pt understands that blue rocker would most likley not be covered by insurance. PT educated pt that not donning AFO during amb., incr. gait deviations.   Person(s) Educated Patient   Methods Explanation   Comprehension Verbalized understanding          PT Short Term Goals - 08/21/16 1635      PT SHORT TERM GOAL #1   Title Pt will be IND in progressed HEP to improve strength, balance and gait deviations. TARGET DATE FOR ALL STGS: 08/14/16   Status Achieved     PT SHORT TERM GOAL #2   Title Pt will improve FGA score  to >/=24/30 to decr. falls risk.   Status Achieved     PT SHORT TERM GOAL #3   Title Pt will trial running on treadmill and write goal if appropriate.   Status Achieved     PT SHORT TERM GOAL #4   Title Pt will descend 12 steps in step through pattern, without holding rails at MOD I level (incr. time) to carry objects at home.    Status Achieved           PT Long Term Goals - 10/21/16 1059      PT LONG TERM GOAL #1   Title Pt will improve FGA score to >/=27/30 to decr. falls risk. TARGET DATE FOR ALL LTGS: 10/15/16   Status Achieved     PT LONG TERM GOAL #2   Title Pt will report she is able to swim in a shallow pool for 5 minutes to improve strength, quality of life, and to play with kids.    Status Deferred     PT LONG TERM GOAL #3   Title Pt will be able to jog on treadmill with R blue rocker for 5 minutes in order to jog at home.    Status Partially Met               Plan - 10/21/16 1057    Clinical Impression Statement Pt met LTG 1, pt partially met LTG 3 and LTG 2 deferred based on cold weather. PT placing pt on hold to focus on speed/agility exercises in order to prepare pt for running training, based on pt's decision regarding R blue rocker.    Rehab Potential Good   Clinical Impairments Affecting Rehab Potential Seizures which may limit intensity of PT   PT Frequency 1x / week   PT Duration 8 weeks   PT Treatment/Interventions ADLs/Self Care Home Management;Neuromuscular re-education;Cognitive remediation;Biofeedback;DME Instruction;Gait training;Stair training;Canalith Repostioning;Patient/family education;Orthotic Fit/Training;Balance training;Therapeutic exercise;Manual techniques;Therapeutic activities;Vestibular   PT Next Visit Plan Hold pt. running training with blue rocker and speed/agility training.   PT Home Exercise Plan Stretching/strength/balance HEP   Consulted and Agree with Plan of Care Patient      Patient will benefit from skilled  therapeutic intervention in order to improve the following deficits and impairments:  Abnormal gait, Decreased endurance, Impaired sensation, Decreased knowledge of precautions, Decreased activity tolerance, Decreased knowledge of use of DME, Decreased strength, Impaired UE functional use, Impaired  tone, Decreased balance, Decreased mobility, Decreased cognition, Decreased range of motion, Decreased safety awareness, Decreased coordination, Impaired flexibility, Postural dysfunction  Visit Diagnosis: Other abnormalities of gait and mobility  Spastic hemiplegia of right dominant side due to cerebrovascular disease, unspecified cerebrovascular disease type [facility])     Problem List Patient Active Problem List   Diagnosis Date Noted  . Spastic hemiplegia and hemiparesis affecting dominant side ([place]) 09/13/2015  . Adhesive capsulitis of right shoulder 08/24/2015  . Symptomatic partial epilepsy with simple partial seizures ([place]) 08/21/2015  . Nontraumatic cortical hemorrhage of cerebral hemisphere ([place])   . Seizure ([place])   . Seizures ([place])   . Adjustment disorder with depressed mood   . Contracture of muscle ankle and foot 06/11/2015  . Hemiplga fol ntrm intcrbl hemor aff right dominant side ([place]) 06/06/2015  . Left-sided intracerebral hemorrhage ([place]) 06/04/2015  . Seizure disorder as sequela of cerebrovascular accident ([place])   . Seizure disorder ([place]) 06/03/2015  . Sepsis ([place]) 06/03/2015  . UTI (urinary tract infection) 06/03/2015  . Hypotension 06/02/2015  . Right spastic hemiparesis ([place]) 05/28/2015  . 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 History of anxiety disorder 05/28/2015  . ICH (intracerebral hemorrhage) ([place])   . Seizure disorder, nonconvulsive, with status epilepticus ([place])   . Cerebral venous thrombosis of cortical vein   . Cytotoxic cerebral edema ([place])   . IVH (intraventricular hemorrhage) ([place])   . Term pregnancy 05/07/2015  . Spontaneous  vaginal delivery 05/07/2015    Lia Vigilante L 10/21/2016, 11:00 AM  [facility] [address] [place], [place], [address] Phone: [phone]   Fax:  [phone]  Name: Ashley [name] MRN: [id number] Date of Birth: 1976-06-03  Geoffry Paradise, PT,DPT 10/21/16 11:01 AM Phone: [phone] Fax: [phone]

## 2016-11-26 ENCOUNTER — Inpatient Hospital Stay (HOSPITAL_COMMUNITY): Payer: BLUE CROSS/BLUE SHIELD

## 2016-11-26 ENCOUNTER — Observation Stay (HOSPITAL_COMMUNITY)
Admission: EM | Admit: 2016-11-26 | Discharge: 2016-11-27 | Disposition: A | Discharge status: Home / Self Care | Payer: BLUE CROSS/BLUE SHIELD | Attending: Internal Medicine | Admitting: Internal Medicine

## 2016-11-26 ENCOUNTER — Emergency Department (HOSPITAL_COMMUNITY): Payer: BLUE CROSS/BLUE SHIELD

## 2016-11-26 DIAGNOSIS — F419 Anxiety disorder, unspecified: Secondary | ICD-10-CM | POA: Insufficient documentation

## 2016-11-26 DIAGNOSIS — G40109 Localization-related (focal) (partial) symptomatic epilepsy and epileptic syndromes with simple partial seizures, not intractable, without status epilepticus: Secondary | ICD-10-CM | POA: Diagnosis present

## 2016-11-26 DIAGNOSIS — R51 Headache: Secondary | ICD-10-CM | POA: Diagnosis not present

## 2016-11-26 DIAGNOSIS — G9389 Other specified disorders of brain: Secondary | ICD-10-CM | POA: Diagnosis not present

## 2016-11-26 DIAGNOSIS — I69398 Other sequelae of cerebral infarction: Secondary | ICD-10-CM | POA: Diagnosis not present

## 2016-11-26 DIAGNOSIS — F4321 Adjustment disorder with depressed mood: Secondary | ICD-10-CM | POA: Insufficient documentation

## 2016-11-26 DIAGNOSIS — Z7982 Long term (current) use of aspirin: Secondary | ICD-10-CM | POA: Insufficient documentation

## 2016-11-26 DIAGNOSIS — R569 Unspecified convulsions: Secondary | ICD-10-CM | POA: Diagnosis not present

## 2016-11-26 DIAGNOSIS — R4701 Aphasia: Secondary | ICD-10-CM | POA: Insufficient documentation

## 2016-11-26 DIAGNOSIS — G8111 Spastic hemiplegia affecting right dominant side: Secondary | ICD-10-CM | POA: Diagnosis not present

## 2016-11-26 LAB — CBC
HEMATOCRIT: 36.4 % (ref 36.0–46.0)
Hemoglobin: 11.9 g/dL — ABNORMAL LOW (ref 12.0–15.0)
MCH: 30.6 pg (ref 26.0–34.0)
MCHC: 32.7 g/dL (ref 30.0–36.0)
MCV: 93.6 fL (ref 78.0–100.0)
PLATELETS: 234 10*3/uL (ref 150–400)
RBC: 3.89 MIL/uL (ref 3.87–5.11)
RDW: 12.4 % (ref 11.5–15.5)
WBC: 6.6 10*3/uL (ref 4.0–10.5)

## 2016-11-26 LAB — DIFFERENTIAL
BASOS ABS: 0 10*3/uL (ref 0.0–0.1)
BASOS PCT: 1 %
Eosinophils Absolute: 0 10*3/uL (ref 0.0–0.7)
Eosinophils Relative: 1 %
LYMPHS PCT: 48 %
Lymphs Abs: 3.1 10*3/uL (ref 0.7–4.0)
MONO ABS: 0.4 10*3/uL (ref 0.1–1.0)
MONOS PCT: 7 %
Neutro Abs: 2.9 10*3/uL (ref 1.7–7.7)
Neutrophils Relative %: 45 %

## 2016-11-26 LAB — I-STAT CHEM 8, ED
BUN: 14 mg/dL (ref 6–20)
CHLORIDE: 98 mmol/L — AB (ref 101–111)
Calcium, Ion: 1.13 mmol/L — ABNORMAL LOW (ref 1.15–1.40)
Creatinine, Ser: 0.6 mg/dL (ref 0.44–1.00)
Glucose, Bld: 89 mg/dL (ref 65–99)
HEMATOCRIT: 37 % (ref 36.0–46.0)
HEMOGLOBIN: 12.6 g/dL (ref 12.0–15.0)
POTASSIUM: 3.8 mmol/L (ref 3.5–5.1)
Sodium: 135 mmol/L (ref 135–145)
TCO2: 26 mmol/L (ref 0–100)

## 2016-11-26 LAB — I-STAT TROPONIN, ED: Troponin i, poc: 0 ng/mL (ref 0.00–0.08)

## 2016-11-26 LAB — COMPREHENSIVE METABOLIC PANEL
ALK PHOS: 50 U/L (ref 38–126)
ALT: 23 U/L (ref 14–54)
AST: 35 U/L (ref 15–41)
Albumin: 4.6 g/dL (ref 3.5–5.0)
Anion gap: 13 (ref 5–15)
BUN: 12 mg/dL (ref 6–20)
CALCIUM: 9.5 mg/dL (ref 8.9–10.3)
CHLORIDE: 98 mmol/L — AB (ref 101–111)
CO2: 26 mmol/L (ref 22–32)
CREATININE: 0.71 mg/dL (ref 0.44–1.00)
GFR calc non Af Amer: >60 mL/min (ref >60)
Glucose, Bld: 91 mg/dL (ref 65–99)
Potassium: 3.8 mmol/L (ref 3.5–5.1)
SODIUM: 137 mmol/L (ref 135–145)
Total Bilirubin: 0.6 mg/dL (ref 0.3–1.2)
Total Protein: 7 g/dL (ref 6.5–8.1)

## 2016-11-26 LAB — PHOSPHORUS: PHOSPHORUS: 3.5 mg/dL (ref 2.5–4.6)

## 2016-11-26 LAB — APTT: APTT: 31 s (ref 24–36)

## 2016-11-26 LAB — PROTIME-INR
INR: 1.03
Prothrombin Time: 13.5 seconds (ref 11.4–15.2)

## 2016-11-26 LAB — CBG MONITORING, ED: Glucose-Capillary: 84 mg/dL (ref 65–99)

## 2016-11-26 LAB — MAGNESIUM: MAGNESIUM: 1.8 mg/dL (ref 1.7–2.4)

## 2016-11-26 MED ORDER — LORAZEPAM 2 MG/ML IJ SOLN
INTRAMUSCULAR | Status: AC
  Filled 2016-11-26: qty 1

## 2016-11-26 MED ORDER — SODIUM CHLORIDE 0.9 % IV SOLN
1000.0000 mg | INTRAVENOUS | Status: AC
  Administered 2016-11-26: 1000 mg via INTRAVENOUS
  Filled 2016-11-26: qty 10

## 2016-11-26 MED ORDER — SODIUM CHLORIDE 0.9 % IV SOLN
500.0000 mg | Freq: Two times a day (BID) | INTRAVENOUS | Status: DC
  Filled 2016-11-26: qty 5

## 2016-11-26 MED ORDER — SODIUM CHLORIDE 0.9 % IV SOLN
100.0000 mg | Freq: Two times a day (BID) | INTRAVENOUS | Status: DC
  Filled 2016-11-26: qty 10

## 2016-11-26 MED ORDER — SODIUM CHLORIDE 0.9 % IV SOLN
1500.0000 mg | Freq: Two times a day (BID) | INTRAVENOUS | Status: DC
  Filled 2016-11-26 (×2): qty 15

## 2016-11-26 MED ORDER — ENOXAPARIN SODIUM 40 MG/0.4ML ~~LOC~~ SOLN
40.0000 mg | SUBCUTANEOUS | Status: DC
  Administered 2016-11-26: 40 mg via SUBCUTANEOUS
  Filled 2016-11-26: qty 0.4

## 2016-11-26 MED ORDER — SODIUM CHLORIDE 0.9 % IV SOLN
200.0000 mg | INTRAVENOUS | Status: AC
  Administered 2016-11-26: 200 mg via INTRAVENOUS
  Filled 2016-11-26: qty 20

## 2016-11-26 MED ORDER — LEVETIRACETAM 500 MG/5ML IV SOLN
1500.0000 mg | Freq: Two times a day (BID) | INTRAVENOUS | Status: DC
  Administered 2016-11-26: 1500 mg via INTRAVENOUS
  Filled 2016-11-26 (×2): qty 15

## 2016-11-26 MED ORDER — IOPAMIDOL (ISOVUE-370) INJECTION 76%
INTRAVENOUS | Status: AC
  Filled 2016-11-26: qty 50

## 2016-11-26 MED ORDER — LACOSAMIDE 200 MG/20ML IV SOLN
100.0000 mg | Freq: Two times a day (BID) | INTRAVENOUS | Status: DC
  Administered 2016-11-27: 100 mg via INTRAVENOUS
  Filled 2016-11-26: qty 10

## 2016-11-26 NOTE — ED Notes (Signed)
ACTIVATED CODE STROKE WITH CARELINK @ 14:50

## 2016-11-26 NOTE — ED Notes (Signed)
Patient going to MRI and then upstairs.

## 2016-11-26 NOTE — Progress Notes (Signed)
Bedside EEG completed, results pending.  Dr. Lawana Pai notified.

## 2016-11-26 NOTE — Consult Note (Signed)
NEURO HOSPITALIST CONSULT NOTE   Requestig physician: Dr. Tana Coast   Reason for Consult: Code stroke that turned out to be seizure   History obtained from:  Husband  HPI:                                                                                                                                          Ashley [name] is an 41 y.o. female who suffered from a left parietal and parenchymal hemorrhage approximately 18 months ago. At that time she was placed on 1500 mg of Keppra twice a day for prophylaxis. She had never suffered a seizure at that time however. Patient had been doing well for the quite a while. All the last few week she's been noted to have paraphasic errors in addition to having expressive aphasia that was transient but never prolonged. Again she had never had any witnessed seizures. Patient remains on Keppra 1500 mg twice a day she is also possibly on Xanax per husband however I do not see this in her med rec. Patient was brought to the hospital as a code stroke on arrival patient was a phasic. While on the CT scanner patient suddenly had right eye deviation right head deviation right arm flexion right leg flexion and seized for approximately 3 minutes. Patient was given 1 mg of Ativan and slowly came out of her seizure however her right leg continued to shake in a rhythmic fashion. Patient remained expressively a phasic. 1 g of Keppra was loaded and due to her right leg continuing to show rhythmic motion 200 mg of Vimpat was also added.  Past Medical History:  Diagnosis Date  . Anxiety   . ICH (intracerebral hemorrhage) ([place])   . Seizures ([place])   . Stroke [facility])     Past Surgical History:  Procedure Laterality Date  . DILATION AND CURETTAGE OF UTERUS      Family History  Problem Relation Age of Onset  . Cancer Mother   . Cancer Father     pancreatic     Social History:  reports that she has never smoked. She has never used smokeless tobacco. She  reports that she drinks about 0.6 oz of alcohol per week . She reports that she does not use drugs.  No Known Allergies  MEDICATIONS:  Current Facility-Administered Medications  Medication Dose Route Frequency Provider Last Rate Last Dose  . iopamidol (ISOVUE-370) 76 % injection           . lacosamide (VIMPAT) 100 mg in sodium chloride 0.9 % 25 mL IVPB  100 mg Intravenous Q12H Marliss Coots, PA-C      . lacosamide (VIMPAT) 200 mg in sodium chloride 0.9 % 25 mL IVPB  200 mg Intravenous STAT Marliss Coots, PA-C      . levETIRAcetam (KEPPRA) 1,500 mg in sodium chloride 0.9 % 100 mL IVPB  1,500 mg Intravenous Q12H Marliss Coots, PA-C      . LORazepam (ATIVAN) 2 MG/ML injection           . LORazepam (ATIVAN) 2 MG/ML injection            Current Outpatient Prescriptions  Medication Sig Dispense Refill  . aspirin EC 81 MG tablet Take 81 mg by mouth daily.    Marland Kitchen FLUoxetine (PROZAC) 40 MG capsule Take 40 mg by mouth daily.    . hydrOXYzine (ATARAX/VISTARIL) 25 MG tablet Take 25 mg by mouth at bedtime and may repeat dose one time if needed.    . levETIRAcetam (KEPPRA) 750 MG tablet TAKE 2 TABLETS BY MOUTH TWICE A DAY 120 tablet 5  . lurasidone (LATUDA) 40 MG TABS tablet Take 20 mg by mouth daily with supper.    . Multiple Vitamin (MULTIVITAMIN) tablet Take by mouth.    . Sulfamethoxazole-Trimethoprim (BACTRIM PO) Take by mouth.    . TRINTELLIX 20 MG TABS Take 2 tablets by mouth at bedtime.  0   Facility-Administered Medications Ordered in Other Encounters  Medication Dose Route Frequency Provider Last Rate Last Dose  . gadopentetate dimeglumine (MAGNEVIST) injection 12 mL  12 mL Intravenous Once PRN Garvin Fila, MD           ROS:                                                                                                                                       History obtained  from unobtainable from patient due to mental status     Height 5\' 7"  (1.702 m), weight 59 kg (130 lb), not currently breastfeeding.   Neurologic Examination:                                                                                                      HEENT-  Normocephalic, no lesions, without obvious abnormality.  Normal external eye and conjunctiva.  Normal TM's bilaterally.  Normal auditory canals and external ears. Normal external nose, mucus membranes and septum.  Normal pharynx. Cardiovascular- S1, S2 normal, pulses palpable throughout   Lungs- chest clear, no wheezing, rales, normal symmetric air entry Abdomen- normal findings: bowel sounds normal Extremities- no edema Lymph-no adenopathy palpable Musculoskeletal-no joint tenderness, deformity or swelling Skin-warm and dry, no hyperpigmentation, vitiligo, or suspicious lesions  Neurological Examination Mental Status: Alert, expressively a phasic, attempting to follow verbal commands but can only follow visual commands. He remains confused. Cranial Nerves: II: Blinks to threat bilaterally pupils equal, round, reactive to light and accommodation III,IV, VI: ptosis not present, extra-ocular motions intact bilaterally V,VII: smile symmetric, facial light touch sensation normal bilaterally VIII: hearing normal bilaterally IX,X: uvula rises symmetrically XI: bilateral shoulder shrug XII: midline tongue extension Motor: Right : Upper extremity   5/5    Left:     Upper extremity   5/5  Lower extremity   5/5     Lower extremity   5/5 --Right leg is held in extension with rhythmic shaking. Otherwise no significant increased tone throughout. Tone and bulk:normal tone throughout; no atrophy noted Sensory: Pinprick and light touch intact throughout, bilaterally Deep Tendon Reflexes: 2+ and symmetric throughout Plantars: Right: downgoing   Left: downgoing Cerebellar: Unable to obtain secondary to confusion and receptive  aphasia Gait: Not tested      Lab Results: Basic Metabolic Panel:  Recent Labs Lab 11/26/16 1533  NA 135  K 3.8  CL 98*  GLUCOSE 89  BUN 14  CREATININE 0.60    Liver Function Tests: No results for input(s): AST, ALT, ALKPHOS, BILITOT, PROT, ALBUMIN in the last 168 hours. No results for input(s): LIPASE, AMYLASE in the last 168 hours. No results for input(s): AMMONIA in the last 168 hours.  CBC:  Recent Labs Lab 11/26/16 1516 11/26/16 1533  WBC 6.6  --   NEUTROABS 2.9  --   HGB 11.9* 12.6  HCT 36.4 37.0  MCV 93.6  --   PLT 234  --     Cardiac Enzymes: No results for input(s): CKTOTAL, CKMB, CKMBINDEX, TROPONINI in the last 168 hours.  Lipid Panel: No results for input(s): CHOL, TRIG, HDL, CHOLHDL, VLDL, LDLCALC in the last 168 hours.  CBG: No results for input(s): GLUCAP in the last 168 hours.  Microbiology: Results for orders placed or performed during the hospital encounter of 06/04/15  Urine culture     Status: None   Collection Time: 06/22/15 11:04 AM  Result Value Ref Range Status   Specimen Description URINE, CLEAN CATCH  Final   Special Requests NONE  Final   Culture MULTIPLE SPECIES PRESENT, SUGGEST RECOLLECTION  Final   Report Status 06/23/2015 FINAL  Final    Coagulation Studies: No results for input(s): LABPROT, INR in the last 72 hours.  Imaging: Ct Head Code Stroke W/o Cm  Addendum Date: 11/26/2016   ADDENDUM REPORT: 11/26/2016 15:24 ADDENDUM: These results were called by telephone at the time of interpretation on 11/26/2016 at 3:23 pm to Dr. Gilford Raid , who verbally acknowledged these results. Electronically Signed   By: Franchot Gallo M.D.   On: 11/26/2016 15:24   Result Date: 11/26/2016 CLINICAL DATA:  Code stroke.  Expressive aphasia EXAM: CT HEAD WITHOUT CONTRAST TECHNIQUE: Contiguous axial images were obtained from the base of the skull through the vertex without intravenous contrast. COMPARISON:  MRI 09/19/2015, CT 06/02/2015  FINDINGS: Brain: Encephalomalacia in the left medial parietal  lobe over the convexity compatible with an area of prior hemorrhage. Compensatory enlargement of the left lateral ventricle. Negative for hydrocephalus. Negative for acute infarct. Negative for acute hemorrhage or mass. No shift of the midline structures. Vascular: No hyperdense vessel or unexpected calcification. Skull: Negative Sinuses/Orbits: Negative Other: None ASPECTS ([place] Stroke Program Early CT Score) - Ganglionic level infarction (caudate, lentiform nuclei, internal capsule, insula, M1-M3 cortex): 7 - Supraganglionic infarction (M4-M6 cortex): 3 Total score (0-10 with 10 being normal): 10 IMPRESSION: 1. No acute intracranial abnormality. 2. ASPECTS is 10 3. Chronic encephalomalacia left medial parietal lobe related to prior area of hemorrhage. Electronically Signed: By: Franchot Gallo M.D. On: 11/26/2016 15:19       Assessment and plan per attending neurologist  Etta Quill PA-C [facility] [phone]  11/26/2016, 3:41 PM   Assessment/Plan: This is a 41 year old female with history of left parietal and parenchymal hemorrhage proximal 18 months ago. She remains on Keppra 1500 mg twice a day prophylactically but has never had a seizure. Patient has had a history of expressive aphasia but no overt seizures. Today and CT had one witnessed seizure that lasted for approximately 2-3 minutes.  Recommend - Loaded with 1 g of Keppra and continue with 1500 mg of Keppra twice a day - Loaded with 200 mg of Vimpat and continue with 100 mg twice a day of Vimpat - EEG routine if abnormal continue overnight - Seizure precautions

## 2016-11-26 NOTE — ED Notes (Signed)
fsbs 84

## 2016-11-26 NOTE — ED Provider Notes (Signed)
Ashley [name] DEPT Provider Note   CSN: [id number] Arrival date & time: 11/26/16  1449     History   Chief Complaint No chief complaint on file.   HPI Ashley [name] is a 41 y.o. female. Level V caveat secondary to acuity patient HPI  History obtained from husband and chart Ashley [name] is a 41 year old female with a history of intracerebral hemorrhage postpartum who presents today with word finding difficulties. Her husband states that she was in her normal state of health earlier this morning. He went to work and she was able to take the kids to check folate. On his arrival home, her mother stated that she seemed to be having some word finding difficulties. He noted that she began having times when her sentences were garbled interspersed with clear speech. He brought her to the hospital she was able to walk in. She was made a code stroke initially. Neurology was already bedside the time I evaluate the patient. She is in the CT scanner. Nursing reports that she has had what appeared to be a seizure with turning her head to the right and jerking of her full body. Her husband reports that she has been on Keppra 200 mg twice a day since her stroke. He has not witnessed any seizures at home the past 2 years. He states that she has been well recently without any history of recent fever, stress, or sleep deprivation. Per neurology she received Ativan 2 mg and was ordered 1500 mg of Keppra with the initial seizure that they saw prior to my evaluation. Past Medical History:  Diagnosis Date  . Anxiety   . ICH (intracerebral hemorrhage) ([place])   . Seizures ([place])   . Stroke [facility])     Patient Active Problem List   Diagnosis Date Noted  . Spastic hemiplegia and hemiparesis affecting dominant side ([place]) 09/13/2015  . Adhesive capsulitis of right shoulder 08/24/2015  . Symptomatic partial epilepsy with simple partial seizures ([place]) 08/21/2015  . Nontraumatic cortical hemorrhage of cerebral hemisphere ([place])    . Seizure ([place])   . Seizures ([place])   . Adjustment disorder with depressed mood   . Contracture of muscle ankle and foot 06/11/2015  . Hemiplga fol ntrm intcrbl hemor aff right dominant side ([place]) 06/06/2015  . Left-sided intracerebral hemorrhage ([place]) 06/04/2015  . Seizure disorder as sequela of cerebrovascular accident ([place])   . Seizure disorder ([place]) 06/03/2015  . Sepsis ([place]) 06/03/2015  . UTI (urinary tract infection) 06/03/2015  . Hypotension 06/02/2015  . Right spastic hemiparesis ([place]) 05/28/2015  . 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 History of anxiety disorder 05/28/2015  . ICH (intracerebral hemorrhage) ([place])   . Seizure disorder, nonconvulsive, with status epilepticus ([place])   . Cerebral venous thrombosis of cortical vein   . Cytotoxic cerebral edema ([place])   . IVH (intraventricular hemorrhage) ([place])   . Term pregnancy 05/07/2015  . Spontaneous vaginal delivery 05/07/2015    Past Surgical History:  Procedure Laterality Date  . DILATION AND CURETTAGE OF UTERUS      OB History    Gravida Para Term Preterm AB Living   9 3 3   6 3    SAB TAB Ectopic Multiple Live Births   3 2   0 3       Home Medications    Prior to Admission medications   Medication Sig Start Date End Date Taking? Authorizing Provider  aspirin EC 81 MG tablet Take 81 mg by mouth daily.  Historical Provider, MD  FLUoxetine (PROZAC) 40 MG capsule Take 40 mg by mouth daily.    Historical Provider, MD  hydrOXYzine (ATARAX/VISTARIL) 25 MG tablet Take 25 mg by mouth at bedtime and may repeat dose one time if needed.    Historical Provider, MD  levETIRAcetam (KEPPRA) 750 MG tablet TAKE 2 TABLETS BY MOUTH TWICE A DAY 08/11/16   Garvin Fila, MD  lurasidone (LATUDA) 40 MG TABS tablet Take 20 mg by mouth daily with supper.    Historical Provider, MD  Multiple Vitamin (MULTIVITAMIN) tablet Take by mouth.    Historical Provider, MD  Sulfamethoxazole-Trimethoprim  (BACTRIM PO) Take by mouth.    Historical Provider, MD  TRINTELLIX 20 MG TABS Take 2 tablets by mouth at bedtime. 01/07/16   Historical Provider, MD    Family History Family History  Problem Relation Age of Onset  . Cancer Mother   . Cancer Father     pancreatic    Social History Social History  Substance Use Topics  . Smoking status: Never Smoker  . Smokeless tobacco: Never Used  . Alcohol use 0.6 oz/week    1 Glasses of wine per week     Comment: casual      Allergies   Patient has no known allergies.   Review of Systems Review of Systems  Unable to perform ROS: Acuity of condition  Endocrine: Negative.      Physical Exam Updated Vital Signs There were no vitals taken for this visit.  Physical Exam  Constitutional: She appears well-nourished.  HENT:  Head: Normocephalic and atraumatic.  Right Ear: External ear normal.  Left Ear: External ear normal.  Nose: Nose normal.  Mouth/Throat: Oropharynx is clear and moist.  Eyes: EOM are normal. Pupils are equal, round, and reactive to light.  Neck: Normal range of motion.  Cardiovascular: Normal rate, regular rhythm and normal heart sounds.   Pulmonary/Chest: Effort normal and breath sounds normal. No respiratory distress.  Abdominal: Soft. Bowel sounds are normal.  Musculoskeletal: Normal range of motion.  Neurological: She is alert.  Patient initially not verbally responding but during my initial evaluation she began making some statements that appear to be cooperating with our attempts to get her disrobe today. She continued to have shaking of her right leg. The right leg shaking was diffuse in nature and patient was unable to control it.  Skin: Skin is warm. Capillary refill takes less than 2 seconds.  Nursing note and vitals reviewed.    ED Treatments / Results  Labs (all labs ordered are listed, but only abnormal results are displayed) Labs Reviewed  CBC - Abnormal; Notable for the following:        Result Value   Hemoglobin 11.9 (*)    All other components within normal limits  COMPREHENSIVE METABOLIC PANEL - Abnormal; Notable for the following:    Chloride 98 (*)    All other components within normal limits  I-STAT CHEM 8, ED - Abnormal; Notable for the following:    Chloride 98 (*)    Calcium, Ion 1.13 (*)    All other components within normal limits  PROTIME-INR  APTT  DIFFERENTIAL  PHOSPHORUS  MAGNESIUM  I-STAT TROPOININ, ED  CBG MONITORING, ED    EKG  EKG Interpretation None       Radiology No results found.  Procedures Procedures (including critical care time)  Medications Ordered in ED Medications  iopamidol (ISOVUE-370) 76 % injection (not administered)  LORazepam (ATIVAN) 2 MG/ML injection (not  administered)  levETIRAcetam (KEPPRA) 1,000 mg in sodium chloride 0.9 % 100 mL IVPB (not administered)  levETIRAcetam (KEPPRA) 500 mg in sodium chloride 0.9 % 100 mL IVPB (not administered)     Initial Impression / Assessment and Plan / ED Course  I have reviewed the triage vital signs and the nursing notes.  Pertinent labs & imaging results that were available during my care of the patient were reviewed by me and considered in my medical decision making (see chart for details).     This is a 41 year old female with a previous intracerebral hemorrhage who presents today as a code stroke but determined to be seizures. Neurology saw here in ED and ordered additional [place]. This patient's seizures are controlled here. Advised patient can be admitted to hospitalist service.  Final Clinical Impressions(s) / ED Diagnoses   Final diagnoses:  Seizure [facility])    New Prescriptions New Prescriptions   No medications on file     Pattricia Boss, MD 11/26/16 1726

## 2016-11-26 NOTE — ED Triage Notes (Signed)
Patient comes in with seizures. lsw 1330. Hx of head bleed postpartum. Patient now having seizures. Witnessed seizure in CT. Lasting about 30 secs.

## 2016-11-26 NOTE — H&P (Signed)
History and Physical    Richard Keenen [id number] DOB: December 06, 1975 DOA: 11/26/2016  Referring MD/NP/PA: Dr. Jeanell Sparrow  PCP: Marton Redwood, MD   Patient coming from: home  Chief Complaint: altered mental state   HPI: Ashley [name] is a 41 y.o. female with hx left parietal and parenchymal hemorrhage that occured approximately 18 months ago and at time started in 1500 mg of Keppra twice a day for prophylaxis, presented to [facility] ED as a code stroke given aphasia. Per husband at the bedside, he noted expressive aphasia over the past week but episodes mostly intermittent and self resolving. While in ED, pt underwent CT head and was noted to suddenly had right eye deviation right head deviation right arm flexion right leg flexion and seized for approximately 3 minutes. Patient was given 1 mg of Ativan and slowly came out of her seizure however her right leg continued to shake in a rhythmic fashion. Patient remained expressively a phasic. 1 g of Keppra was loaded and due to her right leg continuing to show rhythmic motion 200 mg of Vimpat was also added. Neurology consulted and TRH asked to admit for further evaluation. Pt currently more awake but slow to respond, able to follow most commands but unable to provide more details about current event.    ED Course: Had witness seizure as noted above and neurology consulted for assistance.   Review of Systems:  Unable to obtain due to seizure   Past Medical History:  Diagnosis Date  . Anxiety   . ICH (intracerebral hemorrhage) ([place])   . Seizures ([place])   . Stroke [facility])     Past Surgical History:  Procedure Laterality Date  . DILATION AND CURETTAGE OF UTERUS     Social Hx:  reports that she has never smoked. She has never used smokeless tobacco. She reports that she drinks about 0.6 oz of alcohol per week . She reports that she does not use drugs.  No Known Allergies  Family History  Problem Relation Age of Onset  . Cancer Mother   . Cancer  Father     pancreatic    Prior to Admission medications   Medication Sig Start Date End Date Taking? Authorizing Provider  aspirin EC 81 MG tablet Take 81 mg by mouth daily.    Historical Provider, MD  FLUoxetine (PROZAC) 40 MG capsule Take 40 mg by mouth daily.    Historical Provider, MD  hydrOXYzine (ATARAX/VISTARIL) 25 MG tablet Take 25 mg by mouth at bedtime and may repeat dose one time if needed.    Historical Provider, MD  levETIRAcetam (KEPPRA) 750 MG tablet TAKE 2 TABLETS BY MOUTH TWICE A DAY 08/11/16   Garvin Fila, MD  lurasidone (LATUDA) 40 MG TABS tablet Take 20 mg by mouth daily with supper.    Historical Provider, MD  Multiple Vitamin (MULTIVITAMIN) tablet Take by mouth.    Historical Provider, MD  Sulfamethoxazole-Trimethoprim (BACTRIM PO) Take by mouth.    Historical Provider, MD  TRINTELLIX 20 MG TABS Take 2 tablets by mouth at bedtime. 01/07/16   Historical Provider, MD    Physical Exam: Vitals:   11/26/16 1541  Weight: 59 kg (130 lb)  Height: 5\' 7"  (1.702 m)    Constitutional: NAD, calm, comfortable Vitals:   11/26/16 1541  Weight: 59 kg (130 lb)  Height: 5\' 7"  (1.702 m)   Eyes: PERRL, lids and conjunctivae normal ENMT: Mucous membranes are dry Posterior pharynx clear of any exudate or lesions.Normal dentition.  Neck:  normal, supple, no masses, no thyromegaly Respiratory: clear to auscultation bilaterally, no wheezing, no crackles. Normal respiratory effort. No accessory muscle use.  Cardiovascular: Regular rate and rhythm, no murmurs / rubs / gallops. No extremity edema. 2+ pedal pulses. No carotid bruits.  Abdomen: no tenderness, no masses palpated. No hepatosplenomegaly. Bowel sounds positive.  Musculoskeletal: no clubbing / cyanosis. No joint deformity upper and lower extremities.  Skin: no rashes, lesions, ulcers. No induration Neurologic: alert and oriented to name and place, still slow to respond, some aphasia noted, upper and lower extremity strength  appears to be equal and 5/5 bilaterally  Psychiatric: difficult to assess due to recent seizure   Labs on Admission: I have personally reviewed following labs and imaging studies  CBC:  Recent Labs Lab 11/26/16 1516 11/26/16 1533  WBC 6.6  --   NEUTROABS 2.9  --   HGB 11.9* 12.6  HCT 36.4 37.0  MCV 93.6  --   PLT 234  --    Basic Metabolic Panel:  Recent Labs Lab 11/26/16 1516 11/26/16 1533  NA 137 135  K 3.8 3.8  CL 98* 98*  CO2 26  --   GLUCOSE 91 89  BUN 12 14  CREATININE 0.71 0.60  CALCIUM 9.5  --    Liver Function Tests:  Recent Labs Lab 11/26/16 1516  AST 35  ALT 23  ALKPHOS 50  BILITOT 0.6  PROT 7.0  ALBUMIN 4.6   No results for input(s): LIPASE, AMYLASE in the last 168 hours. No results for input(s): AMMONIA in the last 168 hours. Coagulation Profile:  Recent Labs Lab 11/26/16 1516  INR 1.03   Urine analysis:    Component Value Date/Time   COLORURINE YELLOW 06/22/2015 1104   APPEARANCEUR CLEAR 06/22/2015 1104   LABSPEC 1.003 (L) 06/22/2015 1104   PHURINE 7.0 06/22/2015 1104   GLUCOSEU NEGATIVE 06/22/2015 1104   HGBUR NEGATIVE 06/22/2015 1104   BILIRUBINUR NEGATIVE 06/22/2015 1104   KETONESUR NEGATIVE 06/22/2015 1104   PROTEINUR NEGATIVE 06/22/2015 1104   UROBILINOGEN 0.2 06/22/2015 1104   NITRITE NEGATIVE 06/22/2015 1104   LEUKOCYTESUR NEGATIVE 06/22/2015 1104   Radiological Exams on Admission: Ct Head Code Stroke W/o Cm  Addendum Date: 11/26/2016   ADDENDUM REPORT: 11/26/2016 15:24 ADDENDUM: These results were called by telephone at the time of interpretation on 11/26/2016 at 3:23 pm to Dr. Gilford Raid , who verbally acknowledged these results. Electronically Signed   By: Franchot Gallo M.D.   On: 11/26/2016 15:24   Result Date: 11/26/2016 CLINICAL DATA:  Code stroke.  Expressive aphasia EXAM: CT HEAD WITHOUT CONTRAST TECHNIQUE: Contiguous axial images were obtained from the base of the skull through the vertex without intravenous  contrast. COMPARISON:  MRI 09/19/2015, CT 06/02/2015 FINDINGS: Brain: Encephalomalacia in the left medial parietal lobe over the convexity compatible with an area of prior hemorrhage. Compensatory enlargement of the left lateral ventricle. Negative for hydrocephalus. Negative for acute infarct. Negative for acute hemorrhage or mass. No shift of the midline structures. Vascular: No hyperdense vessel or unexpected calcification. Skull: Negative Sinuses/Orbits: Negative Other: None ASPECTS ([place] Stroke Program Early CT Score) - Ganglionic level infarction (caudate, lentiform nuclei, internal capsule, insula, M1-M3 cortex): 7 - Supraganglionic infarction (M4-M6 cortex): 3 Total score (0-10 with 10 being normal): 10 IMPRESSION: 1. No acute intracranial abnormality. 2. ASPECTS is 10 3. Chronic encephalomalacia left medial parietal lobe related to prior area of hemorrhage. Electronically Signed: By: Franchot Gallo M.D. On: 11/26/2016 15:19    EKG: pending  Assessment/Plan Active Problems:   Seizure (HCC) - Loaded with 1 g of Keppra and continue with 1500 mg of Keppra twice a day - Loaded with 200 mg of Vimpat and continue with 100 mg twice a day of Vimpat - EEG routine if abnormal continue overnight - Seizure precautions - admit to telemetry - appreciate neurology team following - SLP eval, advance diet as pt able to tolerate     DVT prophylaxis: Lovenox  Code Status: Full  Family Communication: Pt and husband updated at bedside Disposition Plan: admit to inpatient tele unit Consults called: Neurology  Admission status: Inpatient   Faye Ramsay MD [facility] Pager [phone]  If 7PM-7AM, please contact night-coverage [email or web address] Password [facility]  11/26/2016, 4:09 PM

## 2016-11-27 DIAGNOSIS — R569 Unspecified convulsions: Secondary | ICD-10-CM

## 2016-11-27 LAB — BASIC METABOLIC PANEL
ANION GAP: 7 (ref 5–15)
BUN: 12 mg/dL (ref 6–20)
CALCIUM: 8.5 mg/dL — AB (ref 8.9–10.3)
CO2: 26 mmol/L (ref 22–32)
CREATININE: 0.61 mg/dL (ref 0.44–1.00)
Chloride: 105 mmol/L (ref 101–111)
Glucose, Bld: 83 mg/dL (ref 65–99)
Potassium: 4.1 mmol/L (ref 3.5–5.1)
SODIUM: 138 mmol/L (ref 135–145)

## 2016-11-27 LAB — CBC
HEMATOCRIT: 32.7 % — AB (ref 36.0–46.0)
Hemoglobin: 10.6 g/dL — ABNORMAL LOW (ref 12.0–15.0)
MCH: 30.4 pg (ref 26.0–34.0)
MCHC: 32.4 g/dL (ref 30.0–36.0)
MCV: 93.7 fL (ref 78.0–100.0)
PLATELETS: 195 10*3/uL (ref 150–400)
RBC: 3.49 MIL/uL — ABNORMAL LOW (ref 3.87–5.11)
RDW: 12.5 % (ref 11.5–15.5)
WBC: 4.4 10*3/uL (ref 4.0–10.5)

## 2016-11-27 LAB — HIV ANTIBODY (ROUTINE TESTING W REFLEX): HIV SCREEN 4TH GENERATION: NONREACTIVE

## 2016-11-27 MED ORDER — LACOSAMIDE 50 MG PO TABS: 100.0000 mg | ORAL_TABLET | Freq: Two times a day (BID) | ORAL | Status: DC

## 2016-11-27 MED ORDER — SODIUM CHLORIDE 0.9 % IV BOLUS (SEPSIS)
500.0000 mL | Freq: Once | INTRAVENOUS | Status: AC
  Administered 2016-11-27: 500 mL via INTRAVENOUS

## 2016-11-27 MED ORDER — LACOSAMIDE 100 MG PO TABS: 100.0000 mg | ORAL_TABLET | Freq: Two times a day (BID) | ORAL | 0 refills | Status: DC

## 2016-11-27 MED ORDER — LEVETIRACETAM 750 MG PO TABS: 1500.0000 mg | ORAL_TABLET | Freq: Two times a day (BID) | ORAL | Status: DC

## 2016-11-27 NOTE — Procedures (Signed)
ELECTROENCEPHALOGRAM REPORT  Date of Study: 02/14/20188  Patient's Name: Ashley [name] MRN: [id number] Date of Birth: December 23, 1975  Referring Provider: Loralee Pacas. Tamala Julian, PA-C  Clinical History: 41 year old female with prior left parietal parenchymal hemorrhage presents with aphasia and seizure.  Medications: Lacosamide Levetiracetam  Technical Summary: A multichannel digital EEG recording measured by the international 10-20 system with electrodes applied with paste and impedances below 5000 ohms performed in our laboratory with EKG monitoring in an awake and asleep patient.  Hyperventilation and photic stimulation were not performed.  The digital EEG was referentially recorded, reformatted, and digitally filtered in a variety of bipolar and referential montages for optimal display.    Description: The patient is awake and asleep during the recording.  During maximal wakefulness, there is a symmetric, medium voltage 8 Hz posterior dominant rhythm that attenuates with eye opening.  There is very mild intermittent left hemispheric slowing.  During drowsiness and sleep, there is an increase in theta slowing of the background.  Vertex waves and symmetric sleep spindles were seen.  here were no epileptiform discharges or electrographic seizures seen.    EKG lead was unremarkable.  Impression: This awake and asleep EEG is abnormal due to very mild left hemispheric slowing.  Clinical Correlation: The above findings are indicative of a structural or other physiologic abnormality in the left hemisphere.  There is no evidence of epileptiform activity.  Clinical correlation advised.   Metta Clines, DO

## 2016-11-27 NOTE — Discharge Summary (Signed)
Physician Discharge Summary  Ashley [name] [id number] DOB: [birth year] DOA: 11/26/2016  PCP: Marton Redwood, MD  Admit date: 11/26/2016 Discharge date: 11/27/2016  Admitted From: Home  Disposition:  Home   Recommendations for Outpatient Follow-up:  1. Follow up with PCP in 1-2 weeks 2. Please obtain BMP/CBC in one week 3. Needs to follow up with primary neurology     Discharge Condition: Stable.  CODE STATUS: Full code.  Diet recommendation: Heart Healthy   Brief/Interim Summary: Ashley [name] is a 41 y.o. female with hx left parietal and parenchymal hemorrhage that occured approximately 18 months ago and at time started in 1500 mg of Keppra twice a day for prophylaxis, presented to [facility] ED as a code stroke given aphasia. Per husband at the bedside, he noted expressive aphasia over the past week but episodes mostly intermittent and self resolving. While in ED, pt underwent CT head and was noted to suddenly had right eye deviation right head deviation right arm flexion right leg flexion and seized for approximately 3 minutes. Patient was given 1 mg of Ativan and slowly came out of her seizure however her right leg continued to shake in a rhythmic fashion. Patient remained expressively a phasic. 1 g of Keppra was loaded and due to her right leg continuing to show rhythmic motion 200 mg of Vimpat was also added. Neurology consulted and TRH asked to admit for further evaluation. Pt currently more awake but slow to respond, able to follow most commands but unable to provide more details about current event.    Discharge Diagnoses:  Active Problems:   Seizure ([place])  1-Seizure;  Patient presents with seizure episode.  She was load with Keppra and vimpat.  MRI brain was negative for acute stroke, showed encephalomalacia.  Patient stable. No further episodes. She is feeling well.  Per neuro ok to discharge patient on Keppra and Vimpat.   Discharge Instructions  Discharge Instructions     Ambulatory referral to Neurology    Complete by:  As directed    An appointment is requested in approximately: 2 weeks for seizure and medications reconciliation   Diet - low sodium heart healthy    Complete by:  As directed    Increase activity slowly    Complete by:  As directed      Allergies as of 11/27/2016   No Known Allergies     Medication List    STOP taking these medications   aspirin EC 81 MG tablet     TAKE these medications   FLUoxetine 40 MG capsule Commonly known as:  PROZAC Take 40 mg by mouth daily.   Lacosamide 100 MG Tabs Take 1 tablet (100 mg total) by mouth 2 (two) times daily.   levETIRAcetam 750 MG tablet Commonly known as:  KEPPRA TAKE 2 TABLETS BY MOUTH TWICE A DAY   multivitamin tablet Take 1 tablet by mouth daily.   sulfamethoxazole-trimethoprim 800-160 MG tablet Commonly known as:  BACTRIM DS,SEPTRA DS Take 1 tablet by mouth 2 (two) times daily.       No Known Allergies  Consultations:  Neurology    Procedures/Studies: Mr Brain 3 Contrast  Result Date: 11/26/2016 CLINICAL DATA:  41 y/o F; seizure. History of intracranial hemorrhage. EXAM: MRI HEAD WITHOUT CONTRAST TECHNIQUE: Multiplanar, multiecho pulse sequences of the brain and surrounding structures were obtained without intravenous contrast. COMPARISON:  11/26/2016 CT head.  09/19/2015 MRI head. FINDINGS: Brain: Left paramedian parietal encephalomalacia with extensive hemosiderin staining extending into the splenium of corpus  callosum. No diffusion signal abnormality. No new focal mass effect, acute intracranial hemorrhage, or extra-axial collection. No hydrocephalus. Ex vacuo dilatation of left lateral ventricle occipital horn. Additionally, there is increased side of the temporal horn of the left lateral ventricle which may be due to decreased volume of the hippocampus. No heterotopia, disorder of cortical formation, or other gross structural abnormality of the brain. Complete  corpus callosum and vermis. Morphologically normal pituitary gland. The left hippocampus is smaller in size and has increased signal in comparison with the right hippocampus (series 9, image 15 and series 11, image 14). Vascular: Normal flow voids. Skull and upper cervical spine: Normal marrow signal. Sinuses/Orbits: Negative. Other: None. IMPRESSION: 1. Left paramedian parietal encephalomalacia with extensive hemosiderin staining from prior hemorrhage. 2. No evidence of acute/early subacute infarct, acute hemorrhage, or focal mass effect. 3. Left hippocampus is smaller in size and has increased signal in comparison with the right hippocampus which may represent mesial temporal sclerosis or possibly atrophic changes related to the large region of left parietal encephalomalacia. Electronically Signed   By: Kristine Garbe M.D.   On: 11/26/2016 19:36   Ct Head Code Stroke W/o Cm  Addendum Date: 11/26/2016   ADDENDUM REPORT: 11/26/2016 15:24 ADDENDUM: These results were called by telephone at the time of interpretation on 11/26/2016 at 3:23 pm to Dr. Gilford Raid , who verbally acknowledged these results. Electronically Signed   By: Franchot Gallo M.D.   On: 11/26/2016 15:24   Result Date: 11/26/2016 CLINICAL DATA:  Code stroke.  Expressive aphasia EXAM: CT HEAD WITHOUT CONTRAST TECHNIQUE: Contiguous axial images were obtained from the base of the skull through the vertex without intravenous contrast. COMPARISON:  MRI 09/19/2015, CT 06/02/2015 FINDINGS: Brain: Encephalomalacia in the left medial parietal lobe over the convexity compatible with an area of prior hemorrhage. Compensatory enlargement of the left lateral ventricle. Negative for hydrocephalus. Negative for acute infarct. Negative for acute hemorrhage or mass. No shift of the midline structures. Vascular: No hyperdense vessel or unexpected calcification. Skull: Negative Sinuses/Orbits: Negative Other: None ASPECTS ([place] Stroke Program Early CT  Score) - Ganglionic level infarction (caudate, lentiform nuclei, internal capsule, insula, M1-M3 cortex): 7 - Supraganglionic infarction (M4-M6 cortex): 3 Total score (0-10 with 10 being normal): 10 IMPRESSION: 1. No acute intracranial abnormality. 2. ASPECTS is 10 3. Chronic encephalomalacia left medial parietal lobe related to prior area of hemorrhage. Electronically Signed: By: Franchot Gallo M.D. On: 11/26/2016 15:19       Subjective: Feeling well, only has mild headaches.    Discharge Exam: Vitals:   11/27/16 0158 11/27/16 0520  BP: (!) 86/58 (!) 114/98  Pulse:    Resp:  16  Temp:  98.2 F (36.8 C)   Vitals:   11/26/16 2044 11/27/16 0037 11/27/16 0158 11/27/16 0520  BP: 95/62 (!) 84/42 (!) 86/58 (!) 114/98  Pulse: 71 74    Resp: 18 18  16   Temp: 98.4 F (36.9 C)   98.2 F (36.8 C)  TempSrc: Oral   Oral  SpO2: 100% 98%  98%  Weight: 54.9 kg (121 lb)     Height: 5\' 3"  (1.6 m)       General: Pt is alert, awake, not in acute distress Cardiovascular: RRR, S1/S2 +, no rubs, no gallops Respiratory: CTA bilaterally, no wheezing, no rhonchi Abdominal: Soft, NT, ND, bowel sounds + Extremities: no edema, no cyanosis    The results of significant diagnostics from this hospitalization (including imaging, microbiology, ancillary and laboratory) are listed below  for reference.     Microbiology: No results found for this or any previous visit (from the past 240 hour(s)).   Labs: BNP (last 3 results) No results for input(s): BNP in the last 8760 hours. Basic Metabolic Panel:  Recent Labs Lab 11/26/16 1516 11/26/16 1531 11/26/16 1533 11/27/16 0430  NA 137  --  135 138  K 3.8  --  3.8 4.1  CL 98*  --  98* 105  CO2 26  --   --  26  GLUCOSE 91  --  89 83  BUN 12  --  14 12  CREATININE 0.71  --  0.60 0.61  CALCIUM 9.5  --   --  8.5*  MG  --  1.8  --   --   PHOS  --  3.5  --   --    Liver Function Tests:  Recent Labs Lab 11/26/16 1516  AST 35  ALT 23  ALKPHOS  50  BILITOT 0.6  PROT 7.0  ALBUMIN 4.6   No results for input(s): LIPASE, AMYLASE in the last 168 hours. No results for input(s): AMMONIA in the last 168 hours. CBC:  Recent Labs Lab 11/26/16 1516 11/26/16 1533 11/27/16 0430  WBC 6.6  --  4.4  NEUTROABS 2.9  --   --   HGB 11.9* 12.6 10.6*  HCT 36.4 37.0 32.7*  MCV 93.6  --  93.7  PLT 234  --  195   Cardiac Enzymes: No results for input(s): CKTOTAL, CKMB, CKMBINDEX, TROPONINI in the last 168 hours. BNP: Invalid input(s): POCBNP CBG:  Recent Labs Lab 11/26/16 1522  GLUCAP 84   D-Dimer No results for input(s): DDIMER in the last 72 hours. Hgb A1c No results for input(s): HGBA1C in the last 72 hours. Lipid Profile No results for input(s): CHOL, HDL, LDLCALC, TRIG, CHOLHDL, LDLDIRECT in the last 72 hours. Thyroid function studies No results for input(s): TSH, T4TOTAL, T3FREE, THYROIDAB in the last 72 hours.  Invalid input(s): FREET3 Anemia work up No results for input(s): VITAMINB12, FOLATE, FERRITIN, TIBC, IRON, RETICCTPCT in the last 72 hours. Urinalysis    Component Value Date/Time   COLORURINE YELLOW 06/22/2015 1104   APPEARANCEUR CLEAR 06/22/2015 1104   LABSPEC 1.003 (L) 06/22/2015 1104   PHURINE 7.0 06/22/2015 1104   GLUCOSEU NEGATIVE 06/22/2015 1104   HGBUR NEGATIVE 06/22/2015 1104   BILIRUBINUR NEGATIVE 06/22/2015 1104   KETONESUR NEGATIVE 06/22/2015 1104   PROTEINUR NEGATIVE 06/22/2015 1104   UROBILINOGEN 0.2 06/22/2015 1104   NITRITE NEGATIVE 06/22/2015 1104   LEUKOCYTESUR NEGATIVE 06/22/2015 1104   Sepsis Labs Invalid input(s): PROCALCITONIN,  WBC,  LACTICIDVEN Microbiology No results found for this or any previous visit (from the past 240 hour(s)).   Time coordinating discharge: Over 30 minutes  SIGNED:   Elmarie Shiley, MD  [facility] 11/27/2016, 10:33 AM Pager   If 7PM-7AM, please contact night-coverage [email or web address] Password [email or web address]

## 2016-11-27 NOTE — Progress Notes (Signed)
Pt ready for discharge home. Pt. Is alert and oriented. Pt is hemodynamically stable. AVS reviewed with pt. Capable of re verbalizing medication regimen. Discharge plan appropriate and in place.

## 2016-11-27 NOTE — Progress Notes (Signed)
Subjective: No further seizures and handling Keppra along with Vimpat well.   Exam: Vitals:   11/27/16 0158 11/27/16 0520  BP: (!) 86/58 (!) 114/98  Pulse:    Resp:  16  Temp:  98.2 F (36.8 C)        Gen: In bed, NAD MS: Alert and oriented CN: 2-12 intact Motor: MAEW Sensory: intact throughout   Pertinent Labs/Diagnostics: MRI and EEG shows no acute pathology or seizures.   Etta Quill PA-C [facility] [phone]  Impression: new onset seizure in setting of previous old left parietal hemorrhage. Continued KEppra 1500 mg BID and added 100 mg Vimpat BID. Continue current AED and made out patient referral to Dr. Delice Lesch for out patient follow up.    Per [facility] statutes, patients with seizures are not allowed to drive until  they have been seizure-free for six months. Use caution when using heavy equipment or power tools. Avoid working on ladders or at heights. Take showers instead of baths. Ensure the water temperature is not too high on the home water heater. Do not go swimming alone. When caring for infants or small children, sit down when holding, feeding, or changing them to minimize risk of injury to the child in the event you have a seizure.      11/27/2016, 10:00 AM

## 2016-11-27 NOTE — Progress Notes (Signed)
BP 84/42 dinamap and 86/58 manual; Lamar Blinks, NP paged and notified; orders given.

## 2016-11-27 NOTE — Care Management Note (Signed)
Case Management Note  Patient Details  Name: Peta Lacki MRN: [id number] Date of Birth: 1976-08-16  Subjective/Objective:                    Action/Plan: Pt discharging home with self care. Pt received 14 day free coupon for Vimpat with separate prescription from MD. CM explained to pt and husband the use of the coupon and separate prescriptions. Pt has insurance and a PCP and transportation home.    Expected Discharge Date:  11/27/16               Expected Discharge Plan:  Home/Self Care  In-House Referral:     Discharge planning Services  CM Consult  Post Acute Care Choice:    Choice offered to:     DME Arranged:    DME Agency:     HH Arranged:    HH Agency:     Status of Service:  Completed, signed off  If discussed at [employer] of Stay Meetings, dates discussed:    Additional Comments:  Pollie Friar, RN 11/27/2016, 11:47 AM

## 2016-11-27 NOTE — Evaluation (Signed)
Clinical/Bedside Swallow Evaluation Patient Details  Name: Ashley [name] MRN: [id number] Date of Birth: 1976-03-27  Today's Date: 11/27/2016 Time: SLP Start Time (ACUTE ONLY): [id number] SLP Stop Time (ACUTE ONLY): 0928 SLP Time Calculation (min) (ACUTE ONLY): 12 min  Past Medical History:  Past Medical History:  Diagnosis Date  . Anxiety   . ICH (intracerebral hemorrhage) ([place])   . Seizures ([place])   . Stroke [facility])    Past Surgical History:  Past Surgical History:  Procedure Laterality Date  . DILATION AND CURETTAGE OF UTERUS     HPI:  Ashley [name] an 41 y.o.femalewho suffered from a left parietal and parenchymal hemorrhage approximately 18 months ago. At that time she was placed on 1500 mg of Keppra twice a day for prophylaxis. Patient was brought to the hospital as a code stroke on arrival patient was a phasic. While on the CT scanner patient suddenly had right eye deviation right head deviation right arm flexion right leg flexion and seized for approximately 3 minutes. Patient was given 1 mg of Ativan and slowly came out of her seizure however her right leg continued to shake in a rhythmic fashion. Patient remained expressively aphasic.  MRI showed no evidence of acute/subacute infarct   Assessment / Plan / Recommendation Clinical Impression  Patient presents with oropharyngeal swallow appearing within functional limits at bedside, with no overt signs of aspiration or reduced airway protection. Oral manipulation adequate, swallow appears timely, vocal quality is clear upon phonation. Patient states she has been tolerating current diet without difficulty. Pt states wordfinding and expressive language deficits which were present at admission have resolved and she feels she is at her baseline level of function with regard to swallowing and cognitive-communication function. Recommend continuing regular diet with thin liquids, medications whole with liquid. No further SLP needs identified at  this time, SLP will s/o.      Aspiration Risk  No limitations    Diet Recommendation Regular;Thin liquid   Liquid Administration via: Cup;Straw Medication Administration: Whole meds with liquid Supervision: Patient able to self feed Compensations: Slow rate;Small sips/bites Postural Changes: Seated upright at 90 degrees    Other  Recommendations Oral Care Recommendations: Oral care BID   Follow up Recommendations None      Frequency and Duration            Prognosis Prognosis for Safe Diet Advancement: Good      Swallow Study   General Date of Onset: 11/26/16 HPI: Ashley [name] an 41 y.o.femalwho suffered from a left parietal and parenchymal hemorrhage approximately 18 months ago. At that time she was placed on 1500 mg of Keppra twice a day for prophylaxis. Patient was brought to the hospital as a code stroke on arrival patient was a phasic. While on the CT scanner patient suddenly had right eye deviation right head deviation right arm flexion right leg flexion and seized for approximately 3 minutes. Patient was given 1 mg of Ativan and slowly came out of her seizure however her right leg continued to shake in a rhythmic fashion. Patient remained expressively aphasic.  MRI showed no evidence of acute/subacute infarct Type of Study: Bedside Swallow Evaluation Previous Swallow Assessment: Pt seen by SLP following CVA in 2016, discharged from CIR on regular diet with thin liquids, seen at [facility] for cognitive-linguistic treaetment only Diet Prior to this Study: Regular;Thin liquids Temperature Spikes Noted: No Respiratory Status: Room air History of Recent Intubation: No Behavior/Cognition: Alert;Cooperative Oral Cavity Assessment: Within Functional Limits Oral Care Completed by  SLP: No Oral Cavity - Dentition: Adequate natural dentition Vision: Functional for self-feeding Self-Feeding Abilities: Able to feed self Patient Positioning: Upright in bed Baseline Vocal  Quality: Normal Volitional Cough: Strong Volitional Swallow: Able to elicit    Oral/Motor/Sensory Function Overall Oral Motor/Sensory Function: Within functional limits   Ice Chips Ice chips: Not tested   Thin Liquid Thin Liquid: Within functional limits Presentation: Cup;Straw;Self Fed    Nectar Thick Nectar Thick Liquid: Not tested   Honey Thick Honey Thick Liquid: Not tested   Puree Puree: Within functional limits Presentation: Self Fed;Spoon   Solid   GO   Solid: Within functional limits Presentation: [place], [place] CF-SLP Speech-Language Pathologist [phone]  Aliene Altes 11/27/2016,10:29 AM

## 2016-11-27 NOTE — Plan of Care (Signed)
Problem: Education: Goal: Knowledge of [place] [facility] Education information/materials will improve Outcome: Progressing POC reviewed with pt. and husband.

## 2016-12-02 ENCOUNTER — Ambulatory Visit: Payer: BLUE CROSS/BLUE SHIELD | Admitting: Neurology

## 2016-12-10 ENCOUNTER — Encounter: Payer: Self-pay | Admitting: Neurology

## 2016-12-10 ENCOUNTER — Ambulatory Visit (INDEPENDENT_AMBULATORY_CARE_PROVIDER_SITE_OTHER): Payer: BLUE CROSS/BLUE SHIELD | Admitting: Neurology

## 2016-12-10 VITALS — BP 96/68 | HR 77 | Temp 99.0°F | Resp 16 | Ht 64.0 in | Wt 120.7 lb

## 2016-12-10 DIAGNOSIS — G40109 Localization-related (focal) (partial) symptomatic epilepsy and epileptic syndromes with simple partial seizures, not intractable, without status epilepticus: Secondary | ICD-10-CM | POA: Diagnosis not present

## 2016-12-10 DIAGNOSIS — I69359 Hemiplegia and hemiparesis following cerebral infarction affecting unspecified side: Secondary | ICD-10-CM

## 2016-12-10 MED ORDER — LACOSAMIDE 100 MG PO TABS: 100.0000 mg | ORAL_TABLET | Freq: Two times a day (BID) | ORAL | 5 refills | Status: DC

## 2016-12-10 MED ORDER — LEVETIRACETAM 750 MG PO TABS: 1500.0000 mg | ORAL_TABLET | Freq: Two times a day (BID) | ORAL | 5 refills | Status: DC

## 2016-12-10 MED ORDER — LORAZEPAM 1 MG PO TABS: ORAL_TABLET | ORAL | 3 refills | Status: DC

## 2016-12-10 NOTE — Patient Instructions (Signed)
1. Continue Keppra 1500mg  twice a day and Vimpat 100mg  twice a day 2. Take Ativan 1mg  as needed if you are having back to back seizures 3. Keep a calendar of your symptoms 4. Continue to monitor mood 5. As per [place] driving laws, no driving until 6 months seizure-free  Seizure Precautions: 1. If medication has been prescribed for you to prevent seizures, take it exactly as directed.  Do not stop taking the medicine without talking to your doctor first, even if you have not had a seizure in a long time.   2. Avoid activities in which a seizure would cause danger to yourself or to others.  Don't operate dangerous machinery, swim alone, or climb in high or dangerous places, such as on ladders, roofs, or girders.  Do not drive unless your doctor says you may.  3. If you have any warning that you may have a seizure, lay down in a safe place where you can't hurt yourself.    4.  No driving for 6 months from last seizure, as per [facility].   Please refer to the following link on the [place] website for more information: http://www.epilepsyfoundation.org/answerplace/Social/driving/drivingu.cfm   5.  Maintain good sleep hygiene. Avoid alcohol.  6.  Notify your neurology if you are planning pregnancy or if you become pregnant.  7.  Contact your doctor if you have any problems that may be related to the medicine you are taking.  8.  Call 911 and bring the patient back to the ED if:        A.  The seizure lasts longer than 5 minutes.       B.  The patient doesn't awaken shortly after the seizure  C.  The patient has new problems such as difficulty seeing, speaking or moving  D.  The patient was injured during the seizure  E.  The patient has a temperature over 102 F (39C)  F.  The patient vomited and now is having trouble breathing

## 2016-12-10 NOTE — Progress Notes (Signed)
NEUROLOGY CONSULTATION NOTE  Ashley [name] MRN: [id number] DOB: Nov 26, 1975  Referring provider: Etta Quill, PA-C Primary care provider: Dr. Marton Redwood  Reason for consult:  Hospital follow-up for seizure  Thank you for your kind referral of Ashley [name] for consultation of the above symptoms. Although her history is well known to you, please allow me to reiterate it for the purpose of our medical record. The patient was accompanied to the clinic by her husband who also provides collateral information. Records and images were personally reviewed where available.  HISTORY OF PRESENT ILLNESS: This is a pleasant 41 year old right-handed woman with a history of left parietal ICH in 2016 with residual right foot drop, presenting after hospital admission from February 14-15, 2018 for focal seizures. She had focal seizures in 2016 with EEG showing left PLEDs and has been on Keppra 1500mg  BID since then with no further focal motor seizures, however she would have occasional episodes around once a month where she could not get her words out, lasting 3-4 minutes. Sometimes she would get hot and have to sit but her speech would not be affected. On 11/27/16, she started having the same speech difficulties where she was making paraphasic errors, but it continued for 30 minutes and they decided to go to the ER. In the ER, she was noted to be aphasic, then as she was having a head CT, she suddenly had right gaze and head deviation, right arm and leg flexion and shaking for around 3 minutes. She was given IV Ativan but her right leg continued to shake in a rhythmic fashion. She was given additional Keppra and started on Vimpat with no further seizures, back to baseline in a couple of hours. She initially had some difficulties with the Vimpat 100mg  BID where she had hallucinations of hearing music or voices, but these have resolved over the past week. She denies any side effects on Keppra 1500mg  BID. She denies  any dizziness, diplopia, gait instability. She has a right foot drop, denies any numbness/tingling on the right side but states it feels different that the other side. She denies any olfactory/gustatory hallucinations, myoclonic jerks, episodes of staring/unresponsiveness, gaps in time. She has not had any further episodes of paraphasic errors since her hospitalization. She had a mild diffuse headache for a couple of days but this has resolved. She denies any dizziness, dysarthria/dysphagia, neck/back pain, bowel/bladder dysfunction.  Records from her hospitalization in August 2016 were reviewed. She has been seeing neurologist Dr. Leonie Man for post-stroke and seizure care since then, last visit was in August 2017. She had a severe headache for several days in August 2016 42 years post-partum. She then developed right-sided weakness, aphasia, then a focal seizure in the hospital. She was found to have a large left parietal ICH with IVH, cerebral edema, obstructive hydrocephalus and subarachnoid hemorrhage. MRA and MRV were unremarkable. Concern was for cerebral cortical vein thrombosis not seen on MRV, however RCVS was also a consideration. She was treated with hypertonic saline and stabilized. She had a seizure with EEG showing left PLEDs. She was started on Keppra. Hypercoagulable labs were normal. She was discharged to rehab and had another seizure on 06/01/16 with report of eyes rolling back and upper extremities shaking. Keppra dose was increased. She had a repeat MRI brain in September 2016 with resolving subacute left parietal hematoma. There was acute gyral swelling and T2 hyperintensity in the left hippocampal formation, left insula, and operculum, that resolved with repeat MRI 09/2015 showing  resolution of these changes. She was initially having some headaches and low dose Topamax was briefly added, then with resolution of headaches, this was tapered off. She had been dealing with post-stroke depression and  symptoms had improved, however with limitations on driving again, she is starting to feel depressed again. She has three young children, the youngest is 70 months old. She was not happy with neuro rehab and has a private physical therapist coming weekly where she pushes herself and has made a ton of improvement. She does not like to wear her AFO and her husband did not notice any difference in gait with the AFO.   Epilepsy Risk Factors:  History of large left parietal hematoma with encephalomalacia. Otherwise she had a normal birth and early development.  There is no history of febrile convulsions, CNS infections such as meningitis/encephalitis, significant traumatic brain injury, neurosurgical procedures, or family history of seizures.  Diagnostic Data: I personally reviewed MRI brain 11/26/2016 which did not show any acute changes. There was left paramedian parietal encephalomalacia with extensive hemosiderin staining extending into the splenium of the corpus callosum, ex vacuo dilatation of the left lateral ventricle occipital horn, increased side of the temporal horn of the left lateral ventricle which may be due to decreased volume of the hippocampus. It is smaller in size with increased signal compared to the right. EEG 11/27/16 showed mild left hemispheric slowing EEG 05/22/15: left hemisphere slowing, rare epileptiform discharges over the left anterior temporal region EEG 05/21/15: PLEDs on the left hemisphere, mainly involving the temporo-parietal region  Prior AEDs: Topamax  PAST MEDICAL HISTORY: Past Medical History:  Diagnosis Date  . Anxiety   . ICH (intracerebral hemorrhage) ([place])   . Seizures ([place])   . Stroke [facility])     PAST SURGICAL HISTORY: Past Surgical History:  Procedure Laterality Date  . DILATION AND CURETTAGE OF UTERUS      MEDICATIONS: Current Outpatient Prescriptions on File Prior to Visit  Medication Sig Dispense Refill  . FLUoxetine (PROZAC) 40 MG capsule Take 40 mg  by mouth daily.    . Lacosamide 100 MG TABS Take 1 tablet (100 mg total) by mouth 2 (two) times daily. 28 tablet 0  . levETIRAcetam (KEPPRA) 750 MG tablet TAKE 2 TABLETS BY MOUTH TWICE A DAY 120 tablet 5  . Multiple Vitamin (MULTIVITAMIN) tablet Take 1 tablet by mouth daily.      Current Facility-Administered Medications on File Prior to Visit  Medication Dose Route Frequency Provider Last Rate Last Dose  . gadopentetate dimeglumine (MAGNEVIST) injection 12 mL  12 mL Intravenous Once PRN Garvin Fila, MD        ALLERGIES: No Known Allergies  FAMILY HISTORY: Family History  Problem Relation Age of Onset  . Cancer Mother   . Cancer Father     pancreatic    SOCIAL HISTORY: Social History   Social History  . Marital status: Married    Spouse name: N/A  . Number of children: N/A  . Years of education: N/A   Occupational History  . Not on file.   Social History Main Topics  . Smoking status: Never Smoker  . Smokeless tobacco: Never Used  . Alcohol use 0.6 oz/week    1 Glasses of wine per week     Comment: casual   . Drug use: No  . Sexual activity: Not on file   Other Topics Concern  . Not on file   Social History Narrative  . No narrative on file  REVIEW OF SYSTEMS: Constitutional: No fevers, chills, or sweats, no generalized fatigue, change in appetite Eyes: No visual changes, double vision, eye pain Ear, nose and throat: No hearing loss, ear pain, nasal congestion, sore throat Cardiovascular: No chest pain, palpitations Respiratory:  No shortness of breath at rest or with exertion, wheezes GastrointestinaI: No nausea, vomiting, diarrhea, abdominal pain, fecal incontinence Genitourinary:  No dysuria, urinary retention or frequency Musculoskeletal:  No neck pain, back pain Integumentary: No rash, pruritus, skin lesions Neurological: as above Psychiatric: + depression, no insomnia, anxiety Endocrine: No palpitations, fatigue, diaphoresis, mood swings, change  in appetite, change in weight, increased thirst Hematologic/Lymphatic:  No anemia, purpura, petechiae. Allergic/Immunologic: no itchy/runny eyes, nasal congestion, recent allergic reactions, rashes  PHYSICAL EXAM: Vitals:   12/10/16 0856  BP: 96/68  Pulse: 77  Resp: 16  Temp: 99 F (37.2 C)   General: No acute distress Head:  Normocephalic/atraumatic Eyes: Fundoscopic exam shows bilateral sharp discs, no vessel changes, exudates, or hemorrhages Neck: supple, no paraspinal tenderness, full range of motion Back: No paraspinal tenderness Heart: regular rate and rhythm Lungs: Clear to auscultation bilaterally. Vascular: No carotid bruits. Skin/Extremities: No rash, no edema Neurological Exam: Mental status: alert and oriented to person, place, and time, no dysarthria or aphasia, Fund of knowledge is appropriate.  Recent and remote memory are intact.2/3 delayed recall. Attention and concentration are normal.    Able to name objects and repeat phrases. Cranial nerves: CN I: not tested CN II: pupils equal, round and reactive to light, visual fields intact, fundi unremarkable. CN III, IV, VI:  full range of motion, no nystagmus, no ptosis CN V: decreased on right V1-3 CN VII: upper and lower face symmetric CN VIII: hearing intact to finger rub CN IX, X: gag intact, uvula midline CN XI: sternocleidomastoid and trapezius muscles intact CN XII: tongue midline Bulk & Tone: increased tone on right LE (more on right ankle), no fasciculations. Motor: 0/5 right foot dorsiflexion, eversion/inversion, 2/5 plantarflexion. Otherwise 5/5 throughout with no pronator drift. Sensation: decreased pin, cold on the right UE and LE, intactvibration and joint position sense. Romberg test negative Deep Tendon Reflexes: brisk +3 on the right UE and LE, no clonus, +2 on left UE and LE Plantar responses: downgoing bilaterally Cerebellar: no incoordination on finger to nose, heel to shin. No  dysdiadochokinesia Gait: spastic hemiparetic gait with foot circumduction Tremor: none  IMPRESSION: This is a pleasant 41 year old right-handed woman with a history of large left ICH and focal seizures in August 2016 with residual right foot drop, with no focal motor seizures on Keppra 1500mg  BID until hospital admission last 11/27/15. She was having brief episodes of paraphasic errors around every month, then on 11/27/15 had a prolonged episode where she was making paraphasic errors, followed by right-sided focal seizure. She has been seizure-free since addition of Vimpat 100mg  BID to Keppra 1500mg  BID, she has not had even the brief spells of paraphasic errors. She is overall tolerating medications. We had an extensive discussion about post-stroke seizures. They had several questions about post-stroke rehab which were answered as well. They will be travelling out the country in April, we discussed indications for going to the ER, she was given a prescription for prn Ativan to take for seizure clusters. [place] driving laws were discussed with the patient, and she knows to stop driving after a seizure, until 6 months seizure-free. She is starting to get depressed again about the driving restriction, continue to monitor mood and follow-up with  Psych if needed. I encouraged them to continue follow-up with Dr. Leonie Man for the stroke and I would be happy to continue care for her seizures. She will follow-up in 3 months and knows to call for any changes.   Thank you for allowing me to participate in the care of this patient. Please do not hesitate to call for any questions or concerns.   Ellouise Newer, M.D.  CC: Dr. Brigitte Pulse

## 2016-12-15 ENCOUNTER — Ambulatory Visit: Payer: BLUE CROSS/BLUE SHIELD | Admitting: Adult Health

## 2016-12-21 IMAGING — CT CT HEAD W/O CM
2 series · 15 of 30 positions shown, 19 images · non-contrast
Comparison: None.

CLINICAL DATA: Severe headaches 2 weeks postpartum

EXAM:
CT HEAD WITHOUT CONTRAST
TECHNIQUE: Contiguous axial images were obtained from the base of the skull
through the vertex without intravenous contrast.

[Series 201: head w/o, idose (1) · axial · non-contrast · 0.49mm/px · z∈[+220,+350]mm · 13 of 32 slices shown, 17 images]
[im 3/32  brain]
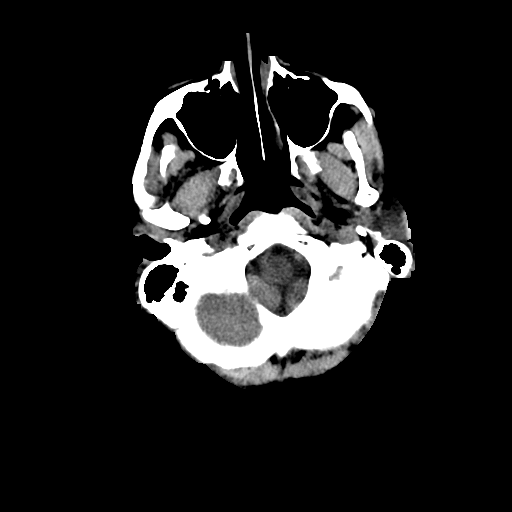
[im 3/32  bone]
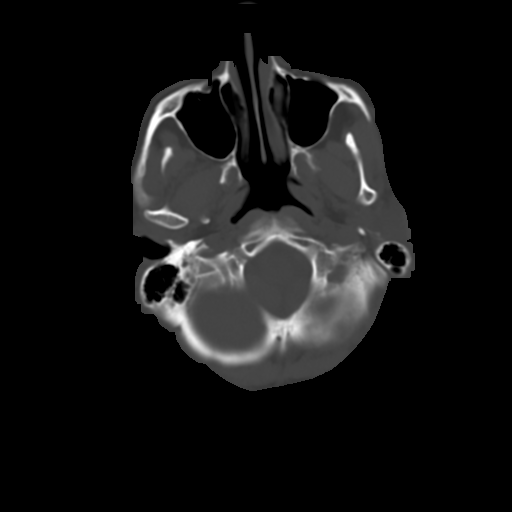
[im 5/32  brain]
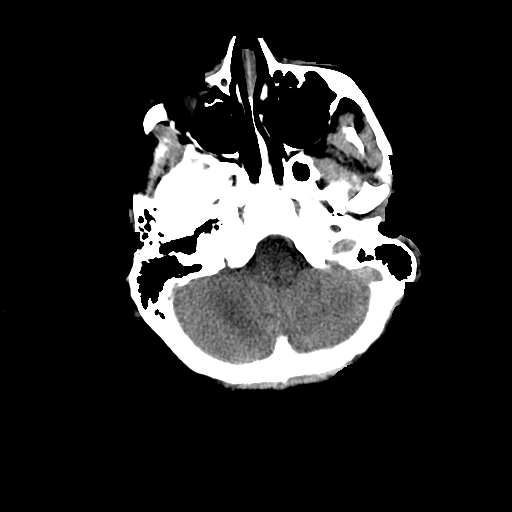
[im 7/32  brain]
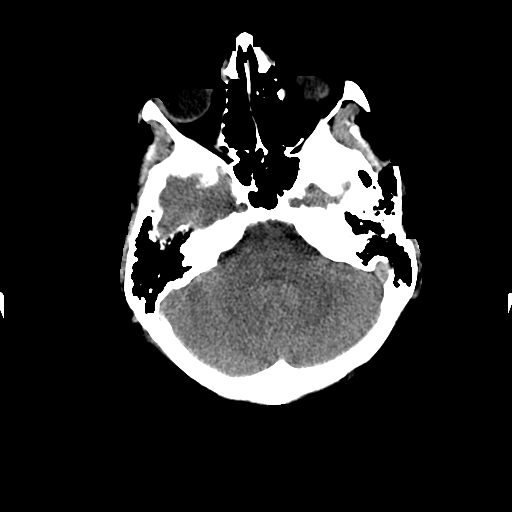
[im 9/32  brain]
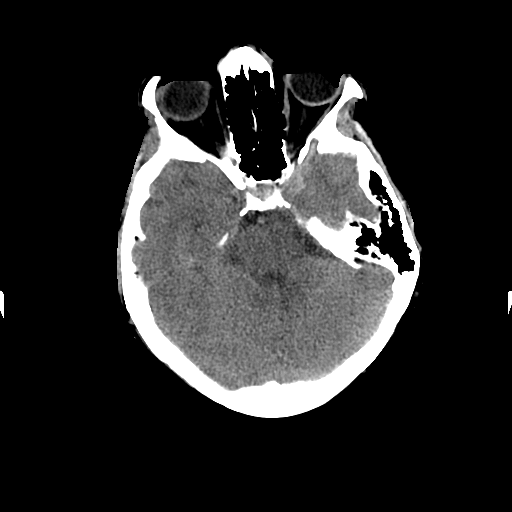
[im 12/32  brain]
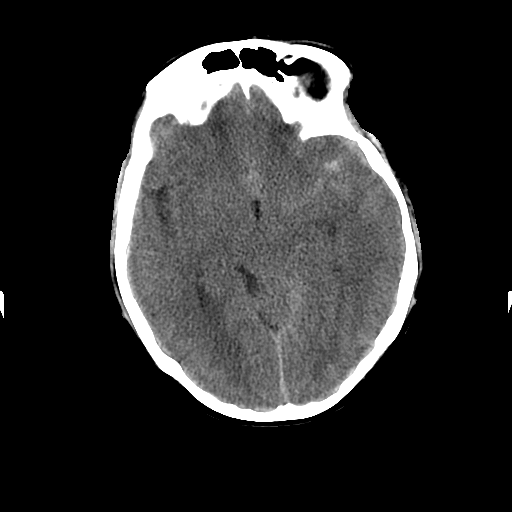
[im 12/32  bone]
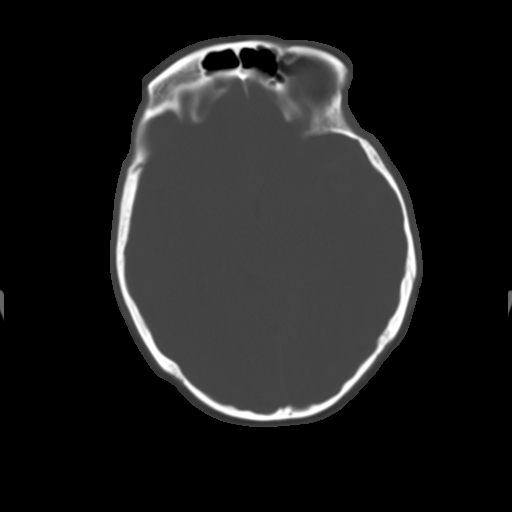
[im 14/32  brain]
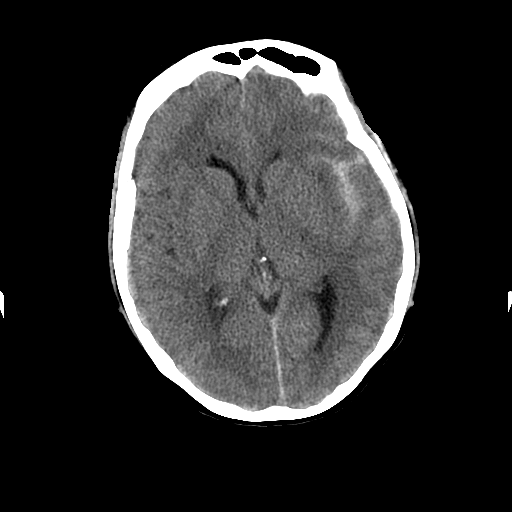
[im 16/32  brain]
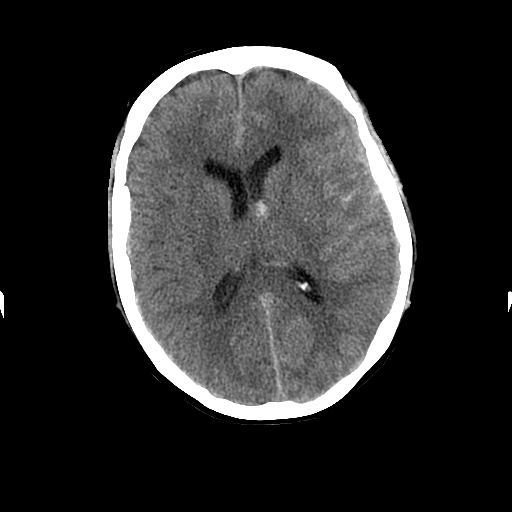
[im 18/32  brain]
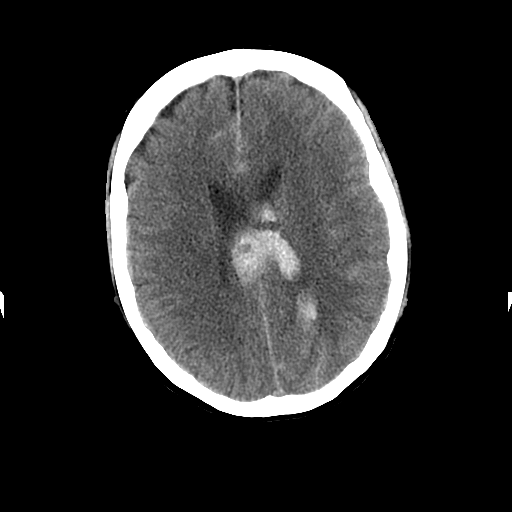
[im 20/32  brain]
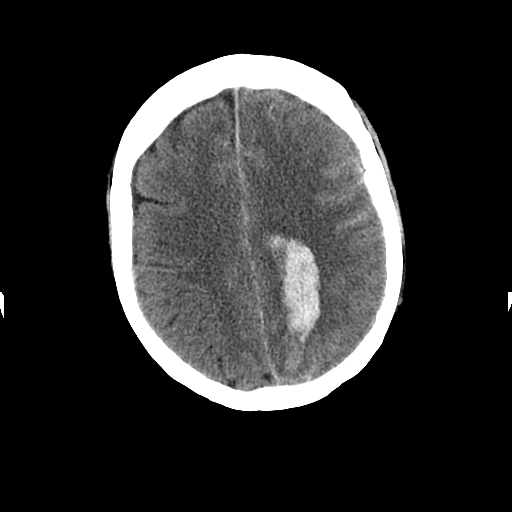
[im 20/32  bone]
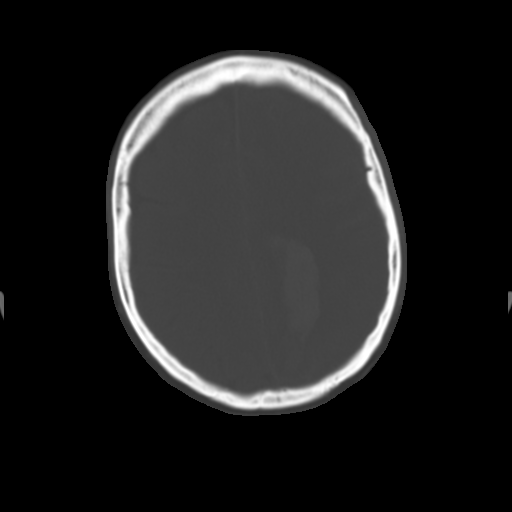
[im 23/32  brain]
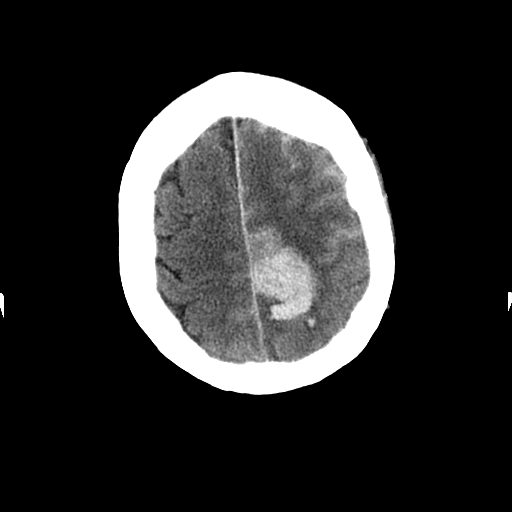
[im 25/32  brain]
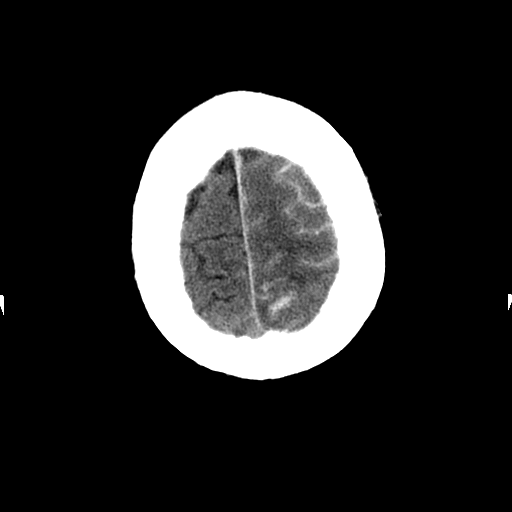
[im 27/32  brain]
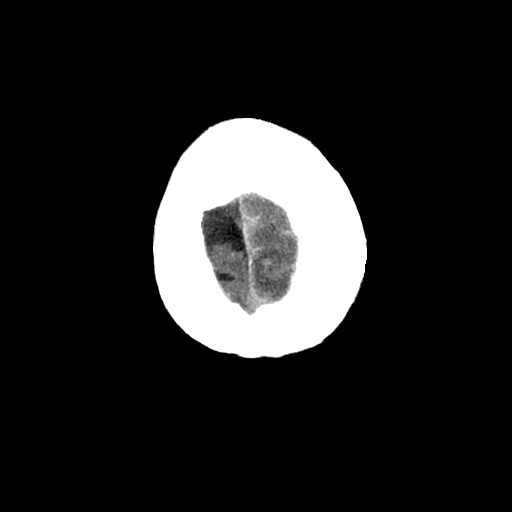
[im 29/32  brain]
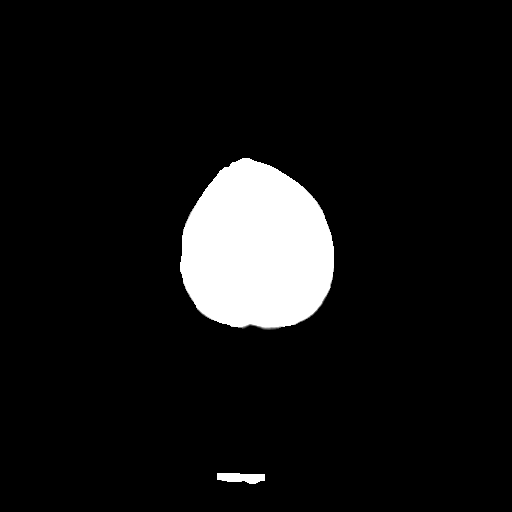
[im 29/32  bone]
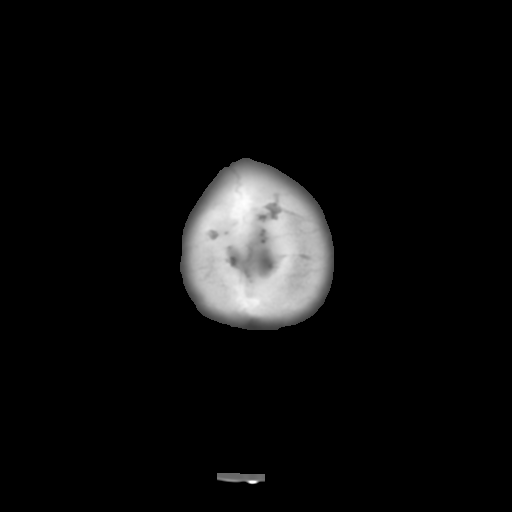

[Series 202: head w/o bone, idose (1) · axial · non-contrast · 0.49mm/px · z∈[+220,+240]mm · 2 of 32 slices shown]
[im 3/32  bone]
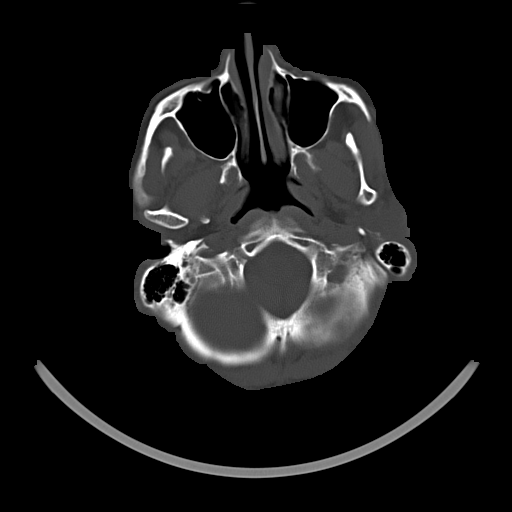
[im 7/32  bone]
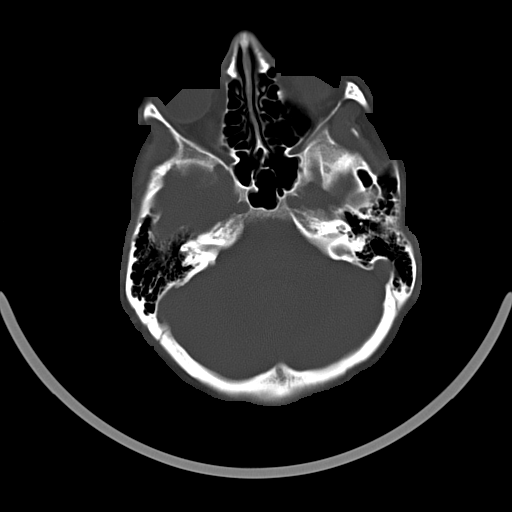

[15 of 30 positions shown; findings below may reference images not displayed]

FINDINGS: The ventricles are normal in size and configuration. There is a
hemorrhage which appears to arise from the left parietal lobe
measuring 6.2 x 3.1 cm. This hemorrhage tracks anteriorly and
inferiorly throughout the splenium of the corpus callosum involving
most of the splenium of the corpus callosum. Hemorrhage also tracks
just superior to the left lateral ventricle. There is subarachnoid
hemorrhage throughout much of the brain parenchyma in the
supratentorial region on the left with a small amount of
subarachnoid hemorrhage crossing to the right slightly anterior to
the rostrum of the corpus callosum.

There is no subdural or epidural fluid collection. There is no
midline shift. Elsewhere gray-white compartments appear normal. The
bony calvarium appears intact. The mastoid air cells are clear.
IMPRESSION: Extensive intraparenchymal hemorrhage which arises from the left
parietal lobe and extends into the splenium of the corpus callosum
and slightly superior to the left lateral ventricle. There is
extensive subarachnoid hemorrhage on the left with a small amount of
subarachnoid hemorrhage crossing to the right anteriorly. The
ventricles are not efface, and there is no midline shift. There is
no extra-axial fluid. Elsewhere gray-white compartments appear
normal.

Critical Value/emergent results were called by telephone at the time
of interpretation on 05/21/2015 at [DATE] to Dr. Ricci,
neurology, who verbally acknowledged these results.

## 2016-12-22 ENCOUNTER — Encounter: Payer: Self-pay | Admitting: Physical Medicine & Rehabilitation

## 2016-12-22 ENCOUNTER — Ambulatory Visit (HOSPITAL_BASED_OUTPATIENT_CLINIC_OR_DEPARTMENT_OTHER): Payer: BLUE CROSS/BLUE SHIELD | Admitting: Physical Medicine & Rehabilitation

## 2016-12-22 ENCOUNTER — Encounter: Payer: BLUE CROSS/BLUE SHIELD | Attending: Physical Medicine & Rehabilitation

## 2016-12-22 VITALS — BP 93/65 | HR 80

## 2016-12-22 DIAGNOSIS — R2689 Other abnormalities of gait and mobility: Secondary | ICD-10-CM | POA: Insufficient documentation

## 2016-12-22 DIAGNOSIS — Z808 Family history of malignant neoplasm of other organs or systems: Secondary | ICD-10-CM | POA: Diagnosis not present

## 2016-12-22 DIAGNOSIS — G8111 Spastic hemiplegia affecting right dominant side: Secondary | ICD-10-CM | POA: Insufficient documentation

## 2016-12-22 DIAGNOSIS — G8110 Spastic hemiplegia affecting unspecified side: Secondary | ICD-10-CM

## 2016-12-22 DIAGNOSIS — Z8673 Personal history of transient ischemic attack (TIA), and cerebral infarction without residual deficits: Secondary | ICD-10-CM | POA: Diagnosis not present

## 2016-12-22 IMAGING — CR DG CHEST 1V PORT
1 series · 1 of 1 positions shown · non-contrast
Comparison: None.

CLINICAL DATA: Left central line placement.  Initial encounter.

EXAM:
PORTABLE CHEST - 1 VIEW

[AP]
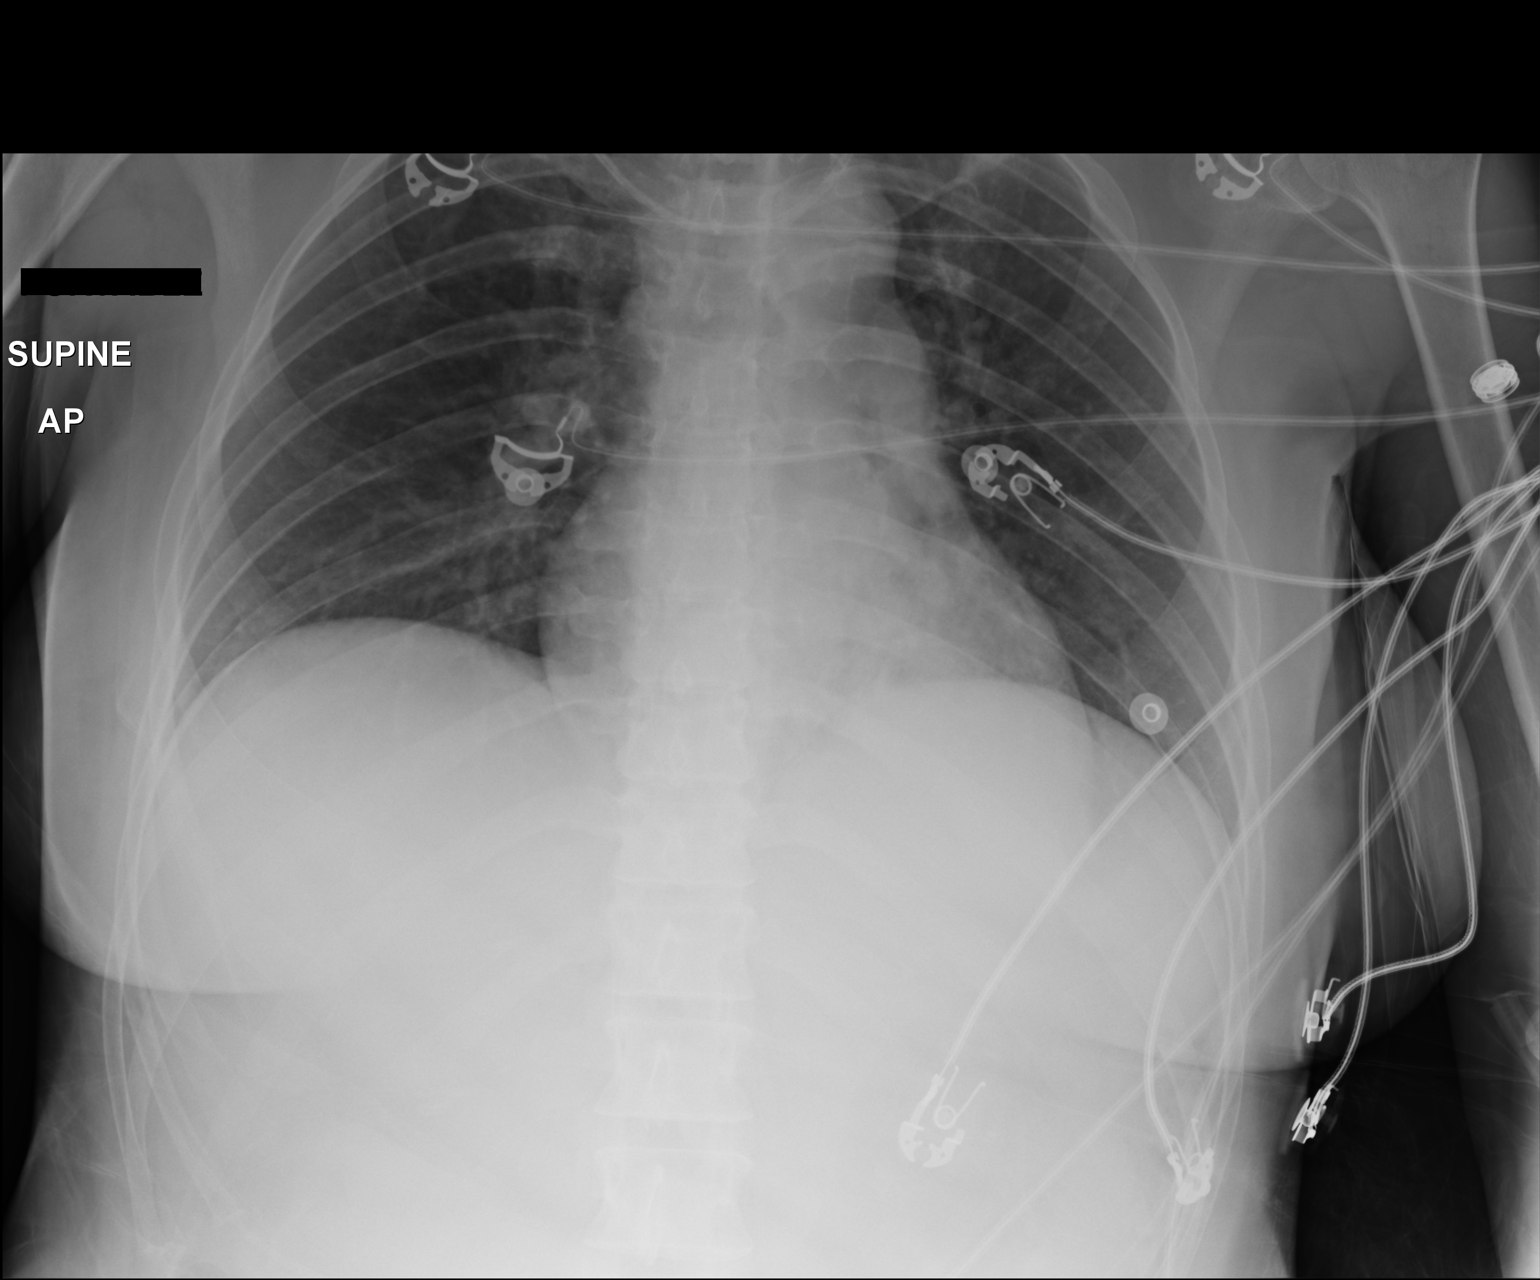

[1 of 1 positions shown; findings below may reference images not displayed]

FINDINGS: The patient's left IJ line is noted extending overlying the right
brachiocephalic vein. This should be retracted at least 5 cm and
repositioned.

The lungs are hypoexpanded. No pleural effusion or pneumothorax is
seen.

The cardiomediastinal silhouette is borderline normal in size. No
acute osseous abnormalities are identified.
IMPRESSION: 1. Left IJ line noted ending overlying the right brachiocephalic
vein. This should be retracted at least 5 cm and repositioned, as
deemed clinically appropriate.
2. Lungs hypoexpanded but grossly clear.

These results were called by telephone at the time of interpretation
acknowledged these results.

## 2016-12-22 NOTE — Patient Instructions (Signed)
If you've under go surgery. Please make an appointment so I could you after you have recovered.  If you consider doing the Dysport injection. Please call to make an appointment

## 2016-12-22 NOTE — Progress Notes (Signed)
Subjective:    Patient ID: Ashley [name], female    DOB: 1976-06-26, 41 y.o.   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  Chief complaint is gait problem after stroke. She has right foot drop and toe curling, feels like her foot is turning over on its side more as well. Last Botox injection performed on 03/17/16 Gastrosoleus 50 units medial and 50 units lateral Soleus 25 units 3 Posterior tibialis 50 units 3 Flexor digitorum longus 25 units 3  Last visit with me was approximately 6 months ago. Interval history underwent orthopedic evaluation for possible splatt procedure Pain Inventory Average Pain 0 Pain Right Now 0 My pain is no pain  In the last 24 hours, has pain interfered with the following? General activity 0 Relation with others 0 Enjoyment of life 0 What TIME of day is your pain at its worst? no pain Sleep (in general) Fair  Pain is worse with: no pain Pain improves with: no pain Relief from Meds: no pain  Mobility walk without assistance ability to climb steps?  yes do you drive?  no Do you have any goals in this area?  yes  Function not employed: date last employed . Do you have any goals in this area?  yes  Neuro/Psych numbness trouble walking depression  Prior Studies Any changes since last visit?  no  Physicians involved in your care Any changes since last visit?  no   Family History  Problem Relation Age of Onset  . Cancer Mother   . Cancer Father     pancreatic   Social History   Social History  . Marital status: Married    Spouse name: N/A  . Number of children: N/A  . Years of education: N/A   Social History Main Topics  . Smoking status: Never Smoker  . Smokeless tobacco: Never Used  . Alcohol use 0.6 oz/week    1 Glasses of wine per week     Comment: casual   . Drug use: No  . Sexual activity: Not Asked    Other Topics Concern  . None   Social History Narrative  . None   Past Surgical History:  Procedure Laterality Date  . DILATION AND CURETTAGE OF UTERUS     Past Medical History:  Diagnosis Date  . Anxiety   . ICH (intracerebral hemorrhage) ([place])   . Seizures ([place])   . Stroke ([place])    BP 93/65   Pulse 80   SpO2 96%   Opioid Risk Score:   Fall Risk Score:  `1  Depression screen PHQ 2/9  Depression screen PHQ 2/9 07/16/2015  Decreased Interest 1  Down, Depressed, Hopeless 1  PHQ - 2 Score 2  Altered sleeping 0  Tired, decreased energy 1  Change in appetite 1  Feeling bad or failure about yourself  1  Trouble concentrating 1  Moving slowly or fidgety/restless 1  Suicidal thoughts 1  PHQ-9 Score 8  Difficult doing work/chores Somewhat difficult  Some recent data might be hidden    Review of Systems  Constitutional: Negative.   HENT: Negative.   Respiratory: Negative.   Cardiovascular: Negative.   Gastrointestinal: Negative.   Endocrine: Negative.   Genitourinary: Negative.   Musculoskeletal: Positive for gait problem.  Skin: Negative.   Allergic/Immunologic: Negative.   Neurological: Positive for seizures and numbness.  Psychiatric/Behavioral: Positive for dysphoric mood.  All other  systems reviewed and are negative.      Objective:   Physical Exam  Constitutional: She is oriented to person, place, and time. She appears well-developed and well-nourished.  HENT:  Head: Normocephalic and atraumatic.  Eyes: Conjunctivae are normal. Pupils are equal, round, and reactive to light.  Neurological: She is alert and oriented to person, place, and time.  Psychiatric: She has a normal mood and affect. Her behavior is normal. Judgment and thought content normal.   5/5 strength in the right hip flexor and knee extensor trace ankle dorsiflexor, trace toe extensor 0 toe flexion 0 foot eversion, trace foot inversion. Good sensation in the right foot to light  touch. Ambulates with a steppage gait. She does hyperextend at the knee. She has plantarflexion at the ankle as well as inversion of the foot and curling of the toes.       Assessment & Plan:  1. Right spastic hemiplegia, right spastic equina varus causing date gait disturbance. We discussed treatment options including a trial of high-dose Dysport versus surgery. She would like to try Dysport injection   Trial of Dysport 1500 units Would recommend Gastroc 300 units Soleus 400 units Posterior tibialis 300 units Flexor digitorum longus, 200 units Flexor, hallucis longus, 200 units Flexor pollicis brevis. 100 units

## 2017-01-05 ENCOUNTER — Ambulatory Visit: Payer: BLUE CROSS/BLUE SHIELD | Admitting: Neurology

## 2017-01-05 ENCOUNTER — Encounter: Payer: Self-pay | Admitting: Physical Medicine & Rehabilitation

## 2017-01-05 ENCOUNTER — Ambulatory Visit (HOSPITAL_BASED_OUTPATIENT_CLINIC_OR_DEPARTMENT_OTHER): Payer: BLUE CROSS/BLUE SHIELD | Admitting: Physical Medicine & Rehabilitation

## 2017-01-05 VITALS — BP 90/45 | HR 69

## 2017-01-05 DIAGNOSIS — G8110 Spastic hemiplegia affecting unspecified side: Secondary | ICD-10-CM | POA: Diagnosis not present

## 2017-01-05 DIAGNOSIS — G8111 Spastic hemiplegia affecting right dominant side: Secondary | ICD-10-CM | POA: Diagnosis not present

## 2017-01-05 NOTE — Patient Instructions (Signed)

## 2017-01-05 NOTE — Progress Notes (Signed)
  Dysport Injection for spasticity using needle EMG guidance  Dilution: 200 Units/ml Indication: Severe spasticity which interferes with ADL,mobility and/or  hygiene and is unresponsive to medication management and other conservative care Informed consent was obtained after describing risks and benefits of the procedure with the patient. This includes bleeding, bruising, infection, excessive weakness, or medication side effects. A REMS form is on file and signed. Needle:  needle electrode Number of units per muscle Gastroc 300 units Soleus 400 units Posterior tibialis 300 units Flexor digitorum longus, 200 units Flexor, hallucis longus, 200 units Flexor pollicis brevis. 100 units All injections were done after obtaining appropriate EMG activity and after negative drawback for blood. The patient tolerated the procedure well. Post procedure instructions were given. A followup appointment was made.

## 2017-01-19 ENCOUNTER — Telehealth: Payer: Self-pay | Admitting: *Deleted

## 2017-01-19 NOTE — Telephone Encounter (Signed)
I spoke with [name] and he thinks it is more the weakness in the leg because the "speech thing has been there off and on over the past year"  He said she was calling him and he would go there and check her out and decide about going to the ED.

## 2017-01-19 NOTE — Telephone Encounter (Signed)
[name], Ashley [name]'s husband stopped by the front desk and she is concerned about the dysport that she received 01/05/17. (first of all they read the side effects and the "botulism" scared her) apparently she is not able to do the things she was doing like walk on treadmill and is having more problems with words and speech.  [name] would like for you to call him [phone]

## 2017-01-22 ENCOUNTER — Other Ambulatory Visit: Payer: Self-pay | Admitting: Neurology

## 2017-01-29 ENCOUNTER — Telehealth: Payer: Self-pay | Admitting: Physical Medicine & Rehabilitation

## 2017-01-29 DIAGNOSIS — G8110 Spastic hemiplegia affecting unspecified side: Secondary | ICD-10-CM

## 2017-01-29 NOTE — Telephone Encounter (Signed)
When patient was in last for a Botox injection, Dr. Letta Pate told her if she needed more PT to let him know and he would do an order for her.  She feels that she needs it and would like to go ahead and get this set up.  Please call patient.

## 2017-01-29 NOTE — Telephone Encounter (Signed)
Forwarding message to dr Letta Pate

## 2017-02-03 ENCOUNTER — Encounter: Payer: Self-pay | Admitting: Neurology

## 2017-02-03 ENCOUNTER — Ambulatory Visit (INDEPENDENT_AMBULATORY_CARE_PROVIDER_SITE_OTHER): Payer: BLUE CROSS/BLUE SHIELD | Admitting: Neurology

## 2017-02-03 VITALS — BP 90/58 | HR 70 | Ht 63.0 in | Wt 118.0 lb

## 2017-02-03 DIAGNOSIS — I69359 Hemiplegia and hemiparesis following cerebral infarction affecting unspecified side: Secondary | ICD-10-CM | POA: Diagnosis not present

## 2017-02-03 DIAGNOSIS — G40109 Localization-related (focal) (partial) symptomatic epilepsy and epileptic syndromes with simple partial seizures, not intractable, without status epilepticus: Secondary | ICD-10-CM

## 2017-02-03 DIAGNOSIS — M6281 Muscle weakness (generalized): Secondary | ICD-10-CM | POA: Insufficient documentation

## 2017-02-03 NOTE — Progress Notes (Signed)
NEUROLOGY FOLLOW UP OFFICE NOTE  Danaria Larsen [id number] 09-12-1976  HISTORY OF PRESENT ILLNESS: I had the pleasure of seeing Alexyia Guarino in follow-up in the neurology clinic on 02/03/2017. She is accompanied by her mother who helps supplement the history today. The patient was last seen 2 months ago for post-stroke seizures, after she had a breakthrough seizure on 11/26/2016 with aphasia followed by focal seizure on the right side. Vimpat 100mg  BID was added to Keppra 1500mg  BID. They present for an earlier visit due to new symptoms that started on 01/18/17. She just returned from a vacation at the [place] when she started noticing problems with her speech, describing difficulty talking and getting the words out, as well as hoarseness. She noticed swallowing difficulties, she tries to swallow and gets choked, feeling like the food is stuck and she has to drink a lot of water. When she chews, it feels like there is not enough room in her mouth. She has been really tired, with weakness in both upper arms, noticeable because she could not pick up her son above arm level. She has fallen several times, needing to hold on when she goes up stairs because she would feel tired. She recalls sweeping the floor and noticing her back was uncomfortable. She states it was not pain per se, but she could not stand up straight when she tried to do the elliptical. She has noticed changes in her vision, reporting blurred vision (no diplopia). Her mother confirms mild ptosis. She saw her PCP last Friday and had the myasthenia panel done, results unavailable for review. On further questioning, she denies any recent infection, no diarrheal illness (although her husband had GI issues while they were travelling). She had Dysport injection 42 units in the right LE with Dr. Letta Pate on 01/05/17. She has not noticed any improvement in her symptoms over the past 2 weeks, they have not been worsening either, no difficulty  breathing.  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 She had focal seizures in 2016 with EEG showing left PLEDs and has been on Keppra 1500mg  BID since then with no further focal motor seizures, however she would have occasional episodes around once a month where she could not get her words out, lasting 3-4 minutes. Sometimes she would get hot and have to sit but her speech would not be affected. On 11/27/16, she started having the same speech difficulties where she was making paraphasic errors, but it continued for 30 minutes and they decided to go to the ER. In the ER, she was noted to be aphasic, then as she was having a head CT, she suddenly had right gaze and head deviation, right arm and leg flexion and shaking for around 3 minutes. She was given IV Ativan but her right leg continued to shake in a rhythmic fashion. She was given additional Keppra and started on Vimpat with no further seizures, back to baseline in a couple of hours. She initially had some difficulties with the Vimpat 100mg  BID where she had hallucinations of hearing music or voices, but these have resolved over the past week. She denies any side effects on Keppra 1500mg  BID. She denies any dizziness, diplopia, gait instability. She has a right foot drop, denies any numbness/tingling on the right side but states it feels different that the other side. She denies any olfactory/gustatory  hallucinations, myoclonic jerks, episodes of staring/unresponsiveness, gaps in time. She has not had any further episodes of paraphasic errors since her hospitalization. She had a mild diffuse headache for a couple of days but this has resolved. She denies any dizziness, dysarthria/dysphagia, neck/back pain, bowel/bladder dysfunction.  Records from her hospitalization in August 2016 were reviewed. She has been seeing neurologist  Dr. Leonie Man for post-stroke and seizure care since then, last visit was in August 2017. She had a severe headache for several days in August 2016 2 weeks post-partum. She then developed right-sided weakness, aphasia, then a focal seizure in the hospital. She was found to have a large left parietal ICH with IVH, cerebral edema, obstructive hydrocephalus and subarachnoid hemorrhage. MRA and MRV were unremarkable. Concern was for cerebral cortical vein thrombosis not seen on MRV, however RCVS was also a consideration. She was treated with hypertonic saline and stabilized. She had a seizure with EEG showing left PLEDs. She was started on Keppra. Hypercoagulable labs were normal. She was discharged to rehab and had another seizure on 06/01/16 with report of eyes rolling back and upper extremities shaking. Keppra dose was increased. She had a repeat MRI brain in September 2016 with resolving subacute left parietal hematoma. There was acute gyral swelling and T2 hyperintensity in the left hippocampal formation, left insula, and operculum, that resolved with repeat MRI 09/2015 showing resolution of these changes. She was initially having some headaches and low dose Topamax was briefly added, then with resolution of headaches, this was tapered off. She had been dealing with post-stroke depression and symptoms had improved, however with limitations on driving again, she is starting to feel depressed again. She has three young children, the youngest is 64 months old. She was not happy with neuro rehab and has a private physical therapist coming weekly where she pushes herself and has made a ton of improvement. She does not like to wear her AFO and her husband did not notice any difference in gait with the AFO.   Epilepsy Risk Factors:  History of large left parietal hematoma with encephalomalacia. Otherwise she had a normal birth and early development.  There is no history of febrile convulsions, CNS infections such as  meningitis/encephalitis, significant traumatic brain injury, neurosurgical procedures, or family history of seizures.  Diagnostic Data: I personally reviewed MRI brain 11/26/2016 which did not show any acute changes. There was left paramedian parietal encephalomalacia with extensive hemosiderin staining extending into the splenium of the corpus callosum, ex vacuo dilatation of the left lateral ventricle occipital horn, increased side of the temporal horn of the left lateral ventricle which may be due to decreased volume of the hippocampus. It is smaller in size with increased signal compared to the right. EEG 11/27/16 showed mild left hemispheric slowing EEG 05/22/15: left hemisphere slowing, rare epileptiform discharges over the left anterior temporal region EEG 05/21/15: PLEDs on the left hemisphere, mainly involving the temporo-parietal region  Prior AEDs: Topamax PAST MEDICAL HISTORY: Past Medical History:  Diagnosis Date  . Anxiety   . ICH (intracerebral hemorrhage) ([place])   . Seizures ([place])   . Stroke [facility])     MEDICATIONS: Current Outpatient Prescriptions on File Prior to Visit  Medication Sig Dispense Refill  . FLUoxetine (PROZAC) 40 MG capsule Take 40 mg by mouth daily.    . Lacosamide 100 MG TABS Take 1 tablet (100 mg total) by mouth 2 (two) times daily. 60 tablet 5  . levETIRAcetam (KEPPRA) 750 MG tablet Take 2 tablets (1,500 mg  total) by mouth 2 (two) times daily. 120 tablet 5  . LORazepam (ATIVAN) 1 MG tablet Take 1 tablet as needed for seizure 10 tablet 3  . Multiple Vitamin (MULTIVITAMIN) tablet Take 1 tablet by mouth daily.      Current Facility-Administered Medications on File Prior to Visit  Medication Dose Route Frequency Provider Last Rate Last Dose  . gadopentetate dimeglumine (MAGNEVIST) injection 12 mL  12 mL Intravenous Once PRN Garvin Fila, MD        ALLERGIES: No Known Allergies  FAMILY HISTORY: Family History  Problem Relation Age of Onset  . Cancer  Mother   . Cancer Father     pancreatic    SOCIAL HISTORY: Social History   Social History  . Marital status: Married    Spouse name: N/A  . Number of children: N/A  . Years of education: N/A   Occupational History  . Not on file.   Social History Main Topics  . Smoking status: Never Smoker  . Smokeless tobacco: Never Used  . Alcohol use 0.6 oz/week    1 Glasses of wine per week     Comment: casual   . Drug use: No  . Sexual activity: Not on file   Other Topics Concern  . Not on file   Social History Narrative  . No narrative on file    REVIEW OF SYSTEMS: Constitutional: No fevers, chills, or sweats, no generalized fatigue, change in appetite Eyes: No visual changes, double vision, eye pain Ear, nose and throat: No hearing loss, ear pain, nasal congestion, sore throat Cardiovascular: No chest pain, palpitations Respiratory:  No shortness of breath at rest or with exertion, wheezes GastrointestinaI: No nausea, vomiting, diarrhea, abdominal pain, fecal incontinence Genitourinary:  No dysuria, urinary retention or frequency Musculoskeletal:  No neck pain, back pain Integumentary: No rash, pruritus, skin lesions Neurological: as above Psychiatric: No depression, insomnia, anxiety Endocrine: No palpitations, fatigue, diaphoresis, mood swings, change in appetite, change in weight, increased thirst Hematologic/Lymphatic:  No anemia, purpura, petechiae. Allergic/Immunologic: no itchy/runny eyes, nasal congestion, recent allergic reactions, rashes  PHYSICAL EXAM: Vitals:   02/03/17 1253  BP: (!) 90/58  Pulse: 70   General: No acute distress Head:  Normocephalic/atraumatic Neck: supple, no paraspinal tenderness, full range of motion Heart:  Regular rate and rhythm Lungs:  Clear to auscultation bilaterally Back: No paraspinal tenderness Skin/Extremities: No rash, no edema Neurological Exam: alert and oriented to person, place, and time. No aphasia or dysarthria.  Fund of knowledge is appropriate.  Recent and remote memory are intact.  Attention and concentration are normal.    Able to name objects and repeat phrases. are normal.    Able to name objects and repeat phrases. Cranial nerves: CN I: not tested CN II: pupils equal, round and reactive to light, visual fields intact, fundi unremarkable. CN III, IV, VI:  full range of motion, no nystagmus, mild bilateral ptosis at baseline, not fatigable with sustained upgaze CN V: decreased on right V1-3 (unchanged) CN VII: upper and lower face symmetric CN VIII: hearing intact to finger rub CN IX, X: gag intact, uvula midline CN XI: sternocleidomastoid and trapezius muscles intact CN XII: tongue midline Bulk & Tone: increased tone on right LE (more on right ankle), no fasciculations. Motor: 0/5 right foot dorsiflexion, eversion/inversion, 2/5 plantarflexion. Otherwise 5/5 throughout with no pronator drift.  Sensation: decreased on right UE and LE (unchanged) Deep Tendon Reflexes: brisk +3 on the right UE and LE, no clonus, +2 on  left UE and LE Plantar responses: downgoing bilaterally Cerebellar: no incoordination on finger to nose testing Gait: spastic hemiparetic gait with foot circumduction (unchanged) Tremor: none  IMPRESSION: This is a pleasant 41 yo RH woman with a history of large left ICH and focal seizures in August 2016 with residual right foot drop, with no focal motor seizures on Keppra 1500mg  BID until hospital admission last 11/27/15. She was having brief episodes of paraphasic errors around every month, then on 11/27/15 had a prolonged episode where she was making paraphasic errors, followed by right-sided focal seizure. She has been seizure-free since addition of Vimpat 100mg  BID to Keppra 1500mg  BID, she has not had even the brief spells of paraphasic errors.   She presents today for evaluation of new symptoms of swallowing/chewing/speech difficulties, blurred vision, hoarseness, bilateral  proximal arm weakness when lifting things (individual muscle testing today is at baseline, 5/5 on both UE), and mild urinary incontinence. Exam shows mild bilateral ptosis, pupils are reactive, reflexes intact. Her overall picture appears to be consistent with systemic toxicity from botulinum toxin that she received on 01/05/17. This is a rare side effect of Dysport, and typically thought to occur at around ~8000 to 12000 units of Dysport, per Dr. Letta Pate. Myasthenia can be a side effect as well, bloodwork for myasthenia panel was done last Friday by her PCP, results still unavailable. We discussed prognosis, how symptoms typically subside, and indications to go to the ER such as difficulty breathing. There are reports that Mestinon may help for severe symptoms, I will discuss this with Dr. Letta Pate if this is something he feels would be helpful post-Dysport. The patient did not feel Dysport helped with her foot overall. If symptoms persist more than 2 months out, we will plan for an EMG. She will follow-up as scheduled in 2 months and knows to call for any changes.   Thank you for allowing me to participate in her care.  Please do not hesitate to call for any questions or concerns.  The duration of this appointment visit was 25 minutes of face-to-face time with the patient.  Greater than 50% of this time was spent in counseling, explanation of diagnosis, planning of further management, and coordination of care.   Ellouise Newer, M.D.   CC: Dr. Brigitte Pulse, Dr. Letta Pate

## 2017-02-03 NOTE — Patient Instructions (Signed)
I will contact Dr. Letta Pate for an update and let you know if any changes in management. If symptoms worsen, go to ER immediately. Follow-up as scheduled in 2 months, call for any changes.

## 2017-02-05 ENCOUNTER — Telehealth: Payer: Self-pay | Admitting: *Deleted

## 2017-02-05 NOTE — Telephone Encounter (Signed)
-----   Message from [name], MD sent at 02/05/2017 12:28 PM EDT ----- Please see if pt can see me sooner, she had potential adverse reaction to Dysport and would like to re eval

## 2017-02-06 NOTE — Telephone Encounter (Signed)
LVM for patient to give our office a call to get her in soon than 05/7/ with Dr. Letta Pate.

## 2017-02-06 NOTE — Telephone Encounter (Signed)
Patient has been schedule  for Tuesday 02/10/2017 w/ Dr. Letta Pate.

## 2017-02-10 ENCOUNTER — Ambulatory Visit (HOSPITAL_BASED_OUTPATIENT_CLINIC_OR_DEPARTMENT_OTHER): Payer: BLUE CROSS/BLUE SHIELD | Admitting: Physical Medicine & Rehabilitation

## 2017-02-10 ENCOUNTER — Encounter: Payer: BLUE CROSS/BLUE SHIELD | Attending: Physical Medicine & Rehabilitation

## 2017-02-10 ENCOUNTER — Encounter: Payer: Self-pay | Admitting: Physical Medicine & Rehabilitation

## 2017-02-10 ENCOUNTER — Ambulatory Visit: Payer: BLUE CROSS/BLUE SHIELD | Admitting: Neurology

## 2017-02-10 VITALS — BP 102/61 | HR 93 | Resp 14

## 2017-02-10 DIAGNOSIS — G8111 Spastic hemiplegia affecting right dominant side: Secondary | ICD-10-CM | POA: Diagnosis present

## 2017-02-10 DIAGNOSIS — G8110 Spastic hemiplegia affecting unspecified side: Secondary | ICD-10-CM

## 2017-02-10 DIAGNOSIS — R531 Weakness: Secondary | ICD-10-CM | POA: Diagnosis not present

## 2017-02-10 DIAGNOSIS — Z8673 Personal history of transient ischemic attack (TIA), and cerebral infarction without residual deficits: Secondary | ICD-10-CM | POA: Insufficient documentation

## 2017-02-10 DIAGNOSIS — M21379 Foot drop, unspecified foot: Secondary | ICD-10-CM

## 2017-02-10 DIAGNOSIS — Z808 Family history of malignant neoplasm of other organs or systems: Secondary | ICD-10-CM | POA: Insufficient documentation

## 2017-02-10 DIAGNOSIS — R2689 Other abnormalities of gait and mobility: Secondary | ICD-10-CM

## 2017-02-10 NOTE — Progress Notes (Signed)
Subjective:    Patient ID: Ashley [name], female    DOB: Jan 20, 1976, 41 y.o.   MRN: [id number]  HPI 41 year old female with history of a left postpartum intracranial hemorrhage causing right hemiparesis and aphasia, onset in August 2016. She's had chronic problems with right foot drop and toe curling, she has undergone botulinum toxin injections Using a total of 175 units in the gastrocsoleus 150 units in the posterior tibialis And 75 units in the flexor digitorum longus performed 03/17/2016. Prior Botox injections, 09/13/2015, 12/13/2015 at lower doses  All injections were tolerated well. However, she did not feel she had a very good response.  She has been evaluated for surgery at [facility]. Possible split tibial tendon and Achilles lengthening procedure Initially discussed Dysport 06/23/2016 but had interval seizure disorder. Patient after stabilization of seizures, elected to undergo Dysport injection which was performed on 01/05/2017 Gastroc 300 units Soleus 400 units Posterior tibialis 300 units Flexor digitorum longus, 200 units Flexor, hallucis longus, 200 units Flexor pollicis brevis. 100 units  Patient states that approximately 1 week after injection returning after a trip to the [place] She developed increasing weakness in both upper extremities. At one point felt like she had some swallowing issues, but this cleared. Did not have any ocular symptoms or eyelid weakness. She did have some weakness in her thighs as well. She specifically noted that she could not pick up her son like she had before. Her son weighs 24 pounds Pain Inventory Average Pain 0 Pain Right Now 0 My pain is no pain  In the last 24 hours, has pain interfered with the following? General activity 0 Relation with others 0 Enjoyment of life 0 What TIME of day is your pain at its worst? no pain Sleep (in general) Fair  Pain is worse with: no pain Pain improves  with: no pain Relief from Meds: no pain  Mobility walk without assistance how many minutes can you walk? 60+ ability to climb steps?  yes do you drive?  no Do you have any goals in this area?  yes  Function not employed: date last employed . Do you have any goals in this area?  yes  Neuro/Psych bladder control problems weakness dizziness depression  Prior Studies Any changes since last visit?  no  Physicians involved in your care Any changes since last visit?  no   Family History  Problem Relation Age of Onset  . Cancer Mother   . Cancer Father     pancreatic   Social History   Social History  . Marital status: Married    Spouse name: N/A  . Number of children: N/A  . Years of education: N/A   Social History Main Topics  . Smoking status: Never Smoker  . Smokeless tobacco: Never Used  . Alcohol use 0.6 oz/week    1 Glasses of wine per week     Comment: casual   . Drug use: No  . Sexual activity: Not Asked   Other Topics Concern  . None   Social History Narrative  . None   Past Surgical History:  Procedure Laterality Date  . DILATION AND CURETTAGE OF UTERUS     Past Medical History:  Diagnosis Date  . Anxiety   . ICH (intracerebral hemorrhage) ([place])   . Seizures ([place])   . Stroke ([facility])    BP 102/61   Pulse 93   Resp 14   SpO2 98%   Opioid Risk Score:  Fall Risk Score:  `1  Depression screen PHQ 2/9  Depression screen PHQ 2/9 07/16/2015  Decreased Interest 1  Down, Depressed, Hopeless 1  PHQ - 2 Score 2  Altered sleeping 0  Tired, decreased energy 1  Change in appetite 1  Feeling bad or failure about yourself  1  Trouble concentrating 1  Moving slowly or fidgety/restless 1  Suicidal thoughts 1  PHQ-9 Score 8  Difficult doing work/chores Somewhat difficult  Some recent data might be hidden    Review of Systems  HENT: Negative.   Eyes: Negative.   Respiratory: Negative.   Cardiovascular: Negative.   Gastrointestinal:  Negative.   Endocrine: Negative.   Genitourinary: Positive for urgency.       Incontinence  Musculoskeletal: Negative.   Skin: Negative.   Allergic/Immunologic: Negative.   Neurological: Positive for dizziness and weakness.  Psychiatric/Behavioral: Positive for dysphoric mood.  All other systems reviewed and are negative.      Objective:   Physical Exam  No evidence of ptosis. Extraocular muscles intact. There is no evidence of tremor. Motor strength is 4/5, bilateral deltoid, bicep, tricep, 5/5 grip 4/5 right hip flexor 5/5 left hip flexor 4/5 right knee extensor, 5/5. Left knee extensor Ankle dorsiflexor is 3 minus on the right with inversion of the foot. 5/5 on the left. She demonstrates lifting her son. She has inability to lift him up overhead, but is able to use her biceps and lift him to her shoulder area  Tone Ashworth grade 2 at the ankle plantar flexors and toe flexors. With standing she does still go into toe flexion. However, her foot inversion is improved      Assessment & Plan:  1. Right spastic hemiplegia mainly affecting the right lower extremity. She had improvement in her foot inversion after Botox still has toe flexion spasticity. She has a more generalized muscle weakness, has been evaluated by neurology and suspected some systemic effect of botulinum toxin injection. We discussed that she does not have botulism given that she does not have infection by Clostridium botulinum bacteria. We discussed this could be a side effect of the Dysport and as such should clear over the course of 3 months. We can make referral to PT and OT. She is approximately one-month post. Given that the beneficial effects did not outweigh the side effects of medication, would not recommend repeat Dysport. Given that she did not get a good effect from the Botox would not retrial. She will decide whether she wants to follow-up with surgery  Answered questions from patient, discussed with  patient and her mother. I'll see her back in approximately 1 month's time

## 2017-02-10 NOTE — Patient Instructions (Addendum)
Weakness should improve by the end of June or Early July

## 2017-02-16 ENCOUNTER — Ambulatory Visit: Payer: BLUE CROSS/BLUE SHIELD | Admitting: Physical Medicine & Rehabilitation

## 2017-02-24 ENCOUNTER — Ambulatory Visit: Payer: BLUE CROSS/BLUE SHIELD | Attending: Physical Medicine & Rehabilitation

## 2017-02-24 ENCOUNTER — Encounter: Payer: Self-pay | Admitting: Occupational Therapy

## 2017-02-24 ENCOUNTER — Ambulatory Visit: Payer: BLUE CROSS/BLUE SHIELD | Admitting: Occupational Therapy

## 2017-02-24 DIAGNOSIS — I69851 Hemiplegia and hemiparesis following other cerebrovascular disease affecting right dominant side: Secondary | ICD-10-CM

## 2017-02-24 DIAGNOSIS — R278 Other lack of coordination: Secondary | ICD-10-CM | POA: Insufficient documentation

## 2017-02-24 DIAGNOSIS — M25511 Pain in right shoulder: Secondary | ICD-10-CM

## 2017-02-24 DIAGNOSIS — R2681 Unsteadiness on feet: Secondary | ICD-10-CM | POA: Insufficient documentation

## 2017-02-24 DIAGNOSIS — R2689 Other abnormalities of gait and mobility: Secondary | ICD-10-CM

## 2017-02-24 DIAGNOSIS — R293 Abnormal posture: Secondary | ICD-10-CM | POA: Insufficient documentation

## 2017-02-24 DIAGNOSIS — M25611 Stiffness of right shoulder, not elsewhere classified: Secondary | ICD-10-CM | POA: Diagnosis present

## 2017-02-24 DIAGNOSIS — I69351 Hemiplegia and hemiparesis following cerebral infarction affecting right dominant side: Secondary | ICD-10-CM | POA: Diagnosis present

## 2017-02-24 DIAGNOSIS — G8929 Other chronic pain: Secondary | ICD-10-CM | POA: Insufficient documentation

## 2017-02-24 DIAGNOSIS — R208 Other disturbances of skin sensation: Secondary | ICD-10-CM | POA: Diagnosis present

## 2017-02-24 NOTE — Therapy (Signed)
[facility] [address] [place], [place], [address] Phone: [phone]   Fax:  [phone]  Occupational Therapy Treatment  Patient Details  Name: Ashley [name] MRN: [id number] Date of Birth: [birth year] No Data Recorded  Encounter Date: 02/24/2017      OT End of Session - 02/24/17 1811    Visit Number [id number]   Number of Visits 39   Date for OT Re-Evaluation 04/21/17   Authorization Type BC[employer]   Authorization Time Period need to clarify insurance benefits regarding visit limitation - POC written for 16 visits   OT Start Time 0847   OT Stop Time 0929   OT Time Calculation (min) 42 min   Activity Tolerance Patient tolerated treatment well      Past Medical History:  Diagnosis Date  . Anxiety   . ICH (intracerebral hemorrhage) ([place])   . Seizures ([place])   . Stroke [facility])     Past Surgical History:  Procedure Laterality Date  . DILATION AND CURETTAGE OF UTERUS      There were no vitals filed for this visit.      Subjective Assessment - 02/24/17 0857    Subjective  I I had  a botox injection and my entire R side got weaker not just my leg.     Pertinent History see epic snapshot; pt with [facility] 2 weeks after deliverying her 3rd child   Patient Stated Goals to be able to use my R arm normally   Currently in Pain? No/denies            [facility] OT Assessment - 02/24/17 0001      Assessment   Diagnosis CVA   Onset Date 05/21/15   Prior Therapy CIR, outpatient PT, OT and ST     Precautions   Precautions Fall  7-8 after the botox injection   Precaution Comments 01/05/2017 botox injection that then led to weakness in entire R side. Pt seeing PT today     Restrictions   Weight Bearing Restrictions No     Balance Screen   Has the patient fallen in the past 6 months Yes   How many times? --  7-8 after botox injection to R leg   Has the patient had a decrease in activity level because of a fear of falling?  Yes      Home  Environment   Family/patient expects to be discharged to: Private residence   Living Arrangements Spouse/significant other  children     Prior Function   Level of Independence Independent   Vocation Part time employment   Optometrist     ADL   Tub/Shower Transfer Minimal assistance  if getting down and up from tub   ADL comments Pt was able to carry 42 year old child before and no longer able to - unable to get child into car seat.  Pt also reports that she has much more difficulty getting up from low surfaces and balance is worse when doing functional tasks.       IADL   Shopping Assistance for transportation;Needs to be accompanied on any shopping trip  can't pick up heavier items in store and was able to    [place] alone or with occasional assistance   Meal Prep Plans, prepares and serves adequate meals independently  assistance to lift casseroles, milk, etc    Community Mobility Relies on family or friends for transportation  pt had seizure 11/26/2016 and  can't drive for 6 months   Medication Management Is responsible for taking medication in correct dosages at correct time   Financial Management Manages financial matters independently (budgets, writes checks, pays rent, bills goes to bank), collects and keeps track of income     Mobility   Mobility Status History of falls  since botox injection     Written Expression   Dominant Hand Right     Vision - History   Additional Comments Pt reports blurry vision when she first had injection but reports this is improving     Cognition   Overall Cognitive Status History of cognitive impairments - at baseline   [place] Exam  Pt has memory problems but compensates well     Sensation   Light Touch Impaired by gross assessment   Stereognosis Impaired by gross assessment   Proprioception Impaired by gross assessment     Coordination   Gross Motor  Movements are Fluid and Coordinated Yes   Fine Motor Movements are Fluid and Coordinated No   9 Hole Peg Test Right   Right 9 Hole Peg Test 21.22     Perception   Perception Impaired  but at baseline from after stroke     Tone   Assessment Location Right Upper Extremity     ROM / Strength   AROM / PROM / Strength AROM;Strength;PROM     AROM   Overall AROM  Deficits   Overall AROM Comments shoulder flexion to 125*, abduction 130*     PROM   Overall PROM  Deficits   Overall PROM Comments Shoulder flexion to 130*, abduction to 140* then pt has tightness and pain.       Strength   Overall Strength Deficits   Overall Strength Comments shoulder flexion 3/5, abduction 3/5, elbow flexion, extension WFL's     Hand Function   Right Hand Grip (lbs) 45   Left Hand Grip (lbs) 45     RUE Tone   RUE Tone Within Functional Limits                            OT Short Term Goals - 02/24/17 1802      OT SHORT TERM GOAL #1   Title Pt will be mod I with HEP - 03/24/2017   Status New     OT SHORT TERM GOAL #2   Title Pt will be able to go from sit to stand from low surfaces mod I.   Status New     OT SHORT TERM GOAL #3   Title Pt will demonstrate ability to pick up items off floor without LOB   Status New           OT Long Term Goals - 02/24/17 1805      OT LONG TERM GOAL #1   Title Pt will be mod I with upgraded HEP - 04/21/2017   Status New     OT LONG TERM GOAL #2   Title Pt will be able to pick up 42 year old child out of crib and carry at least 10 feet    Status New     OT LONG TERM GOAL #3   Title Pt will demonstrate improved shoulder flexion to 130* to improve overhead reach for functional tasks   Status New     OT LONG TERM GOAL #4   Title Pt will be able to pick up heavy items from  grocery store using both hands without LOB.   Status New               Plan - 02/24/17 1807    Clinical Impression Statement Pt is well known to this  clinic and has been on hold for OT. Pt returns today after experiencing significant weakness through entire R side and increased falls after botox injection to RLE.  Pt presents today with increased RUE weakness, pain in R shoulder with overhead reach, decreased AROM of RUE, decreased balance, decreased trunk control, decreased functional use of RUE.  Pt will benefit from resumption of skilled OT to address these acute changes   Rehab Potential Good   OT Frequency 2x / week   OT Duration 8 weeks   OT Treatment/Interventions Self-care/ADL training;Aquatic Therapy;Therapeutic exercise;Neuromuscular education;Passive range of motion;Manual Therapy;Therapist, nutritional;Therapeutic activities;Patient/family education;Balance training   Plan address pain/AROM for RUE for overhead reach, NMR for trunk and RUE functional use, strengthening of RUE, balance, functional mobility   Consulted and Agree with Plan of Care Patient      Patient will benefit from skilled therapeutic intervention in order to improve the following deficits and impairments:  Abnormal gait, Decreased balance, Decreased range of motion, Decreased strength, Impaired UE functional use, Impaired sensation, Pain, Decreased mobility  Visit Diagnosis: Abnormal posture - Plan: Ot plan of care cert/re-cert  Stiffness of right shoulder, not elsewhere classified - Plan: Ot plan of care cert/re-cert  Spastic hemiplegia of right dominant side as late effect of other cerebrovascular disease ([place]) - Plan: Ot plan of care cert/re-cert  Chronic right shoulder pain - Plan: Ot plan of care cert/re-cert  Unsteadiness on feet - Plan: Ot plan of care cert/re-cert    Problem List Patient Active Problem List   Diagnosis Date Noted  . Muscle weakness 02/03/2017  . History of hemorrhagic stroke with residual hemiparesis ([place]) 12/10/2016  . Spastic hemiplegia and hemiparesis affecting dominant side ([place]) 09/13/2015  . Adhesive capsulitis of  right shoulder 08/24/2015  . Localization-related symptomatic epilepsy and epileptic syndromes with simple partial seizures, not intractable, without status epilepticus ([place]) 08/21/2015  . Nontraumatic cortical hemorrhage of cerebral hemisphere ([place])   . Seizure ([place])   . Seizures ([place])   . Adjustment disorder with depressed mood   . Contracture of muscle ankle and foot 06/11/2015  . Hemiplga fol ntrm intcrbl hemor aff right dominant side ([place]) 06/06/2015  . Left-sided intracerebral hemorrhage ([place]) 06/04/2015  . Seizure disorder as sequela of cerebrovascular accident ([place])   . Seizure disorder ([place]) 06/03/2015  . Sepsis ([place]) 06/03/2015  . UTI (urinary tract infection) 06/03/2015  . Hypotension 06/02/2015  . Right spastic hemiparesis ([place]) 05/28/2015  . 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 History of anxiety disorder 05/28/2015  . ICH (intracerebral hemorrhage) ([place])   . Seizure disorder, nonconvulsive, with status epilepticus ([place])   . Cerebral venous thrombosis of cortical vein   . Cytotoxic cerebral edema ([place])   . IVH (intraventricular hemorrhage) ([place])   . Term pregnancy 05/07/2015  . Spontaneous vaginal delivery 05/07/2015    [facility] 02/24/2017, 6:17 PM  [facility] [address] [place], [place], [address] Phone: [phone]   Fax:  [phone]  Name: Alicianna Litchford MRN: [id number] Date of Birth: December 06, 1975

## 2017-02-24 NOTE — Therapy (Signed)
[facility] [address] [place], [place], [address] Phone: [phone]   Fax:  [phone]  Physical Therapy Treatment  Patient Details  Name: Ashley [name] MRN: [id number] Date of Birth: 10-07-1976 No Data Recorded  Encounter Date: 02/24/2017      PT End of Session - 02/24/17 1011    Visit Number 1   Number of Visits 17  PT requesting add'l 17 visits due to pt's incr. weakness after Dysport injections   Date for PT Re-Evaluation 04/25/17   Authorization Type BCBS: 32 PT visits   Authorization - Visit Number 3   Authorization - Number of Visits 30   PT Start Time [phone]   PT Stop Time 1011   PT Time Calculation (min) 40 min   Equipment Utilized During Treatment --  min guard to S prn   Activity Tolerance Patient tolerated treatment well   Behavior During Therapy WFL for tasks assessed/performed      Past Medical History:  Diagnosis Date  . Anxiety   . ICH (intracerebral hemorrhage) ([place])   . Seizures ([place])   . Stroke [facility])     Past Surgical History:  Procedure Laterality Date  . DILATION AND CURETTAGE OF UTERUS      There were no vitals filed for this visit.      Subjective Assessment - 02/24/17 0937    Subjective Pt familiar to this clinic and presents for re-eval s/p Dysport injection, with incr. R-sided weakness (core and RUE/LE weakness). Pt reported at least 7 falls since Dysport injections. Pt reports difficulty with swallowing and hoarse voice, along with dry mouth. Pt's family told pt that sometimes her words don't come out right. Pt has experience bladder control problems. Pt is not using R posterior AFO very often and has borrowed R toe off AFO from [employer] but has not used often. Pt feels she is hiking R hip more often.    Pertinent History Seizures, low BP   Patient Stated Goals "I want to swim, walk better, kick a soccer ball with children and run."   Currently in Pain? No/denies             [facility] PT Assessment - 02/24/17 0942      Sensation   Light Touch Impaired by gross assessment   Additional Comments Decr. light touch in R LE and incr. N/T in R LE.     Strength   Overall Strength Deficits   Overall Strength Comments LLE grossly: 4/5. R hip flex: 2+/5, knee ext: 3+/5, knee flex: 3+/5, ankle DF: 2/5. R hip abd/add in seated position: 3+/5.     Ambulation/Gait   Ambulation/Gait Yes   Ambulation/Gait Assistance 5: Supervision   Ambulation/Gait Assistance Details No AFO donned.   Ambulation Distance (Feet) 300 Feet   Assistive device None   Gait Pattern Decreased stance time - right;Decreased weight shift to right;Right foot flat;Decreased hip/knee flexion - right;Step-through pattern;Decreased step length - left;Trunk rotated posteriorly on right;Decreased dorsiflexion - right;Right hip hike   Ambulation Surface Level;Indoor   Gait velocity 3.71ft/sec no AD or AFO donned     Standardized Balance Assessment   Standardized Balance Assessment Timed Up and Go Test;Five Times Sit to Stand   Five times sit to stand comments  35.0 seconds from mat table no UE support. 17.5 sec. from chair with armrests     Timed Up and Go Test   TUG Normal TUG   Normal TUG (seconds) 11.3  no AD  TUG Comments Pt with incr. postural sway during turns.     Functional Gait  Assessment   Gait assessed  Yes   Gait Level Surface Walks 20 ft in less than 7 sec but greater than 5.5 sec, uses assistive device, slower speed, mild gait deviations, or deviates 6-10 in outside of the 12 in walkway width.  7.7 sec.   Change in Gait Speed Able to change speed, demonstrates mild gait deviations, deviates 6-10 in outside of the 12 in walkway width, or no gait deviations, unable to achieve a major change in velocity, or uses a change in velocity, or uses an assistive device.   Gait with Horizontal Head Turns Performs head turns smoothly with slight change in gait velocity (eg, minor disruption  to smooth gait path), deviates 6-10 in outside 12 in walkway width, or uses an assistive device.   Gait with Vertical Head Turns Performs task with slight change in gait velocity (eg, minor disruption to smooth gait path), deviates 6 - 10 in outside 12 in walkway width or uses assistive device   Gait and Pivot Turn Pivot turns safely within 3 sec and stops quickly with no loss of balance.   Step Over Obstacle Is able to step over 2 stacked shoe boxes taped together (9 in total height) without changing gait speed. No evidence of imbalance.   Gait with Narrow Base of Support Ambulates less than 4 steps heel to toe or cannot perform without assistance.   Gait with Eyes Closed Walks 20 ft, uses assistive device, slower speed, mild gait deviations, deviates 6-10 in outside 12 in walkway width. Ambulates 20 ft in less than 9 sec but greater than 7 sec.  8.7 sec.   Ambulating Backwards Walks 20 ft, no assistive devices, good speed, no evidence for imbalance, normal gait   Steps Alternating feet, must use rail.   Total Score 21   FGA comment: R AFO not donned.                             PT Education - 02/24/17 1010    Education provided Yes   Education Details PT discussed exam findings and outcome measure results. PT discussed new POC/duration/frequency based on findings.    Person(s) Educated Patient   Methods Explanation   Comprehension Verbalized understanding          PT Short Term Goals - 02/24/17 1658      PT SHORT TERM GOAL #1   Title Pt will be IND in progressed HEP to improve strength, balance and gait deviations. TARGET DATE FOR ALL STGS: 03/24/17   Status New     PT SHORT TERM GOAL #2   Title Pt will improve FGA score to >/=24/30 to decr. falls risk.   Status New     PT SHORT TERM GOAL #3   Title Pt will trial running on treadmill and write goal if appropriate.   Status New     PT SHORT TERM GOAL #4   Title Pt will descend 12 steps in step through  pattern, without holding rails at MOD I level (incr. time) to carry objects at home.    Status New     PT SHORT TERM GOAL #5   Title Pt will amb. 500' without AD over even terrain, with R AFO donned, at MOD I level to improve functional mobility.    Status New  PT Long Term Goals - 02/24/17 1700      PT LONG TERM GOAL #1   Title Pt will improve FGA score to >/=27/30 to decr. falls risk. TARGET DATE FOR ALL LTGS: 04/21/17   Status New     PT LONG TERM GOAL #2   Title Pt will be able to kick a soccer ball for 5 minutes in order to play with her children.   Status New     PT LONG TERM GOAL #3   Title Trial [place] [employer] AFO on treadmill and write goal if indicated.   Status New     PT LONG TERM GOAL #4   Title Pt will amb. 1000' over uneven terrain outdoors, IND, in order to improve functional mobility.   Status New               Plan - 02/24/17 1013    Clinical Impression Statement Pt presents for re-eval 2/2 increased weakness s/p RLE Dysport injections. Pt's balance, strength, coordination, and sensation all found to be more impaired than last appt. on 10/21/16. Pt's FGA score indicates she is at moderate risk for falls. Pt was able to perform TUG without and AD in <13.5 seconds but did experience incr. postural sway during turns. Pt was unable to complete 5x STS test in <12 seconds 2/2 impaired strength and balance. Pt would continue to benefit from skilled PT to improve safety during functional mobility. PT requesting 2x/week for 8 weeks 2/2 change in medical status and function.   Rehab Potential Good   Clinical Impairments Affecting Rehab Potential Seizures which may limit intensity of PT   PT Frequency 2x / week  requesting add't 2x/week for 8 weeks   PT Duration 8 weeks   PT Treatment/Interventions ADLs/Self Care Home Management;Neuromuscular re-education;Cognitive remediation;Biofeedback;DME Instruction;Gait training;Stair training;Canalith  Repostioning;Patient/family education;Orthotic Fit/Training;Balance training;Therapeutic exercise;Manual techniques;Therapeutic activities;Vestibular   PT Next Visit Plan Review and modify HEP, schedule appt. with Gerald Stabs to trial [address] [employer] AFO   PT Home Exercise Plan Stretching/strength/balance HEP   Consulted and Agree with Plan of Care Patient      Patient will benefit from skilled therapeutic intervention in order to improve the following deficits and impairments:  Abnormal gait, Decreased endurance, Impaired sensation, Decreased knowledge of precautions, Decreased activity tolerance, Decreased knowledge of use of DME, Decreased strength, Impaired UE functional use, Impaired tone, Decreased balance, Decreased mobility, Decreased cognition, Decreased range of motion, Decreased safety awareness, Decreased coordination, Impaired flexibility, Postural dysfunction  Visit Diagnosis: Other abnormalities of gait and mobility  Other disturbances of skin sensation  Hemiplegia and hemiparesis following cerebral infarction affecting right dominant side ([place])  Other lack of coordination     Problem List Patient Active Problem List   Diagnosis Date Noted  . Muscle weakness 02/03/2017  . History of hemorrhagic stroke with residual hemiparesis ([place]) 12/10/2016  . Spastic hemiplegia and hemiparesis affecting dominant side ([place]) 09/13/2015  . Adhesive capsulitis of right shoulder 08/24/2015  . Localization-related symptomatic epilepsy and epileptic syndromes with simple partial seizures, not intractable, without status epilepticus ([place]) 08/21/2015  . Nontraumatic cortical hemorrhage of cerebral hemisphere ([place])   . Seizure ([place])   . Seizures ([place])   . Adjustment disorder with depressed mood   . Contracture of muscle ankle and foot 06/11/2015  . Hemiplga fol ntrm intcrbl hemor aff right dominant side ([place]) 06/06/2015  . Left-sided intracerebral hemorrhage ([place]) 06/04/2015  . Seizure disorder  as sequela of cerebrovascular accident ([place])   . Seizure disorder ([place])  06/03/2015  . Sepsis ([place]) 06/03/2015  . UTI (urinary tract infection) 06/03/2015  . Hypotension 06/02/2015  . Right spastic hemiparesis ([place]) 05/28/2015  . 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 History of anxiety disorder 05/28/2015  . ICH (intracerebral hemorrhage) ([place])   . Seizure disorder, nonconvulsive, with status epilepticus ([place])   . Cerebral venous thrombosis of cortical vein   . Cytotoxic cerebral edema ([place])   . IVH (intraventricular hemorrhage) ([place])   . Term pregnancy 05/07/2015  . Spontaneous vaginal delivery 05/07/2015    Laurelle Skiver L 02/24/2017, 5:07 PM  [facility] [address] [place], [place], [address] Phone: [phone]   Fax:  [phone]  Name: Marguerite Barba MRN: [id number] Date of Birth: 1976-06-09  Geoffry Paradise, PT,DPT 02/24/17 5:07 PM Phone: [phone] Fax: [phone]

## 2017-03-02 ENCOUNTER — Ambulatory Visit: Payer: BLUE CROSS/BLUE SHIELD | Admitting: Occupational Therapy

## 2017-03-03 ENCOUNTER — Ambulatory Visit: Payer: BLUE CROSS/BLUE SHIELD | Admitting: Occupational Therapy

## 2017-03-03 ENCOUNTER — Encounter: Payer: Self-pay | Admitting: Occupational Therapy

## 2017-03-03 DIAGNOSIS — R2681 Unsteadiness on feet: Secondary | ICD-10-CM

## 2017-03-03 DIAGNOSIS — R293 Abnormal posture: Secondary | ICD-10-CM

## 2017-03-03 DIAGNOSIS — R2689 Other abnormalities of gait and mobility: Secondary | ICD-10-CM | POA: Diagnosis not present

## 2017-03-03 DIAGNOSIS — M25511 Pain in right shoulder: Secondary | ICD-10-CM

## 2017-03-03 DIAGNOSIS — M25611 Stiffness of right shoulder, not elsewhere classified: Secondary | ICD-10-CM

## 2017-03-03 DIAGNOSIS — G8929 Other chronic pain: Secondary | ICD-10-CM

## 2017-03-03 DIAGNOSIS — I69851 Hemiplegia and hemiparesis following other cerebrovascular disease affecting right dominant side: Secondary | ICD-10-CM

## 2017-03-03 NOTE — Therapy (Signed)
[facility] [address] [place], [place], [address] Phone: [phone]   Fax:  [phone]  Occupational Therapy Treatment  Patient Details  Name: Ashley [name] MRN: [id number] Date of Birth: 04-12-1976 No Data Recorded  Encounter Date: 03/03/2017      OT End of Session - 03/03/17 1243    Visit Number 57   Number of Visits 34   Date for OT Re-Evaluation 04/21/17   Authorization Type BCBS   Authorization Time Period per insurance check, pt with 32 OT visits and 57 PT visits   Authorization - Visit Number 2   Authorization - Number of Visits 30   OT Start Time 0846   OT Stop Time 0929   OT Time Calculation (min) 43 min   Activity Tolerance Patient tolerated treatment well      Past Medical History:  Diagnosis Date  . Anxiety   . ICH (intracerebral hemorrhage) ([place])   . Seizures ([place])   . Stroke [facility])     Past Surgical History:  Procedure Laterality Date  . DILATION AND CURETTAGE OF UTERUS      There were no vitals filed for this visit.      Subjective Assessment - 03/03/17 0853    Subjective  My arm is tighter and weaker   Pertinent History see epic snapshot; pt with SAH 2 weeks after deliverying her 3rd child   Patient Stated Goals to be able to use my R arm normally   Currently in Pain? No/denies                      OT Treatments/Exercises (OP) - 03/03/17 0001      Neurological Re-education Exercises   Other Exercises 1 Neuro re ed to addres core/trunk strength incorporating reaching activity with resistance, alternating sides.  Also addressed bilateral overhead reach with 5 pound weight in supine to address core stabilization, weighted stretch into shoulder flexoin and proximal strengthening. Pt tolerated well. Limited pt in HEP at this time in order to increase pain free ROM, improve alignment, improve trunk control/strength/control prior to strengthening RUE.  Pt in agreement with  progression. Current status change felt to be due to botox injection pt had several weeks ago.      Manual Therapy   Manual Therapy Joint mobilization;Soft tissue mobilization;Scapular mobilization   Manual therapy comments joint, soft tissue and scapular mob to address increased tightness in R shoulder girdle prior to neuro re ed; also addressed improved alignment as pt initially had pain at end of her range for shoulder flexion.                   OT Short Term Goals - 03/03/17 1241      OT SHORT TERM GOAL #1   Title Pt will be mod I with HEP - 03/24/2017   Status On-going     OT SHORT TERM GOAL #2   Title Pt will be able to go from sit to stand from low surfaces mod I.   Status On-going     OT SHORT TERM GOAL #3   Title Pt will demonstrate ability to pick up items off floor without LOB   Status On-going           OT Long Term Goals - 03/03/17 1242      OT LONG TERM GOAL #1   Title Pt will be mod I with upgraded HEP - 04/21/2017   Status On-going  OT LONG TERM GOAL #2   Title Pt will be able to pick up 23 year old child out of crib and carry at least 10 feet    Status On-going     OT LONG TERM GOAL #3   Title Pt will demonstrate improved shoulder flexion to 130* to improve overhead reach for functional tasks   Status On-going     OT LONG TERM GOAL #4   Title Pt will be able to pick up heavy items from grocery store using both hands without LOB.   Status On-going               Plan - 03/03/17 1242    Clinical Impression Statement Reviewed goals and POC and pt in agreement. Pt progressing toward goals.    Rehab Potential Good   Clinical Impairments Affecting Rehab Potential  impaired sensation and anxiety   OT Frequency 2x / week   OT Duration 8 weeks   OT Treatment/Interventions Self-care/ADL training;Aquatic Therapy;Therapeutic exercise;Neuromuscular education;Passive range of motion;Manual Therapy;Therapist, nutritional;Therapeutic  activities;Patient/family education;Balance training   Plan address pain, ROM for RUE for overhead reach, NMR for trunkand RUE functional use, strengthening of RUE, balanace, functional mobility   Consulted and Agree with Plan of Care Patient      Patient will benefit from skilled therapeutic intervention in order to improve the following deficits and impairments:  Abnormal gait, Decreased balance, Decreased range of motion, Decreased strength, Impaired UE functional use, Impaired sensation, Pain, Decreased mobility  Visit Diagnosis: Abnormal posture  Stiffness of right shoulder, not elsewhere classified  Spastic hemiplegia of right dominant side as late effect of other cerebrovascular disease (HCC)  Chronic right shoulder pain  Unsteadiness on feet    Problem List Patient Active Problem List   Diagnosis Date Noted  . Muscle weakness 02/03/2017  . History of hemorrhagic stroke with residual hemiparesis ([place]) 12/10/2016  . Spastic hemiplegia and hemiparesis affecting dominant side ([place]) 09/13/2015  . Adhesive capsulitis of right shoulder 08/24/2015  . Localization-related symptomatic epilepsy and epileptic syndromes with simple partial seizures, not intractable, without status epilepticus ([place]) 08/21/2015  . Nontraumatic cortical hemorrhage of cerebral hemisphere ([place])   . Seizure ([employer])   . Seizures ([place])   . Adjustment disorder with depressed mood   . Contracture of muscle ankle and foot 06/11/2015  . Hemiplga fol ntrm intcrbl hemor aff right dominant side ([place]) 06/06/2015  . Left-sided intracerebral hemorrhage ([place]) 06/04/2015  . Seizure disorder as sequela of cerebrovascular accident ([place])   . Seizure disorder ([place]) 06/03/2015  . Sepsis ([place]) 06/03/2015  . UTI (urinary tract infection) 06/03/2015  . Hypotension 06/02/2015  . Right spastic hemiparesis ([place]) 05/28/2015  . 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 History of anxiety disorder  05/28/2015  . ICH (intracerebral hemorrhage) ([place])   . Seizure disorder, nonconvulsive, with status epilepticus ([place])   . Cerebral venous thrombosis of cortical vein   . Cytotoxic cerebral edema ([place])   . IVH (intraventricular hemorrhage) ([place])   . Term pregnancy 05/07/2015  . Spontaneous vaginal delivery 05/07/2015    Quay Burow, OTR/L 03/03/2017, 12:47 PM  [facility] [address] [place], [place], [address] Phone: [phone]   Fax:  [phone]  Name: Ashley [name] MRN: [id number] Date of Birth: 1976/03/24

## 2017-03-05 ENCOUNTER — Encounter: Payer: Self-pay | Admitting: Rehabilitation

## 2017-03-05 ENCOUNTER — Ambulatory Visit: Payer: BLUE CROSS/BLUE SHIELD | Admitting: Rehabilitation

## 2017-03-05 DIAGNOSIS — R208 Other disturbances of skin sensation: Secondary | ICD-10-CM

## 2017-03-05 DIAGNOSIS — R2689 Other abnormalities of gait and mobility: Secondary | ICD-10-CM | POA: Diagnosis not present

## 2017-03-05 DIAGNOSIS — I69351 Hemiplegia and hemiparesis following cerebral infarction affecting right dominant side: Secondary | ICD-10-CM

## 2017-03-05 DIAGNOSIS — R2681 Unsteadiness on feet: Secondary | ICD-10-CM

## 2017-03-05 NOTE — Therapy (Signed)
[facility] [address] [place], [place], [address] Phone: [phone]   Fax:  [phone]  Physical Therapy Treatment  Patient Details  Name: Ashley [name] MRN: [id number] Date of Birth: [birth year] No Data Recorded  Encounter Date: 03/05/2017      PT End of Session - 03/05/17 1121    Visit Number 2   Number of Visits 17  PT requesting add'l 17 visits due to pt's incr. weakness after Dysport injections   Date for PT Re-Evaluation 04/25/17   Authorization Type BCBS: 77 PT visits   Authorization - Visit Number 4   Authorization - Number of Visits 30   PT Start Time 1102   PT Stop Time 1145   PT Time Calculation (min) 43 min   Equipment Utilized During Treatment --  min guard to S prn   Activity Tolerance Patient tolerated treatment well   Behavior During Therapy WFL for tasks assessed/performed      Past Medical History:  Diagnosis Date  . Anxiety   . ICH (intracerebral hemorrhage) ([place])   . Seizures ([place])   . Stroke [facility])     Past Surgical History:  Procedure Laterality Date  . DILATION AND CURETTAGE OF UTERUS      There were no vitals filed for this visit.      Subjective Assessment - 03/05/17 1105    Subjective Pt reports things going well, no pain, no falls.    Pertinent History Seizures, low BP   Patient Stated Goals "I want to swim, walk better, kick a soccer ball with children and run."   Currently in Pain? No/denies                         [facility] Adult PT Treatment/Exercise - 03/05/17 0001      Ambulation/Gait   Ambulation/Gait Yes   Ambulation/Gait Assistance 5: Supervision   Ambulation/Gait Assistance Details Pt reports having toe off AFO currently therefore wanted to better assess gait with this brace first.  Note good clearance, however she did demo intermittently increased hip and knee flex to fully clear RLE.  This clinic did not have small blue rocker at time of  this visit, therefore trialed reaction AFO.  Note that during gait, this provided good support to clear foot with decreased compensator strategy, however PT unsure of how this brace will perform during running.  PT to speak with Gerald Stabs at [facility] to determine options for running.     Ambulation Distance (Feet) 500 Feet   Assistive device None   Gait Pattern Decreased stance time - right;Decreased weight shift to right;Right foot flat;Decreased hip/knee flexion - right;Step-through pattern;Decreased step length - left;Trunk rotated posteriorly on right;Decreased dorsiflexion - right;Right hip hike   Ambulation Surface Level;Indoor   Stairs Yes   Stairs Assistance 5: Supervision   Stairs Assistance Details (indicate cue type and reason) Pt needing UE support esp when descending and note decreased ability to have forward translation of R tibia over ankle due to AFO.     Stair Management Technique One rail Right;Alternating pattern;Forwards   Number of Stairs 4   Height of Stairs 6     Neuro Re-ed    Neuro Re-ed Details  Had pt go over current HEP (exercises she could recall).  Performed SL clam exercise (continues to have difficulty, therefore maintained this exercise) x 10 reps, supine BLE bridging with abd (with band) x 10 reps (kept this exercise for improved RLE  control), discussed standing hip abduction with yellow band to work on improved RLE stance control, see pt instruction, supine hooklying elevating RLE off of mat to floor and back up with emphasis on slow controlled movement x 10 reps-added to HEP, step down with LLE for improved control/activation in RLE x 10 reps with UE support.  Added to HEP.  Pt with increased fatigue in R quad following step downs, but did very well.                  PT Education - 03/05/17 1943    Education provided Yes   Education Details trialing different braces at next session vs appt at [facility], modifying HEP   Person(s) Educated Patient   Methods  Explanation;Demonstration;Handout   Comprehension Verbalized understanding;Returned demonstration          PT Short Term Goals - 02/24/17 1658      PT SHORT TERM GOAL #1   Title Pt will be IND in progressed HEP to improve strength, balance and gait deviations. TARGET DATE FOR ALL STGS: 03/24/17   Status New     PT SHORT TERM GOAL #2   Title Pt will improve FGA score to >/=24/30 to decr. falls risk.   Status New     PT SHORT TERM GOAL #3   Title Pt will trial running on treadmill and write goal if appropriate.   Status New     PT SHORT TERM GOAL #4   Title Pt will descend 12 steps in step through pattern, without holding rails at MOD I level (incr. time) to carry objects at home.    Status New     PT SHORT TERM GOAL #5   Title Pt will amb. 500' without AD over even terrain, with R AFO donned, at MOD I level to improve functional mobility.    Status New           PT Long Term Goals - 02/24/17 1700      PT LONG TERM GOAL #1   Title Pt will improve FGA score to >/=27/30 to decr. falls risk. TARGET DATE FOR ALL LTGS: 04/21/17   Status New     PT LONG TERM GOAL #2   Title Pt will be able to kick a soccer ball for 5 minutes in order to play with her children.   Status New     PT LONG TERM GOAL #3   Title Trial Townsend Spry AFO on treadmill and write goal if indicated.   Status New     PT LONG TERM GOAL #4   Title Pt will amb. 1000' over uneven terrain outdoors, IND, in order to improve functional mobility.   Status New               Plan - 03/05/17 1946    Clinical Impression Statement Skilled session focused on gait with toe off vs reaction AFO during gait.  Note improved gait pattern with reaction, however unsure as to how this will translate to running.  Will trial blue rocker at next visit with jogging to better assess.  Also went over current HEP (to best of her knowledge) and modfied as needed.  tolerated all very well.     Rehab Potential Good   Clinical  Impairments Affecting Rehab Potential Seizures which may limit intensity of PT   PT Frequency 2x / week  requesting add't 2x/week for 8 weeks   PT Duration 8 weeks   PT Treatment/Interventions ADLs/Self Care Home  Management;Neuromuscular re-education;Cognitive remediation;Biofeedback;DME Instruction;Gait training;Stair training;Canalith Repostioning;Patient/family education;Orthotic Fit/Training;Balance training;Therapeutic exercise;Manual techniques;Therapeutic activities;Vestibular   PT Next Visit Plan assess jogging with blue rocker-if she feels is too stiff, then pt to make app[facility] at [facility] for external brace, RNMR, trunk strength/control   PT Home Exercise Plan Stretching/strength/balance HEP   Consulted and Agree with Plan of Care Patient      Patient will benefit from skilled therapeutic intervention in order to improve the following deficits and impairments:  Abnormal gait, Decreased endurance, Impaired sensation, Decreased knowledge of precautions, Decreased activity tolerance, Decreased knowledge of use of DME, Decreased strength, Impaired UE functional use, Impaired tone, Decreased balance, Decreased mobility, Decreased cognition, Decreased range of motion, Decreased safety awareness, Decreased coordination, Impaired flexibility, Postural dysfunction  Visit Diagnosis: Hemiplegia and hemiparesis following cerebral infarction affecting right dominant side (HCC)  Other abnormalities of gait and mobility  Other disturbances of skin sensation  Unsteadiness on feet     Problem List Patient Active Problem List   Diagnosis Date Noted  . Muscle weakness 02/03/2017  . History of hemorrhagic stroke with residual hemiparesis ([place]) 12/10/2016  . Spastic hemiplegia and hemiparesis affecting dominant side ([place]) 09/13/2015  . Adhesive capsulitis of right shoulder 08/24/2015  . Localization-related symptomatic epilepsy and epileptic syndromes with simple partial seizures, not intractable,  without status epilepticus ([place]) 08/21/2015  . Nontraumatic cortical hemorrhage of cerebral hemisphere ([place])   . Seizure ([place])   . Seizures ([place])   . Adjustment disorder with depressed mood   . Contracture of muscle ankle and foot 06/11/2015  . Hemiplga fol ntrm intcrbl hemor aff right dominant side ([place]) 06/06/2015  . Left-sided intracerebral hemorrhage ([place]) 06/04/2015  . Seizure disorder as sequela of cerebrovascular accident ([place])   . Seizure disorder ([place]) 06/03/2015  . Sepsis ([place]) 06/03/2015  . UTI (urinary tract infection) 06/03/2015  . Hypotension 06/02/2015  . Right spastic hemiparesis ([place]) 05/28/2015  . 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 History of anxiety disorder 05/28/2015  . ICH (intracerebral hemorrhage) ([place])   . Seizure disorder, nonconvulsive, with status epilepticus ([place])   . Cerebral venous thrombosis of cortical vein   . Cytotoxic cerebral edema ([place])   . IVH (intraventricular hemorrhage) ([place])   . Term pregnancy 05/07/2015  . Spontaneous vaginal delivery 05/07/2015    Cameron Sprang, PT, MPT [facility] [address] [place], [place], [address] Phone: [phone]   Fax:  [phone] 03/05/17, 7:51 PM  Name: Johnnye [place]ford MRN: [id number] Date of Birth: [birth year]

## 2017-03-05 NOTE — Patient Instructions (Signed)
ABDUCTION: Standing - Resistance Band (Active)    Stand, feet flat. Against yellow/red (red is harder) resistance band, lift left leg out to side. Complete _1__ sets of _10__ repetitions. Perform _1-2__ sessions per day.  http://gtsc.exer.us/117   Copyright  VHI. All rights reserved.      STEP DOWN - LATERAL  Start with both feet on top of a step/box (do this on the stairs now so that you have support). Next, slowly lower the left leg down off the side of the step/box to lightly touch whole foot to the floor. Then return to the original position with both feet on the step/box.   Maintain proper knee alignment: Knee in line with the 2nd toe and not passing in front of the toes.

## 2017-03-06 ENCOUNTER — Ambulatory Visit: Payer: BLUE CROSS/BLUE SHIELD | Admitting: Rehabilitation

## 2017-03-10 ENCOUNTER — Encounter: Payer: BLUE CROSS/BLUE SHIELD | Admitting: Occupational Therapy

## 2017-03-11 ENCOUNTER — Ambulatory Visit: Payer: BLUE CROSS/BLUE SHIELD

## 2017-03-12 ENCOUNTER — Encounter: Payer: BLUE CROSS/BLUE SHIELD | Admitting: Occupational Therapy

## 2017-03-12 ENCOUNTER — Ambulatory Visit: Payer: BLUE CROSS/BLUE SHIELD | Admitting: Rehabilitation

## 2017-03-17 ENCOUNTER — Encounter: Payer: Self-pay | Admitting: Occupational Therapy

## 2017-03-17 ENCOUNTER — Ambulatory Visit: Payer: BLUE CROSS/BLUE SHIELD | Attending: Physical Medicine & Rehabilitation | Admitting: Occupational Therapy

## 2017-03-17 ENCOUNTER — Ambulatory Visit: Payer: BLUE CROSS/BLUE SHIELD

## 2017-03-17 DIAGNOSIS — R2681 Unsteadiness on feet: Secondary | ICD-10-CM | POA: Diagnosis present

## 2017-03-17 DIAGNOSIS — R279 Unspecified lack of coordination: Secondary | ICD-10-CM | POA: Insufficient documentation

## 2017-03-17 DIAGNOSIS — I69351 Hemiplegia and hemiparesis following cerebral infarction affecting right dominant side: Secondary | ICD-10-CM | POA: Insufficient documentation

## 2017-03-17 DIAGNOSIS — R2689 Other abnormalities of gait and mobility: Secondary | ICD-10-CM | POA: Insufficient documentation

## 2017-03-17 DIAGNOSIS — M25611 Stiffness of right shoulder, not elsewhere classified: Secondary | ICD-10-CM | POA: Diagnosis present

## 2017-03-17 DIAGNOSIS — G8929 Other chronic pain: Secondary | ICD-10-CM | POA: Insufficient documentation

## 2017-03-17 DIAGNOSIS — R278 Other lack of coordination: Secondary | ICD-10-CM | POA: Diagnosis present

## 2017-03-17 DIAGNOSIS — I69851 Hemiplegia and hemiparesis following other cerebrovascular disease affecting right dominant side: Secondary | ICD-10-CM

## 2017-03-17 DIAGNOSIS — M25511 Pain in right shoulder: Secondary | ICD-10-CM | POA: Insufficient documentation

## 2017-03-17 DIAGNOSIS — R293 Abnormal posture: Secondary | ICD-10-CM | POA: Diagnosis present

## 2017-03-17 NOTE — Therapy (Signed)
[facility] [address] [place], [place], [address] Phone: [phone]   Fax:  [phone]  Occupational Therapy Treatment  Patient Details  Name: Ashley [name] MRN: [id number] Date of Birth: [birth year] No Data Recorded  Encounter Date: 03/17/2017      OT End of Session - 03/17/17 1244    Visit Number 54   Number of Visits 70   Date for OT Re-Evaluation 04/21/17   Authorization Type BCBS   Authorization Time Period pt called insurance company and verified that she has 30 visits TOTAL for PT and OT.     Authorization - Visit Number 3   Authorization - Number of Visits 15   OT Start Time 1017   OT Stop Time 1101   OT Time Calculation (min) 44 min   Activity Tolerance Patient tolerated treatment well      Past Medical History:  Diagnosis Date  . Anxiety   . ICH (intracerebral hemorrhage) ([place])   . Seizures ([place])   . Stroke [facility])     Past Surgical History:  Procedure Laterality Date  . DILATION AND CURETTAGE OF UTERUS      There were no vitals filed for this visit.      Subjective Assessment - 03/17/17 1020    Subjective  I think my arm is a little stronger   Pertinent History see epic snapshot; pt with SAH 2 weeks after deliverying her 3rd child   Patient Stated Goals to be able to use my R arm normally   Currently in Pain? No/denies                      OT Treatments/Exercises (OP) - 03/17/17 0001      Neurological Re-education Exercises   Other Exercises 1 Neuro re ed to address sit to stand from low surfaces - pt initially required cues and min facilitation for forward translation/weight shift however with practice pt able to complete with repetitions on her own. Pt to practice at home.  Also addressed overhead unilateral reach with resistance.  Pt with improving strength in RUE.  Addressed carrying 10 and 15 pounds in RUE while ambulatiing. Pt with min difficulty with 10 pound however  with 15 pounds, pt demonstated increased flexor synergy in RLE. Pt made aware at this puts pt at increased risk for falling. Pt verbalized understanding.                   OT Short Term Goals - 03/17/17 1243      OT SHORT TERM GOAL #1   Title Pt will be mod I with HEP - 03/24/2017   Status On-going     OT SHORT TERM GOAL #2   Title Pt will be able to go from sit to stand from low surfaces mod I.   Status Achieved     OT SHORT TERM GOAL #3   Title Pt will demonstrate ability to pick up items off floor without LOB   Status On-going           OT Long Term Goals - 03/17/17 1243      OT LONG TERM GOAL #1   Title Pt will be mod I with upgraded HEP - 04/21/2017   Status On-going     OT LONG TERM GOAL #2   Title Pt will be able to pick up 30 year old child out of crib and carry at least 10 feet    Status On-going  OT LONG TERM GOAL #3   Title Pt will demonstrate improved shoulder flexion to 130* to improve overhead reach for functional tasks   Status On-going     OT LONG TERM GOAL #4   Title Pt will be able to pick up heavy items from grocery store using both hands without LOB.   Status On-going               Plan - 03/17/17 1243    Clinical Impression Statement Pt progressing toward goals.  Pt feels her RUE is stronger than it was but not yet at baseline.    Rehab Potential Good   Current Impairments/barriers affecting progress:  impaired sensation and anxiety   OT Frequency 2x / week   OT Duration 8 weeks   OT Treatment/Interventions Self-care/ADL training;Aquatic Therapy;Therapeutic exercise;Neuromuscular education;Passive range of motion;Manual Therapy;Therapist, nutritional;Therapeutic activities;Patient/family education;Balance training   Plan address ROM for RUE for overhead reach with resistance, NMR for trunk and RUE functional use,Strengthening of RUE, balance, functional mobility   Consulted and Agree with Plan of Care Patient       Patient will benefit from skilled therapeutic intervention in order to improve the following deficits and impairments:  Abnormal gait, Decreased balance, Decreased range of motion, Decreased strength, Impaired UE functional use, Impaired sensation, Pain, Decreased mobility  Visit Diagnosis: Spastic hemiplegia of right dominant side as late effect of other cerebrovascular disease (HCC)  Unsteadiness on feet  Abnormal posture  Stiffness of right shoulder, not elsewhere classified    Problem List Patient Active Problem List   Diagnosis Date Noted  . Muscle weakness 02/03/2017  . History of hemorrhagic stroke with residual hemiparesis ([place]) 12/10/2016  . Spastic hemiplegia and hemiparesis affecting dominant side ([place]) 09/13/2015  . Adhesive capsulitis of right shoulder 08/24/2015  . Localization-related symptomatic epilepsy and epileptic syndromes with simple partial seizures, not intractable, without status epilepticus ([place]) 08/21/2015  . Nontraumatic cortical hemorrhage of cerebral hemisphere ([employer])   . Seizure ([place])   . Seizures ([place])   . Adjustment disorder with depressed mood   . Contracture of muscle ankle and foot 06/11/2015  . Hemiplga fol ntrm intcrbl hemor aff right dominant side ([place]) 06/06/2015  . Left-sided intracerebral hemorrhage ([place]) 06/04/2015  . Seizure disorder as sequela of cerebrovascular accident ([place])   . Seizure disorder ([place]) 06/03/2015  . Sepsis ([place]) 06/03/2015  . UTI (urinary tract infection) 06/03/2015  . Hypotension 06/02/2015  . Right spastic hemiparesis ([place]) 05/28/2015  . 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 History of anxiety disorder 05/28/2015  . ICH (intracerebral hemorrhage) ([place])   . Seizure disorder, nonconvulsive, with status epilepticus ([place])   . Cerebral venous thrombosis of cortical vein   . Cytotoxic cerebral edema ([place])   . IVH (intraventricular hemorrhage) ([place])   . Term pregnancy 05/07/2015  .  Spontaneous vaginal delivery 05/07/2015    Quay Burow, OTR/L 03/17/2017, 12:48 PM  [facility] [address] [place], [place], [address] Phone: [phone]   Fax:  [phone]  Name: Ashley [name] MRN: [id number] Date of Birth: May 22, 1976

## 2017-03-17 NOTE — Therapy (Signed)
[facility] [address] [place], [place], [address] Phone: [phone]   Fax:  [phone]  Physical Therapy Treatment  Patient Details  Name: Ashley [name] MRN: [id number] Date of Birth: Feb 17, 1976 No Data Recorded  Encounter Date: 03/17/2017      PT End of Session - 03/17/17 1300    Visit Number 3   Number of Visits 17   Date for PT Re-Evaluation 04/25/17   Authorization Type BCBS: 30 visits for PT/OT combined (-2 visits from last POC)   Authorization - Visit Number 5   Authorization - Number of Visits 15   PT Start Time 0932   PT Stop Time 1014   PT Time Calculation (min) 42 min   Equipment Utilized During Treatment --  hands on treadmill and min A-S prn   Activity Tolerance Patient tolerated treatment well   Behavior During Therapy [facility] for tasks assessed/performed      Past Medical History:  Diagnosis Date  . Anxiety   . ICH (intracerebral hemorrhage) ([place])   . Seizures ([place])   . Stroke [facility])     Past Surgical History:  Procedure Laterality Date  . DILATION AND CURETTAGE OF UTERUS      There were no vitals filed for this visit.      Subjective Assessment - 03/17/17 0934    Subjective Pt denied falls or changes since last visit. Pt missed last week due to her children were ill.    Pertinent History Seizures, low BP   Patient Stated Goals "I want to swim, walk better, kick a soccer ball with children and run."   Currently in Pain? No/denies                         [facility] Adult PT Treatment/Exercise - 03/17/17 0953      Ambulation/Gait   Ambulation/Gait Yes   Ambulation/Gait Assistance 5: Supervision;4: Min guard;4: Min assist   Ambulation/Gait Assistance Details Pt amb. and jogged on treadmill and over even terrain. Pt required min A during 2 episodes of LOB, one pt did fall. Fall was slowed by wall and PT assist, pt denied injury. Cues to decr. stance time in order to jog/run  (have 2 feet in swing phase at the same time). Pt practiced skipping/gallop to decr. stance time. Pt reported R blue rocker felt more supportive than her other braces.   Ambulation Distance (Feet) 50 Feet  x10 and treadmill training   Assistive device None;Other (Comment)  treadmill   Gait Pattern Decreased stance time - right;Decreased weight shift to right;Right foot flat;Decreased hip/knee flexion - right;Step-through pattern;Decreased step length - left;Trunk rotated posteriorly on right;Decreased dorsiflexion - right;Right hip hike   Ambulation Surface Indoor;Level                  PT Short Term Goals - 02/24/17 1658      PT SHORT TERM GOAL #1   Title Pt will be IND in progressed HEP to improve strength, balance and gait deviations. TARGET DATE FOR ALL STGS: 03/24/17   Status New     PT SHORT TERM GOAL #2   Title Pt will improve FGA score to >/=24/30 to decr. falls risk.   Status New     PT SHORT TERM GOAL #3   Title Pt will trial running on treadmill and write goal if appropriate.   Status New     PT SHORT TERM GOAL #4   Title Pt  will descend 12 steps in step through pattern, without holding rails at MOD I level (incr. time) to carry objects at home.    Status New     PT SHORT TERM GOAL #5   Title Pt will amb. 500' without AD over even terrain, with R AFO donned, at MOD I level to improve functional mobility.    Status New           PT Long Term Goals - 02/24/17 1700      PT LONG TERM GOAL #1   Title Pt will improve FGA score to >/=27/30 to decr. falls risk. TARGET DATE FOR ALL LTGS: 04/21/17   Status New     PT LONG TERM GOAL #2   Title Pt will be able to kick a soccer ball for 5 minutes in order to play with her children.   Status New     PT LONG TERM GOAL #3   Title Trial Townsend Spry AFO on treadmill and write goal if indicated.   Status New     PT LONG TERM GOAL #4   Title Pt will amb. 1000' over uneven terrain outdoors, IND, in order to improve  functional mobility.   Status New               Plan - 03/17/17 1301    Clinical Impression Statement Today's skilled session focused on gait training with R blue rocker for jogging. Pt did experience one fall during session due to LOB, but denied injury as PT and wall helped to control the fall. PT would like to determine if Drue Flirt Spry AFO would be better for pt vs. blue rocker, will confer with orthotist. Continue with POC.    Rehab Potential Good   Clinical Impairments Affecting Rehab Potential Seizures which may limit intensity of PT   PT Frequency 2x / week  requesting add't 2x/week for 8 weeks   PT Duration 8 weeks   PT Treatment/Interventions ADLs/Self Care Home Management;Neuromuscular re-education;Cognitive remediation;Biofeedback;DME Instruction;Gait training;Stair training;Canalith Repostioning;Patient/family education;Orthotic Fit/Training;Balance training;Therapeutic exercise;Manual techniques;Therapeutic activities;Vestibular   PT Next Visit Plan Continue jogging with blue rocker-confer with Gerald Stabs regarding external brace/Spry/blue rocker, RNMR, trunk strength/control   PT Home Exercise Plan Stretching/strength/balance HEP   Consulted and Agree with Plan of Care Patient      Patient will benefit from skilled therapeutic intervention in order to improve the following deficits and impairments:  Abnormal gait, Decreased endurance, Impaired sensation, Decreased knowledge of precautions, Decreased activity tolerance, Decreased knowledge of use of DME, Decreased strength, Impaired UE functional use, Impaired tone, Decreased balance, Decreased mobility, Decreased cognition, Decreased range of motion, Decreased safety awareness, Decreased coordination, Impaired flexibility, Postural dysfunction  Visit Diagnosis: Spastic hemiplegia of right dominant side as late effect of other cerebrovascular disease (HCC)  Unsteadiness on feet  Other abnormalities of gait and  mobility     Problem List Patient Active Problem List   Diagnosis Date Noted  . Muscle weakness 02/03/2017  . History of hemorrhagic stroke with residual hemiparesis ([place]) 12/10/2016  . Spastic hemiplegia and hemiparesis affecting dominant side ([place]) 09/13/2015  . Adhesive capsulitis of right shoulder 08/24/2015  . Localization-related symptomatic epilepsy and epileptic syndromes with simple partial seizures, not intractable, without status epilepticus ([place]) 08/21/2015  . Nontraumatic cortical hemorrhage of cerebral hemisphere ([place])   . Seizure ([place])   . Seizures ([place])   . Adjustment disorder with depressed mood   . Contracture of muscle ankle and foot 06/11/2015  . Hemiplga fol ntrm intcrbl  hemor aff right dominant side ([place]) 06/06/2015  . Left-sided intracerebral hemorrhage ([place]) 06/04/2015  . Seizure disorder as sequela of cerebrovascular accident ([place])   . Seizure disorder ([place]) 06/03/2015  . Sepsis ([place]) 06/03/2015  . UTI (urinary tract infection) 06/03/2015  . Hypotension 06/02/2015  . Right spastic hemiparesis ([place]) 05/28/2015  . 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 History of anxiety disorder 05/28/2015  . ICH (intracerebral hemorrhage) ([place])   . Seizure disorder, nonconvulsive, with status epilepticus ([place])   . Cerebral venous thrombosis of cortical vein   . Cytotoxic cerebral edema ([place])   . IVH (intraventricular hemorrhage) ([place])   . Term pregnancy 05/07/2015  . Spontaneous vaginal delivery 05/07/2015    Lon Klippel L 03/17/2017, 1:05 PM  [facility] [address] [place], [place], [address] Phone: [phone]   Fax:  [phone]  Name: Cieara Stierwalt MRN: [id number] Date of Birth: January 24, 1976  Geoffry Paradise, PT,DPT 03/17/17 1:05 PM Phone: [phone] Fax: [phone]

## 2017-03-19 ENCOUNTER — Encounter: Payer: Self-pay | Admitting: Occupational Therapy

## 2017-03-19 ENCOUNTER — Encounter: Payer: Self-pay | Admitting: Rehabilitation

## 2017-03-19 ENCOUNTER — Encounter: Payer: Self-pay | Admitting: Neurology

## 2017-03-19 ENCOUNTER — Ambulatory Visit: Payer: BLUE CROSS/BLUE SHIELD | Admitting: Rehabilitation

## 2017-03-19 ENCOUNTER — Ambulatory Visit (INDEPENDENT_AMBULATORY_CARE_PROVIDER_SITE_OTHER): Payer: BLUE CROSS/BLUE SHIELD | Admitting: Neurology

## 2017-03-19 ENCOUNTER — Ambulatory Visit: Payer: BLUE CROSS/BLUE SHIELD | Admitting: Occupational Therapy

## 2017-03-19 VITALS — BP 92/58 | HR 76 | Ht 63.0 in | Wt 124.0 lb

## 2017-03-19 DIAGNOSIS — F419 Anxiety disorder, unspecified: Secondary | ICD-10-CM

## 2017-03-19 DIAGNOSIS — F32A Depression, unspecified: Secondary | ICD-10-CM

## 2017-03-19 DIAGNOSIS — G40109 Localization-related (focal) (partial) symptomatic epilepsy and epileptic syndromes with simple partial seizures, not intractable, without status epilepticus: Secondary | ICD-10-CM

## 2017-03-19 DIAGNOSIS — I69851 Hemiplegia and hemiparesis following other cerebrovascular disease affecting right dominant side: Secondary | ICD-10-CM

## 2017-03-19 DIAGNOSIS — R2681 Unsteadiness on feet: Secondary | ICD-10-CM

## 2017-03-19 DIAGNOSIS — G8929 Other chronic pain: Secondary | ICD-10-CM

## 2017-03-19 DIAGNOSIS — R293 Abnormal posture: Secondary | ICD-10-CM

## 2017-03-19 DIAGNOSIS — M25611 Stiffness of right shoulder, not elsewhere classified: Secondary | ICD-10-CM

## 2017-03-19 DIAGNOSIS — F329 Major depressive disorder, single episode, unspecified: Secondary | ICD-10-CM | POA: Diagnosis not present

## 2017-03-19 DIAGNOSIS — I69359 Hemiplegia and hemiparesis following cerebral infarction affecting unspecified side: Secondary | ICD-10-CM

## 2017-03-19 DIAGNOSIS — M25511 Pain in right shoulder: Secondary | ICD-10-CM

## 2017-03-19 MED ORDER — LEVETIRACETAM 750 MG PO TABS: 1500.0000 mg | ORAL_TABLET | Freq: Two times a day (BID) | ORAL | 5 refills | Status: DC

## 2017-03-19 MED ORDER — LACOSAMIDE 100 MG PO TABS: ORAL_TABLET | ORAL | 5 refills | Status: DC

## 2017-03-19 NOTE — Progress Notes (Signed)
NEUROLOGY FOLLOW UP OFFICE NOTE  Ashley [name] [id number] [birth year]  HISTORY OF PRESENT ILLNESS: I had the pleasure of seeing Ashley [name] in follow-up in the neurology clinic on 03/19/2017. She is again accompanied by her mother who helps supplement the history today. 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 She reports feeling much better, her voice is much better, no further swallowing difficulties, no further ptosis. She can now lift her 42 year old son and does not struggle as much as she was previously. She continues to have falls, she fell last Tuesday with get foot getting caught, and is now again interested in trying a different ankle brace. They deny any further bigger seizures, but she had a spell a couple of weeks ago where she suddenly could not hear what her child was saying. She could see their mouths moving, she also could not speak and felt a little confused. No focal weakness, jerking, or twitching noted. It lasted 5 minutes. She reports 2 or 3 of these spells in the past 2 months. Her mother reports her OCD has gotten a little worse. She has a box of papers she checks every morning, it appears she has become more obsessive about checking it, she is scared to forget her appointments. She thinks it is normal and does not remember checking it very frequently. She is taking Vimpat 100mg  BID and Keppra 1500mg  BID without side effects. No staring/unresponsive episodes, gaps in time, right gaze deviation similar to prior.  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 She had focal seizures in 2016 with EEG showing left PLEDs and has been on Keppra 1500mg  BID since then with no further focal motor seizures, however she would have occasional episodes around once a  month where she could not get her words out, lasting 3-4 minutes. Sometimes she would get hot and have to sit but her speech would not be affected. On 11/27/16, she started having the same speech difficulties where she was making paraphasic errors, but it continued for 30 minutes and they decided to go to the ER. In the ER, she was noted to be aphasic, then as she was having a head CT, she suddenly had right gaze and head deviation, right arm and leg flexion and shaking for around 3 minutes. She was given IV Ativan but her right leg continued to shake in a rhythmic fashion. She was given additional Keppra and started on Vimpat with no further seizures, back to baseline in a couple of hours. She initially had some difficulties with the Vimpat 100mg  BID where she had hallucinations of hearing music or voices, but these have resolved over the past week. She denies any side effects on Keppra 1500mg  BID. She denies any dizziness, diplopia, gait instability. She has a right foot drop, denies any numbness/tingling on the right side but states it feels different that the other side. She denies any olfactory/gustatory hallucinations, myoclonic jerks, episodes of staring/unresponsiveness, gaps in time. She has not had any further episodes of paraphasic errors since her hospitalization. She had a mild diffuse headache for a couple of days but this has resolved. She denies any dizziness, dysarthria/dysphagia, neck/back pain, bowel/bladder dysfunction.  Records from her hospitalization in August 2016 were reviewed. She has been seeing neurologist Dr. Leonie Man  for post-stroke and seizure care since then, last visit was in August 2017. She had a severe headache for several days in August 2016 2 weeks post-partum. She then developed right-sided weakness, aphasia, then a focal seizure in the hospital. She was found to have a large left parietal ICH with IVH, cerebral edema, obstructive hydrocephalus and subarachnoid hemorrhage. MRA  and MRV were unremarkable. Concern was for cerebral cortical vein thrombosis not seen on MRV, however RCVS was also a consideration. She was treated with hypertonic saline and stabilized. She had a seizure with EEG showing left PLEDs. She was started on Keppra. Hypercoagulable labs were normal. She was discharged to rehab and had another seizure on 06/01/16 with report of eyes rolling back and upper extremities shaking. Keppra dose was increased. She had a repeat MRI brain in September 2016 with resolving subacute left parietal hematoma. There was acute gyral swelling and T2 hyperintensity in the left hippocampal formation, left insula, and operculum, that resolved with repeat MRI 09/2015 showing resolution of these changes. She was initially having some headaches and low dose Topamax was briefly added, then with resolution of headaches, this was tapered off. She had been dealing with post-stroke depression and symptoms had improved, however with limitations on driving again, she is starting to feel depressed again. She has three young children, the youngest is 39 months old. She was not happy with neuro rehab and has a private physical therapist coming weekly where she pushes herself and has made a ton of improvement. She does not like to wear her AFO and her husband did not notice any difference in gait with the AFO.   Epilepsy Risk Factors:  History of large left parietal hematoma with encephalomalacia. Otherwise she had a normal birth and early development.  There is no history of febrile convulsions, CNS infections such as meningitis/encephalitis, significant traumatic brain injury, neurosurgical procedures, or family history of seizures.  Diagnostic Data: I personally reviewed MRI brain 11/26/2016 which did not show any acute changes. There was left paramedian parietal encephalomalacia with extensive hemosiderin staining extending into the splenium of the corpus callosum, ex vacuo dilatation of the left  lateral ventricle occipital horn, increased side of the temporal horn of the left lateral ventricle which may be due to decreased volume of the hippocampus. It is smaller in size with increased signal compared to the right. EEG 11/27/16 showed mild left hemispheric slowing EEG 05/22/15: left hemisphere slowing, rare epileptiform discharges over the left anterior temporal region EEG 05/21/15: PLEDs on the left hemisphere, mainly involving the temporo-parietal region  Prior AEDs: Topamax PAST MEDICAL HISTORY: Past Medical History:  Diagnosis Date  . Anxiety   . ICH (intracerebral hemorrhage) ([place])   . Seizures ([place])   . Stroke [facility])     MEDICATIONS: Current Outpatient Prescriptions on File Prior to Visit  Medication Sig Dispense Refill  . aspirin 81 MG chewable tablet Chew 81 mg by mouth daily.    . Cholecalciferol (VITAMIN D) 2000 units CAPS Take 2,000 Units by mouth daily.    Marland Kitchen FLUoxetine (PROZAC) 40 MG capsule Take 40 mg by mouth daily.    . Lacosamide 100 MG TABS Take 1 tablet (100 mg total) by mouth 2 (two) times daily. 60 tablet 5  . levETIRAcetam (KEPPRA) 750 MG tablet Take 2 tablets (1,500 mg total) by mouth 2 (two) times daily. 120 tablet 5  . LORazepam (ATIVAN) 1 MG tablet Take 1 tablet as needed for seizure 10 tablet 3  . Multiple Vitamin (MULTIVITAMIN)  tablet Take 1 tablet by mouth daily.     Marland Kitchen sulfamethoxazole-trimethoprim (BACTRIM DS,SEPTRA DS) 800-160 MG tablet Take 1 tablet by mouth 2 (two) times daily as needed.  0   Current Facility-Administered Medications on File Prior to Visit  Medication Dose Route Frequency Provider Last Rate Last Dose  . gadopentetate dimeglumine (MAGNEVIST) injection 12 mL  12 mL Intravenous Once PRN Garvin Fila, MD        ALLERGIES: No Known Allergies  FAMILY HISTORY: Family History  Problem Relation Age of Onset  . Cancer Mother   . Cancer Father        pancreatic    SOCIAL HISTORY: Social History   Social History  . Marital  status: Married    Spouse name: N/A  . Number of children: N/A  . Years of education: N/A   Occupational History  . Not on file.   Social History Main Topics  . Smoking status: Never Smoker  . Smokeless tobacco: Never Used  . Alcohol use 0.6 oz/week    1 Glasses of wine per week     Comment: casual   . Drug use: No  . Sexual activity: Not on file   Other Topics Concern  . Not on file   Social History Narrative  . No narrative on file    REVIEW OF SYSTEMS: Constitutional: No fevers, chills, or sweats, no generalized fatigue, change in appetite Eyes: No visual changes, double vision, eye pain Ear, nose and throat: No hearing loss, ear pain, nasal congestion, sore throat Cardiovascular: No chest pain, palpitations Respiratory:  No shortness of breath at rest or with exertion, wheezes GastrointestinaI: No nausea, vomiting, diarrhea, abdominal pain, fecal incontinence Genitourinary:  No dysuria, urinary retention or frequency Musculoskeletal:  No neck pain, back pain Integumentary: No rash, pruritus, skin lesions Neurological: as above Psychiatric: No depression, insomnia, anxiety Endocrine: No palpitations, fatigue, diaphoresis, mood swings, change in appetite, change in weight, increased thirst Hematologic/Lymphatic:  No anemia, purpura, petechiae. Allergic/Immunologic: no itchy/runny eyes, nasal congestion, recent allergic reactions, rashes  PHYSICAL EXAM: Vitals:   03/19/17 1146  BP: (!) 92/58  Pulse: 76   General: No acute distress Head:  Normocephalic/atraumatic Neck: supple, no paraspinal tenderness, full range of motion Heart:  Regular rate and rhythm Lungs:  Clear to auscultation bilaterally Back: No paraspinal tenderness Skin/Extremities: No rash, no edema Neurological Exam: alert and oriented to person, place, and time. No aphasia or dysarthria. Fund of knowledge is appropriate.  Recent and remote memory are intact.  Attention and concentration are normal.     Able to name objects and repeat phrases. are normal.    Able to name objects and repeat phrases. Cranial nerves: CN I: not tested CN II: pupils equal, round and reactive to light, visual fields intact, fundi unremarkable. CN III, IV, VI:  full range of motion, no nystagmus, mild bilateral ptosis at baseline, not fatigable with sustained upgaze CN V: decreased on right V1-3 (unchanged) CN VII: upper and lower face symmetric CN VIII: hearing intact to finger rub CN IX, X: gag intact, uvula midline CN XI: sternocleidomastoid and trapezius muscles intact CN XII: tongue midline Bulk & Tone: increased tone on right LE (more on right ankle), no fasciculations. Motor: 0/5 right foot dorsiflexion, eversion/inversion, 2/5 plantarflexion. Otherwise 5/5 throughout with no pronator drift.  Sensation: decreased on right UE and LE (unchanged) Deep Tendon Reflexes: brisk +3 on the right UE and LE, no clonus, +2 on left UE and LE Plantar responses: downgoing  bilaterally Cerebellar: no incoordination on finger to nose testing Gait: spastic hemiparetic gait with foot circumduction (unchanged) Tremor: none  IMPRESSION: This is a pleasant 41 yo RH woman with a history of large left ICH and focal seizures in August 2016 with residual right foot drop, with no focal motor seizures on Keppra 1500mg  BID until hospital admission last 11/27/15. She was having brief episodes of paraphasic errors around every month, then on 11/27/15 had a prolonged episode where she was making paraphasic errors, followed by right-sided focal seizure. Vimpat 100mg  BID was added. She has been overall doing well except for 2-3 episodes the past 2 months where she suddenly could not hear what people are saying and could not speak. She will increase Vimpat to 150mg  BID and continue Keppra 1500mg  BID, keep a calendar of symptoms on higher dose of medication. She had previously presented with new symptoms of swallowing/chewing/speech difficulties,  blurred vision, hoarseness, bilateral proximal arm weakness when lifting things suggestive of systemic toxicity from botulinum toxin that she received on 01/05/17. She has had significant improvement in these symptoms now almost 3 months out. Her mother is concerned about worsening OCD, she is also reporting depression and anxiety and is interested in seeing a psychiatrist and therapist, referral will be sent to [facility]. She does not drive and is aware of [place] driving laws to stop driving until 6 months seizure-free. She will follow-up in 3 months and knows to call for any changes.   Thank you for allowing me to participate in her care.  Please do not hesitate to call for any questions or concerns.  The duration of this appointment visit was 25 minutes of face-to-face time with the patient.  Greater than 50% of this time was spent in counseling, explanation of diagnosis, planning of further management, and coordination of care.   Ellouise Newer, M.D.   CC: Dr. Brigitte Pulse, Dr. Letta Pate

## 2017-03-19 NOTE — Patient Instructions (Signed)
1. Increase Vimpat 100mg : Take 1.5 tablets twice a day 2. Continue Keppra 1500mg  twice a day 3. Refer to [facility] for psychiatry and therapy 4. Keep a calendar of your symptoms 5. Follow-up in 3 months, call for any changes  Seizure Precautions: 1. If medication has been prescribed for you to prevent seizures, take it exactly as directed.  Do not stop taking the medicine without talking to your doctor first, even if you have not had a seizure in a long time.   2. Avoid activities in which a seizure would cause danger to yourself or to others.  Don't operate dangerous machinery, swim alone, or climb in high or dangerous places, such as on ladders, roofs, or girders.  Do not drive unless your doctor says you may.  3. If you have any warning that you may have a seizure, lay down in a safe place where you can't hurt yourself.    4.  No driving for 6 months from last seizure, as per [facility].   Please refer to the following link on the [place] website for more information: http://www.epilepsyfoundation.org/answerplace/Social/driving/drivingu.cfm   5.  Maintain good sleep hygiene. Avoid alcohol.  6.  Notify your neurology if you are planning pregnancy or if you become pregnant.  7.  Contact your doctor if you have any problems that may be related to the medicine you are taking.  8.  Call 911 and bring the patient back to the ED if:        A.  The seizure lasts longer than 5 minutes.       B.  The patient doesn't awaken shortly after the seizure  C.  The patient has new problems such as difficulty seeing, speaking or moving  D.  The patient was injured during the seizure  E.  The patient has a temperature over 102 F (39C)  F.  The patient vomited and now is having trouble breathing

## 2017-03-19 NOTE — Therapy (Signed)
[facility] [address] [place], [place], [address] Phone: [phone]   Fax:  [phone]  Physical Therapy Treatment  Patient Details  Name: Ashley [name] MRN: [id number] Date of Birth: Sep 19, 1976 No Data Recorded  Encounter Date: 03/19/2017      PT End of Session - 03/19/17 1351    Visit Number 4   Number of Visits 17   Date for PT Re-Evaluation 04/25/17   Authorization Type [employer]: 30 visits for PT/OT combined (-2 visits from last POC)   Authorization - Visit Number 6   Authorization - Number of Visits 15   PT Start Time [phone]   PT Stop Time 0932   PT Time Calculation (min) 45 min   Equipment Utilized During Treatment --  hands on treadmill and min A-S prn   Activity Tolerance Patient tolerated treatment well   Behavior During Therapy [facility] for tasks assessed/performed      Past Medical History:  Diagnosis Date  . Anxiety   . ICH (intracerebral hemorrhage) ([place])   . Seizures ([place])   . Stroke [facility])     Past Surgical History:  Procedure Laterality Date  . DILATION AND CURETTAGE OF UTERUS      There were no vitals filed for this visit.      Subjective Assessment - 03/19/17 0851    Subjective Fell off a bench, R leg gave way under her, no injury   Pertinent History Seizures, low BP   Patient Stated Goals "I want to swim, walk better, kick a soccer ball with children and run."   Currently in Pain? No/denies                         [facility] Adult PT Treatment/Exercise - 03/19/17 0001      Ambulation/Gait   Ambulation/Gait Yes   Ambulation/Gait Assistance 5: Supervision;4: Min assist   Ambulation/Gait Assistance Details Gerald Stabs from [place] present during session today to look at jogging with blue rocker AFO on RLE vs other bracing options.  First had pt ambulate with blue rocker.  She tends to continue to hike R hip and keep R hip retracted, however feel that a lot of this is due to poor  habit as she does not wear AFO at all times.  Then had her perform light jog x 3 reps in 60' area with blue rocker AFO.  Note that she demos heavy plantar flexion at initial contact.  Due to this reason, Gerald Stabs felt that blue rocker would likely not hold up as well and therefore recommended Exo/SYM brace as it is set into plantar flexion and allows spring reaction during running to keep her moving.  Pt interested and wanting to pursue, therefore will ask for order from MD.     Ambulation Distance (Feet) 115 Feet  x 2 reps, 60' x 3 reps   Assistive device None  R AFO   Gait Pattern Decreased stance time - right;Decreased weight shift to right;Right foot flat;Decreased hip/knee flexion - right;Step-through pattern;Decreased step length - left;Trunk rotated posteriorly on right;Decreased dorsiflexion - right;Right hip hike   Ambulation Surface Level;Indoor     Neuro Re-ed    Neuro Re-ed Details  Performed standing exercise at counter top in which pt had to reach up and to the L for targets, coming up onto R toe promoting L trunk elongation and R trunk shortening.  Pt did very well with this task as it could be made functional.  Performed several reps.  Then transitioned to sitting on large rocker board having PT lead her LUE to target to promote same L trunk elongation and R trunk shortening.  tolerated very well with cues to push R hip back to midline. Note that PT did have her try and dissociate hips with lateral movements, however this was difficult as pt tends to incorporate shoulders/trunk.                  PT Education - 03/19/17 1351    Education provided Yes   Education Details benefits of running specific brace   Person(s) Educated Patient   Methods Explanation   Comprehension Verbalized understanding          PT Short Term Goals - 02/24/17 1658      PT SHORT TERM GOAL #1   Title Pt will be IND in progressed HEP to improve strength, balance and gait deviations. TARGET DATE FOR  ALL STGS: 03/24/17   Status New     PT SHORT TERM GOAL #2   Title Pt will improve FGA score to >/=24/30 to decr. falls risk.   Status New     PT SHORT TERM GOAL #3   Title Pt will trial running on treadmill and write goal if appropriate.   Status New     PT SHORT TERM GOAL #4   Title Pt will descend 12 steps in step through pattern, without holding rails at MOD I level (incr. time) to carry objects at home.    Status New     PT SHORT TERM GOAL #5   Title Pt will amb. 500' without AD over even terrain, with R AFO donned, at MOD I level to improve functional mobility.    Status New           PT Long Term Goals - 02/24/17 1700      PT LONG TERM GOAL #1   Title Pt will improve FGA score to >/=27/30 to decr. falls risk. TARGET DATE FOR ALL LTGS: 04/21/17   Status New     PT LONG TERM GOAL #2   Title Pt will be able to kick a soccer ball for 5 minutes in order to play with her children.   Status New     PT LONG TERM GOAL #3   Title Trial [place]send Spry AFO on treadmill and write goal if indicated.   Status New     PT LONG TERM GOAL #4   Title Pt will amb. 1000' over uneven terrain outdoors, IND, in order to improve functional mobility.   Status New               Plan - 03/19/17 1352    Clinical Impression Statement Skilled session focused on addressing core NMR and Gerald Stabs from [place] being present to observe her in blue rocker AFO. Following this, he would recommend a Exo/SYM brace that is placed in plantar flexion as this is where she lands.  Pt shown video/demonstration of brace and is interested in pursuing as she used to run 5 miles per day and did do 5K/10K's and is extremely motivated to return to running.  will pursue order from MD.     Rehab Potential Good   Clinical Impairments Affecting Rehab Potential Seizures which may limit intensity of PT   PT Frequency 2x / week  requesting add't 2x/week for 8 weeks   PT Duration 8 weeks   PT Treatment/Interventions  ADLs/Self Care Home Management;Neuromuscular re-education;Cognitive remediation;Biofeedback;DME  Instruction;Gait training;Stair training;Canalith Repostioning;Patient/family education;Orthotic Fit/Training;Balance training;Therapeutic exercise;Manual techniques;Therapeutic activities;Vestibular   PT Next Visit Plan RNMR, trunk strength/control (Jenn she only wants to use about 8 visits due to visit limit so we need to keep checking with her to see if she wants to continue to take a break).    PT Home Exercise Plan Stretching/strength/balance HEP   Consulted and Agree with Plan of Care Patient      Patient will benefit from skilled therapeutic intervention in order to improve the following deficits and impairments:  Abnormal gait, Decreased endurance, Impaired sensation, Decreased knowledge of precautions, Decreased activity tolerance, Decreased knowledge of use of DME, Decreased strength, Impaired UE functional use, Impaired tone, Decreased balance, Decreased mobility, Decreased cognition, Decreased range of motion, Decreased safety awareness, Decreased coordination, Impaired flexibility, Postural dysfunction  Visit Diagnosis: Spastic hemiplegia of right dominant side as late effect of other cerebrovascular disease ([place])  Unsteadiness on feet  Abnormal posture     Problem List Patient Active Problem List   Diagnosis Date Noted  . Muscle weakness 02/03/2017  . History of hemorrhagic stroke with residual hemiparesis ([place]) 12/10/2016  . Spastic hemiplegia and hemiparesis affecting dominant side ([place]) 09/13/2015  . Adhesive capsulitis of right shoulder 08/24/2015  . 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 Nontraumatic cortical hemorrhage of cerebral hemisphere ([place])   . Seizure ([place])   . Seizures ([place])   . Adjustment disorder with depressed mood   . Contracture of muscle  ankle and foot 06/11/2015  . Hemiplga fol ntrm intcrbl hemor aff right dominant side ([place]) 06/06/2015  . Left-sided intracerebral hemorrhage ([place]) 06/04/2015  . Seizure disorder as sequela of cerebrovascular accident ([place])   . Seizure disorder ([place]) 06/03/2015  . Sepsis ([place]) 06/03/2015  . UTI (urinary tract infection) 06/03/2015  . Hypotension 06/02/2015  . Right spastic hemiparesis ([place]) 05/28/2015  . 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 History of anxiety disorder 05/28/2015  . ICH (intracerebral hemorrhage) ([place])   . Seizure disorder, nonconvulsive, with status epilepticus ([place])   . Cerebral venous thrombosis of cortical vein   . Cytotoxic cerebral edema ([place])   . IVH (intraventricular hemorrhage) ([place])   . Term pregnancy 05/07/2015  . Spontaneous vaginal delivery 05/07/2015    Cameron Sprang, PT, MPT [facility] [address] [place], [place], [address] Phone: [phone]   Fax:  [phone] 03/19/17, 1:56 PM  Name: Monta Maiorana MRN: [id number] Date of Birth: 08-13-1976

## 2017-03-19 NOTE — Therapy (Signed)
[facility] [address] [place], [place], [address] Phone: [phone]   Fax:  [phone]  Occupational Therapy Treatment  Patient Details  Name: Ashley [name] MRN: [id number] Date of Birth: 03-01-1976 No Data Recorded  Encounter Date: 03/19/2017      OT End of Session - 03/19/17 0930    Visit Number 14   Number of Visits 70   Date for OT Re-Evaluation 04/21/17   Authorization Type BCBS   Authorization Time Period pt called insurance company and verified that she has 30 visits TOTAL for PT and OT.     Authorization - Visit Number 4   Authorization - Number of Visits 15   OT Start Time [phone]   OT Stop Time 0845   OT Time Calculation (min) 38 min   Activity Tolerance Patient tolerated treatment well      Past Medical History:  Diagnosis Date  . Anxiety   . ICH (intracerebral hemorrhage) ([place])   . Seizures ([place])   . Stroke [facility])     Past Surgical History:  Procedure Laterality Date  . DILATION AND CURETTAGE OF UTERUS      There were no vitals filed for this visit.      Subjective Assessment - 03/19/17 0810    Subjective  Its been a crazy morning already   Pertinent History see epic snapshot; pt with SAH 2 weeks after deliverying her 3rd child   Patient Stated Goals to be able to use my R arm normally   Currently in Pain? No/denies                      OT Treatments/Exercises (OP) - 03/19/17 0001      Neurological Re-education Exercises   Other Exercises 1 Neuro re ed to address trunk, scapular and UE functional overhead reach, proximal stability, and strength. focus and emphasis on trunk and stabilizing muscles of the shoulder girdle as well as more normal movement pattern for overhead reach . Pt with improving reach (ROM) however has not yet returned to baseline for trunk and RUE strength.                   OT Short Term Goals - 03/19/17 2683      OT SHORT TERM GOAL #1   Title Pt will be mod I with HEP - 03/24/2017   Status On-going     OT SHORT TERM GOAL #2   Title Pt will be able to go from sit to stand from low surfaces mod I.   Status Achieved     OT SHORT TERM GOAL #3   Title Pt will demonstrate ability to pick up items off floor without LOB   Status On-going           OT Long Term Goals - 03/19/17 0929      OT LONG TERM GOAL #1   Title Pt will be mod I with upgraded HEP - 04/21/2017   Status On-going     OT LONG TERM GOAL #2   Title Pt will be able to pick up 72 year old child out of crib and carry at least 10 feet    Status On-going     OT LONG TERM GOAL #3   Title Pt will demonstrate improved shoulder flexion to 130* to improve overhead reach for functional tasks   Status On-going     OT LONG TERM GOAL #4   Title Pt will be  able to pick up heavy items from grocery store using both hands without LOB.   Status On-going               Plan - 03/19/17 0929    Clinical Impression Statement Pt porgressing toward goals. Pt with improved alignment and overhead reach AROM for functional tasks.    Rehab Potential Good   Current Impairments/barriers affecting progress:  impaired sensation and anxiety   OT Frequency 2x / week   OT Duration 8 weeks   OT Treatment/Interventions Self-care/ADL training;Aquatic Therapy;Therapeutic exercise;Neuromuscular education;Passive range of motion;Manual Therapy;Therapist, nutritional;Therapeutic activities;Patient/family education;Balance training   Plan address ROM for RUE for overheada reach with resistance, NMR for trunk and RUE functional use, strenght RUE, balance and functional mobility   Consulted and Agree with Plan of Care Patient      Patient will benefit from skilled therapeutic intervention in order to improve the following deficits and impairments:  Abnormal gait, Decreased balance, Decreased range of motion, Decreased strength, Impaired UE functional use, Impaired sensation, Pain,  Decreased mobility  Visit Diagnosis: Spastic hemiplegia of right dominant side as late effect of other cerebrovascular disease (HCC)  Unsteadiness on feet  Abnormal posture  Stiffness of right shoulder, not elsewhere classified  Chronic right shoulder pain    Problem List Patient Active Problem List   Diagnosis Date Noted  . Muscle weakness 02/03/2017  . History of hemorrhagic stroke with residual hemiparesis ([place]) 12/10/2016  . Spastic hemiplegia and hemiparesis affecting dominant side ([place]) 09/13/2015  . Adhesive capsulitis of right shoulder 08/24/2015  . Localization-related symptomatic epilepsy and epileptic syndromes with simple partial seizures, not intractable, without status epilepticus ([place]) 08/21/2015  . Nontraumatic cortical hemorrhage of cerebral hemisphere ([place])   . Seizure ([place])   . Seizures ([place])   . Adjustment disorder with depressed mood   . Contracture of muscle ankle and foot 06/11/2015  . Hemiplga fol ntrm intcrbl hemor aff right dominant side ([place]) 06/06/2015  . Left-sided intracerebral hemorrhage ([place]) 06/04/2015  . Seizure disorder as sequela of cerebrovascular accident ([place])   . Seizure disorder ([place]) 06/03/2015  . Sepsis ([place]) 06/03/2015  . UTI (urinary tract infection) 06/03/2015  . Hypotension 06/02/2015  . Right spastic hemiparesis ([place]) 05/28/2015  . 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 History of anxiety disorder 05/28/2015  . ICH (intracerebral hemorrhage) ([place])   . Seizure disorder, nonconvulsive, with status epilepticus ([place])   . Cerebral venous thrombosis of cortical vein   . Cytotoxic cerebral edema ([place])   . IVH (intraventricular hemorrhage) ([place])   . Term pregnancy 05/07/2015  . Spontaneous vaginal delivery 05/07/2015    Quay Burow, OTR/L 03/19/2017, 11:48 AM  [facility] [address] [place], [place], [address] Phone:  [phone]   Fax:  [phone]  Name: Ashley [name] MRN: [id number] Date of Birth: 1976/02/07

## 2017-03-23 ENCOUNTER — Ambulatory Visit: Payer: BLUE CROSS/BLUE SHIELD | Admitting: Rehabilitation

## 2017-03-23 ENCOUNTER — Encounter: Payer: Self-pay | Admitting: Rehabilitation

## 2017-03-23 ENCOUNTER — Encounter: Payer: Self-pay | Admitting: Neurology

## 2017-03-23 ENCOUNTER — Ambulatory Visit: Payer: BLUE CROSS/BLUE SHIELD | Admitting: Occupational Therapy

## 2017-03-23 ENCOUNTER — Encounter: Payer: Self-pay | Admitting: Occupational Therapy

## 2017-03-23 ENCOUNTER — Telehealth: Payer: Self-pay | Admitting: Rehabilitation

## 2017-03-23 DIAGNOSIS — I69851 Hemiplegia and hemiparesis following other cerebrovascular disease affecting right dominant side: Secondary | ICD-10-CM | POA: Diagnosis not present

## 2017-03-23 DIAGNOSIS — F419 Anxiety disorder, unspecified: Secondary | ICD-10-CM | POA: Insufficient documentation

## 2017-03-23 DIAGNOSIS — F329 Major depressive disorder, single episode, unspecified: Secondary | ICD-10-CM | POA: Insufficient documentation

## 2017-03-23 DIAGNOSIS — G8929 Other chronic pain: Secondary | ICD-10-CM

## 2017-03-23 DIAGNOSIS — R2681 Unsteadiness on feet: Secondary | ICD-10-CM

## 2017-03-23 DIAGNOSIS — M21371 Foot drop, right foot: Secondary | ICD-10-CM

## 2017-03-23 DIAGNOSIS — R293 Abnormal posture: Secondary | ICD-10-CM

## 2017-03-23 DIAGNOSIS — M25511 Pain in right shoulder: Secondary | ICD-10-CM

## 2017-03-23 DIAGNOSIS — I69351 Hemiplegia and hemiparesis following cerebral infarction affecting right dominant side: Secondary | ICD-10-CM

## 2017-03-23 DIAGNOSIS — R279 Unspecified lack of coordination: Secondary | ICD-10-CM

## 2017-03-23 DIAGNOSIS — R278 Other lack of coordination: Secondary | ICD-10-CM

## 2017-03-23 DIAGNOSIS — R2689 Other abnormalities of gait and mobility: Secondary | ICD-10-CM

## 2017-03-23 DIAGNOSIS — Z8739 Personal history of other diseases of the musculoskeletal system and connective tissue: Secondary | ICD-10-CM

## 2017-03-23 DIAGNOSIS — M25611 Stiffness of right shoulder, not elsewhere classified: Secondary | ICD-10-CM

## 2017-03-23 HISTORY — DX: Major depressive disorder, single episode, unspecified: F32.9

## 2017-03-23 NOTE — Therapy (Signed)
[facility] [address] [place], [place], [address] Phone: [phone]   Fax:  [phone]  Physical Therapy Treatment  Patient Details  Name: Ashley [name] MRN: [id number] Date of Birth: [birth year] No Data Recorded  Encounter Date: 03/23/2017      PT End of Session - 03/23/17 1346    Visit Number 5   Number of Visits 17   Date for PT Re-Evaluation 04/25/17   Authorization Type BCBS: 30 visits for PT/OT combined (-2 visits from last POC)   Authorization - Visit Number 7   Authorization - Number of Visits 15   PT Start Time 0933   PT Stop Time 1015   PT Time Calculation (min) 42 min   Equipment Utilized During Treatment --   Activity Tolerance Patient tolerated treatment well   Behavior During Therapy [facility] for tasks assessed/performed      Past Medical History:  Diagnosis Date  . Anxiety   . ICH (intracerebral hemorrhage) ([place])   . Seizures ([place])   . Stroke [facility])     Past Surgical History:  Procedure Laterality Date  . DILATION AND CURETTAGE OF UTERUS      There were no vitals filed for this visit.      Subjective Assessment - 03/23/17 0936    Subjective Pt with no falls, no changes.     Patient is accompained by: Family member   Pertinent History Seizures, low BP   Patient Stated Goals "I want to swim, walk better, kick a soccer ball with children and run."   Currently in Pain? No/denies                         [facility] Adult PT Treatment/Exercise - 03/23/17 0001      Neuro Re-ed    Neuro Re-ed Details  NMR for improved core, shoulder girdle, and RLE activation with quadruped moving R UE under body for core flexion and then moving RUE into extension.  Did on both sides x 10 reps with assist at R shoulder when in WB for stability and proper positioning.  Plank position on elbows elevating trunk but maintaining contact of knees to mat with tactile cues for proper shoulder/scap  positioning and light assist at R hip for equal elevation/activation, performed x 8 reps with 10 sec holds.  Forearms on physioball in tall kneeling moving ball forwards/backwards with emphasis on core activation x 10 reps.  Seated on physioball performing alternating marching to cone x 2 sets of 10 reps-tactile cues at R shoulder for decreased elevation.  Performed trunk dissociation moving hips to R and L maintaining stable core.  Pt did much better with this during this session vs previous session. Attempted to work on while reaching, however was difficult, therefore moved to mat without LE support and worked on reaching upward and to the L for R trunk shortening.  Ended with propped on R elow (in ER to decrease forward drop of shoulder) while elevating LUE over body to increase WB through R side as well as improved activation in core/shoulder girdle.                 PT Education - 03/23/17 1346    Education provided Yes   Education Details that PT did send note to MD for request of running brace order   Person(s) Educated Patient   Methods Explanation   Comprehension Verbalized understanding  PT Short Term Goals - 02/24/17 1658      PT SHORT TERM GOAL #1   Title Pt will be IND in progressed HEP to improve strength, balance and gait deviations. TARGET DATE FOR ALL STGS: 03/24/17   Status New     PT SHORT TERM GOAL #2   Title Pt will improve FGA score to >/=24/30 to decr. falls risk.   Status New     PT SHORT TERM GOAL #3   Title Pt will trial running on treadmill and write goal if appropriate.   Status New     PT SHORT TERM GOAL #4   Title Pt will descend 12 steps in step through pattern, without holding rails at MOD I level (incr. time) to carry objects at home.    Status New     PT SHORT TERM GOAL #5   Title Pt will amb. 500' without AD over even terrain, with R AFO donned, at MOD I level to improve functional mobility.    Status New           PT Long  Term Goals - 02/24/17 1700      PT LONG TERM GOAL #1   Title Pt will improve FGA score to >/=27/30 to decr. falls risk. TARGET DATE FOR ALL LTGS: 04/21/17   Status New     PT LONG TERM GOAL #2   Title Pt will be able to kick a soccer ball for 5 minutes in order to play with her children.   Status New     PT LONG TERM GOAL #3   Title Trial [place] Spry AFO on treadmill and write goal if indicated.   Status New     PT LONG TERM GOAL #4   Title Pt will amb. 1000' over uneven terrain outdoors, IND, in order to improve functional mobility.   Status New               Plan - 03/23/17 1348    Clinical Impression Statement Skilled session continues to focus on NMR for improved trunk/core, shoulder girdle and R LE activation with forced use and WB activities.  Tolerated all very well.  Pt continues to be limited due to decreased sensation as she does well within session, but note carryover is limited.     Rehab Potential Good   Clinical Impairments Affecting Rehab Potential Seizures which may limit intensity of PT   PT Frequency 2x / week  requesting add't 2x/week for 8 weeks   PT Duration 8 weeks   PT Treatment/Interventions ADLs/Self Care Home Management;Neuromuscular re-education;Cognitive remediation;Biofeedback;DME Instruction;Gait training;Stair training;Canalith Repostioning;Patient/family education;Orthotic Fit/Training;Balance training;Therapeutic exercise;Manual techniques;Therapeutic activities;Vestibular   PT Next Visit Plan Have conversation about holding PT until she gets running brace vs continuing (she wanted to use only half of visits to use some for later in year). RNMR, trunk strength/control Danise Mina she only wants to use about 8 visits due to visit limit so we need to keep checking with her to see if she wants to continue to take a break).    PT Home Exercise Plan Stretching/strength/balance HEP   Consulted and Agree with Plan of Care Patient      Patient will benefit  from skilled therapeutic intervention in order to improve the following deficits and impairments:  Abnormal gait, Decreased endurance, Impaired sensation, Decreased knowledge of precautions, Decreased activity tolerance, Decreased knowledge of use of DME, Decreased strength, Impaired UE functional use, Impaired tone, Decreased balance, Decreased mobility, Decreased cognition, Decreased range of  motion, Decreased safety awareness, Decreased coordination, Impaired flexibility, Postural dysfunction  Visit Diagnosis: Unsteadiness on feet  Other abnormalities of gait and mobility  Hemiplegia and hemiparesis following cerebral infarction affecting right dominant side (HCC)  Decreased coordination     Problem List Patient Active Problem List   Diagnosis Date Noted  . Depression 03/23/2017  . Anxiety 03/23/2017  . Muscle weakness 02/03/2017  . History of hemorrhagic stroke with residual hemiparesis ([place]) 12/10/2016  . Spastic hemiplegia and hemiparesis affecting dominant side ([place]) 09/13/2015  . Adhesive capsulitis of right shoulder 08/24/2015  . Localization-related symptomatic epilepsy and epileptic syndromes with simple partial seizures, not intractable, without status epilepticus ([place]) 08/21/2015  . Nontraumatic cortical hemorrhage of cerebral hemisphere ([place])   . Seizure ([place])   . Seizures ([place])   . Adjustment disorder with depressed mood   . Contracture of muscle ankle and foot 06/11/2015  . Hemiplga fol ntrm intcrbl hemor aff right dominant side ([place]) 06/06/2015  . Left-sided intracerebral hemorrhage ([place]) 06/04/2015  . Seizure disorder as sequela of cerebrovascular accident ([place])   . Seizure disorder ([place]) 06/03/2015  . Sepsis ([place]) 06/03/2015  . UTI (urinary tract infection) 06/03/2015  . Hypotension 06/02/2015  . Right spastic hemiparesis ([place]) 05/28/2015  . 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 History of anxiety disorder 05/28/2015  . ICH  (intracerebral hemorrhage) ([place])   . Seizure disorder, nonconvulsive, with status epilepticus ([place])   . Cerebral venous thrombosis of cortical vein   . Cytotoxic cerebral edema ([place])   . IVH (intraventricular hemorrhage) ([place])   . Term pregnancy 05/07/2015  . Spontaneous vaginal delivery 05/07/2015    Cameron Sprang, PT, MPT [facility] [address] [place], [place], [address] Phone: [phone]   Fax:  [phone] 03/23/17, 1:51 PM  Name: Ashley [name] MRN: [id number] Date of Birth: Jun 24, 1976

## 2017-03-23 NOTE — Telephone Encounter (Signed)
Dr. Letta Pate,    I am seeing Ashley [name] for OP neuro PT.  Note that she was avid runner prior to CVA, therefore have discussed bracing options with her and Brooke Pace from [facility].  Feel that she is appropriate for Exo/Sym brace.  If you agree, please write order for R specialized running AFO in EPIC or send via fax to [phone].   Thanks,  Cameron Sprang, PT, MPT [facility] [address] [place], [place], [address] Phone: [phone]   Fax:  [phone] 03/23/17, 7:44 AM

## 2017-03-23 NOTE — Therapy (Signed)
[facility] [address] [place], [place], [address] Phone: [phone]   Fax:  [phone]  Occupational Therapy Treatment  Patient Details  Name: Ashley [name] MRN: [id number] Date of Birth: 15-Jan-1976 No Data Recorded  Encounter Date: 03/23/2017      OT End of Session - 03/23/17 1318    Visit Number 20   Number of Visits 70   Date for OT Re-Evaluation 04/21/17   Authorization Type BCBS   Authorization Time Period pt called insurance company and verified that she has 30 visits TOTAL for PT and OT.     Authorization - Visit Number 5   Authorization - Number of Visits 15   OT Start Time 1016   OT Stop Time 1100   OT Time Calculation (min) 44 min   Activity Tolerance Patient tolerated treatment well      Past Medical History:  Diagnosis Date  . Anxiety   . ICH (intracerebral hemorrhage) ([place])   . Seizures ([place])   . Stroke [facility])     Past Surgical History:  Procedure Laterality Date  . DILATION AND CURETTAGE OF UTERUS      There were no vitals filed for this visit.      Subjective Assessment - 03/23/17 1024    Subjective  I feel like I  am slowly getting stronger   Pertinent History see epic snapshot; pt with SAH 2 weeks after deliverying her 3rd child   Patient Stated Goals to be able to use my R arm normally   Currently in Pain? No/denies                      OT Treatments/Exercises (OP) - 03/23/17 0001      Neurological Re-education Exercises   Other Exercises 1 Neuro re ed to address trunk control, scapular stabilization and overhead reach in sittng and standing. Pt's RUE in isolation is improving - pt continues with decreased trunk control and strength affecting overhead reach.                   OT Short Term Goals - 03/23/17 1316      OT SHORT TERM GOAL #1   Title Pt will be mod I with HEP - 03/24/2017   Status On-going     OT SHORT TERM GOAL #2   Title Pt will  be able to go from sit to stand from low surfaces mod I.   Status Achieved     OT SHORT TERM GOAL #3   Title Pt will demonstrate ability to pick up items off floor without LOB   Status On-going           OT Long Term Goals - 03/23/17 1317      OT LONG TERM GOAL #1   Title Pt will be mod I with upgraded HEP - 04/21/2017   Status On-going     OT LONG TERM GOAL #2   Title Pt will be able to pick up 40 year old child out of crib and carry at least 10 feet    Status On-going     OT LONG TERM GOAL #3   Title Pt will demonstrate improved shoulder flexion to 130* to improve overhead reach for functional tasks   Status On-going     OT LONG TERM GOAL #4   Title Pt will be able to pick up heavy items from grocery store using both hands without LOB.   Status On-going  Plan - 03/23/17 1317    Clinical Impression Statement Pt progressing with UE control and functional use.  Pt beginning to be able to do more activites again at home.    Rehab Potential Good   Current Impairments/barriers affecting progress:  impaired sensation and anxiety   OT Frequency 2x / week   OT Duration 8 weeks   OT Treatment/Interventions Self-care/ADL training;Aquatic Therapy;Therapeutic exercise;Neuromuscular education;Passive range of motion;Manual Therapy;Therapist, nutritional;Therapeutic activities;Patient/family education;Balance training   Plan NMR for trunk, RUE functional use, overhead reach, scap stabilization, balance and functional mobiity   Consulted and Agree with Plan of Care Patient      Patient will benefit from skilled therapeutic intervention in order to improve the following deficits and impairments:  Abnormal gait, Decreased balance, Decreased range of motion, Decreased strength, Impaired UE functional use, Impaired sensation, Pain, Decreased mobility  Visit Diagnosis: Spastic hemiplegia of right dominant side as late effect of other cerebrovascular disease  (HCC)  Unsteadiness on feet  Abnormal posture  Stiffness of right shoulder, not elsewhere classified  Chronic right shoulder pain    Problem List Patient Active Problem List   Diagnosis Date Noted  . Depression 03/23/2017  . Anxiety 03/23/2017  . Muscle weakness 02/03/2017  . History of hemorrhagic stroke with residual hemiparesis ([place]) 12/10/2016  . Spastic hemiplegia and hemiparesis affecting dominant side ([place]) 09/13/2015  . Adhesive capsulitis of right shoulder 08/24/2015  . Localization-related symptomatic epilepsy and epileptic syndromes with simple partial seizures, not intractable, without status epilepticus ([place]) 08/21/2015  . Nontraumatic cortical hemorrhage of cerebral hemisphere ([place])   . Seizure ([place])   . Seizures ([place])   . Adjustment disorder with depressed mood   . Contracture of muscle ankle and foot 06/11/2015  . Hemiplga fol ntrm intcrbl hemor aff right dominant side ([place]) 06/06/2015  . Left-sided intracerebral hemorrhage ([place]) 06/04/2015  . Seizure disorder as sequela of cerebrovascular accident ([place])   . Seizure disorder ([place]) 06/03/2015  . Sepsis ([place]) 06/03/2015  . UTI (urinary tract infection) 06/03/2015  . Hypotension 06/02/2015  . Right spastic hemiparesis ([place]) 05/28/2015  . 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 History of anxiety disorder 05/28/2015  . ICH (intracerebral hemorrhage) ([place])   . Seizure disorder, nonconvulsive, with status epilepticus ([place])   . Cerebral venous thrombosis of cortical vein   . Cytotoxic cerebral edema ([place])   . IVH (intraventricular hemorrhage) ([place])   . Term pregnancy 05/07/2015  . Spontaneous vaginal delivery 05/07/2015    Quay Burow, OTR/L 03/23/2017, 1:20 PM  [facility] [address] [place], [place], [address] Phone: [phone]   Fax:  [phone]  Name: Amalie Koran MRN: [id number] Date of  Birth: 07/05/1976

## 2017-03-24 ENCOUNTER — Ambulatory Visit: Payer: BLUE CROSS/BLUE SHIELD | Admitting: Occupational Therapy

## 2017-03-26 ENCOUNTER — Ambulatory Visit: Payer: BLUE CROSS/BLUE SHIELD | Admitting: Rehabilitation

## 2017-03-30 ENCOUNTER — Ambulatory Visit: Payer: BLUE CROSS/BLUE SHIELD | Admitting: Occupational Therapy

## 2017-03-30 ENCOUNTER — Ambulatory Visit: Payer: BLUE CROSS/BLUE SHIELD | Admitting: Rehabilitation

## 2017-03-30 ENCOUNTER — Encounter: Payer: Self-pay | Admitting: Occupational Therapy

## 2017-03-30 ENCOUNTER — Encounter: Payer: Self-pay | Admitting: Rehabilitation

## 2017-03-30 DIAGNOSIS — I69351 Hemiplegia and hemiparesis following cerebral infarction affecting right dominant side: Secondary | ICD-10-CM

## 2017-03-30 DIAGNOSIS — I69851 Hemiplegia and hemiparesis following other cerebrovascular disease affecting right dominant side: Secondary | ICD-10-CM | POA: Diagnosis not present

## 2017-03-30 DIAGNOSIS — R278 Other lack of coordination: Secondary | ICD-10-CM

## 2017-03-30 DIAGNOSIS — M25511 Pain in right shoulder: Secondary | ICD-10-CM

## 2017-03-30 DIAGNOSIS — M25611 Stiffness of right shoulder, not elsewhere classified: Secondary | ICD-10-CM

## 2017-03-30 DIAGNOSIS — R293 Abnormal posture: Secondary | ICD-10-CM

## 2017-03-30 DIAGNOSIS — R2681 Unsteadiness on feet: Secondary | ICD-10-CM

## 2017-03-30 DIAGNOSIS — G8929 Other chronic pain: Secondary | ICD-10-CM

## 2017-03-30 DIAGNOSIS — R2689 Other abnormalities of gait and mobility: Secondary | ICD-10-CM

## 2017-03-30 NOTE — Therapy (Signed)
[facility] [address] [place], [place], [address] Phone: [phone]   Fax:  [phone]  Physical Therapy Treatment  Patient Details  Name: Ashley [name] MRN: [id number] Date of Birth: 11/19/1975 No Data Recorded  Encounter Date: 03/30/2017      PT End of Session - 03/30/17 0937    Visit Number 6   Number of Visits 17   Date for PT Re-Evaluation 04/25/17   Authorization Type BCBS: 30 visits for PT/OT combined (-2 visits from last POC)   Authorization - Visit Number 8   Authorization - Number of Visits 15   PT Start Time 0933   PT Stop Time 1016   PT Time Calculation (min) 43 min   Activity Tolerance Patient tolerated treatment well   Behavior During Therapy [facility] for tasks assessed/performed      Past Medical History:  Diagnosis Date  . Anxiety   . ICH (intracerebral hemorrhage) ([place])   . Seizures ([place])   . Stroke [facility])     Past Surgical History:  Procedure Laterality Date  . DILATION AND CURETTAGE OF UTERUS      There were no vitals filed for this visit.      Subjective Assessment - 03/30/17 1125    Subjective Pt reports no changes since last visit, no falls   Patient is accompained by: Family member   Pertinent History Seizures, low BP   Patient Stated Goals "I want to swim, walk better, kick a soccer ball with children and run."   Currently in Pain? No/denies            [facility] PT Assessment - 03/30/17 0943      Functional Gait  Assessment   Gait assessed  Yes   Gait Level Surface Walks 20 ft in less than 7 sec but greater than 5.5 sec, uses assistive device, slower speed, mild gait deviations, or deviates 6-10 in outside of the 12 in walkway width.   Change in Gait Speed Able to smoothly change walking speed without loss of balance or gait deviation. Deviate no more than 6 in outside of the 12 in walkway width.   Gait with Horizontal Head Turns Performs head turns smoothly with slight  change in gait velocity (eg, minor disruption to smooth gait path), deviates 6-10 in outside 12 in walkway width, or uses an assistive device.   Gait with Vertical Head Turns Performs head turns with no change in gait. Deviates no more than 6 in outside 12 in walkway width.   Gait and Pivot Turn Pivot turns safely within 3 sec and stops quickly with no loss of balance.   Step Over Obstacle Is able to step over 2 stacked shoe boxes taped together (9 in total height) without changing gait speed. No evidence of imbalance.   Gait with Narrow Base of Support Ambulates less than 4 steps heel to toe or cannot perform without assistance.   Gait with Eyes Closed Walks 20 ft, no assistive devices, good speed, no evidence of imbalance, normal gait pattern, deviates no more than 6 in outside 12 in walkway width. Ambulates 20 ft in less than 7 sec.   Ambulating Backwards Walks 20 ft, no assistive devices, good speed, no evidence for imbalance, normal gait   Steps Alternating feet, must use rail.   Total Score 24   FGA comment: R AFO NOT donned  [facility] Adult PT Treatment/Exercise - 03/30/17 0001      Ambulation/Gait   Ambulation/Gait Yes   Ambulation/Gait Assistance 6: Modified independent (Device/Increase time);5: Supervision;4: Min guard   Ambulation/Gait Assistance Details Pt able to ambulate at mod I level in grass without any challenge.  Did note her R ankle tends to invert, esp if surface very uneven.  Pt is aware but due to lack of control/support and sensation is unable to prevent this.  Feel that she would likely be safer wearing AFO and discussed this with pt.  Also discussed PT speaking with Gerald Stabs from [employer] regarding an orthotic for shoe that is built up on the outer portion of foot to prevent ankle from inverting during gait on uneven surfaces.  Pt agreeable to PT doing this.  When given challenge of kicking soccer ball, PT feels she is much more unstable, esp at R  ankle and that she esp needs brace on for these activities from a safety and stability standpoint.  Pt verbalized understanding, however she tends to not want to wear brace at all at home.  Also looked briefly at her wearing a R foot up brace to see if this provided any assist with clearance.  Note that it does assist somewhat and allows her to hike hip less on R side, therefore would recommend this if she is willing to walk with it more to work on gait mechanics with foot up brace.  Again, she will need to take responsibility for her care and choosing to wear a brace is up to her.     Ambulation Distance (Feet) 800 Feet  total   Assistive device None   Gait Pattern Decreased stance time - right;Decreased weight shift to right;Right foot flat;Decreased hip/knee flexion - right;Step-through pattern;Decreased step length - left;Trunk rotated posteriorly on right;Decreased dorsiflexion - right;Right hip hike   Ambulation Surface Level;Unlevel;Indoor;Outdoor;Paved;Gravel;Grass   Stairs Yes   Stairs Assistance 6: Modified independent (Device/Increase time)   Stair Management Technique One rail Right;Alternating pattern;Forwards   Number of Stairs 4   Height of Stairs 6                PT Education - 03/30/17 1012    Education provided Yes   Education Details see gait section   Person(s) Educated Patient   Methods Explanation   Comprehension Verbalized understanding          PT Short Term Goals - 03/30/17 1132      PT SHORT TERM GOAL #1   Title Pt will be IND in progressed HEP to improve strength, balance and gait deviations. TARGET DATE FOR ALL STGS: 03/24/17   Status Achieved     PT SHORT TERM GOAL #2   Title Pt will improve FGA score to >/=24/30 to decr. falls risk.   Baseline 24/30 on 03/30/17   Status Achieved     PT SHORT TERM GOAL #3   Title Pt will trial running on treadmill and write goal if appropriate.   Status On-going     PT SHORT TERM GOAL #4   Title Pt will  descend 12 steps in step through pattern, without holding rails at MOD I level (incr. time) to carry objects at home.    Status Not Met     PT SHORT TERM GOAL #5   Title Pt will amb. 500' without AD over even terrain, with R AFO donned, at MOD I level to improve functional mobility.    Status Achieved  PT Long Term Goals - 03/30/17 5638      PT LONG TERM GOAL #1   Title Pt will improve FGA score to >/=27/30 to decr. falls risk. TARGET DATE FOR ALL LTGS: 04/21/17   Baseline 24/30 on 03/30/17   Status Partially Met     PT LONG TERM GOAL #2   Title Pt will be able to kick a soccer ball for 5 minutes in order to play with her children.   Baseline Feel that she would be able to do with brace donned, however do not feel that she is safe enough to do without brace at this time 03/30/17   Status Partially Met     PT LONG TERM GOAL #3   Title Trial [place] Spry AFO on treadmill and write goal if indicated.   Baseline Is meeting with Gerald Stabs to have custom running brace made in July   Status Achieved     PT LONG TERM GOAL #4   Title Pt will amb. 1000' over uneven terrain outdoors, IND, in order to improve functional mobility.   Status Achieved               Plan - 03/30/17 1132    Clinical Impression Statement Session focused on addressing STGs/LTGs to assess progress.  Note that she has met 3/5 STGs, not meeting goal for stairs and PT has not assessed gait on treadmill yet due to wanting to wait on running brace.  For LTGs, she has met 1/4 goals, however is making progress and feel that if she were more compliant wearing brace, she could meet these goals.  PT is placing pt on hold at this time due to visit limit and wanting to save visits until she gets running brace.  PT to set new re-cert and new/ongoing goals when pt return.  Pt verbalized understanding.    Rehab Potential Good   Clinical Impairments Affecting Rehab Potential Seizures which may limit intensity of PT   PT  Frequency 2x / week  requesting add't 2x/week for 8 weeks   PT Duration 8 weeks   PT Treatment/Interventions ADLs/Self Care Home Management;Neuromuscular re-education;Cognitive remediation;Biofeedback;DME Instruction;Gait training;Stair training;Canalith Repostioning;Patient/family education;Orthotic Fit/Training;Balance training;Therapeutic exercise;Manual techniques;Therapeutic activities;Vestibular   PT Next Visit Plan Placed pt on hold until she gets running brace.  When she comes back, she should have-assess running/high level activities both indoors/outdoors, treadmill, re-cert and write new goals based on this.     PT Home Exercise Plan Stretching/strength/balance HEP   Consulted and Agree with Plan of Care Patient      Patient will benefit from skilled therapeutic intervention in order to improve the following deficits and impairments:  Abnormal gait, Decreased endurance, Impaired sensation, Decreased knowledge of precautions, Decreased activity tolerance, Decreased knowledge of use of DME, Decreased strength, Impaired UE functional use, Impaired tone, Decreased balance, Decreased mobility, Decreased cognition, Decreased range of motion, Decreased safety awareness, Decreased coordination, Impaired flexibility, Postural dysfunction  Visit Diagnosis: Unsteadiness on feet  Other abnormalities of gait and mobility  Other lack of coordination  Hemiplegia and hemiparesis following cerebral infarction affecting right dominant side [facility])     Problem List Patient Active Problem List   Diagnosis Date Noted  . Depression 03/23/2017  . Anxiety 03/23/2017  . Muscle weakness 02/03/2017  . History of hemorrhagic stroke with residual hemiparesis ([place]) 12/10/2016  . Spastic hemiplegia and hemiparesis affecting dominant side ([place]) 09/13/2015  . Adhesive capsulitis of right shoulder 08/24/2015  . Localization-related symptomatic epilepsy and epileptic syndromes with  simple partial seizures, not  intractable, without status epilepticus ([place]) 08/21/2015  . Nontraumatic cortical hemorrhage of cerebral hemisphere ([place])   . Seizure ([place])   . Seizures ([place])   . Adjustment disorder with depressed mood   . Contracture of muscle ankle and foot 06/11/2015  . Hemiplga fol ntrm intcrbl hemor aff right dominant side ([place]) 06/06/2015  . Left-sided intracerebral hemorrhage ([place]) 06/04/2015  . Seizure disorder as sequela of cerebrovascular accident ([place])   . Seizure disorder ([place]) 06/03/2015  . Sepsis ([place]) 06/03/2015  . UTI (urinary tract infection) 06/03/2015  . Hypotension 06/02/2015  . Right spastic hemiparesis ([place]) 05/28/2015  . 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 History of anxiety disorder 05/28/2015  . ICH (intracerebral hemorrhage) ([place])   . Seizure disorder, nonconvulsive, with status epilepticus ([place])   . Cerebral venous thrombosis of cortical vein   . Cytotoxic cerebral edema ([place])   . IVH (intraventricular hemorrhage) ([place])   . Term pregnancy 05/07/2015  . Spontaneous vaginal delivery 05/07/2015    Cameron Sprang, PT, MPT [facility] [address] [place], [place], [address] Phone: [phone]   Fax:  [phone] 03/30/17, 11:42 AM  Name: Janasha Barkalow MRN: [id number] Date of Birth: 22-Sep-1976

## 2017-03-30 NOTE — Therapy (Signed)
[facility] [address] [place], [place], [address] Phone: [phone]   Fax:  [phone]  Occupational Therapy Treatment  Patient Details  Name: Ashley [name] MRN: [id number] Date of Birth: 1975-12-08 No Data Recorded  Encounter Date: 03/30/2017      OT End of Session - 03/30/17 1259    Visit Number 60   Number of Visits 28   Date for OT Re-Evaluation 04/21/17   Authorization Type [id number]   Authorization Time Period pt called insurance company and verified that she has 30 visits TOTAL for PT and OT.     Authorization - Visit Number 6   Authorization - Number of Visits 15   OT Start Time [phone]   OT Stop Time 0930   OT Time Calculation (min) 44 min   Activity Tolerance Patient tolerated treatment well      Past Medical History:  Diagnosis Date  . Anxiety   . ICH (intracerebral hemorrhage) ([place])   . Seizures ([place])   . Stroke [facility])     Past Surgical History:  Procedure Laterality Date  . DILATION AND CURETTAGE OF UTERUS      There were no vitals filed for this visit.      Subjective Assessment - 03/30/17 0847    Subjective  I am doing better but I still have a hard time lifting things   Pertinent History see epic snapshot; pt with SAH 2 weeks after deliverying her 3rd child   Patient Stated Goals to be able to use my R arm normally   Currently in Pain? No/denies                      OT Treatments/Exercises (OP) - 03/30/17 0001      Neurological Re-education Exercises   Other Exercises 1 Neuro re ed to address ability to engage core and increase use of LE"s when lifting heavier objects - also addressed balance in these activities. Addressed picking up lighweight ojbects off the floor and progressed to picking up heavier and heavier objects. Pt has decreased ability to pick up items the futher away from her they are.  Addresed core strength with lifting activities with attention to  biomechanics of lifting .  Also addressed active sit to stand and stand to sit off low surfaces attending to forward lean as well as pushing with  LE"s instead of using momentum.                   OT Short Term Goals - 03/30/17 1257      OT SHORT TERM GOAL #1   Title Pt will be mod I with HEP - 03/24/2017   Status Achieved     OT SHORT TERM GOAL #2   Title Pt will be able to go from sit to stand from low surfaces mod I.   Status Achieved     OT SHORT TERM GOAL #3   Title Pt will demonstrate ability to pick up items off floor without LOB   Status Achieved           OT Long Term Goals - 03/30/17 1258      OT LONG TERM GOAL #1   Title Pt will be mod I with upgraded HEP - 04/21/2017   Status On-going     OT LONG TERM GOAL #2   Title Pt will be able to pick up 75 year old child out of crib and carry at least 10  feet    Status On-going     OT LONG TERM GOAL #3   Title Pt will demonstrate improved shoulder flexion to 130* to improve overhead reach for functional tasks   Status On-going     OT LONG TERM GOAL #4   Title Pt will be able to pick up heavy items from grocery store using both hands without LOB.   Status On-going               Plan - 03/30/17 1258    Clinical Impression Statement Pt has met all STG 's and is progressing toward LTG's.  Pt is extremely motivated.    Rehab Potential Good   Current Impairments/barriers affecting progress:  impaired sensation and anxiety   OT Frequency 2x / week   OT Duration 8 weeks   OT Treatment/Interventions Self-care/ADL training;Aquatic Therapy;Therapeutic exercise;Neuromuscular education;Passive range of motion;Manual Therapy;Therapist, nutritional;Therapeutic activities;Patient/family education;Balance training   Plan NMR for trunk, RUE functional use, overhead reach, scap stabilization, balance and functional mobility   Consulted and Agree with Plan of Care Patient      Patient will benefit from skilled  therapeutic intervention in order to improve the following deficits and impairments:  Abnormal gait, Decreased balance, Decreased range of motion, Decreased strength, Impaired UE functional use, Impaired sensation, Pain, Decreased mobility  Visit Diagnosis: Spastic hemiplegia of right dominant side as late effect of other cerebrovascular disease (HCC)  Unsteadiness on feet  Abnormal posture  Stiffness of right shoulder, not elsewhere classified  Chronic right shoulder pain    Problem List Patient Active Problem List   Diagnosis Date Noted  . Depression 03/23/2017  . Anxiety 03/23/2017  . Muscle weakness 02/03/2017  . History of hemorrhagic stroke with residual hemiparesis ([place]) 12/10/2016  . Spastic hemiplegia and hemiparesis affecting dominant side ([place]) 09/13/2015  . Adhesive capsulitis of right shoulder 08/24/2015  . Localization-related symptomatic epilepsy and epileptic syndromes with simple partial seizures, not intractable, without status epilepticus ([place]) 08/21/2015  . Nontraumatic cortical hemorrhage of cerebral hemisphere ([place])   . Seizure ([place])   . Seizures ([place])   . Adjustment disorder with depressed mood   . Contracture of muscle ankle and foot 06/11/2015  . Hemiplga fol ntrm intcrbl hemor aff right dominant side ([place]) 06/06/2015  . Left-sided intracerebral hemorrhage ([place]) 06/04/2015  . Seizure disorder as sequela of cerebrovascular accident ([place])   . Seizure disorder ([place]) 06/03/2015  . Sepsis ([place]) 06/03/2015  . UTI (urinary tract infection) 06/03/2015  . Hypotension 06/02/2015  . Right spastic hemiparesis ([place]) 05/28/2015  . 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 History of anxiety disorder 05/28/2015  . ICH (intracerebral hemorrhage) ([place])   . Seizure disorder, nonconvulsive, with status epilepticus ([place])   . Cerebral venous thrombosis of cortical vein   . Cytotoxic cerebral edema ([place])   . IVH (intraventricular hemorrhage)  ([place])   . Term pregnancy 05/07/2015  . Spontaneous vaginal delivery 05/07/2015    Quay Burow, OTR/L 03/30/2017, 1:00 PM  [facility] [address] [place], [place], [address] Phone: [phone]   Fax:  [phone]  Name: Ashley [name] MRN: [id number] Date of Birth: 01-12-1976

## 2017-03-31 ENCOUNTER — Ambulatory Visit: Payer: BLUE CROSS/BLUE SHIELD | Admitting: Physical Medicine & Rehabilitation

## 2017-04-02 ENCOUNTER — Ambulatory Visit: Payer: BLUE CROSS/BLUE SHIELD | Admitting: Rehabilitation

## 2017-04-02 ENCOUNTER — Ambulatory Visit: Payer: BLUE CROSS/BLUE SHIELD | Admitting: Occupational Therapy

## 2017-04-06 ENCOUNTER — Encounter: Payer: Self-pay | Admitting: Occupational Therapy

## 2017-04-06 ENCOUNTER — Ambulatory Visit: Payer: BLUE CROSS/BLUE SHIELD | Admitting: Occupational Therapy

## 2017-04-06 ENCOUNTER — Ambulatory Visit: Payer: BLUE CROSS/BLUE SHIELD | Admitting: Rehabilitation

## 2017-04-06 DIAGNOSIS — I69851 Hemiplegia and hemiparesis following other cerebrovascular disease affecting right dominant side: Secondary | ICD-10-CM

## 2017-04-06 DIAGNOSIS — R2681 Unsteadiness on feet: Secondary | ICD-10-CM

## 2017-04-06 DIAGNOSIS — M25611 Stiffness of right shoulder, not elsewhere classified: Secondary | ICD-10-CM

## 2017-04-06 DIAGNOSIS — R293 Abnormal posture: Secondary | ICD-10-CM

## 2017-04-06 NOTE — Therapy (Signed)
[facility] [address] [place], [place], [address] Phone: [phone]   Fax:  [phone]  Occupational Therapy Treatment  Patient Details  Name: Ashley [name] MRN: [id number] Date of Birth: 03-Sep-1976 No Data Recorded  Encounter Date: 04/06/2017      OT End of Session - 04/06/17 0951    Visit Number 39   Number of Visits 80   Date for OT Re-Evaluation 04/21/17   Authorization Type BCBS 30 visit combined between PT and OT   Authorization Time Period pt called insurance company and verified that she has 30 visits TOTAL for PT and OT.     Authorization - Visit Number 7   Authorization - Number of Visits 15   OT Start Time 1607   OT Stop Time 0931   OT Time Calculation (min) 44 min   Activity Tolerance Patient tolerated treatment well      Past Medical History:  Diagnosis Date  . Anxiety   . ICH (intracerebral hemorrhage) ([place])   . Seizures ([place])   . Stroke [facility])     Past Surgical History:  Procedure Laterality Date  . DILATION AND CURETTAGE OF UTERUS      There were no vitals filed for this visit.      Subjective Assessment - 04/06/17 0850    Subjective  My aunt is in hospice and dying and that is why I have missed some sessions   Pertinent History see epic snapshot; pt with [facility]H 2 weeks after deliverying her 3rd child   Patient Stated Goals to be able to use my R arm normally   Currently in Pain? No/denies                      OT Treatments/Exercises (OP) - 04/06/17 0001      Neurological Re-education Exercises   Other Exercises 1 Neuro re ed to address overhead reach with first bilateral closed chain and progresssing to unilateral reach . Pt able to achieve 130* of shoulder flexion in bilateral closed chain. Will assess unilateral next session.  Addresesd proximal strengthening for posterior muscles of shoulder girdle in order to improve pt's ability to pick up heavier items (i.e  itmes in the grocery store, ability to lift child from crib)  Also addressed trunk control relative to those activities as well as postural alignment and using LE's to assist in lifting heavier items.                   OT Short Term Goals - 04/06/17 0949      OT SHORT TERM GOAL #1   Title Pt will be mod I with HEP - 03/24/2017   Status Achieved     OT SHORT TERM GOAL #2   Title Pt will be able to go from sit to stand from low surfaces mod I.   Status Achieved     OT SHORT TERM GOAL #3   Title Pt will demonstrate ability to pick up items off floor without LOB   Status Achieved           OT Long Term Goals - 04/06/17 0949      OT LONG TERM GOAL #1   Title Pt will be mod I with upgraded HEP - 04/21/2017   Status On-going     OT LONG TERM GOAL #2   Title Pt will be able to pick up 7 year old child out of crib and carry at least  10 feet    Status On-going     OT LONG TERM GOAL #3   Title Pt will demonstrate improved shoulder flexion to 130* to improve overhead reach for functional tasks   Status On-going     OT LONG TERM GOAL #4   Title Pt will be able to pick up heavy items from grocery store using both hands without LOB.   Status On-going               Plan - 04/06/17 0949    Clinical Impression Statement Pt working toward LTG"s. Pt states it is getting a little easier to pick up 42 year old - "not as easy as it was before the injection but it is improving.    Rehab Potential Good   Current Impairments/barriers affecting progress:  impaired sensation and anxiety   OT Frequency 2x / week   OT Duration 8 weeks   OT Treatment/Interventions Self-care/ADL training;Aquatic Therapy;Therapeutic exercise;Neuromuscular education;Passive range of motion;Manual Therapy;Therapist, nutritional;Therapeutic activities;Patient/family education;Balance training   Plan assess unilateral overhead reach ROM, NMR for trunk, RUE use, scap stabilization, balance and  functional mobility   Consulted and Agree with Plan of Care Patient      Patient will benefit from skilled therapeutic intervention in order to improve the following deficits and impairments:  Abnormal gait, Decreased balance, Decreased range of motion, Decreased strength, Impaired UE functional use, Impaired sensation, Pain, Decreased mobility  Visit Diagnosis: Unsteadiness on feet  Spastic hemiplegia of right dominant side as late effect of other cerebrovascular disease (HCC)  Abnormal posture  Stiffness of right shoulder, not elsewhere classified    Problem List Patient Active Problem List   Diagnosis Date Noted  . Depression 03/23/2017  . Anxiety 03/23/2017  . Muscle weakness 02/03/2017  . History of hemorrhagic stroke with residual hemiparesis ([place]) 12/10/2016  . Spastic hemiplegia and hemiparesis affecting dominant side ([place]) 09/13/2015  . Adhesive capsulitis of right shoulder 08/24/2015  . Localization-related symptomatic epilepsy and epileptic syndromes with simple partial seizures, not intractable, without status epilepticus ([place]) 08/21/2015  . Nontraumatic cortical hemorrhage of cerebral hemisphere ([place])   . Seizure ([place])   . Seizures ([place])   . Adjustment disorder with depressed mood   . Contracture of muscle ankle and foot 06/11/2015  . Hemiplga fol ntrm intcrbl hemor aff right dominant side ([place]) 06/06/2015  . Left-sided intracerebral hemorrhage ([place]) 06/04/2015  . Seizure disorder as sequela of cerebrovascular accident ([place])   . Seizure disorder ([place]) 06/03/2015  . Sepsis ([place]) 06/03/2015  . UTI (urinary tract infection) 06/03/2015  . Hypotension 06/02/2015  . Right spastic hemiparesis ([place]) 05/28/2015  . 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 History of anxiety disorder 05/28/2015  . ICH (intracerebral hemorrhage) ([place])   . Seizure disorder, nonconvulsive, with status epilepticus ([place])   . Cerebral venous thrombosis of cortical  vein   . Cytotoxic cerebral edema ([place])   . IVH (intraventricular hemorrhage) ([place])   . Term pregnancy 05/07/2015  . Spontaneous vaginal delivery 05/07/2015    Quay Burow, OTR/L 04/06/2017, 9:57 AM  [facility] [address] [place], [place], [address] Phone: [phone]   Fax:  [phone]  Name: Ashley [name] MRN: [id number] Date of Birth: July 07, 1976

## 2017-04-09 ENCOUNTER — Ambulatory Visit: Payer: BLUE CROSS/BLUE SHIELD | Admitting: Occupational Therapy

## 2017-04-09 ENCOUNTER — Ambulatory Visit: Payer: BLUE CROSS/BLUE SHIELD | Admitting: Rehabilitation

## 2017-04-13 ENCOUNTER — Encounter: Payer: Self-pay | Admitting: Occupational Therapy

## 2017-04-13 ENCOUNTER — Ambulatory Visit: Payer: BLUE CROSS/BLUE SHIELD | Admitting: Rehabilitation

## 2017-04-13 ENCOUNTER — Ambulatory Visit: Payer: BLUE CROSS/BLUE SHIELD | Attending: Physical Medicine & Rehabilitation | Admitting: Occupational Therapy

## 2017-04-13 DIAGNOSIS — M25611 Stiffness of right shoulder, not elsewhere classified: Secondary | ICD-10-CM | POA: Diagnosis present

## 2017-04-13 DIAGNOSIS — R2681 Unsteadiness on feet: Secondary | ICD-10-CM | POA: Insufficient documentation

## 2017-04-13 DIAGNOSIS — R293 Abnormal posture: Secondary | ICD-10-CM | POA: Diagnosis present

## 2017-04-13 DIAGNOSIS — G8929 Other chronic pain: Secondary | ICD-10-CM | POA: Diagnosis present

## 2017-04-13 DIAGNOSIS — M25511 Pain in right shoulder: Secondary | ICD-10-CM | POA: Diagnosis present

## 2017-04-13 DIAGNOSIS — I69851 Hemiplegia and hemiparesis following other cerebrovascular disease affecting right dominant side: Secondary | ICD-10-CM | POA: Insufficient documentation

## 2017-04-13 NOTE — Patient Instructions (Addendum)
Upgraded HEP:  Stretches; 1. Wall slides with L foot on block 2. ER stretch at edge of wall with elbow in extension and arm at "3:00" 3. Supine with hands behind head and ER  Exercises: 1. Supine with 3 pound weight unilateral shoulder flexion 2. Sitting with 2 pound weight in each hand, horizontal abduction 3. Standing with 2 pound weight in each hand, elbows extended and bringing weight back behind hips 4. Supine with 3 pound weight starting at shoulder, tucking chin and crossing over body to opposite hip with abdominal crunch (obliques)

## 2017-04-13 NOTE — Therapy (Signed)
[facility] [address] [place], [place], [address] Phone: [phone]   Fax:  [phone]  Occupational Therapy Treatment  Patient Details  Name: Ashley [name] MRN: [id number] Date of Birth: June 02, 1976 No Data Recorded  Encounter Date: 04/13/2017      OT End of Session - 04/13/17 1219    Visit Number 13   Number of Visits 79   Date for OT Re-Evaluation 04/21/17   Authorization Type BCBS 30 visit combined between PT and OT   Authorization Time Period pt called insurance company and verified that she has 30 visits TOTAL for PT and OT.     Authorization - Visit Number 8  pt wishes to use 9 visits for now and reserve other visits for later in the year   OT Start Time 0846   OT Stop Time 0929   OT Time Calculation (min) 43 min   Activity Tolerance Patient tolerated treatment well      Past Medical History:  Diagnosis Date  . Anxiety   . ICH (intracerebral hemorrhage) ([place])   . Seizures ([place])   . Stroke [facility])     Past Surgical History:  Procedure Laterality Date  . DILATION AND CURETTAGE OF UTERUS      There were no vitals filed for this visit.      Subjective Assessment - 04/13/17 0851    Subjective  last week was rough with my aunt dying but I am back on track this week   Pertinent History see epic snapshot; pt with SAH 2 weeks after deliverying her 3rd child   Patient Stated Goals to be able to use my R arm normally   Currently in Pain? No/denies                      OT Treatments/Exercises (OP) - 04/13/17 0001      ADLs   ADL Comments Addressed liftng heavy grocery items from floor to into "cart" in clinic- pt with moderate difficulty with 12 pack can of coke but able to accomplish.  Also addressed carrying heavy grocery items.       Neurological Re-education Exercises   Other Exercises 1 Modified and upgraded HEP -see pt instruction section for details. Pt able to return demonstrate  all activities.                  OT Education - 04/13/17 1216    Education provided Yes   Education Details upgraded HEP for [employer]) Educated Patient   Methods Explanation;Demonstration;Verbal cues;Other (comment)  videotaped on pt's phone   Comprehension Verbalized understanding;Returned demonstration          OT Short Term Goals - 04/13/17 1217      OT SHORT TERM GOAL #1   Title Pt will be mod I with HEP - 03/24/2017   Status Achieved     OT SHORT TERM GOAL #2   Title Pt will be able to go from sit to stand from low surfaces mod I.   Status Achieved     OT SHORT TERM GOAL #3   Title Pt will demonstrate ability to pick up items off floor without LOB   Status Achieved           OT Long Term Goals - 04/13/17 1217      OT LONG TERM GOAL #1   Title Pt will be mod I with upgraded HEP - 04/21/2017   Status Achieved  OT LONG TERM GOAL #2   Title Pt will be able to pick up 20 year old child out of crib and carry at least 10 feet    Status On-going     OT LONG TERM GOAL #3   Title Pt will demonstrate improved shoulder flexion to 130* to improve overhead reach for functional tasks   Status Achieved     OT LONG TERM GOAL #4   Title Pt will be able to pick up heavy items from grocery store using both hands without LOB.   Status On-going               Plan - 04/13/17 1218    Clinical Impression Statement Pt progressing toward goals. Pt with improved UE functional use and core strength.    Rehab Potential Good   Current Impairments/barriers affecting progress:  impaired sensation and anxiety   OT Frequency 2x / week   OT Duration 8 weeks   OT Treatment/Interventions Self-care/ADL training;Aquatic Therapy;Therapeutic exercise;Neuromuscular education;Passive range of motion;Manual Therapy;Therapist, nutritional;Therapeutic activities;Patient/family education;Balance training   Consulted and Agree with Plan of Care Patient      Patient  will benefit from skilled therapeutic intervention in order to improve the following deficits and impairments:  Abnormal gait, Decreased balance, Decreased range of motion, Decreased strength, Impaired UE functional use, Impaired sensation, Pain, Decreased mobility  Visit Diagnosis: Unsteadiness on feet  Spastic hemiplegia of right dominant side as late effect of other cerebrovascular disease (HCC)  Abnormal posture  Stiffness of right shoulder, not elsewhere classified  Chronic right shoulder pain    Problem List Patient Active Problem List   Diagnosis Date Noted  . Depression 03/23/2017  . Anxiety 03/23/2017  . Muscle weakness 02/03/2017  . History of hemorrhagic stroke with residual hemiparesis ([place]) 12/10/2016  . Spastic hemiplegia and hemiparesis affecting dominant side ([place]) 09/13/2015  . Adhesive capsulitis of right shoulder 08/24/2015  . Localization-related symptomatic epilepsy and epileptic syndromes with simple partial seizures, not intractable, without status epilepticus ([place]) 08/21/2015  . Nontraumatic cortical hemorrhage of cerebral hemisphere ([place])   . Seizure ([place])   . Seizures ([place])   . Adjustment disorder with depressed mood   . Contracture of muscle ankle and foot 06/11/2015  . Hemiplga fol ntrm intcrbl hemor aff right dominant side ([place]) 06/06/2015  . Left-sided intracerebral hemorrhage ([place]) 06/04/2015  . Seizure disorder as sequela of cerebrovascular accident ([place])   . Seizure disorder ([place]) 06/03/2015  . Sepsis ([place]) 06/03/2015  . UTI (urinary tract infection) 06/03/2015  . Hypotension 06/02/2015  . Right spastic hemiparesis ([place]) 05/28/2015  . 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 History of anxiety disorder 05/28/2015  . ICH (intracerebral hemorrhage) ([place])   . Seizure disorder, nonconvulsive, with status epilepticus ([place])   . Cerebral venous thrombosis of cortical vein   . Cytotoxic cerebral edema ([place])   . IVH  (intraventricular hemorrhage) ([place])   . Term pregnancy 05/07/2015  . Spontaneous vaginal delivery 05/07/2015    Quay Burow, OTR/L 04/13/2017, 12:21 PM  [facility] [address] [place], [place], [address] Phone: [phone]   Fax:  [phone]  Name: Darcey Cardy MRN: [id number] Date of Birth: 1976-06-22

## 2017-04-20 ENCOUNTER — Ambulatory Visit: Payer: BLUE CROSS/BLUE SHIELD | Admitting: Rehabilitation

## 2017-04-20 ENCOUNTER — Ambulatory Visit: Payer: BLUE CROSS/BLUE SHIELD | Admitting: Occupational Therapy

## 2017-04-20 ENCOUNTER — Encounter: Payer: Self-pay | Admitting: Occupational Therapy

## 2017-04-20 DIAGNOSIS — I69851 Hemiplegia and hemiparesis following other cerebrovascular disease affecting right dominant side: Secondary | ICD-10-CM

## 2017-04-20 DIAGNOSIS — R2681 Unsteadiness on feet: Secondary | ICD-10-CM | POA: Diagnosis not present

## 2017-04-20 DIAGNOSIS — G8929 Other chronic pain: Secondary | ICD-10-CM

## 2017-04-20 DIAGNOSIS — M25611 Stiffness of right shoulder, not elsewhere classified: Secondary | ICD-10-CM

## 2017-04-20 DIAGNOSIS — M25511 Pain in right shoulder: Secondary | ICD-10-CM

## 2017-04-20 DIAGNOSIS — R293 Abnormal posture: Secondary | ICD-10-CM

## 2017-04-20 NOTE — Therapy (Signed)
[facility] [address] [place], [place], [address] Phone: [phone]   Fax:  [phone]  Occupational Therapy Treatment  Patient Details  Name: Ashley [name] MRN: [id number] Date of Birth: August 30, 1976 No Data Recorded  Encounter Date: 04/20/2017      OT End of Session - 04/20/17 1313    Visit Number 17   Number of Visits 44   Date for OT Re-Evaluation 04/21/17   Authorization Type BCBS 30 visit combined between PT and OT   Authorization Time Period pt called insurance company and verified that she has 30 visits TOTAL for PT and OT.     Authorization - Visit Number 9   Authorization - Number of Visits 15   OT Start Time 0932   OT Stop Time 1016   OT Time Calculation (min) 44 min   Activity Tolerance Patient tolerated treatment well      Past Medical History:  Diagnosis Date  . Anxiety   . ICH (intracerebral hemorrhage) ([place])   . Seizures ([place])   . Stroke [facility])     Past Surgical History:  Procedure Laterality Date  . DILATION AND CURETTAGE OF UTERUS      There were no vitals filed for this visit.      Subjective Assessment - 04/20/17 0937    Subjective  It is much easier to pick up Ashley [name] from the crib this week   Pertinent History see epic snapshot; pt with SAH 2 weeks after deliverying her 3rd child   Patient Stated Goals to be able to use my R arm normally   Currently in Pain? No/denies                      OT Treatments/Exercises (OP) - 04/20/17 0001      Neurological Re-education Exercises   Other Exercises 1 Neuro re ed to address scapular stabilization with resistance in prone as well as sitting with incorporation of ER as well.  Also addresed overhead reach - in closed chain pt has full ROM for overhead and in unilateral reach pt has approximately 135*.  Addressed picking up heavy times from floor and placing on high table with cues to bend knees and power up with legs.  Pt  able to lift approximately 24 pounds in crate with BUE's with effort.  Also addressed improved alignment with functional ambulation as this impacts RUE proximal stability for heavy work.                   OT Short Term Goals - 04/20/17 1311      OT SHORT TERM GOAL #1   Title Pt will be mod I with HEP - 03/24/2017   Status Achieved     OT SHORT TERM GOAL #2   Title Pt will be able to go from sit to stand from low surfaces mod I.   Status Achieved     OT SHORT TERM GOAL #3   Title Pt will demonstrate ability to pick up items off floor without LOB   Status Achieved           OT Long Term Goals - 04/20/17 1312      OT LONG TERM GOAL #1   Title Pt will be mod I with upgraded HEP - 04/21/2017   Status Achieved     OT LONG TERM GOAL #2   Title Pt will be able to pick up 41 year old child out of crib  and carry at least 10 feet    Status Achieved     OT LONG TERM GOAL #3   Title Pt will demonstrate improved shoulder flexion to 130* to improve overhead reach for functional tasks   Status Achieved     OT LONG TERM GOAL #4   Title Pt will be able to pick up heavy items from grocery store using both hands without LOB.   Status On-going               Plan - 04/20/17 1312    Clinical Impression Statement Pt with good progress toward goals and reports greater ease in caring for 41 year old.    Rehab Potential Good   Current Impairments/barriers affecting progress:  impaired sensation and anxiety   OT Frequency 2x / week   OT Duration 8 weeks   OT Treatment/Interventions Self-care/ADL training;Aquatic Therapy;Therapeutic exercise;Neuromuscular education;Passive range of motion;Manual Therapy;Therapist, nutritional;Therapeutic activities;Patient/family education;Balance training   Plan Pt with limited visits therefore pt wishes to be placed on hold and to work on exercises at home and then will call when she is ready to return.   Consulted and Agree with Plan of  Care Patient      Patient will benefit from skilled therapeutic intervention in order to improve the following deficits and impairments:  Abnormal gait, Decreased balance, Decreased range of motion, Decreased strength, Impaired UE functional use, Impaired sensation, Pain, Decreased mobility  Visit Diagnosis: Unsteadiness on feet  Spastic hemiplegia of right dominant side as late effect of other cerebrovascular disease (HCC)  Abnormal posture  Stiffness of right shoulder, not elsewhere classified  Chronic right shoulder pain    Problem List Patient Active Problem List   Diagnosis Date Noted  . Depression 03/23/2017  . Anxiety 03/23/2017  . Muscle weakness 02/03/2017  . History of hemorrhagic stroke with residual hemiparesis ([place]) 12/10/2016  . Spastic hemiplegia and hemiparesis affecting dominant side ([place]) 09/13/2015  . Adhesive capsulitis of right shoulder 08/24/2015  . Localization-related symptomatic epilepsy and epileptic syndromes with simple partial seizures, not intractable, without status epilepticus ([place]) 08/21/2015  . Nontraumatic cortical hemorrhage of cerebral hemisphere ([place])   . Seizure ([place])   . Seizures ([place])   . Adjustment disorder with depressed mood   . Contracture of muscle ankle and foot 06/11/2015  . Hemiplga fol ntrm intcrbl hemor aff right dominant side ([place]) 06/06/2015  . Left-sided intracerebral hemorrhage ([place]) 06/04/2015  . Seizure disorder as sequela of cerebrovascular accident ([place])   . Seizure disorder ([place]) 06/03/2015  . Sepsis ([place]) 06/03/2015  . UTI (urinary tract infection) 06/03/2015  . Hypotension 06/02/2015  . Right spastic hemiparesis ([place]) 05/28/2015  . 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 History of anxiety disorder 05/28/2015  . ICH (intracerebral hemorrhage) ([place])   . Seizure disorder, nonconvulsive, with status epilepticus ([place])   . Cerebral venous thrombosis of cortical vein   . Cytotoxic  cerebral edema ([place])   . IVH (intraventricular hemorrhage) ([place])   . Term pregnancy 05/07/2015  . Spontaneous vaginal delivery 05/07/2015    Quay Burow, OTR/L 04/20/2017, 1:14 PM  [facility] [address] [place], [place], [address] Phone: [phone]   Fax:  [phone]  Name: Ashley [name] MRN: [id number] Date of Birth: 09-30-1976

## 2017-04-21 ENCOUNTER — Ambulatory Visit (HOSPITAL_COMMUNITY): Payer: BLUE CROSS/BLUE SHIELD | Admitting: Licensed Clinical Social Worker

## 2017-04-23 ENCOUNTER — Ambulatory Visit: Payer: BLUE CROSS/BLUE SHIELD | Admitting: Rehabilitation

## 2017-04-23 ENCOUNTER — Encounter: Payer: BLUE CROSS/BLUE SHIELD | Admitting: Occupational Therapy

## 2017-04-27 ENCOUNTER — Ambulatory Visit (INDEPENDENT_AMBULATORY_CARE_PROVIDER_SITE_OTHER): Payer: BLUE CROSS/BLUE SHIELD | Admitting: Licensed Clinical Social Worker

## 2017-04-27 ENCOUNTER — Encounter (HOSPITAL_COMMUNITY): Payer: Self-pay | Admitting: Licensed Clinical Social Worker

## 2017-04-27 DIAGNOSIS — F419 Anxiety disorder, unspecified: Secondary | ICD-10-CM | POA: Diagnosis not present

## 2017-04-27 DIAGNOSIS — F332 Major depressive disorder, recurrent severe without psychotic features: Secondary | ICD-10-CM
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
## 2017-05-04 ENCOUNTER — Encounter: Payer: Self-pay | Admitting: Physical Medicine & Rehabilitation

## 2017-05-04 ENCOUNTER — Ambulatory Visit (HOSPITAL_BASED_OUTPATIENT_CLINIC_OR_DEPARTMENT_OTHER): Payer: BLUE CROSS/BLUE SHIELD | Admitting: Physical Medicine & Rehabilitation

## 2017-05-04 ENCOUNTER — Encounter: Payer: BLUE CROSS/BLUE SHIELD | Attending: Physical Medicine & Rehabilitation

## 2017-05-04 VITALS — BP 100/66 | HR 72

## 2017-05-04 DIAGNOSIS — G8111 Spastic hemiplegia affecting right dominant side: Secondary | ICD-10-CM | POA: Diagnosis not present

## 2017-05-04 DIAGNOSIS — G8110 Spastic hemiplegia affecting unspecified side: Secondary | ICD-10-CM | POA: Diagnosis not present

## 2017-05-04 DIAGNOSIS — R2689 Other abnormalities of gait and mobility: Secondary | ICD-10-CM | POA: Insufficient documentation

## 2017-05-04 DIAGNOSIS — Z8673 Personal history of transient ischemic attack (TIA), and cerebral infarction without residual deficits: Secondary | ICD-10-CM | POA: Diagnosis not present

## 2017-05-04 DIAGNOSIS — Z808 Family history of malignant neoplasm of other organs or systems: Secondary | ICD-10-CM | POA: Diagnosis not present

## 2017-05-04 NOTE — Progress Notes (Signed)
Subjective:    Patient ID: Ashley [name], female    DOB: [birth year], 41 y.o.   MRN: [id number]  HPI Strength back to normal.  She can once again lift toddler over her head  Pt had a couple falls when trying to rush, has a loaner Toe off AFO which helps.  She has goals of returning to running and has a custom molded AFO on order. She has had several episodes of twisting her ankle as well as stubbing her toe. We discussed orthotic management including an order for blue rocker AFO, given that she has some knee hyperextension. Patient has decent quad strength Pain Inventory Average Pain 0 Pain Right Now 0 My pain is no pain  In the last 24 hours, has pain interfered with the following? General activity 0 Relation with others 0 Enjoyment of life 0 What TIME of day is your pain at its worst? no pain Sleep (in general) Fair  Pain is worse with: no pain Pain improves with: no pain Relief from Meds: no pain  Mobility walk without assistance how many minutes can you walk? 60 ability to climb steps?  yes do you drive?  yes  Function not employed: date last employed . Do you have any goals in this area?  yes  Neuro/Psych trouble walking depression  Prior Studies Any changes since last visit?  no  Physicians involved in your care Any changes since last visit?  no   Family History  Problem Relation Age of Onset  . Cancer Mother   . Cancer Father        pancreatic   Social History   Social History  . Marital status: Married    Spouse name: N/A  . Number of children: N/A  . Years of education: N/A   Social History Main Topics  . Smoking status: Never Smoker  . Smokeless tobacco: Never Used  . Alcohol use 0.6 oz/week    1 Glasses of wine per week     Comment: casual   . Drug use: No  . Sexual activity: Not Asked   Other Topics Concern  . None   Social History Narrative  . None   Past Surgical History:  Procedure Laterality Date  . DILATION AND CURETTAGE  OF UTERUS     Past Medical History:  Diagnosis Date  . Anxiety   . ICH (intracerebral hemorrhage) ([place])   . Seizures ([place])   . Stroke ([place])    BP 100/66   Pulse 72   SpO2 97%   Opioid Risk Score:   Fall Risk Score:  `1  Depression screen PHQ 2/9  Depression screen PHQ 2/9 05/04/2017  Decreased Interest 3  Down, Depressed, Hopeless 3  PHQ - 2 Score 6  Some recent data might be hidden    Review of Systems  HENT: Negative.   Eyes: Negative.   Respiratory: Negative.   Cardiovascular: Negative.   Gastrointestinal: Negative.   Endocrine: Negative.   Genitourinary: Negative.   Musculoskeletal: Positive for gait problem.  Skin: Negative.   Allergic/Immunologic: Negative.   Neurological: Positive for weakness.  Hematological: Negative.   Psychiatric/Behavioral: Positive for dysphoric mood.  All other systems reviewed and are negative.      Objective:   Physical Exam  Constitutional: She is oriented to person, place, and time. She appears well-developed and well-nourished.  HENT:  Head: Normocephalic and atraumatic.  Eyes: Pupils are equal, round, and reactive to light. Conjunctivae and EOM are normal.  Neck: Normal range  of motion.  Pulmonary/Chest: No respiratory distress. She has no wheezes.  Abdominal: She exhibits no distension. There is no tenderness.  Neurological: She is alert and oriented to person, place, and time. No sensory deficit. She exhibits abnormal muscle tone. Gait abnormal.  Patient has increased tone in the ankle inverters including posterior tibialis, anterior tibialis, and in the ankle plantar flexors. Ambulation without her shoe demonstrates toe curling, including great toe, there is mild hyperextension at the knee  Quad strength is 4/5 on the right side, 0 at the ankle dorsiflexor and plantar flexor. She is basically croak contracted at the ankle. Hip flexion is 4. Left lower extremity 5/5 in the hip flexor, knee extensor, ankle dorsi.  Skin:  Skin is warm and dry.  Psychiatric: She has a normal mood and affect. Her behavior is normal. Judgment and thought content normal.  Nursing note and vitals reviewed.         Assessment & Plan:  1. Right spastic hemiplegia status post postpartum left MCA distribution hemorrhage.    I have written a prescription for a carbon fiber AFO off the shelf which can be used around the house to prevent ankle inversion injury as well as foot drop  We discussed using her custom molded AFO more for running activities.  We also discussed avoiding ambulation with bare feet  Return to clinic 3 months. Will not reinject Botox given limited efficacy, did have evidence of excessive neuromuscular junction blockade in noninjected muscles after Dysport injection 1500 units. Will not repeat. We discussed other treatment options including tizanidine and baclofen, neither which the patient would like to pursue at current time.

## 2017-05-05 ENCOUNTER — Ambulatory Visit (INDEPENDENT_AMBULATORY_CARE_PROVIDER_SITE_OTHER): Payer: BLUE CROSS/BLUE SHIELD | Admitting: Licensed Clinical Social Worker

## 2017-05-05 ENCOUNTER — Encounter (HOSPITAL_COMMUNITY): Payer: Self-pay | Admitting: Licensed Clinical Social Worker

## 2017-05-05 DIAGNOSIS — F332 Major depressive disorder, recurrent severe without psychotic features: Secondary | ICD-10-CM

## 2017-05-05 DIAGNOSIS — F419 Anxiety disorder, unspecified: Secondary | ICD-10-CM | POA: Diagnosis not present

## 2017-05-05 NOTE — Progress Notes (Signed)
   THERAPIST PROGRESS NOTE  Session Time: 2:10-3pm  Participation Level: Active  Behavioral Response: CasualAlertAnxious  Type of Therapy: Individual Therapy  Treatment Goals addressed: Coping  Interventions: CBT/ Treatment planning  Summary: Ashley [name] is a 41 y.o. female who presents for her first individual counseling session. Spent a considerable amount of time building a trusting therapeutic relationship and treatment planning. Pt suffered a stroke 2 years ago after childbirth. SInce the stroke she has begun to rethink her life choices. She is not happy in her marriage. She cannot work due to the stroke. She is working extremely hard on regaining her full capacity. Her mother lives with her husband and 3 children, who she shares custody of 2 children with her first husband. Pt identified her goals for tx and we worked on strategies for her to achieve these goals. Gave pt handout on grounding and mindfulness meditation for her stressors.  Suicidal/Homicidal: Nowithout intent/plan  Therapist Response: Assessed pt's current functioning for baseline. Assisted pt in building a trusting and therapeutic relationship. Assisted pt in building her tx plan.  Plan: Return again in 2 weeks.  Diagnosis: Axis I: Anxiety and depression        [name],[name], LCAS 05/05/2017

## 2017-05-20 ENCOUNTER — Ambulatory Visit (INDEPENDENT_AMBULATORY_CARE_PROVIDER_SITE_OTHER): Payer: BLUE CROSS/BLUE SHIELD | Admitting: Licensed Clinical Social Worker

## 2017-05-20 ENCOUNTER — Encounter (HOSPITAL_COMMUNITY): Payer: Self-pay | Admitting: Licensed Clinical Social Worker

## 2017-05-20 DIAGNOSIS — F332 Major depressive disorder, recurrent severe without psychotic features: Secondary | ICD-10-CM | POA: Diagnosis not present

## 2017-05-20 NOTE — Progress Notes (Signed)
   THERAPIST PROGRESS NOTE  Session Time: 1:10-2pm  Participation Level: Active  Behavioral Response: CasualAlertAnxious  Type of Therapy: Individual Therapy  Treatment Goals addressed: Coping  Interventions: CBT  Summary: Ashley [name] is a 41 y.o. female who presents for her first individual counseling session. Pt presents sad/depressed today. Pt met with clinician for an individual session. Pt shared about her psychiatric symptoms, her current life events.Client and clinician discussed some basic cbt concepts. Client and clinician discussed how our thoughts affect our emotions and perceptions. Asked pt how she felt - She felt negative. Clinician asked what kind of questions would create solutions. Pt identified some and then answered them. She shared that this made her feel hopeful and capable of the tasks she needs to do. Pt shared her thoughts and insights. Clinician introduced grounding and mindfulness techniques. Clinician explained the process, purpose and practice of the techniques. She agreed to practice the techniques at home.    Suicidal/Homicidal: Nowithout intent/plan  Therapist Response: Assessed pt's current functioning by self report.. Assisted pt processing cbt concepts, grounding and mindfulness techniques.  Plan: Return again in 2 weeks.  Diagnosis: Axis I: Anxiety and depression        Azha Constantin S, LCAS 05/20/2017

## 2017-05-25 ENCOUNTER — Encounter (HOSPITAL_COMMUNITY): Payer: Self-pay | Admitting: Licensed Clinical Social Worker

## 2017-05-25 ENCOUNTER — Ambulatory Visit (INDEPENDENT_AMBULATORY_CARE_PROVIDER_SITE_OTHER): Payer: BLUE CROSS/BLUE SHIELD | Admitting: Licensed Clinical Social Worker

## 2017-05-25 DIAGNOSIS — F332 Major depressive disorder, recurrent severe without psychotic features: Secondary | ICD-10-CM | POA: Diagnosis not present

## 2017-05-25 NOTE — Progress Notes (Signed)
   THERAPIST PROGRESS NOTE  Session Time: 2:10-3pm  Participation Level: Active  Behavioral Response: CasualAlertAnxious  Type of Therapy: Individual Therapy  Treatment Goals addressed: Coping  Interventions: CBT  Summary: Ashley [name] is a 41 y.o. female who presents for her  individual counseling session. Pt presents sad/depressed today.  Pt shared about her psychiatric symptoms, her current life events. Pt is leaving this afternoon for a family beach trip. She is not excited and is only going for her children. She is questioning her decisions about her current marriage and feels she made a huge mistake. Discussed emotional regulation skills, challenging her beliefs with alternative thoughts. Asked pt open ended questions about her belief system. Pt answered challenging her own beliefs.      Suicidal/Homicidal: Nowithout intent/plan  Therapist Response: Assessed pt's current functioning by self report.. Assisted pt processing belief system, emotional regulation skills.  Plan: Return again in 2 weeks.  Diagnosis: Axis I: Anxiety and depression        Khalilah Hoke S, LCAS 05/25/2017

## 2017-06-01 ENCOUNTER — Ambulatory Visit (INDEPENDENT_AMBULATORY_CARE_PROVIDER_SITE_OTHER): Payer: BLUE CROSS/BLUE SHIELD | Admitting: Psychiatry

## 2017-06-01 ENCOUNTER — Encounter (HOSPITAL_COMMUNITY): Payer: Self-pay | Admitting: Psychiatry

## 2017-06-01 VITALS — BP 122/70 | HR 87 | Ht 63.0 in | Wt 121.8 lb

## 2017-06-01 DIAGNOSIS — F3341 Major depressive disorder, recurrent, in partial remission: Secondary | ICD-10-CM | POA: Diagnosis not present

## 2017-06-01 DIAGNOSIS — Z630 Problems in relationship with spouse or partner: Secondary | ICD-10-CM

## 2017-06-01 DIAGNOSIS — G4700 Insomnia, unspecified: Secondary | ICD-10-CM

## 2017-06-01 MED ORDER — FLUOXETINE HCL 40 MG PO CAPS: 40.0000 mg | ORAL_CAPSULE | Freq: Every day | ORAL | 1 refills | Status: DC

## 2017-06-01 MED ORDER — MIRTAZAPINE 15 MG PO TABS: 15.0000 mg | ORAL_TABLET | Freq: Every day | ORAL | 1 refills | Status: DC

## 2017-06-01 MED ORDER — FLUOXETINE HCL 20 MG PO CAPS: 20.0000 mg | ORAL_CAPSULE | Freq: Every day | ORAL | 1 refills | Status: DC

## 2017-06-01 NOTE — Progress Notes (Signed)
Psychiatric Initial Adult Assessment   Patient Identification: Charice Zuno MRN:  [id number] Date of Evaluation:  06/01/2017 Referral Source: beth Chief Complaint:  depression, sleep problems Visit Diagnosis:    ICD-10-CM   1. Recurrent major depressive disorder, in partial remission (HCC) F33.41 mirtazapine (REMERON) 15 MG tablet    History of Present Illness:  Aviyanna Colbaugh is a 41 year old female with a history of hemorrhagic stroke, currently working with Eustaquio Maize in individual therapy. She has had significant depression in the setting of her recent neuro cognitive changes, right sided weakness. She continues to progress in her recovery, but her progress has been complicated by struggle with seizures as well. The hemorrhagic stroke was in the setting of postpartum eclampsia when she was 42, approximately 2 years ago.  She spends time with Probation officer elaborating on the past 5 years, her marital issues with her first marriage, resulting in her having an affair, and eventually divorce, marrying her second husband. She reports that her son and daughter from her first marriage spent half of their time with her, and half of their time with dad and his new wife. She reports that the family tends to get along for the most part, but there has been some difficulty in navigating this new family structure. She reports that her and her second husband have a fair marriage, but there are issues that have come up especially since her stroke. She reports that she is not used to having somebody take care of her and is not used to feeling like she is a burden. She reports that she was always taught to be very resilient and self efficacious, from her mom. Mom is also living with them and helping her out as she is recovering.  She reports that she struggles with depressed mood, tearfulness, regret about her past mistakes. She reports that her sleep is poor, and she also has difficulty with poor appetite. She does not have  the energy she used to have. She used to be a diligent long-distance runner, and would go jogging every day. She reports that she is itching to get back to her daily exercise routine, and it's been quite difficult for her not to have this release. She denies any suicidality but sometimes wonders if it would be easier if she just disappeared.  Spent time with her reviewing her past medication trials, and she has always had a fairly good response with Prozac. Given the data and evidence for post hemorrhagic stroke neurological recovery, I'd like to keep her on Prozac at 60 mg daily. Given her sleep difficulties, we agreed to start Remeron as an augmenting agent. She does not have any discrete episodes of mania, but did have what appears to be somewhat of a disturbance of her conduct, or "midlife crisis" in the context of her marital issues during her first marriage. She reports that she was working and traveling, and decided to have an affair with an old flame from her younger years. She reports that she was also partying more as a way to "release" and reports that this change in her behavior went on for approximately a couple years until she had the stroke.  She's never had any hospitalizations for mental health, and never had any self-harm behaviors.  Associated Signs/Symptoms: Depression Symptoms:  depressed mood, insomnia, anxiety, loss of energy/fatigue, disturbed sleep, weight loss, (Hypo) Manic Symptoms:  Irritable Mood, Anxiety Symptoms:  none Psychotic Symptoms:  nonr PTSD Symptoms: Negative  Past Psychiatric History: Medication management with Dr. Arvil Persons  office until recently  Previous Psychotropic Medications: Yes   Substance Abuse History in the last 12 months:  No.  Consequences of Substance Abuse: Negative  Past Medical History:  Past Medical History:  Diagnosis Date  . Anxiety   . Depression   . ICH (intracerebral hemorrhage) ([place])   . Seizures ([place])   . Stroke [facility])      Past Surgical History:  Procedure Laterality Date  . DILATION AND CURETTAGE OF UTERUS      Family Psychiatric History: none  Family History:  Family History  Problem Relation Age of Onset  . Cancer Mother   . Cancer Father        pancreatic    Social History:   Social History   Social History  . Marital status: Married    Spouse name: N/A  . Number of children: N/A  . Years of education: N/A   Social History Main Topics  . Smoking status: Never Smoker  . Smokeless tobacco: Never Used  . Alcohol use 0.6 oz/week    1 Glasses of wine per week     Comment: casual   . Drug use: No  . Sexual activity: Yes    Birth control/ protection: None     Comment: husband has vasectomy   Other Topics Concern  . None   Social History Narrative  . 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 aspirin 81 MG chewable tablet Chew 81 mg by mouth daily.    . Cholecalciferol (VITAMIN D) 2000 units CAPS Take 2,000 Units by mouth daily.    Marland Kitchen FLUoxetine (PROZAC) 40 MG capsule Take 1 capsule (40 mg total) by mouth daily. Take with 20 mg for total of 60 mg daily 90 capsule 1  . Lacosamide 100 MG TABS Take 1.5 tablets twice a day 90 tablet 5  . levETIRAcetam (KEPPRA) 750 MG tablet Take 2 tablets (1,500 mg total) by mouth 2 (two) times daily. 120 tablet 5  . LORazepam (ATIVAN) 1 MG tablet Take 1 tablet as needed for seizure 10 tablet 3  . Multiple Vitamin (MULTIVITAMIN) tablet Take 1 tablet by mouth daily.      Marland Kitchen FLUoxetine (PROZAC) 20 MG capsule Take 1 capsule (20 mg total) by mouth daily. Take with 40 mg for a total of 60 mg daily 90 capsule 1  . mirtazapine (REMERON) 15 MG tablet Take 1 tablet (15 mg total) by mouth at bedtime. 90 tablet 1   No current facility-administered medications for this visit.    Facility-Administered Medications Ordered in Other Visits  Medication Dose Route Frequency Provider Last Rate Last Dose  . gadopentetate dimeglumine (MAGNEVIST) injection 12 mL  12 mL Intravenous Once PRN Garvin Fila, MD        Neurologic: Headache: Negative Seizure: Yes Paresthesias:Yes  Musculoskeletal: Strength & Muscle Tone: abnormal Gait & Station: normal, unsteady Patient leans: Right  Psychiatric Specialty Exam: Review of Systems  Constitutional: Negative.   HENT: Negative.   Respiratory: Negative.  Cardiovascular: Negative.   Gastrointestinal: Negative.   Neurological: Positive for sensory change, focal weakness and seizures.  Psychiatric/Behavioral: Positive for depression. Negative for hallucinations, substance abuse and suicidal ideas. The patient has insomnia. The patient is not nervous/anxious.     Blood pressure 122/70, pulse 87, height 5\' 3"  (1.6 m), weight 121 lb 12.8 oz (55.2 kg).Body mass index is 21.58 kg/m.  General Appearance: Casual and Well Groomed  Eye Contact:  Good  Speech:  Clear and Coherent  Volume:  Normal  Mood:  Dysphoric  Affect:  Appropriate and Congruent  Thought Process:  Coherent and Goal Directed  Orientation:  Full (Time, Place, and Person)  Thought Content:  Logical  Suicidal Thoughts:  No  Homicidal Thoughts:  No  Memory:  Immediate;   Good  Judgement:  Good  Insight:  Fair  Psychomotor Activity:  Normal  Concentration:  Concentration: Good  Recall:  Good  Fund of Knowledge:Good  Language: Good  Akathisia:  Negative  Handed:  Right  AIMS (if indicated):  0  Assets:  Communication Skills Desire for  Improvement Financial Resources/Insurance Housing Intimacy Leisure Time Resilience Social Support Talents/Skills Transportation Vocational/Educational  ADL's:  Intact  Cognition: WNL  Sleep:  5-6 hours    Treatment Plan Summary: Alyene Predmore is a 41 year old female with a history of major depressive disorder, complicated by hemorrhagic stroke 2 years ago in the setting of postpartum eclampsia, complicated by the development of seizures, and right sided spastic hemiparesis.  The past few years have been difficult in the setting of marital changes, further complicating some of her social life. She has had a significant insult to her physical abilities, and understandably significant difficulty in coping with her ongoing medical issues. She was previously able to work consistently, but is now on disability. She is hopeful that she may be able to return to work in the coming future, and continues to work with physical therapy to regain some of her physical abilities, with the hopes of being able to return to her routine of exercise and jogging. She presents with ongoing depressive symptoms, accentuated by difficulty sleeping and generally poor appetite. We will proceed as below and follow up in 6 weeks.  1. Recurrent major depressive disorder, in partial remission (HCC)    Continue Prozac 60 mg daily Initiate Remeron 15 mg nightly Seizure control per neurology Return to clinic in 6 weeks Ongoing individual therapy   Aundra Dubin, MD 8/20/201811:03 AM

## 2017-06-02 ENCOUNTER — Encounter (HOSPITAL_COMMUNITY): Payer: Self-pay | Admitting: Licensed Clinical Social Worker

## 2017-06-02 ENCOUNTER — Ambulatory Visit (INDEPENDENT_AMBULATORY_CARE_PROVIDER_SITE_OTHER): Payer: BLUE CROSS/BLUE SHIELD | Admitting: Licensed Clinical Social Worker

## 2017-06-02 DIAGNOSIS — F3341 Major depressive disorder, recurrent, in partial remission: Secondary | ICD-10-CM | POA: Diagnosis not present

## 2017-06-02 NOTE — Progress Notes (Signed)
   THERAPIST PROGRESS NOTE  Session Time: 10:10-11am  Participation Level: Active  Behavioral Response: CasualAlertAnxious  Type of Therapy: Individual Therapy  Treatment Goals addressed: Coping  Interventions: CBT  Summary: Ashley [name] is a 41 y.o. female who presents for her  individual counseling session. Pt discussed her psychiatric symptoms and current life events. Pt just returned from a family vacation. She enjoyed it, her children enjoyed it. Her husband drank too much but "put on a show" being nice to her in front of her family, Since returning home he has returned to being his old self.  Continued working with pt challenging her belief system using emotional regulation skills. Explained purpose process and practice of mindfulness technique. Encouraged pt to continue using these techniques at home.       Suicidal/Homicidal: Nowithout intent/plan  Therapist Response: Assessed pt's current functioning by self report.. Assisted pt processing belief system, emotional regulation skills and mindfulness practices.  Plan: Return again in 2 weeks.  Diagnosis: Axis I: Anxiety and depression        Rage Beever S, LCAS 06/02/2017

## 2017-06-29 ENCOUNTER — Ambulatory Visit (HOSPITAL_COMMUNITY): Payer: Self-pay | Admitting: Licensed Clinical Social Worker

## 2017-07-13 ENCOUNTER — Ambulatory Visit (INDEPENDENT_AMBULATORY_CARE_PROVIDER_SITE_OTHER): Payer: BLUE CROSS/BLUE SHIELD | Admitting: Licensed Clinical Social Worker

## 2017-07-13 ENCOUNTER — Encounter (HOSPITAL_COMMUNITY): Payer: Self-pay | Admitting: Licensed Clinical Social Worker

## 2017-07-13 DIAGNOSIS — F332 Major depressive disorder, recurrent severe without psychotic features: Secondary | ICD-10-CM | POA: Diagnosis not present

## 2017-07-13 NOTE — Progress Notes (Signed)
   THERAPIST PROGRESS NOTE  Session Time: 10:10-11am  Participation Level: Active  Behavioral Response: CasualAlertAnxious  Type of Therapy: Individual Therapy  Treatment Goals addressed: Coping  Interventions: CBT  Summary: Ashley [name] is a 41 y.o. female who presents for her  individual counseling session. Pt discussed her psychiatric symptoms and current life events. Pt reports her depressive symptoms seem to have stabilized and feels the meds must be working. Pt presented less anxious and depressed. Pt has been busy getting her children back in school. Her mother had knee replacement surgery and is in rehab. She says it's been nice not having her mother in the house. Pt is driving and feel more independent. Suggested to pt to write a gratitude list as it may assist pt with acceptance of her limitations. Discussed with pt the necessity to stop isolating and contact some of her friends again. Pt reports she had information about a stroke support group and suggested that she look into it. Or maybe start one for young people who have had strokes. Pt would benefit by a support group where she would be around like minded people with the same limitations. SHe would feel less isolated. Encouraged pt to continue using her mindfulenss techniques at home for her stressors.       Suicidal/Homicidal: Nowithout intent/plan  Therapist Response: Assessed pt's current functioning by self report.. Assisted pt processing independence, acceptance, support groups, isolation. and mindfulness practices.  Plan: Return again in 2 weeks.  Diagnosis: Axis I: Anxiety and depression        Linkin Vizzini S, LCAS 07/13/2017

## 2017-07-20 ENCOUNTER — Encounter (HOSPITAL_COMMUNITY): Payer: Self-pay | Admitting: Psychiatry

## 2017-07-20 ENCOUNTER — Ambulatory Visit (INDEPENDENT_AMBULATORY_CARE_PROVIDER_SITE_OTHER): Payer: BLUE CROSS/BLUE SHIELD | Admitting: Psychiatry

## 2017-07-20 VITALS — BP 118/68 | HR 77 | Ht 63.0 in | Wt 124.0 lb

## 2017-07-20 DIAGNOSIS — F99 Mental disorder, not otherwise specified: Principal | ICD-10-CM

## 2017-07-20 DIAGNOSIS — F3341 Major depressive disorder, recurrent, in partial remission: Secondary | ICD-10-CM

## 2017-07-20 DIAGNOSIS — F5105 Insomnia due to other mental disorder: Secondary | ICD-10-CM

## 2017-07-20 DIAGNOSIS — Z79899 Other long term (current) drug therapy: Secondary | ICD-10-CM | POA: Diagnosis not present

## 2017-07-20 DIAGNOSIS — F419 Anxiety disorder, unspecified: Secondary | ICD-10-CM

## 2017-07-20 DIAGNOSIS — Z8673 Personal history of transient ischemic attack (TIA), and cerebral infarction without residual deficits: Secondary | ICD-10-CM | POA: Diagnosis not present

## 2017-07-20 MED ORDER — MIRTAZAPINE 30 MG PO TABS: 30.0000 mg | ORAL_TABLET | Freq: Every day | ORAL | 1 refills | Status: DC

## 2017-07-20 MED ORDER — FLUOXETINE HCL 40 MG PO CAPS: 80.0000 mg | ORAL_CAPSULE | Freq: Every day | ORAL | 1 refills | Status: DC

## 2017-07-20 NOTE — Progress Notes (Signed)
[place] MD/PA/NP OP Progress Note  07/20/2017 9:14 AM Ashley [name]  MRN:  [id number]  Chief Complaint: med check, anxiety  HPI: Ashley [name] reports that her mood has been fairly good, but she realizes she was accidentally taking 120 mg of Prozac daily, taking three 40 mg capsules, thinking that these were 20 mg. She reports that she noticed and fixed the issue last week. She reports that she tolerated 120 mg without any side effects, or any GI intolerance or headaches or bruxism. She reports that she did feel more sleepy during the day.  She noticed that she was making this mistake when she got her refill.    We agreed to increase Prozac to 80 mg, as it appears that the higher dose was adding some benefit for her, and we agreed to increase Remeron to 30 mg nightly for added antidepressant benefits. She reports that she is trying to be more positive in her interpersonal interactions, and she continues in individual therapy.  She is trying to make a more concerted effort to journal on a weekly basis. I spent time with her discussing some of the progress she has made in terms of her physical therapy, and she will be resuming physical therapy this week, with the goal of being able to start taking her dog on walks. She denies any substance abuse or unsafe thoughts. No seizures recently. Agrees to return to clinic in 3 months or sooner if needed.  Visit Diagnosis:    ICD-10-CM   1. Insomnia due to other mental disorder F51.05    F99   2. Recurrent major depressive disorder, in partial remission (HCC) F33.41 FLUoxetine (PROZAC) 40 MG capsule    mirtazapine (REMERON) 30 MG tablet    DISCONTINUED: mirtazapine (REMERON) 30 MG tablet  3. Anxiety F41.9     Past Psychiatric History: See intake H&P for full details. Reviewed, with no updates at this time.   Past Medical History:  Past Medical History:  Diagnosis Date  . Anxiety   . Depression   . ICH (intracerebral hemorrhage) ([place])   . Seizures ([place])    . Stroke [facility])     Past Surgical History:  Procedure Laterality Date  . DILATION AND CURETTAGE OF UTERUS      Family Psychiatric History: See intake H&P for full details. Reviewed, with no updates at this time.   Family History:  Family History  Problem Relation Age of Onset  . Cancer Mother   . Cancer Father        pancreatic    Social History:  Social History   Social History  . Marital status: Married    Spouse name: N/A  . Number of children: N/A  . Years of education: N/A   Social History Main Topics  . Smoking status: Never Smoker  . Smokeless tobacco: Never Used  . Alcohol use 0.6 oz/week    1 Glasses of wine per week     Comment: casual   . Drug use: No  . Sexual activity: Yes    Birth control/ protection: None     Comment: husband has vasectomy   Other Topics Concern  . None   Social History Narrative  . None    Allergies: No Known Allergies  Metabolic Disorder Labs: Lab Results  Component Value Date   HGBA1C 5.1 02/07/2016   No results found for: PROLACTIN Lab Results  Component Value Date   CHOL 194 02/07/2016   TRIG 45 02/07/2016   HDL 90 02/07/2016  CHOLHDL 2.2 02/07/2016   [place] 95 02/07/2016   No results found for: TSH  Therapeutic Level Labs: No results found for: LITHIUM Lab Results  Component Value Date   VALPROATE <10 (L) 06/01/2015   No components found for:  CBMZ  Current Medications: Current Outpatient Prescriptions  Medication Sig Dispense Refill  . aspirin 81 MG chewable tablet Chew 81 mg by mouth daily.    . Cholecalciferol (VITAMIN D) 2000 units CAPS Take 2,000 Units by mouth daily.    Marland Kitchen FLUoxetine (PROZAC) 40 MG capsule Take 2 capsules (80 mg total) by mouth daily. 180 capsule 1  . Lacosamide 100 MG TABS Take 1.5 tablets twice a day 90 tablet 5  . levETIRAcetam (KEPPRA) 750 MG tablet Take 2 tablets (1,500 mg total) by mouth 2 (two) times daily. 120 tablet 5  . LORazepam (ATIVAN) 1 MG tablet Take 1 tablet  as needed for seizure 10 tablet 3  . mirtazapine (REMERON) 30 MG tablet Take 1 tablet (30 mg total) by mouth at bedtime. 90 tablet 1  . Multiple Vitamin (MULTIVITAMIN) tablet Take 1 tablet by mouth daily.      No current facility-administered medications for this visit.    Facility-Administered Medications Ordered in Other Visits  Medication Dose Route Frequency Provider Last Rate Last Dose  . gadopentetate dimeglumine (MAGNEVIST) injection 12 mL  12 mL Intravenous Once PRN Garvin Fila, MD         Musculoskeletal: Strength & Muscle Tone: abnormal Gait & Station: broad based Patient leans: Left  Psychiatric Specialty Exam: Review of Systems  Constitutional: Negative.   HENT: Negative.   Respiratory: Negative.   Cardiovascular: Negative.   Gastrointestinal: Negative.   Musculoskeletal: Negative.   Neurological: Positive for focal weakness. Negative for tingling, seizures, loss of consciousness and headaches.  Psychiatric/Behavioral: The patient is nervous/anxious.     Blood pressure 118/68, pulse 77, height 5\' 3"  (1.6 m), weight 124 lb (56.2 kg).Body mass index is 21.97 kg/m.  General Appearance: Casual and Well Groomed  Eye Contact:  Good  Speech:  Clear and Coherent  Volume:  Normal  Mood:  Dysphoric  Affect:  Appropriate and Congruent  Thought Process:  Goal Directed  Orientation:  Full (Time, Place, and Person)  Thought Content: Logical   Suicidal Thoughts:  No  Homicidal Thoughts:  No  Memory:  Immediate;   Good  Judgement:  Good  Insight:  Good  Psychomotor Activity:  Normal  Concentration:  Concentration: Good  Recall:  Good  Fund of Knowledge: Good  Language: Good  Akathisia:  Negative  Handed:  Right  AIMS (if indicated): not done  Assets:  Communication Skills Desire for Improvement Financial Resources/Insurance Housing Intimacy Leisure Time Resilience Social Support Talents/Skills Transportation Vocational/Educational  ADL's:  Intact   Cognition: WNL  Sleep:  Good   Screenings: PHQ2-9     [facility]  PHQ-2 Total Score  6  2  PHQ-9 Total Score  -  8       Assessment and Plan:  Ashley [name] is a 41 year old female with a history of major depressive disorder complicated by a recent hemorrhagic stroke. She has continued to contend with the medical sequelae over the past 2 years, and has also been adjusting to multiple social and marital changes. She has had an improvement in her symptoms with Prozac, but continues to struggle with periodic rumination and interpersonal anxiety. We  agreed to increase Prozac and Remeron as below.   Notably, she had been taking Prozac 120 mg daily for at least 3 weeks without any side effects, except for some mild sedation. I wonder if she has a rapid metabolizer of SSRI, and if she has a plateau in her improvement, we may pursue gene testing.  1. Insomnia due to other mental disorder   2. Recurrent major depressive disorder, in partial remission ([place])   3. Anxiety    Increase Prozac to 80 mg daily Increase Remeron to 30 mg nightly Return to clinic in 3 months  I spent 25 minutes with the patient in direct care and counseling. We discussed pharmacologic interventions, risks and benefits, and ongoing behavioral strategies.      Aundra Dubin, MD 07/20/2017, 9:14 AM

## 2017-07-27 ENCOUNTER — Encounter (HOSPITAL_COMMUNITY): Payer: Self-pay | Admitting: Licensed Clinical Social Worker

## 2017-07-27 ENCOUNTER — Ambulatory Visit (INDEPENDENT_AMBULATORY_CARE_PROVIDER_SITE_OTHER): Payer: BLUE CROSS/BLUE SHIELD | Admitting: Licensed Clinical Social Worker

## 2017-07-27 DIAGNOSIS — F332 Major depressive disorder, recurrent severe without psychotic features: Secondary | ICD-10-CM

## 2017-07-27 NOTE — Progress Notes (Signed)
   THERAPIST PROGRESS NOTE  Session Time: 10:10-11am  Participation Level: Active  Behavioral Response: CasualAlertAnxious  Type of Therapy: Individual Therapy  Treatment Goals addressed: Coping  Interventions: CBT  Summary: Ashley [name] is a 41 y.o. female who presents for her  individual counseling session. Pt discussed her psychiatric symptoms and current life events. Pt reports her depressive symptoms seem to have stabilized and feels the meds must be working. Pt saw Dr. Sharyon Medicus and  He changed some of her medications. She reports she is sleeping much better. [name] has moved back in with her and her family after surgery. Her presence can be problematic in their household. Pt seemed more anxious today. Pt talked about some OCD behavior she is concerned about. She has a box where she stores her daily information. She checks the box everyday to see what she has planned for the day. Her husband and mother make fun of her about her box. She is concerned about this behavior and wonders if it is problematic. Asked open ended questions about her box and assured her it didn't seem to be a matter of concern at this point. Suggested to talk with her psychiatrist about it. Pt continues to ruminate about her marriage and her choice to leave her 1st husband and marry the man she had an affair with. Asked pt open ended questions. Talked with pt about her shame she feels about her decisions. Pt was open in her tearful discussion about her choices and shame.     Suicidal/Homicidal: Nowithout intent/plan  Therapist Response: Assessed pt's current functioning by self report.. Assisted pt processing ocd behavior, shame, choices and acceptance.  Plan: Return again in 2 weeks.  Diagnosis: Axis I: Anxiety and depression        Aila Terra S, LCAS 07/27/2017

## 2017-07-28 ENCOUNTER — Encounter: Payer: Self-pay | Admitting: Neurology

## 2017-07-28 ENCOUNTER — Ambulatory Visit (INDEPENDENT_AMBULATORY_CARE_PROVIDER_SITE_OTHER): Payer: BLUE CROSS/BLUE SHIELD | Admitting: Neurology

## 2017-07-28 VITALS — BP 92/64 | HR 69 | Ht 63.0 in | Wt 127.0 lb

## 2017-07-28 DIAGNOSIS — G40109 Localization-related (focal) (partial) symptomatic epilepsy and epileptic syndromes with simple partial seizures, not intractable, without status epilepticus: Secondary | ICD-10-CM

## 2017-07-28 MED ORDER — LEVETIRACETAM 750 MG PO TABS: 1500.0000 mg | ORAL_TABLET | Freq: Two times a day (BID) | ORAL | 11 refills | Status: DC

## 2017-07-28 MED ORDER — LACOSAMIDE 100 MG PO TABS: ORAL_TABLET | ORAL | 5 refills | Status: DC

## 2017-07-28 NOTE — Patient Instructions (Signed)
1. Increase Vimpat 100mg : Take 2 tablets twice a day. Keep a calendar of the episodes affecting hearing, and see if they lessen with the higher dose. Update our office in 3 months 2. Continue Keppra 1500mg  twice a day 3. Follow-up in 5-6 months, call for any changes  Seizure Precautions: 1. If medication has been prescribed for you to prevent seizures, take it exactly as directed.  Do not stop taking the medicine without talking to your doctor first, even if you have not had a seizure in a long time.   2. Avoid activities in which a seizure would cause danger to yourself or to others.  Don't operate dangerous machinery, swim alone, or climb in high or dangerous places, such as on ladders, roofs, or girders.  Do not drive unless your doctor says you may.  3. If you have any warning that you may have a seizure, lay down in a safe place where you can't hurt yourself.    4.  No driving for 6 months from last seizure, as per [facility].   Please refer to the following link on the [place] website for more information: http://[email or web address]/[email or web address]/driving/drivingu.cfm   5.  Maintain good sleep hygiene. Avoid alcohol.  6.  Notify your neurology if you are planning pregnancy or if you become pregnant.  7.  Contact your doctor if you have any problems that may be related to the medicine you are taking.  8.  Call 911 and bring the patient back to the ED if:        A.  The seizure lasts longer than 5 minutes.       B.  The patient doesn't awaken shortly after the seizure  C.  The patient has new problems such as difficulty seeing, speaking or moving  D.  The patient was injured during the seizure  E.  The patient has a temperature over 102 F (39C)  F.  The patient vomited and now is having trouble breathing

## 2017-07-28 NOTE — Progress Notes (Signed)
NEUROLOGY FOLLOW UP OFFICE NOTE  Kiaraliz Rafuse [id number] 42/25/1977  HISTORY OF PRESENT ILLNESS: I had the pleasure of seeing Rasheeda Mulvehill in follow-up in the neurology clinic on 07/28/2017. The patient was last seen 4 months ago for seizures. 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 She is back to baseline now. She denies any bigger seizures, no aphasic episodes. She continues to report brief episodes where she suddenly could not hear for 5 minutes. She would tell family "don't talk because I can't hear you." She may be a little confused, no associated focal weakness, twitching or jerking, no headache, or dizziness. No staring/unresponsive episodes, gaps in time. On her last visit, she reported a few of these episodes and increased Vimpat to 150mg  BID. She is also taking Keppra 1500mg  BID with no side effects. She was reporting worsening of OCD and continues to see her psychiatrist. Prozac was increased, sleep is better on mirtazapine. She continues to work with her therapist.   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 She had focal seizures in 2016 with EEG showing left PLEDs and has been on Keppra 1500mg  BID since then with no further focal motor seizures, however she would have occasional episodes around once a month where she could not get her words out, lasting 3-4 minutes. Sometimes she would get hot and have to sit but her speech would not be affected. On 11/27/16, she started having the same speech difficulties where she was making paraphasic errors, but it continued for 30 minutes and they decided to go to the ER. In the ER, she was noted to be aphasic, then as she was having a head CT, she suddenly had right gaze and head deviation, right arm and leg flexion and shaking for  around 3 minutes. She was given IV Ativan but her right leg continued to shake in a rhythmic fashion. She was given additional Keppra and started on Vimpat with no further seizures, back to baseline in a couple of hours. She initially had some difficulties with the Vimpat 100mg  BID where she had hallucinations of hearing music or voices, but these have resolved over the past week. She denies any side effects on Keppra 1500mg  BID. She denies any dizziness, diplopia, gait instability. She has a right foot drop, denies any numbness/tingling on the right side but states it feels different that the other side. She denies any olfactory/gustatory hallucinations, myoclonic jerks, episodes of staring/unresponsiveness, gaps in time. She has not had any further episodes of paraphasic errors since her hospitalization. She had a mild diffuse headache for a couple of days but this has resolved. She denies any dizziness, dysarthria/dysphagia, neck/back pain, bowel/bladder dysfunction.  Records from her hospitalization in August 2016 were reviewed. She has been seeing neurologist Dr. Leonie Man for post-stroke and seizure care since then, last visit was in August 2017. She had a severe headache for several days in August 2016 2 weeks post-partum. She then developed right-sided weakness, aphasia, then a focal seizure in the hospital. She was found to have a large left parietal ICH with IVH, cerebral edema, obstructive hydrocephalus and subarachnoid hemorrhage. MRA and MRV were unremarkable. Concern was for cerebral cortical vein thrombosis not seen on MRV, however RCVS was also a consideration. She was treated with hypertonic saline and stabilized. She had a  seizure with EEG showing left PLEDs. She was started on Keppra. Hypercoagulable labs were normal. She was discharged to rehab and had another seizure on 06/01/16 with report of eyes rolling back and upper extremities shaking. Keppra dose was increased. She had a repeat MRI brain in  September 2016 with resolving subacute left parietal hematoma. There was acute gyral swelling and T2 hyperintensity in the left hippocampal formation, left insula, and operculum, that resolved with repeat MRI 09/2015 showing resolution of these changes. She was initially having some headaches and low dose Topamax was briefly added, then with resolution of headaches, this was tapered off. She had been dealing with post-stroke depression and symptoms had improved, however with limitations on driving again, she is starting to feel depressed again. She has three young children, the youngest is 31 months old. She was not happy with neuro rehab and has a private physical therapist coming weekly where she pushes herself and has made a ton of improvement. She does not like to wear her AFO and her husband did not notice any difference in gait with the AFO.   Epilepsy Risk Factors:  History of large left parietal hematoma with encephalomalacia. Otherwise she had a normal birth and early development.  There is no history of febrile convulsions, CNS infections such as meningitis/encephalitis, significant traumatic brain injury, neurosurgical procedures, or family history of seizures.  Diagnostic Data: I personally reviewed MRI brain 11/26/2016 which did not show any acute changes. There was left paramedian parietal encephalomalacia with extensive hemosiderin staining extending into the splenium of the corpus callosum, ex vacuo dilatation of the left lateral ventricle occipital horn, increased side of the temporal horn of the left lateral ventricle which may be due to decreased volume of the hippocampus. It is smaller in size with increased signal compared to the right. EEG 11/27/16 showed mild left hemispheric slowing EEG 05/22/15: left hemisphere slowing, rare epileptiform discharges over the left anterior temporal region EEG 05/21/15: PLEDs on the left hemisphere, mainly involving the temporo-parietal region  Prior  AEDs: Topamax PAST MEDICAL HISTORY: Past Medical History:  Diagnosis Date  . Anxiety   . Depression   . ICH (intracerebral hemorrhage) ([place])   . Seizures ([place])   . Stroke [facility])     MEDICATIONS: Current Outpatient Prescriptions on File Prior to Visit  Medication Sig Dispense Refill  . aspirin 81 MG chewable tablet Chew 81 mg by mouth daily.    . Cholecalciferol (VITAMIN D) 2000 units CAPS Take 2,000 Units by mouth daily.    Marland Kitchen FLUoxetine (PROZAC) 40 MG capsule Take 2 capsules (80 mg total) by mouth daily. 180 capsule 1  . Lacosamide 100 MG TABS Take 1.5 tablets twice a day 90 tablet 5  . levETIRAcetam (KEPPRA) 750 MG tablet Take 2 tablets (1,500 mg total) by mouth 2 (two) times daily. 120 tablet 5  . LORazepam (ATIVAN) 1 MG tablet Take 1 tablet as needed for seizure 10 tablet 3  . mirtazapine (REMERON) 30 MG tablet Take 1 tablet (30 mg total) by mouth at bedtime. 90 tablet 1  . Multiple Vitamin (MULTIVITAMIN) tablet Take 1 tablet by mouth daily.      Current Facility-Administered Medications on File Prior to Visit  Medication Dose Route Frequency Provider Last Rate Last Dose  . gadopentetate dimeglumine (MAGNEVIST) injection 12 mL  12 mL Intravenous Once PRN Garvin Fila, MD        ALLERGIES: No Known Allergies  FAMILY HISTORY: Family History  Problem Relation Age of Onset  .  Cancer Mother   . Cancer Father        pancreatic    SOCIAL HISTORY: Social History   Social History  . Marital status: Married    Spouse name: N/A  . Number of children: N/A  . Years of education: N/A   Occupational History  . Not on file.   Social History Main Topics  . Smoking status: Never Smoker  . Smokeless tobacco: Never Used  . Alcohol use 0.6 oz/week    1 Glasses of wine per week     Comment: casual   . Drug use: No  . Sexual activity: Yes    Birth control/ protection: None     Comment: husband has vasectomy   Other Topics Concern  . Not on file   Social History  Narrative  . No narrative on file    REVIEW OF SYSTEMS: Constitutional: No fevers, chills, or sweats, no generalized fatigue, change in appetite Eyes: No visual changes, double vision, eye pain Ear, nose and throat: No hearing loss, ear pain, nasal congestion, sore throat Cardiovascular: No chest pain, palpitations Respiratory:  No shortness of breath at rest or with exertion, wheezes GastrointestinaI: No nausea, vomiting, diarrhea, abdominal pain, fecal incontinence Genitourinary:  No dysuria, urinary retention or frequency Musculoskeletal:  No neck pain, back pain Integumentary: No rash, pruritus, skin lesions Neurological: as above Psychiatric: No depression, insomnia, anxiety Endocrine: No palpitations, fatigue, diaphoresis, mood swings, change in appetite, change in weight, increased thirst Hematologic/Lymphatic:  No anemia, purpura, petechiae. Allergic/Immunologic: no itchy/runny eyes, nasal congestion, recent allergic reactions, rashes  PHYSICAL EXAM: Vitals:   07/28/17 1602  BP: 92/64  Pulse: 69  SpO2: 98%   General: No acute distress Head:  Normocephalic/atraumatic Neck: supple, no paraspinal tenderness, full range of motion Heart:  Regular rate and rhythm Lungs:  Clear to auscultation bilaterally Back: No paraspinal tenderness Skin/Extremities: No rash, no edema Neurological Exam: alert and oriented to person, place, and time. No aphasia or dysarthria. Fund of knowledge is appropriate.  Recent and remote memory are intact.  Attention and concentration are normal.    Able to name objects and repeat phrases. are normal.    Able to name objects and repeat phrases. Cranial nerves: CN I: not tested CN II: pupils equal, round and reactive to light, visual fields intact, fundi unremarkable. CN III, IV, VI:  full range of motion, no nystagmus, mild bilateral ptosis at baseline, not fatigable with sustained upgaze CN V: decreased on right V1-3 (unchanged) CN VII: upper and  lower face symmetric CN VIII: hearing intact to finger rub CN IX, X: gag intact, uvula midline CN XI: sternocleidomastoid and trapezius muscles intact CN XII: tongue midline Bulk & Tone: increased tone on right LE (more on right ankle), no fasciculations. Motor: 0/5 right foot dorsiflexion (wearing AFO), eversion/inversion, 2/5 plantarflexion. Otherwise 5/5 throughout with no pronator drift.  Sensation: decreased on right UE and LE (unchanged) Deep Tendon Reflexes: brisk +3 on the right UE and LE, no clonus, +2 on left UE and LE Plantar responses: downgoing bilaterally Cerebellar: no incoordination on finger to nose testing Gait: spastic hemiparetic gait with foot circumduction (unchanged) Tremor: none  IMPRESSION: This is a pleasant 41 yo RH woman with a history of large left ICH and focal seizures in August 2016 with residual right foot drop, with no focal motor seizures on Keppra 1500mg  BID until hospital admission last 11/27/15. She was having brief episodes of paraphasic errors around every month, then on 11/27/15 had a  prolonged episode where she was making paraphasic errors, followed by right-sided focal seizure. Vimpat was added, and dose was increased on last visit for the transient episodes where she could not hear what people are saying. We will try increasing dose a little more to 200mg  BID to see if there is any change in frequency of spells. We discussed how simple partial seizures are more difficult to completely quiet down, sometimes needing to find a balance between seizures and side effects as we increase dose of medications. Continue Keppra 1500mg  BID. She will keep a calendar of episodes and follow-up in 5-6 months. She does not drive and is aware of [place] driving laws to stop driving until 6 months seizure-free. She knows to call for any changes.   Thank you for allowing me to participate in her care.  Please do not hesitate to call for any questions or concerns.  The duration of  this appointment visit was 25 minutes of face-to-face time with the patient.  Greater than 50% of this time was spent in counseling, explanation of diagnosis, planning of further management, and coordination of care.   Ellouise Newer, M.D.   CC: Dr. Brigitte Pulse

## 2017-08-04 ENCOUNTER — Ambulatory Visit: Payer: Self-pay | Admitting: Physical Medicine & Rehabilitation

## 2017-08-10 ENCOUNTER — Encounter (HOSPITAL_COMMUNITY): Payer: Self-pay | Admitting: Licensed Clinical Social Worker

## 2017-08-10 ENCOUNTER — Ambulatory Visit (INDEPENDENT_AMBULATORY_CARE_PROVIDER_SITE_OTHER): Payer: BLUE CROSS/BLUE SHIELD | Admitting: Licensed Clinical Social Worker

## 2017-08-10 DIAGNOSIS — F419 Anxiety disorder, unspecified: Secondary | ICD-10-CM

## 2017-08-10 DIAGNOSIS — F329 Major depressive disorder, single episode, unspecified: Secondary | ICD-10-CM | POA: Diagnosis not present

## 2017-08-10 DIAGNOSIS — F332 Major depressive disorder, recurrent severe without psychotic features: Secondary | ICD-10-CM

## 2017-08-10 NOTE — Progress Notes (Signed)
   THERAPIST PROGRESS NOTE  Session Time: 9:10-10am  Participation Level: Active  Behavioral Response: CasualAlertAnxious  Type of Therapy: Individual Therapy  Treatment Goals addressed: Coping  Interventions: CBT  Summary: Ashley [name] is a 41 y.o. female who presents for her  individual counseling session. Pt discussed her psychiatric symptoms and current life events. Pt presented extremely anxious. She has decided to leave her husband. Validated pt on her decision. Pt wanted to discuss how she came to her decision. Gave pt immeditate feedback on her decision and her plans moving forward. Assisted pt with empathatic reflection, confirming her plan as thoughtful. Pt has sought legal advice and has appt this week. Pt has begun to reach out to previous girlfriends, having lunch and meeting for coffee. Validated pt on her continued self care.     Suicidal/Homicidal: Nowithout intent/plan  Therapist Response: Assessed pt's current functioning by self report and reviewed progress. Assisted pt processing her maritial decision, empathatic reflection, self care.  Plan: Return again in 2 weeks.  Diagnosis: Axis I: Anxiety and depression        Jaide Hillenburg S, LCAS 08/10/2017

## 2017-08-17 ENCOUNTER — Encounter (HOSPITAL_COMMUNITY): Payer: Self-pay | Admitting: Licensed Clinical Social Worker

## 2017-08-17 ENCOUNTER — Ambulatory Visit (INDEPENDENT_AMBULATORY_CARE_PROVIDER_SITE_OTHER): Payer: BLUE CROSS/BLUE SHIELD | Admitting: Licensed Clinical Social Worker

## 2017-08-17 DIAGNOSIS — F419 Anxiety disorder, unspecified: Secondary | ICD-10-CM

## 2017-08-17 DIAGNOSIS — F332 Major depressive disorder, recurrent severe without psychotic features: Secondary | ICD-10-CM

## 2017-08-17 NOTE — Progress Notes (Signed)
THERAPIST PROGRESS NOTE  Session Time: 9:10-10am  Participation Level: Active  Behavioral Response: CasualAlertAnxious  Type of Therapy: Individual Therapy  Treatment Goals addressed: Coping  Interventions: CBT  Summary: Ashley [name] is a 41 y.o. female who presents for her  individual counseling session. Pt discussed her psychiatric symptoms and current life events. Pt presented extremely anxious. Pt has met with an attorney and is moving forward with leaving her husband. Pt was tearful in her discussion about her impending divorce. Gave pt immediate feedback and validation about her choices. Pt is worried about her ability to support herself and her children. Assisted pt using empathic reflection about her concerns. Pt reports she is making her self care a priority due to her new stressors.     Suicidal/Homicidal: Nowithout intent/plan  Therapist Response: Assessed pt's current functioning by self report and reviewed progress. Assisted pt processing her maritial decision, empathatic reflection, self care.  Plan: Return again in 2 weeks.  Diagnosis: Axis I: Anxiety and depression        Timoteo Carreiro S, LCAS 08/17/2017

## 2017-08-20 ENCOUNTER — Encounter: Payer: Self-pay | Admitting: Physical Medicine & Rehabilitation

## 2017-08-20 ENCOUNTER — Ambulatory Visit: Payer: BLUE CROSS/BLUE SHIELD | Admitting: Physical Medicine & Rehabilitation

## 2017-08-20 ENCOUNTER — Encounter: Payer: BLUE CROSS/BLUE SHIELD | Attending: Physical Medicine & Rehabilitation

## 2017-08-20 VITALS — BP 95/63 | HR 90

## 2017-08-20 DIAGNOSIS — I69151 Hemiplegia and hemiparesis following nontraumatic intracerebral hemorrhage affecting right dominant side: Secondary | ICD-10-CM | POA: Diagnosis not present

## 2017-08-20 DIAGNOSIS — I69320 Aphasia following cerebral infarction: Secondary | ICD-10-CM

## 2017-08-20 DIAGNOSIS — G8110 Spastic hemiplegia affecting unspecified side: Secondary | ICD-10-CM | POA: Insufficient documentation

## 2017-08-20 NOTE — Progress Notes (Signed)
Subjective:    Patient ID: Ashley [name], female    DOB: 1975/12/18, 41 y.o.   MRN: [id number]  HPI  Chief complaint is poor memory.  41 year old female with dural sinus thrombosis as well as left parietal hemorrhagic infarct postpartum approximately 2 years ago.  Patient completed inpatient rehab and outpatient rehabilitation.  She is now independent with all her self-care and mobility.  She does use a right AFO.  She can use an elliptical for exercise. She does require some assistance raising her toddler. Patient has a history of post stroke seizure disorder, had another episode last month, has seen neurology 07/28/2017 diagnosis of simple partial seizures.  On Keppra and Vimpat.  She cannot drive because of her seizure disorder. 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 General activity 0 Relation with others 0 Enjoyment of life 0 What TIME of day is your pain at its worst? no pain Sleep (in general) Good  Pain is worse with: no pain Pain improves with: no pain Relief from Meds: 0  Mobility walk with assistance ability to climb steps?  yes do you drive?  yes transfers alone  Function disabled: date disabled .  Neuro/Psych depression anxiety  Prior Studies Any changes since last visit?  no  Physicians involved in your care Any changes since last visit?  no   Family History  Problem Relation Age of Onset  . Cancer Mother   . Cancer Father        pancreatic   Social History   Socioeconomic History  . Marital status: Married    Spouse name: Not on file  . Number of children: Not on file  . Years of education: Not on file  . Highest education level: Not on file  Social  Needs  . Financial resource strain: Not on file  . Food insecurity - worry: Not on file  . Food insecurity - inability: Not on file  . Transportation needs - medical: Not on file  . Transportation needs - non-medical: Not on file  Occupational History  . Not on file  Tobacco Use  . Smoking status: Never Smoker  . Smokeless tobacco: Never Used  Substance and Sexual Activity  . Alcohol use: Yes    Alcohol/week: 0.6 oz    Types: 1 Glasses of wine per week    Comment: casual   . Drug use: No  . Sexual activity: Yes    Birth control/protection: None    Comment: husband has vasectomy  Other Topics Concern  . Not on file  Social History Narrative  . Not on file   Past Surgical History:  Procedure Laterality Date  . DILATION AND CURETTAGE OF UTERUS     Past Medical History:  Diagnosis Date  . Anxiety   . Depression   . ICH (intracerebral hemorrhage) ([place])   . Seizures ([place])   . Stroke [facility])    There were no vitals taken for this visit.  Opioid Risk Score:   Fall Risk Score:  `1  Depression screen PHQ 2/9  Depression screen [facility] 2/9 08/20/2017 05/04/2017  Decreased Interest 1 3  Down, Depressed, Hopeless 1 3  PHQ - 2 Score  2 6  Some recent data might be hidden      Review of Systems  Constitutional: Negative.   HENT: Negative.   Eyes: Negative.   Respiratory: Negative.   Cardiovascular: Negative.   Gastrointestinal: Negative.   Endocrine: Negative.   Genitourinary: Negative.   Musculoskeletal: Negative.   Skin: Negative.   Allergic/Immunologic: Negative.   Neurological: Negative.   Hematological: Negative.   Psychiatric/Behavioral: Negative.        Objective:   Physical Exam  Constitutional: She is oriented to person, place, and time. She appears well-developed and well-nourished. No distress.  HENT:  Head: Normocephalic and atraumatic.  Eyes: Conjunctivae and EOM are normal. Pupils are equal, round, and reactive to light.  Musculoskeletal: Normal  range of motion.  Neurological: She is alert and oriented to person, place, and time.  Skin: She is not diaphoretic.  Psychiatric: She has a normal mood and affect.  Nursing note and vitals reviewed.  Speech overall is fluent although the patient does have occasional word finding deficits.  She is able to follow commands. Her sustained attention is good. She exhibits no evidence of apraxia. Motor strength is 4 at the right hip flexor knee extensor 3- ankle dorsiflexor and plantar flexor on the right side 5 left hip flexor and extensor ankle dorsiflexor and plantar flexor Right lower extremity tone: Modified Ashworth score 3 ankle plantar flexor as well as the inverters.  3 toe flexors.      Assessment & Plan:  1.  Left parietal hemorrhage postpartum with chronic right lower extremity spastic monoparesis.  Her upper body strength has recovered quite well right side. She has residual aphasia.  I think this is a bigger issue as opposed to cognitive deficits overall.  I do think she would benefit from neuropsychology evaluation given her desire to get back into the workforce. As we discussed she is capable of doing some work although I am not sure she can get back to her previous occupation Also she is able to drive at the current time

## 2017-08-21 ENCOUNTER — Encounter (HOSPITAL_COMMUNITY): Payer: Self-pay | Admitting: Nurse Practitioner

## 2017-08-21 ENCOUNTER — Emergency Department (HOSPITAL_COMMUNITY)
Admission: EM | Admit: 2017-08-21 | Discharge: 2017-08-21 | Disposition: A | Discharge status: Home / Self Care | Payer: BLUE CROSS/BLUE SHIELD | Attending: Emergency Medicine | Admitting: Emergency Medicine

## 2017-08-21 ENCOUNTER — Emergency Department (HOSPITAL_COMMUNITY): Payer: BLUE CROSS/BLUE SHIELD

## 2017-08-21 DIAGNOSIS — R200 Anesthesia of skin: Secondary | ICD-10-CM | POA: Insufficient documentation

## 2017-08-21 DIAGNOSIS — Z79899 Other long term (current) drug therapy: Secondary | ICD-10-CM | POA: Insufficient documentation

## 2017-08-21 DIAGNOSIS — R531 Weakness: Secondary | ICD-10-CM | POA: Diagnosis not present

## 2017-08-21 DIAGNOSIS — R569 Unspecified convulsions: Secondary | ICD-10-CM | POA: Diagnosis not present

## 2017-08-21 DIAGNOSIS — R42 Dizziness and giddiness: Secondary | ICD-10-CM | POA: Diagnosis not present

## 2017-08-21 DIAGNOSIS — Z8673 Personal history of transient ischemic attack (TIA), and cerebral infarction without residual deficits: Secondary | ICD-10-CM | POA: Insufficient documentation

## 2017-08-21 DIAGNOSIS — Z7982 Long term (current) use of aspirin: Secondary | ICD-10-CM | POA: Insufficient documentation

## 2017-08-21 DIAGNOSIS — R4182 Altered mental status, unspecified: Secondary | ICD-10-CM | POA: Diagnosis not present

## 2017-08-21 DIAGNOSIS — R4702 Dysphasia: Secondary | ICD-10-CM | POA: Diagnosis present

## 2017-08-21 DIAGNOSIS — I639 Cerebral infarction, unspecified: Secondary | ICD-10-CM | POA: Diagnosis not present

## 2017-08-21 LAB — COMPREHENSIVE METABOLIC PANEL
ALK PHOS: 55 U/L (ref 38–126)
ALT: 18 U/L (ref 14–54)
AST: 28 U/L (ref 15–41)
Albumin: 4.6 g/dL (ref 3.5–5.0)
Anion gap: 8 (ref 5–15)
BUN: 13 mg/dL (ref 6–20)
CALCIUM: 9.3 mg/dL (ref 8.9–10.3)
CO2: 27 mmol/L (ref 22–32)
CREATININE: 0.7 mg/dL (ref 0.44–1.00)
Chloride: 101 mmol/L (ref 101–111)
GFR calc non Af Amer: >60 mL/min (ref >60)
Glucose, Bld: 100 mg/dL — ABNORMAL HIGH (ref 65–99)
Potassium: 3.6 mmol/L (ref 3.5–5.1)
SODIUM: 136 mmol/L (ref 135–145)
Total Bilirubin: 0.6 mg/dL (ref 0.3–1.2)
Total Protein: 7.2 g/dL (ref 6.5–8.1)

## 2017-08-21 LAB — I-STAT CHEM 8, ED
BUN: 16 mg/dL (ref 6–20)
CHLORIDE: 97 mmol/L — AB (ref 101–111)
Calcium, Ion: 1.19 mmol/L (ref 1.15–1.40)
Creatinine, Ser: 0.7 mg/dL (ref 0.44–1.00)
GLUCOSE: 102 mg/dL — AB (ref 65–99)
HCT: 40 % (ref 36.0–46.0)
HEMOGLOBIN: 13.6 g/dL (ref 12.0–15.0)
POTASSIUM: 3.7 mmol/L (ref 3.5–5.1)
Sodium: 137 mmol/L (ref 135–145)
TCO2: 30 mmol/L (ref 22–32)

## 2017-08-21 LAB — CBC
HEMATOCRIT: 37.1 % (ref 36.0–46.0)
Hemoglobin: 12.4 g/dL (ref 12.0–15.0)
MCH: 31.8 pg (ref 26.0–34.0)
MCHC: 33.4 g/dL (ref 30.0–36.0)
MCV: 95.1 fL (ref 78.0–100.0)
PLATELETS: 221 10*3/uL (ref 150–400)
RBC: 3.9 MIL/uL (ref 3.87–5.11)
RDW: 12.9 % (ref 11.5–15.5)
WBC: 7 10*3/uL (ref 4.0–10.5)

## 2017-08-21 LAB — DIFFERENTIAL
Basophils Absolute: 0 10*3/uL (ref 0.0–0.1)
Basophils Relative: 0 %
Eosinophils Absolute: 0.1 10*3/uL (ref 0.0–0.7)
Eosinophils Relative: 1 %
LYMPHS PCT: 26 %
Lymphs Abs: 1.8 10*3/uL (ref 0.7–4.0)
MONO ABS: 0.4 10*3/uL (ref 0.1–1.0)
MONOS PCT: 6 %
NEUTROS ABS: 4.7 10*3/uL (ref 1.7–7.7)
Neutrophils Relative %: 67 %

## 2017-08-21 LAB — APTT: aPTT: 28 seconds (ref 24–36)

## 2017-08-21 LAB — I-STAT TROPONIN, ED: Troponin i, poc: 0 ng/mL (ref 0.00–0.08)

## 2017-08-21 LAB — PROTIME-INR
INR: 0.96
Prothrombin Time: 12.7 seconds (ref 11.4–15.2)

## 2017-08-21 LAB — CBG MONITORING, ED: Glucose-Capillary: 88 mg/dL (ref 65–99)

## 2017-08-21 NOTE — ED Notes (Signed)
ED Provider at bedside. 

## 2017-08-21 NOTE — ED Provider Notes (Signed)
[facility] EMERGENCY DEPARTMENT Provider Note   CSN: [id number] Arrival date & time: 08/21/17  1722   An emergency department physician performed an initial assessment on this suspected stroke patient at 30.  History   Chief Complaint Chief Complaint  Patient presents with  . Code Stroke    HPI Ashley [name] is a 41 y.o. female hx of intracranial hemorrhage from preeclampsia who presented with trouble speaking, confusion, right arm and leg numbness and weakness.  She states that she was driving around 4 PM and the symptoms acutely started.  She states that this is similar to her previous intracranial hemorrhage so came to the ED for evaluation.  Code stroke was activated in triage patient went immediately to CT head.  Patient states that her speech has improved but she still complains of right arm and leg weakness.   The history is provided by the patient.    Past Medical History:  Diagnosis Date  . Anxiety   . Depression   . ICH (intracerebral hemorrhage) ([place])   . Seizures ([place])   . Stroke [facility])     Patient Active Problem List   Diagnosis Date Noted  . Depression 03/23/2017  . Anxiety 03/23/2017  . Muscle weakness 02/03/2017  . History of hemorrhagic stroke with residual hemiparesis ([place]) 12/10/2016  . Spastic hemiplegia and hemiparesis affecting dominant side ([place]) 09/13/2015  . Adhesive capsulitis of right shoulder 08/24/2015  . Localization-related symptomatic epilepsy and epileptic syndromes with simple partial seizures, not intractable, without status epilepticus ([place]) 08/21/2015  . Nontraumatic cortical hemorrhage of cerebral hemisphere ([place])   . Seizure ([place])   . Seizures ([place])   . Adjustment disorder with depressed mood   . Contracture of muscle ankle and foot 06/11/2015  . Hemiplga fol ntrm intcrbl hemor aff right dominant side ([place]) 06/06/2015  . Left-sided intracerebral hemorrhage ([place]) 06/04/2015  . Seizure disorder as sequela of  cerebrovascular accident ([place])   . Seizure disorder ([place]) 06/03/2015  . Sepsis ([place]) 06/03/2015  . UTI (urinary tract infection) 06/03/2015  . Hypotension 06/02/2015  . Right spastic hemiparesis ([place]) 05/28/2015  . Aphasia following nontraumatic intracerebral hemorrhage 05/28/2015  . History of anxiety disorder 05/28/2015  . ICH (intracerebral hemorrhage) ([place])   . Seizure disorder, nonconvulsive, with status epilepticus ([place])   . Cerebral venous thrombosis of cortical vein   . Cytotoxic cerebral edema ([place])   . IVH (intraventricular hemorrhage) ([place])   . Term pregnancy 05/07/2015  . Spontaneous vaginal delivery 05/07/2015    Past Surgical History:  Procedure Laterality Date  . DILATION AND CURETTAGE OF UTERUS      OB History    Gravida Para Term Preterm AB Living   9 3 3   6 3    SAB TAB Ectopic Multiple Live Births   3 2   0 3       Home Medications    Prior to Admission medications   Medication Sig Start Date End Date Taking? Authorizing Provider  aspirin 81 MG chewable tablet Chew 81 mg by mouth daily.    [provider]  Cholecalciferol (VITAMIN D) 2000 units CAPS Take 2,000 Units by mouth daily.    [provider]  FLUoxetine (PROZAC) 40 MG capsule Take 2 capsules (80 mg total) by mouth daily. 07/20/17   Aundra Dubin, MD  Lacosamide 100 MG TABS Take 2 tablets twice a day 07/28/17   Cameron Sprang, MD  levETIRAcetam (KEPPRA) 750 MG tablet Take 2 tablets (1,500 mg  total) by mouth 2 (two) times daily. 07/28/17   Cameron Sprang, MD  LORazepam (ATIVAN) 1 MG tablet Take 1 tablet as needed for seizure 12/10/16   Cameron Sprang, MD  mirtazapine (REMERON) 30 MG tablet Take 1 tablet (30 mg total) by mouth at bedtime. 07/20/17 07/20/18  Aundra Dubin, MD  Multiple Vitamin (MULTIVITAMIN) tablet Take 1 tablet by mouth daily.     [provider]    Family History Family History  Problem Relation Age of Onset  . Cancer Mother   .  Cancer Father        pancreatic    Social History Social History   Tobacco Use  . Smoking status: Never Smoker  . Smokeless tobacco: Never Used  Substance Use Topics  . Alcohol use: Yes    Alcohol/week: 0.6 oz    Types: 1 Glasses of wine per week    Comment: casual   . Drug use: No     Allergies   Patient has no known allergies.   Review of Systems Review of Systems  Neurological: Positive for dizziness, speech difficulty and weakness.  All other systems reviewed and are negative.    Physical Exam Updated Vital Signs BP 101/83   Pulse 73   Temp 98.3 F (36.8 C)   Resp 15   Ht 5\' 3"  (1.6 m)   Wt 59 kg (130 lb)   SpO2 97%   BMI 23.03 kg/m   Physical Exam  Constitutional: She is oriented to person, place, and time. She appears well-developed.  HENT:  Head: Normocephalic.  Mouth/Throat: Oropharynx is clear and moist.  Eyes: Conjunctivae and EOM are normal. Pupils are equal, round, and reactive to light.  Neck: Normal range of motion. Neck supple.  Cardiovascular: Normal rate, regular rhythm and normal heart sounds.  Pulmonary/Chest: Effort normal and breath sounds normal. No stridor. No respiratory distress. She has no wheezes.  Abdominal: Soft. Bowel sounds are normal. She exhibits no distension. There is no tenderness. There is no guarding.  Musculoskeletal: Normal range of motion.  Neurological: She is alert and oriented to person, place, and time.  No facial droop. Strength 4/5 R arm and leg, 5/5 L side. Mild R pronator drift   Skin: Skin is warm.  Psychiatric: She has a normal mood and affect.  Nursing note and vitals reviewed.    ED Treatments / Results  Labs (all labs ordered are listed, but only abnormal results are displayed) Labs Reviewed  COMPREHENSIVE METABOLIC PANEL - Abnormal; Notable for the following components:      Result Value   Glucose, Bld 100 (*)    All other components within normal limits  I-STAT CHEM 8, ED - Abnormal; Notable  for the following components:   Chloride 97 (*)    Glucose, Bld 102 (*)    All other components within normal limits  PROTIME-INR  APTT  CBC  DIFFERENTIAL  I-STAT TROPONIN, ED  CBG MONITORING, ED    EKG  EKG Interpretation  Date/Time:  Friday August 21 2017 17:59:49 EST Ventricular Rate:  79 PR Interval:    QRS Duration: 103 QT Interval:  414 QTC Calculation: 475 R Axis:   85 Text Interpretation:  Sinus rhythm Consider left atrial enlargement No significant change since last tracing Confirmed by Wandra Arthurs [phone]) on 08/21/2017 6:13:59 PM       Radiology Ct Head Code Stroke Wo Contrast  Result Date: 08/21/2017 CLINICAL DATA:  Code stroke. Focal neuro deficit less  than 6 hours. Right hand numbness. Aphasia and altered mental status. EXAM: CT HEAD WITHOUT CONTRAST TECHNIQUE: Contiguous axial images were obtained from the base of the skull through the vertex without intravenous contrast. COMPARISON:  MRI 11/26/2016 FINDINGS: Brain: Chronic infarct distal left anterior cerebral artery territory unchanged from the prior study. Compensatory enlargement left lateral ventricle also unchanged. Negative for acute infarct, acute hemorrhage, or mass lesion. Vascular: Negative for hyperdense vessel Skull: Negative Sinuses/Orbits: Negative Other: None ASPECTS ([place] Stroke Program Early CT Score) - Ganglionic level infarction (caudate, lentiform nuclei, internal capsule, insula, M1-M3 cortex): 7 - Supraganglionic infarction (M4-M6 cortex): 3 Total score (0-10 with 10 being normal): 10 IMPRESSION: 1. No acute abnormality. Chronic left anterior cerebral artery infarct unchanged 2. ASPECTS is 10 These results were called by telephone at the time of interpretation on 08/21/2017 at 5:43 pm to Dr. Shirlyn Goltz , who verbally acknowledged these results. Electronically Signed   By: Franchot Gallo M.D.   On: 08/21/2017 17:44    Procedures Procedures (including critical care time)  Medications Ordered  in ED Medications - No data to display   Initial Impression / Assessment and Plan / ED Course  I have reviewed the triage vital signs and the nursing notes.  Pertinent labs & imaging results that were available during my care of the patient were reviewed by me and considered in my medical decision making (see chart for details).     Ashley [name] is a 41 y.o. female here with slurred speech, R sided weakness. Still weak on the right side. Code stroke activated but since patient had previous intra cranial bleeding and symptoms improving so will hold off on TPA per neuro. Will get MRI brain   8:28 PM MRI showed no stroke. Labs unremarkable. Weakness resolved now. Dr. Lorraine Lax saw patient and thought likely anxiety vs partial seizure. Patient is already on vimpat and keppra and told her to take night time dose as prescribed. She has neurology follow up outpatient.   Final Clinical Impressions(s) / ED Diagnoses   Final diagnoses:  None    ED Discharge Orders    None       Drenda Freeze, MD 08/21/17 2029

## 2017-08-21 NOTE — ED Provider Notes (Signed)
Patient screened on arrival.  She is here w <1 hr of aphasia, confusion and paresthesia in R UE.  With Hx of prior CVA, and new Sx, she was designated a code stroke and taken for emergent CT.   Carmin Muskrat, MD 08/21/17 1730

## 2017-08-21 NOTE — Code Documentation (Signed)
41 year old female presents to ED via pvt vehicle with code like symptoms.  She reports she was driving when she had sudden onset of right side numbness - she pulled off the road - tried to call her husband and had trouble finding her words.  Husband reports it took about 30 minutes to find her because she could not tell him where she was.  Patient with history of left parietal hemorrhage two years ago secondary to eclampsia. Dr. Lorraine Lax and stroke team to CT scan room - her symptoms began to improve with speech returning to baseline - remains with right side decreased sensation and right leg with weakness.  NIHSS 2.  BP was 111/62 HR 92 RR 20.  She was taken to MRI for stat DWI.  DWI negative - patient very emotional at this point describing stressful day with disturbing information prior to the event.  Code stroke cancelled per Dr. Lorraine Lax.  Handoff to [employer].

## 2017-08-21 NOTE — ED Triage Notes (Addendum)
Patient was driving car and had acute onset of confusion, right sided weakness and right face, arm and legs numbness and tingling and speech deficits. Patient has hx of ICH (contributed to eclampsia) with similar symptoms and residual right sided weakness. Pt sts weakness dissipated PTA, pt endorses improvement in speech and mild improvement of numbness. Symptoms started approximately 4 pm. Pt called family and pulled over but was unable to tell family location.

## 2017-08-21 NOTE — Consult Note (Signed)
Requesting Physician: Dr. Darl Householder    Chief Complaint: word finding difficulty , sensory symptoms   History obtained from:  Patient and Chart    HPI:                                                                                                                                       Ashley [name] is an 41 y.o. female with PMH of left parietal hemorrhage, seizures, depression who presents with aphasia and reduced sensation on the right side of body.  She has residual right leg weakness from prior stroke and reduced sensation on right hemibody at baseline. She was driving today and on the phone when she heard concerning news. She suddenly felt she has trouble getting words and left side felt numb. Her husband brought her to [facility] ER where she was stroke alerted.   Her symptoms started improving in CT scan and speech returned to baseline. She was shivering in CT scan and appeared.    Date last known well: 11.9.18 Time last known well: 4.30 pm tPA Given: no. Mild and improving symptoms  NIHSS: 2 Baseline MRS 1    Past Medical History:  Diagnosis Date  . Anxiety   . Depression   . ICH (intracerebral hemorrhage) ([place])   . Seizures ([place])   . Stroke [facility])     Past Surgical History:  Procedure Laterality Date  . DILATION AND CURETTAGE OF UTERUS      Family History  Problem Relation Age of Onset  . Cancer Mother   . Cancer Father        pancreatic   Social History:  reports that  has never smoked. she has never used smokeless tobacco. She reports that she drinks about 0.6 oz of alcohol per week. She reports that she does not use drugs.  Allergies: No Known Allergies  Medications:                                                                                                                        I reviewed home medications   ROS:  14 systems reviewed and  negative except above    Examination:                                                                                                      General: Appears well-developed and well-nourished.  Psych: Affect appropriate to situation Eyes: No scleral injection HENT: No OP obstrucion Head: Normocephalic.  Cardiovascular: Normal rate and regular rhythm.  Respiratory: Effort normal and breath sounds normal to anterior ascultation GI: Soft.  No distension. There is no tenderness.  Skin: WDI   Neurological Examination Mental Status: Alert, oriented, thought content appropriate.  Speech fluent without evidence of aphasia.  Able to follow 3 step commands without difficulty. Cranial Nerves: II: Visual fields grossly normal,  III,IV, VI: ptosis not present, extra-ocular motions intact bilaterally, pupils equal, round, reactive to light and accommodation V,VII: smile symmetric, facial light touch sensation normal bilaterally VIII: hearing normal bilaterally IX,X: uvula rises symmetrically XI: bilateral shoulder shrug XII: midline tongue extension Motor: Right : Upper extremity   5/5    Left:     Upper extremity   5/5  Lower extremity   4/5     Lower extremity   5/5 Tone and bulk:normal tone throughout; no atrophy noted Sensory: reduced sensation to light touch over left arm, leg and face Deep Tendon Reflexes: 2+ and symmetric throughout Plantars: Right: downgoing   Left: downgoing Cerebellar: normal finger-to-nose, normal rapid alternating movements and normal heel-to-shin test Gait: normal gait and station     Lab Results: Basic Metabolic Panel: Recent Labs  Lab 08/21/17 1748  NA 137  K 3.7  CL 97*  GLUCOSE 102*  BUN 16  CREATININE 0.70    CBC: Recent Labs  Lab 08/21/17 1734 08/21/17 1748  WBC 7.0  --   NEUTROABS 4.7  --   HGB 12.4 13.6  HCT 37.1 40.0  MCV 95.1  --   PLT 221  --     Coagulation Studies: Recent Labs    08/21/17 1734  LABPROT 12.7  INR 0.96     Imaging: Ct Head Code Stroke Wo Contrast  Result Date: 08/21/2017 CLINICAL DATA:  Code stroke. Focal neuro deficit less than 6 hours. Right hand numbness. Aphasia and altered mental status. EXAM: CT HEAD WITHOUT CONTRAST TECHNIQUE: Contiguous axial images were obtained from the base of the skull through the vertex without intravenous contrast. COMPARISON:  MRI 11/26/2016 FINDINGS: Brain: Chronic infarct distal left anterior cerebral artery territory unchanged from the prior study. Compensatory enlargement left lateral ventricle also unchanged. Negative for acute infarct, acute hemorrhage, or mass lesion. Vascular: Negative for hyperdense vessel Skull: Negative Sinuses/Orbits: Negative Other: None ASPECTS ([place] Stroke Program Early CT Score) - Ganglionic level infarction (caudate, lentiform nuclei, internal capsule, insula, M1-M3 cortex): 7 - Supraganglionic infarction (M4-M6 cortex): 3 Total score (0-10 with 10 being normal): 10 IMPRESSION: 1. No acute abnormality. Chronic left anterior cerebral artery infarct unchanged 2. ASPECTS is 10 These results were called by telephone at the time of interpretation on 08/21/2017 at 5:43 pm to Dr. Shirlyn Goltz , who verbally acknowledged  these results. Electronically Signed   By: Franchot Gallo M.D.   On: 08/21/2017 17:44     ASSESSMENT AND PLAN  41 y.o. female with PMH of left parietal hemorrhage, seizures, anxiety and depression who presents with aphasia and reduced sensation on the right side of body. Symptoms mild and not disabling.  She did not lose consciousness. Also, gradually improving, affected same side of hemorrhage and still present when MRI performed.   MRI negative for stroke. Likely anxiety vs simple partial seizure.   Continue Keppra 1.5 g  BID Continue Vimpat 150mg  BID   Sushanth Aroor [facility] Pager Number [phone]

## 2017-08-21 NOTE — Discharge Instructions (Signed)
Continue vimpat and keppra as prescribed.   See your neurologist   Return to ER if you have confusion, weakness, numbness, recurrent seizure like activity.

## 2017-08-31 ENCOUNTER — Ambulatory Visit (INDEPENDENT_AMBULATORY_CARE_PROVIDER_SITE_OTHER): Payer: BLUE CROSS/BLUE SHIELD | Admitting: Licensed Clinical Social Worker

## 2017-08-31 ENCOUNTER — Encounter (HOSPITAL_COMMUNITY): Payer: Self-pay | Admitting: Licensed Clinical Social Worker

## 2017-08-31 DIAGNOSIS — F419 Anxiety disorder, unspecified: Secondary | ICD-10-CM

## 2017-08-31 DIAGNOSIS — F329 Major depressive disorder, single episode, unspecified: Secondary | ICD-10-CM | POA: Diagnosis not present

## 2017-08-31 DIAGNOSIS — F332 Major depressive disorder, recurrent severe without psychotic features: Secondary | ICD-10-CM

## 2017-08-31 NOTE — Progress Notes (Signed)
   THERAPIST PROGRESS NOTE  Session Time: 9:10-10am  Participation Level: Active  Behavioral Response: CasualAlertAnxious  Type of Therapy: Individual Therapy  Treatment Goals addressed: Coping  Interventions: CBT  Summary: Ashley [name] is a 41 y.o. female who presents for her  individual counseling session. Pt discussed her psychiatric symptoms and current life events. Pt had her arm in a sling. She fell and fractured her upper arm. Pt still presents anxious. Pt is moving forward with her separation. She is wary about her inability to support herself. And her children. Used immediate feedback with pt discussing finances. Used empathetic reflection with pt where she was upset at herself for the choices she has made. Pt is concerned about the holidays for her children. Assisted pt in building hope and strengthening resiliency.     Suicidal/Homicidal: Nowithout intent/plan  Therapist Response: Assessed pt's current functioning by self report and reviewed progress. Assisted pt processing her maritial decision, empathatic reflection, assisted pt in building hope and strengthening resiliency. .  Plan: Return again in 2 weeks.  Diagnosis: Axis I: Anxiety and depression        [name],[name], LCAS 08/31/2017

## 2017-09-14 ENCOUNTER — Encounter (HOSPITAL_COMMUNITY): Payer: Self-pay | Admitting: Licensed Clinical Social Worker

## 2017-09-14 ENCOUNTER — Ambulatory Visit (HOSPITAL_COMMUNITY): Payer: BLUE CROSS/BLUE SHIELD | Admitting: Licensed Clinical Social Worker

## 2017-09-14 ENCOUNTER — Telehealth (HOSPITAL_COMMUNITY): Payer: Self-pay

## 2017-09-14 DIAGNOSIS — F332 Major depressive disorder, recurrent severe without psychotic features: Secondary | ICD-10-CM

## 2017-09-14 NOTE — Telephone Encounter (Signed)
Medication management - Met with patient and then with Dr. Daron Offer to relay her concerns she has been feeling shaky and jittery over the past few days.  Provider reviewed record and will decrease her Prozac to 40 mg, one a day until he sees her 10/02/17.  Met with patient to inform of Dr. Joycelyn Schmid instructions and patient will call back if symptoms persist of if any other issues until he sees her back on 10/02/17. Patient was in for individual therapy this date.

## 2017-09-14 NOTE — Progress Notes (Signed)
   THERAPIST PROGRESS NOTE  Session Time: 9:10-10am  Participation Level: Active  Behavioral Response: CasualAlertAnxious  Type of Therapy: Individual Therapy  Treatment Goals addressed: Coping  Interventions: CBT  Summary: Ashley [name] is a 41 y.o. female who presents for her  individual counseling session. Pt discussed her psychiatric symptoms and current life events. Ashley [name] reports her psychiatric symptoms have increased. She is easily irritated, shaky and jittery. I asked S. Lovena Le, RN, come in to speak to Pt and he will follow up with Dr. Daron Offer. At pt's last appt with Dr. Sharyon Medicus he has increased her prozac and remeron. Pt still struggles with her upcoming separation from her husband. She has mixed feelings and is concerned about her future. Taught pt problem solving skills to assist with decisions. Pt reports she is using her self soothing skills to assist her in this stressful time.      Suicidal/Homicidal: Nowithout intent/plan  Therapist Response: Assessed pt's current functioning by self report and reviewed progress. Assisted pt processing her maritial decision, decision making skills, self soothing..  Plan: Return again in 2 weeks.  Diagnosis: Axis I: Anxiety and depression        Janeane Cozart S, LCAS 09/14/2017

## 2017-09-28 ENCOUNTER — Ambulatory Visit (HOSPITAL_COMMUNITY): Payer: Self-pay | Admitting: Licensed Clinical Social Worker

## 2017-10-01 ENCOUNTER — Ambulatory Visit (HOSPITAL_COMMUNITY): Payer: Self-pay | Admitting: Licensed Clinical Social Worker

## 2017-10-01 ENCOUNTER — Encounter (HOSPITAL_COMMUNITY): Payer: Self-pay | Admitting: Licensed Clinical Social Worker

## 2017-10-01 ENCOUNTER — Ambulatory Visit (INDEPENDENT_AMBULATORY_CARE_PROVIDER_SITE_OTHER): Payer: BLUE CROSS/BLUE SHIELD | Admitting: Licensed Clinical Social Worker

## 2017-10-01 DIAGNOSIS — F419 Anxiety disorder, unspecified: Secondary | ICD-10-CM | POA: Diagnosis not present

## 2017-10-01 DIAGNOSIS — F332 Major depressive disorder, recurrent severe without psychotic features: Secondary | ICD-10-CM | POA: Diagnosis not present

## 2017-10-01 NOTE — Progress Notes (Signed)
   THERAPIST PROGRESS NOTE  Session Time: 9:10-10am  Participation Level: Active  Behavioral Response: CasualAlertAnxious  Type of Therapy: Individual Therapy  Treatment Goals addressed: Coping  Interventions: CBT  Summary: Ashley [name] is a 41 y.o. female who presents for her  individual counseling session. Pt discussed her psychiatric symptoms and current life events. Pt reports her medications are working well now. Pt was anxious today. She and her husband have begun the discussion of separation. Pt has concerns that she will be able to support herself and her children. Helped  pt process her future goals, including housing, budgeting, talking to the children, new lifestyle, divorce, fear. Showed pt breathing exercises in session due to her anxiety.         Suicidal/Homicidal: Nowithout intent/plan  Therapist Response: Assessed pt's current functioning by self report and reviewed progress. Assisted pt processing her maritial decision, future goals, breathing exercises, new lifestyles..  Plan: Return again in 2 weeks.  Diagnosis: Axis I: Anxiety and depression        Clotilda Hafer S, LCAS 10/01/2017

## 2017-10-02 ENCOUNTER — Ambulatory Visit (HOSPITAL_COMMUNITY): Payer: Self-pay | Admitting: Psychiatry

## 2017-10-16 ENCOUNTER — Telehealth: Payer: Self-pay | Admitting: Neurology

## 2017-10-16 NOTE — Telephone Encounter (Signed)
Spoke with pt relaying message below.  Pt states that she has about a weeks worth of Vimpat.  She is available to come next week.  Appointment scheduled.  I let her know to call us if she gets low on VImpat and I will set samples at our front desk for her to pick up

## 2017-10-16 NOTE — Telephone Encounter (Signed)
There are other seizure medications we can use, but she cannot stop the Vimpat cold [place]. Please give her samples of Vimpat 200mg  BID then see if she can f/u with me on 1/9 at 4pm so we can talk about other medications and give instructions for tapering off Vimpat and starting new med. Thanks

## 2017-10-16 NOTE — Telephone Encounter (Signed)
Patient just changed ins and vimpat is no longer covered under her plan please call she is not sure if there is something else she can try

## 2017-10-19 ENCOUNTER — Encounter (HOSPITAL_COMMUNITY): Payer: Self-pay | Admitting: Licensed Clinical Social Worker

## 2017-10-19 ENCOUNTER — Ambulatory Visit (INDEPENDENT_AMBULATORY_CARE_PROVIDER_SITE_OTHER): Payer: BLUE CROSS/BLUE SHIELD | Admitting: Licensed Clinical Social Worker

## 2017-10-19 DIAGNOSIS — F332 Major depressive disorder, recurrent severe without psychotic features: Secondary | ICD-10-CM | POA: Diagnosis not present

## 2017-10-19 DIAGNOSIS — F419 Anxiety disorder, unspecified: Secondary | ICD-10-CM | POA: Diagnosis not present

## 2017-10-19 NOTE — Progress Notes (Signed)
   THERAPIST PROGRESS NOTE  Session Time: 9:10-10am  Participation Level: Active  Behavioral Response: CasualAlertAnxious  Type of Therapy: Individual Therapy  Treatment Goals addressed: Coping  Interventions: CBT  Summary: Ashley [name] is a 42 y.o. female who presents for her  individual counseling session. Pt discussed her psychiatric symptoms and current life events. Pt reports her medications are working well now. Pt was anxious today.  She and her husband continue having  discussion of separation. Pt struggles with her emotional regulation skills. Taught pt self soothing techniques to use. Pt reports sh has had no anxiety or panic attacks since last session. Pt reports she does have tightening in her chest on occasion. Continue to teach and encourage pt to use her breathing exercises and meditation.    Suicidal/Homicidal: Nowithout intent/plan  Therapist Response: Assessed pt's current functioning by self report and reviewed progress. Assisted pt processing her maritial decision, future goals, breathing exercises self soothing, emotional regulation skills..  Plan: Return again in 2 weeks.  Diagnosis: Axis I: Anxiety and depression        Angela Vazguez S, LCAS 10/19/2017

## 2017-10-21 ENCOUNTER — Encounter: Payer: Self-pay | Admitting: Neurology

## 2017-10-21 ENCOUNTER — Ambulatory Visit (INDEPENDENT_AMBULATORY_CARE_PROVIDER_SITE_OTHER): Payer: BLUE CROSS/BLUE SHIELD | Admitting: Neurology

## 2017-10-21 VITALS — BP 92/68 | HR 72 | Ht 63.0 in | Wt 131.0 lb

## 2017-10-21 DIAGNOSIS — G40109 Localization-related (focal) (partial) symptomatic epilepsy and epileptic syndromes with simple partial seizures, not intractable, without status epilepticus: Secondary | ICD-10-CM

## 2017-10-21 MED ORDER — LEVETIRACETAM 750 MG PO TABS: 1500.0000 mg | ORAL_TABLET | Freq: Two times a day (BID) | ORAL | 11 refills | Status: DC

## 2017-10-21 MED ORDER — OXCARBAZEPINE 300 MG PO TABS: ORAL_TABLET | ORAL | 3 refills | Status: DC

## 2017-10-21 NOTE — Progress Notes (Signed)
NEUROLOGY FOLLOW UP OFFICE NOTE  Ashley [name] [id number] 02-12-1976  HISTORY OF PRESENT ILLNESS: I had the pleasure of seeing Ashley [name] in follow-up in the neurology clinic on 10/21/2017. The patient was last seen 3 months ago for seizures. She is again accompanied by her husband who helps supplement the history today. Since her last visit, she was in the ER on 08/21/17 for trouble speaking, confusion, right arm and leg numbness and weakness. She feels this was a panic attack, she was driving home from a meeting where she had been upset, then she suddenly did not know what to do and started shaking. She called her husband and could talk, but could not tell anybody where she was. She pulled over and knew something was going on, her husband reports she was saying things "off the wall," such as saying she "just passed daddy's room" when he asked where she was. She feels right arm tingling lasted 10 minutes. In the ER she had mild right-sided weakness but symptoms were improving, then resolved in a few hours. Considerations included anxiety versus partial seizure. She continues on Keppra 1500mg  BID and Vimpat 200mg  BID. On her last visit, she was reporting episodes where she suddenly could not hear, this has not been happening. She called our office reporting that Vimpat would not be covered with her new insurance.  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 She had focal seizures in 2016 with EEG showing left PLEDs and has been on Keppra 1500mg  BID since then with no further focal motor seizures, however she would have occasional episodes around once a month where she could not get her words out, lasting 3-4 minutes. Sometimes she would get hot and have to sit but her speech would not be affected. On 11/27/16, she started having the same speech difficulties where she was  making paraphasic errors, but it continued for 30 minutes and they decided to go to the ER. In the ER, she was noted to be aphasic, then as she was having a head CT, she suddenly had right gaze and head deviation, right arm and leg flexion and shaking for around 3 minutes. She was given IV Ativan but her right leg continued to shake in a rhythmic fashion. She was given additional Keppra and started on Vimpat with no further seizures, back to baseline in a couple of hours. She initially had some difficulties with the Vimpat 100mg  BID where she had hallucinations of hearing music or voices, but these have resolved over the past week. She denies any side effects on Keppra 1500mg  BID. She denies any dizziness, diplopia, gait instability. She has a right foot drop, denies any numbness/tingling on the right side but states it feels different that the other side. She denies any olfactory/gustatory hallucinations, myoclonic jerks, episodes of staring/unresponsiveness, gaps in time. She has not had any further episodes of paraphasic errors since her hospitalization. She had a mild diffuse headache for a couple of days but this has resolved. She denies any dizziness, dysarthria/dysphagia, neck/back pain, bowel/bladder dysfunction.  Records from her hospitalization in August 2016 were reviewed. She has been seeing neurologist Dr. Leonie Man for post-stroke and seizure care since then, last visit was in August 2017. She had a severe headache for several days in August 2016 2 weeks post-partum. She then developed right-sided weakness, aphasia, then a focal seizure in  the hospital. She was found to have a large left parietal ICH with IVH, cerebral edema, obstructive hydrocephalus and subarachnoid hemorrhage. MRA and MRV were unremarkable. Concern was for cerebral cortical vein thrombosis not seen on MRV, however RCVS was also a consideration. She was treated with hypertonic saline and stabilized. She had a seizure with EEG showing  left PLEDs. She was started on Keppra. Hypercoagulable labs were normal. She was discharged to rehab and had another seizure on 06/01/16 with report of eyes rolling back and upper extremities shaking. Keppra dose was increased. She had a repeat MRI brain in September 2016 with resolving subacute left parietal hematoma. There was acute gyral swelling and T2 hyperintensity in the left hippocampal formation, left insula, and operculum, that resolved with repeat MRI 09/2015 showing resolution of these changes. She was initially having some headaches and low dose Topamax was briefly added, then with resolution of headaches, this was tapered off. She had been dealing with post-stroke depression and symptoms had improved, however with limitations on driving again, she is starting to feel depressed again. She has three young children, the youngest is 101 months old. She was not happy with neuro rehab and has a private physical therapist coming weekly where she pushes herself and has made a ton of improvement. She does not like to wear her AFO and her husband did not notice any difference in gait with the AFO.   Epilepsy Risk Factors:  History of large left parietal hematoma with encephalomalacia. Otherwise she had a normal birth and early development.  There is no history of febrile convulsions, CNS infections such as meningitis/encephalitis, significant traumatic brain injury, neurosurgical procedures, or family history of seizures.  Diagnostic Data: I personally reviewed MRI brain 11/26/2016 which did not show any acute changes. There was left paramedian parietal encephalomalacia with extensive hemosiderin staining extending into the splenium of the corpus callosum, ex vacuo dilatation of the left lateral ventricle occipital horn, increased side of the temporal horn of the left lateral ventricle which may be due to decreased volume of the hippocampus. It is smaller in size with increased signal compared to the  right. EEG 11/27/16 showed mild left hemispheric slowing EEG 05/22/15: left hemisphere slowing, rare epileptiform discharges over the left anterior temporal region EEG 05/21/15: PLEDs on the left hemisphere, mainly involving the temporo-parietal region  Prior AEDs: Topamax PAST MEDICAL HISTORY: Past Medical History:  Diagnosis Date  . Anxiety   . Depression   . ICH (intracerebral hemorrhage) ([place])   . Seizures ([place])   . Stroke [facility])     MEDICATIONS: Current Outpatient Medications on File Prior to Visit  Medication Sig Dispense Refill  . aspirin 81 MG chewable tablet Chew 81 mg by mouth daily.    . Cholecalciferol (VITAMIN D) 2000 units CAPS Take 2,000 Units by mouth daily.    Marland Kitchen FLUoxetine (PROZAC) 40 MG capsule Take 2 capsules (80 mg total) by mouth daily. 180 capsule 1  . Lacosamide 100 MG TABS Take 2 tablets twice a day 120 tablet 5  . levETIRAcetam (KEPPRA) 750 MG tablet Take 2 tablets (1,500 mg total) by mouth 2 (two) times daily. 120 tablet 11  . LORazepam (ATIVAN) 1 MG tablet Take 1 tablet as needed for seizure 10 tablet 3  . mirtazapine (REMERON) 30 MG tablet Take 1 tablet (30 mg total) by mouth at bedtime. 90 tablet 1  . Multiple Vitamin (MULTIVITAMIN) tablet Take 1 tablet by mouth daily.      Current Facility-Administered Medications on  File Prior to Visit  Medication Dose Route Frequency Provider Last Rate Last Dose  . gadopentetate dimeglumine (MAGNEVIST) injection 12 mL  12 mL Intravenous Once PRN Garvin Fila, MD        ALLERGIES: No Known Allergies  FAMILY HISTORY: Family History  Problem Relation Age of Onset  . Cancer Mother   . Cancer Father        pancreatic    SOCIAL HISTORY: Social History   Socioeconomic History  . Marital status: Married    Spouse name: Not on file  . Number of children: Not on file  . Years of education: Not on file  . Highest education level: Not on file  Social Needs  . Financial resource strain: Not on file  . Food  insecurity - worry: Not on file  . Food insecurity - inability: Not on file  . Transportation needs - medical: Not on file  . Transportation needs - non-medical: Not on file  Occupational History  . Not on file  Tobacco Use  . Smoking status: Never Smoker  . Smokeless tobacco: Never Used  Substance and Sexual Activity  . Alcohol use: Yes    Alcohol/week: 0.6 oz    Types: 1 Glasses of wine per week    Comment: casual   . Drug use: No  . Sexual activity: Yes    Birth control/protection: None    Comment: husband has vasectomy  Other Topics Concern  . Not on file  Social History Narrative  . Not on file    REVIEW OF SYSTEMS: Constitutional: No fevers, chills, or sweats, no generalized fatigue, change in appetite Eyes: No visual changes, double vision, eye pain Ear, nose and throat: No hearing loss, ear pain, nasal congestion, sore throat Cardiovascular: No chest pain, palpitations Respiratory:  No shortness of breath at rest or with exertion, wheezes GastrointestinaI: No nausea, vomiting, diarrhea, abdominal pain, fecal incontinence Genitourinary:  No dysuria, urinary retention or frequency Musculoskeletal:  No neck pain, back pain Integumentary: No rash, pruritus, skin lesions Neurological: as above Psychiatric: No depression, insomnia, anxiety Endocrine: No palpitations, fatigue, diaphoresis, mood swings, change in appetite, change in weight, increased thirst Hematologic/Lymphatic:  No anemia, purpura, petechiae. Allergic/Immunologic: no itchy/runny eyes, nasal congestion, recent allergic reactions, rashes  PHYSICAL EXAM: Vitals:   10/21/17 1615  BP: 92/68  Pulse: 72  SpO2: 96%   General: No acute distress Head:  Normocephalic/atraumatic Neck: supple, no paraspinal tenderness, full range of motion Heart:  Regular rate and rhythm Lungs:  Clear to auscultation bilaterally Back: No paraspinal tenderness Skin/Extremities: No rash, no edema Neurological Exam: alert and  oriented to person, place, and time. No aphasia or dysarthria. Fund of knowledge is appropriate.  Recent and remote memory are intact.  Attention and concentration are normal.    Able to name objects and repeat phrases. are normal.    Able to name objects and repeat phrases. Cranial nerves: CN I: not tested CN II: pupils equal, round and reactive to light, visual fields intact, fundi unremarkable. CN III, IV, VI:  full range of motion, no nystagmus CN V: decreased on right V1-3 (unchanged) CN VII: upper and lower face symmetric CN VIII: hearing intact to finger rub CN IX, X: gag intact, uvula midline CN XI: sternocleidomastoid and trapezius muscles intact CN XII: tongue midline Bulk & Tone: increased tone on right LE (more on right ankle), no fasciculations. Motor: 0/5 right foot dorsiflexion (wearing AFO), eversion/inversion, 2/5 plantarflexion. Otherwise 5/5 throughout with no pronator drift.  Sensation: decreased on right UE and LE (unchanged) Deep Tendon Reflexes: brisk +3 on the right UE and LE, no clonus, +2 on left UE and LE Plantar responses: downgoing bilaterally Cerebellar: no incoordination on finger to nose testing Gait: spastic hemiparetic gait with foot circumduction (unchanged) Tremor: none  IMPRESSION: This is a pleasant 42 yo RH woman with a history of large left ICH and focal seizures in August 2016 with residual right foot drop. She was doing well initially on Keppra 1500mg  BID until 11/27/15 when she had a prolonged episode of paraphasic errors followed by a right-sided focal seizure. Vimpat was added. She was back in the ER last 08/21/17 for paraphasic errors and worsened right-sided weakness. She feels it may have been a panic attack, but since symptoms were similar to last seizure and there was some confusion/loss of awareness associated, we discussed treating this as a breakthrough seizure. Vimpat is now cost-prohibitive, in addition, she had the episode while on Vimpat  200mg  BID. She will start oxcarbazepine (to help with mood as well), 150mg  BID x 1 week, then 300mg  BID x 1 week, then 600mg  BID. Side effects were discussed. Continue Vimpat for another 2 weeks, then start tapering off every week. Continue Keppra 1500mg  BID. We again discussed [place] driving laws to stop driving after a seizure until 6 months seizure-free. She will follow-up as scheduled in March 2019 and knows to call for any changes.  Thank you for allowing me to participate in her care.  Please do not hesitate to call for any questions or concerns.  The duration of this appointment visit was 25 minutes of face-to-face time with the patient.  Greater than 50% of this time was spent in counseling, explanation of diagnosis, planning of further management, and coordination of care.   Ellouise Newer, M.D.   CC: Dr. Brigitte Pulse

## 2017-10-21 NOTE — Patient Instructions (Addendum)
1. Start oxcarbazepine 300mg : Take 1/2 tablet twice a day for a week, then increase to 1 tablet twice a day for a week, then increase to 2 tablets twice a day and continue 2. Continue Vimpat 200mg  twice a day for 2 weeks, then reduce to 100mg  twice a day for a week, then 50mg  twice a day for a week, then 50mg  daily for a week, then stop. 3. Continue Keppra 1500mg  twice a day 4. Follow-up as scheduled in March, call for any problems  Seizure Precautions:. 1. If medication has been prescribed for you to prevent seizures, take it exactly as directed.  Do not stop taking the medicine without talking to your doctor first, even if you have not had a seizure in a long time.   2. Avoid activities in which a seizure would cause danger to yourself or to others.  Don't operate dangerous machinery, swim alone, or climb in high or dangerous places, such as on ladders, roofs, or girders.  Do not drive unless your doctor says you may.  3. If you have any warning that you may have a seizure, lay down in a safe place where you can't hurt yourself.    4.  No driving for 6 months from last seizure, as per [facility].   Please refer to the following link on the [employer] website for more information: http://[email or web address].org/[email or web address]/driving/drivingu.cfm   5.  Maintain good sleep hygiene. Avoid alcohol.  6.  Notify your neurology if you are planning pregnancy or if you become pregnant.  7.  Contact your doctor if you have any problems that may be related to the medicine you are taking.  8.  Call 911 and bring the patient back to the ED if:        A.  The seizure lasts longer than 5 minutes.       B.  The patient doesn't awaken shortly after the seizure  C.  The patient has new problems such as difficulty seeing, speaking or moving  D.  The patient was injured during the seizure  E.  The patient has a temperature over 102 F (39C)  F.  The patient vomited  and now is having trouble breathing

## 2017-10-26 ENCOUNTER — Ambulatory Visit (HOSPITAL_COMMUNITY): Payer: Self-pay | Admitting: Psychiatry

## 2017-10-27 ENCOUNTER — Encounter (HOSPITAL_COMMUNITY): Payer: Self-pay | Admitting: Licensed Clinical Social Worker

## 2017-10-27 ENCOUNTER — Ambulatory Visit (INDEPENDENT_AMBULATORY_CARE_PROVIDER_SITE_OTHER): Payer: BLUE CROSS/BLUE SHIELD | Admitting: Licensed Clinical Social Worker

## 2017-10-27 ENCOUNTER — Encounter: Payer: Self-pay | Admitting: Occupational Therapy

## 2017-10-27 DIAGNOSIS — F332 Major depressive disorder, recurrent severe without psychotic features: Secondary | ICD-10-CM

## 2017-10-27 DIAGNOSIS — I69851 Hemiplegia and hemiparesis following other cerebrovascular disease affecting right dominant side: Secondary | ICD-10-CM

## 2017-10-27 NOTE — Progress Notes (Signed)
   THERAPIST PROGRESS NOTE  Session Time: 9:10-10am  Participation Level: Active  Behavioral Response: CasualAlertAnxious  Type of Therapy: Individual Therapy  Treatment Goals addressed: Coping  Interventions: CBT  Summary: Ashley [name] is a 42 y.o. female who presents for her  individual counseling session. Pt discussed her psychiatric symptoms and current life events. Pt reports her medications are working well now. Pt was anxious today.  She and her husband continue having  discussion of separation. Pt  Continues to struggle with her emotional regulation skills. Taught pt more self soothing techniques to use. Pt reports she has had no anxiety or panic attacks since last session. Pt reports she continues to have tightening in her chest. Continue to  encourage pt to use her breathing exercises and meditation.    Suicidal/Homicidal: Nowithout intent/plan  Therapist Response: Assessed pt's current functioning by self report and reviewed progress. Assisted pt processing her maritial decision, future goals, breathing exercises self soothing, emotional regulation skills..  Plan: Return again in 2 weeks.  Diagnosis: Axis I: Anxiety and depression        [name],[name], LCAS 10/27/2017

## 2017-10-27 NOTE — Therapy (Signed)
[facility] [address] [place], [place], [address] Phone: [phone]   Fax:  [phone]  Occupational Therapy Treatment  Patient Details  Name: Ashley [name] MRN: [id number] Date of Birth: 08/23/1976 No Data Recorded  Encounter Date: 10/27/2017    Past Medical History:  Diagnosis Date  . Anxiety   . Depression   . ICH (intracerebral hemorrhage) (HCC)   . Seizures (HCC)   . Stroke (HCC)     Past Surgical History:  Procedure Laterality Date  . DILATION AND CURETTAGE OF UTERUS      There were no vitals filed for this visit.                          OT Short Term Goals - 10/27/17 1154      OT SHORT TERM GOAL #1   Title  Pt will be mod I with HEP - 03/24/2017    Status  Achieved      OT SHORT TERM GOAL #2   Title  Pt will be able to go from sit to stand from low surfaces mod I.    Status  Achieved      OT SHORT TERM GOAL #3   Title  Pt will demonstrate ability to pick up items off floor without LOB    Status  Achieved        OT Long Term Goals - 10/27/17 1154      OT LONG TERM GOAL #1   Title  Pt will be mod I with upgraded HEP - 04/21/2017    Status  Achieved      OT LONG TERM GOAL #2   Title  Pt will be able to pick up 42 year old child out of crib and carry at least 10 feet     Status  Achieved      OT LONG TERM GOAL #3   Title  Pt will demonstrate improved shoulder flexion to 130* to improve overhead reach for functional tasks    Status  Achieved      OT LONG TERM GOAL #4   Title  Pt will be able to pick up heavy items from grocery store using both hands without LOB.    Status  Partially Met            Plan - 10/27/17 1154    Clinical Impression Statement  Pt wished to be placed on hold with understanding that new goals would be established if she wished to return to therapy. Pt has not returned therefore will d/c from OT today.        Patient will benefit  from skilled therapeutic intervention in order to improve the following deficits and impairments:     Visit Diagnosis: Spastic hemiplegia of right dominant side as late effect of other cerebrovascular disease (HCC)    Problem List Patient Active Problem List   Diagnosis Date Noted  . Depression 03/23/2017  . Anxiety 03/23/2017  . Muscle weakness 02/03/2017  . History of hemorrhagic stroke with residual hemiparesis (HCC) 12/10/2016  . Spastic hemiplegia and hemiparesis affecting dominant side (HCC) 09/13/2015  . Adhesive capsulitis of right shoulder 08/24/2015  . Localization-related symptomatic epilepsy and epileptic syndromes with simple partial seizures, not intractable, without status epilepticus (HCC) 08/21/2015  . Nontraumatic cortical hemorrhage of cerebral hemisphere (HCC)   . Seizure (HCC)   . Seizures (HCC)   . Adjustment disorder with depressed mood   . Contracture   of muscle ankle and foot 06/11/2015  . Hemiplga fol ntrm intcrbl hemor aff right dominant side ([place]) 06/06/2015  . Left-sided intracerebral hemorrhage ([place]) 06/04/2015  . Seizure disorder as sequela of cerebrovascular accident ([place])   . Seizure disorder ([place]) 06/03/2015  . Sepsis ([place]) 06/03/2015  . UTI (urinary tract infection) 06/03/2015  . Hypotension 06/02/2015  . Right spastic hemiparesis ([place]) 05/28/2015  . Aphasia following nontraumatic intracerebral hemorrhage 05/28/2015  . History of anxiety disorder 05/28/2015  . ICH (intracerebral hemorrhage) ([place])   . Seizure disorder, nonconvulsive, with status epilepticus ([place])   . Cerebral venous thrombosis of cortical vein   . Cytotoxic cerebral edema ([place])   . IVH (intraventricular hemorrhage) ([place])   . Term pregnancy 05/07/2015  . Spontaneous vaginal delivery 05/07/2015   OCCUPATIONAL THERAPY DISCHARGE SUMMARY  Visits from Start of Care: 63  Current functional level related to goals / functional outcomes: See above   Remaining deficits: Decreased  balance, impaired functional mobility, spastic hemiplegia   Education / Equipment: HEP Plan: Patient agrees to discharge.  Patient goals were met. Patient is being discharged due to not returning since the last visit.  ?????      Quay Burow, OTR/L 10/27/2017, 11:55 AM  [facility] [address] [place], [place], [address] Phone: [phone]   Fax:  [phone]  Name: Donnetta Gillin MRN: [id number] Date of Birth: [birth year]

## 2017-10-28 ENCOUNTER — Telehealth: Payer: Self-pay | Admitting: Neurology

## 2017-10-28 NOTE — Telephone Encounter (Signed)
Ashley [name] called needing to get Prior Auth for this patient. The Fax # is [phone].She Lmom saying that it needed Immediate attention. Thanks

## 2017-10-29 NOTE — Telephone Encounter (Signed)
This is for Vimpat.  No return call.  No prior authorization needed.  Per LOV note, pt is to taper off of Vimpat.  Pt given enough in samples for taper.

## 2017-11-03 ENCOUNTER — Encounter (HOSPITAL_COMMUNITY): Payer: Self-pay | Admitting: Licensed Clinical Social Worker

## 2017-11-03 ENCOUNTER — Ambulatory Visit (INDEPENDENT_AMBULATORY_CARE_PROVIDER_SITE_OTHER): Payer: BLUE CROSS/BLUE SHIELD | Admitting: Licensed Clinical Social Worker

## 2017-11-03 DIAGNOSIS — F419 Anxiety disorder, unspecified: Secondary | ICD-10-CM | POA: Diagnosis not present

## 2017-11-03 DIAGNOSIS — F332 Major depressive disorder, recurrent severe without psychotic features: Secondary | ICD-10-CM

## 2017-11-03 NOTE — Progress Notes (Signed)
   THERAPIST PROGRESS NOTE  Session Time: 2:10-3pm  Participation Level: Active  Behavioral Response: CasualAlertAnxious  Type of Therapy: Individual Therapy  Treatment Goals addressed: Coping  Interventions: CBT  Summary: Reesha Debes is a 42 y.o. female who presents for her  individual counseling session. Pt discussed her psychiatric symptoms and current life events. Pt still continues to have tightness in her chest due to the stress of her marital issues.Reminded pt of self soothing skills useful for her her anxiety. Practiced self soothing with pt. Pt is considering staying in the marriage for the moment. Asked pt open ended questions. Talked with pt about rebuilding trust in a relationship. Pt and her husband are interested in marriage counselor. I will talk with CM for a referral. Pt still struggles with boundaries with her mother. Role play boundaries with pt.    Suicidal/Homicidal: Nowithout intent/plan  Therapist Response: Assessed pt's current functioning by self report and reviewed progress. Assisted pt processing her maritial decision, building trust, boundaries, self soothing, Assisted pt processing for the management of her stressors.  Plan: Return again in 2 weeks.  Diagnosis: Axis I: Anxiety and depression        Shavonda Wiedman S, LCAS 11/03/2017

## 2017-11-09 ENCOUNTER — Ambulatory Visit (INDEPENDENT_AMBULATORY_CARE_PROVIDER_SITE_OTHER): Payer: BLUE CROSS/BLUE SHIELD | Admitting: Licensed Clinical Social Worker

## 2017-11-09 ENCOUNTER — Encounter (HOSPITAL_COMMUNITY): Payer: Self-pay | Admitting: Licensed Clinical Social Worker

## 2017-11-09 DIAGNOSIS — F332 Major depressive disorder, recurrent severe without psychotic features: Secondary | ICD-10-CM | POA: Diagnosis not present

## 2017-11-09 NOTE — Progress Notes (Signed)
   THERAPIST PROGRESS NOTE  Session Time: 10:10-11am  Participation Level: Active  Behavioral Response: CasualAlertAnxious  Type of Therapy: Individual Therapy  Treatment Goals addressed: Coping  Interventions: CBT  Summary: Ashley [name] is a 42 y.o. female who presents for her  individual counseling session. Pt discussed her psychiatric symptoms and current life events. Pt still continues to have tightness in her chest due to the stress of her marital issues.Reminded pt of self soothing skills useful for her her anxiety. Practiced breathing exercise: mindfulness of the breath. Pt talked a lot of her marriage and its effects on her and the children. Pt is still trying to decide what is best. Gave pt names of marriage counselors. Pt is struggling to remain in the home. Pt and husband have discussed options but pt has a lot of fear of being able to maintain her lifestyle monetarily. Processed this with pt and had pt use imagery of what her life may look like in a separation. Pt still struggles with her anxiety.        Suicidal/Homicidal: Nowithout intent/plan  Therapist Response: Assessed pt's current functioning by self report and reviewed progress. Assisted pt processing her maritial decision, mindfulness, breathing exercises, imagery, self soothing, Assisted pt processing for the management of her stressors.  Plan: Return again in 2 weeks.  Diagnosis: Axis I: Anxiety and depression        Michalle Rademaker S, LCAS 11/09/2017

## 2017-11-10 ENCOUNTER — Ambulatory Visit (INDEPENDENT_AMBULATORY_CARE_PROVIDER_SITE_OTHER): Payer: BLUE CROSS/BLUE SHIELD | Admitting: Licensed Clinical Social Worker

## 2017-11-10 DIAGNOSIS — F332 Major depressive disorder, recurrent severe without psychotic features: Secondary | ICD-10-CM

## 2017-11-11 ENCOUNTER — Encounter (HOSPITAL_COMMUNITY): Payer: Self-pay | Admitting: Licensed Clinical Social Worker

## 2017-11-11 NOTE — Progress Notes (Signed)
Daily Group Progress Note             Program: Outpatient Evening Group   Group Time: 5:30-6:30pm  Participation Level: Active  Behavioral Response: Appropriate  Type of Therapy:  Psychoeducation/Group process  Summary of Progress: Pt participated in an introductory group. Today was the first day of the OP Evening Group. Pt shared what brought her to therapy and how she wants to learn effective coping tools for her depression. Pt actively participated.      Jenkins Rouge, LCAS

## 2017-11-16 ENCOUNTER — Encounter (HOSPITAL_COMMUNITY): Payer: Self-pay | Admitting: Licensed Clinical Social Worker

## 2017-11-16 ENCOUNTER — Ambulatory Visit (INDEPENDENT_AMBULATORY_CARE_PROVIDER_SITE_OTHER): Payer: PPO | Admitting: Licensed Clinical Social Worker

## 2017-11-16 DIAGNOSIS — F332 Major depressive disorder, recurrent severe without psychotic features: Secondary | ICD-10-CM | POA: Diagnosis not present

## 2017-11-16 NOTE — Progress Notes (Signed)
   THERAPIST PROGRESS NOTE  Session Time: 10:10-11am  Participation Level: Active  Behavioral Response: CasualAlertAnxious  Type of Therapy: Individual Therapy  Treatment Goals addressed: Coping  Interventions: CBT  Summary: Ashley [name] is a 42 y.o. female who presents for her  individual counseling session. Pt discussed her psychiatric symptoms and current life events. Pt still continues to have tightness in her chest due to the stress of her marital issues. Showed pt a guided meditation to use for self soothing. Again, practiced breathing exercise: mindfulness of the breath. Pt and her husband are going to marriage counseling beginning this week, as they have made a decision to work on their marriage. Validated pt on her decision. Pt reports she has lost trust in her husband and would like for them to work on honesty in the marriage. Gave pt handouts on regaining trust in a marriage.     Suicidal/Homicidal: Nowithout intent/plan  Therapist Response: Assessed pt's current functioning by self report and reviewed progress. Assisted pt processing her maritial decision,  breathing exercises,, self soothing, validation, trust handouts. Assisted pt processing for the management of her stressors.  Plan: Return again in 2 weeks.  Diagnosis: Axis I: Anxiety and depression        Arleth Mccullar S, LCAS 11/16/2017

## 2017-11-17 ENCOUNTER — Ambulatory Visit (INDEPENDENT_AMBULATORY_CARE_PROVIDER_SITE_OTHER): Payer: PPO | Admitting: Licensed Clinical Social Worker

## 2017-11-17 DIAGNOSIS — F332 Major depressive disorder, recurrent severe without psychotic features: Secondary | ICD-10-CM | POA: Diagnosis not present

## 2017-11-18 ENCOUNTER — Encounter (HOSPITAL_COMMUNITY): Payer: Self-pay | Admitting: Licensed Clinical Social Worker

## 2017-11-18 NOTE — Progress Notes (Signed)
Daily Group Progress Note             Program: Outpatient Evening Group   Group Time: 5:30-6:30pm  Participation Level: Active  Behavioral Response: Appropriate  Type of Therapy:  Psychoeducation/Group process   Summary of Progress: Pt participated in a discussion about coping skills for anxiety and depression. Pt shared with the group the coping skills that work for her:  Breathing, meditation, which gave suggestions to other group members. Pt was encouraged to continue using her coping skills. Pt was receptive to suggestions and intervention.   Ashley [name], LCAS

## 2017-11-24 ENCOUNTER — Ambulatory Visit (HOSPITAL_COMMUNITY): Payer: Self-pay | Admitting: Licensed Clinical Social Worker

## 2017-11-26 ENCOUNTER — Encounter (HOSPITAL_COMMUNITY): Payer: Self-pay | Admitting: Licensed Clinical Social Worker

## 2017-11-26 ENCOUNTER — Ambulatory Visit (INDEPENDENT_AMBULATORY_CARE_PROVIDER_SITE_OTHER): Payer: PPO | Admitting: Licensed Clinical Social Worker

## 2017-11-26 DIAGNOSIS — Z630 Problems in relationship with spouse or partner: Secondary | ICD-10-CM | POA: Diagnosis not present

## 2017-11-26 DIAGNOSIS — F419 Anxiety disorder, unspecified: Secondary | ICD-10-CM | POA: Diagnosis not present

## 2017-11-26 DIAGNOSIS — F329 Major depressive disorder, single episode, unspecified: Secondary | ICD-10-CM | POA: Diagnosis not present

## 2017-11-26 DIAGNOSIS — F332 Major depressive disorder, recurrent severe without psychotic features: Secondary | ICD-10-CM

## 2017-11-26 NOTE — Progress Notes (Signed)
   THERAPIST PROGRESS NOTE  Session Time: 4:10-5pm  Participation Level: Active  Behavioral Response: CasualAlertAnxious  Type of Therapy: Individual Therapy  Treatment Goals addressed: Coping  Interventions: CBT  Summary: Ashley [name] is a 42 y.o. female who presents for her  individual counseling session. Pt discussed her psychiatric symptoms and current life events.pt and her husband are going to marriage counseling. Pt is trying hard to move forward with her marriage. Asked open ended questions and used emotional reflection. Pt and clinician discussed healthy coping skills to deal with her anxious moments. Discussed basic CBT  Concepts with pt.       Suicidal/Homicidal: Nowithout intent/plan  Therapist Response: Assessed pt's current functioning by self report and reviewed progress. Assisted pt processing her maritial decision, self soothing, coping tools. handouts. Assisted pt processing for the management of her stressors.  Plan: Return again in 2 weeks.  Diagnosis: Axis I: Anxiety and depression        [name],[name], LCAS 11/26/2017

## 2017-12-01 ENCOUNTER — Ambulatory Visit (HOSPITAL_COMMUNITY): Payer: Self-pay | Admitting: Licensed Clinical Social Worker

## 2017-12-08 ENCOUNTER — Ambulatory Visit (HOSPITAL_COMMUNITY): Payer: Self-pay | Admitting: Licensed Clinical Social Worker

## 2017-12-08 ENCOUNTER — Ambulatory Visit (INDEPENDENT_AMBULATORY_CARE_PROVIDER_SITE_OTHER): Payer: PPO | Admitting: Licensed Clinical Social Worker

## 2017-12-08 DIAGNOSIS — F332 Major depressive disorder, recurrent severe without psychotic features: Secondary | ICD-10-CM

## 2017-12-09 ENCOUNTER — Encounter (HOSPITAL_COMMUNITY): Payer: Self-pay | Admitting: Licensed Clinical Social Worker

## 2017-12-09 NOTE — Progress Notes (Signed)
Daily Group Progress Note       Program: OP   Group Time: 5:30-6:30  Participation Level: Active  Behavioral Response: Appropriate  Type of Therapy:  Psychoeducation/Therapy  Summary of Progress: Pt participated in a discussion on Introduction to Mindfulness. Pt defined mindfulness and explained the benefits. Pt participated in "Mindfulness of the Breath" skill training.    Jenkins Rouge, LCAS

## 2017-12-15 ENCOUNTER — Ambulatory Visit (INDEPENDENT_AMBULATORY_CARE_PROVIDER_SITE_OTHER): Payer: PPO | Admitting: Licensed Clinical Social Worker

## 2017-12-15 DIAGNOSIS — F332 Major depressive disorder, recurrent severe without psychotic features: Secondary | ICD-10-CM

## 2017-12-16 ENCOUNTER — Encounter (HOSPITAL_COMMUNITY): Payer: Self-pay | Admitting: Licensed Clinical Social Worker

## 2017-12-16 ENCOUNTER — Ambulatory Visit: Payer: Self-pay | Admitting: Neurology

## 2017-12-16 ENCOUNTER — Ambulatory Visit (INDEPENDENT_AMBULATORY_CARE_PROVIDER_SITE_OTHER): Payer: PPO | Admitting: Licensed Clinical Social Worker

## 2017-12-16 DIAGNOSIS — F332 Major depressive disorder, recurrent severe without psychotic features: Secondary | ICD-10-CM | POA: Diagnosis not present

## 2017-12-16 NOTE — Progress Notes (Signed)
Daily Group Progress Note    Program: OP   Group Time: 5:30-6:30  Participation Level: Active  Behavioral Response: Appropriate  Type of Therapy:  Psychoeducation/Therapy  Summary of Progress: Pt participated in a discussion on the series on Mindfulness Therapy and the benefit of having a daily mindfulness practice. Pt received a handout on mindfulness. Pt participated in a mindfulness skill training.   Jenkins Rouge, LCAS

## 2017-12-16 NOTE — Progress Notes (Signed)
   THERAPIST PROGRESS NOTE  Session Time: 11:10-12:00pm  Participation Level: Active  Behavioral Response: CasualAlertAnxious  Type of Therapy: Individual Therapy  Treatment Goals addressed: Coping  Interventions: CBT  Summary: Ashley [name] is a 42 y.o. female who presents for her  individual counseling session. Pt discussed her psychiatric symptoms and current life events. Pt reports she is sad and depressed and confused. She does not know how to move forward in her marriage. Pt was tearful in her discussion. Pt was able to identify pros and cons of staying in the marriage. Pt feels guilty about choices she made in getting married. Pt continues to be hard on herself. Taught pt"check the facts."    Suicidal/Homicidal: Nowithout intent/plan  Therapist Response: Assessed pt's current functioning by self report and reviewed progress. Assisted pt processing her maritial decision, "check the facts:. Assisted pt processing for the management of her stressors.  Plan: Return again in 2 weeks.  Diagnosis: Axis I: Anxiety and depression        [name],[name], LCAS 12/16/2017

## 2017-12-21 ENCOUNTER — Ambulatory Visit (INDEPENDENT_AMBULATORY_CARE_PROVIDER_SITE_OTHER): Payer: PPO | Admitting: Psychiatry

## 2017-12-21 ENCOUNTER — Encounter (HOSPITAL_COMMUNITY): Payer: Self-pay | Admitting: Psychiatry

## 2017-12-21 DIAGNOSIS — Z79899 Other long term (current) drug therapy: Secondary | ICD-10-CM

## 2017-12-21 DIAGNOSIS — F3341 Major depressive disorder, recurrent, in partial remission: Secondary | ICD-10-CM | POA: Diagnosis not present

## 2017-12-21 MED ORDER — MIRTAZAPINE 30 MG PO TABS: 30.0000 mg | ORAL_TABLET | Freq: Every day | ORAL | 1 refills | Status: DC

## 2017-12-21 MED ORDER — FLUOXETINE HCL 40 MG PO CAPS: 40.0000 mg | ORAL_CAPSULE | Freq: Every day | ORAL | 1 refills | Status: DC

## 2017-12-21 MED ORDER — FLUOXETINE HCL 20 MG PO CAPS: 20.0000 mg | ORAL_CAPSULE | Freq: Every day | ORAL | 1 refills | Status: DC

## 2017-12-21 NOTE — Progress Notes (Signed)
[facility] MD/PA/NP OP Progress Note  12/21/2017 11:50 AM Ashley [name]  MRN:  [id number]  Chief Complaint: Med check  HPI: Ashley [name] reports that her anxiety and depression symptoms have been generally okay, dealing with some stressors related to trying to figure out her son's new schooling placement.  Spent time with her discussing some of her feeling of her son being stuck in the middle between her and her ex-husband as they try to figure this out.  Spent some time strategizing how they can help their son who is going to the eighth grade be a more active participant in making this decision and not being stuck in the middle.  Spent time discussing her mood symptoms, sleep, and ongoing stressors with her current husband.  We discussed increasing Prozac to 60 mg daily, 80 mg daily caused some lightheadedness so she had backed off to 40 mg.  She denies any acute safety issues, and continues to participate well in individual and group therapies.  Visit Diagnosis:    ICD-10-CM   1. Recurrent major depressive disorder, in partial remission (HCC) F33.41 mirtazapine (REMERON) 30 MG tablet    FLUoxetine (PROZAC) 40 MG capsule    FLUoxetine (PROZAC) 20 MG capsule    Past Psychiatric History: See intake H&P for full details. Reviewed, with no updates at this time.   Past Medical History:  Past Medical History:  Diagnosis Date  . Anxiety   . Depression   . ICH (intracerebral hemorrhage) ([place])   . Seizures ([place])   . Stroke [facility])     Past Surgical History:  Procedure Laterality Date  . DILATION AND CURETTAGE OF UTERUS      Family Psychiatric History: See intake H&P for full details. Reviewed, with no updates at this time.   Family History:  Family History  Problem Relation Age of Onset  . Cancer Mother   . Cancer Father        pancreatic    Social History:  Social History   Socioeconomic History  . Marital status: Married    Spouse name: None  . Number of children: None  . Years  of education: None  . Highest education level: None  Social Needs  . Financial resource strain: None  . Food insecurity - worry: None  . Food insecurity - inability: None  . Transportation needs - medical: None  . Transportation needs - non-medical: None  Occupational History  . None  Tobacco Use  . Smoking status: Never Smoker  . Smokeless tobacco: Never Used  Substance and Sexual Activity  . Alcohol use: Yes    Alcohol/week: 0.6 oz    Types: 1 Glasses of wine per week    Comment: casual   . Drug use: No  . Sexual activity: Yes    Birth control/protection: None    Comment: husband has vasectomy  Other Topics Concern  . None  Social History Narrative  . None    Allergies: No Known Allergies  Metabolic Disorder Labs: Lab Results  Component Value Date   HGBA1C 5.1 02/07/2016   No results found for: PROLACTIN Lab Results  Component Value Date   CHOL 194 02/07/2016   TRIG 45 02/07/2016   HDL 90 02/07/2016   CHOLHDL 2.2 02/07/2016   LDLCALC 95 02/07/2016   No results found for: TSH  Therapeutic Level Labs: No results found for: LITHIUM Lab Results  Component Value Date   VALPROATE <10 (L) 06/01/2015   No components found for:  CBMZ  Current  Medications: Current Outpatient Medications  Medication Sig Dispense Refill  . aspirin 81 MG chewable tablet Chew 81 mg by mouth daily.    . Cholecalciferol (VITAMIN D) 2000 units CAPS Take 2,000 Units by mouth daily.    Marland Kitchen FLUoxetine (PROZAC) 40 MG capsule Take 1 capsule (40 mg total) by mouth daily. Take with 20 mg for total of 60 mg daily 90 capsule 1  . levETIRAcetam (KEPPRA) 750 MG tablet Take 2 tablets (1,500 mg total) by mouth 2 (two) times daily. 120 tablet 11  . LORazepam (ATIVAN) 1 MG tablet Take 1 tablet as needed for seizure 10 tablet 3  . mirtazapine (REMERON) 30 MG tablet Take 1 tablet (30 mg total) by mouth at bedtime. 90 tablet 1  . Multiple Vitamin (MULTIVITAMIN) tablet Take 1 tablet by mouth daily.     .  Oxcarbazepine (TRILEPTAL) 300 MG tablet Take 1/2 tablet twice a day for a week, then 1 tablet twice a day for a week, then 2 tablets twice a day 120 tablet 3  . FLUoxetine (PROZAC) 20 MG capsule Take 1 capsule (20 mg total) by mouth daily. Take with 40 mg for total of 60 mg daily 90 capsule 1   No current facility-administered medications for this visit.    Facility-Administered Medications Ordered in Other Visits  Medication Dose Route Frequency Provider Last Rate Last Dose  . gadopentetate dimeglumine (MAGNEVIST) injection 12 mL  12 mL Intravenous Once PRN Garvin Fila, MD         Musculoskeletal: Strength & Muscle Tone: within normal limits and abnormal Gait & Station: unsteady Patient leans: Left  Psychiatric Specialty Exam: ROS  Blood pressure 104/70, pulse 80, height 5\' 3"  (1.6 m), weight 132 lb (59.9 kg), SpO2 96 %.Body mass index is 23.38 kg/m.  General Appearance: Casual and Well Groomed  Eye Contact:  Good  Speech:  Clear and Coherent and Normal Rate  Volume:  Normal  Mood:  Anxious and Euthymic  Affect:  Appropriate and Congruent  Thought Process:  Goal Directed and Descriptions of Associations: Intact  Orientation:  Full (Time, Place, and Person)  Thought Content: Logical   Suicidal Thoughts:  No  Homicidal Thoughts:  No  Memory:  Immediate;   Good  Judgement:  Good  Insight:  Good  Psychomotor Activity:  Normal  Concentration:  Attention Span: Good  Recall:  Good  Fund of Knowledge: Good  Language: Good  Akathisia:  Negative  Handed:  Right  AIMS (if indicated): not done  Assets:  Communication Skills Desire for Improvement Financial Resources/Insurance Housing Transportation  ADL's:  Intact  Cognition: WNL  Sleep:  Good   Screenings: PHQ2-9     [facility]  PHQ-2 Total  Score  2  6  2   PHQ-9 Total Score  No data  No data  8       Assessment and Plan:  [name] presents for medication management follow-up, and has been generally stable on Prozac and Remeron.  She continues to work in individual therapy and group therapies.  We will proceed as below and follow-up in 3 months.  1. Recurrent major depressive disorder, in partial remission ([place])     Status of current problems: gradually improving  Labs Ordered: No orders of the defined types were placed in this encounter.   Labs Reviewed: N/A  Collateral  Obtained/Records Reviewed: N/A  Plan:  Prozac 80 mg not tolerated due to dizziness, we agreed to maintain at 60 mg daily Remeron 30 mg nightly Return to clinic in 3 months To new group and individual therapy  I spent 15 minutes with the patient in direct face-to-face clinical care.  Greater than 50% of this time was spent in counseling and coordination of care with the patient.    Aundra Dubin, MD 12/21/2017, 11:49 AM

## 2017-12-22 ENCOUNTER — Encounter (HOSPITAL_COMMUNITY): Payer: Self-pay | Admitting: Licensed Clinical Social Worker

## 2017-12-22 ENCOUNTER — Ambulatory Visit (INDEPENDENT_AMBULATORY_CARE_PROVIDER_SITE_OTHER): Payer: PPO | Admitting: Licensed Clinical Social Worker

## 2017-12-22 ENCOUNTER — Ambulatory Visit (HOSPITAL_COMMUNITY): Payer: Self-pay | Admitting: Licensed Clinical Social Worker

## 2017-12-22 DIAGNOSIS — F332 Major depressive disorder, recurrent severe without psychotic features: Secondary | ICD-10-CM

## 2017-12-22 NOTE — Progress Notes (Signed)
   THERAPIST PROGRESS NOTE  Session Time: 11:10-12:00pm  Participation Level: Active  Behavioral Response: CasualAlertAnxious  Type of Therapy: Individual Therapy  Treatment Goals addressed: Coping  Interventions: CBT  Summary: Ashley [name] is a 42 y.o. female who presents for her  individual counseling session. Pt discussed her psychiatric symptoms and current life events. Pt reports less depressed and less anxious. Pt reports she has made a decision to try and stay in her marriage for now. Processed with pt what she is willing to do different: try to forgive the past, make another appt for marriage counseling, be less combative in conversation, work towards common goals and building trust within the marriage. Asked pt open ended questions about her goals/strategies she will use to move forward. Incorporated during discussion  "check the facts." while moving forward.      Suicidal/Homicidal: Nowithout intent/plan  Therapist Response: Assessed pt's current functioning by self report and reviewed progress. Assisted pt processing her maritial decision, "check the facts, goals &strat4egies for moving forward in the marriage. Assisted pt processing for the management of her stressors.  Plan: Return again in 2 weeks.  Diagnosis: Axis I: Anxiety and depression        [name],[name], LCAS 12/22/2017

## 2017-12-29 ENCOUNTER — Ambulatory Visit (INDEPENDENT_AMBULATORY_CARE_PROVIDER_SITE_OTHER): Payer: PPO | Admitting: Licensed Clinical Social Worker

## 2017-12-29 ENCOUNTER — Ambulatory Visit (HOSPITAL_COMMUNITY): Payer: Self-pay | Admitting: Licensed Clinical Social Worker

## 2017-12-29 ENCOUNTER — Encounter (HOSPITAL_COMMUNITY): Payer: Self-pay | Admitting: Licensed Clinical Social Worker

## 2017-12-29 DIAGNOSIS — F332 Major depressive disorder, recurrent severe without psychotic features: Secondary | ICD-10-CM | POA: Diagnosis not present

## 2017-12-29 NOTE — Progress Notes (Signed)
   THERAPIST PROGRESS NOTE  Session Time: 11:10-12:00pm  Participation Level: Active  Behavioral Response: CasualAlertAnxious  Type of Therapy: Individual Therapy  Treatment Goals addressed: Coping  Interventions: CBT  Summary: Ashley [name] is a 42 y.o. female who presents for her  individual counseling session. Pt discussed her psychiatric symptoms and current life events. Pt reports less depressed and less anxious.She has moved into acceptance. Accepting that she is going to stay in the marriage. Pt talked of the pros/cons of the marriage. Re-emphasized letting go of the past. Assisted pt making a list of her "wants and needs" in the marriage. Suggested she talk with her husband and go over the list with him. Worked with pt on "letting go" of old hurts. Pt reports when she talks to her husband or about him she gets pains in her chest. Pt participated in a breathing exercise in session.        Suicidal/Homicidal: Nowithout intent/plan  Therapist Response: Assessed pt's current functioning by self report and reviewed progress. Assisted pt processing her maritial decision, letting go, goals & strategies for moving forward in the marriage. Assisted pt processing for the management of her stressors.  Plan: Return again in 2 weeks.  Diagnosis: Axis I: Anxiety and depression        Camron Essman S, LCAS 12/29/2017

## 2018-01-05 ENCOUNTER — Ambulatory Visit (INDEPENDENT_AMBULATORY_CARE_PROVIDER_SITE_OTHER): Payer: PPO | Admitting: Licensed Clinical Social Worker

## 2018-01-05 ENCOUNTER — Encounter (HOSPITAL_COMMUNITY): Payer: Self-pay | Admitting: Licensed Clinical Social Worker

## 2018-01-05 DIAGNOSIS — F332 Major depressive disorder, recurrent severe without psychotic features: Secondary | ICD-10-CM | POA: Diagnosis not present
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
## 2018-01-06 ENCOUNTER — Encounter (HOSPITAL_COMMUNITY): Payer: Self-pay | Admitting: Licensed Clinical Social Worker

## 2018-01-06 NOTE — Progress Notes (Signed)
Daily Group Progress Note    Program: OP Evening Group   Group Time: 5:30-6:30  Participation Level: Active  Behavioral Response: Appropriate  Type of Therapy:  Psychoeducation/Therapy  Summary of Progress:  Pt participated in a discussion about coping skills for anxiety and depression. Pt shared with the group the coping skills that work for her, which gave suggestions to other group members. Pt participated in an activity, drawing "my safe place." Pt was encouraged to continue using her coping skills. Pt was receptive to suggestions and intervention.  Alver Fisher, LCAS

## 2018-01-08 ENCOUNTER — Ambulatory Visit: Payer: PPO | Admitting: Neurology

## 2018-01-08 ENCOUNTER — Encounter: Payer: Self-pay | Admitting: Neurology

## 2018-01-08 VITALS — BP 90/50 | HR 79 | Resp 16 | Ht 63.0 in | Wt 128.6 lb

## 2018-01-08 DIAGNOSIS — I69359 Hemiplegia and hemiparesis following cerebral infarction affecting unspecified side: Secondary | ICD-10-CM

## 2018-01-08 DIAGNOSIS — G40109 Localization-related (focal) (partial) symptomatic epilepsy and epileptic syndromes with simple partial seizures, not intractable, without status epilepticus: Secondary | ICD-10-CM | POA: Diagnosis not present

## 2018-01-08 MED ORDER — LEVETIRACETAM 750 MG PO TABS: 1500.0000 mg | ORAL_TABLET | Freq: Two times a day (BID) | ORAL | 11 refills | Status: DC

## 2018-01-08 MED ORDER — OXCARBAZEPINE 300 MG PO TABS: ORAL_TABLET | ORAL | 6 refills | Status: DC

## 2018-01-08 NOTE — Patient Instructions (Addendum)
Great seeing you! Continue Trileptal 300mg : take 2 tabs twice a day and Keppra 750mg : take 2 tabs twice a day. Follow-up in 6 months, call for any changes.  Seizure Precautions: 1. If medication has been prescribed for you to prevent seizures, take it exactly as directed.  Do not stop taking the medicine without talking to your doctor first, even if you have not had a seizure in a long time.   2. Avoid activities in which a seizure would cause danger to yourself or to others.  Don't operate dangerous machinery, swim alone, or climb in high or dangerous places, such as on ladders, roofs, or girders.  Do not drive unless your doctor says you may.  3. If you have any warning that you may have a seizure, lay down in a safe place where you can't hurt yourself.    4.  No driving for 6 months from last seizure, as per [facility].   Please refer to the following link on the [place] website for more information: http://www.epilepsyfoundation.org/answerplace/Social/driving/drivingu.cfm   5.  Maintain good sleep hygiene. Avoid alcohol.  6.  Notify your neurology if you are planning pregnancy or if you become pregnant.  7.  Contact your doctor if you have any problems that may be related to the medicine you are taking.  8.  Call 911 and bring the patient back to the ED if:        A.  The seizure lasts longer than 5 minutes.       B.  The patient doesn't awaken shortly after the seizure  C.  The patient has new problems such as difficulty seeing, speaking or moving  D.  The patient was injured during the seizure  E.  The patient has a temperature over 102 F (39C)  F.  The patient vomited and now is having trouble breathing

## 2018-01-08 NOTE — Progress Notes (Signed)
NEUROLOGY FOLLOW UP OFFICE NOTE  Ashley [name] [id number] 1976/08/08  HISTORY OF PRESENT ILLNESS: I had the pleasure of seeing Ashley [name] in follow-up in the neurology clinic on 01/08/2018. The patient was last seen 2 months ago for seizures. She is again accompanied by her husband who helps supplement the history today. On her last visit, she presented for an ER visit on 08/21/17 for trouble speaking, confusion, right arm and leg numbness and weakness. She feels this was a panic attack, she was driving home from a meeting where she had been upset, then she suddenly did not know what to do and started shaking. She called her husband and could talk, but could not tell anybody where she was. She pulled over and knew something was going on, her husband reports she was saying things "off the wall," such as saying she "just passed daddy's room" when he asked where she was. She feels right arm tingling lasted 10 minutes. In the ER she had mild right-sided weakness but symptoms were improving, then resolved in a few hours. She was Keppra 1500mg  BID and Vimpat 200mg  BID. Vimpat had become cost-prohibitive and discussed switching to oxcarbazepine. She has tapered off Vimpat and is currently on oxcarbazepine 600mg  BID and Keppra 1500mg  BID without side effects. She feels better on the oxcarbazepine. She denies any further episodes of trouble speaking, confusion, worsened right sided weakness. She was also reporting brief episodes where she could not hear, these have not occurred recently as well. She denies any dizziness, diplopia, headaches, focal numbness/tingling. She has had a few falls, which is not unusual for her per patient. She has been a little more irritable and anxious, and is now on a higher dose of Prozac 60mg  daily.   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 She had  focal seizures in 2016 with EEG showing left PLEDs and has been on Keppra 1500mg  BID since then with no further focal motor seizures, however she would have occasional episodes around once a month where she could not get her words out, lasting 3-4 minutes. Sometimes she would get hot and have to sit but her speech would not be affected. On 11/27/16, she started having the same speech difficulties where she was making paraphasic errors, but it continued for 30 minutes and they decided to go to the ER. In the ER, she was noted to be aphasic, then as she was having a head CT, she suddenly had right gaze and head deviation, right arm and leg flexion and shaking for around 3 minutes. She was given IV Ativan but her right leg continued to shake in a rhythmic fashion. She was given additional Keppra and started on Vimpat with no further seizures, back to baseline in a couple of hours. She initially had some difficulties with the Vimpat 100mg  BID where she had hallucinations of hearing music or voices, but these have resolved over the past week. She denies any side effects on Keppra 1500mg  BID. She denies any dizziness, diplopia, gait instability. She has a right foot drop, denies any numbness/tingling on the right side but states it feels different that the other side. She denies any olfactory/gustatory hallucinations, myoclonic jerks, episodes of staring/unresponsiveness, gaps in time. She has not had any further episodes of paraphasic errors since her hospitalization. She had a mild diffuse headache for a couple of days  but this has resolved. She denies any dizziness, dysarthria/dysphagia, neck/back pain, bowel/bladder dysfunction.  Records from her hospitalization in August 2016 were reviewed. She has been seeing neurologist Dr. Leonie Man for post-stroke and seizure care since then, last visit was in August 2017. She had a severe headache for several days in August 2016 2 weeks post-partum. She then developed right-sided  weakness, aphasia, then a focal seizure in the hospital. She was found to have a large left parietal ICH with IVH, cerebral edema, obstructive hydrocephalus and subarachnoid hemorrhage. MRA and MRV were unremarkable. Concern was for cerebral cortical vein thrombosis not seen on MRV, however RCVS was also a consideration. She was treated with hypertonic saline and stabilized. She had a seizure with EEG showing left PLEDs. She was started on Keppra. Hypercoagulable labs were normal. She was discharged to rehab and had another seizure on 06/01/16 with report of eyes rolling back and upper extremities shaking. Keppra dose was increased. She had a repeat MRI brain in September 2016 with resolving subacute left parietal hematoma. There was acute gyral swelling and T2 hyperintensity in the left hippocampal formation, left insula, and operculum, that resolved with repeat MRI 09/2015 showing resolution of these changes. She was initially having some headaches and low dose Topamax was briefly added, then with resolution of headaches, this was tapered off. She had been dealing with post-stroke depression and symptoms had improved, however with limitations on driving again, she is starting to feel depressed again. She has three young children, the youngest is 21 months old. She was not happy with neuro rehab and has a private physical therapist coming weekly where she pushes herself and has made a ton of improvement. She does not like to wear her AFO and her husband did not notice any difference in gait with the AFO.   Epilepsy Risk Factors:  History of large left parietal hematoma with encephalomalacia. Otherwise she had a normal birth and early development.  There is no history of febrile convulsions, CNS infections such as meningitis/encephalitis, significant traumatic brain injury, neurosurgical procedures, or family history of seizures.  Diagnostic Data: I personally reviewed MRI brain 11/26/2016 which did not show any  acute changes. There was left paramedian parietal encephalomalacia with extensive hemosiderin staining extending into the splenium of the corpus callosum, ex vacuo dilatation of the left lateral ventricle occipital horn, increased side of the temporal horn of the left lateral ventricle which may be due to decreased volume of the hippocampus. It is smaller in size with increased signal compared to the right. EEG 11/27/16 showed mild left hemispheric slowing EEG 05/22/15: left hemisphere slowing, rare epileptiform discharges over the left anterior temporal region EEG 05/21/15: PLEDs on the left hemisphere, mainly involving the temporo-parietal region  Prior AEDs: Topamax PAST MEDICAL HISTORY: Past Medical History:  Diagnosis Date  . Anxiety   . Depression   . ICH (intracerebral hemorrhage) ([place])   . Seizures ([place])   . Stroke [facility])     MEDICATIONS: Current Outpatient Medications on File Prior to Visit  Medication Sig Dispense Refill  . aspirin 81 MG chewable tablet Chew 81 mg by mouth daily.    . Cholecalciferol (VITAMIN D) 2000 units CAPS Take 2,000 Units by mouth daily.    Marland Kitchen FLUoxetine (PROZAC) 20 MG capsule Take 1 capsule (20 mg total) by mouth daily. Take with 40 mg for total of 60 mg daily 90 capsule 1  . FLUoxetine (PROZAC) 40 MG capsule Take by mouth.    . levETIRAcetam (KEPPRA) 750  MG tablet Take 2 tablets (1,500 mg total) by mouth 2 (two) times daily. 120 tablet 11  . LORazepam (ATIVAN) 1 MG tablet Take 1 tablet as needed for seizure 10 tablet 3  . mirtazapine (REMERON) 30 MG tablet Take 1 tablet (30 mg total) by mouth at bedtime. 90 tablet 1  . Multiple Vitamin (MULTIVITAMIN) tablet Take 1 tablet by mouth daily.     . Oxcarbazepine (TRILEPTAL) 300 MG tablet Take 1/2 tablet twice a day for a week, then 1 tablet twice a day for a week, then 2 tablets twice a day 120 tablet 3  . Sulfamethoxazole-Trimethoprim (BACTRIM PO) Take 160 mg by mouth daily.     Current Facility-Administered  Medications on File Prior to Visit  Medication Dose Route Frequency Provider Last Rate Last Dose  . gadopentetate dimeglumine (MAGNEVIST) injection 12 mL  12 mL Intravenous Once PRN Garvin Fila, MD        ALLERGIES: No Known Allergies  FAMILY HISTORY: Family History  Problem Relation Age of Onset  . Cancer Mother   . Cancer Father        pancreatic    SOCIAL HISTORY: Social History   Socioeconomic History  . Marital status: Married    Spouse name: Not on file  . Number of children: Not on file  . Years of education: Not on file  . Highest education level: Not on file  Occupational History  . Not on file  Social Needs  . Financial resource strain: Not on file  . Food insecurity:    Worry: Not on file    Inability: Not on file  . Transportation needs:    Medical: Not on file    Non-medical: Not on file  Tobacco Use  . Smoking status: Never Smoker  . Smokeless tobacco: Never Used  Substance and Sexual Activity  . Alcohol use: Yes    Alcohol/week: 0.6 oz    Types: 1 Glasses of wine per week    Comment: casual   . Drug use: No  . Sexual activity: Yes    Birth control/protection: None    Comment: husband has vasectomy  Lifestyle  . Physical activity:    Days per week: Not on file    Minutes per session: Not on file  . Stress: Not on file  Relationships  . Social connections:    Talks on phone: Not on file    Gets together: Not on file    Attends religious service: Not on file    Active member of club or organization: Not on file    Attends meetings of clubs or organizations: Not on file    Relationship status: Not on file  . Intimate partner violence:    Fear of current or ex partner: Not on file    Emotionally abused: Not on file    Physically abused: Not on file    Forced sexual activity: Not on file  Other Topics Concern  . Not on file  Social History Narrative  . Not on file    REVIEW OF SYSTEMS: Constitutional: No fevers, chills, or sweats,  no generalized fatigue, change in appetite Eyes: No visual changes, double vision, eye pain Ear, nose and throat: No hearing loss, ear pain, nasal congestion, sore throat Cardiovascular: No chest pain, palpitations Respiratory:  No shortness of breath at rest or with exertion, wheezes GastrointestinaI: No nausea, vomiting, diarrhea, abdominal pain, fecal incontinence Genitourinary:  No dysuria, urinary retention or frequency Musculoskeletal:  No neck pain,  back pain Integumentary: No rash, pruritus, skin lesions Neurological: as above Psychiatric: No depression, insomnia, anxiety Endocrine: No palpitations, fatigue, diaphoresis, mood swings, change in appetite, change in weight, increased thirst Hematologic/Lymphatic:  No anemia, purpura, petechiae. Allergic/Immunologic: no itchy/runny eyes, nasal congestion, recent allergic reactions, rashes  PHYSICAL EXAM: Vitals:   01/08/18 1131  BP: (!) 90/50  Pulse: 79  Resp: 16  SpO2: 100%   General: No acute distress Head:  Normocephalic/atraumatic Neck: supple, no paraspinal tenderness, full range of motion Heart:  Regular rate and rhythm Lungs:  Clear to auscultation bilaterally Back: No paraspinal tenderness Skin/Extremities: No rash, no edema Neurological Exam: alert and oriented to person, place, and time. No aphasia or dysarthria. Fund of knowledge is appropriate.  Recent and remote memory are intact.  Attention and concentration are normal.    Able to name objects and repeat phrases. are normal.    Able to name objects and repeat phrases. Cranial nerves: CN I: not tested CN II: pupils equal, round and reactive to light, visual fields intact, fundi unremarkable. CN III, IV, VI:  full range of motion, no nystagmus CN V: decreased on right V1-3 (unchanged) CN VII: upper and lower face symmetric CN VIII: hearing intact to finger rub CN IX, X: gag intact, uvula midline CN XI: sternocleidomastoid and trapezius muscles intact CN XII:  tongue midline Bulk & Tone: increased tone on right LE (more on right ankle), no fasciculations. Motor: 0/5 right foot dorsiflexion (not wearing her AFO today), eversion/inversion, 2/5 plantarflexion. Otherwise 5/5 throughout with no pronator drift.  Sensation: decreased on right UE and LE (unchanged) Deep Tendon Reflexes: brisk +3 on the right UE and LE, no clonus, +2 on left UE and LE Plantar responses: downgoing bilaterally Cerebellar: no incoordination on finger to nose testing Gait: spastic hemiparetic gait with foot circumduction (unchanged) Tremor: none  IMPRESSION: This is a pleasant 42 yo RH woman with a history of large left ICH and focal seizures in August 2016 with residual right foot drop. She was doing well initially on Keppra 1500mg  BID until 11/27/15 when she had a prolonged episode of paraphasic errors followed by a right-sided focal seizure. Vimpat was added. She was back in the ER last 08/21/17 for paraphasic errors and worsened right-sided weakness. She feels it may have been a panic attack, but since symptoms were similar to last seizure and there was some confusion/loss of awareness associated. She has switched off Vimpat due to cost, now on oxcarbazepine 600mg  BID and Keppra 1500mg  BID without seizures or side effects. We again discussed [place] driving laws to stop driving after a seizure until 6 months seizure-free. She will follow-up in 6 months and knows to call for any changes.  Thank you for allowing me to participate in her care.  Please do not hesitate to call for any questions or concerns.  The duration of this appointment visit was 15 minutes of face-to-face time with the patient.  Greater than 50% of this time was spent in counseling, explanation of diagnosis, planning of further management, and coordination of care.   Ellouise Newer, M.D.   CC: Dr. Brigitte Pulse

## 2018-01-12 ENCOUNTER — Ambulatory Visit (HOSPITAL_COMMUNITY): Payer: Self-pay | Admitting: Licensed Clinical Social Worker

## 2018-01-19 ENCOUNTER — Ambulatory Visit (INDEPENDENT_AMBULATORY_CARE_PROVIDER_SITE_OTHER): Payer: PPO | Admitting: Licensed Clinical Social Worker

## 2018-01-19 DIAGNOSIS — F332 Major depressive disorder, recurrent severe without psychotic features: Secondary | ICD-10-CM | POA: Diagnosis not present

## 2018-01-20 ENCOUNTER — Encounter (HOSPITAL_COMMUNITY): Payer: Self-pay | Admitting: Licensed Clinical Social Worker

## 2018-01-20 NOTE — Progress Notes (Signed)
Daily Group Progress Note    Program: OP Evening Group   Group Time: 5:30-6:30  Participation Level: Active  Behavioral Response: Appropriate  Type of Therapy:  Psychoeducation/Therapy  Summary of Progress:  Pt continued in a discussion on stress management, discovering not all stress is bad. Pt identified that stress response is a tool used to increase the odds of overcoming obstacles, before becoming too intense or problematic.   Jenkins Rouge, LCAS

## 2018-01-21 ENCOUNTER — Ambulatory Visit (INDEPENDENT_AMBULATORY_CARE_PROVIDER_SITE_OTHER): Payer: PPO | Admitting: Licensed Clinical Social Worker

## 2018-01-21 ENCOUNTER — Encounter (HOSPITAL_COMMUNITY): Payer: Self-pay | Admitting: Licensed Clinical Social Worker

## 2018-01-21 DIAGNOSIS — F332 Major depressive disorder, recurrent severe without psychotic features: Secondary | ICD-10-CM

## 2018-01-21 NOTE — Progress Notes (Signed)
   THERAPIST PROGRESS NOTE  Session Time: 9:10-10:00pm  Participation Level: Active  Behavioral Response: CasualAlert/Euthymic  Type of Therapy: Individual Therapy  Treatment Goals addressed: Coping  Interventions: CBT  Summary: Ashley [name] is a 42 y.o. female who presents for her  individual counseling session. Pt discussed her psychiatric symptoms and current life events. Pt reports much less stressed and anxious.She reports that she has been using her breathing exercises often to assist her with stressors. Pt and her current husband have been working on their relationship. She has let go of some of his past transgressions and feels better about the marriage. Asked open ended questions using empathic reflection. They are planning trips and family vacations. Pt reports she is busier than usual due to end of the year activities at her children's schools. Pt has taken some time for herself and is working more of her personal self care. Encouraged pt to continue to work on her self care. Her one current stressor is her x husband who has served papers on her. He wants their son to go to school in his district. Pt has hired an Government social research officer. Asked open ended questions. Encouraged pt to continue to use her mindfulness skills during times of stress.           Suicidal/Homicidal: Nowithout intent/plan  Therapist Response: Assessed pt's current functioning by self report and reviewed progress. Assisted pt processing importance of self care, less stress, working on marital issues, court case, mindfulness activities. Assisted pt processing for the management of her stressors.  Plan: Return again in 2 weeks.  Diagnosis: Axis I: Anxiety and depression        Leilah Polimeni S, LCAS 01/21/2018

## 2018-01-22 ENCOUNTER — Encounter: Payer: Self-pay | Admitting: Neurology

## 2018-01-26 ENCOUNTER — Ambulatory Visit (INDEPENDENT_AMBULATORY_CARE_PROVIDER_SITE_OTHER): Payer: PPO | Admitting: Licensed Clinical Social Worker

## 2018-01-26 ENCOUNTER — Encounter (HOSPITAL_COMMUNITY): Payer: Self-pay | Admitting: Licensed Clinical Social Worker

## 2018-01-26 ENCOUNTER — Ambulatory Visit (HOSPITAL_COMMUNITY): Payer: Self-pay | Admitting: Licensed Clinical Social Worker

## 2018-01-26 DIAGNOSIS — F332 Major depressive disorder, recurrent severe without psychotic features: Secondary | ICD-10-CM

## 2018-01-26 NOTE — Progress Notes (Signed)
   THERAPIST PROGRESS NOTE  Session Time: 2:10-3pm  Participation Level: Active  Behavioral Response: CasualAlert/Euthymic  Type of Therapy: Individual Therapy  Treatment Goals addressed: Coping  Interventions: CBT  Summary: Ashley [name] is a 42 y.o. female who presents for her  individual counseling session. Pt discussed her psychiatric symptoms and current life events. Pt reports continues to report less stress and anxiety.She reports that she continues to use her breathing exercises often to assist her with stressors. Pt's biggest stressor currently: her x husband is taking her to court. He wants their son to go to a different school that is close to his house. He's hired a Chief Executive Officer and now pt has hired a Chief Executive Officer. Her current husband is being supportive and assisting her. Asked open ended questions and used empathic reflection.  Pt is extremely busy this week because her family is flying to [place] for spring break next week. Pt is looking forward to spending quality time with all 3 of her children. This time spent she explains will be part of her self care. Continue to remind pt it's her responsibility to put herself first, attend to her self care so that she will be there for her children.Pt was aware of the suggestion.         Suicidal/Homicidal: Nowithout intent/plan  Therapist Response: Assessed pt's current functioning by self report and reviewed progress. Assisted pt processing importance of self care, less stress,  vacation, and court case. Assisted pt processing for the management of her stressors.  Plan: Return again in 2 weeks.  Diagnosis: Axis I: Anxiety and depression        Kai Calico S, LCAS 01/26/2018

## 2018-01-27 ENCOUNTER — Encounter (HOSPITAL_COMMUNITY): Payer: Self-pay | Admitting: Licensed Clinical Social Worker

## 2018-01-27 ENCOUNTER — Telehealth: Payer: Self-pay | Admitting: Neurology

## 2018-01-27 NOTE — Progress Notes (Signed)
Daily Group Progress Note    Program: OP Evening Group   Group Time: 5:30-6:30  Participation Level: Active  Behavioral Response: Appropriate  Type of Therapy:  Psychoeducation/Therapy  Summary of Progress:  Pt participated in a discussion on taking care of basic needs during high stress. Pt was able to identify areas she tends to neglect during stressful situations. During the discussion, Pt realized when basic needs are neglected mental well-being may deteriorate and which contributes to additional stress.  Ashley [name], LCAS

## 2018-01-27 NOTE — Telephone Encounter (Signed)
Please advise 

## 2018-01-27 NOTE — Telephone Encounter (Signed)
Patient called and said that her Therapist recommended that she   speak with Dr. Delice Lesch regarding her trip to  [facility]. She will be riding rides and they wanted to make sure with her Seizures that would be okay? Thanks

## 2018-01-28 NOTE — Telephone Encounter (Signed)
Spoke with pt relaying message below.   

## 2018-01-28 NOTE — Telephone Encounter (Signed)
I think it should be okay. If she starts feeling odd while on rides with bright flashing lights, just have her close her eyes for the duration of the lights. Would probably avoid high-speed roller coasters, but otherwise tell her to have fun! Thanks

## 2018-02-01 ENCOUNTER — Ambulatory Visit (HOSPITAL_COMMUNITY): Payer: Self-pay | Admitting: Licensed Clinical Social Worker

## 2018-02-02 ENCOUNTER — Ambulatory Visit (HOSPITAL_COMMUNITY): Payer: Self-pay | Admitting: Licensed Clinical Social Worker

## 2018-02-09 ENCOUNTER — Ambulatory Visit (INDEPENDENT_AMBULATORY_CARE_PROVIDER_SITE_OTHER): Payer: PPO | Admitting: Licensed Clinical Social Worker

## 2018-02-09 DIAGNOSIS — F332 Major depressive disorder, recurrent severe without psychotic features: Secondary | ICD-10-CM | POA: Diagnosis not present

## 2018-02-09 DIAGNOSIS — Z6379 Other stressful life events affecting family and household: Secondary | ICD-10-CM | POA: Diagnosis not present

## 2018-02-09 DIAGNOSIS — F329 Major depressive disorder, single episode, unspecified: Secondary | ICD-10-CM | POA: Diagnosis not present

## 2018-02-09 DIAGNOSIS — F419 Anxiety disorder, unspecified: Secondary | ICD-10-CM | POA: Diagnosis not present

## 2018-02-10 ENCOUNTER — Encounter (HOSPITAL_COMMUNITY): Payer: Self-pay | Admitting: Licensed Clinical Social Worker

## 2018-02-10 NOTE — Progress Notes (Signed)
Daily Group Progress Note  Program: OP Evening Group     Group Time: 5:30-6:30pm  Participation Level: Active  Behavioral Response: Appropriate  Type of Therapy:  Psychotherapy/Process   Summary of Progress:  Pt participated in a discussion on boundaries. Pt was encouraged to use non-permeable boundaries when dealing with problematic people, including family. Pt was receptive to the psychotherapy and suggestions on boundaries.      Alver Fisher, LCAS

## 2018-02-10 NOTE — Progress Notes (Signed)
   THERAPIST PROGRESS NOTE  Session Time: 1:10-2pm  Participation Level: Active  Behavioral Response: CasualAlert/Euthymic  Type of Therapy: Individual Therapy  Treatment Goals addressed: Coping  Interventions: CBT  Summary: Ashley [name] is a 42 y.o. female who presents for her  individual counseling session. Pt discussed her psychiatric symptoms and current life events. Pt continues to report less stress and anxiety.She reports that she continues to use her breathing exercises often to assist her with stressors. She and her husband and children just returned from [place] for spring trip. It was a wonderful vacation and good for family bonding. She is trying hard to move towards making her marriage stronger. Asked open ended questions. Pt's biggest stressor currently: her x husband is taking her to court. The next step is mediation. Pt voiced fears of her children having to go to a different school and the affects of change on the children. Discussed this with pt. Continue to remind pt it's her responsibility to put herself first, attend to her self care so that she will be there for her children.Pt was aware of the suggestion.         Suicidal/Homicidal: Nowithout intent/plan  Therapist Response: Assessed pt's current functioning by self report and reviewed progress. Assisted pt processing importance of self care, upcoming court case, marriage, x husband. Assisted pt processing for the management of her stressors.  Plan: Return again in 2 weeks for individua apppt and every week for OP group  Diagnosis: Axis I: Anxiety and depression        [place], LCAS 02/10/2018

## 2018-02-15 ENCOUNTER — Ambulatory Visit (HOSPITAL_COMMUNITY): Payer: PPO | Admitting: Licensed Clinical Social Worker

## 2018-02-15 ENCOUNTER — Encounter (HOSPITAL_COMMUNITY): Payer: Self-pay | Admitting: Licensed Clinical Social Worker

## 2018-02-15 DIAGNOSIS — F419 Anxiety disorder, unspecified: Secondary | ICD-10-CM | POA: Diagnosis not present

## 2018-02-15 NOTE — Progress Notes (Signed)
   THERAPIST PROGRESS NOTE  Session Time: 11:10-12pm  Participation Level: Active  Behavioral Response: CasualAlert/Anxious  Type of Therapy: Individual Therapy  Treatment Goals addressed: Coping  Interventions: CBT  Summary: [name] is a 42 y.o. female who presents for her  individual counseling session. Pt discussed her psychiatric symptoms and current life events. Pt reports her anxious symptoms have increased due to continued stress with her x husband. The conflict is on-going and pt has begun resorting to tears. Pt was able to open up and be vulnerable and cry in session about her fears with the ongoing conflict. Asked open ended questions and used empathic reflection. Suggested to pt the necessity of the continued use of her breathing exercises, mindfulness skills and visualization techniques when her chest starts hurting because of her anxiety. Practiced these techniques in session.          Suicidal/Homicidal: Nowithout intent/plan  Therapist Response: Assessed pt's current functioning by self report and reviewed progress. Assisted pt processing importance of self care, ongoing conflict, anxiety symptoms. Assisted pt processing for the management of her stressors.  Plan: Return again in 2 weeks for individua apppt and every week for OP group  Diagnosis: Axis I: [place], LCAS 02/15/2018

## 2018-02-16 ENCOUNTER — Ambulatory Visit (HOSPITAL_COMMUNITY): Payer: Self-pay | Admitting: Licensed Clinical Social Worker

## 2018-02-18 ENCOUNTER — Encounter: Payer: Self-pay | Admitting: Physical Medicine & Rehabilitation

## 2018-02-18 ENCOUNTER — Ambulatory Visit (HOSPITAL_BASED_OUTPATIENT_CLINIC_OR_DEPARTMENT_OTHER): Payer: PPO | Admitting: Physical Medicine & Rehabilitation

## 2018-02-18 ENCOUNTER — Encounter: Payer: PPO | Attending: Physical Medicine & Rehabilitation

## 2018-02-18 VITALS — BP 106/71 | HR 71 | Resp 14 | Ht 64.0 in | Wt 124.0 lb

## 2018-02-18 DIAGNOSIS — I69151 Hemiplegia and hemiparesis following nontraumatic intracerebral hemorrhage affecting right dominant side: Secondary | ICD-10-CM | POA: Diagnosis not present

## 2018-02-18 DIAGNOSIS — Z79899 Other long term (current) drug therapy: Secondary | ICD-10-CM | POA: Diagnosis not present

## 2018-02-18 DIAGNOSIS — Z8669 Personal history of other diseases of the nervous system and sense organs: Secondary | ICD-10-CM | POA: Insufficient documentation

## 2018-02-18 DIAGNOSIS — Z8673 Personal history of transient ischemic attack (TIA), and cerebral infarction without residual deficits: Secondary | ICD-10-CM | POA: Diagnosis not present

## 2018-02-18 NOTE — Patient Instructions (Signed)
[facility] Tendinitis [facility]hab Ask your health care provider which exercises are safe for you. Do exercises exactly as told by your health care provider and adjust them as directed. It is normal to feel mild stretching, pulling, tightness, or discomfort as you do these exercises, but you should stop right away if you feel sudden pain or your pain gets worse. Do not begin these exercises until told by your health care provider. Stretching and range of motion exercises These exercises warm up your muscles and joints and improve the movement and flexibility of your ankle. These exercises also help to relieve pain, numbness, and tingling. Exercise A: Standing wall calf stretch, knee straight  1. Stand with your hands against a wall. 2. Extend your __________ leg behind you and bend your front knee slightly. Keep both of your heels on the floor. 3. Point the toes of your back foot slightly inward. 4. Keeping your heels on the floor and your back knee straight, shift your weight toward the wall. Do not allow your back to arch. You should feel a gentle stretch in your calf. 5. Hold this position for seconds. Repeat __________ times. Complete this stretch __________ times per day. Exercise B: Standing wall calf stretch, knee bent 1. Stand with your hands against a wall. 2. Extend your __________ leg behind you, and bend your front knee slightly. Keep both of your heels on the floor. 3. Point the toes of your back foot slightly inward. 4. Keeping your heels on the floor, unlock your back knee so that it is bent. You should feel a gentle stretch deep in your calf. 5. Hold this position for __________ seconds. Repeat __________ times. Complete this stretch __________ times per day. Strengthening exercises These exercises build strength and control of your ankle. Endurance is the ability to use your muscles for a long time, even after they get tired. Exercise C: Plantar flexion with band  1. Sit on the floor  with your __________ leg extended. You may put a pillow under your calf to give your foot more room to move. 2. Loop a rubber exercise band or tube around the ball of your __________ foot. The ball of your foot is on the walking surface, right under your toes. The band or tube should be slightly tense when your foot is relaxed. If the band or tube slips, you can put on your shoe or put a washcloth between the band and your foot to help it stay in place. 3. Slowly point your toes downward, pushing them away from you. 4. Hold this position for __________ seconds. 5. Slowly release the tension in the band or tube, controlling smoothly until your foot is back to the starting position. Repeat __________ times. Complete this exercise __________ times per day. Exercise D: Heel raise with eccentric lower  1. Stand on a step with the balls of your feet. The ball of your foot is on the walking surface, right under your toes. ? Do not put your heels on the step. ? For balance, rest your hands on the wall or on a railing. 2. Rise up onto the balls of your feet. 3. Keeping your heels up, shift all of your weight to your __________ leg and pick up your other leg. 4. Slowly lower your __________ leg so your heel drops below the level of the step. 5. Put down your foot. If told by your health care provider, build up to:  3 sets of 15 repetitions while keeping your knees   straight.  3 sets of 15 repetitions while keeping your knees bent as far as told by your health care provider.  Complete this exercise __________ times per day. If this exercise is too easy, try doing it while wearing a backpack with weights in it. Balance exercises These exercises improve or maintain your balance. Balance is important in preventing falls. Exercise E: Single leg stand 1. Without shoes, stand near a railing or in a door frame. Hold on to the railing or door frame as needed. 2. Stand on your __________ foot. Keep your big  toe down on the floor and try to keep your arch lifted. 3. Hold this position for __________ seconds. Repeat __________ times. Complete this exercise __________ times per day. If this exercise is too easy, you can try it with your eyes closed or while standing on a pillow. This information is not intended to replace advice given to you by your health care provider. Make sure you discuss any questions you have with your health care provider. Document Released: 04/30/2005 Document Revised: 06/05/2016 Document Reviewed: 06/05/2015 Elsevier Interactive Patient Education  2018 [employer]  

## 2018-02-18 NOTE — Progress Notes (Signed)
Subjective:    Patient ID: Ashley [name], female    DOB: 06-01-1976, 42 y.o.   MRN: [id number]  HPI 45 min on Elliptical   Last seizure was ~69mo ago Has changed one of seizure meds due to insurance  Oxcarbazepine- for seizures Keppra  Sees Dr Delice Lesch from Neuro, discussed no driving until cleared   Sees counselor at behavioral health  [place] in [place] "broke her arm"  Doesn't remember which month  E chart Closed fracture proximal humerus, greater tuberosity 11/03/2017  Pain Inventory Average Pain 0 Pain Right Now 0 My pain is no pain  In the last 24 hours, has pain interfered with the following? General activity 0 Relation with others 0 Enjoyment of life 0 What TIME of day is your pain at its worst? no pain Sleep (in general) Good  Pain is worse with: no pain Pain improves with: no pain Relief from Meds: no pain  Mobility walk without assistance ability to climb steps?  yes do you drive?  yes transfers alone  Function not employed: date last employed .  Neuro/Psych anxiety  Prior Studies Any changes since last visit?  no  Physicians involved in your care Any changes since last visit?  no   Family History  Problem Relation Age of Onset  . Cancer Mother   . Cancer Father        pancreatic   Social History   Socioeconomic History  . Marital status: Married    Spouse name: Not on file  . Number of children: Not on file  . Years of education: Not on file  . Highest education level: Not on file  Occupational History  . Not on file  Social Needs  . Financial resource strain: Not on file  . Food insecurity:    Worry: Not on file    Inability: Not on file  . Transportation needs:    Medical: Not on file    Non-medical: Not on file  Tobacco Use  . Smoking status: Never Smoker  . Smokeless tobacco: Never Used  Substance and Sexual Activity  . Alcohol use: Yes    Alcohol/week: 0.6 oz    Types: 1 Glasses of wine per week    Comment: casual   .  Drug use: No  . Sexual activity: Yes    Birth control/protection: None    Comment: husband has vasectomy  Lifestyle  . Physical activity:    Days per week: Not on file    Minutes per session: Not on file  . Stress: Not on file  Relationships  . Social connections:    Talks on phone: Not on file    Gets together: Not on file    Attends religious service: Not on file    Active member of club or organization: Not on file    Attends meetings of clubs or organizations: Not on file    Relationship status: Not on file  Other Topics Concern  . Not on file  Social History Narrative  . Not on file   Past Surgical History:  Procedure Laterality Date  . DILATION AND CURETTAGE OF UTERUS     Past Medical History:  Diagnosis Date  . Anxiety   . Depression   . ICH (intracerebral hemorrhage) ([place])   . Seizures ([place])   . Stroke ([place])    BP 106/71 (BP Location: Left Arm, Patient Position: Sitting, Cuff Size: Normal)   Pulse 71   Resp 14   Ht 5\' 4"  (1.626  m)   Wt 124 lb (56.2 kg)   SpO2 99%   BMI 21.28 kg/m   Opioid Risk Score:   Fall Risk Score:  `1  Depression screen PHQ 2/9  Depression screen [facility] 2/9 08/20/2017 05/04/2017  Decreased Interest 1 3  Down, Depressed, Hopeless 1 3  PHQ - 2 Score 2 6  Some recent data might be hidden    Review of Systems  Constitutional: Negative.   HENT: Negative.   Eyes: Negative.   Respiratory: Negative.   Cardiovascular: Negative.   Gastrointestinal: Positive for constipation.  Endocrine: Negative.   Genitourinary: Negative.   Musculoskeletal: Negative.   Skin: Negative.   Allergic/Immunologic: Negative.   Neurological: Negative.   Hematological: Negative.   Psychiatric/Behavioral: The patient is nervous/anxious.        Objective:   Physical Exam  Constitutional: She is oriented to person, place, and time. She appears well-developed and well-nourished. No distress.  HENT:  Head: Normocephalic and atraumatic.  Eyes: Pupils are  equal, round, and reactive to light. EOM are normal.  Neurological: She is alert and oriented to person, place, and time.  Skin: She is not diaphoretic.  Psychiatric: She has a normal mood and affect. Her behavior is normal. Judgment and thought content normal. Cognition and memory are impaired.    Normal shoulder ROM Motor strength is 5- at the right deltoid bicep tricep grip 5 at the left deltoid bicep tricep grip 5 at the left hip flexor knee extensor ankle dorsiflexor 5 at the right knee extensor 4 at the left hip flexor 3- at the left ankle dorsiflexor  Sensation reported as equal to light touch bilateral upper and lower limb Gait is stiff leg gait no evidence of toe drag with AFO No knee instability  Ankle plantar flexion contracture     Assessment & Plan:   1.  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  Overall she has had a very good recovery and is modified independent with all activities and exercising on a regular basis.  We discussed exercising on the elliptical without the AFO as well as performing Achilles stretching exercises as instructed  2.  Post stroke seizure disorder follow-up with neurology.  I instructed patient to discuss her driving status with neurology given that she is 6 months post seizure  Physical medicine and rehabilitation follow-up in 1 year  is to call if she wishes to be seen sooner

## 2018-02-23 ENCOUNTER — Ambulatory Visit (HOSPITAL_COMMUNITY): Payer: Self-pay | Admitting: Licensed Clinical Social Worker

## 2018-02-24 ENCOUNTER — Emergency Department (HOSPITAL_COMMUNITY)
Admission: EM | Admit: 2018-02-24 | Discharge: 2018-02-24 | Disposition: A | Discharge status: Home / Self Care | Payer: PPO | Attending: Emergency Medicine | Admitting: Emergency Medicine

## 2018-02-24 ENCOUNTER — Encounter (HOSPITAL_COMMUNITY): Payer: Self-pay

## 2018-02-24 ENCOUNTER — Ambulatory Visit (HOSPITAL_COMMUNITY): Payer: Self-pay | Admitting: Licensed Clinical Social Worker

## 2018-02-24 DIAGNOSIS — Z8673 Personal history of transient ischemic attack (TIA), and cerebral infarction without residual deficits: Secondary | ICD-10-CM | POA: Diagnosis not present

## 2018-02-24 DIAGNOSIS — R55 Syncope and collapse: Secondary | ICD-10-CM | POA: Diagnosis not present

## 2018-02-24 DIAGNOSIS — R404 Transient alteration of awareness: Secondary | ICD-10-CM | POA: Diagnosis not present

## 2018-02-24 DIAGNOSIS — Z79899 Other long term (current) drug therapy: Secondary | ICD-10-CM | POA: Insufficient documentation

## 2018-02-24 DIAGNOSIS — Z7982 Long term (current) use of aspirin: Secondary | ICD-10-CM | POA: Diagnosis not present

## 2018-02-24 DIAGNOSIS — R42 Dizziness and giddiness: Secondary | ICD-10-CM | POA: Diagnosis not present

## 2018-02-24 DIAGNOSIS — E860 Dehydration: Secondary | ICD-10-CM | POA: Insufficient documentation

## 2018-02-24 LAB — URINALYSIS, ROUTINE W REFLEX MICROSCOPIC
Bilirubin Urine: NEGATIVE
Glucose, UA: NEGATIVE mg/dL
Ketones, ur: NEGATIVE mg/dL
Leukocytes, UA: NEGATIVE
Nitrite: NEGATIVE
PROTEIN: NEGATIVE mg/dL
Specific Gravity, Urine: 1.003 — ABNORMAL LOW (ref 1.005–1.030)
pH: 7 (ref 5.0–8.0)

## 2018-02-24 LAB — COMPREHENSIVE METABOLIC PANEL
ALBUMIN: 4.2 g/dL (ref 3.5–5.0)
ALT: 17 U/L (ref 14–54)
AST: 28 U/L (ref 15–41)
Alkaline Phosphatase: 52 U/L (ref 38–126)
Anion gap: 10 (ref 5–15)
BILIRUBIN TOTAL: 0.5 mg/dL (ref 0.3–1.2)
BUN: 7 mg/dL (ref 6–20)
CHLORIDE: 90 mmol/L — AB (ref 101–111)
CO2: 25 mmol/L (ref 22–32)
Calcium: 8.9 mg/dL (ref 8.9–10.3)
Creatinine, Ser: 0.49 mg/dL (ref 0.44–1.00)
GFR calc Af Amer: >60 mL/min (ref >60)
GFR calc non Af Amer: >60 mL/min (ref >60)
GLUCOSE: 81 mg/dL (ref 65–99)
POTASSIUM: 4.7 mmol/L (ref 3.5–5.1)
Sodium: 125 mmol/L — ABNORMAL LOW (ref 135–145)
TOTAL PROTEIN: 6.9 g/dL (ref 6.5–8.1)

## 2018-02-24 LAB — CBC WITH DIFFERENTIAL/PLATELET
BASOS ABS: 0 10*3/uL (ref 0.0–0.1)
BASOS PCT: 1 %
Eosinophils Absolute: 0.1 10*3/uL (ref 0.0–0.7)
Eosinophils Relative: 2 %
HCT: 34.3 % — ABNORMAL LOW (ref 36.0–46.0)
Hemoglobin: 12 g/dL (ref 12.0–15.0)
Lymphocytes Relative: 19 %
Lymphs Abs: 0.7 10*3/uL (ref 0.7–4.0)
MCH: 31.8 pg (ref 26.0–34.0)
MCHC: 35 g/dL (ref 30.0–36.0)
MCV: 91 fL (ref 78.0–100.0)
MONO ABS: 0.3 10*3/uL (ref 0.1–1.0)
Monocytes Relative: 8 %
NEUTROS PCT: 70 %
Neutro Abs: 2.6 10*3/uL (ref 1.7–7.7)
Platelets: 214 10*3/uL (ref 150–400)
RBC: 3.77 MIL/uL — AB (ref 3.87–5.11)
RDW: 12.4 % (ref 11.5–15.5)
WBC: 3.7 10*3/uL — AB (ref 4.0–10.5)

## 2018-02-24 LAB — MAGNESIUM: Magnesium: 1.7 mg/dL (ref 1.7–2.4)

## 2018-02-24 LAB — I-STAT CG4 LACTIC ACID, ED: Lactic Acid, Venous: 1.02 mmol/L (ref 0.5–1.9)

## 2018-02-24 MED ORDER — SODIUM CHLORIDE 0.9 % IV BOLUS
1000.0000 mL | Freq: Once | INTRAVENOUS | Status: AC
  Administered 2018-02-24: 1000 mL via INTRAVENOUS

## 2018-02-24 NOTE — ED Notes (Signed)
Pt b/p 86/63 informed the nurse

## 2018-02-24 NOTE — ED Provider Notes (Addendum)
[facility] DEPT Provider Note   CSN: [id number] Arrival date & time: 02/24/18  0636     History   Chief Complaint Chief Complaint  Patient presents with  . Loss of Consciousness    HPI Ashley [name] is a 42 y.o. female.  The history is provided by the patient, the spouse and medical records.  Loss of Consciousness   This is a recurrent problem. The current episode started less than 1 hour ago. The problem occurs rarely. The problem has been resolved. She lost consciousness for a period of less than one minute. The problem is associated with standing up (after urination). Associated symptoms include light-headedness and weakness (at baseline). Pertinent negatives include abdominal pain, back pain, bowel incontinence, chest pain, confusion, congestion, diaphoresis, fever, headaches, malaise/fatigue, nausea, palpitations, slurred speech, vertigo and vomiting. She has tried nothing for the symptoms. The treatment provided no relief. Her past medical history is significant for CVA and seizures.    Past Medical History:  Diagnosis Date  . Anxiety   . Depression   . ICH (intracerebral hemorrhage) ([place])   . Seizures ([place])   . Stroke [facility])     Patient Active Problem List   Diagnosis Date Noted  . Depression 03/23/2017  . Anxiety 03/23/2017  . Muscle weakness 02/03/2017  . History of hemorrhagic stroke with residual hemiparesis ([place]) 12/10/2016  . Spastic hemiplegia and hemiparesis affecting dominant side ([place]) 09/13/2015  . Adhesive capsulitis of right shoulder 08/24/2015  . Localization-related symptomatic epilepsy and epileptic syndromes with simple partial seizures, not intractable, without status epilepticus ([place]) 08/21/2015  . Nontraumatic cortical hemorrhage of cerebral hemisphere ([place])   . Seizure ([place])   . Seizures ([place])   . Adjustment disorder with depressed mood   . Contracture of muscle ankle and foot 06/11/2015  . Hemiplga fol ntrm  intcrbl hemor aff right dominant side ([place]) 06/06/2015  . Left-sided intracerebral hemorrhage ([place]) 06/04/2015  . Seizure disorder as sequela of cerebrovascular accident ([place])   . Seizure disorder ([place]) 06/03/2015  . Sepsis ([place]) 06/03/2015  . UTI (urinary tract infection) 06/03/2015  . Hypotension 06/02/2015  . Right spastic hemiparesis ([place]) 05/28/2015  . Aphasia following nontraumatic intracerebral hemorrhage 05/28/2015  . History of anxiety disorder 05/28/2015  . ICH (intracerebral hemorrhage) ([place])   . Seizure disorder, nonconvulsive, with status epilepticus ([place])   . Cerebral venous thrombosis of cortical vein   . Cytotoxic cerebral edema ([place])   . IVH (intraventricular hemorrhage) ([place])   . Term pregnancy 05/07/2015  . Spontaneous vaginal delivery 05/07/2015    Past Surgical History:  Procedure Laterality Date  . DILATION AND CURETTAGE OF UTERUS       OB History    Gravida  9   Para  3   Term  3   Preterm      AB  6   Living  3     SAB  3   TAB  2   Ectopic      Multiple  0   Live Births  3            Home Medications    Prior to Admission medications   Medication Sig Start Date End Date Taking? Authorizing Provider  aspirin 81 MG chewable tablet Chew 81 mg by mouth daily.    [provider]  Cholecalciferol (VITAMIN D) 2000 units CAPS Take 2,000 Units by mouth daily.    [provider]  FLUoxetine (PROZAC) 20 MG capsule Take 1 capsule (  20 mg total) by mouth daily. Take with 40 mg for total of 60 mg daily 12/21/17 12/21/18  Aundra Dubin, MD  FLUoxetine (PROZAC) 40 MG capsule Take by mouth.    [provider]  levETIRAcetam (KEPPRA) 750 MG tablet Take 2 tablets (1,500 mg total) by mouth 2 (two) times daily. 01/08/18   Cameron Sprang, MD  LORazepam (ATIVAN) 1 MG tablet Take 1 tablet as needed for seizure 12/10/16   Cameron Sprang, MD  mirtazapine (REMERON) 30 MG tablet Take 1 tablet (30 mg total) by mouth at  bedtime. 12/21/17 12/21/18  Aundra Dubin, MD  Multiple Vitamin (MULTIVITAMIN) tablet Take 1 tablet by mouth daily.     [provider]  Oxcarbazepine (TRILEPTAL) 300 MG tablet take 2 tablets twice a day 01/08/18   Cameron Sprang, MD  Sulfamethoxazole-Trimethoprim (BACTRIM PO) Take 160 mg by mouth daily.    [provider]    Family History Family History  Problem Relation Age of Onset  . Cancer Mother   . Cancer Father        pancreatic    Social History Social History   Tobacco Use  . Smoking status: Never Smoker  . Smokeless tobacco: Never Used  Substance Use Topics  . Alcohol use: Yes    Alcohol/week: 0.6 oz    Types: 1 Glasses of wine per week    Comment: casual   . Drug use: No     Allergies   Patient has no known allergies.   Review of Systems Review of Systems  Constitutional: Negative for chills, diaphoresis, fatigue, fever and malaise/fatigue.  HENT: Negative for congestion.   Eyes: Negative for visual disturbance.  Respiratory: Negative for cough, chest tightness and wheezing.   Cardiovascular: Positive for syncope. Negative for chest pain and palpitations.  Gastrointestinal: Positive for constipation. Negative for abdominal pain, bowel incontinence, diarrhea, nausea and vomiting.  Genitourinary: Negative for dysuria.  Musculoskeletal: Negative for back pain, neck pain and neck stiffness.  Skin: Negative for rash and wound.  Neurological: Positive for syncope, weakness (at baseline) and light-headedness. Negative for vertigo, tremors, facial asymmetry, numbness (at baseline) and headaches.  Psychiatric/Behavioral: Negative for agitation and confusion.  All other systems reviewed and are negative.    Physical Exam Updated Vital Signs BP (!) 86/63 (BP Location: Left Arm)   Pulse 63   Temp 97.6 F (36.4 C) (Oral)   Resp 17   SpO2 100%   Physical Exam  Constitutional: She is oriented to person, place, and time. She appears  well-developed and well-nourished. No distress.  HENT:  Head: Normocephalic and atraumatic.  Nose: Nose normal.  Mouth/Throat: Oropharynx is clear and moist. No oropharyngeal exudate.  Eyes: Conjunctivae are normal.  Neck: Neck supple.  Cardiovascular: Normal rate and regular rhythm.  No murmur heard. Pulmonary/Chest: Effort normal and breath sounds normal. No respiratory distress. She has no wheezes. She has no rales. She exhibits no tenderness.  Abdominal: Soft. There is no tenderness.  Musculoskeletal: She exhibits no edema or tenderness.  Neurological: She is alert and oriented to person, place, and time. She is not disoriented. A sensory deficit is present. No cranial nerve deficit. She exhibits abnormal muscle tone. GCS eye subscore is 4. GCS verbal subscore is 5. GCS motor subscore is 6.  Numbness and weakness in right arm and right leg.  Patient reports that is unchanged from her baseline.  No facial droop no weakness or numbness seen in the face.  Skin: Skin  is warm and dry. Capillary refill takes less than 2 seconds. No rash noted. She is not diaphoretic. No erythema.  Psychiatric: She has a normal mood and affect.  Nursing note and vitals reviewed.    ED Treatments / Results  Labs (all labs ordered are listed, but only abnormal results are displayed) Labs Reviewed  CBC WITH DIFFERENTIAL/PLATELET - Abnormal; Notable for the following components:      Result Value   WBC 3.7 (*)    RBC 3.77 (*)    HCT 34.3 (*)    All other components within normal limits  COMPREHENSIVE METABOLIC PANEL - Abnormal; Notable for the following components:   Sodium 125 (*)    Chloride 90 (*)    All other components within normal limits  URINALYSIS, ROUTINE W REFLEX MICROSCOPIC - Abnormal; Notable for the following components:   Color, Urine STRAW (*)    Specific Gravity, Urine 1.003 (*)    Hgb urine dipstick SMALL (*)    Bacteria, UA RARE (*)    All other components within normal limits    URINE CULTURE  MAGNESIUM  I-STAT CG4 LACTIC ACID, ED    EKG EKG Interpretation  Date/Time:  Wednesday Feb 24 2018 08:00:41 EDT Ventricular Rate:  63 PR Interval:    QRS Duration: 102 QT Interval:  460 QTC Calculation: 471 R Axis:   82 Text Interpretation:  Sinus rhythm Short PR interval When compared to prior, no significant changes seen.  No STEMI Confirmed by Antony Blackbird [phone]) on 02/24/2018 8:17:06 AM   Radiology No results found.  Procedures Procedures (including critical care time) CRITICAL CARE Performed by: Gwenyth Allegra Logen Fowle Total critical care time: 35 minutes Critical care time was exclusive of separately billable procedures and treating other patients. Critical care was necessary to treat or prevent imminent or life-threatening deterioration. Critical care was time spent personally by me on the following activities: development of treatment plan with patient and/or surrogate as well as nursing, discussions with consultants, evaluation of patient's response to treatment, examination of patient, obtaining history from patient or surrogate, ordering and performing treatments and interventions, ordering and review of laboratory studies, ordering and review of radiographic studies, pulse oximetry and re-evaluation of patient's condition.   Medications Ordered in ED Medications  sodium chloride 0.9 % bolus 1,000 mL (0 mLs Intravenous Stopped 02/24/18 1003)  sodium chloride 0.9 % bolus 1,000 mL (0 mLs Intravenous Stopped 02/24/18 1004)  sodium chloride 0.9 % bolus 1,000 mL (0 mLs Intravenous Stopped 02/24/18 1121)     Initial Impression / Assessment and Plan / ED Course  I have reviewed the triage vital signs and the nursing notes.  Pertinent labs & imaging results that were available during my care of the patient were reviewed by me and considered in my medical decision making (see chart for details).     Ashley [name] is a 42 y.o. female with past medical  history significant for prior intracerebral hemorrhage, stroke with residual right-sided weakness, seizure, anxiety, depression, and chronic hypotension who presents with syncopal episode and lightheadedness.  Patient reports she is been doing well over the last several days and has had no symptoms.  She says that this morning she woke up and went to the bathroom early this morning and then after urination, stood up to leave the bathroom and syncopized.  She reports feeling lightheaded and passing out.  She was only out for several seconds and denies any headache or hitting her head.  She reports that he  tried to sit back up and got lightheaded again.  She did not syncopized a second time but was unable to get up due to the fatigue and lightheadedness.  According to nursing report from EMS, patient's blood pressures were in the 90s and 80s when she was lying down but had dropped into the 69S and 85I systolic when she stood up.  Patient says that she has had the symptoms in the past when she was dehydrated.  She denies any recent fevers, chills, chest pain, shortness breath, palpitations, nausea, vomiting,  diarrhea, or urinary symptoms.  She does report chronic constipation.  She denies any recent medication changes.  She denies any other complaints on arrival.  Specifically denies any neurologic changes from her baseline as well as a double vision, blurry vision, headaches, or neck pain.  Family denies patient lost bowel or bladder function and she had no shaking.  This is not consistent with one of her seizures.  On exam, patient has right-sided numbness and weakness in her right arm and right leg.  This is at her baseline by report.  Lungs are clear and chest is nontender.  Head is nontender neck is nontender.  Normal extraocular movements and pupil exam.  Abdomen nontender.  Legs nontender.  No significant edema or rashes seen.  Based on her syncope, hypotension, and orthostatics, suspect patient is  dehydrated.  Patient will have work-up including electrolytes, laboratory testing, and urinalysis to look for occult infection.  With lack of any respiratory or chest symptoms, do not feel patient needs chest x-ray or head CT at this time.  Patient will be given fluids during initial work-up.  Next  Family is on board with giving fluids and getting screening laboratory testing.  Suspect patient will be stable for discharge home if orthostatics are improved and lightheadedness is resolved with ambulation.  Anticipate reassessment for rehydration and labs.  12:03 PM After 3 L of fluid, patient reports her lightheadedness has resolved.  Patient was able to stand and only had mild drop in her blood pressure.  Patient does not appear orthostatic.  Patient's laboratory testing showed mild hyponatremia and hypochloremia, suspect dehydration.  No evidence of infection or sepsis.  Other electrolytes reassuring.  Given patient's reassuring work-up and now resolution of symptoms, patient was felt stable for discharge home.  Suspect orthostatic hypotension and a micturition syncopal episode.  Patient and husband agreed with discharge after shared decision making conversation.  They also agreed with close PCP follow-up and return precautions if any symptoms return or worsen.  Patient and family had no depressions or concerns and patient was discharged in good condition with resolution of lightheadedness and no further syncopal episodes.   Final Clinical Impressions(s) / ED Diagnoses   Final diagnoses:  Syncope, unspecified syncope type  Lightheadedness  Dehydration    ED Discharge Orders    None      Clinical Impression: 1. Syncope, unspecified syncope type   2. Lightheadedness   3. Dehydration     Disposition: Discharge  Condition: Good  I have discussed the results, Dx and Tx plan with the pt(& family if present). He/she/they expressed understanding and agree(s) with the plan. Discharge  instructions discussed at great length. Strict return precautions discussed and pt &/or family have verbalized understanding of the instructions. No further questions at time of discharge.    New Prescriptions   No medications on file    Follow Up: [facility], [place] [address] [phone]  [facility] DEPT [place] [id number] mc [place] [phone]       Alireza Pollack, Gwenyth Allegra, MD 02/24/18 2014    Rockland Kotarski, Gwenyth Allegra, MD 03/21/18 1140

## 2018-02-24 NOTE — Discharge Instructions (Signed)
Your work-up today showed dehydration but was otherwise reassuring.  We did not find evidence of acute infections and you did not have any evidence of acute trauma.  Please follow-up with your primary care physician in the next several days.  If any symptoms change or worsen, please return to the nearest emergency department.

## 2018-02-24 NOTE — ED Notes (Signed)
Waiting until bolus is complete before patient gets up to bathroom

## 2018-02-24 NOTE — ED Triage Notes (Signed)
Pt arrived from home via [facility] due to syncopal episode. Pt states she got up to used the restroom and lost consciousness against wall, unable to stand back up after without feeling like she would lose consciousness again. Pt positive for orthostatics.

## 2018-02-26 DIAGNOSIS — Z6821 Body mass index (BMI) 21.0-21.9, adult: Secondary | ICD-10-CM | POA: Diagnosis not present

## 2018-02-26 DIAGNOSIS — R55 Syncope and collapse: Secondary | ICD-10-CM | POA: Diagnosis not present

## 2018-02-26 DIAGNOSIS — K5909 Other constipation: Secondary | ICD-10-CM | POA: Diagnosis not present

## 2018-02-26 LAB — URINE CULTURE: Culture: 60000 — AB

## 2018-02-27 ENCOUNTER — Telehealth: Payer: Self-pay

## 2018-02-27 NOTE — Progress Notes (Signed)
ED Antimicrobial Stewardship Positive Culture Follow Up   Ashley [name] is an 42 y.o. female who presented to [facility] on 02/24/2018 with a chief complaint of  Chief Complaint  Patient presents with  . Loss of Consciousness    Recent Results (from the past 720 hour(s))  Urine culture     Status: Abnormal   Collection Time: 02/24/18 10:50 AM  Result Value Ref Range Status   Specimen Description   Final    URINE, RANDOM Performed at [facility] [address], [place], [place] [address]    Special Requests   Final    NONE Performed at [facility], [place] [address], [place], [place] [address]    Culture 60,000 COLONIES/mL ESCHERICHIA COLI (A)  Final   Report Status 02/26/2018 FINAL  Final   Organism ID, Bacteria ESCHERICHIA COLI (A)  Final      Susceptibility   Escherichia coli - MIC*    AMPICILLIN >=32 RESISTANT Resistant     CEFAZOLIN <=4 SENSITIVE Sensitive     CEFTRIAXONE <=1 SENSITIVE Sensitive     CIPROFLOXACIN 0.5 SENSITIVE Sensitive     GENTAMICIN >=16 RESISTANT Resistant     IMIPENEM <=0.25 SENSITIVE Sensitive     NITROFURANTOIN <=16 SENSITIVE Sensitive     TRIMETH/SULFA >=320 RESISTANT Resistant     AMPICILLIN/SULBACTAM 16 INTERMEDIATE Intermediate     PIP/TAZO <=4 SENSITIVE Sensitive     Extended ESBL NEGATIVE Sensitive     * 60,000 COLONIES/mL ESCHERICHIA COLI    No symptoms. Insignificant growth < 100,000 colonies. Do not treat.   ED Provider: Margarita Mail, PA-C   Susa Raring 02/27/2018, 9:21 AM Infectious Diseases Pharmacy Resident  Phone# [phone]

## 2018-02-27 NOTE — Telephone Encounter (Signed)
No treatment needed for UC in ED 02/24/18 per Margarita Mail [facility]

## 2018-03-01 ENCOUNTER — Ambulatory Visit (HOSPITAL_COMMUNITY): Payer: Self-pay | Admitting: Licensed Clinical Social Worker

## 2018-03-02 ENCOUNTER — Ambulatory Visit (HOSPITAL_COMMUNITY): Payer: Self-pay | Admitting: Licensed Clinical Social Worker

## 2018-03-09 ENCOUNTER — Ambulatory Visit (HOSPITAL_COMMUNITY): Payer: PPO | Admitting: Licensed Clinical Social Worker

## 2018-03-09 ENCOUNTER — Encounter (HOSPITAL_COMMUNITY): Payer: Self-pay | Admitting: Licensed Clinical Social Worker

## 2018-03-09 DIAGNOSIS — F332 Major depressive disorder, recurrent severe without psychotic features: Secondary | ICD-10-CM

## 2018-03-09 NOTE — Progress Notes (Signed)
   THERAPIST PROGRESS NOTE  Session Time: 11:10-12pm  Participation Level: Active  Behavioral Response: CasualAlert/Tearful  Type of Therapy: Individual Therapy  Treatment Goals addressed: Coping  Interventions: CBT  Summary: Ashley [name] is a 42 y.o. female who presents for her  individual counseling session. Pt discussed her psychiatric symptoms and current life events. Pt presents sad to her appt today. Her mother has been dx with blood cancer. Asked open ended questions and used empathic reflection. Pt feels she has enough stress in her life. She has a pending mediation with her x husband about schools for their children. She and her current husband are trying to figure out their own relationship. Pt continues to have symptoms of her previous stroke. Supported pt in her struggles. Suggested to pt the necessity of the continued use of her breathing exercises, mindfulness skills and visualization techniques.          Suicidal/Homicidal: Nowithout intent/plan  Therapist Response: Assessed pt's current functioning by self report and reviewed progress. Assisted pt processing importance of mother's cancer dx, upcoming mediation, symptoms of previous stroke, current marital relationship issues. Assisted pt processing for the management of her stressors.  Plan: Return again in 2 weeks   Diagnosis: Axis I: MDD         Jenkins Rouge, LCAS 03/09/2018

## 2018-03-10 DIAGNOSIS — E871 Hypo-osmolality and hyponatremia: Secondary | ICD-10-CM | POA: Diagnosis not present

## 2018-03-12 ENCOUNTER — Ambulatory Visit (INDEPENDENT_AMBULATORY_CARE_PROVIDER_SITE_OTHER): Payer: PPO | Admitting: Psychiatry

## 2018-03-12 ENCOUNTER — Encounter (HOSPITAL_COMMUNITY): Payer: Self-pay | Admitting: Psychiatry

## 2018-03-12 DIAGNOSIS — Z79899 Other long term (current) drug therapy: Secondary | ICD-10-CM | POA: Diagnosis not present

## 2018-03-12 DIAGNOSIS — F3341 Major depressive disorder, recurrent, in partial remission: Secondary | ICD-10-CM

## 2018-03-12 MED ORDER — MIRTAZAPINE 30 MG PO TABS: 30.0000 mg | ORAL_TABLET | Freq: Every day | ORAL | 1 refills | Status: DC

## 2018-03-12 MED ORDER — FLUOXETINE HCL 40 MG PO CAPS
40.0000 mg | ORAL_CAPSULE | Freq: Every day | ORAL | 1 refills | Status: DC
Start: 2018-03-12 — End: 2018-06-08

## 2018-03-12 MED ORDER — FLUOXETINE HCL 20 MG PO CAPS: 20.0000 mg | ORAL_CAPSULE | Freq: Every day | ORAL | 1 refills | Status: DC

## 2018-03-12 NOTE — Progress Notes (Signed)
[place] MD/PA/NP OP Progress Note  03/12/2018 10:27 AM Ashley [name]  MRN:  [id number]  Chief Complaint: doing really well HPI: Ashley [name] presents for medication management follow-up.  She feels very stable on the current dose of Prozac and Remeron.  No significant mood changes.  Sleeping well and appetite is good.    Disclosed to patient that this Probation officer is leaving this practice at the end of August 2019, and patients always has the right to choose their provider. Reassured patient that office will work to provide smooth transition of care whether they wish to remain at this office, or to continue with this provider, or seek alternative care options in community.  They expressed understanding.   Visit Diagnosis:    ICD-10-CM   1. Recurrent major depressive disorder, in partial remission (HCC) F33.41 mirtazapine (REMERON) 30 MG tablet    FLUoxetine (PROZAC) 20 MG capsule    FLUoxetine (PROZAC) 40 MG capsule    Past Psychiatric History: See intake H&P for full details. Reviewed, with no updates at this time.   Past Medical History:  Past Medical History:  Diagnosis Date  . Anxiety   . Depression   . ICH (intracerebral hemorrhage) ([place])   . Seizures ([employer])   . Stroke [facility])     Past Surgical History:  Procedure Laterality Date  . DILATION AND CURETTAGE OF UTERUS      Family Psychiatric History: See intake H&P for full details. Reviewed, with no updates at this time.   Family History:  Family History  Problem Relation Age of Onset  . Cancer Mother   . Cancer Father        pancreatic    Social History:  Social History   Socioeconomic History  . Marital status: Married    Spouse name: Not on file  . Number of children: Not on file  . Years of education: Not on file  . Highest education level: Not on file  Occupational History  . Not on file  Social Needs  . Financial resource strain: Not on file  . Food insecurity:    Worry: Not on file    Inability: Not on file   . Transportation needs:    Medical: Not on file    Non-medical: Not on file  Tobacco Use  . Smoking status: Never Smoker  . Smokeless tobacco: Never Used  Substance and Sexual Activity  . Alcohol use: Yes    Alcohol/week: 0.6 oz    Types: 1 Glasses of wine per week    Comment: casual   . Drug use: No  . Sexual activity: Yes    Birth control/protection: None    Comment: husband has vasectomy  Lifestyle  . Physical activity:    Days per week: Not on file    Minutes per session: Not on file  . Stress: Not on file  Relationships  . Social connections:    Talks on phone: Not on file    Gets together: Not on file    Attends religious service: Not on file    Active member of club or organization: Not on file    Attends meetings of clubs or organizations: Not on file    Relationship status: Not on file  Other Topics Concern  . Not on file  Social History Narrative  . Not on file    Allergies: No Known Allergies  Metabolic Disorder Labs: Lab Results  Component Value Date   HGBA1C 5.1 02/07/2016   No results found for:  PROLACTIN Lab Results  Component Value Date   CHOL 194 02/07/2016   TRIG 45 02/07/2016   HDL 90 02/07/2016   CHOLHDL 2.2 02/07/2016   LDLCALC 95 02/07/2016   No results found for: TSH  Therapeutic Level Labs: No results found for: LITHIUM Lab Results  Component Value Date   VALPROATE <10 (L) 06/01/2015   No components found for:  CBMZ  Current Medications: Current Outpatient Medications  Medication Sig Dispense Refill  . aspirin 81 MG chewable tablet Chew 81 mg by mouth daily.    . Cholecalciferol (VITAMIN D) 2000 units CAPS Take 2,000 Units by mouth daily.    Marland Kitchen FLUoxetine (PROZAC) 20 MG capsule Take 1 capsule (20 mg total) by mouth daily. Take with 40 mg for total of 60 mg daily 90 capsule 1  . FLUoxetine (PROZAC) 40 MG capsule Take 1 capsule (40 mg total) by mouth daily. 90 capsule 1  . levETIRAcetam (KEPPRA) 750 MG tablet Take 2 tablets  (1,500 mg total) by mouth 2 (two) times daily. 120 tablet 11  . LORazepam (ATIVAN) 1 MG tablet Take 1 tablet as needed for seizure 10 tablet 3  . mirtazapine (REMERON) 30 MG tablet Take 1 tablet (30 mg total) by mouth at bedtime. 90 tablet 1  . Multiple Vitamin (MULTIVITAMIN) tablet Take 1 tablet by mouth daily.     . Oxcarbazepine (TRILEPTAL) 300 MG tablet take 2 tablets twice a day 120 tablet 6  . Sulfamethoxazole-Trimethoprim (BACTRIM PO) Take 160 mg by mouth daily.     No current facility-administered medications for this visit.    Facility-Administered Medications Ordered in Other Visits  Medication Dose Route Frequency Provider Last Rate Last Dose  . gadopentetate dimeglumine (MAGNEVIST) injection 12 mL  12 mL Intravenous Once PRN Garvin Fila, MD         Musculoskeletal: Strength & Muscle Tone: within normal limits Gait & Station: normal Patient leans: N/A  Psychiatric Specialty Exam: ROS  Blood pressure 101/67, pulse 70, height 5\' 4"  (1.626 m), weight 120 lb 12.8 oz (54.8 kg), SpO2 99 %.Body mass index is 20.74 kg/m.  General Appearance: Casual and Fairly Groomed  Eye Contact:  Good  Speech:  Clear and Coherent and Normal Rate  Volume:  Normal  Mood:  Euthymic  Affect:  Appropriate and Congruent  Thought Process:  Coherent and Descriptions of Associations: Intact  Orientation:  Full (Time, Place, and Person)  Thought Content: Logical   Suicidal Thoughts:  No  Homicidal Thoughts:  No  Memory:  Immediate;   Good  Judgement:  Good  Insight:  Good  Psychomotor Activity:  Normal  Concentration:  Attention Span: Good  Recall:  Good  Fund of Knowledge: Good  Language: Good  Akathisia:  Negative  Handed:  Right  AIMS (if indicated): not done  Assets:  Communication Skills Desire for Improvement Financial Resources/Insurance Housing  ADL's:  Intact  Cognition: WNL  Sleep:  Good   Screenings: PHQ2-9     [facility]  PHQ-2 Total Score  2  6  2   PHQ-9 Total Score  -  -  8       Assessment and Plan:  Ashley [name] presents as stable on the current medication regimen.  No acute mood changes, safety concerns, and she continues to do well and working with her therapist on processing  some of her grief.  1. Recurrent major depressive disorder, in partial remission ([place])     Status of current problems: stable  Labs Ordered: No orders of the defined types were placed in this encounter.   Labs Reviewed: n/a  Collateral Obtained/Records Reviewed: n/a  Plan:  Continue Prozac 60 mg daily and Remeron 30 mg nightly Return to clinic in 3 months   Aundra Dubin, MD 03/12/2018, 10:27 AM

## 2018-03-15 ENCOUNTER — Ambulatory Visit (HOSPITAL_COMMUNITY): Payer: Self-pay | Admitting: Licensed Clinical Social Worker

## 2018-03-16 ENCOUNTER — Ambulatory Visit (HOSPITAL_COMMUNITY): Payer: Self-pay | Admitting: Psychiatry

## 2018-03-22 ENCOUNTER — Telehealth: Payer: Self-pay | Admitting: Neurology

## 2018-03-22 NOTE — Telephone Encounter (Signed)
Unfortunately we do not carry samples of this in our office.  Pt can ask pharmacy for bridge tabs.

## 2018-03-22 NOTE — Telephone Encounter (Signed)
Patient is going to run out of medication oxcarbazepine  Before she can get a refill on it. She will be about 4 days would liek to know if she can get some samples to hold her over

## 2018-03-25 ENCOUNTER — Encounter (HOSPITAL_COMMUNITY): Payer: Self-pay | Admitting: Licensed Clinical Social Worker

## 2018-03-25 ENCOUNTER — Ambulatory Visit (INDEPENDENT_AMBULATORY_CARE_PROVIDER_SITE_OTHER): Payer: PPO | Admitting: Licensed Clinical Social Worker

## 2018-03-25 DIAGNOSIS — F3341 Major depressive disorder, recurrent, in partial remission: Secondary | ICD-10-CM

## 2018-03-25 NOTE — Progress Notes (Signed)
   THERAPIST PROGRESS NOTE  Session Time: 9:10-11am  Participation Level: Active  Behavioral Response: CasualAlert/Anxious  Type of Therapy: Individual Therapy  Treatment Goals addressed: Coping  Interventions: CBT  Summary: Ashley [name] is a 42 y.o. female who presents for her  individual counseling session. Pt discussed her psychiatric symptoms and current life events. Pt presents anxious today. She had her mediation this week with her x husband. It was an arduous task, anxiety ridden. Asked open ended questions and used empathic reflection. Her children are out for the summer and she is being with them, taking the to camps, practices and assisting her mother with her dx of cancer. Pt admits she is overwhelmed. Did some breathing exercises in session with pt which assisted in bringing her anxiety level down. Continue to suggest to pt the necessity of the continued use of her breathing exercises, mindfulness skills and visualization techniques.          Suicidal/Homicidal: Nowithout intent/plan  Therapist Response: Assessed pt's current functioning by self report and reviewed progress. Assisted pt processing her anxiety symptoms, meditation, feelings of being overwhelmed, mother's cancer dx. Assisted pt processing for the management of her stressors.  Plan: Return again in 2 weeks   Diagnosis: Axis I: MDD         Jenkins Rouge, LCAS 03/25/2018

## 2018-03-29 ENCOUNTER — Ambulatory Visit (HOSPITAL_COMMUNITY): Payer: Self-pay | Admitting: Licensed Clinical Social Worker

## 2018-03-29 ENCOUNTER — Encounter

## 2018-03-30 ENCOUNTER — Ambulatory Visit (INDEPENDENT_AMBULATORY_CARE_PROVIDER_SITE_OTHER): Payer: PPO | Admitting: Licensed Clinical Social Worker

## 2018-03-30 ENCOUNTER — Encounter (HOSPITAL_COMMUNITY): Payer: Self-pay | Admitting: Licensed Clinical Social Worker

## 2018-03-30 DIAGNOSIS — F3341 Major depressive disorder, recurrent, in partial remission: Secondary | ICD-10-CM | POA: Diagnosis not present

## 2018-03-30 DIAGNOSIS — F419 Anxiety disorder, unspecified: Secondary | ICD-10-CM

## 2018-03-30 DIAGNOSIS — E871 Hypo-osmolality and hyponatremia: Secondary | ICD-10-CM | POA: Diagnosis not present

## 2018-03-30 NOTE — Progress Notes (Signed)
Daily Group Progress Note       Program: OP Evening Group   Group Time: 5:30-6:30pm  Participation Level: Active  Behavioral Response: Appropriate  Type of Therapy:  Psychoeducation/Therapy  Summary of Progress: Pt participated a discussion on stress management, discovering not all stress is bad. Pt identified that stress response is a tool used to increase the odds of overcoming obstacles, before becoming too intense or problematic.  Pt was encouraged to continue using her stress management skills.  Jenkins Rouge, LCAS

## 2018-04-06 ENCOUNTER — Ambulatory Visit (HOSPITAL_COMMUNITY): Payer: Self-pay | Admitting: Licensed Clinical Social Worker

## 2018-04-06 ENCOUNTER — Telehealth: Payer: Self-pay

## 2018-04-06 ENCOUNTER — Telehealth: Payer: Self-pay | Admitting: Neurology

## 2018-04-06 NOTE — Telephone Encounter (Signed)
Spoke with pt relaying message below.  She has not had any recent seizures.  Pt agreeable to lowering Oxcarbazepine.

## 2018-04-06 NOTE — Telephone Encounter (Signed)
Received bloodwork from PCP done 03/31/18, Na 126 (125 last 02/24/18). She is on free water restriction and will recheck BMP in 2 weeks. Concern this is related to Trileptal.

## 2018-04-06 NOTE — Telephone Encounter (Signed)
-----   Message from Cameron Sprang, MD sent at 04/06/2018  9:30 AM EDT ----- Regarding: oxcarbazepine Can you pls let her know I received the bloodwork from her PCP about the low sodium. This may be related to the oxcarbazepine we added on. Has she had any seizures? If none, pls instruct her to reduce oxcarbazepine 300mg : Take 1.5 tabs BID. It looks like sodium levels will be rechecked by her PCP soon, let's see if any improvement on lower dose. Thanks

## 2018-04-07 ENCOUNTER — Ambulatory Visit (HOSPITAL_COMMUNITY): Payer: Self-pay | Admitting: Licensed Clinical Social Worker

## 2018-04-12 ENCOUNTER — Ambulatory Visit (HOSPITAL_COMMUNITY): Payer: PPO | Admitting: Licensed Clinical Social Worker

## 2018-04-12 ENCOUNTER — Encounter (HOSPITAL_COMMUNITY): Payer: Self-pay | Admitting: Licensed Clinical Social Worker

## 2018-04-12 DIAGNOSIS — F3341 Major depressive disorder, recurrent, in partial remission: Secondary | ICD-10-CM

## 2018-04-12 NOTE — Progress Notes (Signed)
   THERAPIST PROGRESS NOTE  Session Time: 9:10-11am  Participation Level: Active  Behavioral Response: CasualAlert/Euthymic  Type of Therapy: Individual Therapy  Treatment Goals addressed: Coping  Interventions: CBT  Summary: Marchell Froman is a 42 y.o. female who presents for her  individual counseling session. Pt discussed her psychiatric symptoms and current life events. Pt presented wnl today. She reports her medications seem to be working and her moods are stable today. She has been feeling overwhelmed with life (pending mediation, children home for summer, mother has cancer, pt has some issues with low sodium, dealing with her xhusband continually. Discussed how all of these stressors are affecting her. Pt reports her chest has been hurting when her anxiety become overwhelming. Pt reports that the breathing exercises have been helping during these stressful times. Pt and her current husband are in limbo and have not followed through with marriage counseling. Asked open ended questions and used empathic reflection. Gave pt handout on breathing exercises.    Suicidal/Homicidal: Nowithout intent/plan  Therapist Response: Assessed pt's current functioning by self report and reviewed progress. Assisted pt processing her anxiety symptoms, meditation, feelings of being overwhelmed, mother's cancer dx. (all stressors).  Assisted pt processing for the management of her stressors.  Plan: Return again in 2 weeks   Diagnosis: Axis I: MDD         Alithea Lapage S, LCAS 04/12/2018

## 2018-04-13 ENCOUNTER — Ambulatory Visit (HOSPITAL_COMMUNITY): Payer: Self-pay | Admitting: Licensed Clinical Social Worker

## 2018-04-14 DIAGNOSIS — E871 Hypo-osmolality and hyponatremia: Secondary | ICD-10-CM | POA: Diagnosis not present

## 2018-04-20 ENCOUNTER — Ambulatory Visit (HOSPITAL_COMMUNITY): Payer: Self-pay | Admitting: Licensed Clinical Social Worker

## 2018-04-26 ENCOUNTER — Ambulatory Visit (HOSPITAL_COMMUNITY): Payer: Self-pay | Admitting: Licensed Clinical Social Worker

## 2018-04-27 ENCOUNTER — Ambulatory Visit (HOSPITAL_COMMUNITY): Payer: Self-pay | Admitting: Licensed Clinical Social Worker

## 2018-05-04 ENCOUNTER — Ambulatory Visit (HOSPITAL_COMMUNITY): Payer: Self-pay | Admitting: Licensed Clinical Social Worker

## 2018-05-04 ENCOUNTER — Ambulatory Visit (HOSPITAL_COMMUNITY): Payer: PPO | Admitting: Licensed Clinical Social Worker

## 2018-05-04 ENCOUNTER — Encounter (HOSPITAL_COMMUNITY): Payer: Self-pay | Admitting: Licensed Clinical Social Worker

## 2018-05-04 DIAGNOSIS — F332 Major depressive disorder, recurrent severe without psychotic features: Secondary | ICD-10-CM | POA: Diagnosis not present

## 2018-05-04 DIAGNOSIS — F419 Anxiety disorder, unspecified: Secondary | ICD-10-CM | POA: Diagnosis not present

## 2018-05-04 NOTE — Progress Notes (Signed)
   THERAPIST PROGRESS NOTE  Session Time: 9:10-10am  Participation Level: Active  Behavioral Response: CasualAlert/Euthymic  Type of Therapy: Individual Therapy  Treatment Goals addressed: Coping  Interventions: CBT  Summary: Ashley [name] is a 42 y.o. female who presents for her  individual counseling session. Pt discussed her psychiatric symptoms and current life events. Pt reports her mood seems stabilized currently but she is overall exhausted. Spoke at length with pt about self care. Asked open ended questions about what self care looks like. Educated pt on exhaustion and effects on the body. Reminded pt she is a stroke survivor. Pt reports she is also have symptoms of feeling light headed and delirious at night. Suggested to pt to contact her neurologist about the symptoms. Asked pt open ended questions: Who will do all that you do if you are not here to do it? Very serious question to discuss and then think about.      Suicidal/Homicidal: Nowithout intent/plan  Therapist Response: Assessed pt's current functioning by self report and reviewed progress. Assisted pt processing lack of self care, benefits of self care, risks of not using self care.  Assisted pt processing for the management of her stressors.  Plan: Return again in 2 weeks   Diagnosis: Axis I: MDD         Jenkins Rouge, LCAS 05/04/2018

## 2018-05-11 ENCOUNTER — Ambulatory Visit (HOSPITAL_COMMUNITY): Payer: Self-pay | Admitting: Licensed Clinical Social Worker

## 2018-05-11 DIAGNOSIS — E871 Hypo-osmolality and hyponatremia: Secondary | ICD-10-CM | POA: Diagnosis not present

## 2018-05-11 DIAGNOSIS — Z6820 Body mass index (BMI) 20.0-20.9, adult: Secondary | ICD-10-CM | POA: Diagnosis not present

## 2018-05-11 DIAGNOSIS — R42 Dizziness and giddiness: Secondary | ICD-10-CM | POA: Diagnosis not present

## 2018-05-13 ENCOUNTER — Other Ambulatory Visit: Payer: Self-pay

## 2018-05-13 MED ORDER — OXCARBAZEPINE ER 300 MG PO TB24: 900.0000 mg | ORAL_TABLET | Freq: Every day | ORAL | 11 refills | Status: DC

## 2018-05-18 ENCOUNTER — Encounter (HOSPITAL_COMMUNITY): Payer: Self-pay | Admitting: Licensed Clinical Social Worker

## 2018-05-18 ENCOUNTER — Ambulatory Visit (HOSPITAL_COMMUNITY): Payer: PPO | Admitting: Licensed Clinical Social Worker

## 2018-05-18 ENCOUNTER — Telehealth: Payer: Self-pay | Admitting: Neurology

## 2018-05-18 DIAGNOSIS — F3341 Major depressive disorder, recurrent, in partial remission: Secondary | ICD-10-CM

## 2018-05-18 NOTE — Telephone Encounter (Signed)
Patient called and left a voicemail wanting to know if they could change their seizure medication and get on a different medication. Please call her back at [phone]. Thanks.

## 2018-05-18 NOTE — Progress Notes (Signed)
   THERAPIST PROGRESS NOTE  Session Time: 11:10-12pm  Participation Level: Active  Behavioral Response: CasualAlert/Agitated  Type of Therapy: Individual Therapy  Treatment Goals addressed: Coping  Interventions: CBT  Summary: Ashley [name] is a 42 y.o. female who presents for her  individual counseling session. Pt discussed her psychiatric symptoms and current life events. Pt reports her mood seems stabilized currently but she is overall exhausted. She presented agitated and in her overall appearance she did not look healthy. She looks as if she has lost more weight, her face looked very thin and she looked stressed. Pt shared she went to the beach over the weekend and it was not restful. She is overwhelmed from the summer at home with 3 children. At this point seriously spoke with pt about self care. She says she understands the importance of self care but her children have to come first. It almost seemed obsessive. She claims she feels defeated in her marriage. They don't communicate, just live in the same house. Asked open ended questions about marriage counseling and problem solving. Role played how to talk to her husband, problem solve and communicate effectively.        Suicidal/Homicidal: Nowithout intent/plan  Therapist Response: Assessed pt's current functioning by self report and reviewed progress. Assisted pt processing lack of self care, benefits of self care, risks of not using self care, communication skills. Assisted pt processing for the management of her stressors.  Plan: Return again in 2 weeks   Diagnosis: Axis I: MDD         Ashley [name] S, LCAS 05/18/2018

## 2018-05-18 NOTE — Telephone Encounter (Signed)
Is this something Denice Bors can work on again?

## 2018-05-18 NOTE — Telephone Encounter (Signed)
Spoke with pt.  She was a little confused about her medications, asking if she was suppose to increase one while decreasing another.  She stated that the medication that was called in was a completely different medication than she was taking.  Explained to pt that Trileptal and Oxtellar XR are both Oxcarbazepine and that she should not take them both at the same time.  Relayed again instructions from previous message.  Pt then stated that her co-pay with Trileptal was only $5 where Oxtellar is going to cost roughly $100/month.  Pt stated that she could afford that, but was curious if there was a more cost effective medication.  Let pt know that I would send message to Dr. Delice Lesch and will return call to pt tomorrow with any recommendations.

## 2018-05-26 DIAGNOSIS — E871 Hypo-osmolality and hyponatremia: Secondary | ICD-10-CM | POA: Diagnosis not present

## 2018-06-01 ENCOUNTER — Ambulatory Visit (INDEPENDENT_AMBULATORY_CARE_PROVIDER_SITE_OTHER): Payer: PPO | Admitting: Licensed Clinical Social Worker

## 2018-06-01 ENCOUNTER — Encounter (HOSPITAL_COMMUNITY): Payer: Self-pay | Admitting: Licensed Clinical Social Worker

## 2018-06-01 DIAGNOSIS — F332 Major depressive disorder, recurrent severe without psychotic features: Secondary | ICD-10-CM

## 2018-06-01 NOTE — Progress Notes (Signed)
   THERAPIST PROGRESS NOTE  Session Time: 11:10-12pm  Participation Level: Active  Behavioral Response: CasualAlert/Agitated  Type of Therapy: Individual Therapy  Treatment Goals addressed: Coping  Interventions: CBT  Summary: Ashley [name] is a 42 y.o. female who presents for her  individual counseling session. Pt discussed her psychiatric symptoms and current life events. Pt reports she has been having some depressive days and anxiety. She reports she has had a rough summer, running all over town taking care of her 3 kids. She has had little time for herself. Discussed with her the importance of self care. I am concerned about her health and well being. Pt also feels hopeless about her marriage and stresses about it. Asked open ended questions. Asked pt what she wants to do with her life, how can she be happy.       Suicidal/Homicidal: Nowithout intent/plan  Therapist Response: Assessed pt's current functioning by self report and reviewed progress. Assisted pt processing lack of self care, benefits of self care, risks of not using self care, marital issues. Assisted pt processing for the management of her stressors.  Plan: Return again in 2 weeks   Diagnosis: Axis I: MDD         [name],[name], LCAS 06/01/2018

## 2018-06-08 ENCOUNTER — Ambulatory Visit (HOSPITAL_COMMUNITY): Payer: PPO | Admitting: Psychiatry

## 2018-06-08 ENCOUNTER — Encounter (HOSPITAL_COMMUNITY): Payer: Self-pay | Admitting: Psychiatry

## 2018-06-08 DIAGNOSIS — F3341 Major depressive disorder, recurrent, in partial remission: Secondary | ICD-10-CM

## 2018-06-08 MED ORDER — FLUOXETINE HCL 20 MG PO CAPS: 20.0000 mg | ORAL_CAPSULE | Freq: Every day | ORAL | 1 refills | Status: DC

## 2018-06-08 MED ORDER — MIRTAZAPINE 30 MG PO TABS: 30.0000 mg | ORAL_TABLET | Freq: Every day | ORAL | 0 refills | Status: DC

## 2018-06-08 MED ORDER — FLUOXETINE HCL 40 MG PO CAPS: 40.0000 mg | ORAL_CAPSULE | Freq: Every day | ORAL | 0 refills | Status: DC

## 2018-06-08 NOTE — Patient Instructions (Addendum)
[email or web address] [address] [place], [place] [address] Phone - [phone] Fax [phone] Follow up in 3 months

## 2018-06-08 NOTE — Progress Notes (Signed)
[facility] MD/PA/NP OP Progress Note  06/08/2018 10:46 AM Merideth Bosque  MRN:  [id number]  Chief Complaint: doing well HPI: Ashley [name] Patient presents table mood and anxiety, sleeping well.  Feels like this medication does its job appropriately for her current symptoms.  We will follow-up in 3 months.  Visit Diagnosis:    ICD-10-CM   1. Recurrent major depressive disorder, in partial remission (HCC) F33.41 FLUoxetine (PROZAC) 20 MG capsule    FLUoxetine (PROZAC) 40 MG capsule    mirtazapine (REMERON) 30 MG tablet    Past Psychiatric History: See intake H&P for full details. Reviewed, with no updates at this time.   Past Medical History:  Past Medical History:  Diagnosis Date  . Anxiety   . Depression   . ICH (intracerebral hemorrhage) ([place])   . Seizures ([place])   . Stroke [facility])     Past Surgical History:  Procedure Laterality Date  . DILATION AND CURETTAGE OF UTERUS      Family Psychiatric History: See intake H&P for full details. Reviewed, with no updates at this time.   Family History:  Family History  Problem Relation Age of Onset  . Cancer Mother   . Cancer Father        pancreatic    Social History:  Social History   Socioeconomic History  . Marital status: Married    Spouse name: Not on file  . Number of children: Not on file  . Years of education: Not on file  . Highest education level: Not on file  Occupational History  . Not on file  Social Needs  . Financial resource strain: Not on file  . Food insecurity:    Worry: Not on file    Inability: Not on file  . Transportation needs:    Medical: Not on file    Non-medical: Not on file  Tobacco Use  . Smoking status: Never Smoker  . Smokeless tobacco: Never Used  Substance and Sexual Activity  . Alcohol use: Yes    Alcohol/week: 1.0 standard drinks    Types: 1 Glasses of wine per week    Comment: casual   . Drug use: No  . Sexual activity: Yes    Birth control/protection: None    Comment: husband has  vasectomy  Lifestyle  . Physical activity:    Days per week: Not on file    Minutes per session: Not on file  . Stress: Not on file  Relationships  . Social connections:    Talks on phone: Not on file    Gets together: Not on file    Attends religious service: Not on file    Active member of club or organization: Not on file    Attends meetings of clubs or organizations: Not on file    Relationship status: Not on file  Other Topics Concern  . Not on file  Social History Narrative  . Not on file    Allergies: No Known Allergies  Metabolic Disorder Labs: Lab Results  Component Value Date   HGBA1C 5.1 02/07/2016   No results found for: PROLACTIN Lab Results  Component Value Date   CHOL 194 02/07/2016   TRIG 45 02/07/2016   HDL 90 02/07/2016   CHOLHDL 2.2 02/07/2016   LDLCALC 95 02/07/2016   No results found for: TSH  Therapeutic Level Labs: No results found for: LITHIUM Lab Results  Component Value Date   VALPROATE <10 (L) 06/01/2015   No components found for:  CBMZ  Current  Medications: Current Outpatient Medications  Medication Sig Dispense Refill  . aspirin 81 MG chewable tablet Chew 81 mg by mouth daily.    . Cholecalciferol (VITAMIN D) 2000 units CAPS Take 2,000 Units by mouth daily.    Marland Kitchen FLUoxetine (PROZAC) 20 MG capsule Take 1 capsule (20 mg total) by mouth daily. Take with 40 mg for total of 60 mg daily 90 capsule 1  . FLUoxetine (PROZAC) 40 MG capsule Take 1 capsule (40 mg total) by mouth daily. 90 capsule 0  . levETIRAcetam (KEPPRA) 750 MG tablet Take 2 tablets (1,500 mg total) by mouth 2 (two) times daily. 120 tablet 11  . LORazepam (ATIVAN) 1 MG tablet Take 1 tablet as needed for seizure 10 tablet 3  . mirtazapine (REMERON) 30 MG tablet Take 1 tablet (30 mg total) by mouth at bedtime. 90 tablet 0  . Multiple Vitamin (MULTIVITAMIN) tablet Take 1 tablet by mouth daily.     . Oxcarbazepine (TRILEPTAL) 300 MG tablet take 2 tablets twice a day 120 tablet 6   . OXcarbazepine ER (OXTELLAR XR) 300 MG TB24 Take 900 mg by mouth at bedtime. 90 tablet 11  . Sulfamethoxazole-Trimethoprim (BACTRIM PO) Take 160 mg by mouth daily.     No current facility-administered medications for this visit.    Facility-Administered Medications Ordered in Other Visits  Medication Dose Route Frequency Provider Last Rate Last Dose  . gadopentetate dimeglumine (MAGNEVIST) injection 12 mL  12 mL Intravenous Once PRN Garvin Fila, MD         Musculoskeletal: Strength & Muscle Tone: within normal limits Gait & Station: normal Patient leans: N/A  Psychiatric Specialty Exam: ROS  There were no vitals taken for this visit.There is no height or weight on file to calculate BMI.  General Appearance: Casual and Fairly Groomed  Eye Contact:  Good  Speech:  Clear and Coherent and Normal Rate  Volume:  Normal  Mood:  Euthymic  Affect:  Appropriate and Congruent  Thought Process:  Coherent and Descriptions of Associations: Intact  Orientation:  Full (Time, Place, and Person)  Thought Content: Logical   Suicidal Thoughts:  No  Homicidal Thoughts:  No  Memory:  Immediate;   Good  Judgement:  Good  Insight:  Good  Psychomotor Activity:  Normal  Concentration:  Attention Span: Good  Recall:  Good  Fund of Knowledge: Good  Language: Good  Akathisia:  Negative  Handed:  Right  AIMS (if indicated): not done  Assets:  Communication Skills Desire for Improvement Financial Resources/Insurance Housing  ADL's:  Intact  Cognition: WNL  Sleep:  Good   Screenings: PHQ2-9     [facility]  PHQ-2 Total Score  2  6  2   PHQ-9 Total Score  -  -  8       Assessment and Plan:  Leshay Desaulniers presents as stable on the current medication regimen.  We will continue routine medication management as  below.  1. Recurrent major depressive disorder, in partial remission ([place])     Status of current problems: stable  Labs Ordered: No orders of the defined types were placed in this encounter.   Labs Reviewed: n/a  Collateral Obtained/Records Reviewed: n/a  Plan:  Continue Prozac 60 mg daily and Remeron 30 mg nightly Return to clinic in 3 months   Aundra Dubin, MD 06/08/2018,  10:46 AM

## 2018-06-09 ENCOUNTER — Ambulatory Visit (HOSPITAL_COMMUNITY): Payer: Self-pay | Admitting: Licensed Clinical Social Worker

## 2018-06-15 ENCOUNTER — Encounter (HOSPITAL_COMMUNITY): Payer: Self-pay | Admitting: Licensed Clinical Social Worker

## 2018-06-15 ENCOUNTER — Ambulatory Visit (INDEPENDENT_AMBULATORY_CARE_PROVIDER_SITE_OTHER): Payer: PPO | Admitting: Licensed Clinical Social Worker

## 2018-06-15 DIAGNOSIS — F3341 Major depressive disorder, recurrent, in partial remission: Secondary | ICD-10-CM | POA: Diagnosis not present

## 2018-06-15 NOTE — Progress Notes (Signed)
   THERAPIST PROGRESS NOTE  Session Time: 10:10-11am  Participation Level: Active  Behavioral Response: CasualAlert/Agitated  Type of Therapy: Individual Therapy  Treatment Goals addressed: Coping  Interventions: CBT  Summary: Onetta Spainhower is a 42 y.o. female who presents for her  individual counseling session. Pt discussed her psychiatric symptoms and current life events. Pt reports her children are back in school and her life has calmed down some. Discussed this is a critical time for her to begin her own self care since she is not home daily with her 3 children. Pt keeps herself immensely busy daily taking care of everyone in the house but herself. She reports few days of depression. She has days of stress and anxiety but can usually handle it.  During the session she had a "moment." She was awake but non responsive but was doing her breathing exercises. When I asked her what she needed. She said she couldn't hear. After 2-3 minutes she was more aware. She reported she was very sleepy, which she never is. I asked CMA Regan to come into the session as there was no RN on site. CMA had her come into her office to check her bp and pulse and requested she contact her PCP.      Suicidal/Homicidal: Nowithout intent/plan  Therapist Response: Assessed pt's current functioning by self report and reviewed progress. Assisted pt processing lack of self care, benefits of self care, risks of not using self care. Assisted pt processing for the management of her stressors.  Plan: Return again in 2 weeks   Diagnosis: Axis I: [place]         Charice Zuno S, LCAS 06/15/2018

## 2018-06-28 ENCOUNTER — Ambulatory Visit (INDEPENDENT_AMBULATORY_CARE_PROVIDER_SITE_OTHER): Payer: PPO | Admitting: Licensed Clinical Social Worker

## 2018-06-28 ENCOUNTER — Encounter (HOSPITAL_COMMUNITY): Payer: Self-pay | Admitting: Licensed Clinical Social Worker

## 2018-06-28 DIAGNOSIS — F419 Anxiety disorder, unspecified: Secondary | ICD-10-CM | POA: Diagnosis not present

## 2018-06-28 NOTE — Progress Notes (Signed)
   THERAPIST PROGRESS NOTE  Session Time: 9:30-10:20am  Participation Level: Active  Behavioral Response: CasualAlert/Anxious  Type of Therapy: Individual Therapy  Treatment Goals addressed: Coping  Interventions: CBT  Summary: Ashley [name] is a 42 y.o. female who presents for her  individual counseling session. Pt discussed her psychiatric symptoms and current life events. Pt presented 30 minutes late for her appointment. She had so much she needed/wanted to accomplish this  Morning that she was late. Discussed her routine and it's affects on her anxiety.  Educated pt on coping tools to assist with her anxiety. Explained to pt how she is restricting herself by putting herself on a time table which increases her anxiety. Educated pt on the effects of anxiety on her physical health.       Suicidal/Homicidal: Nowithout intent/plan  Therapist Response: Assessed pt's current functioning by self report and reviewed progress. Assisted pt processing routine affects her physical health and anxiety. Assisted pt processing for the management of her stressors.  Plan: Return again in 2 weeks   Diagnosis: Axis I: [place], LCAS 06/28/2018

## 2018-06-30 ENCOUNTER — Ambulatory Visit (HOSPITAL_COMMUNITY): Payer: Self-pay | Admitting: Licensed Clinical Social Worker

## 2018-06-30 ENCOUNTER — Encounter

## 2018-07-05 DIAGNOSIS — S0990XA Unspecified injury of head, initial encounter: Secondary | ICD-10-CM | POA: Diagnosis not present

## 2018-07-05 DIAGNOSIS — I69398 Other sequelae of cerebral infarction: Secondary | ICD-10-CM | POA: Diagnosis not present

## 2018-07-05 DIAGNOSIS — I959 Hypotension, unspecified: Secondary | ICD-10-CM | POA: Diagnosis not present

## 2018-07-05 DIAGNOSIS — E876 Hypokalemia: Secondary | ICD-10-CM | POA: Diagnosis not present

## 2018-07-05 DIAGNOSIS — R404 Transient alteration of awareness: Secondary | ICD-10-CM | POA: Diagnosis not present

## 2018-07-05 DIAGNOSIS — R0902 Hypoxemia: Secondary | ICD-10-CM | POA: Diagnosis not present

## 2018-07-05 DIAGNOSIS — G9389 Other specified disorders of brain: Secondary | ICD-10-CM | POA: Diagnosis not present

## 2018-07-05 DIAGNOSIS — I252 Old myocardial infarction: Secondary | ICD-10-CM | POA: Diagnosis not present

## 2018-07-05 DIAGNOSIS — G40801 Other epilepsy, not intractable, with status epilepticus: Secondary | ICD-10-CM | POA: Diagnosis not present

## 2018-07-05 DIAGNOSIS — I69198 Other sequelae of nontraumatic intracerebral hemorrhage: Secondary | ICD-10-CM | POA: Diagnosis not present

## 2018-07-05 DIAGNOSIS — Z4682 Encounter for fitting and adjustment of non-vascular catheter: Secondary | ICD-10-CM | POA: Diagnosis not present

## 2018-07-05 DIAGNOSIS — R269 Unspecified abnormalities of gait and mobility: Secondary | ICD-10-CM | POA: Diagnosis not present

## 2018-07-05 DIAGNOSIS — F329 Major depressive disorder, single episode, unspecified: Secondary | ICD-10-CM | POA: Diagnosis not present

## 2018-07-05 DIAGNOSIS — G40901 Epilepsy, unspecified, not intractable, with status epilepticus: Secondary | ICD-10-CM | POA: Diagnosis not present

## 2018-07-05 DIAGNOSIS — F419 Anxiety disorder, unspecified: Secondary | ICD-10-CM | POA: Diagnosis not present

## 2018-07-05 DIAGNOSIS — Z79899 Other long term (current) drug therapy: Secondary | ICD-10-CM | POA: Diagnosis not present

## 2018-07-05 DIAGNOSIS — Z8673 Personal history of transient ischemic attack (TIA), and cerebral infarction without residual deficits: Secondary | ICD-10-CM | POA: Diagnosis not present

## 2018-07-05 DIAGNOSIS — G40909 Epilepsy, unspecified, not intractable, without status epilepticus: Secondary | ICD-10-CM | POA: Diagnosis not present

## 2018-07-05 DIAGNOSIS — J9690 Respiratory failure, unspecified, unspecified whether with hypoxia or hypercapnia: Secondary | ICD-10-CM | POA: Diagnosis not present

## 2018-07-05 DIAGNOSIS — Z9911 Dependence on respirator [ventilator] status: Secondary | ICD-10-CM | POA: Diagnosis not present

## 2018-07-05 DIAGNOSIS — R000 Tachycardia, unspecified: Secondary | ICD-10-CM | POA: Diagnosis not present

## 2018-07-05 DIAGNOSIS — R569 Unspecified convulsions: Secondary | ICD-10-CM | POA: Diagnosis not present

## 2018-07-05 DIAGNOSIS — L709 Acne, unspecified: Secondary | ICD-10-CM | POA: Diagnosis not present

## 2018-07-08 ENCOUNTER — Telehealth: Payer: Self-pay | Admitting: Neurology

## 2018-07-08 NOTE — Telephone Encounter (Signed)
Pt returned my call.  States that she was at her son's soccer game in [place] and had a seizure.  Was taken to [place] [facility].  States that her sodium and magnesium are still low and Dr at [facility] would like her to switch her seizure medications because of this.  Pt could not give me much more information.  Please advise.

## 2018-07-08 NOTE — Telephone Encounter (Signed)
Patient called and is needing to let Dr. Delice Lesch know that she has been in the hospital and that they are wanting one of her Seizure medications changed. Please Call. She is scheduled for 07/20/2018 and on a wait list. Thanks

## 2018-07-08 NOTE — Telephone Encounter (Signed)
Returned call to pt.  No answer.  LMOM asking for return call.  

## 2018-07-08 NOTE — Telephone Encounter (Signed)
Request for recent lab results faxed to [phone]

## 2018-07-09 DIAGNOSIS — Z23 Encounter for immunization: Secondary | ICD-10-CM | POA: Diagnosis not present

## 2018-07-09 DIAGNOSIS — E871 Hypo-osmolality and hyponatremia: Secondary | ICD-10-CM | POA: Diagnosis not present

## 2018-07-09 DIAGNOSIS — F418 Other specified anxiety disorders: Secondary | ICD-10-CM | POA: Diagnosis not present

## 2018-07-09 DIAGNOSIS — R569 Unspecified convulsions: Secondary | ICD-10-CM | POA: Diagnosis not present

## 2018-07-09 DIAGNOSIS — Z6821 Body mass index (BMI) 21.0-21.9, adult: Secondary | ICD-10-CM | POA: Diagnosis not present

## 2018-07-12 ENCOUNTER — Telehealth: Payer: Self-pay

## 2018-07-12 ENCOUNTER — Other Ambulatory Visit: Payer: Self-pay | Admitting: *Deleted

## 2018-07-12 NOTE — Telephone Encounter (Signed)
Rec'd from [facility] forwarded 10 pages to Neurology Historical Provider

## 2018-07-12 NOTE — Patient Outreach (Signed)
[facility]) Care Management  07/12/2018  Ashley [name] 06-23-1976 [id number]   Transition of Care Referral   Referral Date: 07/08/18 Referral Source: [facility] IP discharges  Date of Admission: 07/05/18 Diagnosis: Seizure disorder s/p remote hemorrhagic CVA, Hypomagnesemia Date of Discharge: 07/07/18 Facility:  [place]: HTA   Outreach attempt # 1 CM reached the patient but she states this is not a good time She is "at my son's football game." CM left CM contact numbers (office and mobile for a return call    Plan: [facility] RN CM sent an unsuccessful outreach letter and scheduled this patient for another call attempt within 4 business days   [place] L. Lavina Hamman, RN, BSN, [place] Management Care Coordinator Direct Number [phone] Mobile number [phone]  Main [facility] number [phone] Fax number [phone]

## 2018-07-14 ENCOUNTER — Ambulatory Visit (INDEPENDENT_AMBULATORY_CARE_PROVIDER_SITE_OTHER): Payer: PPO | Admitting: Licensed Clinical Social Worker

## 2018-07-14 ENCOUNTER — Telehealth: Payer: Self-pay | Admitting: Neurology

## 2018-07-14 ENCOUNTER — Encounter (HOSPITAL_COMMUNITY): Payer: Self-pay | Admitting: Licensed Clinical Social Worker

## 2018-07-14 ENCOUNTER — Other Ambulatory Visit: Payer: Self-pay | Admitting: *Deleted

## 2018-07-14 DIAGNOSIS — F419 Anxiety disorder, unspecified: Secondary | ICD-10-CM | POA: Diagnosis not present

## 2018-07-14 NOTE — Telephone Encounter (Signed)
Pls let her know I reviewed the records and her magnesium level was low but the sodium level was actually not low. We can certainly change her medication but would do it in the office. Do I still have the 8:30am opening on Friday? She has a f/u with me next week otherwise. Thanks

## 2018-07-14 NOTE — Telephone Encounter (Signed)
Patient stated she was awaiting a return call from last week about a seizure she had. She wanted to talk about changing her medication. Please call her back at [phone]. Thanks!

## 2018-07-14 NOTE — Patient Outreach (Signed)
[facility]) Care Management  07/14/2018  [name] June 11, 1976 [id number]   Transition of Care Referral  Referral Date: 07/08/18 Referral Source: [facility] IP discharges  Date of Admission: 07/05/18 Diagnosis: Seizure disorder s/p remote hemorrhagic CVA, Hypomagnesemia Date of Discharge: 07/07/18 Facility:  [place]: HTA   Outreach attempt # 2 No answer. [facility] RN CM left HIPAA compliant voicemail message along with CM's contact info.   Plan: [facility] RN CM scheduled this patient for third/last call attempt within 4 business days   Ashley Felix L. Lavina Hamman, RN, BSN, [place] Management Care Coordinator Direct Number [phone] Mobile number [phone]  Main [facility] number [phone] Fax number [phone]

## 2018-07-14 NOTE — Telephone Encounter (Signed)
Her hospital notes came in today.

## 2018-07-14 NOTE — Progress Notes (Signed)
   THERAPIST PROGRESS NOTE  Session Time: 9:30-10:20am  Participation Level: Active  Behavioral Response: CasualAlert/Anxious  Type of Therapy: Individual Therapy  Treatment Goals addressed: Coping  Interventions: CBT  Summary: Raianna Slight is a 42 y.o. female who presents for her  individual counseling session. Pt discussed her psychiatric symptoms and current life events. Pt shared she had a seizure a week ago at her son's football game and ended up at [facility] hospital for 2 days. She was "worked up" about her son being left without a ride at school before the seizure. Educated pt on mindfulness skills and the necessity to care for herself. She was very docile today and reports she is taking magnesium now. She shared her mother also noticed her change in behavior. Suggested pt call her neurologist; she left them a message 6 days ago and they have not returned her call. Processed with pt the importance of following through. Her PCP started her on Xanax, as needed. Suggested that she call her psychiatrist, Dr. Daron Offer, at his office for change in meds. Pt can not drive for the next  Months and she is now episodically depressed. Suggested to pt to think of all her gratitudes that she has with people who are willing to help her.   Suicidal/Homicidal: Nowithout intent/plan  Therapist Response: Assessed pt's current functioning by self report and reviewed progress. Assisted pt processing her seizure, after affects, follow through with medical, gratitudes. Assisted pt processing for the management of her stressors.  Plan: Return again in 2 weeks   Diagnosis: Axis I: [place], LCAS 07/14/2018

## 2018-07-14 NOTE — Telephone Encounter (Signed)
Spoke with pt relaying message below.  She would like to come in on Friday.  Appointment rescheduled for Friday, October 4 @ 8:30AM

## 2018-07-16 ENCOUNTER — Ambulatory Visit (INDEPENDENT_AMBULATORY_CARE_PROVIDER_SITE_OTHER): Payer: PPO | Admitting: Neurology

## 2018-07-16 ENCOUNTER — Encounter: Payer: Self-pay | Admitting: Neurology

## 2018-07-16 ENCOUNTER — Other Ambulatory Visit: Payer: Self-pay

## 2018-07-16 ENCOUNTER — Other Ambulatory Visit: Payer: Self-pay | Admitting: *Deleted

## 2018-07-16 VITALS — BP 98/62 | HR 82 | Ht 63.0 in | Wt 115.0 lb

## 2018-07-16 DIAGNOSIS — G40109 Localization-related (focal) (partial) symptomatic epilepsy and epileptic syndromes with simple partial seizures, not intractable, without status epilepticus: Secondary | ICD-10-CM

## 2018-07-16 DIAGNOSIS — Z1231 Encounter for screening mammogram for malignant neoplasm of breast: Secondary | ICD-10-CM | POA: Diagnosis not present

## 2018-07-16 DIAGNOSIS — I69359 Hemiplegia and hemiparesis following cerebral infarction affecting unspecified side: Secondary | ICD-10-CM | POA: Diagnosis not present

## 2018-07-16 DIAGNOSIS — Z124 Encounter for screening for malignant neoplasm of cervix: Secondary | ICD-10-CM | POA: Diagnosis not present

## 2018-07-16 DIAGNOSIS — Z6820 Body mass index (BMI) 20.0-20.9, adult: Secondary | ICD-10-CM | POA: Diagnosis not present

## 2018-07-16 DIAGNOSIS — Z01419 Encounter for gynecological examination (general) (routine) without abnormal findings: Secondary | ICD-10-CM | POA: Diagnosis not present

## 2018-07-16 MED ORDER — LEVETIRACETAM 750 MG PO TABS: 1500.0000 mg | ORAL_TABLET | Freq: Two times a day (BID) | ORAL | 11 refills | Status: DC

## 2018-07-16 MED ORDER — OXCARBAZEPINE 300 MG PO TABS: ORAL_TABLET | ORAL | 6 refills | Status: DC

## 2018-07-16 MED ORDER — LAMOTRIGINE 25 MG PO TABS: ORAL_TABLET | ORAL | 0 refills | Status: DC

## 2018-07-16 MED ORDER — LAMOTRIGINE 100 MG PO TABS: ORAL_TABLET | ORAL | 11 refills | Status: DC

## 2018-07-16 NOTE — Patient Instructions (Signed)
1. Start Lamotrigine 25mg : take 1 tablet twice a day for 2 weeks (25mg  twice a day), then increase to 2 tablets twice a day for 2 weeks (50mg  twice a day), then switch to Lamotrigine 100mg : take 1 tablet twice a day (100mg  twice a day)  2. Continue Keppra 750mg : take 2 tablets twice a day  3. Continue Trileptal 300mg  every night until you are taking the highest dose of lamotrigine, then stop Trileptal  4. Follow-up in 3 months, call for any changes  Seizure Precautions: 1. If medication has been prescribed for you to prevent seizures, take it exactly as directed.  Do not stop taking the medicine without talking to your doctor first, even if you have not had a seizure in a long time.   2. Avoid activities in which a seizure would cause danger to yourself or to others.  Don't operate dangerous machinery, swim alone, or climb in high or dangerous places, such as on ladders, roofs, or girders.  Do not drive unless your doctor says you may.  3. If you have any warning that you may have a seizure, lay down in a safe place where you can't hurt yourself.    4.  No driving for 6 months from last seizure, as per [facility].   Please refer to the following link on the [place] website for more information: http://www.epilepsyfoundation.org/answerplace/Social/driving/drivingu.cfm   5.  Maintain good sleep hygiene. Avoid alcohol.  6.  Notify your neurology if you are planning pregnancy or if you become pregnant.  7.  Contact your doctor if you have any problems that may be related to the medicine you are taking.  8.  Call 911 and bring the patient back to the ED if:        A.  The seizure lasts longer than 5 minutes.       B.  The patient doesn't awaken shortly after the seizure  C.  The patient has new problems such as difficulty seeing, speaking or moving  D.  The patient was injured during the seizure  E.  The patient has a temperature over 102 F (39C)  F.   The patient vomited and now is having trouble breathing

## 2018-07-16 NOTE — Patient Outreach (Signed)
[name]) Care Management  07/16/2018  Ashley [name] Feb 02, 1976 [id number]   Transition of Care Referral  Referral Date:07/08/18 Referral Source:[facility] IP discharges Date of Admission:07/05/18 Diagnosis:Seizure disorder s/p remote hemorrhagic CVA,Hypomagnesemia Date of Discharge:07/07/18 [place]  Outreach attempt #3 No answer. [facility] RN CM left HIPAA compliant voicemail message along with CM's contact info.   Plan: [facility] RN CM scheduled this patient case closure within 4 business days   Kimberly L. Lavina Hamman, RN, BSN, [place] Management Care Coordinator Direct Number [phone] Mobile number [phone]  Main [facility] number [phone] Fax number [phone]

## 2018-07-16 NOTE — Progress Notes (Signed)
NEUROLOGY FOLLOW UP OFFICE NOTE  Terricka Onofrio [id number] October 28, 1975  HISTORY OF PRESENT ILLNESS: I had the pleasure of seeing Lis Savitt in follow-up in the neurology clinic on 07/16/2018. The patient was last seen 6 months ago for seizures. She is again accompanied by her husband who helps supplement the history today. Since her last visit, she had contacted our office about hyponatremia since starting oxcarbazepine. Na 125 in May 2019, 126 in June 2019. Dose reduced to 450mg  BID in June 2019. She also takes Keppra 1500mg  BID. She had been doing well with no seizures for almost a year, until she had a breakthrough seizure on 07/05/18, she reports she was very stressed that day, and while in the car with her husband started having right arm jerking. This quieted down, but later on she had another episode followed by a generalized convulsion. She continued to have right-sided clonic activity and was intubated and sedated on Propofol. She had an EEG which did not show any ongoing seizures, no epileptiform activity seen, there was focal slowing over the left central posterior region. She was noted to be hypotensive and required phenylephrine and a dose of Solu-cortef. Her sodium level was normal on admission and discharge (138 and 142, respectively), magnesium slightly low 1.6, she received supplementation, normalized to 2.0 on discharge. She thinks she was not given the Trileptal while in the hospital, and states she was discharged back on Keppra 1500mg  BID and Trileptal 300mg  qhs. She denies any headaches, dizziness, diplopia, worsened right-sided weakness. She now has prn Xanax to take for stressful days.   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 She had focal seizures in 2016 with EEG showing left PLEDs and has been on Keppra 1500mg  BID since then with no further focal  motor seizures, however she would have occasional episodes around once a month where she could not get her words out, lasting 3-4 minutes. Sometimes she would get hot and have to sit but her speech would not be affected. On 11/27/16, she started having the same speech difficulties where she was making paraphasic errors, but it continued for 30 minutes and they decided to go to the ER. In the ER, she was noted to be aphasic, then as she was having a head CT, she suddenly had right gaze and head deviation, right arm and leg flexion and shaking for around 3 minutes. She was given IV Ativan but her right leg continued to shake in a rhythmic fashion. She was given additional Keppra and started on Vimpat with no further seizures, back to baseline in a couple of hours. She initially had some difficulties with the Vimpat 100mg  BID where she had hallucinations of hearing music or voices, but these have resolved over the past week. She denies any side effects on Keppra 1500mg  BID. She denies any dizziness, diplopia, gait instability. She has a right foot drop, denies any numbness/tingling on the right side but states it feels different that the other side. She denies any olfactory/gustatory hallucinations, myoclonic jerks, episodes of staring/unresponsiveness, gaps in time. She has not had any further episodes of paraphasic errors since her hospitalization. She had a mild diffuse headache for a couple of days but this has resolved. She denies any dizziness, dysarthria/dysphagia, neck/back pain, bowel/bladder dysfunction.  Records from her hospitalization in August 2016 were reviewed. She has been seeing neurologist  Dr. Leonie Man for post-stroke and seizure care since then, last visit was in August 2017. She had a severe headache for several days in August 2016 2 weeks post-partum. She then developed right-sided weakness, aphasia, then a focal seizure in the hospital. She was found to have a large left parietal ICH with IVH,  cerebral edema, obstructive hydrocephalus and subarachnoid hemorrhage. MRA and MRV were unremarkable. Concern was for cerebral cortical vein thrombosis not seen on MRV, however RCVS was also a consideration. She was treated with hypertonic saline and stabilized. She had a seizure with EEG showing left PLEDs. She was started on Keppra. Hypercoagulable labs were normal. She was discharged to rehab and had another seizure on 06/01/16 with report of eyes rolling back and upper extremities shaking. Keppra dose was increased. She had a repeat MRI brain in September 2016 with resolving subacute left parietal hematoma. There was acute gyral swelling and T2 hyperintensity in the left hippocampal formation, left insula, and operculum, that resolved with repeat MRI 09/2015 showing resolution of these changes. She was initially having some headaches and low dose Topamax was briefly added, then with resolution of headaches, this was tapered off. She had been dealing with post-stroke depression and symptoms had improved, however with limitations on driving again, she is starting to feel depressed again. She has three young children, the youngest is 38 months old. She was not happy with neuro rehab and has a private physical therapist coming weekly where she pushes herself and has made a ton of improvement. She does not like to wear her AFO and her husband did not notice any difference in gait with the AFO.   Epilepsy Risk Factors:  History of large left parietal hematoma with encephalomalacia. Otherwise she had a normal birth and early development.  There is no history of febrile convulsions, CNS infections such as meningitis/encephalitis, significant traumatic brain injury, neurosurgical procedures, or family history of seizures.  Diagnostic Data: I personally reviewed MRI brain 11/26/2016 which did not show any acute changes. There was left paramedian parietal encephalomalacia with extensive hemosiderin staining extending  into the splenium of the corpus callosum, ex vacuo dilatation of the left lateral ventricle occipital horn, increased side of the temporal horn of the left lateral ventricle which may be due to decreased volume of the hippocampus. It is smaller in size with increased signal compared to the right. EEG 11/27/16 showed mild left hemispheric slowing EEG 05/22/15: left hemisphere slowing, rare epileptiform discharges over the left anterior temporal region EEG 05/21/15: PLEDs on the left hemisphere, mainly involving the temporo-parietal region  Prior AEDs: Topamax, Vimpat, oxcarbazepine  PAST MEDICAL HISTORY: Past Medical History:  Diagnosis Date  . Anxiety   . Depression   . ICH (intracerebral hemorrhage) ([place])   . Seizures ([place])   . Stroke [facility])     MEDICATIONS: Current Outpatient Medications on File Prior to Visit  Medication Sig Dispense Refill  . aspirin 81 MG chewable tablet Chew 81 mg by mouth daily.    . Cholecalciferol (VITAMIN D) 2000 units CAPS Take 2,000 Units by mouth daily.    Marland Kitchen FLUoxetine (PROZAC) 20 MG capsule Take 1 capsule (20 mg total) by mouth daily. Take with 40 mg for total of 60 mg daily 90 capsule 1  . FLUoxetine (PROZAC) 40 MG capsule Take 1 capsule (40 mg total) by mouth daily. 90 capsule 0  . levETIRAcetam (KEPPRA) 750 MG tablet Take 2 tablets (1,500 mg total) by mouth 2 (two) times daily. 120 tablet 11  .  LORazepam (ATIVAN) 1 MG tablet Take 1 tablet as needed for seizure 10 tablet 3  . mirtazapine (REMERON) 30 MG tablet Take 1 tablet (30 mg total) by mouth at bedtime. 90 tablet 0  . Multiple Vitamin (MULTIVITAMIN) tablet Take 1 tablet by mouth daily.     . Oxcarbazepine (TRILEPTAL) 300 MG tablet take 2 tablets twice a day 120 tablet 6  . OXcarbazepine ER (OXTELLAR XR) 300 MG TB24 Take 900 mg by mouth at bedtime. 90 tablet 11  . Sulfamethoxazole-Trimethoprim (BACTRIM PO) Take 160 mg by mouth daily.     Current Facility-Administered Medications on File Prior to Visit   Medication Dose Route Frequency Provider Last Rate Last Dose  . gadopentetate dimeglumine (MAGNEVIST) injection 12 mL  12 mL Intravenous Once PRN Garvin Fila, MD        ALLERGIES: No Known Allergies  FAMILY HISTORY: Family History  Problem Relation Age of Onset  . Cancer Mother   . Cancer Father        pancreatic    SOCIAL HISTORY: Social History   Socioeconomic History  . Marital status: Married    Spouse name: Not on file  . Number of children: Not on file  . Years of education: Not on file  . Highest education level: Not on file  Occupational History  . Not on file  Social Needs  . Financial resource strain: Not on file  . Food insecurity:    Worry: Not on file    Inability: Not on file  . Transportation needs:    Medical: Not on file    Non-medical: Not on file  Tobacco Use  . Smoking status: Never Smoker  . Smokeless tobacco: Never Used  Substance and Sexual Activity  . Alcohol use: Yes    Alcohol/week: 1.0 standard drinks    Types: 1 Glasses of wine per week    Comment: casual   . Drug use: No  . Sexual activity: Yes    Birth control/protection: None    Comment: husband has vasectomy  Lifestyle  . Physical activity:    Days per week: Not on file    Minutes per session: Not on file  . Stress: Not on file  Relationships  . Social connections:    Talks on phone: Not on file    Gets together: Not on file    Attends religious service: Not on file    Active member of club or organization: Not on file    Attends meetings of clubs or organizations: Not on file    Relationship status: Not on file  . Intimate partner violence:    Fear of current or ex partner: Not on file    Emotionally abused: Not on file    Physically abused: Not on file    Forced sexual activity: Not on file  Other Topics Concern  . Not on file  Social History Narrative  . Not on file    REVIEW OF SYSTEMS: Constitutional: No fevers, chills, or sweats, no generalized fatigue,  change in appetite Eyes: No visual changes, double vision, eye pain Ear, nose and throat: No hearing loss, ear pain, nasal congestion, sore throat Cardiovascular: No chest pain, palpitations Respiratory:  No shortness of breath at rest or with exertion, wheezes GastrointestinaI: No nausea, vomiting, diarrhea, abdominal pain, fecal incontinence Genitourinary:  No dysuria, urinary retention or frequency Musculoskeletal:  No neck pain, back pain Integumentary: No rash, pruritus, skin lesions Neurological: as above Psychiatric: No depression, insomnia, anxiety Endocrine:  No palpitations, fatigue, diaphoresis, mood swings, change in appetite, change in weight, increased thirst Hematologic/Lymphatic:  No anemia, purpura, petechiae. Allergic/Immunologic: no itchy/runny eyes, nasal congestion, recent allergic reactions, rashes  PHYSICAL EXAM: Vitals:   07/16/18 0833  BP: 98/62  Pulse: 82  SpO2: 98%   General: No acute distress Head:  Normocephalic/atraumatic Neck: supple, no paraspinal tenderness, full range of motion Heart:  Regular rate and rhythm Lungs:  Clear to auscultation bilaterally Back: No paraspinal tenderness Skin/Extremities: No rash, no edema, wearing right AFO Neurological Exam: alert and oriented to person, place, and time. No aphasia or dysarthria. Fund of knowledge is appropriate.  Recent and remote memory are intact.  Attention and concentration are normal.    Able to name objects and repeat phrases. are normal.    Able to name objects and repeat phrases. Cranial nerves: CN I: not tested CN II: pupils equal, round and reactive to light, visual fields intact, fundi unremarkable. CN III, IV, VI:  full range of motion, no nystagmus CN V: decreased on right V1-3 (unchanged) CN VII: upper and lower face symmetric CN VIII: hearing intact to finger rub CN IX, X: gag intact, uvula midline CN XI: sternocleidomastoid and trapezius muscles intact CN XII: tongue midline Bulk &  Tone: increased tone on right LE (more on right ankle), no fasciculations. Motor: 0/5 right foot dorsiflexion, eversion/inversion, 2/5 plantarflexion. Otherwise 5/5 throughout with no pronator drift.  Sensation: decreased on right UE and LE (unchanged) Cerebellar: no incoordination on finger to nose testing Gait: spastic hemiparetic gait with foot circumduction (unchanged) Tremor: none  IMPRESSION: This is a pleasant 42 yo RH woman with a history of large left ICH and focal seizures in August 2016 with residual right foot drop. She has around one breakthrough seizure a year, she had another seizure that progressed to a GTC on 07/05/18, requiring intubation. Medications were changed, there was concern about oxcarbazepine, lowered to 300mg  qhs on discharge, but sodium levels were normal during her admission. They are asking about changing to a different medication. We discussed starting Lamotrigine 25mg  BID x 2 weeks, then increase to 50mg  BID x 2 weeks, then increase to 100mg  BID. Side effects, including Kathreen Cosier syndrome, were discussed. Continue Keppra 1500mg  BID. She will stop oxcarbazepine once on higher dose of Lamotrigine. She and her husband both report that all the breakthrough seizures have occurred with increased stress, she now has prn Xanax to take when stressed out. She is aware of [place] driving laws to stop driving after a seizure until 6 months seizure-free. She will follow-up in 3 months and knows to call for any changes.  Thank you for allowing me to participate in her care.  Please do not hesitate to call for any questions or concerns.  The duration of this appointment visit was 30 minutes of face-to-face time with the patient.  Greater than 50% of this time was spent in counseling, explanation of diagnosis, planning of further management, and coordination of care.   Ellouise Newer, M.D.   CC: Dr. Brigitte Pulse

## 2018-07-20 ENCOUNTER — Ambulatory Visit: Payer: Self-pay | Admitting: Neurology

## 2018-07-20 ENCOUNTER — Encounter

## 2018-07-26 DIAGNOSIS — Z79899 Other long term (current) drug therapy: Secondary | ICD-10-CM | POA: Diagnosis not present

## 2018-07-26 DIAGNOSIS — R82998 Other abnormal findings in urine: Secondary | ICD-10-CM | POA: Diagnosis not present

## 2018-07-27 ENCOUNTER — Ambulatory Visit (INDEPENDENT_AMBULATORY_CARE_PROVIDER_SITE_OTHER): Payer: PPO | Admitting: Licensed Clinical Social Worker

## 2018-07-27 ENCOUNTER — Encounter (HOSPITAL_COMMUNITY): Payer: Self-pay | Admitting: Licensed Clinical Social Worker

## 2018-07-27 DIAGNOSIS — F332 Major depressive disorder, recurrent severe without psychotic features: Secondary | ICD-10-CM | POA: Diagnosis not present

## 2018-07-27 NOTE — Progress Notes (Signed)
   THERAPIST PROGRESS NOTE  Session Time: 10:10-11am  Participation Level: Active  Behavioral Response: CasualAlert/Depressed & agitated  Type of Therapy: Individual Therapy  Treatment Goals addressed: Improve Psychiatric Symptoms, elevate mood (increased self-esteem, increased self-compassion, increased interaction), improve unhelpful thought patterns, controlled behavior, moderate mood, deliberate speech and thought process(improved social functioning, healthy adjustment to living situation), Learn about diagnosis, healthy coping skills.  Interventions: CBT  Summary: Ashley [name] is a 42 y.o. female who presents for her  individual counseling session. Pt discussed her psychiatric symptoms and current life events. Pt presents agitated. She is unable to drive for 6 months due to her last seizure. Her mother is staying with her and her family. Its very tense and she and her mother's arguing has increased. Processed gratitude with pt, how fortunate she is that her mother can assist. Pt is restless and now has to depend on her mother and her husband. She feels dependent. Asked open ended questions and used empathic reflection. Suggested to pt now would be a good time to focus on her self care. Discussed options for self care. Pt has appt with Dr. Daron Offer, psychiatrist in December. Pt saw her neurologist who changed her meds. She feels these are working better with less side effects.       Suicidal/Homicidal: Nowithout intent/plan  Therapist Response: Assessed pt's current functioning by self report and reviewed progress. Assisted pt processing inability to drive, feeling dependent, gratitudes. Assisted pt processing for the management of her stressors.  Plan: Return again in 2 weeks   Diagnosis: Axis I: Depression        Deziree Mokry S, LCAS 07/27/2018

## 2018-08-02 DIAGNOSIS — I69351 Hemiplegia and hemiparesis following cerebral infarction affecting right dominant side: Secondary | ICD-10-CM | POA: Diagnosis not present

## 2018-08-02 DIAGNOSIS — Z0000 Encounter for general adult medical examination without abnormal findings: Secondary | ICD-10-CM | POA: Diagnosis not present

## 2018-08-02 DIAGNOSIS — I69320 Aphasia following cerebral infarction: Secondary | ICD-10-CM | POA: Diagnosis not present

## 2018-08-02 DIAGNOSIS — Z1389 Encounter for screening for other disorder: Secondary | ICD-10-CM | POA: Diagnosis not present

## 2018-08-02 DIAGNOSIS — Z6820 Body mass index (BMI) 20.0-20.9, adult: Secondary | ICD-10-CM | POA: Diagnosis not present

## 2018-08-02 DIAGNOSIS — R569 Unspecified convulsions: Secondary | ICD-10-CM | POA: Diagnosis not present

## 2018-08-02 DIAGNOSIS — F418 Other specified anxiety disorders: Secondary | ICD-10-CM | POA: Diagnosis not present

## 2018-08-02 DIAGNOSIS — E871 Hypo-osmolality and hyponatremia: Secondary | ICD-10-CM | POA: Diagnosis not present

## 2018-08-02 DIAGNOSIS — Z803 Family history of malignant neoplasm of breast: Secondary | ICD-10-CM | POA: Diagnosis not present

## 2018-08-02 DIAGNOSIS — K5909 Other constipation: Secondary | ICD-10-CM | POA: Diagnosis not present

## 2018-08-02 DIAGNOSIS — F324 Major depressive disorder, single episode, in partial remission: Secondary | ICD-10-CM | POA: Diagnosis not present

## 2018-08-04 ENCOUNTER — Ambulatory Visit (INDEPENDENT_AMBULATORY_CARE_PROVIDER_SITE_OTHER): Payer: PPO | Admitting: Licensed Clinical Social Worker

## 2018-08-04 ENCOUNTER — Encounter (HOSPITAL_COMMUNITY): Payer: Self-pay | Admitting: Licensed Clinical Social Worker

## 2018-08-04 DIAGNOSIS — F331 Major depressive disorder, recurrent, moderate: Secondary | ICD-10-CM
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
## 2018-08-06 ENCOUNTER — Other Ambulatory Visit: Payer: Self-pay | Admitting: *Deleted

## 2018-08-06 NOTE — Patient Outreach (Signed)
[place] [facility]) Care Management  08/06/2018  Mkenzie Dotts 05-01-1976 [id number]   Case closure   Call attempts made on 07/12/18, 07/14/18 and 07/16/18 Unsuccessful outreach letter sent on 07/12/18 without a response   Plan [facility] RN CM will close case after no response from patient within 10 business days. Unable to reach   [place]. Lavina Hamman, RN, BSN, [place] Coordinator Office number [phone] Mobile number [phone]  Main [facility] number [phone] Fax number [phone]

## 2018-08-10 ENCOUNTER — Encounter (HOSPITAL_COMMUNITY): Payer: Self-pay | Admitting: Licensed Clinical Social Worker

## 2018-08-10 ENCOUNTER — Ambulatory Visit (INDEPENDENT_AMBULATORY_CARE_PROVIDER_SITE_OTHER): Payer: PPO | Admitting: Licensed Clinical Social Worker

## 2018-08-10 DIAGNOSIS — F419 Anxiety disorder, unspecified: Secondary | ICD-10-CM | POA: Diagnosis not present

## 2018-08-10 NOTE — Progress Notes (Signed)
   THERAPIST PROGRESS NOTE  Session Time: 10:10-11am  Participation Level: Active  Behavioral Response: CasualAlert/Anxious  Type of Therapy: Individual Therapy  Treatment Goals addressed: Improve Psychiatric Symptoms, elevate mood (increased self-esteem, increased self-compassion, increased interaction), improve unhelpful thought patterns, controlled behavior, moderate mood, deliberate speech and thought process(improved social functioning, healthy adjustment to living situation), Learn about diagnosis, healthy coping skills.  Interventions: CBT  Summary: Ashley [name] is a 42 y.o. female who presents for her  individual counseling session. Pt discussed her psychiatric symptoms and current life events. Pt presents anxious today but less agitated from last session. She is beginning to accept that she cannot drive for 6 months due to her seizure. Her husband has hired one of his employees to be her driver for the next 6 months. Initially she was angry and agitated and felt confined to her home. Currently she likes her driver and she is adjusting. Discussed with pt how to become more humble. This part of her life may bring her to use self reflection, to see what she wants her life to be for herself and her children, to slow down, make a plan, not be on the go so much, to stop over planning her day and to let go of some control. Pt is meeting a friend for coffee today. Congratulated pt on finding time for self care and encouraged her to continue to plan for self care.          Suicidal/Homicidal: Nowithout intent/plan  Therapist Response: Assessed pt's current functioning by self report and reviewed progress. Assisted pt processing inability to drive, humility, self care Assisted pt processing for the management of her stressors.  Plan: Return again in 2 weeks   Diagnosis: Axis I: Depression        Nayellie Sanseverino S, LCAS 08/10/2018

## 2018-08-18 ENCOUNTER — Ambulatory Visit (INDEPENDENT_AMBULATORY_CARE_PROVIDER_SITE_OTHER): Payer: PPO | Admitting: Licensed Clinical Social Worker

## 2018-08-18 DIAGNOSIS — F419 Anxiety disorder, unspecified: Secondary | ICD-10-CM | POA: Diagnosis not present

## 2018-08-19 ENCOUNTER — Encounter (HOSPITAL_COMMUNITY): Payer: Self-pay | Admitting: Licensed Clinical Social Worker

## 2018-08-19 NOTE — Progress Notes (Signed)
   THERAPIST PROGRESS NOTE  Session Time: 10:10-11am  Participation Level: Active  Behavioral Response: CasualAlert/Anxious  Type of Therapy: Individual Therapy  Treatment Goals addressed: Improve Psychiatric Symptoms, elevate mood (increased self-esteem, increased self-compassion, increased interaction), improve unhelpful thought patterns, controlled behavior, moderate mood, deliberate speech and thought process(improved social functioning, healthy adjustment to living situation), Learn about diagnosis, healthy coping skills.  Interventions: CBT  Summary: Ashley [name] is a 42 y.o. female who presents for her  individual counseling session. Pt discussed her psychiatric symptoms and current life events. Pt presents anxious today but less agitated from last session. She is has accepted that she cannot drive for 6 months due to her seizure. She still has a driver. Pt reports she is bored because she cannot jump in her car and go whenever or wherever she wants to. Discussed giving up control. Pt admits she has control issues. Asked open ended questions. Pt discussed her relationship with her husband. It has not gotten better but she feel that they live parallel lives. She still does not trust him. Pt also has guilt for breaking up her first marriage. Discussed trust and guilt. Pt reports she has ativan and Klonopin for anxiety and to help prevent another seizure. They are prescribed an as needed. Educated pt on benzodiazepines. Gave pt homework: Complete 1 item on her list made between she and her husband to make their marriage more normal. Continue to suggest to pt the importance of self care.          Suicidal/Homicidal: Nowithout intent/plan  Therapist Response: Assessed pt's current functioning by self report and reviewed progress. Assisted pt processing inability to drive, trust, controlling, parallel lives in marriage, self care Assisted pt processing for the management of her  stressors.  Plan: Return again in 2 weeks   Diagnosis: Axis I: [place], LCAS 08/18/18

## 2018-08-24 ENCOUNTER — Ambulatory Visit (INDEPENDENT_AMBULATORY_CARE_PROVIDER_SITE_OTHER): Payer: PPO | Admitting: Licensed Clinical Social Worker

## 2018-08-24 DIAGNOSIS — F331 Major depressive disorder, recurrent, moderate: Secondary | ICD-10-CM

## 2018-08-25 ENCOUNTER — Ambulatory Visit (HOSPITAL_COMMUNITY): Payer: Self-pay | Admitting: Licensed Clinical Social Worker

## 2018-08-25 ENCOUNTER — Encounter (HOSPITAL_COMMUNITY): Payer: Self-pay | Admitting: Licensed Clinical Social Worker

## 2018-08-25 NOTE — Progress Notes (Signed)
   THERAPIST PROGRESS NOTE  Session Time: 9:10-10am  Participation Level: Active  Behavioral Response: CasualAlert/Anxious  Type of Therapy: Individual Therapy  Treatment Goals addressed: Improve Psychiatric Symptoms, elevate mood (increased self-esteem, increased self-compassion, increased interaction), improve unhelpful thought patterns, controlled behavior, moderate mood, deliberate speech and thought process(improved social functioning, healthy adjustment to living situation), Learn about diagnosis, healthy coping skills.  Interventions: CBT  Summary: [name] is a 42 y.o. female who presents for her  individual counseling session. Pt discussed her psychiatric symptoms and current life events. Pt presents anxious today. Her son (3) did not want to school today. She was concerned about this change. Asked open ended questions and assisted pt with plan to talk to pres-school teacher. Pt did not complete her homework assignments: work on 1 thing on her "relationship" list. She was busy all weekend with her kids and sports. Encouraged pt to complete homework assignment from last session. Pt wanted to continue conversation and processing her first marriage. Discussed with pt her feelings and thoughts. Also discussed "letting go" of guilt. Living in the present and mindfulness skills. Pt was meeting a friend for coffee, continue to encourage pt on the importance of self care.             Suicidal/Homicidal: Nowithout intent/plan  Therapist Response: Assessed pt's current functioning by self report and reviewed progress. Assisted pt processing children, marriage, self care, guilt and letting go. Assisted pt processing for the management of her stressors.  Plan: Return again in 2 weeks   Diagnosis: Axis I: [place], LCAS 08/24/18

## 2018-08-26 NOTE — Addendum Note (Signed)
Addended by: Jenkins Rouge on: 08/26/2018 11:15 AM   Modules accepted: Level of Service

## 2018-09-07 ENCOUNTER — Encounter (HOSPITAL_COMMUNITY): Payer: Self-pay | Admitting: Licensed Clinical Social Worker

## 2018-09-07 ENCOUNTER — Ambulatory Visit (INDEPENDENT_AMBULATORY_CARE_PROVIDER_SITE_OTHER): Payer: PPO | Admitting: Licensed Clinical Social Worker

## 2018-09-07 DIAGNOSIS — F331 Major depressive disorder, recurrent, moderate: Secondary | ICD-10-CM

## 2018-09-07 NOTE — Progress Notes (Signed)
   THERAPIST PROGRESS NOTE  Session Time: 1:10-2pm  Participation Level: Active  Behavioral Response: CasualAlert/Anxious/Depressed  Type of Therapy: Individual Therapy  Treatment Goals addressed: Improve Psychiatric Symptoms, elevate mood (increased self-esteem, increased self-compassion, increased interaction), improve unhelpful thought patterns, controlled behavior, moderate mood, deliberate speech and thought process(improved social functioning, healthy adjustment to living situation), Learn about diagnosis, healthy coping skills.  Interventions: CBT  Summary: Ashley [name] is a 42 y.o. female who presents for her  individual counseling session. Pt discussed her psychiatric symptoms and current life events. Pt sees Dr. Daron Offer at his private practice in December. Pt is overwrought upon presentation. She and her xhusband continue having issues co-parenting. Both spouses have now joined the [place] and it has exacerbated all the issues. Pt continues to become overwrought, shaking, angry and frustrated when dealing with her xhusband. She has found that breathing exercises assist her in these situations. Practiced breathing exercises with pt in session. Assisted pt  With a email to her xhusband to determine a healthier way to co-parent. Continue to encourage pt to find ways for self care.              Suicidal/Homicidal: Nowithout intent/plan  Therapist Response: Assessed pt's current functioning by self report and reviewed progress. Assisted pt processing co-parenting, marriage, self care, breathing exercises. Assisted pt processing for the management of her stressors.  Plan: Pt is getting new insurance, will call back to make new appointment.  Diagnosis: Axis I: [place]        [name],[name], LCAS 09/07/18

## 2018-09-13 DIAGNOSIS — F3342 Major depressive disorder, recurrent, in full remission: Secondary | ICD-10-CM | POA: Diagnosis not present

## 2018-09-22 ENCOUNTER — Ambulatory Visit (HOSPITAL_COMMUNITY): Payer: PPO | Admitting: Licensed Clinical Social Worker

## 2018-10-05 ENCOUNTER — Ambulatory Visit (HOSPITAL_COMMUNITY): Payer: PPO | Admitting: Licensed Clinical Social Worker

## 2018-10-14 ENCOUNTER — Other Ambulatory Visit: Payer: Self-pay

## 2018-10-14 ENCOUNTER — Ambulatory Visit (INDEPENDENT_AMBULATORY_CARE_PROVIDER_SITE_OTHER): Payer: Medicare Other | Admitting: Neurology

## 2018-10-14 ENCOUNTER — Encounter: Payer: Self-pay | Admitting: Neurology

## 2018-10-14 VITALS — BP 88/56 | HR 81 | Ht 63.0 in | Wt 119.0 lb

## 2018-10-14 DIAGNOSIS — G40109 Localization-related (focal) (partial) symptomatic epilepsy and epileptic syndromes with simple partial seizures, not intractable, without status epilepticus: Secondary | ICD-10-CM | POA: Diagnosis not present

## 2018-10-14 MED ORDER — LAMOTRIGINE 100 MG PO TABS: ORAL_TABLET | ORAL | 3 refills | Status: DC

## 2018-10-14 MED ORDER — LEVETIRACETAM 750 MG PO TABS: 1500.0000 mg | ORAL_TABLET | Freq: Two times a day (BID) | ORAL | 3 refills | Status: DC
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
## 2018-10-14 NOTE — Progress Notes (Signed)
NEUROLOGY FOLLOW UP OFFICE NOTE  Ashley [name] [id number] [birth year]  HISTORY OF PRESENT ILLNESS: I had the pleasure of seeing Ashley [name] in follow-up in the neurology clinic on 10/14/2018. The patient was last seen 3 months ago for seizures. She is alone in the office today. On her last visit, Lamotrigine was started after she had a breakthrough seizure on 07/05/18 with right-sided clonic activity. She had issues with hyponatremia in the past on oxcarbazepine, so we switched to Lamotrigine in October 2019. She is also taking Levetiracetam 1500mg  BID. She is doing well on Lamotrigine 100mg  BID and Levetiracetam 1500mg  BID without side effects or seizures since September 2019. She reports having a physical recently with normal sodium levels. She denies any gaps in time, olfactory/gustatory hallucinations, clonic activity. She fell off a stool last week with some back soreness/bruising. Mood is okay. She denies any headaches, dizziness, diplopia.   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 She had focal seizures in 2016 with EEG showing left PLEDs and has been on Keppra 1500mg  BID since then with no further focal motor seizures, however she would have occasional episodes around once a month where she could not get her words out, lasting 3-4 minutes. Sometimes she would get hot and have to sit but her speech would not be affected. On 11/27/16, she started having the same speech difficulties where she was making paraphasic errors, but it continued for 30 minutes and they decided to go to the ER. In the ER, she was noted to be aphasic, then as she was having a head CT, she suddenly had right gaze and head deviation, right arm and leg flexion and shaking for around 3 minutes. She was given IV Ativan but her right leg continued to shake in a rhythmic fashion. She was given additional  Keppra and started on Vimpat with no further seizures, back to baseline in a couple of hours. She initially had some difficulties with the Vimpat 100mg  BID where she had hallucinations of hearing music or voices, but these have resolved over the past week. She denies any side effects on Keppra 1500mg  BID. She denies any dizziness, diplopia, gait instability. She has a right foot drop, denies any numbness/tingling on the right side but states it feels different that the other side. She denies any olfactory/gustatory hallucinations, myoclonic jerks, episodes of staring/unresponsiveness, gaps in time. She has not had any further episodes of paraphasic errors since her hospitalization. She had a mild diffuse headache for a couple of days but this has resolved. She denies any dizziness, dysarthria/dysphagia, neck/back pain, bowel/bladder dysfunction.  Records from her hospitalization in August 2016 were reviewed. She has been seeing neurologist Dr. Leonie Man for post-stroke and seizure care since then, last visit was in August 2017. She had a severe headache for several days in August 2016 2 weeks post-partum. She then developed right-sided weakness, aphasia, then a focal seizure in the hospital. She was found to have a large left parietal ICH with IVH, cerebral edema, obstructive hydrocephalus and subarachnoid hemorrhage. MRA and MRV were unremarkable. Concern was for cerebral cortical vein thrombosis not seen on MRV, however RCVS was also a consideration. She was treated with hypertonic saline and stabilized. She had a seizure with EEG showing left PLEDs. She was started on Keppra. Hypercoagulable labs were normal. She was discharged to rehab and had another seizure  on 06/01/16 with report of eyes rolling back and upper extremities shaking. Keppra dose was increased. She had a repeat MRI brain in September 2016 with resolving subacute left parietal hematoma. There was acute gyral swelling and T2 hyperintensity in the  left hippocampal formation, left insula, and operculum, that resolved with repeat MRI 09/2015 showing resolution of these changes. She was initially having some headaches and low dose Topamax was briefly added, then with resolution of headaches, this was tapered off. She had been dealing with post-stroke depression and symptoms had improved, however with limitations on driving again, she is starting to feel depressed again. She has three young children, the youngest is 34 months old. She was not happy with neuro rehab and has a private physical therapist coming weekly where she pushes herself and has made a ton of improvement. She does not like to wear her AFO and her husband did not notice any difference in gait with the AFO.   Epilepsy Risk Factors:  History of large left parietal hematoma with encephalomalacia. Otherwise she had a normal birth and early development.  There is no history of febrile convulsions, CNS infections such as meningitis/encephalitis, significant traumatic brain injury, neurosurgical procedures, or family history of seizures.  Diagnostic Data: I personally reviewed MRI brain 11/26/2016 which did not show any acute changes. There was left paramedian parietal encephalomalacia with extensive hemosiderin staining extending into the splenium of the corpus callosum, ex vacuo dilatation of the left lateral ventricle occipital horn, increased side of the temporal horn of the left lateral ventricle which may be due to decreased volume of the hippocampus. It is smaller in size with increased signal compared to the right. EEG 11/27/16 showed mild left hemispheric slowing EEG 05/22/15: left hemisphere slowing, rare epileptiform discharges over the left anterior temporal region EEG 05/21/15: PLEDs on the left hemisphere, mainly involving the temporo-parietal region  Prior AEDs: Topamax, Vimpat, oxcarbazepine  PAST MEDICAL HISTORY: Past Medical History:  Diagnosis Date  . Anxiety   .  Depression   . ICH (intracerebral hemorrhage) ([place])   . Seizures ([place])   . Stroke [facility])     MEDICATIONS: Current Outpatient Medications on File Prior to Visit  Medication Sig Dispense Refill  . aspirin 81 MG chewable tablet Chew 81 mg by mouth daily.    . Cholecalciferol (VITAMIN D) 2000 units CAPS Take 2,000 Units by mouth daily.    Marland Kitchen FLUoxetine (PROZAC) 20 MG capsule Take 1 capsule (20 mg total) by mouth daily. Take with 40 mg for total of 60 mg daily 90 capsule 1  . FLUoxetine (PROZAC) 40 MG capsule Take 1 capsule (40 mg total) by mouth daily. 90 capsule 0  . lamoTRIgine (LAMICTAL) 100 MG tablet After finishing prescription for 25mg  tablets, start 100mg  tablet: 1 tablet twice a day 60 tablet 11  . lamoTRIgine (LAMICTAL) 25 MG tablet Take 1 tablet twice a day for 2 weeks, then increase to 2 tablets twice a day for 2 weeks, then start next prescription for further increase 84 tablet 0  . levETIRAcetam (KEPPRA) 750 MG tablet Take 2 tablets (1,500 mg total) by mouth 2 (two) times daily. 120 tablet 11  . LORazepam (ATIVAN) 1 MG tablet Take 1 tablet as needed for seizure 10 tablet 3  . magnesium oxide (MAG-OX) 400 MG tablet Take 400 mg by mouth 2 (two) times daily.    . mirtazapine (REMERON) 30 MG tablet Take 1 tablet (30 mg total) by mouth at bedtime. 90 tablet 0  .  Multiple Vitamin (MULTIVITAMIN) tablet Take 1 tablet by mouth daily.     . Oxcarbazepine (TRILEPTAL) 300 MG tablet Take 1 tablet every night 120 tablet 6  . Sulfamethoxazole-Trimethoprim (BACTRIM PO) Take 160 mg by mouth daily.     No current facility-administered medications on file prior to visit.     ALLERGIES: No Known Allergies  FAMILY HISTORY: Family History  Problem Relation Age of Onset  . Cancer Mother   . Cancer Father        pancreatic    SOCIAL HISTORY: Social History   Socioeconomic History  . Marital status: Married    Spouse name: Not on file  . Number of children: Not on file  . Years of  education: Not on file  . Highest education level: Not on file  Occupational History  . Not on file  Social Needs  . Financial resource strain: Not on file  . Food insecurity:    Worry: Not on file    Inability: Not on file  . Transportation needs:    Medical: Not on file    Non-medical: Not on file  Tobacco Use  . Smoking status: Never Smoker  . Smokeless tobacco: Never Used  Substance and Sexual Activity  . Alcohol use: Yes    Alcohol/week: 1.0 standard drinks    Types: 1 Glasses of wine per week    Comment: casual   . Drug use: No  . Sexual activity: Yes    Birth control/protection: None    Comment: husband has vasectomy  Lifestyle  . Physical activity:    Days per week: Not on file    Minutes per session: Not on file  . Stress: Not on file  Relationships  . Social connections:    Talks on phone: Not on file    Gets together: Not on file    Attends religious service: Not on file    Active member of club or organization: Not on file    Attends meetings of clubs or organizations: Not on file    Relationship status: Not on file  . Intimate partner violence:    Fear of current or ex partner: Not on file    Emotionally abused: Not on file    Physically abused: Not on file    Forced sexual activity: Not on file  Other Topics Concern  . Not on file  Social History Narrative  . Not on file    REVIEW OF SYSTEMS: Constitutional: No fevers, chills, or sweats, no generalized fatigue, change in appetite Eyes: No visual changes, double vision, eye pain Ear, nose and throat: No hearing loss, ear pain, nasal congestion, sore throat Cardiovascular: No chest pain, palpitations Respiratory:  No shortness of breath at rest or with exertion, wheezes GastrointestinaI: No nausea, vomiting, diarrhea, abdominal pain, fecal incontinence Genitourinary:  No dysuria, urinary retention or frequency Musculoskeletal:  No neck pain, +back pain Integumentary: No rash, pruritus, skin  lesions Neurological: as above Psychiatric: No depression, insomnia, anxiety Endocrine: No palpitations, fatigue, diaphoresis, mood swings, change in appetite, change in weight, increased thirst Hematologic/Lymphatic:  No anemia, purpura, petechiae. Allergic/Immunologic: no itchy/runny eyes, nasal congestion, recent allergic reactions, rashes  PHYSICAL EXAM: Vitals:   10/14/18 0935  BP: (!) 88/56  Pulse: 81  SpO2: 98%   General: No acute distress Head:  Normocephalic/atraumatic Neck: supple, no paraspinal tenderness, full range of motion Heart:  Regular rate and rhythm Lungs:  Clear to auscultation bilaterally Back: No paraspinal tenderness Skin/Extremities: No rash, no edema,  wearing right AFO Neurological Exam: alert and oriented to person, place, and time. No aphasia or dysarthria. Fund of knowledge is appropriate.  Recent and remote memory are intact.  Attention and concentration are normal.    Able to name objects and repeat phrases. are normal.    Able to name objects and repeat phrases. Cranial nerves: CN I: not tested CN II: pupils equal, round and reactive to light, visual fields intact CN III, IV, VI:  full range of motion, no nystagmus CN VII: upper and lower face symmetric CN VIII: hearing intact to conversation Bulk & Tone: increased tone on right LE (more on right ankle), no fasciculations. Motor: 0/5 right foot dorsiflexion, eversion/inversion, 2/5 plantarflexion. Otherwise 5/5 throughout with no pronator drift.  Sensation: decreased on right UE and LE (unchanged) Cerebellar: no incoordination on finger to nose testing Gait: spastic hemiparetic gait with foot circumduction (unchanged) Tremor: none  IMPRESSION: This is a pleasant 43 yo RH woman with a history of large left ICH and focal seizures in August 2016 with residual right foot drop. She has around one breakthrough seizure a year, she had another seizure that progressed to a GTC on 07/05/18, requiring  intubation. She has had hyponatremia from oxcarbazepine and has now switched to lamotrigine 100mg  BID with Levetiracetam 1500mg  BID with no further seizures since September 2019, no side effects. Recent sodium level normal per patient. Refills for medications sent. She is aware of [place] driving laws to stop driving after a seizure until 6 months seizure-free. She will follow-up in 6 months and knows to call for any changes.  Thank you for allowing me to participate in her care.  Please do not hesitate to call for any questions or concerns.  The duration of this appointment visit was 20 minutes of face-to-face time with the patient.  Greater than 50% of this time was spent in counseling, explanation of diagnosis, planning of further management, and coordination of care.   Ellouise Newer, M.D.   CC: Dr. Brigitte Pulse

## 2018-10-26 DIAGNOSIS — Z1379 Encounter for other screening for genetic and chromosomal anomalies: Secondary | ICD-10-CM | POA: Diagnosis not present

## 2018-10-26 DIAGNOSIS — Z8489 Family history of other specified conditions: Secondary | ICD-10-CM | POA: Diagnosis not present

## 2018-11-09 ENCOUNTER — Other Ambulatory Visit: Payer: Self-pay | Admitting: Neurology

## 2018-11-09 ENCOUNTER — Telehealth: Payer: Self-pay | Admitting: Neurology

## 2018-11-09 MED ORDER — LAMOTRIGINE 100 MG PO TABS: ORAL_TABLET | ORAL | 3 refills | Status: DC

## 2018-11-09 NOTE — Telephone Encounter (Signed)
Patient wants to talk to someone about increasing the Lamotrigine  100mg  twice a day. She states that she sent a message by my chart but has not heard anything back yet please call

## 2018-11-10 NOTE — Telephone Encounter (Signed)
This was addressed with by Dr. Delice Lesch via MyChart message

## 2018-11-26 ENCOUNTER — Other Ambulatory Visit: Payer: Self-pay

## 2018-11-26 MED ORDER — LORAZEPAM 1 MG PO TABS: ORAL_TABLET | ORAL | 4 refills | Status: DC

## 2018-11-26 NOTE — Telephone Encounter (Signed)
Received call from pt stating that she has been experiencing on/off numbness and tingling on her right side for the past 3 days.  Verbal per Dr. Delice Lesch to increase Lamotrigine to 2 tabs BID as she suspects that these symptoms may be slight seizures.  Pt agreeable to increase.  I asked that pt return call to office after the weekend with update.  Pt states that she is going to [place] on vacation from 11/29/2018-12/05/2018 but states that she will call office with update upon her return. Pt states that she has enough lamotrigine on hand to complete increase during her vacation.    Pt asks for refill of ativan as she did take Ativan during her first few episodes of numbness/tingling - states symptoms went away after taking Ativan.  Verbal per Dr. Delice Lesch - OK to send Rx.  Verified pt's preferred pharmacy.   Ativan 1mg  #10 with 4 refills Sig = Take 1 Tab PRN for seizure  Forwarded to Dr. Delice Lesch for approval.

## 2019-01-03 DIAGNOSIS — F3342 Major depressive disorder, recurrent, in full remission: Secondary | ICD-10-CM | POA: Diagnosis not present

## 2019-01-03 DIAGNOSIS — S82034A Nondisplaced transverse fracture of right patella, initial encounter for closed fracture: Secondary | ICD-10-CM | POA: Diagnosis not present

## 2019-01-03 DIAGNOSIS — M25561 Pain in right knee: Secondary | ICD-10-CM | POA: Diagnosis not present

## 2019-01-18 DIAGNOSIS — M25511 Pain in right shoulder: Secondary | ICD-10-CM | POA: Diagnosis not present

## 2019-01-18 DIAGNOSIS — M25571 Pain in right ankle and joints of right foot: Secondary | ICD-10-CM | POA: Diagnosis not present

## 2019-01-18 DIAGNOSIS — S4991XA Unspecified injury of right shoulder and upper arm, initial encounter: Secondary | ICD-10-CM | POA: Diagnosis not present

## 2019-01-18 DIAGNOSIS — S82034A Nondisplaced transverse fracture of right patella, initial encounter for closed fracture: Secondary | ICD-10-CM | POA: Diagnosis not present

## 2019-01-26 DIAGNOSIS — S4991XA Unspecified injury of right shoulder and upper arm, initial encounter: Secondary | ICD-10-CM | POA: Diagnosis not present

## 2019-02-01 DIAGNOSIS — S4990XD Unspecified injury of shoulder and upper arm, unspecified arm, subsequent encounter: Secondary | ICD-10-CM | POA: Diagnosis not present

## 2019-02-01 DIAGNOSIS — S82034A Nondisplaced transverse fracture of right patella, initial encounter for closed fracture: Secondary | ICD-10-CM | POA: Diagnosis not present

## 2019-02-01 DIAGNOSIS — S82034D Nondisplaced transverse fracture of right patella, subsequent encounter for closed fracture with routine healing: Secondary | ICD-10-CM | POA: Diagnosis not present

## 2019-02-07 DIAGNOSIS — Z20818 Contact with and (suspected) exposure to other bacterial communicable diseases: Secondary | ICD-10-CM | POA: Diagnosis not present

## 2019-02-07 DIAGNOSIS — R509 Fever, unspecified: Secondary | ICD-10-CM | POA: Diagnosis not present

## 2019-02-07 DIAGNOSIS — R05 Cough: Secondary | ICD-10-CM | POA: Diagnosis not present

## 2019-02-08 ENCOUNTER — Telehealth: Payer: Self-pay | Admitting: Neurology

## 2019-02-08 ENCOUNTER — Other Ambulatory Visit: Payer: Self-pay

## 2019-02-08 MED ORDER — LAMOTRIGINE 100 MG PO TABS: ORAL_TABLET | ORAL | 3 refills | Status: DC

## 2019-02-08 NOTE — Telephone Encounter (Signed)
Patient is needing new prescription for the lamotrigine medication because of the increase dosage. Please send to the [facility] on file. Thanks!

## 2019-02-22 ENCOUNTER — Ambulatory Visit: Payer: Self-pay | Admitting: Physical Medicine & Rehabilitation

## 2019-02-22 ENCOUNTER — Ambulatory Visit: Payer: Self-pay

## 2019-03-09 ENCOUNTER — Encounter: Payer: Self-pay | Admitting: Occupational Therapy

## 2019-03-09 ENCOUNTER — Other Ambulatory Visit: Payer: Self-pay

## 2019-03-09 ENCOUNTER — Ambulatory Visit: Payer: Medicare Other | Attending: Physical Medicine & Rehabilitation | Admitting: Occupational Therapy

## 2019-03-09 DIAGNOSIS — M25611 Stiffness of right shoulder, not elsewhere classified: Secondary | ICD-10-CM | POA: Diagnosis not present

## 2019-03-09 DIAGNOSIS — R293 Abnormal posture: Secondary | ICD-10-CM | POA: Diagnosis not present

## 2019-03-09 DIAGNOSIS — R2681 Unsteadiness on feet: Secondary | ICD-10-CM | POA: Diagnosis not present

## 2019-03-09 DIAGNOSIS — I69851 Hemiplegia and hemiparesis following other cerebrovascular disease affecting right dominant side: Secondary | ICD-10-CM | POA: Diagnosis not present

## 2019-03-09 NOTE — Therapy (Signed)
[facility] [address] [place], [place], [address] Phone: [phone]   Fax:  [phone]  Occupational Therapy Evaluation  Patient Details  Name: Ashley [name] MRN: [id number] Date of Birth: 1976-04-21 Referring Provider (OT): Dr Marton Redwood   Encounter Date: 03/09/2019  OT End of Session - 03/09/19 1734    Visit Number  1    Number of Visits  8    Date for OT Re-Evaluation  05/04/19    Authorization Type  medicare with [employer] - will need PN every 10th visit    Authorization - Visit Number  1    Authorization - Number of Visits  10    OT Start Time  2778    OT Stop Time  0929    OT Time Calculation (min)  42 min    Activity Tolerance  Patient tolerated treatment well       Past Medical History:  Diagnosis Date  . Anxiety   . Depression   . ICH (intracerebral hemorrhage) ([place])   . Seizures ([place])   . Stroke [facility])     Past Surgical History:  Procedure Laterality Date  . DILATION AND CURETTAGE OF UTERUS      There were no vitals filed for this visit.  Subjective Assessment - 03/09/19 0850    Subjective   My foot rolls out to the right.      Pertinent History  see epic snapshot; pt with SAH 2 weeks after deliverying her 3rd child    Patient Stated Goals  strength of arm and get my R leg working better    Currently in Pain?  No/denies        [facility] OT Assessment - 03/09/19 0001      Assessment   Medical Diagnosis  late effects of L CVA    Referring Provider (OT)  Dr Marton Redwood    Onset Date/Surgical Date  03/02/19    Hand Dominance  Right    Prior Therapy  pt received PT, OT and ST inpt rehab and outpatient       Precautions   Precautions  Fall      Balance Screen   Has the patient fallen in the past 6 months  Yes    How many times?  --   1 x/month on average   Has the patient had a decrease in activity level because of a fear of falling?   Yes      Prior Function   Level of Independence   Independent    Vocation  On disability    Leisure  boating, reading, working out as I can, playing with the kids      ADL   Eating/Feeding  Independent    Grooming  Independent    Upper Body Bathing  Independent    Lower Body Bathing  Independent    Upper Body Dressing  Independent    Lower Body Dressing  Independent    Toilet Transfer  Independent    Toileting - [place] Transfer  [place] care of all shopping needs independently   has difficulty picking up heavy items   Meal Prep  Plans, prepares and serves adequate meals independently    [place] own vehicle    Medication Management  Is responsible for taking medication in correct dosages at  correct time    Psychiatrist financial matters independently (budgets, writes checks, pays rent, bills goes to bank), collects and keeps track of income      Mobility   Mobility Status  History of falls    Mobility Status Comments  Pt reports she is falling at least 1x/month      Written Expression   Dominant Hand  Right      Vision - History   Baseline Vision  No visual deficits      Activity Tolerance   Activity Tolerance  Endurance does not limit participation in activity      Cognition   Overall Cognitive Status  Within Functional Limits for tasks assessed    Cognition Comments  Pt reports that short term memory remains an issue - reports she keeps notes, will double check with family members, ask for clarification      Sensation   Light Touch  Impaired by gross assessment   impaired for RLE as well   Hot/Cold  --   hyper sensitive for RUE/RLE LE greater than RUE   Proprioception  Impaired by gross assessment   for small movements in UE/impaired for RLE     Coordination   Gross Motor Movements are Fluid and Coordinated  No    Fine Motor Movements are Fluid and Coordinated  Yes       Tone   Assessment Location  Right Upper Extremity      ROM / Strength   AROM / PROM / Strength  AROM;Strength      AROM   Overall AROM   Deficits    Overall AROM Comments  R shoulder flexion 125*, abduction to 120*       Strength   Overall Strength  Deficits    Overall Strength Comments  3+/5 for RUE shoulder flexion and abduction.Marland Kitchen RLE hip flexion 3+/5, hip extension 3+/5, abduction 3+/5, knee flexion 4/5, knee extension 4/5. Will assess foot in pool       Hand Function   Right Hand Gross Grasp  Functional    Right Hand Grip (lbs)  55    Left Hand Gross Grasp  Functional    Left Hand Grip (lbs)  60      RUE Tone   RUE Tone  Hypertonic;Mild;Modified Ashworth      RUE Tone   Modified Ashworth Scale for Grading Hypertonia RUE  Slight increase in muscle tone, manifested by a catch and release or by minimal resistance at the end of the range of motion when the affected part(s) is moved in flexion or extension                        OT Short Term Goals - 03/09/19 1724      OT SHORT TERM GOAL #1   Title  Pt will demonstrate improved overhead reach to at least 130* of shoulder flexion RUE  to assist with heavier overhead home mgmt tasks - 6/24//2020    Status  New      OT SHORT TERM GOAL #2   Title  Pt will demonstrate improved RUE strength /core strength to assist with lifting heavy items in grocery store with greater ease (per pt report)    Status  New        OT Long Term Goals - 03/09/19 1727      OT LONG TERM GOAL #1   Title  Pt will be mod I with upgraded HEP  to include aquatic based HEP - 05/04/2019    Status  New      OT LONG TERM GOAL #2   Title  Pt will demonstrate improved postural alignment and control to reduce falls (has been falling at least 1x/month)    Status  New            Plan - 03/09/19 1728    Clinical Impression Statement  Pt is a 43 year old female with history of L ICH and SAH with more recent seizure activity who returns  to the clinic at this time due to falls and decreased functional use of RUE.  PMH: h/o stroke, seizures, falls, anxiety and depression.  Pt presents today with the following impairments that continue to impact her mobility and functional use of RUE:  spastic R dominant hemiplegia, decreased balance, decreased postural alignment and control, decreased strength RUE and RLE, impaired sensation. Pt reports she has been falling approximately 1x/month and twice has suffered fractures due to falls. Pt will benefit from skilled OT to address these deficits to reduce fall risk and improve functional use of RUE. Pt in agreement.     OT Occupational Profile and History  Problem Focused Assessment - Including review of records relating to presenting problem    Occupational performance deficits (Please refer to evaluation for details):  IADL's;Leisure;Social Participation    Rehab Potential  Good    Clinical Decision Making  Limited treatment options, no task modification necessary    Comorbidities Affecting Occupational Performance:  May have comorbidities impacting occupational performance    OT Frequency  1x / week    OT Duration  8 weeks    OT Treatment/Interventions  Aquatic Therapy;Self-care/ADL training;Neuromuscular education;Therapeutic exercise;Functional Mobility Training;Balance training    Plan  aquatic therapy focused on NMR for RUE functional use, postural alignment and control, RLE strengtening and balance.     Consulted and Agree with Plan of Care  Patient       Patient will benefit from skilled therapeutic intervention in order to improve the following deficits and impairments:     Visit Diagnosis: Spastic hemiplegia of right dominant side as late effect of other cerebrovascular disease ([place]) - Plan: Ot plan of care cert/re-cert  Unsteadiness on feet - Plan: Ot plan of care cert/re-cert  Abnormal posture - Plan: Ot plan of care cert/re-cert  Stiffness of right shoulder, not elsewhere  classified - Plan: Ot plan of care cert/re-cert    Problem List Patient Active Problem List   Diagnosis Date Noted  . Depression 03/23/2017  . Anxiety 03/23/2017  . Muscle weakness 02/03/2017  . History of hemorrhagic stroke with residual hemiparesis ([place]) 12/10/2016  . Spastic hemiplegia and hemiparesis affecting dominant side ([place]) 09/13/2015  . Adhesive capsulitis of right shoulder 08/24/2015  . Localization-related symptomatic epilepsy and epileptic syndromes with simple partial seizures, not intractable, without status epilepticus ([place]) 08/21/2015  . Nontraumatic cortical hemorrhage of cerebral hemisphere ([place])   . Seizure ([place])   . Seizures ([place])   . Adjustment disorder with depressed mood   . Contracture of muscle ankle and foot 06/11/2015  . Hemiplga fol ntrm intcrbl hemor aff right dominant side ([place]) 06/06/2015  . Left-sided intracerebral hemorrhage ([place]) 06/04/2015  . Seizure disorder as sequela of cerebrovascular accident ([place])   . Seizure disorder ([place]) 06/03/2015  . Sepsis ([place]) 06/03/2015  . UTI (urinary tract infection) 06/03/2015  . Hypotension 06/02/2015  . Right spastic hemiparesis ([place]) 05/28/2015  . Aphasia following nontraumatic intracerebral hemorrhage 05/28/2015  .  History of anxiety disorder 05/28/2015  . ICH (intracerebral hemorrhage) ([place])   . Seizure disorder, nonconvulsive, with status epilepticus ([place])   . Cerebral venous thrombosis of cortical vein   . Cytotoxic cerebral edema ([place])   . IVH (intraventricular hemorrhage) ([place])   . Term pregnancy 05/07/2015  . Spontaneous vaginal delivery 05/07/2015    Quay Burow, OTR/L 03/09/2019, 5:38 PM  [facility] [address] [place], [place], [address] Phone: [phone]   Fax:  [phone]  Name: Luca Burston MRN: [id number] Date of Birth: 07/23/1976

## 2019-03-16 ENCOUNTER — Ambulatory Visit: Payer: Medicare Other | Admitting: Occupational Therapy

## 2019-03-23 ENCOUNTER — Other Ambulatory Visit: Payer: Self-pay

## 2019-03-23 ENCOUNTER — Ambulatory Visit: Payer: Medicare Other | Attending: Physical Medicine & Rehabilitation | Admitting: Occupational Therapy

## 2019-03-23 ENCOUNTER — Encounter: Payer: Self-pay | Admitting: Occupational Therapy

## 2019-03-23 DIAGNOSIS — R293 Abnormal posture: Secondary | ICD-10-CM | POA: Diagnosis not present

## 2019-03-23 DIAGNOSIS — R2681 Unsteadiness on feet: Secondary | ICD-10-CM

## 2019-03-23 DIAGNOSIS — I69851 Hemiplegia and hemiparesis following other cerebrovascular disease affecting right dominant side: Secondary | ICD-10-CM | POA: Diagnosis not present

## 2019-03-23 DIAGNOSIS — M25611 Stiffness of right shoulder, not elsewhere classified: Secondary | ICD-10-CM | POA: Diagnosis not present

## 2019-03-23 NOTE — Therapy (Signed)
[facility] [address] [place], [place], [address] Phone: [phone]   Fax:  [phone]  Occupational Therapy Treatment  Patient Details  Name: Ashley [name] MRN: [id number] Date of Birth: 1976-07-01 Referring Provider (OT): Dr Marton Redwood   Encounter Date: 03/23/2019  OT End of Session - 03/23/19 1531    Visit Number  2    Number of Visits  8    Date for OT Re-Evaluation  05/04/19    Authorization Type  medicare with [employer] - will need PN every 10th visit    Authorization - Visit Number  2    Authorization - Number of Visits  10    OT Start Time  1050    OT Stop Time  1155    OT Time Calculation (min)  65 min    Activity Tolerance  Patient tolerated treatment well       Past Medical History:  Diagnosis Date  . Anxiety   . Depression   . ICH (intracerebral hemorrhage) ([place])   . Seizures ([place])   . Stroke [facility])     Past Surgical History:  Procedure Laterality Date  . DILATION AND CURETTAGE OF UTERUS      There were no vitals filed for this visit.  Subjective Assessment - 03/23/19 1528    Subjective   I am nervous about this water therapy    Pertinent History  see epic snapshot; pt with [facility] 2 weeks after deliverying her 3rd child    Patient Stated Goals  strength of arm and get my R leg working better    Currently in Pain?  Yes    Pain Score  --   varies from 6-10   Pain Location  Foot    Pain Orientation  Right    Pain Descriptors / Indicators  Sharp;Sore    Pain Type  Chronic pain    Pain Onset  More than a month ago    Pain Frequency  Intermittent    Aggravating Factors   R lateral side/under big toe in R foot when pt walks without her brace (when she weight bears)    Pain Relieving Factors  walk with AFO, rest.        Treatment:  Patient seen for first session of aquatic therapy today.  Treatment took place in water 2.5-4 feet deep depending upon activity.  Pt entered the pool via steps  using 2 hand rails, contact guard and min cues.  Given that this was pt's first session for aquatic therapy, educated pt on basic principles of water to allow pt to modify HEP.  Initially focused on improving alignment and ROM of RLE, especially ankle and foot using aqua stretch techniques - pt tolerated well.  Pt has pain in R foot on R lateral aspect as well as under big toe mound and mid foot due to significant inversion of R foot with stepping especially without brace on.  Stretches focused on increasing alignment for sub talar neutral as well as increased dorsiflexion - also focused on manual aqua techniques to improve alignment of forefoot. With use of air cast, pt able to then actively work on plantar/dorsiflexion with toe ups/heel downs on edge of bottom step in pool with min assist and mod cues.  Progressed to active aqua stretch for RLE hip flexion with pt then able to move into isolated hip extension (10 reps x2), with pelvis aligned with min assist and BUE support on edge of pool wall.  Addressed lateral weight shifts on to more active RLE with emphasis on postural alignment with weight shift with mod assist initially - pt able to progress to min assist for activity.  Addressed forward and backward walking with emphasis on full weight shift onto RLE during stance to activate not only RLE but also trunk and R shoulder girdle with attention to active RLE hip extension with backward walking.  Also addressed dynamic standing balance with this activity by reducing amount of LUE support on pool wall with progression of this activity.  Progressed to functional ambulation with only light UE support in open water with focus on postural alignment and control as well as dynamic standing balance.  At end of session, pt stated "I was so nervous but I love this!"  Pt exited pool via steps using 2 hand rails, contact guard and min cues.                       OT Short Term Goals - 03/09/19 1724       OT SHORT TERM GOAL #1   Title  Pt will demonstrate improved overhead reach to at least 130* of shoulder flexion RUE  to assist with heavier overhead home mgmt tasks - 6/24//2020    Status  New      OT SHORT TERM GOAL #2   Title  Pt will demonstrate improved RUE strength /core strength to assist with lifting heavy items in grocery store with greater ease (per pt report)    Status  New        OT Long Term Goals - 03/09/19 1727      OT LONG TERM GOAL #1   Title  Pt will be mod I with upgraded HEP to include aquatic based HEP - 05/04/2019    Status  New      OT LONG TERM GOAL #2   Title  Pt will demonstrate improved postural alignment and control to reduce falls (has been falling at least 1x/month)    Status  New            Plan - 03/23/19 1530    Clinical Impression Statement  Pt attending aquatic therapy for first time today.  Pt progressing toward goals.     OT Occupational Profile and History  Problem Focused Assessment - Including review of records relating to presenting problem    Occupational performance deficits (Please refer to evaluation for details):  IADL's;Leisure;Social Participation    Rehab Potential  Good    Clinical Decision Making  Limited treatment options, no task modification necessary    Comorbidities Affecting Occupational Performance:  May have comorbidities impacting occupational performance    OT Frequency  1x / week    OT Duration  8 weeks    OT Treatment/Interventions  Aquatic Therapy;Self-care/ADL training;Neuromuscular education;Therapeutic exercise;Functional Mobility Training;Balance training    Plan  aquatic therapy focused on NMR for RUE functional use, postural alignment and control, RLE strengtening and balance.     Consulted and Agree with Plan of Care  Patient       Patient will benefit from skilled therapeutic intervention in order to improve the following deficits and impairments:     Visit Diagnosis: Spastic hemiplegia of right  dominant side as late effect of other cerebrovascular disease (HCC)  Unsteadiness on feet  Abnormal posture  Stiffness of right shoulder, not elsewhere classified    Problem List Patient Active Problem List   Diagnosis Date Noted  . Depression  03/23/2017  . Anxiety 03/23/2017  . Muscle weakness 02/03/2017  . History of hemorrhagic stroke with residual hemiparesis ([place]) 12/10/2016  . Spastic hemiplegia and hemiparesis affecting dominant side ([place]) 09/13/2015  . Adhesive capsulitis of right shoulder 08/24/2015  . Localization-related symptomatic epilepsy and epileptic syndromes with simple partial seizures, not intractable, without status epilepticus ([place]) 08/21/2015  . Nontraumatic cortical hemorrhage of cerebral hemisphere ([place])   . Seizure ([place])   . Seizures ([place])   . Adjustment disorder with depressed mood   . Contracture of muscle ankle and foot 06/11/2015  . Hemiplga fol ntrm intcrbl hemor aff right dominant side ([place]) 06/06/2015  . Left-sided intracerebral hemorrhage ([place]) 06/04/2015  . Seizure disorder as sequela of cerebrovascular accident ([place])   . Seizure disorder ([place]) 06/03/2015  . Sepsis ([place]) 06/03/2015  . UTI (urinary tract infection) 06/03/2015  . Hypotension 06/02/2015  . Right spastic hemiparesis ([place]) 05/28/2015  . Aphasia following nontraumatic intracerebral hemorrhage 05/28/2015  . History of anxiety disorder 05/28/2015  . ICH (intracerebral hemorrhage) ([place])   . Seizure disorder, nonconvulsive, with status epilepticus ([place])   . Cerebral venous thrombosis of cortical vein   . Cytotoxic cerebral edema ([place])   . IVH (intraventricular hemorrhage) ([place])   . Term pregnancy 05/07/2015  . Spontaneous vaginal delivery 05/07/2015    Quay Burow, OTR/L 03/23/2019, 3:33 PM  [facility] [address] [place], [place], [address] Phone: [phone]   Fax:  [phone]  Name: Ashley [name] MRN: [id number] Date of Birth: [birth year]

## 2019-03-25 ENCOUNTER — Other Ambulatory Visit: Payer: Self-pay

## 2019-03-25 ENCOUNTER — Encounter: Payer: Medicare Other | Attending: Physical Medicine & Rehabilitation | Admitting: Physical Medicine & Rehabilitation

## 2019-03-25 ENCOUNTER — Encounter: Payer: Self-pay | Admitting: Physical Medicine & Rehabilitation

## 2019-03-25 VITALS — BP 96/61 | HR 78 | Temp 98.9°F | Ht 63.0 in | Wt 125.4 lb

## 2019-03-25 DIAGNOSIS — G8110 Spastic hemiplegia affecting unspecified side: Secondary | ICD-10-CM | POA: Diagnosis not present

## 2019-03-25 NOTE — Patient Instructions (Signed)
IncobotulinumtoxinA injection What is this medicine? INCOBOTULINUMTOXINA (IN koh BOT ue LYE num TOX in AY) is a neuro-muscular blocker. This medicine is used to treat eyelid, neck muscle, and hand and arm muscle spasms. It is also used to decrease drooling and to treat frown lines on the face. This medicine may be used for other purposes; ask your health care provider or pharmacist if you have questions. COMMON BRAND NAME(S): Xeomin What should I tell my health care provider before I take this medicine? They need to know if you have any of these conditions: -bleeding disorders -cerebral palsy -difficulty swallowing -history of surgery where this medicine is going to be used -infection where this medicine is going to be used -lung or breathing disease, like asthma -myasthenia gravis or other neurologic disease -nerve or muscle disease -surgery plans -an unusual or allergic reaction to botulinum toxin, albumin, sucrose, other medicines, foods, dyes, or preservatives -pregnant or trying to get pregnant -breast-feeding How should I use this medicine? This medicine is for injection into a muscle. It is given by a health care professional in a hospital or clinic setting. Talk to your pediatrician regarding the use of this medicine in children. This medicine is not approved for use in children. A special MedGuide will be given to you before each treatment. Be sure to read this information carefully each time. Overdosage: If you think you have taken too much of this medicine contact a poison control center or emergency room at once. NOTE: This medicine is only for you. Do not share this medicine with others. What if I miss a dose? This does not apply. What may interact with this medicine? -aminoglycoside antibiotics like gentamicin, neomycin, tobramycin -antihistamines for allergy, cough and cold -atropine -certain medicines for bladder problems like oxybutynin, tolterodine -certain medicines  for Parkinson's disease like benztropine, trihexyphenidyl -certain medicines for sleep -certain medicines for stomach problems like dicyclomine, hyoscyamine -certain medicines for travel sickness like scopolamine -certain medicines that treat or prevent blood clots like warfarin, enoxaparin, and dalteparin -ipratropium -muscle relaxants -other botulinum toxin injections This list may not describe all possible interactions. Give your health care provider a list of all the medicines, herbs, non-prescription drugs, or dietary supplements you use. Also tell them if you smoke, drink alcohol, or use illegal drugs. Some items may interact with your medicine. What should I watch for while using this medicine? Visit your doctor for regular check ups. This medicine will cause weakness in the muscle where it is injected. Tell your doctor if you feel unusually weak in other muscles. Get medical help right away if you have problems with breathing, swallowing, or talking. This medicine contains albumin from human blood. It may be possible to pass an infection in this medicine, but no cases have been reported. Talk to your doctor about the risks and benefits of this medicine. If your activities have been limited by your condition, go back to your regular routine slowly after treatment with this medicine. You may get muscle weakness, blurred vision, or drooping eyelids. If this happens, do not drive, use machinery, or do other dangerous activities. What side effects may I notice from receiving this medicine? Side effects that you should report to your doctor or health care professional as soon as possible: -allergic reactions like skin rash, itching or hives, swelling of the face, lips, or tongue -breathing problems -changes in vision -eye irritation -infection -numbness -speech problems -swallowing problems -trouble passing urine or change in the amount of urine Side effects   that usually do not require  medical attention (report to your doctor or health care professional if they continue or are bothersome): -bruising or pain at site where injected -drooping eyelid -dry eyes or mouth -headache -muscle aches, pains This list may not describe all possible side effects. Call your doctor for medical advice about side effects. You may report side effects to FDA at 1-800-FDA-1088. Where should I keep my medicine? This drug is given in a hospital or clinic and will not be stored at home. NOTE: This sheet is a summary. It may not cover all possible information. If you have questions about this medicine, talk to your doctor, pharmacist, or health care provider.  2019 Elsevier/Gold Standard (2017-04-20 11:43:52)  

## 2019-03-25 NOTE — Progress Notes (Signed)
Subjective:    Patient ID: Ashley [name], female    DOB: November 17, 1975, 43 y.o.   MRN: [id number]  HPI  43 year old female with history of peripartum stroke.  Patient initially had right upper extremity weakness as well as right lower extremity weakness.  Her upper extremity weakness did improve she continues to have severe spasticity in the right lower extremity.  She complains of walking on the outside of her foot.  She also complains of poor balance and falls. Patient has undergone multiple Botox injections last performed in 2017 targeting the posterior tibialis, FDL as well as gastrosoleus complex.  Doses up to 400 units were utilized.  Patient felt there was of minimal benefit from these.  In addition she was trialed on Dysport a total of 1500 units however she developed some myasthenic symptoms in the right upper extremity after a right lower extremity injection. Last botulinum toxin injection was in 2018. Patient has also been evaluated by orthopedic surgery at [facility] Dr. Francee Gentile, 12/22/2016.SPLATT procedure was recommended.  The patient did not wish to follow-up for the procedure.  Last seizure 9 mo ago, this is managed by neurology.  She is not driving despite that greater than 6 months has gone by because of fears that she may not have adequate control.  Her lipid McDole dose has been adjusted Elliptical 23min In OP therapy , Aquatic at [facility] Walking out of [employer] fell  and broke patella in February, she has healed from this.  Pain Inventory Average Pain 8 Pain Right Now 0 My pain is sharp  In the last 24 hours, has pain interfered with the following? General activity 6 Relation with others 4 Enjoyment of life 7 What TIME of day is your pain at its worst? evening Sleep (in general) Good  Pain is worse with: walking, sitting, standing and some activites Pain improves with: other Relief from Meds: 0  Mobility walk without assistance ability to climb  steps?  yes do you drive?  yes  Function not employed: date last employed 2016 disabled: date disabled 2016 I need assistance with the following:  household duties  Neuro/Psych numbness tingling trouble walking spasms confusion depression  Prior Studies Any changes since last visit?  yes CT/MRI  Physicians involved in your care Primary care .Marland Kitchen Neurologist . Psychiatrist . Orthopedist . Psychologist .   Family History  Problem Relation Age of Onset  . Cancer Mother   . Cancer Father        pancreatic   Social History   Socioeconomic History  . Marital status: Married    Spouse name: Not on file  . Number of children: Not on file  . Years of education: Not on file  . Highest education level: Not on file  Occupational History  . Not on file  Social Needs  . Financial resource strain: Not on file  . Food insecurity    Worry: Not on file    Inability: Not on file  . Transportation needs    Medical: Not on file    Non-medical: Not on file  Tobacco Use  . Smoking status: Never Smoker  . Smokeless tobacco: Never Used  Substance and Sexual Activity  . Alcohol use: Yes    Alcohol/week: 1.0 standard drinks    Types: 1 Glasses of wine per week    Comment: casual   . Drug use: No  . Sexual activity: Yes    Birth control/protection: None    Comment: husband  has vasectomy  Lifestyle  . Physical activity    Days per week: Not on file    Minutes per session: Not on file  . Stress: Not on file  Relationships  . Social Herbalist on phone: Not on file    Gets together: Not on file    Attends religious service: Not on file    Active member of club or organization: Not on file    Attends meetings of clubs or organizations: Not on file    Relationship status: Not on file  Other Topics Concern  . Not on file  Social History Narrative  . Not on file   Past Surgical History:  Procedure Laterality Date  . DILATION AND CURETTAGE OF UTERUS     Past  Medical History:  Diagnosis Date  . Anxiety   . Depression   . ICH (intracerebral hemorrhage) ([place])   . Seizures ([place])   . Stroke ([place])    BP 96/61   Pulse 78   Temp 98.9 F (37.2 C)   Ht 5\' 3"  (1.6 m)   Wt 125 lb 6.4 oz (56.9 kg)   SpO2 97%   BMI 22.21 kg/m   Opioid Risk Score:   Fall Risk Score:  `1  Depression screen PHQ 2/9  Depression screen [facility] 2/9 03/25/2019 08/20/2017 05/04/2017  Decreased Interest 1 1 3   Down, Depressed, Hopeless 1 1 3   PHQ - 2 Score 2 2 6   Some recent data might be hidden    Review of Systems  Constitutional: Negative.   HENT: Negative.   Eyes: Negative.   Respiratory: Negative.   Cardiovascular: Negative.   Gastrointestinal: Positive for constipation.  Endocrine: Negative.   Genitourinary: Negative.   Musculoskeletal: Negative.   Skin: Negative.   Allergic/Immunologic: Negative.   Neurological: Negative.   Hematological: Negative.   Psychiatric/Behavioral: Negative.   All other systems reviewed and are negative.      Objective:   Physical Exam  Patient ambulates with AFO on the right she does have some knee hyperextension with cocontraction of the quadriceps and hamstrings.  She does have good foot clearance.  She does not use a cane. Without the AFO the patient has equina varus positioning she does not achieve heel strike.  Stance phase is on the forefoot lateral aspect. Musculoskeletal exam No evidence of knee effusion There is atrophy of the right quadricep gastroc as well as foot intrinsic musculature on the right. There is normal pulses on the right side.  Right foot is slightly cooler than the left foot.  There is no open lesions.  There is mild tenderness over the fifth metatarsal head as well as the base of the fifth metatarsal. Sensation, is able to identify which toe is touched but feels some paresthesias in the right foot compared to the left foot. Bone MAS 3/4 right gastrosoleus MAS 3/4 right tibialis posterior MAS 2/3  in toe flexors      Assessment & Plan:   1.  Right spastic hemiplegia with right foot contracture as well as tone.  We discussed nonoperative treatment options.  She will resume some therapy in the pool.  This should help with lower extremity strengthening and help prevent falls as well. We also discussed using a cane just to slow her down.  We also discussed mindfulness training as her falls have been mainly associated with rushing around and not taking her time We discussed nonsurgical treatment of her right lower extremity spasticity.  We  discussed that may have a better response to Xeomin than to Botox due to subtle differences in the molecule. We also discussed that if the Xeomin is not helpful, may consider surgical reevaluation.  Xeomin 300U Post tib 75U Gastroc 150U FDL 75  Over half of the 25 min visit was spent counseling and coordinating care.

## 2019-03-30 ENCOUNTER — Other Ambulatory Visit: Payer: Self-pay

## 2019-03-30 ENCOUNTER — Encounter: Payer: Self-pay | Admitting: Occupational Therapy

## 2019-03-30 ENCOUNTER — Ambulatory Visit: Payer: Medicare Other | Admitting: Occupational Therapy

## 2019-03-30 ENCOUNTER — Telehealth: Payer: Self-pay | Admitting: Neurology

## 2019-03-30 DIAGNOSIS — I69851 Hemiplegia and hemiparesis following other cerebrovascular disease affecting right dominant side: Secondary | ICD-10-CM

## 2019-03-30 DIAGNOSIS — M25611 Stiffness of right shoulder, not elsewhere classified: Secondary | ICD-10-CM

## 2019-03-30 DIAGNOSIS — R2681 Unsteadiness on feet: Secondary | ICD-10-CM

## 2019-03-30 DIAGNOSIS — R293 Abnormal posture: Secondary | ICD-10-CM | POA: Diagnosis not present

## 2019-03-30 MED ORDER — LAMOTRIGINE 200 MG PO TABS: 200.0000 mg | ORAL_TABLET | Freq: Two times a day (BID) | ORAL | 3 refills | Status: DC

## 2019-03-30 NOTE — Telephone Encounter (Signed)
Pt called stating LAMOTRIGEN 100 MG ONE AND A HALF DAILY INCREASED TO TWO PER DAY per Delice Lesch at last visit. Pt will need a new script she will run out before time for more. Pt uses [facility]. Pt phone [phone]. Pt has enough for about over a week.

## 2019-03-30 NOTE — Therapy (Signed)
[facility] [address] [place], [place], [address] Phone: [phone]   Fax:  [phone]  Occupational Therapy Treatment  Patient Details  Name: Ashley [name] MRN: [id number] Date of Birth: [birth year] Referring Provider (OT): Dr Marton Redwood   Encounter Date: 03/30/2019  OT End of Session - 03/30/19 2108    Visit Number  3    Number of Visits  8    Date for OT Re-Evaluation  05/04/19    Authorization Type  medicare with BCBS - will need PN every 10th visit    Authorization - Visit Number  3    Authorization - Number of Visits  10    OT Start Time  1055    OT Stop Time  1145    OT Time Calculation (min)  50 min    Activity Tolerance  Patient tolerated treatment well       Past Medical History:  Diagnosis Date  . Anxiety   . Depression   . ICH (intracerebral hemorrhage) ([place])   . Seizures ([place])   . Stroke [facility])     Past Surgical History:  Procedure Laterality Date  . DILATION AND CURETTAGE OF UTERUS      There were no vitals filed for this visit.  Subjective Assessment - 03/30/19 2104    Subjective   My leg and foot felt looser after the last session    Pertinent History  see epic snapshot; pt with SAH 2 weeks after deliverying her 3rd child    Patient Stated Goals  strength of arm and get my R leg working better    Currently in Pain?  No/denies         Patient seen for aquatic therapy today.  Treatment took place in water 2.5-4 feet deep depending upon activity.  Pt entered the pool via steps using 2 hand rails and moderate assistance in order to improve postural alignment and control and increase activity of RLE, trunk and RUE, decrease reliance on UE's.  Addressed R ankle alignment and ROM to allow for improved weight bearing over RLE with attention to postural alignment and control using aqua stretch techniques as well as ankle stretches on bottom step using air cast for lateral support and to  decrease supination of the foot.  Addressed activities to promote full weight bearing over RLE with hip, trunk and R shoulder in alignment of BOS.  Pt requires experience of "planned failure" in order to determine where her COG is over her BOS as well as to determine directionality of fall.  Addressed active dynamic weight shifting with forward and backward walking along pool wall with light single UE support - pt needs min facilitation to bring R hip over R foot as well as to complete full weight shift onto RLE before stepping with LLE.  Addressed core strength and UE strengthening in prone position using flotation devices and moderate support to use UE's to propel in water - used water and pulling body weight thru water as resistance.  Addressed functional ambulation using light BUE support in open water using dumb bell bar -  Water provides instant feedback when pt overuses UE support vs weigh shifting by advancing her COG over her BOS. Pt exited the water via steps using 2 hand rails and moderate facilitation for postural alignment and control with emphasis on powering up through LE"s instead of pulling with UE's.  OT Short Term Goals - 03/30/19 2105      OT SHORT TERM GOAL #1   Title  Pt will demonstrate improved overhead reach to at least 130* of shoulder flexion RUE  to assist with heavier overhead home mgmt tasks - 6/24//2020    Status  On-going      OT SHORT TERM GOAL #2   Title  Pt will demonstrate improved RUE strength /core strength to assist with lifting heavy items in grocery store with greater ease (per pt report)    Status  On-going        OT Long Term Goals - 03/30/19 2105      OT LONG TERM GOAL #1   Title  Pt will be mod I with upgraded HEP to include aquatic based HEP - 05/04/2019    Status  On-going      OT LONG TERM GOAL #2   Title  Pt will demonstrate improved postural alignment and control to reduce falls (has been falling at least  1x/month)    Status  On-going            Plan - 03/30/19 2106    Clinical Impression Statement  Pt progressing toward goals. Pt with slowly improving postural alignment in standing    OT Occupational Profile and History  Problem Focused Assessment - Including review of records relating to presenting problem    Occupational performance deficits (Please refer to evaluation for details):  IADL's;Leisure;Social Participation    Body Structure / Function / Physical Skills  Balance;Mobility;UE functional use;Strength;IADL;Tone;ROM;GMC    Rehab Potential  Good    Clinical Decision Making  Limited treatment options, no task modification necessary    Comorbidities Affecting Occupational Performance:  May have comorbidities impacting occupational performance    Modification or Assistance to Complete Evaluation   No modification of tasks or assist necessary to complete eval    OT Frequency  1x / week    OT Duration  8 weeks    OT Treatment/Interventions  Aquatic Therapy;Self-care/ADL training;Neuromuscular education;Therapeutic exercise;Functional Mobility Training;Balance training    Plan  aquatic therapy focused on NMR for RUE functional use, postural alignment and control, RLE strengtening and balance.     Consulted and Agree with Plan of Care  Patient       Patient will benefit from skilled therapeutic intervention in order to improve the following deficits and impairments:   Body Structure / Function / Physical Skills: Balance, Mobility, UE functional use, Strength, IADL, Tone, ROM, GMC       Visit Diagnosis: 1. Spastic hemiplegia of right dominant side as late effect of other cerebrovascular disease ([place])   2. Unsteadiness on feet   3. Abnormal posture   4. Stiffness of right shoulder, not elsewhere classified       Problem List Patient Active Problem List   Diagnosis Date Noted  . Depression 03/23/2017  . Anxiety 03/23/2017  . Muscle weakness 02/03/2017  . History of  hemorrhagic stroke with residual hemiparesis ([place]) 12/10/2016  . Spastic hemiplegia and hemiparesis affecting dominant side ([place]) 09/13/2015  . Adhesive capsulitis of right shoulder 08/24/2015  . Localization-related symptomatic epilepsy and epileptic syndromes with simple partial seizures, not intractable, without status epilepticus ([place]) 08/21/2015  . Nontraumatic cortical hemorrhage of cerebral hemisphere ([place])   . Seizure ([place])   . Seizures ([place])   . Adjustment disorder with depressed mood   . Contracture of muscle ankle and foot 06/11/2015  . Hemiplga fol ntrm intcrbl hemor aff right dominant  side ([place]) 06/06/2015  . Left-sided intracerebral hemorrhage ([place]) 06/04/2015  . Seizure disorder as sequela of cerebrovascular accident ([place])   . Seizure disorder ([place]) 06/03/2015  . Sepsis ([place]) 06/03/2015  . UTI (urinary tract infection) 06/03/2015  . Hypotension 06/02/2015  . Right spastic hemiparesis ([place]) 05/28/2015  . Aphasia following nontraumatic intracerebral hemorrhage 05/28/2015  . History of anxiety disorder 05/28/2015  . ICH (intracerebral hemorrhage) ([place])   . Seizure disorder, nonconvulsive, with status epilepticus ([place])   . Cerebral venous thrombosis of cortical vein   . Cytotoxic cerebral edema ([place])   . IVH (intraventricular hemorrhage) ([place])   . Term pregnancy 05/07/2015  . Spontaneous vaginal delivery 05/07/2015    Quay Burow, OTR/L 03/30/2019, 9:09 PM  [facility] [address] [place], [place], [address] Phone: [phone]   Fax:  [phone]  Name: Ashley [name] MRN: [id number] Date of Birth: 1976/03/28

## 2019-03-30 NOTE — Telephone Encounter (Signed)
Pt called informed that script was called in for lamotrigen 200mg  tablets so she will be taken 1 tab twice a day instead of 2 tablets twice a day. Pt verbalized understanding

## 2019-04-06 ENCOUNTER — Other Ambulatory Visit: Payer: Self-pay

## 2019-04-06 ENCOUNTER — Encounter: Payer: Self-pay | Admitting: Occupational Therapy

## 2019-04-06 ENCOUNTER — Ambulatory Visit: Payer: Medicare Other | Admitting: Occupational Therapy

## 2019-04-06 DIAGNOSIS — I69851 Hemiplegia and hemiparesis following other cerebrovascular disease affecting right dominant side: Secondary | ICD-10-CM

## 2019-04-06 DIAGNOSIS — R2681 Unsteadiness on feet: Secondary | ICD-10-CM | POA: Diagnosis not present

## 2019-04-06 DIAGNOSIS — R293 Abnormal posture: Secondary | ICD-10-CM

## 2019-04-06 DIAGNOSIS — M25611 Stiffness of right shoulder, not elsewhere classified: Secondary | ICD-10-CM

## 2019-04-06 NOTE — Therapy (Signed)
[facility] [address] [place], [place], [address] Phone: [phone]   Fax:  [phone]  Occupational Therapy Treatment  Patient Details  Name: Ashley [name] MRN: [id number] Date of Birth: 1976-01-10 Referring Provider (OT): Dr Marton Redwood   Encounter Date: 04/06/2019  OT End of Session - 04/06/19 1550    Visit Number  4    Number of Visits  8    Date for OT Re-Evaluation  05/18/19    Authorization Type  medicare with BCBS - will need PN every 10th visit    Authorization - Visit Number  4    Authorization - Number of Visits  10    OT Start Time  1046    OT Stop Time  1140    OT Time Calculation (min)  54 min    Activity Tolerance  Patient tolerated treatment well       Past Medical History:  Diagnosis Date  . Anxiety   . Depression   . ICH (intracerebral hemorrhage) ([place])   . Seizures ([place])   . Stroke [facility])     Past Surgical History:  Procedure Laterality Date  . DILATION AND CURETTAGE OF UTERUS      There were no vitals filed for this visit.  Subjective Assessment - 04/06/19 1541    Subjective   I can tell after I am in the pool that my foot gets closer to being flat on the ground    Pertinent History  see epic snapshot; pt with SAH 2 weeks after deliverying her 3rd child    Patient Stated Goals  strength of arm and get my R leg working better    Currently in Pain?  No/denies         Patient seen for aquatic therapy today.  Treatment took place in water 2.5-4 feet deep depending upon activity.  Pt entered the pool via steps using 1 hand rail and only vc's for postural alignment and control descending the stairs.  Aqua stretch techniques as well as weight bearing in shallow water to improve ankle alignment and ROM prior to further movement with improving results.  Treatment addressed active weight shifting onto RLE with specific emphasis on pelvic/trunk/shoulder alignment with reduced reliance on UE  support.  Pt needs min facilitation and mod cues - pt with need for increased repetition due to apraxia and poor sensation of R side.  Also addressed RUE proximal strengthening in standing in open water using moderate dumb bell and focusing on shoulder extension (15 reps x2). with holding to address scapular stability in order to improve overall strength of RUE with heavy activity - increased demand on trunk control/balance as well by eliminating UE support during this activity.  Addressed isolated RLE control and strengthening using small noodle and "marching" of RLE on noodle - pt had significant difficulty isolating the movement to complete this task.  Removed noodle to decrease resistance and provided moderate facilitation using deep water only for resistance ( 15 reps x2). Also addressed R hip ER with hip and knee flexed ( 15 reps x2) with moderate facilitation.  Pt also asked to "hold" RLE in position of 90* of hip flexion and 90* of knee flexion against buoyancy of the water - initially moderate difficulty however with practice pt improved to only needing mod vc's.  Open water functional ambulation to incorporate alignment and control with no UE and min facilitation.  Pt exited the pool via steps using 1 hand rail and min  cues for alignment.  Practiced carry over of water techniques on land for postural alignment and control during functional ambulation.                       OT Short Term Goals - 04/06/19 1542      OT SHORT TERM GOAL #1   Title  Pt will demonstrate improved overhead reach to at least 130* of shoulder flexion RUE  to assist with heavier overhead home mgmt tasks -7/8//2020 (goal date adjusted to meet 4 visits)    Status  On-going      OT SHORT TERM GOAL #2   Title  Pt will demonstrate improved RUE strength /core strength to assist with lifting heavy items in grocery store with greater ease (per pt report)    Status  On-going        OT Long Term Goals -  04/06/19 1543      OT LONG TERM GOAL #1   Title  Pt will be mod I with upgraded HEP to include aquatic based HEP - 05/18/2019 (goal date adjusted to meet 8 visits)    Status  On-going      OT LONG TERM GOAL #2   Title  Pt will demonstrate improved postural alignment and control to reduce falls (has been falling at least 1x/month)    Status  On-going            Plan - 04/06/19 1545    Clinical Impression Statement  Pt progresing toward goals.  Pt with improving ability to shift onto RLE and to activate R side of trunk and stabilize with R proximal shoulder girdle.    OT Occupational Profile and History  Problem Focused Assessment - Including review of records relating to presenting problem    Occupational performance deficits (Please refer to evaluation for details):  IADL's;Leisure;Social Participation    Body Structure / Function / Physical Skills  Balance;Mobility;UE functional use;Strength;IADL;Tone;ROM;GMC    Rehab Potential  Good    Clinical Decision Making  Limited treatment options, no task modification necessary    Comorbidities Affecting Occupational Performance:  May have comorbidities impacting occupational performance    Modification or Assistance to Complete Evaluation   No modification of tasks or assist necessary to complete eval    OT Frequency  1x / week    OT Duration  8 weeks    OT Treatment/Interventions  Aquatic Therapy;Self-care/ADL training;Neuromuscular education;Therapeutic exercise;Functional Mobility Training;Balance training    Plan  aquatic therapy focused on NMR for RUE functional use, postural alignment and control, RLE strengtening and balance.     Consulted and Agree with Plan of Care  Patient       Patient will benefit from skilled therapeutic intervention in order to improve the following deficits and impairments:   Body Structure / Function / Physical Skills: Balance, Mobility, UE functional use, Strength, IADL, Tone, ROM, GMC       Visit  Diagnosis: 1. Spastic hemiplegia of right dominant side as late effect of other cerebrovascular disease ([place])   2. Unsteadiness on feet   3. Abnormal posture   4. Stiffness of right shoulder, not elsewhere classified       Problem List Patient Active Problem List   Diagnosis Date Noted  . Depression 03/23/2017  . Anxiety 03/23/2017  . Muscle weakness 02/03/2017  . History of hemorrhagic stroke with residual hemiparesis ([place]) 12/10/2016  . Spastic hemiplegia and hemiparesis affecting dominant side ([place]) 09/13/2015  . Adhesive capsulitis  of right shoulder 08/24/2015  . Localization-related symptomatic epilepsy and epileptic syndromes with simple partial seizures, not intractable, without status epilepticus ([place]) 08/21/2015  . Nontraumatic cortical hemorrhage of cerebral hemisphere ([place])   . Seizure ([place])   . Seizures ([place])   . Adjustment disorder with depressed mood   . Contracture of muscle ankle and foot 06/11/2015  . Hemiplga fol ntrm intcrbl hemor aff right dominant side ([place]) 06/06/2015  . Left-sided intracerebral hemorrhage ([place]) 06/04/2015  . Seizure disorder as sequela of cerebrovascular accident ([place])   . Seizure disorder ([place]) 06/03/2015  . Sepsis ([place]) 06/03/2015  . UTI (urinary tract infection) 06/03/2015  . Hypotension 06/02/2015  . Right spastic hemiparesis ([place]) 05/28/2015  . Aphasia following nontraumatic intracerebral hemorrhage 05/28/2015  . History of anxiety disorder 05/28/2015  . ICH (intracerebral hemorrhage) ([place])   . Seizure disorder, nonconvulsive, with status epilepticus ([place])   . Cerebral venous thrombosis of cortical vein   . Cytotoxic cerebral edema ([place])   . IVH (intraventricular hemorrhage) ([place])   . Term pregnancy 05/07/2015  . Spontaneous vaginal delivery 05/07/2015    Quay Burow, OTR/L 04/06/2019, 3:51 PM  [facility] [address] [place], [place], [address] Phone:  [phone]   Fax:  [phone]  Name: Ashley [name] MRN: [id number] Date of Birth: [birth year]

## 2019-04-08 DIAGNOSIS — S82034D Nondisplaced transverse fracture of right patella, subsequent encounter for closed fracture with routine healing: Secondary | ICD-10-CM | POA: Diagnosis not present

## 2019-04-13 ENCOUNTER — Encounter: Payer: Medicare Other | Admitting: Occupational Therapy

## 2019-04-20 ENCOUNTER — Ambulatory Visit: Payer: Medicare Other | Attending: Physical Medicine & Rehabilitation | Admitting: Occupational Therapy

## 2019-04-20 ENCOUNTER — Encounter: Payer: Self-pay | Admitting: Occupational Therapy

## 2019-04-20 ENCOUNTER — Other Ambulatory Visit: Payer: Self-pay

## 2019-04-20 DIAGNOSIS — M6281 Muscle weakness (generalized): Secondary | ICD-10-CM | POA: Insufficient documentation

## 2019-04-20 DIAGNOSIS — R2681 Unsteadiness on feet: Secondary | ICD-10-CM

## 2019-04-20 DIAGNOSIS — R296 Repeated falls: Secondary | ICD-10-CM | POA: Insufficient documentation

## 2019-04-20 DIAGNOSIS — M25611 Stiffness of right shoulder, not elsewhere classified: Secondary | ICD-10-CM

## 2019-04-20 DIAGNOSIS — I69851 Hemiplegia and hemiparesis following other cerebrovascular disease affecting right dominant side: Secondary | ICD-10-CM | POA: Diagnosis not present

## 2019-04-20 DIAGNOSIS — R293 Abnormal posture: Secondary | ICD-10-CM | POA: Diagnosis not present

## 2019-04-20 DIAGNOSIS — M25671 Stiffness of right ankle, not elsewhere classified: Secondary | ICD-10-CM | POA: Diagnosis not present

## 2019-04-20 NOTE — Therapy (Signed)
[facility] [address] [place], [place], [address] Phone: [phone]   Fax:  [phone]  Occupational Therapy Treatment  Patient Details  Name: Ashley [name] MRN: [id number] Date of Birth: 11-12-1975 Referring Provider (OT): Dr Marton Redwood   Encounter Date: 04/20/2019  OT End of Session - 04/20/19 1426    Visit Number  5    Number of Visits  8    Date for OT Re-Evaluation  05/18/19    Authorization Type  medicare with BCBS - will need PN every 10th visit    Authorization - Visit Number  5    Authorization - Number of Visits  10    OT Start Time  1101    OT Stop Time  1148    OT Time Calculation (min)  47 min    Activity Tolerance  Patient tolerated treatment well       Past Medical History:  Diagnosis Date  . Anxiety   . Depression   . ICH (intracerebral hemorrhage) ([place])   . Seizures ([place])   . Stroke [facility])     Past Surgical History:  Procedure Laterality Date  . DILATION AND CURETTAGE OF UTERUS      There were no vitals filed for this visit.  Subjective Assessment - 04/20/19 1423    Subjective   I went to the beach with my family and I was able to walk on the beach    Pertinent History  see epic snapshot; pt with SAH 2 weeks after deliverying her 3rd child    Patient Stated Goals  strength of arm and get my R leg working better    Currently in Pain?  No/denies       Patient seen for aquatic therapy today.  Treatment took place in water 2.5-4 feet deep depending upon activity.  Pt entered the pool via steps using 1 hand rails, distant supervision and min cues for postural alignment.  Addressed R ankle ROM using aqua stretch techniques as well as weight bearing in standing with weight shifting onto aligned RLE to increase dorsiflexion. Pt with slowly improving ROM and alignment in R ankle.  Addressed postural alignment first in standing with lateral weight shifts to the left while maintaining hip  extension.  Then progressed to lateral weight shift forward onto RLE in partial tandem stance again with emphasis on hip extension with full transition onto RLE first with single UE support and progressing to no UE support. Pt initially required mod facilitation however with practice able to progress to mod cues and intermittent min facilitation.  Progressed to functional ambulation in open water with only light UE support and incorporation of alignment of R hip and full weight shift onto RLE with min facilitation.  Transitioned into supine using flotation devices and utilized [place] techniques to address core strength and postural alignment with holding of positions.  Transitioned into prone with flotation devices and addressed postural alignment/control and continuous activation of R trunk, RUE and RLE to float and hold for 30 seconds with min a.  Pt exited pool via steps using 1 hand rail and completing step over step with distant supervision and min cues.                         OT Short Term Goals - 04/20/19 1424      OT SHORT TERM GOAL #1   Title  Pt will demonstrate improved overhead reach to at least  130* of shoulder flexion RUE  to assist with heavier overhead home mgmt tasks -7/8//2020 (goal date adjusted to meet 4 visits)    Status  On-going      OT SHORT TERM GOAL #2   Title  Pt will demonstrate improved RUE strength /core strength to assist with lifting heavy items in grocery store with greater ease (per pt report)    Status  On-going        OT Long Term Goals - 04/20/19 1424      OT LONG TERM GOAL #1   Title  Pt will be mod I with upgraded HEP to include aquatic based HEP - 05/18/2019 (goal date adjusted to meet 8 visits)    Status  On-going      OT LONG TERM GOAL #2   Title  Pt will demonstrate improved postural alignment and control to reduce falls (has been falling at least 1x/month)    Status  On-going            Plan - 04/20/19 1424     Clinical Impression Statement  Pt continues to progress toward goals with slowly improving postural alignment and control. Pt benefits from significant repetition due to poor sensation and learned poor motor patterns    OT Occupational Profile and History  Problem Focused Assessment - Including review of records relating to presenting problem    Occupational performance deficits (Please refer to evaluation for details):  IADL's;Leisure;Social Participation    Body Structure / Function / Physical Skills  Balance;Mobility;UE functional use;Strength;IADL;Tone;ROM;GMC    Rehab Potential  Good    Clinical Decision Making  Limited treatment options, no task modification necessary    Comorbidities Affecting Occupational Performance:  May have comorbidities impacting occupational performance    Modification or Assistance to Complete Evaluation   No modification of tasks or assist necessary to complete eval    OT Frequency  1x / week    OT Duration  8 weeks    OT Treatment/Interventions  Aquatic Therapy;Self-care/ADL training;Neuromuscular education;Therapeutic exercise;Functional Mobility Training;Balance training    Plan  aquatic therapy focused on NMR for RUE functional use, postural alignment and control, RLE strengtening and balance.     Consulted and Agree with Plan of Care  Patient       Patient will benefit from skilled therapeutic intervention in order to improve the following deficits and impairments:   Body Structure / Function / Physical Skills: Balance, Mobility, UE functional use, Strength, IADL, Tone, ROM, GMC       Visit Diagnosis: 1. Spastic hemiplegia of right dominant side as late effect of other cerebrovascular disease ([place])   2. Unsteadiness on feet   3. Abnormal posture   4. Stiffness of right shoulder, not elsewhere classified       Problem List Patient Active Problem List   Diagnosis Date Noted  . Depression 03/23/2017  . Anxiety 03/23/2017  . Muscle weakness  02/03/2017  . History of hemorrhagic stroke with residual hemiparesis ([place]) 12/10/2016  . Spastic hemiplegia and hemiparesis affecting dominant side ([place]) 09/13/2015  . Adhesive capsulitis of right shoulder 08/24/2015  . Localization-related symptomatic epilepsy and epileptic syndromes with simple partial seizures, not intractable, without status epilepticus ([place]) 08/21/2015  . Nontraumatic cortical hemorrhage of cerebral hemisphere ([place])   . Seizure ([place])   . Seizures ([place])   . Adjustment disorder with depressed mood   . Contracture of muscle ankle and foot 06/11/2015  . Hemiplga fol ntrm intcrbl hemor aff right dominant side ([place])  06/06/2015  . Left-sided intracerebral hemorrhage ([place]) 06/04/2015  . Seizure disorder as sequela of cerebrovascular accident ([place])   . Seizure disorder ([place]) 06/03/2015  . Sepsis ([place]) 06/03/2015  . UTI (urinary tract infection) 06/03/2015  . Hypotension 06/02/2015  . Right spastic hemiparesis ([place]) 05/28/2015  . Aphasia following nontraumatic intracerebral hemorrhage 05/28/2015  . History of anxiety disorder 05/28/2015  . ICH (intracerebral hemorrhage) ([place])   . Seizure disorder, nonconvulsive, with status epilepticus ([place])   . Cerebral venous thrombosis of cortical vein   . Cytotoxic cerebral edema ([place])   . IVH (intraventricular hemorrhage) ([place])   . Term pregnancy 05/07/2015  . Spontaneous vaginal delivery 05/07/2015    Quay Burow, OTR/L 04/20/2019, 2:27 PM  [facility] [address] [place], [place], [address] Phone: [phone]   Fax:  [phone]  Name: Ashley [name] MRN: [id number] Date of Birth: Nov 30, 1975

## 2019-04-23 ENCOUNTER — Encounter (HOSPITAL_COMMUNITY): Payer: Self-pay | Admitting: *Deleted

## 2019-04-23 ENCOUNTER — Other Ambulatory Visit: Payer: Self-pay

## 2019-04-23 ENCOUNTER — Emergency Department (HOSPITAL_COMMUNITY)
Admission: EM | Admit: 2019-04-23 | Discharge: 2019-04-23 | Disposition: A | Discharge status: Home / Self Care | Payer: Medicare Other | Attending: Emergency Medicine | Admitting: Emergency Medicine

## 2019-04-23 DIAGNOSIS — Z79899 Other long term (current) drug therapy: Secondary | ICD-10-CM | POA: Diagnosis not present

## 2019-04-23 DIAGNOSIS — R569 Unspecified convulsions: Secondary | ICD-10-CM | POA: Diagnosis not present

## 2019-04-23 DIAGNOSIS — R000 Tachycardia, unspecified: Secondary | ICD-10-CM | POA: Diagnosis not present

## 2019-04-23 DIAGNOSIS — F419 Anxiety disorder, unspecified: Secondary | ICD-10-CM | POA: Insufficient documentation

## 2019-04-23 DIAGNOSIS — G40909 Epilepsy, unspecified, not intractable, without status epilepticus: Secondary | ICD-10-CM | POA: Insufficient documentation

## 2019-04-23 DIAGNOSIS — G8111 Spastic hemiplegia affecting right dominant side: Secondary | ICD-10-CM | POA: Insufficient documentation

## 2019-04-23 DIAGNOSIS — Z7982 Long term (current) use of aspirin: Secondary | ICD-10-CM | POA: Diagnosis not present

## 2019-04-23 LAB — CBC WITH DIFFERENTIAL/PLATELET
Abs Immature Granulocytes: 0.02 10*3/uL (ref 0.00–0.07)
Basophils Absolute: 0 10*3/uL (ref 0.0–0.1)
Basophils Relative: 0 %
Eosinophils Absolute: 0.1 10*3/uL (ref 0.0–0.5)
Eosinophils Relative: 1 %
HCT: 36.1 % (ref 36.0–46.0)
Hemoglobin: 12.1 g/dL (ref 12.0–15.0)
Immature Granulocytes: 0 %
Lymphocytes Relative: 15 %
Lymphs Abs: 0.9 10*3/uL (ref 0.7–4.0)
MCH: 32.2 pg (ref 26.0–34.0)
MCHC: 33.5 g/dL (ref 30.0–36.0)
MCV: 96 fL (ref 80.0–100.0)
Monocytes Absolute: 0.4 10*3/uL (ref 0.1–1.0)
Monocytes Relative: 7 %
Neutro Abs: 4.2 10*3/uL (ref 1.7–7.7)
Neutrophils Relative %: 77 %
Platelets: 200 10*3/uL (ref 150–400)
RBC: 3.76 MIL/uL — ABNORMAL LOW (ref 3.87–5.11)
RDW: 12 % (ref 11.5–15.5)
WBC: 5.6 10*3/uL (ref 4.0–10.5)
nRBC: 0 % (ref 0.0–0.2)

## 2019-04-23 LAB — BASIC METABOLIC PANEL
Anion gap: 9 (ref 5–15)
BUN: 15 mg/dL (ref 6–20)
CO2: 26 mmol/L (ref 22–32)
Calcium: 9.4 mg/dL (ref 8.9–10.3)
Chloride: 101 mmol/L (ref 98–111)
Creatinine, Ser: 0.69 mg/dL (ref 0.44–1.00)
GFR calc Af Amer: >60 mL/min (ref >60)
GFR calc non Af Amer: >60 mL/min (ref >60)
Glucose, Bld: 87 mg/dL (ref 70–99)
Potassium: 4.6 mmol/L (ref 3.5–5.1)
Sodium: 136 mmol/L (ref 135–145)

## 2019-04-23 MED ORDER — ACETAMINOPHEN 325 MG PO TABS
650.0000 mg | ORAL_TABLET | Freq: Once | ORAL | Status: AC
  Administered 2019-04-23: 650 mg via ORAL
  Filled 2019-04-23: qty 2

## 2019-04-23 NOTE — Discharge Instructions (Addendum)
I discussed your case with our on call neurologist. We are not making any medication adjustments at this time but do want you to take another 2mg  of ativan this evening. We checked a lamictal level although the results wont be back today. This may help your neurologist though when you follow up.

## 2019-04-23 NOTE — ED Triage Notes (Signed)
PT reports Same occurrence on Friday and took 2 mg Ativan with help of muscle jerking..Pt took 3 mg of Ativan today before calling EMS Pt reports she has had same jerking of and the Meds for seizures were increased.

## 2019-04-23 NOTE — ED Provider Notes (Signed)
[facility] EMERGENCY DEPARTMENT Provider Note   CSN: [id number] Arrival date & time: 04/23/19  1014     History   Chief Complaint Chief Complaint  Patient presents with  . Anxiety    HPI Ashley [name] is a 43 y.o. female.     HPI   37yF with seizure like activity. Hx of ICH in 2016 shortly post-partum. Residual R foot drop as a result and seizures. Currently on Keppra 1500 mg BID and lamictal 200 mg BID. Today was sitting with son eating breakfast when R side began feeling numb/tingling. She felt like she was going to have a seizure so took ativan. Symptoms persisted and took a total of 3 mg of ativan before EMS called. Reports shaking in right upper and lower ext which has since improved. She still has the numbness.   Past Medical History:  Diagnosis Date  . Anxiety   . Depression   . ICH (intracerebral hemorrhage) ([place])   . Seizures ([place])   . Stroke [facility])     Patient Active Problem List   Diagnosis Date Noted  . Depression 03/23/2017  . Anxiety 03/23/2017  . Muscle weakness 02/03/2017  . History of hemorrhagic stroke with residual hemiparesis ([place]) 12/10/2016  . Spastic hemiplegia and hemiparesis affecting dominant side ([place]) 09/13/2015  . Adhesive capsulitis of right shoulder 08/24/2015  . Localization-related symptomatic epilepsy and epileptic syndromes with simple partial seizures, not intractable, without status epilepticus ([place]) 08/21/2015  . Nontraumatic cortical hemorrhage of cerebral hemisphere ([place])   . Seizure ([place])   . Seizures ([place])   . Adjustment disorder with depressed mood   . Contracture of muscle ankle and foot 06/11/2015  . Hemiplga fol ntrm intcrbl hemor aff right dominant side ([place]) 06/06/2015  . Left-sided intracerebral hemorrhage ([place]) 06/04/2015  . Seizure disorder as sequela of cerebrovascular accident ([place])   . Seizure disorder ([place]) 06/03/2015  . Sepsis ([place]) 06/03/2015  . UTI (urinary tract infection) 06/03/2015  .  Hypotension 06/02/2015  . Right spastic hemiparesis ([place]) 05/28/2015  . Aphasia following nontraumatic intracerebral hemorrhage 05/28/2015  . History of anxiety disorder 05/28/2015  . ICH (intracerebral hemorrhage) ([place])   . Seizure disorder, nonconvulsive, with status epilepticus ([place])   . Cerebral venous thrombosis of cortical vein   . Cytotoxic cerebral edema ([place])   . IVH (intraventricular hemorrhage) ([place])   . Term pregnancy 05/07/2015  . Spontaneous vaginal delivery 05/07/2015    Past Surgical History:  Procedure Laterality Date  . DILATION AND CURETTAGE OF UTERUS       OB History    Gravida  9   Para  3   Term  3   Preterm      AB  6   Living  3     SAB  3   TAB  2   Ectopic      Multiple  0   Live Births  3            Home Medications    Prior to Admission medications   Medication Sig Start Date End Date Taking? Authorizing Provider  aspirin 81 MG chewable tablet Chew 81 mg by mouth daily.    [provider]  Cholecalciferol (VITAMIN D) 2000 units CAPS Take 2,000 Units by mouth daily.    [provider]  FLUoxetine (PROZAC) 20 MG capsule Take 1 capsule (20 mg total) by mouth daily. Take with 40 mg for total of 60 mg daily 06/08/18 06/08/19  Eksir, Richard Miu,  MD  FLUoxetine (PROZAC) 40 MG capsule Take 1 capsule (40 mg total) by mouth daily. 06/08/18   Eksir, Richard Miu, MD  lamoTRIgine (LAMICTAL) 200 MG tablet Take 1 tablet (200 mg total) by mouth 2 (two) times daily. Take 200 mg BID 03/30/19   Cameron Sprang, MD  levETIRAcetam (KEPPRA) 750 MG tablet Take 2 tablets (1,500 mg total) by mouth 2 (two) times daily. 10/14/18   Cameron Sprang, MD  LORazepam (ATIVAN) 1 MG tablet Take 1 tablet as needed for seizures 11/26/18   Cameron Sprang, MD  magnesium oxide (MAG-OX) 400 MG tablet Take 400 mg by mouth 2 (two) times daily.    [provider]  mirtazapine (REMERON) 30 MG tablet Take 1 tablet (30 mg total) by mouth at  bedtime. 06/08/18 06/08/19  Aundra Dubin, MD  Multiple Vitamin (MULTIVITAMIN) tablet Take 1 tablet by mouth daily.     [provider]  Plecanatide (TRULANCE) 3 MG TABS Take by mouth.    [provider]  Sulfamethoxazole-Trimethoprim (BACTRIM PO) Take 160 mg by mouth daily.    [provider]    Family History Family History  Problem Relation Age of Onset  . Cancer Mother   . Cancer Father        pancreatic    Social History Social History   Tobacco Use  . Smoking status: Never Smoker  . Smokeless tobacco: Never Used  Substance Use Topics  . Alcohol use: Yes    Alcohol/week: 1.0 standard drinks    Types: 1 Glasses of wine per week    Comment: casual   . Drug use: No     Allergies   Patient has no known allergies.   Review of Systems Review of Systems  All systems reviewed and negative, other than as noted in HPI.  Physical Exam Updated Vital Signs There were no vitals taken for this visit.  Physical Exam Vitals signs and nursing note reviewed.  Constitutional:      General: She is not in acute distress.    Appearance: She is well-developed.  HENT:     Head: Normocephalic and atraumatic.  Eyes:     General:        Right eye: No discharge.        Left eye: No discharge.     Conjunctiva/sclera: Conjunctivae normal.  Neck:     Musculoskeletal: Neck supple.  Cardiovascular:     Rate and Rhythm: Normal rate and regular rhythm.     Heart sounds: Normal heart sounds. No murmur. No friction rub. No gallop.   Pulmonary:     Effort: Pulmonary effort is normal. No respiratory distress.     Breath sounds: Normal breath sounds.  Abdominal:     General: There is no distension.     Palpations: Abdomen is soft.     Tenderness: There is no abdominal tenderness.  Musculoskeletal:        General: No tenderness.  Skin:    General: Skin is warm and dry.  Neurological:     Mental Status: She is alert.     Comments: Alert and oriented  x3.Marland Kitchen  Speech is sometimes stuttering but when asked her to speak more slowly it becomes more fluent.  Shaking in all extremities which abates with movement.  Strength is 5 out of 5 upper and lower extremities with exception of known right foot drop.  Psychiatric:     Comments: Seems anxious.  Crying at times.  ED Treatments / Results  Labs (all labs ordered are listed, but only abnormal results are displayed) Labs Reviewed  CBC WITH DIFFERENTIAL/PLATELET - Abnormal; Notable for the following components:      Result Value   RBC 3.76 (*)    All other components within normal limits  BASIC METABOLIC PANEL  LAMOTRIGINE LEVEL    EKG EKG Interpretation  Date/Time:  Saturday April 23 2019 10:22:12 EDT Ventricular Rate:  93 PR Interval:    QRS Duration: 99 QT Interval:  357 QTC Calculation: 444 R Axis:   75 Text Interpretation:  Sinus rhythm Confirmed by Virgel Manifold [phone]) on 04/23/2019 11:51:24 AM   Radiology No results found.  Procedures Procedures (including critical care time)  Medications Ordered in ED Medications - No data to display   Initial Impression / Assessment and Plan / ED Course  I have reviewed the triage vital signs and the nursing notes.  Pertinent labs & imaging results that were available during my care of the patient were reviewed by me and considered in my medical decision making (see chart for details).       42yF with what sounds like seizure. There may be some anxiety component although she known organic disease. Her speech is stuttering at times but fluent when having her slow down and reassuring her. Hx of hyponatremia but apparently this was when on oxcarbazepine whihc she no longer takes. I think reasonable to check electrolytes.  Will check lamictal level as it may be of some utility when she follows up with her neurologist. Her symptoms have been progressively improving and I suspect she will likely be discharged. She took 3 mg of ativan  prior to arrival. Will continue to observe at this time.   Briefly discussed with on call neurologist. Questions Todd's paralysis and that with underlying structural lesion that sometimes improvement to baseline takes longer. Meds reviewed and advised to keep the same but take an additional 2mg  of ativan this evening if discharged.   Final Clinical Impressions(s) / ED Diagnoses   Final diagnoses:  Seizure disorder [facility])    ED Discharge Orders    None       Virgel Manifold, MD 04/27/19 1004

## 2019-04-23 NOTE — ED Notes (Signed)
Help get patient on the monitor did ekg shown to Dr Wilson Singer patient is resting with call bell in reach

## 2019-04-23 NOTE — ED Triage Notes (Signed)
PT reports whole RT side  Is numb

## 2019-04-23 NOTE — ED Triage Notes (Addendum)
TC to [name] with up date

## 2019-04-25 ENCOUNTER — Telehealth: Payer: Self-pay | Admitting: Neurology

## 2019-04-25 ENCOUNTER — Encounter: Payer: Medicare Other | Admitting: Physical Medicine & Rehabilitation

## 2019-04-25 NOTE — Telephone Encounter (Signed)
Spoke with husband. Informed him to decrease Lamictal back to 200mg  BID.   While waiting on Dr. Amparo Bristol response I spoke with Truman Hayward at [facility] and the Manufacturer of lamictal has not changed the past two refills the pt has purchased.  The only thing that has changed is that they went from a 100mg  tablet to a 200mg  tablet. The husband states that the pt has been used to taking two of the 100mg  tablets twice daily and with the new prescription being the 200mg  she was not paying attention and was actually doubling the dose.

## 2019-04-25 NOTE — Telephone Encounter (Signed)
Pts husband returned call. He wanted [place] to know that pt has been taking 400mg  of lamictal in the morning and 400mg  in the evening. Could this be what is causing the seizures? Or the change in drug manufacturer? Husband wanted me to make Dr. Delice Lesch aware of this prior to calling pharmacy.

## 2019-04-25 NOTE — Telephone Encounter (Signed)
Spoke to husband. She has had a change in her seizures, she had been doing really well with addition of Lamotrigine to Keppra, however last Friday, she started having right-sided jerking and weakness. She took 3mg  Ativan last Friday and went to the ER. She had jerking as well on Sat, yesterday, and this morning. She just took 1mg  Ativan now. They report that she filled her Lamotrigine prescription last week and the pills were different (different manufacturer). Discussed that seizures could occur with change in manufacturers, instructed her to increase Lamotrigine 200mg : Take 1.5 tabs BID with her current bottle, a new Rx will be sent to her pharmacy specifying to fill only from the prior generic manufacturer.

## 2019-04-25 NOTE — Telephone Encounter (Signed)
Some medicines at very high concentrations can increase seizures, not quite sure this is what is going on with Ashley [name] but would decrease dose then to the original 200mg  twice a day.

## 2019-04-25 NOTE — Telephone Encounter (Signed)
New Message  Patient's husband verbalized he is needing to speak with the doctor in regards to his wife having seizures as of right now.  Patient's husband verbalized patient's wife had a seizure and a stroke recently that hospitalized patient.

## 2019-04-26 LAB — LAMOTRIGINE LEVEL: Lamotrigine Lvl: 17.3 ug/mL (ref 2.0–20.0)

## 2019-04-27 ENCOUNTER — Encounter: Payer: Self-pay | Admitting: Occupational Therapy

## 2019-04-27 ENCOUNTER — Other Ambulatory Visit: Payer: Self-pay

## 2019-04-27 ENCOUNTER — Ambulatory Visit: Payer: Medicare Other | Admitting: Occupational Therapy

## 2019-04-27 DIAGNOSIS — R293 Abnormal posture: Secondary | ICD-10-CM | POA: Diagnosis not present

## 2019-04-27 DIAGNOSIS — I69851 Hemiplegia and hemiparesis following other cerebrovascular disease affecting right dominant side: Secondary | ICD-10-CM

## 2019-04-27 DIAGNOSIS — R296 Repeated falls: Secondary | ICD-10-CM | POA: Diagnosis not present

## 2019-04-27 DIAGNOSIS — M25611 Stiffness of right shoulder, not elsewhere classified: Secondary | ICD-10-CM | POA: Diagnosis not present

## 2019-04-27 DIAGNOSIS — R2681 Unsteadiness on feet: Secondary | ICD-10-CM

## 2019-04-27 DIAGNOSIS — M6281 Muscle weakness (generalized): Secondary | ICD-10-CM | POA: Diagnosis not present

## 2019-04-27 DIAGNOSIS — M25671 Stiffness of right ankle, not elsewhere classified: Secondary | ICD-10-CM | POA: Diagnosis not present

## 2019-04-27 NOTE — Therapy (Signed)
[facility] [address] [place], [place], [address] Phone: [phone]   Fax:  [phone]  Occupational Therapy Treatment  Patient Details  Name: Ashley [name] MRN: [id number] Date of Birth: 03-Jul-1976 Referring Provider (OT): Dr Marton Redwood   Encounter Date: 04/27/2019  OT End of Session - 04/27/19 1421    Visit Number  6    Number of Visits  8    Date for OT Re-Evaluation  05/18/19    Authorization Type  medicare with [employer] - will need PN every 10th visit    Authorization - Visit Number  6    Authorization - Number of Visits  10    OT Start Time  1057    OT Stop Time  1145    OT Time Calculation (min)  48 min    Activity Tolerance  Patient tolerated treatment well       Past Medical History:  Diagnosis Date  . Anxiety   . Depression   . ICH (intracerebral hemorrhage) ([place])   . Seizures ([place])   . Stroke [facility])     Past Surgical History:  Procedure Laterality Date  . DILATION AND CURETTAGE OF UTERUS      There were no vitals filed for this visit.  Subjective Assessment - 04/27/19 1419    Subjective   I can tell my arm is looser and I can reach over my head better    Pertinent History  see epic snapshot; pt with [facility] 2 weeks after deliverying her 3rd child    Patient Stated Goals  strength of arm and get my R leg working better    Currently in Pain?  No/denies       Patient seen for aquatic therapy today.  Treatment took place in water 2.5-4 feet deep depending upon activity.  Pt entered the pool via steps with 1 hand railing and mod I. Pt demonstrated excellent postural alignment when entering the pool. Addressed R ankle ROM using aqua stretch techniques as well as active weight shifting with R ankle held in alignment.  Pt continues to gain in ROM for R ankle.  Addressed functional mobility in open water with light UE support using small dumb bells. Pt performing lateral weight shift onto RLE with  functional ambulation vs anterior lateral. Pt often overshoots when attempting to fully transition onto RLE during mobility - force of water movement provides immediate feedback regarding pt's COG over BOS.  Isolated pt 's ability to transition from LLE to RLE with 1 UE support on pool wall then transitioned to completing this without UE support which provided feedback to pt on how far anterior to weight shift as well as to decrease R lateral weight shift. With practice pt able to complete with supervision and cues.  Progressed to incorporating this into open water functional ambulation with no UE support with min facilitation and min cues. Transitioned pt to supine with flotation devices and utilized aquatic stretch techniques and [place] techniques as well as scapular mobility and body on arm to improve overhead reach in supine.  Addressed ab/adduction using UE's to propel in water as well as overhead reach (one arm at a time) to propel in the water. Pt demonstrating improved ROM however RUE fatigues quickly with sustained activity.  Addressed core strength using [place]azz approaches to maintain alignment.   Transitioned back into standing and pt exited water via steps using RLE as power leg to ascend and cues for postural alignment and  control.                         OT Short Term Goals - 04/27/19 1419      OT SHORT TERM GOAL #1   Title  Pt will demonstrate improved overhead reach to at least 130* of shoulder flexion RUE  to assist with heavier overhead home mgmt tasks -7/8//2020 (goal date adjusted to meet 4 visits)    Status  Achieved      OT SHORT TERM GOAL #2   Title  Pt will demonstrate improved RUE strength /core strength to assist with lifting heavy items in grocery store with greater ease (per pt report)    Status  On-going        OT Long Term Goals - 04/27/19 1420      OT LONG TERM GOAL #1   Title  Pt will be mod I with upgraded HEP to include aquatic based  HEP - 05/18/2019 (goal date adjusted to meet 8 visits)    Status  On-going      OT LONG TERM GOAL #2   Title  Pt will demonstrate improved postural alignment and control to reduce falls (has been falling at least 1x/month)    Status  On-going            Plan - 04/27/19 1420    Clinical Impression Statement  Pt continues to progress toward goals .Pt reports she has greater ease in reaching overhead with RUE during functional tasks    OT Occupational Profile and History  Problem Focused Assessment - Including review of records relating to presenting problem    Occupational performance deficits (Please refer to evaluation for details):  IADL's;Leisure;Social Participation    Body Structure / Function / Physical Skills  Balance;Mobility;UE functional use;Strength;IADL;Tone;ROM;GMC    Rehab Potential  Good    Clinical Decision Making  Limited treatment options, no task modification necessary    Comorbidities Affecting Occupational Performance:  May have comorbidities impacting occupational performance    Modification or Assistance to Complete Evaluation   No modification of tasks or assist necessary to complete eval    OT Treatment/Interventions  Aquatic Therapy;Self-care/ADL training;Neuromuscular education;Therapeutic exercise;Functional Mobility Training;Balance training    Plan  aquatic therapy focused on NMR for RUE functional use, postural alignment and control, RLE strengtening and balance.     Consulted and Agree with Plan of Care  Patient       Patient will benefit from skilled therapeutic intervention in order to improve the following deficits and impairments:   Body Structure / Function / Physical Skills: Balance, Mobility, UE functional use, Strength, IADL, Tone, ROM, GMC       Visit Diagnosis: 1. Spastic hemiplegia of right dominant side as late effect of other cerebrovascular disease ([place])   2. Unsteadiness on feet   3. Abnormal posture   4. Stiffness of right shoulder,  not elsewhere classified       Problem List Patient Active Problem List   Diagnosis Date Noted  . Depression 03/23/2017  . Anxiety 03/23/2017  . Muscle weakness 02/03/2017  . History of hemorrhagic stroke with residual hemiparesis ([place]) 12/10/2016  . Spastic hemiplegia and hemiparesis affecting dominant side ([place]) 09/13/2015  . Adhesive capsulitis of right shoulder 08/24/2015  . Localization-related symptomatic epilepsy and epileptic syndromes with simple partial seizures, not intractable, without status epilepticus ([place]) 08/21/2015  . Nontraumatic cortical hemorrhage of cerebral hemisphere ([place])   . Seizure ([place])   . Seizures (  [facility])   . Adjustment disorder with depressed mood   . Contracture of muscle ankle and foot 06/11/2015  . Hemiplga fol ntrm intcrbl hemor aff right dominant side ([place]) 06/06/2015  . Left-sided intracerebral hemorrhage ([place]) 06/04/2015  . Seizure disorder as sequela of cerebrovascular accident ([place])   . Seizure disorder ([place]) 06/03/2015  . Sepsis ([place]) 06/03/2015  . UTI (urinary tract infection) 06/03/2015  . Hypotension 06/02/2015  . Right spastic hemiparesis ([place]) 05/28/2015  . Aphasia following nontraumatic intracerebral hemorrhage 05/28/2015  . History of anxiety disorder 05/28/2015  . ICH (intracerebral hemorrhage) ([place])   . Seizure disorder, nonconvulsive, with status epilepticus ([place])   . Cerebral venous thrombosis of cortical vein   . Cytotoxic cerebral edema ([place])   . IVH (intraventricular hemorrhage) ([place])   . Term pregnancy 05/07/2015  . Spontaneous vaginal delivery 05/07/2015    Quay Burow, OTR/L 04/27/2019, 2:28 PM  [facility] [address] [place], [place], [address] Phone: [phone]   Fax:  [phone]  Name: Jennessa Trigo MRN: [id number] Date of Birth: Nov 24, 1975

## 2019-05-04 ENCOUNTER — Ambulatory Visit: Payer: Medicare Other | Admitting: Occupational Therapy

## 2019-05-04 ENCOUNTER — Other Ambulatory Visit: Payer: Self-pay

## 2019-05-04 ENCOUNTER — Encounter: Payer: Self-pay | Admitting: Neurology

## 2019-05-04 ENCOUNTER — Ambulatory Visit (INDEPENDENT_AMBULATORY_CARE_PROVIDER_SITE_OTHER): Payer: Medicare Other | Admitting: Neurology

## 2019-05-04 ENCOUNTER — Encounter: Payer: Self-pay | Admitting: Occupational Therapy

## 2019-05-04 DIAGNOSIS — G40109 Localization-related (focal) (partial) symptomatic epilepsy and epileptic syndromes with simple partial seizures, not intractable, without status epilepticus: Secondary | ICD-10-CM | POA: Diagnosis not present

## 2019-05-04 DIAGNOSIS — M25611 Stiffness of right shoulder, not elsewhere classified: Secondary | ICD-10-CM | POA: Diagnosis not present

## 2019-05-04 DIAGNOSIS — M25671 Stiffness of right ankle, not elsewhere classified: Secondary | ICD-10-CM | POA: Diagnosis not present

## 2019-05-04 DIAGNOSIS — R2681 Unsteadiness on feet: Secondary | ICD-10-CM | POA: Diagnosis not present

## 2019-05-04 DIAGNOSIS — R296 Repeated falls: Secondary | ICD-10-CM | POA: Diagnosis not present

## 2019-05-04 DIAGNOSIS — I69851 Hemiplegia and hemiparesis following other cerebrovascular disease affecting right dominant side: Secondary | ICD-10-CM

## 2019-05-04 DIAGNOSIS — R293 Abnormal posture: Secondary | ICD-10-CM

## 2019-05-04 DIAGNOSIS — M6281 Muscle weakness (generalized): Secondary | ICD-10-CM | POA: Diagnosis not present

## 2019-05-04 MED ORDER — LEVETIRACETAM 750 MG PO TABS: 1500.0000 mg | ORAL_TABLET | Freq: Two times a day (BID) | ORAL | 3 refills | Status: DC

## 2019-05-04 MED ORDER — LAMOTRIGINE 200 MG PO TABS: 200.0000 mg | ORAL_TABLET | Freq: Two times a day (BID) | ORAL | 3 refills | Status: DC

## 2019-05-04 MED ORDER — LORAZEPAM 1 MG PO TABS: ORAL_TABLET | ORAL | 5 refills | Status: DC

## 2019-05-04 NOTE — Patient Instructions (Signed)
1. Continue Keppra 1500mg  twice a day and Lamictal 200mg  twice a day 2. Follow-up in 3 months, call for any changes  Seizure Precautions: 1. If medication has been prescribed for you to prevent seizures, take it exactly as directed.  Do not stop taking the medicine without talking to your doctor first, even if you have not had a seizure in a long time.   2. Avoid activities in which a seizure would cause danger to yourself or to others.  Don't operate dangerous machinery, swim alone, or climb in high or dangerous places, such as on ladders, roofs, or girders.  Do not drive unless your doctor says you may.  3. If you have any warning that you may have a seizure, lay down in a safe place where you can't hurt yourself.    4.  No driving for 6 months from last seizure, as per [facility].   Please refer to the following link on the [place] website for more information: http://www.epilepsyfoundation.org/answerplace/Social/driving/drivingu.cfm   5.  Maintain good sleep hygiene. Avoid alcohol.  6.  Notify your neurology if you are planning pregnancy or if you become pregnant.  7.  Contact your doctor if you have any problems that may be related to the medicine you are taking.  8.  Call 911 and bring the patient back to the ED if:        A.  The seizure lasts longer than 5 minutes.       B.  The patient doesn't awaken shortly after the seizure  C.  The patient has new problems such as difficulty seeing, speaking or moving  D.  The patient was injured during the seizure  E.  The patient has a temperature over 102 F (39C)  F.  The patient vomited and now is having trouble breathing

## 2019-05-04 NOTE — Progress Notes (Signed)
NEUROLOGY FOLLOW UP OFFICE NOTE  Aniyla Harling [id number] 1976-08-25  HISTORY OF PRESENT ILLNESS: I had the pleasure of seeing Akemi Overholser in follow-up in the neurology clinic on 05/04/2019. She was last seen 6 months ago for seizures. Her husband accompanies her today to provide additional information. Since her last visit, she had contacted our office on 1/27 to report that she was having a couple of spells where she did not feel right with tingling in arm and leg shaking. Lamotrigine dose increased to 200mg  BID. She had been taking 100mg  tabs 2 tabs BID, and when due for refills last 6/17, a prescription was sent for Lamotrigine 200mg  1 tab BID. She got the new prescription the first week of July, then a week later on 7/10 she started having right-sided jerking despite taking 3 doses of Ativan. She continued to have right-sided jerking over the weekend and on 7/13 was found to have been taking Lamotrigine 200mg  2 tabs BID (400mg  BID) instead of 200mg  BID. She was instructed to go back down on dose, and a day later the seizures stopped. She has been fine for the past week or so with no further jerking. She does not remember anything of those 3 days, but her husband states that she was awake and alert the entire time, no staring/unresponsiveness, no loss of consciousness. She denies any headaches, dizziness, diplopia. She has been more emotional since then, her husband reports she is depressed due to her doing so good and then back to square one again. There is also stress with her ex-husband. She has had a few falls tripping on her right foot, last fall was a week ago.   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 She had focal seizures in 2016 with EEG showing left PLEDs and has been on Keppra 1500mg  BID since then with no further focal motor  seizures, however she would have occasional episodes around once a month where she could not get her words out, lasting 3-4 minutes. Sometimes she would get hot and have to sit but her speech would not be affected. On 11/27/16, she started having the same speech difficulties where she was making paraphasic errors, but it continued for 30 minutes and they decided to go to the ER. In the ER, she was noted to be aphasic, then as she was having a head CT, she suddenly had right gaze and head deviation, right arm and leg flexion and shaking for around 3 minutes. She was given IV Ativan but her right leg continued to shake in a rhythmic fashion. She was given additional Keppra and started on Vimpat with no further seizures, back to baseline in a couple of hours. She initially had some difficulties with the Vimpat 100mg  BID where she had hallucinations of hearing music or voices, but these have resolved over the past week. She denies any side effects on Keppra 1500mg  BID. She denies any dizziness, diplopia, gait instability. She has a right foot drop, denies any numbness/tingling on the right side but states it feels different that the other side. She denies any olfactory/gustatory hallucinations, myoclonic jerks, episodes of staring/unresponsiveness, gaps in time. She has not had any further episodes of paraphasic errors since her hospitalization. She had a mild diffuse headache for a couple of days but this has resolved. She denies any dizziness, dysarthria/dysphagia, neck/back  pain, bowel/bladder dysfunction.  Records from her hospitalization in August 2016 were reviewed. She has been seeing neurologist Dr. Leonie Man for post-stroke and seizure care since then, last visit was in August 2017. She had a severe headache for several days in August 2016 2 weeks post-partum. She then developed right-sided weakness, aphasia, then a focal seizure in the hospital. She was found to have a large left parietal ICH with IVH, cerebral  edema, obstructive hydrocephalus and subarachnoid hemorrhage. MRA and MRV were unremarkable. Concern was for cerebral cortical vein thrombosis not seen on MRV, however RCVS was also a consideration. She was treated with hypertonic saline and stabilized. She had a seizure with EEG showing left PLEDs. She was started on Keppra. Hypercoagulable labs were normal. She was discharged to rehab and had another seizure on 06/01/16 with report of eyes rolling back and upper extremities shaking. Keppra dose was increased. She had a repeat MRI brain in September 2016 with resolving subacute left parietal hematoma. There was acute gyral swelling and T2 hyperintensity in the left hippocampal formation, left insula, and operculum, that resolved with repeat MRI 09/2015 showing resolution of these changes. She was initially having some headaches and low dose Topamax was briefly added, then with resolution of headaches, this was tapered off. She had been dealing with post-stroke depression and symptoms had improved, however with limitations on driving again, she is starting to feel depressed again. She has three young children, the youngest is 17 months old. She was not happy with neuro rehab and has a private physical therapist coming weekly where she pushes herself and has made a ton of improvement. She does not like to wear her AFO and her husband did not notice any difference in gait with the AFO.   Epilepsy Risk Factors:  History of large left parietal hematoma with encephalomalacia. Otherwise she had a normal birth and early development.  There is no history of febrile convulsions, CNS infections such as meningitis/encephalitis, significant traumatic brain injury, neurosurgical procedures, or family history of seizures.  Diagnostic Data: I personally reviewed MRI brain 11/26/2016 which did not show any acute changes. There was left paramedian parietal encephalomalacia with extensive hemosiderin staining extending into the  splenium of the corpus callosum, ex vacuo dilatation of the left lateral ventricle occipital horn, increased side of the temporal horn of the left lateral ventricle which may be due to decreased volume of the hippocampus. It is smaller in size with increased signal compared to the right. EEG 11/27/16 showed mild left hemispheric slowing EEG 05/22/15: left hemisphere slowing, rare epileptiform discharges over the left anterior temporal region EEG 05/21/15: PLEDs on the left hemisphere, mainly involving the temporo-parietal region  Prior AEDs: Topamax, Vimpat, oxcarbazepine  PAST MEDICAL HISTORY: Past Medical History:  Diagnosis Date  . Anxiety   . Depression   . ICH (intracerebral hemorrhage) ([place])   . Seizures ([place])   . Stroke [facility])     MEDICATIONS: Current Outpatient Medications on File Prior to Visit  Medication Sig Dispense Refill  . aspirin 81 MG chewable tablet Chew 81 mg by mouth daily.    . Cholecalciferol (VITAMIN D) 2000 units CAPS Take 2,000 Units by mouth daily.    Marland Kitchen FLUoxetine (PROZAC) 20 MG capsule Take 1 capsule (20 mg total) by mouth daily. Take with 40 mg for total of 60 mg daily 90 capsule 1  . FLUoxetine (PROZAC) 40 MG capsule Take 1 capsule (40 mg total) by mouth daily. 90 capsule 0  . lamoTRIgine (LAMICTAL) 200  MG tablet Take 1 tablet (200 mg total) by mouth 2 (two) times daily. Take 200 mg BID  180 tablet 3  . levETIRAcetam (KEPPRA) 750 MG tablet Take 2 tablets (1,500 mg total) by mouth 2 (two) times daily. 360 tablet 3  . LORazepam (ATIVAN) 1 MG tablet Take 1 tablet as needed for seizures (Patient taking differently: Take 1-3 mg by mouth daily as needed for seizure. ) 10 tablet 4  . magnesium oxide (MAG-OX) 400 MG tablet Take 400 mg by mouth 2 (two) times daily.    . mirtazapine (REMERON) 30 MG tablet Take 1 tablet (30 mg total) by mouth at bedtime. 90 tablet 0  . Multiple Vitamin (MULTIVITAMIN) tablet Take 1 tablet by mouth daily.     Marland Kitchen Plecanatide (TRULANCE) 3 MG  TABS Take 3 mg by mouth daily.      No current facility-administered medications on file prior to visit.     ALLERGIES: No Known Allergies  FAMILY HISTORY: Family History  Problem Relation Age of Onset  . Cancer Mother   . Cancer Father        pancreatic    SOCIAL HISTORY: Social History   Socioeconomic History  . Marital status: Married    Spouse name: Not on file  . Number of children: Not on file  . Years of education: Not on file  . Highest education level: Not on file  Occupational History  . Not on file  Social Needs  . Financial resource strain: Not on file  . Food insecurity    Worry: Not on file    Inability: Not on file  . Transportation needs    Medical: Not on file    Non-medical: Not on file  Tobacco Use  . Smoking status: Never Smoker  . Smokeless tobacco: Never Used  Substance and Sexual Activity  . Alcohol use: Yes    Alcohol/week: 1.0 standard drinks    Types: 1 Glasses of wine per week    Comment: casual   . Drug use: No  . Sexual activity: Yes    Birth control/protection: None    Comment: husband has vasectomy  Lifestyle  . Physical activity    Days per week: Not on file    Minutes per session: Not on file  . Stress: Not on file  Relationships  . Social Herbalist on phone: Not on file    Gets together: Not on file    Attends religious service: Not on file    Active member of club or organization: Not on file    Attends meetings of clubs or organizations: Not on file    Relationship status: Not on file  . Intimate partner violence    Fear of current or ex partner: Not on file    Emotionally abused: Not on file    Physically abused: Not on file    Forced sexual activity: Not on file  Other Topics Concern  . Not on file  Social History Narrative   Lives with husband and three kids in two story home      Right handed      Highest level of edu- bachelors    REVIEW OF SYSTEMS: Constitutional: No fevers, chills, or  sweats, no generalized fatigue, change in appetite Eyes: No visual changes, double vision, eye pain Ear, nose and throat: No hearing loss, ear pain, nasal congestion, sore throat Cardiovascular: No chest pain, palpitations Respiratory:  No shortness of breath at rest or with  exertion, wheezes GastrointestinaI: No nausea, vomiting, diarrhea, abdominal pain, fecal incontinence Genitourinary:  No dysuria, urinary retention or frequency Musculoskeletal:  No neck pain, +back pain Integumentary: No rash, pruritus, skin lesions Neurological: as above Psychiatric: + depression, no insomnia,+ anxiety Endocrine: No palpitations, fatigue, diaphoresis, mood swings, change in appetite, change in weight, increased thirst Hematologic/Lymphatic:  No anemia, purpura, petechiae. Allergic/Immunologic: no itchy/runny eyes, nasal congestion, recent allergic reactions, rashes  PHYSICAL EXAM: Vitals:   05/04/19 1141  BP: 99/62  Pulse: 72  SpO2: 96%   General: No acute distress, tearful Head:  Normocephalic/atraumatic Skin/Extremities: No rash, no edema, wearing right AFO Neurological Exam: alert and oriented to person, place, and time. No aphasia or dysarthria. Fund of knowledge is appropriate.  Recent and remote memory are intact.  Attention and concentration are normal.    Able to name objects and repeat phrases. are normal.    Able to name objects and repeat phrases. Cranial nerves: CN I: not tested CN II: pupils equal, round and reactive to light, visual fields intact CN III, IV, VI:  full range of motion, no nystagmus CN VII: upper and lower face symmetric CN VIII: hearing intact to conversation Bulk & Tone: increased tone on right LE (more on right ankle), no fasciculations. Motor: 0/5 right foot dorsiflexion, eversion/inversion, 2/5 plantarflexion. Otherwise 5/5 throughout with no pronator drift (similar to prior) Cerebellar: no incoordination on finger to nose testing Gait: spastic hemiparetic  gait with foot circumduction (unchanged) Tremor: none  IMPRESSION: This is a pleasant 43 yo RH woman with a history of large left ICH and focal seizures in August 2016 with residual right foot drop. Her last GTC was in 06/2018. She was having focal motor seizures in January 2020 and Lamotrigine was increased to 200mg  BID. She was doing very well until 7/10 when she started having a significant increase in focal motor seizures affecting her right side. No loss of awareness. She was found to have accidentally been taking Lamotrigine 400mg  BID instead of 200mg  BID (had pill change but was still doing instructions from prior Rx). She is now back on Lamotrigine 200mg  BID and Levetricetam 1500mg  BID with no further seizures since 7/14. We discussed that the increase in seizures appears to have due to potential toxicity from significant increase in Lamotrigine dose, and appears to be a provoked episode. We discussed [place] driving laws, she has not had any loss of consciousness/awareness, in addition the recent bout appears provoked, advised to wait another month to ensure no further seizures then can resume driving. She will follow-up in 3-4 months and knows to call for any changes.  Thank you for allowing me to participate in her care.  Please do not hesitate to call for any questions or concerns.    Ellouise Newer, M.D.   CC: Dr. Brigitte Pulse

## 2019-05-04 NOTE — Therapy (Signed)
[facility] [address] [place], [place], [address] Phone: [phone]   Fax:  [phone]  Occupational Therapy Treatment  Patient Details  Name: Ashley [name] MRN: [id number] Date of Birth: 1975/12/23 Referring Provider (OT): Dr Marton Redwood   Encounter Date: 05/04/2019  OT End of Session - 05/04/19 1519    Visit Number  7    Number of Visits  8    Date for OT Re-Evaluation  05/18/19    Authorization Type  medicare with [employer] - will need PN every 10th visit    Authorization - Visit Number  7    Authorization - Number of Visits  10    OT Start Time  4536    OT Stop Time  1348    OT Time Calculation (min)  51 min    Activity Tolerance  Patient tolerated treatment well       Past Medical History:  Diagnosis Date  . Anxiety   . Depression   . ICH (intracerebral hemorrhage) ([place])   . Seizures ([place])   . Stroke [facility])     Past Surgical History:  Procedure Laterality Date  . DILATION AND CURETTAGE OF UTERUS      There were no vitals filed for this visit.  Subjective Assessment - 05/04/19 1516    Subjective   I could really tell I worked my arm after last session.    Pertinent History  see epic snapshot; pt with [facility] 2 weeks after deliverying her 3rd child    Patient Stated Goals  strength of arm and get my R leg working better    Currently in Pain?  No/denies          Patient seen for aquatic therapy today.  Treatment took place in water 2.5-4 feet deep depending upon activity.  Pt entered the pool via steps using 2 hand rails and only min vc's for postural alignment. Aqua stretch techniques as well as active weight bearing with R ankle held in alignment to address R ankle ROM.  Feel pt could benefit from in clinic land PT to further address RLE ROM, spasticity and to review/address current AFO as needed. Pt in agreement and pt has obtained referral from MD.  Addressed functional ambulation in open water using  light dumb bells for BUE support and min facilitation. Pt needs cues to decrease wide BOS as well as to focus on anterior lateral weight shift (vs just lateral) with functional ambulation.  Addressed weight shifting in tandem with emphasis on full weight shift onto RLE with single UE support - also addressed backward walking with single UE support to focus on active hip extension with erect trunk as well as dynamic standing balance and weight shifting.  Pt requires min facilitation and mod cues as well as significant repetition due to poor sensation and apraxia.  Transitioned into supine first with flotation devices and then slowly removing to increase demand on core control for floating due to buoyancy.  For first time pt able to maintain control in supine without flotation devices.  Also addressed overhead reach in supine by first using aqua stretch techniques in supine for R shoulder girdle mobilization as well as stretch to allow for improved overhead ROM for RUE in supine then progressing to pt using BUE's (alternating) to pull body full length of pool first with flotation devices and then without to significantly increase demand on trunk.  Transitioned back into standing and addressed dynamic standing balance and increased activity  and control of RLE - water provides immediate feedback due to buoyancy.  Pt exited pool via steps using 2 hand rails with distant supervision.                      OT Short Term Goals - 05/04/19 1517      OT SHORT TERM GOAL #1   Title  Pt will demonstrate improved overhead reach to at least 130* of shoulder flexion RUE  to assist with heavier overhead home mgmt tasks -7/8//2020 (goal date adjusted to meet 4 visits)    Status  Achieved      OT SHORT TERM GOAL #2   Title  Pt will demonstrate improved RUE strength /core strength to assist with lifting heavy items in grocery store with greater ease (per pt report)    Status  Achieved        OT Long Term  Goals - 05/04/19 1517      OT LONG TERM GOAL #1   Title  Pt will be mod I with upgraded HEP to include aquatic based HEP - 05/18/2019 (goal date adjusted to meet 8 visits)    Status  On-going      OT LONG TERM GOAL #2   Title  Pt will demonstrate improved postural alignment and control to reduce falls (has been falling at least 1x/month)    Status  On-going            Plan - 05/04/19 1518    Clinical Impression Statement  Pt with slowly improving postural alignment and control as well as improved dynamic standing balance    OT Occupational Profile and History  Problem Focused Assessment - Including review of records relating to presenting problem    Occupational performance deficits (Please refer to evaluation for details):  IADL's;Leisure;Social Participation    Body Structure / Function / Physical Skills  Balance;Mobility;UE functional use;Strength;IADL;Tone;ROM;GMC    Rehab Potential  Good    Clinical Decision Making  Limited treatment options, no task modification necessary    Comorbidities Affecting Occupational Performance:  May have comorbidities impacting occupational performance    Modification or Assistance to Complete Evaluation   No modification of tasks or assist necessary to complete eval    OT Frequency  1x / week    OT Duration  8 weeks    OT Treatment/Interventions  Aquatic Therapy;Self-care/ADL training;Neuromuscular education;Therapeutic exercise;Functional Mobility Training;Balance training    Plan  aquatic therapy focused on NMR for RUE functional use, postural alignment and control, RLE strengtening and balance.     Consulted and Agree with Plan of Care  Patient       Patient will benefit from skilled therapeutic intervention in order to improve the following deficits and impairments:   Body Structure / Function / Physical Skills: Balance, Mobility, UE functional use, Strength, IADL, Tone, ROM, GMC       Visit Diagnosis: 1. Spastic hemiplegia of right  dominant side as late effect of other cerebrovascular disease ([place])   2. Unsteadiness on feet   3. Abnormal posture   4. Stiffness of right shoulder, not elsewhere classified       Problem List Patient Active Problem List   Diagnosis Date Noted  . Depression 03/23/2017  . Anxiety 03/23/2017  . Muscle weakness 02/03/2017  . History of hemorrhagic stroke with residual hemiparesis ([place]) 12/10/2016  . Spastic hemiplegia and hemiparesis affecting dominant side ([place]) 09/13/2015  . Adhesive capsulitis of right shoulder 08/24/2015  . Localization-related symptomatic  epilepsy and epileptic syndromes with simple partial seizures, not intractable, without status epilepticus ([place]) 08/21/2015  . Nontraumatic cortical hemorrhage of cerebral hemisphere ([place])   . Seizure ([place])   . Seizures ([place])   . Adjustment disorder with depressed mood   . Contracture of muscle ankle and foot 06/11/2015  . Hemiplga fol ntrm intcrbl hemor aff right dominant side ([place]) 06/06/2015  . Left-sided intracerebral hemorrhage ([place]) 06/04/2015  . Seizure disorder as sequela of cerebrovascular accident ([place])   . Seizure disorder ([place]) 06/03/2015  . Sepsis ([place]) 06/03/2015  . UTI (urinary tract infection) 06/03/2015  . Hypotension 06/02/2015  . Right spastic hemiparesis ([place]) 05/28/2015  . Aphasia following nontraumatic intracerebral hemorrhage 05/28/2015  . History of anxiety disorder 05/28/2015  . ICH (intracerebral hemorrhage) ([place])   . Seizure disorder, nonconvulsive, with status epilepticus ([place])   . Cerebral venous thrombosis of cortical vein   . Cytotoxic cerebral edema ([place])   . IVH (intraventricular hemorrhage) ([place])   . Term pregnancy 05/07/2015  . Spontaneous vaginal delivery 05/07/2015    Quay Burow, OTR/L 05/04/2019, 3:22 PM  [facility] [address] [place], [place], [address] Phone: [phone]   Fax:  [phone]  Name:  Latima Hamza MRN: [id number] Date of Birth: November 01, 1975

## 2019-05-09 ENCOUNTER — Encounter: Payer: Self-pay | Admitting: Physical Therapy

## 2019-05-09 ENCOUNTER — Ambulatory Visit: Payer: Medicare Other | Admitting: Physical Therapy

## 2019-05-09 ENCOUNTER — Other Ambulatory Visit: Payer: Self-pay

## 2019-05-09 DIAGNOSIS — M25671 Stiffness of right ankle, not elsewhere classified: Secondary | ICD-10-CM | POA: Diagnosis not present

## 2019-05-09 DIAGNOSIS — M25611 Stiffness of right shoulder, not elsewhere classified: Secondary | ICD-10-CM | POA: Diagnosis not present

## 2019-05-09 DIAGNOSIS — R296 Repeated falls: Secondary | ICD-10-CM

## 2019-05-09 DIAGNOSIS — R293 Abnormal posture: Secondary | ICD-10-CM | POA: Diagnosis not present

## 2019-05-09 DIAGNOSIS — M6281 Muscle weakness (generalized): Secondary | ICD-10-CM | POA: Diagnosis not present

## 2019-05-09 DIAGNOSIS — I69851 Hemiplegia and hemiparesis following other cerebrovascular disease affecting right dominant side: Secondary | ICD-10-CM | POA: Diagnosis not present

## 2019-05-09 DIAGNOSIS — R2681 Unsteadiness on feet: Secondary | ICD-10-CM

## 2019-05-09 NOTE — Therapy (Signed)
[facility] [address] [place], [place], [address] Phone: [phone]   Fax:  [phone]  Physical Therapy Evaluation  Patient Details  Name: Ashley [name] MRN: [id number] Date of Birth: 02-27-1976 Referring Provider (PT): Marton Redwood, MD   Encounter Date: 05/09/2019   CLINIC OPERATION CHANGES: Outpatient Neuro Rehab is open at lower capacity following universal masking, social distancing, and patient screening.  The patient's COVID risk of complications score is 0.   PT End of Session - 05/10/19 0951    Visit Number  1    Number of Visits  17    Date for PT Re-Evaluation  07/09/19    Authorization Type  Medicare and BCBS - 10th visit PN required.  30 VL for OT; checking on PT    PT Start Time  1300    PT Stop Time  1345    PT Time Calculation (min)  45 min    Activity Tolerance  Patient tolerated treatment well    Behavior During Therapy  WFL for tasks assessed/performed       Past Medical History:  Diagnosis Date  . Anxiety   . Depression   . ICH (intracerebral hemorrhage) ([place])   . Seizures ([place])   . Stroke [facility])     Past Surgical History:  Procedure Laterality Date  . DILATION AND CURETTAGE OF UTERUS      There were no vitals filed for this visit.   Subjective Assessment - 05/09/19 1308    Subjective  Pt returns to PT for further assessment and mangement of RLE ROM, spasticity and gait.  Pt has been participating in aquatic therapy.  Pt had a seizure about 2 weeks ago but it was due to an accidental overdose on seizure medication.    Pertinent History  : L CVA with ICH, R hemiplegia, contracture of R ankle muscles, R shoulder adhesive capsulitis, multiple falls with injury, seizures, depression and anxiety    Limitations  Standing;Walking    Patient Stated Goals  More stability, prevent falls, would like to be able to run    Currently in Pain?  No/denies         [facility] PT Assessment -  05/09/19 1311      Assessment   Medical Diagnosis  late effects of L CVA - R spasticity and gait abnormalities    Referring Provider (PT)  Marton Redwood, MD    Onset Date/Surgical Date  04/28/19   referral date   Hand Dominance  Right    Prior Therapy  pt received PT, OT and ST inpt rehab and outpatient       Precautions   Precautions  Fall    Precaution Comments  L CVA with ICH, R hemiplegia, contracture of R ankle muscles, R shoulder adhesive capsulitis, multiple falls with injury, seizures, depression and anxiety      Balance Screen   Has the patient fallen in the past 6 months  Yes    How many times?  10   tries to do too much, too fast.  Fx patella and shoulder     Home Environment   Living Environment  Private residence    Living Arrangements  Spouse/significant other;Children    Available Help at Discharge  Family    Type of [place] to enter    Entrance Stairs-Number of Steps  3    [place]  Two  level    Alternate Level Stairs-Number of Steps  one flight    Alternate Level Stairs-Rails  Left      Prior Function   Level of Independence  Independent    Vocation  On disability      Observation/Other Assessments   Focus on Therapeutic Outcomes (FOTO)   N/A      Sensation   Light Touch  Impaired by gross assessment    Proprioception  Impaired by gross assessment    Additional Comments  hypersensitive to light touch and temperature in RLE      Coordination   Gross Motor Movements are Fluid and Coordinated  No    Heel Shin Test  mildly dysmetric RLE      Tone   Assessment Location  Right Lower Extremity      ROM / Strength   AROM / PROM / Strength  PROM;Strength      PROM   Overall PROM   Deficits    Overall PROM Comments  R ankle rests in supination and inversion 10 degrees.  Can manually move into eversion 5 deg but unable to reach neutral.  Ankle DF: lacking 45 deg to neutral ankle DF      Strength    Overall Strength  Deficits    Overall Strength Comments  R hip flexion 3+/5, knee flexion and extension 4/5, 0/5 active ankle DF due to spasticity, 3+/5 hip ABD, 2/5 hip extension      Ambulation/Gait   Ambulation/Gait  Yes    Ambulation/Gait Assistance  6: Modified independent (Device/Increase time);4: Min assist    Ambulation/Gait Assistance Details  MOD I with AFO; when ambulating without shoes on pt required min A for balance/safety when performing crouched gait (knee flexion to promote increased ankle DF during weight shifting and advancement of each LE).    Ambulation Distance (Feet)  115 Feet    Assistive device  None    Gait Pattern  Step-through pattern;Decreased weight shift to right;Right hip hike;Lateral trunk lean to left;Decreased trunk rotation;Trunk rotated posteriorly on right;Wide base of support;Poor foot clearance - right    Ambulation Surface  Level;Indoor      RLE Tone   RLE Tone  Moderate;Modified Ashworth      RLE Tone   Modified Ashworth Scale for Grading Hypertonia RLE  More marked increase in muscle tone through most of the ROM, but affected part(s) easily moved                Objective measurements completed on examination: See above findings.              PT Education - 05/10/19 0950    Education provided  Yes    Education Details  Clinical findings, PT POC and goals    Person(s) Educated  Patient    Methods  Explanation    Comprehension  Verbalized understanding       PT Short Term Goals - 05/10/19 1020      PT SHORT TERM GOAL #1   Title  Patient will demonstrate independence with updated stretching and strengthening HEP    Time  4    Period  Weeks    Status  New    Target Date  06/09/19      PT SHORT TERM GOAL #2   Title  Pt will participate in further assessment of falls risk during gait with gait velocity and FGA    Time  4    Period  Weeks  Status  New    Target Date  06/09/19      PT SHORT TERM GOAL #3   Title   Pt will participate in assessment for new and more appropriate AFO    Time  4    Period  Weeks    Status  New    Target Date  06/09/19      PT SHORT TERM GOAL #4   Title  Pt will tolerate initial set up and fitting of functional electrical stimulation and will begin ROM and gait training with Bioness on RLE (if cleared medically by physician)    Time  4    Period  Weeks    Status  New    Target Date  06/09/19      PT SHORT TERM GOAL #5   Title  Pt will participate in assessment of RLE mechanics when performing exercise on elliptical    Time  4    Period  Weeks    Status  New    Target Date  06/09/19        PT Long Term Goals - 05/10/19 1023      PT LONG TERM GOAL #1   Title  Pt will demonstrate independence with final HEP and improved RLE mechanics on elliptical when using for aerobic exercise    Time  8    Period  Weeks    Status  New    Target Date  07/09/19      PT LONG TERM GOAL #2   Title  Pt will demonstrate 4 point improvement in FGA to indicate decreased falls risk    Baseline  TBD    Time  8    Period  Weeks    Status  New    Target Date  07/09/19      PT LONG TERM GOAL #3   Title  Pt will demonstrate improvement in gait velocity with new AFO to >/= 2.62 ft/sec    Baseline  TBD    Time  8    Period  Weeks    Status  New    Target Date  07/09/19      PT LONG TERM GOAL #4   Title  Pt will demonstrate 5 deg improvement in passive or active R ankle eversion and 20 degree improvement in ankle DF to improve gait mechanics    Baseline  10 deg inversion, lacking 45 deg to neutral DF    Time  8    Period  Weeks    Status  New    Target Date  07/09/19      PT LONG TERM GOAL #5   Title  Pt will demonstrate ability to don new AFO and will tolerate full day of wear    Time  8    Period  Weeks    Status  New    Target Date  07/09/19             Plan - 05/10/19 0955    Clinical Impression Statement  Pt is a 43 year old female referred to Neuro OPPT  for evaluation of gait abnormalities and RLE spasticity as late effects of L CVA.  Pt's PMH is significant for the following: L CVA with ICH, R hemiplegia, contracture of R ankle muscles, R shoulder adhesive capsulitis, multiple falls with injury, seizures, depression and anxiety. The following deficits were noted during pt's exam:  RLE impaired sensation, impaired strength, hypertonicity and increased spasticity with decreased ankle  ROM into dorsiflexion and eversion, impaired balance, and gait which is no longer being compensated for with patient's original AFO.  Patient experiences multiple falls, often with significant injury.  Pt will be receiving an injection to assist with tone management.  Pt would benefit from skilled PT to address these impairments and functional limitations to maximize ROM and functional strength gains following injection, to maximize functional mobility independence and reduce falls risk.    Personal Factors and Comorbidities  Comorbidity 2;Time since onset of injury/illness/exacerbation;Age    Comorbidities  L CVA with ICH, R hemiplegia, contracture of R ankle muscles, R shoulder adhesive capsulitis, multiple falls with injury, seizures, depression and anxiety, pt is a mother to young children    Examination-Activity Limitations  Locomotion Level;Squat;Stairs;Stand;Caring for Others    Examination-Participation Restrictions  Cleaning;Shop;Community Activity    Stability/Clinical Decision Making  Evolving/Moderate complexity    Clinical Decision Making  Moderate    Rehab Potential  Good    Clinical Impairments Affecting Rehab Potential  time since onset, mm contracture in R ankle    PT Frequency  2x / week    PT Duration  8 weeks    PT Treatment/Interventions  ADLs/Self Care Home Management;Electrical Stimulation;Gait training;Stair training;Functional mobility training;Therapeutic activities;Therapeutic exercise;Balance training;Neuromuscular re-education;Patient/family  education;Orthotic Fit/Training;Passive range of motion;Dry needling;Taping    PT Next Visit Plan  Assess FGA and gait velocity.  start intense stretching program for RLE - gastroc, posterior tib, hamstring, hip flexor.  RLE strengthening.  Bioness if cleared by physician.  Watch mechanics on elliptical.  Get order for new AFO and call [facility] to get assessment.  Gait and balance training.    Consulted and Agree with Plan of Care  Patient       Patient will benefit from skilled therapeutic intervention in order to improve the following deficits and impairments:  Abnormal gait, Decreased balance, Decreased mobility, Decreased range of motion, Decreased strength, Difficulty walking, Hypomobility, Increased muscle spasms, Impaired flexibility, Impaired tone, Impaired sensation  Visit Diagnosis: 1. Spastic hemiplegia of right dominant side as late effect of other cerebrovascular disease ([place])   2. Unsteadiness on feet   3. Repeated falls   4. Muscle weakness (generalized)   5. Stiffness of right ankle, not elsewhere classified        Problem List Patient Active Problem List   Diagnosis Date Noted  . Depression 03/23/2017  . Anxiety 03/23/2017  . Muscle weakness 02/03/2017  . History of hemorrhagic stroke with residual hemiparesis ([place]) 12/10/2016  . Spastic hemiplegia and hemiparesis affecting dominant side ([place]) 09/13/2015  . Adhesive capsulitis of right shoulder 08/24/2015  . Localization-related symptomatic epilepsy and epileptic syndromes with simple partial seizures, not intractable, without status epilepticus ([place]) 08/21/2015  . Nontraumatic cortical hemorrhage of cerebral hemisphere ([place])   . Seizure ([place])   . Seizures ([place])   . Adjustment disorder with depressed mood   . Contracture of muscle ankle and foot 06/11/2015  . Hemiplga fol ntrm intcrbl hemor aff right dominant side ([place]) 06/06/2015  . Left-sided intracerebral hemorrhage ([place]) 06/04/2015  . Seizure disorder as sequela  of cerebrovascular accident ([place])   . Seizure disorder ([place]) 06/03/2015  . Sepsis ([place]) 06/03/2015  . UTI (urinary tract infection) 06/03/2015  . Hypotension 06/02/2015  . Right spastic hemiparesis ([place]) 05/28/2015  . Aphasia following nontraumatic intracerebral hemorrhage 05/28/2015  . History of anxiety disorder 05/28/2015  . ICH (intracerebral hemorrhage) ([place])   . Seizure disorder, nonconvulsive, with status epilepticus ([place])   . Cerebral venous  thrombosis of cortical vein   . Cytotoxic cerebral edema ([place])   . IVH (intraventricular hemorrhage) ([place])   . Term pregnancy 05/07/2015  . Spontaneous vaginal delivery 05/07/2015    [name], PT, DPT 05/10/19    10:33 AM    [facility] [address] [place], [place], [address] Phone: [phone]   Fax:  [phone]  Name: Ashley [name] MRN: [id number] Date of Birth: October 30, 1975

## 2019-05-10 ENCOUNTER — Encounter: Payer: Medicare Other | Attending: Physical Medicine & Rehabilitation | Admitting: Physical Medicine & Rehabilitation

## 2019-05-10 ENCOUNTER — Telehealth: Payer: Self-pay | Admitting: Physical Therapy

## 2019-05-10 ENCOUNTER — Encounter: Payer: Self-pay | Admitting: Physical Medicine & Rehabilitation

## 2019-05-10 VITALS — BP 106/61 | HR 76 | Temp 97.7°F | Ht 63.0 in | Wt 126.0 lb

## 2019-05-10 DIAGNOSIS — G8110 Spastic hemiplegia affecting unspecified side: Secondary | ICD-10-CM | POA: Diagnosis not present

## 2019-05-10 NOTE — Telephone Encounter (Signed)
Hello Dr. Brigitte Pulse, Ashley [name] was evaluated by physical therapy yesterday and I believe that her current AFO may be providing too much restriction to her movement.  This level of stability was required in the acute phase of her rehabilitation but is now adding to her limited ankle ROM and unsafe gait mechanics.  I believe she would benefit from a re-assessment by an orthotist and fit with another form of AFO.  If you agree, please submit request in EPIC under MD Order, Other Orders (list new R AFO in comments) or fax to [facility] at [phone].   Thank you, Ashley [name], PT, DPT 05/10/19    10:43 AM    Also, could you please addend or resend your PT eval and treat referral with a different diagnosis?  The diagnosis listed was aphasia.    Thanks!   [facility] [address] [place], [place]  [address] Phone:  [phone] Fax:  [phone]

## 2019-05-10 NOTE — Progress Notes (Signed)
Xeomin Injection for spasticity using needle EMG guidance  Dilution: 50 Units/ml Indication: Severe spasticity which interferes with ADL,mobility and/or  hygiene and is unresponsive to medication management and other conservative care Informed consent was obtained after describing risks and benefits of the procedure with the patient. This includes bleeding, bruising, infection, excessive weakness, or medication side effects. A REMS form is on file and signed. Needle: 25g 2" needle electrode Number of units per muscle Post tib 75U Gastroc 150U FDL 75 All injections were done after obtaining appropriate EMG activity and after negative drawback for blood. The patient tolerated the procedure well. Post procedure instructions were given. A followup appointment was made.

## 2019-05-10 NOTE — Telephone Encounter (Signed)
Dr. Delice Lesch, I am requesting medical clearance to use functional e-stim/Bioness with Ashley [name] in physical therapy for RLE strengthening, ROM, gait training and neuromuscular re-education .  Please let me know if you feel she would be appropriate to use functional electrical stimulation on her R lower extremity due to her recent episode of seizures.  Thank you, [name], PT, DPT 05/10/19    10:47 AM   [name] [address] [place], [place]  [address] Phone:  [phone] Fax:  [phone]

## 2019-05-10 NOTE — Telephone Encounter (Signed)
Hi Audra, I think that should be okay. Thanks

## 2019-05-10 NOTE — Patient Instructions (Signed)
IncobotulinumtoxinA injection What is this medicine? INCOBOTULINUMTOXINA (IN koh BOT ue LYE num TOX in AY) is a neuro-muscular blocker. This medicine is used to treat eyelid, neck muscle, and hand and arm muscle spasms. It is also used to decrease drooling and to treat frown lines on the face. This medicine may be used for other purposes; ask your health care provider or pharmacist if you have questions. COMMON BRAND NAME(S): Xeomin What should I tell my health care provider before I take this medicine? They need to know if you have any of these conditions:  bleeding disorders  cerebral palsy  difficulty swallowing  history of surgery where this medicine is going to be used  infection where this medicine is going to be used  lung or breathing disease, like asthma  myasthenia gravis or other neurologic disease  nerve or muscle disease  surgery plans  an unusual or allergic reaction to botulinum toxin, albumin, sucrose, other medicines, foods, dyes, or preservatives  pregnant or trying to get pregnant  breast-feeding How should I use this medicine? This medicine is for injection into a muscle. It is given by a health care professional in a hospital or clinic setting. Talk to your pediatrician regarding the use of this medicine in children. This medicine is not approved for use in children. A special MedGuide will be given to you before each treatment. Be sure to read this information carefully each time. Overdosage: If you think you have taken too much of this medicine contact a poison control center or emergency room at once. NOTE: This medicine is only for you. Do not share this medicine with others. What if I miss a dose? This does not apply. What may interact with this medicine?  aminoglycoside antibiotics like gentamicin, neomycin, tobramycin  antihistamines for allergy, cough and cold  atropine  certain medicines for bladder problems like oxybutynin,  tolterodine  certain medicines for Parkinson's disease like benztropine, trihexyphenidyl  certain medicines for sleep  certain medicines for stomach problems like dicyclomine, hyoscyamine  certain medicines for travel sickness like scopolamine  certain medicines that treat or prevent blood clots like warfarin, enoxaparin, and dalteparin  ipratropium  muscle relaxants  other botulinum toxin injections This list may not describe all possible interactions. Give your health care provider a list of all the medicines, herbs, non-prescription drugs, or dietary supplements you use. Also tell them if you smoke, drink alcohol, or use illegal drugs. Some items may interact with your medicine. What should I watch for while using this medicine? Visit your doctor for regular check ups. This medicine will cause weakness in the muscle where it is injected. Tell your doctor if you feel unusually weak in other muscles. Get medical help right away if you have problems with breathing, swallowing, or talking. This medicine contains albumin from human blood. It may be possible to pass an infection in this medicine, but no cases have been reported. Talk to your doctor about the risks and benefits of this medicine. If your activities have been limited by your condition, go back to your regular routine slowly after treatment with this medicine. You may get muscle weakness, blurred vision, or drooping eyelids. If this happens, do not drive, use machinery, or do other dangerous activities. What side effects may I notice from receiving this medicine? Side effects that you should report to your doctor or health care professional as soon as possible:  allergic reactions like skin rash, itching or hives, swelling of the face, lips, or tongue  breathing problems  changes in vision  eye irritation  infection  numbness  speech problems  swallowing problems  trouble passing urine or change in the amount of  urine Side effects that usually do not require medical attention (report to your doctor or health care professional if they continue or are bothersome):  bruising or pain at site where injected  drooping eyelid  dry eyes or mouth  headache  muscle aches, pains This list may not describe all possible side effects. Call your doctor for medical advice about side effects. You may report side effects to FDA at 1-800-FDA-1088. Where should I keep my medicine? This drug is given in a hospital or clinic and will not be stored at home. NOTE: This sheet is a summary. It may not cover all possible information. If you have questions about this medicine, talk to your doctor, pharmacist, or health care provider.  2020 Elsevier/Gold Standard (2017-04-20 11:43:52)

## 2019-05-11 ENCOUNTER — Encounter: Payer: Self-pay | Admitting: Occupational Therapy

## 2019-05-11 ENCOUNTER — Other Ambulatory Visit: Payer: Self-pay

## 2019-05-11 ENCOUNTER — Ambulatory Visit: Payer: Medicare Other | Admitting: Occupational Therapy

## 2019-05-11 DIAGNOSIS — M6281 Muscle weakness (generalized): Secondary | ICD-10-CM

## 2019-05-11 DIAGNOSIS — R296 Repeated falls: Secondary | ICD-10-CM | POA: Diagnosis not present

## 2019-05-11 DIAGNOSIS — M25611 Stiffness of right shoulder, not elsewhere classified: Secondary | ICD-10-CM | POA: Diagnosis not present

## 2019-05-11 DIAGNOSIS — I69851 Hemiplegia and hemiparesis following other cerebrovascular disease affecting right dominant side: Secondary | ICD-10-CM

## 2019-05-11 DIAGNOSIS — R293 Abnormal posture: Secondary | ICD-10-CM

## 2019-05-11 DIAGNOSIS — M25671 Stiffness of right ankle, not elsewhere classified: Secondary | ICD-10-CM | POA: Diagnosis not present

## 2019-05-11 DIAGNOSIS — R2681 Unsteadiness on feet: Secondary | ICD-10-CM | POA: Diagnosis not present

## 2019-05-11 NOTE — Therapy (Addendum)
[facility] [address] [place], [place], [address] Phone: [phone]   Fax:  [phone]  Occupational Therapy Treatment  Patient Details  Name: Ashley [name] MRN: [id number] Date of Birth: 12-30-1975 Referring Provider (OT): Dr Marton Redwood   Encounter Date: 05/11/2019  OT End of Session - 05/11/19 1600    Visit Number  8    Number of Visits  16    Date for OT Re-Evaluation  07/20/19   renewed and date adjusted due to hold first two weeks in August due to scheduling   Authorization Type  medicare with BCBS - will need PN every 10th visit    Authorization Time Period  30 visit COMBINED between PT/OT;  pt has utilized benefit and is now self pay.     Authorization - Visit Number  8    Authorization - Number of Visits  10    OT Start Time  1101    OT Stop Time  1149    OT Time Calculation (min)  48 min    Activity Tolerance  Patient tolerated treatment well       Past Medical History:  Diagnosis Date  . Anxiety   . Depression   . ICH (intracerebral hemorrhage) ([place])   . Seizures ([place])   . Stroke [facility])     Past Surgical History:  Procedure Laterality Date  . DILATION AND CURETTAGE OF UTERUS      There were no vitals filed for this visit.  Subjective Assessment - 05/11/19 1558    Subjective   I love this water therapy    Pertinent History  see epic snapshot; pt with [facility] 2 weeks after deliverying her 3rd child    Patient Stated Goals  strength of arm and get my R leg working better    Currently in Pain?  No/denies           Patient seen for aquatic therapy today.  Treatment took place in water 2.5-4 feet deep depending upon activity.  Pt entered the pool via steps using 1 hand railing and mod I.  Pt required no cues or postural alignment when descending steps. Continue to address R ankle alignment and ROM using aqua stretch techniques as well as using weight shifting with weight bearing using R ankle  air cast to assist with alignment.  Addressed functional ambulation and dynamic standing balance initially via length of pool x4 and with increased turbulence added to further challenge dynamic standing balance - pt with some LOB but able to recover without physical assistance (pt used light dumb bells for UE support).  Addressed transitioning from RLE onto LLE with emphasis on toe off from RLE in order to facilitate R hip extension with transition with single LUE support on pool wall.  Pt required significant facilitation and repetition initially due to apraxia as well as significant sensory impairment however with practice and repetition demonstrated improvement. Then incorporated movement pattern into functional ambulation in open water with light UE support with improved postural alignment (mod cues and min facilitation).  Pt reports she underwent injection yesterday for RLE spasticity and is currently seeing PT in the clinic for further assessment for altered bracing and possible Bioness.  Transitioned pt to supine using only hip belt for flotation and pt able to control core alignment in this position for all activities without assistance.  In supine addressed RUE shoulder girdle mobilization using aqua stretch techniques to work toward full overhead reach as well as strengthening  thru arc of both overhead reach as well as ab/adduction using body weight and water for resistance at this time. Addressed core strengthening and body awareness in "space" by focusing on pt's ability to transition from standing to supine back to standing to prone - buoyancy of the water in conjunction with pt's R hemiplegic side make this activity challenging -with practice pt required only intermittent min a for these transitions.  Pt exited pool via steps using 1 hand railing and supervision                     OT Short Term Goals - 05/11/19 1558      OT SHORT TERM GOAL #1   Title  Pt will demonstrate  improved overhead reach to at least 130* of shoulder flexion RUE  to assist with heavier overhead home mgmt tasks -7/8//2020 (goal date adjusted to meet 4 visits)    Status  Achieved      OT SHORT TERM GOAL #2   Title  Pt will demonstrate improved RUE strength /core strength to assist with lifting heavy items in grocery store with greater ease (per pt report)    Status  Achieved      OT SHORT TERM GOAL #3   Title  Pt will demonstrate ability to lift and carry a 5 pound object in the grocery store with her RUE with no LOB - 06/22/2019 (date adjusted due to hold first two weeks in August due to scheduling)        OT Long Term Goals - 05/11/19 1558      OT LONG TERM GOAL #1   Title  Pt will be mod I with upgraded HEP to include aquatic based HEP - 07/20/2019    Status  On-going      OT LONG TERM GOAL #2   Title  Pt will demonstrate improved postural alignment and control to reduce falls (has been falling at least 1x/month)    Status  On-going            Plan - 05/11/19 1559    Clinical Impression Statement  Pt continues to progress toward goals demonstrating improved core strength and alignment as well as improved dynamic standing balance    OT Occupational Profile and History  Problem Focused Assessment - Including review of records relating to presenting problem    Occupational performance deficits (Please refer to evaluation for details):  IADL's;Leisure;Social Participation    Body Structure / Function / Physical Skills  Balance;Mobility;UE functional use;Strength;IADL;Tone;ROM;GMC    Clinical Decision Making  Limited treatment options, no task modification necessary    Comorbidities Affecting Occupational Performance:  May have comorbidities impacting occupational performance    Modification or Assistance to Complete Evaluation   No modification of tasks or assist necessary to complete eval    OT Frequency  1x / week    OT Duration  8 weeks    OT Treatment/Interventions   Aquatic Therapy;Self-care/ADL training;Neuromuscular education;Therapeutic exercise;Functional Mobility Training;Balance training    Plan  aquatic therapy focused on NMR for RUE functional use, postural alignment and control, RLE strengtening and balance.     Consulted and Agree with Plan of Care  Patient       Patient will benefit from skilled therapeutic intervention in order to improve the following deficits and impairments:   Body Structure / Function / Physical Skills: Balance, Mobility, UE functional use, Strength, IADL, Tone, ROM, GMC       Visit Diagnosis: 1. Spastic  hemiplegia of right dominant side as late effect of other cerebrovascular disease ([place])   2. Unsteadiness on feet   3. Muscle weakness (generalized)   4. Abnormal posture       Problem List Patient Active Problem List   Diagnosis Date Noted  . Depression 03/23/2017  . Anxiety 03/23/2017  . Muscle weakness 02/03/2017  . History of hemorrhagic stroke with residual hemiparesis ([place]) 12/10/2016  . Spastic hemiplegia and hemiparesis affecting dominant side ([place]) 09/13/2015  . Adhesive capsulitis of right shoulder 08/24/2015  . Localization-related symptomatic epilepsy and epileptic syndromes with simple partial seizures, not intractable, without status epilepticus ([place]) 08/21/2015  . Nontraumatic cortical hemorrhage of cerebral hemisphere ([place])   . Seizure ([place])   . Seizures ([place])   . Adjustment disorder with depressed mood   . Contracture of muscle ankle and foot 06/11/2015  . Hemiplga fol ntrm intcrbl hemor aff right dominant side ([place]) 06/06/2015  . Left-sided intracerebral hemorrhage ([place]) 06/04/2015  . Seizure disorder as sequela of cerebrovascular accident ([place])   . Seizure disorder ([place]) 06/03/2015  . Sepsis ([place]) 06/03/2015  . UTI (urinary tract infection) 06/03/2015  . Hypotension 06/02/2015  . Right spastic hemiparesis ([place]) 05/28/2015  . Aphasia following nontraumatic intracerebral hemorrhage  05/28/2015  . History of anxiety disorder 05/28/2015  . ICH (intracerebral hemorrhage) ([place])   . Seizure disorder, nonconvulsive, with status epilepticus ([place])   . Cerebral venous thrombosis of cortical vein   . Cytotoxic cerebral edema ([place])   . IVH (intraventricular hemorrhage) ([place])   . Term pregnancy 05/07/2015  . Spontaneous vaginal delivery 05/07/2015    Quay Burow, OTR/L 05/11/2019, 4:08 PM  [facility] [address] [place], [place], [address] Phone: [phone]   Fax:  [phone]  Name: Wandy Bossler MRN: [id number] Date of Birth: 08-Jan-1976

## 2019-05-16 ENCOUNTER — Other Ambulatory Visit: Payer: Self-pay

## 2019-05-16 ENCOUNTER — Encounter: Payer: Self-pay | Admitting: Physical Therapy

## 2019-05-16 ENCOUNTER — Ambulatory Visit: Payer: Medicare Other | Attending: Physical Medicine & Rehabilitation | Admitting: Physical Therapy

## 2019-05-16 DIAGNOSIS — M25671 Stiffness of right ankle, not elsewhere classified: Secondary | ICD-10-CM | POA: Diagnosis not present

## 2019-05-16 DIAGNOSIS — M6281 Muscle weakness (generalized): Secondary | ICD-10-CM | POA: Diagnosis not present

## 2019-05-16 DIAGNOSIS — R296 Repeated falls: Secondary | ICD-10-CM | POA: Diagnosis not present

## 2019-05-16 DIAGNOSIS — M25611 Stiffness of right shoulder, not elsewhere classified: Secondary | ICD-10-CM | POA: Insufficient documentation

## 2019-05-16 DIAGNOSIS — I69851 Hemiplegia and hemiparesis following other cerebrovascular disease affecting right dominant side: Secondary | ICD-10-CM | POA: Insufficient documentation

## 2019-05-16 DIAGNOSIS — R2681 Unsteadiness on feet: Secondary | ICD-10-CM | POA: Diagnosis not present

## 2019-05-16 DIAGNOSIS — R293 Abnormal posture: Secondary | ICD-10-CM | POA: Diagnosis not present

## 2019-05-16 NOTE — Therapy (Signed)
[facility] [address] [place], [place], [address] Phone: [phone]   Fax:  [phone]  Physical Therapy Treatment  Patient Details  Name: Ashley [name] MRN: [id number] Date of Birth: 08-24-1976 Referring Provider (PT): Marton Redwood, MD   Encounter Date: 05/16/2019   CLINIC OPERATION CHANGES: Outpatient [facility] Rehab is open at lower capacity following universal masking, social distancing, and patient screening.  The patient's COVID risk of complications score is 0.   PT End of Session - 05/16/19 1609    Visit Number  2    Number of Visits  17    Date for PT Re-Evaluation  07/09/19    Authorization Type  Medicare and BCBS - 10th visit PN required.  30 VL for OT; 30 VL for PT - 3 used    Authorization - Visit Number  5    Authorization - Number of Visits  30    PT Start Time  1450    PT Stop Time  1533    PT Time Calculation (min)  43 min    Activity Tolerance  Patient tolerated treatment well    Behavior During Therapy  WFL for tasks assessed/performed       Past Medical History:  Diagnosis Date  . Anxiety   . Depression   . ICH (intracerebral hemorrhage) ([place])   . Seizures ([place])   . Stroke [facility])     Past Surgical History:  Procedure Laterality Date  . DILATION AND CURETTAGE OF UTERUS      There were no vitals filed for this visit.  Subjective Assessment - 05/16/19 1459    Subjective  Had anti-spasticity injection last week; was able to tolerate better.  Orthotist present today to assess for new R AFO.    Pertinent History  : L CVA with ICH, R hemiplegia, contracture of R ankle muscles, R shoulder adhesive capsulitis, multiple falls with injury, seizures, depression and anxiety    Limitations  Standing;Walking    Patient Stated Goals  More stability, prevent falls, would like to be able to run    Currently in Pain?  No/denies                       [facility] Adult PT Treatment/Exercise -  05/16/19 1524      Ambulation/Gait   Ambulation/Gait  Yes    Ambulation/Gait Assistance  6: Modified independent (Device/Increase time)    Ambulation/Gait Assistance Details  performed gait assessment with orthotist observing first with patient's current AFO, then barefoot without shoe or AFO and then with [place] Reaction PLS AFO to assess gait mechanics and changes in mechanics with different AFO.  With Reaction PLS pt demonstrated significant improvement in tibial translation in stance phase, increased knee flexion during swing phase, decreased hip hike on R side and was able to maintain adequate toe clearance    Ambulation Distance (Feet)  250 Feet    Assistive device  None    Ambulation Surface  Level;Indoor    Pre-Gait Activities  with orthotist present assessed pt's functional, closed chain ankle DF ROM in WB with shoe donned (no AFO) and shoe doffed with feet even and then with L foot forwards.  Following injection and aquatic therapy pt demonstrates improved ability to translate tibia over foot and keep heel down on floor while keeping hip in neutral (did not demonstrate excessive trunk flexion to keep pelvis posterior).  When L foot placed forwards pt began to demonstrate clawing of  toes and foot supination    Gait Comments  orthotist recommending that pt change to [place] Walk On PLS and to purchase more neutral shoe with decreased medial support (further supinating R foot).  Will also look at adding a wedge to lateral shoe to shift patient's weight more towards middle of foot and off of lateral side.  Also recommended pt purchase 9W shoe to allow adequate room for foot, AFO and wedge to decrease clawing of toes.  Orthotist and PT also discussed with pt use of PRAFO at night to further increase ankle DF ROM.  Orthotist may also recommend T strap to further control ankle medial/lateral movement.      Exercises   Exercises  Ankle      Ankle Exercises: Stretches   Gastroc Stretch  2 reps;30  seconds    Gastroc Stretch Limitations  standing with R heel off edge of step.  Instructed pt how to perform at home on her bottom step             PT Education - 05/16/19 1608    Education provided  Yes    Education Details  AFO recommendations, shoe recommendations, ankle gastroc stretch for home    Person(s) Educated  Patient    Methods  Explanation;Demonstration    Comprehension  Verbalized understanding;Returned demonstration       PT Short Term Goals - 05/10/19 1020      PT SHORT TERM GOAL #1   Title  Patient will demonstrate independence with updated stretching and strengthening HEP    Time  4    Period  Weeks    Status  New    Target Date  06/09/19      PT SHORT TERM GOAL #2   Title  Pt will participate in further assessment of falls risk during gait with gait velocity and FGA    Time  4    Period  Weeks    Status  New    Target Date  06/09/19      PT SHORT TERM GOAL #3   Title  Pt will participate in assessment for new and more appropriate AFO    Time  4    Period  Weeks    Status  New    Target Date  06/09/19      PT SHORT TERM GOAL #4   Title  Pt will tolerate initial set up and fitting of functional electrical stimulation and will begin ROM and gait training with Bioness on RLE (if cleared medically by physician)    Time  4    Period  Weeks    Status  New    Target Date  06/09/19      PT SHORT TERM GOAL #5   Title  Pt will participate in assessment of RLE mechanics when performing exercise on elliptical    Time  4    Period  Weeks    Status  New    Target Date  06/09/19        PT Long Term Goals - 05/10/19 1023      PT LONG TERM GOAL #1   Title  Pt will demonstrate independence with final HEP and improved RLE mechanics on elliptical when using for aerobic exercise    Time  8    Period  Weeks    Status  New    Target Date  07/09/19      PT LONG TERM GOAL #2   Title  Pt will  demonstrate 4 point improvement in FGA to indicate decreased  falls risk    Baseline  TBD    Time  8    Period  Weeks    Status  New    Target Date  07/09/19      PT LONG TERM GOAL #3   Title  Pt will demonstrate improvement in gait velocity with new AFO to >/= 2.62 ft/sec    Baseline  TBD    Time  8    Period  Weeks    Status  New    Target Date  07/09/19      PT LONG TERM GOAL #4   Title  Pt will demonstrate 5 deg improvement in passive or active R ankle eversion and 20 degree improvement in ankle DF to improve gait mechanics    Baseline  10 deg inversion, lacking 45 deg to neutral DF    Time  8    Period  Weeks    Status  New    Target Date  07/09/19      PT LONG TERM GOAL #5   Title  Pt will demonstrate ability to don new AFO and will tolerate full day of wear    Time  8    Period  Weeks    Status  New    Target Date  07/09/19            Plan - 05/16/19 1609    Clinical Impression Statement  Treatment session focused on consultation and assessment with orthotist for most appropriate shoe wear and AFO due to patient's current AFO providing too much support and preventing anterior tibial translation further promoting gastroc shortening.  See gait for full recommendations.  Reviewed initial gastroc stretch for home.  Will continue to address in order to progress towards LTG.    Personal Factors and Comorbidities  Comorbidity 2;Time since onset of injury/illness/exacerbation;Age    Comorbidities  L CVA with ICH, R hemiplegia, contracture of R ankle muscles, R shoulder adhesive capsulitis, multiple falls with injury, seizures, depression and anxiety, pt is a mother to young children    Examination-Activity Limitations  Locomotion Level;Squat;Stairs;Stand;Caring for Others    Examination-Participation Restrictions  Cleaning;Shop;Community Activity    Stability/Clinical Decision Making  Evolving/Moderate complexity    Rehab Potential  Good    Clinical Impairments Affecting Rehab Potential  time since onset, mm contracture in R ankle     PT Frequency  2x / week    PT Duration  8 weeks    PT Treatment/Interventions  ADLs/Self Care Home Management;Electrical Stimulation;Gait training;Stair training;Functional mobility training;Therapeutic activities;Therapeutic exercise;Balance training;Neuromuscular re-education;Patient/family education;Orthotic Fit/Training;Passive range of motion;Dry needling;Taping    PT Next Visit Plan  Assess FGA and gait velocity.  start intense stretching program for RLE - gastroc, posterior tib, hamstring, hip flexor.  RLE strengthening.  Bioness RLE.  Watch mechanics on elliptical.  Balance training - rockerboard.    Consulted and Agree with Plan of Care  Patient       Patient will benefit from skilled therapeutic intervention in order to improve the following deficits and impairments:  Abnormal gait, Decreased balance, Decreased mobility, Decreased range of motion, Decreased strength, Difficulty walking, Hypomobility, Increased muscle spasms, Impaired flexibility, Impaired tone, Impaired sensation  Visit Diagnosis: 1. Spastic hemiplegia of right dominant side as late effect of other cerebrovascular disease ([place])   2. Unsteadiness on feet   3. Muscle weakness (generalized)   4. Repeated falls   5. Stiffness of right ankle,  not elsewhere classified        Problem List Patient Active Problem List   Diagnosis Date Noted  . Depression 03/23/2017  . Anxiety 03/23/2017  . Muscle weakness 02/03/2017  . History of hemorrhagic stroke with residual hemiparesis ([place]) 12/10/2016  . Spastic hemiplegia and hemiparesis affecting dominant side ([place]) 09/13/2015  . Adhesive capsulitis of right shoulder 08/24/2015  . 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 Nontraumatic cortical hemorrhage of cerebral hemisphere ([place])   . Seizure ([place])   . Seizures ([place])   . Adjustment disorder with  depressed mood   . Contracture of muscle ankle and foot 06/11/2015  . Hemiplga fol ntrm intcrbl hemor aff right dominant side ([place]) 06/06/2015  . Left-sided intracerebral hemorrhage ([place]) 06/04/2015  . Seizure disorder as sequela of cerebrovascular accident ([place])   . Seizure disorder ([place]) 06/03/2015  . Sepsis ([place]) 06/03/2015  . UTI (urinary tract infection) 06/03/2015  . Hypotension 06/02/2015  . Right spastic hemiparesis ([place]) 05/28/2015  . Aphasia following nontraumatic intracerebral hemorrhage 05/28/2015  . History of anxiety disorder 05/28/2015  . ICH (intracerebral hemorrhage) ([place])   . Seizure disorder, nonconvulsive, with status epilepticus ([place])   . Cerebral venous thrombosis of cortical vein   . Cytotoxic cerebral edema ([place])   . IVH (intraventricular hemorrhage) ([place])   . Term pregnancy 05/07/2015  . Spontaneous vaginal delivery 05/07/2015    [name], PT, DPT 05/16/19    4:15 PM    [facility] [address] [place], [place], [address] Phone: [phone]   Fax:  [phone]  Name: Ashley [name] MRN: [id number] Date of Birth: 1976-07-04

## 2019-05-16 NOTE — Patient Instructions (Signed)
Gastroc / Heel Cord Stretch - On Step    Stand with heels over edge of stair. Holding rail, lower heels until stretch is felt in calf of legs. Repeat _3__ times. Do _2__ times per day.  Copyright  VHI. All rights reserved.

## 2019-05-18 ENCOUNTER — Other Ambulatory Visit: Payer: Self-pay

## 2019-05-18 ENCOUNTER — Encounter: Payer: Self-pay | Admitting: Physical Therapy

## 2019-05-18 ENCOUNTER — Ambulatory Visit: Payer: Medicare Other | Admitting: Physical Therapy

## 2019-05-18 DIAGNOSIS — M25671 Stiffness of right ankle, not elsewhere classified: Secondary | ICD-10-CM

## 2019-05-18 DIAGNOSIS — R296 Repeated falls: Secondary | ICD-10-CM | POA: Diagnosis not present

## 2019-05-18 DIAGNOSIS — R293 Abnormal posture: Secondary | ICD-10-CM | POA: Diagnosis not present

## 2019-05-18 DIAGNOSIS — M6281 Muscle weakness (generalized): Secondary | ICD-10-CM

## 2019-05-18 DIAGNOSIS — R2681 Unsteadiness on feet: Secondary | ICD-10-CM | POA: Diagnosis not present

## 2019-05-18 DIAGNOSIS — I69851 Hemiplegia and hemiparesis following other cerebrovascular disease affecting right dominant side: Secondary | ICD-10-CM | POA: Diagnosis not present

## 2019-05-18 DIAGNOSIS — M25611 Stiffness of right shoulder, not elsewhere classified: Secondary | ICD-10-CM | POA: Diagnosis not present

## 2019-05-18 NOTE — Therapy (Signed)
[facility] [address] [place], [place], [address] Phone: [phone]   Fax:  [phone]  Physical Therapy Treatment  Patient Details  Name: Ashley [name] MRN: [id number] Date of Birth: [birth year] Referring Provider (PT): Marton Redwood, MD   Encounter Date: 05/18/2019   CLINIC OPERATION CHANGES: [facility]patient [facility] is open at lower capacity following universal masking, social distancing, and patient screening.  The patient's COVID risk of complications score is 0.   PT End of Session - 05/18/19 1209    Visit Number  3    Number of Visits  17    Date for PT Re-Evaluation  07/09/19    Authorization Type  Medicare and [employer] - 10th visit PN required.  30 VL for OT; 30 VL for PT - 3 used    Authorization - Visit Number  6    Authorization - Number of Visits  30    PT Start Time  1106    PT Stop Time  1145    PT Time Calculation (min)  39 min    Equipment Utilized During Treatment  Other (comment)   Bioness   Activity Tolerance  Patient tolerated treatment well    Behavior During Therapy  WFL for tasks assessed/performed       Past Medical History:  Diagnosis Date  . Anxiety   . Depression   . ICH (intracerebral hemorrhage) ([place])   . Seizures ([place])   . Stroke [facility])     Past Surgical History:  Procedure Laterality Date  . DILATION AND CURETTAGE OF UTERUS      There were no vitals filed for this visit.  Subjective Assessment - 05/18/19 1109    Subjective  No questions about AFO, hoping insurance will cover.  Is excited to get something different. Tried gastroc stretch on stairs at home, "feels good".    Pertinent History  : L CVA with ICH, R hemiplegia, contracture of R ankle muscles, R shoulder adhesive capsulitis, multiple falls with injury, seizures, depression and anxiety    Limitations  Standing;Walking    Patient Stated Goals  More stability, prevent falls, would like to be able to run    Currently  in Pain?  No/denies         [facility] PT Assessment - 05/18/19 1141      Standardized Balance Assessment   Standardized Balance Assessment  10 meter walk test;Dynamic Gait Index    10 Meter Walk  10.28 seconds or 3.2 ft/sec      Dynamic Gait Index   Level Surface  Mild Impairment    Change in Gait Speed  Mild Impairment    Gait with Horizontal Head Turns  Mild Impairment    Gait with Vertical Head Turns  Mild Impairment    Gait and Pivot Turn  Normal    Step Over Obstacle  Normal    Step Around Obstacles  Normal    Steps  Mild Impairment    Total Score  19    DGI comment:  19/24 increased risk for falls                   [facility] Adult PT Treatment/Exercise - 05/18/19 1203      Ambulation/Gait   Ambulation/Gait  Yes    Ambulation/Gait Assistance  6: Modified independent (Device/Increase time)    Ambulation/Gait Assistance Details  with Bioness over level ground with parameters adjusted to improve ankle DF with decreased ankle supination and to improve anterior tibial  translation over R foot during mid stance phase.      Ambulation Distance (Feet)  230 Feet    Assistive device  None    Gait Pattern  Step-through pattern;Decreased weight shift to right;Right hip hike;Decreased trunk rotation;Wide base of support;Decreased arm swing - right    Ambulation Surface  Level;Indoor      Therapeutic Activites    Therapeutic Activities  Other Therapeutic Activities    Other Therapeutic Activities  education provided regarding purpose and use of Bioness as functional NMR and electrical stimulation, discussed if pt would be a candidate for home unit and pros/cons of home unit.  Demonstrated to pt how to perform partial step downs at home; will provide pictures at next visit      Modalities   Modalities  Electrical Stimulation      Electrical Stimulation   Electrical Stimulation Location  R anterior tibialis and hamstring mm groups    Electrical Stimulation Action  open and closed  chain ankle DF, knee flexion and hip extension    Electrical Stimulation Parameters  See tablet 1 for parameters; steering electrode    Electrical Stimulation Goals  Strength;Tone;Neuromuscular facilitation             PT Education - 05/18/19 1208    Education provided  Yes    Education Details  see TA    Person(s) Educated  Patient    Methods  Explanation;Demonstration    Comprehension  Need further instruction       PT Short Term Goals - 05/18/19 1213      PT SHORT TERM GOAL #1   Title  Patient will demonstrate independence with updated stretching and strengthening HEP    Time  4    Period  Weeks    Status  On-going    Target Date  06/09/19      PT SHORT TERM GOAL #2   Title  Pt will participate in further assessment of falls risk during gait with gait velocity and FGA    Time  4    Period  Weeks    Status  Achieved    Target Date  06/09/19      PT SHORT TERM GOAL #3   Title  Pt will participate in assessment for new and more appropriate AFO    Time  4    Period  Weeks    Status  Achieved    Target Date  06/09/19      PT SHORT TERM GOAL #4   Title  Pt will tolerate initial set up and fitting of functional electrical stimulation and will begin ROM and gait training with Bioness on RLE (if cleared medically by physician)    Time  4    Period  Weeks    Status  Achieved    Target Date  06/09/19      PT SHORT TERM GOAL #5   Title  Pt will participate in assessment of RLE mechanics when performing exercise on elliptical    Time  4    Period  Weeks    Status  New    Target Date  06/09/19        PT Long Term Goals - 05/18/19 1214      PT LONG TERM GOAL #1   Title  Pt will demonstrate independence with final HEP and improved RLE mechanics on elliptical when using for aerobic exercise (07/09/19)    Time  8    Period  Weeks  Status  New      PT LONG TERM GOAL #2   Title  Pt will demonstrate 3 point improvement in DGI to indicate decreased falls risk     Baseline  19/24    Time  8    Period  Weeks    Status  Revised      PT LONG TERM GOAL #3   Title  Pt will demonstrate improvement in gait velocity with new AFO to >/= 3.6 ft/sec    Baseline  3.19 with Bioness    Time  8    Period  Weeks    Status  Revised      PT LONG TERM GOAL #4   Title  Pt will demonstrate 5 deg improvement in passive or active R ankle eversion and 20 degree improvement in ankle DF to improve gait mechanics    Baseline  10 deg inversion, lacking 45 deg to neutral DF    Time  8    Period  Weeks    Status  New      PT LONG TERM GOAL #5   Title  Pt will demonstrate ability to don new AFO and will tolerate full day of wear    Time  8    Period  Weeks    Status  New            Plan - 05/18/19 1209    Clinical Impression Statement  Treatment session focused on initiation of use of functional electrical stimulation on RLE for NMR and gait training.  Pt responded well to stimulation and even demonstrated reduced ankle supination during swing phase.  Pt also noted to have increased active DF and improved passive ankle DF and eversion ROM today.  Performed assessment of gait velocity and DGI with Bioness with score of 19/24 indicating ongoing falls risk.  Will continue to utilize in order to continue to progress towards LTG.    Personal Factors and Comorbidities  Comorbidity 2;Time since onset of injury/illness/exacerbation;Age    Comorbidities  L CVA with ICH, R hemiplegia, contracture of R ankle muscles, R shoulder adhesive capsulitis, multiple falls with injury, seizures, depression and anxiety, pt is a mother to young children    Examination-Activity Limitations  Locomotion Level;Squat;Stairs;Stand;Caring for Others    Examination-Participation Restrictions  Cleaning;Shop;Community Activity    Stability/Clinical Decision Making  Evolving/Moderate complexity    Rehab Potential  Good    Clinical Impairments Affecting Rehab Potential  time since onset, mm  contracture in R ankle    PT Frequency  2x / week    PT Duration  8 weeks    PT Treatment/Interventions  ADLs/Self Care Home Management;Electrical Stimulation;Gait training;Stair training;Functional mobility training;Therapeutic activities;Therapeutic exercise;Balance training;Neuromuscular re-education;Patient/family education;Orthotic Fit/Training;Passive range of motion;Dry needling;Taping    PT Next Visit Plan  Bioness and start intense stretching program for RLE - gastroc, posterior tib, hamstring, hip flexor.  RLE strengthening. Watch mechanics on elliptical.  Balance training - rockerboard. Stair Industrial/product designer with Plan of Care  Patient       Patient will benefit from skilled therapeutic intervention in order to improve the following deficits and impairments:  Abnormal gait, Decreased balance, Decreased mobility, Decreased range of motion, Decreased strength, Difficulty walking, Hypomobility, Increased muscle spasms, Impaired flexibility, Impaired tone, Impaired sensation  Visit Diagnosis: 1. Spastic hemiplegia of right dominant side as late effect of other cerebrovascular disease ([place])   2. Unsteadiness on feet   3. Muscle weakness (generalized)  4. Repeated falls   5. Stiffness of right ankle, not elsewhere classified        Problem List Patient Active Problem List   Diagnosis Date Noted  . Depression 03/23/2017  . Anxiety 03/23/2017  . Muscle weakness 02/03/2017  . History of hemorrhagic stroke with residual hemiparesis ([place]) 12/10/2016  . Spastic hemiplegia and hemiparesis affecting dominant side ([place]) 09/13/2015  . Adhesive capsulitis of right shoulder 08/24/2015  . Localization-related symptomatic epilepsy and epileptic syndromes with simple partial seizures, not intractable, without status epilepticus ([place]) 08/21/2015  . Nontraumatic cortical hemorrhage of cerebral hemisphere ([place])   . Seizure ([place])   . Seizures ([place])   . Adjustment disorder with  depressed mood   . Contracture of muscle ankle and foot 06/11/2015  . Hemiplga fol ntrm intcrbl hemor aff right dominant side ([place]) 06/06/2015  . Left-sided intracerebral hemorrhage ([place]) 06/04/2015  . Seizure disorder as sequela of cerebrovascular accident ([place])   . Seizure disorder ([place]) 06/03/2015  . Sepsis ([place]) 06/03/2015  . UTI (urinary tract infection) 06/03/2015  . Hypotension 06/02/2015  . Right spastic hemiparesis ([employer]) 05/28/2015  . Aphasia following nontraumatic intracerebral hemorrhage 05/28/2015  . History of anxiety disorder 05/28/2015  . ICH (intracerebral hemorrhage) ([place])   . Seizure disorder, nonconvulsive, with status epilepticus ([place])   . Cerebral venous thrombosis of cortical vein   . Cytotoxic cerebral edema ([place])   . IVH (intraventricular hemorrhage) ([place])   . Term pregnancy 05/07/2015  . Spontaneous vaginal delivery 05/07/2015    [name], PT, DPT 05/18/19    12:17 PM    [facility] [address] [place], [place], [address] Phone: [phone]   Fax:  [phone]  Name: Ashley [name] MRN: [id number] Date of Birth: 11/12/1975

## 2019-05-18 NOTE — Patient Instructions (Signed)
Gastroc / Heel Cord Stretch - On Step    Stand with heels over edge of stair. Holding rail, lower heels until stretch is felt in calf of legs. Repeat _3__ times. Do _2__ times per day.   Step-Down: Forward    Stand on first step and lower left leg as far as possible to floor until you feel a stretch in your right calf.  Try to keep your knee pointing forwards as much as you can.  Hold for 30 seconds and then rest. Perform 5 repetitions, 2 times a day.

## 2019-05-23 ENCOUNTER — Other Ambulatory Visit: Payer: Self-pay

## 2019-05-23 ENCOUNTER — Encounter: Payer: Self-pay | Admitting: Physical Therapy

## 2019-05-23 ENCOUNTER — Ambulatory Visit: Payer: Medicare Other | Admitting: Physical Therapy

## 2019-05-23 DIAGNOSIS — M25611 Stiffness of right shoulder, not elsewhere classified: Secondary | ICD-10-CM | POA: Diagnosis not present

## 2019-05-23 DIAGNOSIS — M6281 Muscle weakness (generalized): Secondary | ICD-10-CM | POA: Diagnosis not present

## 2019-05-23 DIAGNOSIS — R296 Repeated falls: Secondary | ICD-10-CM | POA: Diagnosis not present

## 2019-05-23 DIAGNOSIS — I69851 Hemiplegia and hemiparesis following other cerebrovascular disease affecting right dominant side: Secondary | ICD-10-CM

## 2019-05-23 DIAGNOSIS — M25671 Stiffness of right ankle, not elsewhere classified: Secondary | ICD-10-CM | POA: Diagnosis not present

## 2019-05-23 DIAGNOSIS — R2681 Unsteadiness on feet: Secondary | ICD-10-CM

## 2019-05-23 DIAGNOSIS — R293 Abnormal posture: Secondary | ICD-10-CM | POA: Diagnosis not present

## 2019-05-23 NOTE — Therapy (Signed)
[facility] [address] [place], [place], [address] Phone: [phone]   Fax:  [phone]  Physical Therapy Treatment  Patient Details  Name: Ashley [name] MRN: [id number] Date of Birth: [birth year] Referring Provider (PT): Marton Redwood, MD   Encounter Date: 05/23/2019  PT End of Session - 05/23/19 1256    Visit Number  4    Number of Visits  17    Date for PT Re-Evaluation  07/09/19    Authorization Type  Medicare and [place] - 10th visit PN required.  30 VL for OT; 30 VL for PT - 3 used    Authorization - Visit Number  7    Authorization - Number of Visits  30    PT Start Time  5009    PT Stop Time  1150    PT Time Calculation (min)  45 min    Equipment Utilized During Treatment  Other (comment)   Bioness   Activity Tolerance  Patient tolerated treatment well    Behavior During Therapy  WFL for tasks assessed/performed       Past Medical History:  Diagnosis Date   Anxiety    Depression    ICH (intracerebral hemorrhage) ([place])    Seizures ([place])    Stroke ([place])     Past Surgical History:  Procedure Laterality Date   DILATION AND CURETTAGE OF UTERUS      There were no vitals filed for this visit.  Subjective Assessment - 05/23/19 1111    Subjective  [place].  No issues or falls to report.  Will need to cancel Thursday's appointment, is going to the beach.    Pertinent History  : L CVA with ICH, R hemiplegia, contracture of R ankle muscles, R shoulder adhesive capsulitis, multiple falls with injury, seizures, depression and anxiety    Limitations  Standing;Walking    Patient Stated Goals  More stability, prevent falls, would like to be able to run    Currently in Pain?  No/denies                       [facility] Adult PT Treatment/Exercise - 05/23/19 1249      Exercises   Exercises  Knee/Hip;Ankle      Knee/Hip Exercises: Standing   Knee Flexion   Strengthening;Right;Left;1 set;10 reps    Knee Flexion Limitations  Standing at counter; Bioness in training mode.  Performed alternating hamstring curls with cues to keep knee down and trunk more upright.  Required bilat UE support    Step Down  Left;10 reps;Hand Hold: 1;Step Height: 4";2 sets    Step Down Limitations  With Bioness in training mode; Left foot tap down to floor first with toes tap down and then heel tap down with cues to keep R heel on step and allow knee to flex and come forward over R foot; min A.  Did not provide this exercise for pt at home yet    Functional Squat  1 set;10 reps;5 seconds    Functional Squat Limitations  from mat with Bioness in training mode; cued to keep hands on thighs to keep WB through RLE and heel on floor      Knee/Hip Exercises: Seated   Hamstring Curl  --    Hamstring Limitations  --      Modalities   Modalities  Electrical Stimulation      Electrical Stimulation   Electrical Stimulation Location  R  anterior tibialis and hamstring mm groups    Electrical Stimulation Action  open and closed chain ankle DF, knee flexion and hip extension    Electrical Stimulation Parameters  See tablet 1 for parameters; steering electrode    Electrical Stimulation Goals  Strength;Tone;Neuromuscular facilitation      Ankle Exercises: Stretches   Soleus Stretch  5 reps;Other (comment)   15 seconds; see description below   Gastroc Stretch  5 reps;Other (comment)   15 seconds   Gastroc Stretch Limitations  With Bioness in training mode performed with R heel off back edge of step and UE support on counter top.  Performed 5 reps x 15 second hold.  Turned around and performed L foot forwards off step with RLE in closed chain ankle DF and knee flexion for functional stretch of gastroc/soleus to improve descending stairs       Gastroc / Heel Cord Stretch - On Step    Stand with heels over edge of stair. Holding rail, lower heels until stretch is felt in calf of  legs. Repeat _3__ times. Do _2__ times per day.   Step-Down: Forward    Stand on first step and lower left leg as far as possible to floor until you feel a stretch in your right calf.  Try to keep your knee pointing forwards as much as you can.  Hold for 30 seconds and then rest. Perform 5 repetitions, 2 times a day.    Alternating Hamstring Curl    Stand on both legs, one hand touching the counter top.  Inhaling, shift weight onto left leg. Exhaling, bend right leg, heel toward buttock then inhale and placing right leg down, shift weight onto right leg. Repeat with other leg. Repeat _10__ times, alternating legs. Do _2__ times per day.  Focus on keeping your posture up tall and don't let the knee drift too far forwards.   Squat    Keeping feet flat on floor shoulder width apart, trunk straight, squat bending knees 30. Hold position __10__ seconds. Repeat __10__ times per session. Do __2  sessions per day.  Copyright  VHI. All rights reserved.           PT Education - 05/23/19 1256    Education provided  Yes    Education Details  continued to review HEP    Person(s) Educated  Patient    Methods  Explanation;Demonstration;Handout    Comprehension  Verbalized understanding;Returned demonstration       PT Short Term Goals - 05/18/19 1213      PT SHORT TERM GOAL #1   Title  Patient will demonstrate independence with updated stretching and strengthening HEP    Time  4    Period  Weeks    Status  On-going    Target Date  06/09/19      PT SHORT TERM GOAL #2   Title  Pt will participate in further assessment of falls risk during gait with gait velocity and FGA    Time  4    Period  Weeks    Status  Achieved    Target Date  06/09/19      PT SHORT TERM GOAL #3   Title  Pt will participate in assessment for new and more appropriate AFO    Time  4    Period  Weeks    Status  Achieved    Target Date  06/09/19      PT SHORT TERM GOAL #4   Title  Pt will  tolerate initial set up and fitting of functional electrical stimulation and will begin ROM and gait training with Bioness on RLE (if cleared medically by physician)    Time  4    Period  Weeks    Status  Achieved    Target Date  06/09/19      PT SHORT TERM GOAL #5   Title  Pt will participate in assessment of RLE mechanics when performing exercise on elliptical    Time  4    Period  Weeks    Status  New    Target Date  06/09/19        PT Long Term Goals - 05/18/19 1214      PT LONG TERM GOAL #1   Title  Pt will demonstrate independence with final HEP and improved RLE mechanics on elliptical when using for aerobic exercise (07/09/19)    Time  8    Period  Weeks    Status  New      PT LONG TERM GOAL #2   Title  Pt will demonstrate 3 point improvement in DGI to indicate decreased falls risk    Baseline  19/24    Time  8    Period  Weeks    Status  Revised      PT LONG TERM GOAL #3   Title  Pt will demonstrate improvement in gait velocity with new AFO to >/= 3.6 ft/sec    Baseline  3.19 with Bioness    Time  8    Period  Weeks    Status  Revised      PT LONG TERM GOAL #4   Title  Pt will demonstrate 5 deg improvement in passive or active R ankle eversion and 20 degree improvement in ankle DF to improve gait mechanics    Baseline  10 deg inversion, lacking 45 deg to neutral DF    Time  8    Period  Weeks    Status  New      PT LONG TERM GOAL #5   Title  Pt will demonstrate ability to don new AFO and will tolerate full day of wear    Time  8    Period  Weeks    Status  New            Plan - 05/23/19 1256    Clinical Impression Statement  Continued to focus on and review exercises for patient to perform at home for increased ankle ROM into DF and LE strengthening.  Utilized Bioness in training mode for [employer].  Pt tolerated well.  Will continue to review and progress towards LTG.    Personal Factors and Comorbidities  Comorbidity 2;Time since onset of  injury/illness/exacerbation;Age    Comorbidities  L CVA with ICH, R hemiplegia, contracture of R ankle muscles, R shoulder adhesive capsulitis, multiple falls with injury, seizures, depression and anxiety, pt is a mother to young children    Examination-Activity Limitations  Locomotion Level;Squat;Stairs;Stand;Caring for Others    Examination-Participation Restrictions  Cleaning;Shop;Community Activity    Stability/Clinical Decision Making  Evolving/Moderate complexity    Rehab Potential  Good    Clinical Impairments Affecting Rehab Potential  time since onset, mm contracture in R ankle    PT Frequency  2x / week    PT Duration  8 weeks    PT Treatment/Interventions  ADLs/Self Care Home Management;Electrical Stimulation;Gait training;Stair training;Functional mobility training;Therapeutic activities;Therapeutic exercise;Balance training;Neuromuscular re-education;Patient/family education;Orthotic Fit/Training;Passive range of motion;Dry  needling;Taping    PT Next Visit Plan  Perform stretches and elliptical with new shoes and no AFO; return to Bioness and start intense stretching program for RLE - gastroc, posterior tib, hamstring, hip flexor.  RLE strengthening. Watch mechanics on elliptical.  Balance training - rockerboard. Stair Industrial/product designer with Plan of Care  Patient       Patient will benefit from skilled therapeutic intervention in order to improve the following deficits and impairments:  Abnormal gait, Decreased balance, Decreased mobility, Decreased range of motion, Decreased strength, Difficulty walking, Hypomobility, Increased muscle spasms, Impaired flexibility, Impaired tone, Impaired sensation  Visit Diagnosis: 1. Spastic hemiplegia of right dominant side as late effect of other cerebrovascular disease ([place])   2. Unsteadiness on feet   3. Muscle weakness (generalized)   4. Repeated falls   5. Stiffness of right ankle, not elsewhere classified         Problem List Patient Active Problem List   Diagnosis Date Noted   Depression 03/23/2017   Anxiety 03/23/2017   Muscle weakness 02/03/2017   History of hemorrhagic stroke with residual hemiparesis ([facility]) 12/10/2016   Spastic hemiplegia and hemiparesis affecting dominant side ([place]) 09/13/2015   Adhesive capsulitis of right shoulder 08/24/2015   Localization-related symptomatic epilepsy and epileptic syndromes with simple partial seizures, not intractable, without status epilepticus ([place]) 08/21/2015   Nontraumatic cortical hemorrhage of cerebral hemisphere [facility])    Seizure ([place])    Seizures ([place])    Adjustment disorder with depressed mood    Contracture of muscle ankle and foot 06/11/2015   Hemiplga fol ntrm intcrbl hemor aff right dominant side ([place]) 06/06/2015   Left-sided intracerebral hemorrhage ([place]) 06/04/2015   Seizure disorder as sequela of cerebrovascular accident [facility])    Seizure disorder ([place]) 06/03/2015   Sepsis ([place]) 06/03/2015   UTI (urinary tract infection) 06/03/2015   Hypotension 06/02/2015   Right spastic hemiparesis ([place]) 05/28/2015   Aphasia following nontraumatic intracerebral hemorrhage 05/28/2015   History of anxiety disorder 05/28/2015   ICH (intracerebral hemorrhage) ([place])    Seizure disorder, nonconvulsive, with status epilepticus ([place])    Cerebral venous thrombosis of cortical vein    Cytotoxic cerebral edema ([facility])    IVH (intraventricular hemorrhage) ([place])    Term pregnancy 05/07/2015   Spontaneous vaginal delivery 05/07/2015    [name], PT, DPT 05/23/19    12:59 PM    [facility] [address] [place], [place], [address] Phone: [phone]   Fax:  [phone]  Name: Ashley [name] MRN: [id number] Date of Birth: October 01, 1976

## 2019-05-23 NOTE — Patient Instructions (Addendum)
Gastroc / Heel Cord Stretch - On Step    Stand with heels over edge of stair. Holding rail, lower heels until stretch is felt in calf of legs. Repeat _3__ times. Do _2__ times per day.   Step-Down: Forward    Stand on first step and lower left leg as far as possible to floor until you feel a stretch in your right calf.  Try to keep your knee pointing forwards as much as you can.  Hold for 30 seconds and then rest. Perform 5 repetitions, 2 times a day.    Alternating Hamstring Curl    Stand on both legs, one hand touching the counter top.  Inhaling, shift weight onto left leg. Exhaling, bend right leg, heel toward buttock then inhale and placing right leg down, shift weight onto right leg. Repeat with other leg. Repeat _10__ times, alternating legs. Do _2__ times per day.  Focus on keeping your posture up tall and don't let the knee drift too far forwards.   Squat    Keeping feet flat on floor shoulder width apart, trunk straight, squat bending knees 30. Hold position __10__ seconds. Repeat __10__ times per session. Do __2  sessions per day.  Copyright  VHI. All rights reserved.

## 2019-05-26 ENCOUNTER — Ambulatory Visit: Payer: Medicare Other | Admitting: Physical Therapy

## 2019-06-01 ENCOUNTER — Encounter: Payer: Self-pay | Admitting: Physical Therapy

## 2019-06-01 ENCOUNTER — Ambulatory Visit: Payer: Medicare Other | Admitting: Physical Therapy

## 2019-06-01 ENCOUNTER — Other Ambulatory Visit: Payer: Self-pay

## 2019-06-01 ENCOUNTER — Encounter: Payer: Self-pay | Admitting: Occupational Therapy

## 2019-06-01 ENCOUNTER — Ambulatory Visit: Payer: Medicare Other | Admitting: Occupational Therapy

## 2019-06-01 DIAGNOSIS — M6281 Muscle weakness (generalized): Secondary | ICD-10-CM

## 2019-06-01 DIAGNOSIS — R2681 Unsteadiness on feet: Secondary | ICD-10-CM | POA: Diagnosis not present

## 2019-06-01 DIAGNOSIS — R296 Repeated falls: Secondary | ICD-10-CM | POA: Diagnosis not present

## 2019-06-01 DIAGNOSIS — M25611 Stiffness of right shoulder, not elsewhere classified: Secondary | ICD-10-CM | POA: Diagnosis not present

## 2019-06-01 DIAGNOSIS — I69851 Hemiplegia and hemiparesis following other cerebrovascular disease affecting right dominant side: Secondary | ICD-10-CM

## 2019-06-01 DIAGNOSIS — M25671 Stiffness of right ankle, not elsewhere classified: Secondary | ICD-10-CM

## 2019-06-01 DIAGNOSIS — R293 Abnormal posture: Secondary | ICD-10-CM

## 2019-06-01 NOTE — Therapy (Signed)
[facility] [address] [place], [place], [address] Phone: [phone]   Fax:  [phone]  Occupational Therapy Treatment  Patient Details  Name: Ashley [name] MRN: [id number] Date of Birth: 07/11/1976 Referring Provider (OT): Dr Marton Redwood   Encounter Date: 06/01/2019  OT End of Session - 06/01/19 1845    Visit Number  9    Number of Visits  16    Date for OT Re-Evaluation  07/20/19    Authorization Type  medicare with [employer] - will need PN every 10th visit    Authorization - Visit Number  9    Authorization - Number of Visits  10    OT Start Time  1331    OT Stop Time  1414    OT Time Calculation (min)  43 min    Activity Tolerance  Patient tolerated treatment well       Past Medical History:  Diagnosis Date  . Anxiety   . Depression   . ICH (intracerebral hemorrhage) ([place])   . Seizures ([place])   . Stroke [facility])     Past Surgical History:  Procedure Laterality Date  . DILATION AND CURETTAGE OF UTERUS      There were no vitals filed for this visit.  Subjective Assessment - 06/01/19 1843    Subjective   I feel  more steady on my feet    Pertinent History  see epic snapshot; pt with [facility] 2 weeks after deliverying her 3rd child    Patient Stated Goals  strength of arm and get my R leg working better    Currently in Pain?  No/denies         Patient seen for aquatic therapy today.  Treatment took place in water 2.5-4 feet deep depending upon activity.  Pt entered the pool via steps using 1 hand railing and distant supervision. Pt demonstrated good postural alignment when descending stairs.  Aqua stretch techniques to address R ankle ROM for dorsiflexion and alignment prior to addressing mobility. Focused significant time on moving from sit to stand from low surface with emphasis on forward weight shift over LE's, using BLEs for sit to stand without UE support. Pt has great difficulty shifting weight forward  enough without using UE's to "pull" weight forward. Submerged pt in deeper water to allow buoyancy to assist and to allow pt to feel forward weight shift - after repetition and practice pt able to complete in deeper water. Then transitioned to more shallow water increasing demand on pt and pt able to complete 1/4 trials.  Will continue to address. Then addressed pt's ability to transition from standing to supine back to standing and then to prone using only waist belt - pt required no assistance for this but did require extra time.  In supine addressed UE strengthening using body weight as drag in water to increase resistance. Also addressed LE control, motor planning and coordination through use of alternate kicking in supine. Pt required mod assist initially however with practice and light facilitation able to progress to min assist.  Pt exited the pool via steps using 2 hand rails and distant supervision.                       OT Short Term Goals - 06/01/19 1844      OT SHORT TERM GOAL #1   Title  Pt will demonstrate improved overhead reach to at least 130* of shoulder flexion RUE  to  assist with heavier overhead home mgmt tasks -7/8//2020 (goal date adjusted to meet 4 visits)    Status  Achieved      OT SHORT TERM GOAL #2   Title  Pt will demonstrate improved RUE strength /core strength to assist with lifting heavy items in grocery store with greater ease (per pt report)    Status  Achieved      OT SHORT TERM GOAL #3   Title  Pt will demonstrate ability to lift and carry a 5 pound object in the grocery store with her RUE with no LOB - 06/22/2019 (date adjusted due to hold first two weeks in August due to scheduling)        OT Long Term Goals - 06/01/19 1844      OT LONG TERM GOAL #1   Title  Pt will be mod I with upgraded HEP to include aquatic based HEP - 07/20/2019    Status  On-going      OT LONG TERM GOAL #2   Title  Pt will demonstrate improved postural alignment  and control to reduce falls (has been falling at least 1x/month)    Status  On-going            Plan - 06/01/19 1844    Clinical Impression Statement  Pt continues with slow but steady progress toward goals. Pt with slowly improving postural alignment and awareness of body in space    OT Occupational Profile and History  Problem Focused Assessment - Including review of records relating to presenting problem    Occupational performance deficits (Please refer to evaluation for details):  IADL's;Leisure;Social Participation    Body Structure / Function / Physical Skills  Balance;Mobility;UE functional use;Strength;IADL;Tone;ROM;GMC    Rehab Potential  Good    Clinical Decision Making  Limited treatment options, no task modification necessary    Comorbidities Affecting Occupational Performance:  May have comorbidities impacting occupational performance    Modification or Assistance to Complete Evaluation   No modification of tasks or assist necessary to complete eval    OT Frequency  1x / week    OT Duration  8 weeks    OT Treatment/Interventions  Aquatic Therapy;Self-care/ADL training;Neuromuscular education;Therapeutic exercise;Functional Mobility Training;Balance training    Plan  aquatic therapy focused on NMR for RUE functional use, postural alignment and control, RLE strengtening and balance.     Consulted and Agree with Plan of Care  Patient       Patient will benefit from skilled therapeutic intervention in order to improve the following deficits and impairments:   Body Structure / Function / Physical Skills: Balance, Mobility, UE functional use, Strength, IADL, Tone, ROM, GMC       Visit Diagnosis: 1. Spastic hemiplegia of right dominant side as late effect of other cerebrovascular disease ([place])   2. Unsteadiness on feet   3. Muscle weakness (generalized)   4. Abnormal posture   5. Stiffness of right shoulder, not elsewhere classified       Problem List Patient Active  Problem List   Diagnosis Date Noted  . Depression 03/23/2017  . Anxiety 03/23/2017  . Muscle weakness 02/03/2017  . History of hemorrhagic stroke with residual hemiparesis ([place]) 12/10/2016  . Spastic hemiplegia and hemiparesis affecting dominant side ([place]) 09/13/2015  . Adhesive capsulitis of right shoulder 08/24/2015  . Localization-related symptomatic epilepsy and epileptic syndromes with simple partial seizures, not intractable, without status epilepticus ([place]) 08/21/2015  . Nontraumatic cortical hemorrhage of cerebral hemisphere ([place])   .  Seizure ([place])   . Seizures ([place])   . Adjustment disorder with depressed mood   . Contracture of muscle ankle and foot 06/11/2015  . Hemiplga fol ntrm intcrbl hemor aff right dominant side ([place]) 06/06/2015  . Left-sided intracerebral hemorrhage ([place]) 06/04/2015  . Seizure disorder as sequela of cerebrovascular accident ([place])   . Seizure disorder ([place]) 06/03/2015  . Sepsis ([place]) 06/03/2015  . UTI (urinary tract infection) 06/03/2015  . Hypotension 06/02/2015  . Right spastic hemiparesis ([place]) 05/28/2015  . Aphasia following nontraumatic intracerebral hemorrhage 05/28/2015  . History of anxiety disorder 05/28/2015  . ICH (intracerebral hemorrhage) ([place])   . Seizure disorder, nonconvulsive, with status epilepticus ([place])   . Cerebral venous thrombosis of cortical vein   . Cytotoxic cerebral edema ([place])   . IVH (intraventricular hemorrhage) ([place])   . Term pregnancy 05/07/2015  . Spontaneous vaginal delivery 05/07/2015    Quay Burow, OTR/L 06/01/2019, 6:47 PM  [facility] [address] [place], [place], [address] Phone: [phone]   Fax:  [phone]  Name: Ashley [name] MRN: [id number] Date of Birth: [birth year]

## 2019-06-01 NOTE — Therapy (Signed)
[facility] [address] [place], [place], [address] Phone: [phone]   Fax:  [phone]  Physical Therapy Treatment  Patient Details  Name: Ashley [name] MRN: [id number] Date of Birth: 03-07-1976 Referring Provider (PT): Marton Redwood, MD   Encounter Date: 06/01/2019  PT End of Session - 06/01/19 2055    Visit Number  5    Number of Visits  17    Date for PT Re-Evaluation  07/09/19    Authorization Type  Medicare and [place] - 10th visit PN required.  30 VL for OT; 30 VL for PT - 3 used    Authorization - Visit Number  8    Authorization - Number of Visits  30    PT Start Time  [id number]    PT Stop Time  1023    PT Time Calculation (min)  45 min    Activity Tolerance  Patient tolerated treatment well    Behavior During Therapy  WFL for tasks assessed/performed       Past Medical History:  Diagnosis Date  . Anxiety   . Depression   . ICH (intracerebral hemorrhage) ([place])   . Seizures ([place])   . Stroke [facility])     Past Surgical History:  Procedure Laterality Date  . DILATION AND CURETTAGE OF UTERUS      There were no vitals filed for this visit.  Subjective Assessment - 06/01/19 0939    Subjective  Had a nice time at the beach.  Has new shoes with decreased arch support and supination.    Pertinent History  : L CVA with ICH, R hemiplegia, contracture of R ankle muscles, R shoulder adhesive capsulitis, multiple falls with injury, seizures, depression and anxiety    Limitations  Standing;Walking    Patient Stated Goals  More stability, prevent falls, would like to be able to run    Currently in Pain?  No/denies                       [facility] Adult PT Treatment/Exercise - 06/01/19 2040      Lumbar Exercises: Aerobic   Elliptical  Performed elliptical x 3 minutes with new shoes but without AFO to allow therapist to assess mechanics as this is how pt performs at home.  Pt noted to have significant hip ER  and ankle supination during knee flexion phase and then moves into genu recurvatum.  When therapist manually facilitated hip into internal rotation and pelvis more forward pt demonstrated improved ankle positioning and no genu recurvatum when pushing through revolution      Knee/Hip Exercises: Stretches   Hip Flexor Stretch  Right;2 reps;30 seconds    Hip Flexor Stretch Limitations  combined with ITB stretch by using belt to bring heel towards buttocks for hip flexor stretch in prone    ITB Stretch  Right;2 reps;30 seconds    ITB Stretch Limitations  Performed first in supine with strap around foot crossing LE over midline with minimal stretch felt.  Changed to prone with R knee bent and falling out into IR; combined with hip flexor stretch      Knee/Hip Exercises: Seated   Ball Squeeze  Performed ball squeeze during sit <> stand from mat x 5 reps to promote hip IR      Ankle Exercises: Stretches   Soleus Stretch  5 reps;20 seconds   see below   Gastroc Stretch  30 seconds;3 reps    Gastroc Stretch Limitations  standing with forefoot on step, dropping heel down and keeping hip in neutral    Other Stretch  Soleus stretch: placed R foot on wedge slanted into DF while performing sustained squats with therapist facilitating weight shift to R for increased WB through R heel and then lifting L heel off floor for further use of WB to increase closed chain ankle DF ROM             PT Education - 06/01/19 2054    Education provided  Yes    Education Details  faulty mechanics on elliptical, added hip ER stretches and hip IR strengthening, will contact [facility] about what is needed from physician for AFO and night splint    Person(s) Educated  Patient    Methods  Explanation;Demonstration;Handout    Comprehension  Verbalized understanding;Returned demonstration       PT Short Term Goals - 05/18/19 1213      PT SHORT TERM GOAL #1   Title  Patient will demonstrate independence with updated  stretching and strengthening HEP    Time  4    Period  Weeks    Status  On-going    Target Date  06/09/19      PT SHORT TERM GOAL #2   Title  Pt will participate in further assessment of falls risk during gait with gait velocity and FGA    Time  4    Period  Weeks    Status  Achieved    Target Date  06/09/19      PT SHORT TERM GOAL #3   Title  Pt will participate in assessment for new and more appropriate AFO    Time  4    Period  Weeks    Status  Achieved    Target Date  06/09/19      PT SHORT TERM GOAL #4   Title  Pt will tolerate initial set up and fitting of functional electrical stimulation and will begin ROM and gait training with Bioness on RLE (if cleared medically by physician)    Time  4    Period  Weeks    Status  Achieved    Target Date  06/09/19      PT SHORT TERM GOAL #5   Title  Pt will participate in assessment of RLE mechanics when performing exercise on elliptical    Time  4    Period  Weeks    Status  New    Target Date  06/09/19        PT Long Term Goals - 05/18/19 1214      PT LONG TERM GOAL #1   Title  Pt will demonstrate independence with final HEP and improved RLE mechanics on elliptical when using for aerobic exercise (07/09/19)    Time  8    Period  Weeks    Status  New      PT LONG TERM GOAL #2   Title  Pt will demonstrate 3 point improvement in DGI to indicate decreased falls risk    Baseline  19/24    Time  8    Period  Weeks    Status  Revised      PT LONG TERM GOAL #3   Title  Pt will demonstrate improvement in gait velocity with new AFO to >/= 3.6 ft/sec    Baseline  3.19 with Bioness    Time  8    Period  Weeks    Status  Revised  PT LONG TERM GOAL #4   Title  Pt will demonstrate 5 deg improvement in passive or active R ankle eversion and 20 degree improvement in ankle DF to improve gait mechanics    Baseline  10 deg inversion, lacking 45 deg to neutral DF    Time  8    Period  Weeks    Status  New      PT LONG TERM  GOAL #5   Title  Pt will demonstrate ability to don new AFO and will tolerate full day of wear    Time  8    Period  Weeks    Status  New            Plan - 06/01/19 2056    Clinical Impression Statement  Performed assessment of mechanics on elliptical and stretches with new shoes with decreased medial support and without current AFO.  Decreased cues required for ankle position during stretches and elliptical but increased cues required to attend to rotation of R hip.  Rest of session focused on initiating hip ER and hip flexor stretches and will continue to address with hip IR strengthening.    Personal Factors and Comorbidities  Comorbidity 2;Time since onset of injury/illness/exacerbation;Age    Comorbidities  L CVA with ICH, R hemiplegia, contracture of R ankle muscles, R shoulder adhesive capsulitis, multiple falls with injury, seizures, depression and anxiety, pt is a mother to young children    Examination-Activity Limitations  Locomotion Level;Squat;Stairs;Stand;Caring for Others    Examination-Participation Restrictions  Cleaning;Shop;Community Activity    Stability/Clinical Decision Making  Evolving/Moderate complexity    Rehab Potential  Good    Clinical Impairments Affecting Rehab Potential  time since onset, mm contracture in R ankle    PT Frequency  2x / week    PT Duration  8 weeks    PT Treatment/Interventions  ADLs/Self Care Home Management;Electrical Stimulation;Gait training;Stair training;Functional mobility training;Therapeutic activities;Therapeutic exercise;Balance training;Neuromuscular re-education;Patient/family education;Orthotic Fit/Training;Passive range of motion;Dry needling;Taping    PT Next Visit Plan  return to Bioness and use with elliptical; continue intense stretching program for RLE - gastroc, posterior tib, hamstring, hip ER, hip flexor.  RLE strengthening - RLE PNF patterns into IR/extension; especially hip EXT and ABD/IR  Balance training -  rockerboard. Stair Industrial/product designer with Plan of Care  Patient       Patient will benefit from skilled therapeutic intervention in order to improve the following deficits and impairments:  Abnormal gait, Decreased balance, Decreased mobility, Decreased range of motion, Decreased strength, Difficulty walking, Hypomobility, Increased muscle spasms, Impaired flexibility, Impaired tone, Impaired sensation  Visit Diagnosis: 1. Unsteadiness on feet   2. Spastic hemiplegia of right dominant side as late effect of other cerebrovascular disease ([place])   3. Muscle weakness (generalized)   4. Repeated falls   5. Stiffness of right ankle, not elsewhere classified        Problem List Patient Active Problem List   Diagnosis Date Noted  . Depression 03/23/2017  . Anxiety 03/23/2017  . Muscle weakness 02/03/2017  . History of hemorrhagic stroke with residual hemiparesis ([place]) 12/10/2016  . Spastic hemiplegia and hemiparesis affecting dominant side ([place]) 09/13/2015  . Adhesive capsulitis of right shoulder 08/24/2015  . Localization-related symptomatic epilepsy and epileptic syndromes with simple partial seizures, not intractable, without status epilepticus ([place]) 08/21/2015  . Nontraumatic cortical hemorrhage of cerebral hemisphere ([place])   . Seizure ([place])   . Seizures ([place])   . Adjustment disorder  with depressed mood   . Contracture of muscle ankle and foot 06/11/2015  . Hemiplga fol ntrm intcrbl hemor aff right dominant side ([place]) 06/06/2015  . Left-sided intracerebral hemorrhage ([place]) 06/04/2015  . Seizure disorder as sequela of cerebrovascular accident ([place])   . Seizure disorder ([place]) 06/03/2015  . Sepsis ([place]) 06/03/2015  . UTI (urinary tract infection) 06/03/2015  . Hypotension 06/02/2015  . Right spastic hemiparesis ([place]) 05/28/2015  . Aphasia following nontraumatic intracerebral hemorrhage 05/28/2015  . History of anxiety disorder 05/28/2015  . ICH (intracerebral  hemorrhage) ([place])   . Seizure disorder, nonconvulsive, with status epilepticus ([place])   . Cerebral venous thrombosis of cortical vein   . Cytotoxic cerebral edema ([place])   . IVH (intraventricular hemorrhage) ([place])   . Term pregnancy 05/07/2015  . Spontaneous vaginal delivery 05/07/2015    [name], PT, DPT 06/01/19    9:02 PM    [facility] [address] [place], [place], [address] Phone: [phone]   Fax:  [phone]  Name: Marianny Goris MRN: [id number] Date of Birth: [birth year]

## 2019-06-01 NOTE — Patient Instructions (Addendum)
Gastroc / Heel Cord Stretch - On Step    Stand with heels over edge of stair. Holding rail, lower heels until stretch is felt in calf of legs. Repeat _3__ times. Do _2__ times per day.   Step-Down: Forward    Stand on first step and lower left leg as far as possible to floor until you feel a stretch in your right calf.  Try to keep your knee pointing forwards as much as you can.  Hold for 30 seconds and then rest. Perform 5 repetitions, 2 times a day.    Alternating Hamstring Curl    Stand on both legs, one hand touching the counter top.  Inhaling, shift weight onto left leg. Exhaling, bend right leg, heel toward buttock then inhale and placing right leg down, shift weight onto right leg. Repeat with other leg. Repeat _10__ times, alternating legs. Do _2__ times per day.  Focus on keeping your posture up tall and don't let the knee drift too far forwards.   Squat    Keeping feet flat on floor shoulder width apart, trunk straight, squat bending knees 30. Hold position __10__ seconds. Repeat __10__ times per session. Do __2  sessions per day.  Copyright  VHI. All rights reserved.     Hip Internal Rotation (Eccentric) - Prone, Knees Flexed to 90 Degrees    Lie on stomach with Right knee flexed to 90. Have a belt around your foot and pulling on outside of foot.  Slowly lower legs outward and heel towards buttocks x 30 seconds. Relax and then perform again. _4__ reps per set, __2_ sets per day, __7_ days per week.   Outer Hip Stretch: Reclined IT Band Stretch (Strap)    Strap around opposite foot, pull across only as far as possible with shoulders on mat. Hold for __10__ breaths. Repeat ___2_ times  Copyright  VHI. All rights reserved.

## 2019-06-03 ENCOUNTER — Ambulatory Visit: Payer: Medicare Other | Admitting: Physical Therapy

## 2019-06-03 ENCOUNTER — Encounter: Payer: Self-pay | Admitting: Physical Therapy

## 2019-06-03 ENCOUNTER — Other Ambulatory Visit: Payer: Self-pay

## 2019-06-03 DIAGNOSIS — M25671 Stiffness of right ankle, not elsewhere classified: Secondary | ICD-10-CM | POA: Diagnosis not present

## 2019-06-03 DIAGNOSIS — M25611 Stiffness of right shoulder, not elsewhere classified: Secondary | ICD-10-CM | POA: Diagnosis not present

## 2019-06-03 DIAGNOSIS — M6281 Muscle weakness (generalized): Secondary | ICD-10-CM | POA: Diagnosis not present

## 2019-06-03 DIAGNOSIS — I69851 Hemiplegia and hemiparesis following other cerebrovascular disease affecting right dominant side: Secondary | ICD-10-CM

## 2019-06-03 DIAGNOSIS — R296 Repeated falls: Secondary | ICD-10-CM | POA: Diagnosis not present

## 2019-06-03 DIAGNOSIS — R2681 Unsteadiness on feet: Secondary | ICD-10-CM | POA: Diagnosis not present

## 2019-06-03 DIAGNOSIS — R293 Abnormal posture: Secondary | ICD-10-CM | POA: Diagnosis not present

## 2019-06-03 NOTE — Therapy (Signed)
[facility] [address] [place], [place], [address] Phone: [phone]   Fax:  [phone]  Physical Therapy Treatment  Patient Details  Name: Ashley [name] MRN: [id number] Date of Birth: 05-Jul-1976 Referring Provider (PT): Marton Redwood, MD   Encounter Date: 06/03/2019  PT End of Session - 06/03/19 [id number]    Visit Number  6    Number of Visits  17    Date for PT Re-Evaluation  07/09/19    Authorization Type  Medicare and [place] - 10th visit PN required.  30 VL for OT; 30 VL for PT - 3 used    Authorization - Visit Number  9    Authorization - Number of Visits  30    PT Start Time  1020    PT Stop Time  1100    PT Time Calculation (min)  40 min    Equipment Utilized During Treatment  Other (comment)   bioness   Activity Tolerance  Patient tolerated treatment well    Behavior During Therapy  WFL for tasks assessed/performed       Past Medical History:  Diagnosis Date  . Anxiety   . Depression   . ICH (intracerebral hemorrhage) ([place])   . Seizures ([place])   . Stroke [facility])     Past Surgical History:  Procedure Laterality Date  . DILATION AND CURETTAGE OF UTERUS      There were no vitals filed for this visit.  Subjective Assessment - 06/03/19 1024    Subjective  Had a good aquatic session.  Has been performing stretches at home for hip IR.    Pertinent History  : L CVA with ICH, R hemiplegia, contracture of R ankle muscles, R shoulder adhesive capsulitis, multiple falls with injury, seizures, depression and anxiety    Limitations  Standing;Walking    Patient Stated Goals  More stability, prevent falls, would like to be able to run    Currently in Pain?  No/denies                       [facility] Adult PT Treatment/Exercise - 06/03/19 1026      Ambulation/Gait   Ambulation/Gait  Yes    Ambulation/Gait Assistance  5: Supervision    Ambulation/Gait Assistance Details  with Bioness in gait mode with  therapist providing facilitation for proximal stability and increased weight shift to R with increased stance time on RLE    Ambulation Distance (Feet)  230 Feet    Assistive device  None    Ambulation Surface  Level;Indoor      Knee/Hip Exercises: Aerobic   Elliptical  x 6 minutes with Bioness in gait mode with therapist providing facilitation for trunk excursion to L and pelvic weight shift to R and anterior pelvic rotation on R to maintain R hip IR and cues to push through R heel to increase hamstring activation.      Knee/Hip Exercises: Seated   Sit to Sand  10 reps;with UE support;without UE support   with Bioness in training mode     Knee/Hip Exercises: Supine   Other Supine Knee/Hip Exercises  Sit to stand as above with Bioness in training mode from chair first with UE on mat in front of patient for stability and facilitate greater weight shift forwards over BOS and then progressing to no UE support but still with therapist assisting on RLE to translate tibia forwards over R foot to bring COG forwards prior to standing.  Pt demonstrating improve DF ROM to be able to perform.  Still requires assistance to control stand > sit and to maintain weight shift forwards over feet      Modalities   Modalities  Electrical Stimulation      Electrical Stimulation   Electrical Stimulation Location  R anterior tibialis and hamstring mm groups    Electrical Stimulation Action  open and closed chain ankle DF, knee flexion and hip extension    Electrical Stimulation Parameters  See tablet 1 for parameters; steering electrode    Electrical Stimulation Goals  Strength;Tone;Neuromuscular facilitation             PT Education - 06/03/19 1243    Education provided  Yes    Education Details  elliptical training    Person(s) Educated  Patient    Methods  Explanation;Demonstration    Comprehension  Verbalized understanding;Returned demonstration       PT Short Term Goals - 05/18/19 1213       PT SHORT TERM GOAL #1   Title  Patient will demonstrate independence with updated stretching and strengthening HEP    Time  4    Period  Weeks    Status  On-going    Target Date  06/09/19      PT SHORT TERM GOAL #2   Title  Pt will participate in further assessment of falls risk during gait with gait velocity and FGA    Time  4    Period  Weeks    Status  Achieved    Target Date  06/09/19      PT SHORT TERM GOAL #3   Title  Pt will participate in assessment for new and more appropriate AFO    Time  4    Period  Weeks    Status  Achieved    Target Date  06/09/19      PT SHORT TERM GOAL #4   Title  Pt will tolerate initial set up and fitting of functional electrical stimulation and will begin ROM and gait training with Bioness on RLE (if cleared medically by physician)    Time  4    Period  Weeks    Status  Achieved    Target Date  06/09/19      PT SHORT TERM GOAL #5   Title  Pt will participate in assessment of RLE mechanics when performing exercise on elliptical    Time  4    Period  Weeks    Status  New    Target Date  06/09/19        PT Long Term Goals - 05/18/19 1214      PT LONG TERM GOAL #1   Title  Pt will demonstrate independence with final HEP and improved RLE mechanics on elliptical when using for aerobic exercise (07/09/19)    Time  8    Period  Weeks    Status  New      PT LONG TERM GOAL #2   Title  Pt will demonstrate 3 point improvement in DGI to indicate decreased falls risk    Baseline  19/24    Time  8    Period  Weeks    Status  Revised      PT LONG TERM GOAL #3   Title  Pt will demonstrate improvement in gait velocity with new AFO to >/= 3.6 ft/sec    Baseline  3.19 with Bioness    Time  8  Period  Weeks    Status  Revised      PT LONG TERM GOAL #4   Title  Pt will demonstrate 5 deg improvement in passive or active R ankle eversion and 20 degree improvement in ankle DF to improve gait mechanics    Baseline  10 deg inversion, lacking 45  deg to neutral DF    Time  8    Period  Weeks    Status  New      PT LONG TERM GOAL #5   Title  Pt will demonstrate ability to don new AFO and will tolerate full day of wear    Time  8    Period  Weeks    Status  New            Plan - 06/03/19 1243    Clinical Impression Statement  With Bioness continued to focus on gait mechanics and mechanics when using the elliptical.  Continues to require assistance to fully weight shift over to RLE but is gaining greater R ankle ROM to allow pt to shift forwards over BOS during sit > stand and during stance phase of gait.  Will continue to address and progress towards goals.    Personal Factors and Comorbidities  Comorbidity 2;Time since onset of injury/illness/exacerbation;Age    Comorbidities  L CVA with ICH, R hemiplegia, contracture of R ankle muscles, R shoulder adhesive capsulitis, multiple falls with injury, seizures, depression and anxiety, pt is a mother to young children    Examination-Activity Limitations  Locomotion Level;Squat;Stairs;Stand;Caring for Others    Examination-Participation Restrictions  Cleaning;Shop;Community Activity    Stability/Clinical Decision Making  Evolving/Moderate complexity    Rehab Potential  Good    Clinical Impairments Affecting Rehab Potential  time since onset, mm contracture in R ankle    PT Frequency  2x / week    PT Duration  8 weeks    PT Treatment/Interventions  ADLs/Self Care Home Management;Electrical Stimulation;Gait training;Stair training;Functional mobility training;Therapeutic activities;Therapeutic exercise;Balance training;Neuromuscular re-education;Patient/family education;Orthotic Fit/Training;Passive range of motion;Dry needling;Taping    PT Next Visit Plan  STG by end of next week.  Bioness and use with elliptical; sit > stand bringing weight forwards over foot; lunges > shift forwards to stand; continue intense stretching program for RLE - gastroc, posterior tib, hamstring, hip ER, hip  flexor.  RLE strengthening - RLE PNF patterns into IR/extension; especially hip EXT and ABD/IR  Balance training - rockerboard. Stair Industrial/product designer with Plan of Care  Patient       Patient will benefit from skilled therapeutic intervention in order to improve the following deficits and impairments:  Abnormal gait, Decreased balance, Decreased mobility, Decreased range of motion, Decreased strength, Difficulty walking, Hypomobility, Increased muscle spasms, Impaired flexibility, Impaired tone, Impaired sensation  Visit Diagnosis: Unsteadiness on feet  Spastic hemiplegia of right dominant side as late effect of other cerebrovascular disease (HCC)  Muscle weakness (generalized)  Repeated falls  Stiffness of right ankle, not elsewhere classified     Problem List Patient Active Problem List   Diagnosis Date Noted  . Depression 03/23/2017  . Anxiety 03/23/2017  . Muscle weakness 02/03/2017  . History of hemorrhagic stroke with residual hemiparesis ([place]) 12/10/2016  . Spastic hemiplegia and hemiparesis affecting dominant side ([place]) 09/13/2015  . Adhesive capsulitis of right shoulder 08/24/2015  . Localization-related symptomatic epilepsy and epileptic syndromes with simple partial seizures, not intractable, without status epilepticus ([place]) 08/21/2015  . Nontraumatic cortical hemorrhage of  cerebral hemisphere ([place])   . Seizure ([place])   . Seizures ([place])   . Adjustment disorder with depressed mood   . Contracture of muscle ankle and foot 06/11/2015  . Hemiplga fol ntrm intcrbl hemor aff right dominant side ([place]) 06/06/2015  . Left-sided intracerebral hemorrhage ([place]) 06/04/2015  . Seizure disorder as sequela of cerebrovascular accident ([place])   . Seizure disorder ([place]) 06/03/2015  . Sepsis ([place]) 06/03/2015  . UTI (urinary tract infection) 06/03/2015  . Hypotension 06/02/2015  . Right spastic hemiparesis ([place]) 05/28/2015  . Aphasia following nontraumatic  intracerebral hemorrhage 05/28/2015  . History of anxiety disorder 05/28/2015  . ICH (intracerebral hemorrhage) ([place])   . Seizure disorder, nonconvulsive, with status epilepticus ([place])   . Cerebral venous thrombosis of cortical vein   . Cytotoxic cerebral edema ([place])   . IVH (intraventricular hemorrhage) ([place])   . Term pregnancy 05/07/2015  . Spontaneous vaginal delivery 05/07/2015    [name], PT, DPT 06/03/19    12:47 PM    [facility] [address] [place], [place], [address] Phone: [phone]   Fax:  [phone]  Name: Chantilly Bednarek MRN: [id number] Date of Birth: 1975/12/06

## 2019-06-07 DIAGNOSIS — F3342 Major depressive disorder, recurrent, in full remission: Secondary | ICD-10-CM | POA: Diagnosis not present

## 2019-06-08 ENCOUNTER — Other Ambulatory Visit: Payer: Self-pay

## 2019-06-08 ENCOUNTER — Encounter: Payer: Self-pay | Admitting: Physical Therapy

## 2019-06-08 ENCOUNTER — Ambulatory Visit: Payer: Medicare Other | Admitting: Occupational Therapy

## 2019-06-08 ENCOUNTER — Encounter: Payer: Self-pay | Admitting: Occupational Therapy

## 2019-06-08 ENCOUNTER — Ambulatory Visit: Payer: Medicare Other | Admitting: Physical Therapy

## 2019-06-08 DIAGNOSIS — R2681 Unsteadiness on feet: Secondary | ICD-10-CM

## 2019-06-08 DIAGNOSIS — R296 Repeated falls: Secondary | ICD-10-CM

## 2019-06-08 DIAGNOSIS — I69851 Hemiplegia and hemiparesis following other cerebrovascular disease affecting right dominant side: Secondary | ICD-10-CM | POA: Diagnosis not present

## 2019-06-08 DIAGNOSIS — M6281 Muscle weakness (generalized): Secondary | ICD-10-CM | POA: Diagnosis not present

## 2019-06-08 DIAGNOSIS — R293 Abnormal posture: Secondary | ICD-10-CM | POA: Diagnosis not present

## 2019-06-08 DIAGNOSIS — M25671 Stiffness of right ankle, not elsewhere classified: Secondary | ICD-10-CM | POA: Diagnosis not present

## 2019-06-08 DIAGNOSIS — M25611 Stiffness of right shoulder, not elsewhere classified: Secondary | ICD-10-CM

## 2019-06-08 NOTE — Therapy (Signed)
[facility] [address] [place], [place], [address] Phone: [phone]   Fax:  [phone]  Physical Therapy Treatment  Patient Details  Name: Ashley [name] MRN: [id number] Date of Birth: 03-Apr-1976 Referring Provider (PT): Marton Redwood, MD   Encounter Date: 06/08/2019  PT End of Session - 06/08/19 1603    Visit Number  7    Number of Visits  17    Date for PT Re-Evaluation  07/09/19    Authorization Type  Medicare and [place] - 10th visit PN required.  30 VL for OT; 30 VL for PT - 3 used    Authorization - Visit Number  10    Authorization - Number of Visits  30    PT Start Time  [phone]    PT Stop Time  1020    PT Time Calculation (min)  44 min    Equipment Utilized During Treatment  Other (comment)   bioness   Activity Tolerance  Patient tolerated treatment well    Behavior During Therapy  WFL for tasks assessed/performed       Past Medical History:  Diagnosis Date  . Anxiety   . Depression   . ICH (intracerebral hemorrhage) ([place])   . Seizures ([place])   . Stroke [facility])     Past Surgical History:  Procedure Laterality Date  . DILATION AND CURETTAGE OF UTERUS      There were no vitals filed for this visit.  Subjective Assessment - 06/08/19 0940    Subjective  Orthotist will be bringing new AFO and night splint on Friday.  Heel is staying down on elliptical better now.    Pertinent History  : L CVA with ICH, R hemiplegia, contracture of R ankle muscles, R shoulder adhesive capsulitis, multiple falls with injury, seizures, depression and anxiety    Limitations  Standing;Walking    Patient Stated Goals  More stability, prevent falls, would like to be able to run    Currently in Pain?  No/denies                       [facility] Adult PT Treatment/Exercise - 06/08/19 1551      Ambulation/Gait   Ambulation/Gait  Yes    Ambulation/Gait Assistance  5: Supervision    Ambulation/Gait Assistance Details   Bioness in gait mode observing gait: pt continues to utilize R hip hike and hip ER to advance RLE    Ambulation Distance (Feet)  115 Feet    Assistive device  None    Ambulation Surface  Level;Indoor      Neuro Re-ed    Neuro Re-ed Details   NMR in bars with Bioness in training mode first performing in staggered stance pelvic anterior rotation and anterior weight shift over front stance LE against resistance of theraband pulling back against ASIS; performed on R and L side.  Progressed from UE support to no UE support.  Added in contralateral LE stepping forwards with UE support and then without UE support.  When shifting over RLE pt required facilitation from therapist to bring hip fully over RLE and advance tibia over foot.  When performing swing with RLE pt required facilitation from therapist for initiation of swing with hip and knee flexion and full weight shift laterally to LLE.        Knee/Hip Exercises: Standing   Wall Squat  2 sets;5 reps;10 seconds    Wall Squat Limitations  with Bioness in training mode with  first set holding with both heels on floor, progressed to lifting L heel off floor for increased WB through RLE      Modalities   Modalities  Electrical Stimulation      Electrical Stimulation   Electrical Stimulation Location  R anterior tibialis and hamstring mm groups    Electrical Stimulation Action  open and closed chain ankle DF, knee flexion and hip extension    Electrical Stimulation Parameters  See tablet 1 for parameters; steering electrode    Electrical Stimulation Goals  Strength;Tone;Neuromuscular facilitation             PT Education - 06/08/19 1558    Education provided  Yes    Education Details  will receive AFO on Friday; pt to add wall squats to HEP    Person(s) Educated  Patient    Methods  Explanation;Demonstration    Comprehension  Verbalized understanding;Returned demonstration       PT Short Term Goals - 06/08/19 1551      PT SHORT TERM  GOAL #1   Title  Patient will demonstrate independence with updated stretching and strengthening HEP    Time  4    Period  Weeks    Status  Achieved    Target Date  06/09/19      PT SHORT TERM GOAL #2   Title  Pt will participate in further assessment of falls risk during gait with gait velocity and FGA    Time  4    Period  Weeks    Status  Achieved    Target Date  06/09/19      PT SHORT TERM GOAL #3   Title  Pt will participate in assessment for new and more appropriate AFO    Time  4    Period  Weeks    Status  Achieved    Target Date  06/09/19      PT SHORT TERM GOAL #4   Title  Pt will tolerate initial set up and fitting of functional electrical stimulation and will begin ROM and gait training with Bioness on RLE (if cleared medically by physician)    Time  4    Period  Weeks    Status  Achieved    Target Date  06/09/19      PT SHORT TERM GOAL #5   Title  Pt will participate in assessment of RLE mechanics when performing exercise on elliptical    Time  4    Period  Weeks    Status  Achieved    Target Date  06/09/19        PT Long Term Goals - 05/18/19 1214      PT LONG TERM GOAL #1   Title  Pt will demonstrate independence with final HEP and improved RLE mechanics on elliptical when using for aerobic exercise (07/09/19)    Time  8    Period  Weeks    Status  New      PT LONG TERM GOAL #2   Title  Pt will demonstrate 3 point improvement in DGI to indicate decreased falls risk    Baseline  19/24    Time  8    Period  Weeks    Status  Revised      PT LONG TERM GOAL #3   Title  Pt will demonstrate improvement in gait velocity with new AFO to >/= 3.6 ft/sec    Baseline  3.19 with Bioness    Time  8    Period  Weeks    Status  Revised      PT LONG TERM GOAL #4   Title  Pt will demonstrate 5 deg improvement in passive or active R ankle eversion and 20 degree improvement in ankle DF to improve gait mechanics    Baseline  10 deg inversion, lacking 45 deg to  neutral DF    Time  8    Period  Weeks    Status  New      PT LONG TERM GOAL #5   Title  Pt will demonstrate ability to don new AFO and will tolerate full day of wear    Time  8    Period  Weeks    Status  New            Plan - 06/08/19 1604    Clinical Impression Statement  Progressed squats with Bioness in training mode to wall squats with yoga block squeeze and continued to utilize Bioness in gait mode for re-education of gait sequencing and mechanics and decreasing use of compensatory strategies.  Will begin training with new AFO next session.    Personal Factors and Comorbidities  Comorbidity 2;Time since onset of injury/illness/exacerbation;Age    Comorbidities  L CVA with ICH, R hemiplegia, contracture of R ankle muscles, R shoulder adhesive capsulitis, multiple falls with injury, seizures, depression and anxiety, pt is a mother to young children    Examination-Activity Limitations  Locomotion Level;Squat;Stairs;Stand;Caring for Others    Examination-Participation Restrictions  Cleaning;Shop;Community Activity    Stability/Clinical Decision Making  Evolving/Moderate complexity    Rehab Potential  Good    Clinical Impairments Affecting Rehab Potential  time since onset, mm contracture in R ankle    PT Frequency  2x / week    PT Duration  8 weeks    PT Treatment/Interventions  ADLs/Self Care Home Management;Electrical Stimulation;Gait training;Stair training;Functional mobility training;Therapeutic activities;Therapeutic exercise;Balance training;Neuromuscular re-education;Patient/family education;Orthotic Fit/Training;Passive range of motion;Dry needling;Taping    PT Next Visit Plan  Training with new AFO. Bioness and use with elliptical; sit > stand bringing weight forwards over foot; lunges > shift forwards to stand; closed chain hip ABD strengthening, continue intense stretching program for RLE - gastroc, posterior tib, hamstring, hip ER, hip flexor.  RLE strengthening - RLE  PNF patterns into IR/extension; especially hip EXT and ABD/IR  Balance training - rockerboard. Stair Industrial/product designer with Plan of Care  Patient       Patient will benefit from skilled therapeutic intervention in order to improve the following deficits and impairments:  Abnormal gait, Decreased balance, Decreased mobility, Decreased range of motion, Decreased strength, Difficulty walking, Hypomobility, Increased muscle spasms, Impaired flexibility, Impaired tone, Impaired sensation  Visit Diagnosis: Unsteadiness on feet  Spastic hemiplegia of right dominant side as late effect of other cerebrovascular disease (HCC)  Muscle weakness (generalized)  Repeated falls  Stiffness of right ankle, not elsewhere classified     Problem List Patient Active Problem List   Diagnosis Date Noted  . Depression 03/23/2017  . Anxiety 03/23/2017  . Muscle weakness 02/03/2017  . History of hemorrhagic stroke with residual hemiparesis ([place]) 12/10/2016  . Spastic hemiplegia and hemiparesis affecting dominant side ([place]) 09/13/2015  . Adhesive capsulitis of right shoulder 08/24/2015  . Localization-related symptomatic epilepsy and epileptic syndromes with simple partial seizures, not intractable, without status epilepticus ([place]) 08/21/2015  . Nontraumatic cortical hemorrhage of cerebral hemisphere ([employer])   . Seizure ([place])   .  Seizures ([place])   . Adjustment disorder with depressed mood   . Contracture of muscle ankle and foot 06/11/2015  . Hemiplga fol ntrm intcrbl hemor aff right dominant side ([place]) 06/06/2015  . Left-sided intracerebral hemorrhage ([place]) 06/04/2015  . Seizure disorder as sequela of cerebrovascular accident ([place])   . Seizure disorder ([place]) 06/03/2015  . Sepsis ([place]) 06/03/2015  . UTI (urinary tract infection) 06/03/2015  . Hypotension 06/02/2015  . Right spastic hemiparesis ([place]) 05/28/2015  . Aphasia following nontraumatic intracerebral hemorrhage 05/28/2015  .  History of anxiety disorder 05/28/2015  . ICH (intracerebral hemorrhage) ([place])   . Seizure disorder, nonconvulsive, with status epilepticus ([place])   . Cerebral venous thrombosis of cortical vein   . Cytotoxic cerebral edema ([place])   . IVH (intraventricular hemorrhage) ([place])   . Term pregnancy 05/07/2015  . Spontaneous vaginal delivery 05/07/2015    [name], PT, DPT 06/08/19    4:40 PM    [facility] [address] [place], [place], [address] Phone: [phone]   Fax:  [phone]  Name: Sumaya Gorter MRN: [id number] Date of Birth: 1975/11/30

## 2019-06-08 NOTE — Therapy (Addendum)
[facility] [address] [place], [place], [address] Phone: [phone]   Fax:  [phone]  Occupational Therapy Treatment  Patient Details  Name: Ashley [name] MRN: [id number] Date of Birth: 1976-03-18 Referring Provider (OT): Dr Marton Redwood   Encounter Date: 06/08/2019  OT End of Session - 06/08/19 1737    Visit Number  10    Number of Visits  16    Date for OT Re-Evaluation  07/20/19    Authorization Type  medicare with [employer] - will need PN every 10th visit    Authorization - Visit Number  10    Authorization - Number of Visits  20    OT Start Time  1331    OT Stop Time  1415    OT Time Calculation (min)  44 min    Activity Tolerance  Patient tolerated treatment well       Past Medical History:  Diagnosis Date  . Anxiety   . Depression   . ICH (intracerebral hemorrhage) ([place])   . Seizures ([place])   . Stroke [facility])     Past Surgical History:  Procedure Laterality Date  . DILATION AND CURETTAGE OF UTERUS      There were no vitals filed for this visit.  Subjective Assessment - 06/08/19 1733    Subjective   I have been practicing getting up from low surfaces    Pertinent History  see epic snapshot; pt with SAH 2 weeks after deliverying her 3rd child    Patient Stated Goals  strength of arm and get my R leg working better    Currently in Pain?  Yes    Pain Score  6     Pain Location  Hip    Pain Orientation  Right    Pain Descriptors / Indicators  Sore;Aching;Tender;Tightness    Pain Type  Acute pain    Pain Onset  Today    Pain Frequency  Constant    Aggravating Factors   I think its from a new stretch that PT gave me to do at home    Pain Relieving Factors  just started so I don't know    Multiple Pain Sites  No         Patient seen for aquatic therapy today.  Treatment took place in water 2.5-4 feet deep depending upon activity.  Pt entered the pool via steps and 2 hand rails with distant  supervision.  Initially focused on R ankle ROM using aquastretch techniques as well as weight bearing over RLE wearing air cast to assist with alignment. Also utilized sit to stand (multiple reps) from low surface (second step of pool) to address ROM as well as transitional movements and using RLE to "power up" into standing. Pt needs max cues and CGA however able to do without UE support.  Pt stated she had significant pain in R hip (anterior radiating into groin) and rated pain at 6/10. Pt stated she thought it was likely from a new stretch that PT added to her HEP.  Utilized aquastretch techniques focused on decreasing pain as well as increasing ROM of R hip for extension and abduction. Pt tolerated well and then reported pain at 2/10.  Encouraged pt to review stretch with PT and to ensure she was not being too aggressive with stretch. Pt verbalized agreement.  Treatment also focused on functional ambulation with emphasis on postural alignment and control using light dumb bells - pt able to use dumb bells  in the water to provide feedback regarding trunk alignment given pt's sensory impairments.  Pt only needed intermittent cues today and no physical facilitation.  Addressed pt's ability to transition from standing to supine in the water - for first time pt able to complete this after practice and repetition without floatation device. Pt also able to float supine and maintain core alignment without use of UE's.  Pt then also able to maintain core alignment while using UE's to pull body weight length of pool x2 today without flotation devices.  Pt requires min a to transition from supine to standing.  Pt exited the pool via steps using 2 hand rails and mod I.                       OT Short Term Goals - 06/08/19 1735      OT SHORT TERM GOAL #1   Title  Pt will demonstrate improved overhead reach to at least 130* of shoulder flexion RUE  to assist with heavier overhead home mgmt tasks  -7/8//2020 (goal date adjusted to meet 4 visits)    Status  Achieved      OT SHORT TERM GOAL #2   Title  Pt will demonstrate improved RUE strength /core strength to assist with lifting heavy items in grocery store with greater ease (per pt report)    Status  Achieved      OT SHORT TERM GOAL #3   Title  Pt will demonstrate ability to lift and carry a 5 pound object in the grocery store with her RUE with no LOB - 06/22/2019 (date adjusted due to hold first two weeks in August due to scheduling)        OT Long Term Goals - 06/08/19 1735      OT LONG TERM GOAL #1   Title  Pt will be mod I with upgraded HEP to include aquatic based HEP - 07/20/2019    Status  On-going      OT LONG TERM GOAL #2   Title  Pt will demonstrate improved postural alignment and control to reduce falls (has been falling at least 1x/month)    Status  On-going            Plan - 06/08/19 1735    Clinical Impression Statement  Pt started session with 6/10 anterior hip pain radiating into groin. By end of session pt reported 2/6.  Pt progressing toward goals.    OT Occupational Profile and History  Problem Focused Assessment - Including review of records relating to presenting problem    Occupational performance deficits (Please refer to evaluation for details):  IADL's;Leisure;Social Participation    Body Structure / Function / Physical Skills  Balance;Mobility;UE functional use;Strength;IADL;Tone;ROM;GMC    Rehab Potential  Good    Clinical Decision Making  Limited treatment options, no task modification necessary    Comorbidities Affecting Occupational Performance:  May have comorbidities impacting occupational performance    Modification or Assistance to Complete Evaluation   No modification of tasks or assist necessary to complete eval    OT Frequency  1x / week    OT Duration  8 weeks    OT Treatment/Interventions  Aquatic Therapy;Self-care/ADL training;Neuromuscular education;Therapeutic exercise;Functional  Mobility Training;Balance training    Plan  aquatic therapy focused on NMR for RUE functional use, postural alignment and control, RLE strengtening and balance.     Consulted and Agree with Plan of Care  Patient  Patient will benefit from skilled therapeutic intervention in order to improve the following deficits and impairments:   Body Structure / Function / Physical Skills: Balance, Mobility, UE functional use, Strength, IADL, Tone, ROM, GMC       Visit Diagnosis: Unsteadiness on feet  Spastic hemiplegia of right dominant side as late effect of other cerebrovascular disease (HCC)  Muscle weakness (generalized)  Abnormal posture  Stiffness of right shoulder, not elsewhere classified    Problem List Patient Active Problem List   Diagnosis Date Noted  . Depression 03/23/2017  . Anxiety 03/23/2017  . Muscle weakness 02/03/2017  . History of hemorrhagic stroke with residual hemiparesis ([place]) 12/10/2016  . Spastic hemiplegia and hemiparesis affecting dominant side ([place]) 09/13/2015  . Adhesive capsulitis of right shoulder 08/24/2015  . Localization-related symptomatic epilepsy and epileptic syndromes with simple partial seizures, not intractable, without status epilepticus ([place]) 08/21/2015  . Nontraumatic cortical hemorrhage of cerebral hemisphere ([place])   . Seizure ([place])   . Seizures ([place])   . Adjustment disorder with depressed mood   . Contracture of muscle ankle and foot 06/11/2015  . Hemiplga fol ntrm intcrbl hemor aff right dominant side ([place]) 06/06/2015  . Left-sided intracerebral hemorrhage ([place]) 06/04/2015  . Seizure disorder as sequela of cerebrovascular accident ([place])   . Seizure disorder ([place]) 06/03/2015  . Sepsis ([place]) 06/03/2015  . UTI (urinary tract infection) 06/03/2015  . Hypotension 06/02/2015  . Right spastic hemiparesis ([place]) 05/28/2015  . Aphasia following nontraumatic intracerebral hemorrhage 05/28/2015  . History of anxiety disorder 05/28/2015  .  ICH (intracerebral hemorrhage) ([place])   . Seizure disorder, nonconvulsive, with status epilepticus ([place])   . Cerebral venous thrombosis of cortical vein   . Cytotoxic cerebral edema ([place])   . IVH (intraventricular hemorrhage) ([place])   . Term pregnancy 05/07/2015  . Spontaneous vaginal delivery 05/07/2015   Occupational Therapy Progress Note  Dates of Reporting Period: 03/09/2019 to 06/08/2019  Objective Reports of Subjective Statement: see above  Objective Measurements: see above  Goal Update: see above  Plan: see above  Reason Skilled Services are Required: see above  Quay Burow, OTR/L 06/08/2019, 5:38 PM  [facility] [address] [place], [place], [address] Phone: [phone]   Fax:  [phone]  Name: Ashley [name] MRN: [id number] Date of Birth: 1976/02/25

## 2019-06-08 NOTE — Patient Instructions (Addendum)
Gastroc / Heel Cord Stretch - On Step    Stand with heels over edge of stair. Holding rail, lower heels until stretch is felt in calf of legs. Repeat _3__ times. Do _2__ times per day.   Step-Down: Forward    Stand on first step and lower left leg as far as possible to floor until you feel a stretch in your right calf.  Try to keep your knee pointing forwards as much as you can.  Hold for 30 seconds and then rest. Perform 5 repetitions, 2 times a day.    Alternating Hamstring Curl    Stand on both legs, one hand touching the counter top.  Inhaling, shift weight onto left leg. Exhaling, bend right leg, heel toward buttock then inhale and placing right leg down, shift weight onto right leg. Repeat with other leg. Repeat _10__ times, alternating legs. Do _2__ times per day.  Focus on keeping your posture up tall and don't let the knee drift too far forwards.   Wall Squat    Feet shoulder width apart, squeezing a yoga block between the knees, step feet forwards - _12___ inches in front of wall, lean against wall. Heels on floor, knees parallel, bend hips and knees to almost 90. Hold _10___ seconds and then return to standing.  Keep thighs squeezing yoga block the whole time. Repeat __6-8__ times. Do __1-2__ sessions per day.   HIP / KNEE: Extension - Modified Sit to Stand    Place hands on chair in front of you. Lean forward, raise hips up from surface - keep knees over feet and pause for a couple of seconds. Straighten hips and knees. Weight bear equally on left and right sides. _10__ reps per set, __2_ sets per day.   Hip Internal Rotation (Eccentric) - Prone, Knees Flexed to 90 Degrees    Lie on stomach with Right knee flexed to 90. Have a belt around your foot and pulling on outside of foot.  Slowly lower legs outward and heel towards buttocks x 30 seconds. Relax and then perform again. _4__ reps per set, __2_ sets per day, __7_ days per week.   Outer Hip Stretch:  Reclined IT Band Stretch (Strap)    Strap around opposite foot, pull across only as far as possible with shoulders on mat. Hold for __10__ breaths. Repeat ___2_ times

## 2019-06-09 DIAGNOSIS — R569 Unspecified convulsions: Secondary | ICD-10-CM | POA: Diagnosis not present

## 2019-06-09 DIAGNOSIS — J181 Lobar pneumonia, unspecified organism: Secondary | ICD-10-CM | POA: Diagnosis not present

## 2019-06-09 DIAGNOSIS — R509 Fever, unspecified: Secondary | ICD-10-CM | POA: Diagnosis not present

## 2019-06-10 ENCOUNTER — Other Ambulatory Visit: Payer: Self-pay

## 2019-06-10 ENCOUNTER — Ambulatory Visit: Payer: Medicare Other | Admitting: Physical Therapy

## 2019-06-10 ENCOUNTER — Encounter: Payer: Self-pay | Admitting: Physical Therapy

## 2019-06-10 DIAGNOSIS — I69851 Hemiplegia and hemiparesis following other cerebrovascular disease affecting right dominant side: Secondary | ICD-10-CM

## 2019-06-10 DIAGNOSIS — M25611 Stiffness of right shoulder, not elsewhere classified: Secondary | ICD-10-CM | POA: Diagnosis not present

## 2019-06-10 DIAGNOSIS — M6281 Muscle weakness (generalized): Secondary | ICD-10-CM

## 2019-06-10 DIAGNOSIS — M25671 Stiffness of right ankle, not elsewhere classified: Secondary | ICD-10-CM | POA: Diagnosis not present

## 2019-06-10 DIAGNOSIS — R2681 Unsteadiness on feet: Secondary | ICD-10-CM | POA: Diagnosis not present

## 2019-06-10 DIAGNOSIS — M21371 Foot drop, right foot: Secondary | ICD-10-CM | POA: Diagnosis not present

## 2019-06-10 DIAGNOSIS — R296 Repeated falls: Secondary | ICD-10-CM | POA: Diagnosis not present

## 2019-06-10 DIAGNOSIS — R293 Abnormal posture: Secondary | ICD-10-CM | POA: Diagnosis not present

## 2019-06-10 NOTE — Therapy (Signed)
[facility] [address] [place], [place], [address] Phone: [phone]   Fax:  [phone]  Physical Therapy Treatment  Patient Details  Name: Ashley [name] MRN: [id number] Date of Birth: [birth year] Referring Provider (PT): Marton Redwood, MD   Encounter Date: 06/10/2019  PT End of Session - 06/10/19 1310    Visit Number  8    Number of Visits  17    Date for PT Re-Evaluation  07/09/19    Authorization Type  Medicare and [place] - 10th visit PN required.  30 VL for OT; 30 VL for PT - 3 used    Authorization - Visit Number  11    Authorization - Number of Visits  30    PT Start Time  [id number]    PT Stop Time  1100    PT Time Calculation (min)  45 min    Activity Tolerance  Patient tolerated treatment well    Behavior During Therapy  WFL for tasks assessed/performed       Past Medical History:  Diagnosis Date  . Anxiety   . Depression   . ICH (intracerebral hemorrhage) ([place])   . Seizures ([place])   . Stroke [facility])     Past Surgical History:  Procedure Laterality Date  . DILATION AND CURETTAGE OF UTERUS      There were no vitals filed for this visit.  Subjective Assessment - 06/10/19 1038    Subjective  Doing well; virtual school has been stressful this week.  Had significant pain in R groin/hip when stretching at the pool on Wednesday.  Also asked PT to check leg length.    Pertinent History  : L CVA with ICH, R hemiplegia, contracture of R ankle muscles, R shoulder adhesive capsulitis, multiple falls with injury, seizures, depression and anxiety    Limitations  Standing;Walking    Patient Stated Goals  More stability, prevent falls, would like to be able to run    Currently in Pain?  No/denies         [facility] PT Assessment - 06/10/19 1041      Special Tests    Special Tests  Leg LengthTest    Leg length test   True;Apparent      True   Length  inches from ASIS > medial malleolus    Right  32 in.    Left   32  in.      Apparent   Comments  following bridge, medial malleoli line up symmetrically                   [facility] Adult PT Treatment/Exercise - 06/10/19 1039      Ambulation/Gait   Ambulation/Gait  Yes    Ambulation/Gait Assistance  6: Modified independent (Device/Increase time)    Ambulation/Gait Assistance Details  Assessed gait with old AFO and then new AFO with t-strap.  With old AFO pt ambulates with mid foot strike with genu recurvatum and hip hike and ER to advance RLE.  In new brace pt is able to heel strike and advance LE over R foot to initiate hip and knee flexion for swing phase.  No genu recurvatum observed during stance.  Mild supination still observed even with stretching and t-straps    Ambulation Distance (Feet)  200 Feet    Assistive device  None    Ambulation Surface  Level;Indoor      Therapeutic Activites    Therapeutic Activities  ADL's    ADL's  Educated on night splint - how to don at night.  Also educated on how to don new AFO with T-strap.  Took video of sequence for T straps with patient's phone so that pt can replicate at home      Lumbar Exercises: Stretches   Hip Flexor Stretch  Right;2 reps;30 seconds    Hip Flexor Stretch Limitations  in prone with knee flexion and then added hip IR with belt - pt demonstrated good technique and able to sense when mm tension begins - does not appear to be over stretching             PT Education - 06/10/19 1309    Education provided  Yes    Education Details  reviewed hip stretches, measured leg length, education for donning night splint and new AFO, gait differences with each AFO    Person(s) Educated  Patient    Methods  Explanation;Demonstration;Other (comment)   video on patient's phone   Comprehension  Verbalized understanding;Returned demonstration       PT Short Term Goals - 06/08/19 1551      PT SHORT TERM GOAL #1   Title  Patient will demonstrate independence with updated stretching and  strengthening HEP    Time  4    Period  Weeks    Status  Achieved    Target Date  06/09/19      PT SHORT TERM GOAL #2   Title  Pt will participate in further assessment of falls risk during gait with gait velocity and FGA    Time  4    Period  Weeks    Status  Achieved    Target Date  06/09/19      PT SHORT TERM GOAL #3   Title  Pt will participate in assessment for new and more appropriate AFO    Time  4    Period  Weeks    Status  Achieved    Target Date  06/09/19      PT SHORT TERM GOAL #4   Title  Pt will tolerate initial set up and fitting of functional electrical stimulation and will begin ROM and gait training with Bioness on RLE (if cleared medically by physician)    Time  4    Period  Weeks    Status  Achieved    Target Date  06/09/19      PT SHORT TERM GOAL #5   Title  Pt will participate in assessment of RLE mechanics when performing exercise on elliptical    Time  4    Period  Weeks    Status  Achieved    Target Date  06/09/19        PT Long Term Goals - 05/18/19 1214      PT LONG TERM GOAL #1   Title  Pt will demonstrate independence with final HEP and improved RLE mechanics on elliptical when using for aerobic exercise (07/09/19)    Time  8    Period  Weeks    Status  New      PT LONG TERM GOAL #2   Title  Pt will demonstrate 3 point improvement in DGI to indicate decreased falls risk    Baseline  19/24    Time  8    Period  Weeks    Status  Revised      PT LONG TERM GOAL #3   Title  Pt will demonstrate improvement in gait velocity with new AFO  to >/= 3.6 ft/sec    Baseline  3.19 with Bioness    Time  8    Period  Weeks    Status  Revised      PT LONG TERM GOAL #4   Title  Pt will demonstrate 5 deg improvement in passive or active R ankle eversion and 20 degree improvement in ankle DF to improve gait mechanics    Baseline  10 deg inversion, lacking 45 deg to neutral DF    Time  8    Period  Weeks    Status  New      PT LONG TERM GOAL #5    Title  Pt will demonstrate ability to don new AFO and will tolerate full day of wear    Time  8    Period  Weeks    Status  New            Plan - 06/10/19 1311    Clinical Impression Statement  Due to pain pt experienced during aquatic therapy reviewed hip stretches and measured leg length.  Pt does not have a true leg length discrepency and in standing pt's pelvis is level.  Rest of session focused on education for how to don new night splint and AFO and gait assessment with new AFO.  Will continue gait training with new AFO at next session.    Personal Factors and Comorbidities  Comorbidity 2;Time since onset of injury/illness/exacerbation;Age    Comorbidities  L CVA with ICH, R hemiplegia, contracture of R ankle muscles, R shoulder adhesive capsulitis, multiple falls with injury, seizures, depression and anxiety, pt is a mother to young children    Examination-Activity Limitations  Locomotion Level;Squat;Stairs;Stand;Caring for Others    Examination-Participation Restrictions  Cleaning;Shop;Community Activity    Stability/Clinical Decision Making  Evolving/Moderate complexity    Rehab Potential  Good    Clinical Impairments Affecting Rehab Potential  time since onset, mm contracture in R ankle    PT Frequency  2x / week    PT Duration  8 weeks    PT Treatment/Interventions  ADLs/Self Care Home Management;Electrical Stimulation;Gait training;Stair training;Functional mobility training;Therapeutic activities;Therapeutic exercise;Balance training;Neuromuscular re-education;Patient/family education;Orthotic Fit/Training;Passive range of motion;Dry needling;Taping    PT Next Visit Plan  Training with new AFO. Bioness and use with elliptical; sit > stand bringing weight forwards over foot; lunges > shift forwards to stand; closed chain hip ABD strengthening, continue intense stretching program for RLE - gastroc, posterior tib, hamstring, hip ER, hip flexor.  RLE strengthening - RLE PNF  patterns into IR/extension; especially hip EXT and ABD/IR  Balance training - rockerboard. Stair Industrial/product designer with Plan of Care  Patient       Patient will benefit from skilled therapeutic intervention in order to improve the following deficits and impairments:  Abnormal gait, Decreased balance, Decreased mobility, Decreased range of motion, Decreased strength, Difficulty walking, Hypomobility, Increased muscle spasms, Impaired flexibility, Impaired tone, Impaired sensation  Visit Diagnosis: Spastic hemiplegia of right dominant side as late effect of other cerebrovascular disease (HCC)  Unsteadiness on feet  Repeated falls  Muscle weakness (generalized)  Stiffness of right ankle, not elsewhere classified     Problem List Patient Active Problem List   Diagnosis Date Noted  . Depression 03/23/2017  . Anxiety 03/23/2017  . Muscle weakness 02/03/2017  . History of hemorrhagic stroke with residual hemiparesis ([place]) 12/10/2016  . Spastic hemiplegia and hemiparesis affecting dominant side ([place]) 09/13/2015  . Adhesive capsulitis of  right shoulder 08/24/2015  . 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 Nontraumatic cortical hemorrhage of cerebral hemisphere ([place])   . Seizure ([place])   . Seizures ([place])   . Adjustment disorder with depressed mood   . Contracture of muscle ankle and foot 06/11/2015  . Hemiplga fol ntrm intcrbl hemor aff right dominant side ([place]) 06/06/2015  . Left-sided intracerebral hemorrhage ([place]) 06/04/2015  . Seizure disorder as sequela of cerebrovascular accident ([place])   . Seizure disorder ([place]) 06/03/2015  . Sepsis ([place]) 06/03/2015  . UTI (urinary tract infection) 06/03/2015  . Hypotension 06/02/2015  . Right spastic hemiparesis ([place]) 05/28/2015  . Aphasia following nontraumatic intracerebral hemorrhage 05/28/2015  . History  of anxiety disorder 05/28/2015  . ICH (intracerebral hemorrhage) ([place])   . Seizure disorder, nonconvulsive, with status epilepticus ([place])   . Cerebral venous thrombosis of cortical vein   . Cytotoxic cerebral edema ([place])   . IVH (intraventricular hemorrhage) ([place])   . Term pregnancy 05/07/2015  . Spontaneous vaginal delivery 05/07/2015    [name], PT, DPT 06/10/19    1:14 PM    [facility] [address] [place], [place], [address] Phone: [phone]   Fax:  [phone]  Name: Carlita Mcphail MRN: [id number] Date of Birth: 1976/02/24

## 2019-06-13 ENCOUNTER — Ambulatory Visit: Payer: Medicare Other | Admitting: Physical Therapy

## 2019-06-13 ENCOUNTER — Encounter: Payer: Self-pay | Admitting: Physical Therapy

## 2019-06-13 ENCOUNTER — Other Ambulatory Visit: Payer: Self-pay

## 2019-06-13 DIAGNOSIS — M25671 Stiffness of right ankle, not elsewhere classified: Secondary | ICD-10-CM

## 2019-06-13 DIAGNOSIS — R2681 Unsteadiness on feet: Secondary | ICD-10-CM

## 2019-06-13 DIAGNOSIS — M25611 Stiffness of right shoulder, not elsewhere classified: Secondary | ICD-10-CM | POA: Diagnosis not present

## 2019-06-13 DIAGNOSIS — I69851 Hemiplegia and hemiparesis following other cerebrovascular disease affecting right dominant side: Secondary | ICD-10-CM | POA: Diagnosis not present

## 2019-06-13 DIAGNOSIS — M6281 Muscle weakness (generalized): Secondary | ICD-10-CM

## 2019-06-13 DIAGNOSIS — R293 Abnormal posture: Secondary | ICD-10-CM | POA: Diagnosis not present

## 2019-06-13 DIAGNOSIS — R296 Repeated falls: Secondary | ICD-10-CM | POA: Diagnosis not present

## 2019-06-13 NOTE — Therapy (Signed)
[facility] [address] [place], [place], [address] Phone: [phone]   Fax:  [phone]  Physical Therapy Treatment  Patient Details  Name: Ashley [name] MRN: [id number] Date of Birth: 04/22/1976 Referring Provider (PT): Marton Redwood, MD   Encounter Date: 06/13/2019  PT End of Session - 06/13/19 1720    Visit Number  9    Number of Visits  17    Date for PT Re-Evaluation  07/09/19    Authorization Type  Medicare and [place] - 10th visit PN required.  30 VL for OT; 30 VL for PT - 3 used    Authorization - Visit Number  12    Authorization - Number of Visits  30    PT Start Time  [id number]    PT Stop Time  1450    PT Time Calculation (min)  46 min    Equipment Utilized During Treatment  Other (comment)   old and new AFO   Activity Tolerance  Patient tolerated treatment well    Behavior During Therapy  WFL for tasks assessed/performed       Past Medical History:  Diagnosis Date  . Anxiety   . Depression   . ICH (intracerebral hemorrhage) ([place])   . Seizures ([place])   . Stroke [facility])     Past Surgical History:  Procedure Laterality Date  . DILATION AND CURETTAGE OF UTERUS      There were no vitals filed for this visit.  Subjective Assessment - 06/13/19 1409    Subjective  T strap came off AFO and pt wasn't able to put it back on even using video.    Pertinent History  : L CVA with ICH, R hemiplegia, contracture of R ankle muscles, R shoulder adhesive capsulitis, multiple falls with injury, seizures, depression and anxiety    Limitations  Standing;Walking    Patient Stated Goals  More stability, prevent falls, would like to be able to run    Currently in Pain?  No/denies                       [facility] Adult PT Treatment/Exercise - 06/13/19 1411      Ambulation/Gait   Ambulation/Gait  Yes    Ambulation/Gait Assistance  6: Modified independent (Device/Increase time)    Ambulation/Gait Assistance  Details  Gait first with old pre-tibial AFO and then with new posteror leaf spring AFO with T strap and lateral wedge.  Used patient's phone with pt's permission to video gait sequence with each and compared gait with each.  Reviewed with pt how new AFO allows her to fully heel strike at inital stance, advance her tibia over her foot and push through toes to come into hip and knee flexion during swing instead of using hip hike and hip ER.    Ambulation Distance (Feet)  200 Feet    Assistive device  None    Ambulation Surface  Indoor;Level    Stairs  Yes    Stairs Assistance  3: Mod assist    Stairs Assistance Details (indicate cue type and reason)  performed with new AFO ascending with alternating sequence with good clearance of RLE.  When descending step over step pt continued to demonstrate mild R hip ER when leading with LLE.  Continued to focus on allowing pelvis to shift forwards for forward weight shift and to allow tibia to advance over R stance foot as pt descended.  Performed repeated step downs at bottom  step with UE support.    Stair Management Technique  Two rails;Alternating pattern;Forwards    Number of Stairs  4    Height of Stairs  6      Therapeutic Activites    Therapeutic Activities  ADL's    ADL's  Reviewed how to attach T strap to AFO and how to don AFO with T strap.  Pt observed and then return demonstrated x 3 reps with therapist providing min-mod verbal cues to sequence.  Also added small wedge to lateral aspect of AFO to assess if wedge would further prevent excess supination at ankle.  By third repetition pt was able to don and strap t-strap with therapist observing but no verbal cues.      Ankle Exercises: Stretches   Slant Board Stretch  2 reps;60 seconds   seated with knee bent and then standing with knee straight            PT Education - 06/13/19 1719    Education provided  Yes    Education Details  see TA for AFO training; stairs with new AFO    Person(s)  Educated  Patient    Methods  Explanation;Demonstration    Comprehension  Verbalized understanding;Returned demonstration       PT Short Term Goals - 06/08/19 1551      PT SHORT TERM GOAL #1   Title  Patient will demonstrate independence with updated stretching and strengthening HEP    Time  4    Period  Weeks    Status  Achieved    Target Date  06/09/19      PT SHORT TERM GOAL #2   Title  Pt will participate in further assessment of falls risk during gait with gait velocity and FGA    Time  4    Period  Weeks    Status  Achieved    Target Date  06/09/19      PT SHORT TERM GOAL #3   Title  Pt will participate in assessment for new and more appropriate AFO    Time  4    Period  Weeks    Status  Achieved    Target Date  06/09/19      PT SHORT TERM GOAL #4   Title  Pt will tolerate initial set up and fitting of functional electrical stimulation and will begin ROM and gait training with Bioness on RLE (if cleared medically by physician)    Time  4    Period  Weeks    Status  Achieved    Target Date  06/09/19      PT SHORT TERM GOAL #5   Title  Pt will participate in assessment of RLE mechanics when performing exercise on elliptical    Time  4    Period  Weeks    Status  Achieved    Target Date  06/09/19        PT Long Term Goals - 05/18/19 1214      PT LONG TERM GOAL #1   Title  Pt will demonstrate independence with final HEP and improved RLE mechanics on elliptical when using for aerobic exercise (07/09/19)    Time  8    Period  Weeks    Status  New      PT LONG TERM GOAL #2   Title  Pt will demonstrate 3 point improvement in DGI to indicate decreased falls risk    Baseline  19/24    Time  8    Period  Weeks    Status  Revised      PT LONG TERM GOAL #3   Title  Pt will demonstrate improvement in gait velocity with new AFO to >/= 3.6 ft/sec    Baseline  3.19 with Bioness    Time  8    Period  Weeks    Status  Revised      PT LONG TERM GOAL #4   Title   Pt will demonstrate 5 deg improvement in passive or active R ankle eversion and 20 degree improvement in ankle DF to improve gait mechanics    Baseline  10 deg inversion, lacking 45 deg to neutral DF    Time  8    Period  Weeks    Status  New      PT LONG TERM GOAL #5   Title  Pt will demonstrate ability to don new AFO and will tolerate full day of wear    Time  8    Period  Weeks    Status  New            Plan - 06/13/19 1720    Clinical Impression Statement  Continued training with new AFO focusing on independence with donning, gait mechanics with over ground ambulation and stair negotiation.  Will continue to address gait compensations with new AFO and Bioness to continue to progress towards LTG.    Personal Factors and Comorbidities  Comorbidity 2;Time since onset of injury/illness/exacerbation;Age    Comorbidities  L CVA with ICH, R hemiplegia, contracture of R ankle muscles, R shoulder adhesive capsulitis, multiple falls with injury, seizures, depression and anxiety, pt is a mother to young children    Examination-Activity Limitations  Locomotion Level;Squat;Stairs;Stand;Caring for Others    Examination-Participation Restrictions  Cleaning;Shop;Community Activity    Stability/Clinical Decision Making  Evolving/Moderate complexity    Rehab Potential  Good    Clinical Impairments Affecting Rehab Potential  time since onset, mm contracture in R ankle    PT Frequency  2x / week    PT Duration  8 weeks    PT Treatment/Interventions  ADLs/Self Care Home Management;Electrical Stimulation;Gait training;Stair training;Functional mobility training;Therapeutic activities;Therapeutic exercise;Balance training;Neuromuscular re-education;Patient/family education;Orthotic Fit/Training;Passive range of motion;Dry needling;Taping    PT Next Visit Plan  Training with new AFO with gait and stairs-especially descending/step downs. Bioness and use with elliptical; sit > stand bringing weight forwards  over foot; closed chain hip ABD strengthening, continue intense stretching program for RLE - gastroc, posterior tib, hamstring, hip ER, hip flexor.  RLE strengthening - RLE PNF patterns into IR/extension; especially hip EXT and ABD/IR  Balance training - rockerboard. Stair Industrial/product designer with Plan of Care  Patient       Patient will benefit from skilled therapeutic intervention in order to improve the following deficits and impairments:  Abnormal gait, Decreased balance, Decreased mobility, Decreased range of motion, Decreased strength, Difficulty walking, Hypomobility, Increased muscle spasms, Impaired flexibility, Impaired tone, Impaired sensation  Visit Diagnosis: Unsteadiness on feet  Spastic hemiplegia of right dominant side as late effect of other cerebrovascular disease (HCC)  Repeated falls  Muscle weakness (generalized)  Stiffness of right ankle, not elsewhere classified     Problem List Patient Active Problem List   Diagnosis Date Noted  . Depression 03/23/2017  . Anxiety 03/23/2017  . Muscle weakness 02/03/2017  . History of hemorrhagic stroke with residual hemiparesis ([place]) 12/10/2016  . Spastic hemiplegia and hemiparesis affecting  dominant side ([place]) 09/13/2015  . Adhesive capsulitis of right shoulder 08/24/2015  . Localization-related symptomatic epilepsy and epileptic syndromes with simple partial seizures, not intractable, without status epilepticus ([place]) 08/21/2015  . Nontraumatic cortical hemorrhage of cerebral hemisphere ([place])   . Seizure ([place])   . Seizures ([place])   . Adjustment disorder with depressed mood   . Contracture of muscle ankle and foot 06/11/2015  . Hemiplga fol ntrm intcrbl hemor aff right dominant side ([place]) 06/06/2015  . Left-sided intracerebral hemorrhage ([place]) 06/04/2015  . Seizure disorder as sequela of cerebrovascular accident ([place])   . Seizure disorder ([place]) 06/03/2015  . Sepsis ([place]) 06/03/2015  . UTI (urinary tract  infection) 06/03/2015  . Hypotension 06/02/2015  . Right spastic hemiparesis ([place]) 05/28/2015  . Aphasia following nontraumatic intracerebral hemorrhage 05/28/2015  . History of anxiety disorder 05/28/2015  . ICH (intracerebral hemorrhage) ([place])   . Seizure disorder, nonconvulsive, with status epilepticus ([place])   . Cerebral venous thrombosis of cortical vein   . Cytotoxic cerebral edema ([place])   . IVH (intraventricular hemorrhage) ([place])   . Term pregnancy 05/07/2015  . Spontaneous vaginal delivery 05/07/2015    [name], PT, DPT 06/13/19    5:24 PM    [facility] [address] [place], [place], [address] Phone: [phone]   Fax:  [phone]  Name: Ashley [name] MRN: [id number] Date of Birth: Aug 21, 1976

## 2019-06-15 ENCOUNTER — Other Ambulatory Visit: Payer: Self-pay

## 2019-06-15 ENCOUNTER — Ambulatory Visit: Payer: Medicare Other | Admitting: Physical Therapy

## 2019-06-15 ENCOUNTER — Ambulatory Visit: Payer: Medicare Other | Attending: Physical Medicine & Rehabilitation | Admitting: Occupational Therapy

## 2019-06-15 DIAGNOSIS — R2681 Unsteadiness on feet: Secondary | ICD-10-CM | POA: Insufficient documentation

## 2019-06-15 DIAGNOSIS — I69851 Hemiplegia and hemiparesis following other cerebrovascular disease affecting right dominant side: Secondary | ICD-10-CM | POA: Insufficient documentation

## 2019-06-15 DIAGNOSIS — R296 Repeated falls: Secondary | ICD-10-CM | POA: Insufficient documentation

## 2019-06-15 DIAGNOSIS — M25611 Stiffness of right shoulder, not elsewhere classified: Secondary | ICD-10-CM | POA: Insufficient documentation

## 2019-06-15 DIAGNOSIS — M25671 Stiffness of right ankle, not elsewhere classified: Secondary | ICD-10-CM | POA: Insufficient documentation

## 2019-06-15 DIAGNOSIS — M6281 Muscle weakness (generalized): Secondary | ICD-10-CM | POA: Insufficient documentation

## 2019-06-15 DIAGNOSIS — R293 Abnormal posture: Secondary | ICD-10-CM | POA: Insufficient documentation

## 2019-06-15 NOTE — Therapy (Signed)
[facility] [address] [place], [place], [address] Phone: [phone]   Fax:  [phone]  Occupational Therapy Treatment  Patient Details  Name: Ashley [name] MRN: [id number] Date of Birth: 04/16/1976 Referring Provider (OT): Dr William Shaw   Encounter Date: 06/15/2019    Past Medical History:  Diagnosis Date  . Anxiety   . Depression   . ICH (intracerebral hemorrhage) (HCC)   . Seizures (HCC)   . Stroke (HCC)     Past Surgical History:  Procedure Laterality Date  . DILATION AND CURETTAGE OF UTERUS      There were no vitals filed for this visit.     Pt arrived for aquatic therapy appointment today and tripped over unseen item in the parking lot. Patient notified therapist via cell phone and therapist went to the parking lot and assisted pt up.  Pt had surface abrasion on her L shin, small abrasion on L knee.  Pt assisted into aquatic center and seated. Pt denied any pain and denied any difficulty with weight bearing or walking.  Obtained first aid kit to clean and apply antibacterial lotion.  Pt stated she could stop and obtain large bandage to cover abrasion. Recommended that pt apply ice and elevate LLE if pt noted any increased swelling. Pt had very small area of swelling on shin. Unable to have pt enter the water as there was not a large enough bandage in first aid kit to cover abrasion. Pt supervised out to parking lot and reported "I am fine I am just embarrassed."  Reassured pt and provided support.  No charge for session today.                      OT Short Term Goals - 06/08/19 1735      OT SHORT TERM GOAL #1   Title  Pt will demonstrate improved overhead reach to at least 130* of shoulder flexion RUE  to assist with heavier overhead home mgmt tasks -7/8//2020 (goal date adjusted to meet 4 visits)    Status  Achieved      OT SHORT TERM GOAL #2   Title  Pt will demonstrate improved  RUE strength /core strength to assist with lifting heavy items in grocery store with greater ease (per pt report)    Status  Achieved      OT SHORT TERM GOAL #3   Title  Pt will demonstrate ability to lift and carry a 5 pound object in the grocery store with her RUE with no LOB - 06/22/2019 (date adjusted due to hold first two weeks in August due to scheduling)        OT Long Term Goals - 06/08/19 1735      OT LONG TERM GOAL #1   Title  Pt will be mod I with upgraded HEP to include aquatic based HEP - 07/20/2019    Status  On-going      OT LONG TERM GOAL #2   Title  Pt will demonstrate improved postural alignment and control to reduce falls (has been falling at least 1x/month)    Status  On-going              Patient will benefit from skilled therapeutic intervention in order to improve the following deficits and impairments:           Visit Diagnosis: Unsteadiness on feet    Problem List Patient Active Problem List   Diagnosis Date Noted  .   Depression 03/23/2017  . Anxiety 03/23/2017  . Muscle weakness 02/03/2017  . History of hemorrhagic stroke with residual hemiparesis (HCC) 12/10/2016  . Spastic hemiplegia and hemiparesis affecting dominant side (HCC) 09/13/2015  . Adhesive capsulitis of right shoulder 08/24/2015  . Localization-related symptomatic epilepsy and epileptic syndromes with simple partial seizures, not intractable, without status epilepticus (HCC) 08/21/2015  . Nontraumatic cortical hemorrhage of cerebral hemisphere (HCC)   . Seizure (HCC)   . Seizures (HCC)   . Adjustment disorder with depressed mood   . Contracture of muscle ankle and foot 06/11/2015  . Hemiplga fol ntrm intcrbl hemor aff right dominant side (HCC) 06/06/2015  . Left-sided intracerebral hemorrhage (HCC) 06/04/2015  . Seizure disorder as sequela of cerebrovascular accident (HCC)   . Seizure disorder (HCC) 06/03/2015  . Sepsis (HCC) 06/03/2015  . UTI (urinary tract infection)  06/03/2015  . Hypotension 06/02/2015  . Right spastic hemiparesis (HCC) 05/28/2015  . Aphasia following nontraumatic intracerebral hemorrhage 05/28/2015  . History of anxiety disorder 05/28/2015  . ICH (intracerebral hemorrhage) (HCC)   . Seizure disorder, nonconvulsive, with status epilepticus (HCC)   . Cerebral venous thrombosis of cortical vein   . Cytotoxic cerebral edema (HCC)   . IVH (intraventricular hemorrhage) (HCC)   . Term pregnancy 05/07/2015  . Spontaneous vaginal delivery 05/07/2015    ,  [place], OTR/L 06/15/2019, 4:58 PM  [facility] [address] [place], [place], [address] Phone: [phone]   Fax:  [phone]  Name: Ashley [name] MRN: [id number] Date of Birth: 10/19/1975 

## 2019-06-17 ENCOUNTER — Ambulatory Visit: Payer: Medicare Other | Admitting: Physical Therapy

## 2019-06-17 ENCOUNTER — Other Ambulatory Visit: Payer: Self-pay

## 2019-06-17 DIAGNOSIS — R2681 Unsteadiness on feet: Secondary | ICD-10-CM

## 2019-06-17 DIAGNOSIS — M6281 Muscle weakness (generalized): Secondary | ICD-10-CM

## 2019-06-17 DIAGNOSIS — R296 Repeated falls: Secondary | ICD-10-CM | POA: Diagnosis not present

## 2019-06-17 DIAGNOSIS — I69851 Hemiplegia and hemiparesis following other cerebrovascular disease affecting right dominant side: Secondary | ICD-10-CM | POA: Diagnosis not present

## 2019-06-17 DIAGNOSIS — M25611 Stiffness of right shoulder, not elsewhere classified: Secondary | ICD-10-CM | POA: Diagnosis not present

## 2019-06-17 DIAGNOSIS — M25671 Stiffness of right ankle, not elsewhere classified: Secondary | ICD-10-CM

## 2019-06-17 DIAGNOSIS — R293 Abnormal posture: Secondary | ICD-10-CM | POA: Diagnosis not present

## 2019-06-17 NOTE — Therapy (Signed)
[facility] [address] [place], [place], [address] Phone: [phone]   Fax:  [phone]  Physical Therapy Treatment and 10th visit Progress Note  Patient Details  Name: Ashley [name] MRN: [id number] Date of Birth: 04-14-1976 Referring Provider (PT): Marton Redwood, MD   Encounter Date: 06/17/2019   Progress Note Reporting Period 05/10/2019 to 06/17/2019  See note below for Objective Data and Assessment of Progress/Goals.    PT End of Session - 06/17/19 1233    Visit Number  10    Number of Visits  17    Date for PT Re-Evaluation  07/09/19    Authorization Type  Medicare and [employer] - 10th visit PN required.  30 VL for OT; 30 VL for PT - 3 used    Authorization - Visit Number  13    Authorization - Number of Visits  30    PT Start Time  1019    PT Stop Time  1102    PT Time Calculation (min)  43 min    Equipment Utilized During Treatment  Other (comment)   old and new AFO   Activity Tolerance  Patient tolerated treatment well    Behavior During Therapy  WFL for tasks assessed/performed       Past Medical History:  Diagnosis Date  . Anxiety   . Depression   . ICH (intracerebral hemorrhage) ([place])   . Seizures ([place])   . Stroke [facility])     Past Surgical History:  Procedure Laterality Date  . DILATION AND CURETTAGE OF UTERUS      There were no vitals filed for this visit.                    [facility] Adult PT Treatment/Exercise - 06/17/19 1224      Ambulation/Gait   Stairs  Yes    Stairs Assistance  4: Min assist    Stairs Assistance Details (indicate cue type and reason)  attempted to carry over step down training to stairs with continued focus on forward weight shifting over R foot and maintaining neutral hip alignment.  Decreased external rotation noted when descending stairs but continues to have increased difficulty shifting weight anteriorly without assistance    Stair Management Technique   Two rails;Alternating pattern;Step to pattern;Forwards    Number of Stairs  8    Height of Stairs  6      Therapeutic Activites    Therapeutic Activities  ADL's    ADL's  Pt continues to have difficulty independently strapping AFO with posterior strap coming loose often.  Attempted to replace/add extra velcro on side of T strap but unable to keep it in place on fabric.  Placed one small piece on back of AFO to hold tension and then demonstrated how to tuck strap to ensure it held on velcro.  Continues to require extra time to don.        Knee/Hip Exercises: Standing   Step Down  Right;Left;4 sets;10 reps;Hand Hold: 2;Hand Hold: 1;Step Height: 4";Step Height: 6"    Step Down Limitations  on 4" step and then 6" step with bilat UE support > one UE support performed 10 reps alternating heel tap downs and then full step down with weight shift without AFO donned to focus on functional ROM of R ankle in closed chain dorsiflexion while maintaining anterior pelvic rotation and hip IR when shifting weight forwards.      Ankle Exercises: Patent attorney  Stretch  5 reps;30 seconds   2 sets x 5 reps   Other Stretch  on ankle rocker performing gastroc stretch x 5 reps bilaterally with knee straight focusing on bringing hips forwards and then performed 5 more reps of coming into a squat on ankle rocker keeping R pelvis rotated forwards and weight back on heels and holding 10-20 seconds with UE support             PT Education - 06/17/19 1233    Education provided  Yes    Education Details  see TA for continued AFO education    Person(s) Educated  Patient    Methods  Explanation;Demonstration    Comprehension  Verbalized understanding;Returned demonstration       PT Short Term Goals - 06/08/19 1551      PT SHORT TERM GOAL #1   Title  Patient will demonstrate independence with updated stretching and strengthening HEP    Time  4    Period  Weeks    Status  Achieved    Target Date   06/09/19      PT SHORT TERM GOAL #2   Title  Pt will participate in further assessment of falls risk during gait with gait velocity and FGA    Time  4    Period  Weeks    Status  Achieved    Target Date  06/09/19      PT SHORT TERM GOAL #3   Title  Pt will participate in assessment for new and more appropriate AFO    Time  4    Period  Weeks    Status  Achieved    Target Date  06/09/19      PT SHORT TERM GOAL #4   Title  Pt will tolerate initial set up and fitting of functional electrical stimulation and will begin ROM and gait training with Bioness on RLE (if cleared medically by physician)    Time  4    Period  Weeks    Status  Achieved    Target Date  06/09/19      PT SHORT TERM GOAL #5   Title  Pt will participate in assessment of RLE mechanics when performing exercise on elliptical    Time  4    Period  Weeks    Status  Achieved    Target Date  06/09/19        PT Long Term Goals - 05/18/19 1214      PT LONG TERM GOAL #1   Title  Pt will demonstrate independence with final HEP and improved RLE mechanics on elliptical when using for aerobic exercise (07/09/19)    Time  8    Period  Weeks    Status  New      PT LONG TERM GOAL #2   Title  Pt will demonstrate 3 point improvement in DGI to indicate decreased falls risk    Baseline  19/24    Time  8    Period  Weeks    Status  Revised      PT LONG TERM GOAL #3   Title  Pt will demonstrate improvement in gait velocity with new AFO to >/= 3.6 ft/sec    Baseline  3.19 with Bioness    Time  8    Period  Weeks    Status  Revised      PT LONG TERM GOAL #4   Title  Pt will demonstrate 5 deg improvement  in passive or active R ankle eversion and 20 degree improvement in ankle DF to improve gait mechanics    Baseline  10 deg inversion, lacking 45 deg to neutral DF    Time  8    Period  Weeks    Status  New      PT LONG TERM GOAL #5   Title  Pt will demonstrate ability to don new AFO and will tolerate full day of wear     Time  8    Period  Weeks    Status  New            Plan - 06/17/19 1234    Clinical Impression Statement  Continued to address pt's ability to don AFO independently and set up of AFO for maximum benefit.  Continued to address hip strengthening and ankle ROM during functional and dynamic gait activities.  Will return to use of Bioness at next session once pt is independent with AFO.    Personal Factors and Comorbidities  Comorbidity 2;Time since onset of injury/illness/exacerbation;Age    Comorbidities  L CVA with ICH, R hemiplegia, contracture of R ankle muscles, R shoulder adhesive capsulitis, multiple falls with injury, seizures, depression and anxiety, pt is a mother to young children    Examination-Activity Limitations  Locomotion Level;Squat;Stairs;Stand;Caring for Others    Examination-Participation Restrictions  Cleaning;Shop;Community Activity    Stability/Clinical Decision Making  Evolving/Moderate complexity    Rehab Potential  Good    Clinical Impairments Affecting Rehab Potential  time since onset, mm contracture in R ankle    PT Frequency  2x / week    PT Duration  8 weeks    PT Treatment/Interventions  ADLs/Self Care Home Management;Electrical Stimulation;Gait training;Stair training;Functional mobility training;Therapeutic activities;Therapeutic exercise;Balance training;Neuromuscular re-education;Patient/family education;Orthotic Fit/Training;Passive range of motion;Dry needling;Taping    PT Next Visit Plan  Training with new AFO with gait and stairs-especially descending/step downs (also with Bioness).  Stepping over obstacles, gait on pavement, balance reactions if foot catches.  Bioness and use with elliptical; sit > stand bringing weight forwards over foot; closed chain hip ABD strengthening, continue intense stretching program for RLE - gastroc, posterior tib, hamstring, hip ER, hip flexor.  RLE strengthening - RLE PNF patterns into IR/extension; especially hip EXT and  ABD/IR  Balance training - rockerboard. Stair Industrial/product designer with Plan of Care  Patient       Patient will benefit from skilled therapeutic intervention in order to improve the following deficits and impairments:  Abnormal gait, Decreased balance, Decreased mobility, Decreased range of motion, Decreased strength, Difficulty walking, Hypomobility, Increased muscle spasms, Impaired flexibility, Impaired tone, Impaired sensation  Visit Diagnosis: Unsteadiness on feet  Spastic hemiplegia of right dominant side as late effect of other cerebrovascular disease (HCC)  Repeated falls  Muscle weakness (generalized)  Stiffness of right ankle, not elsewhere classified     Problem List Patient Active Problem List   Diagnosis Date Noted  . Depression 03/23/2017  . Anxiety 03/23/2017  . Muscle weakness 02/03/2017  . History of hemorrhagic stroke with residual hemiparesis ([place]) 12/10/2016  . Spastic hemiplegia and hemiparesis affecting dominant side ([place]) 09/13/2015  . Adhesive capsulitis of right shoulder 08/24/2015  . Localization-related symptomatic epilepsy and epileptic syndromes with simple partial seizures, not intractable, without status epilepticus ([place]) 08/21/2015  . Nontraumatic cortical hemorrhage of cerebral hemisphere ([place])   . Seizure ([place])   . Seizures ([place])   . Adjustment disorder with depressed mood   .  Contracture of muscle ankle and foot 06/11/2015  . Hemiplga fol ntrm intcrbl hemor aff right dominant side ([place]) 06/06/2015  . Left-sided intracerebral hemorrhage ([place]) 06/04/2015  . Seizure disorder as sequela of cerebrovascular accident ([place])   . Seizure disorder ([place]) 06/03/2015  . Sepsis ([place]) 06/03/2015  . UTI (urinary tract infection) 06/03/2015  . Hypotension 06/02/2015  . Right spastic hemiparesis ([place]) 05/28/2015  . Aphasia following nontraumatic intracerebral hemorrhage 05/28/2015  . History of anxiety disorder 05/28/2015  . ICH  (intracerebral hemorrhage) ([place])   . Seizure disorder, nonconvulsive, with status epilepticus ([place])   . Cerebral venous thrombosis of cortical vein   . Cytotoxic cerebral edema ([place])   . IVH (intraventricular hemorrhage) ([place])   . Term pregnancy 05/07/2015  . Spontaneous vaginal delivery 05/07/2015    [name], PT, DPT 06/17/19    12:39 PM    [facility] [address] [name], [place], [address] Phone: [phone]   Fax:  [phone]  Name: Ashley [name] MRN: [id number] Date of Birth: 1976/03/10

## 2019-06-22 ENCOUNTER — Other Ambulatory Visit: Payer: Self-pay

## 2019-06-22 ENCOUNTER — Encounter: Payer: Self-pay | Admitting: Physical Therapy

## 2019-06-22 ENCOUNTER — Ambulatory Visit: Payer: Medicare Other | Admitting: Physical Therapy

## 2019-06-22 DIAGNOSIS — R2681 Unsteadiness on feet: Secondary | ICD-10-CM

## 2019-06-22 DIAGNOSIS — R293 Abnormal posture: Secondary | ICD-10-CM | POA: Diagnosis not present

## 2019-06-22 DIAGNOSIS — M25611 Stiffness of right shoulder, not elsewhere classified: Secondary | ICD-10-CM | POA: Diagnosis not present

## 2019-06-22 DIAGNOSIS — M25671 Stiffness of right ankle, not elsewhere classified: Secondary | ICD-10-CM | POA: Diagnosis not present

## 2019-06-22 DIAGNOSIS — R296 Repeated falls: Secondary | ICD-10-CM | POA: Diagnosis not present

## 2019-06-22 DIAGNOSIS — I69851 Hemiplegia and hemiparesis following other cerebrovascular disease affecting right dominant side: Secondary | ICD-10-CM | POA: Diagnosis not present

## 2019-06-22 DIAGNOSIS — M6281 Muscle weakness (generalized): Secondary | ICD-10-CM

## 2019-06-22 NOTE — Therapy (Signed)
[facility] [address] [place], [place], [address] Phone: [phone]   Fax:  [phone]  Physical Therapy Treatment  Patient Details  Name: Ashley [name] MRN: [id number] Date of Birth: [birth year] Referring Provider (PT): Marton Redwood, MD   Encounter Date: 06/22/2019  PT End of Session - 06/22/19 1602    Visit Number  11    Number of Visits  17    Date for PT Re-Evaluation  07/09/19    Authorization Type  Medicare and [place] - 10th visit PN required.  30 VL for OT; 30 VL for PT - 3 used    Authorization - Visit Number  14    Authorization - Number of Visits  30    PT Start Time  1020    PT Stop Time  1105    PT Time Calculation (min)  45 min    Equipment Utilized During Treatment  Other (comment)   Bioness   Activity Tolerance  Patient tolerated treatment well    Behavior During Therapy  WFL for tasks assessed/performed       Past Medical History:  Diagnosis Date  . Anxiety   . Depression   . ICH (intracerebral hemorrhage) ([place])   . Seizures ([place])   . Stroke [facility])     Past Surgical History:  Procedure Laterality Date  . DILATION AND CURETTAGE OF UTERUS      There were no vitals filed for this visit.  Subjective Assessment - 06/22/19 1026    Subjective  Has been more independent with donning AFO and straps staying in place.  Husband is noticing a difference in her walking.    Pertinent History  : L CVA with ICH, R hemiplegia, contracture of R ankle muscles, R shoulder adhesive capsulitis, multiple falls with injury, seizures, depression and anxiety    Limitations  Standing;Walking    Patient Stated Goals  More stability, prevent falls, would like to be able to run                       [facility] Adult PT Treatment/Exercise - 06/22/19 1504      Ambulation/Gait   Ambulation/Gait  Yes    Ambulation/Gait Assistance  6: Modified independent (Device/Increase time)    Ambulation/Gait Assistance  Details  with Bioness in gait mode focus on carry over of anterior weight shifting initiation from pelvis during gait    Ambulation Distance (Feet)  115 Feet    Assistive device  None    Ambulation Surface  Level;Indoor      Knee/Hip Exercises: Standing   Step Down  Left;1 set;10 reps;Hand Hold: 2;Hand Hold: 1;Hand Hold: 0;Step Height: 4"    Step Down Limitations  with Bioness in training mode with focus on closed chain ankle DF, anterior pelvic rotation of R pelvis and anterior weight shift to front LLE once descended.  With anterior pelvic rotation pt demonstrated decreased hip ER and required decreased assistance from therapist to maintain neutral hip rotation.  Intermittent cues to shift weight fully to RLE.  When UE removed for support pt unable to control forward weight shift to LLE and required increased assistance from therapist for balance and to prevent anterior LOB.       Modalities   Modalities  Electrical engineer Stimulation Location  R anterior tibialis and hamstring mm groups    Electrical Stimulation Action  open and closed chain ankle DF,  knee flexion and hip extension    Electrical Stimulation Parameters  See tablet 1 for parameters; steering electrode    Electrical Stimulation Goals  Strength;Tone;Neuromuscular facilitation      Ankle Exercises: Stretches   Slant Board Stretch  5 reps;20 seconds   bilat with Bioness in training mode         Balance Exercises - 06/22/19 1514      Balance Exercises: Standing   Rockerboard  Anterior/posterior;UE support    Other Standing Exercises  Standing on large rockerboard with feet staggered, L foot forwards, R foot back and Bioness on in training mode performed alternating weight shifting forwards and backwards focusing on control of weight shift forwards to LLE and then back to RLE.  Once pt able to initiate weight shift at pelvis she demonstrated improved control of weight shifting  forwards and decreased episodes of anterior LOB          PT Short Term Goals - 06/08/19 1551      PT SHORT TERM GOAL #1   Title  Patient will demonstrate independence with updated stretching and strengthening HEP    Time  4    Period  Weeks    Status  Achieved    Target Date  06/09/19      PT SHORT TERM GOAL #2   Title  Pt will participate in further assessment of falls risk during gait with gait velocity and FGA    Time  4    Period  Weeks    Status  Achieved    Target Date  06/09/19      PT SHORT TERM GOAL #3   Title  Pt will participate in assessment for new and more appropriate AFO    Time  4    Period  Weeks    Status  Achieved    Target Date  06/09/19      PT SHORT TERM GOAL #4   Title  Pt will tolerate initial set up and fitting of functional electrical stimulation and will begin ROM and gait training with Bioness on RLE (if cleared medically by physician)    Time  4    Period  Weeks    Status  Achieved    Target Date  06/09/19      PT SHORT TERM GOAL #5   Title  Pt will participate in assessment of RLE mechanics when performing exercise on elliptical    Time  4    Period  Weeks    Status  Achieved    Target Date  06/09/19        PT Long Term Goals - 05/18/19 1214      PT LONG TERM GOAL #1   Title  Pt will demonstrate independence with final HEP and improved RLE mechanics on elliptical when using for aerobic exercise (07/09/19)    Time  8    Period  Weeks    Status  New      PT LONG TERM GOAL #2   Title  Pt will demonstrate 3 point improvement in DGI to indicate decreased falls risk    Baseline  19/24    Time  8    Period  Weeks    Status  Revised      PT LONG TERM GOAL #3   Title  Pt will demonstrate improvement in gait velocity with new AFO to >/= 3.6 ft/sec    Baseline  3.19 with Bioness    Time  8  Period  Weeks    Status  Revised      PT LONG TERM GOAL #4   Title  Pt will demonstrate 5 deg improvement in passive or active R ankle  eversion and 20 degree improvement in ankle DF to improve gait mechanics    Baseline  10 deg inversion, lacking 45 deg to neutral DF    Time  8    Period  Weeks    Status  New      PT LONG TERM GOAL #5   Title  Pt will demonstrate ability to don new AFO and will tolerate full day of wear    Time  8    Period  Weeks    Status  New            Plan - 06/22/19 1604    Clinical Impression Statement  Returned to use of Bioness functional electrical stimulation to facilitate increased ankle DF and hamstring activation while performing stretches to R calf to increase available range for closed chain ankle DF for descending stairs and anterior weight shifting during gait.  Pt tolerated well; will continue to address in order to progress towards LTG.    Personal Factors and Comorbidities  Comorbidity 2;Time since onset of injury/illness/exacerbation;Age    Comorbidities  L CVA with ICH, R hemiplegia, contracture of R ankle muscles, R shoulder adhesive capsulitis, multiple falls with injury, seizures, depression and anxiety, pt is a mother to young children    Examination-Activity Limitations  Locomotion Level;Squat;Stairs;Stand;Caring for Others    Examination-Participation Restrictions  Cleaning;Shop;Community Activity    Stability/Clinical Decision Making  Evolving/Moderate complexity    Rehab Potential  Good    Clinical Impairments Affecting Rehab Potential  time since onset, mm contracture in R ankle    PT Frequency  2x / week    PT Duration  8 weeks    PT Treatment/Interventions  ADLs/Self Care Home Management;Electrical Stimulation;Gait training;Stair training;Functional mobility training;Therapeutic activities;Therapeutic exercise;Balance training;Neuromuscular re-education;Patient/family education;Orthotic Fit/Training;Passive range of motion;Dry needling;Taping    PT Next Visit Plan  descending/step downs (also with Bioness); rockerboard and anterior weight shifting staggered stance.   Stepping over obstacles, gait on pavement, balance reactions if foot catches.  Bioness and use with elliptical; sit > stand bringing weight forwards over foot; closed chain hip ABD strengthening, continue intense stretching program for RLE - gastroc, posterior tib, hamstring, hip ER, hip flexor.  RLE strengthening - RLE PNF patterns into IR/extension; especially hip EXT and ABD/IR  Balance training - rockerboard. Stair Industrial/product designer with Plan of Care  Patient       Patient will benefit from skilled therapeutic intervention in order to improve the following deficits and impairments:  Abnormal gait, Decreased balance, Decreased mobility, Decreased range of motion, Decreased strength, Difficulty walking, Hypomobility, Increased muscle spasms, Impaired flexibility, Impaired tone, Impaired sensation  Visit Diagnosis: Unsteadiness on feet  Spastic hemiplegia of right dominant side as late effect of other cerebrovascular disease (HCC)  Repeated falls  Muscle weakness (generalized)  Stiffness of right ankle, not elsewhere classified     Problem List Patient Active Problem List   Diagnosis Date Noted  . Depression 03/23/2017  . Anxiety 03/23/2017  . Muscle weakness 02/03/2017  . History of hemorrhagic stroke with residual hemiparesis ([place]) 12/10/2016  . Spastic hemiplegia and hemiparesis affecting dominant side ([place]) 09/13/2015  . Adhesive capsulitis of right shoulder 08/24/2015  . Localization-related symptomatic epilepsy and epileptic syndromes with simple partial seizures, not intractable,  without status epilepticus ([place]) 08/21/2015  . Nontraumatic cortical hemorrhage of cerebral hemisphere ([place])   . Seizure ([place])   . Seizures ([place])   . Adjustment disorder with depressed mood   . Contracture of muscle ankle and foot 06/11/2015  . Hemiplga fol ntrm intcrbl hemor aff right dominant side ([place]) 06/06/2015  . Left-sided intracerebral hemorrhage ([place]) 06/04/2015  .  Seizure disorder as sequela of cerebrovascular accident ([place])   . Seizure disorder ([place]) 06/03/2015  . Sepsis ([place]) 06/03/2015  . UTI (urinary tract infection) 06/03/2015  . Hypotension 06/02/2015  . Right spastic hemiparesis ([place]) 05/28/2015  . Aphasia following nontraumatic intracerebral hemorrhage 05/28/2015  . History of anxiety disorder 05/28/2015  . ICH (intracerebral hemorrhage) ([place])   . Seizure disorder, nonconvulsive, with status epilepticus ([place])   . Cerebral venous thrombosis of cortical vein   . Cytotoxic cerebral edema ([place])   . IVH (intraventricular hemorrhage) ([place])   . Term pregnancy 05/07/2015  . Spontaneous vaginal delivery 05/07/2015    [name], PT, DPT 06/22/19    4:09 PM    [facility] [address] [place], [place], [address] Phone: [phone]   Fax:  [phone]  Name: Ashley [name] MRN: [id number] Date of Birth: 31-Mar-1976

## 2019-06-23 ENCOUNTER — Encounter: Payer: Medicare Other | Attending: Physical Medicine & Rehabilitation | Admitting: Physical Medicine & Rehabilitation

## 2019-06-23 ENCOUNTER — Encounter: Payer: Self-pay | Admitting: Physical Medicine & Rehabilitation

## 2019-06-23 VITALS — BP 102/65 | HR 69 | Temp 98.0°F | Ht 63.0 in | Wt 124.0 lb

## 2019-06-23 DIAGNOSIS — G8110 Spastic hemiplegia affecting unspecified side: Secondary | ICD-10-CM | POA: Insufficient documentation

## 2019-06-23 NOTE — Progress Notes (Signed)
Subjective:    Patient ID: Ashley [name], female    DOB: 10-12-1976, 43 y.o.   MRN: [id number]  HPI   7/28 Post tib 75U Gastroc 150U FDL 75   Pain Inventory Average Pain 0 Pain Right Now 0 My pain is tingling  In the last 24 hours, has pain interfered with the following? General activity 0 Relation with others 0 Enjoyment of life 0 What TIME of day is your pain at its worst? na Sleep (in general) Good  Pain is worse with: na Pain improves with: na Relief from Meds: na  Mobility walk without assistance ability to climb steps?  yes do you drive?  yes  Function disabled: date disabled 2016  Neuro/Psych numbness tingling trouble walking depression anxiety  Prior Studies Any changes since last visit?  no  Physicians involved in your care Any changes since last visit?  no   Family History  Problem Relation Age of Onset  . Cancer Mother   . Cancer Father        pancreatic   Social History   Socioeconomic History  . Marital status: Married    Spouse name: Not on file  . Number of children: Not on file  . Years of education: Not on file  . Highest education level: Not on file  Occupational History  . Not on file  Social Needs  . Financial resource strain: Not on file  . Food insecurity    Worry: Not on file    Inability: Not on file  . Transportation needs    Medical: Not on file    Non-medical: Not on file  Tobacco Use  . Smoking status: Never Smoker  . Smokeless tobacco: Never Used  Substance and Sexual Activity  . Alcohol use: Yes    Alcohol/week: 1.0 standard drinks    Types: 1 Glasses of wine per week    Comment: casual   . Drug use: No  . Sexual activity: Yes    Birth control/protection: None    Comment: husband has vasectomy  Lifestyle  . Physical activity    Days per week: Not on file    Minutes per session: Not on file  . Stress: Not on file  Relationships  . Social Herbalist on phone: Not on file    Gets  together: Not on file    Attends religious service: Not on file    Active member of club or organization: Not on file    Attends meetings of clubs or organizations: Not on file    Relationship status: Not on file  Other Topics Concern  . Not on file  Social History Narrative   Lives with husband and three kids in two story home      Right handed      Highest level of edu- bachelors   Past Surgical History:  Procedure Laterality Date  . DILATION AND CURETTAGE OF UTERUS     Past Medical History:  Diagnosis Date  . Anxiety   . Depression   . ICH (intracerebral hemorrhage) ([place])   . Seizures ([place])   . Stroke (HCC)    BP 102/65   Pulse 69   Temp 98 F (36.7 C)   Ht 5\' 3"  (1.6 m)   Wt 124 lb (56.2 kg)   SpO2 98%   BMI 21.97 kg/m   Opioid Risk Score:   Fall Risk Score:  `1  Depression screen PHQ 2/9  Depression screen [facility] 2/9 03/25/2019  08/20/2017 05/04/2017  Decreased Interest 1 1 3   Down, Depressed, Hopeless 1 1 3   PHQ - 2 Score 2 2 6   Some recent data might be hidden     Review of Systems  Constitutional: Negative.   HENT: Negative.   Eyes: Negative.   Respiratory: Negative.   Cardiovascular: Negative.   Gastrointestinal: Positive for constipation.  Endocrine: Negative.   Genitourinary: Negative.   Musculoskeletal: Positive for gait problem.  Skin: Negative.   Allergic/Immunologic: Negative.   Neurological: Positive for numbness.  Hematological: Negative.   Psychiatric/Behavioral: Positive for dysphoric mood. The patient is nervous/anxious.   All other systems reviewed and are negative.      Objective:   Physical Exam Vitals signs and nursing note reviewed.  HENT:     Mouth/Throat:     Mouth: Mucous membranes are dry.  Eyes:     Extraocular Movements: Extraocular movements intact.     Conjunctiva/sclera: Conjunctivae normal.     Pupils: Pupils are equal, round, and reactive to light.  Neurological:     Mental Status: She is alert and oriented to  person, place, and time.     Comments: Tone MAS 2 posterior tibialis MAS 2 FHL At MAS 2 FDL With standing there is toe curling in all 5 toes.   Psychiatric:        Mood and Affect: Mood normal.        Behavior: Behavior normal.   No evidence of clonus at the ankle. Motor strength is 4/5 in the right hip flexor knee extensor and to minus right ankle dorsiflexor 3- ankle plantar flexor 0 at the toe flexors and extensors Gait without assistive device uses a right AFO there is some mild knee instability.  Patient without evidence of circumduction.  No knee buckling noted. With the brace off there is evidence of equina varus spasticity as well as toe curling during gait.       Assessment & Plan:  1.  Right spastic hemiplegia post CVA affecting the right lower extremity greater than right upper extremity. Overall did better with Xeomin then the Botox.  Will increase from 300 units to 400 units 7/28 Post tib 75U Gastroc 150U FDL 100 FHL 75

## 2019-06-24 ENCOUNTER — Ambulatory Visit: Payer: Medicare Other | Admitting: Physical Therapy

## 2019-06-24 ENCOUNTER — Other Ambulatory Visit: Payer: Self-pay

## 2019-06-24 ENCOUNTER — Encounter: Payer: Self-pay | Admitting: Physical Therapy

## 2019-06-24 DIAGNOSIS — M6281 Muscle weakness (generalized): Secondary | ICD-10-CM | POA: Diagnosis not present

## 2019-06-24 DIAGNOSIS — I69851 Hemiplegia and hemiparesis following other cerebrovascular disease affecting right dominant side: Secondary | ICD-10-CM

## 2019-06-24 DIAGNOSIS — R2681 Unsteadiness on feet: Secondary | ICD-10-CM

## 2019-06-24 DIAGNOSIS — M25671 Stiffness of right ankle, not elsewhere classified: Secondary | ICD-10-CM | POA: Diagnosis not present

## 2019-06-24 DIAGNOSIS — R293 Abnormal posture: Secondary | ICD-10-CM | POA: Diagnosis not present

## 2019-06-24 DIAGNOSIS — R296 Repeated falls: Secondary | ICD-10-CM

## 2019-06-24 DIAGNOSIS — M25611 Stiffness of right shoulder, not elsewhere classified: Secondary | ICD-10-CM | POA: Diagnosis not present

## 2019-06-24 NOTE — Therapy (Signed)
[facility] [address] [place], [place], [address] Phone: [phone]   Fax:  [phone]  Physical Therapy Treatment  Patient Details  Name: Ashley [name] MRN: [id number] Date of Birth: 1975-12-27 Referring Provider (PT): Marton Redwood, MD   Encounter Date: 06/24/2019  PT End of Session - 06/24/19 1436    Visit Number  12    Number of Visits  17    Date for PT Re-Evaluation  07/09/19    Authorization Type  Medicare and [place] - 10th visit PN required.  30 VL for OT; 30 VL for PT - 3 used    Authorization - Visit Number  15    Authorization - Number of Visits  30    PT Start Time  1020    PT Stop Time  1105    PT Time Calculation (min)  45 min    Equipment Utilized During Treatment  Other (comment)   Bioness   Activity Tolerance  Patient tolerated treatment well    Behavior During Therapy  WFL for tasks assessed/performed       Past Medical History:  Diagnosis Date  . Anxiety   . Depression   . ICH (intracerebral hemorrhage) ([place])   . Seizures ([place])   . Stroke [facility])     Past Surgical History:  Procedure Laterality Date  . DILATION AND CURETTAGE OF UTERUS      There were no vitals filed for this visit.  Subjective Assessment - 06/24/19 1029    Subjective  Had appointment with Dr. Letta Pate to follow up on injections; plans to inject toe flexor next time.    Pertinent History  : L CVA with ICH, R hemiplegia, contracture of R ankle muscles, R shoulder adhesive capsulitis, multiple falls with injury, seizures, depression and anxiety    Limitations  Standing;Walking    Patient Stated Goals  More stability, prevent falls, would like to be able to run                       [facility] Adult PT Treatment/Exercise - 06/24/19 1031      Ambulation/Gait   Ambulation/Gait  Yes    Ambulation/Gait Assistance  4: Min assist    Ambulation/Gait Assistance Details  gait with Bioness in gait mode with  therapist providing facilitation at pelvis to fully weight shift anteriorly and to the L and increasing stance time on LLE to allow for full RLE clearance and step length as pt tends to hit with foot flat instead of extending knee to heel strike.  When stance on LLE is prolonged pt able to advance RLE without hip hike and without foot flat.    Ambulation Distance (Feet)  115 Feet    Assistive device  None    Ambulation Surface  Level;Indoor      Modalities   Modalities  Teacher, English as a foreign language Location  R anterior tibialis and hamstring mm groups    Electrical Stimulation Action  open and closed chain ankle DF, knee flexion and hip extension    Electrical Stimulation Parameters  See tablet 1 for parameters; steering electrode    Electrical Stimulation Goals  Strength;Tone;Neuromuscular facilitation      Ankle Exercises: Stretches   Other Stretch  on ankle rocker performing gastroc stretch x 5 reps bilaterally with knee straight focusing on bringing hips forwards and then performed 5 more reps of coming into a squat on  ankle rocker keeping R pelvis rotated forwards and weight back on heels and holding 10-20 seconds with UE support          Balance Exercises - 06/24/19 1431      Balance Exercises: Standing   Rockerboard  Anterior/posterior;EO;10 reps;Intermittent UE support    Other Standing Exercises  With Bioness in training mode: performed large staggered stance with L foot forwards performing anterior posterior weight shifts gradually decreasing UE support to no UE support with min A.  Transitioned to one foot on small rockerboard to allow greater forwards and backwards weight shifting and ankle movement while stepping forwards and backwards over rockerboard with contralateral LE again progressing from bilat UE support > unilateral > no UE support.  Performed 10 reps each LE on board.  Progressed to holding swing leg in mid air for 3-4  seconds and releasing bars for SLS stability.  When stepping RLE over board therapist assisted with terminal knee extension and heel strike as pt tends to put foot down too soon and on mid foot.          PT Short Term Goals - 06/08/19 1551      PT SHORT TERM GOAL #1   Title  Patient will demonstrate independence with updated stretching and strengthening HEP    Time  4    Period  Weeks    Status  Achieved    Target Date  06/09/19      PT SHORT TERM GOAL #2   Title  Pt will participate in further assessment of falls risk during gait with gait velocity and FGA    Time  4    Period  Weeks    Status  Achieved    Target Date  06/09/19      PT SHORT TERM GOAL #3   Title  Pt will participate in assessment for new and more appropriate AFO    Time  4    Period  Weeks    Status  Achieved    Target Date  06/09/19      PT SHORT TERM GOAL #4   Title  Pt will tolerate initial set up and fitting of functional electrical stimulation and will begin ROM and gait training with Bioness on RLE (if cleared medically by physician)    Time  4    Period  Weeks    Status  Achieved    Target Date  06/09/19      PT SHORT TERM GOAL #5   Title  Pt will participate in assessment of RLE mechanics when performing exercise on elliptical    Time  4    Period  Weeks    Status  Achieved    Target Date  06/09/19        PT Long Term Goals - 05/18/19 1214      PT LONG TERM GOAL #1   Title  Pt will demonstrate independence with final HEP and improved RLE mechanics on elliptical when using for aerobic exercise (07/09/19)    Time  8    Period  Weeks    Status  New      PT LONG TERM GOAL #2   Title  Pt will demonstrate 3 point improvement in DGI to indicate decreased falls risk    Baseline  19/24    Time  8    Period  Weeks    Status  Revised      PT LONG TERM GOAL #3   Title  Pt will demonstrate improvement in gait velocity with new AFO to >/= 3.6 ft/sec    Baseline  3.19 with Bioness    Time  8     Period  Weeks    Status  Revised      PT LONG TERM GOAL #4   Title  Pt will demonstrate 5 deg improvement in passive or active R ankle eversion and 20 degree improvement in ankle DF to improve gait mechanics    Baseline  10 deg inversion, lacking 45 deg to neutral DF    Time  8    Period  Weeks    Status  New      PT LONG TERM GOAL #5   Title  Pt will demonstrate ability to don new AFO and will tolerate full day of wear    Time  8    Period  Weeks    Status  New            Plan - 06/24/19 1437    Clinical Impression Statement  Continued to utilize Bioness during dynamic weight shift training and gait sequence training on rockerboard to facilitate increased ankle and hip ROM.  Attempted to carry over to gait with therapist facilitating increased anterior and L weight shift to allow full clearance and heel strike of RLE.  Will continue to address in order to progress towards LTG.    Personal Factors and Comorbidities  Comorbidity 2;Time since onset of injury/illness/exacerbation;Age    Comorbidities  L CVA with ICH, R hemiplegia, contracture of R ankle muscles, R shoulder adhesive capsulitis, multiple falls with injury, seizures, depression and anxiety, pt is a mother to young children    Examination-Activity Limitations  Locomotion Level;Squat;Stairs;Stand;Caring for Others    Examination-Participation Restrictions  Cleaning;Shop;Community Activity    Stability/Clinical Decision Making  Evolving/Moderate complexity    Rehab Potential  Good    Clinical Impairments Affecting Rehab Potential  time since onset, mm contracture in R ankle    PT Frequency  2x / week    PT Duration  8 weeks    PT Treatment/Interventions  ADLs/Self Care Home Management;Electrical Stimulation;Gait training;Stair training;Functional mobility training;Therapeutic activities;Therapeutic exercise;Balance training;Neuromuscular re-education;Patient/family education;Orthotic Fit/Training;Passive range of  motion;Dry needling;Taping    PT Next Visit Plan  descending/step downs (also with Bioness); Weight shift fully L for full step length RLE;  Bioness with treadmill,  Stepping over obstacles, gait on pavement, balance reactions if foot catches.  Bioness and use with elliptical; sit > stand bringing weight forwards over foot; closed chain hip ABD strengthening, continue intense stretching program for RLE - gastroc, posterior tib, hamstring, hip ER, hip flexor.  RLE strengthening - RLE PNF patterns into IR/extension; especially hip EXT and ABD/IR  Balance training - rockerboard. Stair Industrial/product designer with Plan of Care  Patient       Patient will benefit from skilled therapeutic intervention in order to improve the following deficits and impairments:  Abnormal gait, Decreased balance, Decreased mobility, Decreased range of motion, Decreased strength, Difficulty walking, Hypomobility, Increased muscle spasms, Impaired flexibility, Impaired tone, Impaired sensation  Visit Diagnosis: Unsteadiness on feet  Spastic hemiplegia of right dominant side as late effect of other cerebrovascular disease (HCC)  Repeated falls  Muscle weakness (generalized)  Stiffness of right ankle, not elsewhere classified     Problem List Patient Active Problem List   Diagnosis Date Noted  . Depression 03/23/2017  . Anxiety 03/23/2017  . Muscle weakness 02/03/2017  . History  of hemorrhagic stroke with residual hemiparesis ([place]) 12/10/2016  . Spastic hemiplegia and hemiparesis affecting dominant side ([place]) 09/13/2015  . Adhesive capsulitis of right shoulder 08/24/2015  . Localization-related symptomatic epilepsy and epileptic syndromes with simple partial seizures, not intractable, without status epilepticus ([place]) 08/21/2015  . Nontraumatic cortical hemorrhage of cerebral hemisphere ([place])   . Seizure ([place])   . Seizures ([place])   . Adjustment disorder with depressed mood   . Contracture of muscle  ankle and foot 06/11/2015  . Hemiplga fol ntrm intcrbl hemor aff right dominant side ([place]) 06/06/2015  . Left-sided intracerebral hemorrhage ([place]) 06/04/2015  . Seizure disorder as sequela of cerebrovascular accident ([place])   . Seizure disorder ([place]) 06/03/2015  . Sepsis ([place]) 06/03/2015  . UTI (urinary tract infection) 06/03/2015  . Hypotension 06/02/2015  . Right spastic hemiparesis ([place]) 05/28/2015  . Aphasia following nontraumatic intracerebral hemorrhage 05/28/2015  . History of anxiety disorder 05/28/2015  . ICH (intracerebral hemorrhage) ([place])   . Seizure disorder, nonconvulsive, with status epilepticus ([place])   . Cerebral venous thrombosis of cortical vein   . Cytotoxic cerebral edema ([place])   . IVH (intraventricular hemorrhage) ([place])   . Term pregnancy 05/07/2015  . Spontaneous vaginal delivery 05/07/2015    [name], PT, DPT 06/24/19    2:41 PM    [facility] [address] [place], [place], [address] Phone: [phone]   Fax:  [phone]  Name: Ashley [name] MRN: [id number] Date of Birth: May 22, 1976

## 2019-06-29 ENCOUNTER — Ambulatory Visit: Payer: Medicare Other | Admitting: Physical Therapy

## 2019-06-29 ENCOUNTER — Encounter: Payer: Self-pay | Admitting: Physical Therapy

## 2019-06-29 ENCOUNTER — Other Ambulatory Visit: Payer: Self-pay

## 2019-06-29 ENCOUNTER — Ambulatory Visit: Payer: Medicare Other | Admitting: Occupational Therapy

## 2019-06-29 DIAGNOSIS — M6281 Muscle weakness (generalized): Secondary | ICD-10-CM | POA: Diagnosis not present

## 2019-06-29 DIAGNOSIS — M25611 Stiffness of right shoulder, not elsewhere classified: Secondary | ICD-10-CM | POA: Diagnosis not present

## 2019-06-29 DIAGNOSIS — I69851 Hemiplegia and hemiparesis following other cerebrovascular disease affecting right dominant side: Secondary | ICD-10-CM

## 2019-06-29 DIAGNOSIS — R2681 Unsteadiness on feet: Secondary | ICD-10-CM

## 2019-06-29 DIAGNOSIS — R296 Repeated falls: Secondary | ICD-10-CM

## 2019-06-29 DIAGNOSIS — M25671 Stiffness of right ankle, not elsewhere classified: Secondary | ICD-10-CM

## 2019-06-29 DIAGNOSIS — R293 Abnormal posture: Secondary | ICD-10-CM | POA: Diagnosis not present

## 2019-06-29 NOTE — Therapy (Signed)
[facility] [address] [place], [place], [address] Phone: [phone]   Fax:  [phone]  Physical Therapy Treatment  Patient Details  Name: Ashley [name] MRN: [id number] Date of Birth: April 15, 1976 Referring Provider (PT): Marton Redwood, MD   Encounter Date: 06/29/2019  PT End of Session - 06/29/19 1745    Visit Number  13    Number of Visits  17    Date for PT Re-Evaluation  07/09/19    Authorization Type  Medicare and [place] - 10th visit PN required.  30 VL for OT; 30 VL for PT - 3 used    Authorization - Visit Number  16    Authorization - Number of Visits  30    PT Start Time  1020    PT Stop Time  1105    PT Time Calculation (min)  45 min    Equipment Utilized During Treatment  Other (comment)   Bioness   Activity Tolerance  Patient tolerated treatment well    Behavior During Therapy  WFL for tasks assessed/performed       Past Medical History:  Diagnosis Date  . Anxiety   . Depression   . ICH (intracerebral hemorrhage) ([facility])   . Seizures ([place])   . Stroke [facility])     Past Surgical History:  Procedure Laterality Date  . DILATION AND CURETTAGE OF UTERUS      There were no vitals filed for this visit.  Subjective Assessment - 06/29/19 1227    Subjective  Still having significant difficulty getting posterior strap of T-strap to attach to velcro.  Velcro addition to posterior post not staying in place.    Pertinent History  : L CVA with ICH, R hemiplegia, contracture of R ankle muscles, R shoulder adhesive capsulitis, multiple falls with injury, seizures, depression and anxiety    Limitations  Standing;Walking    Patient Stated Goals  More stability, prevent falls, would like to be able to run                       [facility] Adult PT Treatment/Exercise - 06/29/19 1733      Ambulation/Gait   Ambulation/Gait  Yes    Ambulation/Gait Assistance  4: Min assist    Ambulation/Gait Assistance  Details  Bioness in gait mode - attempted to carry over NMR In // bars with therapist assisting with L lateral weight shift to allow pt to perform knee extension during terminal swing for heel strike.  Pt continues to utilize hip hike with ER and adduction.    Ambulation Distance (Feet)  115 Feet    Assistive device  None    Ambulation Surface  Level;Indoor      Neuro Re-ed    Neuro Re-ed Details   Bioness in training mode: NMR in // bars to continue to focus on gait sequencing.  Attempted to have pt swing through with RLE and push BOSU during terminal swing through toes with knee extension and quad activation.  Pt consistently externally rotated RLE and activated with adductors.  Changed to performing knee extension to place heel on top of BOSU.  Continued to require significant assistance and facilitation and UE support.        Knee/Hip Exercises: Aerobic   Tread Mill  Bioness in gait mode: Attempted to perform swing phase only on treadmill with L foot positioned off treadmill and RLE performing swing phase focusing on terminal hip extension and intiating swing with hip  and knee flexion and pelvic rotation and increasing stance time on LLE.  Pt unable to safely sequence and demonstrated significant increase in trunk flexion and increased tone in RLE with steppage gait.  Changed to walking on treadmill with both LE and both UE for support at 0.6 mph x 5 minutes with therapist providing facilitation at RLE for faster knee flexion at terminal swing for improved toe clearance and knee extension at terminal swing to improve heel strike.  Also required assistance for pelvic rotation and weight shifting to LLE during R swing.  Significant scissoring noted when therapist removed assistance.  Changed Bioness gait setting to initiate hamstring sooner during terminal stance and delayed activation until heel strike.        Modalities   Modalities  Electrical engineer  Stimulation Location  R anterior tibialis and hamstring mm groups    Electrical Stimulation Action  open and closed chain ankle DF, knee flexion and hip extension    Electrical Stimulation Parameters  See tablet 1 for parameters; steering electrode    Electrical Stimulation Goals  Strength;Tone;Neuromuscular facilitation             PT Education - 06/29/19 1743    Education provided  Yes    Education Details  Pt to go to [employer] tomorrow to allow orthotist to add velcro to AFO to improve patient independence with fastening strap and maintain tension in T strap.  Pt to go tomorrow after PT.    Person(s) Educated  Patient    Methods  Explanation    Comprehension  Verbalized understanding       PT Short Term Goals - 06/08/19 1551      PT SHORT TERM GOAL #1   Title  Patient will demonstrate independence with updated stretching and strengthening HEP    Time  4    Period  Weeks    Status  Achieved    Target Date  06/09/19      PT SHORT TERM GOAL #2   Title  Pt will participate in further assessment of falls risk during gait with gait velocity and FGA    Time  4    Period  Weeks    Status  Achieved    Target Date  06/09/19      PT SHORT TERM GOAL #3   Title  Pt will participate in assessment for new and more appropriate AFO    Time  4    Period  Weeks    Status  Achieved    Target Date  06/09/19      PT SHORT TERM GOAL #4   Title  Pt will tolerate initial set up and fitting of functional electrical stimulation and will begin ROM and gait training with Bioness on RLE (if cleared medically by physician)    Time  4    Period  Weeks    Status  Achieved    Target Date  06/09/19      PT SHORT TERM GOAL #5   Title  Pt will participate in assessment of RLE mechanics when performing exercise on elliptical    Time  4    Period  Weeks    Status  Achieved    Target Date  06/09/19        PT Long Term Goals - 05/18/19 1214      PT LONG TERM GOAL #1   Title  Pt  will demonstrate  independence with final HEP and improved RLE mechanics on elliptical when using for aerobic exercise (07/09/19)    Time  8    Period  Weeks    Status  New      PT LONG TERM GOAL #2   Title  Pt will demonstrate 3 point improvement in DGI to indicate decreased falls risk    Baseline  19/24    Time  8    Period  Weeks    Status  Revised      PT LONG TERM GOAL #3   Title  Pt will demonstrate improvement in gait velocity with new AFO to >/= 3.6 ft/sec    Baseline  3.19 with Bioness    Time  8    Period  Weeks    Status  Revised      PT LONG TERM GOAL #4   Title  Pt will demonstrate 5 deg improvement in passive or active R ankle eversion and 20 degree improvement in ankle DF to improve gait mechanics    Baseline  10 deg inversion, lacking 45 deg to neutral DF    Time  8    Period  Weeks    Status  New      PT LONG TERM GOAL #5   Title  Pt will demonstrate ability to don new AFO and will tolerate full day of wear    Time  8    Period  Weeks    Status  New            Plan - 06/29/19 1745    Clinical Impression Statement  Continued use of Bioness with NMR and gait training on treadmill and then over ground.  Pt continues to over utilize hip hike, ADD and ER to advance RLE but is demonstrating improved closed chain ankle DF and advancement over R foot during stance.  Will continue to address in order to progress towards LTG.    Personal Factors and Comorbidities  Comorbidity 2;Time since onset of injury/illness/exacerbation;Age    Comorbidities  L CVA with ICH, R hemiplegia, contracture of R ankle muscles, R shoulder adhesive capsulitis, multiple falls with injury, seizures, depression and anxiety, pt is a mother to young children    Examination-Activity Limitations  Locomotion Level;Squat;Stairs;Stand;Caring for Others    Examination-Participation Restrictions  Cleaning;Shop;Community Activity    Stability/Clinical Decision Making  Evolving/Moderate complexity     Rehab Potential  Good    Clinical Impairments Affecting Rehab Potential  time since onset, mm contracture in R ankle    PT Frequency  2x / week    PT Duration  8 weeks    PT Treatment/Interventions  ADLs/Self Care Home Management;Electrical Stimulation;Gait training;Stair training;Functional mobility training;Therapeutic activities;Therapeutic exercise;Balance training;Neuromuscular re-education;Patient/family education;Orthotic Fit/Training;Passive range of motion;Dry needling;Taping    PT Next Visit Plan  Remind her to go to [employer] after PT.  descending/step downs (also with Bioness); Weight shift fully L for full step length RLE - needs to activate quad during terminal swing;  Bioness with treadmill,  Stepping over obstacles, gait on pavement, balance reactions if foot catches.  Bioness and use with elliptical; sit > stand bringing weight forwards over foot; closed chain hip ABD strengthening, continue intense stretching program for RLE - gastroc, posterior tib, hamstring, hip ER, hip flexor.  RLE strengthening - RLE PNF patterns into IR/extension; especially hip EXT and ABD/IR  Balance training - rockerboard. Stair Industrial/product designer with Plan of Care  Patient  Patient will benefit from skilled therapeutic intervention in order to improve the following deficits and impairments:  Abnormal gait, Decreased balance, Decreased mobility, Decreased range of motion, Decreased strength, Difficulty walking, Hypomobility, Increased muscle spasms, Impaired flexibility, Impaired tone, Impaired sensation  Visit Diagnosis: Unsteadiness on feet  Spastic hemiplegia of right dominant side as late effect of other cerebrovascular disease (HCC)  Repeated falls  Muscle weakness (generalized)  Stiffness of right ankle, not elsewhere classified     Problem List Patient Active Problem List   Diagnosis Date Noted  . Depression 03/23/2017  . Anxiety 03/23/2017  . Muscle weakness  02/03/2017  . History of hemorrhagic stroke with residual hemiparesis ([place]) 12/10/2016  . Spastic hemiplegia and hemiparesis affecting dominant side ([place] [facility]) 09/13/2015  . Adhesive capsulitis of right shoulder 08/24/2015  . Localization-related symptomatic epilepsy and epileptic syndromes with simple partial seizures, not intractable, without status epilepticus ([place]) 08/21/2015  . Nontraumatic cortical hemorrhage of cerebral hemisphere ([place])   . Seizure ([place])   . Seizures ([place])   . Adjustment disorder with depressed mood   . Contracture of muscle ankle and foot 06/11/2015  . Hemiplga fol ntrm intcrbl hemor aff right dominant side ([place]) 06/06/2015  . Left-sided intracerebral hemorrhage ([place]) 06/04/2015  . Seizure disorder as sequela of cerebrovascular accident ([place])   . Seizure disorder ([place]) 06/03/2015  . Sepsis ([place]) 06/03/2015  . UTI (urinary tract infection) 06/03/2015  . Hypotension 06/02/2015  . Right spastic hemiparesis ([place]) 05/28/2015  . Aphasia following nontraumatic intracerebral hemorrhage 05/28/2015  . History of anxiety disorder 05/28/2015  . ICH (intracerebral hemorrhage) ([facility])   . Seizure disorder, nonconvulsive, with status epilepticus ([facility] [facility])   . Cerebral venous thrombosis of cortical vein   . Cytotoxic cerebral edema ([place])   . IVH (intraventricular hemorrhage) ([place])   . Term pregnancy 05/07/2015  . Spontaneous vaginal delivery 05/07/2015    [name], PT, DPT 06/29/19    5:49 PM    [facility] [address] [place], [place], [address] Phone: [phone]   Fax:  [phone]  Name: Tynlee Hooey MRN: [id number] Date of Birth: 11-01-1975

## 2019-06-30 ENCOUNTER — Ambulatory Visit: Payer: Medicare Other | Admitting: Physical Therapy

## 2019-06-30 ENCOUNTER — Encounter: Payer: Self-pay | Admitting: Physical Therapy

## 2019-06-30 DIAGNOSIS — M6281 Muscle weakness (generalized): Secondary | ICD-10-CM | POA: Diagnosis not present

## 2019-06-30 DIAGNOSIS — R2681 Unsteadiness on feet: Secondary | ICD-10-CM | POA: Diagnosis not present

## 2019-06-30 DIAGNOSIS — M25671 Stiffness of right ankle, not elsewhere classified: Secondary | ICD-10-CM | POA: Diagnosis not present

## 2019-06-30 DIAGNOSIS — R296 Repeated falls: Secondary | ICD-10-CM

## 2019-06-30 DIAGNOSIS — J181 Lobar pneumonia, unspecified organism: Secondary | ICD-10-CM | POA: Diagnosis not present

## 2019-06-30 DIAGNOSIS — I69851 Hemiplegia and hemiparesis following other cerebrovascular disease affecting right dominant side: Secondary | ICD-10-CM

## 2019-06-30 DIAGNOSIS — M25611 Stiffness of right shoulder, not elsewhere classified: Secondary | ICD-10-CM | POA: Diagnosis not present

## 2019-06-30 DIAGNOSIS — R293 Abnormal posture: Secondary | ICD-10-CM | POA: Diagnosis not present

## 2019-06-30 NOTE — Patient Instructions (Signed)
Gastroc / Heel Cord Stretch - On Step    Stand with heels over edge of stair. Holding rail, lower heels until stretch is felt in calf of legs. Repeat _3__ times. Do _2__ times per day.   Step-Down: Forward    Stand on first step and lower left leg as far as possible to floor until you feel a stretch in your right calf.  Try to keep your knee pointing forwards as much as you can.  Hold for 30 seconds and then rest. Perform 5 repetitions, 2 times a day.    Alternating Hamstring Curl    Stand on both legs, one hand touching the counter top.  Inhaling, shift weight onto left leg. Exhaling, bend right leg, heel toward buttock then inhale and placing right leg down, shift weight onto right leg. Repeat with other leg. Repeat _10__ times, alternating legs. Do _2__ times per day.  Focus on keeping your posture up tall and don't let the knee drift too far forwards.   Wall Squat    Feet shoulder width apart, squeezing a yoga block between the knees, step feet forwards - _12___ inches in front of wall, lean against wall. Heels on floor, knees parallel, bend hips and knees to almost 90. Hold _10___ seconds and then return to standing.  Keep thighs squeezing yoga block the whole time. Repeat __6-8__ times. Do __1-2__ sessions per day.   HIP / KNEE: Extension - Modified Sit to Stand    Place hands on chair in front of you. Lean forward, raise hips up from surface - keep knees over feet and pause for a couple of seconds. Straighten hips and knees. Weight bear equally on left and right sides. _10__ reps per set, __2_ sets per day.   Hip Internal Rotation (Eccentric) - Prone, Knees Flexed to 90 Degrees    Lie on stomach with Right knee flexed to 90. Have a belt around your foot and pulling on outside of foot.  Slowly lower legs outward and heel towards buttocks x 30 seconds. Relax and then perform again. _4__ reps per set, __2_ sets per day, __7_ days per week.   Outer Hip Stretch:  Reclined IT Band Stretch (Strap)    Strap around opposite foot, pull across only as far as possible with shoulders on mat. Hold for __10__ breaths. Repeat ___2_ times    Knee Extension: Standing (Single Leg)    In stride stance, anchor tubing around table leg and loop around ankle of right foot. Have left leg positioned forwards.  Bring right knee up in the air and kick foot forwards with control placing heel on floor.  Bring leg back with control. Repeat _10_ times per set. Repeat with other leg. Do _2_ sets per session. Do _5_ sessions per week.

## 2019-06-30 NOTE — Therapy (Signed)
[facility] [address] [place], [place], [address] Phone: [phone]   Fax:  [phone]  Physical Therapy Treatment  Patient Details  Name: Ashley [name] MRN: [id number] Date of Birth: 1976/10/12 Referring Provider (PT): Marton Redwood, MD   Encounter Date: 06/30/2019  PT End of Session - 06/30/19 [id number]    Visit Number  14    Number of Visits  17    Date for PT Re-Evaluation  07/09/19    Authorization Type  Medicare and [place] - 10th visit PN required.  30 VL for OT; 30 VL for PT - 3 used    Authorization - Visit Number  17    Authorization - Number of Visits  30    PT Start Time  1017    PT Stop Time  1101    PT Time Calculation (min)  44 min    Equipment Utilized During Treatment  Other (comment)   Bioness   Activity Tolerance  Patient tolerated treatment well    Behavior During Therapy  WFL for tasks assessed/performed       Past Medical History:  Diagnosis Date  . Anxiety   . Depression   . ICH (intracerebral hemorrhage) ([place])   . Seizures ([place])   . Stroke [facility])     Past Surgical History:  Procedure Laterality Date  . DILATION AND CURETTAGE OF UTERUS      There were no vitals filed for this visit.  Subjective Assessment - 06/30/19 1047    Subjective  Going to [employer] today to have strap fixed.  Stil having great toe pain on R.    Pertinent History  : L CVA with ICH, R hemiplegia, contracture of R ankle muscles, R shoulder adhesive capsulitis, multiple falls with injury, seizures, depression and anxiety    Limitations  Standing;Walking    Patient Stated Goals  More stability, prevent falls, would like to be able to run                       [facility] Adult PT Treatment/Exercise - 06/30/19 1047      Ambulation/Gait   Ambulation/Gait  Yes    Ambulation/Gait Assistance  4: Min assist    Ambulation/Gait Assistance Details  Bioness in gait mode but with thigh cuff on quad instead of  hamstring to facilitate knee extension during terminal swing and increasing stance time on LLE; pt experienced intermittent scissoring and LOB    Ambulation Distance (Feet)  115 Feet    Assistive device  None    Ambulation Surface  Level;Indoor    Stairs  Yes    Stairs Assistance  4: Min assist    Stairs Assistance Details (indicate cue type and reason)  with Bioness in gait mode focusing on ascending with full R hip flexion and weight shift forwards onto RLE with hip and knee extension to advance to next step and then when descending shifting weight forwards and rotating pelvis anteriorly on R side to decrease hip ER during R stance, descending LLE.  Pt demonstrated improved technique/sequencing and decreased pulling through UE    Stair Management Technique  One rail Left;Alternating pattern;Forwards    Number of Stairs  12    Height of Stairs  6    Gait Comments  Also demonstrated gait with simulated leather toe cap without Bioness allowing toe to catch on ground to let pt see how toe cap decreases friction and risk for fall  Neuro Re-ed    Neuro Re-ed Details   Bioness in training mode: performed RLE swing phase heel taps to 6" step in front with Bioness on quad to facilitate hip flexion and knee extension during terminal swing.  Performed x 10 reps followed by stair negotiation.      Exercises   Exercises  Knee/Hip      Knee/Hip Exercises: Standing   Terminal Knee Extension  Strengthening;Right;1 set;10 reps;Theraband    Theraband Level (Terminal Knee Extension)  Level 2 (Red)    Terminal Knee Extension Limitations  theraband around RLE swinging RLE forwards with hip flexion > knee extension and then back with control to continue to reinforce quad activation during terminal swing       Modalities   Modalities  Electrical Stimulation      Electrical Stimulation   Electrical Stimulation Location  R anterior tib and R quad    Electrical Stimulation Action  open and closed chain ankle  DF, knee extension and hip flexion    Electrical Stimulation Parameters  See tablet 1 for parameters; steering electrode    Electrical Stimulation Goals  Strength;Tone;Neuromuscular facilitation             PT Education - 06/30/19 1638    Education provided  Yes    Education Details  knee extension exercise; go to [employer] to adjust T strap, discuss leather toe cap with orthotist    Person(s) Educated  Patient    Methods  Explanation;Demonstration    Comprehension  Verbalized understanding;Returned demonstration       PT Short Term Goals - 06/08/19 1551      PT SHORT TERM GOAL #1   Title  Patient will demonstrate independence with updated stretching and strengthening HEP    Time  4    Period  Weeks    Status  Achieved    Target Date  06/09/19      PT SHORT TERM GOAL #2   Title  Pt will participate in further assessment of falls risk during gait with gait velocity and FGA    Time  4    Period  Weeks    Status  Achieved    Target Date  06/09/19      PT SHORT TERM GOAL #3   Title  Pt will participate in assessment for new and more appropriate AFO    Time  4    Period  Weeks    Status  Achieved    Target Date  06/09/19      PT SHORT TERM GOAL #4   Title  Pt will tolerate initial set up and fitting of functional electrical stimulation and will begin ROM and gait training with Bioness on RLE (if cleared medically by physician)    Time  4    Period  Weeks    Status  Achieved    Target Date  06/09/19      PT SHORT TERM GOAL #5   Title  Pt will participate in assessment of RLE mechanics when performing exercise on elliptical    Time  4    Period  Weeks    Status  Achieved    Target Date  06/09/19        PT Long Term Goals - 05/18/19 1214      PT LONG TERM GOAL #1   Title  Pt will demonstrate independence with final HEP and improved RLE mechanics on elliptical when using for aerobic exercise (07/09/19)    Time  8    Period  Weeks    Status  New      PT LONG  TERM GOAL #2   Title  Pt will demonstrate 3 point improvement in DGI to indicate decreased falls risk    Baseline  19/24    Time  8    Period  Weeks    Status  Revised      PT LONG TERM GOAL #3   Title  Pt will demonstrate improvement in gait velocity with new AFO to >/= 3.6 ft/sec    Baseline  3.19 with Bioness    Time  8    Period  Weeks    Status  Revised      PT LONG TERM GOAL #4   Title  Pt will demonstrate 5 deg improvement in passive or active R ankle eversion and 20 degree improvement in ankle DF to improve gait mechanics    Baseline  10 deg inversion, lacking 45 deg to neutral DF    Time  8    Period  Weeks    Status  New      PT LONG TERM GOAL #5   Title  Pt will demonstrate ability to don new AFO and will tolerate full day of wear    Time  8    Period  Weeks    Status  New            Plan - 06/30/19 1639    Clinical Impression Statement  Changed Bioness today to R quad muscle to address decreased knee extension during terminal swing.  Utilized during gait, NMR and stair negotiation.  Pt continues to be very worried about catching her R toe; educated pt on use of leather toe cap to reduce friction and falls risk; pt to discuss with orthotist today.  Provided pt with therband knee extension exercise to perform at home.  Will continue to address in order to progress towards LTG.    Personal Factors and Comorbidities  Comorbidity 2;Time since onset of injury/illness/exacerbation;Age    Comorbidities  L CVA with ICH, R hemiplegia, contracture of R ankle muscles, R shoulder adhesive capsulitis, multiple falls with injury, seizures, depression and anxiety, pt is a mother to young children    Examination-Activity Limitations  Locomotion Level;Squat;Stairs;Stand;Caring for Others    Examination-Participation Restrictions  Cleaning;Shop;Community Activity    Stability/Clinical Decision Making  Evolving/Moderate complexity    Rehab Potential  Good    Clinical Impairments  Affecting Rehab Potential  time since onset, mm contracture in R ankle    PT Frequency  2x / week    PT Duration  8 weeks    PT Treatment/Interventions  ADLs/Self Care Home Management;Electrical Stimulation;Gait training;Stair training;Functional mobility training;Therapeutic activities;Therapeutic exercise;Balance training;Neuromuscular re-education;Patient/family education;Orthotic Fit/Training;Passive range of motion;Dry needling;Taping    PT Next Visit Plan  Did she talk to [facility] about leather toe cap?  descending/step downs (also with Bioness); Weight shift fully L for full step length RLE - needs to activate quad during terminal swing;  Bioness with treadmill,  Stepping over obstacles, gait on pavement, balance reactions if foot catches.  Bioness and use with elliptical; sit > stand bringing weight forwards over foot; closed chain hip ABD strengthening, continue intense stretching program for RLE - gastroc, posterior tib, hamstring, hip ER, hip flexor.  RLE strengthening - RLE PNF patterns into IR/extension; especially hip EXT and ABD/IR  Balance training - rockerboard.    Consulted and Agree with Plan of Care  Patient  Patient will benefit from skilled therapeutic intervention in order to improve the following deficits and impairments:  Abnormal gait, Decreased balance, Decreased mobility, Decreased range of motion, Decreased strength, Difficulty walking, Hypomobility, Increased muscle spasms, Impaired flexibility, Impaired tone, Impaired sensation  Visit Diagnosis: Unsteadiness on feet  Spastic hemiplegia of right dominant side as late effect of other cerebrovascular disease (HCC)  Repeated falls  Muscle weakness (generalized)  Stiffness of right ankle, not elsewhere classified     Problem List Patient Active Problem List   Diagnosis Date Noted  . Depression 03/23/2017  . Anxiety 03/23/2017  . Muscle weakness 02/03/2017  . History of hemorrhagic stroke with residual  hemiparesis ([place]) 12/10/2016  . Spastic hemiplegia and hemiparesis affecting dominant side ([place]) 09/13/2015  . Adhesive capsulitis of right shoulder 08/24/2015  . Localization-related symptomatic epilepsy and epileptic syndromes with simple partial seizures, not intractable, without status epilepticus ([place]) 08/21/2015  . Nontraumatic cortical hemorrhage of cerebral hemisphere ([place])   . Seizure ([place])   . Seizures ([place])   . Adjustment disorder with depressed mood   . Contracture of muscle ankle and foot 06/11/2015  . Hemiplga fol ntrm intcrbl hemor aff right dominant side ([place]) 06/06/2015  . Left-sided intracerebral hemorrhage ([place]) 06/04/2015  . Seizure disorder as sequela of cerebrovascular accident ([place])   . Seizure disorder ([place]) 06/03/2015  . Sepsis ([place]) 06/03/2015  . UTI (urinary tract infection) 06/03/2015  . Hypotension 06/02/2015  . Right spastic hemiparesis ([place]) 05/28/2015  . Aphasia following nontraumatic intracerebral hemorrhage 05/28/2015  . History of anxiety disorder 05/28/2015  . ICH (intracerebral hemorrhage) ([place])   . Seizure disorder, nonconvulsive, with status epilepticus ([place])   . Cerebral venous thrombosis of cortical vein   . Cytotoxic cerebral edema ([place])   . IVH (intraventricular hemorrhage) ([place])   . Term pregnancy 05/07/2015  . Spontaneous vaginal delivery 05/07/2015    [name], PT, DPT 06/30/19    4:44 PM    [facility] [address] [place], [place], [address] Phone: [phone]   Fax:  [phone]  Name: Ashley [name] MRN: [id number] Date of Birth: 27-Nov-1975

## 2019-07-06 ENCOUNTER — Ambulatory Visit: Payer: Medicare Other | Admitting: Physical Therapy

## 2019-07-06 ENCOUNTER — Ambulatory Visit: Payer: Medicare Other | Admitting: Occupational Therapy

## 2019-07-06 ENCOUNTER — Encounter: Payer: Self-pay | Admitting: Occupational Therapy

## 2019-07-06 ENCOUNTER — Other Ambulatory Visit: Payer: Self-pay

## 2019-07-06 ENCOUNTER — Encounter: Payer: Self-pay | Admitting: Physical Therapy

## 2019-07-06 DIAGNOSIS — M25671 Stiffness of right ankle, not elsewhere classified: Secondary | ICD-10-CM

## 2019-07-06 DIAGNOSIS — M25611 Stiffness of right shoulder, not elsewhere classified: Secondary | ICD-10-CM | POA: Diagnosis not present

## 2019-07-06 DIAGNOSIS — M6281 Muscle weakness (generalized): Secondary | ICD-10-CM | POA: Diagnosis not present

## 2019-07-06 DIAGNOSIS — R296 Repeated falls: Secondary | ICD-10-CM

## 2019-07-06 DIAGNOSIS — R2681 Unsteadiness on feet: Secondary | ICD-10-CM

## 2019-07-06 DIAGNOSIS — R293 Abnormal posture: Secondary | ICD-10-CM | POA: Diagnosis not present

## 2019-07-06 DIAGNOSIS — I69851 Hemiplegia and hemiparesis following other cerebrovascular disease affecting right dominant side: Secondary | ICD-10-CM

## 2019-07-06 NOTE — Therapy (Addendum)
[facility] [address] [place], [place], [address] Phone: [phone]   Fax:  [phone]  Physical Therapy Treatment  Patient Details  Name: Ashley [name] MRN: [id number] Date of Birth: August 09, 1976 Referring Provider (PT): Marton Redwood, MD   Encounter Date: 07/06/2019  PT End of Session - 07/06/19 1118    Visit Number  15    Number of Visits  17    Date for PT Re-Evaluation  07/09/19    Authorization Type  Medicare and [place] - 10th visit PN required.  30 VL for OT; 30 VL for PT - 3 used    Authorization - Visit Number  18    Authorization - Number of Visits  30    PT Start Time  1017    PT Stop Time  1105    PT Time Calculation (min)  48 min    Equipment Utilized During Treatment  Other (comment)   simulated leather toe cap   Activity Tolerance  Patient tolerated treatment well    Behavior During Therapy  WFL for tasks assessed/performed       Past Medical History:  Diagnosis Date  . Anxiety   . Depression   . ICH (intracerebral hemorrhage) ([place])   . Seizures ([place])   . Stroke [facility])     Past Surgical History:  Procedure Laterality Date  . DILATION AND CURETTAGE OF UTERUS      There were no vitals filed for this visit.  Subjective Assessment - 07/06/19 1023    Subjective  Nothing new to report; went to daughter's soccer game this past weekend.  Went to [employer] where they added more velcro to T strap.  Also discussed leather toe cap - would be $50.  Still thinking about it.    Pertinent History  : L CVA with ICH, R hemiplegia, contracture of R ankle muscles, R shoulder adhesive capsulitis, multiple falls with injury, seizures, depression and anxiety    Limitations  Standing;Walking    Patient Stated Goals  More stability, prevent falls, would like to be able to run    Currently in Pain?  No/denies         [facility] PT Assessment - 07/06/19 1041      Assessment   Medical Diagnosis  late effects of L CVA -  R spasticity and gait abnormalities    Referring Provider (PT)  Marton Redwood, MD    Onset Date/Surgical Date  04/28/19    Hand Dominance  Right    Prior Therapy  pt received PT, OT and ST inpt rehab and outpatient       Precautions   Precautions  Fall    Precaution Comments  L CVA with ICH, R hemiplegia, contracture of R ankle muscles, R shoulder adhesive capsulitis, multiple falls with injury, seizures, depression and anxiety      Prior Function   Level of Independence  Independent    Vocation  On disability      Observation/Other Assessments   Focus on Therapeutic Outcomes (FOTO)   N/A      PROM   Overall PROM Comments  R ankle now rests in 5 deg inversion and able to actively evert to neutral.  Ankle DF: in sitting pt lacking 15 deg to neutral DF.  In standing pt lacking 10 deg to neutral DF.  Gained 30-35 deg of ankle DF ROM overall      Ambulation/Gait   Ambulation/Gait  Yes    Ambulation/Gait Assistance  5:  Supervision    Ambulation/Gait Assistance Details  with simulated toe cap outside over variety of surfaces, cement inclines, curbs, pavement, curb with grass, mulch and gravel.  Also performed changes in gait speed and stepping over large parking bumpers.  Two episodes of foot scuffing on R but did not catch on pavement.  Only one LOB when descending curb due to forward weight shift.  No episodes of catching foot on obstacle    Ambulation Distance (Feet)  500 Feet    Assistive device  None    Ambulation Surface  Unlevel;Outdoor;Paved;Gravel;Grass;Other (comment)   mulch   Stairs  Yes    Stairs Assistance  4: Min guard    Stairs Assistance Details (indicate cue type and reason)  when ascending pt attempted to perform without rail but had one anterior LOB; returned to using rail to ascend and descend.  Still requires cues to rotated R pelvis forwards when descending to prevent RLE ER    Stair Management Technique  One rail Left;Alternating pattern;Forwards    Number of Stairs   8    Height of Stairs  6    Ramp  5: Supervision    Ramp Details (indicate cue type and reason)  with simulated leather toe cap    Curb  5: Supervision    Curb Details (indicate cue type and reason)  with simulated leather toe cap    Gait Comments  Again discussed purpose of leather toe cap and discussed with pt trial AFO with new Brooks at home to determine if she would be able to don and wear AFO with Rolena Infante for a few days while [employer] are having the leather toe cap added.        Standardized Balance Assessment   Standardized Balance Assessment  10 meter walk test;Dynamic Gait Index    10 Meter Walk  8.53 seconds or 3.8 ft/sec      Dynamic Gait Index   Level Surface  Mild Impairment    Change in Gait Speed  Normal    Gait with Horizontal Head Turns  Normal    Gait with Vertical Head Turns  Normal    Gait and Pivot Turn  Normal    Step Over Obstacle  Normal    Step Around Obstacles  Normal    Steps  Mild Impairment    Total Score  22    DGI comment:  22/24                           PT Education - 07/06/19 1117    Education provided  Yes    Education Details  progress towards goals, gain in ankle ROM and balance, leather toe cap    Person(s) Educated  Patient    Methods  Explanation    Comprehension  Verbalized understanding       PT Short Term Goals - 06/08/19 1551      PT SHORT TERM GOAL #1   Title  Patient will demonstrate independence with updated stretching and strengthening HEP    Time  4    Period  Weeks    Status  Achieved    Target Date  06/09/19      PT SHORT TERM GOAL #2   Title  Pt will participate in further assessment of falls risk during gait with gait velocity and FGA    Time  4    Period  Weeks    Status  Achieved  Target Date  06/09/19      PT SHORT TERM GOAL #3   Title  Pt will participate in assessment for new and more appropriate AFO    Time  4    Period  Weeks    Status  Achieved    Target Date  06/09/19       PT SHORT TERM GOAL #4   Title  Pt will tolerate initial set up and fitting of functional electrical stimulation and will begin ROM and gait training with Bioness on RLE (if cleared medically by physician)    Time  4    Period  Weeks    Status  Achieved    Target Date  06/09/19      PT SHORT TERM GOAL #5   Title  Pt will participate in assessment of RLE mechanics when performing exercise on elliptical    Time  4    Period  Weeks    Status  Achieved    Target Date  06/09/19        PT Long Term Goals - 07/06/19 1118      PT LONG TERM GOAL #1   Title  Pt will demonstrate independence with final HEP and improved RLE mechanics on elliptical when using for aerobic exercise (07/09/19)    Time  8    Period  Weeks    Status  On-going      PT LONG TERM GOAL #2   Title  Pt will demonstrate 3 point improvement in DGI to indicate decreased falls risk    Baseline  22/24    Time  8    Period  Weeks    Status  Achieved      PT LONG TERM GOAL #3   Title  Pt will demonstrate improvement in gait velocity with new AFO to >/= 3.6 ft/sec    Baseline  3.8 with leather toe cap    Time  8    Period  Weeks    Status  Achieved      PT LONG TERM GOAL #4   Title  Pt will demonstrate 5 deg improvement in passive or active R ankle eversion and 20 degree improvement in ankle DF to improve gait mechanics    Baseline  improved eversion by 5 deg actively; improved DF by 30 deg    Time  8    Period  Weeks    Status  Achieved      PT LONG TERM GOAL #5   Title  Pt will demonstrate ability to don new AFO and will tolerate full day of wear    Time  8    Period  Weeks    Status  Achieved            Plan - 07/06/19 1120    Clinical Impression Statement  Initiated assessment of progress towards LTG.  Pt is making good progress and has demonstrated overall improvements in dynamic gait and standing balance as evidenced by increase in gait velocity and increase in DGI score.  Pt also demonstrates  improvement in active R ankle ROM to allow for improved gait mechanics and sequencing.  Will continue to check final LTG next session and plan to renew for 4 more weeks to continue to address impairments in balance reactions and weight shifting to continue to improve safety and decrease falls risk.    Personal Factors and Comorbidities  Comorbidity 2;Time since onset of injury/illness/exacerbation;Age    Comorbidities  L CVA with  ICH, R hemiplegia, contracture of R ankle muscles, R shoulder adhesive capsulitis, multiple falls with injury, seizures, depression and anxiety, pt is a mother to young children    Examination-Activity Limitations  Locomotion Level;Squat;Stairs;Stand;Caring for Others    Examination-Participation Restrictions  Cleaning;Shop;Community Activity    Stability/Clinical Decision Making  Evolving/Moderate complexity    Rehab Potential  Good    Clinical Impairments Affecting Rehab Potential  time since onset, mm contracture in R ankle    PT Frequency  2x / week    PT Duration  8 weeks    PT Treatment/Interventions  ADLs/Self Care Home Management;Electrical Stimulation;Gait training;Stair training;Functional mobility training;Therapeutic activities;Therapeutic exercise;Balance training;Neuromuscular re-education;Patient/family education;Orthotic Fit/Training;Passive range of motion;Dry needling;Taping    PT Next Visit Plan  Check final LTG - HEP.  review knee extension exercise with theraband.  renew for 4 more weeks to work on balance reactions, leather toe cap to R shoe - did she try brooks with AFO at home.  descending/step downs (also with Bioness); Weight shift fully L for full step length RLE - needs to activate quad during terminal swing;  Bioness with treadmill,  Stepping over obstacles, gait on pavement, balance reactions if foot catches.  Bioness and use with elliptical; sit > stand bringing weight forwards over foot; closed chain hip ABD strengthening, continue intense  stretching program for RLE - gastroc, posterior tib, hamstring, hip ER, hip flexor.  RLE strengthening - RLE PNF patterns into IR/extension; especially hip EXT and ABD/IR  Balance training - rockerboard.    Consulted and Agree with Plan of Care  Patient       Patient will benefit from skilled therapeutic intervention in order to improve the following deficits and impairments:  Abnormal gait, Decreased balance, Decreased mobility, Decreased range of motion, Decreased strength, Difficulty walking, Hypomobility, Increased muscle spasms, Impaired flexibility, Impaired tone, Impaired sensation  Visit Diagnosis: Unsteadiness on feet  Spastic hemiplegia of right dominant side as late effect of other cerebrovascular disease (HCC)  Stiffness of right ankle, not elsewhere classified  Repeated falls  Muscle weakness (generalized)     Problem List Patient Active Problem List   Diagnosis Date Noted  . Depression 03/23/2017  . Anxiety 03/23/2017  . Muscle weakness 02/03/2017  . History of hemorrhagic stroke with residual hemiparesis ([place]) 12/10/2016  . Spastic hemiplegia and hemiparesis affecting dominant side ([place]) 09/13/2015  . Adhesive capsulitis of right shoulder 08/24/2015  . Localization-related symptomatic epilepsy and epileptic syndromes with simple partial seizures, not intractable, without status epilepticus ([place]) 08/21/2015  . Nontraumatic cortical hemorrhage of cerebral hemisphere ([place])   . Seizure ([place])   . Seizures ([place])   . Adjustment disorder with depressed mood   . Contracture of muscle ankle and foot 06/11/2015  . Hemiplga fol ntrm intcrbl hemor aff right dominant side ([place]) 06/06/2015  . Left-sided intracerebral hemorrhage ([place]) 06/04/2015  . Seizure disorder as sequela of cerebrovascular accident ([place])   . Seizure disorder ([place]) 06/03/2015  . Sepsis ([place]) 06/03/2015  . UTI (urinary tract infection) 06/03/2015  . Hypotension 06/02/2015  . Right spastic hemiparesis  ([place]) 05/28/2015  . Aphasia following nontraumatic intracerebral hemorrhage 05/28/2015  . History of anxiety disorder 05/28/2015  . ICH (intracerebral hemorrhage) ([place])   . Seizure disorder, nonconvulsive, with status epilepticus ([place])   . Cerebral venous thrombosis of cortical vein   . Cytotoxic cerebral edema ([place])   . IVH (intraventricular hemorrhage) ([place])   . Term pregnancy 05/07/2015  . Spontaneous vaginal delivery 05/07/2015    Tilda Burrow  Melrose Nakayama, PT, DPT 07/06/19    3:47 PM    [facility] [address] [place], [place], [address] Phone: [phone]   Fax:  [phone]  Name: Yamily Vollmer MRN: [id number] Date of Birth: 06/30/1976

## 2019-07-06 NOTE — Therapy (Signed)
[facility] [address] [place], [place], [address] Phone: [phone]   Fax:  [phone]  Occupational Therapy Treatment  Patient Details  Name: Ashley [name] MRN: [id number] Date of Birth: 1976/05/16 Referring Provider (OT): Dr Marton Redwood   Encounter Date: 07/06/2019  OT End of Session - 07/06/19 1756    Visit Number  11    Number of Visits  16    Date for OT Re-Evaluation  08/17/19   date adjusted as pt missed 3 weeks of therapy due to scheduling   Authorization Type  medicare with BCBS - will need PN every 10th visit    Authorization Time Period  30 visit COMBINED between PT/OT;  pt has utilized benefit and is now self pay.     Authorization - Visit Number  11    Authorization - Number of Visits  20    OT Start Time  [id number]    OT Stop Time  1415    OT Time Calculation (min)  49 min    Activity Tolerance  Patient tolerated treatment well       Past Medical History:  Diagnosis Date  . Anxiety   . Depression   . ICH (intracerebral hemorrhage) ([place])   . Seizures ([place])   . Stroke [facility])     Past Surgical History:  Procedure Laterality Date  . DILATION AND CURETTAGE OF UTERUS      There were no vitals filed for this visit.  Subjective Assessment - 07/06/19 1754    Subjective   I guess PT said that my ankle ROM has really improved    Pertinent History  see epic snapshot; pt with [facility] 2 weeks after deliverying her 3rd child    Patient Stated Goals  strength of arm and get my R leg working better    Currently in Pain?  No/denies         Patient seen for aquatic therapy today.  Treatment took place in water 2.5-4 feet deep depending upon activity.  Pt entered the pool via steps using 2 hand rails and mod I.  Pt demonstrating significantly improved postural alignment and control wit entry. Utilized aqua stretch techniques to continue to address L ankle ROM - PT note indicates pt has gained 30* of dorsiflexion  and now has more active movement in L ankle (see PT note).  Pt also demonstrating improved ability on land and in water to advance weight over LLE given improvements in L ankle ROM.  Pt tolerated well.  Instructed pt in active stretching activities she can do on land as well using L air cast for L ankle as well as to decrease tightness and spasticity in LLE. After instruction and practice pt able to return demonstrate.  Pt with some improvement in sensation for stretch in water.  Addressed functional ambulation in open water with no UE support with emphasis on anterior lateral weight shifting vs lateral weight shifting. Pt overshifts laterally to L and then only comes to midline when attempting to weight shift to R. Facilitation today focused on decreasing excursion of weight shift to L to allow while facilitating anterior lateral weight shift - this resulted in improved weight shift onto RLE.  Addressed transitional movements of standing to supine to standing and then to prone without use of flotation devices today to increase demand for trunk control and for more active control of LLE.  Given that pt has significant sensory deficits and often is unable to know when  LLE is activated, water provides immediate feedback related to how active LLE is due to buoyancy - when not activated LLE floats and disrupts pt's balance.  Pt able to verbalize understanding of this concept and incorporate into activities as a self feedback mechanism.  Also addressed postural alignment and control and UE strengthening in supine without use of flotation devices with pt using only UE's to pull body weight length of pool while maintaining core alignment - pt needed only intermittent min a.  Pt exited the pool via the steps using 2 hand railings and mod I                      OT Short Term Goals - 07/06/19 1754      OT SHORT TERM GOAL #1   Title  Pt will demonstrate improved overhead reach to at least 130* of  shoulder flexion RUE  to assist with heavier overhead home mgmt tasks -7/8//2020 (goal date adjusted to meet 4 visits)    Status  Achieved      OT SHORT TERM GOAL #2   Title  Pt will demonstrate improved RUE strength /core strength to assist with lifting heavy items in grocery store with greater ease (per pt report)    Status  Achieved      OT SHORT TERM GOAL #3   Title  Pt will demonstrate ability to lift and carry a 5 pound object in the grocery store with her RUE with no LOB - 06/22/2019 (date adjusted due to hold first two weeks in August due to scheduling)    Status  Achieved        OT Long Term Goals - 07/06/19 1755      OT LONG TERM GOAL #1   Title  Pt will be mod I with upgraded HEP to include aquatic based HEP - 08/17/2019 (date adjusted as pt missed 3 weeks of therapy due to scheduling)    Status  On-going      OT LONG TERM GOAL #2   Title  Pt will demonstrate improved postural alignment and control to reduce falls (has been falling at least 1x/month)    Status  On-going            Plan - 07/06/19 1755    Clinical Impression Statement  Pt continues to progress toward goals. Pt reports that per PT her ankle ROM has significantly improved as well as performance on standardized testing.    OT Occupational Profile and History  Problem Focused Assessment - Including review of records relating to presenting problem    Occupational performance deficits (Please refer to evaluation for details):  IADL's;Leisure;Social Participation    Body Structure / Function / Physical Skills  Balance;Mobility;UE functional use;Strength;IADL;Tone;ROM;GMC    Rehab Potential  Good    Clinical Decision Making  Limited treatment options, no task modification necessary    Comorbidities Affecting Occupational Performance:  May have comorbidities impacting occupational performance    Modification or Assistance to Complete Evaluation   No modification of tasks or assist necessary to complete eval     OT Frequency  1x / week    OT Duration  8 weeks    OT Treatment/Interventions  Aquatic Therapy;Self-care/ADL training;Neuromuscular education;Therapeutic exercise;Functional Mobility Training;Balance training    Plan  aquatic therapy focused on NMR for RUE functional use, postural alignment and control, RLE strengtening and balance.     Consulted and Agree with Plan of Care  Patient  Patient will benefit from skilled therapeutic intervention in order to improve the following deficits and impairments:   Body Structure / Function / Physical Skills: Balance, Mobility, UE functional use, Strength, IADL, Tone, ROM, GMC       Visit Diagnosis: Unsteadiness on feet  Spastic hemiplegia of right dominant side as late effect of other cerebrovascular disease (HCC)  Muscle weakness (generalized)  Abnormal posture  Stiffness of right shoulder, not elsewhere classified    Problem List Patient Active Problem List   Diagnosis Date Noted  . Depression 03/23/2017  . Anxiety 03/23/2017  . Muscle weakness 02/03/2017  . History of hemorrhagic stroke with residual hemiparesis ([place]) 12/10/2016  . Spastic hemiplegia and hemiparesis affecting dominant side ([place]) 09/13/2015  . Adhesive capsulitis of right shoulder 08/24/2015  . Localization-related symptomatic epilepsy and epileptic syndromes with simple partial seizures, not intractable, without status epilepticus ([place]) 08/21/2015  . Nontraumatic cortical hemorrhage of cerebral hemisphere ([place])   . Seizure ([place])   . Seizures ([place])   . Adjustment disorder with depressed mood   . Contracture of muscle ankle and foot 06/11/2015  . Hemiplga fol ntrm intcrbl hemor aff right dominant side ([place]) 06/06/2015  . Left-sided intracerebral hemorrhage ([place]) 06/04/2015  . Seizure disorder as sequela of cerebrovascular accident ([place])   . Seizure disorder ([place]) 06/03/2015  . Sepsis ([place]) 06/03/2015  . UTI (urinary tract infection) 06/03/2015  . Hypotension  06/02/2015  . Right spastic hemiparesis ([place]) 05/28/2015  . Aphasia following nontraumatic intracerebral hemorrhage 05/28/2015  . History of anxiety disorder 05/28/2015  . ICH (intracerebral hemorrhage) ([place])   . Seizure disorder, nonconvulsive, with status epilepticus ([place])   . Cerebral venous thrombosis of cortical vein   . Cytotoxic cerebral edema ([place])   . IVH (intraventricular hemorrhage) ([place])   . Term pregnancy 05/07/2015  . Spontaneous vaginal delivery 05/07/2015    Quay Burow , OTR/L 07/06/2019, 6:03 PM  [facility] [address] [place], [place], [address] Phone: [phone]   Fax:  [phone]  Name: Ashley [name] MRN: [id number] Date of Birth: Feb 15, 1976

## 2019-07-08 ENCOUNTER — Other Ambulatory Visit: Payer: Self-pay

## 2019-07-08 ENCOUNTER — Encounter: Payer: Self-pay | Admitting: Physical Therapy

## 2019-07-08 ENCOUNTER — Ambulatory Visit: Payer: Medicare Other | Admitting: Physical Therapy

## 2019-07-08 DIAGNOSIS — I69851 Hemiplegia and hemiparesis following other cerebrovascular disease affecting right dominant side: Secondary | ICD-10-CM | POA: Diagnosis not present

## 2019-07-08 DIAGNOSIS — R2681 Unsteadiness on feet: Secondary | ICD-10-CM | POA: Diagnosis not present

## 2019-07-08 DIAGNOSIS — M25671 Stiffness of right ankle, not elsewhere classified: Secondary | ICD-10-CM | POA: Diagnosis not present

## 2019-07-08 DIAGNOSIS — R296 Repeated falls: Secondary | ICD-10-CM

## 2019-07-08 DIAGNOSIS — M25611 Stiffness of right shoulder, not elsewhere classified: Secondary | ICD-10-CM | POA: Diagnosis not present

## 2019-07-08 DIAGNOSIS — M6281 Muscle weakness (generalized): Secondary | ICD-10-CM | POA: Diagnosis not present

## 2019-07-08 DIAGNOSIS — R293 Abnormal posture: Secondary | ICD-10-CM | POA: Diagnosis not present

## 2019-07-08 NOTE — Therapy (Signed)
[facility] [address] [place], [place], [address] Phone: [phone]   Fax:  [phone]  Physical Therapy Treatment  Patient Details  Name: Ashley [name] MRN: [id number] Date of Birth: 06/08/1976 Referring Provider (PT): Marton Redwood, MD   Encounter Date: 07/08/2019  PT End of Session - 07/08/19 1114    Visit Number  16    Number of Visits  24    Date for PT Re-Evaluation  09/06/19   delayed date due to lack of appointment times   Authorization Type  Medicare and BCBS - 10th visit PN required.  30 VL for OT; 30 VL for PT - 3 used    Authorization - Visit Number  19    Authorization - Number of Visits  30    PT Start Time  1020    PT Stop Time  1100    PT Time Calculation (min)  40 min    Equipment Utilized During Treatment  --    Activity Tolerance  Patient tolerated treatment well    Behavior During Therapy  WFL for tasks assessed/performed       Past Medical History:  Diagnosis Date  . Anxiety   . Depression   . ICH (intracerebral hemorrhage) ([place])   . Seizures ([place])   . Stroke [facility])     Past Surgical History:  Procedure Laterality Date  . DILATION AND CURETTAGE OF UTERUS      There were no vitals filed for this visit.  Subjective Assessment - 07/08/19 1025    Subjective  Pt is still deciding which shoes to get the leather toe cap on.    Pertinent History  : L CVA with ICH, R hemiplegia, contracture of R ankle muscles, R shoulder adhesive capsulitis, multiple falls with injury, seizures, depression and anxiety    Limitations  Standing;Walking    Patient Stated Goals  More stability, prevent falls, would like to be able to run    Currently in Pain?  No/denies       Gastroc / Heel Cord Stretch - On Step    Stand with heels over edge of stair. Holding rail, lower heels until stretch is felt in calf of legs. Repeat _3__ times. Do _2__ times per day.   Step-Down: Forward    Stand on first  step and lower left leg as far as possible to floor until you feel a stretch in your right calf.  Try to keep your knee pointing forwards as much as you can.  Hold for 10 seconds and then rest.  Repeat 10 times    Knee Extension: Step-Down Forward / Sideways / Backward (Eccentric)    Stand, holding support, RIGHT foot on step. Slowly bend RIGHT knee, keeping the knee forwards and bring left heel out to the side and down to floor.  Straighten RIGHT leg.  _10__ reps per set, _2__ sets per day, _5__ days per week.   Wall Squat    Feet shoulder width apart, squeezing a flat soccer ball between the knees, step feet forwards - _12___ inches in front of wall, lean against wall. Heels on floor, knees parallel, bend hips and knees to almost 90. Hold _10___ seconds and then return to standing.  Keep thighs squeezing yoga block the whole time. Repeat __6-8__ times. Do __1-2__ sessions per day.   HIP / KNEE: Extension - Modified Sit to Stand    PROGRESSION: Place flat soccer ball between knees.  Squeeze ball equally and then [employer]  forward, raise hips up from surface - keep knees over feet and pause for a couple of seconds. Straighten hips and knees. Weight bear equally on left and right sides.  Maintain the squeeze as you stand up and sit back down. _10__ reps per set, __2_ sets per day.   Hip Internal Rotation (Eccentric) - Prone, Knees Flexed to 90 Degrees    Lie on stomach with Right knee flexed to 90. Have a belt around your foot and pulling on outside of foot.  Slowly lower legs outward and heel towards buttocks x 30 seconds. Relax and then perform again. _4__ reps per set, __2_ sets per day, __7_ days per week.   Knee Extension: Standing (Single Leg)    In stride stance, anchor tubing around table leg and loop around ankle of right foot. Have left leg positioned forwards.  Bring right knee up in the air and kick foot forwards with control placing heel on floor.  Bring leg back with  control. Repeat _10_ times per set. Repeat with other leg. Do _2_ sets per session. Do _5_ sessions per week.                             PT Education - 07/08/19 1111    Education provided  Yes    Education Details  updated HEP, plan to renew for 4 weeks    Person(s) Educated  Patient    Methods  Explanation;Demonstration;Handout    Comprehension  Verbalized understanding;Returned demonstration       PT Short Term Goals - 06/08/19 1551      PT SHORT TERM GOAL #1   Title  Patient will demonstrate independence with updated stretching and strengthening HEP    Time  4    Period  Weeks    Status  Achieved    Target Date  06/09/19      PT SHORT TERM GOAL #2   Title  Pt will participate in further assessment of falls risk during gait with gait velocity and FGA    Time  4    Period  Weeks    Status  Achieved    Target Date  06/09/19      PT SHORT TERM GOAL #3   Title  Pt will participate in assessment for new and more appropriate AFO    Time  4    Period  Weeks    Status  Achieved    Target Date  06/09/19      PT SHORT TERM GOAL #4   Title  Pt will tolerate initial set up and fitting of functional electrical stimulation and will begin ROM and gait training with Bioness on RLE (if cleared medically by physician)    Time  4    Period  Weeks    Status  Achieved    Target Date  06/09/19      PT SHORT TERM GOAL #5   Title  Pt will participate in assessment of RLE mechanics when performing exercise on elliptical    Time  4    Period  Weeks    Status  Achieved    Target Date  06/09/19        PT Long Term Goals - 07/08/19 1116      PT LONG TERM GOAL #1   Title  Pt will demonstrate independence with final HEP and improved RLE mechanics on elliptical when using for aerobic exercise (07/09/19)  Time  8    Period  Weeks    Status  Achieved      PT LONG TERM GOAL #2   Title  Pt will demonstrate 3 point improvement in DGI to indicate decreased falls  risk    Baseline  22/24    Time  8    Period  Weeks    Status  Achieved      PT LONG TERM GOAL #3   Title  Pt will demonstrate improvement in gait velocity with new AFO to >/= 3.6 ft/sec    Baseline  3.8 with leather toe cap    Time  8    Period  Weeks    Status  Achieved      PT LONG TERM GOAL #4   Title  Pt will demonstrate 5 deg improvement in passive or active R ankle eversion and 20 degree improvement in ankle DF to improve gait mechanics    Baseline  improved eversion by 5 deg actively; improved DF by 30 deg    Time  8    Period  Weeks    Status  Achieved      PT LONG TERM GOAL #5   Title  Pt will demonstrate ability to don new AFO and will tolerate full day of wear    Time  8    Period  Weeks    Status  Achieved       New goals for recertification:    PT Short Term Goals - 07/08/19 1122      PT SHORT TERM GOAL #1   Title  = LTG      PT Long Term Goals - 07/08/19 1116      PT LONG TERM GOAL #1   Title  Pt will demonstrate independence with final HEP and improved RLE mechanics on elliptical when using for aerobic exercise (date reflects delay in start of new visits; 4 week total POC)    Time  4    Period  Weeks    Status  New    Target Date  09/06/19      PT LONG TERM GOAL #2   Title  Pt will ambulate 1,000 outside over uneven terrain, negotiate curbs and over large obstacles MOD I with AFO and leather toe cap safely without catching R toe    Baseline  --    Time  4    Period  Weeks    Status  New    Target Date  09/06/19      PT LONG TERM GOAL #3   Title  Pt will demonstrate improvement in gait velocity with new AFO and leather toe cap to 4.54f/sec    Baseline  3.8 with leather toe cap    Time  4    Period  Weeks    Status  Revised    Target Date  09/06/19      PT LONG TERM GOAL #4   Title  Pt will negotiate 12 stairs with one rail MOD I alternating sequence while maintaining R hip in neutral rotation due to improved weight shifting.    Baseline   --    Time  4    Period  Weeks    Status  New    Target Date  09/06/19      PT LONG TERM GOAL #5   Title  --    Time  --    Period  --    Status  --  Plan - 07/08/19 1116    Clinical Impression Statement  Pt has made excellent progress and has met all LTG - she demonstrates significant improvement in AROM and muscle activation for ankle DF and eversion, is independent with donning new AFO with T-strap, improved gait sequence and decreased falls risk during dynamic gait and is independent with HEP.  Due to significant progress HEP updated today.  Pt continues to demonstrate abnormal gait pattern with intermittent catching/dragging of R toe which continues to place pt at increased risk for falls and also continues to present with impaired ROM and strength in core and proximal hip muscles.  Pt would benefit from continued skilled PT services to address these impairments to maximize functional mobility independence and decrease falls risk.    Personal Factors and Comorbidities  Comorbidity 2;Time since onset of injury/illness/exacerbation;Age    Comorbidities  L CVA with ICH, R hemiplegia, contracture of R ankle muscles, R shoulder adhesive capsulitis, multiple falls with injury, seizures, depression and anxiety, pt is a mother to young children    Examination-Activity Limitations  Locomotion Level;Squat;Stairs;Stand;Caring for Others    Examination-Participation Restrictions  Cleaning;Shop;Community Activity    Stability/Clinical Decision Making  Evolving/Moderate complexity    Rehab Potential  Good    Clinical Impairments Affecting Rehab Potential  time since onset, mm contracture in R ankle    PT Frequency  2x / week    PT Duration  4 weeks    PT Treatment/Interventions  ADLs/Self Care Home Management;Electrical Stimulation;Gait training;Stair training;Functional mobility training;Therapeutic activities;Therapeutic exercise;Balance training;Neuromuscular  re-education;Patient/family education;Orthotic Fit/Training;Passive range of motion;Dry needling;Taping    PT Next Visit Plan  lateral step downs in various directions.  leather toe cap to R shoe - did she try brooks with AFO at home.  descending/step downs (also with Bioness); Weight shift fully L for full step length RLE - needs to activate quad during terminal swing;  Bioness with treadmill,  Stepping over obstacles, gait on pavement, balance reactions if foot catches.  Bioness and use with elliptical; sit > stand bringing weight forwards over foot; closed chain hip ABD strengthening, continue intense stretching program for RLE - gastroc, posterior tib, hamstring, hip ER, hip flexor.  RLE strengthening - RLE PNF patterns into IR/extension; especially hip EXT and ABD/IR  Balance training - rockerboard.    Consulted and Agree with Plan of Care  Patient       Patient will benefit from skilled therapeutic intervention in order to improve the following deficits and impairments:  Abnormal gait, Decreased balance, Decreased mobility, Decreased range of motion, Decreased strength, Difficulty walking, Hypomobility, Increased muscle spasms, Impaired flexibility, Impaired tone, Impaired sensation  Visit Diagnosis: Unsteadiness on feet  Spastic hemiplegia of right dominant side as late effect of other cerebrovascular disease (HCC)  Muscle weakness (generalized)  Stiffness of right ankle, not elsewhere classified  Repeated falls     Problem List Patient Active Problem List   Diagnosis Date Noted  . Depression 03/23/2017  . Anxiety 03/23/2017  . Muscle weakness 02/03/2017  . History of hemorrhagic stroke with residual hemiparesis ([place]) 12/10/2016  . Spastic hemiplegia and hemiparesis affecting dominant side ([place]) 09/13/2015  . Adhesive capsulitis of right shoulder 08/24/2015  . Localization-related symptomatic epilepsy and epileptic syndromes with simple partial seizures, not intractable, without  status epilepticus ([place]) 08/21/2015  . Nontraumatic cortical hemorrhage of cerebral hemisphere ([place])   . Seizure ([place])   . Seizures ([place])   . Adjustment disorder with depressed mood   . Contracture of muscle  ankle and foot 06/11/2015  . Hemiplga fol ntrm intcrbl hemor aff right dominant side ([place]) 06/06/2015  . Left-sided intracerebral hemorrhage ([place]) 06/04/2015  . Seizure disorder as sequela of cerebrovascular accident ([place])   . Seizure disorder ([place]) 06/03/2015  . Sepsis ([place]) 06/03/2015  . UTI (urinary tract infection) 06/03/2015  . Hypotension 06/02/2015  . Right spastic hemiparesis ([place]) 05/28/2015  . Aphasia following nontraumatic intracerebral hemorrhage 05/28/2015  . History of anxiety disorder 05/28/2015  . ICH (intracerebral hemorrhage) ([place])   . Seizure disorder, nonconvulsive, with status epilepticus ([place])   . Cerebral venous thrombosis of cortical vein   . Cytotoxic cerebral edema ([place])   . IVH (intraventricular hemorrhage) ([place])   . Term pregnancy 05/07/2015  . Spontaneous vaginal delivery 05/07/2015    [name], PT, DPT 07/08/19    11:32 AM    [facility] [address] [place], [place], [address] Phone: [phone]   Fax:  [phone]  Name: Renu Asby MRN: [id number] Date of Birth: 02/27/1976

## 2019-07-08 NOTE — Patient Instructions (Signed)
Gastroc / Heel Cord Stretch - On Step    Stand with heels over edge of stair. Holding rail, lower heels until stretch is felt in calf of legs. Repeat _3__ times. Do _2__ times per day.   Step-Down: Forward    Stand on first step and lower left leg as far as possible to floor until you feel a stretch in your right calf.  Try to keep your knee pointing forwards as much as you can.  Hold for 10 seconds and then rest.  Repeat 10 times    Knee Extension: Step-Down Forward / Sideways / Backward (Eccentric)    Stand, holding support, RIGHT foot on step. Slowly bend RIGHT knee, keeping the knee forwards and bring left heel out to the side and down to floor.  Straighten RIGHT leg.  _10__ reps per set, _2__ sets per day, _5__ days per week.   Wall Squat    Feet shoulder width apart, squeezing a flat soccer ball between the knees, step feet forwards - _12___ inches in front of wall, lean against wall. Heels on floor, knees parallel, bend hips and knees to almost 90. Hold _10___ seconds and then return to standing.  Keep thighs squeezing yoga block the whole time. Repeat __6-8__ times. Do __1-2__ sessions per day.   HIP / KNEE: Extension - Modified Sit to Stand    PROGRESSION: Place flat soccer ball between knees.  Squeeze ball equally and then Lean forward, raise hips up from surface - keep knees over feet and pause for a couple of seconds. Straighten hips and knees. Weight bear equally on left and right sides.  Maintain the squeeze as you stand up and sit back down. _10__ reps per set, __2_ sets per day.   Hip Internal Rotation (Eccentric) - Prone, Knees Flexed to 90 Degrees    Lie on stomach with Right knee flexed to 90. Have a belt around your foot and pulling on outside of foot.  Slowly lower legs outward and heel towards buttocks x 30 seconds. Relax and then perform again. _4__ reps per set, __2_ sets per day, __7_ days per week.   Knee Extension: Standing (Single Leg)     In stride stance, anchor tubing around table leg and loop around ankle of right foot. Have left leg positioned forwards.  Bring right knee up in the air and kick foot forwards with control placing heel on floor.  Bring leg back with control. Repeat _10_ times per set. Repeat with other leg. Do _2_ sets per session. Do _5_ sessions per week.

## 2019-07-13 ENCOUNTER — Encounter: Payer: Self-pay | Admitting: Occupational Therapy

## 2019-07-13 ENCOUNTER — Encounter: Payer: Self-pay | Admitting: Physical Therapy

## 2019-07-13 ENCOUNTER — Other Ambulatory Visit: Payer: Self-pay

## 2019-07-13 ENCOUNTER — Ambulatory Visit: Payer: Medicare Other | Admitting: Physical Therapy

## 2019-07-13 ENCOUNTER — Ambulatory Visit: Payer: Medicare Other | Admitting: Occupational Therapy

## 2019-07-13 DIAGNOSIS — M6281 Muscle weakness (generalized): Secondary | ICD-10-CM

## 2019-07-13 DIAGNOSIS — R2681 Unsteadiness on feet: Secondary | ICD-10-CM

## 2019-07-13 DIAGNOSIS — R296 Repeated falls: Secondary | ICD-10-CM | POA: Diagnosis not present

## 2019-07-13 DIAGNOSIS — R293 Abnormal posture: Secondary | ICD-10-CM | POA: Diagnosis not present

## 2019-07-13 DIAGNOSIS — M25671 Stiffness of right ankle, not elsewhere classified: Secondary | ICD-10-CM | POA: Diagnosis not present

## 2019-07-13 DIAGNOSIS — I69851 Hemiplegia and hemiparesis following other cerebrovascular disease affecting right dominant side: Secondary | ICD-10-CM | POA: Diagnosis not present

## 2019-07-13 DIAGNOSIS — M25611 Stiffness of right shoulder, not elsewhere classified: Secondary | ICD-10-CM | POA: Diagnosis not present

## 2019-07-13 NOTE — Therapy (Signed)
[facility] [address] [place], [place], [address] Phone: [phone]   Fax:  [phone]  Occupational Therapy Treatment  Patient Details  Name: Ashley [name] MRN: [id number] Date of Birth: 1976/06/08 Referring Provider (OT): Dr Marton Redwood   Encounter Date: 07/13/2019  OT End of Session - 07/13/19 1804    Visit Number  12    Number of Visits  16    Authorization Type  medicare with BCBS - will need PN every 10th visit    Authorization Time Period  30 visit COMBINED between PT/OT;  pt has utilized benefit and is now self pay.     Authorization - Visit Number  12    Authorization - Number of Visits  20    OT Start Time  1331    OT Stop Time  1414    OT Time Calculation (min)  43 min    Activity Tolerance  Patient tolerated treatment well       Past Medical History:  Diagnosis Date  . Anxiety   . Depression   . ICH (intracerebral hemorrhage) ([place])   . Seizures ([place])   . Stroke [facility])     Past Surgical History:  Procedure Laterality Date  . DILATION AND CURETTAGE OF UTERUS      There were no vitals filed for this visit.  Subjective Assessment - 07/13/19 1801    Subjective   It's hard to control my body when I am trying to float on my stomach    Pertinent History  see epic snapshot; pt with [facility] 2 weeks after deliverying her 3rd child    Patient Stated Goals  strength of arm and get my R leg working better    Currently in Pain?  No/denies         Patient seen for aquatic therapy today.  Treatment took place in water 2.5-4 feet deep depending upon activity.  Pt entered the pool via steps using 2 hand railings and mod I.  Addressed functional ambulation in open water with light UE support using small dumb bells to provide visual feedback regarding trunk alignment due to pt's significant sensory deficits.  Pt needs min facilitation to decreased over weight shift to L - this results in pt's improved ability to  complete anterior lateral weight shift (vs lateral weight shift) with functional ambulation. Utilized aqua stretch techniques and weight bearing in standing with air cast to continue to address R ankle ROM for improved postural alignment and control. Addressed sit to stand from step (low surface) without UE support to actively engage increased R ankle ROM as well as to improved pt's ease in sit to stand from a low surface on land. Buoyancy in the water acts as light assist for pt to rise from lower surface.  Addressed transitional movements from standing to supine to prone to standing - initially pt required min a and cues but with practice performance improved and pt able to complete with just cues and increased effort. Worked in prone today using kick board to increase demand on core alignment and control in alternate plane - initially required min a but progressed to cues and able to hold aligned posture for slow count of 5 x10 reps.  Progressed to straight leg kicking in prone to address alternating reciprocal movement of LE's as well as to address maintaining pelvic alignment and stability with LE movement - due to sensory deficits, decreased active control, weakness of trunk and RLE and apraxia pt  needed mod a for activity. Will continue to address.  Also addressed core control in supine with UE pulls (pulling full body weight through water) 2 lengths of pool - pt demonstrating improved use of RUE with this activity as well as improved trunk control and alignment. Pt exited the pool via steps using 2 hand rails and mod I.                     OT Short Term Goals - 07/13/19 1802      OT SHORT TERM GOAL #1   Title  Pt will demonstrate improved overhead reach to at least 130* of shoulder flexion RUE  to assist with heavier overhead home mgmt tasks -7/8//2020 (goal date adjusted to meet 4 visits)    Status  Achieved      OT SHORT TERM GOAL #2   Title  Pt will demonstrate improved RUE  strength /core strength to assist with lifting heavy items in grocery store with greater ease (per pt report)    Status  Achieved      OT SHORT TERM GOAL #3   Title  Pt will demonstrate ability to lift and carry a 5 pound object in the grocery store with her RUE with no LOB - 06/22/2019 (date adjusted due to hold first two weeks in August due to scheduling)    Status  Achieved        OT Long Term Goals - 07/13/19 1802      OT LONG TERM GOAL #1   Title  Pt will be mod I with upgraded HEP to include aquatic based HEP - 08/17/2019 (date adjusted as pt missed 3 weeks of therapy due to scheduling)    Status  On-going      OT LONG TERM GOAL #2   Title  Pt will demonstrate improved postural alignment and control to reduce falls (has been falling at least 1x/month)    Status  On-going            Plan - 07/13/19 1802    Clinical Impression Statement  Pt with improivng postural alignment and control with functional mobility and functional transitional movements.    OT Occupational Profile and History  Problem Focused Assessment - Including review of records relating to presenting problem    Occupational performance deficits (Please refer to evaluation for details):  IADL's;Leisure;Social Participation    Body Structure / Function / Physical Skills  Balance;Mobility;UE functional use;Strength;IADL;Tone;ROM;GMC    Rehab Potential  Good    Clinical Decision Making  Limited treatment options, no task modification necessary    Comorbidities Affecting Occupational Performance:  May have comorbidities impacting occupational performance    Modification or Assistance to Complete Evaluation   No modification of tasks or assist necessary to complete eval    OT Frequency  1x / week    OT Duration  8 weeks    OT Treatment/Interventions  Aquatic Therapy;Self-care/ADL training;Neuromuscular education;Therapeutic exercise;Functional Mobility Training;Balance training    Plan  aquatic therapy focused on NMR  for RUE functional use, postural alignment and control, RLE strengtening and balance.     Consulted and Agree with Plan of Care  Patient       Patient will benefit from skilled therapeutic intervention in order to improve the following deficits and impairments:   Body Structure / Function / Physical Skills: Balance, Mobility, UE functional use, Strength, IADL, Tone, ROM, GMC       Visit Diagnosis: Unsteadiness on feet  Spastic  hemiplegia of right dominant side as late effect of other cerebrovascular disease (HCC)  Muscle weakness (generalized)  Abnormal posture    Problem List Patient Active Problem List   Diagnosis Date Noted  . Depression 03/23/2017  . Anxiety 03/23/2017  . Muscle weakness 02/03/2017  . History of hemorrhagic stroke with residual hemiparesis ([place]) 12/10/2016  . Spastic hemiplegia and hemiparesis affecting dominant side ([place]) 09/13/2015  . Adhesive capsulitis of right shoulder 08/24/2015  . Localization-related symptomatic epilepsy and epileptic syndromes with simple partial seizures, not intractable, without status epilepticus ([place]) 08/21/2015  . Nontraumatic cortical hemorrhage of cerebral hemisphere ([place])   . Seizure ([place])   . Seizures ([place])   . Adjustment disorder with depressed mood   . Contracture of muscle ankle and foot 06/11/2015  . Hemiplga fol ntrm intcrbl hemor aff right dominant side ([place]) 06/06/2015  . Left-sided intracerebral hemorrhage ([place]) 06/04/2015  . Seizure disorder as sequela of cerebrovascular accident ([place])   . Seizure disorder ([place]) 06/03/2015  . Sepsis ([place]) 06/03/2015  . UTI (urinary tract infection) 06/03/2015  . Hypotension 06/02/2015  . Right spastic hemiparesis ([place]) 05/28/2015  . Aphasia following nontraumatic intracerebral hemorrhage 05/28/2015  . History of anxiety disorder 05/28/2015  . ICH (intracerebral hemorrhage) ([place])   . Seizure disorder, nonconvulsive, with status epilepticus ([place])   . Cerebral venous  thrombosis of cortical vein   . Cytotoxic cerebral edema ([place])   . IVH (intraventricular hemorrhage) ([place])   . Term pregnancy 05/07/2015  . Spontaneous vaginal delivery 05/07/2015    Quay Burow, OTR/L 07/13/2019, 6:05 PM  [facility] [address] [place], [place], [address] Phone: [phone]   Fax:  [phone]  Name: Ashley [name] MRN: [id number] Date of Birth: Apr 12, 1976

## 2019-07-13 NOTE — Therapy (Signed)
[facility] [address] [place], [place], [address] Phone: [phone]   Fax:  [phone]  Physical Therapy Treatment  Patient Details  Name: Ashley [name] MRN: [id number] Date of Birth: 1975-12-05 Referring Provider (PT): Marton Redwood, MD   Encounter Date: 07/13/2019  PT End of Session - 07/13/19 1605    Visit Number  17    Number of Visits  24    Date for PT Re-Evaluation  09/06/19   delayed date due to lack of appointment times   Authorization Type  Medicare and BCBS - 10th visit PN required.  30 VL for OT; 30 VL for PT - 3 used    Authorization - Visit Number  20    Authorization - Number of Visits  30    PT Start Time  [id number]    PT Stop Time  1535    PT Time Calculation (min)  43 min    Activity Tolerance  Patient tolerated treatment well    Behavior During Therapy  WFL for tasks assessed/performed       Past Medical History:  Diagnosis Date  . Anxiety   . Depression   . ICH (intracerebral hemorrhage) ([place])   . Seizures ([place])   . Stroke [facility])     Past Surgical History:  Procedure Laterality Date  . DILATION AND CURETTAGE OF UTERUS      There were no vitals filed for this visit.  Subjective Assessment - 07/13/19 1455    Subjective  Bruise on RLE is above T strap.  Has decided to get leather toe cap on R [employer]ances.    Pertinent History  : L CVA with ICH, R hemiplegia, contracture of R ankle muscles, R shoulder adhesive capsulitis, multiple falls with injury, seizures, depression and anxiety    Limitations  Standing;Walking    Patient Stated Goals  More stability, prevent falls, would like to be able to run    Currently in Pain?  No/denies                       [facility] Adult PT Treatment/Exercise - 07/13/19 1545      Ambulation/Gait   Ambulation/Gait  Yes    Ambulation/Gait Assistance  5: Supervision    Ambulation/Gait Assistance Details  with Bioness in gait mode performing  stepping over obstacle x 4 reps leading with RLE and focusing on maintaining forward weight shift and momentum    Assistive device  None    Pre-Gait Activities  At balance beam performed repeated step overs with RLE and Bioness in training mode focusing on controlled L lateral and anterior weight shift while clearing RLE over beam and then performing R knee extension to place heel on floor first.  Performed x 10 reps      Knee/Hip Exercises: Standing   Lateral Step Up  Right;1 set;15 reps;Hand Hold: 2;Hand Hold: 1;Hand Hold: 0;Step Height: 4"    Lateral Step Up Limitations  with Bioness in training mode for quad activation and lateral hip strengthening beginning with 2 > 1 > 0 UE support with therapist assisting with full R lateral weight shift and weight acceptance on RLE to allow for slow and controlled placement of LLE and to improve SLS.    Step Down  Left;2 sets;15 reps;Hand Hold: 2;Hand Hold: 1;Step Height: 6";Step Height: 4"    Step Down Limitations  with Bioness in training mode on 4" and then 6" step performing L tap downs forwards,  laterally and then posterior focusing on decreasing UE support and maintaining R hip rotation in neutral and weight shifted to RLE during step down      Modalities   Modalities  Electrical engineer Stimulation Location  R anterior tib and R quad    Electrical Stimulation Action  open and closed chain ankle DF, knee extension and hip flexion    Electrical Stimulation Parameters  See tablet 1 for parameters; steering electrode    Electrical Stimulation Goals  Strength;Tone;Neuromuscular facilitation             PT Education - 07/13/19 1605    Education provided  Yes    Education Details  stepping over obstacles    Person(s) Educated  Patient    Methods  Explanation    Comprehension  Verbalized understanding       PT Short Term Goals - 07/08/19 1122      PT SHORT TERM GOAL #1   Title  = LTG         PT Long Term Goals - 07/08/19 1116      PT LONG TERM GOAL #1   Title  Pt will demonstrate independence with final HEP and improved RLE mechanics on elliptical when using for aerobic exercise (date reflects delay in start of new visits; 4 week total POC)    Time  4    Period  Weeks    Status  New    Target Date  09/06/19      PT LONG TERM GOAL #2   Title  Pt will ambulate 1,000 outside over uneven terrain, negotiate curbs and over large obstacles MOD I with AFO and leather toe cap safely without catching R toe    Baseline  --    Time  4    Period  Weeks    Status  New    Target Date  09/06/19      PT LONG TERM GOAL #3   Title  Pt will demonstrate improvement in gait velocity with new AFO and leather toe cap to 4.22ft/sec    Baseline  3.8 with leather toe cap    Time  4    Period  Weeks    Status  Revised    Target Date  09/06/19      PT LONG TERM GOAL #4   Title  Pt will negotiate 12 stairs with one rail MOD I alternating sequence while maintaining R hip in neutral rotation due to improved weight shifting.    Baseline  --    Time  4    Period  Weeks    Status  New    Target Date  09/06/19      PT LONG TERM GOAL #5   Title  --    Time  --    Period  --    Status  --            Plan - 07/13/19 1606    Clinical Impression Statement  Returned to use of Bioness with thigh cuff on quad muscle performing lateral step ups and step downs on variety of height steps with continued focus on full weight acceptance and SLS on RLE while maintaining neutral hip rotation when stepping down with LLE.  Also continued to perform gait training with focus on safely negotiating over obstacles.  Pt to have leather toe cap put on tennis shoes.  Will continue to address and progress towards  LTG.    Personal Factors and Comorbidities  Comorbidity 2;Time since onset of injury/illness/exacerbation;Age    Comorbidities  L CVA with ICH, R hemiplegia, contracture of R ankle muscles, R shoulder  adhesive capsulitis, multiple falls with injury, seizures, depression and anxiety, pt is a mother to young children    Examination-Activity Limitations  Locomotion Level;Squat;Stairs;Stand;Caring for Others    Examination-Participation Restrictions  Cleaning;Shop;Community Activity    Stability/Clinical Decision Making  Evolving/Moderate complexity    Rehab Potential  Good    Clinical Impairments Affecting Rehab Potential  time since onset, mm contracture in R ankle    PT Frequency  2x / week    PT Duration  4 weeks    PT Treatment/Interventions  ADLs/Self Care Home Management;Electrical Stimulation;Gait training;Stair training;Functional mobility training;Therapeutic activities;Therapeutic exercise;Balance training;Neuromuscular re-education;Patient/family education;Orthotic Fit/Training;Passive range of motion;Dry needling;Taping    PT Next Visit Plan  R SLS activities; lateral step downs in various directions.  leather toe cap to R shoe - did she try brooks with AFO at home.  descending/step downs (also with Bioness); Weight shift fully L for full step length RLE - needs to activate quad during terminal swing;  Bioness with treadmill,  Stepping over obstacles, gait on pavement, balance reactions if foot catches.  Bioness and use with elliptical; sit > stand bringing weight forwards over foot; closed chain hip ABD strengthening, continue intense stretching program for RLE - gastroc, posterior tib, hamstring, hip ER, hip flexor.  RLE strengthening - RLE PNF patterns into IR/extension; especially hip EXT and ABD/IR  Balance training - rockerboard.    Consulted and Agree with Plan of Care  Patient       Patient will benefit from skilled therapeutic intervention in order to improve the following deficits and impairments:  Abnormal gait, Decreased balance, Decreased mobility, Decreased range of motion, Decreased strength, Difficulty walking, Hypomobility, Increased muscle spasms, Impaired flexibility,  Impaired tone, Impaired sensation  Visit Diagnosis: Unsteadiness on feet  Spastic hemiplegia of right dominant side as late effect of other cerebrovascular disease (HCC)  Muscle weakness (generalized)  Stiffness of right ankle, not elsewhere classified  Repeated falls     Problem List Patient Active Problem List   Diagnosis Date Noted  . Depression 03/23/2017  . Anxiety 03/23/2017  . Muscle weakness 02/03/2017  . History of hemorrhagic stroke with residual hemiparesis ([place]) 12/10/2016  . Spastic hemiplegia and hemiparesis affecting dominant side ([place]) 09/13/2015  . Adhesive capsulitis of right shoulder 08/24/2015  . Localization-related symptomatic epilepsy and epileptic syndromes with simple partial seizures, not intractable, without status epilepticus ([place]) 08/21/2015  . Nontraumatic cortical hemorrhage of cerebral hemisphere ([place])   . Seizure ([place])   . Seizures ([place])   . Adjustment disorder with depressed mood   . Contracture of muscle ankle and foot 06/11/2015  . Hemiplga fol ntrm intcrbl hemor aff right dominant side ([place]) 06/06/2015  . Left-sided intracerebral hemorrhage ([place]) 06/04/2015  . Seizure disorder as sequela of cerebrovascular accident ([place])   . Seizure disorder ([place]) 06/03/2015  . Sepsis ([place]) 06/03/2015  . UTI (urinary tract infection) 06/03/2015  . Hypotension 06/02/2015  . Right spastic hemiparesis ([place]) 05/28/2015  . Aphasia following nontraumatic intracerebral hemorrhage 05/28/2015  . History of anxiety disorder 05/28/2015  . ICH (intracerebral hemorrhage) ([place])   . Seizure disorder, nonconvulsive, with status epilepticus ([place])   . Cerebral venous thrombosis of cortical vein   . Cytotoxic cerebral edema ([place])   . IVH (intraventricular hemorrhage) ([place])   . Term pregnancy 05/07/2015  .  Spontaneous vaginal delivery 05/07/2015   [name], PT, DPT 07/13/19    4:10 PM    [facility] [address] [place], [place], [address] Phone: [phone]   Fax:  [phone]  Name: Ashley [name] MRN: [id number] Date of Birth: Jan 09, 1976

## 2019-07-20 ENCOUNTER — Ambulatory Visit: Payer: Medicare Other | Admitting: Physical Therapy

## 2019-07-20 ENCOUNTER — Other Ambulatory Visit: Payer: Self-pay

## 2019-07-20 ENCOUNTER — Ambulatory Visit: Payer: Medicare Other | Attending: Physical Medicine & Rehabilitation | Admitting: Occupational Therapy

## 2019-07-20 ENCOUNTER — Encounter: Payer: Self-pay | Admitting: Occupational Therapy

## 2019-07-20 DIAGNOSIS — M25671 Stiffness of right ankle, not elsewhere classified: Secondary | ICD-10-CM | POA: Diagnosis not present

## 2019-07-20 DIAGNOSIS — M25611 Stiffness of right shoulder, not elsewhere classified: Secondary | ICD-10-CM | POA: Diagnosis not present

## 2019-07-20 DIAGNOSIS — I69851 Hemiplegia and hemiparesis following other cerebrovascular disease affecting right dominant side: Secondary | ICD-10-CM | POA: Diagnosis not present

## 2019-07-20 DIAGNOSIS — R293 Abnormal posture: Secondary | ICD-10-CM

## 2019-07-20 DIAGNOSIS — R2681 Unsteadiness on feet: Secondary | ICD-10-CM | POA: Diagnosis not present

## 2019-07-20 DIAGNOSIS — R296 Repeated falls: Secondary | ICD-10-CM | POA: Insufficient documentation

## 2019-07-20 DIAGNOSIS — M6281 Muscle weakness (generalized): Secondary | ICD-10-CM | POA: Insufficient documentation

## 2019-07-20 NOTE — Therapy (Signed)
[facility] [address] [place], [place], [address] Phone: [phone]   Fax:  [phone]  Occupational Therapy Treatment  Patient Details  Name: [name] MRN: [id number] Date of Birth: 1976/02/09 Referring Provider (OT): Dr Marton Redwood   Encounter Date: 07/20/2019  OT End of Session - 07/20/19 1801    Visit Number  13    Number of Visits  16    Date for OT Re-Evaluation  08/17/19    Authorization Type  medicare with [employer] - will need PN every 10th visit    Authorization - Visit Number  71    Authorization - Number of Visits  20    OT Start Time  1327    OT Stop Time  1414    OT Time Calculation (min)  47 min    Activity Tolerance  Patient tolerated treatment well       Past Medical History:  Diagnosis Date  . Anxiety   . Depression   . ICH (intracerebral hemorrhage) ([place])   . Seizures ([place])   . Stroke [facility])     Past Surgical History:  Procedure Laterality Date  . DILATION AND CURETTAGE OF UTERUS      There were no vitals filed for this visit.  Subjective Assessment - 07/20/19 1758    Subjective   Its easier to reach for things in my higher cabinets wtih my R arm now    Pertinent History  see epic snapshot; pt with SAH 2 weeks after deliverying her 3rd child    Patient Stated Goals  strength of arm and get my R leg working better    Currently in Pain?  No/denies       Patient seen for aquatic therapy today.  Treatment took place in water 2.5-4 feet deep depending upon activity.  Pt entered the pool via steps using 2 hand rails and mod I.  Continue to address R ankle ROM using aqua stretch techniques as well as weight shifting onto RLE with R air cast on for support and heels off lower step in water.  Also addressed active dorsiflexion using air cast for lateral support and BUE support on pool to complete RLE only heel ups ( 10 reps). Pt is not able to complete on land however is in the water and is  improving based on now needing no physical assistance (cues only for postural alignment and and to decrease reliance on UE's with this activity).  Addressed functional ambulation and postural alignment and control via open water forward walking with facilitation for anterior lateral weight shift (vs lateral weight shift) and facilitation to decrease over shifting to L.  Pt slowly improving in ability to control this.  Transitioned onto back and addressed R shoulder girdle alignment and ROM for overhead reach with good results immediately followed by UE pulls (arms only) while supine entire length of poll x2 to strengthen and activate into increased ROM.  This activity also addresses postural alignment and control as pt must maintain core control for activity (pt needs intermittent min a).  Pt able to transition to prone - addressed UE pulls in this position - utilized altered UE pull down pattern and pt required additional flotation devices due to increased difficulty in this position to achieve and maintain core control with UE movement (pt needed min - mod a with flotation devices at hips and chest).  Pt exited the pool via steps using 2 hand rails and mod I.  OT Short Term Goals - 07/20/19 1800      OT SHORT TERM GOAL #1   Title  Pt will demonstrate improved overhead reach to at least 130* of shoulder flexion RUE  to assist with heavier overhead home mgmt tasks -7/8//2020 (goal date adjusted to meet 4 visits)    Status  Achieved      OT SHORT TERM GOAL #2   Title  Pt will demonstrate improved RUE strength /core strength to assist with lifting heavy items in grocery store with greater ease (per pt report)    Status  Achieved      OT SHORT TERM GOAL #3   Title  Pt will demonstrate ability to lift and carry a 5 pound object in the grocery store with her RUE with no LOB - 06/22/2019 (date adjusted due to hold first two weeks in August due to scheduling)    Status   Achieved        OT Long Term Goals - 07/20/19 1800      OT LONG TERM GOAL #1   Title  Pt will be mod I with upgraded HEP to include aquatic based HEP - 08/17/2019 (date adjusted as pt missed 3 weeks of therapy due to scheduling)    Status  On-going      OT LONG TERM GOAL #2   Title  Pt will demonstrate improved postural alignment and control to reduce falls (has been falling at least 1x/month)    Status  On-going            Plan - 07/20/19 1800    Clinical Impression Statement  Pt progressing toward goals. Pt reports she feels more steady on her feet and that it is easier for her to reach into high cabinets at home with her R arm and improved balance.    OT Occupational Profile and History  Problem Focused Assessment - Including review of records relating to presenting problem    Occupational performance deficits (Please refer to evaluation for details):  IADL's;Leisure;Social Participation    Body Structure / Function / Physical Skills  Balance;Mobility;UE functional use;Strength;IADL;Tone;ROM;GMC    Rehab Potential  Good    Clinical Decision Making  Limited treatment options, no task modification necessary    Comorbidities Affecting Occupational Performance:  May have comorbidities impacting occupational performance    Modification or Assistance to Complete Evaluation   No modification of tasks or assist necessary to complete eval    OT Frequency  1x / week    OT Duration  8 weeks    OT Treatment/Interventions  Aquatic Therapy;Self-care/ADL training;Neuromuscular education;Therapeutic exercise;Functional Mobility Training;Balance training    Plan  aquatic therapy focused on NMR for RUE functional use, postural alignment and control, RLE strengtening and balance.     Consulted and Agree with Plan of Care  Patient       Patient will benefit from skilled therapeutic intervention in order to improve the following deficits and impairments:   Body Structure / Function / Physical  Skills: Balance, Mobility, UE functional use, Strength, IADL, Tone, ROM, GMC       Visit Diagnosis: Unsteadiness on feet  Spastic hemiplegia of right dominant side as late effect of other cerebrovascular disease (HCC)  Muscle weakness (generalized)  Abnormal posture  Stiffness of right shoulder, not elsewhere classified    Problem List Patient Active Problem List   Diagnosis Date Noted  . Depression 03/23/2017  . Anxiety 03/23/2017  . Muscle weakness 02/03/2017  . History of hemorrhagic  stroke with residual hemiparesis ([place]) 12/10/2016  . Spastic hemiplegia and hemiparesis affecting dominant side ([place]) 09/13/2015  . Adhesive capsulitis of right shoulder 08/24/2015  . Localization-related symptomatic epilepsy and epileptic syndromes with simple partial seizures, not intractable, without status epilepticus ([place]) 08/21/2015  . Nontraumatic cortical hemorrhage of cerebral hemisphere ([place])   . Seizure ([place])   . Seizures ([place])   . Adjustment disorder with depressed mood   . Contracture of muscle ankle and foot 06/11/2015  . Hemiplga fol ntrm intcrbl hemor aff right dominant side ([place]) 06/06/2015  . Left-sided intracerebral hemorrhage ([place]) 06/04/2015  . Seizure disorder as sequela of cerebrovascular accident ([place])   . Seizure disorder ([place]) 06/03/2015  . Sepsis ([place]) 06/03/2015  . UTI (urinary tract infection) 06/03/2015  . Hypotension 06/02/2015  . Right spastic hemiparesis ([place]) 05/28/2015  . Aphasia following nontraumatic intracerebral hemorrhage 05/28/2015  . History of anxiety disorder 05/28/2015  . ICH (intracerebral hemorrhage) ([place])   . Seizure disorder, nonconvulsive, with status epilepticus ([place])   . Cerebral venous thrombosis of cortical vein   . Cytotoxic cerebral edema ([place])   . IVH (intraventricular hemorrhage) ([place])   . Term pregnancy 05/07/2015  . Spontaneous vaginal delivery 05/07/2015    Quay Burow , OTR/L 07/20/2019, 6:03 PM  [facility] [address] [place], [place], [address] Phone: [phone]   Fax:  [phone]  Name: [place] [name] MRN: [id number] Date of Birth: 07/20/1976

## 2019-07-27 ENCOUNTER — Ambulatory Visit: Payer: Medicare Other | Admitting: Occupational Therapy

## 2019-07-27 ENCOUNTER — Other Ambulatory Visit: Payer: Self-pay

## 2019-07-27 ENCOUNTER — Encounter: Payer: Self-pay | Admitting: Occupational Therapy

## 2019-07-27 DIAGNOSIS — M25611 Stiffness of right shoulder, not elsewhere classified: Secondary | ICD-10-CM | POA: Diagnosis not present

## 2019-07-27 DIAGNOSIS — R293 Abnormal posture: Secondary | ICD-10-CM | POA: Diagnosis not present

## 2019-07-27 DIAGNOSIS — R296 Repeated falls: Secondary | ICD-10-CM | POA: Diagnosis not present

## 2019-07-27 DIAGNOSIS — I69851 Hemiplegia and hemiparesis following other cerebrovascular disease affecting right dominant side: Secondary | ICD-10-CM | POA: Diagnosis not present

## 2019-07-27 DIAGNOSIS — R2681 Unsteadiness on feet: Secondary | ICD-10-CM | POA: Diagnosis not present

## 2019-07-27 DIAGNOSIS — M6281 Muscle weakness (generalized): Secondary | ICD-10-CM | POA: Diagnosis not present

## 2019-07-27 DIAGNOSIS — M25671 Stiffness of right ankle, not elsewhere classified: Secondary | ICD-10-CM | POA: Diagnosis not present

## 2019-07-27 NOTE — Therapy (Signed)
[facility] [address] [place], [place], [address] Phone: [phone]   Fax:  [phone]  Occupational Therapy Treatment  Patient Details  Name: [place] [name] MRN: [id number] Date of Birth: 1975/12/27 Referring Provider (OT): Dr Marton Redwood   Encounter Date: 07/27/2019  OT End of Session - 07/27/19 1825    Visit Number  14    Number of Visits  16    Date for OT Re-Evaluation  08/17/19    Authorization Type  medicare with [employer] - will need PN every 10th visit    Authorization - Visit Number  14    Authorization - Number of Visits  20    OT Start Time  [id number]    OT Stop Time  1412    OT Time Calculation (min)  46 min    Activity Tolerance  Patient tolerated treatment well       Past Medical History:  Diagnosis Date  . Anxiety   . Depression   . ICH (intracerebral hemorrhage) ([place])   . Seizures ([place])   . Stroke [facility])     Past Surgical History:  Procedure Laterality Date  . DILATION AND CURETTAGE OF UTERUS      There were no vitals filed for this visit.  Subjective Assessment - 07/27/19 1822    Subjective   My arm is still stiff when I reach overhead and I know it isn't as strong    Pertinent History  see epic snapshot; pt with SAH 2 weeks after deliverying her 3rd child    Patient Stated Goals  strength of arm and get my R leg working better    Currently in Pain?  No/denies       Patient seen for aquatic therapy today.  Treatment took place in water 2.5-4 feet deep depending upon activity.  Pt entered the pool via steps using 2 hand railings and mod I.  Addressed functional ambulation in open water with no UE support with emphasis and min facilitation on postural alignment and control, decreasing exaggerated lateral weight shift to left during swing phase of RLE and increasing weight bearing and activation of RLE.  Pt requiring less facilitation for this activity and demonstrating carry over between  sessions as well as on land.  Aqua stretch techniques in supine and crouched standing (to submerge R shoulder girdle) to decrease spasticity in RUE, increase ROM and decrease discomfort with overhead reach.  Transitioned into prone with flotation devices as hips and chest to address overhead reach as well as increase RUE strength using pull downs, push ups and abduction pulls with UE while LE's remain still (several pool lengths).  Pt needed min a for core stability as well as min a for fluid movement in RUE in this position.  Pt tolerated all activities well.  Pt exited the pool via steps and 2 hand railings and mod I. Pt stated "I am so grateful that I am more comfortable in the pool because I want to be able to get into the pool with my kids and not be afraid."                      OT Short Term Goals - 07/27/19 1823      OT SHORT TERM GOAL #1   Title  Pt will demonstrate improved overhead reach to at least 130* of shoulder flexion RUE  to assist with heavier overhead home mgmt tasks -7/8//2020 (goal date adjusted to meet 4  visits)    Status  Achieved      OT SHORT TERM GOAL #2   Title  Pt will demonstrate improved RUE strength /core strength to assist with lifting heavy items in grocery store with greater ease (per pt report)    Status  Achieved      OT SHORT TERM GOAL #3   Title  Pt will demonstrate ability to lift and carry a 5 pound object in the grocery store with her RUE with no LOB - 06/22/2019 (date adjusted due to hold first two weeks in August due to scheduling)    Status  Achieved        OT Long Term Goals - 07/27/19 1823      OT LONG TERM GOAL #1   Title  Pt will be mod I with upgraded HEP to include aquatic based HEP - 08/17/2019 (date adjusted as pt missed 3 weeks of therapy due to scheduling)    Status  On-going      OT LONG TERM GOAL #2   Title  Pt will demonstrate improved postural alignment and control to reduce falls (has been falling at least  1x/month)    Status  On-going            Plan - 07/27/19 1824    Clinical Impression Statement  Pt continues to make progress in terms of RUE ROM, strength and functional use as well as improved postural alignment and control    OT Occupational Profile and History  Problem Focused Assessment - Including review of records relating to presenting problem    Occupational performance deficits (Please refer to evaluation for details):  IADL's;Leisure;Social Participation    Body Structure / Function / Physical Skills  Balance;Mobility;UE functional use;Strength;IADL;Tone;ROM;GMC    Rehab Potential  Good    Clinical Decision Making  Limited treatment options, no task modification necessary    Comorbidities Affecting Occupational Performance:  May have comorbidities impacting occupational performance    Modification or Assistance to Complete Evaluation   No modification of tasks or assist necessary to complete eval    OT Frequency  1x / week    OT Duration  8 weeks    OT Treatment/Interventions  Aquatic Therapy;Self-care/ADL training;Neuromuscular education;Therapeutic exercise;Functional Mobility Training;Balance training    Plan  aquatic therapy focused on NMR for RUE functional use, postural alignment and control, RLE strengtening and balance.     Consulted and Agree with Plan of Care  Patient       Patient will benefit from skilled therapeutic intervention in order to improve the following deficits and impairments:   Body Structure / Function / Physical Skills: Balance, Mobility, UE functional use, Strength, IADL, Tone, ROM, GMC       Visit Diagnosis: Unsteadiness on feet  Spastic hemiplegia of right dominant side as late effect of other cerebrovascular disease (HCC)  Muscle weakness (generalized)  Abnormal posture  Stiffness of right shoulder, not elsewhere classified    Problem List Patient Active Problem List   Diagnosis Date Noted  . Depression 03/23/2017  . Anxiety  03/23/2017  . Muscle weakness 02/03/2017  . History of hemorrhagic stroke with residual hemiparesis ([place]) 12/10/2016  . Spastic hemiplegia and hemiparesis affecting dominant side ([place]) 09/13/2015  . Adhesive capsulitis of right shoulder 08/24/2015  . Localization-related symptomatic epilepsy and epileptic syndromes with simple partial seizures, not intractable, without status epilepticus ([place]) 08/21/2015  . Nontraumatic cortical hemorrhage of cerebral hemisphere ([place])   . Seizure ([place])   . Seizures ([place])   .  Adjustment disorder with depressed mood   . Contracture of muscle ankle and foot 06/11/2015  . Hemiplga fol ntrm intcrbl hemor aff right dominant side ([place]) 06/06/2015  . Left-sided intracerebral hemorrhage ([place]) 06/04/2015  . Seizure disorder as sequela of cerebrovascular accident ([place])   . Seizure disorder ([place]) 06/03/2015  . Sepsis ([place]) 06/03/2015  . UTI (urinary tract infection) 06/03/2015  . Hypotension 06/02/2015  . Right spastic hemiparesis ([place]) 05/28/2015  . Aphasia following nontraumatic intracerebral hemorrhage 05/28/2015  . History of anxiety disorder 05/28/2015  . ICH (intracerebral hemorrhage) ([place])   . Seizure disorder, nonconvulsive, with status epilepticus ([place])   . Cerebral venous thrombosis of cortical vein   . Cytotoxic cerebral edema ([place])   . IVH (intraventricular hemorrhage) ([place])   . Term pregnancy 05/07/2015  . Spontaneous vaginal delivery 05/07/2015    Quay Burow, OTR/L 07/27/2019, 6:26 PM  [facility] [address] [place], [place], [address] Phone: [phone]   Fax:  [phone]  Name: [name] MRN: [id number] Date of Birth: 06-27-1976

## 2019-07-28 ENCOUNTER — Ambulatory Visit: Payer: Medicare Other | Admitting: Physical Therapy

## 2019-07-28 ENCOUNTER — Encounter: Payer: Self-pay | Admitting: Physical Therapy

## 2019-07-28 DIAGNOSIS — R293 Abnormal posture: Secondary | ICD-10-CM | POA: Diagnosis not present

## 2019-07-28 DIAGNOSIS — M25671 Stiffness of right ankle, not elsewhere classified: Secondary | ICD-10-CM

## 2019-07-28 DIAGNOSIS — R296 Repeated falls: Secondary | ICD-10-CM | POA: Diagnosis not present

## 2019-07-28 DIAGNOSIS — M25611 Stiffness of right shoulder, not elsewhere classified: Secondary | ICD-10-CM | POA: Diagnosis not present

## 2019-07-28 DIAGNOSIS — R2681 Unsteadiness on feet: Secondary | ICD-10-CM

## 2019-07-28 DIAGNOSIS — I69851 Hemiplegia and hemiparesis following other cerebrovascular disease affecting right dominant side: Secondary | ICD-10-CM | POA: Diagnosis not present

## 2019-07-28 DIAGNOSIS — M6281 Muscle weakness (generalized): Secondary | ICD-10-CM | POA: Diagnosis not present

## 2019-07-28 NOTE — Therapy (Signed)
[facility] [address] [place], [place], [address] Phone: [phone]   Fax:  [phone]  Physical Therapy Treatment  Patient Details  Name: Ashley [name] MRN: [id number] Date of Birth: [birth year] Referring Provider (PT): Marton Redwood, MD   Encounter Date: 07/28/2019  PT End of Session - 07/28/19 [id number]    Visit Number  18    Number of Visits  24    Date for PT Re-Evaluation  09/06/19   delayed date due to lack of appointment times   Authorization Type  Medicare and BCBS - 10th visit PN required.  30 VL for OT; 30 VL for PT - 3 used    Authorization - Visit Number  21    Authorization - Number of Visits  30    PT Start Time  [id number]    PT Stop Time  1015    PT Time Calculation (min)  40 min    Activity Tolerance  Patient tolerated treatment well    Behavior During Therapy  WFL for tasks assessed/performed       Past Medical History:  Diagnosis Date  . Anxiety   . Depression   . ICH (intracerebral hemorrhage) ([place])   . Seizures ([place])   . Stroke [facility])     Past Surgical History:  Procedure Laterality Date  . DILATION AND CURETTAGE OF UTERUS      There were no vitals filed for this visit.  Subjective Assessment - 07/28/19 0938    Subjective  Aquatic still going well.  Can lie on her back and stomach without assistance now.  OT asked pt to ask PT about gapping of AFO from calf.    Pertinent History  : L CVA with ICH, R hemiplegia, contracture of R ankle muscles, R shoulder adhesive capsulitis, multiple falls with injury, seizures, depression and anxiety    Limitations  Standing;Walking    Patient Stated Goals  More stability, prevent falls, would like to be able to run    Currently in Pain?  No/denies                       [facility] Adult PT Treatment/Exercise - 07/28/19 0954      Therapeutic Activites    Therapeutic Activities  Other Therapeutic Activities    Other Therapeutic Activities   wedge added under AFO to prevent gapping; had pt tighten T straps in WB position -Crouched- to seat foot further down in shoe.  Had pt ambulate around gym x 2 to assess - no gapping between calf and AFO but pt reporting heel sliding out of shoe slightly.  Will have pt trial using heel wedge today and will reassess tomorrow.  Also discussed leather toe cap again and showed pt example of shoe with leather toe cap - pt thought leather came over top of the shoe - pt is in agreement to get leather toe cap on NB shoes to prevent toe drag and falls.      Knee/Hip Exercises: Standing   Hip Abduction  Stengthening;Right;Left;1 set;10 reps;Knee straight    Abduction Limitations  Standing with R foot on step for closed chain hip ABD with verbal cues to keep hips level while performing open chain hip ABD with LLE    Step Down  Left;Right;10 reps;Hand Hold: 2;Step Height: 6"    Step Down Limitations  Performed lateral step downs standing on RLE on step and L foot tapping down forwards, to the side and back for  R proximal hip and quad strengthening.  Turned and performed forward step downs with LLE with pt continuing to keep R pelvis retracted.  Added in theraband around pelvis and peformed L step downs and then R step downs with pt pushing into theraband to initiate pelvic anterior rotation and weight shift forwards when performing step downs.  Progressed to do reciprocal step downs on stairs against resistance of theraband x 5 reps with improvement in weight shift and decreased R hip ER during stance             PT Education - 07/28/19 1221    Education provided  Yes    Education Details  leather toe cap; adjustment for AFO fit - if heel wedge does not work will try to make calf pad thicker    Person(s) Educated  Patient    Methods  Explanation    Comprehension  Verbalized understanding       PT Short Term Goals - 07/08/19 1122      PT SHORT TERM GOAL #1   Title  = LTG        PT Long Term Goals  - 07/08/19 1116      PT LONG TERM GOAL #1   Title  Pt will demonstrate independence with final HEP and improved RLE mechanics on elliptical when using for aerobic exercise (date reflects delay in start of new visits; 4 week total POC)    Time  4    Period  Weeks    Status  New    Target Date  09/06/19      PT LONG TERM GOAL #2   Title  Pt will ambulate 1,000 outside over uneven terrain, negotiate curbs and over large obstacles MOD I with AFO and leather toe cap safely without catching R toe    Baseline  --    Time  4    Period  Weeks    Status  New    Target Date  09/06/19      PT LONG TERM GOAL #3   Title  Pt will demonstrate improvement in gait velocity with new AFO and leather toe cap to 4.57ft/sec    Baseline  3.8 with leather toe cap    Time  4    Period  Weeks    Status  Revised    Target Date  09/06/19      PT LONG TERM GOAL #4   Title  Pt will negotiate 12 stairs with one rail MOD I alternating sequence while maintaining R hip in neutral rotation due to improved weight shifting.    Baseline  --    Time  4    Period  Weeks    Status  New    Target Date  09/06/19      PT LONG TERM GOAL #5   Title  --    Time  --    Period  --    Status  --            Plan - 07/28/19 1222    Clinical Impression Statement  Continued to problem solve ways to improve fit of AFO with heel wedge to prevent gapping.  Will trial for one day to see if it causes rubbing and friction against achilles.  Continued to educate pt on benefit of leather toe cap; showed pt example on real shoe - pt is agreeable and will drop off at [facility].  Continued proximal hip strengthening, pelvic rotation and weight shift training  to improve mechanics and safety with descending stairs.  Will continue to address tomorrow.    Personal Factors and Comorbidities  Comorbidity 2;Time since onset of injury/illness/exacerbation;Age    Comorbidities  L CVA with ICH, R hemiplegia, contracture of R ankle muscles, R  shoulder adhesive capsulitis, multiple falls with injury, seizures, depression and anxiety, pt is a mother to young children    Examination-Activity Limitations  Locomotion Level;Squat;Stairs;Stand;Caring for Others    Examination-Participation Restrictions  Cleaning;Shop;Community Activity    Stability/Clinical Decision Making  Evolving/Moderate complexity    Rehab Potential  Good    Clinical Impairments Affecting Rehab Potential  time since onset, mm contracture in R ankle    PT Frequency  2x / week    PT Duration  4 weeks    PT Treatment/Interventions  ADLs/Self Care Home Management;Electrical Stimulation;Gait training;Stair training;Functional mobility training;Therapeutic activities;Therapeutic exercise;Balance training;Neuromuscular re-education;Patient/family education;Orthotic Fit/Training;Passive range of motion;Dry needling;Taping    PT Next Visit Plan  R SLS activities; lateral step downs in various directions.  leather toe cap to R shoe - did she try brooks with AFO at home.  descending/step downs (also with Bioness); Weight shift fully L for full step length RLE - needs to activate quad during terminal swing;  Bioness with treadmill,  Stepping over obstacles, gait on pavement, balance reactions if foot catches.  Bioness and use with elliptical; sit > stand bringing weight forwards over foot; closed chain hip ABD strengthening, continue intense stretching program for RLE - gastroc, posterior tib, hamstring, hip ER, hip flexor.  RLE strengthening - RLE PNF patterns into IR/extension; especially hip EXT and ABD/IR  Balance training - rockerboard.    Consulted and Agree with Plan of Care  Patient       Patient will benefit from skilled therapeutic intervention in order to improve the following deficits and impairments:  Abnormal gait, Decreased balance, Decreased mobility, Decreased range of motion, Decreased strength, Difficulty walking, Hypomobility, Increased muscle spasms, Impaired  flexibility, Impaired tone, Impaired sensation  Visit Diagnosis: Unsteadiness on feet  Spastic hemiplegia of right dominant side as late effect of other cerebrovascular disease (HCC)  Abnormal posture  Muscle weakness (generalized)  Stiffness of right ankle, not elsewhere classified  Repeated falls     Problem List Patient Active Problem List   Diagnosis Date Noted  . Depression 03/23/2017  . Anxiety 03/23/2017  . Muscle weakness 02/03/2017  . History of hemorrhagic stroke with residual hemiparesis ([place]) 12/10/2016  . Spastic hemiplegia and hemiparesis affecting dominant side ([place]) 09/13/2015  . Adhesive capsulitis of right shoulder 08/24/2015  . Localization-related symptomatic epilepsy and epileptic syndromes with simple partial seizures, not intractable, without status epilepticus ([place]) 08/21/2015  . Nontraumatic cortical hemorrhage of cerebral hemisphere ([place])   . Seizure ([place])   . Seizures ([place])   . Adjustment disorder with depressed mood   . Contracture of muscle ankle and foot 06/11/2015  . Hemiplga fol ntrm intcrbl hemor aff right dominant side ([place]) 06/06/2015  . Left-sided intracerebral hemorrhage ([place]) 06/04/2015  . Seizure disorder as sequela of cerebrovascular accident ([place])   . Seizure disorder ([place]) 06/03/2015  . Sepsis ([place]) 06/03/2015  . UTI (urinary tract infection) 06/03/2015  . Hypotension 06/02/2015  . Right spastic hemiparesis ([place]) 05/28/2015  . Aphasia following nontraumatic intracerebral hemorrhage 05/28/2015  . History of anxiety disorder 05/28/2015  . ICH (intracerebral hemorrhage) ([place])   . Seizure disorder, nonconvulsive, with status epilepticus ([place])   . Cerebral venous thrombosis of cortical vein   . Cytotoxic cerebral  edema ([place])   . IVH (intraventricular hemorrhage) ([place])   . Term pregnancy 05/07/2015  . Spontaneous vaginal delivery 05/07/2015    [name], PT, DPT 07/28/19    12:26 PM    [facility] [address] [place], [place], [address] Phone: [phone]   Fax:  [phone]  Name: Caedance Zupan MRN: [id number] Date of Birth: 1976-07-24

## 2019-07-29 ENCOUNTER — Other Ambulatory Visit: Payer: Self-pay

## 2019-07-29 ENCOUNTER — Encounter: Payer: Self-pay | Admitting: Physical Therapy

## 2019-07-29 ENCOUNTER — Ambulatory Visit: Payer: Medicare Other | Admitting: Physical Therapy

## 2019-07-29 DIAGNOSIS — I69851 Hemiplegia and hemiparesis following other cerebrovascular disease affecting right dominant side: Secondary | ICD-10-CM

## 2019-07-29 DIAGNOSIS — R296 Repeated falls: Secondary | ICD-10-CM | POA: Diagnosis not present

## 2019-07-29 DIAGNOSIS — R293 Abnormal posture: Secondary | ICD-10-CM | POA: Diagnosis not present

## 2019-07-29 DIAGNOSIS — R2681 Unsteadiness on feet: Secondary | ICD-10-CM

## 2019-07-29 DIAGNOSIS — M6281 Muscle weakness (generalized): Secondary | ICD-10-CM

## 2019-07-29 DIAGNOSIS — M25671 Stiffness of right ankle, not elsewhere classified: Secondary | ICD-10-CM

## 2019-07-29 DIAGNOSIS — M25611 Stiffness of right shoulder, not elsewhere classified: Secondary | ICD-10-CM | POA: Diagnosis not present

## 2019-07-30 NOTE — Therapy (Signed)
[facility] [address] [place], [place], [address] Phone: [phone]   Fax:  [phone]  Physical Therapy Treatment  Patient Details  Name: Ashley [name] MRN: [id number] Date of Birth: [birth year] Referring Provider (PT): Marton Redwood, MD   Encounter Date: 07/29/2019  PT End of Session - 07/30/19 1203    Visit Number  19    Number of Visits  24    Date for PT Re-Evaluation  09/06/19   delayed date due to lack of appointment times   Authorization Type  Medicare and BCBS - 10th visit PN required.  30 VL for OT; 30 VL for PT - 3 used    Authorization - Visit Number  22    Authorization - Number of Visits  30    PT Start Time  [id number]    PT Stop Time  1100    PT Time Calculation (min)  45 min    Activity Tolerance  Patient tolerated treatment well    Behavior During Therapy  WFL for tasks assessed/performed       Past Medical History:  Diagnosis Date  . Anxiety   . Depression   . ICH (intracerebral hemorrhage) ([place])   . Seizures ([place])   . Stroke [facility])     Past Surgical History:  Procedure Laterality Date  . DILATION AND CURETTAGE OF UTERUS      There were no vitals filed for this visit.  Subjective Assessment - 07/29/19 1020    Subjective  Dropped shoes off yesterday to get leather toe cap.  Is wearing Rolena Infante today - able to switch heel wedge without issues.  No rubbing on heel from wearing wedge.    Pertinent History  : L CVA with ICH, R hemiplegia, contracture of R ankle muscles, R shoulder adhesive capsulitis, multiple falls with injury, seizures, depression and anxiety    Limitations  Standing;Walking    Patient Stated Goals  More stability, prevent falls, would like to be able to run    Currently in Pain?  No/denies                       [facility] Adult PT Treatment/Exercise - 07/30/19 1204      Neuro Re-ed    Neuro Re-ed Details   L sidelying performed pelvic PNF patterns into anterior  and posterior rotation x 10 reps, added in resistance to pelvic movements x 10 reps; progressed to adding in LE flexion <> extension with pelvic rotation with therapist assisting with supporting RLE x 10 reps prior to gait training and step down training      Knee/Hip Exercises: Standing   Forward Step Up  5 reps;Hand Hold: 0;Step Height: 4";Right    Forward Step Up Limitations  step placed outside bars and had pt initiate walking with step up with RLE and step down over the other side with LLE to further progress anterior weight shifting and incorporate gait and maintaining forward momentum; required min A for balance when stepping down and stepping R foot through    Step Down  Left;4 sets;10 reps;Hand Hold: 2;Hand Hold: 1;Hand Hold: 0;Step Height: 4"    Step Down Limitations  in // bars on 4" step performed LLE step downs focusing on R pelvic anterior rotation and weight shifting against resistance of black theraband for increased activation; when progressed to one or no UE support, removed resistance and focused on balance when stepping down.  Transitioned to step down with LLE  and step through with RLE to continue to progress anterior weight shift and balance training.    Functional Squat  2 sets    Functional Squat Limitations  Squat with forwards and backwards walking while squeezing ball between knees equally to facilitate hip IR and adduction, hamstring activation and controlled lateral and diagonal weight shifting; performed in // bars x 2 laps             PT Education - 07/30/19 1202    Education provided  Yes    Education Details  keep wearing heel wedge and assess skin response; descending stairs    Person(s) Educated  Patient    Methods  Explanation    Comprehension  Verbalized understanding       PT Short Term Goals - 07/08/19 1122      PT SHORT TERM GOAL #1   Title  = LTG        PT Long Term Goals - 07/08/19 1116      PT LONG TERM GOAL #1   Title  Pt will  demonstrate independence with final HEP and improved RLE mechanics on elliptical when using for aerobic exercise (date reflects delay in start of new visits; 4 week total POC)    Time  4    Period  Weeks    Status  New    Target Date  09/06/19      PT LONG TERM GOAL #2   Title  Pt will ambulate 1,000 outside over uneven terrain, negotiate curbs and over large obstacles MOD I with AFO and leather toe cap safely without catching R toe    Baseline  --    Time  4    Period  Weeks    Status  New    Target Date  09/06/19      PT LONG TERM GOAL #3   Title  Pt will demonstrate improvement in gait velocity with new AFO and leather toe cap to 4.25ft/sec    Baseline  3.8 with leather toe cap    Time  4    Period  Weeks    Status  Revised    Target Date  09/06/19      PT LONG TERM GOAL #4   Title  Pt will negotiate 12 stairs with one rail MOD I alternating sequence while maintaining R hip in neutral rotation due to improved weight shifting.    Baseline  --    Time  4    Period  Weeks    Status  New    Target Date  09/06/19      PT LONG TERM GOAL #5   Title  --    Time  --    Period  --    Status  --            Plan - 07/30/19 1252    Clinical Impression Statement  Pt demonstrates no issues with wearing AFO and heel wedge; continued NMR and gait training to continued to focus on weight shifting, balance reactions and step down/step through to improve patient's balance and safety when descending stairs and over more uneven ground.  Continues to require assistance and cues to adduct and internally rotate hip during anterior weight shift with ER occuring due to ongoing supination at R foot.  Will continue to address and progress towards LTG.    Personal Factors and Comorbidities  Comorbidity 2;Time since onset of injury/illness/exacerbation;Age    Comorbidities  L CVA with ICH, R  hemiplegia, contracture of R ankle muscles, R shoulder adhesive capsulitis, multiple falls with injury,  seizures, depression and anxiety, pt is a mother to young children    Examination-Activity Limitations  Locomotion Level;Squat;Stairs;Stand;Caring for Others    Examination-Participation Restrictions  Cleaning;Shop;Community Activity    Stability/Clinical Decision Making  Evolving/Moderate complexity    Rehab Potential  Good    Clinical Impairments Affecting Rehab Potential  time since onset, mm contracture in R ankle    PT Frequency  2x / week    PT Duration  4 weeks    PT Treatment/Interventions  ADLs/Self Care Home Management;Electrical Stimulation;Gait training;Stair training;Functional mobility training;Therapeutic activities;Therapeutic exercise;Balance training;Neuromuscular re-education;Patient/family education;Orthotic Fit/Training;Passive range of motion;Dry needling;Taping    PT Next Visit Plan  wall squats with adduction, squat walks with adduction; R SLS activities; descending/step downs (also with Bioness); Weight shift fully L for full step length RLE - needs to activate quad during terminal swing;  Bioness with treadmill,  Stepping over obstacles, gait on pavement, balance reactions if foot catches.  Bioness and use with elliptical; sit > stand bringing weight forwards over foot; closed chain hip ABD strengthening, continue intense stretching program for RLE - gastroc, posterior tib, hamstring, hip ER, hip flexor.  RLE strengthening - RLE PNF patterns into IR/extension; especially hip EXT and ABD/IR  Balance training - rockerboard.    Consulted and Agree with Plan of Care  Patient       Patient will benefit from skilled therapeutic intervention in order to improve the following deficits and impairments:  Abnormal gait, Decreased balance, Decreased mobility, Decreased range of motion, Decreased strength, Difficulty walking, Hypomobility, Increased muscle spasms, Impaired flexibility, Impaired tone, Impaired sensation  Visit Diagnosis: Unsteadiness on feet  Spastic hemiplegia of  right dominant side as late effect of other cerebrovascular disease (HCC)  Muscle weakness (generalized)  Stiffness of right ankle, not elsewhere classified  Repeated falls     Problem List Patient Active Problem List   Diagnosis Date Noted  . Depression 03/23/2017  . Anxiety 03/23/2017  . Muscle weakness 02/03/2017  . History of hemorrhagic stroke with residual hemiparesis ([place]) 12/10/2016  . Spastic hemiplegia and hemiparesis affecting dominant side ([place]) 09/13/2015  . Adhesive capsulitis of right shoulder 08/24/2015  . Localization-related symptomatic epilepsy and epileptic syndromes with simple partial seizures, not intractable, without status epilepticus ([place]) 08/21/2015  . Nontraumatic cortical hemorrhage of cerebral hemisphere ([place])   . Seizure ([place])   . Seizures ([place])   . Adjustment disorder with depressed mood   . Contracture of muscle ankle and foot 06/11/2015  . Hemiplga fol ntrm intcrbl hemor aff right dominant side ([place]) 06/06/2015  . Left-sided intracerebral hemorrhage ([place]) 06/04/2015  . Seizure disorder as sequela of cerebrovascular accident ([place])   . Seizure disorder ([place]) 06/03/2015  . Sepsis ([place]) 06/03/2015  . UTI (urinary tract infection) 06/03/2015  . Hypotension 06/02/2015  . Right spastic hemiparesis ([place]) 05/28/2015  . Aphasia following nontraumatic intracerebral hemorrhage 05/28/2015  . History of anxiety disorder 05/28/2015  . ICH (intracerebral hemorrhage) ([place])   . Seizure disorder, nonconvulsive, with status epilepticus ([place])   . Cerebral venous thrombosis of cortical vein   . Cytotoxic cerebral edema ([place])   . IVH (intraventricular hemorrhage) ([place])   . Term pregnancy 05/07/2015  . Spontaneous vaginal delivery 05/07/2015    [name], PT, DPT 07/30/19    12:57 PM    [facility] [address] [place], [place], [address] Phone: [phone]  Fax:  [phone]  Name:  Saydee Ririe MRN: [id number] Date of Birth: [birth year]

## 2019-08-01 DIAGNOSIS — Z0000 Encounter for general adult medical examination without abnormal findings: Secondary | ICD-10-CM | POA: Diagnosis not present

## 2019-08-01 DIAGNOSIS — J181 Lobar pneumonia, unspecified organism: Secondary | ICD-10-CM | POA: Diagnosis not present

## 2019-08-01 DIAGNOSIS — I69351 Hemiplegia and hemiparesis following cerebral infarction affecting right dominant side: Secondary | ICD-10-CM | POA: Diagnosis not present

## 2019-08-02 ENCOUNTER — Encounter: Payer: Self-pay | Admitting: Physical Therapy

## 2019-08-02 ENCOUNTER — Other Ambulatory Visit: Payer: Self-pay

## 2019-08-02 ENCOUNTER — Ambulatory Visit: Payer: Medicare Other | Admitting: Physical Therapy

## 2019-08-02 DIAGNOSIS — R2681 Unsteadiness on feet: Secondary | ICD-10-CM | POA: Diagnosis not present

## 2019-08-02 DIAGNOSIS — I69851 Hemiplegia and hemiparesis following other cerebrovascular disease affecting right dominant side: Secondary | ICD-10-CM | POA: Diagnosis not present

## 2019-08-02 DIAGNOSIS — M6281 Muscle weakness (generalized): Secondary | ICD-10-CM

## 2019-08-02 DIAGNOSIS — R296 Repeated falls: Secondary | ICD-10-CM | POA: Diagnosis not present

## 2019-08-02 DIAGNOSIS — M25671 Stiffness of right ankle, not elsewhere classified: Secondary | ICD-10-CM | POA: Diagnosis not present

## 2019-08-02 DIAGNOSIS — R293 Abnormal posture: Secondary | ICD-10-CM | POA: Diagnosis not present

## 2019-08-02 DIAGNOSIS — M25611 Stiffness of right shoulder, not elsewhere classified: Secondary | ICD-10-CM | POA: Diagnosis not present

## 2019-08-02 NOTE — Patient Instructions (Signed)
Gastroc / Heel Cord Stretch - On Step    Stand with heels over edge of stair. Holding rail, lower heels until stretch is felt in calf of legs. Repeat _3__ times. Do _2__ times per day.   Step-Down: Forward    Stand on first step and lower left leg as far as possible to floor until you feel a stretch in your right calf.  Try to keep your knee pointing forwards as much as you can.  Hold for 10 seconds and then rest.  Repeat 10 times    Knee Extension: Step-Down Forward / Sideways / Backward (Eccentric)    Stand, holding support, RIGHT foot on step. Slowly bend RIGHT knee, keeping the knee forwards and bring left heel out to the side and down to floor.  Straighten RIGHT leg.  _10__ reps per set, _2__ sets per day, _5__ days per week.   Wall Squat    Feet shoulder width apart, squeezing a flat soccer ball between the knees, step feet forwards - _12___ inches in front of wall, lean against wall. Heels on floor, knees parallel, bend hips and knees to almost 90. Hold _10___ seconds and then return to standing.  Keep thighs squeezing yoga block the whole time. Repeat __6-8__ times. Do __1-2__ sessions per day.   HIP / KNEE: Extension - Modified Sit to Stand    PROGRESSION: Place flat soccer ball between knees.  Squeeze ball equally and then Lean forward, raise hips up from surface - keep knees over feet and pause for a couple of seconds. Straighten hips and knees. Weight bear equally on left and right sides.  Maintain the squeeze as you stand up and sit back down. _10__ reps per set, __2_ sets per day.   Hip Internal Rotation (Eccentric) - Prone, Knees Flexed to 90 Degrees    Lie on stomach with Right knee flexed to 90. Have a belt around your foot and pulling on outside of foot.  Slowly lower legs outward and heel towards buttocks x 30 seconds. Relax and then perform again. _4__ reps per set, __2_ sets per day, __7_ days per week.   Knee Extension: Standing (Single Leg)     In stride stance, anchor tubing around table leg and loop around ankle of right foot. Have left leg positioned forwards.  Bring right knee up in the air and kick foot forwards with control placing heel on floor.  Bring leg back with control. Repeat _10_ times per set. Repeat with other leg. Do _2_ sets per session. Do _5_ sessions per week.   Reverse Clam    SITTING: SQUEEZE BLOCK BETWEEN KNEES, ROTATE RIGHT FOOT OUT TO THE SIDE, HOLD AND THEN STRAIGHTEN RIGHT KNEE.  RELAX  REPEAT 10-12 TIMES

## 2019-08-02 NOTE — Therapy (Signed)
[facility] [address] [place], [place], [address] Phone: [phone]   Fax:  [phone]  Physical Therapy Treatment and 10th visit Progress Note  Patient Details  Name: Ashley [name] MRN: [id number] Date of Birth: 04/22/1976 Referring Provider (PT): Marton Redwood, MD   Encounter Date: 08/02/2019   Progress Note Reporting Period 06/17/19 to 08/02/19  See note below for Objective Data and Assessment of Progress/Goals.    PT End of Session - 08/02/19 1453    Visit Number  20    Number of Visits  24    Date for PT Re-Evaluation  09/06/19   delayed date due to lack of appointment times   Authorization Type  Medicare and BCBS - 10th visit PN required.  30 VL for OT; 30 VL for PT - 3 used    Authorization - Visit Number  23    Authorization - Number of Visits  30    PT Start Time  [id number]    PT Stop Time  1318    PT Time Calculation (min)  45 min    Activity Tolerance  Patient tolerated treatment well    Behavior During Therapy  WFL for tasks assessed/performed       Past Medical History:  Diagnosis Date  . Anxiety   . Depression   . ICH (intracerebral hemorrhage) ([place])   . Seizures ([place])   . Stroke [facility])     Past Surgical History:  Procedure Laterality Date  . DILATION AND CURETTAGE OF UTERUS      There were no vitals filed for this visit.  Subjective Assessment - 08/02/19 1238    Subjective  No issues to report; went to two soccer games over the weekend.  Wore shoes with leather toe cap to soccer games. no toe drag.  More botox injections on Tuesday.    Pertinent History  : L CVA with ICH, R hemiplegia, contracture of R ankle muscles, R shoulder adhesive capsulitis, multiple falls with injury, seizures, depression and anxiety    Limitations  Standing;Walking    Patient Stated Goals  More stability, prevent falls, would like to be able to run    Currently in Pain?  No/denies                       [facility] Adult PT Treatment/Exercise - 08/02/19 1241      Exercises   Exercises  Other Exercises    Other Exercises   To isolate hip IR and knee extension performed seated reverse clam shells with pt squeezing yoga block between knees, IR at hip to rotate foot out and then added in knee extension x 12 reps      Knee/Hip Exercises: Standing   Functional Squat  2 sets    Functional Squat Limitations  squat walking forwards x 30 x 2 squeezing yoga blocks between knees to maintain hip IR with therapist providing assistance for stability and weight shifting to R.  Still unable to isolate knee extension on R from hip flexion.    Wall Squat  1 set;5 reps;10 seconds    Wall Squat Limitations  squeezing yoga block and shifting pelvis/hips to R to maintain hip IR with min A and verbal cues      Ankle Exercises: Stretches   Gastroc Stretch  30 seconds;5 reps    Gastroc Stretch Limitations  on Prostretch rocker in standing rocking back while therapist held RLE in IR and then added in squat  down and slightly to the R with therapist assisting to keep hip IR             PT Education - 08/02/19 1453    Education provided  Yes    Education Details  hip ADD/IR with combined knee extension exercise    Person(s) Educated  Patient    Methods  Explanation;Demonstration;Handout    Comprehension  Verbalized understanding;Returned demonstration      Gastroc / Heel Cord Stretch - On Step    Stand with heels over edge of stair. Holding rail, lower heels until stretch is felt in calf of legs. Repeat _3__ times. Do _2__ times per day.   Step-Down: Forward    Stand on first step and lower left leg as far as possible to floor until you feel a stretch in your right calf.  Try to keep your knee pointing forwards as much as you can.  Hold for 10 seconds and then rest.  Repeat 10 times    Knee Extension: Step-Down Forward / Sideways / Backward (Eccentric)    Stand, holding support, RIGHT foot  on step. Slowly bend RIGHT knee, keeping the knee forwards and bring left heel out to the side and down to floor.  Straighten RIGHT leg.  _10__ reps per set, _2__ sets per day, _5__ days per week.   Wall Squat    Feet shoulder width apart, squeezing a flat soccer ball between the knees, step feet forwards - _12___ inches in front of wall, lean against wall. Heels on floor, knees parallel, bend hips and knees to almost 90. Hold _10___ seconds and then return to standing.  Keep thighs squeezing yoga block the whole time. Repeat __6-8__ times. Do __1-2__ sessions per day.   HIP / KNEE: Extension - Modified Sit to Stand    PROGRESSION: Place flat soccer ball between knees.  Squeeze ball equally and then Lean forward, raise hips up from surface - keep knees over feet and pause for a couple of seconds. Straighten hips and knees. Weight bear equally on left and right sides.  Maintain the squeeze as you stand up and sit back down. _10__ reps per set, __2_ sets per day.   Hip Internal Rotation (Eccentric) - Prone, Knees Flexed to 90 Degrees    Lie on stomach with Right knee flexed to 90. Have a belt around your foot and pulling on outside of foot.  Slowly lower legs outward and heel towards buttocks x 30 seconds. Relax and then perform again. _4__ reps per set, __2_ sets per day, __7_ days per week.   Knee Extension: Standing (Single Leg)    In stride stance, anchor tubing around table leg and loop around ankle of right foot. Have left leg positioned forwards.  Bring right knee up in the air and kick foot forwards with control placing heel on floor.  Bring leg back with control. Repeat _10_ times per set. Repeat with other leg. Do _2_ sets per session. Do _5_ sessions per week.   Reverse Clam    SITTING: SQUEEZE BLOCK BETWEEN KNEES, ROTATE RIGHT FOOT OUT TO THE SIDE, HOLD AND THEN STRAIGHTEN RIGHT KNEE.  RELAX  REPEAT 10-12 TIMES   PT Short Term Goals - 07/08/19 1122      PT  SHORT TERM GOAL #1   Title  = LTG        PT Long Term Goals - 07/08/19 1116      PT LONG TERM GOAL #1   Title  Pt will demonstrate independence with final HEP and improved RLE mechanics on elliptical when using for aerobic exercise (date reflects delay in start of new visits; 4 week total POC)    Time  4    Period  Weeks    Status  New    Target Date  09/06/19      PT LONG TERM GOAL #2   Title  Pt will ambulate 1,000 outside over uneven terrain, negotiate curbs and over large obstacles MOD I with AFO and leather toe cap safely without catching R toe    Baseline  --    Time  4    Period  Weeks    Status  New    Target Date  09/06/19      PT LONG TERM GOAL #3   Title  Pt will demonstrate improvement in gait velocity with new AFO and leather toe cap to 4.7ft/sec    Baseline  3.8 with leather toe cap    Time  4    Period  Weeks    Status  Revised    Target Date  09/06/19      PT LONG TERM GOAL #4   Title  Pt will negotiate 12 stairs with one rail MOD I alternating sequence while maintaining R hip in neutral rotation due to improved weight shifting.    Baseline  --    Time  4    Period  Weeks    Status  New    Target Date  09/06/19      PT LONG TERM GOAL #5   Title  --    Time  --    Period  --    Status  --            Plan - 08/02/19 1454    Clinical Impression Statement  Continued to address RLE spasticity and synergistic movements with functional WB exercises focusing on increasing ROM and strengthening of R ankle DF, hip IR and knee extension.  Performed without AFO on to allow greater ankle ROM.  At end of session pt demonstrated improved gait sequence with decreased use of combined hip flexion and ER to advance RLE -demonstrated hip flexion with hip in neutral rotation and more isolated knee extension during terminal swing phase.  Pt also demonstrates decreased catching of R toe decreasing risk for falls.    Personal Factors and Comorbidities  Comorbidity 2;Time  since onset of injury/illness/exacerbation;Age    Comorbidities  L CVA with ICH, R hemiplegia, contracture of R ankle muscles, R shoulder adhesive capsulitis, multiple falls with injury, seizures, depression and anxiety, pt is a mother to young children    Examination-Activity Limitations  Locomotion Level;Squat;Stairs;Stand;Caring for Others    Examination-Participation Restrictions  Cleaning;Shop;Community Activity    Stability/Clinical Decision Making  Evolving/Moderate complexity    Rehab Potential  Good    Clinical Impairments Affecting Rehab Potential  time since onset, mm contracture in R ankle    PT Frequency  2x / week    PT Duration  4 weeks    PT Treatment/Interventions  ADLs/Self Care Home Management;Electrical Stimulation;Gait training;Stair training;Functional mobility training;Therapeutic activities;Therapeutic exercise;Balance training;Neuromuscular re-education;Patient/family education;Orthotic Fit/Training;Passive range of motion;Dry needling;Taping    PT Next Visit Plan  Review - seated hip IR with knee extension; wall squats with adduction, squat walks with adduction; R SLS activities; descending/step downs (also with Bioness); Weight shift fully L for full step length RLE - needs to activate quad during terminal swing;  Bioness with  treadmill,  Stepping over obstacles, gait on pavement, balance reactions if foot catches.  Bioness and use with elliptical; sit > stand bringing weight forwards over foot; closed chain hip ABD strengthening, continue intense stretching program for RLE - gastroc, posterior tib, hamstring, hip ER, hip flexor.  RLE strengthening - RLE PNF patterns into IR/extension; especially hip EXT and ABD/IR  Balance training - rockerboard.    Consulted and Agree with Plan of Care  Patient       Patient will benefit from skilled therapeutic intervention in order to improve the following deficits and impairments:  Abnormal gait, Decreased balance, Decreased mobility,  Decreased range of motion, Decreased strength, Difficulty walking, Hypomobility, Increased muscle spasms, Impaired flexibility, Impaired tone, Impaired sensation  Visit Diagnosis: Unsteadiness on feet  Muscle weakness (generalized)  Stiffness of right ankle, not elsewhere classified  Repeated falls     Problem List Patient Active Problem List   Diagnosis Date Noted  . Depression 03/23/2017  . Anxiety 03/23/2017  . Muscle weakness 02/03/2017  . History of hemorrhagic stroke with residual hemiparesis ([place]) 12/10/2016  . Spastic hemiplegia and hemiparesis affecting dominant side ([place]) 09/13/2015  . Adhesive capsulitis of right shoulder 08/24/2015  . Localization-related symptomatic epilepsy and epileptic syndromes with simple partial seizures, not intractable, without status epilepticus ([place]) 08/21/2015  . Nontraumatic cortical hemorrhage of cerebral hemisphere ([place])   . Seizure ([place])   . Seizures ([place])   . Adjustment disorder with depressed mood   . Contracture of muscle ankle and foot 06/11/2015  . Hemiplga fol ntrm intcrbl hemor aff right dominant side ([place]) 06/06/2015  . Left-sided intracerebral hemorrhage ([place]) 06/04/2015  . Seizure disorder as sequela of cerebrovascular accident ([place])   . Seizure disorder ([place]) 06/03/2015  . Sepsis ([place]) 06/03/2015  . UTI (urinary tract infection) 06/03/2015  . Hypotension 06/02/2015  . Right spastic hemiparesis ([place]) 05/28/2015  . Aphasia following nontraumatic intracerebral hemorrhage 05/28/2015  . History of anxiety disorder 05/28/2015  . ICH (intracerebral hemorrhage) ([place])   . Seizure disorder, nonconvulsive, with status epilepticus ([place])   . Cerebral venous thrombosis of cortical vein   . Cytotoxic cerebral edema ([place])   . IVH (intraventricular hemorrhage) ([place])   . Term pregnancy 05/07/2015  . Spontaneous vaginal delivery 05/07/2015   [name], PT, DPT 08/02/19    3:03 PM  [facility] [address] [place], [place], [address] Phone: [phone]   Fax:  [phone]  Name: Ashley [name] MRN: [id number] Date of Birth: [birth year]

## 2019-08-03 ENCOUNTER — Ambulatory Visit: Payer: Medicare Other | Admitting: Physical Therapy

## 2019-08-08 DIAGNOSIS — Z0000 Encounter for general adult medical examination without abnormal findings: Secondary | ICD-10-CM | POA: Diagnosis not present

## 2019-08-08 DIAGNOSIS — R569 Unspecified convulsions: Secondary | ICD-10-CM | POA: Diagnosis not present

## 2019-08-08 DIAGNOSIS — I69320 Aphasia following cerebral infarction: Secondary | ICD-10-CM | POA: Diagnosis not present

## 2019-08-08 DIAGNOSIS — Z23 Encounter for immunization: Secondary | ICD-10-CM | POA: Diagnosis not present

## 2019-08-08 DIAGNOSIS — I69351 Hemiplegia and hemiparesis following cerebral infarction affecting right dominant side: Secondary | ICD-10-CM | POA: Diagnosis not present

## 2019-08-08 DIAGNOSIS — Z20828 Contact with and (suspected) exposure to other viral communicable diseases: Secondary | ICD-10-CM | POA: Diagnosis not present

## 2019-08-08 DIAGNOSIS — K5909 Other constipation: Secondary | ICD-10-CM | POA: Diagnosis not present

## 2019-08-08 DIAGNOSIS — F324 Major depressive disorder, single episode, in partial remission: Secondary | ICD-10-CM | POA: Diagnosis not present

## 2019-08-09 ENCOUNTER — Encounter: Payer: Medicare Other | Attending: Physical Medicine & Rehabilitation | Admitting: Physical Medicine & Rehabilitation

## 2019-08-09 ENCOUNTER — Encounter: Payer: Self-pay | Admitting: Physical Medicine & Rehabilitation

## 2019-08-09 ENCOUNTER — Other Ambulatory Visit: Payer: Self-pay

## 2019-08-09 VITALS — BP 104/60 | HR 80 | Temp 97.5°F | Ht 63.0 in | Wt 128.0 lb

## 2019-08-09 DIAGNOSIS — G8110 Spastic hemiplegia affecting unspecified side: Secondary | ICD-10-CM | POA: Diagnosis not present

## 2019-08-09 NOTE — Patient Instructions (Signed)

## 2019-08-09 NOTE — Progress Notes (Signed)
Xeomin Injection for spasticity using needle EMG guidance  Dilution: 50 Units/ml Indication: Severe spasticity which interferes with ADL,mobility and/or  hygiene and is unresponsive to medication management and other conservative care Informed consent was obtained after describing risks and benefits of the procedure with the patient. This includes bleeding, bruising, infectio Post tib 75U Gastroc 150U FDL 100 FHL 75 All injections were done after obtaining appropriate EMG activity and after negative drawback for blood. The patient tolerated the procedure well. Post procedure instructions were given. A followup appointment was made.

## 2019-08-10 ENCOUNTER — Ambulatory Visit: Payer: Medicare Other | Admitting: Physical Therapy

## 2019-08-10 ENCOUNTER — Ambulatory Visit: Payer: Medicare Other | Admitting: Occupational Therapy

## 2019-08-10 ENCOUNTER — Encounter: Payer: Self-pay | Admitting: Occupational Therapy

## 2019-08-10 ENCOUNTER — Ambulatory Visit (INDEPENDENT_AMBULATORY_CARE_PROVIDER_SITE_OTHER): Payer: Medicare Other | Admitting: Neurology

## 2019-08-10 ENCOUNTER — Encounter: Payer: Self-pay | Admitting: Neurology

## 2019-08-10 VITALS — BP 103/57 | HR 67 | Ht 63.0 in | Wt 125.1 lb

## 2019-08-10 DIAGNOSIS — I69851 Hemiplegia and hemiparesis following other cerebrovascular disease affecting right dominant side: Secondary | ICD-10-CM | POA: Diagnosis not present

## 2019-08-10 DIAGNOSIS — R413 Other amnesia: Secondary | ICD-10-CM | POA: Diagnosis not present

## 2019-08-10 DIAGNOSIS — M25611 Stiffness of right shoulder, not elsewhere classified: Secondary | ICD-10-CM

## 2019-08-10 DIAGNOSIS — I69359 Hemiplegia and hemiparesis following cerebral infarction affecting unspecified side: Secondary | ICD-10-CM | POA: Diagnosis not present

## 2019-08-10 DIAGNOSIS — R2681 Unsteadiness on feet: Secondary | ICD-10-CM

## 2019-08-10 DIAGNOSIS — R296 Repeated falls: Secondary | ICD-10-CM | POA: Diagnosis not present

## 2019-08-10 DIAGNOSIS — M25671 Stiffness of right ankle, not elsewhere classified: Secondary | ICD-10-CM

## 2019-08-10 DIAGNOSIS — G40109 Localization-related (focal) (partial) symptomatic epilepsy and epileptic syndromes with simple partial seizures, not intractable, without status epilepticus: Secondary | ICD-10-CM | POA: Diagnosis not present

## 2019-08-10 DIAGNOSIS — R293 Abnormal posture: Secondary | ICD-10-CM

## 2019-08-10 DIAGNOSIS — M6281 Muscle weakness (generalized): Secondary | ICD-10-CM | POA: Diagnosis not present

## 2019-08-10 NOTE — Therapy (Signed)
[facility] [address] [place], [place], [address] Phone: [phone]   Fax:  [phone]  Occupational Therapy Treatment  Patient Details  Name: Grisell Harp MRN: [id number] Date of Birth: 08-Dec-1975 Referring Provider (OT): Dr Marton Redwood   Encounter Date: 08/10/2019  OT End of Session - 08/10/19 1845    Visit Number  15    Number of Visits  16    Date for OT Re-Evaluation  08/17/19    Authorization Type  medicare with BCBS - will need PN every 10th visit    Authorization - Visit Number  15    Authorization - Number of Visits  20    OT Start Time  [id number]    OT Stop Time  1415    OT Time Calculation (min)  49 min    Activity Tolerance  Patient tolerated treatment well       Past Medical History:  Diagnosis Date  . Anxiety   . Depression   . ICH (intracerebral hemorrhage) ([place])   . Seizures ([place])   . Stroke [facility])     Past Surgical History:  Procedure Laterality Date  . DILATION AND CURETTAGE OF UTERUS      There were no vitals filed for this visit.  Subjective Assessment - 08/10/19 1842    Subjective   I think we can join a year round pool    Pertinent History  see epic snapshot; pt with SAH 2 weeks after deliverying her 3rd child    Patient Stated Goals  strength of arm and get my R leg working better    Currently in Pain?  No/denies       Patient seen for aquatic therapy today.  Treatment took place in water 2.5-4 feet deep depending upon activity.  Pt entered the pool via steps using 2 hand railings and mod I.  Pt able to self monitor postural alignment when entering pool.  Initiated begin instruction for aquatic HEP - pt reports she feels she and her husband can join a year round pool (currently belong to summer pool only). Discussed with pt benefits of regular aquatic HEP and pt verbalized understanding.  Focused on open water functional ambulation using air cast to stabilize R ankle - recommended  pt obtain one to use in the water. Pt able to use light dumb bells positioned closed to hips to provide visual feedback related to postural alignment with only min cues.  Addressed transitional movements from standing to supine to standing to prone with emphasis on pt full activating RLE and R side of trunk.  Also addressed activities in supine and prone to address core control and postural alignment allowing water to provide increased feedback due to buoyancy.  Addressed using BUE's for pulls in supine - pt able to complete 2 laps of pool with minimal intermittent guiding at feet to maintain straight course without flotation devices.  Also addressed prone with BUE's pulls - pt needs min a in this position for core control using small kick board ( 2 laps).  Addressed instructing pt strategies in the water to maintain gains she has made with R ankle ROM as well as continue to strengthening active movement using sit to stand from low step, standing with weight shift to R while maintaining good postural alignment, stretches over edge of step and heel ups - all done with air cast on to protect R ankle - pt able to return demonstrate after practice and instruction.  Also addressed  isolated LE coordination and strengthening in supine using flotation devices in patterns of hip extension with flexed knee with assist to stabilize at pelvis. Pt exited the pool via steps and 2 hand railings mod I.                       OT Short Term Goals - 08/10/19 1842      OT SHORT TERM GOAL #1   Title  Pt will demonstrate improved overhead reach to at least 130* of shoulder flexion RUE  to assist with heavier overhead home mgmt tasks -7/8//2020 (goal date adjusted to meet 4 visits)    Status  Achieved      OT SHORT TERM GOAL #2   Title  Pt will demonstrate improved RUE strength /core strength to assist with lifting heavy items in grocery store with greater ease (per pt report)    Status  Achieved      OT  SHORT TERM GOAL #3   Title  Pt will demonstrate ability to lift and carry a 5 pound object in the grocery store with her RUE with no LOB - 06/22/2019 (date adjusted due to hold first two weeks in August due to scheduling)    Status  Achieved        OT Long Term Goals - 08/10/19 1842      OT LONG TERM GOAL #1   Title  Pt will be mod I with upgraded HEP to include aquatic based HEP - 08/17/2019 (date adjusted as pt missed 3 weeks of therapy due to scheduling)    Status  On-going      OT LONG TERM GOAL #2   Title  Pt will demonstrate improved postural alignment and control to reduce falls (has been falling at least 1x/month)    Status  On-going            Plan - 08/10/19 1843    Clinical Impression Statement  Pt with continued improvement in ability to self monitor postural alignment with functional mobility as well as overhead reach wtih RUE.    OT Occupational Profile and History  Problem Focused Assessment - Including review of records relating to presenting problem    Occupational performance deficits (Please refer to evaluation for details):  IADL's;Leisure;Social Participation    Body Structure / Function / Physical Skills  Balance;Mobility;UE functional use;Strength;IADL;Tone;ROM;GMC    Rehab Potential  Good    Clinical Decision Making  Limited treatment options, no task modification necessary    Comorbidities Affecting Occupational Performance:  May have comorbidities impacting occupational performance    Modification or Assistance to Complete Evaluation   No modification of tasks or assist necessary to complete eval    OT Frequency  1x / week    OT Duration  8 weeks    OT Treatment/Interventions  Aquatic Therapy;Self-care/ADL training;Neuromuscular education;Therapeutic exercise;Functional Mobility Training;Balance training    Plan  aquatic therapy focused on instruction for aquatic HEP then d/c pt after next visit    Consulted and Agree with Plan of Care  Patient        Patient will benefit from skilled therapeutic intervention in order to improve the following deficits and impairments:   Body Structure / Function / Physical Skills: Balance, Mobility, UE functional use, Strength, IADL, Tone, ROM, GMC       Visit Diagnosis: Unsteadiness on feet  Muscle weakness (generalized)  Spastic hemiplegia of right dominant side as late effect of other cerebrovascular disease (HCC)  Abnormal  posture  Stiffness of right shoulder, not elsewhere classified    Problem List Patient Active Problem List   Diagnosis Date Noted  . Depression 03/23/2017  . Anxiety 03/23/2017  . Muscle weakness 02/03/2017  . History of hemorrhagic stroke with residual hemiparesis ([place]) 12/10/2016  . Spastic hemiplegia and hemiparesis affecting dominant side ([place]) 09/13/2015  . Adhesive capsulitis of right shoulder 08/24/2015  . Localization-related symptomatic epilepsy and epileptic syndromes with simple partial seizures, not intractable, without status epilepticus ([place]) 08/21/2015  . Nontraumatic cortical hemorrhage of cerebral hemisphere ([place])   . Seizure ([place])   . Seizures ([place])   . Adjustment disorder with depressed mood   . Contracture of muscle ankle and foot 06/11/2015  . Hemiplga fol ntrm intcrbl hemor aff right dominant side ([place]) 06/06/2015  . Left-sided intracerebral hemorrhage ([place]) 06/04/2015  . Seizure disorder as sequela of cerebrovascular accident ([place])   . Seizure disorder ([place]) 06/03/2015  . Sepsis ([place]) 06/03/2015  . UTI (urinary tract infection) 06/03/2015  . Hypotension 06/02/2015  . Right spastic hemiparesis ([place]) 05/28/2015  . Aphasia following nontraumatic intracerebral hemorrhage 05/28/2015  . History of anxiety disorder 05/28/2015  . ICH (intracerebral hemorrhage) ([place])   . Seizure disorder, nonconvulsive, with status epilepticus ([place])   . Cerebral venous thrombosis of cortical vein   . Cytotoxic cerebral edema ([place])   . IVH (intraventricular  hemorrhage) ([place])   . Term pregnancy 05/07/2015  . Spontaneous vaginal delivery 05/07/2015    Quay Burow, OTR/L 08/10/2019, 6:46 PM  [facility] [address] [place], [place], [address] Phone: [phone]   Fax:  [phone]  Name: Ladoris Hilby MRN: [id number] Date of Birth: 1976-04-15

## 2019-08-10 NOTE — Progress Notes (Signed)
NEUROLOGY FOLLOW UP OFFICE NOTE  Tamikka Mcmaster RN:[id number] 1940/05/14  HISTORY OF PRESENT ILLNESS: I had the pleasure of seeing Ashley [name] in follow-up in the neurology clinic on 08/10/2019. She was last seen 3 months ago after she had a significant increase in seizures that appeared to occur in the setting of Lamotrigine toxicity, inadvertently taking 400mg  BID instead of prescribed 200mg  BID. She has been back on Lamotrigine 200mg  BID since July with no further seizures. Her main concern today is her memory. She has become more forgetful but denies missing medications or getting lost driving. Her husband manages finances. She denies any headaches, dizziness, vision changes, no falls. She sees a psychiatrist and therapist but feels that telehealth visits with her therapist are different.   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 She had focal seizures in 2016 with EEG showing left PLEDs and has been on Keppra 1500mg  BID since then with no further focal motor seizures, however she would have occasional episodes around once a month where she could not get her words out, lasting 3-4 minutes. Sometimes she would get hot and have to sit but her speech would not be affected. On 11/27/16, she started having the same speech difficulties where she was making paraphasic errors, but it continued for 30 minutes and they decided to go to the ER. In the ER, she was noted to be aphasic, then as she was having a head CT, she suddenly had right gaze and head deviation, right arm and leg flexion and shaking for around 3 minutes. She was given IV Ativan but her right leg continued to shake in a rhythmic fashion. She was given additional Keppra and started on Vimpat with no further seizures, back to baseline in a couple of hours. She initially had some difficulties  with the Vimpat 100mg  BID where she had hallucinations of hearing music or voices, but these have resolved over the past week. She denies any side effects on Keppra 1500mg  BID. She denies any dizziness, diplopia, gait instability. She has a right foot drop, denies any numbness/tingling on the right side but states it feels different that the other side. She denies any olfactory/gustatory hallucinations, myoclonic jerks, episodes of staring/unresponsiveness, gaps in time. She has not had any further episodes of paraphasic errors since her hospitalization. She had a mild diffuse headache for a couple of days but this has resolved. She denies any dizziness, dysarthria/dysphagia, neck/back pain, bowel/bladder dysfunction.  Records from her hospitalization in August 2016 were reviewed. She has been seeing neurologist Dr. Leonie Man for post-stroke and seizure care since then, last visit was in August 2017. She had a severe headache for several days in August 2016 2 weeks post-partum. She then developed right-sided weakness, aphasia, then a focal seizure in the hospital. She was found to have a large left parietal ICH with IVH, cerebral edema, obstructive hydrocephalus and subarachnoid hemorrhage. MRA and MRV were unremarkable. Concern was for cerebral cortical vein thrombosis not seen on MRV, however RCVS was also a consideration. She was treated with hypertonic saline and stabilized. She had a seizure with EEG showing left PLEDs. She was started on Keppra. Hypercoagulable labs were normal. She was discharged to rehab and had another seizure on 06/01/16 with report of eyes rolling back and upper extremities shaking. Keppra dose was increased. She had a repeat MRI  brain in September 2016 with resolving subacute left parietal hematoma. There was acute gyral swelling and T2 hyperintensity in the left hippocampal formation, left insula, and operculum, that resolved with repeat MRI 09/2015 showing resolution of these changes.  She was initially having some headaches and low dose Topamax was briefly added, then with resolution of headaches, this was tapered off. She had been dealing with post-stroke depression and symptoms had improved, however with limitations on driving again, she is starting to feel depressed again. She has three young children, the youngest is 68 months old. She was not happy with neuro rehab and has a private physical therapist coming weekly where she pushes herself and has made a ton of improvement. She does not like to wear her AFO and her husband did not notice any difference in gait with the AFO.   Epilepsy Risk Factors:  History of large left parietal hematoma with encephalomalacia. Otherwise she had a normal birth and early development.  There is no history of febrile convulsions, CNS infections such as meningitis/encephalitis, significant traumatic brain injury, neurosurgical procedures, or family history of seizures.  Diagnostic Data: I personally reviewed MRI brain 11/26/2016 which did not show any acute changes. There was left paramedian parietal encephalomalacia with extensive hemosiderin staining extending into the splenium of the corpus callosum, ex vacuo dilatation of the left lateral ventricle occipital horn, increased side of the temporal horn of the left lateral ventricle which may be due to decreased volume of the hippocampus. It is smaller in size with increased signal compared to the right. EEG 11/27/16 showed mild left hemispheric slowing EEG 05/22/15: left hemisphere slowing, rare epileptiform discharges over the left anterior temporal region EEG 05/21/15: PLEDs on the left hemisphere, mainly involving the temporo-parietal region  Prior AEDs: Topamax, Vimpat, oxcarbazepine  PAST MEDICAL HISTORY: Past Medical History:  Diagnosis Date  . Anxiety   . Depression   . ICH (intracerebral hemorrhage) ([place])   . Seizures ([place])   . Stroke [facility])     MEDICATIONS: Current Outpatient  Medications on File Prior to Visit  Medication Sig Dispense Refill  . aspirin 81 MG chewable tablet Chew 81 mg by mouth daily.    . Cholecalciferol (VITAMIN D) 2000 units CAPS Take 2,000 Units by mouth daily.    Marland Kitchen FLUoxetine (PROZAC) 20 MG capsule Take 1 capsule (20 mg total) by mouth daily. Take with 40 mg for total of 60 mg daily 90 capsule 1  . FLUoxetine (PROZAC) 40 MG capsule Take 1 capsule (40 mg total) by mouth daily. 90 capsule 0  . lamoTRIgine (LAMICTAL) 200 MG tablet Take 1 tablet (200 mg total) by mouth 2 (two) times daily. Take 200 mg BID  180 tablet 3  . levETIRAcetam (KEPPRA) 750 MG tablet Take 2 tablets (1,500 mg total) by mouth 2 (two) times daily. 360 tablet 3  . LORazepam (ATIVAN) 1 MG tablet Take 1 tablet as needed for seizures (Patient taking differently: Take 1-3 mg by mouth daily as needed for seizure. ) 10 tablet 4  . magnesium oxide (MAG-OX) 400 MG tablet Take 400 mg by mouth 2 (two) times daily.    . mirtazapine (REMERON) 30 MG tablet Take 1 tablet (30 mg total) by mouth at bedtime. 90 tablet 0  . Multiple Vitamin (MULTIVITAMIN) tablet Take 1 tablet by mouth daily.     Marland Kitchen Plecanatide (TRULANCE) 3 MG TABS Take 3 mg by mouth daily.      No current facility-administered medications on file prior to visit.  ALLERGIES: No Known Allergies  FAMILY HISTORY: Family History  Problem Relation Age of Onset  . Cancer Mother   . Cancer Father        pancreatic    SOCIAL HISTORY: Social History   Socioeconomic History  . Marital status: Married    Spouse name: Not on file  . Number of children: Not on file  . Years of education: Not on file  . Highest education level: Not on file  Occupational History  . Not on file  Social Needs  . Financial resource strain: Not on file  . Food insecurity    Worry: Not on file    Inability: Not on file  . Transportation needs    Medical: Not on file    Non-medical: Not on file  Tobacco Use  . Smoking status: Never Smoker   . Smokeless tobacco: Never Used  Substance and Sexual Activity  . Alcohol use: Yes    Alcohol/week: 1.0 standard drinks    Types: 1 Glasses of wine per week    Comment: casual   . Drug use: No  . Sexual activity: Yes    Birth control/protection: None    Comment: husband has vasectomy  Lifestyle  . Physical activity    Days per week: Not on file    Minutes per session: Not on file  . Stress: Not on file  Relationships  . Social Herbalist on phone: Not on file    Gets together: Not on file    Attends religious service: Not on file    Active member of club or organization: Not on file    Attends meetings of clubs or organizations: Not on file    Relationship status: Not on file  . Intimate partner violence    Fear of current or ex partner: Not on file    Emotionally abused: Not on file    Physically abused: Not on file    Forced sexual activity: Not on file  Other Topics Concern  . Not on file  Social History Narrative   Lives with husband and three kids in two story home      Right handed      Highest level of edu- bachelors    REVIEW OF SYSTEMS: Constitutional: No fevers, chills, or sweats, no generalized fatigue, change in appetite Eyes: No visual changes, double vision, eye pain Ear, nose and throat: No hearing loss, ear pain, nasal congestion, sore throat Cardiovascular: No chest pain, palpitations Respiratory:  No shortness of breath at rest or with exertion, wheezes GastrointestinaI: No nausea, vomiting, diarrhea, abdominal pain, fecal incontinence Genitourinary:  No dysuria, urinary retention or frequency Musculoskeletal:  No neck pain, +back pain Integumentary: No rash, pruritus, skin lesions Neurological: as above Psychiatric: + depression, no insomnia,+ anxiety Endocrine: No palpitations, fatigue, diaphoresis, mood swings, change in appetite, change in weight, increased thirst Hematologic/Lymphatic:  No anemia, purpura,  petechiae. Allergic/Immunologic: no itchy/runny eyes, nasal congestion, recent allergic reactions, rashes  PHYSICAL EXAM: Vitals:   08/10/19 1615  BP: (!) 103/57  Pulse: 67  SpO2: 99%   General: No acute distress, tearful Head:  Normocephalic/atraumatic Skin/Extremities: No rash, no edema, wearing right AFO Neurological Exam: alert and oriented to person, place, and time. No aphasia or dysarthria. Fund of knowledge is appropriate.  Recent and remote memory are intact.  Attention and concentration are normal.    Able to name objects and repeat phrases. are normal.    [place] Mental Exam 08/10/2019  Weekday Correct 1  Current year 1  What state are we in? 1  Amount spent 1  Amount left 2  # of Animals 3  5 objects recall 3  Number series 2  Hour markers 2  Time correct 1  Placed X in triangle correctly 1  Largest Figure 1  Name of female 2  Date back to work 0  Type of work 2  State she lived in 0  Total score 23    Cranial nerves: CN I: not tested CN II: pupils equal, round and reactive to light, visual fields intact CN III, IV, VI:  full range of motion, no nystagmus CN VII: upper and lower face symmetric CN VIII: hearing intact to conversation Bulk & Tone: increased tone on right LE (more on right ankle), no fasciculations. Motor: 0/5 right foot dorsiflexion, eversion/inversion, 2/5 plantarflexion. Otherwise 5/5 throughout with no pronator drift (unchanged) Cerebellar: no incoordination on finger to nose testing Gait: spastic hemiparetic gait with foot circumduction (unchanged) Tremor: none  IMPRESSION: This is a pleasant 43 yo RH woman with a history of large left ICH and focal seizures in August 2016 with residual right foot drop. Her last GTC was in 06/2018. She had an increase in seizures last July that appeared to be in the setting of Lamotrigine toxicity, inadvertently taking 400mg  BID instead of 200mg  BID. She has been back on Lamotrigine 200mg  BID  since then with no further seizures. Her main concern today is memory loss. SLUMS score today 23/30. We discussed different causes of memory changes, check TSH and B12 if not done recently at PCP office. We discussed how mood can affect memory, she sees [employer] regularly. She will be scheduled for Neurocognitive testing to further evaluate cognitive concerns. She has been back to driving, and is aware of [place] driving laws to stop driving after an unprovoked seizure until 6 months seizure-free. She will follow-up in 6 months and knows to call for any changes.  Thank you for allowing me to participate in her care.  Please do not hesitate to call for any questions or concerns.    Ellouise Newer, M.D.   CC: Dr. Brigitte Pulse

## 2019-08-10 NOTE — Patient Instructions (Signed)
1. Bloodwork from Dr. Brigitte Pulse will be requested for review, if not done, we will order TSH and B12  2. Continue all your medications  3. Schedule Neurocognitive testing with Dr. Leslee Home have been referred for a neurocognitive evaluation in our office.   The evaluation has two parts.   . The first part of the evaluation is a clinical interview with the neuropsychologist (Dr. Melvyn Novas or Dr. Nicole Kindred). Please bring someone with you to this appointment if possible, as it is helpful for the doctor to hear from both you and another adult who knows you well.   . The second part of the evaluation is testing with the doctor's technician Hinton Dyer or Maudie Mercury). The testing includes a variety of tasks- mostly question-and-answer, some paper-and-pencil. There is nothing you need to do to prepare for this appointment, but having a good night's sleep prior to the testing, taking medications as you normally would, and bringing eyeglasses and hearing aids (if you wear them), is advised. Please make sure that you wear a mask to the appointment.  Please note: We have to reserve several hours of the neuropsychologist's time and the psychometrician's time for your evaluation appointment. As such, please note that there is a No-Show fee of $100. If you are unable to attend any of your appointments, please contact our office as soon as possible to reschedule.

## 2019-08-10 NOTE — Therapy (Signed)
[facility] [address] [place], [place], [address] Phone: [phone]   Fax:  [phone]  Physical Therapy Treatment  Patient Details  Name: Ashley [name] MRN: [id number] Date of Birth: Apr 02, 1976 Referring Provider (PT): Marton Redwood, MD   Encounter Date: 08/10/2019  PT End of Session - 08/10/19 1139    Visit Number  21    Number of Visits  24    Date for PT Re-Evaluation  09/06/19   delayed date due to lack of appointment times   Authorization Type  Medicare and BCBS - 10th visit PN required.  30 VL for OT; 30 VL for PT - 3 used    Authorization - Visit Number  24    Authorization - Number of Visits  30    PT Start Time  [id number]    PT Stop Time  1015    PT Time Calculation (min)  40 min    Activity Tolerance  Patient tolerated treatment well    Behavior During Therapy  WFL for tasks assessed/performed       Past Medical History:  Diagnosis Date  . Anxiety   . Depression   . ICH (intracerebral hemorrhage) ([place])   . Seizures ([place])   . Stroke [facility])     Past Surgical History:  Procedure Laterality Date  . DILATION AND CURETTAGE OF UTERUS      There were no vitals filed for this visit.  Subjective Assessment - 08/10/19 0938    Subjective  Pt's alarm didn't go off last session and missed session.  Weekend was full of soccer - no toe catching since addition of leather toe cap.  Had botox injections yesterday.    Pertinent History  : L CVA with ICH, R hemiplegia, contracture of R ankle muscles, R shoulder adhesive capsulitis, multiple falls with injury, seizures, depression and anxiety    Limitations  Standing;Walking    Patient Stated Goals  More stability, prevent falls, would like to be able to run    Currently in Pain?  No/denies                       [facility] Adult PT Treatment/Exercise - 08/10/19 0958      Ambulation/Gait   Ambulation/Gait  Yes    Ambulation/Gait Assistance  6:  Modified independent (Device/Increase time)    Ambulation/Gait Assistance Details  after exercises and NMR; pt demonstrated carryover of glute med and hip IR activation.  Pt demonstrated improved sequence during swing phase without use of ankle supination and hip ER to advance.  Demonstrated improved use of hip and knee flexion at initial swing and improved knee extension with heel strike at terminal swing.      Ambulation Distance (Feet)  230 Feet    Assistive device  None    Ambulation Surface  Level;Indoor      Exercises   Exercises  Other Exercises    Other Exercises   Tall kneeling on mat: coming down into squat with hip to the R and UE reaching across midline to L and then back up to midline.  Pt felt as if she was falling so had pt connect hip to therapist's hips and match therapist's movement x 10 reps.        Knee/Hip Exercises: Sidelying   Hip ABduction  Strengthening;Right;1 set;5 reps    Hip ABduction Limitations  R sidelying for closed chain hip ABD in modified side plank with knees bent and propping  on elbow    Clams  R reverse clams with knee squeezing yoga block x 10 reps adding in knee extension      Ankle Exercises: Stretches   Gastroc Stretch  5 reps;20 seconds    Gastroc Stretch Limitations  on Prostretch Rocker in standing rocking back with LE straight and RUE reaching across midline up and to the R x 5 reps.  Then performed squat with hips squatting back and to the R with therapist holding knee/hip into IR x 5 reps             PT Education - 08/10/19 1139    Education provided  Yes    Education Details  will stop by [employer] to allow them to replace velcro on T strap    Person(s) Educated  Patient    Methods  Explanation    Comprehension  Verbalized understanding       PT Short Term Goals - 07/08/19 1122      PT SHORT TERM GOAL #1   Title  = LTG        PT Long Term Goals - 07/08/19 1116      PT LONG TERM GOAL #1   Title  Pt will demonstrate  independence with final HEP and improved RLE mechanics on elliptical when using for aerobic exercise (date reflects delay in start of new visits; 4 week total POC)    Time  4    Period  Weeks    Status  New    Target Date  09/06/19      PT LONG TERM GOAL #2   Title  Pt will ambulate 1,000 outside over uneven terrain, negotiate curbs and over large obstacles MOD I with AFO and leather toe cap safely without catching R toe    Baseline  --    Time  4    Period  Weeks    Status  New    Target Date  09/06/19      PT LONG TERM GOAL #3   Title  Pt will demonstrate improvement in gait velocity with new AFO and leather toe cap to 4.34ft/sec    Baseline  3.8 with leather toe cap    Time  4    Period  Weeks    Status  Revised    Target Date  09/06/19      PT LONG TERM GOAL #4   Title  Pt will negotiate 12 stairs with one rail MOD I alternating sequence while maintaining R hip in neutral rotation due to improved weight shifting.    Baseline  --    Time  4    Period  Weeks    Status  New    Target Date  09/06/19      PT LONG TERM GOAL #5   Title  --    Time  --    Period  --    Status  --            Plan - 08/10/19 1140    Clinical Impression Statement  Focused mainly on stretching and increasing ankle and hip ROM following Botox injections and NMR to facilitate hip IR activation and glute med activation in standing, tall kneeling and sidelying.  Following NMR and exercises pt able to carryover glute med and hip IR activation during gait with AFO/T strap and demonstrated significant improvements in gait sequence.  Will continue to address in order to progress towards LTG.    Personal Factors and  Comorbidities  Comorbidity 2;Time since onset of injury/illness/exacerbation;Age    Comorbidities  L CVA with ICH, R hemiplegia, contracture of R ankle muscles, R shoulder adhesive capsulitis, multiple falls with injury, seizures, depression and anxiety, pt is a mother to young children     Examination-Activity Limitations  Locomotion Level;Squat;Stairs;Stand;Caring for Others    Examination-Participation Restrictions  Cleaning;Shop;Community Activity    Stability/Clinical Decision Making  Evolving/Moderate complexity    Rehab Potential  Good    Clinical Impairments Affecting Rehab Potential  time since onset, mm contracture in R ankle    PT Frequency  2x / week    PT Duration  4 weeks    PT Treatment/Interventions  ADLs/Self Care Home Management;Electrical Stimulation;Gait training;Stair training;Functional mobility training;Therapeutic activities;Therapeutic exercise;Balance training;Neuromuscular re-education;Patient/family education;Orthotic Fit/Training;Passive range of motion;Dry needling;Taping    PT Next Visit Plan  tall kneeling with squats to R, reach to L.  modified side plank.  Wall squats with block squeeze.  Next week begin to check goals and plan for D/C.    Consulted and Agree with Plan of Care  Patient       Patient will benefit from skilled therapeutic intervention in order to improve the following deficits and impairments:  Abnormal gait, Decreased balance, Decreased mobility, Decreased range of motion, Decreased strength, Difficulty walking, Hypomobility, Increased muscle spasms, Impaired flexibility, Impaired tone, Impaired sensation  Visit Diagnosis: Unsteadiness on feet  Muscle weakness (generalized)  Stiffness of right ankle, not elsewhere classified  Repeated falls  Spastic hemiplegia of right dominant side as late effect of other cerebrovascular disease ([place])     Problem List Patient Active Problem List   Diagnosis Date Noted  . Depression 03/23/2017  . Anxiety 03/23/2017  . Muscle weakness 02/03/2017  . History of hemorrhagic stroke with residual hemiparesis ([place]) 12/10/2016  . Spastic hemiplegia and hemiparesis affecting dominant side ([place]) 09/13/2015  . Adhesive capsulitis of right shoulder 08/24/2015  . Localization-related symptomatic  epilepsy and epileptic syndromes with simple partial seizures, not intractable, without status epilepticus ([place]) 08/21/2015  . Nontraumatic cortical hemorrhage of cerebral hemisphere ([place])   . Seizure ([place])   . Seizures ([place])   . Adjustment disorder with depressed mood   . Contracture of muscle ankle and foot 06/11/2015  . Hemiplga fol ntrm intcrbl hemor aff right dominant side ([place]) 06/06/2015  . Left-sided intracerebral hemorrhage ([place]) 06/04/2015  . Seizure disorder as sequela of cerebrovascular accident ([place])   . Seizure disorder ([place]) 06/03/2015  . Sepsis ([place]) 06/03/2015  . UTI (urinary tract infection) 06/03/2015  . Hypotension 06/02/2015  . Right spastic hemiparesis ([place]) 05/28/2015  . Aphasia following nontraumatic intracerebral hemorrhage 05/28/2015  . History of anxiety disorder 05/28/2015  . ICH (intracerebral hemorrhage) ([place])   . Seizure disorder, nonconvulsive, with status epilepticus ([place])   . Cerebral venous thrombosis of cortical vein   . Cytotoxic cerebral edema ([place])   . IVH (intraventricular hemorrhage) ([place])   . Term pregnancy 05/07/2015  . Spontaneous vaginal delivery 05/07/2015    [name], PT, DPT 08/10/19    11:48 AM    [facility] [address] [place], [place], [address] Phone: [phone]   Fax:  [phone]  Name: Ashley [name] MRN: [id number] Date of Birth: 1976-09-14

## 2019-08-11 ENCOUNTER — Ambulatory Visit: Payer: Medicare Other | Admitting: Physical Therapy

## 2019-08-11 DIAGNOSIS — Z79899 Other long term (current) drug therapy: Secondary | ICD-10-CM | POA: Diagnosis not present

## 2019-08-11 DIAGNOSIS — Z01419 Encounter for gynecological examination (general) (routine) without abnormal findings: Secondary | ICD-10-CM | POA: Diagnosis not present

## 2019-08-11 DIAGNOSIS — Z124 Encounter for screening for malignant neoplasm of cervix: Secondary | ICD-10-CM | POA: Diagnosis not present

## 2019-08-11 DIAGNOSIS — R569 Unspecified convulsions: Secondary | ICD-10-CM | POA: Diagnosis not present

## 2019-08-11 DIAGNOSIS — R203 Hyperesthesia: Secondary | ICD-10-CM | POA: Diagnosis not present

## 2019-08-11 DIAGNOSIS — Z1231 Encounter for screening mammogram for malignant neoplasm of breast: Secondary | ICD-10-CM | POA: Diagnosis not present

## 2019-08-17 ENCOUNTER — Other Ambulatory Visit: Payer: Self-pay

## 2019-08-17 ENCOUNTER — Encounter: Payer: Self-pay | Admitting: Occupational Therapy

## 2019-08-17 ENCOUNTER — Encounter: Payer: Self-pay | Admitting: Physical Therapy

## 2019-08-17 ENCOUNTER — Ambulatory Visit: Payer: Medicare Other | Attending: Physical Medicine & Rehabilitation | Admitting: Occupational Therapy

## 2019-08-17 ENCOUNTER — Ambulatory Visit: Payer: Medicare Other | Admitting: Physical Therapy

## 2019-08-17 DIAGNOSIS — M6281 Muscle weakness (generalized): Secondary | ICD-10-CM | POA: Diagnosis not present

## 2019-08-17 DIAGNOSIS — M25671 Stiffness of right ankle, not elsewhere classified: Secondary | ICD-10-CM | POA: Insufficient documentation

## 2019-08-17 DIAGNOSIS — I69851 Hemiplegia and hemiparesis following other cerebrovascular disease affecting right dominant side: Secondary | ICD-10-CM

## 2019-08-17 DIAGNOSIS — R293 Abnormal posture: Secondary | ICD-10-CM | POA: Diagnosis not present

## 2019-08-17 DIAGNOSIS — R2681 Unsteadiness on feet: Secondary | ICD-10-CM | POA: Insufficient documentation

## 2019-08-17 DIAGNOSIS — R296 Repeated falls: Secondary | ICD-10-CM

## 2019-08-17 DIAGNOSIS — M25611 Stiffness of right shoulder, not elsewhere classified: Secondary | ICD-10-CM | POA: Diagnosis not present

## 2019-08-17 NOTE — Therapy (Signed)
[facility] [address] [place], [place], [address] Phone: [phone]   Fax:  [phone]  Physical Therapy Treatment  Patient Details  Name: Ashley [name] MRN: [id number] Date of Birth: 24-Jan-1976 Referring Provider (PT): Marton Redwood, MD   Encounter Date: 08/17/2019  PT End of Session - 08/17/19 1356    Visit Number  22    Number of Visits  24    Date for PT Re-Evaluation  09/06/19   delayed date due to lack of appointment times   Authorization Type  Medicare and BCBS - 10th visit PN required.  30 VL for OT; 30 VL for PT - 3 used    Authorization - Visit Number  25    Authorization - Number of Visits  30    PT Start Time  6962    PT Stop Time  1017    PT Time Calculation (min)  42 min    Activity Tolerance  Patient tolerated treatment well    Behavior During Therapy  WFL for tasks assessed/performed       Past Medical History:  Diagnosis Date  . Anxiety   . Depression   . ICH (intracerebral hemorrhage) ([place])   . Seizures ([place])   . Stroke [facility])     Past Surgical History:  Procedure Laterality Date  . DILATION AND CURETTAGE OF UTERUS      There were no vitals filed for this visit.  Subjective Assessment - 08/17/19 0956    Subjective  Had a good session with Neurology, is going to go to Neur[facility] for cognitive testing.  Wearing old AFO - T strap came off of new AFO.    Pertinent History  : L CVA with ICH, R hemiplegia, contracture of R ankle muscles, R shoulder adhesive capsulitis, multiple falls with injury, seizures, depression and anxiety    Limitations  Standing;Walking    Patient Stated Goals  More stability, prevent falls, would like to be able to run         [facility]: Lambert Rhonda W PT Assessment - 08/17/19 1010      Ambulation/Gait   Ambulation/Gait  Yes    Ambulation/Gait Assistance  6: Modified independent (Device/Increase time)    Ambulation/Gait Assistance Details  with AFO with T strap.  No hesitation  when ambulating over uneven surfaces and no significant toe catching, mild toe drag noted on uneven incline.  Stepping over obstacles x 6 reps MOD I without stopping before stepping over    Ambulation Distance (Feet)  1000 Feet    Assistive device  None    Ambulation Surface  Unlevel;Outdoor;Paved    Stairs  Yes    Stairs Assistance  6: Modified independent (Device/Increase time)    Stairs Assistance Details (indicate cue type and reason)  Still demonstrating R hip ER when descending on RLE when going at natural pace.  If pt slows down and attends to RLE, demonstrated improved ability to maintain hip neutral rotation    Stair Management Technique  One rail Left;Alternating pattern;Forwards    Number of Stairs  12    Height of Stairs  6    Ramp  6: Modified independent (Device)    Curb  6: Modified independent (Device/increase time)      Standardized Balance Assessment   10 Meter Walk  8.12 seconds or 4.04 ft/sec                   [facility] Adult PT Treatment/Exercise - 08/17/19 1010  Ambulation/Gait   Ramp Details (indicate cue type and reason)  with AFO; no foot catching    Curb Details (indicate cue type and reason)  with AFO, did not have to stop prior to descending or ascending      Therapeutic Activites    Therapeutic Activities  Other Therapeutic Activities    Other Therapeutic Activities  attempted to re-attach T strap to new AFO - PT observed that T strap is attached with sticker only - recommended that if pt goes back to [facility] to address tip of leather toe cap peeling away from shoes, to have orthotist reinforce adhesive on T strap to prevent it from pulling away from AFO.  Required two attempts to find correct placement of T strap for proper positioning.               PT Education - 08/17/19 1017    Education provided  Yes    Education Details  Replaced T strap but recommended pt go by [facility] to have T strap reinforced and fix front of leather toe cap.   Plan for final session on Friday.    Person(s) Educated  Patient    Methods  Explanation    Comprehension  Verbalized understanding       PT Short Term Goals - 07/08/19 1122      PT SHORT TERM GOAL #1   Title  = LTG        PT Long Term Goals - 08/17/19 0958      PT LONG TERM GOAL #1   Title  Pt will demonstrate independence with final HEP and improved RLE mechanics on elliptical when using for aerobic exercise (date reflects delay in start of new visits; 4 week total POC)    Time  4    Period  Weeks    Status  On-going      PT LONG TERM GOAL #2   Title  Pt will ambulate 1,000 outside over uneven terrain, negotiate curbs and over large obstacles MOD I with AFO and leather toe cap safely without catching R toe    Time  4    Period  Weeks    Status  Achieved      PT LONG TERM GOAL #3   Title  Pt will demonstrate improvement in gait velocity with new AFO and leather toe cap to 4.37f/sec    Baseline  3.8 with leather toe cap    Time  4    Period  Weeks    Status  Achieved      PT LONG TERM GOAL #4   Title  Pt will negotiate 12 stairs with one rail MOD I alternating sequence while maintaining R hip in neutral rotation due to improved weight shifting.    Time  4    Period  Weeks    Status  Partially Met            Plan - 08/17/19 1357    Clinical Impression Statement  Following adjustment and replacement of T strap so pt could wear more current AFO initiated assessment of progress towards LTG.  Pt has made good progress and has met 2 LTG demonstrating increased gait speed and improved safety when ambulating outside over uneven surfaces with decreased episodes of foot catching and toe drag.  Pt continues to experience increased R hip ER when descending stairs unless she is descending very slowly and with increased attention to R hip position.  Will complete final goal on Friday with  plan to D/C from PT.    Personal Factors and Comorbidities  Comorbidity 2;Time since onset  of injury/illness/exacerbation;Age    Comorbidities  L CVA with ICH, R hemiplegia, contracture of R ankle muscles, R shoulder adhesive capsulitis, multiple falls with injury, seizures, depression and anxiety, pt is a mother to young children    Examination-Activity Limitations  Locomotion Level;Squat;Stairs;Stand;Caring for Others    Examination-Participation Restrictions  Cleaning;Shop;Community Activity    Stability/Clinical Decision Making  Evolving/Moderate complexity    Rehab Potential  Good    Clinical Impairments Affecting Rehab Potential  time since onset, mm contracture in R ankle    PT Frequency  2x / week    PT Duration  4 weeks    PT Treatment/Interventions  ADLs/Self Care Home Management;Electrical Stimulation;Gait training;Stair training;Functional mobility training;Therapeutic activities;Therapeutic exercise;Balance training;Neuromuscular re-education;Patient/family education;Orthotic Fit/Training;Passive range of motion;Dry needling;Taping    PT Next Visit Plan  Finalize HEP; D/C tall kneeling with squats to R, reach to L.  modified side plank.  Wall squats with block squeeze.    Consulted and Agree with Plan of Care  Patient       Patient will benefit from skilled therapeutic intervention in order to improve the following deficits and impairments:  Abnormal gait, Decreased balance, Decreased mobility, Decreased range of motion, Decreased strength, Difficulty walking, Hypomobility, Increased muscle spasms, Impaired flexibility, Impaired tone, Impaired sensation  Visit Diagnosis: Unsteadiness on feet  Muscle weakness (generalized)  Stiffness of right ankle, not elsewhere classified  Repeated falls  Spastic hemiplegia of right dominant side as late effect of other cerebrovascular disease ([place])     Problem List Patient Active Problem List   Diagnosis Date Noted  . Depression 03/23/2017  . Anxiety 03/23/2017  . Muscle weakness 02/03/2017  . History of hemorrhagic  stroke with residual hemiparesis ([place]) 12/10/2016  . Spastic hemiplegia and hemiparesis affecting dominant side ([place]) 09/13/2015  . Adhesive capsulitis of right shoulder 08/24/2015  . Localization-related symptomatic epilepsy and epileptic syndromes with simple partial seizures, not intractable, without status epilepticus ([place]) 08/21/2015  . Nontraumatic cortical hemorrhage of cerebral hemisphere ([place])   . Seizure ([place])   . Seizures ([place])   . Adjustment disorder with depressed mood   . Contracture of muscle ankle and foot 06/11/2015  . Hemiplga fol ntrm intcrbl hemor aff right dominant side ([place]) 06/06/2015  . Left-sided intracerebral hemorrhage ([place]) 06/04/2015  . Seizure disorder as sequela of cerebrovascular accident ([place])   . Seizure disorder ([place]) 06/03/2015  . Sepsis ([place]) 06/03/2015  . UTI (urinary tract infection) 06/03/2015  . Hypotension 06/02/2015  . Right spastic hemiparesis ([place]) 05/28/2015  . Aphasia following nontraumatic intracerebral hemorrhage 05/28/2015  . History of anxiety disorder 05/28/2015  . ICH (intracerebral hemorrhage) ([place])   . Seizure disorder, nonconvulsive, with status epilepticus ([place])   . Cerebral venous thrombosis of cortical vein   . Cytotoxic cerebral edema ([place])   . IVH (intraventricular hemorrhage) ([place])   . Term pregnancy 05/07/2015  . Spontaneous vaginal delivery 05/07/2015    [name], PT, DPT 08/17/19    2:02 PM    [facility] [address] [place], [place], [address] Phone: [phone]   Fax:  [phone]  Name: Tahisha Hakim MRN: [id number] Date of Birth: 11/19/1975

## 2019-08-17 NOTE — Therapy (Signed)
[facility] [address] [place], [place], [address] Phone: [phone]   Fax:  [phone]  Occupational Therapy Treatment  Patient Details  Name: Ashley [name] MRN: [id number] Date of Birth: 1976-05-31 Referring Provider (OT): Dr Marton Redwood   Encounter Date: 08/17/2019  OT End of Session - 08/17/19 1801    Visit Number  16    Number of Visits  20    Date for OT Re-Evaluation  09/14/19    Authorization Type  medicare with BCBS - will need PN every 10th visit    Authorization - Visit Number  65    Authorization - Number of Visits  20    OT Start Time  1325    OT Stop Time  1414    OT Time Calculation (min)  49 min    Activity Tolerance  Patient tolerated treatment well       Past Medical History:  Diagnosis Date  . Anxiety   . Depression   . ICH (intracerebral hemorrhage) ([place])   . Seizures ([place])   . Stroke [facility])     Past Surgical History:  Procedure Laterality Date  . DILATION AND CURETTAGE OF UTERUS      There were no vitals filed for this visit.  Subjective Assessment - 08/17/19 1753    Subjective   I am not going to be ablet to remember all this (related to aquatic HEP)    Pertinent History  see epic snapshot; pt with [facility] 2 weeks after deliverying her 3rd child    Patient Stated Goals  strength of arm and get my R leg working better    Currently in Pain?  No/denies         Patient seen for aquatic therapy today.  Treatment took place in water 2.5-4 feet deep depending upon activity.  Pt entered the pool via steps using 2 hand railings and mod I.  Pt demonstrating good postural alignment and control without cues.  Treatment focused on initiation of education for aquatic HEP as pt has decided to join [employer].  Focused on functional ambulation in open water using light dumb bells in water for UE guidance in order to provide visual feedback (given profound sensory lose on R side) to pt on postural  alignment and control of trunk and pelvis - pt able to utilize with only 2 cues for 4 lengths of pool.  Also addressed stretching activities that pt is currently only able to do in water for R ankle including heels over edge of step, and weight shifting to R with foot flat on step.  Emphasized need for pt to constantly be aware of postural alignment and to avoid allowing R hip to retract or for pt to posture in hip flexion with stretches. Pt verbalized and demonstrated understanding. Strongly recommend that pt use air cast during activities in order to protect R ankle - pt has purchased on for use in the water and demonstrated donning and doffing.  Emphasized need to be in knee depth water during lateral weight shifting and at least hip depth during heels over edge to step.  Also instructed pt in heel up activity to strengthening plantar flexion using air cast. Pt is able to complete this in chest level water to allow buoyancy to assist activity.  Addressed hip abduction and hip extension exercises in water using ankle flotation (pt wishes to order one) for simple resistance with standing on bottom step and using BUE support on railing and  allowing R foot to hang over edge of step to allow pt to avoid catching R foot on bottom of pool. Pt needed significant cueing (verbal and tactile) due to apraxia, sensory loss and memory issues however with practice was able to complete with only minimal cueing.    Feel pt will need additional sessions to complete and reinforce education for aquatic HEP as pt has decided to now join [employer] due to sensory issues, apraxia and memory issues. Will also provide education in writing at discharge. Anticipate that education could be completed in 2 more sessions. Pt in agreement with plan.     OT Education - 08/17/19 1754    Education Details  initiated education for aquatic HEP as pt plans to join [employer]) Educated  Patient    Methods  Explanation;Demonstration     Comprehension  Verbalized understanding;Need further instruction;Returned demonstration   returned demonstration with cues and assist      OT Short Term Goals - 08/17/19 1755      OT SHORT TERM GOAL #1   Title  Pt will demonstrate improved overhead reach to at least 130* of shoulder flexion RUE  to assist with heavier overhead home mgmt tasks -7/8//2020 (goal date adjusted to meet 4 visits)    Status  Achieved      OT SHORT TERM GOAL #2   Title  Pt will demonstrate improved RUE strength /core strength to assist with lifting heavy items in grocery store with greater ease (per pt report)    Status  Achieved      OT SHORT TERM GOAL #3   Title  Pt will demonstrate ability to lift and carry a 5 pound object in the grocery store with her RUE with no LOB - 06/22/2019 (date adjusted due to hold first two weeks in August due to scheduling)    Status  Achieved        OT Long Term Goals - 08/17/19 1755      OT LONG TERM GOAL #1   Title  Pt will be mod I with upgraded HEP to include aquatic based HEP -09/14/2019 (renewal on 08/17/2019)    Status  On-going      OT LONG TERM GOAL #2   Title  Pt will demonstrate improved postural alignment and control to reduce falls (has been falling at least 1x/month)    Status  Achieved   pt reports she has not fallen in almost three months           Plan - 08/17/19 1757    Clinical Impression Statement  Pt has made excellent progress toward goals. Given memory deficits, apraxia and sensory issues pt will need additional sessions to complete education for aquatic HEP as pt has decided to join [place] and History  Problem Focused Assessment - Including review of records relating to presenting problem    Occupational performance deficits (Please refer to evaluation for details):  IADL's;Leisure;Social Participation    Body Structure / Function / Physical Skills  Balance;Mobility;UE functional use;Strength;IADL;Tone;ROM;GMC    Rehab  Potential  Good    Clinical Decision Making  Limited treatment options, no task modification necessary    Comorbidities Affecting Occupational Performance:  May have comorbidities impacting occupational performance    Modification or Assistance to Complete Evaluation   No modification of tasks or assist necessary to complete eval    OT Frequency  1x / week    OT Duration  4  weeks    OT Treatment/Interventions  Aquatic Therapy;Self-care/ADL training;Neuromuscular education;Therapeutic exercise;Functional Mobility Training;Balance training    Plan  focus on pt education for aquatic HEP    Consulted and Agree with Plan of Care  Patient       Patient will benefit from skilled therapeutic intervention in order to improve the following deficits and impairments:   Body Structure / Function / Physical Skills: Balance, Mobility, UE functional use, Strength, IADL, Tone, ROM, GMC       Visit Diagnosis: Unsteadiness on feet  Muscle weakness (generalized)  Abnormal posture  Stiffness of right shoulder, not elsewhere classified    Problem List Patient Active Problem List   Diagnosis Date Noted  . Depression 03/23/2017  . Anxiety 03/23/2017  . Muscle weakness 02/03/2017  . History of hemorrhagic stroke with residual hemiparesis ([place]) 12/10/2016  . Spastic hemiplegia and hemiparesis affecting dominant side ([place]) 09/13/2015  . Adhesive capsulitis of right shoulder 08/24/2015  . Localization-related symptomatic epilepsy and epileptic syndromes with simple partial seizures, not intractable, without status epilepticus ([place]) 08/21/2015  . Nontraumatic cortical hemorrhage of cerebral hemisphere ([place])   . Seizure ([place])   . Seizures ([place])   . Adjustment disorder with depressed mood   . Contracture of muscle ankle and foot 06/11/2015  . Hemiplga fol ntrm intcrbl hemor aff right dominant side ([place]) 06/06/2015  . Left-sided intracerebral hemorrhage ([place]) 06/04/2015  . Seizure disorder as sequela  of cerebrovascular accident ([place])   . Seizure disorder ([place]) 06/03/2015  . Sepsis ([place]) 06/03/2015  . UTI (urinary tract infection) 06/03/2015  . Hypotension 06/02/2015  . Right spastic hemiparesis ([place]) 05/28/2015  . Aphasia following nontraumatic intracerebral hemorrhage 05/28/2015  . History of anxiety disorder 05/28/2015  . ICH (intracerebral hemorrhage) ([place])   . Seizure disorder, nonconvulsive, with status epilepticus ([place])   . Cerebral venous thrombosis of cortical vein   . Cytotoxic cerebral edema ([place])   . IVH (intraventricular hemorrhage) ([place])   . Term pregnancy 05/07/2015  . Spontaneous vaginal delivery 05/07/2015    Quay Burow, OTR/L 08/17/2019, 6:03 PM  [facility] [address] [place], [place], [address] Phone: [phone]   Fax:  [phone]  Name: Jekia Amer MRN: [id number] Date of Birth: [birth year]

## 2019-08-19 ENCOUNTER — Encounter: Payer: Self-pay | Admitting: Physical Therapy

## 2019-08-19 ENCOUNTER — Other Ambulatory Visit: Payer: Self-pay

## 2019-08-19 ENCOUNTER — Ambulatory Visit: Payer: Medicare Other | Admitting: Physical Therapy

## 2019-08-19 DIAGNOSIS — R296 Repeated falls: Secondary | ICD-10-CM | POA: Diagnosis not present

## 2019-08-19 DIAGNOSIS — M25671 Stiffness of right ankle, not elsewhere classified: Secondary | ICD-10-CM

## 2019-08-19 DIAGNOSIS — I69851 Hemiplegia and hemiparesis following other cerebrovascular disease affecting right dominant side: Secondary | ICD-10-CM

## 2019-08-19 DIAGNOSIS — R2681 Unsteadiness on feet: Secondary | ICD-10-CM | POA: Diagnosis not present

## 2019-08-19 DIAGNOSIS — R293 Abnormal posture: Secondary | ICD-10-CM | POA: Diagnosis not present

## 2019-08-19 DIAGNOSIS — M25611 Stiffness of right shoulder, not elsewhere classified: Secondary | ICD-10-CM | POA: Diagnosis not present

## 2019-08-19 DIAGNOSIS — M6281 Muscle weakness (generalized): Secondary | ICD-10-CM | POA: Diagnosis not present

## 2019-08-19 NOTE — Therapy (Signed)
[facility] [address] [place], [place], [address] Phone: [phone]   Fax:  [phone]  Physical Therapy Treatment and D/C Summary  Patient Details  Name: Ashley [name] MRN: [id number] Date of Birth: Jul 03, 1976 Referring Provider (PT): Marton Redwood, MD   Encounter Date: 08/19/2019  PT End of Session - 08/19/19 1114    Visit Number  23    Number of Visits  24    Date for PT Re-Evaluation  09/06/19   delayed date due to lack of appointment times   Authorization Type  Medicare and BCBS - 10th visit PN required.  30 VL for OT; 30 VL for PT - 3 used    Authorization - Visit Number  26    Authorization - Number of Visits  30    PT Start Time  1020    PT Stop Time  1106    PT Time Calculation (min)  46 min    Activity Tolerance  Patient tolerated treatment well    Behavior During Therapy  WFL for tasks assessed/performed       Past Medical History:  Diagnosis Date  . Anxiety   . Depression   . ICH (intracerebral hemorrhage) ([place])   . Seizures ([place])   . Stroke [facility])     Past Surgical History:  Procedure Laterality Date  . DILATION AND CURETTAGE OF UTERUS      There were no vitals filed for this visit.  Subjective Assessment - 08/19/19 1024    Subjective  No changes since Wednesday.  Went to [employer] and had T strap glued and leather toe cap re-attached.    Pertinent History  : L CVA with ICH, R hemiplegia, contracture of R ankle muscles, R shoulder adhesive capsulitis, multiple falls with injury, seizures, depression and anxiety    Limitations  Standing;Walking    Patient Stated Goals  More stability, prevent falls, would like to be able to run    Currently in Pain?  No/denies          Gastroc / Heel Cord Stretch - On Step    Stand with heels over edge of stair. Holding rail, lower heels until stretch is felt in calf of legs.  MAKE SURE THE RIGHT KNEE STAYS INWARD TOWARDS THE LEFT Repeat _3__ times.  Do _2__ times per day.   Step-Down: Forward    Stand on first step and lower left leg as far as possible to floor until you feel a stretch in your right calf.  Try to keep your knee pointing forwards as much as you can BY BRINGING YOUR RIGHT HIP BONE FORWARDS.  Hold for 10 seconds and then rest.  Repeat 10 times    Knee Extension: Step-Down Forward / Sideways / Backward (Eccentric)    Stand, holding support, RIGHT foot on step. Slowly bend RIGHT knee AND PERFORM 3 TAPS - FORWARDS - UP - SIDEWAYS - UP - BACK - UP.  Straighten RIGHT leg AS YOU COME BACK UP  _10__ reps per set, _2__ sets per day, _5__ days per week.   Wall Squat    Feet shoulder width apart, squeezing a FOLDED UP PILLOW between the knees, step feet forwards - _12___ inches in front of wall, lean against wall. Heels on floor, knees parallel, bend hips and knees to almost 90; bring your hips slightly to the RIGHT so you feel a slight stretch in the right hip. Hold _5-6___ seconds and then return to standing.  Keep thighs  squeezing pillow the whole time. Repeat __6-8__ times. Do __1-2__ sessions per day.   HIP: Extension / KNEE: Flexion, Tall Kneeling   KNEEL ON KNEES WITH CHAIR IN FRONT OF YOU FOR SUPPORT.  SIT BACK WITH HIPS TO THE RIGHT, HOLD THE STRETCH FOR 2-3 SECONDS AND THEN TUCK YOUR HIPS COMING BACK UPRIGHT.   REPEAT 5-6 TIMES, 1-2 TIMES A DAY.      Hip Internal Rotation (Eccentric) - Prone, Knees Flexed to 90 Degrees    Lie on stomach with Right knee flexed to 90. Have a belt around your foot and pulling on outside of foot.  Slowly lower legs outward and heel towards buttocks x 30 seconds. Relax and then perform again. _4__ reps per set, __2_ sets per day, __7_ days per week.   Knee Extension: Standing (Single Leg)    In stride stance, anchor tubing around table leg and loop around ankle of right foot. Have left leg positioned forwards.  Bring right knee up in the air and kick foot forwards with  control placing heel on floor.  Bring leg back with control. Repeat _10_ times per set. Repeat with other leg. Do _2_ sets per session. Do _5_ sessions per week.   Reverse Clam    LYING ON YOUR LEFT SIDE.  SQUEEZE PILLOW BETWEEN KNEES BY ROTATING RIGHT KNEE DOWN AND LIFT YOUR RIGHT FOOT SLIGHTLY.  HOLD 2-3 SECONDS AND LOWER. REPEAT 5-6 TIMES.   Attempted but unable to safely perform modified side plank due to shoulder instability.  PT Education - 08/19/19 1113    Education provided  Yes    Education Details  Final HEP with modifications    Person(s) Educated  Patient    Methods  Explanation;Demonstration;Handout    Comprehension  Verbalized understanding;Returned demonstration       PT Short Term Goals - 07/08/19 1122      PT SHORT TERM GOAL #1   Title  = LTG        PT Long Term Goals - 08/19/19 1114      PT LONG TERM GOAL #1   Title  Pt will demonstrate independence with final HEP and improved RLE mechanics on elliptical when using for aerobic exercise (date reflects delay in start of new visits; 4 week total POC)    Time  4    Period  Weeks    Status  Achieved      PT LONG TERM GOAL #2   Title  Pt will ambulate 1,000 outside over uneven terrain, negotiate curbs and over large obstacles MOD I with AFO and leather toe cap safely without catching R toe    Time  4    Period  Weeks    Status  Achieved      PT LONG TERM GOAL #3   Title  Pt will demonstrate improvement in gait velocity with new AFO and leather toe cap to 4.72f/sec    Baseline  3.8 with leather toe cap    Time  4    Period  Weeks    Status  Achieved      PT LONG TERM GOAL #4   Title  Pt will negotiate 12 stairs with one rail MOD I alternating sequence while maintaining R hip in neutral rotation due to improved weight shifting.    Time  4    Period  Weeks    Status  Partially Met            Plan - 08/19/19 1115  Clinical Impression Statement  Pt has made excellent progress and has met 3/4  LTG, partially meeting stair goal due to continued difficulty controlling R hip rotation when descending - pt better able to control rotation after stretching and when descending slowly where she can focus on hip and LE position.  Pt is ambulating independently and more safely with new AFO and leather toe cap with no falls.  Updated and finalized high level HEP.  Pt is ready for D/C.    Personal Factors and Comorbidities  Comorbidity 2;Time since onset of injury/illness/exacerbation;Age    Comorbidities  L CVA with ICH, R hemiplegia, contracture of R ankle muscles, R shoulder adhesive capsulitis, multiple falls with injury, seizures, depression and anxiety, pt is a mother to young children    Examination-Activity Limitations  Locomotion Level;Squat;Stairs;Stand;Caring for Others    Examination-Participation Restrictions  Cleaning;Shop;Community Activity    Stability/Clinical Decision Making  Evolving/Moderate complexity    Rehab Potential  Good    Clinical Impairments Affecting Rehab Potential  time since onset, mm contracture in R ankle    PT Frequency  2x / week    PT Duration  4 weeks    PT Treatment/Interventions  ADLs/Self Care Home Management;Electrical Stimulation;Gait training;Stair training;Functional mobility training;Therapeutic activities;Therapeutic exercise;Balance training;Neuromuscular re-education;Patient/family education;Orthotic Fit/Training;Passive range of motion;Dry needling;Taping    Consulted and Agree with Plan of Care  Patient       Patient will benefit from skilled therapeutic intervention in order to improve the following deficits and impairments:  Abnormal gait, Decreased balance, Decreased mobility, Decreased range of motion, Decreased strength, Difficulty walking, Hypomobility, Increased muscle spasms, Impaired flexibility, Impaired tone, Impaired sensation  Visit Diagnosis: Unsteadiness on feet  Muscle weakness (generalized)  Stiffness of right ankle, not  elsewhere classified  Repeated falls  Spastic hemiplegia of right dominant side as late effect of other cerebrovascular disease ([place])     Problem List Patient Active Problem List   Diagnosis Date Noted  . Depression 03/23/2017  . Anxiety 03/23/2017  . Muscle weakness 02/03/2017  . History of hemorrhagic stroke with residual hemiparesis ([place]) 12/10/2016  . Spastic hemiplegia and hemiparesis affecting dominant side ([place]) 09/13/2015  . Adhesive capsulitis of right shoulder 08/24/2015  . Localization-related symptomatic epilepsy and epileptic syndromes with simple partial seizures, not intractable, without status epilepticus ([place]) 08/21/2015  . Nontraumatic cortical hemorrhage of cerebral hemisphere ([place])   . Seizure ([place])   . Seizures ([place])   . Adjustment disorder with depressed mood   . Contracture of muscle ankle and foot 06/11/2015  . Hemiplga fol ntrm intcrbl hemor aff right dominant side ([place]) 06/06/2015  . Left-sided intracerebral hemorrhage ([place]) 06/04/2015  . Seizure disorder as sequela of cerebrovascular accident ([place])   . Seizure disorder ([place]) 06/03/2015  . Sepsis ([place]) 06/03/2015  . UTI (urinary tract infection) 06/03/2015  . Hypotension 06/02/2015  . Right spastic hemiparesis ([place]) 05/28/2015  . Aphasia following nontraumatic intracerebral hemorrhage 05/28/2015  . History of anxiety disorder 05/28/2015  . ICH (intracerebral hemorrhage) ([employer])   . Seizure disorder, nonconvulsive, with status epilepticus ([place])   . Cerebral venous thrombosis of cortical vein   . Cytotoxic cerebral edema ([place])   . IVH (intraventricular hemorrhage) ([place])   . Term pregnancy 05/07/2015  . Spontaneous vaginal delivery 05/07/2015   PHYSICAL THERAPY DISCHARGE SUMMARY  Visits from Start of Care: 23  Current functional level related to goals / functional outcomes: See LTG achievement and impression statement above   Remaining deficits: Spastic hemiplegia R side, impaired ROM,  impaired  gait   Education / Equipment: HEP  Plan: Patient agrees to discharge.  Patient goals were met. Patient is being discharged due to meeting the stated rehab goals.  ?????     [name], PT, DPT 08/19/19    11:19 AM   [facility] [address] [place], [place], [address] Phone: [phone]   Fax:  [phone]  Name: Ashley [name] MRN: [id number] Date of Birth: [birth year]

## 2019-08-19 NOTE — Patient Instructions (Signed)
Gastroc / Heel Cord Stretch - On Step    Stand with heels over edge of stair. Holding rail, lower heels until stretch is felt in calf of legs.  MAKE SURE THE RIGHT KNEE STAYS INWARD TOWARDS THE LEFT Repeat _3__ times. Do _2__ times per day.   Step-Down: Forward    Stand on first step and lower left leg as far as possible to floor until you feel a stretch in your right calf.  Try to keep your knee pointing forwards as much as you can BY BRINGING YOUR RIGHT HIP BONE FORWARDS.  Hold for 10 seconds and then rest.  Repeat 10 times    Knee Extension: Step-Down Forward / Sideways / Backward (Eccentric)    Stand, holding support, RIGHT foot on step. Slowly bend RIGHT knee AND PERFORM 3 TAPS - FORWARDS - UP - SIDEWAYS - UP - BACK - UP.  Straighten RIGHT leg AS YOU COME BACK UP  _10__ reps per set, _2__ sets per day, _5__ days per week.   Wall Squat    Feet shoulder width apart, squeezing a FOLDED UP PILLOW between the knees, step feet forwards - _12___ inches in front of wall, lean against wall. Heels on floor, knees parallel, bend hips and knees to almost 90; bring your hips slightly to the RIGHT so you feel a slight stretch in the right hip. Hold _5-6___ seconds and then return to standing.  Keep thighs squeezing pillow the whole time. Repeat __6-8__ times. Do __1-2__ sessions per day.   HIP: Extension / KNEE: Flexion, Tall Kneeling   KNEEL ON KNEES WITH CHAIR IN FRONT OF YOU FOR SUPPORT.  SIT BACK WITH HIPS TO THE RIGHT, HOLD THE STRETCH FOR 2-3 SECONDS AND THEN TUCK YOUR HIPS COMING BACK UPRIGHT.   REPEAT 5-6 TIMES, 1-2 TIMES A DAY.      Hip Internal Rotation (Eccentric) - Prone, Knees Flexed to 90 Degrees    Lie on stomach with Right knee flexed to 90. Have a belt around your foot and pulling on outside of foot.  Slowly lower legs outward and heel towards buttocks x 30 seconds. Relax and then perform again. _4__ reps per set, __2_ sets per day, __7_ days per week.   Knee  Extension: Standing (Single Leg)    In stride stance, anchor tubing around table leg and loop around ankle of right foot. Have left leg positioned forwards.  Bring right knee up in the air and kick foot forwards with control placing heel on floor.  Bring leg back with control. Repeat _10_ times per set. Repeat with other leg. Do _2_ sets per session. Do _5_ sessions per week.   Reverse Clam    LYING ON YOUR LEFT SIDE.  SQUEEZE PILLOW BETWEEN KNEES BY ROTATING RIGHT KNEE DOWN AND LIFT YOUR RIGHT FOOT SLIGHTLY.  HOLD 2-3 SECONDS AND LOWER. REPEAT 5-6 TIMES.

## 2019-08-31 ENCOUNTER — Other Ambulatory Visit: Payer: Self-pay

## 2019-08-31 ENCOUNTER — Encounter: Payer: Self-pay | Admitting: Occupational Therapy

## 2019-08-31 ENCOUNTER — Ambulatory Visit: Payer: Medicare Other | Admitting: Occupational Therapy

## 2019-08-31 DIAGNOSIS — M25671 Stiffness of right ankle, not elsewhere classified: Secondary | ICD-10-CM | POA: Diagnosis not present

## 2019-08-31 DIAGNOSIS — M25611 Stiffness of right shoulder, not elsewhere classified: Secondary | ICD-10-CM | POA: Diagnosis not present

## 2019-08-31 DIAGNOSIS — R2681 Unsteadiness on feet: Secondary | ICD-10-CM | POA: Diagnosis not present

## 2019-08-31 DIAGNOSIS — R296 Repeated falls: Secondary | ICD-10-CM | POA: Diagnosis not present

## 2019-08-31 DIAGNOSIS — I69851 Hemiplegia and hemiparesis following other cerebrovascular disease affecting right dominant side: Secondary | ICD-10-CM | POA: Diagnosis not present

## 2019-08-31 DIAGNOSIS — M6281 Muscle weakness (generalized): Secondary | ICD-10-CM

## 2019-08-31 DIAGNOSIS — R293 Abnormal posture: Secondary | ICD-10-CM | POA: Diagnosis not present

## 2019-08-31 NOTE — Therapy (Signed)
[facility] [address] [place], [place], [address] Phone: [phone]   Fax:  [phone]  Occupational Therapy Treatment  Patient Details  Name: Ashley [name] MRN: [id number] Date of Birth: [birth year] Referring Provider (OT): Dr Marton Redwood   Encounter Date: 08/31/2019  OT End of Session - 08/31/19 1801    Visit Number  17    Number of Visits  20    Date for OT Re-Evaluation  09/14/19    Authorization Type  medicare with BCBS - will need PN every 10th visit    Authorization - Visit Number  77    Authorization - Number of Visits  20    OT Start Time  1325    OT Stop Time  1415    OT Time Calculation (min)  50 min    Activity Tolerance  Patient tolerated treatment well       Past Medical History:  Diagnosis Date  . Anxiety   . Depression   . ICH (intracerebral hemorrhage) ([place])   . Seizures ([place])   . Stroke [facility])     Past Surgical History:  Procedure Laterality Date  . DILATION AND CURETTAGE OF UTERUS      There were no vitals filed for this visit.  Subjective Assessment - 08/31/19 1757    Subjective   I am going out of town next week so I need to reschedule my last aquatic therapy appt    Pertinent History  see epic snapshot; pt with [facility] 2 weeks after deliverying her 3rd child    Patient Stated Goals  strength of arm and get my R leg working better    Currently in Pain?  No/denies       Patient seen for aquatic therapy today.  Treatment took place in water 2.5-4 feet deep depending upon activity.  Pt entered the pool via steps and 1 hand railings mod I.  Treatment today focused on continued education for pt for aquatic HEP. Educated pt on all upright activities as well as incorporation of basic principles of water to allow pt the ability to shift focus of her HEP (I.e. different positions to complete activities, different types of floatation devices for varied resistance, deeper vs shallower water,  faster vs slower movement, etc).  Pt asked several questions and verbalized understanding.  Will complete education on horizontal activities next session and d/c from OT.                     OT Education - 08/31/19 1758    Education Details  continued education [id number] therapy HEP    Person(s) Educated  Patient    Methods  Explanation;Demonstration    Comprehension  Verbalized understanding;Need further instruction;Returned demonstration       OT Short Term Goals - 08/31/19 1758      OT SHORT TERM GOAL #1   Title  Pt will demonstrate improved overhead reach to at least 130* of shoulder flexion RUE  to assist with heavier overhead home mgmt tasks -7/8//2020 (goal date adjusted to meet 4 visits)    Status  Achieved      OT SHORT TERM GOAL #2   Title  Pt will demonstrate improved RUE strength /core strength to assist with lifting heavy items in grocery store with greater ease (per pt report)    Status  Achieved      OT SHORT TERM GOAL #3   Title  Pt will demonstrate ability to lift and  carry a 5 pound object in the grocery store with her RUE with no LOB - 06/22/2019 (date adjusted due to hold first two weeks in August due to scheduling)    Status  Achieved        OT Long Term Goals - 08/31/19 1759      OT LONG TERM GOAL #1   Title  Pt will be mod I with upgraded HEP to include aquatic based HEP -09/14/2019    Status  On-going      OT LONG TERM GOAL #2   Title  Pt will demonstrate improved postural alignment and control to reduce falls (has been falling at least 1x/month)    Status  Achieved   pt reports she has not fallen in almost three months           Plan - 08/31/19 1759    Clinical Impression Statement  Pt progressing toward remaining goal of demonstrating understanding of aquatic HEP.  Provided answers to several questions pt had regarding program.    OT Occupational Profile and History  Problem Focused Assessment - Including review of records  relating to presenting problem    Occupational performance deficits (Please refer to evaluation for details):  IADL's;Leisure;Social Participation    Body Structure / Function / Physical Skills  Balance;Mobility;UE functional use;Strength;IADL;Tone;ROM;GMC    Rehab Potential  Good    Clinical Decision Making  Limited treatment options, no task modification necessary    Comorbidities Affecting Occupational Performance:  May have comorbidities impacting occupational performance    Modification or Assistance to Complete Evaluation   No modification of tasks or assist necessary to complete eval    OT Frequency  1x / week    OT Duration  4 weeks    OT Treatment/Interventions  Aquatic Therapy;Self-care/ADL training;Neuromuscular education;Therapeutic exercise;Functional Mobility Training;Balance training    Plan  complete  pt education for aquatic HEP and then d/c from OT    Consulted and Agree with Plan of Care  Patient       Patient will benefit from skilled therapeutic intervention in order to improve the following deficits and impairments:   Body Structure / Function / Physical Skills: Balance, Mobility, UE functional use, Strength, IADL, Tone, ROM, GMC       Visit Diagnosis: Unsteadiness on feet  Muscle weakness (generalized)  Stiffness of right ankle, not elsewhere classified    Problem List Patient Active Problem List   Diagnosis Date Noted  . Depression 03/23/2017  . Anxiety 03/23/2017  . Muscle weakness 02/03/2017  . History of hemorrhagic stroke with residual hemiparesis ([place]) 12/10/2016  . Spastic hemiplegia and hemiparesis affecting dominant side ([place]) 09/13/2015  . Adhesive capsulitis of right shoulder 08/24/2015  . Localization-related symptomatic epilepsy and epileptic syndromes with simple partial seizures, not intractable, without status epilepticus ([place]) 08/21/2015  . Nontraumatic cortical hemorrhage of cerebral hemisphere ([place])   . Seizure ([place])   . Seizures  ([place])   . Adjustment disorder with depressed mood   . Contracture of muscle ankle and foot 06/11/2015  . Hemiplga fol ntrm intcrbl hemor aff right dominant side ([place]) 06/06/2015  . Left-sided intracerebral hemorrhage ([place]) 06/04/2015  . Seizure disorder as sequela of cerebrovascular accident ([place])   . Seizure disorder ([place]) 06/03/2015  . Sepsis ([place]) 06/03/2015  . UTI (urinary tract infection) 06/03/2015  . Hypotension 06/02/2015  . Right spastic hemiparesis ([place]) 05/28/2015  . Aphasia following nontraumatic intracerebral hemorrhage 05/28/2015  . History of anxiety disorder 05/28/2015  . ICH (intracerebral hemorrhage) (  [place])   . Seizure disorder, nonconvulsive, with status epilepticus ([place])   . Cerebral venous thrombosis of cortical vein   . Cytotoxic cerebral edema ([place])   . IVH (intraventricular hemorrhage) ([place])   . Term pregnancy 05/07/2015  . Spontaneous vaginal delivery 05/07/2015    Quay Burow, OTR/L 08/31/2019, 6:02 PM  [facility] [address] [place], [place], [address] Phone: [phone]   Fax:  [phone]  Name: Demetri Lawrenson MRN: [id number] Date of Birth: [birth year]

## 2019-09-07 ENCOUNTER — Ambulatory Visit: Payer: Medicare Other | Admitting: Occupational Therapy

## 2019-09-19 ENCOUNTER — Emergency Department (HOSPITAL_COMMUNITY)
Admission: EM | Admit: 2019-09-19 | Discharge: 2019-09-19 | Disposition: A | Discharge status: Home / Self Care | Payer: Medicare Other | Attending: Emergency Medicine | Admitting: Emergency Medicine

## 2019-09-19 ENCOUNTER — Ambulatory Visit: Payer: Medicare Other | Admitting: Occupational Therapy

## 2019-09-19 ENCOUNTER — Other Ambulatory Visit: Payer: Self-pay

## 2019-09-19 ENCOUNTER — Emergency Department (HOSPITAL_COMMUNITY): Payer: Medicare Other

## 2019-09-19 DIAGNOSIS — S8991XA Unspecified injury of right lower leg, initial encounter: Secondary | ICD-10-CM | POA: Insufficient documentation

## 2019-09-19 DIAGNOSIS — Y998 Other external cause status: Secondary | ICD-10-CM | POA: Insufficient documentation

## 2019-09-19 DIAGNOSIS — W010XXA Fall on same level from slipping, tripping and stumbling without subsequent striking against object, initial encounter: Secondary | ICD-10-CM | POA: Insufficient documentation

## 2019-09-19 DIAGNOSIS — R0781 Pleurodynia: Secondary | ICD-10-CM | POA: Diagnosis not present

## 2019-09-19 DIAGNOSIS — Y9301 Activity, walking, marching and hiking: Secondary | ICD-10-CM | POA: Diagnosis not present

## 2019-09-19 DIAGNOSIS — Y92480 Sidewalk as the place of occurrence of the external cause: Secondary | ICD-10-CM | POA: Diagnosis not present

## 2019-09-19 DIAGNOSIS — R079 Chest pain, unspecified: Secondary | ICD-10-CM | POA: Diagnosis not present

## 2019-09-19 DIAGNOSIS — M25561 Pain in right knee: Secondary | ICD-10-CM

## 2019-09-19 DIAGNOSIS — S0181XA Laceration without foreign body of other part of head, initial encounter: Secondary | ICD-10-CM | POA: Diagnosis not present

## 2019-09-19 DIAGNOSIS — T148XXA Other injury of unspecified body region, initial encounter: Secondary | ICD-10-CM

## 2019-09-19 DIAGNOSIS — S80212A Abrasion, left knee, initial encounter: Secondary | ICD-10-CM | POA: Diagnosis not present

## 2019-09-19 DIAGNOSIS — S8992XA Unspecified injury of left lower leg, initial encounter: Secondary | ICD-10-CM | POA: Diagnosis not present

## 2019-09-19 DIAGNOSIS — W19XXXA Unspecified fall, initial encounter: Secondary | ICD-10-CM

## 2019-09-19 DIAGNOSIS — S40911A Unspecified superficial injury of right shoulder, initial encounter: Secondary | ICD-10-CM | POA: Diagnosis not present

## 2019-09-19 DIAGNOSIS — M25511 Pain in right shoulder: Secondary | ICD-10-CM | POA: Diagnosis not present

## 2019-09-19 DIAGNOSIS — R0789 Other chest pain: Secondary | ICD-10-CM

## 2019-09-19 DIAGNOSIS — M25562 Pain in left knee: Secondary | ICD-10-CM

## 2019-09-19 DIAGNOSIS — S80211A Abrasion, right knee, initial encounter: Secondary | ICD-10-CM | POA: Diagnosis not present

## 2019-09-19 MED ORDER — LIDOCAINE-EPINEPHRINE 2 %-1:100000 IJ SOLN
20.0000 mL | Freq: Once | INTRAMUSCULAR | Status: AC
  Administered 2019-09-19: 17:00:00 20 mL via INTRADERMAL
  Filled 2019-09-19: qty 1

## 2019-09-19 NOTE — ED Triage Notes (Signed)
Pt c/o fall at approx 1300, fell on her chin. Pt c/o chin lac, right shoulder pain. Pt states she falls more since her stroke in 2016.

## 2019-09-19 NOTE — ED Notes (Signed)
An After Visit Summary was printed and given to the patient. °Discharge instructions given and no further questions at this time.  °Pt leaving with mother. °

## 2019-09-19 NOTE — Discharge Instructions (Signed)
1. Medications: Alternate 600 mg of ibuprofen and [phone] mg of Tylenol every 3 hours as needed for pain. Do not exceed 4000 mg of Tylenol daily.  Take ibuprofen with food to avoid upset stomach issues. 2. Treatment: ice for swelling, keep wound clean with warm soap and water and keep bandage dry, do not submerge in water for 24 hours 3. Follow Up: Please return in 5 days to have your stitches/staples removed or sooner if you have concerns.  You may also go to urgent care or your primary care physician.  Return to the emergency department sooner if any concerning signs or symptoms develop such as severe headaches, persistent vomiting, high fevers, redness, drainage of pus from the wound, or swelling.   WOUND CARE  Keep area clean and dry for 24 hours. Do not remove bandage, if applied.  After 24 hours, remove bandage and wash wound gently with mild soap and warm water. Reapply a new bandage after cleaning wound, if directed.   Continue daily cleansing with soap and water until stitches/staples are removed.  Do not apply any ointments or creams to the wound while stitches/staples are in place, as this may cause delayed healing. Return if you experience any of the following signs of infection: Swelling, redness, pus drainage, streaking, fever >101.0 F  Return if you experience excessive bleeding that does not stop after 15-20 minutes of constant, firm pressure.

## 2019-09-19 NOTE — ED Provider Notes (Signed)
[facility] DEPT Provider Note   CSN: [id number] Arrival date & time: 09/19/19  1323     History   Chief Complaint Chief Complaint  Patient presents with   Chin Laceration   Right Shoulder Pain    HPI Tanaiyah Christie is a 43 y.o. female with history of hemorrhagic stroke with residual right-sided hemiparesis and sensory changes, seizures, anxiety and depression presenting for evaluation of acute onset, persistent wound to the chin as well as right shoulder and bilateral knee pain secondary to mechanical fall that occurred around 1 PM.  Reports that she was walking out of her son's daycare and was "rushing" and ended up tripping, landing with her chin on the gravel.  Denies loss of consciousness.  Denies amnesia, nausea, vomiting, vision changes.  She has chronic numbness and weakness of the right side of her face and extremities secondary to her stroke and chronic right foot drop.  Reports this is persistent and unchanged.  Notes mild frontal headache that began while waiting in the ED but states this does not bother her very much.  Does note some right sided chest wall tenderness and right shoulder pain worsens with certain movements.  Landed on her knees and reports bilateral knee pain but has been able to bear weight without difficulty.  She takes a baby aspirin daily but no other anticoagulation.  Her tetanus is up-to-date.     The history is provided by the patient.    Past Medical History:  Diagnosis Date   Anxiety    Depression    ICH (intracerebral hemorrhage) ([facility])    Seizures ([place])    Stroke [facility])     Patient Active Problem List   Diagnosis Date Noted   Depression 03/23/2017   Anxiety 03/23/2017   Muscle weakness 02/03/2017   History of hemorrhagic stroke with residual hemiparesis ([place]) 12/10/2016   Spastic hemiplegia and hemiparesis affecting dominant side ([place]) 09/13/2015   Adhesive capsulitis of right shoulder 08/24/2015     Localization-related symptomatic epilepsy and epileptic syndromes with simple partial seizures, not intractable, without status epilepticus ([place]) 08/21/2015   Nontraumatic cortical hemorrhage of cerebral hemisphere [facility])    Seizure ([place])    Seizures ([place])    Adjustment disorder with depressed mood    Contracture of muscle ankle and foot 06/11/2015   Hemiplga fol ntrm intcrbl hemor aff right dominant side ([place]) 06/06/2015   Left-sided intracerebral hemorrhage ([place]) 06/04/2015   Seizure disorder as sequela of cerebrovascular accident [facility])    Seizure disorder ([place]) 06/03/2015   Sepsis ([place]) 06/03/2015   UTI (urinary tract infection) 06/03/2015   Hypotension 06/02/2015   Right spastic hemiparesis ([place]) 05/28/2015   Aphasia following nontraumatic intracerebral hemorrhage 05/28/2015   History of anxiety disorder 05/28/2015   ICH (intracerebral hemorrhage) ([facility])    Seizure disorder, nonconvulsive, with status epilepticus ([place])    Cerebral venous thrombosis of cortical vein    Cytotoxic cerebral edema ([facility])    IVH (intraventricular hemorrhage) ([place])    Term pregnancy 05/07/2015   Spontaneous vaginal delivery 05/07/2015    Past Surgical History:  Procedure Laterality Date   DILATION AND CURETTAGE OF UTERUS       OB History    Gravida  9   Para  3   Term  3   Preterm      AB  6   Living  3     SAB  3   TAB  2   Ectopic  Multiple  0   Live Births  3            Home Medications    Prior to Admission medications   Medication Sig Start Date End Date Taking? Authorizing Provider  aspirin 81 MG chewable tablet Chew 81 mg by mouth daily.    [provider]  Cholecalciferol (VITAMIN D) 2000 units CAPS Take 2,000 Units by mouth daily.    [provider]  FLUoxetine (PROZAC) 20 MG capsule Take 1 capsule (20 mg total) by mouth daily. Take with 40 mg for total of 60 mg daily 06/08/18 06/08/19  Aundra Dubin, MD   FLUoxetine (PROZAC) 40 MG capsule Take 1 capsule (40 mg total) by mouth daily. 06/08/18   Eksir, Richard Miu, MD  lamoTRIgine (LAMICTAL) 200 MG tablet Take 1 tablet (200 mg total) by mouth 2 (two) times daily. Take 200 mg BID 05/04/19   Cameron Sprang, MD  levETIRAcetam (KEPPRA) 750 MG tablet Take 2 tablets (1,500 mg total) by mouth 2 (two) times daily. 05/04/19   Cameron Sprang, MD  linaclotide [facility]) 145 MCG CAPS capsule Take 145 mcg by mouth daily before breakfast.    [provider]  LORazepam (ATIVAN) 1 MG tablet Take 1 tablet as needed for seizures 05/04/19   Cameron Sprang, MD  magnesium oxide (MAG-OX) 400 MG tablet Take 400 mg by mouth 2 (two) times daily.    [provider]  mirtazapine (REMERON) 30 MG tablet Take 1 tablet (30 mg total) by mouth at bedtime. 06/08/18 06/08/19  Aundra Dubin, MD  Multiple Vitamin (MULTIVITAMIN) tablet Take 1 tablet by mouth daily.     [provider]    Family History Family History  Problem Relation Age of Onset   Cancer Mother    Cancer Father        pancreatic    Social History Social History   Tobacco Use   Smoking status: Never Smoker   Smokeless tobacco: Never Used  Substance Use Topics   Alcohol use: Yes    Alcohol/week: 1.0 standard drinks    Types: 1 Glasses of wine per week    Comment: casual    Drug use: No     Allergies   Patient has no known allergies.   Review of Systems Review of Systems  Eyes: Negative for photophobia and visual disturbance.  Respiratory: Negative for shortness of breath.   Cardiovascular: Positive for chest pain (chest wall pain).  Gastrointestinal: Negative for abdominal pain, nausea and vomiting.  Musculoskeletal: Positive for arthralgias. Negative for back pain and neck pain.  Skin: Positive for wound.  Neurological: Positive for weakness (chronic), numbness (chronic) and headaches (mild).  All other systems reviewed and are  negative.    Physical Exam Updated Vital Signs BP 105/71    Pulse 70    Temp 98.2 F (36.8 C) (Oral)    Resp 18    SpO2 99%   Physical Exam Vitals signs and nursing note reviewed.  Constitutional:      General: She is not in acute distress.    Appearance: She is well-developed.  HENT:     Head: Normocephalic.     Comments: 2 cm laceration to the underside of the chin, mildly gaping.  Bleeding controlled.  No jaw malocclusion.  No trismus.  Dentition appears stable. No Battle's signs, no raccoon's eyes, no rhinorrhea. No hemotympanum. No tenderness to palpation of the face or skull, aside from mild tenderness around laceration.  Eyes:     General:        Right eye: No discharge.        Left eye: No discharge.     Conjunctiva/sclera: Conjunctivae normal.  Neck:     Musculoskeletal: Normal range of motion and neck supple.     Vascular: No JVD.     Trachea: No tracheal deviation.     Comments: No midline cervical spine tenderness, no deformity, crepitus, or step-off.  Mild right paracervical muscle tenderness. Cardiovascular:     Rate and Rhythm: Normal rate.  Pulmonary:     Effort: Pulmonary effort is normal.     Comments: Right lateral chest wall tenderness with no deformity, crepitus, ecchymosis, or flail segment. Chest:     Chest wall: Tenderness present.  Abdominal:     General: Bowel sounds are normal. There is no distension.     Palpations: Abdomen is soft.     Tenderness: There is no abdominal tenderness. There is no guarding or rebound.  Musculoskeletal:     Comments: No lumbar spine tenderness.  Mild thoracic spine tenderness at around the levels of T2-T4 with no deformity, crepitus, or step-off.  Mild tenderness to palpation of the anterior aspect of the right shoulder.  Normal passive range of motion of joints.  Pelvis appears stable.  Superficial abrasion to the anterior aspect of the left knee.  No ligamentous laxity or varus or valgus instability.  Negative  anterior/posterior drawer test bilaterally on examination of the knees.  Chronic right foot drop.  4/5 strength of right upper and lower extremity major muscle groups, 5/5 strength of left upper extremity and lower extremity major muscle groups.  Skin:    General: Skin is warm and dry.     Findings: No erythema.  Neurological:     Mental Status: She is alert.     Comments: Ambulatory with steady gait and balance.  Chronic right foot drop.  Altered sensation to light touch of the face and extremities on the right which patient reports is chronic since her stroke.  Cranial nerves are grossly intact.  Psychiatric:        Behavior: Behavior normal.      ED Treatments / Results  Labs (all labs ordered are listed, but only abnormal results are displayed) Labs Reviewed - No data to display  EKG None  Radiology Dg Ribs Unilateral W/chest Right  Result Date: 09/19/2019 CLINICAL DATA:  Right rib pain after fall today. EXAM: RIGHT RIBS AND CHEST - 3+ VIEW COMPARISON:  None. FINDINGS: No fracture or other bone lesions are seen involving the ribs. There is no evidence of pneumothorax or pleural effusion. Both lungs are clear. Heart size and mediastinal contours are within normal limits. IMPRESSION: Negative. Electronically Signed   By: Marijo Conception M.D.   On: 09/19/2019 15:44   Dg Shoulder Right  Result Date: 09/19/2019 CLINICAL DATA:  Right shoulder pain after fall today. EXAM: RIGHT SHOULDER - 2+ VIEW COMPARISON:  None. FINDINGS: There is no evidence of fracture or dislocation. There is no evidence of arthropathy or other focal bone abnormality. Soft tissues are unremarkable. IMPRESSION: Negative. Electronically Signed   By: Marijo Conception M.D.   On: 09/19/2019 15:45    Procedures .Marland KitchenLaceration Repair  Date/Time: 09/19/2019 4:26 PM Performed by: Renita Papa, PA-C Authorized by: Renita Papa, PA-C   Consent:    Consent obtained:  Verbal   Consent given by:  Patient   Risks  discussed:  Infection,  need for additional repair, pain, poor cosmetic result and poor wound healing   Alternatives discussed:  No treatment and delayed treatment Universal protocol:    Procedure explained and questions answered to patient or proxy's satisfaction: yes     Relevant documents present and verified: yes     Test results available and properly labeled: yes     Imaging studies available: yes     Required blood products, implants, devices, and special equipment available: yes     Site/side marked: yes     Immediately prior to procedure, a time out was called: yes     Patient identity confirmed:  Verbally with patient Anesthesia (see MAR for exact dosages):    Anesthesia method:  Local infiltration Laceration details:    Location:  Face   Face location:  Chin   Length (cm):  2   Depth (mm):  2 Repair type:    Repair type:  Simple Exploration:    Hemostasis achieved with:  Cautery   Wound exploration: wound explored through full range of motion     Wound extent: fascia violated     Wound extent: no underlying fracture noted   Treatment:    Area cleansed with:  Betadine and saline   Amount of cleaning:  Extensive   Irrigation solution:  Sterile saline   Irrigation method:  Pressure wash   Visualized foreign bodies/material removed: no   Skin repair:    Repair method:  Sutures   Suture size:  5-0   Suture material:  Nylon (ethilon)   Suture technique:  Simple interrupted   Number of sutures:  5 Approximation:    Approximation:  Close Post-procedure details:    Dressing:  Sterile dressing   Patient tolerance of procedure:  Tolerated well, no immediate complications   (including critical care time)  Medications Ordered in ED Medications  lidocaine-EPINEPHrine (XYLOCAINE W/EPI) 2 %-1:100000 (with pres) injection 20 mL (20 mLs Intradermal Given 09/19/19 1645)     Initial Impression / Assessment and Plan / ED Course  I have reviewed the triage vital signs and the  nursing notes.  Pertinent labs & imaging results that were available during my care of the patient were reviewed by me and considered in my medical decision making (see chart for details).        Patient presenting for evaluation after mechanical fall.  Reports she has frequent falls secondary to residual right-sided weakness from prior stroke.  Landed on her chin.  No loss of consciousness.  While in the ED a couple of hours after her fall she developed very mild frontal headache but reports her primary concern is pain to her knees, right shoulder, right chest wall, and chin laceration.  She is up-to-date on her tetanus.  Her neurologic examination is at her baseline with no new focal deficits.  She is ambulatory without difficulty, uses a brace for her right foot drop.  Given that she is weightbearing and can range her knees actively without difficulty have a low suspicion of acute fracture or significant ligamentous injury.  We discussed the utility of head imaging and she would like to hold off at this time which I think is reasonable given her headache is very mild and she has no red flag signs concerning for [facility], ICH, skull fracture, or serious head injury.  We did discuss concussion precautions and follow-up with PCP if headaches persist, return to the ED if headaches worsen or if she has any new neurologic findings.  Radiographs of the chest and right shoulder show no evidence of acute osseous abnormality, pneumothorax, or hemothorax.  Abdomen is soft and nontender.  No midline spine tenderness otherwise. Pressure irrigation performed. Wound explored and base of wound visualized in a bloodless field without evidence of foreign body.  Laceration occurred < 8 hours prior to repair which was well tolerated. Patient discharged without antibiotics.  Discussed suture home care with patient and answered questions. Pt to follow-up for wound check and suture removal in 7 days.  Discussed indications for  return to the ED sooner.  Patient and mother verbalized understanding of and agreement with plan and patient stable for discharge home at this time.  Final Clinical Impressions(s) / ED Diagnoses   Final diagnoses:  Chin laceration, initial encounter  Fall, initial encounter  Acute chest wall pain  Acute pain of right shoulder  Acute pain of both knees  Abrasion of skin    ED Discharge Orders    None       Debroah Baller 09/20/19 1157    Lacretia Leigh, MD 09/21/19 1332

## 2019-09-20 ENCOUNTER — Encounter: Payer: Self-pay | Admitting: Physical Medicine & Rehabilitation

## 2019-09-20 ENCOUNTER — Other Ambulatory Visit: Payer: Self-pay

## 2019-09-20 ENCOUNTER — Encounter: Payer: Medicare Other | Attending: Physical Medicine & Rehabilitation | Admitting: Physical Medicine & Rehabilitation

## 2019-09-20 VITALS — BP 101/66 | HR 79 | Temp 97.9°F | Ht 63.0 in | Wt 122.0 lb

## 2019-09-20 DIAGNOSIS — G8110 Spastic hemiplegia affecting unspecified side: Secondary | ICD-10-CM | POA: Diagnosis not present

## 2019-09-20 NOTE — Patient Instructions (Signed)
Same dose and muscle groups will be injected

## 2019-09-20 NOTE — Progress Notes (Signed)
Subjective:    Patient ID: Ashley [name], female    DOB: [birth year], 43 y.o.   MRN: [id number]  HPI 43 year old female with postpartum stroke approximately 4 years ago.  She completed inpatient rehab as well as outpatient rehabilitation.  She has had chronic right lower extremity spasticity affecting the toe flexors foot inverters and plantar flexors.  Despite this she has been independent with all self-care and mobility and is taking care of her children.  She is also working out on the treadmill. Patient unfortunately fell yesterday while picking up her son from daycare, she was rushing and tripped on some gravel falling onto her chin.  She required 5 sutures.  She had no other injuries.  Pain Inventory Average Pain 0 Pain Right Now 0 My pain is no pain  In the last 24 hours, has pain interfered with the following? General activity 0 Relation with others 0 Enjoyment of life 0 What TIME of day is your pain at its worst? no pain Sleep (in general) Good  Pain is worse with: no pain Pain improves with: no pain Relief from Meds: no pain  Mobility walk without assistance  Function disabled: date disabled .  Neuro/Psych depression anxiety  Prior Studies Any changes since last visit?  no  Physicians involved in your care Any changes since last visit?  no   Family History  Problem Relation Age of Onset  . Cancer Mother   . Cancer Father        pancreatic   Social History   Socioeconomic History  . Marital status: Married    Spouse name: Not on file  . Number of children: Not on file  . Years of education: Not on file  . Highest education level: Not on file  Occupational History  . Not on file  Social Needs  . Financial resource strain: Not on file  . Food insecurity    Worry: Not on file    Inability: Not on file  . Transportation needs    Medical: Not on file    Non-medical: Not on file  Tobacco Use  . Smoking status: Never Smoker  . Smokeless tobacco:  Never Used  Substance and Sexual Activity  . Alcohol use: Yes    Alcohol/week: 1.0 standard drinks    Types: 1 Glasses of wine per week    Comment: casual   . Drug use: No  . Sexual activity: Yes    Birth control/protection: None    Comment: husband has vasectomy  Lifestyle  . Physical activity    Days per week: Not on file    Minutes per session: Not on file  . Stress: Not on file  Relationships  . Social Herbalist on phone: Not on file    Gets together: Not on file    Attends religious service: Not on file    Active member of club or organization: Not on file    Attends meetings of clubs or organizations: Not on file    Relationship status: Not on file  Other Topics Concern  . Not on file  Social History Narrative   Lives with husband and three kids in two story home      Right handed      Highest level of edu- bachelors   Past Surgical History:  Procedure Laterality Date  . DILATION AND CURETTAGE OF UTERUS     Past Medical History:  Diagnosis Date  . Anxiety   . Depression   .  ICH (intracerebral hemorrhage) ([place])   . Seizures ([place])   . Stroke ([facility])    BP 101/66   Pulse 79   Temp 97.9 F (36.6 C)   Ht 5\' 3"  (1.6 m)   Wt 122 lb (55.3 kg)   SpO2 95%   BMI 21.61 kg/m   Opioid Risk Score:   Fall Risk Score:  `1  Depression screen PHQ 2/9  Depression screen [facility] 2/9 03/25/2019 08/20/2017 05/04/2017  Decreased Interest 1 1 3   Down, Depressed, Hopeless 1 1 3   PHQ - 2 Score 2 2 6   Some recent data might be hidden    Review of Systems  Constitutional: Negative.   HENT: Negative.   Eyes: Negative.   Respiratory: Negative.   Cardiovascular: Negative.   Gastrointestinal: Negative.   Endocrine: Negative.   Genitourinary: Negative.   Musculoskeletal: Negative.   Skin: Negative.   Allergic/Immunologic: Negative.   Neurological: Negative.   Hematological: Negative.   Psychiatric/Behavioral: Positive for dysphoric mood. The patient is  nervous/anxious.   All other systems reviewed and are negative.      Objective:   Physical Exam Vitals signs and nursing note reviewed.  Constitutional:      Appearance: Normal appearance.  Eyes:     Extraocular Movements: Extraocular movements intact.     Conjunctiva/sclera: Conjunctivae normal.     Pupils: Pupils are equal, round, and reactive to light.  Neurological:     General: No focal deficit present.     Mental Status: She is alert and oriented to person, place, and time.     Cranial Nerves: No dysarthria or facial asymmetry.     Sensory: Sensory deficit present.     Motor: Abnormal muscle tone present.     Coordination: Heel to [facility] Test abnormal. Impaired rapid alternating movements.     Gait: Gait abnormal.     Comments: Reduced sensation in the right foot both light touch and proprioception  Tone MAS 3 in foot inverters MAS 3 in toe flexors digits 2 through 5 MAS 2 in the great toe flexor MAS 3/4 in the gastrosoleus  Motor strength is 4/5 in the right hip flexor knee extensor 3 - ankle dorsiflexor this is opposed by strong plantar flexors tone  Patient ambulates without assistive device she walks with a right AFO she clears her foot adequately she does have decreased knee extension with cocontraction of the quad and hamstring on the right side.  Psychiatric:        Mood and Affect: Mood normal.        Behavior: Behavior normal.           Assessment & Plan:  #1.  Right spastic hemiplegia mainly affecting right lower limb.  Has done better with Xeomin then with Botox may be related to some resistance to Botox given multiple injections over the years. We will continue with current dose of Xeomin and repeat in 6 weeks We discussed slowing down, taking her time ambulating especially outdoors.  She states she will have a hard time doing this

## 2019-09-23 DIAGNOSIS — S0191XA Laceration without foreign body of unspecified part of head, initial encounter: Secondary | ICD-10-CM | POA: Diagnosis not present

## 2019-09-23 DIAGNOSIS — W1849XA Other slipping, tripping and stumbling without falling, initial encounter: Secondary | ICD-10-CM | POA: Diagnosis not present

## 2019-09-23 DIAGNOSIS — B349 Viral infection, unspecified: Secondary | ICD-10-CM | POA: Diagnosis not present

## 2019-09-26 DIAGNOSIS — S0181XS Laceration without foreign body of other part of head, sequela: Secondary | ICD-10-CM | POA: Diagnosis not present

## 2019-09-26 DIAGNOSIS — W1849XA Other slipping, tripping and stumbling without falling, initial encounter: Secondary | ICD-10-CM | POA: Diagnosis not present

## 2019-09-26 DIAGNOSIS — Z4802 Encounter for removal of sutures: Secondary | ICD-10-CM | POA: Diagnosis not present

## 2019-10-04 ENCOUNTER — Ambulatory Visit: Payer: Medicare Other | Attending: Internal Medicine

## 2019-10-04 DIAGNOSIS — Z20822 Contact with and (suspected) exposure to covid-19: Secondary | ICD-10-CM

## 2019-10-06 LAB — NOVEL CORONAVIRUS, NAA: SARS-CoV-2, NAA: NOT DETECTED

## 2019-10-19 ENCOUNTER — Ambulatory Visit: Payer: Medicare Other | Admitting: Occupational Therapy

## 2019-10-26 ENCOUNTER — Encounter: Payer: Self-pay | Admitting: Occupational Therapy

## 2019-10-26 ENCOUNTER — Other Ambulatory Visit: Payer: Self-pay

## 2019-10-26 ENCOUNTER — Ambulatory Visit: Payer: Medicare Other | Attending: Physical Medicine & Rehabilitation | Admitting: Occupational Therapy

## 2019-10-26 DIAGNOSIS — I69851 Hemiplegia and hemiparesis following other cerebrovascular disease affecting right dominant side: Secondary | ICD-10-CM

## 2019-10-26 DIAGNOSIS — R2681 Unsteadiness on feet: Secondary | ICD-10-CM | POA: Diagnosis not present

## 2019-10-26 DIAGNOSIS — M25671 Stiffness of right ankle, not elsewhere classified: Secondary | ICD-10-CM | POA: Diagnosis not present

## 2019-10-26 DIAGNOSIS — M6281 Muscle weakness (generalized): Secondary | ICD-10-CM | POA: Diagnosis not present

## 2019-10-26 DIAGNOSIS — F3342 Major depressive disorder, recurrent, in full remission: Secondary | ICD-10-CM | POA: Diagnosis not present

## 2019-10-26 NOTE — Therapy (Signed)
[facility] [address] [place], [place], [address] Phone: [phone]   Fax:  [phone]  Occupational Therapy Treatment  Patient Details  Name: Ashley [name] MRN: [id number] Date of Birth: 1976/06/29 Referring Provider (OT): Dr Marton Redwood   Encounter Date: 10/26/2019  OT End of Session - 10/26/19 1940    Visit Number  18    Number of Visits  20    Date for OT Re-Evaluation  10/26/19   renewed today for this session; pt returns for first time since last session due to scheduling issues, holiday vacation and medical issues.   Authorization Type  medicare with [employer] - will need PN every 10th visit    Authorization - Visit Number  18    Authorization - Number of Visits  20    OT Start Time  1502    OT Stop Time  1551    OT Time Calculation (min)  49 min    Activity Tolerance  Patient tolerated treatment well       Past Medical History:  Diagnosis Date  . Anxiety   . Depression   . ICH (intracerebral hemorrhage) ([place])   . Seizures ([place])   . Stroke [facility])     Past Surgical History:  Procedure Laterality Date  . DILATION AND CURETTAGE OF UTERUS      There were no vitals filed for this visit.  Subjective Assessment - 10/26/19 1934    Subjective   I can't believe how much progress I made - I was terrified of the water before I started    Pertinent History  see epic snapshot; pt with [facility] 2 weeks after deliverying her 3rd child    Patient Stated Goals  strength of arm and get my R leg working better    Currently in Pain?  No/denies         Patient seen for aquatic therapy today.  Treatment took place in water 2.5-4 feet deep depending upon activity.  Pt entered the pool via steps using 1 hand railing and mod I.  Session focused on completion of education of aquatic HEP including activities related to trunk control, postural alignment and control, dynamic standing balance, UE and LE strengthening and  transitional movements.  After instruction and practice pt able to return demonstrate all activities mod I.  Pt reports she is very pleased with her progress and now feels very comfortable and safe in the water (pt was initially very fearful). Pt reports that her family boats and swims and she now feels comfortable enough in the water to join them with a safety belt.  Pt exited the pool via steps using 1 hand railing and mod I.  Pt has met all goals is now ready for discharge. Pt in agreement.                    OT Education - 10/26/19 1934    Education Details  completed education on aquatic HEP.    Person(s) Educated  Patient    Methods  Explanation;Demonstration;Handout;Verbal cues    Comprehension  Verbalized understanding;Returned demonstration       OT Short Term Goals - 10/26/19 1934      OT SHORT TERM GOAL #1   Title  Pt will demonstrate improved overhead reach to at least 130* of shoulder flexion RUE  to assist with heavier overhead home mgmt tasks -7/8//2020 (goal date adjusted to meet 4 visits)    Status  Achieved  OT SHORT TERM GOAL #2   Title  Pt will demonstrate improved RUE strength /core strength to assist with lifting heavy items in grocery store with greater ease (per pt report)    Status  Achieved      OT SHORT TERM GOAL #3   Title  Pt will demonstrate ability to lift and carry a 5 pound object in the grocery store with her RUE with no LOB - 06/22/2019 (date adjusted due to hold first two weeks in August due to scheduling)    Status  Achieved        OT Long Term Goals - 10/26/19 1935      OT LONG TERM GOAL #1   Title  Pt will be mod I with upgraded HEP to include aquatic based HEP -10/26/2019 (renewed today as this is first session pt has been able to attend since 09/01/19)    Status  Achieved      OT LONG TERM GOAL #2   Title  Pt will demonstrate improved postural alignment and control to reduce falls (has been falling at least 1x/month)     Status  Achieved   pt reports she has not fallen in almost three months           Plan - 10/26/19 1938    Clinical Impression Statement  Pt returns for first time since 09/01/2019 due to scheduling issues, holiday vacation and medical issues.  Pt returns for today's session to complete education for aquatic HEP.  All education completed and pt is ready for discharge.    OT Occupational Profile and History  Problem Focused Assessment - Including review of records relating to presenting problem    Occupational performance deficits (Please refer to evaluation for details):  IADL's;Leisure;Social Participation    Body Structure / Function / Physical Skills  Balance;Mobility;UE functional use;Strength;IADL;Tone;ROM;GMC    Rehab Potential  Good    Clinical Decision Making  Limited treatment options, no task modification necessary    Comorbidities Affecting Occupational Performance:  May have comorbidities impacting occupational performance    Modification or Assistance to Complete Evaluation   No modification of tasks or assist necessary to complete eval    OT Frequency  1x / week    OT Duration  2 weeks    OT Treatment/Interventions  Aquatic Therapy;Self-care/ADL training;Neuromuscular education;Therapeutic exercise;Functional Mobility Training;Balance training    Plan  completed aquatic HEP education today and pt now ready for discharge    Consulted and Agree with Plan of Care  Patient       Patient will benefit from skilled therapeutic intervention in order to improve the following deficits and impairments:   Body Structure / Function / Physical Skills: Balance, Mobility, UE functional use, Strength, IADL, Tone, ROM, GMC       Visit Diagnosis: Unsteadiness on feet - Plan: Ot plan of care cert/re-cert  Muscle weakness (generalized) - Plan: Ot plan of care cert/re-cert  Stiffness of right ankle, not elsewhere classified - Plan: Ot plan of care cert/re-cert  Spastic hemiplegia of  right dominant side as late effect of other cerebrovascular disease ([place]) - Plan: Ot plan of care cert/re-cert    Problem List Patient Active Problem List   Diagnosis Date Noted  . Depression 03/23/2017  . Anxiety 03/23/2017  . Muscle weakness 02/03/2017  . History of hemorrhagic stroke with residual hemiparesis ([place]) 12/10/2016  . Spastic hemiplegia and hemiparesis affecting dominant side ([place]) 09/13/2015  . Adhesive capsulitis of right shoulder 08/24/2015  . Localization-related  symptomatic epilepsy and epileptic syndromes with simple partial seizures, not intractable, without status epilepticus ([place]) 08/21/2015  . Nontraumatic cortical hemorrhage of cerebral hemisphere ([place])   . Seizure ([place])   . Seizures ([place])   . Adjustment disorder with depressed mood   . Contracture of muscle ankle and foot 06/11/2015  . Hemiplga fol ntrm intcrbl hemor aff right dominant side ([place]) 06/06/2015  . Left-sided intracerebral hemorrhage ([place]) 06/04/2015  . Seizure disorder as sequela of cerebrovascular accident ([place])   . Seizure disorder ([place]) 06/03/2015  . Sepsis ([place]) 06/03/2015  . UTI (urinary tract infection) 06/03/2015  . Hypotension 06/02/2015  . Right spastic hemiparesis ([place]) 05/28/2015  . Aphasia following nontraumatic intracerebral hemorrhage 05/28/2015  . History of anxiety disorder 05/28/2015  . ICH (intracerebral hemorrhage) ([place])   . Seizure disorder, nonconvulsive, with status epilepticus ([place])   . Cerebral venous thrombosis of cortical vein   . Cytotoxic cerebral edema ([place])   . IVH (intraventricular hemorrhage) ([place])   . Term pregnancy 05/07/2015  . Spontaneous vaginal delivery 05/07/2015    Quay Burow, OTR/L 10/26/2019, 7:46 PM  [facility] [address] [place], [place], [address] Phone: [phone]   Fax:  [phone]  Name: Ashley [name] MRN: [id number] Date of Birth: 12/18/1975

## 2019-10-27 ENCOUNTER — Other Ambulatory Visit: Payer: Self-pay

## 2019-10-27 DIAGNOSIS — G40109 Localization-related (focal) (partial) symptomatic epilepsy and epileptic syndromes with simple partial seizures, not intractable, without status epilepticus: Secondary | ICD-10-CM

## 2019-10-27 MED ORDER — LEVETIRACETAM 750 MG PO TABS: 1500.0000 mg | ORAL_TABLET | Freq: Two times a day (BID) | ORAL | 3 refills | Status: DC

## 2019-11-01 ENCOUNTER — Encounter: Payer: Medicare Other | Attending: Physical Medicine & Rehabilitation | Admitting: Physical Medicine & Rehabilitation

## 2019-11-01 DIAGNOSIS — G8110 Spastic hemiplegia affecting unspecified side: Secondary | ICD-10-CM | POA: Insufficient documentation

## 2019-11-15 ENCOUNTER — Other Ambulatory Visit: Payer: Self-pay

## 2019-11-15 ENCOUNTER — Encounter: Payer: Self-pay | Admitting: Physical Medicine & Rehabilitation

## 2019-11-15 ENCOUNTER — Encounter: Payer: Medicare Other | Attending: Physical Medicine & Rehabilitation | Admitting: Physical Medicine & Rehabilitation

## 2019-11-15 VITALS — BP 101/52 | HR 80 | Temp 97.5°F | Ht 63.0 in | Wt 127.0 lb

## 2019-11-15 DIAGNOSIS — G8110 Spastic hemiplegia affecting unspecified side: Secondary | ICD-10-CM | POA: Insufficient documentation

## 2019-11-15 NOTE — Progress Notes (Signed)
Xeomin Injection for spasticity using needle EMG guidance  Dilution: 50 Units/ml Indication: Severe spasticity which interferes with ADL,mobility and/or  hygiene and is unresponsive to medication management and other conservative care Informed consent was obtained after describing risks and benefits of the procedure with the patient. This includes bleeding, bruising, infectio Post tib 75U Gastroc 150U FDL 100 FHL 75 All injections were done after obtaining appropriate EMG activity and after negative drawback for blood. The patient tolerated the procedure well. Post procedure instructions were given. A followup appointment was made.

## 2019-11-15 NOTE — Patient Instructions (Signed)
You received a Xeomin injection today. You may experience soreness at the needle injection sites. Please call us if any of the injection sites turns red after a couple days or if there is any drainage. You may experience muscle weakness as a result of Botox. This would improve with time but can take several weeks to improve.  The injection can be repeated every 3 months as needed.

## 2019-11-25 ENCOUNTER — Other Ambulatory Visit: Payer: Medicare Other

## 2019-12-20 ENCOUNTER — Other Ambulatory Visit: Payer: Self-pay

## 2019-12-20 MED ORDER — LORAZEPAM 1 MG PO TABS: ORAL_TABLET | ORAL | 5 refills | Status: DC

## 2019-12-27 ENCOUNTER — Ambulatory Visit: Payer: Medicare Other | Admitting: Psychology

## 2019-12-27 ENCOUNTER — Encounter: Payer: Self-pay | Admitting: Psychology

## 2019-12-27 ENCOUNTER — Other Ambulatory Visit: Payer: Self-pay

## 2019-12-27 ENCOUNTER — Ambulatory Visit (INDEPENDENT_AMBULATORY_CARE_PROVIDER_SITE_OTHER): Payer: Medicare Other | Admitting: Psychology

## 2019-12-27 DIAGNOSIS — F411 Generalized anxiety disorder: Secondary | ICD-10-CM

## 2019-12-27 DIAGNOSIS — I69398 Other sequelae of cerebral infarction: Secondary | ICD-10-CM

## 2019-12-27 DIAGNOSIS — I612 Nontraumatic intracerebral hemorrhage in hemisphere, unspecified: Secondary | ICD-10-CM | POA: Diagnosis not present

## 2019-12-27 DIAGNOSIS — G40909 Epilepsy, unspecified, not intractable, without status epilepticus: Secondary | ICD-10-CM | POA: Diagnosis not present

## 2019-12-27 DIAGNOSIS — R413 Other amnesia: Secondary | ICD-10-CM

## 2019-12-27 DIAGNOSIS — F332 Major depressive disorder, recurrent severe without psychotic features: Secondary | ICD-10-CM | POA: Diagnosis not present

## 2019-12-27 DIAGNOSIS — I611 Nontraumatic intracerebral hemorrhage in hemisphere, cortical: Secondary | ICD-10-CM | POA: Diagnosis not present

## 2019-12-27 NOTE — Progress Notes (Signed)
   Psychometrician Note   Cognitive testing was administered to Ashley [name] by Milana Kidney, B.S. (psychometrist) under the supervision of Dr. Christia Reading, Ph.D., licensed psychologist. Ashley [name] did not appear overtly distressed by the testing session per behavioral observation or responses across self-report questionnaires. Dr. Christia Reading, Ph.D. checked in with Ashley [name] as needed to manage any distress related to testing procedures (if applicable). Rest breaks were offered.    The battery of tests administered was selected by Dr. Christia Reading, Ph.D. with consideration to Ashley [name]'s current level of functioning, the nature of her symptoms, emotional and behavioral responses during interview, level of literacy, observed level of motivation/effort, and the nature of the referral question. This battery was communicated to the psychometrist. Communication between Dr. Christia Reading, Ph.D. and the psychometrist was ongoing throughout the evaluation and Dr. Christia Reading, Ph.D. was immediately accessible at all times. Dr. Christia Reading, Ph.D. provided supervision to the psychometrist on the date of this service to the extent necessary to assure the quality of all services provided.    Ashley [name] will return within approximately 1-2 weeks for an interactive feedback session with Dr. Melvyn Novas at which time her test performances, clinical impressions, and treatment recommendations will be reviewed in detail. Ashley [name] understands she can contact our office should she require our assistance before this time.  A total of 165 minutes of billable time were spent face-to-face with Ashley [name] by the psychometrist. This includes both test administration and scoring time. Billing for these services is reflected in the clinical report generated by Dr. Christia Reading, Ph.D..  This note reflects time spent with the psychometrician and does not include test scores or any clinical  interpretations made by Dr. Melvyn Novas. The full report will follow in a separate note.

## 2019-12-27 NOTE — Progress Notes (Addendum)
NEUROPSYCHOLOGICAL EVALUATION [place] [place] Department of Neurology  Reason for Referral:   Ashley [name] is a 44 y.o. right-handed Caucasian female referred by Ashley [name], M.D., to characterize her current cognitive functioning and assist with diagnostic clarity and treatment planning in the context of subjective cognitive decline and a history of IVH, SAH, and recurrent seizure activity.  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 The parietal lobe is implicated in a variety of sensory and cognitive processes, including more advanced perception of auditory and visual information, while ACA territory hemorrhages/infarctions commonly result in executive dysfunction. Hyperintensities seen across brain MRI in the hippocampus are implicated in memory dysfunction, with the left hippocampus being associated with greater verbal memory dysfunction assuming normal lateralization of this right-handed individual. Results of cognitive testing are largely consistent with anatomical areas of interest.  For example, Ashley [name] exhibited significant impairments learning and later recalling previously learned verbal information. This, relative to intact visual learning and memory, is consistent with abnormalities in the left hippocampus as seen on imaging. Additional weaknesses were exhibited across tasks assessing visuoperceptual executive dysfunction, including pattern recognition and adaptation to changing demands (WCST) and cognitive flexibility, both visuomotor (TMT B) and verbally mediated (D-KEFS Verbal Fluency) in nature. 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 Bilateral motor impairments were not lateralizing, but likely further suggest increased  frontal lobe dysfunction. Semantic fluency also represented a normative weakness and is perhaps more consistent with posterior left hemispheric dysfunction.  Overall, Ashley [name] denied difficulties with instrumental activities of daily living (ADLs). As such, she meets criteria for a Mild Neurocognitive Disorder (formerly "mild cognitive impairment") at the present time. The etiology is likely mixed, with the primary culprit being her history of IVH and subsequent obstructive hydrocephalus and subarachnoid hemorrhage. Seizure activity secondary to the aforementioned conditions is also a likely contributor to her clinical presentation. In addition, Ashley [name] reported moderate levels of anxiety and severe rates of depression over the past 1-2 weeks. Severe psychiatric distress can certainly influence cognitive efficiency and is also likely contributing to the presence of cognitive deficits. However, deficits are believed to be above and beyond the contribution of mood-related factors by themselves. Continued medical monitoring moving forward will be important.   Recommendations: A combination of medication and psychotherapy has been shown to be most effective at treating symptoms of anxiety and depression. As such, Ashley [name] is encouraged to speak with her prescribing physician regarding medication adjustments to optimally manage these symptoms. Likewise, Ashley [name] is encouraged re-engage in short-term psychotherapy to address symptoms of psychiatric distress as this represents the only way to address the underlying causes of these symptoms. She would benefit from an active and collaborative therapeutic environment, rather than one purely supportive in nature. Recommended treatment modalities include Cognitive Behavioral Therapy (CBT) or Acceptance and Commitment Therapy (ACT).  Ashley [name] husband noted that she consumes "a couple glasses of wine each night." The CDC defines "heavy drinking" for women  as consuming 8 or more drinks per week. Based on this report, Ashley [name] would likely fall within this category. As such, she is encouraged to decrease alcohol consumption as heavy drinking has been linked to many health complications, as well as cognitive dysfunction.   If interested, there are some activities which have therapeutic value and can be useful in keeping  her cognitively stimulated. For suggestions, Ashley [name] is encouraged to go to the following website: https://www.[facility].org/get-to-know-barrow/centers-programs/neurorehabilitation-center/neuro-rehab-apps-and-games/ which has options, categorized by level of difficulty. It should be noted that these activities should not be viewed as a substitute for therapy.  When learning new information, she would benefit from information being broken up into small, manageable pieces. She may also find it helpful to articulate the material in her own words and in a context to promote encoding at the onset of a new task. This material may need to be repeated multiple times to promote encoding. Ashley [name] should also capitalize on visual memory strengths when organizing information which is important to be recalled later.   Memory can be improved using internal strategies such as rehearsal, repetition, chunking, mnemonics, association, and imagery. External strategies such as written notes in a consistently used memory journal, visual and nonverbal auditory cues such as a calendar on the refrigerator or appointments with alarm, such as on a cell phone, can also help maximize recall.    To address problems with executive dysfunction, she may wish to consider:   -Avoiding external distractions when needing to concentrate   -Limiting exposure to fast paced environments with multiple sensory demands   -Writing down complicated information and using checklists   -Attempting and completing one task at a time (i.e., no multi-tasking)   -Verbalizing aloud  each step of a task to maintain focus   -Reducing the amount of information considered at one time  Review of Records:   Ashley [name] was seen by [facility] Ashley [name], M.D.) on 08/10/2019 for a significant increase in seizure activity that appeared to occur in the setting of Lamotrigine toxicity (she inadvertently took 400mg  BID instead of prescribed 200mg  BID). Briefly, Ms. Labelle experienced a severe headache for several days in August 2016 two weeks post-partum. She then developed right-sided weakness, aphasia, and experienced a focal seizure while in the hospital. She was found to have a large left parietal ICH with IVH, cerebral edema, obstructive hydrocephalus, and subarachnoid hemorrhage. MRA and MRV were unremarkable. Concern was for cerebral cortical vein thrombosis not seen on MRV, however RCVS was also a consideration. She was treated with hypertonic saline and stabilized. She had a seizure with EEG showing left PLEDs. She was started on Keppra. Hypercoagulable labs were normal. She was discharged to rehab and had another seizure on 06/01/2016 with report of eyes rolling back and upper extremities shaking. Keppra dose was increased. She had a repeat MRI brain in September 2016 with resolving subacute left parietal hematoma. There was acute gyral swelling and T2 hyperintensity in the left hippocampal formation, left insula, and operculum which resolved (exhibited by repeat MRI). She was initially having some headaches and low dose Topamax was briefly added. With resolution of headaches, this was tapered off. She had been dealing with post-stroke depression and symptoms had improved; however with limitations on driving again, she reported starting to feel depressed once more.  On 11/27/2016, she started having speech difficulties where she was making paraphasic errors. This continued for 30 minutes and she went to the ER. In the ER, she was noted to be aphasic. As she was having a head CT,  she suddenly had right gaze and head deviation, right arm and leg flexion, and shaking for around 3 minutes. She was given IV Ativan but her right leg continued to shake in a rhythmic fashion. She was given additional Keppra and started on Vimpat with no further seizures. She was back to  baseline in a couple of hours. She initially had some difficulties with the Vimpat 100mg  BID where she had hallucinations of hearing music or voices but these resolved. She denies any side effects on Keppra 1500mg  BID. She denies any dizziness, diplopia, or gait instability. She has a right foot drop. She denies any numbness/tingling on the right side but states it feels different than the other side. She denies any olfactory/gustatory hallucinations, myoclonic jerks, episodes of staring/unresponsiveness, or gaps in time. She has not had any further episodes of paraphasic errors since her hospitalization. She had a mild diffuse headache for a couple of days but this has resolved. She denies any dizziness, dysarthria/dysphagia, neck/back pain, bowel/bladder dysfunction.  During her most recent appointment with Dr. Delice Lesch, Ms. Aurand reported ongoing short-term memory difficulties. Performance on a brief cognitive screening instrument (SLUMS) was 23/30. Ultimately, Ms. Buske was referred for a comprehensive neuropsychological evaluation to characterize her cognitive abilities and to assist with diagnostic clarity and treatment planning.   Past Medical History:  Diagnosis Date  . 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 Cerebral venous thrombosis of cortical vein   . 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 Cytotoxic cerebral edema   . Generalized anxiety disorder 05/28/2015  . Hypotension 06/02/2015  . ICH (intracerebral hemorrhage)   . IVH (intraventricular hemorrhage)   . Left-sided intracerebral hemorrhage 06/04/2015  . Major  depressive disorder 03/23/2017  . Right spastic hemiparesis 05/28/2015   Left ICH  . Seizure disorder, nonconvulsive, with status epilepticus   . Sepsis 06/03/2015  . UTI (urinary tract infection) 06/03/2015    Past Surgical History:  Procedure Laterality Date  . DILATION AND CURETTAGE OF UTERUS      Current Outpatient Medications:  .  aspirin 81 MG chewable tablet, Chew 81 mg by mouth daily., Disp: , Rfl:  .  Cholecalciferol (VITAMIN D) 2000 units CAPS, Take 2,000 Units by mouth daily., Disp: , Rfl:  .  FLUoxetine (PROZAC) 20 MG capsule, Take 1 capsule (20 mg total) by mouth daily. Take with 40 mg for total of 60 mg daily, Disp: 90 capsule, Rfl: 1 .  FLUoxetine (PROZAC) 40 MG capsule, Take 1 capsule (40 mg total) by mouth daily., Disp: 90 capsule, Rfl: 0 .  lamoTRIgine (LAMICTAL) 200 MG tablet, Take 1 tablet (200 mg total) by mouth 2 (two) times daily. Take 200 mg BID, Disp: 180 tablet, Rfl: 3 .  levETIRAcetam (KEPPRA) 750 MG tablet, Take 2 tablets (1,500 mg total) by mouth 2 (two) times daily., Disp: 360 tablet, Rfl: 3 .  linaclotide (LINZESS) 145 MCG CAPS capsule, Take 145 mcg by mouth daily before breakfast., Disp: , Rfl:  .  LORazepam (ATIVAN) 1 MG tablet, Take 1 tablet as needed for seizures, Disp: 10 tablet, Rfl: 5 .  magnesium oxide (MAG-OX) 400 MG tablet, Take 400 mg by mouth 2 (two) times daily., Disp: , Rfl:  .  mirtazapine (REMERON) 30 MG tablet, Take 1 tablet (30 mg total) by mouth at bedtime., Disp: 90 tablet, Rfl: 0 .  Multiple Vitamin (MULTIVITAMIN) tablet, Take 1 tablet by mouth daily. , Disp: , Rfl:   Clinical Interview:   Cognitive Symptoms: Decreased short-term memory: Endorsed. Examples were said to include difficulties remembering the details of previous conversations, trouble remembering the names of familiar individuals, misplacing items around the home, and losing her train of thought.  Decreased long-term memory: Denied. Decreased attention/concentration:  Endorsed. Her husband noted instances where she appears to be hyperfocused and has trouble prioritizing tasks based on their perceived importance. She agreed with this assessment, while also noting some trouble maintaining her focus at times.  Reduced processing speed: Endorsed. She also described instances where she will commonly question herself and her abilities.  Difficulties with executive functions: Endorsed. Difficulties were said to surround complex planning and multi-tasking. Trouble with organization and impulsivity were denied. Personality changes in the form of her being more irritable and easily angered were reported.  Difficulties with emotion regulation: Denied outside what is described above. Difficulties with receptive language: Endorsed. Difficulties were at least partially attributed to attentional dysfunction.  Difficulties with word finding: Denied. Decreased visuoperceptual ability: Denied.  Trajectory of deficits: Cognitive deficits emerged following her IVH in 2016. These deficits were said to have progressively worsened over time, especially surrounding memory. Her husband also noted potential mood contributions to these deficits, particularly surrounding anxiety.   Difficulties completing ADLs: Denied.  Additional Medical History: History of traumatic brain injury/concussion: Denied. History of stroke: Endorsed (see above). History of seizure activity: Endorsed (see above). Outside of seizure activity brought on by accidentally taking too much medication in the fall of 2020, her most recent seizure was said to have occurred 8-12 months ago. History of known exposure to toxins: Denied. Symptoms of chronic pain: Denied. Experience of frequent headaches/migraines: She reported experiencing frontal headaches "once in a while." These are effectively treated with OTC medications.  Frequent instances of dizziness/vertigo: Denied.  Sensory changes: In the last year, she  reported worsening symptoms of farsightedness, largely attributed to age. She also reported subtle hearing loss. Changes/difficulties in taste or smell were denied.  Balance/coordination difficulties: Endorsed. She has a right foot drop stemming from her IVH and noted ongoing weakness in the right side of her body (lower extremities worse than upper). She estimated experiencing 1-2 falls per month, with several resulting in serious orthopedic injuries. These symptoms were said to have improved lately.  Other motor difficulties: Denied.  Sleep History: Estimated hours obtained each night: At least 8 hours. Difficulties falling asleep: Denied with the assistance of medications.  Difficulties staying asleep: Denied. Feels rested and refreshed upon awakening: Endorsed.  History of snoring: Denied. History of waking up gasping for air: Denied. Witnessed breath cessation while asleep: Denied.  History of vivid dreaming: Denied. Excessive movement while asleep: Denied. Instances of acting out her dreams: Denied.  Psychiatric/Behavioral Health History: Depression: Endorsed. She reported an episode of depressive symptoms in childhood during her parent's divorce. Symptoms have been more prevalent since her IVH and subsequent medical conditions. She reported being easily angered or upset and frequent crying spells. She acknowledged some passive suicidal ideation (i.e., "I don't want to live anymore"), as recent as a few months prior to the current evaluation, as well as immediately following her IVH. Her children serve as her primary protective factor and she denied ever having a plan or intent to act on these thoughts. She currently takes anti-depressant medications but stated her belief that they are not currently working. She previously worked with an Haematologist and described this process as helpful. It was stopped due to the ongoing COVID-19 pandemic and her being uncomfortable continuing these  sessions in a virtual format.  Anxiety: Endorsed. While subtle to mild symptoms may have been present prior to her IVH, they were described as far more pronounced currently. Symptoms were said to be generalized in nature and she denied a history of  panic attacks.  Mania: Denied. Trauma History: Denied. Visual/auditory hallucinations: Denied. Delusional thoughts: Denied.  Tobacco: Denied. Alcohol: Her husband stated that she consumes "a couple glasses of wine each night." She denied a history of problematic alcohol abuse or dependence.  Recreational drugs: Denied. Caffeine: 1-2 cups of coffee per day.  Family History: Problem Relation Age of Onset  . Cancer Mother   . Cancer Father        pancreatic   This information was confirmed by Ms. Barnet Glasgow.  Academic/Vocational History: Highest level of educational attainment: 16 years. Ms. Gonser graduated from high school and went on to earn a Water quality scientist degree in business administration. She described herself as a straight A student and denied any relative academic weaknesses.  History of developmental delay: Denied. History of grade repetition: Denied. Enrollment in special education courses: Denied. History of LD/ADHD: Denied.  Employment: Unemployed. She has been receiving disability benefits since her IVH and associated illnesses. 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   Evaluation Results:   Behavioral Observations: Ms. Whitehorse was accompanied by her husband, arrived to her appointment on time, and was appropriately dressed and groomed. Observed gait and station exhibited very mild instability at times. Gross motor functioning appeared intact upon informal observation and no abnormal movements (e.g., tremors) were noted. Her affect was somewhat depressed and she did become tearful while discussing depressive symptoms and the presence of passive suicidal ideation. Spontaneous speech was  fluent and word finding difficulties were not observed. Thought processes were coherent, organized, and normal in content. Insight into her cognitive difficulties appeared appropriate. During testing, sustained attention was appropriate. Task engagement was adequate and she persisted when challenged. She was noted to frequently lose focus/her train of thought during verbal fluency tasks. Overall, Ms. Kasprzak was cooperative with the clinical interview and subsequent testing procedures.   Adequacy of Effort: The validity of neuropsychological testing is limited by the extent to which the individual being tested may be assumed to have exerted adequate effort during testing. Ms. Minish expressed her intention to perform to the best of her abilities and exhibited adequate task engagement and persistence. Scores across stand-alone and embedded performance validity measures were within expectation. As such, the results of the current evaluation are believed to be a valid representation of Ms. Horrell's current cognitive functioning.  Test Results: Ms. Bonifield was largely oriented at the time of the current evaluation. A point was lost for her stating her incorrect age ("17").   Intellectual abilities based upon educational and vocational attainment were estimated to be in the average range. Premorbid abilities were estimated to be within the average range based upon a single-word reading test.   Processing speed was below average to above average. Basic attention was average. More complex attention (e.g., working memory) was average across a verbally mediated task and well below average across a visually mediated task. Executive functioning was variable. Relative weaknesses surrounded adaptability and pattern recognition, visually mediated cognitive flexibility, and abstract reasoning. Verbally mediated cognitive flexibility and hypothesis testing/problem solving were appropriate.   Assessed receptive  language abilities were average. Likewise, Ms. Dorenkamp did not exhibit any difficulties comprehending task instructions and answered all questions asked of her appropriately. Assessed expressive language (e.g., verbal fluency and confrontation naming) was variable. Phonemic fluency was average to above average, semantic fluency was well below average, and confrontation naming was below average.      Assessed visuospatial/visuoconstructional abilities were average to above average.  Learning (i.e., encoding) of novel verbal information was exceptionally low, while learning visual information was average. Spontaneous delayed recall (i.e., retrieval) of previously learned information was commensurate with performance across initial learning trials. Retention rates were 43% across a story learning task, 60% across a list learning task, and 100% across a shape learning task. Performance across recognition tasks was also commensurate with performance across learning and delayed recall trials, suggesting evidence for information consolidation primarily across a visual measure.  Simple motor speed was well below average when using her dominant (right) hand and exceptionally low when using her non-dominant (left) hand. Fine motor coordination was bilaterally exceptionally low.   Results of emotional screening instruments suggested that recent symptoms of generalized anxiety were in the moderate range, while symptoms of depression were within the severe range. A screening instrument assessing recent sleep quality suggested the presence of minimal sleep dysfunction.  Tables of Scores:   Note: This summary of test scores accompanies the interpretive report and should not be considered in isolation without reference to the appropriate sections in the text. Descriptors are based on appropriate normative data and may be adjusted based on clinical judgment. The terms "impaired" and "within normal limits (WNL)" are  used when a more specific level of functioning cannot be determined.       Effort Testing:   DESCRIPTOR       ACS Word Choice: --- --- Within Expectation  HVLT-R Recognition Discrimination Index: --- --- Within Expectation  BVMT-R Retention Percentage: --- --- Within Expectation       Orientation:      Raw Score Percentile   NAB Orientation, Form 1 28/29 --- ---       Intellectual Functioning:           Standard Score Percentile   Test of Premorbid Functioning: [id number] Average       Memory:          Wechsler Memory Scale (WMS-IV):                       Raw Score (Scaled Score) Percentile     Logical Memory I 7/50 (1) <1 Exceptionally Low    Logical Memory II 3/50 (1) <1 Exceptionally Low    Logical Memory Recognition 19/30 3-9 Well Below Average       Hopkins Verbal Learning Test (HVLT-R), Form 1: Raw Score (T Score) Percentile     Total Trials 1-3 10/36 (6) <1 Exceptionally Low    Delayed Recall 3/12 (15) <1 Exceptionally Low    Recognition Discrimination Index 6 (9) <1 Exceptionally Low      True Positives 10 --- ---      False Positives 4 --- ---        Brief Visuospatial Memory Test (BVMT-R), Form 1: Raw Score (T Score) Percentile     Total Trials 1-3 24/36 (48) 42 Average    Delayed Recall 10/12 (53) 62 Average    Recognition Discrimination Index 6 >16 Within Normal Limits      Recognition Hits 6/6 >16 Within Normal Limits      False Positive Errors 0 >16 Within Normal Limits        Attention/Executive Function:          Trail Making Test (TMT): Raw Score (T Score) Percentile     Part A 30 secs.,  0 errors (41) 18 Below Average    Part B 88 secs.,  1 error (36) 8 Well Below Average  Symbol Digit Modalities Test (SDMT): Raw Score (Z-Score) Percentile     Written 39 (-1.36) 9 Below Average    Oral 47 (-1.23) 11 Below Average       NAB Attention Module, Form 1: T Score Percentile     Digits Forward 47 38 Average    Digits Backwards 43 25 Average         Age-Scaled Score Percentile   WMS-IV Symbol Span: 5 5 Well Below Average        Scaled Score Percentile   WAIS-IV Similarities: 7 16 Below Average       D-KEFS Color-Word Interference Test: Raw Score (Scaled Score) Percentile     Color Naming 29 secs. (10) 50 Average    Word Reading 20 secs. (11) 63 Average    Inhibition 62 secs. (8) 25 Average      Total Errors 0 errors (12) 75 Above Average    Inhibition/Switching 77 secs. (7) 16 Below Average      Total Errors 7 errors (5) 5 Well Below Average       D-KEFS Verbal Fluency Test: Raw Score (Scaled Score) Percentile     Letter Total Correct 44 (12) 75 Above Average    Category Total Correct 26 (4) 2 Well Below Average    Category Switching Total Correct 5 (1) <1 Exceptionally Low    Category Switching Accuracy 4 (2) <1 Exceptionally Low      Total Set Loss Errors 9 (1) <1 Exceptionally Low      Total Repetition Errors 8 (3) 1 Exceptionally Low       D-KEFS Design Fluency Test: Raw Score (Scaled Score) Percentile     Condition 1 - Filled Dots 10 (10) 50 Average    Condition 2 - Empty Dots 13 (12) 75 Above Average    Condition 3 - Switching 6 (9) 37 Average      Total Set Loss Errors 5 (8) 25 Average      Total Repetition Errors 2 (12) 75 Above Average       D-KEFS 20 Questions Test: Scaled Score Percentile     Total Weighted Achievement Score 12 75 Above Average    Initial Abstraction Score 10 50 Average       [place] Card Sorting Test [facility]): Raw Score Percentile     Categories (trials) 1 (64) 6-10 Well Below Average    Total Errors 38 1 Exceptionally Low    Perseverative Errors 15 4 Well Below Average    Non-Perseverative Errors 23 1 Exceptionally Low    Failure to Maintain Set 1 --- ---       Language:          Verbal Fluency Test: Raw Score (T Score) Percentile     Phonemic Fluency (FAS) 44 (46) 34 Average    Animal Fluency 15 (33) 5 Well Below Average        NAB Language Module, Form 1: T Score Percentile      Auditory Comprehension 47 38 Average    Naming 29/31 (37) 9 Below Average       Visuospatial/Visuoconstruction:      Raw Score Percentile   Clock Drawing: 9/10 --- Within Normal Limits       NAB Spatial Module, Form 1: T Score Percentile     Figure Drawing Copy 57 75 Above Average    Figure Drawing Immediate Recall 42 21 Below Average        Scaled Score Percentile   WAIS-IV Block Design:  8 25 Average  WAIS-IV Matrix Reasoning: 8 25 Average       Sensory-Motor:          [place] Grooved Pegboard Test: Raw Score Percentile     Dominant Hand 109 secs.,  1 drop  <1 Exceptionally Low    Non-Dominant Hand 123 secs.,  0 drops  <1 Exceptionally Low       Finger Tapping Test: Mean Percentile     Dominant Hand 34 3 Well Below Average    Non-Dominant Hand 31 2 Exceptionally Low       Mood and Personality:      Raw Score Percentile   Beck Depression Inventory - II: 45 --- Severe  Beck Anxiety Inventory: 19 --- Moderate       Additional Questionnaires:      Raw Score Percentile   PROMIS Sleep Disturbance Questionnaire: 17 --- None to Slight   Informed Consent and Coding/Compliance:   Ms. Feagans was provided with a verbal description of the nature and purpose of the present neuropsychological evaluation. Also reviewed were the foreseeable risks and/or discomforts and benefits of the procedure, limits of confidentiality, and mandatory reporting requirements of this provider. The patient was given the opportunity to ask questions and receive answers about the evaluation. Oral consent to participate was provided by the patient.   This evaluation was conducted by Christia Reading, Ph.D., licensed clinical neuropsychologist. Ms. Fludd completed a comprehensive clinical interview with Dr. Melvyn Novas, billed as one unit [phone], and 165 minutes of cognitive testing and scoring, billed as one unit [phone] and five additional units [id number]. Psychometrist Milana Kidney, B.S., assisted Dr. Melvyn Novas with test  administration and scoring procedures. As a separate and discrete service, Dr. Melvyn Novas spent a total of 180 minutes in interpretation and report writing billed as one unit [phone] and two units [id number].

## 2020-01-03 ENCOUNTER — Encounter: Payer: Self-pay | Admitting: Psychology

## 2020-01-03 ENCOUNTER — Other Ambulatory Visit: Payer: Self-pay

## 2020-01-03 ENCOUNTER — Ambulatory Visit (INDEPENDENT_AMBULATORY_CARE_PROVIDER_SITE_OTHER): Payer: Medicare Other | Admitting: Psychology

## 2020-01-03 DIAGNOSIS — I611 Nontraumatic intracerebral hemorrhage in hemisphere, cortical: Secondary | ICD-10-CM

## 2020-01-03 DIAGNOSIS — I615 Nontraumatic intracerebral hemorrhage, intraventricular: Secondary | ICD-10-CM

## 2020-01-03 DIAGNOSIS — F332 Major depressive disorder, recurrent severe without psychotic features: Secondary | ICD-10-CM

## 2020-01-03 DIAGNOSIS — G40909 Epilepsy, unspecified, not intractable, without status epilepticus: Secondary | ICD-10-CM

## 2020-01-03 NOTE — Progress Notes (Signed)
   Neuropsychology Feedback Session Ashley [name]. [facility]  Reason for Referral:   Ashley [name] a 44 y.o. right-handed Caucasian female referred by Ellouise Newer, M.D.,to characterize hercurrent cognitive functioning and assist with diagnostic clarity and treatment planning in the context of subjective cognitive decline and a history of IVH, SAH, and recurrent seizure activity.  Feedback:   Ashley [name] completed a comprehensive neuropsychological evaluation on 12/27/2019. Please refer to that encounter for the full report and recommendations. Ashley [name] exhibited significant impairments learning and later recalling previously learned verbal information. This, relative to intact visual learning and memory, is consistent with abnormalities in the left hippocampus as seen on imaging. Additional weaknesses were exhibited across tasks assessing visuoperceptual executive dysfunction, including pattern recognition and adaptation to changing demands (WCST) and cognitive flexibility, both visuomotor (TMT B) and verbally mediated (D-KEFS Verbal Fluency) in nature. 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 Bilateral motor impairments were not lateralizing, but likely further suggest increased frontal lobe dysfunction. Semantic fluency also represented a normative weakness and is perhaps more consistent with posterior left hemispheric dysfunction. Ashley [name] reported moderate levels of anxiety and severe rates of depression over the past 1-2 weeks. Severe psychiatric distress can certainly influence cognitive efficiency and is also likely contributing to the presence of cognitive deficits. However, deficits are believed to be above and beyond the contribution of mood-related factors by themselves.  Ashley [name] was accompanied by her husband. Content of the current session focused on the results of her  neuropsychological evaluation. We also discussed recommendations surrounding mood-related medication adjustments and re-engagement in psychotherapy. Ashley [name] and her husband were given the opportunity to ask questions and their questions were answered. They were encouraged to reach out should additional questions arise. A copy of her report was provided at the conclusion of the visit.      28 minutes were spent conducting the current feedback session with Ashley [name], billed as one unit [phone].

## 2020-01-06 DIAGNOSIS — G3184 Mild cognitive impairment, so stated: Secondary | ICD-10-CM | POA: Diagnosis not present

## 2020-01-06 DIAGNOSIS — R569 Unspecified convulsions: Secondary | ICD-10-CM | POA: Diagnosis not present

## 2020-01-06 DIAGNOSIS — F339 Major depressive disorder, recurrent, unspecified: Secondary | ICD-10-CM | POA: Diagnosis not present

## 2020-01-17 DIAGNOSIS — R61 Generalized hyperhidrosis: Secondary | ICD-10-CM | POA: Diagnosis not present

## 2020-01-17 DIAGNOSIS — Z780 Asymptomatic menopausal state: Secondary | ICD-10-CM | POA: Diagnosis not present

## 2020-01-23 DIAGNOSIS — F411 Generalized anxiety disorder: Secondary | ICD-10-CM | POA: Diagnosis not present

## 2020-01-31 DIAGNOSIS — F419 Anxiety disorder, unspecified: Secondary | ICD-10-CM | POA: Diagnosis not present

## 2020-01-31 DIAGNOSIS — F339 Major depressive disorder, recurrent, unspecified: Secondary | ICD-10-CM | POA: Diagnosis not present

## 2020-02-14 ENCOUNTER — Ambulatory Visit: Payer: Medicare Other | Admitting: Physical Medicine & Rehabilitation

## 2020-02-14 ENCOUNTER — Telehealth: Payer: Self-pay | Admitting: Neurology

## 2020-02-14 NOTE — Telephone Encounter (Signed)
Patient called and said she needs refills on her Ativan 1 MG. She explained she takes it when she feels a seizure may be coming on. Patient requests more tablets so she does not run out. Patient stated the pharmacy told her it is too sooner to get a refill.  [facility] on [address].

## 2020-02-14 NOTE — Telephone Encounter (Signed)
Pls let her know we can only do 10 every month. If she is needing more because she feels she will have a seizure, we may need to adjust her seizure medication instead.

## 2020-02-14 NOTE — Telephone Encounter (Signed)
Pt called asking to for more Ativan to be called in to the pharmacy pt advised that 10 tablets is all that can be given and that maybe she needed her seizure medication adjusted with Dr Delice Lesch, pt stated at she has enough Ativan and will talk to dr Delice Lesch at her appointment about her medication.

## 2020-02-15 DIAGNOSIS — L710 Perioral dermatitis: Secondary | ICD-10-CM | POA: Diagnosis not present

## 2020-02-27 DIAGNOSIS — F339 Major depressive disorder, recurrent, unspecified: Secondary | ICD-10-CM | POA: Diagnosis not present

## 2020-02-27 DIAGNOSIS — F419 Anxiety disorder, unspecified: Secondary | ICD-10-CM | POA: Diagnosis not present

## 2020-02-28 DIAGNOSIS — F411 Generalized anxiety disorder: Secondary | ICD-10-CM | POA: Diagnosis not present

## 2020-03-08 ENCOUNTER — Ambulatory Visit (INDEPENDENT_AMBULATORY_CARE_PROVIDER_SITE_OTHER): Payer: Medicare Other | Admitting: Neurology

## 2020-03-08 ENCOUNTER — Encounter: Payer: Self-pay | Admitting: Neurology

## 2020-03-08 ENCOUNTER — Other Ambulatory Visit: Payer: Self-pay

## 2020-03-08 VITALS — BP 95/47 | HR 84 | Ht 63.0 in | Wt 123.0 lb

## 2020-03-08 DIAGNOSIS — G40109 Localization-related (focal) (partial) symptomatic epilepsy and epileptic syndromes with simple partial seizures, not intractable, without status epilepticus: Secondary | ICD-10-CM

## 2020-03-08 DIAGNOSIS — L710 Perioral dermatitis: Secondary | ICD-10-CM | POA: Diagnosis not present

## 2020-03-09 NOTE — Progress Notes (Signed)
NEUROLOGY FOLLOW UP OFFICE NOTE  Haily Houlahan [id number] 12/11/1975  HISTORY OF PRESENT ILLNESS: I had the pleasure of seeing Ashley [name] in follow-up in the neurology clinic on 03/08/2020.  The patient was last seen 7 months ago for seizures secondary to stroke. She is alone in the office today. Records and images were personally reviewed where available.  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 Seizure is also a likely contributor, as well as moderate levels of anxiety and severe levels of depression. She has started seeing a therapist and psychiatrist, with medication adjustments recently made. She is on Prozac 60mg  daily and mirtazapine recently increased to 45mg  qhs. She had called our office last month for lorazepam refills, she had been taking lorazepam due to significant anxiety that she would have a seizure. She now tries to calm herself down and take deep breaths, finding this helps her without need for lorazepam. She denies any seizures since July 2020 on Lamotrigine 200mg  BID and Levetiracetam 1500mg  BID without side effects. She denies any headaches, dizziness, diplopia. She had a fall without any injuries.    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 She had focal seizures in 2016 with EEG showing left PLEDs and has been on Keppra 1500mg  BID since then with no further focal motor seizures, however she would have occasional episodes around once a month where she could not get her words out, lasting 3-4 minutes. Sometimes she would get hot and have to sit but her speech would not be affected.  On 11/27/16, she started having the same speech difficulties where she was making paraphasic errors, but it continued for 30 minutes and they decided to go to the ER. In the ER, she was noted to be aphasic, then as she was having a head CT, she suddenly had right gaze and head deviation, right arm and leg flexion and shaking for around 3 minutes. She was given IV Ativan but her right leg continued to shake in a rhythmic fashion. She was given additional Keppra and started on Vimpat with no further seizures, back to baseline in a couple of hours. She initially had some difficulties with the Vimpat 100mg  BID where she had hallucinations of hearing music or voices, but these have resolved over the past week. She denies any side effects on Keppra 1500mg  BID. She denies any dizziness, diplopia, gait instability. She has a right foot drop, denies any numbness/tingling on the right side but states it feels different that the other side. She denies any olfactory/gustatory hallucinations, myoclonic jerks, episodes of staring/unresponsiveness, gaps in time. She has not had any further episodes of paraphasic errors since her hospitalization. She had a mild diffuse headache for a couple of days but this has resolved. She denies any dizziness, dysarthria/dysphagia, neck/back pain, bowel/bladder dysfunction.  Records from her hospitalization in August 2016 were reviewed. She has been seeing neurologist Dr. Leonie Man for post-stroke and seizure care since then, last visit was in August 2017. She had a severe headache for several days in August 2016 2 weeks post-partum. She then developed right-sided weakness, aphasia, then a focal seizure in the hospital. She was found to have a  large left parietal ICH with IVH, cerebral edema, obstructive hydrocephalus and subarachnoid hemorrhage. MRA and MRV were unremarkable. Concern was for cerebral cortical vein thrombosis not seen on MRV, however RCVS was also a consideration. She was treated  with hypertonic saline and stabilized. She had a seizure with EEG showing left PLEDs. She was started on Keppra. Hypercoagulable labs were normal. She was discharged to rehab and had another seizure on 06/01/16 with report of eyes rolling back and upper extremities shaking. Keppra dose was increased. She had a repeat MRI brain in September 2016 with resolving subacute left parietal hematoma. There was acute gyral swelling and T2 hyperintensity in the left hippocampal formation, left insula, and operculum, that resolved with repeat MRI 09/2015 showing resolution of these changes. She was initially having some headaches and low dose Topamax was briefly added, then with resolution of headaches, this was tapered off. She had been dealing with post-stroke depression and symptoms had improved, however with limitations on driving again, she is starting to feel depressed again. She has three young children, the youngest is 29 months old. She was not happy with neuro rehab and has a private physical therapist coming weekly where she pushes herself and has made a ton of improvement. She does not like to wear her AFO and her husband did not notice any difference in gait with the AFO.   Epilepsy Risk Factors:  History of large left parietal hematoma with encephalomalacia. Otherwise she had a normal birth and early development.  There is no history of febrile convulsions, CNS infections such as meningitis/encephalitis, significant traumatic brain injury, neurosurgical procedures, or family history of seizures.  Diagnostic Data: I personally reviewed MRI brain 11/26/2016 which did not show any acute changes. There was left paramedian parietal encephalomalacia with extensive hemosiderin staining extending into the splenium of the corpus callosum, ex vacuo dilatation of the left lateral ventricle occipital horn, increased side of the temporal horn of the left lateral ventricle which may be due to decreased volume of the  hippocampus. It is smaller in size with increased signal compared to the right. EEG 11/27/16 showed mild left hemispheric slowing EEG 05/22/15: left hemisphere slowing, rare epileptiform discharges over the left anterior temporal region EEG 05/21/15: PLEDs on the left hemisphere, mainly involving the temporo-parietal region  Prior AEDs: Topamax, Vimpat, oxcarbazepine   PAST MEDICAL HISTORY: Past Medical History:  Diagnosis Date  . 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 Cerebral venous thrombosis of cortical vein   . 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 Cytotoxic cerebral edema   . Generalized anxiety disorder 05/28/2015  . Hypotension 06/02/2015  . ICH (intracerebral hemorrhage)   . IVH (intraventricular hemorrhage)   . Left-sided intracerebral hemorrhage 06/04/2015  . Major depressive disorder 03/23/2017  . Right spastic hemiparesis 05/28/2015   Left ICH  . Seizure disorder, nonconvulsive, with status epilepticus   . Sepsis 06/03/2015  . UTI (urinary tract infection) 06/03/2015    MEDICATIONS: Current Outpatient Medications on File Prior to Visit  Medication Sig Dispense Refill  . aspirin 81 MG chewable tablet Chew 81 mg by mouth daily.    . Cholecalciferol (VITAMIN D) 2000 units CAPS Take 2,000 Units by mouth daily.    Marland Kitchen FLUoxetine (PROZAC) 40 MG capsule Take 1 capsule (40 mg total) by mouth daily. 90 capsule 0  . lamoTRIgine (LAMICTAL) 200 MG tablet Take 1 tablet (200 mg total) by  mouth 2 (two) times daily. Take 200 mg BID 180 tablet 3  . levETIRAcetam (KEPPRA) 750 MG tablet Take 2 tablets (1,500 mg total) by mouth 2 (two) times daily. 360 tablet 3  . linaclotide (LINZESS) 145 MCG CAPS capsule Take 145 mcg by mouth daily before breakfast.    . LORazepam (ATIVAN) 1 MG tablet Take 1 tablet as needed for seizures 10 tablet 5  . magnesium oxide (MAG-OX) 400 MG tablet Take 400 mg by mouth 2 (two)  times daily.    . mirtazapine (REMERON) 45 MG tablet SMARTSIG:1 Tablet(s) By Mouth Every Evening    . Multiple Vitamin (MULTIVITAMIN) tablet Take 1 tablet by mouth daily.      No current facility-administered medications on file prior to visit.    ALLERGIES: No Known Allergies  FAMILY HISTORY: Family History  Problem Relation Age of Onset  . Cancer Mother   . Cancer Father        pancreatic    SOCIAL HISTORY: Social History   Socioeconomic History  . Marital status: Married    Spouse name: Not on file  . Number of children: Not on file  . Years of education: 26  . Highest education level: Bachelor's degree (e.g., BA, AB, BS)  Occupational History  . Not on file  Tobacco Use  . Smoking status: Never Smoker  . Smokeless tobacco: Never Used  Substance and Sexual Activity  . Alcohol use: Yes    Alcohol/week: 14.0 - 21.0 standard drinks    Types: 14 - 21 Glasses of wine per week    Comment: a "couple" glasses of wine per night  . Drug use: No  . Sexual activity: Yes    Birth control/protection: None    Comment: husband has vasectomy  Other Topics Concern  . Not on file  Social History Narrative   Lives with husband and three kids in two story home      Right handed      Highest level of edu- bachelors   Social Determinants of Health   Financial Resource Strain:   . Difficulty of Paying Living Expenses:   Food Insecurity:   . Worried About Charity fundraiser in the Last Year:   . Arboriculturist in the Last Year:   Transportation Needs:   . Film/video editor (Medical):   Marland Kitchen Lack of Transportation (Non-Medical):   Physical Activity:   . Days of Exercise per Week:   . Minutes of Exercise per Session:   Stress:   . Feeling of Stress :   Social Connections:   . Frequency of Communication with Friends and Family:   . Frequency of Social Gatherings with Friends and Family:   . Attends Religious Services:   . Active Member of Clubs or Organizations:   .  Attends Archivist Meetings:   Marland Kitchen Marital Status:   Intimate Partner Violence:   . Fear of Current or Ex-Partner:   . Emotionally Abused:   Marland Kitchen Physically Abused:   . Sexually Abused:     REVIEW OF SYSTEMS: Constitutional: No fevers, chills, or sweats, no generalized fatigue, change in appetite Eyes: No visual changes, double vision, eye pain Ear, nose and throat: No hearing loss, ear pain, nasal congestion, sore throat Cardiovascular: No chest pain, palpitations Respiratory:  No shortness of breath at rest or with exertion, wheezes GastrointestinaI: No nausea, vomiting, diarrhea, abdominal pain, fecal incontinence Genitourinary:  No dysuria, urinary retention or frequency Musculoskeletal:  No neck pain,  back pain Integumentary: No rash, pruritus, skin lesions Neurological: as above Psychiatric: + depression, anxiety Endocrine: No palpitations, fatigue, diaphoresis, mood swings, change in appetite, change in weight, increased thirst Hematologic/Lymphatic:  No anemia, purpura, petechiae. Allergic/Immunologic: no itchy/runny eyes, nasal congestion, recent allergic reactions, rashes  PHYSICAL EXAM: Vitals:   03/08/20 1431  BP: (!) 95/47  Pulse: 84   General: No acute distress Head:  Normocephalic/atraumatic Skin/Extremities: No rash, no edema Neurological Exam: alert and oriented to person, place, and time. No aphasia or dysarthria. Fund of knowledge is appropriate.  Recent and remote memory are intact.  Attention and concentration are normal.  Cranial nerves: Pupils equal, round, reactive to light. Extraocular movements intact with no nystagmus. Visual fields full. No facial asymmetry. Motor: Increased tone on right, 5/5 on right UE, left UE and LE, weakness on right LE (chronic) with right AFO. Finger to nose testing intact.  Gait spastic hemiparetic gait (unchanged)  IMPRESSION: This is a pleasant 44 yo RH woman with a history of large left ICH and focal seizures in August  2016 with residual right foot drop. Her last GTC was in 06/2018. She had an increase in seizures last July 2020 that appeared to be in the setting of Lamotrigine toxicity, inadvertently taking 400mg  BID instead of 200mg  BID. No further seizures on Lamotrigine 200mg  BID and Levetiracetam 1500mg  BID. She has been able to minimize lorazepam intake for anxiety and knows to use it for breakthrough seizure. We discussed Neurocognitive testing, continue follow-up with Behavioral Health. She is aware of [place] driving laws to stop driving after an unprovoked seizure until 6 months seizure-free. She will follow-up in 6 months and knows to call for any changes.  Thank you for allowing me to participate in her care.  Please do not hesitate to call for any questions or concerns.   Ellouise Newer, M.D.   CC: Dr. Brigitte Pulse

## 2020-03-13 ENCOUNTER — Encounter: Payer: Medicare Other | Admitting: Physical Medicine & Rehabilitation

## 2020-03-27 DIAGNOSIS — L710 Perioral dermatitis: Secondary | ICD-10-CM | POA: Diagnosis not present

## 2020-03-27 MED ORDER — LEVETIRACETAM 1000 MG PO TABS: ORAL_TABLET | ORAL | 11 refills | Status: DC

## 2020-03-30 ENCOUNTER — Encounter: Payer: Self-pay | Admitting: Neurology

## 2020-04-06 ENCOUNTER — Telehealth: Payer: Self-pay

## 2020-04-06 NOTE — Telephone Encounter (Signed)
LVM for patient - MD ask that no show be removed and reschedule.  Please advise patient messaging only announces we are closed due to patients historically leaving emergent RX messages on voicemail - business decision to accept no messaging over weekend - can leave mychart message however.

## 2020-04-10 DIAGNOSIS — L710 Perioral dermatitis: Secondary | ICD-10-CM | POA: Diagnosis not present

## 2020-04-10 DIAGNOSIS — F411 Generalized anxiety disorder: Secondary | ICD-10-CM | POA: Diagnosis not present

## 2020-05-22 ENCOUNTER — Other Ambulatory Visit: Payer: Self-pay | Admitting: Neurology

## 2020-06-03 ENCOUNTER — Other Ambulatory Visit: Payer: Self-pay | Admitting: Neurology

## 2020-06-25 DIAGNOSIS — F411 Generalized anxiety disorder: Secondary | ICD-10-CM | POA: Diagnosis not present

## 2020-07-09 DIAGNOSIS — L710 Perioral dermatitis: Secondary | ICD-10-CM | POA: Diagnosis not present

## 2020-07-12 ENCOUNTER — Other Ambulatory Visit: Payer: Self-pay | Admitting: Neurology

## 2020-07-31 DIAGNOSIS — R7989 Other specified abnormal findings of blood chemistry: Secondary | ICD-10-CM | POA: Diagnosis not present

## 2020-08-07 DIAGNOSIS — Z1331 Encounter for screening for depression: Secondary | ICD-10-CM | POA: Diagnosis not present

## 2020-08-07 DIAGNOSIS — M79674 Pain in right toe(s): Secondary | ICD-10-CM | POA: Diagnosis not present

## 2020-08-07 DIAGNOSIS — Z0000 Encounter for general adult medical examination without abnormal findings: Secondary | ICD-10-CM | POA: Diagnosis not present

## 2020-08-07 DIAGNOSIS — G40909 Epilepsy, unspecified, not intractable, without status epilepticus: Secondary | ICD-10-CM | POA: Diagnosis not present

## 2020-08-07 DIAGNOSIS — Z23 Encounter for immunization: Secondary | ICD-10-CM | POA: Diagnosis not present

## 2020-08-07 DIAGNOSIS — G3184 Mild cognitive impairment, so stated: Secondary | ICD-10-CM | POA: Diagnosis not present

## 2020-08-07 DIAGNOSIS — I69351 Hemiplegia and hemiparesis following cerebral infarction affecting right dominant side: Secondary | ICD-10-CM | POA: Diagnosis not present

## 2020-08-07 DIAGNOSIS — R8290 Unspecified abnormal findings in urine: Secondary | ICD-10-CM | POA: Diagnosis not present

## 2020-08-07 DIAGNOSIS — R82998 Other abnormal findings in urine: Secondary | ICD-10-CM | POA: Diagnosis not present

## 2020-08-07 DIAGNOSIS — Z803 Family history of malignant neoplasm of breast: Secondary | ICD-10-CM | POA: Diagnosis not present

## 2020-08-07 DIAGNOSIS — Z1339 Encounter for screening examination for other mental health and behavioral disorders: Secondary | ICD-10-CM | POA: Diagnosis not present

## 2020-08-07 DIAGNOSIS — F419 Anxiety disorder, unspecified: Secondary | ICD-10-CM | POA: Diagnosis not present

## 2020-08-07 DIAGNOSIS — F324 Major depressive disorder, single episode, in partial remission: Secondary | ICD-10-CM | POA: Diagnosis not present

## 2020-08-07 DIAGNOSIS — I69320 Aphasia following cerebral infarction: Secondary | ICD-10-CM | POA: Diagnosis not present

## 2020-08-13 DIAGNOSIS — Z01419 Encounter for gynecological examination (general) (routine) without abnormal findings: Secondary | ICD-10-CM | POA: Diagnosis not present

## 2020-08-13 DIAGNOSIS — Z1231 Encounter for screening mammogram for malignant neoplasm of breast: Secondary | ICD-10-CM | POA: Diagnosis not present

## 2020-08-13 DIAGNOSIS — Z3040 Encounter for surveillance of contraceptives, unspecified: Secondary | ICD-10-CM | POA: Diagnosis not present

## 2020-09-20 ENCOUNTER — Encounter: Payer: Self-pay | Admitting: Gastroenterology

## 2020-10-10 ENCOUNTER — Ambulatory Visit (INDEPENDENT_AMBULATORY_CARE_PROVIDER_SITE_OTHER): Payer: Medicare Other | Admitting: Neurology

## 2020-10-10 ENCOUNTER — Other Ambulatory Visit: Payer: Self-pay

## 2020-10-10 ENCOUNTER — Encounter: Payer: Self-pay | Admitting: Neurology

## 2020-10-10 VITALS — BP 100/59 | HR 87 | Resp 20 | Ht 63.0 in | Wt 125.0 lb

## 2020-10-10 DIAGNOSIS — G40109 Localization-related (focal) (partial) symptomatic epilepsy and epileptic syndromes with simple partial seizures, not intractable, without status epilepticus: Secondary | ICD-10-CM | POA: Diagnosis not present

## 2020-10-10 MED ORDER — LAMOTRIGINE 200 MG PO TABS: ORAL_TABLET | ORAL | 3 refills | Status: DC

## 2020-10-10 MED ORDER — LORAZEPAM 1 MG PO TABS: ORAL_TABLET | ORAL | 5 refills | Status: DC

## 2020-10-10 MED ORDER — LEVETIRACETAM 1000 MG PO TABS: ORAL_TABLET | ORAL | 3 refills | Status: DC

## 2020-10-10 NOTE — Progress Notes (Signed)
NEUROLOGY FOLLOW UP OFFICE NOTE  Ashley [name] [id number] Jun 08, 1976  HISTORY OF PRESENT ILLNESS: I had the pleasure of seeing Ashley [name] in follow-up in the neurology clinic on 10/10/2020.  The patient was last seen 7 months ago for seizures secondary to stroke. She is alone in the office today. Records and images were personally reviewed where available.  Since her last visit, she contacted our office in June 2021 to report that there were at least 5 instances where she started feeling symptoms as if it may lead to a seizure with jerking in her arms and legs, numbness in her right side, appearing in a daze. She would take Ativan and hydrate and symptoms would subside 10-15 minutes later. Levetiracetam was increased to 1500mg  in AM, 2000mg  in PM. She is also taking Lamotrigine 200mg  BID. No side effects on medications. She denies any further episodes since then. She still has times where she feels like she may have one, usually when she is worked up about something, around 1-2 times a month. She usually slows down and takes big breaths and starts feeling better. A couple of times she has taken prn Ativan. She has had a couple of mild headaches with good response to Tylenol. No dizziness, diplopia. She denies any staring/unresponsive episodes, loss of time. No falls. Sleep is good. She still has a hard time remembering things, Neuropsychological testing indicated multifactorial etiology due to prior stroke, seizure, as well as moderate levels of anxiety and severe levels of depression. She will be seeing a different therapist in the new year.    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 She had focal seizures in 2016 with EEG showing left PLEDs and has been on Keppra 1500mg  BID since then with no further focal motor seizures, however she would  have occasional episodes around once a month where she could not get her words out, lasting 3-4 minutes. Sometimes she would get hot and have to sit but her speech would not be affected. On 11/27/16, she started having the same speech difficulties where she was making paraphasic errors, but it continued for 30 minutes and they decided to go to the ER. In the ER, she was noted to be aphasic, then as she was having a head CT, she suddenly had right gaze and head deviation, right arm and leg flexion and shaking for around 3 minutes. She was given IV Ativan but her right leg continued to shake in a rhythmic fashion. She was given additional Keppra and started on Vimpat with no further seizures, back to baseline in a couple of hours. She initially had some difficulties with the Vimpat 100mg  BID where she had hallucinations of hearing music or voices, but these have resolved over the past week. She denies any side effects on Keppra 1500mg  BID. She denies any dizziness, diplopia, gait instability. She has a right foot drop, denies any numbness/tingling on the right side but states it feels different that the other side. She denies any olfactory/gustatory hallucinations, myoclonic jerks, episodes of staring/unresponsiveness, gaps in time. She has not had any further episodes of paraphasic errors since her hospitalization. She had a mild diffuse headache for a couple of days but this has resolved. She denies any dizziness, dysarthria/dysphagia, neck/back pain, bowel/bladder dysfunction.  Records from her hospitalization in August 2016 were reviewed. She has been  seeing neurologist Dr. Leonie Man for post-stroke and seizure care since then, last visit was in August 2017. She had a severe headache for several days in August 2016 2 weeks post-partum. She then developed right-sided weakness, aphasia, then a focal seizure in the hospital. She was found to have a large left parietal ICH with IVH, cerebral edema, obstructive  hydrocephalus and subarachnoid hemorrhage. MRA and MRV were unremarkable. Concern was for cerebral cortical vein thrombosis not seen on MRV, however RCVS was also a consideration. She was treated with hypertonic saline and stabilized. She had a seizure with EEG showing left PLEDs. She was started on Keppra. Hypercoagulable labs were normal. She was discharged to rehab and had another seizure on 06/01/16 with report of eyes rolling back and upper extremities shaking. Keppra dose was increased. She had a repeat MRI brain in September 2016 with resolving subacute left parietal hematoma. There was acute gyral swelling and T2 hyperintensity in the left hippocampal formation, left insula, and operculum, that resolved with repeat MRI 09/2015 showing resolution of these changes. She was initially having some headaches and low dose Topamax was briefly added, then with resolution of headaches, this was tapered off. She had been dealing with post-stroke depression and symptoms had improved, however with limitations on driving again, she is starting to feel depressed again. She has three young children, the youngest is 46 months old. She was not happy with neuro rehab and has a private physical therapist coming weekly where she pushes herself and has made a ton of improvement. She does not like to wear her AFO and her husband did not notice any difference in gait with the AFO.   Epilepsy Risk Factors:  History of large left parietal hematoma with encephalomalacia. Otherwise she had a normal birth and early development.  There is no history of febrile convulsions, CNS infections such as meningitis/encephalitis, significant traumatic brain injury, neurosurgical procedures, or family history of seizures.  Diagnostic Data: I personally reviewed MRI brain 11/26/2016 which did not show any acute changes. There was left paramedian parietal encephalomalacia with extensive hemosiderin staining extending into the splenium of the  corpus callosum, ex vacuo dilatation of the left lateral ventricle occipital horn, increased side of the temporal horn of the left lateral ventricle which may be due to decreased volume of the hippocampus. It is smaller in size with increased signal compared to the right. EEG 11/27/16 showed mild left hemispheric slowing EEG 05/22/15: left hemisphere slowing, rare epileptiform discharges over the left anterior temporal region EEG 05/21/15: PLEDs on the left hemisphere, mainly involving the temporo-parietal region  Prior AEDs: Topamax, Vimpat, oxcarbazepine   PAST MEDICAL HISTORY: Past Medical History:  Diagnosis Date  . 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 Cerebral venous thrombosis of cortical vein   . 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 Cytotoxic cerebral edema   . Generalized anxiety disorder 05/28/2015  . Hypotension 06/02/2015  . ICH (intracerebral hemorrhage)   . IVH (intraventricular hemorrhage)   . Left-sided intracerebral hemorrhage 06/04/2015  . Major depressive disorder 03/23/2017  . Right spastic hemiparesis 05/28/2015   Left ICH  . Seizure disorder, nonconvulsive, with status epilepticus   . Sepsis 06/03/2015  . UTI (urinary tract infection) 06/03/2015    MEDICATIONS: Current Outpatient Medications on File Prior to Visit  Medication Sig Dispense Refill  . aspirin 81 MG chewable tablet Chew 81 mg by mouth daily.    Marland Kitchen  Cholecalciferol (VITAMIN D) 2000 units CAPS Take 2,000 Units by mouth daily.    Marland Kitchen FLUoxetine (PROZAC) 40 MG capsule Take 1 capsule (40 mg total) by mouth daily. 90 capsule 0  . lamoTRIgine (LAMICTAL) 200 MG tablet TAKE 1 TABLET(200 MG) BY MOUTH TWICE DAILY 180 tablet 3  . levETIRAcetam (KEPPRA) 1000 MG tablet Take 1 and 1/2 tablets in the morning, 2 tablets in the evening 105 tablet 11  . linaclotide (LINZESS) 145 MCG CAPS capsule Take 145 mcg by mouth daily before  breakfast.    . LORazepam (ATIVAN) 1 MG tablet TAKE 1 TABLET BY MOUTH AS NEEDED FOR SEIZURES 10 tablet 5  . magnesium oxide (MAG-OX) 400 MG tablet Take 400 mg by mouth 2 (two) times daily.    . mirtazapine (REMERON) 45 MG tablet SMARTSIG:1 Tablet(s) By Mouth Every Evening    . Multiple Vitamin (MULTIVITAMIN) tablet Take 1 tablet by mouth daily.      No current facility-administered medications on file prior to visit.    ALLERGIES: No Known Allergies  FAMILY HISTORY: Family History  Problem Relation Age of Onset  . Cancer Mother   . Cancer Father        pancreatic    SOCIAL HISTORY: Social History   Socioeconomic History  . Marital status: Married    Spouse name: Not on file  . Number of children: Not on file  . Years of education: 58  . Highest education level: Bachelor's degree (e.g., BA, AB, BS)  Occupational History  . Not on file  Tobacco Use  . Smoking status: Never Smoker  . Smokeless tobacco: Never Used  Vaping Use  . Vaping Use: Never used  Substance and Sexual Activity  . Alcohol use: Yes    Alcohol/week: 14.0 - 21.0 standard drinks    Types: 14 - 21 Glasses of wine per week    Comment: a "couple" glasses of wine per night  . Drug use: No  . Sexual activity: Yes    Birth control/protection: None    Comment: husband has vasectomy  Other Topics Concern  . Not on file  Social History Narrative   Lives with husband and three kids in two story home      Right handed      Highest level of edu- bachelors   Social Determinants of Health   Financial Resource Strain: Not on file  Food Insecurity: Not on file  Transportation Needs: Not on file  Physical Activity: Not on file  Stress: Not on file  Social Connections: Not on file  Intimate Partner Violence: Not on file     PHYSICAL EXAM: Vitals:   10/10/20 1005  BP: (!) 100/59  Pulse: 87  Resp: 20  SpO2: 97%   General: No acute distress Head:  Normocephalic/atraumatic Skin/Extremities: No rash,  no edema Neurological Exam: alert and awake. No aphasia or dysarthria. Fund of knowledge is appropriate.Attention and concentration are normal.   Cranial nerves: Pupils equal, round. Extraocular movements intact with no nystagmus. Visual fields full.  No facial asymmetry.  Motor: Bulk and tone normal, muscle strength 5/5 throughout except for right foot drop with right AFO. Finger to nose testing intact.  Gait spastic hemiparetic gait (unchanged)   IMPRESSION: This is a pleasant 43 yo RH woman with a history of large left ICH and focal seizures in August 2016 with residual right foot drop. Her last GTC was in 06/2018. She had focal seizures in July 2020 due to medication issues. She is overall  doing well on current regimen of Lamotrigine 200mg  BID and Levetiracetam 1500mg  in AM, 2000mg  in PM. She has occasional symptoms that appear more anxiety-related, continue follow-up with Behavioral Health. She has prn Ativan for seizure rescue. She is aware of [place] driving laws to stop driving after a seizure until 6 months seizure-free. Follow-up in 6-8 months, she knows to call for any changes.   Thank you for allowing me to participate in her care.  Please do not hesitate to call for any questions or concerns.   Ellouise Newer, M.D.   CC: Dr. Brigitte Pulse

## 2020-10-10 NOTE — Patient Instructions (Signed)
Always good to see you. Continue all your medications. Follow-up in 6-8 months, call for any changes.   Seizure Precautions: 1. If medication has been prescribed for you to prevent seizures, take it exactly as directed.  Do not stop taking the medicine without talking to your doctor first, even if you have not had a seizure in a long time.   2. Avoid activities in which a seizure would cause danger to yourself or to others.  Don't operate dangerous machinery, swim alone, or climb in high or dangerous places, such as on ladders, roofs, or girders.  Do not drive unless your doctor says you may.  3. If you have any warning that you may have a seizure, lay down in a safe place where you can't hurt yourself.    4.  No driving for 6 months from last seizure, as per [facility].   Please refer to the following link on the Epilepsy Foundation of America's website for more information: http://www.epilepsyfoundation.org/answerplace/Social/driving/drivingu.cfm   5.  Maintain good sleep hygiene. Avoid alcohol.  6.  Notify your neurology if you are planning pregnancy or if you become pregnant.  7.  Contact your doctor if you have any problems that may be related to the medicine you are taking.  8.  Call 911 and bring the patient back to the ED if:        A.  The seizure lasts longer than 5 minutes.       B.  The patient doesn't awaken shortly after the seizure  C.  The patient has new problems such as difficulty seeing, speaking or moving  D.  The patient was injured during the seizure  E.  The patient has a temperature over 102 F (39C)  F.  The patient vomited and now is having trouble breathing

## 2020-10-17 DIAGNOSIS — F331 Major depressive disorder, recurrent, moderate: Secondary | ICD-10-CM | POA: Diagnosis not present

## 2020-10-17 DIAGNOSIS — F419 Anxiety disorder, unspecified: Secondary | ICD-10-CM | POA: Diagnosis not present

## 2020-10-19 DIAGNOSIS — F331 Major depressive disorder, recurrent, moderate: Secondary | ICD-10-CM | POA: Diagnosis not present

## 2020-10-19 DIAGNOSIS — F419 Anxiety disorder, unspecified: Secondary | ICD-10-CM | POA: Diagnosis not present

## 2020-10-23 ENCOUNTER — Telehealth: Payer: Self-pay | Admitting: *Deleted

## 2020-10-23 DIAGNOSIS — Z1152 Encounter for screening for COVID-19: Secondary | ICD-10-CM | POA: Diagnosis not present

## 2020-10-23 NOTE — Telephone Encounter (Signed)
Dr Fuller Plan- this is a direct colon scheduled with you for 2-4 Friday - NO GI hx with you-  She had a stoke that caused seizures back in 2016-   Last neuro note 10-10-2020  HISTORY OF PRESENT ILLNESS: I had the pleasure of seeing Ashley [name] in follow-up in the neurology clinic on 10/10/2020.  The patient was last seen 7 months ago for seizures secondary to stroke. She is alone in the office today. Records and images were personally reviewed where available.  Since her last visit, she contacted our office in June 2021 to report that there were at least 5 instances where she started feeling symptoms as if it may lead to a seizure with jerking in her arms and legs, numbness in her right side, appearing in a daze. She would take Ativan and hydrate and symptoms would subside 10-15 minutes later. Levetiracetam was increased to 1500mg  in AM, 2000mg  in PM. She is also taking Lamotrigine 200mg  BID. No side effects on medications. She denies any further episodes since then. She still has times where she feels like she may have one, usually when she is worked up about something, around 1-2 times a month. She usually slows down and takes big breaths and starts feeling better. A couple of times she has taken prn Ativan. She has had a couple of mild headaches with good response to Tylenol. No dizziness, diplopia. She denies any staring/unresponsive episodes, loss of time. No falls. Sleep is good. She still has a hard time remembering things, Neuropsychological testing indicated multifactorial etiology due to prior stroke, seizure, as well as moderate levels of anxiety and severe levels of depression. She will be seeing a different therapist in the new year.   OV or okay to proceed as scheduled with last episodes 03-2020 ?  Thanks for your time,[name]

## 2020-10-23 NOTE — Telephone Encounter (Signed)
John,  At Dr Lynne Leader Request, can you review and Let me know if she qualifies for an [place] procedure.  Thanks,[name]

## 2020-10-23 NOTE — Telephone Encounter (Signed)
OK with me as she is closely followed by neurologist who has her on an optimal regimen for her seizure disorder and she appears to be compliant with her medications and neurology follow up. Please also check with Osvaldo Angst.

## 2020-10-24 NOTE — Telephone Encounter (Signed)
noted 

## 2020-10-24 NOTE — Telephone Encounter (Signed)
Ashley [name],  This pt's seizures have been managed for a number of years; she is cleared for care at [facility].  Thanks,  Osvaldo Angst

## 2020-10-31 DIAGNOSIS — U071 COVID-19: Secondary | ICD-10-CM | POA: Diagnosis not present

## 2020-10-31 DIAGNOSIS — M898X1 Other specified disorders of bone, shoulder: Secondary | ICD-10-CM | POA: Diagnosis not present

## 2020-10-31 DIAGNOSIS — R079 Chest pain, unspecified: Secondary | ICD-10-CM | POA: Diagnosis not present

## 2020-10-31 DIAGNOSIS — B028 Zoster with other complications: Secondary | ICD-10-CM | POA: Diagnosis not present

## 2020-11-05 ENCOUNTER — Ambulatory Visit (AMBULATORY_SURGERY_CENTER): Payer: Self-pay | Admitting: *Deleted

## 2020-11-05 ENCOUNTER — Other Ambulatory Visit: Payer: Self-pay

## 2020-11-05 VITALS — Ht 63.0 in | Wt 124.0 lb

## 2020-11-05 DIAGNOSIS — Z1211 Encounter for screening for malignant neoplasm of colon: Secondary | ICD-10-CM

## 2020-11-05 MED ORDER — PLENVU 140 G PO SOLR: 1.0000 | Freq: Once | ORAL | 0 refills | Status: AC

## 2020-11-05 NOTE — Progress Notes (Signed)
Patient is here in-person for PV. Patient denies any allergies to eggs or soy. Patient denies any problems with anesthesia/sedation. Patient denies any oxygen use at home. Patient denies taking any diet/weight loss medications or blood thinners. Patient is not being treated for MRSA or C-diff. Patient is aware of our care-partner policy and [id number]-81 safety protocol. EMMI education assigned to the patient for the procedure, sent to [place] [facility].   COVID-19 vaccines completed on x3 and + covid test on 10/23/2020, per patient. Patient is being treated for shingles since Friday-pt denies any open areas with drainage.  Prep Prescription coupon given to the patient.

## 2020-11-08 DIAGNOSIS — K5900 Constipation, unspecified: Secondary | ICD-10-CM | POA: Diagnosis not present

## 2020-11-08 DIAGNOSIS — R1031 Right lower quadrant pain: Secondary | ICD-10-CM | POA: Diagnosis not present

## 2020-11-08 DIAGNOSIS — R109 Unspecified abdominal pain: Secondary | ICD-10-CM | POA: Diagnosis not present

## 2020-11-15 ENCOUNTER — Other Ambulatory Visit: Payer: Self-pay | Admitting: Internal Medicine

## 2020-11-15 DIAGNOSIS — F419 Anxiety disorder, unspecified: Secondary | ICD-10-CM | POA: Diagnosis not present

## 2020-11-15 DIAGNOSIS — F331 Major depressive disorder, recurrent, moderate: Secondary | ICD-10-CM | POA: Diagnosis not present

## 2020-11-15 DIAGNOSIS — R7989 Other specified abnormal findings of blood chemistry: Secondary | ICD-10-CM

## 2020-11-15 DIAGNOSIS — R109 Unspecified abdominal pain: Secondary | ICD-10-CM

## 2020-11-16 ENCOUNTER — Ambulatory Visit (AMBULATORY_SURGERY_CENTER): Payer: Medicare Other | Admitting: Gastroenterology

## 2020-11-16 ENCOUNTER — Encounter: Payer: Self-pay | Admitting: Gastroenterology

## 2020-11-16 ENCOUNTER — Other Ambulatory Visit: Payer: Self-pay

## 2020-11-16 VITALS — BP 98/59 | HR 89 | Temp 97.8°F | Resp 20 | Ht 63.0 in | Wt 124.0 lb

## 2020-11-16 DIAGNOSIS — Z1211 Encounter for screening for malignant neoplasm of colon: Secondary | ICD-10-CM | POA: Diagnosis not present

## 2020-11-16 MED ORDER — SODIUM CHLORIDE 0.9 % IV SOLN: 500.0000 mL | Freq: Once | INTRAVENOUS | Status: DC

## 2020-11-16 NOTE — Patient Instructions (Signed)
Resume previous diet Continue current medications  YOU HAD AN ENDOSCOPIC PROCEDURE TODAY AT THE [facility]:   Refer to the procedure report that was given to you for any specific questions about what was found during the examination.  If the procedure report does not answer your questions, please call your gastroenterologist to clarify.  If you requested that your care partner not be given the details of your procedure findings, then the procedure report has been included in a sealed envelope for you to review at your convenience later.  YOU SHOULD EXPECT: Some feelings of bloating in the abdomen. Passage of more gas than usual.  Walking can help get rid of the air that was put into your GI tract during the procedure and reduce the bloating. If you had a lower endoscopy (such as a colonoscopy or flexible sigmoidoscopy) you may notice spotting of blood in your stool or on the toilet paper. If you underwent a bowel prep for your procedure, you may not have a normal bowel movement for a few days.  Please Note:  You might notice some irritation and congestion in your nose or some drainage.  This is from the oxygen used during your procedure.  There is no need for concern and it should clear up in a day or so.  SYMPTOMS TO REPORT IMMEDIATELY:  Following lower endoscopy (colonoscopy or flexible sigmoidoscopy):  Excessive amounts of blood in the stool  Significant tenderness or worsening of abdominal pains  Swelling of the abdomen that is new, acute  Fever of 100F or higher  For urgent or emergent issues, a gastroenterologist can be reached at any hour by calling [phone]. Do not use MyChart messaging for urgent concerns.   DIET:  We do recommend a small meal at first, but then you may proceed to your regular diet.  Drink plenty of fluids but you should avoid alcoholic beverages for 24 hours.  ACTIVITY:  You should plan to take it easy for the rest of today and you should NOT  DRIVE or use heavy machinery until tomorrow (because of the sedation medicines used during the test).    FOLLOW UP: Our staff will call the number listed on your records 48-72 hours following your procedure to check on you and address any questions or concerns that you may have regarding the information given to you following your procedure. If we do not reach you, we will leave a message.  We will attempt to reach you two times.  During this call, we will ask if you have developed any symptoms of COVID 19. If you develop any symptoms (ie: fever, flu-like symptoms, shortness of breath, cough etc.) before then, please call [phone].  If you test positive for Covid 19 in the 2 weeks post procedure, please call and report this information to us.    If any biopsies were taken you will be contacted by phone or by letter within the next 1-3 weeks.  Please call us at [phone] if you have not heard about the biopsies in 3 weeks.   SIGNATURES/CONFIDENTIALITY: You and/or your care partner have signed paperwork which will be entered into your electronic medical record.  These signatures attest to the fact that that the information above on your After Visit Summary has been reviewed and is understood.  Full responsibility of the confidentiality of this discharge information lies with you and/or your care-partner.  

## 2020-11-16 NOTE — Op Note (Signed)
[facility] Patient Name: Keianna Hartsock Procedure Date: 11/16/2020 12:05 PM MRN: [id number] Endoscopist: Ladene Artist , MD Age: 45 Referring MD:  Date of Birth: [birth year] Gender: Female Account #: [id number] Procedure:                Colonoscopy Indications:              Screening for colorectal malignant neoplasm Medicines:                Monitored Anesthesia Care Procedure:                Pre-Anesthesia Assessment:                           - Prior to the procedure, a History and Physical                            was performed, and patient medications and                            allergies were reviewed. The patient's tolerance of                            previous anesthesia was also reviewed. The risks                            and benefits of the procedure and the sedation                            options and risks were discussed with the patient.                            All questions were answered, and informed consent                            was obtained. Prior Anticoagulants: The patient has                            taken no previous anticoagulant or antiplatelet                            agents. ASA Grade Assessment: III - A patient with                            severe systemic disease. After reviewing the risks                            and benefits, the patient was deemed in                            satisfactory condition to undergo the procedure.                           After obtaining informed consent, the colonoscope  was passed under direct vision. Throughout the                            procedure, the patient's blood pressure, pulse, and                            oxygen saturations were monitored continuously. The                            Olympus CF-HQ190L [id number]) Colonoscope was                            introduced through the anus and advanced to the the                            cecum,  identified by appendiceal orifice and                            ileocecal valve. The ileocecal valve, appendiceal                            orifice, and rectum were photographed. The quality                            of the bowel preparation was good after extensive                            lavage and suction. The colonoscopy was somewhat                            difficult due to significant looping and a tortuous                            colon. The patient tolerated the procedure well. Scope In: 12:13:03 PM Scope Out: 12:38:29 PM Scope Withdrawal Time: 0 hours 18 minutes 38 seconds  Total Procedure Duration: 0 hours 25 minutes 26 seconds  Findings:                 The perianal and digital rectal examinations were                            normal.                           The entire examined colon appeared normal on direct                            and retroflexion views. Complications:            No immediate complications. Estimated blood loss:                            None. Estimated Blood Loss:     Estimated blood loss: none. Impression:               - The entire  examined colon is normal on direct and                            retroflexion views.                           - No specimens collected. Recommendation:           - Repeat colonoscopy in 10 years for screening                            purposes.                           - Patient has a contact number available for                            emergencies. The signs and symptoms of potential                            delayed complications were discussed with the                            patient. Return to normal activities tomorrow.                            Written discharge instructions were provided to the                            patient.                           - Resume previous diet.                           - Continue present medications. Ladene Artist, MD 11/16/2020 12:41:07 PM This report  has been signed electronically.

## 2020-11-16 NOTE — Progress Notes (Signed)
Report given to PACU, vss 

## 2020-11-16 NOTE — Progress Notes (Signed)
Medical history reviewed with no changes noted. VS assessed by C.W 

## 2020-11-16 NOTE — Progress Notes (Signed)
1220 Ephedrine 10 mg given IV due to low BP, MD updated.

## 2020-11-16 NOTE — Progress Notes (Signed)
1215 Ephedrine 10 mg given IV due to low BP, MD updated.

## 2020-11-16 NOTE — Progress Notes (Signed)
1230 Ephedrine 10 mg given IV due to low BP, MD updated.  

## 2020-11-16 NOTE — Progress Notes (Signed)
1215 Ephedrine 15  mg given IV due to low BP, MD updated.

## 2020-11-20 ENCOUNTER — Telehealth: Payer: Self-pay | Admitting: *Deleted

## 2020-11-20 ENCOUNTER — Telehealth: Payer: Self-pay

## 2020-11-20 NOTE — Telephone Encounter (Signed)
Left message on answering machine. 

## 2020-11-20 NOTE — Telephone Encounter (Signed)
Attempted 2nd f/u phone call. No answer. Left message.  °

## 2020-11-30 ENCOUNTER — Other Ambulatory Visit: Payer: Medicare Other

## 2020-12-05 DIAGNOSIS — F419 Anxiety disorder, unspecified: Secondary | ICD-10-CM | POA: Diagnosis not present

## 2020-12-05 DIAGNOSIS — F331 Major depressive disorder, recurrent, moderate: Secondary | ICD-10-CM | POA: Diagnosis not present

## 2020-12-14 DIAGNOSIS — F331 Major depressive disorder, recurrent, moderate: Secondary | ICD-10-CM | POA: Diagnosis not present

## 2020-12-14 DIAGNOSIS — F419 Anxiety disorder, unspecified: Secondary | ICD-10-CM | POA: Diagnosis not present

## 2020-12-17 ENCOUNTER — Ambulatory Visit
Admission: RE | Admit: 2020-12-17 | Discharge: 2020-12-17 | Disposition: A | Discharge status: Home / Self Care | Payer: Medicare Other | Source: Ambulatory Visit | Attending: Internal Medicine | Admitting: Internal Medicine

## 2020-12-17 DIAGNOSIS — K7689 Other specified diseases of liver: Secondary | ICD-10-CM | POA: Diagnosis not present

## 2020-12-17 DIAGNOSIS — R7989 Other specified abnormal findings of blood chemistry: Secondary | ICD-10-CM

## 2020-12-17 DIAGNOSIS — R109 Unspecified abdominal pain: Secondary | ICD-10-CM

## 2020-12-31 DIAGNOSIS — R0781 Pleurodynia: Secondary | ICD-10-CM | POA: Diagnosis not present

## 2021-01-04 ENCOUNTER — Ambulatory Visit (HOSPITAL_COMMUNITY)
Admission: RE | Admit: 2021-01-04 | Discharge: 2021-01-04 | Disposition: A | Discharge status: Home / Self Care | Payer: Medicare Other | Source: Ambulatory Visit | Attending: Adult Health | Admitting: Adult Health

## 2021-01-04 ENCOUNTER — Other Ambulatory Visit (HOSPITAL_COMMUNITY): Payer: Self-pay | Admitting: Adult Health

## 2021-01-04 ENCOUNTER — Encounter (HOSPITAL_COMMUNITY): Payer: Self-pay

## 2021-01-04 ENCOUNTER — Other Ambulatory Visit: Payer: Self-pay | Admitting: Adult Health

## 2021-01-04 ENCOUNTER — Other Ambulatory Visit: Payer: Self-pay

## 2021-01-04 DIAGNOSIS — R1031 Right lower quadrant pain: Secondary | ICD-10-CM | POA: Insufficient documentation

## 2021-01-04 DIAGNOSIS — K769 Liver disease, unspecified: Secondary | ICD-10-CM

## 2021-01-04 DIAGNOSIS — R109 Unspecified abdominal pain: Secondary | ICD-10-CM | POA: Diagnosis not present

## 2021-01-04 DIAGNOSIS — M898X1 Other specified disorders of bone, shoulder: Secondary | ICD-10-CM | POA: Diagnosis not present

## 2021-01-04 MED ORDER — IOHEXOL 300 MG/ML  SOLN
100.0000 mL | Freq: Once | INTRAMUSCULAR | Status: AC | PRN
  Administered 2021-01-04: 100 mL via INTRAVENOUS

## 2021-01-05 ENCOUNTER — Encounter (HOSPITAL_COMMUNITY): Payer: Self-pay

## 2021-01-05 ENCOUNTER — Emergency Department (HOSPITAL_COMMUNITY): Payer: Medicare Other

## 2021-01-05 ENCOUNTER — Emergency Department (HOSPITAL_COMMUNITY)
Admission: EM | Admit: 2021-01-05 | Discharge: 2021-01-05 | Disposition: A | Discharge status: Home / Self Care | Payer: Medicare Other | Attending: Emergency Medicine | Admitting: Emergency Medicine

## 2021-01-05 ENCOUNTER — Other Ambulatory Visit: Payer: Self-pay

## 2021-01-05 DIAGNOSIS — Z7982 Long term (current) use of aspirin: Secondary | ICD-10-CM | POA: Insufficient documentation

## 2021-01-05 DIAGNOSIS — R10811 Right upper quadrant abdominal tenderness: Secondary | ICD-10-CM | POA: Insufficient documentation

## 2021-01-05 DIAGNOSIS — R079 Chest pain, unspecified: Secondary | ICD-10-CM | POA: Diagnosis not present

## 2021-01-05 DIAGNOSIS — J189 Pneumonia, unspecified organism: Secondary | ICD-10-CM | POA: Diagnosis not present

## 2021-01-05 LAB — CBC
HCT: 33.6 % — ABNORMAL LOW (ref 36.0–46.0)
Hemoglobin: 11.6 g/dL — ABNORMAL LOW (ref 12.0–15.0)
MCH: 33.1 pg (ref 26.0–34.0)
MCHC: 34.5 g/dL (ref 30.0–36.0)
MCV: 96 fL (ref 80.0–100.0)
Platelets: 234 10*3/uL (ref 150–400)
RBC: 3.5 MIL/uL — ABNORMAL LOW (ref 3.87–5.11)
RDW: 11.9 % (ref 11.5–15.5)
WBC: 11.8 10*3/uL — ABNORMAL HIGH (ref 4.0–10.5)
nRBC: 0 % (ref 0.0–0.2)

## 2021-01-05 LAB — LACTIC ACID, PLASMA
Lactic Acid, Venous: 3 mmol/L (ref 0.5–1.9)
Lactic Acid, Venous: 0.8 mmol/L (ref 0.5–1.9)
Lactic Acid, Venous: 0.7 mmol/L (ref 0.5–1.9)

## 2021-01-05 LAB — TROPONIN I (HIGH SENSITIVITY)
Troponin I (High Sensitivity): 5 ng/L (ref <18)
Troponin I (High Sensitivity): 4 ng/L (ref <18)

## 2021-01-05 LAB — BASIC METABOLIC PANEL
Anion gap: 10 (ref 5–15)
BUN: 6 mg/dL (ref 6–20)
CO2: 29 mmol/L (ref 22–32)
Calcium: 9.5 mg/dL (ref 8.9–10.3)
Chloride: 97 mmol/L — ABNORMAL LOW (ref 98–111)
Creatinine, Ser: 0.69 mg/dL (ref 0.44–1.00)
GFR, Estimated: >60 mL/min (ref >60)
Glucose, Bld: 167 mg/dL — ABNORMAL HIGH (ref 70–99)
Potassium: 4.2 mmol/L (ref 3.5–5.1)
Sodium: 136 mmol/L (ref 135–145)

## 2021-01-05 LAB — I-STAT BETA HCG BLOOD, ED (MC, WL, AP ONLY): I-stat hCG, quantitative: <5 m[IU]/mL (ref <5)

## 2021-01-05 MED ORDER — AZITHROMYCIN 250 MG PO TABS
500.0000 mg | ORAL_TABLET | Freq: Once | ORAL | Status: AC
  Administered 2021-01-05: 500 mg via ORAL
  Filled 2021-01-05: qty 2

## 2021-01-05 MED ORDER — MORPHINE SULFATE (PF) 4 MG/ML IV SOLN
4.0000 mg | Freq: Once | INTRAVENOUS | Status: AC
  Administered 2021-01-05: 4 mg via INTRAVENOUS
  Filled 2021-01-05: qty 1

## 2021-01-05 MED ORDER — SODIUM CHLORIDE 0.9 % IV BOLUS
1000.0000 mL | Freq: Once | INTRAVENOUS | Status: AC
  Administered 2021-01-05: 1000 mL via INTRAVENOUS

## 2021-01-05 MED ORDER — AZITHROMYCIN 250 MG PO TABS: 250.0000 mg | ORAL_TABLET | Freq: Every day | ORAL | 0 refills | Status: DC

## 2021-01-05 MED ORDER — SODIUM CHLORIDE 0.9 % IV SOLN
1.0000 g | Freq: Once | INTRAVENOUS | Status: AC
  Administered 2021-01-05: 1 g via INTRAVENOUS
  Filled 2021-01-05: qty 10

## 2021-01-05 MED ORDER — OXYCODONE-ACETAMINOPHEN 5-325 MG PO TABS: 1.0000 | ORAL_TABLET | ORAL | 0 refills | Status: DC | PRN

## 2021-01-05 MED ORDER — CEFDINIR 300 MG PO CAPS: 300.0000 mg | ORAL_CAPSULE | Freq: Two times a day (BID) | ORAL | 0 refills | Status: AC

## 2021-01-05 NOTE — ED Notes (Signed)
EDP notified on patient's elevated lactic acid .

## 2021-01-05 NOTE — Discharge Instructions (Addendum)
Take the antibiotics as prescribed until gone. Take Percocet for pain as needed.   Follow up with your doctor for recheck in 2-3 days.   Return to the ED if you have worsening pain, high fever, shortness of breath or new concern.

## 2021-01-05 NOTE — ED Notes (Signed)
Sharlene Motts PA notified on patient's hypotension .

## 2021-01-05 NOTE — ED Provider Notes (Signed)
[facility] EMERGENCY DEPARTMENT Provider Note   CSN: [id number] Arrival date & time: 01/05/21  5993     History Chief Complaint  Patient presents with  . Chest Pain    Ashley [name] is a 45 y.o. female.  Patient to ED for evaluation of right sided chest pain described as sharp, worse when taking a deep breath. She reports having a cough over the past week, and fever with Tmax 99.7. She was seen by her doctor yesterday and had a CT abd/pel performed but does not know the results. Her pain became severe tonight prompting ED evaluation. No nausea, vomiting. No history of PE/DVT, not on exogenous estrogen. She is COVID vaccinated.  The history is provided by the patient and the spouse. No language interpreter was used.  Chest Pain Associated symptoms: cough and fever   Associated symptoms: no nausea and no vomiting        Past Medical History:  Diagnosis Date  . 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 Cerebral venous thrombosis of cortical vein   . 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 Cytotoxic cerebral edema   . Generalized anxiety disorder 05/28/2015  . Hypotension 06/02/2015  . ICH (intracerebral hemorrhage)   . IVH (intraventricular hemorrhage)   . Left-sided intracerebral hemorrhage 06/04/2015  . Major depressive disorder 03/23/2017  . Right spastic hemiparesis 05/28/2015   Left ICH  . Seizure disorder, nonconvulsive, with status epilepticus    last seizure early 2021  . Sepsis 06/03/2015  . UTI (urinary tract infection) 06/03/2015    Patient Active Problem List   Diagnosis Date Noted  . Major depressive disorder 03/23/2017  . Muscle weakness 02/03/2017  . Spastic hemiplegia and hemiparesis affecting dominant side ([place]) 09/13/2015  . Adhesive capsulitis of right shoulder 08/24/2015  . Localization-related symptomatic epilepsy and epileptic syndromes with  simple partial seizures, not intractable, without status epilepticus ([place]) 08/21/2015  . Nontraumatic cortical hemorrhage of cerebral hemisphere ([place])   . Contracture of muscle ankle and foot 06/11/2015  . Hemiplga fol ntrm intcrbl hemor aff right dominant side ([place]) 06/06/2015  . Left-sided intracerebral hemorrhage ([place]) 06/04/2015  . Seizure disorder as sequela of cerebrovascular accident ([place])   . Seizure disorder ([place]) 06/03/2015  . Sepsis ([place]) 06/03/2015  . Hypotension 06/02/2015  . Right spastic hemiparesis ([place]) 05/28/2015  . 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 Generalized anxiety disorder 05/28/2015  . ICH (intracerebral hemorrhage) ([place])   . Seizure disorder, nonconvulsive, with status epilepticus ([place])   . Cerebral venous thrombosis of cortical vein   . Cytotoxic cerebral edema ([place])   . IVH (intraventricular hemorrhage) ([facility])     Past Surgical History:  Procedure Laterality Date  . COLONOSCOPY  late 20's   [place]-normal exam  . DILATION AND CURETTAGE OF UTERUS       OB History    Gravida  9   Para  3   Term  3   Preterm      AB  6   Living  3     SAB  3   IAB  2   Ectopic      Multiple  0   Live Births  3           Family History  Problem Relation Age of Onset  . Cancer Mother   . Cancer Father  pancreatic  . Colon cancer Neg Hx   . Colon polyps Neg Hx   . Esophageal cancer Neg Hx   . Rectal cancer Neg Hx   . Stomach cancer Neg Hx     Social History   Tobacco Use  . Smoking status: Never Smoker  . Smokeless tobacco: Never Used  Vaping Use  . Vaping Use: Never used  Substance Use Topics  . Alcohol use: Yes    Comment: wine 1-2 times a month per pt  . Drug use: No    Home Medications Prior to Admission medications   Medication Sig Start Date End Date Taking? Authorizing Provider  aspirin 81 MG chewable tablet Chew 81 mg by mouth daily.    [provider]   Cholecalciferol (VITAMIN D) 2000 units CAPS Take 2,000 Units by mouth daily.    [provider]  FLUoxetine (PROZAC) 40 MG capsule Take 1 capsule (40 mg total) by mouth daily. Patient taking differently: Take 60 mg by mouth daily. 06/08/18   Aundra Dubin, MD  lamoTRIgine (LAMICTAL) 200 MG tablet TAKE 1 TABLET(200 MG) BY MOUTH TWICE DAILY 10/10/20   Cameron Sprang, MD  levETIRAcetam (KEPPRA) 1000 MG tablet Take 1 and 1/2 tablets in the morning, 2 tablets in the evening 10/10/20   Cameron Sprang, MD  linaclotide [facility]) 145 MCG CAPS capsule Take 290 mcg by mouth daily before breakfast.    [provider]  LORazepam (ATIVAN) 1 MG tablet TAKE 1 TABLET BY MOUTH AS NEEDED FOR SEIZURES 10/10/20   Cameron Sprang, MD  magnesium oxide (MAG-OX) 400 MG tablet Take 400 mg by mouth 2 (two) times daily.    [provider]  mirtazapine (REMERON) 45 MG tablet SMARTSIG:1 Tablet(s) By Mouth Every Evening 02/28/20   [provider]  Multiple Vitamin (MULTIVITAMIN) tablet Take 1 tablet by mouth daily.     [provider]  valACYclovir (VALTREX) 1000 MG tablet Take 1,000 mg by mouth 3 (three) times daily. Patient not taking: Reported on 11/16/2020 10/31/20   [provider]    Allergies    Patient has no known allergies.  Review of Systems   Review of Systems  Constitutional: Positive for fever. Negative for chills.  HENT: Negative.  Negative for congestion.   Respiratory: Positive for cough.   Cardiovascular: Positive for chest pain.  Gastrointestinal: Negative.  Negative for nausea and vomiting.  Musculoskeletal: Negative.  Negative for myalgias.  Skin: Negative.   Neurological: Negative.     Physical Exam Updated Vital Signs BP (!) 91/48 (BP Location: Right Arm)   Pulse 97   Temp 99.3 F (37.4 C)   Resp 20   SpO2 98%   Physical Exam Vitals and nursing note reviewed.  Constitutional:      Appearance: She is well-developed.  HENT:      Head: Normocephalic.  Cardiovascular:     Rate and Rhythm: Normal rate and regular rhythm.  Pulmonary:     Effort: Pulmonary effort is normal.     Breath sounds: Normal breath sounds.     Comments: Decreased breath sounds on right mid- to lower lobes. No wheezing, rales, rhonchi.  Abdominal:     General: Bowel sounds are normal.     Palpations: Abdomen is soft.     Tenderness: There is abdominal tenderness (RUQ tenderness, mild). There is no guarding or rebound.  Musculoskeletal:        General: Normal range of motion.     Cervical back:  Normal range of motion and neck supple.  Skin:    General: Skin is warm and dry.     Findings: No rash.  Neurological:     Mental Status: She is alert.     Cranial Nerves: No cranial nerve deficit.     ED Results / Procedures / Treatments   Labs (all labs ordered are listed, but only abnormal results are displayed) Labs Reviewed  CBC - Abnormal; Notable for the following components:      Result Value   WBC 11.8 (*)    RBC 3.50 (*)    Hemoglobin 11.6 (*)    HCT 33.6 (*)    All other components within normal limits  BASIC METABOLIC PANEL  LACTIC ACID, PLASMA  LACTIC ACID, PLASMA  I-STAT BETA HCG BLOOD, ED ([facility], [facility], [facility] ONLY)  TROPONIN I (HIGH SENSITIVITY)    EKG EKG Interpretation  Date/Time:  Saturday January 05 2021 02:26:53 EDT Ventricular Rate:  95 PR Interval:  146 QRS Duration: 88 QT Interval:  334 QTC Calculation: 419 R Axis:   78 Text Interpretation: Normal sinus rhythm Nonspecific T wave abnormality Abnormal ECG No significant change since last tracing Confirmed by Orpah Greek [phone]) on 01/05/2021 2:33:20 AM   Radiology DG Chest 2 View  Result Date: 01/05/2021 CLINICAL DATA:  Chest pain EXAM: CHEST - 2 VIEW COMPARISON:  Radiograph September 19, 2019.  Same day abdominal CT FINDINGS: The heart size and mediastinal contours are within normal limits. Right middle lobe consolidation. No pleural effusion. No  pneumothorax. The visualized skeletal structures are unremarkable. IMPRESSION: Rounded right middle lobe consolidation, pneumonia versus atelectasis. Recommend clinical correlation. Electronically Signed   By: Dahlia Bailiff MD   On: 01/05/2021 03:05   CT ABDOMEN PELVIS W CONTRAST  Result Date: 01/04/2021 CLINICAL DATA:  RIGHT lower quadrant abdominal pain, liver lesion EXAM: CT ABDOMEN AND PELVIS WITH CONTRAST TECHNIQUE: Multidetector CT imaging of the abdomen and pelvis was performed using the standard protocol following bolus administration of intravenous contrast. Sagittal and coronal MPR images reconstructed from axial data set. CONTRAST:  124mL OMNIPAQUE IOHEXOL 300 MG/ML SOLN IV. Dilute oral contrast. COMPARISON:  None Correlation: Ultrasound abdomen RIGHT upper quadrant 12/17/2020 FINDINGS: Lower chest: Subsegmental atelectasis versus consolidation in the medial segment of RIGHT middle lobe. Remaining lung bases clear Hepatobiliary: Gallbladder and liver normal appearance. No liver mass identified. Pancreas: Normal appearance Spleen: Normal appearance Adrenals/Urinary Tract: Adrenal glands, kidneys, ureters, and bladder normal appearance. Stomach/Bowel: Normal appendix. Stomach and bowel loops normal appearance. Vascular/Lymphatic: Vascular structures patent. No adenopathy. Few pelvic phleboliths. Reproductive: Unremarkable uterus and adnexa Other: No free air or free fluid.  No hernia. Musculoskeletal: Unremarkable IMPRESSION: Atelectasis versus consolidation RIGHT middle lobe, cannot exclude pneumonia. No acute intra-abdominal or intrapelvic abnormalities. Electronically Signed   By: Lavonia Dana M.D.   On: 01/04/2021 20:56    Procedures Procedures   Medications Ordered in ED Medications  morphine 4 MG/ML injection 4 mg (has no administration in time range)  cefTRIAXone (ROCEPHIN) 1 g in sodium chloride 0.9 % 100 mL IVPB (has no administration in time range)    ED Course  I have reviewed  the triage vital signs and the nursing notes.  Pertinent labs & imaging results that were available during my care of the patient were reviewed by me and considered in my medical decision making (see chart for details).    MDM Rules/Calculators/A&P  Patient to ED with severe right sided chest pain progressively worsening over the last several days. Coughing for several days prior to onset of pain. She reports fever, however, Tmax 99.7. No vomiting.   CT done yesterday by PCP. Results reviewed and is concerning only for appearance of consolidation vs atx right middle lobe.   She appears uncomfortable but nontoxic. No labored breathing. Abdomen nontender. CXR tonight c/w CT findings of consolidation vs atx, favor PNA given clinical picture, gradual onset of symptoms. Doubt pain from ACS. No tachycardia or PE risk factor to suggest clot.   IV Rocephin given here, oral Zithromax for CAP. Anticipate discharge home.   Pain addressed. She is comfortable after Morphine provided. Blood pressure is low but c/w her usual BP, per patient and husband.   Lactic acid 3.0. No leukocytosis. No other sepsis criteria. IV fluids ordered with plan to recheck another level to insure it is not increasing. The scheduled 2nd lactate was drawn prior to IVF's and results as 0.8, suggesting lab error of one of the two values obtained. Fluids have completed. A 3rd level is felt indicated. Patient and spouse updated.   Third lactic acid normal. Patient is felt appropriate for discharge home.  Final Clinical Impression(s) / ED Diagnoses Final diagnoses:  None   1. CAP  Rx / DC Orders ED Discharge Orders    None       Charlann Lange, PA-C 01/05/21 0726    Orpah Greek, MD 01/05/21 [phone]

## 2021-01-05 NOTE — ED Triage Notes (Signed)
Pt started having R side chest pain and shortness of breath several days ago. Has been seen for same and had CT scan yesterday.  Pt has had fevers.

## 2021-01-05 NOTE — ED Notes (Signed)
Patient discharge instructions reviewed with the patient. The patient verbalized understanding of instructions. Patient discharged. 

## 2021-01-07 DIAGNOSIS — J189 Pneumonia, unspecified organism: Secondary | ICD-10-CM | POA: Diagnosis not present

## 2021-01-07 DIAGNOSIS — R0781 Pleurodynia: Secondary | ICD-10-CM | POA: Diagnosis not present

## 2021-01-14 DIAGNOSIS — F419 Anxiety disorder, unspecified: Secondary | ICD-10-CM | POA: Diagnosis not present

## 2021-01-14 DIAGNOSIS — F331 Major depressive disorder, recurrent, moderate: Secondary | ICD-10-CM | POA: Diagnosis not present

## 2021-01-22 DIAGNOSIS — J189 Pneumonia, unspecified organism: Secondary | ICD-10-CM | POA: Diagnosis not present

## 2021-02-28 ENCOUNTER — Telehealth: Payer: Self-pay | Admitting: Neurology

## 2021-02-28 NOTE — Telephone Encounter (Signed)
Patient called and said she is having spells again that are seizure-like. It happened three times yesterday. Patient wanted to move her appointment up but has been added to the wait list since no sooner appointments.  Patient stated she may need another medication change.  [facility] [address]

## 2021-02-28 NOTE — Telephone Encounter (Signed)
Pt c/o: seizure Missed medications?  No. Sleep deprived?  No. Alcohol intake?  Yes.   some Back to their usual baseline self?  Yes.  . If no, advise go to ER Any increase in stress? Just a little  Current medications prescribed by Dr. Delice Lesch:  Lamotrigine 200 mg  TAKE 1 TABLET(200 MG) BY MOUTH TWICE DAILY Levetiracetam 1000 mg tablet  Take 1 and 1/2 tablets in the morning, 2 tablets in the evening Lorazepam 1 mg TAKE 1 TABLET BY MOUTH AS NEEDED FOR SEIZURES Notice any triggers: no triggers noticed

## 2021-03-01 MED ORDER — LEVETIRACETAM 1000 MG PO TABS: 2000.0000 mg | ORAL_TABLET | Freq: Two times a day (BID) | ORAL | 1 refills | Status: DC

## 2021-03-01 NOTE — Telephone Encounter (Signed)
Does she want to try increasing the Keppra 1000mg  to 2 tablets twice a day? She is currently taking 1.5 in AM, 2 in PM. We can see how she feels on higher dose, it may make her drowsy initially. If she would like to proceed, pls send in updated Rx, thanks

## 2021-03-01 NOTE — Addendum Note (Signed)
Addended by: Jake Seats on: 03/01/2021 12:58 PM   Modules accepted: Orders

## 2021-03-01 NOTE — Telephone Encounter (Signed)
Spoke with pt she would like to increase her keppra 1000 mg to 2 tablets BID

## 2021-03-12 DIAGNOSIS — F419 Anxiety disorder, unspecified: Secondary | ICD-10-CM | POA: Diagnosis not present

## 2021-03-12 DIAGNOSIS — F331 Major depressive disorder, recurrent, moderate: Secondary | ICD-10-CM | POA: Diagnosis not present

## 2021-05-05 ENCOUNTER — Other Ambulatory Visit: Payer: Self-pay | Admitting: Neurology

## 2021-05-20 DIAGNOSIS — F419 Anxiety disorder, unspecified: Secondary | ICD-10-CM | POA: Diagnosis not present

## 2021-05-20 DIAGNOSIS — F331 Major depressive disorder, recurrent, moderate: Secondary | ICD-10-CM | POA: Diagnosis not present

## 2021-05-24 DIAGNOSIS — M778 Other enthesopathies, not elsewhere classified: Secondary | ICD-10-CM | POA: Diagnosis not present

## 2021-05-24 DIAGNOSIS — M25511 Pain in right shoulder: Secondary | ICD-10-CM | POA: Diagnosis not present

## 2021-06-02 ENCOUNTER — Other Ambulatory Visit: Payer: Self-pay | Admitting: Neurology

## 2021-06-13 ENCOUNTER — Encounter: Payer: Self-pay | Admitting: Neurology

## 2021-06-13 ENCOUNTER — Other Ambulatory Visit: Payer: Self-pay

## 2021-06-13 ENCOUNTER — Ambulatory Visit (INDEPENDENT_AMBULATORY_CARE_PROVIDER_SITE_OTHER): Payer: Medicare Other | Admitting: Neurology

## 2021-06-13 VITALS — BP 91/56 | HR 74 | Ht 63.0 in | Wt 119.0 lb

## 2021-06-13 DIAGNOSIS — G40109 Localization-related (focal) (partial) symptomatic epilepsy and epileptic syndromes with simple partial seizures, not intractable, without status epilepticus: Secondary | ICD-10-CM

## 2021-06-13 MED ORDER — LEVETIRACETAM 1000 MG PO TABS: 2000.0000 mg | ORAL_TABLET | Freq: Two times a day (BID) | ORAL | 3 refills | Status: DC

## 2021-06-13 MED ORDER — LAMOTRIGINE 200 MG PO TABS: ORAL_TABLET | ORAL | 3 refills | Status: DC

## 2021-06-13 NOTE — Progress Notes (Signed)
NEUROLOGY FOLLOW UP OFFICE NOTE  [name] RN:[id number] [birth year]  HISTORY OF PRESENT ILLNESS: I had the pleasure of seeing Ashley [name] in follow-up in the neurology clinic on 06/13/2021.  The patient was last seen 46 months ago for seizures secondary to stroke. She is alone in the office today. Records and images were personally reviewed where available.  Since her last visit, she contacted our office in May 2022 to report symptoms concerning for seizures, saying she was having spells that were seizure-like and occurred three times in one day. Levetiracetam increased to '2000mg'$  BID. She is also on Lamotrigine '200mg'$  BID. She states that she felt like she was jerking all over, but the sensation was internal, people around her did not notice any outward body jerks. She felt a little confused, one time she could not talk, saying people were talking to her and she would not respond. Since increase in Levetiracetam, she has had a couple of episodes where she would alert family she is feeling funny, she can hear and respond to them. She has some difficulty differentiating these from anxiety, sometimes when she gets worked up, she has the funny feeling and takes deep breaths then feels fine. She denies any headaches, dizziness, focal numbness/tingling/new weakness. Her right side is still very sensitive. No falls. She continued to note depression on Prozac and was switched to [place] a few months ago but feels it is making her more moody. Sleep is okay with mirtazapine.   History on Initial Assessment 12/10/2016: This is a pleasant 45 yo RH woman with a history of left parietal ICH in 2016 with residual right foot drop, admitted February 14-15, 2018 for focal seizures. She had focal seizures in 2016 with EEG showing left PLEDs and has been on Keppra '1500mg'$  BID since then with no further focal motor seizures, however she would have occasional episodes around once a month where she could not get her words out,  lasting 3-4 minutes. Sometimes she would get hot and have to sit but her speech would not be affected. On 11/27/16, she started having the same speech difficulties where she was making paraphasic errors, but it continued for 30 minutes and they decided to go to the ER. In the ER, she was noted to be aphasic, then as she was having a head CT, she suddenly had right gaze and head deviation, right arm and leg flexion and shaking for around 3 minutes. She was given IV Ativan but her right leg continued to shake in a rhythmic fashion. She was given additional Keppra and started on Vimpat with no further seizures, back to baseline in a couple of hours. She initially had some difficulties with the Vimpat '100mg'$  BID where she had hallucinations of hearing music or voices, but these have resolved over the past week. She denies any side effects on Keppra '1500mg'$  BID. She denies any dizziness, diplopia, gait instability. She has a right foot drop, denies any numbness/tingling on the right side but states it feels different that the other side. She denies any olfactory/gustatory hallucinations, myoclonic jerks, episodes of staring/unresponsiveness, gaps in time. She has not had any further episodes of paraphasic errors since her hospitalization. She had a mild diffuse headache for a couple of days but this has resolved. She denies any dizziness, dysarthria/dysphagia, neck/back pain, bowel/bladder dysfunction.   Records from her hospitalization in August 2016 were reviewed. She has been seeing neurologist Dr. Leonie Man for post-stroke and seizure care since then, last visit was in August  2042. She had a severe headache for several days in August 2016 2 weeks post-partum. She then developed right-sided weakness, aphasia, then a focal seizure in the hospital. She was found to have a large left parietal ICH with IVH, cerebral edema, obstructive hydrocephalus and subarachnoid hemorrhage. MRA and MRV were unremarkable. Concern was for  cerebral cortical vein thrombosis not seen on MRV, however RCVS was also a consideration. She was treated with hypertonic saline and stabilized. She had a seizure with EEG showing left PLEDs. She was started on Keppra. Hypercoagulable labs were normal. She was discharged to rehab and had another seizure on 06/01/16 with report of eyes rolling back and upper extremities shaking. Keppra dose was increased. She had a repeat MRI brain in September 2016 with resolving subacute left parietal hematoma. There was acute gyral swelling and T2 hyperintensity in the left hippocampal formation, left insula, and operculum, that resolved with repeat MRI 09/2015 showing resolution of these changes. She was initially having some headaches and low dose Topamax was briefly added, then with resolution of headaches, this was tapered off. She had been dealing with post-stroke depression and symptoms had improved, however with limitations on driving again, she is starting to feel depressed again. She has three young children, the youngest is 52 months old. She was not happy with neuro rehab and has a private physical therapist coming weekly where she pushes herself and has made a ton of improvement. She does not like to wear her AFO and her husband did not notice any difference in gait with the AFO.    Epilepsy Risk Factors:  History of large left parietal hematoma with encephalomalacia. Otherwise she had a normal birth and early development.  There is no history of febrile convulsions, CNS infections such as meningitis/encephalitis, significant traumatic brain injury, neurosurgical procedures, or family history of seizures.   Diagnostic Data: I personally reviewed MRI brain 11/26/2016 which did not show any acute changes. There was left paramedian parietal encephalomalacia with extensive hemosiderin staining extending into the splenium of the corpus callosum, ex vacuo dilatation of the left lateral ventricle occipital horn, increased  side of the temporal horn of the left lateral ventricle which may be due to decreased volume of the hippocampus. It is smaller in size with increased signal compared to the right. EEG 11/27/16 showed mild left hemispheric slowing EEG 05/22/15: left hemisphere slowing, rare epileptiform discharges over the left anterior temporal region EEG 05/21/15: PLEDs on the left hemisphere, mainly involving the temporo-parietal region   Prior AEDs: Topamax, Vimpat, oxcarbazepine   PAST MEDICAL HISTORY: Past Medical History:  Diagnosis Date   Aphasia following nontraumatic intracerebral hemorrhage 05/28/2015   Left parietal ICH Aug 2016    Cerebral venous thrombosis of cortical vein    Contracture of muscle ankle and foot 06/11/2015   Right heel cord    Cytotoxic cerebral edema    Generalized anxiety disorder 05/28/2015   Hypotension 06/02/2015   ICH (intracerebral hemorrhage)    IVH (intraventricular hemorrhage)    Left-sided intracerebral hemorrhage 06/04/2015   Major depressive disorder 03/23/2017   Right spastic hemiparesis 05/28/2015   Left ICH   Seizure disorder, nonconvulsive, with status epilepticus    last seizure early 2021   Sepsis 06/03/2015   UTI (urinary tract infection) 06/03/2015    MEDICATIONS: Current Outpatient Medications on File Prior to Visit  Medication Sig Dispense Refill   aspirin 81 MG chewable tablet Chew 81 mg by mouth daily.     Cholecalciferol (VITAMIN D) 2000  units CAPS Take 2,000 Units by mouth daily.     lamoTRIgine (LAMICTAL) 200 MG tablet TAKE 1 TABLET(200 MG) BY MOUTH TWICE DAILY 120 tablet 0   levETIRAcetam (KEPPRA) 1000 MG tablet Take 2 tablets (2,000 mg total) by mouth 2 (two) times daily. 360 tablet 1   linaclotide (LINZESS) 145 MCG CAPS capsule Take 290 mcg by mouth daily before breakfast.     LORazepam (ATIVAN) 1 MG tablet TAKE 1 TABLET BY MOUTH AS NEEDED SEIZURES 10 tablet 5   magnesium oxide (MAG-OX) 400 MG tablet Take 400 mg by mouth 2 (two) times daily.      mirtazapine (REMERON) 45 MG tablet SMARTSIG:1 Tablet(s) By Mouth Every Evening     Multiple Vitamin (MULTIVITAMIN) tablet Take 1 tablet by mouth daily.      REXULTI 0.5 MG TABS Take 1 tablet by mouth daily.     Vilazodone HCl (VIIBRYD) 40 MG TABS Take 40 mg by mouth every morning.     No current facility-administered medications on file prior to visit.    ALLERGIES: No Known Allergies  FAMILY HISTORY: Family History  Problem Relation Age of Onset   Cancer Mother    Cancer Father        pancreatic   Colon cancer Neg Hx    Colon polyps Neg Hx    Esophageal cancer Neg Hx    Rectal cancer Neg Hx    Stomach cancer Neg Hx     SOCIAL HISTORY: Social History   Socioeconomic History   Marital status: Married    Spouse name: Not on file   Number of children: Not on file   Years of education: 16   Highest education level: Bachelor's degree (e.g., BA, AB, BS)  Occupational History   Not on file  Tobacco Use   Smoking status: Never   Smokeless tobacco: Never  Vaping Use   Vaping Use: Never used  Substance and Sexual Activity   Alcohol use: Yes    Comment: wine 1-2 times a month per pt   Drug use: No   Sexual activity: Yes    Birth control/protection: None    Comment: husband has vasectomy  Other Topics Concern   Not on file  Social History Narrative   Lives with husband and three kids in two story home      Right handed      Highest level of edu- bachelors   Social Determinants of Health   Financial Resource Strain: Not on file  Food Insecurity: Not on file  Transportation Needs: Not on file  Physical Activity: Not on file  Stress: Not on file  Social Connections: Not on file  Intimate Partner Violence: Not on file     PHYSICAL EXAM: Vitals:   06/13/21 0956  BP: (!) 91/56  Pulse: 74  SpO2: 95%   General: No acute distress, tired-appearing Head:  Normocephalic/atraumatic Skin/Extremities: No rash, no edema Neurological Exam: alert and oriented to  person, place, and time. No aphasia or dysarthria. Fund of knowledge is appropriate.  Recent and remote memory are intact.  Attention and concentration are normal.   Cranial nerves: Pupils equal, round. Extraocular movements intact with no nystagmus. Visual fields full.  No facial asymmetry.  Motor: Bulk and tone normal, muscle strength 5/5 except for right foot drop with right AFO. Finger to nose testing intact.  Gait spastic hemiparetic (unchanged)   IMPRESSION: This is a pleasant 45 yo RH woman with a history of large left ICH and focal seizures  in August 2016 with residual right foot drop. Her last GTC was in 06/2018. Levetiracetam was increased to '2000mg'$  BID in May 2022 due to episodes of funny feeling with internal sensation of jerking (family would not notice the jerking). The jerking sensations have quieted down but she still has occasional "funny feelings" that may be anxiety-related. Continue Levetiracetam '2000mg'$  BID and Lamotrigine '200mg'$  BID. She is reporting more moodiness that started after [place], continue follow-up with Psychiatry. We discussed how levetiracetam can also cause moodiness, we may consider other ASMs on her next visit. We again discussed [place] driving laws to stop driving after an episode of loss of awareness until 6 months seizure-free. Follow-up in 4 months, she knows to call for any changes.    Thank you for allowing me to participate in her care.  Please do not hesitate to call for any questions or concerns.    Ellouise Newer, M.D.   CC: Dr. Brigitte Pulse

## 2021-06-13 NOTE — Patient Instructions (Signed)
Good to see you!  Continue Keppra '1000mg'$ : take 2 tablets twice a day  2. Continue Lamotrigine '200mg'$ : Take 1 tablet twice a day  3. Continue follow-up with Psychiatry as scheduled  4. Follow-up in 4-5 months, call for any changes   Seizure Precautions: 1. If medication has been prescribed for you to prevent seizures, take it exactly as directed.  Do not stop taking the medicine without talking to your doctor first, even if you have not had a seizure in a long time.   2. Avoid activities in which a seizure would cause danger to yourself or to others.  Don't operate dangerous machinery, swim alone, or climb in high or dangerous places, such as on ladders, roofs, or girders.  Do not drive unless your doctor says you may.  3. If you have any warning that you may have a seizure, lay down in a safe place where you can't hurt yourself.    4.  No driving for 6 months from last seizure, as per [facility].   Please refer to the following link on the [place] website for more information: http://[email or web address]/[email or web address]/driving/drivingu.cfm   5.  Maintain good sleep hygiene. Avoid alcohol.  6.  Notify your neurology if you are planning pregnancy or if you become pregnant.  7.  Contact your doctor if you have any problems that may be related to the medicine you are taking.  8.  Call 911 and bring the patient back to the ED if:        A.  The seizure lasts longer than 5 minutes.       B.  The patient doesn't awaken shortly after the seizure  C.  The patient has new problems such as difficulty seeing, speaking or moving  D.  The patient was injured during the seizure  E.  The patient has a temperature over 102 F (39C)  F.  The patient vomited and now is having trouble breathing

## 2021-07-09 DIAGNOSIS — F331 Major depressive disorder, recurrent, moderate: Secondary | ICD-10-CM | POA: Diagnosis not present

## 2021-07-09 DIAGNOSIS — F419 Anxiety disorder, unspecified: Secondary | ICD-10-CM | POA: Diagnosis not present

## 2021-08-03 ENCOUNTER — Other Ambulatory Visit: Payer: Self-pay | Admitting: Neurology

## 2021-08-15 DIAGNOSIS — Z1231 Encounter for screening mammogram for malignant neoplasm of breast: Secondary | ICD-10-CM | POA: Diagnosis not present

## 2021-08-28 DIAGNOSIS — R7989 Other specified abnormal findings of blood chemistry: Secondary | ICD-10-CM | POA: Diagnosis not present

## 2021-08-28 DIAGNOSIS — Z79899 Other long term (current) drug therapy: Secondary | ICD-10-CM | POA: Diagnosis not present

## 2021-08-28 DIAGNOSIS — F419 Anxiety disorder, unspecified: Secondary | ICD-10-CM | POA: Diagnosis not present

## 2021-09-04 DIAGNOSIS — R109 Unspecified abdominal pain: Secondary | ICD-10-CM | POA: Diagnosis not present

## 2021-09-04 DIAGNOSIS — G3184 Mild cognitive impairment, so stated: Secondary | ICD-10-CM | POA: Diagnosis not present

## 2021-09-04 DIAGNOSIS — Z803 Family history of malignant neoplasm of breast: Secondary | ICD-10-CM | POA: Diagnosis not present

## 2021-09-04 DIAGNOSIS — F324 Major depressive disorder, single episode, in partial remission: Secondary | ICD-10-CM | POA: Diagnosis not present

## 2021-09-04 DIAGNOSIS — I69320 Aphasia following cerebral infarction: Secondary | ICD-10-CM | POA: Diagnosis not present

## 2021-09-04 DIAGNOSIS — R82998 Other abnormal findings in urine: Secondary | ICD-10-CM | POA: Diagnosis not present

## 2021-09-04 DIAGNOSIS — G40909 Epilepsy, unspecified, not intractable, without status epilepticus: Secondary | ICD-10-CM | POA: Diagnosis not present

## 2021-09-04 DIAGNOSIS — K5900 Constipation, unspecified: Secondary | ICD-10-CM | POA: Diagnosis not present

## 2021-09-04 DIAGNOSIS — Z0000 Encounter for general adult medical examination without abnormal findings: Secondary | ICD-10-CM | POA: Diagnosis not present

## 2021-09-04 DIAGNOSIS — Z1331 Encounter for screening for depression: Secondary | ICD-10-CM | POA: Diagnosis not present

## 2021-09-04 DIAGNOSIS — F419 Anxiety disorder, unspecified: Secondary | ICD-10-CM | POA: Diagnosis not present

## 2021-09-04 DIAGNOSIS — Z1339 Encounter for screening examination for other mental health and behavioral disorders: Secondary | ICD-10-CM | POA: Diagnosis not present

## 2021-09-04 DIAGNOSIS — I69351 Hemiplegia and hemiparesis following cerebral infarction affecting right dominant side: Secondary | ICD-10-CM | POA: Diagnosis not present

## 2021-09-09 DIAGNOSIS — F331 Major depressive disorder, recurrent, moderate: Secondary | ICD-10-CM | POA: Diagnosis not present

## 2021-09-09 DIAGNOSIS — F419 Anxiety disorder, unspecified: Secondary | ICD-10-CM | POA: Diagnosis not present

## 2021-10-17 ENCOUNTER — Ambulatory Visit: Payer: Medicare Other | Admitting: Neurology

## 2021-10-24 ENCOUNTER — Other Ambulatory Visit: Payer: Self-pay

## 2021-10-24 ENCOUNTER — Encounter: Payer: Self-pay | Admitting: Neurology

## 2021-10-24 ENCOUNTER — Ambulatory Visit (INDEPENDENT_AMBULATORY_CARE_PROVIDER_SITE_OTHER): Payer: Medicare Other | Admitting: Neurology

## 2021-10-24 VITALS — BP 95/60 | HR 77 | Resp 18 | Ht 63.0 in | Wt 118.0 lb

## 2021-10-24 DIAGNOSIS — G40109 Localization-related (focal) (partial) symptomatic epilepsy and epileptic syndromes with simple partial seizures, not intractable, without status epilepticus: Secondary | ICD-10-CM | POA: Diagnosis not present

## 2021-10-24 MED ORDER — LAMOTRIGINE 200 MG PO TABS: ORAL_TABLET | ORAL | 3 refills | Status: DC

## 2021-10-24 MED ORDER — LEVETIRACETAM 1000 MG PO TABS: 2000.0000 mg | ORAL_TABLET | Freq: Two times a day (BID) | ORAL | 3 refills | Status: DC

## 2021-10-24 NOTE — Patient Instructions (Signed)
Always good to see you. Continue all your medications. Discuss nausea with your PCP and anxiety with your psychiatrist. Follow-up in 6 months, call for any changes.    Seizure Precautions: 1. If medication has been prescribed for you to prevent seizures, take it exactly as directed.  Do not stop taking the medicine without talking to your doctor first, even if you have not had a seizure in a long time.   2. Avoid activities in which a seizure would cause danger to yourself or to others.  Don't operate dangerous machinery, swim alone, or climb in high or dangerous places, such as on ladders, roofs, or girders.  Do not drive unless your doctor says you may.  3. If you have any warning that you may have a seizure, lay down in a safe place where you can't hurt yourself.    4.  No driving for 6 months from last seizure, as per [facility].   Please refer to the following link on the [place] website for more information: http://[email or web address]/[email or web address]/driving/drivingu.cfm   5.  Maintain good sleep hygiene. Avoid alcohol.  6.  Contact your doctor if you have any problems that may be related to the medicine you are taking.  7.  Call 911 and bring the patient back to the ED if:        A.  The seizure lasts longer than 5 minutes.       B.  The patient doesn't awaken shortly after the seizure  C.  The patient has new problems such as difficulty seeing, speaking or moving  D.  The patient was injured during the seizure  E.  The patient has a temperature over 102 F (39C)  F.  The patient vomited and now is having trouble breathing

## 2021-10-24 NOTE — Progress Notes (Signed)
NEUROLOGY FOLLOW UP OFFICE NOTE  Ashley [name] [id number] Jun 05, 1976  HISTORY OF PRESENT ILLNESS: I had the pleasure of seeing Ashley [name] in follow-up in the neurology clinic on 10/24/2021.  The patient was last seen 4 months ago for seizures secondary to stroke. She is alone in the office today. Her last medication change was in May 2022 with an increase of Levetiracetam to 2000mg  BID due to spells where she felt like an internal sensation like she was jerking all over. She is also on Lamotrigine 200mg  BID. On her last visit, she reported more mood changes. Since her last visit, she reports doing well, no GTCs since 2019. The internal sensation of jerking has not been bothering her. She has had a couple of spells of anxiety where she "zoned out for a minute," her heart rate goes up a little. They are mostly triggered by external events, she takes a deep breath and it resolves. She denies any staring/unresponsive episodes. No olfactory/gustatory hallucinations, focal numbness/tingling, no significant headaches, dizziness, no falls. Sleep is good. Mood is "okay, off and on." She has been having intermittent nausea for the past 3 weeks, no vomiting. She feels she has lost some weight.    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 She had focal seizures in 2016 with EEG showing left PLEDs and has been on Keppra 1500mg  BID since then with no further focal motor seizures, however she would have occasional episodes around once a month where she could not get her words out, lasting 3-4 minutes. Sometimes she would get hot and have to sit but her speech would not be affected. On 11/27/16, she started having the same speech difficulties where she was making paraphasic errors, but it continued for 30 minutes and they decided to go to the ER. In the ER, she was noted  to be aphasic, then as she was having a head CT, she suddenly had right gaze and head deviation, right arm and leg flexion and shaking for around 3 minutes. She was given IV Ativan but her right leg continued to shake in a rhythmic fashion. She was given additional Keppra and started on Vimpat with no further seizures, back to baseline in a couple of hours. She initially had some difficulties with the Vimpat 100mg  BID where she had hallucinations of hearing music or voices, but these have resolved over the past week. She denies any side effects on Keppra 1500mg  BID. She denies any dizziness, diplopia, gait instability. She has a right foot drop, denies any numbness/tingling on the right side but states it feels different that the other side. She denies any olfactory/gustatory hallucinations, myoclonic jerks, episodes of staring/unresponsiveness, gaps in time. She has not had any further episodes of paraphasic errors since her hospitalization. She had a mild diffuse headache for a couple of days but this has resolved. She denies any dizziness, dysarthria/dysphagia, neck/back pain, bowel/bladder dysfunction.   Records from her hospitalization in August 2016 were reviewed. She has been seeing neurologist Dr. Leonie Man for post-stroke and seizure care since then, last visit was in August 2017. She had a severe headache for several days in August 2016 2 weeks post-partum. She then developed right-sided weakness, aphasia, then a focal seizure in the hospital. She was found to have a large left parietal ICH with IVH, cerebral edema, obstructive hydrocephalus and subarachnoid hemorrhage.  MRA and MRV were unremarkable. Concern was for cerebral cortical vein thrombosis not seen on MRV, however RCVS was also a consideration. She was treated with hypertonic saline and stabilized. She had a seizure with EEG showing left PLEDs. She was started on Keppra. Hypercoagulable labs were normal. She was discharged to rehab and had another  seizure on 06/01/16 with report of eyes rolling back and upper extremities shaking. Keppra dose was increased. She had a repeat MRI brain in September 2016 with resolving subacute left parietal hematoma. There was acute gyral swelling and T2 hyperintensity in the left hippocampal formation, left insula, and operculum, that resolved with repeat MRI 09/2015 showing resolution of these changes. She was initially having some headaches and low dose Topamax was briefly added, then with resolution of headaches, this was tapered off. She had been dealing with post-stroke depression and symptoms had improved, however with limitations on driving again, she is starting to feel depressed again. She has three young children, the youngest is 31 months old. She was not happy with neuro rehab and has a private physical therapist coming weekly where she pushes herself and has made a ton of improvement. She does not like to wear her AFO and her husband did not notice any difference in gait with the AFO.    Epilepsy Risk Factors:  History of large left parietal hematoma with encephalomalacia. Otherwise she had a normal birth and early development.  There is no history of febrile convulsions, CNS infections such as meningitis/encephalitis, significant traumatic brain injury, neurosurgical procedures, or family history of seizures.   Diagnostic Data: I personally reviewed MRI brain 11/26/2016 which did not show any acute changes. There was left paramedian parietal encephalomalacia with extensive hemosiderin staining extending into the splenium of the corpus callosum, ex vacuo dilatation of the left lateral ventricle occipital horn, increased side of the temporal horn of the left lateral ventricle which may be due to decreased volume of the hippocampus. It is smaller in size with increased signal compared to the right. EEG 11/27/16 showed mild left hemispheric slowing EEG 05/22/15: left hemisphere slowing, rare epileptiform discharges  over the left anterior temporal region EEG 05/21/15: PLEDs on the left hemisphere, mainly involving the temporo-parietal region   Prior AEDs: Topamax, Vimpat, oxcarbazepine   PAST MEDICAL HISTORY: Past Medical History:  Diagnosis Date   Aphasia following nontraumatic intracerebral hemorrhage 05/28/2015   Left parietal ICH Aug 2016    Cerebral venous thrombosis of cortical vein    Contracture of muscle ankle and foot 06/11/2015   Right heel cord    Cytotoxic cerebral edema    Generalized anxiety disorder 05/28/2015   Hypotension 06/02/2015   ICH (intracerebral hemorrhage)    IVH (intraventricular hemorrhage)    Left-sided intracerebral hemorrhage 06/04/2015   Major depressive disorder 03/23/2017   Right spastic hemiparesis 05/28/2015   Left ICH   Seizure disorder, nonconvulsive, with status epilepticus    last seizure early 2021   Sepsis 06/03/2015   UTI (urinary tract infection) 06/03/2015    MEDICATIONS: Current Outpatient Medications on File Prior to Visit  Medication Sig Dispense Refill   aspirin 81 MG chewable tablet Chew 81 mg by mouth daily.     Cholecalciferol (VITAMIN D) 2000 units CAPS Take 2,000 Units by mouth daily.     lamoTRIgine (LAMICTAL) 200 MG tablet TAKE 1 TABLET(200 MG) BY MOUTH TWICE DAILY 120 tablet 0   levETIRAcetam (KEPPRA) 1000 MG tablet Take 2 tablets (2,000 mg total) by mouth 2 (two) times daily.  360 tablet 3   linaclotide (LINZESS) 145 MCG CAPS capsule Take 290 mcg by mouth daily before breakfast.     LORazepam (ATIVAN) 1 MG tablet TAKE 1 TABLET BY MOUTH AS NEEDED SEIZURES 10 tablet 5   magnesium oxide (MAG-OX) 400 MG tablet Take 400 mg by mouth 2 (two) times daily.     mirtazapine (REMERON) 45 MG tablet SMARTSIG:1 Tablet(s) By Mouth Every Evening     Multiple Vitamin (MULTIVITAMIN) tablet Take 1 tablet by mouth daily.      REXULTI 0.5 MG TABS Take 1 tablet by mouth daily.     Vilazodone HCl (VIIBRYD) 40 MG TABS Take 40 mg by mouth every morning.     No  current facility-administered medications on file prior to visit.    ALLERGIES: No Known Allergies  FAMILY HISTORY: Family History  Problem Relation Age of Onset   Cancer Mother    Cancer Father        pancreatic   Colon cancer Neg Hx    Colon polyps Neg Hx    Esophageal cancer Neg Hx    Rectal cancer Neg Hx    Stomach cancer Neg Hx     SOCIAL HISTORY: Social History   Socioeconomic History   Marital status: Married    Spouse name: Not on file   Number of children: Not on file   Years of education: 16   Highest education level: Bachelor's degree (e.g., BA, AB, BS)  Occupational History   Not on file  Tobacco Use   Smoking status: Never   Smokeless tobacco: Never  Vaping Use   Vaping Use: Never used  Substance and Sexual Activity   Alcohol use: Yes    Comment: wine 1-2 times a month per pt   Drug use: No   Sexual activity: Yes    Birth control/protection: None    Comment: husband has vasectomy  Other Topics Concern   Not on file  Social History Narrative   Lives with husband and three kids in two story home      Right handed      Highest level of edu- bachelors   Social Determinants of Health   Financial Resource Strain: Not on file  Food Insecurity: Not on file  Transportation Needs: Not on file  Physical Activity: Not on file  Stress: Not on file  Social Connections: Not on file  Intimate Partner Violence: Not on file     PHYSICAL EXAM: Vitals:   10/24/21 1227  BP: 95/60  Pulse: 77  Resp: 18  SpO2: 96%   General: No acute distress Head:  Normocephalic/atraumatic Skin/Extremities: No rash, no edema Neurological Exam: alert and awake. No aphasia or dysarthria. Fund of knowledge is appropriate.  Attention and concentration are normal.   Cranial nerves: Pupils equal, round. Extraocular movements intact with no nystagmus. Visual fields full.  No facial asymmetry.  Motor: increased tone on right with 5/5 strength except for right foot drop with  AFO. Finger to nose testing intact. Gait spastic hemiparetic (unchanged).   IMPRESSION: This is a pleasant 46 yo RH woman with a history of large left ICH and focal seizures in August 2016 with residual right foot drop. Her last GTC was in 06/2018. She reports overall doing well on Levetiracetam 2000mg  BID and Lamotrigine 200mg  BID, refills sent. She will discuss nausea with PCP and anxiety with her psychiatrist. She is aware of [place] driving laws to stop driving after a seizure until 6 months seizure-free. Follow-up in 6  months, call for any changes.   Thank you for allowing me to participate in her care.  Please do not hesitate to call for any questions or concerns.    Ashley [name], M.D.   CC: Dr. Brigitte Pulse

## 2021-11-26 ENCOUNTER — Encounter: Payer: Self-pay | Admitting: Neurology

## 2021-11-26 MED ORDER — LORAZEPAM 1 MG PO TABS: ORAL_TABLET | ORAL | 5 refills | Status: DC

## 2021-12-05 DIAGNOSIS — F419 Anxiety disorder, unspecified: Secondary | ICD-10-CM | POA: Diagnosis not present

## 2021-12-05 DIAGNOSIS — F331 Major depressive disorder, recurrent, moderate: Secondary | ICD-10-CM | POA: Diagnosis not present

## 2021-12-13 DIAGNOSIS — L700 Acne vulgaris: Secondary | ICD-10-CM | POA: Diagnosis not present

## 2021-12-24 ENCOUNTER — Encounter: Payer: Self-pay | Admitting: Neurology

## 2021-12-24 ENCOUNTER — Ambulatory Visit (INDEPENDENT_AMBULATORY_CARE_PROVIDER_SITE_OTHER): Payer: Medicare Other | Admitting: Neurology

## 2021-12-24 ENCOUNTER — Other Ambulatory Visit: Payer: Self-pay

## 2021-12-24 VITALS — BP 98/52 | HR 88 | Ht 63.0 in | Wt 120.0 lb

## 2021-12-24 DIAGNOSIS — G40109 Localization-related (focal) (partial) symptomatic epilepsy and epileptic syndromes with simple partial seizures, not intractable, without status epilepticus: Secondary | ICD-10-CM

## 2021-12-24 NOTE — Progress Notes (Signed)
? ?NEUROLOGY FOLLOW UP OFFICE NOTE ? ?Shakaya Bhullar ?[id number] ?July 19, 1976 ? ?HISTORY OF PRESENT ILLNESS: ?I had the pleasure of seeing Gaynor Genco in follow-up in the neurology clinic on 12/24/2021.  The patient was last seen 2 months ago for seizures secondary to stroke. She is accompanied by her mother who helps supplement the history today. She is seen for an earlier visit today per her request via [facility] stating she is in a bad situation with her husband and needs a medical diagnosis and help with her medication as soon as possible. She is on Lamotrigine $RemoveBefore'200mg'CLxuoZvlnfiKJ$  BID and Levetiracetam $RemoveBeforeDEI'2000mg'ZKdgHybHIDsXVgXM$  BID. She denies any GTCs since 2019. She is not sure when she has "panic attacks," she thinks they are more related to the significant amount of stress she is currently undergoing with her divorce proceedings. She is asking for clarification for her cognitive deficits, she had Neuropsychological testing with Dr. Melvyn Novas in March 2021 where there was note of significant impairments in learning and later recalling previously learned verbal information. There were also weakness in tasks assessing visuoperceptual executive dysfunction, bilateral frontal motor impairments, and semantic fluency. She met criteria for Mild Neurocognitive Disorder with primary culprit being her history of intracranial hemorrhage and seizure also contributing. She also had moderate levels of anxiety and severe rates of depression which can influence cognitive efficiency. ? ? ? ?History on Initial Assessment 12/10/2016: This is a pleasant 46 yo RH woman with a history of left parietal ICH in 2016 with residual right foot drop, admitted February 14-15, 2018 for focal seizures. She had focal seizures in 2016 with EEG showing left PLEDs and has been on Keppra $RemoveB'1500mg'jFsBrPQI$  BID since then with no further focal motor seizures, however she would have occasional episodes around once a month where she could not get her words out, lasting 3-4 minutes. Sometimes she would  get hot and have to sit but her speech would not be affected. On 11/27/16, she started having the same speech difficulties where she was making paraphasic errors, but it continued for 30 minutes and they decided to go to the ER. In the ER, she was noted to be aphasic, then as she was having a head CT, she suddenly had right gaze and head deviation, right arm and leg flexion and shaking for around 3 minutes. She was given IV Ativan but her right leg continued to shake in a rhythmic fashion. She was given additional Keppra and started on Vimpat with no further seizures, back to baseline in a couple of hours. She initially had some difficulties with the Vimpat $RemoveBe'100mg'wWefAvlpw$  BID where she had hallucinations of hearing music or voices, but these have resolved over the past week. She denies any side effects on Keppra $RemoveB'1500mg'DLytrANG$  BID. She denies any dizziness, diplopia, gait instability. She has a right foot drop, denies any numbness/tingling on the right side but states it feels different that the other side. She denies any olfactory/gustatory hallucinations, myoclonic jerks, episodes of staring/unresponsiveness, gaps in time. She has not had any further episodes of paraphasic errors since her hospitalization. She had a mild diffuse headache for a couple of days but this has resolved. She denies any dizziness, dysarthria/dysphagia, neck/back pain, bowel/bladder dysfunction. ?  ?Records from her hospitalization in August 2016 were reviewed. She has been seeing neurologist Dr. Leonie Man for post-stroke and seizure care since then, last visit was in August 2017. She had a severe headache for several days in August 2016 2 weeks post-partum. She then developed right-sided weakness, aphasia, then a  focal seizure in the hospital. She was found to have a large left parietal ICH with IVH, cerebral edema, obstructive hydrocephalus and subarachnoid hemorrhage. MRA and MRV were unremarkable. Concern was for cerebral cortical vein thrombosis not seen  on MRV, however RCVS was also a consideration. She was treated with hypertonic saline and stabilized. She had a seizure with EEG showing left PLEDs. She was started on Keppra. Hypercoagulable labs were normal. She was discharged to rehab and had another seizure on 06/01/16 with report of eyes rolling back and upper extremities shaking. Keppra dose was increased. She had a repeat MRI brain in September 2016 with resolving subacute left parietal hematoma. There was acute gyral swelling and T2 hyperintensity in the left hippocampal formation, left insula, and operculum, that resolved with repeat MRI 09/2015 showing resolution of these changes. She was initially having some headaches and low dose Topamax was briefly added, then with resolution of headaches, this was tapered off. She had been dealing with post-stroke depression and symptoms had improved, however with limitations on driving again, she is starting to feel depressed again. She has three young children, the youngest is 67 months old. She was not happy with neuro rehab and has a private physical therapist coming weekly where she pushes herself and has made a ton of improvement. She does not like to wear her AFO and her husband did not notice any difference in gait with the AFO.  ?  ?Epilepsy Risk Factors:  History of large left parietal hematoma with encephalomalacia. Otherwise she had a normal birth and early development.  There is no history of febrile convulsions, CNS infections such as meningitis/encephalitis, significant traumatic brain injury, neurosurgical procedures, or family history of seizures. ?  ?Diagnostic Data: I personally reviewed MRI brain 11/26/2016 which did not show any acute changes. There was left paramedian parietal encephalomalacia with extensive hemosiderin staining extending into the splenium of the corpus callosum, ex vacuo dilatation of the left lateral ventricle occipital horn, increased side of the temporal horn of the left  lateral ventricle which may be due to decreased volume of the hippocampus. It is smaller in size with increased signal compared to the right. ?EEG 11/27/16 showed mild left hemispheric slowing ?EEG 05/22/15: left hemisphere slowing, rare epileptiform discharges over the left anterior temporal region ?EEG 05/21/15: PLEDs on the left hemisphere, mainly involving the temporo-parietal region ?  ?Prior AEDs: Topamax, Vimpat, oxcarbazepine ? ? ?PAST MEDICAL HISTORY: ?Past Medical History:  ?Diagnosis Date  ? Aphasia following nontraumatic intracerebral hemorrhage 05/28/2015  ? Left parietal ICH Aug 2016   ? Cerebral venous thrombosis of cortical vein   ? Contracture of muscle ankle and foot 06/11/2015  ? Right heel cord   ? Cytotoxic cerebral edema   ? Generalized anxiety disorder 05/28/2015  ? Hypotension 06/02/2015  ? ICH (intracerebral hemorrhage)   ? IVH (intraventricular hemorrhage)   ? Left-sided intracerebral hemorrhage 06/04/2015  ? Major depressive disorder 03/23/2017  ? Right spastic hemiparesis 05/28/2015  ? Left ICH  ? Seizure disorder, nonconvulsive, with status epilepticus   ? last seizure early 2021  ? Sepsis 06/03/2015  ? UTI (urinary tract infection) 06/03/2015  ? ? ?MEDICATIONS: ?Current Outpatient Medications on File Prior to Visit  ?Medication Sig Dispense Refill  ? aspirin 81 MG chewable tablet Chew 81 mg by mouth daily.    ? Cholecalciferol (VITAMIN D) 2000 units CAPS Take 2,000 Units by mouth daily.    ? lamoTRIgine (LAMICTAL) 200 MG tablet TAKE 1 TABLET(200 MG) BY MOUTH  TWICE DAILY 180 tablet 3  ? levETIRAcetam (KEPPRA) 1000 MG tablet Take 2 tablets (2,000 mg total) by mouth 2 (two) times daily. 360 tablet 3  ? linaclotide (LINZESS) 145 MCG CAPS capsule Take 290 mcg by mouth daily before breakfast.    ? LORazepam (ATIVAN) 1 MG tablet TAKE 1 TABLET BY MOUTH AS NEEDED SEIZURES 10 tablet 5  ? magnesium oxide (MAG-OX) 400 MG tablet Take 400 mg by mouth 2 (two) times daily.    ? mirtazapine (REMERON) 45 MG tablet  SMARTSIG:1 Tablet(s) By Mouth Every Evening    ? Multiple Vitamin (MULTIVITAMIN) tablet Take 1 tablet by mouth daily.     ? REXULTI 0.5 MG TABS Take 1 tablet by mouth daily.    ? Vilazodone HCl (VIIBRYD) 40

## 2021-12-24 NOTE — Patient Instructions (Signed)
Wishing you the best of luck. Continue all your medications. Would consider seeing a therapist, let me know if you need a referral. Follow-up in August, call for any changes ? ? ?Seizure Precautions: ?1. If medication has been prescribed for you to prevent seizures, take it exactly as directed.  Do not stop taking the medicine without talking to your doctor first, even if you have not had a seizure in a long time.  ? ?2. Avoid activities in which a seizure would cause danger to yourself or to others.  Don't operate dangerous machinery, swim alone, or climb in high or dangerous places, such as on ladders, roofs, or girders.  Do not drive unless your doctor says you may. ? ?3. If you have any warning that you may have a seizure, lay down in a safe place where you can't hurt yourself.   ? ?4.  No driving for 6 months from last seizure, as per [facility].   Please refer to the following link on the [place] website for more information: http://www.epilepsyfoundation.org/answerplace/Social/driving/drivingu.cfm  ? ?5.  Maintain good sleep hygiene. Avoid alcohol. ? ?6.  Notify your neurology if you are planning pregnancy or if you become pregnant. ? ?7.  Contact your doctor if you have any problems that may be related to the medicine you are taking. ? ?8.  Call 911 and bring the patient back to the ED if: ?      ? A.  The seizure lasts longer than 5 minutes.      ? B.  The patient doesn't awaken shortly after the seizure ? C.  The patient has new problems such as difficulty seeing, speaking or moving ? D.  The patient was injured during the seizure ? E.  The patient has a temperature over 102 F (39C) ? F.  The patient vomited and now is having trouble breathing ?      ? ?

## 2022-02-10 DIAGNOSIS — Z79899 Other long term (current) drug therapy: Secondary | ICD-10-CM | POA: Diagnosis not present

## 2022-02-10 DIAGNOSIS — L700 Acne vulgaris: Secondary | ICD-10-CM | POA: Diagnosis not present

## 2022-02-10 DIAGNOSIS — D225 Melanocytic nevi of trunk: Secondary | ICD-10-CM | POA: Diagnosis not present

## 2022-02-10 DIAGNOSIS — L821 Other seborrheic keratosis: Secondary | ICD-10-CM | POA: Diagnosis not present

## 2022-02-10 DIAGNOSIS — Z85828 Personal history of other malignant neoplasm of skin: Secondary | ICD-10-CM | POA: Diagnosis not present

## 2022-03-05 DIAGNOSIS — F419 Anxiety disorder, unspecified: Secondary | ICD-10-CM | POA: Diagnosis not present

## 2022-03-05 DIAGNOSIS — R110 Nausea: Secondary | ICD-10-CM | POA: Diagnosis not present

## 2022-03-05 DIAGNOSIS — F331 Major depressive disorder, recurrent, moderate: Secondary | ICD-10-CM | POA: Diagnosis not present

## 2022-03-05 DIAGNOSIS — R109 Unspecified abdominal pain: Secondary | ICD-10-CM | POA: Diagnosis not present

## 2022-03-17 DIAGNOSIS — R109 Unspecified abdominal pain: Secondary | ICD-10-CM | POA: Diagnosis not present

## 2022-03-20 ENCOUNTER — Encounter: Payer: Self-pay | Admitting: Neurology

## 2022-03-25 DIAGNOSIS — R109 Unspecified abdominal pain: Secondary | ICD-10-CM | POA: Diagnosis not present

## 2022-04-01 DIAGNOSIS — R109 Unspecified abdominal pain: Secondary | ICD-10-CM | POA: Diagnosis not present

## 2022-04-07 DIAGNOSIS — R109 Unspecified abdominal pain: Secondary | ICD-10-CM | POA: Diagnosis not present

## 2022-04-16 DIAGNOSIS — R109 Unspecified abdominal pain: Secondary | ICD-10-CM | POA: Diagnosis not present

## 2022-04-23 DIAGNOSIS — R109 Unspecified abdominal pain: Secondary | ICD-10-CM | POA: Diagnosis not present

## 2022-05-08 DIAGNOSIS — F419 Anxiety disorder, unspecified: Secondary | ICD-10-CM | POA: Diagnosis not present

## 2022-05-08 DIAGNOSIS — R109 Unspecified abdominal pain: Secondary | ICD-10-CM | POA: Diagnosis not present

## 2022-05-08 DIAGNOSIS — F331 Major depressive disorder, recurrent, moderate: Secondary | ICD-10-CM | POA: Diagnosis not present

## 2022-05-14 DIAGNOSIS — L700 Acne vulgaris: Secondary | ICD-10-CM | POA: Diagnosis not present

## 2022-05-14 DIAGNOSIS — M79642 Pain in left hand: Secondary | ICD-10-CM | POA: Diagnosis not present

## 2022-05-14 DIAGNOSIS — S6992XA Unspecified injury of left wrist, hand and finger(s), initial encounter: Secondary | ICD-10-CM | POA: Diagnosis not present

## 2022-05-27 DIAGNOSIS — R109 Unspecified abdominal pain: Secondary | ICD-10-CM | POA: Diagnosis not present

## 2022-06-03 ENCOUNTER — Ambulatory Visit: Payer: Medicare Other | Admitting: Neurology

## 2022-06-10 DIAGNOSIS — F419 Anxiety disorder, unspecified: Secondary | ICD-10-CM | POA: Diagnosis not present

## 2022-06-10 DIAGNOSIS — R296 Repeated falls: Secondary | ICD-10-CM | POA: Diagnosis not present

## 2022-06-10 DIAGNOSIS — S7001XA Contusion of right hip, initial encounter: Secondary | ICD-10-CM | POA: Diagnosis not present

## 2022-06-18 ENCOUNTER — Telehealth: Payer: Self-pay | Admitting: Neurology

## 2022-06-18 NOTE — Telephone Encounter (Signed)
Patient called with dizziness and confusion for a few weeks. She said she thinks it may be panic attacks but her PCP said to check with Dr. Delice Lesch.

## 2022-06-19 NOTE — Telephone Encounter (Signed)
Called patient she has been going through a divorce and she has been under a great deal of stress .Pateitn takes Ativan and sleep medication so she has been able to sleep uninterrupted. Patient has not had any seizures  but her PCP wanted her to check in with Dr. Delice Lesch

## 2022-06-20 ENCOUNTER — Other Ambulatory Visit: Payer: Self-pay | Admitting: Neurology

## 2022-06-24 NOTE — Telephone Encounter (Signed)
There is an Ativan Rx sent yesterday.

## 2022-06-24 NOTE — Telephone Encounter (Signed)
Pt called informed that Ativan was sent in yesterday

## 2022-06-24 NOTE — Telephone Encounter (Signed)
I got patient moved to 07-11-22 and cancelled her 07-25-22 appt

## 2022-06-24 NOTE — Telephone Encounter (Signed)
Pls get more info on the confusion and dizziness, is it affecting her ability to do things? She has an appt with me on 10/13, I have an opening on 9/29 at 11:30am if she wants to move it up a little earlier, otherwise ok for cancellation list. Thanks

## 2022-06-24 NOTE — Telephone Encounter (Signed)
Pt stated that it is affecting her ability to drive and that it is. She is asking for ativan to last until she is seen on 9/29

## 2022-07-04 ENCOUNTER — Ambulatory Visit: Payer: Medicare Other | Admitting: Neurology

## 2022-07-04 ENCOUNTER — Other Ambulatory Visit: Payer: Self-pay | Admitting: Neurology

## 2022-07-11 ENCOUNTER — Telehealth (INDEPENDENT_AMBULATORY_CARE_PROVIDER_SITE_OTHER): Payer: Medicare Other | Admitting: Neurology

## 2022-07-11 ENCOUNTER — Encounter: Payer: Self-pay | Admitting: Neurology

## 2022-07-11 VITALS — Ht 63.0 in | Wt 116.0 lb

## 2022-07-11 DIAGNOSIS — F419 Anxiety disorder, unspecified: Secondary | ICD-10-CM | POA: Diagnosis not present

## 2022-07-11 DIAGNOSIS — G40109 Localization-related (focal) (partial) symptomatic epilepsy and epileptic syndromes with simple partial seizures, not intractable, without status epilepticus: Secondary | ICD-10-CM

## 2022-07-11 NOTE — Addendum Note (Signed)
Addended by: Jake Seats on: 07/11/2022 01:39 PM   Modules accepted: Orders

## 2022-07-11 NOTE — Progress Notes (Signed)
Virtual Visit via Video Note The purpose of this virtual visit is to provide medical care while limiting exposure to the novel coronavirus.    Consent was obtained for video visit:  Yes.   Answered questions that patient had about telehealth interaction:  Yes.   I discussed the limitations, risks, security and privacy concerns of performing an evaluation and management service by telemedicine. I also discussed with the patient that there may be a patient responsible charge related to this service. The patient expressed understanding and agreed to proceed.  Pt location: Private vehicle Physician Location: office Name of referring provider:  Ginger Organ., MD I connected with Ashley [name] at patients initiation/request on 07/11/2022 at 11:30 AM EDT by video enabled telemedicine application and verified that I am speaking with the correct person using two identifiers. Pt MRN:  [id number] Pt DOB:  Mar 24, 1976 Video Participants:  Ashley [name]   History of Present Illness:  The patient had a virtual video visit on 07/11/2022. She was last seen 6 months ago in the neurology clinic for seizures secondary to stroke. She contacted our office earlier this month that she has been having dizziness and confusion for a few weeks and thinks they may be panic attacks. She reported taking the Ativan and mirtazapine so she can sleep uninterrupted. She is going through a lot in her personal life and currently stays with her mother. One child is with her part-time and the other 2 are with their father. She states these new episodes are different from her typical seizures. If something makes her nervous, she gets all jittery and has to stop and take deep breaths and calm down. The jittery feeling is an internal sensation but sometimes her mother tells her she can see the tremor but has to stare to see it. If seems to affect mostly her right side. They occur 1-2 times daily. She gets so scared she will have a  seizure and would take an Ativan. She filled her 10 tablets on 9/11 and has only 3 left. She sees a therapist and psychiatry NP, prescribed Prozac and Remeron. She continues on Lamotrigine '200mg'$  BID and Levetiracetam '2000mg'$  BID without side effects, no convulsions since 2019.    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 She had focal seizures in 2016 with EEG showing left PLEDs and has been on Keppra '1500mg'$  BID since then with no further focal motor seizures, however she would have occasional episodes around once a month where she could not get her words out, lasting 3-4 minutes. Sometimes she would get hot and have to sit but her speech would not be affected. On 11/27/16, she started having the same speech difficulties where she was making paraphasic errors, but it continued for 30 minutes and they decided to go to the ER. In the ER, she was noted to be aphasic, then as she was having a head CT, she suddenly had right gaze and head deviation, right arm and leg flexion and shaking for around 3 minutes. She was given IV Ativan but her right leg continued to shake in a rhythmic fashion. She was given additional Keppra and started on Vimpat with no further seizures, back to baseline in a couple of hours. She initially had some difficulties with the Vimpat '100mg'$  BID where she had hallucinations of hearing music or  voices, but these have resolved over the past week. She denies any side effects on Keppra '1500mg'$  BID. She denies any dizziness, diplopia, gait instability. She has a right foot drop, denies any numbness/tingling on the right side but states it feels different that the other side. She denies any olfactory/gustatory hallucinations, myoclonic jerks, episodes of staring/unresponsiveness, gaps in time. She has not had any further episodes of paraphasic  errors since her hospitalization. She had a mild diffuse headache for a couple of days but this has resolved. She denies any dizziness, dysarthria/dysphagia, neck/back pain, bowel/bladder dysfunction.   Records from her hospitalization in August 2016 were reviewed. She has been seeing neurologist Dr. Leonie Man for post-stroke and seizure care since then, last visit was in August 2017. She had a severe headache for several days in August 2016 2 weeks post-partum. She then developed right-sided weakness, aphasia, then a focal seizure in the hospital. She was found to have a large left parietal ICH with IVH, cerebral edema, obstructive hydrocephalus and subarachnoid hemorrhage. MRA and MRV were unremarkable. Concern was for cerebral cortical vein thrombosis not seen on MRV, however RCVS was also a consideration. She was treated with hypertonic saline and stabilized. She had a seizure with EEG showing left PLEDs. She was started on Keppra. Hypercoagulable labs were normal. She was discharged to rehab and had another seizure on 06/01/16 with report of eyes rolling back and upper extremities shaking. Keppra dose was increased. She had a repeat MRI brain in September 2016 with resolving subacute left parietal hematoma. There was acute gyral swelling and T2 hyperintensity in the left hippocampal formation, left insula, and operculum, that resolved with repeat MRI 09/2015 showing resolution of these changes. She was initially having some headaches and low dose Topamax was briefly added, then with resolution of headaches, this was tapered off. She had been dealing with post-stroke depression and symptoms had improved, however with limitations on driving again, she is starting to feel depressed again. She has three young children, the youngest is 63 months old. She was not happy with neuro rehab and has a private physical therapist coming weekly where she pushes herself and has made a ton of improvement. She does not like to wear  her AFO and her husband did not notice any difference in gait with the AFO.    Epilepsy Risk Factors:  History of large left parietal hematoma with encephalomalacia. Otherwise she had a normal birth and early development.  There is no history of febrile convulsions, CNS infections such as meningitis/encephalitis, significant traumatic brain injury, neurosurgical procedures, or family history of seizures.   Diagnostic Data: I personally reviewed MRI brain 11/26/2016 which did not show any acute changes. There was left paramedian parietal encephalomalacia with extensive hemosiderin staining extending into the splenium of the corpus callosum, ex vacuo dilatation of the left lateral ventricle occipital horn, increased side of the temporal horn of the left lateral ventricle which may be due to decreased volume of the hippocampus. It is smaller in size with increased signal compared to the right. EEG 11/27/16 showed mild left hemispheric slowing EEG 05/22/15: left hemisphere slowing, rare epileptiform discharges over the left anterior temporal region EEG 05/21/15: PLEDs on the left hemisphere, mainly involving the temporo-parietal region   Prior AEDs: Topamax, Vimpat, oxcarbazepine   Current Outpatient Medications on File Prior to Visit  Medication Sig Dispense Refill   aspirin 81 MG chewable tablet Chew 81 mg by mouth daily.     Cholecalciferol (VITAMIN D) 2000 units CAPS  Take 2,000 Units by mouth daily.     FLUoxetine HCl (PROZAC PO) Take 60 mg by mouth.     lamoTRIgine (LAMICTAL) 200 MG tablet TAKE 1 TABLET BY MOUTH TWICE DAILY 60 tablet 0   levETIRAcetam (KEPPRA) 1000 MG tablet Take 2 tablets (2,000 mg total) by mouth 2 (two) times daily. 360 tablet 3   linaclotide (LINZESS) 145 MCG CAPS capsule Take 290 mcg by mouth daily before breakfast.     LORazepam (ATIVAN) 1 MG tablet TAKE 1 TABLET BY MOUTH AS NEEDED FOR SEIZURES 10 tablet 5   magnesium oxide (MAG-OX) 400 MG tablet Take 400 mg by mouth 2 (two)  times daily.     mirtazapine (REMERON) 30 MG tablet      Multiple Vitamin (MULTIVITAMIN) tablet Take 1 tablet by mouth daily.      spironolactone (ALDACTONE) 50 MG tablet Take 50 mg by mouth 2 (two) times daily.     REXULTI 0.5 MG TABS Take 1 tablet by mouth daily. (Patient not taking: Reported on 07/11/2022)     Vilazodone HCl (VIIBRYD) 40 MG TABS Take 40 mg by mouth every morning. (Patient not taking: Reported on 07/11/2022)     No current facility-administered medications on file prior to visit.     Observations/Objective:   Vitals:   07/11/22 1050  Weight: 116 lb (52.6 kg)  Height: '5\' 3"'$  (1.6 m)   GEN:  The patient appears stated age and is in NAD.  Neurological examination: Patient is awake, alert,. No aphasia or dysarthria. Intact fluency and comprehension. Cranial nerves: Extraocular movements intact. No facial asymmetry.    Assessment and Plan:   This is a pleasant 46 yo RH woman with a history of large left ICH and focal seizures in August 2016 with residual right foot drop. Her last GTC was in 06/2018. She has been having episodes where she feels nervous and jittery 1-2 times a day, sometimes there is a visible tremor more on the right side. She has been undergoing a lot of stress, symptoms likely due to anxiety, however with her history of seizures, we discussed doing a routine EEG, and if normal, a 48-hour EEG to classify these episodes. She will discuss anxiety treatment with her psychiatrist, consider prn hydroxyzine if able. Continue Levetiracetam '2000mg'$  BID and Lamotrigine '200mg'$  BID, refills sent. She is aware of [place] driving laws to stop driving after a seizure until 6 months seizure-free. Follow-up in 3 months, call for any changes.    Follow Up Instructions:   -I discussed the assessment and treatment plan with the patient. The patient was provided an opportunity to ask questions and all were answered. The patient agreed with the plan and demonstrated an understanding of the  instructions.   The patient was advised to call back or seek an in-person evaluation if the symptoms worsen or if the condition fails to improve as anticipated.     Cameron Sprang, MD

## 2022-07-11 NOTE — Patient Instructions (Signed)
Good to see you.  Schedule routine EEG, then 2-day home EEG  2. Discuss management of anxiety with psychiatrist, consider hydroxyzine  3. Continue Lamotrigine '200mg'$  twice a day and Levetiracetam '2000mg'$  twice a day  4. Follow-up in 3 months, call for any changes.    Seizure Precautions: 1. If medication has been prescribed for you to prevent seizures, take it exactly as directed.  Do not stop taking the medicine without talking to your doctor first, even if you have not had a seizure in a long time.   2. Avoid activities in which a seizure would cause danger to yourself or to others.  Don't operate dangerous machinery, swim alone, or climb in high or dangerous places, such as on ladders, roofs, or girders.  Do not drive unless your doctor says you may.  3. If you have any warning that you may have a seizure, lay down in a safe place where you can't hurt yourself.    4.  No driving for 6 months from last seizure, as per [facility].   Please refer to the following link on the [place] website for more information: http://[email or web address].org/answerplace/[email or web address]/driving/drivingu.cfm   5.  Maintain good sleep hygiene. Avoid alcohol.  6.  Notify your neurology if you are planning pregnancy or if you become pregnant.  7.  Contact your doctor if you have any problems that may be related to the medicine you are taking.  8.  Call 911 and bring the patient back to the ED if:        A.  The seizure lasts longer than 5 minutes.       B.  The patient doesn't awaken shortly after the seizure  C.  The patient has new problems such as difficulty seeing, speaking or moving  D.  The patient was injured during the seizure  E.  The patient has a temperature over 102 F (39C)  F.  The patient vomited and now is having trouble breathing

## 2022-07-21 ENCOUNTER — Encounter: Payer: Self-pay | Admitting: Neurology

## 2022-07-21 DIAGNOSIS — R531 Weakness: Secondary | ICD-10-CM | POA: Diagnosis not present

## 2022-07-21 DIAGNOSIS — R519 Headache, unspecified: Secondary | ICD-10-CM | POA: Diagnosis not present

## 2022-07-21 DIAGNOSIS — R569 Unspecified convulsions: Secondary | ICD-10-CM | POA: Diagnosis not present

## 2022-07-21 DIAGNOSIS — R202 Paresthesia of skin: Secondary | ICD-10-CM | POA: Diagnosis not present

## 2022-07-21 DIAGNOSIS — R2990 Unspecified symptoms and signs involving the nervous system: Secondary | ICD-10-CM | POA: Diagnosis not present

## 2022-07-21 DIAGNOSIS — G459 Transient cerebral ischemic attack, unspecified: Secondary | ICD-10-CM | POA: Diagnosis not present

## 2022-07-21 DIAGNOSIS — Z8669 Personal history of other diseases of the nervous system and sense organs: Secondary | ICD-10-CM | POA: Diagnosis not present

## 2022-07-25 ENCOUNTER — Ambulatory Visit: Payer: Medicare Other | Admitting: Neurology

## 2022-07-25 DIAGNOSIS — Z23 Encounter for immunization: Secondary | ICD-10-CM | POA: Diagnosis not present

## 2022-08-02 ENCOUNTER — Other Ambulatory Visit: Payer: Self-pay | Admitting: Neurology

## 2022-08-04 DIAGNOSIS — F419 Anxiety disorder, unspecified: Secondary | ICD-10-CM | POA: Diagnosis not present

## 2022-08-04 DIAGNOSIS — F331 Major depressive disorder, recurrent, moderate: Secondary | ICD-10-CM | POA: Diagnosis not present

## 2022-08-15 ENCOUNTER — Encounter: Payer: Self-pay | Admitting: Neurology

## 2022-08-15 ENCOUNTER — Ambulatory Visit (INDEPENDENT_AMBULATORY_CARE_PROVIDER_SITE_OTHER): Payer: PPO | Admitting: Neurology

## 2022-08-15 VITALS — BP 83/39 | HR 76 | Ht 63.0 in | Wt 110.2 lb

## 2022-08-15 DIAGNOSIS — G40109 Localization-related (focal) (partial) symptomatic epilepsy and epileptic syndromes with simple partial seizures, not intractable, without status epilepticus: Secondary | ICD-10-CM
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
## 2022-08-15 NOTE — Patient Instructions (Signed)
Good to see you. Continue all your medications. Proceed with home EEG as scheduled. Follow-up as scheduled in January, call for any changes   Seizure Precautions: 1. If medication has been prescribed for you to prevent seizures, take it exactly as directed.  Do not stop taking the medicine without talking to your doctor first, even if you have not had a seizure in a long time.   2. Avoid activities in which a seizure would cause danger to yourself or to others.  Don't operate dangerous machinery, swim alone, or climb in high or dangerous places, such as on ladders, roofs, or girders.  Do not drive unless your doctor says you may.  3. If you have any warning that you may have a seizure, lay down in a safe place where you can't hurt yourself.    4.  No driving for 6 months from last seizure, as per [facility].   Please refer to the following link on the [facility] website for more information: http://www.epilepsyfoundation.org/answerplace/Social/driving/drivingu.cfm   5.  Maintain good sleep hygiene.   6.  Notify your neurology if you are planning pregnancy or if you become pregnant.  7.  Contact your doctor if you have any problems that may be related to the medicine you are taking.  8.  Call 911 and bring the patient back to the ED if:        A.  The seizure lasts longer than 5 minutes.       B.  The patient doesn't awaken shortly after the seizure  C.  The patient has new problems such as difficulty seeing, speaking or moving  D.  The patient was injured during the seizure  E.  The patient has a temperature over 102 F (39C)  F.  The patient vomited and now is having trouble breathing

## 2022-08-20 DIAGNOSIS — Z124 Encounter for screening for malignant neoplasm of cervix: Secondary | ICD-10-CM | POA: Diagnosis not present

## 2022-08-20 DIAGNOSIS — Z1231 Encounter for screening mammogram for malignant neoplasm of breast: Secondary | ICD-10-CM | POA: Diagnosis not present

## 2022-08-20 DIAGNOSIS — Z01419 Encounter for gynecological examination (general) (routine) without abnormal findings: Secondary | ICD-10-CM | POA: Diagnosis not present

## 2022-08-21 ENCOUNTER — Ambulatory Visit (INDEPENDENT_AMBULATORY_CARE_PROVIDER_SITE_OTHER): Payer: PPO | Admitting: Neurology

## 2022-08-21 DIAGNOSIS — G40109 Localization-related (focal) (partial) symptomatic epilepsy and epileptic syndromes with simple partial seizures, not intractable, without status epilepticus: Secondary | ICD-10-CM | POA: Diagnosis not present

## 2022-08-21 DIAGNOSIS — F419 Anxiety disorder, unspecified: Secondary | ICD-10-CM

## 2022-08-24 DIAGNOSIS — R569 Unspecified convulsions: Secondary | ICD-10-CM | POA: Diagnosis not present

## 2022-08-27 ENCOUNTER — Other Ambulatory Visit: Payer: Self-pay | Admitting: Neurology

## 2022-09-16 DIAGNOSIS — F419 Anxiety disorder, unspecified: Secondary | ICD-10-CM | POA: Diagnosis not present

## 2022-09-16 DIAGNOSIS — F331 Major depressive disorder, recurrent, moderate: Secondary | ICD-10-CM | POA: Diagnosis not present

## 2022-09-17 DIAGNOSIS — F4322 Adjustment disorder with anxiety: Secondary | ICD-10-CM | POA: Diagnosis not present

## 2022-09-19 DIAGNOSIS — Z79899 Other long term (current) drug therapy: Secondary | ICD-10-CM | POA: Diagnosis not present

## 2022-09-19 DIAGNOSIS — R7989 Other specified abnormal findings of blood chemistry: Secondary | ICD-10-CM | POA: Diagnosis not present

## 2022-09-19 DIAGNOSIS — F419 Anxiety disorder, unspecified: Secondary | ICD-10-CM | POA: Diagnosis not present

## 2022-09-24 ENCOUNTER — Telehealth: Payer: Self-pay

## 2022-09-24 DIAGNOSIS — F4322 Adjustment disorder with anxiety: Secondary | ICD-10-CM | POA: Diagnosis not present

## 2022-09-24 NOTE — Procedures (Signed)
Patient's Name: Ashley [name] MRN: [id number] Date of Birth: [birth year]   Ordering Provider: Ellouise Newer, MD DX CODE(s): G40.009 EXAM DURATION: 43 hours   CLINICAL HISTORY: This is a 46 year old woman with a history of seizures with recurrent episodes where she feels nervous and jittery 1-2 times a day, sometimes there is a visible tremor more on the right side. EEG for classification.   MEDICATION(s): Lamotrigine, Levetiracetam, Rexulti, aspirin, Viibryd, Lorazepam   TECHNICAL DESCRIPTION: Long-Term EEG with Video was monitored intermittently by a qualified EEG technologist for the entirety of the recording; quality check-ins were performed at a minimum of every two hours, checking, and documenting real-time data and video to assure the integrity and quality of the recording (e.g., camera position, electrode integrity and impedance), and identify the need for maintenance. For intermittent monitoring, an EEG Technologist monitored no more than 12 patients concurrently. Video was being recorded at least 80% of the time during the study duration, unless otherwise noted in an Exception Statement.   At the end of the recording, the EEG Technologist generates a technical description, which is the EEG Technologist's written documentation of the reviewed video-EEG data, including technical interventions and these elements: reviewing raw EEG/VEEG data and events and automated detection as well as patient pushbutton event activations; and annotating, editing, and archiving EEG/VEEG data for review by the physician or other qualified healthcare professional. For review, the Video EEG recording can be visualized in all standard types of montages, 16 channels and greater, and playbacks include digital high frequency filters previously noted. The Video EEG has been notated with patient typical symptom events at the direction of the patient by depressing a push button mounted on a waist worn Lifelines EEG recording  device. Digital spike and seizure detection software was used to identify potential abnormalities in the EEG, and alerts were reviewed and annotated by the technologist in the [employer] EEG Review software. Video EEG and report are notated with events that were determined to be of significance by the digital analysis software showing spike and seizure detections.   SET-UP TECH: Sherdinia Stepney   RECORDING SET-UP DATE: 08/24/2022 3:18PM RECORDING TAKE-DOWN DATE: 08/26/2022 10:48AM    SPIKE AND SEIZURE ANALYSIS AND REVIEW: Spike and seizure detection software alerts have been reviewed by a Production designer, theatre/television/film. Some of these alerts appear to have clinical significance.  PUSH BUTTON EVENTS: A patient diary was maintained. A button press or notation was made 2 times. Patient log was reviewed with the patient at disconnect with the intent to reconcile events. 5 button presses were accidental or monitoring tech driven.    DESCRIPTION OF RECORDING: During maximal wakefulness, the background activity consisted of a symmetric 9-10 Hz posterior dominant rhythm that was reactive to eye opening and eye closure. There is occasional focal 4-5 Hz theta slowing over the left temporal region. There were no epileptiform discharges or electrographic seizures seen in wakefulness.   During the recording, the patient progresses through wakefulness, drowsiness, and sleep. Vertex waves and sleep spindles were seen. There were occasional broad left frontotemporal sharp waves seen exclusively in sleep.  EKG lead was unremarkable.    PUSH BUTTON EVENTS: On 11/13 at 1219 hours, patient pushes button. No diary entry. Patient not on video. Electrographically, there were no EEG or EKG changes seen.  On 11/13 at 1228 hours, she notes numbness on right side, shaking exact time unsure. Button not pushed. Patient seen on couch, no clinical changes noted. Electrographically, there were no EEG or EKG  changes seen.      IMPRESSION: This 43-hour ambulatory video EEG study is abnormal due to the presence of: Occasional slowing over the left temporal region Occasional left frontotemporal epileptiform discharges seen exclusively in sleep.    CLINICAL CORRELATION of the above findings indicates focal cerebral dysfunction over the left temporal region with a possible tendency for seizures to arise from the left frontotemporal region. Episode of right-sided numbness and shaking (not seen on video) did not show any EEG correlate. This may be a surface negative simple partial seizure, however non-epileptic event is also a possibility. If further clinical questions remain, inpatient video EEG monitoring may be helpful.     Ellouise Newer, MD

## 2022-09-24 NOTE — Telephone Encounter (Signed)
-----   Message from Cameron Sprang, MD sent at 09/24/2022  3:39 PM EST ----- Pls let Ashley [name] know her EEG did not show any ongoing seizure activity. Similar changes to her last EEG consistent with old stroke. How is she doing? Thanks

## 2022-09-24 NOTE — Telephone Encounter (Signed)
Pt called an informed that EEG did not show any ongoing seizure activity. Similar changes to her last EEG consistent with old stroke. How is she doing? She is doing a lot better than when she talked to Dr Delice Lesch

## 2022-09-25 DIAGNOSIS — G3184 Mild cognitive impairment, so stated: Secondary | ICD-10-CM | POA: Diagnosis not present

## 2022-09-25 DIAGNOSIS — I69351 Hemiplegia and hemiparesis following cerebral infarction affecting right dominant side: Secondary | ICD-10-CM | POA: Diagnosis not present

## 2022-09-25 DIAGNOSIS — Z1339 Encounter for screening examination for other mental health and behavioral disorders: Secondary | ICD-10-CM | POA: Diagnosis not present

## 2022-09-25 DIAGNOSIS — I69320 Aphasia following cerebral infarction: Secondary | ICD-10-CM | POA: Diagnosis not present

## 2022-09-25 DIAGNOSIS — F324 Major depressive disorder, single episode, in partial remission: Secondary | ICD-10-CM | POA: Diagnosis not present

## 2022-09-25 DIAGNOSIS — F419 Anxiety disorder, unspecified: Secondary | ICD-10-CM | POA: Diagnosis not present

## 2022-09-25 DIAGNOSIS — R82998 Other abnormal findings in urine: Secondary | ICD-10-CM | POA: Diagnosis not present

## 2022-09-25 DIAGNOSIS — Z1331 Encounter for screening for depression: Secondary | ICD-10-CM | POA: Diagnosis not present

## 2022-09-25 DIAGNOSIS — K5900 Constipation, unspecified: Secondary | ICD-10-CM | POA: Diagnosis not present

## 2022-09-25 DIAGNOSIS — G40909 Epilepsy, unspecified, not intractable, without status epilepticus: Secondary | ICD-10-CM | POA: Diagnosis not present

## 2022-09-25 DIAGNOSIS — Z0000 Encounter for general adult medical examination without abnormal findings: Secondary | ICD-10-CM | POA: Diagnosis not present

## 2022-09-25 DIAGNOSIS — Z803 Family history of malignant neoplasm of breast: Secondary | ICD-10-CM | POA: Diagnosis not present

## 2022-10-01 DIAGNOSIS — F4322 Adjustment disorder with anxiety: Secondary | ICD-10-CM | POA: Diagnosis not present

## 2022-10-10 DIAGNOSIS — F4322 Adjustment disorder with anxiety: Secondary | ICD-10-CM | POA: Diagnosis not present

## 2022-10-16 DIAGNOSIS — F4322 Adjustment disorder with anxiety: Secondary | ICD-10-CM | POA: Diagnosis not present

## 2022-10-20 DIAGNOSIS — L700 Acne vulgaris: Secondary | ICD-10-CM | POA: Diagnosis not present

## 2022-10-21 ENCOUNTER — Ambulatory Visit: Payer: Medicare Other | Admitting: Neurology

## 2022-10-22 ENCOUNTER — Telehealth: Payer: Self-pay | Admitting: Anesthesiology

## 2022-10-22 DIAGNOSIS — F4322 Adjustment disorder with anxiety: Secondary | ICD-10-CM | POA: Diagnosis not present

## 2022-10-23 ENCOUNTER — Telehealth: Payer: Self-pay | Admitting: Neurology

## 2022-10-23 MED ORDER — LEVETIRACETAM 1000 MG PO TABS: 2000.0000 mg | ORAL_TABLET | Freq: Two times a day (BID) | ORAL | 0 refills | Status: DC

## 2022-10-23 NOTE — Telephone Encounter (Signed)
1. Which medications need refilled? (List name and dosage, if known) keppra - had to move appt due to weather on 10/21/22 to 10/30/22. She doesn't have enough to get to her 10/30/22 appt  2. Which pharmacy/location is medication to be sent to? (include street and city if Management consultant) [place]

## 2022-10-23 NOTE — Telephone Encounter (Signed)
Refill sent in for pt. 

## 2022-10-24 ENCOUNTER — Telehealth: Payer: Self-pay | Admitting: Neurology

## 2022-10-24 NOTE — Telephone Encounter (Signed)
Pt called pharmacy has her medication ready

## 2022-10-24 NOTE — Telephone Encounter (Signed)
Patient states that she needs a refill on her keppra and that she will run out this weekend, she uses the [facility] in [place]  she states that she called the pharmacy and they were to call our office.   She said that last this was filled the pharmacy shorted her pills and they were to call her to give them to her but she had not heard from them and she forgot about it

## 2022-10-29 DIAGNOSIS — F4322 Adjustment disorder with anxiety: Secondary | ICD-10-CM | POA: Diagnosis not present

## 2022-10-30 ENCOUNTER — Encounter: Payer: Self-pay | Admitting: Neurology

## 2022-10-30 ENCOUNTER — Ambulatory Visit (INDEPENDENT_AMBULATORY_CARE_PROVIDER_SITE_OTHER): Payer: PPO | Admitting: Neurology

## 2022-10-30 VITALS — BP 95/60 | HR 77 | Ht 63.0 in | Wt 112.6 lb

## 2022-10-30 DIAGNOSIS — G40109 Localization-related (focal) (partial) symptomatic epilepsy and epileptic syndromes with simple partial seizures, not intractable, without status epilepticus: Secondary | ICD-10-CM

## 2022-10-30 MED ORDER — LAMOTRIGINE 200 MG PO TABS: 200.0000 mg | ORAL_TABLET | Freq: Two times a day (BID) | ORAL | 3 refills | Status: DC

## 2022-10-30 MED ORDER — LORAZEPAM 1 MG PO TABS: ORAL_TABLET | ORAL | 5 refills | Status: DC

## 2022-10-30 MED ORDER — LEVETIRACETAM 1000 MG PO TABS: 2000.0000 mg | ORAL_TABLET | Freq: Two times a day (BID) | ORAL | 3 refills | Status: DC

## 2022-10-30 NOTE — Patient Instructions (Signed)
Always good to see you. I wish you all the best. Continue Lamotrigine '200mg'$  twice a day and Levetiracetam '2000mg'$  twice a day. Refills also sent for lorazepam as needed. Follow-up in 6 months, call for any changes.    Seizure Precautions: 1. If medication has been prescribed for you to prevent seizures, take it exactly as directed.  Do not stop taking the medicine without talking to your doctor first, even if you have not had a seizure in a long time.   2. Avoid activities in which a seizure would cause danger to yourself or to others.  Don't operate dangerous machinery, swim alone, or climb in high or dangerous places, such as on ladders, roofs, or girders.  Do not drive unless your doctor says you may.  3. If you have any warning that you may have a seizure, lay down in a safe place where you can't hurt yourself.    4.  No driving for 6 months from last seizure, as per [facility].   Please refer to the following link on the [facility] website for more information: http://www.epilepsyfoundation.org/answerplace/Social/driving/drivingu.cfm   5.  Maintain good sleep hygiene. Avoid alcohol.  6.  Contact your doctor if you have any problems that may be related to the medicine you are taking.  7.  Call 911 and bring the patient back to the ED if:        A.  The seizure lasts longer than 5 minutes.       B.  The patient doesn't awaken shortly after the seizure  C.  The patient has new problems such as difficulty seeing, speaking or moving  D.  The patient was injured during the seizure  E.  The patient has a temperature over 102 F (39C)  F.  The patient vomited and now is having trouble breathing

## 2022-10-30 NOTE — Progress Notes (Signed)
NEUROLOGY FOLLOW UP OFFICE NOTE  Ashley [name] [id number] [birth year]  HISTORY OF PRESENT ILLNESS: I had the pleasure of seeing Ashley [name] in follow-up in the neurology clinic on 10/30/2022.  The patient was last seen 2 months ago for seizures secondary to stroke. She is alone in the office today. On her prior visits, she was reporting episodes of dizziness and confusion occurring with anxiety.  She had a 43-hour ambulatory EEG in 08/2022 which showed occasional left temporal slowing, occasional left frontotemporal epileptiform discharges seen exclusively in sleep. No seizures captured. Episode of right-sided numbness and shaking (not seen on video) did not show EEG correlate. She continues on Lamotrigine '200mg'$  BID and Levetiracetam '2000mg'$  BID without side effects. She has not needed the lorazepam in a while. She has not had any GTCs since 2019. She denies any further right-sided shaking. She reports 2 episodes where she felt anxious, she took deep breaths and symptoms passed. She denies any staring/unresponsive episodes. She has been having more headaches lately over the frontal region. Tylenol helps, no nausea/vomiting. She gets 7-8 hours of sleep with mirtazapine. Mood is up and down. She is still living with her mother. She had a fall a couple of weeks ago. She walks regularly for exercise.    History on Initial Assessment 12/10/2016: This is a pleasant 47 yo RH woman with a history of left parietal ICH in 2016 with residual right foot drop, admitted February 14-15, 2018 for focal seizures. She had focal seizures in 2016 with EEG showing left PLEDs and has been on Keppra '1500mg'$  BID since then with no further focal motor seizures, however she would have occasional episodes around once a month where she could not get her words out, lasting 3-4 minutes. Sometimes she would get hot and have to sit but her speech would not be affected. On 11/27/16, she started having the same speech difficulties where she  was making paraphasic errors, but it continued for 30 minutes and they decided to go to the ER. In the ER, she was noted to be aphasic, then as she was having a head CT, she suddenly had right gaze and head deviation, right arm and leg flexion and shaking for around 3 minutes. She was given IV Ativan but her right leg continued to shake in a rhythmic fashion. She was given additional Keppra and started on Vimpat with no further seizures, back to baseline in a couple of hours. She initially had some difficulties with the Vimpat '100mg'$  BID where she had hallucinations of hearing music or voices, but these have resolved over the past week. She denies any side effects on Keppra '1500mg'$  BID. She denies any dizziness, diplopia, gait instability. She has a right foot drop, denies any numbness/tingling on the right side but states it feels different that the other side. She denies any olfactory/gustatory hallucinations, myoclonic jerks, episodes of staring/unresponsiveness, gaps in time. She has not had any further episodes of paraphasic errors since her hospitalization. She had a mild diffuse headache for a couple of days but this has resolved. She denies any dizziness, dysarthria/dysphagia, neck/back pain, bowel/bladder dysfunction.   Records from her hospitalization in August 2016 were reviewed. She has been seeing neurologist Dr. Leonie Man for post-stroke and seizure care since then, last visit was in August 2017. She had a severe headache for several days in August 2016 2 weeks post-partum. She then developed right-sided weakness, aphasia, then a focal seizure in the hospital. She was found to have a large left parietal  ICH with IVH, cerebral edema, obstructive hydrocephalus and subarachnoid hemorrhage. MRA and MRV were unremarkable. Concern was for cerebral cortical vein thrombosis not seen on MRV, however RCVS was also a consideration. She was treated with hypertonic saline and stabilized. She had a seizure with EEG  showing left PLEDs. She was started on Keppra. Hypercoagulable labs were normal. She was discharged to rehab and had another seizure on 06/01/16 with report of eyes rolling back and upper extremities shaking. Keppra dose was increased. She had a repeat MRI brain in September 2016 with resolving subacute left parietal hematoma. There was acute gyral swelling and T2 hyperintensity in the left hippocampal formation, left insula, and operculum, that resolved with repeat MRI 09/2015 showing resolution of these changes. She was initially having some headaches and low dose Topamax was briefly added, then with resolution of headaches, this was tapered off. She had been dealing with post-stroke depression and symptoms had improved, however with limitations on driving again, she is starting to feel depressed again. She has three young children, the youngest is 66 months old. She was not happy with neuro rehab and has a private physical therapist coming weekly where she pushes herself and has made a ton of improvement. She does not like to wear her AFO and her husband did not notice any difference in gait with the AFO.    Epilepsy Risk Factors:  History of large left parietal hematoma with encephalomalacia. Otherwise she had a normal birth and early development.  There is no history of febrile convulsions, CNS infections such as meningitis/encephalitis, significant traumatic brain injury, neurosurgical procedures, or family history of seizures.   Diagnostic Data: I personally reviewed MRI brain 11/26/2016 which did not show any acute changes. There was left paramedian parietal encephalomalacia with extensive hemosiderin staining extending into the splenium of the corpus callosum, ex vacuo dilatation of the left lateral ventricle occipital horn, increased side of the temporal horn of the left lateral ventricle which may be due to decreased volume of the hippocampus. It is smaller in size with increased signal compared to the  right. EEG 11/27/16 showed mild left hemispheric slowing EEG 05/22/15: left hemisphere slowing, rare epileptiform discharges over the left anterior temporal region EEG 05/21/15: PLEDs on the left hemisphere, mainly involving the temporo-parietal region   Prior AEDs: Topamax, Vimpat, oxcarbazepine  PAST MEDICAL HISTORY: Past Medical History:  Diagnosis Date   Aphasia following nontraumatic intracerebral hemorrhage 05/28/2015   Left parietal ICH Aug 2016    Cerebral venous thrombosis of cortical vein    Contracture of muscle ankle and foot 06/11/2015   Right heel cord    Cytotoxic cerebral edema    Generalized anxiety disorder 05/28/2015   Hypotension 06/02/2015   ICH (intracerebral hemorrhage)    IVH (intraventricular hemorrhage)    Left-sided intracerebral hemorrhage 06/04/2015   Major depressive disorder 03/23/2017   Right spastic hemiparesis 05/28/2015   Left ICH   Seizure disorder, nonconvulsive, with status epilepticus    last seizure early 2021   Sepsis 06/03/2015   UTI (urinary tract infection) 06/03/2015    MEDICATIONS: Current Outpatient Medications on File Prior to Visit  Medication Sig Dispense Refill   aspirin 81 MG chewable tablet Chew 81 mg by mouth daily.     Cholecalciferol (VITAMIN D) 2000 units CAPS Take 2,000 Units by mouth daily.     FLUoxetine HCl (PROZAC PO) Take 60 mg by mouth.     lamoTRIgine (LAMICTAL) 200 MG tablet TAKE 1 TABLET BY MOUTH TWICE DAILY 60  tablet 3   levETIRAcetam (KEPPRA) 1000 MG tablet Take 2 tablets (2,000 mg total) by mouth 2 (two) times daily. 120 tablet 0   linaclotide (LINZESS) 145 MCG CAPS capsule Take 290 mcg by mouth daily before breakfast.     LORazepam (ATIVAN) 1 MG tablet TAKE 1 TABLET BY MOUTH AS NEEDED FOR SEIZURES 10 tablet 5   magnesium oxide (MAG-OX) 400 MG tablet Take 400 mg by mouth 2 (two) times daily.     mirtazapine (REMERON) 30 MG tablet      Multiple Vitamin (MULTIVITAMIN) tablet Take 1 tablet by mouth daily.       spironolactone (ALDACTONE) 50 MG tablet Take 50 mg by mouth 2 (two) times daily.     No current facility-administered medications on file prior to visit.    ALLERGIES: No Known Allergies  FAMILY HISTORY: Family History  Problem Relation Age of Onset   Cancer Mother    Cancer Father        pancreatic   Colon cancer Neg Hx    Colon polyps Neg Hx    Esophageal cancer Neg Hx    Rectal cancer Neg Hx    Stomach cancer Neg Hx     SOCIAL HISTORY: Social History   Socioeconomic History   Marital status: Married    Spouse name: Not on file   Number of children: Not on file   Years of education: 16   Highest education level: Bachelor's degree (e.g., BA, AB, BS)  Occupational History   Not on file  Tobacco Use   Smoking status: Never   Smokeless tobacco: Never  Vaping Use   Vaping Use: Never used  Substance and Sexual Activity   Alcohol use: Yes    Comment: wine 1-2 times a month per pt   Drug use: No   Sexual activity: Yes    Birth control/protection: None    Comment: husband has vasectomy  Other Topics Concern   Not on file  Social History Narrative   Lives with husband and three kids in two story home      Right handed      Highest level of edu- bachelors   Social Determinants of Health   Financial Resource Strain: Not on file  Food Insecurity: Not on file  Transportation Needs: Not on file  Physical Activity: Not on file  Stress: Not on file  Social Connections: Not on file  Intimate Partner Violence: Not on file     PHYSICAL EXAM: Vitals:   10/30/22 1425  BP: 95/60  Pulse: 77  SpO2: 98%   General: No acute distress Head:  Normocephalic/atraumatic Skin/Extremities: No rash, no edema Neurological Exam: alert and awake. No aphasia or dysarthria. Fund of knowledge is appropriate. Attention and concentration are normal.   Cranial nerves: Pupils equal, round. Extraocular movements intact with no nystagmus. Visual fields full.  No facial asymmetry.  Motor:  Bulk and tone normal, muscle strength 5/5 throughout except for right foot drop with brace. Finger to nose testing intact.  Gait spastic hemiparetic gait on right (chronic).    IMPRESSION: This is a pleasant 47 yo RH woman with a history of large left ICH and focal seizures in August 2016 with residual right foot drop. Her last GTC was in 06/2018. She has been having episodes of feeling nervous and jittery, that have quieted down with meditation. Prolonged EEG showed left frontotemporal epileptiform discharges in sleep, no seizures seen. She is overall doing well, continue Lamotrigine '200mg'$  BID and Levetiracetam '2000mg'$   BID, she has prn lorazepam for seizure rescue. She is aware of [place] driving laws to stop driving after a seizure until 6 months seizure-free. Follow-up in 6 months, call for any changes.    Thank you for allowing me to participate in her care.  Please do not hesitate to call for any questions or concerns.    Ellouise Newer, M.D.   CC: Dr. Brigitte Pulse

## 2022-10-31 ENCOUNTER — Ambulatory Visit: Payer: Medicare Other | Admitting: Neurology

## 2022-11-03 DIAGNOSIS — E559 Vitamin D deficiency, unspecified: Secondary | ICD-10-CM | POA: Diagnosis not present

## 2022-11-03 DIAGNOSIS — F3341 Major depressive disorder, recurrent, in partial remission: Secondary | ICD-10-CM | POA: Diagnosis not present

## 2022-11-03 DIAGNOSIS — K589 Irritable bowel syndrome without diarrhea: Secondary | ICD-10-CM | POA: Diagnosis not present

## 2022-11-03 DIAGNOSIS — G2581 Restless legs syndrome: Secondary | ICD-10-CM | POA: Diagnosis not present

## 2022-11-03 DIAGNOSIS — I69313 Psychomotor deficit following cerebral infarction: Secondary | ICD-10-CM | POA: Diagnosis not present

## 2022-11-03 DIAGNOSIS — Z7982 Long term (current) use of aspirin: Secondary | ICD-10-CM | POA: Diagnosis not present

## 2022-11-03 DIAGNOSIS — R569 Unspecified convulsions: Secondary | ICD-10-CM | POA: Diagnosis not present

## 2022-11-03 DIAGNOSIS — I69311 Memory deficit following cerebral infarction: Secondary | ICD-10-CM | POA: Diagnosis not present

## 2022-11-03 DIAGNOSIS — F419 Anxiety disorder, unspecified: Secondary | ICD-10-CM | POA: Diagnosis not present

## 2022-11-03 DIAGNOSIS — I69351 Hemiplegia and hemiparesis following cerebral infarction affecting right dominant side: Secondary | ICD-10-CM | POA: Diagnosis not present

## 2022-11-12 DIAGNOSIS — M21371 Foot drop, right foot: Secondary | ICD-10-CM | POA: Diagnosis not present

## 2022-11-12 DIAGNOSIS — F4322 Adjustment disorder with anxiety: Secondary | ICD-10-CM | POA: Diagnosis not present

## 2022-11-12 DIAGNOSIS — I69351 Hemiplegia and hemiparesis following cerebral infarction affecting right dominant side: Secondary | ICD-10-CM | POA: Diagnosis not present

## 2022-11-19 DIAGNOSIS — F4322 Adjustment disorder with anxiety: Secondary | ICD-10-CM | POA: Diagnosis not present

## 2022-11-26 DIAGNOSIS — F4322 Adjustment disorder with anxiety: Secondary | ICD-10-CM | POA: Diagnosis not present

## 2022-12-15 DIAGNOSIS — F331 Major depressive disorder, recurrent, moderate: Secondary | ICD-10-CM | POA: Diagnosis not present

## 2022-12-15 DIAGNOSIS — F419 Anxiety disorder, unspecified: Secondary | ICD-10-CM | POA: Diagnosis not present

## 2022-12-17 DIAGNOSIS — F4322 Adjustment disorder with anxiety: Secondary | ICD-10-CM | POA: Diagnosis not present

## 2022-12-24 DIAGNOSIS — F4322 Adjustment disorder with anxiety: Secondary | ICD-10-CM | POA: Diagnosis not present

## 2022-12-31 DIAGNOSIS — F4322 Adjustment disorder with anxiety: Secondary | ICD-10-CM | POA: Diagnosis not present

## 2023-01-14 DIAGNOSIS — F4322 Adjustment disorder with anxiety: Secondary | ICD-10-CM | POA: Diagnosis not present

## 2023-01-28 DIAGNOSIS — F4322 Adjustment disorder with anxiety: Secondary | ICD-10-CM | POA: Diagnosis not present

## 2023-02-11 DIAGNOSIS — F4322 Adjustment disorder with anxiety: Secondary | ICD-10-CM | POA: Diagnosis not present

## 2023-02-18 DIAGNOSIS — F4322 Adjustment disorder with anxiety: Secondary | ICD-10-CM | POA: Diagnosis not present

## 2023-02-27 DIAGNOSIS — W07XXXA Fall from chair, initial encounter: Secondary | ICD-10-CM | POA: Diagnosis not present

## 2023-02-27 DIAGNOSIS — Z7982 Long term (current) use of aspirin: Secondary | ICD-10-CM | POA: Diagnosis not present

## 2023-02-27 DIAGNOSIS — G40909 Epilepsy, unspecified, not intractable, without status epilepticus: Secondary | ICD-10-CM | POA: Diagnosis not present

## 2023-02-27 DIAGNOSIS — R296 Repeated falls: Secondary | ICD-10-CM | POA: Diagnosis not present

## 2023-02-27 DIAGNOSIS — F0781 Postconcussional syndrome: Secondary | ICD-10-CM | POA: Diagnosis not present

## 2023-02-27 DIAGNOSIS — M21371 Foot drop, right foot: Secondary | ICD-10-CM | POA: Diagnosis not present

## 2023-02-27 DIAGNOSIS — S066X0A Traumatic subarachnoid hemorrhage without loss of consciousness, initial encounter: Secondary | ICD-10-CM | POA: Diagnosis not present

## 2023-02-27 DIAGNOSIS — S0101XA Laceration without foreign body of scalp, initial encounter: Secondary | ICD-10-CM | POA: Diagnosis not present

## 2023-02-27 DIAGNOSIS — S065X9A Traumatic subdural hemorrhage with loss of consciousness of unspecified duration, initial encounter: Secondary | ICD-10-CM | POA: Diagnosis not present

## 2023-02-27 DIAGNOSIS — R58 Hemorrhage, not elsewhere classified: Secondary | ICD-10-CM | POA: Diagnosis not present

## 2023-02-27 DIAGNOSIS — Z6821 Body mass index (BMI) 21.0-21.9, adult: Secondary | ICD-10-CM | POA: Diagnosis not present

## 2023-02-27 DIAGNOSIS — R200 Anesthesia of skin: Secondary | ICD-10-CM | POA: Diagnosis not present

## 2023-02-27 DIAGNOSIS — R569 Unspecified convulsions: Secondary | ICD-10-CM | POA: Diagnosis not present

## 2023-02-27 DIAGNOSIS — Z79899 Other long term (current) drug therapy: Secondary | ICD-10-CM | POA: Diagnosis not present

## 2023-02-27 DIAGNOSIS — G40919 Epilepsy, unspecified, intractable, without status epilepticus: Secondary | ICD-10-CM | POA: Diagnosis not present

## 2023-02-27 DIAGNOSIS — I959 Hypotension, unspecified: Secondary | ICD-10-CM | POA: Diagnosis not present

## 2023-02-27 DIAGNOSIS — G40109 Localization-related (focal) (partial) symptomatic epilepsy and epileptic syndromes with simple partial seizures, not intractable, without status epilepticus: Secondary | ICD-10-CM | POA: Diagnosis not present

## 2023-02-27 DIAGNOSIS — S066XAA Traumatic subarachnoid hemorrhage with loss of consciousness status unknown, initial encounter: Secondary | ICD-10-CM | POA: Diagnosis not present

## 2023-02-27 DIAGNOSIS — W19XXXA Unspecified fall, initial encounter: Secondary | ICD-10-CM | POA: Diagnosis not present

## 2023-02-27 DIAGNOSIS — G8191 Hemiplegia, unspecified affecting right dominant side: Secondary | ICD-10-CM | POA: Diagnosis not present

## 2023-02-27 DIAGNOSIS — Z8673 Personal history of transient ischemic attack (TIA), and cerebral infarction without residual deficits: Secondary | ICD-10-CM | POA: Diagnosis not present

## 2023-02-27 DIAGNOSIS — G8110 Spastic hemiplegia affecting unspecified side: Secondary | ICD-10-CM | POA: Diagnosis not present

## 2023-03-04 DIAGNOSIS — F4322 Adjustment disorder with anxiety: Secondary | ICD-10-CM | POA: Diagnosis not present

## 2023-03-12 ENCOUNTER — Telehealth: Payer: Self-pay | Admitting: Neurology

## 2023-03-12 DIAGNOSIS — I69351 Hemiplegia and hemiparesis following cerebral infarction affecting right dominant side: Secondary | ICD-10-CM | POA: Diagnosis not present

## 2023-03-12 DIAGNOSIS — Z4802 Encounter for removal of sutures: Secondary | ICD-10-CM | POA: Diagnosis not present

## 2023-03-12 DIAGNOSIS — G40909 Epilepsy, unspecified, not intractable, without status epilepticus: Secondary | ICD-10-CM | POA: Diagnosis not present

## 2023-03-12 DIAGNOSIS — Q283 Other malformations of cerebral vessels: Secondary | ICD-10-CM | POA: Diagnosis not present

## 2023-03-12 DIAGNOSIS — S066X0D Traumatic subarachnoid hemorrhage without loss of consciousness, subsequent encounter: Secondary | ICD-10-CM | POA: Diagnosis not present

## 2023-03-12 NOTE — Telephone Encounter (Signed)
Pt called informed to keep 6/11 appointment

## 2023-03-12 NOTE — Telephone Encounter (Signed)
Pt has appt with Dr Karel Jarvis on 03-24-23. She fell a couple of weeks ago and she called in today stating that her PCP wanted her to check with Dr Karel Jarvis if Dr Karel Jarvis wanted or needed to see her before the 03-24-23 appt  please call

## 2023-03-12 NOTE — Telephone Encounter (Signed)
Keep 6/11 appt, thanks

## 2023-03-16 DIAGNOSIS — F419 Anxiety disorder, unspecified: Secondary | ICD-10-CM | POA: Diagnosis not present

## 2023-03-16 DIAGNOSIS — F331 Major depressive disorder, recurrent, moderate: Secondary | ICD-10-CM | POA: Diagnosis not present

## 2023-03-18 DIAGNOSIS — F4322 Adjustment disorder with anxiety: Secondary | ICD-10-CM | POA: Diagnosis not present

## 2023-03-20 DIAGNOSIS — M25511 Pain in right shoulder: Secondary | ICD-10-CM | POA: Diagnosis not present

## 2023-03-24 ENCOUNTER — Encounter: Payer: Self-pay | Admitting: Neurology

## 2023-03-24 ENCOUNTER — Ambulatory Visit (INDEPENDENT_AMBULATORY_CARE_PROVIDER_SITE_OTHER): Payer: PPO | Admitting: Neurology

## 2023-03-24 VITALS — BP 96/50 | HR 72 | Ht 63.0 in | Wt 114.6 lb

## 2023-03-24 DIAGNOSIS — S066X0D Traumatic subarachnoid hemorrhage without loss of consciousness, subsequent encounter: Secondary | ICD-10-CM | POA: Diagnosis not present

## 2023-03-24 DIAGNOSIS — G40109 Localization-related (focal) (partial) symptomatic epilepsy and epileptic syndromes with simple partial seizures, not intractable, without status epilepticus: Secondary | ICD-10-CM

## 2023-03-24 MED ORDER — LEVETIRACETAM 1000 MG PO TABS: 2000.0000 mg | ORAL_TABLET | Freq: Two times a day (BID) | ORAL | 3 refills | Status: DC

## 2023-03-24 MED ORDER — LORAZEPAM 1 MG PO TABS: ORAL_TABLET | ORAL | 5 refills | Status: DC

## 2023-03-24 MED ORDER — LAMOTRIGINE 200 MG PO TABS: 200.0000 mg | ORAL_TABLET | Freq: Two times a day (BID) | ORAL | 3 refills | Status: DC

## 2023-03-24 NOTE — Patient Instructions (Signed)
Good to see you. Good luck with your shoulder!  Schedule head CT without contrast. I will contact you with results and plan to restart aspirin after CT  2. Continue all your medications  3. Follow-up in 6 months, call for any changes   Seizure Precautions: 1. If medication has been prescribed for you to prevent seizures, take it exactly as directed.  Do not stop taking the medicine without talking to your doctor first, even if you have not had a seizure in a long time.   2. Avoid activities in which a seizure would cause danger to yourself or to others.  Don't operate dangerous machinery, swim alone, or climb in high or dangerous places, such as on ladders, roofs, or girders.  Do not drive unless your doctor says you may.  3. If you have any warning that you may have a seizure, lay down in a safe place where you can't hurt yourself.    4.  No driving for 6 months from last seizure, as per [facility].   Please refer to the following link on the Epilepsy [employer] of [employer]'s website for more information: http://www.epilepsyfoundation.org/answerplace/Social/driving/drivingu.cfm   5.  Maintain good sleep hygiene. Avoid alcohol.  6.  Notify your neurology if you are planning pregnancy or if you become pregnant.  7.  Contact your doctor if you have any problems that may be related to the medicine you are taking.  8.  Call 911 and bring the patient back to the ED if:        A.  The seizure lasts longer than 5 minutes.       B.  The patient doesn't awaken shortly after the seizure  C.  The patient has new problems such as difficulty seeing, speaking or moving  D.  The patient was injured during the seizure  E.  The patient has a temperature over 102 F (39C)  F.  The patient vomited and now is having trouble breathing

## 2023-03-24 NOTE — Progress Notes (Signed)
NEUROLOGY FOLLOW UP OFFICE NOTE  Ashley [name] [id number] Feb 12, 1976  HISTORY OF PRESENT ILLNESS: I had the pleasure of seeing Ashley [name] in follow-up in the neurology clinic on 03/24/2023.  The patient was last seen 5 months ago for seizures secondary to stroke. She is alone in the office today.  Records and images were personally reviewed where available.  Since her last visit, she was admitted at [facility] from May 17-19, 2024 after a fall off a bench at a [employer]. Per notes, she slipped off, hit her head and lost consciousness. Initial notes indicate she had convulsive activity and was brought to the ER, however Discharge Summary states she did not lose consciousness. She states her mother reported she had a seizure because mother thinks everything is a seizure, however she states she remembers the entire episode. She does note her right leg felt funny and there may have been some spasms, but she is not sure if it was from the fall. She states it was not typical of her seizures. CT head showed small areas of subarachnoid hemorrhage along the right frontal sulci. CTA head no vessel abnormality seen to cause SAH. MRI brain reported stable mild right upper frontal lobe SAH, old left parietal lobe infarct. There were right superior frontal cavernomas noted by neurology (not on formal report). Prolonged overnight EEG was normal. She was discharged on home doses of Levetiracetam 2000mg  BID and Lamotrigine 200mg  BID. She was instructed to hold aspirin for a week, however she has not restarted it to discuss on today's visit.  She reports another fall last week while walking her dog, he ran off and she again fell on her right side. She has seen Ortho and has her right arm in a sling, scheduled for an MRI shoulder tomorrow. She denies any GTCs since 06/2018, she has not needed prn lorazepam. She denies any headaches, dizziness, vision changes.    History on Initial Assessment 12/10/2016: This is  a pleasant 47 yo RH woman with a history of left parietal ICH in 2016 with residual right foot drop, admitted February 14-15, 2018 for focal seizures. She had focal seizures in 2016 with EEG showing left PLEDs and has been on Keppra 1500mg  BID since then with no further focal motor seizures, however she would have occasional episodes around once a month where she could not get her words out, lasting 3-4 minutes. Sometimes she would get hot and have to sit but her speech would not be affected. On 11/27/16, she started having the same speech difficulties where she was making paraphasic errors, but it continued for 30 minutes and they decided to go to the ER. In the ER, she was noted to be aphasic, then as she was having a head CT, she suddenly had right gaze and head deviation, right arm and leg flexion and shaking for around 3 minutes. She was given IV Ativan but her right leg continued to shake in a rhythmic fashion. She was given additional Keppra and started on Vimpat with no further seizures, back to baseline in a couple of hours. She initially had some difficulties with the Vimpat 100mg  BID where she had hallucinations of hearing music or voices, but these have resolved over the past week. She denies any side effects on Keppra 1500mg  BID. She denies any dizziness, diplopia, gait instability. She has a right foot drop, denies any numbness/tingling on the right side but states it feels different that the other side. She denies any olfactory/gustatory hallucinations,  myoclonic jerks, episodes of staring/unresponsiveness, gaps in time. She has not had any further episodes of paraphasic errors since her hospitalization. She had a mild diffuse headache for a couple of days but this has resolved. She denies any dizziness, dysarthria/dysphagia, neck/back pain, bowel/bladder dysfunction.   Records from her hospitalization in August 2016 were reviewed. She has been seeing neurologist Dr. Pearlean Brownie for post-stroke and seizure  care since then, last visit was in August 2017. She had a severe headache for several days in August 2016 2 weeks post-partum. She then developed right-sided weakness, aphasia, then a focal seizure in the hospital. She was found to have a large left parietal ICH with IVH, cerebral edema, obstructive hydrocephalus and subarachnoid hemorrhage. MRA and MRV were unremarkable. Concern was for cerebral cortical vein thrombosis not seen on MRV, however RCVS was also a consideration. She was treated with hypertonic saline and stabilized. She had a seizure with EEG showing left PLEDs. She was started on Keppra. Hypercoagulable labs were normal. She was discharged to rehab and had another seizure on 06/01/16 with report of eyes rolling back and upper extremities shaking. Keppra dose was increased. She had a repeat MRI brain in September 2016 with resolving subacute left parietal hematoma. There was acute gyral swelling and T2 hyperintensity in the left hippocampal formation, left insula, and operculum, that resolved with repeat MRI 09/2015 showing resolution of these changes. She was initially having some headaches and low dose Topamax was briefly added, then with resolution of headaches, this was tapered off. She had been dealing with post-stroke depression and symptoms had improved, however with limitations on driving again, she is starting to feel depressed again. She has three young children, the youngest is 44 months old. She was not happy with neuro rehab and has a private physical therapist coming weekly where she pushes herself and has made a ton of improvement. She does not like to wear her AFO and her husband did not notice any difference in gait with the AFO.    Epilepsy Risk Factors:  History of large left parietal hematoma with encephalomalacia. Otherwise she had a normal birth and early development.  There is no history of febrile convulsions, CNS infections such as meningitis/encephalitis, significant  traumatic brain injury, neurosurgical procedures, or family history of seizures.   Diagnostic Data: I personally reviewed MRI brain 11/26/2016 which did not show any acute changes. There was left paramedian parietal encephalomalacia with extensive hemosiderin staining extending into the splenium of the corpus callosum, ex vacuo dilatation of the left lateral ventricle occipital horn, increased side of the temporal horn of the left lateral ventricle which may be due to decreased volume of the hippocampus. It is smaller in size with increased signal compared to the right.  EEG 11/27/16 showed mild left hemispheric slowing EEG 05/22/15: left hemisphere slowing, rare epileptiform discharges over the left anterior temporal region EEG 05/21/15: PLEDs on the left hemisphere, mainly involving the temporo-parietal region 43-hour ambulatory EEG in 08/2022 showed occasional left temporal slowing, occasional left frontotemporal epileptiform discharges seen exclusively in sleep. No seizures captured. Episode of right-sided numbness and shaking (not seen on video) did not show EEG correlate.    Prior AEDs: Topamax, Vimpat, oxcarbazepine  PAST MEDICAL HISTORY: Past Medical History:  Diagnosis Date   Aphasia following nontraumatic intracerebral hemorrhage 05/28/2015   Left parietal ICH Aug 2016    Cerebral venous thrombosis of cortical vein    Contracture of muscle ankle and foot 06/11/2015   Right heel cord  Cytotoxic cerebral edema    Generalized anxiety disorder 05/28/2015   Hypotension 06/02/2015   ICH (intracerebral hemorrhage)    IVH (intraventricular hemorrhage)    Left-sided intracerebral hemorrhage 06/04/2015   Major depressive disorder 03/23/2017   Right spastic hemiparesis 05/28/2015   Left ICH   Seizure disorder, nonconvulsive, with status epilepticus    last seizure early 2021   Sepsis 06/03/2015   UTI (urinary tract infection) 06/03/2015    MEDICATIONS: Current Outpatient Medications on File  Prior to Visit  Medication Sig Dispense Refill   Cholecalciferol (VITAMIN D) 2000 units CAPS Take 2,000 Units by mouth daily.     FLUoxetine HCl (PROZAC PO) Take 60 mg by mouth.     lamoTRIgine (LAMICTAL) 200 MG tablet Take 1 tablet (200 mg total) by mouth 2 (two) times daily. 180 tablet 3   levETIRAcetam (KEPPRA) 1000 MG tablet Take 2 tablets (2,000 mg total) by mouth 2 (two) times daily. 360 tablet 3   linaclotide (LINZESS) 145 MCG CAPS capsule Take 290 mcg by mouth daily before breakfast.     LORazepam (ATIVAN) 1 MG tablet TAKE 1 TABLET BY MOUTH AS NEEDED FOR SEIZURES 10 tablet 5   magnesium oxide (MAG-OX) 400 MG tablet Take 400 mg by mouth 2 (two) times daily.     mirtazapine (REMERON) 30 MG tablet      Multiple Vitamin (MULTIVITAMIN) tablet Take 1 tablet by mouth daily.      spironolactone (ALDACTONE) 50 MG tablet Take 50 mg by mouth 2 (two) times daily.     aspirin 81 MG chewable tablet Chew 81 mg by mouth daily. (Patient not taking: Reported on 03/24/2023)     No current facility-administered medications on file prior to visit.    ALLERGIES: No Known Allergies  FAMILY HISTORY: Family History  Problem Relation Age of Onset   Cancer Mother    Cancer Father        pancreatic   Colon cancer Neg Hx    Colon polyps Neg Hx    Esophageal cancer Neg Hx    Rectal cancer Neg Hx    Stomach cancer Neg Hx     SOCIAL HISTORY: Social History   Socioeconomic History   Marital status: Married    Spouse name: Not on file   Number of children: Not on file   Years of education: 16   Highest education level: Bachelor's degree (e.g., BA, AB, BS)  Occupational History   Not on file  Tobacco Use   Smoking status: Never   Smokeless tobacco: Never  Vaping Use   Vaping Use: Never used  Substance and Sexual Activity   Alcohol use: Yes    Comment: wine 1-2 times a month per pt   Drug use: No   Sexual activity: Yes    Birth control/protection: None    Comment: husband has vasectomy   Other Topics Concern   Not on file  Social History Narrative   Lives with husband and three kids in two story home      Right handed      Highest level of edu- bachelors   Social Determinants of Health   Financial Resource Strain: Not on file  Food Insecurity: Not on file  Transportation Needs: Not on file  Physical Activity: Not on file  Stress: Not on file  Social Connections: Not on file  Intimate Partner Violence: Not on file     PHYSICAL EXAM: Vitals:   03/24/23 1142  BP: (!) 96/50  Pulse: 72  SpO2: 98%   General: No acute distress Head:  Normocephalic/atraumatic Skin/Extremities: No rash, no edema. Right arm in sling. Right AFO Neurological Exam: alert and awake. No aphasia or dysarthria. Fund of knowledge is appropriate.  Attention and concentration are normal.   Cranial nerves: Pupils equal, round. Extraocular movements intact with no nystagmus. Visual fields full.  No facial asymmetry.  Motor: Increased tone on right LE.Muscle strength 5/5 throughout with pain on right shoulder abduction. Finger to nose testing intact.  Gait spastic hemiparetic gait in on right (chronic).    IMPRESSION: This is a pleasant 47 yo RH woman with a history of large left ICH and focal seizures in August 2016 with residual right foot drop. Her last GTC was in 06/2018. She was recently admitted to [facility] after a fall with small right frontal SAH. Initial report of seizure after fall, however she (as well as hospital notes), indicate that there was no loss of consciousness or seizure activity. Follow-up head CT without contrast will be ordered for [facility], we will plan to restart aspirin after CT. Continue Levetiracetam 2000mg  BID and Lamotrigine 200mg  BID. She has prn lorazepam for seizure rescue. She is aware of [place] driving laws to stop driving after a seizure until 6 months seizure-free. Follow-up in 6 months, call for any changes.    Thank you for allowing me to participate in her care.   Please do not hesitate to call for any questions or concerns.   Patrcia Dolly, M.D.   CC: Dr. Clelia Croft

## 2023-03-25 DIAGNOSIS — M25511 Pain in right shoulder: Secondary | ICD-10-CM | POA: Diagnosis not present

## 2023-03-31 ENCOUNTER — Ambulatory Visit
Admission: RE | Admit: 2023-03-31 | Discharge: 2023-03-31 | Disposition: A | Discharge status: Home / Self Care | Payer: PPO | Source: Ambulatory Visit | Attending: Neurology | Admitting: Neurology

## 2023-03-31 DIAGNOSIS — S066X0D Traumatic subarachnoid hemorrhage without loss of consciousness, subsequent encounter: Secondary | ICD-10-CM

## 2023-03-31 DIAGNOSIS — G9389 Other specified disorders of brain: Secondary | ICD-10-CM | POA: Diagnosis not present

## 2023-03-31 DIAGNOSIS — R569 Unspecified convulsions: Secondary | ICD-10-CM | POA: Diagnosis not present

## 2023-04-10 ENCOUNTER — Encounter: Payer: Self-pay | Admitting: Neurology

## 2023-04-15 DIAGNOSIS — F4322 Adjustment disorder with anxiety: Secondary | ICD-10-CM | POA: Diagnosis not present

## 2023-04-24 DIAGNOSIS — M25511 Pain in right shoulder: Secondary | ICD-10-CM | POA: Diagnosis not present

## 2023-05-06 DIAGNOSIS — F4322 Adjustment disorder with anxiety: Secondary | ICD-10-CM | POA: Diagnosis not present

## 2023-05-13 DIAGNOSIS — F4322 Adjustment disorder with anxiety: Secondary | ICD-10-CM | POA: Diagnosis not present

## 2023-05-27 NOTE — Telephone Encounter (Signed)
Refill provided

## 2023-06-02 DIAGNOSIS — D485 Neoplasm of uncertain behavior of skin: Secondary | ICD-10-CM | POA: Diagnosis not present

## 2023-06-05 DIAGNOSIS — L308 Other specified dermatitis: Secondary | ICD-10-CM | POA: Diagnosis not present

## 2023-06-17 DIAGNOSIS — F4322 Adjustment disorder with anxiety: Secondary | ICD-10-CM | POA: Diagnosis not present

## 2023-06-18 DIAGNOSIS — L814 Other melanin hyperpigmentation: Secondary | ICD-10-CM | POA: Diagnosis not present

## 2023-06-18 DIAGNOSIS — D225 Melanocytic nevi of trunk: Secondary | ICD-10-CM | POA: Diagnosis not present

## 2023-06-18 DIAGNOSIS — L821 Other seborrheic keratosis: Secondary | ICD-10-CM | POA: Diagnosis not present

## 2023-06-18 DIAGNOSIS — L905 Scar conditions and fibrosis of skin: Secondary | ICD-10-CM | POA: Diagnosis not present

## 2023-06-18 DIAGNOSIS — L940 Localized scleroderma [morphea]: Secondary | ICD-10-CM | POA: Diagnosis not present

## 2023-06-18 DIAGNOSIS — L710 Perioral dermatitis: Secondary | ICD-10-CM | POA: Diagnosis not present

## 2023-06-18 DIAGNOSIS — Z129 Encounter for screening for malignant neoplasm, site unspecified: Secondary | ICD-10-CM | POA: Diagnosis not present

## 2023-06-19 DIAGNOSIS — F331 Major depressive disorder, recurrent, moderate: Secondary | ICD-10-CM | POA: Diagnosis not present

## 2023-06-19 DIAGNOSIS — F419 Anxiety disorder, unspecified: Secondary | ICD-10-CM | POA: Diagnosis not present

## 2023-06-23 DIAGNOSIS — M21371 Foot drop, right foot: Secondary | ICD-10-CM | POA: Diagnosis not present

## 2023-06-23 DIAGNOSIS — M21171 Varus deformity, not elsewhere classified, right ankle: Secondary | ICD-10-CM | POA: Diagnosis not present

## 2023-06-25 DIAGNOSIS — S42201A Unspecified fracture of upper end of right humerus, initial encounter for closed fracture: Secondary | ICD-10-CM | POA: Diagnosis not present

## 2023-06-30 ENCOUNTER — Encounter: Payer: Self-pay | Admitting: Neurology

## 2023-07-15 DIAGNOSIS — M25511 Pain in right shoulder: Secondary | ICD-10-CM | POA: Diagnosis not present

## 2023-07-17 DIAGNOSIS — M25511 Pain in right shoulder: Secondary | ICD-10-CM | POA: Diagnosis not present

## 2023-07-20 DIAGNOSIS — M25511 Pain in right shoulder: Secondary | ICD-10-CM | POA: Diagnosis not present

## 2023-07-22 DIAGNOSIS — M25511 Pain in right shoulder: Secondary | ICD-10-CM | POA: Diagnosis not present

## 2023-08-04 DIAGNOSIS — M25511 Pain in right shoulder: Secondary | ICD-10-CM | POA: Diagnosis not present

## 2023-08-10 DIAGNOSIS — M25511 Pain in right shoulder: Secondary | ICD-10-CM | POA: Diagnosis not present

## 2023-08-12 DIAGNOSIS — M25511 Pain in right shoulder: Secondary | ICD-10-CM | POA: Diagnosis not present

## 2023-08-17 DIAGNOSIS — M25511 Pain in right shoulder: Secondary | ICD-10-CM | POA: Diagnosis not present

## 2023-08-19 DIAGNOSIS — M25511 Pain in right shoulder: Secondary | ICD-10-CM | POA: Diagnosis not present

## 2023-08-20 ENCOUNTER — Other Ambulatory Visit (HOSPITAL_BASED_OUTPATIENT_CLINIC_OR_DEPARTMENT_OTHER): Payer: Self-pay

## 2023-08-20 MED ORDER — TACROLIMUS 0.1 % EX OINT
TOPICAL_OINTMENT | Freq: Every day | CUTANEOUS | 5 refills | Status: DC
  Filled 2023-08-20: qty 90, 30d supply, fill #0

## 2023-08-20 MED ORDER — LAMOTRIGINE 200 MG PO TABS
200.0000 mg | ORAL_TABLET | Freq: Two times a day (BID) | ORAL | 4 refills | Status: DC
  Filled 2023-08-20: qty 180, 90d supply, fill #0

## 2023-08-20 MED ORDER — LEVETIRACETAM 1000 MG PO TABS
2000.0000 mg | ORAL_TABLET | Freq: Two times a day (BID) | ORAL | 4 refills | Status: DC
  Filled 2023-08-20: qty 360, 90d supply, fill #0

## 2023-08-20 MED ORDER — FLUOXETINE HCL 20 MG PO CAPS
20.0000 mg | ORAL_CAPSULE | Freq: Every morning | ORAL | 1 refills | Status: DC
  Filled 2023-08-20: qty 90, 90d supply, fill #0

## 2023-08-20 MED ORDER — HYDROXYZINE HCL 25 MG PO TABS
25.0000 mg | ORAL_TABLET | Freq: Every day | ORAL | 1 refills | Status: DC
  Filled 2023-08-20: qty 60, 30d supply, fill #0

## 2023-08-20 MED ORDER — TRETINOIN 0.025 % EX CREA
TOPICAL_CREAM | Freq: Every day | CUTANEOUS | 11 refills | Status: DC
  Filled 2023-08-20: qty 20, 12d supply, fill #0

## 2023-08-20 MED ORDER — LAMOTRIGINE 200 MG PO TABS
200.0000 mg | ORAL_TABLET | Freq: Two times a day (BID) | ORAL | 3 refills | Status: DC
  Filled 2023-08-20: qty 60, 30d supply, fill #0

## 2023-08-20 MED ORDER — PIMECROLIMUS 1 % EX CREA
TOPICAL_CREAM | Freq: Two times a day (BID) | CUTANEOUS | 6 refills | Status: DC
  Filled 2023-08-20: qty 30, 15d supply, fill #0

## 2023-08-20 MED ORDER — METRONIDAZOLE 0.75 % EX CREA
TOPICAL_CREAM | Freq: Every day | CUTANEOUS | 3 refills | Status: DC
  Filled 2023-08-20: qty 45, 30d supply, fill #0

## 2023-08-20 MED ORDER — FLUOXETINE HCL 40 MG PO CAPS
40.0000 mg | ORAL_CAPSULE | Freq: Every morning | ORAL | 1 refills | Status: DC
  Filled 2023-08-20 – 2023-09-17 (×2): qty 90, 90d supply, fill #0

## 2023-08-20 MED ORDER — LINACLOTIDE 290 MCG PO CAPS
290.0000 ug | ORAL_CAPSULE | Freq: Every day | ORAL | 12 refills | Status: DC
  Filled 2023-08-20: qty 90, 90d supply, fill #0
  Filled 2023-10-19: qty 30, 30d supply, fill #1
  Filled 2023-10-21: qty 90, 90d supply, fill #1
  Filled 2023-11-28 – 2023-11-30 (×2): qty 30, 30d supply, fill #1

## 2023-08-20 MED ORDER — TACROLIMUS 0.1 % EX OINT: TOPICAL_OINTMENT | Freq: Every day | CUTANEOUS | 6 refills | Status: DC

## 2023-08-20 MED ORDER — PREDNISONE 5 MG (21) PO TBPK
ORAL_TABLET | ORAL | 1 refills | Status: DC
  Filled 2023-08-20: qty 21, 6d supply, fill #0

## 2023-08-21 ENCOUNTER — Other Ambulatory Visit (HOSPITAL_BASED_OUTPATIENT_CLINIC_OR_DEPARTMENT_OTHER): Payer: Self-pay

## 2023-08-21 ENCOUNTER — Other Ambulatory Visit (HOSPITAL_COMMUNITY): Payer: Self-pay

## 2023-08-24 DIAGNOSIS — M25511 Pain in right shoulder: Secondary | ICD-10-CM | POA: Diagnosis not present

## 2023-08-27 DIAGNOSIS — M25511 Pain in right shoulder: Secondary | ICD-10-CM | POA: Diagnosis not present

## 2023-08-31 DIAGNOSIS — M25511 Pain in right shoulder: Secondary | ICD-10-CM | POA: Diagnosis not present

## 2023-09-03 DIAGNOSIS — Z01419 Encounter for gynecological examination (general) (routine) without abnormal findings: Secondary | ICD-10-CM | POA: Diagnosis not present

## 2023-09-03 DIAGNOSIS — Z1231 Encounter for screening mammogram for malignant neoplasm of breast: Secondary | ICD-10-CM | POA: Diagnosis not present

## 2023-09-08 DIAGNOSIS — R2681 Unsteadiness on feet: Secondary | ICD-10-CM | POA: Diagnosis not present

## 2023-09-08 DIAGNOSIS — M6281 Muscle weakness (generalized): Secondary | ICD-10-CM | POA: Diagnosis not present

## 2023-09-17 ENCOUNTER — Other Ambulatory Visit (HOSPITAL_BASED_OUTPATIENT_CLINIC_OR_DEPARTMENT_OTHER): Payer: Self-pay

## 2023-09-17 DIAGNOSIS — F419 Anxiety disorder, unspecified: Secondary | ICD-10-CM | POA: Diagnosis not present

## 2023-09-17 DIAGNOSIS — F331 Major depressive disorder, recurrent, moderate: Secondary | ICD-10-CM | POA: Diagnosis not present

## 2023-09-17 MED ORDER — FLUOXETINE HCL 40 MG PO CAPS
40.0000 mg | ORAL_CAPSULE | Freq: Every morning | ORAL | 0 refills | Status: DC
  Filled 2023-12-19: qty 90, 90d supply, fill #0

## 2023-09-17 MED ORDER — MIRTAZAPINE 30 MG PO TABS
30.0000 mg | ORAL_TABLET | Freq: Every day | ORAL | 0 refills | Status: DC
  Filled 2023-09-17: qty 90, 90d supply, fill #0

## 2023-09-17 MED ORDER — FLUOXETINE HCL 20 MG PO CAPS
20.0000 mg | ORAL_CAPSULE | Freq: Every morning | ORAL | 0 refills | Status: DC
  Filled 2023-11-28: qty 90, 90d supply, fill #0

## 2023-09-22 DIAGNOSIS — M6281 Muscle weakness (generalized): Secondary | ICD-10-CM | POA: Diagnosis not present

## 2023-09-22 DIAGNOSIS — R2681 Unsteadiness on feet: Secondary | ICD-10-CM | POA: Diagnosis not present

## 2023-09-24 ENCOUNTER — Other Ambulatory Visit (HOSPITAL_BASED_OUTPATIENT_CLINIC_OR_DEPARTMENT_OTHER): Payer: Self-pay

## 2023-09-24 DIAGNOSIS — K130 Diseases of lips: Secondary | ICD-10-CM | POA: Diagnosis not present

## 2023-09-24 DIAGNOSIS — L940 Localized scleroderma [morphea]: Secondary | ICD-10-CM | POA: Diagnosis not present

## 2023-09-24 MED ORDER — HYDROCORTISONE 2.5 % EX CREA
1.0000 | TOPICAL_CREAM | Freq: Two times a day (BID) | CUTANEOUS | 11 refills | Status: DC | PRN
  Filled 2023-09-24: qty 30, 15d supply, fill #0

## 2023-09-24 MED ORDER — KETOCONAZOLE 2 % EX CREA
1.0000 | TOPICAL_CREAM | Freq: Two times a day (BID) | CUTANEOUS | 11 refills | Status: DC | PRN
  Filled 2023-09-24: qty 60, 30d supply, fill #0

## 2023-09-25 DIAGNOSIS — M6281 Muscle weakness (generalized): Secondary | ICD-10-CM | POA: Diagnosis not present

## 2023-09-25 DIAGNOSIS — R2681 Unsteadiness on feet: Secondary | ICD-10-CM | POA: Diagnosis not present

## 2023-09-30 DIAGNOSIS — M6281 Muscle weakness (generalized): Secondary | ICD-10-CM | POA: Diagnosis not present

## 2023-09-30 DIAGNOSIS — R2681 Unsteadiness on feet: Secondary | ICD-10-CM | POA: Diagnosis not present

## 2023-10-02 DIAGNOSIS — K5900 Constipation, unspecified: Secondary | ICD-10-CM | POA: Diagnosis not present

## 2023-10-02 DIAGNOSIS — R7989 Other specified abnormal findings of blood chemistry: Secondary | ICD-10-CM | POA: Diagnosis not present

## 2023-10-02 DIAGNOSIS — R2681 Unsteadiness on feet: Secondary | ICD-10-CM | POA: Diagnosis not present

## 2023-10-02 DIAGNOSIS — F419 Anxiety disorder, unspecified: Secondary | ICD-10-CM | POA: Diagnosis not present

## 2023-10-02 DIAGNOSIS — M6281 Muscle weakness (generalized): Secondary | ICD-10-CM | POA: Diagnosis not present

## 2023-10-02 DIAGNOSIS — Z79899 Other long term (current) drug therapy: Secondary | ICD-10-CM | POA: Diagnosis not present

## 2023-10-05 DIAGNOSIS — M6281 Muscle weakness (generalized): Secondary | ICD-10-CM | POA: Diagnosis not present

## 2023-10-05 DIAGNOSIS — R2681 Unsteadiness on feet: Secondary | ICD-10-CM | POA: Diagnosis not present

## 2023-10-08 DIAGNOSIS — R2681 Unsteadiness on feet: Secondary | ICD-10-CM | POA: Diagnosis not present

## 2023-10-08 DIAGNOSIS — M6281 Muscle weakness (generalized): Secondary | ICD-10-CM | POA: Diagnosis not present

## 2023-10-12 DIAGNOSIS — M6281 Muscle weakness (generalized): Secondary | ICD-10-CM | POA: Diagnosis not present

## 2023-10-12 DIAGNOSIS — R2681 Unsteadiness on feet: Secondary | ICD-10-CM | POA: Diagnosis not present

## 2023-10-13 DIAGNOSIS — G40909 Epilepsy, unspecified, not intractable, without status epilepticus: Secondary | ICD-10-CM | POA: Diagnosis not present

## 2023-10-13 DIAGNOSIS — F419 Anxiety disorder, unspecified: Secondary | ICD-10-CM | POA: Diagnosis not present

## 2023-10-13 DIAGNOSIS — Z8679 Personal history of other diseases of the circulatory system: Secondary | ICD-10-CM | POA: Diagnosis not present

## 2023-10-13 DIAGNOSIS — Z803 Family history of malignant neoplasm of breast: Secondary | ICD-10-CM | POA: Diagnosis not present

## 2023-10-13 DIAGNOSIS — R7401 Elevation of levels of liver transaminase levels: Secondary | ICD-10-CM | POA: Diagnosis not present

## 2023-10-13 DIAGNOSIS — F324 Major depressive disorder, single episode, in partial remission: Secondary | ICD-10-CM | POA: Diagnosis not present

## 2023-10-13 DIAGNOSIS — Q283 Other malformations of cerebral vessels: Secondary | ICD-10-CM | POA: Diagnosis not present

## 2023-10-13 DIAGNOSIS — K5900 Constipation, unspecified: Secondary | ICD-10-CM | POA: Diagnosis not present

## 2023-10-13 DIAGNOSIS — Z23 Encounter for immunization: Secondary | ICD-10-CM | POA: Diagnosis not present

## 2023-10-13 DIAGNOSIS — I69320 Aphasia following cerebral infarction: Secondary | ICD-10-CM | POA: Diagnosis not present

## 2023-10-13 DIAGNOSIS — R82998 Other abnormal findings in urine: Secondary | ICD-10-CM | POA: Diagnosis not present

## 2023-10-13 DIAGNOSIS — Z0000 Encounter for general adult medical examination without abnormal findings: Secondary | ICD-10-CM | POA: Diagnosis not present

## 2023-10-13 DIAGNOSIS — Z1339 Encounter for screening examination for other mental health and behavioral disorders: Secondary | ICD-10-CM | POA: Diagnosis not present

## 2023-10-13 DIAGNOSIS — I69351 Hemiplegia and hemiparesis following cerebral infarction affecting right dominant side: Secondary | ICD-10-CM | POA: Diagnosis not present

## 2023-10-13 DIAGNOSIS — Z1331 Encounter for screening for depression: Secondary | ICD-10-CM | POA: Diagnosis not present

## 2023-10-13 DIAGNOSIS — G3184 Mild cognitive impairment, so stated: Secondary | ICD-10-CM | POA: Diagnosis not present

## 2023-10-13 DIAGNOSIS — Z8781 Personal history of (healed) traumatic fracture: Secondary | ICD-10-CM | POA: Diagnosis not present

## 2023-10-15 DIAGNOSIS — M6281 Muscle weakness (generalized): Secondary | ICD-10-CM | POA: Diagnosis not present

## 2023-10-15 DIAGNOSIS — R2681 Unsteadiness on feet: Secondary | ICD-10-CM | POA: Diagnosis not present

## 2023-10-16 ENCOUNTER — Other Ambulatory Visit (HOSPITAL_BASED_OUTPATIENT_CLINIC_OR_DEPARTMENT_OTHER): Payer: Self-pay

## 2023-10-16 ENCOUNTER — Ambulatory Visit (INDEPENDENT_AMBULATORY_CARE_PROVIDER_SITE_OTHER): Payer: PPO | Admitting: Neurology

## 2023-10-16 ENCOUNTER — Other Ambulatory Visit: Payer: Self-pay

## 2023-10-16 ENCOUNTER — Encounter: Payer: Self-pay | Admitting: Neurology

## 2023-10-16 VITALS — BP 94/52 | HR 72 | Ht 63.0 in | Wt 117.0 lb

## 2023-10-16 DIAGNOSIS — G40109 Localization-related (focal) (partial) symptomatic epilepsy and epileptic syndromes with simple partial seizures, not intractable, without status epilepticus: Secondary | ICD-10-CM | POA: Diagnosis not present

## 2023-10-16 MED ORDER — LEVETIRACETAM 1000 MG PO TABS
2000.0000 mg | ORAL_TABLET | Freq: Two times a day (BID) | ORAL | 4 refills | Status: DC
  Filled 2023-10-16 – 2023-10-19 (×2): qty 360, 90d supply, fill #0
  Filled 2024-01-30: qty 360, 90d supply, fill #1
  Filled 2024-04-27: qty 360, 90d supply, fill #2

## 2023-10-16 MED ORDER — LAMOTRIGINE 200 MG PO TABS
200.0000 mg | ORAL_TABLET | Freq: Two times a day (BID) | ORAL | 4 refills | Status: DC
  Filled 2023-10-16: qty 180, 90d supply, fill #0
  Filled 2024-01-30: qty 180, 90d supply, fill #1
  Filled 2024-04-27: qty 180, 90d supply, fill #2

## 2023-10-16 NOTE — Progress Notes (Signed)
 NEUROLOGY FOLLOW UP OFFICE NOTE  Ashley [name] [id number] [birth year]  HISTORY OF PRESENT ILLNESS: I had the pleasure of seeing Ashley [name] in follow-up in the neurology clinic on 10/16/2023.  The patient was last seen 6 months ago for seizures secondary to stroke. Her 49 year old son Franchot is present, she cannot fully disclose information in front of him but she states she is doing well. She denies any GTCs since 2019 on Lamotrigine  200mg  BID and Levetiracetam  2000mg  BID, no side effects. She denies any staring/unresponsive episodes, gaps in time, olfactory/gustatory hallucinations, myoclonic jerks. There is a little numbness and tingling in the right leg when she gets worked up. She injured her right shoulder and was doing PT on her right arm, but now also does PT on her right leg with good effect. She tripped in the dark 2 days ago, no injuries. She has a couple of mild headaches with good response to Tylenol . Sleep is good with mirtazapine , she gets 8 hours. She is on Prozac  60mg  daily, mood is up and down. She has not needed the lorazepam  much and does not need refills.   She sustained a right frontal SAH in May 2024 from a fall, I personally reviewed head CT without contrast 03/2023 with resolution of SAH. There is chronic encephalomalacia in the left parasagittal cerebral hemisphere, posterosuperior frontal lobe and left parietal lobe.    History on Initial Assessment 12/10/2016: This is a pleasant 48 yo RH woman with a history of left parietal ICH in 2016 with residual right foot drop, admitted February 14-15, 2018 for focal seizures. She had focal seizures in 2016 with EEG showing left PLEDs and has been on Keppra  1500mg  BID since then with no further focal motor seizures, however she would have occasional episodes around once a month where she could not get her words out, lasting 3-4 minutes. Sometimes she would get hot and have to sit but her speech would not be affected. On 11/27/16, she  started having the same speech difficulties where she was making paraphasic errors, but it continued for 30 minutes and they decided to go to the ER. In the ER, she was noted to be aphasic, then as she was having a head CT, she suddenly had right gaze and head deviation, right arm and leg flexion and shaking for around 3 minutes. She was given IV Ativan  but her right leg continued to shake in a rhythmic fashion. She was given additional Keppra  and started on Vimpat  with no further seizures, back to baseline in a couple of hours. She initially had some difficulties with the Vimpat  100mg  BID where she had hallucinations of hearing music or voices, but these have resolved over the past week. She denies any side effects on Keppra  1500mg  BID. She denies any dizziness, diplopia, gait instability. She has a right foot drop, denies any numbness/tingling on the right side but states it feels different that the other side. She denies any olfactory/gustatory hallucinations, myoclonic jerks, episodes of staring/unresponsiveness, gaps in time. She has not had any further episodes of paraphasic errors since her hospitalization. She had a mild diffuse headache for a couple of days but this has resolved. She denies any dizziness, dysarthria/dysphagia, neck/back pain, bowel/bladder dysfunction.   Records from her hospitalization in August 2016 were reviewed. She has been seeing neurologist Dr. Rosemarie for post-stroke and seizure care since then, last visit was in August 2017. She had a severe headache for several days in August 2016 2 weeks  post-partum. She then developed right-sided weakness, aphasia, then a focal seizure in the hospital. She was found to have a large left parietal ICH with IVH, cerebral edema, obstructive hydrocephalus and subarachnoid hemorrhage. MRA and MRV were unremarkable. Concern was for cerebral cortical vein thrombosis not seen on MRV, however RCVS was also a consideration. She was treated with hypertonic  saline and stabilized. She had a seizure with EEG showing left PLEDs. She was started on Keppra . Hypercoagulable labs were normal. She was discharged to rehab and had another seizure on 06/01/16 with report of eyes rolling back and upper extremities shaking. Keppra  dose was increased. She had a repeat MRI brain in September 2016 with resolving subacute left parietal hematoma. There was acute gyral swelling and T2 hyperintensity in the left hippocampal formation, left insula, and operculum, that resolved with repeat MRI 09/2015 showing resolution of these changes. She was initially having some headaches and low dose Topamax  was briefly added, then with resolution of headaches, this was tapered off. She had been dealing with post-stroke depression and symptoms had improved, however with limitations on driving again, she is starting to feel depressed again. She has three young children, the youngest is 51 months old. She was not happy with neuro rehab and has a private physical therapist coming weekly where she pushes herself and has made a ton of improvement. She does not like to wear her AFO and her husband did not notice any difference in gait with the AFO.    Epilepsy Risk Factors:  History of large left parietal hematoma with encephalomalacia. Otherwise she had a normal birth and early development.  There is no history of febrile convulsions, CNS infections such as meningitis/encephalitis, significant traumatic brain injury, neurosurgical procedures, or family history of seizures.   Diagnostic Data: I personally reviewed MRI brain 11/26/2016 which did not show any acute changes. There was left paramedian parietal encephalomalacia with extensive hemosiderin staining extending into the splenium of the corpus callosum, ex vacuo dilatation of the left lateral ventricle occipital horn, increased side of the temporal horn of the left lateral ventricle which may be due to decreased volume of the hippocampus. It is  smaller in size with increased signal compared to the right.  EEG 11/27/16 showed mild left hemispheric slowing EEG 05/22/15: left hemisphere slowing, rare epileptiform discharges over the left anterior temporal region EEG 05/21/15: PLEDs on the left hemisphere, mainly involving the temporo-parietal region 43-hour ambulatory EEG in 08/2022 showed occasional left temporal slowing, occasional left frontotemporal epileptiform discharges seen exclusively in sleep. No seizures captured. Episode of right-sided numbness and shaking (not seen on video) did not show EEG correlate.    Prior AEDs: Topamax , Vimpat , oxcarbazepine   PAST MEDICAL HISTORY: Past Medical History:  Diagnosis Date   Aphasia following nontraumatic intracerebral hemorrhage 05/28/2015   Left parietal ICH Aug 2016    Cerebral venous thrombosis of cortical vein    Contracture of muscle ankle and foot 06/11/2015   Right heel cord    Cytotoxic cerebral edema    Generalized anxiety disorder 05/28/2015   Hypotension 06/02/2015   ICH (intracerebral hemorrhage)    IVH (intraventricular hemorrhage)    Left-sided intracerebral hemorrhage 06/04/2015   Major depressive disorder 03/23/2017   Right spastic hemiparesis 05/28/2015   Left ICH   Seizure disorder, nonconvulsive, with status epilepticus    last seizure early 2021   Sepsis 06/03/2015   UTI (urinary tract infection) 06/03/2015    MEDICATIONS: Current Outpatient Medications on File Prior to Visit  Medication  Sig Dispense Refill   aspirin  81 MG chewable tablet Chew 81 mg by mouth daily.     Cholecalciferol (VITAMIN D) 2000 units CAPS Take 2,000 Units by mouth daily.     FLUoxetine  (PROZAC ) 20 MG capsule Take 1 capsule (20 mg total) by mouth in the morning. Take with 40 mg capsule for total of 60 mg daily 90 capsule 0   FLUoxetine  (PROZAC ) 40 MG capsule Take 1 capsule (40 mg total) by mouth in the morning. Take with 20 mg capsule for total of 60 mg. 90 capsule 1   FLUoxetine  (PROZAC ) 40  MG capsule Take 1 capsule (40 mg total) by mouth in the morning. Take with 20 mg capsule for total of 60 mg daily 90 capsule 0   FLUoxetine  HCl (PROZAC  PO) Take 60 mg by mouth.     hydrocortisone  2.5 % cream Mix with ketoconazole  cream and apply twice a day as needed for rash at corner of mouth. 30 g 11   hydrOXYzine  (ATARAX ) 25 MG tablet Take 1-2 tablet (25 mg total) by mouth daily as needed for anxiety. 60 tablet 1   ketoconazole  (NIZORAL ) 2 % cream Mix with hydrocortisone  cream and apply twice a day as needed for rash at corner of mouth. 60 g 11   lamoTRIgine  (LAMICTAL ) 200 MG tablet Take 1 tablet (200 mg total) by mouth 2 (two) times daily. 180 tablet 3   lamoTRIgine  (LAMICTAL ) 200 MG tablet Take 1 tablet (200 mg total) by mouth 2 (two) times daily. 180 tablet 4   levETIRAcetam  (KEPPRA ) 1000 MG tablet Take 2 tablets (2,000 mg total) by mouth 2 (two) times daily. 360 tablet 3   levETIRAcetam  (KEPPRA ) 1000 MG tablet Take 2 tablets (2,000 mg total) by mouth 2 (two) times daily. 360 tablet 4   linaclotide  (LINZESS ) 145 MCG CAPS capsule Take 290 mcg by mouth daily before breakfast.     linaclotide  (LINZESS ) 290 MCG CAPS capsule Take 1 capsule (290 mcg total) by mouth daily 30 minutes before first meal of the day on an empty stomach. 30 capsule 12   LORazepam  (ATIVAN ) 1 MG tablet TAKE 1 TABLET BY MOUTH AS NEEDED FOR SEIZURES 10 tablet 5   magnesium  oxide (MAG-OX) 400 MG tablet Take 400 mg by mouth 2 (two) times daily.     metroNIDAZOLE  (METROCREAM ) 0.75 % cream Apply topically daily. 45 g 3   mirtazapine  (REMERON ) 30 MG tablet      mirtazapine  (REMERON ) 30 MG tablet Take 1 tablet (30 mg total) by mouth at bedtime. 90 tablet 0   Multiple Vitamin (MULTIVITAMIN) tablet Take 1 tablet by mouth daily.      pimecrolimus  (ELIDEL ) 1 % cream Apply topically 2 (two) times daily. 30 g 6   tacrolimus  (PROTOPIC ) 0.1 % ointment Apply topically at bedtime. 90 g 5   tacrolimus  (PROTOPIC ) 0.1 % ointment Apply a thin  layer topically to the face every night at bedtime. 100 g 6   tretinoin  (RETIN-A ) 0.025 % cream Apply topically daily. 20 g 11   No current facility-administered medications on file prior to visit.    ALLERGIES: No Known Allergies  FAMILY HISTORY: Family History  Problem Relation Age of Onset   Cancer Mother    Cancer Father        pancreatic   Colon cancer Neg Hx    Colon polyps Neg Hx    Esophageal cancer Neg Hx    Rectal cancer Neg Hx    Stomach cancer Neg Hx  SOCIAL HISTORY: Social History   Socioeconomic History   Marital status: Married    Spouse name: Not on file   Number of children: Not on file   Years of education: 16   Highest education level: Bachelor's degree (e.g., BA, AB, BS)  Occupational History   Not on file  Tobacco Use   Smoking status: Never   Smokeless tobacco: Never  Vaping Use   Vaping status: Never Used  Substance and Sexual Activity   Alcohol  use: Yes    Comment: wine 1-2 times a month per pt   Drug use: No   Sexual activity: Yes    Birth control/protection: None    Comment: husband has vasectomy  Other Topics Concern   Not on file  Social History Narrative   ** Merged History Encounter **       Lives with husband and three kids in two story home  Right handed  Highest level of edu- bachelors   Social Drivers of Health   Financial Resource Strain: Not on file  Food Insecurity: No Food Insecurity (02/27/2023)   Received from [facility], [facility]   Hunger Vital Sign    Worried About Running Out of Food in the Last Year: Never true    Ran Out of Food in the Last Year: Never true  Transportation Needs: No Transportation Needs (02/27/2023)   Received from [employer], [facility]   PRAPARE - Transportation    Lack of Transportation (Medical): No    Lack of Transportation (Non-Medical): No  Physical Activity: Not on file  Stress: Stress Concern Present (02/27/2023)   Received from [facility], [facility] of Occupational [facility] - Occupational Stress Questionnaire    Feeling of Stress : To some extent  Social Connections: Unknown (07/21/2022)   Received from [facility], [facility]   Social Network    Social Network: Not on file  Intimate Partner Violence: Not At Risk (02/27/2023)   Received [facility], [facility]   HITS    Over the last 12 months how often did your partner physically hurt you?: Never    Over the last 12 months how often did your partner insult you or talk down to you?: Never    Over the last 12 months how often did your partner threaten you with physical harm?: Never    Over the last 12 months how often did your partner scream or curse at you?: Never     PHYSICAL EXAM: Vitals:   10/16/23 1402  BP: (!) 94/52  Pulse: 72  SpO2: 99%   General: No acute distress Head:  Normocephalic/atraumatic Skin/Extremities: No rash, no edema Neurological Exam: alert and awake. No aphasia or dysarthria. Fund of knowledge is appropriate. Attention and concentration are normal.   Cranial nerves: Pupils equal, round. Extraocular movements intact with no nystagmus. Visual fields full.  No facial asymmetry.  Motor: increased tone on right UE and LE, muscle strength 5/5 throughout with no pronator drift.   Finger to nose testing intact.  Gait narrow-based and steady, able to tandem walk adequately.  Romberg negative.   IMPRESSION: This is a pleasant 48 yo RH woman with a history of large left ICH and focal seizures in August 2016 with residual right foot drop. Her last GTC was in 06/2018. She is doing well on Levetiracetam  2000mg  BID and Lamotrigine  200mg  BID, refills sent. She has not needed prn lorazepam  and will call if refills are needed. She is  aware of [place] driving laws to stop driving after a seizure until 6 months seizure-free. Follow-up in 6 months, call for any changes.    Thank you for allowing me to participate in her care.  Please do not hesitate to  call for any questions or concerns.    Darice Shivers, M.D.   CC: Dr. Loreli

## 2023-10-16 NOTE — Patient Instructions (Signed)
Good to see you! Continue all your medications. Follow-up in 6 months, call for any changes.  Seizure Precautions: 1. If medication has been prescribed for you to prevent seizures, take it exactly as directed.  Do not stop taking the medicine without talking to your doctor first, even if you have not had a seizure in a long time.   2. Avoid activities in which a seizure would cause danger to yourself or to others.  Don't operate dangerous machinery, swim alone, or climb in high or dangerous places, such as on ladders, roofs, or girders.  Do not drive unless your doctor says you may.  3. If you have any warning that you may have a seizure, lay down in a safe place where you can't hurt yourself.    4.  No driving for 6 months from last seizure, as per [facility].   Please refer to the following link on the [place] website for more information: http://[email or web address].org/[email or web address]/driving/drivingu.cfm   5.  Maintain good sleep hygiene. Avoid alcohol.  6.  Contact your doctor if you have any problems that may be related to the medicine you are taking.  7.  Call 911 and bring the patient back to the ED if:        A.  The seizure lasts longer than 5 minutes.       B.  The patient doesn't awaken shortly after the seizure  C.  The patient has new problems such as difficulty seeing, speaking or moving  D.  The patient was injured during the seizure  E.  The patient has a temperature over 102 F (39C)  F.  The patient vomited and now is having trouble breathing

## 2023-10-19 ENCOUNTER — Other Ambulatory Visit (HOSPITAL_BASED_OUTPATIENT_CLINIC_OR_DEPARTMENT_OTHER): Payer: Self-pay

## 2023-10-19 ENCOUNTER — Other Ambulatory Visit: Payer: Self-pay

## 2023-10-21 ENCOUNTER — Other Ambulatory Visit (HOSPITAL_BASED_OUTPATIENT_CLINIC_OR_DEPARTMENT_OTHER): Payer: Self-pay

## 2023-10-21 DIAGNOSIS — R2681 Unsteadiness on feet: Secondary | ICD-10-CM | POA: Diagnosis not present

## 2023-10-21 DIAGNOSIS — M6281 Muscle weakness (generalized): Secondary | ICD-10-CM | POA: Diagnosis not present

## 2023-10-28 DIAGNOSIS — R2681 Unsteadiness on feet: Secondary | ICD-10-CM | POA: Diagnosis not present

## 2023-10-28 DIAGNOSIS — M6281 Muscle weakness (generalized): Secondary | ICD-10-CM | POA: Diagnosis not present

## 2023-10-29 ENCOUNTER — Other Ambulatory Visit (HOSPITAL_BASED_OUTPATIENT_CLINIC_OR_DEPARTMENT_OTHER): Payer: Self-pay

## 2023-10-30 DIAGNOSIS — R2681 Unsteadiness on feet: Secondary | ICD-10-CM | POA: Diagnosis not present

## 2023-10-30 DIAGNOSIS — M6281 Muscle weakness (generalized): Secondary | ICD-10-CM | POA: Diagnosis not present

## 2023-11-05 DIAGNOSIS — R2681 Unsteadiness on feet: Secondary | ICD-10-CM | POA: Diagnosis not present

## 2023-11-05 DIAGNOSIS — M6281 Muscle weakness (generalized): Secondary | ICD-10-CM | POA: Diagnosis not present

## 2023-11-10 DIAGNOSIS — M6281 Muscle weakness (generalized): Secondary | ICD-10-CM | POA: Diagnosis not present

## 2023-11-10 DIAGNOSIS — R2681 Unsteadiness on feet: Secondary | ICD-10-CM | POA: Diagnosis not present

## 2023-11-12 DIAGNOSIS — M6281 Muscle weakness (generalized): Secondary | ICD-10-CM | POA: Diagnosis not present

## 2023-11-12 DIAGNOSIS — R2681 Unsteadiness on feet: Secondary | ICD-10-CM | POA: Diagnosis not present

## 2023-11-18 DIAGNOSIS — M6281 Muscle weakness (generalized): Secondary | ICD-10-CM | POA: Diagnosis not present

## 2023-11-18 DIAGNOSIS — R2681 Unsteadiness on feet: Secondary | ICD-10-CM | POA: Diagnosis not present

## 2023-11-19 ENCOUNTER — Encounter: Payer: Self-pay | Admitting: Neurology

## 2023-11-20 ENCOUNTER — Encounter: Payer: Self-pay | Admitting: Neurology

## 2023-11-20 DIAGNOSIS — M6281 Muscle weakness (generalized): Secondary | ICD-10-CM | POA: Diagnosis not present

## 2023-11-20 DIAGNOSIS — R7989 Other specified abnormal findings of blood chemistry: Secondary | ICD-10-CM | POA: Diagnosis not present

## 2023-11-20 DIAGNOSIS — R2681 Unsteadiness on feet: Secondary | ICD-10-CM | POA: Diagnosis not present

## 2023-11-23 DIAGNOSIS — R2681 Unsteadiness on feet: Secondary | ICD-10-CM | POA: Diagnosis not present

## 2023-11-23 DIAGNOSIS — M6281 Muscle weakness (generalized): Secondary | ICD-10-CM | POA: Diagnosis not present

## 2023-11-27 DIAGNOSIS — H1033 Unspecified acute conjunctivitis, bilateral: Secondary | ICD-10-CM | POA: Diagnosis not present

## 2023-11-28 ENCOUNTER — Other Ambulatory Visit (HOSPITAL_BASED_OUTPATIENT_CLINIC_OR_DEPARTMENT_OTHER): Payer: Self-pay

## 2023-11-30 ENCOUNTER — Other Ambulatory Visit (HOSPITAL_BASED_OUTPATIENT_CLINIC_OR_DEPARTMENT_OTHER): Payer: Self-pay

## 2023-12-01 ENCOUNTER — Other Ambulatory Visit (HOSPITAL_COMMUNITY): Payer: Self-pay

## 2023-12-01 ENCOUNTER — Other Ambulatory Visit (HOSPITAL_BASED_OUTPATIENT_CLINIC_OR_DEPARTMENT_OTHER): Payer: Self-pay

## 2023-12-01 DIAGNOSIS — M6281 Muscle weakness (generalized): Secondary | ICD-10-CM | POA: Diagnosis not present

## 2023-12-01 DIAGNOSIS — R2681 Unsteadiness on feet: Secondary | ICD-10-CM | POA: Diagnosis not present

## 2023-12-04 DIAGNOSIS — M6281 Muscle weakness (generalized): Secondary | ICD-10-CM | POA: Diagnosis not present

## 2023-12-04 DIAGNOSIS — R2681 Unsteadiness on feet: Secondary | ICD-10-CM | POA: Diagnosis not present

## 2023-12-10 DIAGNOSIS — M6281 Muscle weakness (generalized): Secondary | ICD-10-CM | POA: Diagnosis not present

## 2023-12-10 DIAGNOSIS — R2681 Unsteadiness on feet: Secondary | ICD-10-CM | POA: Diagnosis not present

## 2023-12-17 DIAGNOSIS — R2681 Unsteadiness on feet: Secondary | ICD-10-CM | POA: Diagnosis not present

## 2023-12-17 DIAGNOSIS — M6281 Muscle weakness (generalized): Secondary | ICD-10-CM | POA: Diagnosis not present

## 2023-12-19 ENCOUNTER — Other Ambulatory Visit (HOSPITAL_BASED_OUTPATIENT_CLINIC_OR_DEPARTMENT_OTHER): Payer: Self-pay

## 2023-12-21 ENCOUNTER — Other Ambulatory Visit (HOSPITAL_BASED_OUTPATIENT_CLINIC_OR_DEPARTMENT_OTHER): Payer: Self-pay

## 2023-12-21 DIAGNOSIS — R2681 Unsteadiness on feet: Secondary | ICD-10-CM | POA: Diagnosis not present

## 2023-12-21 DIAGNOSIS — M6281 Muscle weakness (generalized): Secondary | ICD-10-CM | POA: Diagnosis not present

## 2023-12-21 MED ORDER — MIRTAZAPINE 30 MG PO TABS
30.0000 mg | ORAL_TABLET | Freq: Every day | ORAL | 0 refills | Status: DC
  Filled 2023-12-21: qty 90, 90d supply, fill #0

## 2023-12-22 ENCOUNTER — Other Ambulatory Visit (HOSPITAL_BASED_OUTPATIENT_CLINIC_OR_DEPARTMENT_OTHER): Payer: Self-pay

## 2023-12-24 DIAGNOSIS — M6281 Muscle weakness (generalized): Secondary | ICD-10-CM | POA: Diagnosis not present

## 2023-12-24 DIAGNOSIS — R2681 Unsteadiness on feet: Secondary | ICD-10-CM | POA: Diagnosis not present

## 2023-12-29 ENCOUNTER — Other Ambulatory Visit (HOSPITAL_BASED_OUTPATIENT_CLINIC_OR_DEPARTMENT_OTHER): Payer: Self-pay

## 2023-12-29 DIAGNOSIS — M6281 Muscle weakness (generalized): Secondary | ICD-10-CM | POA: Diagnosis not present

## 2023-12-29 DIAGNOSIS — R2681 Unsteadiness on feet: Secondary | ICD-10-CM | POA: Diagnosis not present

## 2023-12-29 MED ORDER — LINZESS 290 MCG PO CAPS
ORAL_CAPSULE | ORAL | 3 refills | Status: AC
  Filled 2023-12-29: qty 90, 90d supply, fill #0
  Filled 2024-03-25: qty 90, 90d supply, fill #1
  Filled 2024-06-22: qty 90, 90d supply, fill #2
  Filled 2024-09-13: qty 90, 90d supply, fill #3

## 2023-12-30 ENCOUNTER — Other Ambulatory Visit (HOSPITAL_BASED_OUTPATIENT_CLINIC_OR_DEPARTMENT_OTHER): Payer: Self-pay

## 2023-12-31 DIAGNOSIS — R2681 Unsteadiness on feet: Secondary | ICD-10-CM | POA: Diagnosis not present

## 2023-12-31 DIAGNOSIS — M6281 Muscle weakness (generalized): Secondary | ICD-10-CM | POA: Diagnosis not present

## 2024-01-04 ENCOUNTER — Other Ambulatory Visit (HOSPITAL_BASED_OUTPATIENT_CLINIC_OR_DEPARTMENT_OTHER): Payer: Self-pay

## 2024-01-05 ENCOUNTER — Other Ambulatory Visit (HOSPITAL_BASED_OUTPATIENT_CLINIC_OR_DEPARTMENT_OTHER): Payer: Self-pay

## 2024-01-05 ENCOUNTER — Other Ambulatory Visit: Payer: Self-pay

## 2024-01-05 MED ORDER — MIRTAZAPINE 30 MG PO TABS
30.0000 mg | ORAL_TABLET | Freq: Every day | ORAL | 0 refills | Status: DC
  Filled 2024-01-05 (×2): qty 90, 90d supply, fill #0
  Filled 2024-01-05: qty 30, 30d supply, fill #0

## 2024-01-12 DIAGNOSIS — M6281 Muscle weakness (generalized): Secondary | ICD-10-CM | POA: Diagnosis not present

## 2024-01-12 DIAGNOSIS — R2681 Unsteadiness on feet: Secondary | ICD-10-CM | POA: Diagnosis not present

## 2024-01-14 DIAGNOSIS — R2681 Unsteadiness on feet: Secondary | ICD-10-CM | POA: Diagnosis not present

## 2024-01-14 DIAGNOSIS — M6281 Muscle weakness (generalized): Secondary | ICD-10-CM | POA: Diagnosis not present

## 2024-01-21 DIAGNOSIS — M6281 Muscle weakness (generalized): Secondary | ICD-10-CM | POA: Diagnosis not present

## 2024-01-21 DIAGNOSIS — R2681 Unsteadiness on feet: Secondary | ICD-10-CM | POA: Diagnosis not present

## 2024-01-27 DIAGNOSIS — M6281 Muscle weakness (generalized): Secondary | ICD-10-CM | POA: Diagnosis not present

## 2024-01-27 DIAGNOSIS — R2681 Unsteadiness on feet: Secondary | ICD-10-CM | POA: Diagnosis not present

## 2024-01-28 DIAGNOSIS — R2681 Unsteadiness on feet: Secondary | ICD-10-CM | POA: Diagnosis not present

## 2024-01-28 DIAGNOSIS — M6281 Muscle weakness (generalized): Secondary | ICD-10-CM | POA: Diagnosis not present

## 2024-01-30 ENCOUNTER — Other Ambulatory Visit (HOSPITAL_COMMUNITY): Payer: Self-pay

## 2024-02-01 ENCOUNTER — Other Ambulatory Visit: Payer: Self-pay

## 2024-02-02 ENCOUNTER — Other Ambulatory Visit: Payer: Self-pay

## 2024-02-02 ENCOUNTER — Other Ambulatory Visit (HOSPITAL_COMMUNITY): Payer: Self-pay

## 2024-02-03 DIAGNOSIS — R2681 Unsteadiness on feet: Secondary | ICD-10-CM | POA: Diagnosis not present

## 2024-02-03 DIAGNOSIS — M6281 Muscle weakness (generalized): Secondary | ICD-10-CM | POA: Diagnosis not present

## 2024-02-05 ENCOUNTER — Other Ambulatory Visit (HOSPITAL_BASED_OUTPATIENT_CLINIC_OR_DEPARTMENT_OTHER): Payer: Self-pay

## 2024-02-10 DIAGNOSIS — M6281 Muscle weakness (generalized): Secondary | ICD-10-CM | POA: Diagnosis not present

## 2024-02-10 DIAGNOSIS — R2681 Unsteadiness on feet: Secondary | ICD-10-CM | POA: Diagnosis not present

## 2024-02-12 ENCOUNTER — Other Ambulatory Visit (HOSPITAL_COMMUNITY): Payer: Self-pay

## 2024-02-12 DIAGNOSIS — M6281 Muscle weakness (generalized): Secondary | ICD-10-CM | POA: Diagnosis not present

## 2024-02-12 DIAGNOSIS — R2681 Unsteadiness on feet: Secondary | ICD-10-CM | POA: Diagnosis not present

## 2024-02-15 ENCOUNTER — Other Ambulatory Visit (HOSPITAL_BASED_OUTPATIENT_CLINIC_OR_DEPARTMENT_OTHER): Payer: Self-pay

## 2024-02-15 ENCOUNTER — Other Ambulatory Visit: Payer: Self-pay

## 2024-02-15 DIAGNOSIS — F419 Anxiety disorder, unspecified: Secondary | ICD-10-CM | POA: Diagnosis not present

## 2024-02-15 DIAGNOSIS — F331 Major depressive disorder, recurrent, moderate: Secondary | ICD-10-CM | POA: Diagnosis not present

## 2024-02-15 MED ORDER — MIRTAZAPINE 30 MG PO TABS
30.0000 mg | ORAL_TABLET | Freq: Every day | ORAL | 0 refills | Status: AC
  Filled 2024-02-15 – 2024-03-25 (×2): qty 90, 90d supply, fill #0

## 2024-02-15 MED ORDER — FLUOXETINE HCL 20 MG PO CAPS
20.0000 mg | ORAL_CAPSULE | Freq: Every morning | ORAL | 1 refills | Status: DC
Start: 2024-02-15 — End: 2024-07-28
  Filled 2024-02-15: qty 90, 90d supply, fill #0
  Filled 2024-04-27: qty 90, 90d supply, fill #1

## 2024-02-15 MED ORDER — FLUOXETINE HCL 40 MG PO CAPS
40.0000 mg | ORAL_CAPSULE | Freq: Every morning | ORAL | 1 refills | Status: DC
  Filled 2024-02-15 – 2024-03-25 (×2): qty 90, 90d supply, fill #0
  Filled 2024-06-22: qty 90, 90d supply, fill #1

## 2024-02-18 DIAGNOSIS — M6281 Muscle weakness (generalized): Secondary | ICD-10-CM | POA: Diagnosis not present

## 2024-02-18 DIAGNOSIS — R2681 Unsteadiness on feet: Secondary | ICD-10-CM | POA: Diagnosis not present

## 2024-02-25 DIAGNOSIS — R2681 Unsteadiness on feet: Secondary | ICD-10-CM | POA: Diagnosis not present

## 2024-02-25 DIAGNOSIS — M6281 Muscle weakness (generalized): Secondary | ICD-10-CM | POA: Diagnosis not present

## 2024-03-17 ENCOUNTER — Other Ambulatory Visit (HOSPITAL_BASED_OUTPATIENT_CLINIC_OR_DEPARTMENT_OTHER): Payer: Self-pay

## 2024-03-25 ENCOUNTER — Other Ambulatory Visit: Payer: Self-pay

## 2024-03-25 ENCOUNTER — Other Ambulatory Visit (HOSPITAL_BASED_OUTPATIENT_CLINIC_OR_DEPARTMENT_OTHER): Payer: Self-pay

## 2024-03-26 ENCOUNTER — Other Ambulatory Visit (HOSPITAL_BASED_OUTPATIENT_CLINIC_OR_DEPARTMENT_OTHER): Payer: Self-pay

## 2024-04-27 ENCOUNTER — Other Ambulatory Visit (HOSPITAL_COMMUNITY): Payer: Self-pay

## 2024-04-28 ENCOUNTER — Other Ambulatory Visit (HOSPITAL_BASED_OUTPATIENT_CLINIC_OR_DEPARTMENT_OTHER): Payer: Self-pay

## 2024-05-13 ENCOUNTER — Ambulatory Visit: Payer: PPO | Admitting: Neurology

## 2024-05-13 ENCOUNTER — Encounter: Payer: Self-pay | Admitting: Neurology

## 2024-05-13 ENCOUNTER — Other Ambulatory Visit (HOSPITAL_BASED_OUTPATIENT_CLINIC_OR_DEPARTMENT_OTHER): Payer: Self-pay

## 2024-05-13 VITALS — BP 95/59 | HR 74 | Ht 63.0 in | Wt 117.0 lb

## 2024-05-13 DIAGNOSIS — G40109 Localization-related (focal) (partial) symptomatic epilepsy and epileptic syndromes with simple partial seizures, not intractable, without status epilepticus: Secondary | ICD-10-CM | POA: Diagnosis not present

## 2024-05-13 MED ORDER — LAMOTRIGINE 200 MG PO TABS
200.0000 mg | ORAL_TABLET | Freq: Two times a day (BID) | ORAL | 4 refills | Status: AC
  Filled 2024-05-13 – 2024-07-26 (×2): qty 180, 90d supply, fill #0
  Filled 2024-10-24: qty 180, 90d supply, fill #1

## 2024-05-13 MED ORDER — LEVETIRACETAM 1000 MG PO TABS
2000.0000 mg | ORAL_TABLET | Freq: Two times a day (BID) | ORAL | 4 refills | Status: AC
  Filled 2024-05-13 – 2024-07-26 (×2): qty 360, 90d supply, fill #0
  Filled 2024-10-24: qty 360, 90d supply, fill #1

## 2024-05-13 MED ORDER — LORAZEPAM 1 MG PO TABS
ORAL_TABLET | ORAL | 5 refills | Status: AC
  Filled 2024-05-13: qty 10, 10d supply, fill #0

## 2024-05-13 NOTE — Patient Instructions (Signed)
 Good to see you doing well. Continue all your medications. Follow-up in 8 months, call for any changes.   Seizure Precautions: 1. If medication has been prescribed for you to prevent seizures, take it exactly as directed.  Do not stop taking the medicine without talking to your doctor first, even if you have not had a seizure in a long time.   2. Avoid activities in which a seizure would cause danger to yourself or to others.  Don't operate dangerous machinery, swim alone, or climb in high or dangerous places, such as on ladders, roofs, or girders.  Do not drive unless your doctor says you may.  3. If you have any warning that you may have a seizure, lay down in a safe place where you can't hurt yourself.    4.  No driving for 6 months from last seizure, as per [place]  state law.   Please refer to the following link on the [employer]'s website for more information: http://www.epilepsyfoundation.org/answerplace/Social/driving/drivingu.cfm   5.  Maintain good sleep hygiene. Avoid alcohol   6.  Notify your neurology if you are planning pregnancy or if you become pregnant.  7.  Contact your doctor if you have any problems that may be related to the medicine you are taking.  8.  Call 911 and bring the patient back to the ED if:        A.  The seizure lasts longer than 5 minutes.       B.  The patient doesn't awaken shortly after the seizure  C.  The patient has new problems such as difficulty seeing, speaking or moving  D.  The patient was injured during the seizure  E.  The patient has a temperature over 102 F (39C)  F.  The patient vomited and now is having trouble breathing

## 2024-05-13 NOTE — Progress Notes (Signed)
 NEUROLOGY FOLLOW UP OFFICE NOTE  Ashley [name] [id number] June 13, 1976  HISTORY OF PRESENT ILLNESS: I had the pleasure of seeing Ashley [name] in follow-up in the neurology clinic on 05/13/2024.  The patient was last seen 7 months ago for seizures secondary to stroke. She is alone in the office today. Records and images were personally reviewed where available.  Since her last visit, she reports doing well seizure-free since 2019 on Lamotrigine  200mg  BID and Levetiracetam  2000mg  BID (1000mg  2 tabs BID), no side effects. She denies any staring/unresponsive episodes, gaps in time, olfactory/gustatory hallucinations, focal numbness/tingling/weakness, myoclonic jerks. Sometimes her right leg jerks when she is nervous, it stops once she takes herself out of the situation. She has headaches off and on, they are mild with no nausea/vomiting. Tylenol  is effective. No dizziness, vision changes, no falls. Sleep and mood are good. She has prn lorazepam  and has not needed any recently.    History on Initial Assessment 12/10/2016: This is a pleasant 48 yo RH woman with a history of left parietal ICH in 2016 with residual right foot drop, admitted February 14-15, 2018 for focal seizures. She had focal seizures in 2016 with EEG showing left PLEDs and has been on Keppra  1500mg  BID since then with no further focal motor seizures, however she would have occasional episodes around once a month where she could not get her words out, lasting 3-4 minutes. Sometimes she would get hot and have to sit but her speech would not be affected. On 11/27/16, she started having the same speech difficulties where she was making paraphasic errors, but it continued for 30 minutes and they decided to go to the ER. In the ER, she was noted to be aphasic, then as she was having a head CT, she suddenly had right gaze and head deviation, right arm and leg flexion and shaking for around 3 minutes. She was given IV Ativan  but her right leg continued  to shake in a rhythmic fashion. She was given additional Keppra  and started on Vimpat  with no further seizures, back to baseline in a couple of hours. She initially had some difficulties with the Vimpat  100mg  BID where she had hallucinations of hearing music or voices, but these have resolved over the past week. She denies any side effects on Keppra  1500mg  BID. She denies any dizziness, diplopia, gait instability. She has a right foot drop, denies any numbness/tingling on the right side but states it feels different that the other side. She denies any olfactory/gustatory hallucinations, myoclonic jerks, episodes of staring/unresponsiveness, gaps in time. She has not had any further episodes of paraphasic errors since her hospitalization. She had a mild diffuse headache for a couple of days but this has resolved. She denies any dizziness, dysarthria/dysphagia, neck/back pain, bowel/bladder dysfunction.   Records from her hospitalization in August 2016 were reviewed. She has been seeing neurologist Dr. Rosemarie for post-stroke and seizure care since then, last visit was in August 2017. She had a severe headache for several days in August 2016 2 weeks post-partum. She then developed right-sided weakness, aphasia, then a focal seizure in the hospital. She was found to have a large left parietal ICH with IVH, cerebral edema, obstructive hydrocephalus and subarachnoid hemorrhage. MRA and MRV were unremarkable. Concern was for cerebral cortical vein thrombosis not seen on MRV, however RCVS was also a consideration. She was treated with hypertonic saline and stabilized. She had a seizure with EEG showing left PLEDs. She was started on Keppra . Hypercoagulable labs were  normal. She was discharged to rehab and had another seizure on 06/01/16 with report of eyes rolling back and upper extremities shaking. Keppra  dose was increased. She had a repeat MRI brain in September 2016 with resolving subacute left parietal hematoma. There  was acute gyral swelling and T2 hyperintensity in the left hippocampal formation, left insula, and operculum, that resolved with repeat MRI 09/2015 showing resolution of these changes. She was initially having some headaches and low dose Topamax  was briefly added, then with resolution of headaches, this was tapered off. She had been dealing with post-stroke depression and symptoms had improved, however with limitations on driving again, she is starting to feel depressed again. She has three young children, the youngest is 19 months old. She was not happy with neuro rehab and has a private physical therapist coming weekly where she pushes herself and has made a ton of improvement. She does not like to wear her AFO and her husband did not notice any difference in gait with the AFO.    Epilepsy Risk Factors:  History of large left parietal hematoma with encephalomalacia. Otherwise she had a normal birth and early development.  There is no history of febrile convulsions, CNS infections such as meningitis/encephalitis, significant traumatic brain injury, neurosurgical procedures, or family history of seizures.   Diagnostic Data: I personally reviewed MRI brain 11/26/2016 which did not show any acute changes. There was left paramedian parietal encephalomalacia with extensive hemosiderin staining extending into the splenium of the corpus callosum, ex vacuo dilatation of the left lateral ventricle occipital horn, increased side of the temporal horn of the left lateral ventricle which may be due to decreased volume of the hippocampus. It is smaller in size with increased signal compared to the right.  EEG 11/27/16 showed mild left hemispheric slowing EEG 05/22/15: left hemisphere slowing, rare epileptiform discharges over the left anterior temporal region EEG 05/21/15: PLEDs on the left hemisphere, mainly involving the temporo-parietal region 43-hour ambulatory EEG in 08/2022 showed occasional left temporal slowing,  occasional left frontotemporal epileptiform discharges seen exclusively in sleep. No seizures captured. Episode of right-sided numbness and shaking (not seen on video) did not show EEG correlate.    Prior AEDs: Topamax , Vimpat , oxcarbazepine   PAST MEDICAL HISTORY: Past Medical History:  Diagnosis Date   Aphasia following nontraumatic intracerebral hemorrhage 05/28/2015   Left parietal ICH Aug 2016    Cerebral venous thrombosis of cortical vein    Contracture of muscle ankle and foot 06/11/2015   Right heel cord    Cytotoxic cerebral edema    Generalized anxiety disorder 05/28/2015   Hypotension 06/02/2015   ICH (intracerebral hemorrhage)    IVH (intraventricular hemorrhage)    Left-sided intracerebral hemorrhage 06/04/2015   Major depressive disorder 03/23/2017   Right spastic hemiparesis 05/28/2015   Left ICH   Seizure disorder, nonconvulsive, with status epilepticus    last seizure early 2021   Sepsis 06/03/2015   UTI (urinary tract infection) 06/03/2015    MEDICATIONS: Current Outpatient Medications on File Prior to Visit  Medication Sig Dispense Refill   aspirin  81 MG chewable tablet Chew 81 mg by mouth daily.     Cholecalciferol (VITAMIN D) 2000 units CAPS Take 2,000 Units by mouth daily.     FLUoxetine  (PROZAC ) 20 MG capsule Take 1 capsule (20 mg total) by mouth in the morning with 40 mg. 90 capsule 1   FLUoxetine  (PROZAC ) 40 MG capsule Take 1 capsule (40 mg total) by mouth every morning  (with 20mg  for total  of 60mg ) 90 capsule 1   lamoTRIgine  (LAMICTAL ) 200 MG tablet Take 1 tablet (200 mg total) by mouth 2 (two) times daily. 180 tablet 4   levETIRAcetam  (KEPPRA ) 1000 MG tablet Take 2 tablets (2,000 mg total) by mouth 2 (two) times daily. 360 tablet 4   linaclotide  (LINZESS ) 290 MCG CAPS capsule TAKE 1 CAPSULE BY MOUTH EVERY DAY 30 MINUTES BEFORE FIRST MEAL OF THE DAY ON AN EMPTY STOMACH 90 capsule 3   LORazepam  (ATIVAN ) 1 MG tablet TAKE 1 TABLET BY MOUTH AS NEEDED FOR SEIZURES  10 tablet 5   magnesium  oxide (MAG-OX) 400 MG tablet Take 400 mg by mouth 2 (two) times daily.     mirtazapine  (REMERON ) 30 MG tablet Take 1 tablet (30 mg total) by mouth at bedtime. 90 tablet 0   Multiple Vitamin (MULTIVITAMIN) tablet Take 1 tablet by mouth daily.      FLUoxetine  (PROZAC ) 40 MG capsule Take 1 capsule (40 mg total) by mouth in the morning. Take with 20 mg capsule for total of 60 mg. 90 capsule 1   hydrocortisone  2.5 % cream Mix with ketoconazole  cream and apply twice a day as needed for rash at corner of mouth. 30 g 11   hydrOXYzine  (ATARAX ) 25 MG tablet Take 1-2 tablet (25 mg total) by mouth daily as needed for anxiety. 60 tablet 1   ketoconazole  (NIZORAL ) 2 % cream Mix with hydrocortisone  cream and apply twice a day as needed for rash at corner of mouth. 60 g 11   linaclotide  (LINZESS ) 145 MCG CAPS capsule Take 290 mcg by mouth daily before breakfast.     metroNIDAZOLE  (METROCREAM ) 0.75 % cream Apply topically daily. 45 g 3   mirtazapine  (REMERON ) 30 MG tablet      pimecrolimus  (ELIDEL ) 1 % cream Apply topically 2 (two) times daily. 30 g 6   tacrolimus  (PROTOPIC ) 0.1 % ointment Apply topically at bedtime. 90 g 5   tacrolimus  (PROTOPIC ) 0.1 % ointment Apply a thin layer topically to the face every night at bedtime. 100 g 6   tretinoin  (RETIN-A ) 0.025 % cream Apply topically daily. 20 g 11   No current facility-administered medications on file prior to visit.    ALLERGIES: No Known Allergies  FAMILY HISTORY: Family History  Problem Relation Age of Onset   Cancer Mother    Cancer Father        pancreatic   Colon cancer Neg Hx    Colon polyps Neg Hx    Esophageal cancer Neg Hx    Rectal cancer Neg Hx    Stomach cancer Neg Hx     SOCIAL HISTORY: Social History   Socioeconomic History   Marital status: Married    Spouse name: Not on file   Number of children: Not on file   Years of education: 16   Highest education level: Bachelor's degree (e.g., BA, AB, BS)   Occupational History   Not on file  Tobacco Use   Smoking status: Never   Smokeless tobacco: Never  Vaping Use   Vaping status: Never Used  Substance and Sexual Activity   Alcohol  use: Yes    Comment: wine 1-2 times a month per pt   Drug use: No   Sexual activity: Yes    Birth control/protection: None    Comment: husband has vasectomy  Other Topics Concern   Not on file  Social History Narrative   ** Merged History Encounter ** Lives with dog, mother and three kids in two  story home       Right handed      Highest level of edu- bachelors   Social Drivers of Health   Financial Resource Strain: Not on file  Food Insecurity: No Food Insecurity (02/27/2023)   Received from [facility]   Hunger Vital Sign    Within the past 12 months, you worried that your food would run out before you got the money to buy more.: Never true    Within the past 12 months, the food you bought just didn't last and you didn't have money to get more.: Never true  Transportation Needs: No Transportation Needs (02/27/2023)   Received from [facility] - Transportation    Lack of Transportation (Medical): No    Lack of Transportation (Non-Medical): No  Physical Activity: Not on file  Stress: Stress Concern Present (02/27/2023)   Received from [facility] - Occupational Stress Questionnaire    Feeling of Stress : To some extent  Social Connections: Unknown (07/21/2022)   Received from [facility]   Social Network    Social Network: Not on file  Intimate Partner Violence: Not At Risk (02/27/2023)   Received from [facility]   HITS    Over the last 12 months how often did your partner physically hurt you?: Never    Over the last 12 months how often did your partner insult you or talk down to you?: Never    Over the last 12 months how often did your partner threaten you with physical harm?: Never    Over the last 12 months how often did your  partner scream or curse at you?: Never     PHYSICAL EXAM: Vitals:   05/13/24 1414  BP: (!) 95/59  Pulse: 74  SpO2: 100%   General: No acute distress Head:  Normocephalic/atraumatic Skin/Extremities: No rash, no edema Neurological Exam: alert and awake. No aphasia or dysarthria. Fund of knowledge is appropriate.  Attention and concentration are normal.   Cranial nerves: Pupils equal, round. Extraocular movements intact with no nystagmus. Visual fields full.  No facial asymmetry.  Motor: Bulk and tone normal, muscle strength 5/5 throughout, right ankle in AFO. Finger to nose testing intact.  Gait spastic hemiparetic gait with AFO, no ataxia. No tremors.    IMPRESSION: This is a pleasant 48 yo RH woman with a history of large left ICH and focal seizures in August 2016 with residual right foot drop. No GTCs since 2019. She is doing well on Levetiracetam  2000mg  BID and Lamotrigine  200mg  BID. She has prn lorazepam  for seizure rescue but has not needed it, refills will be sent. She is aware of [place] driving laws to stop driving after a seizure until 6 months seizure-free. She will keep a calendar of her headaches. Follow-up in 8 months, call for any changes.   Thank you for allowing me to participate in her care.  Please do not hesitate to call for any questions or concerns.    Darice Shivers, M.D.   CC: Dr. Loreli

## 2024-06-21 DIAGNOSIS — Z23 Encounter for immunization: Secondary | ICD-10-CM | POA: Diagnosis not present

## 2024-06-21 DIAGNOSIS — K429 Umbilical hernia without obstruction or gangrene: Secondary | ICD-10-CM | POA: Diagnosis not present

## 2024-06-22 ENCOUNTER — Other Ambulatory Visit (HOSPITAL_COMMUNITY): Payer: Self-pay

## 2024-06-23 ENCOUNTER — Other Ambulatory Visit (HOSPITAL_COMMUNITY): Payer: Self-pay

## 2024-06-23 ENCOUNTER — Other Ambulatory Visit: Payer: Self-pay

## 2024-06-29 ENCOUNTER — Other Ambulatory Visit (HOSPITAL_COMMUNITY): Payer: Self-pay

## 2024-07-01 ENCOUNTER — Other Ambulatory Visit (HOSPITAL_COMMUNITY): Payer: Self-pay

## 2024-07-04 ENCOUNTER — Other Ambulatory Visit (HOSPITAL_COMMUNITY): Payer: Self-pay

## 2024-07-04 MED ORDER — MIRTAZAPINE 30 MG PO TABS
ORAL_TABLET | ORAL | 0 refills | Status: DC
  Filled 2024-07-04 – 2024-07-06 (×2): qty 90, 90d supply, fill #0

## 2024-07-05 ENCOUNTER — Other Ambulatory Visit (HOSPITAL_COMMUNITY): Payer: Self-pay

## 2024-07-06 ENCOUNTER — Other Ambulatory Visit: Payer: Self-pay

## 2024-07-06 ENCOUNTER — Other Ambulatory Visit (HOSPITAL_BASED_OUTPATIENT_CLINIC_OR_DEPARTMENT_OTHER): Payer: Self-pay

## 2024-07-07 ENCOUNTER — Encounter: Payer: Self-pay | Admitting: Neurology

## 2024-07-19 DIAGNOSIS — K439 Ventral hernia without obstruction or gangrene: Secondary | ICD-10-CM | POA: Diagnosis not present

## 2024-07-20 DIAGNOSIS — K429 Umbilical hernia without obstruction or gangrene: Secondary | ICD-10-CM | POA: Diagnosis not present

## 2024-07-20 DIAGNOSIS — Z1330 Encounter for screening examination for mental health and behavioral disorders, unspecified: Secondary | ICD-10-CM | POA: Diagnosis not present

## 2024-07-26 ENCOUNTER — Other Ambulatory Visit: Payer: Self-pay

## 2024-07-26 ENCOUNTER — Other Ambulatory Visit (HOSPITAL_COMMUNITY): Payer: Self-pay

## 2024-07-28 ENCOUNTER — Other Ambulatory Visit (HOSPITAL_COMMUNITY): Payer: Self-pay

## 2024-07-28 MED ORDER — FLUOXETINE HCL 20 MG PO CAPS
20.0000 mg | ORAL_CAPSULE | Freq: Every morning | ORAL | 0 refills | Status: DC
  Filled 2024-07-28 – 2024-07-29 (×2): qty 90, 90d supply, fill #0

## 2024-07-29 ENCOUNTER — Other Ambulatory Visit (HOSPITAL_COMMUNITY): Payer: Self-pay

## 2024-08-01 ENCOUNTER — Other Ambulatory Visit (HOSPITAL_COMMUNITY): Payer: Self-pay

## 2024-08-15 DIAGNOSIS — F419 Anxiety disorder, unspecified: Secondary | ICD-10-CM | POA: Diagnosis not present

## 2024-08-15 DIAGNOSIS — F331 Major depressive disorder, recurrent, moderate: Secondary | ICD-10-CM | POA: Diagnosis not present

## 2024-08-25 DIAGNOSIS — F419 Anxiety disorder, unspecified: Secondary | ICD-10-CM | POA: Diagnosis not present

## 2024-08-25 DIAGNOSIS — F32A Depression, unspecified: Secondary | ICD-10-CM | POA: Diagnosis not present

## 2024-08-25 DIAGNOSIS — Z79899 Other long term (current) drug therapy: Secondary | ICD-10-CM | POA: Diagnosis not present

## 2024-08-25 DIAGNOSIS — Z8673 Personal history of transient ischemic attack (TIA), and cerebral infarction without residual deficits: Secondary | ICD-10-CM | POA: Diagnosis not present

## 2024-08-25 DIAGNOSIS — Z7982 Long term (current) use of aspirin: Secondary | ICD-10-CM | POA: Diagnosis not present

## 2024-08-25 DIAGNOSIS — K420 Umbilical hernia with obstruction, without gangrene: Secondary | ICD-10-CM | POA: Diagnosis not present

## 2024-08-26 DIAGNOSIS — K429 Umbilical hernia without obstruction or gangrene: Secondary | ICD-10-CM | POA: Diagnosis not present

## 2024-08-30 ENCOUNTER — Encounter: Payer: Self-pay | Admitting: Neurology

## 2024-09-02 DIAGNOSIS — Z4889 Encounter for other specified surgical aftercare: Secondary | ICD-10-CM | POA: Diagnosis not present

## 2024-09-07 DIAGNOSIS — Z1231 Encounter for screening mammogram for malignant neoplasm of breast: Secondary | ICD-10-CM | POA: Diagnosis not present

## 2024-09-12 DIAGNOSIS — Z4889 Encounter for other specified surgical aftercare: Secondary | ICD-10-CM | POA: Diagnosis not present

## 2024-09-13 ENCOUNTER — Other Ambulatory Visit (HOSPITAL_COMMUNITY): Payer: Self-pay

## 2024-09-14 ENCOUNTER — Other Ambulatory Visit: Payer: Self-pay

## 2024-09-14 ENCOUNTER — Other Ambulatory Visit (HOSPITAL_COMMUNITY): Payer: Self-pay

## 2024-09-14 MED ORDER — MIRTAZAPINE 30 MG PO TABS
30.0000 mg | ORAL_TABLET | Freq: Every day | ORAL | 0 refills | Status: AC
  Filled 2024-09-14: qty 90, 90d supply, fill #0

## 2024-09-14 MED ORDER — FLUOXETINE HCL 40 MG PO CAPS
ORAL_CAPSULE | ORAL | 0 refills | Status: AC
  Filled 2024-09-14: qty 90, 90d supply, fill #0

## 2024-10-24 ENCOUNTER — Other Ambulatory Visit (HOSPITAL_COMMUNITY): Payer: Self-pay

## 2024-11-10 ENCOUNTER — Other Ambulatory Visit (HOSPITAL_COMMUNITY): Payer: Self-pay

## 2024-11-11 ENCOUNTER — Other Ambulatory Visit: Payer: Self-pay

## 2024-11-11 ENCOUNTER — Other Ambulatory Visit (HOSPITAL_COMMUNITY): Payer: Self-pay

## 2024-11-11 MED ORDER — FLUOXETINE HCL 20 MG PO CAPS
20.0000 mg | ORAL_CAPSULE | Freq: Every morning | ORAL | 0 refills | Status: AC
  Filled 2024-11-11: qty 90, 90d supply, fill #0

## 2025-01-13 ENCOUNTER — Ambulatory Visit: Admitting: Neurology

## 2025-01-30 ENCOUNTER — Ambulatory Visit: Admitting: Neurology

## 2025-01-31 ENCOUNTER — Ambulatory Visit: Admitting: Neurology
